# Patient Record
Sex: Male | Born: 1955
Health system: Southern US, Community
[De-identification: ages and names within clinical notes are randomized; demographics above are authoritative.]

## PROBLEM LIST (undated history)

## (undated) DIAGNOSIS — Z9119 Patient's noncompliance with other medical treatment and regimen: Secondary | ICD-10-CM

## (undated) DIAGNOSIS — Z91199 Patient's noncompliance with other medical treatment and regimen due to unspecified reason: Secondary | ICD-10-CM

## (undated) DIAGNOSIS — M1990 Unspecified osteoarthritis, unspecified site: Secondary | ICD-10-CM

## (undated) DIAGNOSIS — I10 Essential (primary) hypertension: Secondary | ICD-10-CM

## (undated) DIAGNOSIS — K648 Other hemorrhoids: Secondary | ICD-10-CM

## (undated) DIAGNOSIS — I2510 Atherosclerotic heart disease of native coronary artery without angina pectoris: Secondary | ICD-10-CM

## (undated) DIAGNOSIS — E119 Type 2 diabetes mellitus without complications: Secondary | ICD-10-CM

## (undated) DIAGNOSIS — H269 Unspecified cataract: Secondary | ICD-10-CM

## (undated) DIAGNOSIS — E7800 Pure hypercholesterolemia, unspecified: Secondary | ICD-10-CM

## (undated) DIAGNOSIS — H409 Unspecified glaucoma: Secondary | ICD-10-CM

## (undated) HISTORY — DX: Unspecified osteoarthritis, unspecified site: M19.90

## (undated) HISTORY — DX: Unspecified cataract: H26.9

## (undated) HISTORY — PX: EYE SURGERY: SHX253

## (undated) HISTORY — DX: Atherosclerotic heart disease of native coronary artery without angina pectoris: I25.10

---

## 1988-08-18 DIAGNOSIS — E119 Type 2 diabetes mellitus without complications: Secondary | ICD-10-CM

## 1988-08-18 HISTORY — DX: Type 2 diabetes mellitus without complications: E11.9

## 1998-03-16 ENCOUNTER — Other Ambulatory Visit: Admission: RE | Admit: 1998-03-16 | Discharge: 1998-03-16 | Payer: Self-pay | Admitting: Family Medicine

## 1998-07-04 ENCOUNTER — Ambulatory Visit (HOSPITAL_COMMUNITY): Admission: RE | Admit: 1998-07-04 | Discharge: 1998-07-04 | Payer: Self-pay | Admitting: Family Medicine

## 1998-07-04 ENCOUNTER — Encounter: Payer: Self-pay | Admitting: Family Medicine

## 1998-08-18 HISTORY — PX: COLONOSCOPY: SHX174

## 1999-07-30 ENCOUNTER — Ambulatory Visit (HOSPITAL_COMMUNITY): Admission: RE | Admit: 1999-07-30 | Discharge: 1999-07-30 | Payer: Self-pay | Admitting: Gastroenterology

## 2000-03-27 ENCOUNTER — Emergency Department (HOSPITAL_COMMUNITY): Admission: EM | Admit: 2000-03-27 | Discharge: 2000-03-27 | Payer: Self-pay | Admitting: Emergency Medicine

## 2001-04-29 ENCOUNTER — Emergency Department (HOSPITAL_COMMUNITY): Admission: EM | Admit: 2001-04-29 | Discharge: 2001-04-29 | Payer: Self-pay | Admitting: Emergency Medicine

## 2003-03-31 ENCOUNTER — Encounter: Payer: Self-pay | Admitting: Internal Medicine

## 2003-03-31 ENCOUNTER — Ambulatory Visit (HOSPITAL_COMMUNITY): Admission: RE | Admit: 2003-03-31 | Discharge: 2003-03-31 | Payer: Self-pay | Admitting: Internal Medicine

## 2011-07-17 ENCOUNTER — Inpatient Hospital Stay (HOSPITAL_COMMUNITY)
Admission: EM | Admit: 2011-07-17 | Discharge: 2011-07-20 | DRG: 853 | Disposition: A | Discharge status: Home / Self Care | Payer: BC Managed Care – PPO | Attending: Cardiovascular Disease | Admitting: Cardiovascular Disease

## 2011-07-17 ENCOUNTER — Encounter: Payer: Self-pay | Admitting: *Deleted

## 2011-07-17 ENCOUNTER — Emergency Department (HOSPITAL_COMMUNITY): Payer: BC Managed Care – PPO

## 2011-07-17 DIAGNOSIS — Z8249 Family history of ischemic heart disease and other diseases of the circulatory system: Secondary | ICD-10-CM

## 2011-07-17 DIAGNOSIS — Z91199 Patient's noncompliance with other medical treatment and regimen due to unspecified reason: Secondary | ICD-10-CM

## 2011-07-17 DIAGNOSIS — I25118 Atherosclerotic heart disease of native coronary artery with other forms of angina pectoris: Secondary | ICD-10-CM | POA: Diagnosis present

## 2011-07-17 DIAGNOSIS — E1169 Type 2 diabetes mellitus with other specified complication: Secondary | ICD-10-CM | POA: Diagnosis present

## 2011-07-17 DIAGNOSIS — R739 Hyperglycemia, unspecified: Secondary | ICD-10-CM

## 2011-07-17 DIAGNOSIS — I200 Unstable angina: Secondary | ICD-10-CM

## 2011-07-17 DIAGNOSIS — R079 Chest pain, unspecified: Secondary | ICD-10-CM

## 2011-07-17 DIAGNOSIS — R599 Enlarged lymph nodes, unspecified: Secondary | ICD-10-CM | POA: Diagnosis present

## 2011-07-17 DIAGNOSIS — E785 Hyperlipidemia, unspecified: Secondary | ICD-10-CM | POA: Diagnosis present

## 2011-07-17 DIAGNOSIS — I10 Essential (primary) hypertension: Secondary | ICD-10-CM | POA: Diagnosis present

## 2011-07-17 DIAGNOSIS — R000 Tachycardia, unspecified: Secondary | ICD-10-CM | POA: Diagnosis present

## 2011-07-17 DIAGNOSIS — Z9119 Patient's noncompliance with other medical treatment and regimen: Secondary | ICD-10-CM

## 2011-07-17 DIAGNOSIS — I214 Non-ST elevation (NSTEMI) myocardial infarction: Secondary | ICD-10-CM | POA: Diagnosis present

## 2011-07-17 DIAGNOSIS — E118 Type 2 diabetes mellitus with unspecified complications: Secondary | ICD-10-CM | POA: Diagnosis present

## 2011-07-17 DIAGNOSIS — Z7982 Long term (current) use of aspirin: Secondary | ICD-10-CM

## 2011-07-17 DIAGNOSIS — E119 Type 2 diabetes mellitus without complications: Secondary | ICD-10-CM | POA: Diagnosis present

## 2011-07-17 DIAGNOSIS — I2510 Atherosclerotic heart disease of native coronary artery without angina pectoris: Secondary | ICD-10-CM | POA: Diagnosis present

## 2011-07-17 DIAGNOSIS — E7800 Pure hypercholesterolemia, unspecified: Secondary | ICD-10-CM | POA: Diagnosis present

## 2011-07-17 DIAGNOSIS — Z794 Long term (current) use of insulin: Secondary | ICD-10-CM | POA: Diagnosis present

## 2011-07-17 HISTORY — DX: Type 2 diabetes mellitus without complications: E11.9

## 2011-07-17 HISTORY — DX: Pure hypercholesterolemia, unspecified: E78.00

## 2011-07-17 HISTORY — DX: Essential (primary) hypertension: I10

## 2011-07-17 HISTORY — DX: Patient's noncompliance with other medical treatment and regimen due to unspecified reason: Z91.199

## 2011-07-17 HISTORY — DX: Other hemorrhoids: K64.8

## 2011-07-17 HISTORY — DX: Patient's noncompliance with other medical treatment and regimen: Z91.19

## 2011-07-17 LAB — CBC
Hemoglobin: 15.4 g/dL (ref 13.0–17.0)
Hemoglobin: 14.9 g/dL (ref 13.0–17.0)
MCH: 29.1 pg (ref 26.0–34.0)
MCH: 29.7 pg (ref 26.0–34.0)
MCV: 82.1 fL (ref 78.0–100.0)
RBC: 5.3 MIL/uL (ref 4.22–5.81)
RBC: 5.02 MIL/uL (ref 4.22–5.81)
WBC: 6.1 10*3/uL (ref 4.0–10.5)

## 2011-07-17 LAB — URINALYSIS, ROUTINE W REFLEX MICROSCOPIC
Glucose, UA: >1000 mg/dL — AB
Ketones, ur: 40 mg/dL — AB
Leukocytes, UA: NEGATIVE
Nitrite: NEGATIVE
Protein, ur: NEGATIVE mg/dL

## 2011-07-17 LAB — CARDIAC PANEL(CRET KIN+CKTOT+MB+TROPI)
CK, MB: 3.8 ng/mL (ref 0.3–4.0)
CK, MB: 3.5 ng/mL (ref 0.3–4.0)
Total CK: 52 U/L (ref 7–232)

## 2011-07-17 LAB — POCT I-STAT, CHEM 8
BUN: 13 mg/dL (ref 6–23)
Calcium, Ion: 1.1 mmol/L — ABNORMAL LOW (ref 1.12–1.32)
HCT: 49 % (ref 39.0–52.0)
Hemoglobin: 16.7 g/dL (ref 13.0–17.0)
Sodium: 133 mEq/L — ABNORMAL LOW (ref 135–145)
TCO2: 25 mmol/L (ref 0–100)

## 2011-07-17 LAB — GLUCOSE, CAPILLARY: Glucose-Capillary: 298 mg/dL — ABNORMAL HIGH (ref 70–99)

## 2011-07-17 LAB — POCT I-STAT TROPONIN I: Troponin i, poc: 0.03 ng/mL (ref 0.00–0.08)

## 2011-07-17 LAB — CREATININE, SERUM: Creatinine, Ser: 0.64 mg/dL (ref 0.50–1.35)

## 2011-07-17 LAB — PROTIME-INR: Prothrombin Time: 13.2 seconds (ref 11.6–15.2)

## 2011-07-17 LAB — APTT: aPTT: 28 seconds (ref 24–37)

## 2011-07-17 MED ORDER — NITROGLYCERIN 0.4 MG SL SUBL
0.4000 mg | SUBLINGUAL_TABLET | SUBLINGUAL | Status: DC | PRN
  Administered 2011-07-17: 0.4 mg via SUBLINGUAL
  Filled 2011-07-17: qty 75

## 2011-07-17 MED ORDER — INSULIN ASPART 100 UNIT/ML ~~LOC~~ SOLN
10.0000 [IU] | Freq: Once | SUBCUTANEOUS | Status: AC
  Administered 2011-07-17: 10 [IU] via SUBCUTANEOUS
  Filled 2011-07-17: qty 1

## 2011-07-17 MED ORDER — SODIUM CHLORIDE 0.9 % IV SOLN: INTRAVENOUS | Status: AC

## 2011-07-17 MED ORDER — MORPHINE SULFATE 2 MG/ML IJ SOLN
2.0000 mg | Freq: Once | INTRAMUSCULAR | Status: AC
  Administered 2011-07-17: 2 mg via INTRAVENOUS
  Filled 2011-07-17: qty 1

## 2011-07-17 MED ORDER — SODIUM CHLORIDE 0.9 % IJ SOLN: 3.0000 mL | Freq: Two times a day (BID) | INTRAMUSCULAR | Status: DC

## 2011-07-17 MED ORDER — SODIUM CHLORIDE 0.9 % IJ SOLN
3.0000 mL | INTRAMUSCULAR | Status: DC | PRN
  Administered 2011-07-17: 3 mL via INTRAVENOUS

## 2011-07-17 MED ORDER — MORPHINE SULFATE 2 MG/ML IJ SOLN
1.0000 mg | INTRAMUSCULAR | Status: DC | PRN
  Administered 2011-07-17: 1 mg via INTRAVENOUS
  Filled 2011-07-17: qty 1

## 2011-07-17 MED ORDER — ASPIRIN 81 MG PO CHEW
324.0000 mg | CHEWABLE_TABLET | Freq: Once | ORAL | Status: AC
  Administered 2011-07-17: 324 mg via ORAL
  Filled 2011-07-17: qty 4

## 2011-07-17 MED ORDER — INSULIN REGULAR HUMAN 100 UNIT/ML IJ SOLN
10.0000 [IU] | Freq: Once | INTRAMUSCULAR | Status: DC
  Filled 2011-07-17: qty 0.1

## 2011-07-17 MED ORDER — NITROGLYCERIN 2 % TD OINT
0.5000 [in_us] | TOPICAL_OINTMENT | Freq: Four times a day (QID) | TRANSDERMAL | Status: DC
  Administered 2011-07-17 – 2011-07-18 (×2): 0.5 [in_us] via TOPICAL
  Filled 2011-07-17: qty 1

## 2011-07-17 MED ORDER — INSULIN ASPART 100 UNIT/ML ~~LOC~~ SOLN
0.0000 [IU] | SUBCUTANEOUS | Status: DC
  Administered 2011-07-17: 3 [IU] via SUBCUTANEOUS
  Administered 2011-07-18: 5 [IU] via SUBCUTANEOUS
  Administered 2011-07-18: 3 [IU] via SUBCUTANEOUS

## 2011-07-17 MED ORDER — SODIUM CHLORIDE 0.9 % IV BOLUS (SEPSIS)
1000.0000 mL | Freq: Once | INTRAVENOUS | Status: AC
  Administered 2011-07-18: 1000 mL via INTRAVENOUS

## 2011-07-17 MED ORDER — ENOXAPARIN SODIUM 60 MG/0.6ML ~~LOC~~ SOLN
50.0000 mg | SUBCUTANEOUS | Status: DC
  Administered 2011-07-17: 50 mg via SUBCUTANEOUS
  Filled 2011-07-17 (×3): qty 0.6

## 2011-07-17 MED ORDER — ENOXAPARIN SODIUM 40 MG/0.4ML ~~LOC~~ SOLN: 40.0000 mg | SUBCUTANEOUS | Status: DC

## 2011-07-17 MED ORDER — SODIUM CHLORIDE 0.9 % IV SOLN: 250.0000 mL | INTRAVENOUS | Status: DC | PRN

## 2011-07-17 NOTE — ED Notes (Addendum)
B/P 131/84  HR 106  R 20  Pulse ox 99% on room air---sitting on end of bed talking with family members---Reports  Mild chest pain and rates it a 2 on 1-10 scale.  3rd NTG given

## 2011-07-17 NOTE — ED Notes (Signed)
B/P 170/102  HR  104  Rates chest pain a three on 1-10 scale

## 2011-07-17 NOTE — ED Notes (Signed)
Pt states "it goes across my chest, it comes & goes, goes up under my arm and into my neck"; pt indicates left arm

## 2011-07-17 NOTE — ED Notes (Signed)
B/P 115/94  HR 117  R 22  Pulse ox 96%--Again, reports complete resolution of chest pain

## 2011-07-17 NOTE — H&P (Signed)
Derek Lowery is an 55 y.o. male.   Chief Complaint: Chest Pain HPI:  Was working this morning at his Job at [employer] ([employer]) and started having pain. Started having the pain after an hour of work-Pain started in the chest "all across the chest" was a dull pain, was intermittent-would come all at once And then would ease Up. Didn't stop working and kept on having the pain. Worked through the pain from 04:00-->08:00. Pain was 7/10 in intensity. Went home and played [employer]. The pain did not seem to get better with rest at home  Tried to lay down and was still having pain-no position made it better  Pain radiated up into his neck/jaw and seemed also to go down his arm  Eating didn't make this worse, didn't make it better.  No syncope--no diaphoresis when he was having the pain  The pain seemed to be more intense and was lasting longer and was more and more  ROS=Polydipsia, Polyuric--blurred vision  Started meds in 1995  No weakness no slurred speech, no loc, no dysuria, no cough or cold, no recent h/o long distance travel. Has some polyneuropathy        Past Medical History  Diagnosis Date  . Hypercholesteremia     was taking meds-stopped 2 yr ago  . Diabetes mellitus 1990    non-compliant on meds--last check about 1 yr  . Chest pain radiating to jaw 11.29.12  . Angina     Past Surgical History  Procedure Date  . Shoulder surgery   . Colonoscopy 2000    Dr. Collene Mares found internal hemorrhoids    Family History  Problem Relation Age of Onset  . Coronary artery disease Father   . Kidney disease Father   . Diabetes Father   . Heart failure Mother   . Hypertension Mother   . Diabetes Sister   . Diabetes Brother   . Melanoma Father    Social History:  reports that he has never smoked. He does not have any smokeless tobacco history on file. He reports that he does not drink alcohol or use illicit drugs.  Allergies: No Known Allergies  Medications Prior to Admission    Medication Dose Route Frequency Provider Last Rate Last Dose  . sodium chloride 0.9 % injection 3 mL  3 mL Intravenous Q12H Charlena Cross, MD       And  . sodium chloride 0.9 % injection 3 mL  3 mL Intravenous PRN Charlena Cross, MD   3 mL at 07/17/11 1607   And  . 0.9 %  sodium chloride infusion  250 mL Intravenous PRN Charlena Cross, MD      . 0.9 %  sodium chloride infusion   Intravenous Continuous Verneita Griffes, MD      . aspirin chewable tablet 324 mg  324 mg Oral Once Charlena Cross, MD   324 mg at 07/17/11 1541  . insulin aspart (novoLOG) injection 10 Units  10 Units Subcutaneous Once Lolita Patella, [place]   10 Units at 07/17/11 1618  . morphine 2 MG/ML injection 2 mg  2 mg Intravenous Once Charlena Cross, MD   2 mg at 07/17/11 1616  . nitroGLYCERIN (NITROSTAT) SL tablet 0.4 mg  0.4 mg Sublingual Q5 min PRN Charlena Cross, MD   0.4 mg at 07/17/11 1608  . sodium chloride 0.9 % bolus 1,000 mL  1,000 mL Intravenous Once Charlena Cross, MD      .  DISCONTD: insulin regular (HUMULIN R,NOVOLIN R) 100 units/mL injection 10 Units  10 Units Subcutaneous Once Charlena Cross, MD       No current outpatient prescriptions on file as of 07/17/2011.    Results for orders placed during the hospital encounter of 07/17/11 (from the past 48 hour(s))  GLUCOSE, CAPILLARY     Status: Abnormal   Collection Time   07/17/11  2:25 PM      Component Value Range Comment   Glucose-Capillary 514 (*) 70 - 99 (mg/dL)    Comment 1 Documented in Chart      Comment 2 Notify RN     CBC     Status: Normal   Collection Time   07/17/11  3:30 PM      Component Value Range Comment   WBC 6.1  4.0 - 10.5 (K/uL)    RBC 5.30  4.22 - 5.81 (MIL/uL)    Hemoglobin 15.4  13.0 - 17.0 (g/dL)    HCT 43.5  39.0 - 52.0 (%)    MCV 82.1  78.0 - 100.0 (fL)    MCH 29.1  26.0 - 34.0 (pg)    MCHC 35.4  30.0 - 36.0 (g/dL)    RDW 12.4  11.5 - 15.5 (%)    Platelets 168  150 - 400 (K/uL)   PROTIME-INR      Status: Normal   Collection Time   07/17/11  3:30 PM      Component Value Range Comment   Prothrombin Time 13.2  11.6 - 15.2 (seconds)    INR 0.98  0.00 - 1.49    APTT     Status: Normal   Collection Time   07/17/11  3:30 PM      Component Value Range Comment   aPTT 28  24 - 37 (seconds)   POCT I-STAT TROPONIN I     Status: Normal   Collection Time   07/17/11  3:40 PM      Component Value Range Comment   Troponin i, poc 0.03  0.00 - 0.08 (ng/mL)    Comment 3            POCT I-STAT, CHEM 8     Status: Abnormal   Collection Time   07/17/11  3:42 PM      Component Value Range Comment   Sodium 133 (*) 135 - 145 (mEq/L)    Potassium 4.5  3.5 - 5.1 (mEq/L)    Chloride 98  96 - 112 (mEq/L)    BUN 13  6 - 23 (mg/dL)    Creatinine, Ser 0.90  0.50 - 1.35 (mg/dL)    Glucose, Bld 481 (*) 70 - 99 (mg/dL)    Calcium, Ion 1.10 (*) 1.12 - 1.32 (mmol/L)    TCO2 25  0 - 100 (mmol/L)    Hemoglobin 16.7  13.0 - 17.0 (g/dL)    HCT 49.0  39.0 - 52.0 (%)   URINALYSIS, ROUTINE W REFLEX MICROSCOPIC     Status: Abnormal   Collection Time   07/17/11  4:25 PM      Component Value Range Comment   Color, Urine YELLOW  YELLOW     APPearance CLEAR  CLEAR     Specific Gravity, Urine 1.029  1.005 - 1.030     pH 7.0  5.0 - 8.0     Glucose, UA >1000 (*) NEGATIVE (mg/dL)    Hgb urine dipstick NEGATIVE  NEGATIVE     Bilirubin Urine NEGATIVE  NEGATIVE  Ketones, ur 40 (*) NEGATIVE (mg/dL)    Protein, ur NEGATIVE  NEGATIVE (mg/dL)    Urobilinogen, UA 0.2  0.0 - 1.0 (mg/dL)    Nitrite NEGATIVE  NEGATIVE     Leukocytes, UA NEGATIVE  NEGATIVE    URINE MICROSCOPIC-ADD ON     Status: Normal   Collection Time   07/17/11  4:25 PM      Component Value Range Comment   Squamous Epithelial / LPF RARE  RARE     Dg Chest Portable 1 View  07/17/2011  *RADIOLOGY REPORT*  Clinical Data: Chest pain  PORTABLE CHEST - 1 VIEW  Comparison: None.  Findings: Normal heart size.  Clear lungs. No pneumothorax.  IMPRESSION:  No active cardiopulmonary disease.  Original Report Authenticated By: Jamas Lav, M.D.    Review of Systems  Constitutional: Negative for fever, weight loss and diaphoresis.  HENT: Negative.   Eyes: Blurred vision: has had this since he has been out of insulin.  Respiratory: Negative for cough, hemoptysis and shortness of breath.   Cardiovascular: Negative for palpitations, orthopnea, claudication, leg swelling and PND. Chest pain: see hpi.  Gastrointestinal: Negative.   Genitourinary: Negative.   Musculoskeletal: Negative.   Neurological: Positive for sensory change (has polyneuropathyic pain lower extremities). Negative for dizziness, tingling, focal weakness, seizures, loss of consciousness and weakness.  Psychiatric/Behavioral: Negative.   All other systems reviewed and are negative.    Blood pressure 115/94, pulse 107, temperature 97.8 F (36.6 C), temperature source Oral, resp. rate 17, weight 99.791 kg (220 lb), SpO2 98.00%. Physical Exam  Nursing note and vitals reviewed. Constitutional: He is oriented to person, place, and time. He appears well-developed and well-nourished. No distress.  Eyes: Pupils are equal, round, and reactive to light.  Neck: Normal range of motion. No JVD present. No tracheal deviation present. No thyromegaly present.  Cardiovascular:       Tachycardic with Reg rate-Tele reviewed, All 3 ekg's reviewed  Respiratory: Effort normal. No respiratory distress. He has rales (mild basilar rales).  GI: Soft. Bowel sounds are normal. He exhibits no distension. There is no rebound and no guarding.  Genitourinary:       deferred  Musculoskeletal: Normal range of motion.  Lymphadenopathy:    He has cervical adenopathy.  Neurological: He is alert and oriented to person, place, and time. He has normal reflexes.  Skin: Skin is warm and dry. He is not diaphoretic.  Psychiatric: He has a normal mood and affect. His behavior is normal. Judgment and thought content  normal.     Assessment/Plan Patient Active Hospital Problem List: Chest pain radiating to jaw (07/17/2011)   Assessment: Will r/o by 3 sets of CE's-Get ECHo-given his relief of CP with Nitro and non-reporducibility of CP, would treat this as Angina at present.  EKg's serially done and review with possible Sinus Tach in 100's and 1st Degree AVB, Incomplete RBB, no overt St-T changes Requested ED to please order CE's x 3, and will get ECHO-cardiogram to assess LV function Cardiologist (Brenda) Dr. Percival Spanish aware of admission and will consult-assistance appreciated in advance  DM (diabetes mellitus) (07/17/2011)   Assessment: Non-compliant with Insulin (lantus) x 6 mo, and with Oral hypoglycemics (janu-met) x 2 mo.  Needs Close follow up-Will ask Diabetic coordinator/nutritionist to see.  Pt reports "I eat anything"   HLD (hyperlipidemia) (07/17/2011)   Assessment: Fasting lipid panel am for Risk-stratification, given he hasn;t seen Dr. Karlton Lemon in 2 years almost  Tachycardia-Multifactorial-could be 2/2 to vol  depletion vs Cardiogenic--Will Keep on IVF x 4 hours to help with this.  Would review clinically by [facility] on-call later tonight ( I will inform my partner to eyeball him) and will decide dispo based on CE's and clinical picture  >65 minutes spent coordinating admiss    I have fully updated family as to the course of their family members care and have answered all questions that they had      [facility] 07/17/2011, 5:59 PM

## 2011-07-17 NOTE — Progress Notes (Addendum)
Derek Lowery WVP:[id number] is a 55 y.o. male,  Outpatient Primary MD for the patient is No primary provider on file.  Chief Complaint  Patient presents with  . Chest Pain        Subjective:   Derek Lowery today has, No headache, 2/10 chest pain, No abdominal pain - No Nausea, No new weakness tingling or numbness, No Cough - SOB.    Objective:   Filed Vitals:   07/17/11 1411 07/17/11 1759 07/17/11 1922  BP: 159/97 115/94 159/93  Pulse: 113 107  92  Temp: 97.8 F (36.6 C)    TempSrc: Oral    Resp: 17  18  Weight: 99.791 kg (220 lb)    SpO2: 98% 98% 100%    Wt Readings from Last 3 Encounters:  07/17/11 99.791 kg (220 lb)    No intake or output data in the 24 hours ending 07/17/11 2238  Exam Awake Alert, Oriented *3, No new F.N deficits, Normal affect [place].AT,PERRAL Supple Neck,No JVD, No cervical lymphadenopathy appriciated.  Symmetrical Chest wall movement, Good air movement bilaterally, CTAB RRR,No Gallops,Rubs or new Murmurs, No Parasternal Heave +ve B.Sounds, Abd Soft, Non tender, No organomegaly appriciated, No rebound -guarding or rigidity. No Cyanosis, Clubbing or edema, No new Rash or bruise    Data Review  CBC  Lab 07/17/11 1825 07/17/11 1542 07/17/11 1530  WBC 6.8 -- 6.1  HGB 14.9 16.7 15.4  HCT 41.7 49.0 43.5  PLT 183 -- 168  MCV 83.1 -- 82.1  MCH 29.7 -- 29.1  MCHC 35.7 -- 35.4  RDW 12.5 -- 12.4  LYMPHSABS -- -- --  MONOABS -- -- --  EOSABS -- -- --  BASOSABS -- -- --  BANDABS -- -- --    Chemistries   Lab 07/17/11 1825 07/17/11 1542  NA -- 133*  K -- 4.5  CL -- 98  CO2 -- --  GLUCOSE -- 481*  BUN -- 13  CREATININE 0.64 0.90  CALCIUM -- --  MG -- --  AST -- --  ALT -- --  ALKPHOS -- --  BILITOT -- --   ------------------------------------------------------------------------------------------------------------------ CrCl is unknown because there is no height on file for the current  visit. ------------------------------------------------------------------------------------------------------------------ No results found for this basename: HGBA1C:2 in the last 72 hours ------------------------------------------------------------------------------------------------------------------ No results found for this basename: CHOL:2,HDL:2,LDLCALC:2,TRIG:2,CHOLHDL:2,LDLDIRECT:2 in the last 72 hours ------------------------------------------------------------------------------------------------------------------ No results found for this basename: TSH,T4TOTAL,FREET3,T3FREE,THYROIDAB in the last 72 hours ------------------------------------------------------------------------------------------------------------------ No results found for this basename: VITAMINB12:2,FOLATE:2,FERRITIN:2,TIBC:2,IRON:2,RETICCTPCT:2 in the last 72 hours  Coagulation profile  Lab 07/17/11 1530  INR 0.98  PROTIME --    No results found for this basename: DDIMER:2 in the last 72 hours  Cardiac Enzymes  Lab 07/17/11 1825 07/17/11 1540  CKMB 3.5 3.8  TROPONINI <0.30 <0.30  MYOGLOBIN -- --   ------------------------------------------------------------------------------------------------------------------ No results found for this basename: POCBNP:3 in the last 168 hours  Urine Studies No results found for this basename: UACOL:2,UAPR:2,USPG:2,UPH:2,UTP:2,UGL:2,UKET:2,UBIL:2,UHGB:2,UNIT:2,UROB:2,ULEU:2,UEPI:2,UWBC:2,URBC:2,UBAC:2,CAST:2,CRYS:2,UCOM:2,BILUA:2 in the last 72 hours  Micro Results No results found for this or any previous visit (from the past 240 hour(s)).  Radiology Reports Dg Chest Portable 1 View  07/17/2011  *RADIOLOGY REPORT*  Clinical Data: Chest pain  PORTABLE CHEST - 1 VIEW  Comparison: None.  Findings: Normal heart size.  Clear lungs. No pneumothorax.  IMPRESSION: No active cardiopulmonary disease.  Original Report Authenticated By: Jamas Lav, M.D.    Scheduled Meds:    . aspirin  324 mg Oral Once  . enoxaparin (LOVENOX) injection  50  mg Subcutaneous Q24H  . insulin aspart  0-9 Units Subcutaneous Q4H  . insulin aspart  10 Units Subcutaneous Once  .  morphine injection  2 mg Intravenous Once  . nitroGLYCERIN  0.5 inch Topical Q6H  . sodium chloride  1,000 mL Intravenous Once  . DISCONTD: enoxaparin  40 mg Subcutaneous Q24H  . DISCONTD: insulin regular  10 Units Subcutaneous Once  . DISCONTD: sodium chloride  3 mL Intravenous Q12H   Continuous Infusions:   . sodium chloride     PRN Meds:.morphine, nitroGLYCERIN, DISCONTD: sodium chloride, DISCONTD: sodium chloride  Assessment & Plan  Principal Problem:  *Chest pain radiating to jaw Active Problems:  DM (diabetes mellitus)  HLD (hyperlipidemia)  Tachycardia-multifactorial  Unstable angina  Essential hypertension, benign  On Call note  Came to re eval pt - Ch pain now 2/10, like [place] - no radiation now, no SOB, says feels 90% better.  Apply 1/2 inch NTG paste to get him pain free, gentle IVF for 12 hrs, Trop -ve x 2 , added B blocker and Statin, is on ASA and Lovenox, Cards following, Likely cath in am.

## 2011-07-17 NOTE — ED Notes (Signed)
Bedside glucose 298

## 2011-07-17 NOTE — ED Notes (Signed)
MD at bedside. 

## 2011-07-17 NOTE — ED Notes (Signed)
1 liter normal saline bolus has infused--b/p 131/84  HR  103  R 20  Pulse ox 98% on room air  Continues to report complete resolution of chest pain after 2 NTG

## 2011-07-17 NOTE — Consult Note (Signed)
Clinical Summary [name] is a 55 y.o.male presenting to the ER with recent onset chest pain. 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 While working this morning he began to experience a "dull" chest pain that waxed and waned the entire time that he was there He went home, sat at his computer, and continued to have intermittent symptoms with radiation to the jaw. He reported no sense of diaphoresis, shortness of breath, or palpitations. In the middle of the afternoon he presented for further assessment.  He has continued to have intermittent symptoms in the ER, although generally mild and brief. ECG reviewed below, shows no acute ST segment changes and his initial cardiac markers are normal. By telemetry, he has had frequent ventricular ectopy.  He has not had any regular medical followup for the last few years, on no regular medications for hyperlipidemia, type 2 diabetes mellitus, or hypertension. Glucose was 514 at presentation.  Patient reports no previous cardiac testing. He states that his father developed CAD in his 72s.  No Known Allergies  Medications    . aspirin  324 mg Oral Once  . enoxaparin (LOVENOX) injection  50 mg Subcutaneous Q24H  . insulin aspart  10 Units Subcutaneous Once  .  morphine injection  2 mg Intravenous Once  . sodium chloride  1,000 mL Intravenous Once  . sodium chloride  3 mL Intravenous Q12H  . DISCONTD: enoxaparin  40 mg Subcutaneous Q24H  . DISCONTD: insulin regular  10 Units Subcutaneous Once    Past Medical History  Diagnosis Date  . Hypercholesteremia   . Type 2 diabetes mellitus 1990  . Internal hemorrhoids   . Essential hypertension, benign   . Noncompliance     Past Surgical History  Procedure Date  . Shoulder surgery   . Colonoscopy 2000    Dr. Collene Mares    Family History  Problem Relation Age of Onset  . Coronary artery disease Father     Developed in his 69s  . Kidney disease Father   . Diabetes Father   .  Heart failure Mother   . Hypertension Mother   . Diabetes Sister   . Diabetes Brother   . Melanoma Father     Social History [name] reports that he has never smoked. He has never used smokeless tobacco. [name] reports that he does not drink alcohol.  Review of Systems No recent exertional chest pain. No palpitations or syncope. No reported bleeding problems. No claudication. No recent fevers or chills, no cough. Otherwise reviewed and negative.  Physical Examination Temp:  [97.8 F (36.6 C)] 97.8 F (36.6 C) (11/29 1411) Pulse Rate:  [107-113] 107  (11/29 1759) Resp:  [17-18] 18  (11/29 1922) BP: (115-159)/(93-97) 159/93 mmHg (11/29 1922) SpO2:  [98 %-100 %] 100 % (11/29 1922) Weight:  [220 lb (99.791 kg)] 220 lb (99.791 kg) (11/29 1411)  Obese male in no acute distress. HEENT: Conjunctiva and lids normal, oropharynx clear. Neck: Supple, no elevated JVP or carotid bruits, no thyromegaly. Lungs: Clear to auscultation, diminished at the bases, no rales. Cardiac: Regular rate and rhythm, soft systolic murmur, soft S3, no rub. Abdomen: Obese, bowel sounds present, no guarding. Skin: Warm and dry. Extremities: No pitting edema, distal pulses diminished. Neuropsychiatric: Alert and oriented x3, affect appropriate.  Testing Lab Results  Component Value Date   WBC 6.8 07/17/2011   HGB 14.9 07/17/2011   HCT 41.7 07/17/2011   MCV 83.1 07/17/2011  PLT 183 07/17/2011    Lab Results  Component Value Date   CREATININE 0.64 07/17/2011   BUN 13 07/17/2011   NA 133* 07/17/2011   K 4.5 07/17/2011   CL 98 07/17/2011     Lab Results  Component Value Date   CKTOTAL 52 07/17/2011   CKMB 3.5 07/17/2011   TROPONINI <0.30 07/17/2011     ECG Sinus tachycardia with left axis, incomplete right bundle-branch block, PVC  Imaging Chest x-ray 07/17/2011: No active cardiac pulmonary disease.  Impression  1. Symptoms consistent with unstable angina, ECG nonspecific with  ventricular ectopy by telemetry, initial cardiac markers normal. Symptom onset was early this morning while working outdoors in the cold.  2. History of type 2 diabetes mellitus complicated by medication and followup noncompliance, glucose over 500 at presentation.  3. Family history of premature cardiovascular disease, father with CAD in his 97s.  4. History of hyperlipidemia, no regular medical therapy.  5. History of hypertension, no regular medical therapy.   Recommendations  Patient being admitted to the hospitalist service. Would continue aspirin, convert to treatment dose Lovenox per pharmacy, initiate beta blocker therapy, and initiate statin therapy. Labs for the morning to include fasting lipid profile and hemoglobin A1c in addition to BMET. I discussed the situation with the patient, and would recommend a diagnostic cardiac catheterization to best understand his coronary anatomy in light of symptoms and significant risk factors for CAD. We reviewed the risks and benefits, and he is in agreement to proceed. With the help of hospitalist team, hopefully glucose can be better controlled with insulin regimen, and medical therapy can be uptitrated as needed. I explained to [name], that it will be imperative for him to maintain regular medical followup for management of his chronic illnesses over time, particularly if obstructive CAD requiring revascularization is diagnosed. The [facility] cardiology team will continue to follow patient during his stay and assist with his management.  Satira Sark, M.D., F.A.C.C.

## 2011-07-17 NOTE — ED Provider Notes (Addendum)
History     CSN: [id number] Arrival date & time: 07/17/2011  1:58 PM   First MD Initiated Contact with Patient 07/17/11 1511      Chief Complaint  Patient presents with  . Chest Pain    (Consider location/radiation/quality/duration/timing/severity/associated sxs/prior treatment) HPI Comments: The patient is a 55 year old male with a history of diabetes, hyperlipidemia, and family history of early coronary artery disease, but no personal history of coronary artery disease or myocardial infarction, who presents for evaluation of chest pain. 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 He says the pain comes and goes, but is more constant than not, then clarifies that the pain will come for about 1 minute, BV for about 1 minute, return for a couple minutes etc. He rates the pain as 7/10 in severity and localizes it to the bilateral anterior chest, and reports radiation to the jaw and to the shoulders, and describes it is nonexertional, not worsened with exertion, and reports no aggravating or alleviating factors. He denies ever having chest pain such as this before. He does not smoke and is not diagnosed as having hypertension. He does have diabetes as mentioned and reports that he has not been taking his statin for his insulin for the last 6 months. His blood sugar on presentation is over 500. He appears to be in no acute distress with no labored breathing, no diaphoresis.  Patient is a 55 y.o. male presenting with chest pain. The history is provided by the patient.  Chest Pain The chest pain began 6 - 12 hours ago. Duration of episode(s) is 10 hours. Chest pain occurs intermittently. The chest pain is unchanged. Associated with: Nothing. At its most intense, the pain is at 7/10. The pain is currently at 7/10. The severity of the pain is moderate. The quality of the pain is described as aching. The pain radiates to the left jaw, left shoulder, right jaw and  right shoulder. Exacerbated by: Nothing. Pertinent negatives for primary symptoms include no fever, no fatigue, no syncope, no shortness of breath, no cough, no wheezing, no palpitations, no abdominal pain, no nausea, no vomiting, no dizziness and no altered mental status.  Pertinent negatives for associated symptoms include no claudication, no diaphoresis, no lower extremity edema, no near-syncope, no numbness, no orthopnea, no paroxysmal nocturnal dyspnea and no weakness. He tried nothing for the symptoms. Risk factors include being elderly, lack of exercise and male gender.  His past medical history is significant for diabetes and hyperlipidemia.  Pertinent negatives for past medical history include no CAD and no hypertension.  His family medical history is significant for CAD in family.  Procedure history is negative for cardiac catheterization, echocardiogram, persantine thallium, stress echo, stress thallium and exercise treadmill test.     Past Medical History  Diagnosis Date  . Hypercholesteremia   . Diabetes mellitus     History reviewed. No pertinent past surgical history.  No family history on file.  History  Substance Use Topics  . Smoking status: Never Smoker   . Smokeless tobacco: Not on file  . Alcohol Use: No      Review of Systems  Constitutional: Negative for fever, diaphoresis and fatigue.  HENT: Negative.   Eyes: Negative.   Respiratory: Negative for cough, shortness of breath and wheezing.   Cardiovascular: Positive for chest pain. Negative for palpitations, orthopnea, claudication, leg swelling, syncope and near-syncope.  Gastrointestinal: Negative for nausea, vomiting and abdominal pain.  Musculoskeletal: Negative.   Skin: Negative for color change and pallor.  Neurological: Negative for dizziness, syncope, weakness, numbness and headaches.  Hematological: Does not bruise/bleed easily.  Psychiatric/Behavioral: Negative.  Negative for altered mental  status.    Allergies  Review of patient's allergies indicates no known allergies.  Home Medications   Current Outpatient Rx  Name Route Sig Dispense Refill  . ASPIRIN-ACETAMINOPHEN-CAFFEINE 520-260-32.5 MG PO PACK Oral Take 1 packet by mouth as needed. For pain.     . INSULIN GLARGINE 100 UNIT/ML [place] SOLN Subcutaneous Inject 50 Units into the skin at bedtime.      Marland Kitchen SIMVASTATIN 40 MG PO TABS Oral Take 40 mg by mouth at bedtime.      Marland Kitchen SITAGLIPTIN-METFORMIN HCL 50-1000 MG PO TABS Oral Take 1 tablet by mouth 2 (two) times daily with a meal.        BP 159/97  Pulse 113  Temp(Src) 97.8 F (36.6 C) (Oral)  Resp 17  Wt 220 lb (99.791 kg)  SpO2 98%  Physical Exam  Nursing note and vitals reviewed. Constitutional: He is oriented to person, place, and time. He appears well-developed and well-nourished. No distress.  HENT:  Head: Normocephalic and atraumatic.  Mouth/Throat: Oropharynx is clear and moist.  Eyes: EOM are normal. Pupils are equal, round, and reactive to light.  Neck: Normal range of motion. Neck supple. No JVD present. No tracheal deviation present.  Cardiovascular: Normal rate, regular rhythm, S1 normal, S2 normal, normal heart sounds and intact distal pulses.   No extrasystoles are present. PMI is not displaced.  Exam reveals no gallop and no friction rub.   No murmur heard. Pulmonary/Chest: Effort normal and breath sounds normal. No accessory muscle usage or stridor. Not tachypneic. No respiratory distress. He has no decreased breath sounds. He has no wheezes. He has no rhonchi. He has no rales. He exhibits no tenderness, no bony tenderness, no crepitus and no retraction.  Abdominal: Soft. Bowel sounds are normal. He exhibits no distension and no mass. There is no tenderness. There is no rebound and no guarding.  Musculoskeletal: Normal range of motion. He exhibits no edema and no tenderness.  Neurological: He is alert and oriented to person, place, and time. No cranial  nerve deficit. He exhibits normal muscle tone.  Skin: Skin is warm and dry. No rash noted. He is not diaphoretic. No erythema. No pallor.  Psychiatric: He has a normal mood and affect. His behavior is normal. Judgment and thought content normal.    ED Course  Procedures (including critical care time)  Date: 07/17/2011  Rate: 104  Rhythm: sinus tachycardia  QRS Axis: left  Intervals: PR prolonged  ST/T Wave abnormalities: normal  Conduction Disutrbances:first-degree A-V block   Narrative Interpretation: Mild tachycardia with left axis deviation    Old EKG Reviewed: none available   Labs Reviewed  GLUCOSE, CAPILLARY - Abnormal; Notable for the following:    Glucose-Capillary 514 (*)    All other components within normal limits  POCT CBG MONITORING  CBC  I-STAT TROPONIN I  I-STAT, CHEM 8  PROTIME-INR  APTT  URINALYSIS, ROUTINE W REFLEX MICROSCOPIC   No results found.   No diagnosis found.  5:44 PM The patient has persistent chest pain after 3 sublingual nitroglycerin tablets and his EKG has changed from an incomplete right bundle branch block to complete bundle branch block. The hospitalist service is seen the patient and requested that I contact cardiology for consultation. I have spoken with Dr. Percival Spanish who  will see the patient but states that there may be a delay before he is able to make it to the emergency department to see this patient.  MDM  ACS, MI, unstable angina, Musculoskeletal chest pain, costochondritis, GERD, Gastrointestinal Chest Pain, Pleuritic Chest Pain, Pneumonia, Pneumothorax, Pulmonary Embolism, Esophageal Spasm, Arrhythmia considered among other potential etiologies in the patient's differential diagnosis.  Aspirin, nitroglycerin, morphine were given for the chest pain. IV fluids and insulin were given for hyperglycemia due to medication noncompliance. Nitroglycerin should also help with reduction of blood pressure, and rehydration should help with  reduction of mild sinus tachycardia.       Charlena Cross, MD 07/17/11 1555  Charlena Cross, MD 07/17/11 1609  Charlena Cross, MD 07/17/11 [phone]

## 2011-07-17 NOTE — ED Notes (Signed)
B/P 154/88  HR  115--Reported complete resolution of chest pain after 1st NTG but, within few minutes stated pain was returning--2nd NTG given

## 2011-07-17 NOTE — ED Notes (Signed)
B/P 136/95  HR  120  Reports complete resolution of chest pain after 2nd NTG---voided approx. 800 ml light yellow clear urine

## 2011-07-18 ENCOUNTER — Encounter (HOSPITAL_COMMUNITY)
Admission: EM | Disposition: A | Discharge status: Home / Self Care | Payer: Self-pay | Source: Home / Self Care | Attending: Cardiovascular Disease

## 2011-07-18 ENCOUNTER — Other Ambulatory Visit: Payer: Self-pay

## 2011-07-18 ENCOUNTER — Encounter (HOSPITAL_COMMUNITY): Payer: Self-pay

## 2011-07-18 DIAGNOSIS — I2510 Atherosclerotic heart disease of native coronary artery without angina pectoris: Secondary | ICD-10-CM

## 2011-07-18 DIAGNOSIS — I219 Acute myocardial infarction, unspecified: Secondary | ICD-10-CM

## 2011-07-18 HISTORY — PX: LEFT HEART CATHETERIZATION WITH CORONARY ANGIOGRAM: SHX5451

## 2011-07-18 HISTORY — PX: PERCUTANEOUS CORONARY STENT INTERVENTION (PCI-S): SHX5485

## 2011-07-18 LAB — CARDIAC PANEL(CRET KIN+CKTOT+MB+TROPI)
CK, MB: 37.2 ng/mL (ref 0.3–4.0)
CK, MB: 42.6 ng/mL (ref 0.3–4.0)
Relative Index: 11.1 — ABNORMAL HIGH (ref 0.0–2.5)
Total CK: 222 U/L (ref 7–232)
Total CK: 334 U/L — ABNORMAL HIGH (ref 7–232)
Total CK: 360 U/L — ABNORMAL HIGH (ref 7–232)
Troponin I: 6.64 ng/mL (ref <0.30)

## 2011-07-18 LAB — MRSA PCR SCREENING: MRSA by PCR: NEGATIVE

## 2011-07-18 LAB — BASIC METABOLIC PANEL
BUN: 10 mg/dL (ref 6–23)
Chloride: 98 mEq/L (ref 96–112)
Creatinine, Ser: 0.5 mg/dL (ref 0.50–1.35)
GFR calc Af Amer: >90 mL/min (ref >90)
GFR calc non Af Amer: >90 mL/min (ref >90)
Potassium: 4 mEq/L (ref 3.5–5.1)

## 2011-07-18 LAB — GLUCOSE, CAPILLARY
Glucose-Capillary: 269 mg/dL — ABNORMAL HIGH (ref 70–99)
Glucose-Capillary: 272 mg/dL — ABNORMAL HIGH (ref 70–99)
Glucose-Capillary: 294 mg/dL — ABNORMAL HIGH (ref 70–99)

## 2011-07-18 LAB — LIPID PANEL
LDL Cholesterol: 171 mg/dL — ABNORMAL HIGH (ref 0–99)
Triglycerides: 181 mg/dL — ABNORMAL HIGH (ref <150)
VLDL: 36 mg/dL (ref 0–40)

## 2011-07-18 LAB — HEMOGLOBIN A1C: Hgb A1c MFr Bld: 13.1 % — ABNORMAL HIGH (ref <5.7)

## 2011-07-18 LAB — TSH: TSH: 0.88 u[IU]/mL (ref 0.350–4.500)

## 2011-07-18 SURGERY — LEFT HEART CATHETERIZATION WITH CORONARY ANGIOGRAM
Anesthesia: LOCAL

## 2011-07-18 MED ORDER — LIDOCAINE HCL (PF) 1 % IJ SOLN
INTRAMUSCULAR | Status: AC
  Filled 2011-07-18: qty 30

## 2011-07-18 MED ORDER — ENOXAPARIN SODIUM 100 MG/ML ~~LOC~~ SOLN
100.0000 mg | Freq: Two times a day (BID) | SUBCUTANEOUS | Status: DC
  Filled 2011-07-18 (×2): qty 1

## 2011-07-18 MED ORDER — FENTANYL CITRATE 0.05 MG/ML IJ SOLN
INTRAMUSCULAR | Status: AC
  Filled 2011-07-18: qty 2

## 2011-07-18 MED ORDER — NITROGLYCERIN 0.2 MG/ML ON CALL CATH LAB
INTRAVENOUS | Status: AC
  Filled 2011-07-18: qty 1

## 2011-07-18 MED ORDER — DEXTROSE IN LACTATED RINGERS 5 % IV SOLN
INTRAVENOUS | Status: DC
  Administered 2011-07-18: 09:00:00 via INTRAVENOUS

## 2011-07-18 MED ORDER — METOPROLOL TARTRATE 25 MG PO TABS: 25.0000 mg | ORAL_TABLET | Freq: Two times a day (BID) | ORAL | Status: DC

## 2011-07-18 MED ORDER — SODIUM CHLORIDE 0.9 % IV SOLN: 1.0000 mL/kg/h | INTRAVENOUS | Status: DC

## 2011-07-18 MED ORDER — ONDANSETRON HCL 4 MG/2ML IJ SOLN: 4.0000 mg | Freq: Four times a day (QID) | INTRAMUSCULAR | Status: DC | PRN

## 2011-07-18 MED ORDER — MIDAZOLAM HCL 2 MG/2ML IJ SOLN
INTRAMUSCULAR | Status: AC
  Filled 2011-07-18: qty 2

## 2011-07-18 MED ORDER — ASPIRIN 81 MG PO CHEW
324.0000 mg | CHEWABLE_TABLET | ORAL | Status: AC
  Administered 2011-07-18: 324 mg via ORAL
  Filled 2011-07-18: qty 4

## 2011-07-18 MED ORDER — ACETAMINOPHEN 325 MG PO TABS: 650.0000 mg | ORAL_TABLET | ORAL | Status: DC | PRN

## 2011-07-18 MED ORDER — METOPROLOL TARTRATE 25 MG PO TABS
25.0000 mg | ORAL_TABLET | Freq: Two times a day (BID) | ORAL | Status: DC
  Administered 2011-07-18 – 2011-07-19 (×3): 25 mg via ORAL
  Filled 2011-07-18 (×4): qty 1

## 2011-07-18 MED ORDER — ENOXAPARIN SODIUM 60 MG/0.6ML ~~LOC~~ SOLN
50.0000 mg | SUBCUTANEOUS | Status: AC
  Administered 2011-07-18: 50 mg via SUBCUTANEOUS
  Filled 2011-07-18: qty 0.6

## 2011-07-18 MED ORDER — ROSUVASTATIN CALCIUM 10 MG PO TABS
10.0000 mg | ORAL_TABLET | Freq: Every day | ORAL | Status: DC
  Filled 2011-07-18: qty 1

## 2011-07-18 MED ORDER — MORPHINE SULFATE 2 MG/ML IJ SOLN: 1.0000 mg | INTRAMUSCULAR | Status: DC | PRN

## 2011-07-18 MED ORDER — HEPARIN SODIUM (PORCINE) 1000 UNIT/ML IJ SOLN
INTRAMUSCULAR | Status: AC
  Filled 2011-07-18: qty 1

## 2011-07-18 MED ORDER — INSULIN ASPART 100 UNIT/ML ~~LOC~~ SOLN
0.0000 [IU] | SUBCUTANEOUS | Status: DC
  Administered 2011-07-18: 5 [IU] via SUBCUTANEOUS
  Administered 2011-07-19: 8 [IU] via SUBCUTANEOUS
  Administered 2011-07-19: 5 [IU] via SUBCUTANEOUS
  Filled 2011-07-18: qty 3
  Filled 2011-07-18: qty 1

## 2011-07-18 MED ORDER — ROSUVASTATIN CALCIUM 20 MG PO TABS
20.0000 mg | ORAL_TABLET | Freq: Every day | ORAL | Status: DC
  Administered 2011-07-18 – 2011-07-19 (×2): 20 mg via ORAL
  Filled 2011-07-18 (×5): qty 1

## 2011-07-18 MED ORDER — PRASUGREL HCL 10 MG PO TABS
10.0000 mg | ORAL_TABLET | Freq: Every day | ORAL | Status: DC
  Administered 2011-07-18 – 2011-07-20 (×3): 10 mg via ORAL
  Filled 2011-07-18 (×4): qty 1

## 2011-07-18 MED ORDER — SODIUM CHLORIDE 0.9 % IV SOLN
INTRAVENOUS | Status: AC
  Administered 2011-07-18: 15:00:00 via INTRAVENOUS

## 2011-07-18 MED ORDER — ASPIRIN 81 MG PO CHEW
81.0000 mg | CHEWABLE_TABLET | Freq: Every day | ORAL | Status: DC
  Administered 2011-07-19 – 2011-07-20 (×2): 81 mg via ORAL
  Filled 2011-07-18 (×2): qty 1

## 2011-07-18 MED ORDER — PRASUGREL HCL 10 MG PO TABS
ORAL_TABLET | ORAL | Status: AC
  Filled 2011-07-18: qty 6

## 2011-07-18 MED ORDER — BIVALIRUDIN 250 MG IV SOLR
INTRAVENOUS | Status: AC
  Filled 2011-07-18: qty 250

## 2011-07-18 MED ORDER — VERAPAMIL HCL 2.5 MG/ML IV SOLN
INTRAVENOUS | Status: AC
  Filled 2011-07-18: qty 2

## 2011-07-18 MED ORDER — HEPARIN (PORCINE) IN NACL 2-0.9 UNIT/ML-% IJ SOLN
INTRAMUSCULAR | Status: AC
  Filled 2011-07-18: qty 2000

## 2011-07-18 MED ORDER — DIAZEPAM 5 MG PO TABS
5.0000 mg | ORAL_TABLET | ORAL | Status: AC
  Administered 2011-07-18: 5 mg via ORAL
  Filled 2011-07-18: qty 1

## 2011-07-18 NOTE — ED Notes (Signed)
Pt watching cath/pci video

## 2011-07-18 NOTE — ED Notes (Signed)
[facility] here to transport pt to [facility] Cath lab

## 2011-07-18 NOTE — Progress Notes (Signed)
Patient doing well post cath.  R radial band in place, and no significant bleeding.  He will need case management over the weekend to help with medication.  He received prasugrel, and this may be a challenge.    Our interventional strategy was to open the inferior subbranch below the total occlusion.  The subbranch fills the area supplied by the total with retrograde collaterals, and he has been pain free.  His enzymes were headed up at the time of cath.  Will recheck in am.    [name] 07/18/2011 5:44 PM

## 2011-07-18 NOTE — Progress Notes (Signed)
@   Subjective:  Denies CP or dyspnea   Objective:  Filed Vitals:   07/17/11 1759 07/17/11 1922 07/17/11 2306 07/18/11 0304  BP: 115/94 159/93 150/94 141/79  Pulse: 107  90 85  Temp:   97.7 F (36.5 C) 98.2 F (36.8 C)  TempSrc:   Oral Oral  Resp:  18 20 16   Weight:      SpO2: 98% 100% 99% 99%    Intake/Output from previous day: No intake or output data in the 24 hours ending 07/18/11 0756  Physical Exam: Physical exam: Well-developed well-nourished in no acute distress.  Skin is warm and dry.  HEENT is normal.  Neck is supple. No thyromegaly.  Chest is clear to auscultation with normal expansion.  Cardiovascular exam is regular rate and rhythm.  Abdominal exam nontender or distended. No masses palpated. Extremities show no edema. neuro grossly intact    Lab Results: Basic Metabolic Panel:  Basename 07/18/11 0520 07/17/11 1825 07/17/11 1542  NA 132* -- 133*  K 4.0 -- 4.5  CL 98 -- 98  CO2 22 -- --  GLUCOSE 290* -- 481*  BUN 10 -- 13  CREATININE 0.50 0.64 --  CALCIUM 9.3 -- --  MG -- -- --  PHOS -- -- --  CBC:  Basename 07/17/11 1825 07/17/11 1542 07/17/11 1530  WBC 6.8 -- 6.1  NEUTROABS -- -- --  HGB 14.9 16.7 --  HCT 41.7 49.0 --  MCV 83.1 -- 82.1  PLT 183 -- 168   Cardiac Enzymes:  Basename 07/18/11 0520 07/18/11 0028 07/17/11 1825  CKTOTAL 334* 222 52  CKMB 37.2* 18.6* 3.5  CKMBINDEX -- -- --  TROPONINI 2.98* 1.84* <0.30    Assessment/Plan:  1) NSTEMI - continue ASA, statin, lovenox, metoprolol; proceed with cath today; risks and benefits discussed and patient agrees to proceed. 2) DM - Management per primary care 3) Hyperlipidemia - change crestor to 40 mg po daily 4) Hypertension - Continue beta blocker; add ACEI after cath.  Kirk Ruths 07/18/2011, 7:56 AM

## 2011-07-18 NOTE — ED Notes (Signed)
Attempted to call report to icu, number left for rn to return call

## 2011-07-18 NOTE — Progress Notes (Signed)
*  PRELIMINARY RESULTS* Echocardiogram 2D Echocardiogram has been performed.  Roxine Caddy [facility] 07/18/2011, 11:22 AM

## 2011-07-18 NOTE — ED Notes (Signed)
CKMB 18.6 AND TROPONIN OF 1.84 CALLED TO DR. [facility] WHO wanted results called to cardiology, cardiology paged pt remains pain free with no complaints at this time

## 2011-07-18 NOTE — H&P (View-Only) (Signed)
@   Subjective:  Denies CP or dyspnea   Objective:  Filed Vitals:   07/17/11 1759 07/17/11 1922 07/17/11 2306 07/18/11 0304  BP: 115/94 159/93 150/94 141/79  Pulse: 107  90 85  Temp:   97.7 F (36.5 C) 98.2 F (36.8 C)  TempSrc:   Oral Oral  Resp:  18 20 16   Weight:      SpO2: 98% 100% 99% 99%    Intake/Output from previous day: No intake or output data in the 24 hours ending 07/18/11 0756  Physical Exam: Physical exam: Well-developed well-nourished in no acute distress.  Skin is warm and dry.  HEENT is normal.  Neck is supple. No thyromegaly.  Chest is clear to auscultation with normal expansion.  Cardiovascular exam is regular rate and rhythm.  Abdominal exam nontender or distended. No masses palpated. Extremities show no edema. neuro grossly intact    Lab Results: Basic Metabolic Panel:  Basename 07/18/11 0520 07/17/11 1825 07/17/11 1542  NA 132* -- 133*  K 4.0 -- 4.5  CL 98 -- 98  CO2 22 -- --  GLUCOSE 290* -- 481*  BUN 10 -- 13  CREATININE 0.50 0.64 --  CALCIUM 9.3 -- --  MG -- -- --  PHOS -- -- --  CBC:  Basename 07/17/11 1825 07/17/11 1542 07/17/11 1530  WBC 6.8 -- 6.1  NEUTROABS -- -- --  HGB 14.9 16.7 --  HCT 41.7 49.0 --  MCV 83.1 -- 82.1  PLT 183 -- 168   Cardiac Enzymes:  Basename 07/18/11 0520 07/18/11 0028 07/17/11 1825  CKTOTAL 334* 222 52  CKMB 37.2* 18.6* 3.5  CKMBINDEX -- -- --  TROPONINI 2.98* 1.84* <0.30    Assessment/Plan:  1) NSTEMI - continue ASA, statin, lovenox, metoprolol; proceed with cath today; risks and benefits discussed and patient agrees to proceed. 2) DM - Management per primary care 3) Hyperlipidemia - change crestor to 40 mg po daily 4) Hypertension - Continue beta blocker; add ACEI after cath.  Kirk Ruths 07/18/2011, 7:56 AM

## 2011-07-18 NOTE — ED Notes (Signed)
Dr bensimon notified of elevated cardiac enzymes, no further orders obtained at this time will cont. To monitor

## 2011-07-18 NOTE — ED Notes (Signed)
Samtani, MD in to see pt. New orders received. Pt to go to cath lab today, time unknown. Pt free of chest pain at this time. Will continue to monitor.

## 2011-07-18 NOTE — ED Notes (Signed)
Echo being performed at bedside

## 2011-07-18 NOTE — Op Note (Signed)
Cardiac Catheterization Procedure Note  Name: Derek Lowery MRN: [id number] DOB: February 15, 1956  Procedure: Left Heart Cath, Selective Coronary Angiography, LV angiography, PTCA and stenting of the CFX marginal subbranch  Indication:  Procedural Details:  The right wrist was prepped, draped, and anesthetized with 1% lidocaine. Using the modified Seldinger technique, a 5 French sheath was introduced into the right radial artery. 3 mg of verapamil was administered through the sheath, weight-based unfractionated heparin was administered intravenously. Standard Judkins catheters were used for selective coronary angiography and left ventriculography. Catheter exchanges were performed over an exchange length guidewire.  PROCEDURAL FINDINGS Hemodynamics: AO 121/81 (100) LV 129/20   Coronary angiography: Coronary dominance: right  Left mainstem: The left mainstem coronary artery was free of significant disease.  Left anterior descending (LAD): The left anterior descending artery was calcified proximally and divided into a large diagonal and a major left anterior descending artery which wrapped the apex. The large diagonal branch had about 60% narrowing proximally and then divided into 2 large branches distally. These were free of disease the left anterior descending vessel had 60% narrowing in the distal vessel had the appearance of a diabetic artery that was diffusely plaqued.  Left circumflex (LCx): The circumflex vessel was a large caliber vessel that coursed posteriorly and provided some of the inferior wall. There was a tiny first marginal branch, and this was free of disease. The second marginal branch was extremely large in the superior subbranch was occluded with what appeared to be fresh thrombus. The inferior branch was severely diseased with 90% segmental narrowing and provided collaterals to the superior branch in a retrograde fashion. The AV circumflex was without critical narrowing it  supplied several small distal branches.  Right coronary artery (RCA): Calcification proximally  Left ventriculography: Left ventricular systolic function is minimally depressed, with a mid inferior wall motion abnormality. There was hypokinesis in this segment. Ejection fraction would be estimated at 55%.  PCI Note:  I reviewed the films with Dr. Burt Knack. We felt the best strategy was to stent the inferior branch which collateralized the acute vessel. The acute vessel was likely one-day old as  the patient had no chest pain. The 5 French sheath was exchanged for a 6 [place] sheath, and Angiomax was given in weight-based fashion. Prasugrel was administered as a 60 mg dose. A Judkins 3.0 6 [place] guiding catheter was utilized with a traverse wire. Predilatation was done with a 2.25 mm balloon. A 2.25 mm  Promus Element stent times 67m was utilized to stent the lesion, and a 2.5 [place] Apex balloon was used to post dilate.  An excellent result was achieved.  A TR band was then placed, and anticoagulation discontinued.  There were no complications.  0% residual stenosis was achieved after max post dilatation.  PCI Data: Vessel - CFX/Segment - OM2 Percent Stenosis (pre)  99 TIMI-flow 3 Stent 2.275mby 2084mromus Element Percent Stenosis (post) 0 TIMI-flow (post) 3  Final Conclusions:   Successful PCI of OM2 subbranch which collateralizes the superior subbranch which remains occluded   Recommendations:  DAPT for one year, improved diabetes control. Cardiac rehab.  Case management.   ThoBing Quarry/30/2012, 2:39 PM

## 2011-07-18 NOTE — Interval H&P Note (Signed)
History and Physical Interval Note:  07/18/2011 12:29 PM  Derek Lowery  has presented today for surgery, with the diagnosis of chest pain  The various methods of treatment have been discussed with the patient and family. After consideration of risks, benefits and other options for treatment, the patient has consented to  Procedure(s): [place] as a surgical intervention .  The patients' history has been reviewed, patient examined, no change in status, stable for surgery.  I have reviewed the patients' chart and labs.  Questions were answered to the patient's satisfaction.  Patient set up by Dr. Stanford Breed for cath study.  Risks reviewed. Enzymes are pos consistent with non STEMI.  He is agreeable to proceed.    Derek Lowery     Derek Lowery 07/18/2011 12:30 PM

## 2011-07-18 NOTE — Progress Notes (Signed)
[facility] HOSPITALIST STEP-DOWN PROGRESS NOTE   Patient Details:    Derek Lowery is an 55 y.o. male who presented 11.29.12 with Acute CP since 04:00 am and then presented to [facility] as the pain increased.  He has ruled in for NSTEMI and has been seen by Cardiology, who have him planned for Cardiac Catheterization today, to further delineate coronary anatomy-->he is an ADD-ON to the Cath list and his disposition subsequent to Cath will be determined by Cardiologist subsequently     Best Practice/Protocols:  VTE Prophylaxis: Lovenox (full dose) Hyperglycemia (ICU)  Events: noted ruled in for NSTEMI with CE's Plan seems to be to Transfer to [facility] hospital for further care   Studies: EKG this am= NSR, PR=0.22, QRS=narrow, Indeterminant  Vs RBB +PVc's, no acute St-T changesQRS axis=Leftward Axis about -30 degrees, Q wave noted in lead 3, Possible J point depression in 2, with borderline LAE  Dg Chest Portable 1 View  07/17/2011  *RADIOLOGY REPORT*  Clinical Data: Chest pain  PORTABLE CHEST - 1 VIEW  Comparison: None.  Findings: Normal heart size.  Clear lungs. No pneumothorax.  IMPRESSION: No active cardiopulmonary disease.  Original Report Authenticated By: Jamas Lav, M.D.    Consults:[facility]     Subjective:    Overnight Issues: no CP, no SOB, has a headache right now.  No darl or tarry stool.  NO blurred visio or double vision States understands he has had a Coronary event and is a little apprehensive, but otherwise fine   Objective:  Vital signs for last 24 hours: Temp:  [97.7 F (36.5 C)-98.2 F (36.8 C)] 98.2 F (36.8 C) (11/30 0304) Pulse Rate:  [85-113] 85  (11/30 0304) Resp:  [16-20] 16  (11/30 0304) BP: (115-159)/(79-97) 141/79 mmHg (11/30 0304) SpO2:  [98 %-100 %] 99 % (11/30 0304) Weight:  [99.791 kg (220 lb)] 220 lb (99.791 kg) (11/29 1411)  Hemodynamic parameters for last 24 hours:   Filed Vitals:   07/17/11 1759 07/17/11 1922 07/17/11 2306 07/18/11 0304    BP: 115/94 159/93 150/94 141/79  Pulse: 107  90 85  Temp:   97.7 F (36.5 C) 98.2 F (36.8 C)  TempSrc:   Oral Oral  Resp:  18 20 16   Weight:      SpO2: 98% 100% 99% 99%   Physical Exam:  General: alert and no respiratory distress Neuro: alert, oriented and nonfocal exam Resp: clear to auscultation bilaterally and normal percussion bilaterally CVS: regular rate and rhythm, S1, S2 normal, no murmur, click, rub or gallop and Tachycardia seems resolved GI: soft, nontender, BS WNL, no r/g and tender Skin: no rash  Assessment/Plan:   NEURO  NAD   Plan: NAd  PULM  NAD   Plan: nad  CARDIO  Acute Coronary Syndrome: coronary artery spasm and unstable angina   Plan: Will need Cath per cards-If this shows any blockages, would consider a Stent or CABG, but unlikely, given the fact that his EKG shows no ST elevations-Would keep an foley catheter in and review I/o aggressively, since he was boluses numerous times and kept on IVf yesterday--The concern would be not to fluid overload him if he does have asynchrony of Myocardium  RENAL  Hyponatremia etiology unknown   Plan: Could be 2/2 to poor PO intake-review labs am  GI  NAD   Plan: Nad  ID  Nad   Plan: NAD  HEME  Nad   Plan: nad   ENDO Hyperosmolar Nonketotic Coma (likely this vs  poor control)   Plan: Keep on insulin SSI, and as NPO will change IVF to D5/NS with  as will likley get cathed today Labs am His Lipid panel reveals a significant LDL fraction elevation of 171, with low HDL Will ask dietician to weigh in once he stabilizes from the Cardiac issues  Global Issues      LOS: 1 day   Additional comments:I reviewed the patient's new clinical lab test results. including am EKG, CE's, I reviewed the patients new imaging test results. I will chang ethe echo to STAT, given I am unlcear when he will get Cathed, I reviewed the patient's other test results. as well and I discussed the patient's care with Dr. Kirk Ruths, who  agess with this plan--Patient to be followed by [facility] for Glycemic issues acutely--I have intimated Dr. Stanford Breed I would need to know where the patient finally end's up so that such follow-up can be arranfged, Either at [facility] or [facility] hospital.  Critical Care Total Time*: 45 Minutes  [facility] 07/18/2011  Verneita Griffes, MD [facility] Hospitalist (P) [phone]   I HAVE ALSO DISCUSSED THIS PLAN OF CARE EXHAUSTIVELY WITH PATIENT.

## 2011-07-18 NOTE — Progress Notes (Signed)
ANTICOAGULATION CONSULT NOTE - Initial Consult  Pharmacy Consult for lovenox Indication: unstable angina  No Known Allergies  Patient Measurements: Weight: 220 lb (99.791 kg) Adjusted Body Weight:   Vital Signs: Temp: 98.2 F (36.8 C) (11/30 0304) Temp src: Oral (11/30 0304) BP: 141/79 mmHg (11/30 0304) Pulse Rate: 85  (11/30 0304)  Labs:  Basename 07/18/11 0028 07/17/11 1825 07/17/11 1542 07/17/11 1540 07/17/11 1530  HGB -- 14.9 16.7 -- --  HCT -- 41.7 49.0 -- 43.5  PLT -- 183 -- -- 168  APTT -- -- -- -- 28  LABPROT -- -- -- -- 13.2  INR -- -- -- -- 0.98  HEPARINUNFRC -- -- -- -- --  CREATININE -- 0.64 0.90 -- --  CKTOTAL 222 52 -- 65 --  CKMB 18.6* 3.5 -- 3.8 --  TROPONINI 1.84* <0.30 -- <0.30 --   CrCl is unknown because there is no height on file for the current visit.  Medical History: Past Medical History  Diagnosis Date  . Hypercholesteremia   . Type 2 diabetes mellitus 1990  . Internal hemorrhoids   . Essential hypertension, benign   . Noncompliance      Assessment: Pt with chest pain, prior dose of 58m (0.531mkg) lovenox given.  Orders for treatment dose lovenox now CrCl is est > 3015min Goal of Therapy:  Enoxaparin dosed based on patient weight and renal function   Plan:  Lovenox 70m32m x1 now, then 100mg16mq12hr, next dose at 1200 [facility] 07/18/2011,3:47 AM

## 2011-07-19 LAB — GLUCOSE, CAPILLARY
Glucose-Capillary: 228 mg/dL — ABNORMAL HIGH (ref 70–99)
Glucose-Capillary: 294 mg/dL — ABNORMAL HIGH (ref 70–99)
Glucose-Capillary: 258 mg/dL — ABNORMAL HIGH (ref 70–99)
Glucose-Capillary: 119 mg/dL — ABNORMAL HIGH (ref 70–99)

## 2011-07-19 LAB — CBC
MCH: 30.3 pg (ref 26.0–34.0)
MCHC: 36.1 g/dL — ABNORMAL HIGH (ref 30.0–36.0)
MCV: 83.8 fL (ref 78.0–100.0)
Platelets: 176 10*3/uL (ref 150–400)
RBC: 5.55 MIL/uL (ref 4.22–5.81)
RDW: 12.8 % (ref 11.5–15.5)

## 2011-07-19 LAB — CARDIAC PANEL(CRET KIN+CKTOT+MB+TROPI)
CK, MB: 14 ng/mL (ref 0.3–4.0)
CK, MB: 11.5 ng/mL (ref 0.3–4.0)
Total CK: 147 U/L (ref 7–232)
Troponin I: 4.73 ng/mL (ref <0.30)
Troponin I: 4.44 ng/mL (ref <0.30)

## 2011-07-19 LAB — BASIC METABOLIC PANEL
CO2: 24 mEq/L (ref 19–32)
Calcium: 9 mg/dL (ref 8.4–10.5)
Creatinine, Ser: 0.49 mg/dL — ABNORMAL LOW (ref 0.50–1.35)
GFR calc non Af Amer: >90 mL/min (ref >90)
Glucose, Bld: 233 mg/dL — ABNORMAL HIGH (ref 70–99)
Sodium: 137 mEq/L (ref 135–145)

## 2011-07-19 MED ORDER — INSULIN ASPART 100 UNIT/ML ~~LOC~~ SOLN
3.0000 [IU] | Freq: Three times a day (TID) | SUBCUTANEOUS | Status: DC
  Administered 2011-07-19 – 2011-07-20 (×3): 3 [IU] via SUBCUTANEOUS

## 2011-07-19 MED ORDER — INSULIN GLARGINE 100 UNIT/ML ~~LOC~~ SOLN
25.0000 [IU] | Freq: Every day | SUBCUTANEOUS | Status: DC
  Administered 2011-07-19: 25 [IU] via SUBCUTANEOUS
  Filled 2011-07-19: qty 3

## 2011-07-19 MED ORDER — SODIUM CHLORIDE 0.9 % IJ SOLN: 3.0000 mL | Freq: Two times a day (BID) | INTRAMUSCULAR | Status: DC

## 2011-07-19 MED ORDER — METOPROLOL TARTRATE 50 MG PO TABS
50.0000 mg | ORAL_TABLET | Freq: Two times a day (BID) | ORAL | Status: DC
  Administered 2011-07-19 – 2011-07-20 (×2): 50 mg via ORAL
  Filled 2011-07-19 (×3): qty 1

## 2011-07-19 MED ORDER — INSULIN ASPART 100 UNIT/ML ~~LOC~~ SOLN: 0.0000 [IU] | Freq: Every day | SUBCUTANEOUS | Status: DC

## 2011-07-19 MED ORDER — LISINOPRIL 5 MG PO TABS
5.0000 mg | ORAL_TABLET | Freq: Every day | ORAL | Status: DC
  Administered 2011-07-19 – 2011-07-20 (×2): 5 mg via ORAL
  Filled 2011-07-19 (×2): qty 1

## 2011-07-19 MED ORDER — INSULIN ASPART 100 UNIT/ML ~~LOC~~ SOLN
0.0000 [IU] | Freq: Three times a day (TID) | SUBCUTANEOUS | Status: DC
  Administered 2011-07-19 (×2): 11 [IU] via SUBCUTANEOUS
  Administered 2011-07-20: 7 [IU] via SUBCUTANEOUS
  Filled 2011-07-19: qty 3

## 2011-07-19 NOTE — Consult Note (Signed)
[facility] CONSULT F/U NOTE  As per my discussion with Dr. Acie Fredrickson, the attending care of this patient has been turned over to [facility].  [facility] will continue to follow in a consultative role for DM management.    Outpatient Primary Care MD:  Heath Gold, MD  Subjective: The patient is resting comfortably at the bedside. He has no complaints. He admits to noncompliance with his insulin. He states that he can reschedule himself to follow up with Dr. Karlton Lemon without difficulty.  Objective: Weight change: -3.991 kg (-8 lb 12.8 oz)  Intake/Output Summary (Last 24 hours) at 07/19/11 1118 Last data filed at 07/19/11 0900  Gross per 24 hour  Intake   1920 ml  Output   3500 ml  Net  -1580 ml   Blood pressure 109/71, pulse 110, temperature 97.5 F (36.4 C), temperature source Oral, resp. rate 21, height 5' 10"  (1.778 m), weight 96.6 kg (212 lb 15.4 oz), SpO2 96.00%. Temp:  [97.5 F (36.4 C)-98.4 F (36.9 C)] 97.5 F (36.4 C) (12/01 0730) Pulse Rate:  [86-110] 110  (12/01 1046) Resp:  [10-21] 21  (12/01 0730) BP: (109-157)/(66-91) 109/71 mmHg (12/01 1046) SpO2:  [95 %-98 %] 96 % (12/01 0400) Weight:  [95.8 kg (211 lb 3.2 oz)-96.6 kg (212 lb 15.4 oz)] 212 lb 15.4 oz (96.6 kg) (12/01 0500)  Physical Exam: General: No acute respiratory distress Lungs: Clear to auscultation bilaterally without wheezes or crackles Cardiovascular: Regular rate and rhythm without murmur gallop or rub normal S1 and S2 Abdomen: Nontender, nondistended, soft, bowel sounds positive, no rebound, no ascites, no appreciable mass Extremities: No significant cyanosis, clubbing, or edema bilateral lower extremities  Lab Results:  Basename 07/19/11 0700 07/18/11 0520 07/17/11 1825 07/17/11 1542  NA 137 132* -- 133*  K 4.3 4.0 -- 4.5  CL 99 98 -- 98  CO2 24 22 -- --  GLUCOSE 233* 290* -- 481*  BUN 9 10 -- 13  CREATININE 0.49* 0.50 0.64 --  CALCIUM 9.0 9.3 -- --  MG -- -- -- --  PHOS  -- -- -- --   No results found for this basename: AST:2,ALT:2,ALKPHOS:2,BILITOT:2,PROT:2,ALBUMIN:2 in the last 72 hours No results found for this basename: LIPASE:2,AMYLASE:2 in the last 72 hours  Basename 07/19/11 0700 07/17/11 1825 07/17/11 1542 07/17/11 1530  WBC 10.1 6.8 -- 6.1  NEUTROABS -- -- -- --  HGB 16.8 14.9 16.7 --  HCT 46.5 41.7 49.0 --  MCV 83.8 83.1 -- 82.1  PLT 176 183 -- 168    Basename 07/19/11 0640 07/18/11 1142 07/18/11 0520  CKTOTAL 207 360* 334*  CKMB 21.1* 42.6* 37.2*  CKMBINDEX -- -- --  TROPONINI 4.73* 6.64* 2.98*   No results found for this basename: POCBNP:3 in the last 72 hours No results found for this basename: DDIMER:2 in the last 72 hours  Basename 07/18/11 0520  HGBA1C 13.1*    Basename 07/18/11 0520  CHOL 239*  HDL 32*  LDLCALC 171*  TRIG 181*  CHOLHDL 7.5  LDLDIRECT --    Basename 07/18/11 0520  TSH 0.880  T4TOTAL --  T3FREE --  THYROIDAB --   No results found for this basename: VITAMINB12:2,FOLATE:2,FERRITIN:2,TIBC:2,IRON:2,RETICCTPCT:2 in the last 72 hours  Micro Results: Recent Results (from the past 240 hour(s))  MRSA PCR SCREENING     Status: Normal   Collection Time   07/18/11  5:49 PM      Component Value Range Status Comment   MRSA by PCR NEGATIVE  NEGATIVE  Final     Studies/Results: Scheduled Meds:   . aspirin  81 mg Oral Daily  . bivalirudin      . diazepam  5 mg Oral On Call  . fentaNYL      . heparin      . heparin      . insulin aspart  0-15 Units Subcutaneous Q4H  . insulin glargine  25 Units Subcutaneous QHS  . lidocaine      . lisinopril  5 mg Oral Daily  . metoprolol tartrate  50 mg Oral BID  . midazolam      . nitroGLYCERIN      . prasugrel      . prasugrel  10 mg Oral Daily  . rosuvastatin  20 mg Oral q1800  . verapamil      . DISCONTD: enoxaparin (LOVENOX) injection  100 mg Subcutaneous Q12H  . DISCONTD: metoprolol tartrate  25 mg Oral BID  . DISCONTD: nitroGLYCERIN  0.5 inch Topical Q6H    Continuous Infusions:   . sodium chloride 150 mL/hr at 07/18/11 1645  . DISCONTD: dextrose 5% lactated ringers 50 mL/hr at 07/18/11 0854   PRN Meds:.acetaminophen, morphine, nitroGLYCERIN, ondansetron (ZOFRAN) IV, DISCONTD: morphine  Recommendations:  Coronary artery disease-as per primary service  DM (diabetes mellitus)/uncontrolled-the patient's hemaglobin A1c is 13!-He admits to noncompliance with his home insulin regimen and states he essentially has taken nothing for one year-he is also been noncompliant with follow her Dr. Vinetta Bergamo reports that he can easily follow Dr. Karlton Lemon and wishes to do so for his ongoing medical care-I agree with resuming Lantus-the patient is RA on aspirin and an ACE inhibitor as per the primary service  HLD (hyperlipidemia)-on medical treatment as per primary  Essential hypertension-titration of medication as per primary service-currently well controlled   LOS: 2 days   MCCLUNG,JEFFREY T 07/19/2011, 11:18 AM

## 2011-07-19 NOTE — Progress Notes (Signed)
Subjective:    Derek Lowery is doing very well. He ambulated without any chest pain or shortness of breath. His heart rate was 120 at the end of walking one lap around the CCU.  He denies any chest pain or shortness of breath.  He had stenting of his left circumflex marginal vessel yesterday by Dr. Lia Foyer.      Marland Kitchen aspirin  81 mg Oral Daily  . bivalirudin      . diazepam  5 mg Oral On Call  . fentaNYL      . heparin      . heparin      . insulin aspart  0-15 Units Subcutaneous Q4H  . lidocaine      . metoprolol tartrate  50 mg Oral BID  . midazolam      . nitroGLYCERIN      . prasugrel      . prasugrel  10 mg Oral Daily  . rosuvastatin  20 mg Oral q1800  . verapamil      . DISCONTD: enoxaparin (LOVENOX) injection  100 mg Subcutaneous Q12H  . DISCONTD: metoprolol tartrate  25 mg Oral BID  . DISCONTD: nitroGLYCERIN  0.5 inch Topical Q6H      . sodium chloride 150 mL/hr at 07/18/11 1645  . DISCONTD: dextrose 5% lactated ringers 50 mL/hr at 07/18/11 0854    Objective:  Vital Signs in the last 24 hours: Blood pressure 136/66, pulse 86, temperature 97.5 F (36.4 C), temperature source Oral, resp. rate 21, height 5' 10"  (1.778 m), weight 212 lb 15.4 oz (96.6 kg), SpO2 96.00%. Temp:  [97.5 F (36.4 C)-98.4 F (36.9 C)] 97.5 F (36.4 C) (12/01 0730) Pulse Rate:  [86-89] 86  (12/01 0730) Resp:  [10-21] 21  (12/01 0730) BP: (131-157)/(66-99) 136/66 mmHg (12/01 0730) SpO2:  [95 %-99 %] 96 % (12/01 0400) Weight:  [211 lb 3.2 oz (95.8 kg)-212 lb 15.4 oz (96.6 kg)] 212 lb 15.4 oz (96.6 kg) (12/01 0500)  Intake/Output from previous day: 11/30 0701 - 12/01 0700 In: 1560 [P.O.:720; I.V.:840] Out: 3500 [Urine:3500] Intake/Output from this shift:    Physical Exam: The patient is alert and oriented x 3.  The mood and affect are normal.   Skin: warm and dry.  Color is normal.    HEENT:   the sclera are nonicteric.  The mucous membranes are moist.  The carotids are 2+ without  bruits.  There is no thyromegaly.  There is no JVD.    Lungs: clear.  The chest wall is non tender.    Heart: regular rate with a normal S1 and S2.  There are no murmurs, gallops, or rubs. The PMI is not displaced.     Abdomen: good bowel sounds.  There is no guarding or rebound.  There is no hepatosplenomegaly or tenderness.  There are no masses.   Extremities:  no clubbing, cyanosis, or edema.  The legs are without rashes.  The distal pulses are intact.   Neuro:  Cranial nerves II - XII are intact.  Motor and sensory functions are intact.     Lab Results:  Basename 07/19/11 0700 07/17/11 1825  WBC 10.1 6.8  HGB 16.8 14.9  PLT 176 183    Basename 07/19/11 0700 07/18/11 0520  NA 137 132*  K 4.3 4.0  CL 99 98  CO2 24 22  GLUCOSE 233* 290*  BUN 9 10  CREATININE 0.49* 0.50    Basename 07/19/11 0640 07/18/11 1142  TROPONINI  4.73* 6.64*   No results found for this basename: BNP in the last 72 hours Hepatic Function Panel No results found for this basename: PROT,ALBUMIN,AST,ALT,ALKPHOS,BILITOT,BILIDIR,IBILI in the last 72 hours Lab Results  Component Value Date   CHOL 239* 07/18/2011   HDL 32* 07/18/2011   LDLCALC 171* 07/18/2011   TRIG 181* 07/18/2011   CHOLHDL 7.5 07/18/2011    Basename 07/17/11 1530  INR 0.98     Assessment/Plan:   1. Coronary artery disease the patient had a complex obtuse marginal coronary artery blockage. The obtuse marginal artery bifurcated into 2 branches.  The superior branch was completely occluded and was left alone. The inferior branch had a complex 90% stenosis and this branch was stented. He's done well and has not had any episodes of chest pain. His troponin levels are gradually decreasing.  We'll transfer him to telemetry.  We will increase his metoprolol to 50 mg twice a day. Add lisinopril 5 mg a day Continue Crestor Continued Prasugril Restart Lantus insulin at 25 units each bedtime. It should be noted that the patient was on  50 units at night previously but in fact he had not had it for quite some time. We will start him at this lower dose and titrate up as needed.   Disposition: Transfer to telemetry today. Anticipate discharge home on Sunday. Continue with cardiac rehabilitation. Continue medications as noted above.  Thayer Headings, Brooke Bonito., MD, [facility] 07/19/2011, 10:19 AM LOS: Day 2

## 2011-07-19 NOTE — Progress Notes (Signed)
CARDIAC REHAB PHASE I   PRE:  Rate/Rhythm: 112ST  BP:  Supine:   Sitting: 116/75  Standing:    SaO2:   MODE:  Ambulation: 350 ft   POST:  Rate/Rhythem: 119ST  BP:  Supine:   Sitting: 109/71  Standing:    SaO2: 97%RA  Pt walked 350 ft on RA with steady gait. Denied CP. Notified cardiologist of elevated HR.  Education started with pt. Pt needs lots of reenforcement. Pt was not taking insulin or statins due to finances. Stressed importance of taking meds and told pt he would need to take efficient daily. Pt has not been watching diet. Rn states has put in dietician consult.  Put on diabetic video on diet after walking for pt to view.  Permission given to refer to [facility] Phase 2. Pt doesn't drive and may not be able to arrange transportation. [id number]  Jeani Sow

## 2011-07-20 DIAGNOSIS — I214 Non-ST elevation (NSTEMI) myocardial infarction: Secondary | ICD-10-CM

## 2011-07-20 LAB — GLUCOSE, CAPILLARY: Glucose-Capillary: 290 mg/dL — ABNORMAL HIGH (ref 70–99)

## 2011-07-20 LAB — CARDIAC PANEL(CRET KIN+CKTOT+MB+TROPI)
CK, MB: 8.1 ng/mL (ref 0.3–4.0)
Relative Index: INVALID (ref 0.0–2.5)
Total CK: 95 U/L (ref 7–232)
Troponin I: 3.39 ng/mL (ref <0.30)

## 2011-07-20 MED ORDER — LISINOPRIL 5 MG PO TABS: 5.0000 mg | ORAL_TABLET | Freq: Every day | ORAL | Status: DC

## 2011-07-20 MED ORDER — INSULIN NPH (HUMAN) (ISOPHANE) 100 UNIT/ML ~~LOC~~ SUSP: 20.0000 [IU] | Freq: Two times a day (BID) | SUBCUTANEOUS | Status: DC

## 2011-07-20 MED ORDER — NITROGLYCERIN 0.4 MG SL SUBL: 0.4000 mg | SUBLINGUAL_TABLET | SUBLINGUAL | Status: DC | PRN

## 2011-07-20 MED ORDER — PRASUGREL HCL 10 MG PO TABS: 10.0000 mg | ORAL_TABLET | Freq: Every day | ORAL | Status: DC

## 2011-07-20 MED ORDER — INSULIN NPH (HUMAN) (ISOPHANE) 100 UNIT/ML ~~LOC~~ SUSP
20.0000 [IU] | Freq: Two times a day (BID) | SUBCUTANEOUS | Status: DC
  Filled 2011-07-20: qty 10

## 2011-07-20 MED ORDER — ATORVASTATIN CALCIUM 80 MG PO TABS: 80.0000 mg | ORAL_TABLET | Freq: Every day | ORAL | Status: DC

## 2011-07-20 MED ORDER — METOPROLOL TARTRATE 50 MG PO TABS: 50.0000 mg | ORAL_TABLET | Freq: Two times a day (BID) | ORAL | Status: DC

## 2011-07-20 MED ORDER — ASPIRIN 81 MG PO CHEW: 81.0000 mg | CHEWABLE_TABLET | Freq: Every day | ORAL | Status: AC

## 2011-07-20 NOTE — Discharge Summary (Signed)
Physician Discharge Summary  Patient ID: Derek Lowery MRN: [id number] DOB/AGE: [birth year] 55 y.o.  Admit date: 07/17/2011 Discharge date: 07/20/2011  Primary Discharge Diagnosis: NSTEMI Secondary Discharge Diagnosis:  Patient Active Problem List  Diagnoses  . Chest pain radiating to jaw  . DM (diabetes mellitus)  . HLD (hyperlipidemia)  . Tachycardia-multifactorial  . Unstable angina  . Essential hypertension, benign     Significant Diagnostic Studies:Left Heart Cath, Selective Coronary Angiography, LV angiography, PTCA and stenting of the CFX marginal subbranch, 2-D echocardiogram   Consults: Internal medicine  Hospital Course: Derek Lowery is a 55 year old male with no previous cardiac history. He had chest pain and came to the hospital where he was admitted for further evaluation. He had elevation in his cardiac enzymes indicating a non-ST segment elevation MI.   He was taken to the cath lab on 09/16/2010. The LAD had a 60% narrowing in the diffuse vessel in the distal vessel. A large diagonal branch has 60% lesion. The OM 2 was occluded in the superior subbranch with what appeared to be fresh thrombus. The inferior branch had a 90% lesion. The RCA had proximal calcification in his EF was 55%. Leg/OM was treated with PTCA and a 2.25 mm x 20 mm drug-eluting stent. No residual stenosis. He was seen by cardiac rehabilitation. He ambulated with cardiac rehabilitation and was educated on heart healthy lifestyle and MI restrictions.   He had not been on blood pressure medications for several months prior to admission. He was started on a beta blocker. Lipid profile was checked and he was started on a statin as well. Internal medicine saw the patient and adjusted his diabetes medications for better blood sugar control and cost savings. By 07/20/2011, Derek Lowery was doing better better. His blood sugar control and blood pressure control were improved. He was evaluated by Dr. Acie Fredrickson and  considered stable for discharge, to followup as an outpatient.  Labs:   Lab Results  Component Value Date   WBC 10.1 07/19/2011   HGB 16.8 07/19/2011   HCT 46.5 07/19/2011   MCV 83.8 07/19/2011   PLT 176 07/19/2011    Lab 07/19/11 0700  NA 137  K 4.3  CL 99  CO2 24  BUN 9  CREATININE 0.49*  CALCIUM 9.0  PROT --  BILITOT --  ALKPHOS --  ALT --  AST --  GLUCOSE 233*   Lab Results  Component Value Date   CKTOTAL 78 07/20/2011   CKMB 7.0* 07/20/2011   TROPONINI 1.74* 07/20/2011    Lab Results  Component Value Date   CHOL 239* 07/18/2011   Lab Results  Component Value Date   HDL 32* 07/18/2011   Lab Results  Component Value Date   LDLCALC 171* 07/18/2011   Lab Results  Component Value Date   TRIG 181* 07/18/2011   Lab Results  Component Value Date   CHOLHDL 7.5 07/18/2011   No results found for this basename: LDLDIRECT      Radiology: IMPRESSION: No active cardiopulmonary disease.  EKG: Sinus tachycardia Possible Left atrial enlargement Right superior axis deviation Inferior-posterior infarct , age undetermined Abnormal ECG No significant change since last tracing Vent. rate 103 BPM PR interval 170 ms QRS duration 102 ms QT/QTc 376/492 ms P-R-T axes 65 236 65  FOLLOW UP PLANS AND APPOINTMENTS Discharge Orders    Future Orders Please Complete By Expires   Diet Carb Modified      Increase activity slowly      Call MD  for:  redness, tenderness, or signs of infection (pain, swelling, redness, odor or green/yellow discharge around incision site)        Current Discharge Medication List    START taking these medications   Details  aspirin 81 MG chewable tablet Chew 1 tablet (81 mg total) by mouth daily.    atorvastatin (LIPITOR) 80 MG tablet Take 1 tablet (80 mg total) by mouth daily. Qty: 30 tablet, Refills: 11    insulin NPH (HUMULIN N,NOVOLIN N) 100 UNIT/ML injection Inject 20 Units into the skin 2 (two) times daily before a meal. Qty: 10 mL,  Refills: 1    lisinopril (PRINIVIL,ZESTRIL) 5 MG tablet Take 1 tablet (5 mg total) by mouth daily. Qty: 30 tablet, Refills: 11    metoprolol (LOPRESSOR) 50 MG tablet Take 1 tablet (50 mg total) by mouth 2 (two) times daily. Qty: 60 tablet, Refills: 11    nitroGLYCERIN (NITROSTAT) 0.4 MG SL tablet Place 1 tablet (0.4 mg total) under the tongue every 5 (five) minutes as needed for chest pain (while SBP > 110 mmHg). Qty: 25 tablet, Refills: 3    prasugrel (EFFIENT) 10 MG TABS Take 1 tablet (10 mg total) by mouth daily. Qty: 30 tablet, Refills: 11      STOP taking these medications     Aspirin-Acetaminophen-Caffeine (GOODYS EXTRA STRENGTH) 520-260-32.5 MG PACK      insulin glargine (LANTUS) 100 UNIT/ML injection      simvastatin (ZOCOR) 40 MG tablet      sitaGLIPtan-metformin (JANUMET) 50-1000 MG per tablet        Follow-up Information    Follow up with SHELTON,KIMBERLY R.. Make an appointment in 7 days. (check your blood sugar 2x a day and report your numbers to Dr. Karlton Lemon)    Contact information:   [address] [place] [address] [place], PA-C, the office will call       BRING ALL MEDICATIONS WITH YOU TO FOLLOW UP APPOINTMENTS  Time spent with patient to include physician time: 57mn Signed: RRosaria Ferries12/09/2010, 11:42 AM  Attending Note:   The patient was seen and examined.  Agree with assessment and plan as noted above.  See my note from previous today.   PThayer Headings JBrooke Bonito, MD, [facility]12/09/2010, 12:48 PM

## 2011-07-20 NOTE — Progress Notes (Signed)
CARE MANAGEMENT NOTE 07/20/2011  Patient:  Derek Lowery, Derek Lowery   Account Number:  [id number]  Date Initiated:  07/20/2011  Documentation initiated by:  [facility]  Subjective/Objective Assessment:   CAD, DM     Action/Plan:   lives at home with wife, has prescription drug coverage   Anticipated DC Date:  07/20/2011   Anticipated DC Plan:  [place]  CM consult  Medication Assistance      Choice offered to / List presented to:             Status of service:  Completed, signed off Medicare Important Message given?   (If response is "NO", the following Medicare IM given date fields will be blank) Date Medicare IM given:   Date Additional Medicare IM given:    Discharge Disposition:  HOME/SELF CARE  Per UR Regulation:    Comments:  07/20/2011 1300 Pt interviewed. States he has drug coverage but has some difficulty with his copay for Lantus. Pt is going home with Effient. Provided pt with a 30 day free sample and copay card for Effient. Explained that certain pharmacies have discount generic meds and also drug manufacture offers assistance for medication. He can discuss with PCP and talk about samples from the office. Jonnie Finner RN CCM Case Mgmt phone [phone]

## 2011-07-20 NOTE — Progress Notes (Addendum)
Subjective:    Derek Lowery is doing very well. He ambulated without any chest pain or shortness of breath.  He was transferred to 3700.  Continues to ambulate without problems  He denies any chest pain or shortness of breath.  He had stenting of his left circumflex marginal vessel yesterday by Dr. Lia Foyer.      Marland Kitchen aspirin  81 mg Oral Daily  . insulin aspart  0-20 Units Subcutaneous TID WC  . insulin aspart  0-5 Units Subcutaneous QHS  . insulin aspart  3 Units Subcutaneous TID WC  . insulin glargine  25 Units Subcutaneous QHS  . lisinopril  5 mg Oral Daily  . metoprolol tartrate  50 mg Oral BID  . prasugrel  10 mg Oral Daily  . rosuvastatin  20 mg Oral q1800  . sodium chloride  3 mL Intravenous Q12H  . DISCONTD: insulin aspart  0-15 Units Subcutaneous Q4H  . DISCONTD: metoprolol tartrate  25 mg Oral BID      Objective:  Vital Signs in the last 24 hours: Blood pressure 108/72, pulse 65, temperature 97.6 F (36.4 C), temperature source Oral, resp. rate 16, height 5' 10"  (1.778 m), weight 211 lb 3.2 oz (95.8 kg), SpO2 98.00%. Temp:  [97.6 F (36.4 C)-98.1 F (36.7 C)] 97.6 F (36.4 C) (12/02 0539) Pulse Rate:  [65-110] 65  (12/02 0539) Resp:  [16-20] 16  (12/02 0539) BP: (98-109)/(63-78) 108/72 mmHg (12/02 0539) SpO2:  [97 %-98 %] 98 % (12/02 0539) Weight:  [211 lb 3.2 oz (95.8 kg)] 211 lb 3.2 oz (95.8 kg) (12/02 0539)  Intake/Output from previous day: 12/01 0701 - 12/02 0700 In: 720 [P.O.:720] Out: 650 [Urine:650] Intake/Output from this shift:    Physical Exam: The patient is alert and oriented x 3.  The mood and affect are normal.   Skin: warm and dry.  Color is normal.    HEENT:   the sclera are nonicteric.  The mucous membranes are moist.  The carotids are 2+ without bruits.  There is no thyromegaly.  There is no JVD.    Lungs: clear.  The chest wall is non tender.    Heart: regular rate with a normal S1 and S2.  There are no murmurs, gallops, or rubs. The  PMI is not displaced.     Abdomen: good bowel sounds.  There is no guarding or rebound.  There is no hepatosplenomegaly or tenderness.  There are no masses.   Extremities:  no clubbing, cyanosis, or edema.  The legs are without rashes.  The distal pulses are intact.   Neuro:  Cranial nerves II - XII are intact.  Motor and sensory functions are intact.     Lab Results:  [facility] 07/19/11 0700 07/17/11 1825  WBC 10.1 6.8  HGB 16.8 14.9  PLT 176 183    Basename 07/19/11 0700 07/18/11 0520  NA 137 132*  K 4.3 4.0  CL 99 98  CO2 24 22  GLUCOSE 233* 290*  BUN 9 10  CREATININE 0.49* 0.50    Basename 07/20/11 0630 07/20/11  TROPONINI 1.74* 3.39*   No results found for this basename: BNP in the last 72 hours Hepatic Function Panel No results found for this basename: PROT,ALBUMIN,AST,ALT,ALKPHOS,BILITOT,BILIDIR,IBILI in the last 72 hours Lab Results  Component Value Date   CHOL 239* 07/18/2011   HDL 32* 07/18/2011   LDLCALC 171* 07/18/2011   TRIG 181* 07/18/2011   CHOLHDL 7.5 07/18/2011    Basename 07/17/11 1530  INR 0.98     Assessment/Plan:   1. Coronary artery disease the patient had a complex obtuse marginal coronary artery blockage. The obtuse marginal artery bifurcated into 2 branches.  The superior branch was completely occluded and was left alone. The inferior branch had a complex 90% stenosis and this branch was stented. He's done well and has not had any episodes of chest pain. His troponin levels are gradually decreasing.  Disposition: DC home today  Lantus 40 at bedtime Check glucose BID, follow up with Dr. Heath Gold in a week metoprolol to 50 mg twice a day. lisinopril 5 mg a day Change crestor to lipitor 80 QHS  ( cost issue)  Continue Prasugril ( may need assistace) Aspirin 81 NTG .4 prn Return to work in 1 week - patient needs return to work note.  follow up with Dr. Lia Foyer in 2-3 weeks  DC planning > 30 minutes   Derek Lowery, Derek Bonito., MD,  [facility] 07/20/2011, 9:52 AM LOS: Day 3

## 2011-07-20 NOTE — Consult Note (Signed)
[facility] CONSULT F/U NOTE  Outpatient Primary Care MD:  Derek Gold, MD  Subjective: The patient is doing well.  He has no complaints.  He denies f/c, sob, n/v, or abdom pain.  He reports that he was unable to comply with his lantus dosing due to financial concerns (lantus cost him $60/month).  Objective: Weight change: 0 kg (0 lb)  Intake/Output Summary (Last 24 hours) at 07/20/11 1005 Last data filed at 07/19/11 2300  Gross per 24 hour  Intake    360 ml  Output    650 ml  Net   -290 ml   Blood pressure 108/72, pulse 65, temperature 97.6 F (36.4 C), temperature source Oral, resp. rate 16, height 5' 10"  (1.778 m), weight 95.8 kg (211 lb 3.2 oz), SpO2 98.00%. Temp:  [97.6 F (36.4 C)-98.1 F (36.7 C)] 97.6 F (36.4 C) (12/02 0539) Pulse Rate:  [65-110] 65  (12/02 0539) Resp:  [16-20] 16  (12/02 0539) BP: (98-109)/(63-78) 108/72 mmHg (12/02 0539) SpO2:  [97 %-98 %] 98 % (12/02 0539) Weight:  [95.8 kg (211 lb 3.2 oz)] 211 lb 3.2 oz (95.8 kg) (12/02 0539)  Physical Exam: General: No acute respiratory distress Lungs: Clear to auscultation bilaterally without wheezes or crackles Cardiovascular: Regular rate and rhythm without murmur gallop or rub normal S1 and S2 Abdomen: Nontender, nondistended, soft, bowel sounds positive, no rebound, no ascites, no appreciable mass Extremities: No significant cyanosis, clubbing, or edema bilateral lower extremities  Lab Results:  Basename 07/19/11 0700 07/18/11 0520 07/17/11 1825 07/17/11 1542  NA 137 132* -- 133*  K 4.3 4.0 -- 4.5  CL 99 98 -- 98  CO2 24 22 -- --  GLUCOSE 233* 290* -- 481*  BUN 9 10 -- 13  CREATININE 0.49* 0.50 0.64 --  CALCIUM 9.0 9.3 -- --  MG -- -- -- --  PHOS -- -- -- --   No results found for this basename: AST:2,ALT:2,ALKPHOS:2,BILITOT:2,PROT:2,ALBUMIN:2 in the last 72 hours No results found for this basename: LIPASE:2,AMYLASE:2 in the last 72 hours  Basename 07/19/11 0700 07/17/11 1825 07/17/11  1542 07/17/11 1530  WBC 10.1 6.8 -- 6.1  NEUTROABS -- -- -- --  HGB 16.8 14.9 16.7 --  HCT 46.5 41.7 49.0 --  MCV 83.8 83.1 -- 82.1  PLT 176 183 -- 168    Basename 07/20/11 0630 07/20/11 07/19/11 1806  CKTOTAL 78 95 129  CKMB 7.0* 8.1* 11.5*  CKMBINDEX -- -- --  TROPONINI 1.74* 3.39* 3.48*   No results found for this basename: POCBNP:3 in the last 72 hours No results found for this basename: DDIMER:2 in the last 72 hours  Basename 07/18/11 0520  HGBA1C 13.1*    Basename 07/18/11 0520  CHOL 239*  HDL 32*  LDLCALC 171*  TRIG 181*  CHOLHDL 7.5  LDLDIRECT --    Basename 07/18/11 0520  TSH 0.880  T4TOTAL --  T3FREE --  THYROIDAB --   No results found for this basename: VITAMINB12:2,FOLATE:2,FERRITIN:2,TIBC:2,IRON:2,RETICCTPCT:2 in the last 72 hours  Micro Results: Recent Results (from the past 240 hour(s))  MRSA PCR SCREENING     Status: Normal   Collection Time   07/18/11  5:49 PM      Component Value Range Status Comment   MRSA by PCR NEGATIVE  NEGATIVE  Final     Studies/Results: Scheduled Meds:    . aspirin  81 mg Oral Daily  . insulin aspart  0-20 Units Subcutaneous TID WC  . insulin aspart  0-5 Units  Subcutaneous QHS  . insulin aspart  3 Units Subcutaneous TID WC  . insulin glargine  25 Units Subcutaneous QHS  . lisinopril  5 mg Oral Daily  . metoprolol tartrate  50 mg Oral BID  . prasugrel  10 mg Oral Daily  . rosuvastatin  20 mg Oral q1800  . sodium chloride  3 mL Intravenous Q12H  . DISCONTD: insulin aspart  0-15 Units Subcutaneous Q4H  . DISCONTD: metoprolol tartrate  25 mg Oral BID   Continuous Infusions:  PRN Meds:.acetaminophen, morphine, nitroGLYCERIN, ondansetron (ZOFRAN) IV  Recommendations:  Coronary artery disease-as per primary service  DM (diabetes mellitus)/uncontrolled-the patient's hemaglobin A1c is 13!-He admits to noncompliance with his home insulin regimen and states he essentially has taken nothing for one year-he is also  been noncompliant with following up with Derek Lowery reports that he can easily follow Derek Lowery and wishes to do so for his ongoing medical care  Due to financial concerns, we will need to change his D/C meds to the following: NPH insulin 20units BID  Check your CBG BID F/U w/ Derek Lowery in 7-10 days with your CBG log Continue Ace Inhib + ASA as per Cardiology service  HLD (hyperlipidemia)-on medical treatment as per primary  Essential hypertension-titration of medication as per primary service-currently well controlled  Disposition: From my standpoint the patient is medically stable for d/c home    LOS: 3 days   Derek Lowery T 07/20/2011, 10:05 AM

## 2011-07-21 MED FILL — Dextrose Inj 5%: INTRAVENOUS | Qty: 50 | Status: AC

## 2011-09-08 NOTE — Telephone Encounter (Signed)
Call  [facility] pharmacy to let them know to fax to rx refill of novolin 100 units to patient primary md.

## 2011-09-16 ENCOUNTER — Encounter (HOSPITAL_COMMUNITY): Payer: Self-pay | Admitting: *Deleted

## 2011-09-16 ENCOUNTER — Other Ambulatory Visit: Payer: Self-pay

## 2011-09-16 ENCOUNTER — Emergency Department (HOSPITAL_COMMUNITY)
Admission: EM | Admit: 2011-09-16 | Discharge: 2011-09-16 | Disposition: A | Discharge status: Home / Self Care | Payer: BC Managed Care – PPO | Attending: Emergency Medicine | Admitting: Emergency Medicine

## 2011-09-16 DIAGNOSIS — Z79899 Other long term (current) drug therapy: Secondary | ICD-10-CM | POA: Insufficient documentation

## 2011-09-16 DIAGNOSIS — E119 Type 2 diabetes mellitus without complications: Secondary | ICD-10-CM | POA: Insufficient documentation

## 2011-09-16 DIAGNOSIS — R112 Nausea with vomiting, unspecified: Secondary | ICD-10-CM

## 2011-09-16 DIAGNOSIS — I252 Old myocardial infarction: Secondary | ICD-10-CM | POA: Insufficient documentation

## 2011-09-16 DIAGNOSIS — I10 Essential (primary) hypertension: Secondary | ICD-10-CM | POA: Insufficient documentation

## 2011-09-16 LAB — POCT I-STAT, CHEM 8
BUN: 13 mg/dL (ref 6–23)
Calcium, Ion: 1.09 mmol/L — ABNORMAL LOW (ref 1.12–1.32)
Chloride: 103 mEq/L (ref 96–112)
HCT: 40 % (ref 39.0–52.0)
Sodium: 138 mEq/L (ref 135–145)

## 2011-09-16 LAB — GLUCOSE, CAPILLARY: Glucose-Capillary: 243 mg/dL — ABNORMAL HIGH (ref 70–99)

## 2011-09-16 LAB — POCT I-STAT TROPONIN I: Troponin i, poc: 0.02 ng/mL (ref 0.00–0.08)

## 2011-09-16 LAB — CBC
MCH: 28.9 pg (ref 26.0–34.0)
MCHC: 35.7 g/dL (ref 30.0–36.0)
RDW: 12.2 % (ref 11.5–15.5)

## 2011-09-16 LAB — DIFFERENTIAL
Basophils Absolute: 0 10*3/uL (ref 0.0–0.1)
Basophils Relative: 1 % (ref 0–1)
Lymphocytes Relative: 24 % (ref 12–46)
Neutro Abs: 4.1 10*3/uL (ref 1.7–7.7)
Neutrophils Relative %: 66 % (ref 43–77)

## 2011-09-16 MED ORDER — ONDANSETRON HCL 4 MG PO TABS: 4.0000 mg | ORAL_TABLET | Freq: Four times a day (QID) | ORAL | Status: AC

## 2011-09-16 MED ORDER — ONDANSETRON HCL 4 MG/2ML IJ SOLN
4.0000 mg | INTRAMUSCULAR | Status: DC | PRN
  Administered 2011-09-16: 4 mg via INTRAVENOUS
  Filled 2011-09-16: qty 2

## 2011-09-16 MED ORDER — SODIUM CHLORIDE 0.9 % IV BOLUS (SEPSIS)
500.0000 mL | INTRAVENOUS | Status: AC
  Administered 2011-09-16: 500 mL via INTRAVENOUS

## 2011-09-16 NOTE — ED Notes (Signed)
Pt states "I've been vomiting every other day since Sunday before last"

## 2011-09-16 NOTE — ED Notes (Signed)
Tolerate po intake-no n/v-denies pain

## 2011-09-16 NOTE — ED Provider Notes (Signed)
History     CSN: [id number]  Arrival date & time 09/16/11  1542   First MD Initiated Contact with Patient 09/16/11 1647      Chief Complaint  Patient presents with  . Emesis    (Consider location/radiation/quality/duration/timing/severity/associated sxs/prior treatment) HPI Comments: Patient with a history of noncompliance diabetes and non-STEMI presents to the emergency department with chief complaint of emesis.  Patient states he's been throwing up for the past week every other day a couple times.  Patient denies any abdominal pain, urinary symptoms including frequency, urgency, hematuria.  Patient denies hematemesis or hematochezia.  Patient states he has been compliant with his medication and that he is currently taking regular insulin.  Patient denies fevers, night sweats, chills, chest pain, dyspnea on exertion, he reports shortness of breath with dry heaving but denies leg swelling, or claudication.  Patient had a non-semi-on 07/17/2011 where he was discharged with HTN medications and Effient.  The patient's history of noncompliance with medication has to do with inability to afford medications, but he repots he has been able to take all medications as directed    Patient is a 56 y.o. male presenting with vomiting. The history is provided by the patient.  Emesis  This is a new problem. The current episode started more than 2 days ago. Episode frequency: 1-2 x a day, q other day. The problem has not changed since onset.The emesis has an appearance of stomach contents. There has been no fever. Pertinent negatives include no abdominal pain, no arthralgias, no chills, no cough, no diarrhea, no fever, no headaches, no myalgias, no sweats and no URI.    Past Medical History  Diagnosis Date  . Hypercholesteremia   . Type 2 diabetes mellitus 1990  . Internal hemorrhoids   . Essential hypertension, benign   . Noncompliance   . Diabetes mellitus   . Heart attack     Past Surgical  History  Procedure Date  . Shoulder surgery   . Colonoscopy 2000    Dr. Collene Mares    Family History  Problem Relation Age of Onset  . Coronary artery disease Father     Developed in his 74s  . Kidney disease Father   . Diabetes Father   . Heart failure Mother   . Hypertension Mother   . Diabetes Sister   . Diabetes Brother   . Melanoma Father     History  Substance Use Topics  . Smoking status: Never Smoker   . Smokeless tobacco: Never Used  . Alcohol Use: No      Review of Systems  Constitutional: Positive for appetite change. Negative for fever and chills.  HENT: Negative for neck stiffness and dental problem.   Eyes: Negative for visual disturbance.  Respiratory: Negative for cough, chest tightness, shortness of breath and wheezing.   Cardiovascular: Negative for chest pain.  Gastrointestinal: Positive for nausea and vomiting. Negative for abdominal pain, diarrhea, constipation, blood in stool, abdominal distention, anal bleeding and rectal pain.  Genitourinary: Negative for dysuria, urgency, hematuria and flank pain.  Musculoskeletal: Negative for myalgias and arthralgias.  Skin: Negative for rash.  Neurological: Negative for dizziness, syncope, speech difficulty, numbness and headaches.  Hematological: Does not bruise/bleed easily.  All other systems reviewed and are negative.    Allergies  Review of patient's allergies indicates no known allergies.  Home Medications   Current Outpatient Rx  Name Route Sig Dispense Refill  . ASPIRIN 81 MG PO CHEW Oral Chew 1 tablet (81 mg  total) by mouth daily.    . ATORVASTATIN CALCIUM 80 MG PO TABS Oral Take 1 tablet (80 mg total) by mouth daily. 30 tablet 11  . INSULIN ISOPHANE HUMAN 100 UNIT/ML [place] SUSP Subcutaneous Inject 20 Units into the skin 2 (two) times daily before a meal. 10 mL 1  . LISINOPRIL 5 MG PO TABS Oral Take 1 tablet (5 mg total) by mouth daily. 30 tablet 11  . METOPROLOL TARTRATE 50 MG PO TABS Oral Take 1  tablet (50 mg total) by mouth 2 (two) times daily. 60 tablet 11  . NITROGLYCERIN 0.4 MG SL SUBL Sublingual Place 1 tablet (0.4 mg total) under the tongue every 5 (five) minutes as needed for chest pain (while SBP > 110 mmHg). 25 tablet 3  . PRASUGREL HCL 10 MG PO TABS Oral Take 1 tablet (10 mg total) by mouth daily. 30 tablet 0    BP 133/80  Pulse 69  Temp(Src) 97.8 F (36.6 C) (Oral)  Resp 16  Ht 5' 10"  (1.778 m)  Wt 214 lb (97.07 kg)  BMI 30.71 kg/m2  SpO2 100%  Physical Exam  Nursing note and vitals reviewed. Constitutional: He is oriented to person, place, and time. He appears well-developed and well-nourished. No distress.  HENT:  Head: Normocephalic and atraumatic.  Mouth/Throat: Oropharynx is clear and moist. No oropharyngeal exudate.  Eyes: Conjunctivae and EOM are normal. Pupils are equal, round, and reactive to light. No scleral icterus.  Neck: Normal range of motion. Neck supple. No tracheal deviation present. No thyromegaly present.  Cardiovascular: Normal rate, regular rhythm, normal heart sounds and intact distal pulses.   Pulmonary/Chest: Effort normal and breath sounds normal. No stridor. No respiratory distress. He has no wheezes.  Abdominal: Soft. Bowel sounds are normal. He exhibits no distension and no mass. There is no tenderness. There is no rebound and no guarding.  Musculoskeletal: Normal range of motion. He exhibits no edema and no tenderness.  Neurological: He is alert and oriented to person, place, and time. Coordination normal.  Skin: Skin is warm and dry. No rash noted. He is not diaphoretic. No erythema. No pallor.  Psychiatric: He has a normal mood and affect. His behavior is normal.    ED Course  Procedures (including critical care time)  Labs Reviewed  GLUCOSE, CAPILLARY - Abnormal; Notable for the following:    Glucose-Capillary 243 (*)    All other components within normal limits  CBC - Abnormal; Notable for the following:    HCT 38.7 (*)     All other components within normal limits  POCT I-STAT, CHEM 8 - Abnormal; Notable for the following:    Glucose, Bld 267 (*)    Calcium, Ion 1.09 (*)    All other components within normal limits  DIFFERENTIAL  POCT I-STAT TROPONIN I   No results found.   No diagnosis found.   MDM  Nausea vomiting   Patient's results have been explained including his glucose of 267.  The patient is currently not having any abdominal pain or tenderness to deep palpation.  Patient does not appear to be having a cardiac cause of his nausea and vomiting because of the normal EKG and negative troponin.  Patient is able to tolerate by mouth fluids greater than 6 ounces and will be discharged with antibiotics.  Patient has been instructed to return to the emergency department if he begins to develop abdominal pain that localizes to any area of his stomach.  He verbalizes understanding.  Verl Dicker, [place] 09/16/11 1901

## 2011-09-16 NOTE — ED Notes (Signed)
Pt states n/v on/off for one week-states improvement till this am-states he thinks it might be related to med which he started in November s/p stent placement

## 2011-09-17 NOTE — ED Provider Notes (Signed)
Evaluation and management procedures were performed by the PA/NP under my supervision/collaboration.   Charlena Cross, MD 09/17/11 2016

## 2011-10-13 ENCOUNTER — Ambulatory Visit (INDEPENDENT_AMBULATORY_CARE_PROVIDER_SITE_OTHER): Payer: BC Managed Care – PPO | Admitting: Physician Assistant

## 2011-10-13 ENCOUNTER — Encounter: Payer: Self-pay | Admitting: Physician Assistant

## 2011-10-13 VITALS — BP 132/78 | HR 71 | Resp 12 | Ht 70.0 in | Wt 216.0 lb

## 2011-10-13 DIAGNOSIS — I10 Essential (primary) hypertension: Secondary | ICD-10-CM

## 2011-10-13 DIAGNOSIS — E119 Type 2 diabetes mellitus without complications: Secondary | ICD-10-CM

## 2011-10-13 DIAGNOSIS — E785 Hyperlipidemia, unspecified: Secondary | ICD-10-CM

## 2011-10-13 DIAGNOSIS — I2510 Atherosclerotic heart disease of native coronary artery without angina pectoris: Secondary | ICD-10-CM

## 2011-10-13 NOTE — Assessment & Plan Note (Addendum)
He is doing well post MI.  We discussed the importance of dual antiplatelet therapy.  He admits to compliance with his ASA and Effient.  He is not interested in cardiac rehab.  We discussed the importance of remaining active and weight control.  I have given him a goal of losing 10 lbs.  We discussed different strategies to lose weight today.  Follow up with Dr.  [name] in 3 mos.

## 2011-10-13 NOTE — Assessment & Plan Note (Signed)
Managed by primary care.

## 2011-10-13 NOTE — Progress Notes (Signed)
Derek [place], [place]  [id number] Phone: [phone] Fax:  [phone]  Date:  10/13/2011   Name:  Derek Lowery       DOB:  [birth year] MRN:  [id number]  PCP:  Janne Napoleon, PA-C at Dr. Helyn App office  Primary Cardiologist:  Dr.  [name]  Primary Electrophysiologist:  None    History of Present Illness: Derek Lowery is a 56 y.o. male who presents for post hospital follow up.  He has a h/o HTN, DM2 and hyperlipidemia.  He was admitted 11/29-12/2 with an NSTEMI.  LHC 07/18/11: Proximal diagonal 60%, distal LAD with a diabetic appearance and 60% stenosis, OM2 with an occluded superior branch and an inferior branch with 90%, EF 55% with inferior hypokinesis.  PCI: Promus DES to the OM2 inferior branch.  This vessel provides collaterals to the superior branch which remained occluded.  Echocardiogram 07/18/11: EF 60%, normal wall motion.  Labs: TC 239, HDL 32, LDL 171, TG 181, A1c 13.1, TSH 0.80.  IM followed him and helped adjust his diabetic medications.  He presented to the emergency room 09/16/11 with nausea and vomiting.  Labs from that visit: Hemoglobin 13.6, potassium 3.8, creatinine 0.60, troponin 0.02.  This is his first visit to our office since his MI.    He is doing well.  The patient denies chest pain, shortness of breath, syncope, orthopnea, PND or significant pedal edema.   He is trying to remain as active as possible.  He works at [employer] from [employer].  He does not smoke.    Past Medical History  Diagnosis Date  . Hypercholesteremia   . Type 2 diabetes mellitus 1990  . Internal hemorrhoids   . Essential hypertension, benign   . Noncompliance   . CAD (coronary artery disease)     NSTEMI 06/2011:  LHC 07/18/11: Proximal diagonal 60%, distal LAD with a diabetic appearance and 60% stenosis, OM2 with an occluded superior branch and an inferior branch with 90%, EF 55% with inferior hypokinesis.  PCI: Promus DES to the OM2 inferior  branch.  This vessel provides collaterals to the superior branch which remained occluded.  Echocardiogram 07/18/11: EF 60%, normal wall motion.    Current Outpatient Prescriptions  Medication Sig Dispense Refill  . aspirin 81 MG chewable tablet Chew 1 tablet (81 mg total) by mouth daily.      Marland Kitchen atorvastatin (LIPITOR) 80 MG tablet Take 1 tablet (80 mg total) by mouth daily.  30 tablet  11  . insulin NPH (HUMULIN N,NOVOLIN N) 100 UNIT/ML injection Inject 20 Units into the skin 2 (two) times daily before a meal.  10 mL  1  . lisinopril (PRINIVIL,ZESTRIL) 5 MG tablet Take 1 tablet (5 mg total) by mouth daily.  30 tablet  11  . metoprolol (LOPRESSOR) 50 MG tablet Take 1 tablet (50 mg total) by mouth 2 (two) times daily.  60 tablet  11  . nitroGLYCERIN (NITROSTAT) 0.4 MG SL tablet Place 1 tablet (0.4 mg total) under the tongue every 5 (five) minutes as needed for chest pain (while SBP > 110 mmHg).  25 tablet  3  . prasugrel (EFFIENT) 10 MG TABS Take 1 tablet (10 mg total) by mouth daily.  30 tablet  0    Allergies: No Known Allergies  History  Substance Use Topics  . Smoking status: Never Smoker   . Smokeless tobacco: Never Used  . Alcohol Use: No     PHYSICAL EXAM: VS:  BP 132/78  Pulse 71  Resp 12  Ht 5' 10"  (1.778 m)  Wt 216 lb (97.977 kg)  BMI 30.99 kg/m2 Well nourished, well developed, in no acute distress HEENT: normal Neck: no JVD Cardiac:  normal S1, S2; RRR; no murmur Lungs:  clear to auscultation bilaterally, no wheezing, rhonchi or rales Abd: soft, nontender, no hepatomegaly Ext: no edema; right wrist without hematoma or bruit Skin: warm and dry Neuro:  CNs 2-12 intact, no focal abnormalities noted  EKG:  Sinus rhythm, heart rate 74, right axis deviation, no acute changes and no change from prior tracing  ASSESSMENT AND PLAN:

## 2011-10-13 NOTE — Patient Instructions (Addendum)
Your physician wants you to follow-up in: 3 months with Dr Lia Foyer.  You will receive a reminder letter in the mail two months in advance. If you don't receive a letter, please call our office to schedule the follow-up appointment.  Your physician recommends that you return for lab work in: lipid, liver and bmet if not done at your primary care physician's office.

## 2011-10-13 NOTE — Assessment & Plan Note (Signed)
Borderline control.  Continue current medications.  He just had labs with his PCP.  We will try to obtain.  If BMET not done, will request he come back for this.

## 2011-10-13 NOTE — Assessment & Plan Note (Signed)
Obtain recent labs from PCP.  If FLP and LFTs not done, will arrange.

## 2011-12-22 ENCOUNTER — Encounter: Payer: Self-pay | Admitting: Cardiology

## 2011-12-22 ENCOUNTER — Ambulatory Visit (INDEPENDENT_AMBULATORY_CARE_PROVIDER_SITE_OTHER): Payer: BC Managed Care – PPO | Admitting: Cardiology

## 2011-12-22 VITALS — BP 120/78 | HR 73 | Ht 70.0 in | Wt 215.4 lb

## 2011-12-22 DIAGNOSIS — I2510 Atherosclerotic heart disease of native coronary artery without angina pectoris: Secondary | ICD-10-CM

## 2011-12-22 DIAGNOSIS — I10 Essential (primary) hypertension: Secondary | ICD-10-CM

## 2011-12-22 DIAGNOSIS — E119 Type 2 diabetes mellitus without complications: Secondary | ICD-10-CM

## 2011-12-22 DIAGNOSIS — E7800 Pure hypercholesterolemia, unspecified: Secondary | ICD-10-CM

## 2011-12-22 DIAGNOSIS — E785 Hyperlipidemia, unspecified: Secondary | ICD-10-CM

## 2011-12-22 NOTE — Assessment & Plan Note (Signed)
Followed by primary care.

## 2011-12-22 NOTE — Patient Instructions (Addendum)
Your physician recommends that you return for a FASTING LIPID, LIVER and BMP--nothing to eat or drink after midnight, lab opens at 8:30 (12/26/11)  Your physician wants you to follow-up in: 3 MONTHS with Dr Lia Foyer. You will receive a reminder letter in the mail two months in advance. If you don't receive a letter, please call our office to schedule the follow-up appointment.  Your physician recommends that you continue on your current medications as directed. Please refer to the Current Medication list given to you today.

## 2011-12-22 NOTE — Progress Notes (Signed)
HPI:  He is doing well.  He had some tightness during the winter when he would get out in the cold, but he has had none for over a month.  No rest symptoms.  No bleeding.    Current Outpatient Prescriptions  Medication Sig Dispense Refill  . aspirin 81 MG chewable tablet Chew 1 tablet (81 mg total) by mouth daily.      Marland Kitchen atorvastatin (LIPITOR) 80 MG tablet Take 1 tablet (80 mg total) by mouth daily.  30 tablet  11  . insulin NPH (HUMULIN N,NOVOLIN N) 100 UNIT/ML injection Inject 20 Units into the skin 2 (two) times daily before a meal.  10 mL  1  . lisinopril (PRINIVIL,ZESTRIL) 5 MG tablet Take 1 tablet (5 mg total) by mouth daily.  30 tablet  11  . metoprolol (LOPRESSOR) 50 MG tablet Take 1 tablet (50 mg total) by mouth 2 (two) times daily.  60 tablet  11  . nitroGLYCERIN (NITROSTAT) 0.4 MG SL tablet Place 1 tablet (0.4 mg total) under the tongue every 5 (five) minutes as needed for chest pain (while SBP > 110 mmHg).  25 tablet  3  . prasugrel (EFFIENT) 10 MG TABS Take 1 tablet (10 mg total) by mouth daily.  30 tablet  0    No Known Allergies  Past Medical History  Diagnosis Date  . Hypercholesteremia   . Type 2 diabetes mellitus 1990  . Internal hemorrhoids   . Essential hypertension, benign   . Noncompliance   . CAD (coronary artery disease)     NSTEMI 06/2011:  LHC 07/18/11: Proximal diagonal 60%, distal LAD with a diabetic appearance and 60% stenosis, OM2 with an occluded superior branch and an inferior branch with 90%, EF 55% with inferior hypokinesis.  PCI: Promus DES to the OM2 inferior branch.  This vessel provides collaterals to the superior branch which remained occluded.  Echocardiogram 07/18/11: EF 60%, normal wall motion.    Past Surgical History  Procedure Date  . Shoulder surgery   . Colonoscopy 2000    Dr. Collene Mares    Family History  Problem Relation Age of Onset  . Coronary artery disease Father     Developed in his 59s  . Kidney disease Father   . Diabetes  Father   . Heart failure Mother   . Hypertension Mother   . Diabetes Sister   . Diabetes Brother   . Melanoma Father     History   Social History  . Marital Status: Single    Spouse Name: N/A    Number of Children: N/A  . Years of Education: N/A   Occupational History  . Janitor    Social History Main Topics  . Smoking status: Never Smoker   . Smokeless tobacco: Never Used  . Alcohol Use: No  . Drug Use: No  . Sexually Active: No   Other Topics Concern  . Not on file   Social History Narrative   Works at [employer]    ROS: Please see the HPI.  All other systems reviewed and negative.  PHYSICAL EXAM:  BP 120/78  Pulse 73  Ht 5' 10"  (1.778 m)  Wt 215 lb 6.4 oz (97.705 kg)  BMI 30.91 kg/m2  General: Well developed, well nourished, in no acute distress. Head:  Normocephalic and atraumatic. Neck: no JVD Lungs: Clear to auscultation and percussion. Heart: Normal S1 and S2.  No murmur, rubs or gallops.  Pulses: Pulses normal in all 4 extremities.  Extremities: No clubbing or cyanosis. No edema. Neurologic: Alert and oriented x 3.  EKG:  NSR.  IRBBB.  No acute changes.   ASSESSMENT AND PLAN:

## 2011-12-22 NOTE — Assessment & Plan Note (Signed)
Stable at present.  On atorvastatin.  Need to check lipid and liver.  I did discuss one of the CTEP inhibitor trials with him. TS

## 2011-12-22 NOTE — Assessment & Plan Note (Signed)
Has some mild angina.  Takes NTG sporadically.  None recently as the wether has warmed up.

## 2011-12-22 NOTE — Assessment & Plan Note (Signed)
Controlled.  

## 2011-12-26 ENCOUNTER — Other Ambulatory Visit (INDEPENDENT_AMBULATORY_CARE_PROVIDER_SITE_OTHER): Payer: BC Managed Care – PPO

## 2011-12-26 DIAGNOSIS — I2510 Atherosclerotic heart disease of native coronary artery without angina pectoris: Secondary | ICD-10-CM

## 2011-12-26 DIAGNOSIS — E7800 Pure hypercholesterolemia, unspecified: Secondary | ICD-10-CM

## 2011-12-26 LAB — BASIC METABOLIC PANEL
BUN: 15 mg/dL (ref 6–23)
Chloride: 102 mEq/L (ref 96–112)
Creatinine, Ser: 0.7 mg/dL (ref 0.4–1.5)
GFR: 131.41 mL/min (ref >60.00)
Potassium: 4.4 mEq/L (ref 3.5–5.1)

## 2011-12-26 LAB — LIPID PANEL
Cholesterol: 132 mg/dL (ref 0–200)
LDL Cholesterol: 78 mg/dL (ref 0–99)
Total CHOL/HDL Ratio: 3
Triglycerides: 79 mg/dL (ref 0.0–149.0)
VLDL: 15.8 mg/dL (ref 0.0–40.0)

## 2011-12-26 LAB — HEPATIC FUNCTION PANEL
ALT: 14 U/L (ref 0–53)
Bilirubin, Direct: 0.1 mg/dL (ref 0.0–0.3)
Total Bilirubin: 1.4 mg/dL — ABNORMAL HIGH (ref 0.3–1.2)

## 2012-04-06 ENCOUNTER — Ambulatory Visit: Payer: BC Managed Care – PPO | Admitting: Cardiology

## 2012-04-23 ENCOUNTER — Ambulatory Visit: Payer: BC Managed Care – PPO | Admitting: Cardiology

## 2012-07-09 ENCOUNTER — Emergency Department (HOSPITAL_COMMUNITY)
Admission: EM | Admit: 2012-07-09 | Discharge: 2012-07-10 | Disposition: A | Discharge status: Home / Self Care | Payer: BC Managed Care – PPO | Attending: Emergency Medicine | Admitting: Emergency Medicine

## 2012-07-09 ENCOUNTER — Encounter (HOSPITAL_COMMUNITY): Payer: Self-pay | Admitting: Emergency Medicine

## 2012-07-09 DIAGNOSIS — Z7982 Long term (current) use of aspirin: Secondary | ICD-10-CM | POA: Insufficient documentation

## 2012-07-09 DIAGNOSIS — R42 Dizziness and giddiness: Secondary | ICD-10-CM | POA: Insufficient documentation

## 2012-07-09 DIAGNOSIS — R109 Unspecified abdominal pain: Secondary | ICD-10-CM | POA: Insufficient documentation

## 2012-07-09 DIAGNOSIS — I10 Essential (primary) hypertension: Secondary | ICD-10-CM | POA: Insufficient documentation

## 2012-07-09 DIAGNOSIS — Z8719 Personal history of other diseases of the digestive system: Secondary | ICD-10-CM | POA: Insufficient documentation

## 2012-07-09 DIAGNOSIS — E7800 Pure hypercholesterolemia, unspecified: Secondary | ICD-10-CM | POA: Insufficient documentation

## 2012-07-09 DIAGNOSIS — Z794 Long term (current) use of insulin: Secondary | ICD-10-CM | POA: Insufficient documentation

## 2012-07-09 DIAGNOSIS — E119 Type 2 diabetes mellitus without complications: Secondary | ICD-10-CM | POA: Insufficient documentation

## 2012-07-09 DIAGNOSIS — I2510 Atherosclerotic heart disease of native coronary artery without angina pectoris: Secondary | ICD-10-CM | POA: Insufficient documentation

## 2012-07-09 DIAGNOSIS — R112 Nausea with vomiting, unspecified: Secondary | ICD-10-CM

## 2012-07-09 DIAGNOSIS — Z79899 Other long term (current) drug therapy: Secondary | ICD-10-CM | POA: Insufficient documentation

## 2012-07-09 DIAGNOSIS — R197 Diarrhea, unspecified: Secondary | ICD-10-CM | POA: Insufficient documentation

## 2012-07-09 NOTE — ED Notes (Signed)
Pt c/o vomiting x 1 in last 24 hours, +nausea with diarrhea x 2 months. Intermittent pain L side. PWD, VSS

## 2012-07-10 ENCOUNTER — Emergency Department (HOSPITAL_COMMUNITY): Payer: BC Managed Care – PPO

## 2012-07-10 LAB — COMPREHENSIVE METABOLIC PANEL
ALT: 7 U/L (ref 0–53)
AST: 12 U/L (ref 0–37)
Albumin: 3.7 g/dL (ref 3.5–5.2)
Alkaline Phosphatase: 47 U/L (ref 39–117)
BUN: 12 mg/dL (ref 6–23)
Chloride: 101 mEq/L (ref 96–112)
Potassium: 3.3 mEq/L — ABNORMAL LOW (ref 3.5–5.1)
Sodium: 141 mEq/L (ref 135–145)
Total Bilirubin: 0.6 mg/dL (ref 0.3–1.2)
Total Protein: 6.5 g/dL (ref 6.0–8.3)

## 2012-07-10 LAB — CBC WITH DIFFERENTIAL/PLATELET
Basophils Absolute: 0.1 10*3/uL (ref 0.0–0.1)
Basophils Relative: 1 % (ref 0–1)
Eosinophils Absolute: 0.2 10*3/uL (ref 0.0–0.7)
Hemoglobin: 13.5 g/dL (ref 13.0–17.0)
MCH: 29.2 pg (ref 26.0–34.0)
MCHC: 34.9 g/dL (ref 30.0–36.0)
Neutro Abs: 4.7 10*3/uL (ref 1.7–7.7)
Neutrophils Relative %: 62 % (ref 43–77)
Platelets: 162 10*3/uL (ref 150–400)
RDW: 12.7 % (ref 11.5–15.5)

## 2012-07-10 MED ORDER — SODIUM CHLORIDE 0.9 % IV BOLUS (SEPSIS)
500.0000 mL | Freq: Once | INTRAVENOUS | Status: AC
  Administered 2012-07-10: 500 mL via INTRAVENOUS

## 2012-07-10 MED ORDER — ONDANSETRON 8 MG PO TBDP: 8.0000 mg | ORAL_TABLET | Freq: Three times a day (TID) | ORAL | Status: DC | PRN

## 2012-07-10 MED ORDER — ONDANSETRON HCL 4 MG/2ML IJ SOLN
4.0000 mg | Freq: Once | INTRAMUSCULAR | Status: AC
Start: 2012-07-10 — End: 2012-07-10
  Administered 2012-07-10: 4 mg via INTRAVENOUS
  Filled 2012-07-10: qty 2

## 2012-07-10 NOTE — ED Provider Notes (Signed)
History     CSN: [id number]  Arrival date & time 07/09/12  2215   First MD Initiated Contact with Patient 07/10/12 0039      Chief Complaint  Patient presents with  . Emesis  . Diarrhea    (Consider location/radiation/quality/duration/timing/severity/associated sxs/prior treatment) Patient is a 56 y.o. male presenting with vomiting and diarrhea. The history is provided by the patient.  Emesis  This is a new problem. Associated symptoms include abdominal pain and diarrhea. Pertinent negatives include no headaches.  Diarrhea The primary symptoms include abdominal pain, nausea, vomiting and diarrhea. Primary symptoms do not include fatigue or rash.  The illness does not include back pain.   patient developed vomiting today. He's also had some nausea since. He set some mild nauseousness with diarrhea for the last 2 months. He'll occasionally have pain in his left upper abdomen. He states the pain is associated with the diarrhea. He states his abdomen which tends to have diarrhea. No fevers. No cough. He states he does feel a week. He was recently started back on metformin and the diarrhea started after that. No fevers. No recent hospitalizations. He has not recently been on antibiotics  Past Medical History  Diagnosis Date  . Hypercholesteremia   . Type 2 diabetes mellitus 1990  . Internal hemorrhoids   . Essential hypertension, benign   . Noncompliance   . CAD (coronary artery disease)     NSTEMI 06/2011:  LHC 07/18/11: Proximal diagonal 60%, distal LAD with a diabetic appearance and 60% stenosis, OM2 with an occluded superior branch and an inferior branch with 90%, EF 55% with inferior hypokinesis.  PCI: Promus DES to the OM2 inferior branch.  This vessel provides collaterals to the superior branch which remained occluded.  Echocardiogram 07/18/11: EF 60%, normal wall motion.    Past Surgical History  Procedure Date  . Shoulder surgery   . Colonoscopy 2000    Dr. Collene Mares     Family History  Problem Relation Age of Onset  . Coronary artery disease Father     Developed in his 24s  . Kidney disease Father   . Diabetes Father   . Heart failure Mother   . Hypertension Mother   . Diabetes Sister   . Diabetes Brother   . Melanoma Father     History  Substance Use Topics  . Smoking status: Never Smoker   . Smokeless tobacco: Never Used  . Alcohol Use: No      Review of Systems  Constitutional: Negative for activity change, appetite change and fatigue.  HENT: Negative for neck stiffness.   Eyes: Negative for pain.  Respiratory: Negative for chest tightness and shortness of breath.   Cardiovascular: Negative for chest pain and leg swelling.  Gastrointestinal: Positive for nausea, vomiting, abdominal pain and diarrhea.  Genitourinary: Negative for flank pain.  Musculoskeletal: Negative for back pain.  Skin: Negative for rash.  Neurological: Positive for light-headedness. Negative for weakness, numbness and headaches.  Psychiatric/Behavioral: Negative for behavioral problems.    Allergies  Review of patient's allergies indicates no known allergies.  Home Medications   Current Outpatient Rx  Name  Route  Sig  Dispense  Refill  . ASPIRIN 81 MG PO CHEW   Oral   Chew 1 tablet (81 mg total) by mouth daily.         . ATORVASTATIN CALCIUM 80 MG PO TABS   Oral   Take 1 tablet (80 mg total) by mouth daily.   30 tablet  11   . INSULIN GLARGINE 100 UNIT/ML [place] SOLN   Subcutaneous   Inject 15-20 Units into the skin 2 (two) times daily. Take 15 units in the morning Take 20 units at night         . LISINOPRIL 5 MG PO TABS   Oral   Take 1 tablet (5 mg total) by mouth daily.   30 tablet   11   . METOPROLOL TARTRATE 50 MG PO TABS   Oral   Take 1 tablet (50 mg total) by mouth 2 (two) times daily.   60 tablet   11   . PRASUGREL HCL 10 MG PO TABS   Oral   Take 1 tablet (10 mg total) by mouth daily.   30 tablet   0   . NITROGLYCERIN  0.4 MG SL SUBL   Sublingual   Place 1 tablet (0.4 mg total) under the tongue every 5 (five) minutes as needed for chest pain (while SBP > 110 mmHg).   25 tablet   3   . ONDANSETRON 8 MG PO TBDP   Oral   Take 1 tablet (8 mg total) by mouth every 8 (eight) hours as needed for nausea.   20 tablet   0     BP 127/68  Pulse 67  Temp 97.8 F (36.6 C) (Oral)  Resp 12  Ht 5' 10"  (1.778 m)  Wt 210 lb (95.255 kg)  BMI 30.13 kg/m2  SpO2 95%  Physical Exam  Nursing note and vitals reviewed. Constitutional: He is oriented to person, place, and time. He appears well-developed and well-nourished.  HENT:  Head: Normocephalic and atraumatic.  Neck: Normal range of motion. Neck supple.  Cardiovascular: Normal rate, regular rhythm and normal heart sounds.   No murmur heard. Pulmonary/Chest: Effort normal and breath sounds normal.  Abdominal: Soft. Bowel sounds are normal. He exhibits no distension and no mass. There is no tenderness. There is no rebound and no guarding.  Musculoskeletal: Normal range of motion. He exhibits no edema.  Neurological: He is alert and oriented to person, place, and time. No cranial nerve deficit.  Skin: Skin is warm and dry.  Psychiatric: He has a normal mood and affect.    ED Course  Procedures (including critical care time)  Labs Reviewed  CBC WITH DIFFERENTIAL - Abnormal; Notable for the following:    HCT 38.7 (*)     All other components within normal limits  COMPREHENSIVE METABOLIC PANEL - Abnormal; Notable for the following:    Potassium 3.3 (*)     Glucose, Bld 178 (*)     All other components within normal limits   Dg Abd Acute W/chest  07/10/2012  *RADIOLOGY REPORT*  Clinical Data: Vomiting, diarrhea, left upper quadrant pain.  ACUTE ABDOMEN SERIES (ABDOMEN 2 VIEW & CHEST 1 VIEW)  Comparison: Chest 07/17/2011.  Findings: Shallow inspiration.  Normal heart size and pulmonary vascularity.  No focal airspace consolidation.  No blunting of  costophrenic angles.  No pneumothorax.  No significant change since previous study.  Scattered gas and stool throughout the colon.  No small or large bowel distension.  No free intra-abdominal air.  No abnormal air fluid levels.  No radiopaque stones.  Vascular calcifications. Calcification of the vas deferens.  Mild degenerative changes in the spine.  IMPRESSION: No evidence of active pulmonary disease.  Nonobstructive bowel gas pattern.   Original Report Authenticated By: Lucienne Capers, M.D.      1. Diarrhea   2. Nausea and  vomiting       MDM  Patient with nausea vomiting recently and diarrhea for longer time. The diarrhea started after being started on metformin. Laboratories overall reassuring. Glucose minimally elevated. Metformin will be stopped he'll contact his PCP about a replacement. He'll be discharged home. He is tolerated orals here.        Jasper Riling. Alvino Chapel, MD 07/10/12 [id number]

## 2012-07-19 ENCOUNTER — Encounter (HOSPITAL_COMMUNITY): Payer: Self-pay | Admitting: *Deleted

## 2012-07-19 ENCOUNTER — Emergency Department (HOSPITAL_COMMUNITY)
Admission: EM | Admit: 2012-07-19 | Discharge: 2012-07-20 | Disposition: A | Discharge status: Home / Self Care | Payer: BC Managed Care – PPO | Attending: Emergency Medicine | Admitting: Emergency Medicine

## 2012-07-19 DIAGNOSIS — H11439 Conjunctival hyperemia, unspecified eye: Secondary | ICD-10-CM

## 2012-07-19 DIAGNOSIS — Z79899 Other long term (current) drug therapy: Secondary | ICD-10-CM | POA: Insufficient documentation

## 2012-07-19 DIAGNOSIS — H5710 Ocular pain, unspecified eye: Secondary | ICD-10-CM

## 2012-07-19 DIAGNOSIS — H40059 Ocular hypertension, unspecified eye: Secondary | ICD-10-CM

## 2012-07-19 DIAGNOSIS — Z9889 Other specified postprocedural states: Secondary | ICD-10-CM | POA: Insufficient documentation

## 2012-07-19 DIAGNOSIS — H538 Other visual disturbances: Secondary | ICD-10-CM | POA: Insufficient documentation

## 2012-07-19 DIAGNOSIS — E119 Type 2 diabetes mellitus without complications: Secondary | ICD-10-CM | POA: Insufficient documentation

## 2012-07-19 DIAGNOSIS — Z7982 Long term (current) use of aspirin: Secondary | ICD-10-CM | POA: Insufficient documentation

## 2012-07-19 DIAGNOSIS — I10 Essential (primary) hypertension: Secondary | ICD-10-CM | POA: Insufficient documentation

## 2012-07-19 DIAGNOSIS — J3489 Other specified disorders of nose and nasal sinuses: Secondary | ICD-10-CM | POA: Insufficient documentation

## 2012-07-19 DIAGNOSIS — H5789 Other specified disorders of eye and adnexa: Secondary | ICD-10-CM | POA: Insufficient documentation

## 2012-07-19 DIAGNOSIS — H53149 Visual discomfort, unspecified: Secondary | ICD-10-CM | POA: Insufficient documentation

## 2012-07-19 DIAGNOSIS — H40019 Open angle with borderline findings, low risk, unspecified eye: Secondary | ICD-10-CM | POA: Insufficient documentation

## 2012-07-19 DIAGNOSIS — I2510 Atherosclerotic heart disease of native coronary artery without angina pectoris: Secondary | ICD-10-CM | POA: Insufficient documentation

## 2012-07-19 DIAGNOSIS — E7800 Pure hypercholesterolemia, unspecified: Secondary | ICD-10-CM | POA: Insufficient documentation

## 2012-07-19 DIAGNOSIS — Z794 Long term (current) use of insulin: Secondary | ICD-10-CM | POA: Insufficient documentation

## 2012-07-19 NOTE — ED Notes (Signed)
Pt c/o left eye irritation x 3-4 days; redness/pain

## 2012-07-20 LAB — GLUCOSE, CAPILLARY: Glucose-Capillary: 197 mg/dL — ABNORMAL HIGH (ref 70–99)

## 2012-07-20 MED ORDER — PILOCARPINE HCL 2 % OP SOLN
1.0000 [drp] | Freq: Four times a day (QID) | OPHTHALMIC | Status: DC
  Administered 2012-07-20 (×2): 1 [drp] via OPHTHALMIC
  Filled 2012-07-20: qty 15

## 2012-07-20 MED ORDER — KETOROLAC TROMETHAMINE 0.5 % OP SOLN
1.0000 [drp] | Freq: Four times a day (QID) | OPHTHALMIC | Status: DC
  Administered 2012-07-20 (×2): 1 [drp] via OPHTHALMIC
  Filled 2012-07-20: qty 3

## 2012-07-20 MED ORDER — TETRACAINE HCL 0.5 % OP SOLN
OPHTHALMIC | Status: AC
  Administered 2012-07-20: 01:00:00
  Filled 2012-07-20: qty 2

## 2012-07-20 MED ORDER — PILOCARPINE HCL 2 % OP SOLN: 1.0000 [drp] | Freq: Four times a day (QID) | OPHTHALMIC | Status: DC

## 2012-07-20 MED ORDER — TIMOLOL MALEATE 0.5 % OP SOLN
1.0000 [drp] | Freq: Two times a day (BID) | OPHTHALMIC | Status: DC
  Administered 2012-07-20 (×2): 1 [drp] via OPHTHALMIC
  Filled 2012-07-20: qty 5

## 2012-07-20 MED ORDER — TIMOLOL MALEATE 0.5 % OP SOLN: 1.0000 [drp] | Freq: Two times a day (BID) | OPHTHALMIC | Status: DC

## 2012-07-20 MED ORDER — ACETAMINOPHEN 325 MG PO TABS
975.0000 mg | ORAL_TABLET | Freq: Once | ORAL | Status: AC
  Administered 2012-07-20: 975 mg via ORAL
  Filled 2012-07-20: qty 3

## 2012-07-20 MED ORDER — KETOROLAC TROMETHAMINE 0.5 % OP SOLN: 1.0000 [drp] | Freq: Four times a day (QID) | OPHTHALMIC | Status: DC

## 2012-07-20 NOTE — ED Provider Notes (Signed)
History     CSN: [id number]  Arrival date & time 07/19/12  2216   First MD Initiated Contact with Patient 07/20/12 0022      Chief Complaint  Patient presents with  . eye irritation     (Consider location/radiation/quality/duration/timing/severity/associated sxs/prior treatment) The history is provided by the patient and medical records.    Derek Lowery is a 56 y.o. male  with a hx of diabetes, HTN, hypercholesteremia  presents to the Emergency Department complaining of gradual, persistent, progressively worsening eye irritation and pain onset 4 days ago. Associated symptoms include itching, burning, watering, pressure behind the eye, sinus pressure on the L, rhinorrhea on L, blurry vision in the affected eye.  Nothing makes it better and nothing makes it worse.  Pt denies fever, chills, headache, chest pain, abdominal pain, nausea, vomiting, diarrhea, weakness, dizziess.  Patient also denies matting of the eye, purulent drainage. Took benadryl tonight and that helps with the itching but not the other symptoms.  Pt has not check blood sugar lately.   Past Medical History  Diagnosis Date  . Hypercholesteremia   . Type 2 diabetes mellitus 1990  . Internal hemorrhoids   . Essential hypertension, benign   . Noncompliance   . CAD (coronary artery disease)     NSTEMI 06/2011:  LHC 07/18/11: Proximal diagonal 60%, distal LAD with a diabetic appearance and 60% stenosis, OM2 with an occluded superior branch and an inferior branch with 90%, EF 55% with inferior hypokinesis.  PCI: Promus DES to the OM2 inferior branch.  This vessel provides collaterals to the superior branch which remained occluded.  Echocardiogram 07/18/11: EF 60%, normal wall motion.    Past Surgical History  Procedure Date  . Shoulder surgery   . Colonoscopy 2000    Dr. Collene Mares    Family History  Problem Relation Age of Onset  . Coronary artery disease Father     Developed in his 32s  . Kidney disease Father   .  Diabetes Father   . Heart failure Mother   . Hypertension Mother   . Diabetes Sister   . Diabetes Brother   . Melanoma Father     History  Substance Use Topics  . Smoking status: Never Smoker   . Smokeless tobacco: Never Used  . Alcohol Use: No      Review of Systems  Constitutional: Negative for fever, diaphoresis, appetite change, fatigue and unexpected weight change.  HENT: Positive for congestion, rhinorrhea and sinus pressure. Negative for mouth sores and neck stiffness.   Eyes: Positive for photophobia, pain, redness, itching and visual disturbance. Negative for discharge.  Respiratory: Negative for cough, chest tightness, shortness of breath and wheezing.   Cardiovascular: Negative for chest pain.  Gastrointestinal: Negative for nausea, vomiting, abdominal pain, diarrhea and constipation.  Genitourinary: Negative for dysuria, urgency, frequency and hematuria.  Skin: Negative for rash.  Neurological: Negative for syncope, light-headedness and headaches.  Psychiatric/Behavioral: Negative for sleep disturbance. The patient is not nervous/anxious.   All other systems reviewed and are negative.    Allergies  Review of patient's allergies indicates no known allergies.  Home Medications   Current Outpatient Rx  Name  Route  Sig  Dispense  Refill  . ASPIRIN 81 MG PO CHEW   Oral   Chew 1 tablet (81 mg total) by mouth daily.         . ATORVASTATIN CALCIUM 80 MG PO TABS   Oral   Take 1 tablet (80 mg total) by  mouth daily.   30 tablet   11   . DIPHENHYDRAMINE HCL 25 MG PO TABS   Oral   Take 25 mg by mouth every 4 (four) hours as needed. For eye swelling.         . INSULIN GLARGINE 100 UNIT/ML [place] SOLN   Subcutaneous   Inject 15-20 Units into the skin 2 (two) times daily. Take 15 units in the morning Take 20 units at night         . LISINOPRIL 5 MG PO TABS   Oral   Take 1 tablet (5 mg total) by mouth daily.   30 tablet   11   . METOPROLOL TARTRATE 50 MG  PO TABS   Oral   Take 1 tablet (50 mg total) by mouth 2 (two) times daily.   60 tablet   11   . ONDANSETRON 8 MG PO TBDP   Oral   Take 1 tablet (8 mg total) by mouth every 8 (eight) hours as needed for nausea.   20 tablet   0   . PRASUGREL HCL 10 MG PO TABS   Oral   Take 1 tablet (10 mg total) by mouth daily.   30 tablet   0   . KETOROLAC TROMETHAMINE 0.5 % OP SOLN   Left Eye   Place 1 drop into the left eye 4 (four) times daily.   5 mL   0   . NITROGLYCERIN 0.4 MG SL SUBL   Sublingual   Place 1 tablet (0.4 mg total) under the tongue every 5 (five) minutes as needed for chest pain (while SBP > 110 mmHg).   25 tablet   3   . PILOCARPINE HCL 2 % OP SOLN   Left Eye   Place 1 drop into the left eye 4 (four) times daily.   15 mL   0   . TIMOLOL MALEATE 0.5 % OP SOLN   Left Eye   Place 1 drop into the left eye 2 (two) times daily.   10 mL   0     BP 147/85  Pulse 76  Temp 97.9 F (36.6 C) (Oral)  Resp 18  SpO2 97%  Physical Exam  Nursing note and vitals reviewed. Constitutional: He appears well-developed and well-nourished. No distress.  HENT:  Head: Normocephalic and atraumatic.  Mouth/Throat: Oropharynx is clear and moist. No oropharyngeal exudate.  Eyes: EOM and lids are normal. Pupils are equal, round, and reactive to light. No foreign bodies found. Right eye exhibits no discharge and no exudate. No foreign body present in the right eye. Left eye exhibits no discharge and no exudate. No foreign body present in the left eye. Right conjunctiva is not injected. Right conjunctiva has no hemorrhage. Left conjunctiva is injected. Left conjunctiva has no hemorrhage. No scleral icterus. Left eye exhibits normal extraocular motion and no nystagmus. Left pupil is round and reactive. Pupils are equal.  Fundoscopic exam:      The right eye shows no AV nicking, no exudate and no hemorrhage. The right eye shows red reflex.      The left eye shows no AV nicking, no exudate  and no hemorrhage. The left eye shows red reflex. Slit lamp exam:      The right eye shows no corneal abrasion, no corneal ulcer and no fluorescein uptake.       The left eye shows no corneal abrasion, no corneal ulcer and no fluorescein uptake.  TONO in L eye 36 TONO in R eye not obtainable  Visual Acuity: R 20/30 L 20/400   Neck: Normal range of motion. Neck supple.  Cardiovascular: Normal rate, regular rhythm and intact distal pulses.   Pulmonary/Chest: Effort normal and breath sounds normal. No respiratory distress. He has no wheezes.  Musculoskeletal: Normal range of motion.  Neurological: He is alert. Coordination normal.       Speech is clear and goal oriented Moves extremities without ataxia  Skin: Skin is warm and dry. No rash noted. He is not diaphoretic. No erythema.  Psychiatric: He has a normal mood and affect.    ED Course  Procedures (including critical care time)  Labs Reviewed  GLUCOSE, CAPILLARY - Abnormal; Notable for the following:    Glucose-Capillary 197 (*)     All other components within normal limits   No results found.   1. Elevated IOP   2. Eye pain   3. Conjunctival injection       MDM  Derek Lowery with left eye pain and irritation for 4 days.  Concern for iritis versus open angle glaucoma.  Also of concern is the patient's anticoagulation use and the possibility of a bleed behind the left eye;  patient denies trauma to the left eye. Elevated TONO in the left eye along with decreased visual acuity.  I spoke with Dr. Lucita Ferrara about the patient. He's recommending a regimen of 3 drops given 20 minutes apart in conjunction with Tylenol for headache and then recheck the TONO in approximately one hour.  Patient is to follow up with Dr. Lucita Ferrara at noon today.    Dr. Leonard Schwartz was consulted, evaluated this patient with me and agrees with the plan.  He will continue to follow the patient and recheck the TONO in approximately one hour.   Plan: Discharge home if TONO has decreased.    1. Medications: usual home medications, use eye drops as prescribed 2. Treatment: rest, drink plenty of fluids,  3. Follow Up: FOLLOW UP WITH DR Lucita Ferrara TODAY AT 12:00 (noon) AT [address] [place]          [place], [place] 07/20/12 [phone]

## 2012-07-20 NOTE — ED Provider Notes (Signed)
Medical screening examination/treatment/procedure(s) were conducted as a shared visit with non-physician practitioner(s) and myself.  I personally evaluated the patient during the encounter    Repeat intraocular pressure measured 25 with a 95% confidence level  Dot Lanes, MD 07/20/12 [phone]

## 2012-07-23 ENCOUNTER — Encounter (INDEPENDENT_AMBULATORY_CARE_PROVIDER_SITE_OTHER): Payer: BC Managed Care – PPO | Admitting: Ophthalmology

## 2012-07-23 ENCOUNTER — Other Ambulatory Visit (HOSPITAL_COMMUNITY): Payer: Self-pay | Admitting: Physician Assistant

## 2012-07-23 DIAGNOSIS — I10 Essential (primary) hypertension: Secondary | ICD-10-CM

## 2012-07-23 DIAGNOSIS — H211X9 Other vascular disorders of iris and ciliary body, unspecified eye: Secondary | ICD-10-CM

## 2012-07-23 DIAGNOSIS — E1165 Type 2 diabetes mellitus with hyperglycemia: Secondary | ICD-10-CM

## 2012-07-23 DIAGNOSIS — H43819 Vitreous degeneration, unspecified eye: Secondary | ICD-10-CM

## 2012-07-23 DIAGNOSIS — H2510 Age-related nuclear cataract, unspecified eye: Secondary | ICD-10-CM

## 2012-07-23 DIAGNOSIS — E11359 Type 2 diabetes mellitus with proliferative diabetic retinopathy without macular edema: Secondary | ICD-10-CM

## 2012-07-23 DIAGNOSIS — E11319 Type 2 diabetes mellitus with unspecified diabetic retinopathy without macular edema: Secondary | ICD-10-CM

## 2012-07-23 DIAGNOSIS — H35039 Hypertensive retinopathy, unspecified eye: Secondary | ICD-10-CM

## 2012-07-23 DIAGNOSIS — E1139 Type 2 diabetes mellitus with other diabetic ophthalmic complication: Secondary | ICD-10-CM

## 2012-07-23 DIAGNOSIS — H1820 Unspecified corneal edema: Secondary | ICD-10-CM

## 2012-08-04 ENCOUNTER — Telehealth: Payer: Self-pay | Admitting: Cardiology

## 2012-08-04 NOTE — Telephone Encounter (Signed)
Will forward to Theodosia Quay, RN.

## 2012-08-04 NOTE — Telephone Encounter (Signed)
plz return call to pt regarding disability documents that are holding up the judges decision on this case.  Please return call to update the status of this case. Pt sister [place] [phone] can be reached for additional information.

## 2012-08-05 NOTE — Telephone Encounter (Signed)
F/u   Pt sister called back, regarding paperwork. I informed her that pt would need schedule f/u.

## 2012-08-05 NOTE — Telephone Encounter (Signed)
Left message to call back. I do not have any paperwork on this pt in regards to disability. The pt no showed for appointment with Dr Lia Foyer in September.  The pt needs to schedule follow-up.

## 2012-08-05 NOTE — Telephone Encounter (Signed)
I spoke with Derek Lowery and the pt does have an appointment scheduled with Dr Lia Foyer in January 2014.  She states the pt had a disability hearing in September and the judge requested more records from our office.  To my knowledge I have not seen a request for information.  She will contact the person handling disability claim and make them aware they need to send a release of information to our office.

## 2012-08-09 ENCOUNTER — Other Ambulatory Visit: Payer: Self-pay

## 2012-08-09 MED ORDER — ATORVASTATIN CALCIUM 80 MG PO TABS: 80.0000 mg | ORAL_TABLET | Freq: Every day | ORAL | Status: DC

## 2012-08-27 ENCOUNTER — Ambulatory Visit: Payer: BC Managed Care – PPO | Admitting: Cardiology

## 2012-11-22 ENCOUNTER — Ambulatory Visit (INDEPENDENT_AMBULATORY_CARE_PROVIDER_SITE_OTHER): Payer: BC Managed Care – PPO | Admitting: Ophthalmology

## 2014-01-06 ENCOUNTER — Other Ambulatory Visit (HOSPITAL_COMMUNITY): Payer: Self-pay

## 2014-05-04 ENCOUNTER — Emergency Department (HOSPITAL_COMMUNITY): Payer: Medicaid Other

## 2014-05-04 ENCOUNTER — Inpatient Hospital Stay (HOSPITAL_COMMUNITY)
Admission: EM | Admit: 2014-05-04 | Discharge: 2014-05-10 | DRG: 717 | Disposition: A | Discharge status: Home Health | Payer: Medicaid Other | Attending: Internal Medicine | Admitting: Internal Medicine

## 2014-05-04 ENCOUNTER — Encounter (HOSPITAL_COMMUNITY): Payer: Self-pay | Admitting: Emergency Medicine

## 2014-05-04 DIAGNOSIS — N501 Vascular disorders of male genital organs: Principal | ICD-10-CM | POA: Diagnosis present

## 2014-05-04 DIAGNOSIS — L03317 Cellulitis of buttock: Secondary | ICD-10-CM | POA: Diagnosis present

## 2014-05-04 DIAGNOSIS — I2510 Atherosclerotic heart disease of native coronary artery without angina pectoris: Secondary | ICD-10-CM | POA: Diagnosis present

## 2014-05-04 DIAGNOSIS — Z794 Long term (current) use of insulin: Secondary | ICD-10-CM | POA: Diagnosis present

## 2014-05-04 DIAGNOSIS — E118 Type 2 diabetes mellitus with unspecified complications: Secondary | ICD-10-CM | POA: Diagnosis present

## 2014-05-04 DIAGNOSIS — A419 Sepsis, unspecified organism: Secondary | ICD-10-CM | POA: Diagnosis present

## 2014-05-04 DIAGNOSIS — H409 Unspecified glaucoma: Secondary | ICD-10-CM | POA: Diagnosis present

## 2014-05-04 DIAGNOSIS — I498 Other specified cardiac arrhythmias: Secondary | ICD-10-CM | POA: Diagnosis present

## 2014-05-04 DIAGNOSIS — E1310 Other specified diabetes mellitus with ketoacidosis without coma: Secondary | ICD-10-CM | POA: Diagnosis present

## 2014-05-04 DIAGNOSIS — Z833 Family history of diabetes mellitus: Secondary | ICD-10-CM

## 2014-05-04 DIAGNOSIS — Z7982 Long term (current) use of aspirin: Secondary | ICD-10-CM

## 2014-05-04 DIAGNOSIS — Z9119 Patient's noncompliance with other medical treatment and regimen: Secondary | ICD-10-CM

## 2014-05-04 DIAGNOSIS — I10 Essential (primary) hypertension: Secondary | ICD-10-CM | POA: Diagnosis present

## 2014-05-04 DIAGNOSIS — M726 Necrotizing fasciitis: Secondary | ICD-10-CM

## 2014-05-04 DIAGNOSIS — L0231 Cutaneous abscess of buttock: Secondary | ICD-10-CM | POA: Diagnosis present

## 2014-05-04 DIAGNOSIS — Z8249 Family history of ischemic heart disease and other diseases of the circulatory system: Secondary | ICD-10-CM

## 2014-05-04 DIAGNOSIS — R000 Tachycardia, unspecified: Secondary | ICD-10-CM | POA: Diagnosis present

## 2014-05-04 DIAGNOSIS — E876 Hypokalemia: Secondary | ICD-10-CM | POA: Diagnosis present

## 2014-05-04 DIAGNOSIS — E1165 Type 2 diabetes mellitus with hyperglycemia: Secondary | ICD-10-CM

## 2014-05-04 DIAGNOSIS — E7800 Pure hypercholesterolemia, unspecified: Secondary | ICD-10-CM | POA: Diagnosis present

## 2014-05-04 DIAGNOSIS — E785 Hyperlipidemia, unspecified: Secondary | ICD-10-CM

## 2014-05-04 DIAGNOSIS — E1110 Type 2 diabetes mellitus with ketoacidosis without coma: Secondary | ICD-10-CM | POA: Diagnosis present

## 2014-05-04 DIAGNOSIS — Z91199 Patient's noncompliance with other medical treatment and regimen due to unspecified reason: Secondary | ICD-10-CM

## 2014-05-04 DIAGNOSIS — E1169 Type 2 diabetes mellitus with other specified complication: Secondary | ICD-10-CM | POA: Diagnosis present

## 2014-05-04 DIAGNOSIS — N493 Fournier gangrene: Secondary | ICD-10-CM | POA: Diagnosis present

## 2014-05-04 DIAGNOSIS — Z808 Family history of malignant neoplasm of other organs or systems: Secondary | ICD-10-CM

## 2014-05-04 DIAGNOSIS — R6520 Severe sepsis without septic shock: Secondary | ICD-10-CM

## 2014-05-04 DIAGNOSIS — I252 Old myocardial infarction: Secondary | ICD-10-CM

## 2014-05-04 DIAGNOSIS — R339 Retention of urine, unspecified: Secondary | ICD-10-CM | POA: Diagnosis not present

## 2014-05-04 HISTORY — DX: Unspecified glaucoma: H40.9

## 2014-05-04 MED ORDER — MORPHINE SULFATE 4 MG/ML IJ SOLN
4.0000 mg | Freq: Once | INTRAMUSCULAR | Status: AC
Start: 2014-05-04 — End: 2014-05-04
  Administered 2014-05-04: 4 mg via INTRAVENOUS
  Filled 2014-05-04: qty 1

## 2014-05-04 MED ORDER — IOHEXOL 300 MG/ML  SOLN: 100.0000 mL | Freq: Once | INTRAMUSCULAR | Status: AC | PRN

## 2014-05-04 MED ORDER — VANCOMYCIN HCL IN DEXTROSE 1-5 GM/200ML-% IV SOLN
1000.0000 mg | Freq: Once | INTRAVENOUS | Status: AC
  Administered 2014-05-05: 1000 mg via INTRAVENOUS
  Filled 2014-05-04: qty 200

## 2014-05-04 MED ORDER — SODIUM CHLORIDE 0.9 % IV BOLUS (SEPSIS)
1000.0000 mL | Freq: Once | INTRAVENOUS | Status: AC
  Administered 2014-05-04: 1000 mL via INTRAVENOUS

## 2014-05-04 NOTE — ED Notes (Signed)
Pt states he has an abscess on his left buttock that has been bothering him for a week  Pt states he thought it was a hemorrhoid but now thinks it is an abscess  Pt states it started in the crack of his butt and has spread to the left buttock  Left buttock is red and firm

## 2014-05-05 ENCOUNTER — Other Ambulatory Visit: Payer: Self-pay

## 2014-05-05 ENCOUNTER — Encounter (HOSPITAL_COMMUNITY): Payer: Self-pay

## 2014-05-05 ENCOUNTER — Encounter (HOSPITAL_COMMUNITY)
Admission: EM | Disposition: A | Discharge status: Home Health | Payer: Self-pay | Source: Home / Self Care | Attending: Internal Medicine

## 2014-05-05 ENCOUNTER — Emergency Department (HOSPITAL_COMMUNITY): Payer: Medicaid Other

## 2014-05-05 ENCOUNTER — Inpatient Hospital Stay (HOSPITAL_COMMUNITY): Payer: Medicaid Other | Admitting: Anesthesiology

## 2014-05-05 ENCOUNTER — Encounter (HOSPITAL_COMMUNITY): Payer: Medicaid Other | Admitting: Anesthesiology

## 2014-05-05 ENCOUNTER — Inpatient Hospital Stay: Admit: 2014-05-05 | Payer: Self-pay | Admitting: Surgery

## 2014-05-05 DIAGNOSIS — L0231 Cutaneous abscess of buttock: Secondary | ICD-10-CM | POA: Diagnosis present

## 2014-05-05 DIAGNOSIS — I252 Old myocardial infarction: Secondary | ICD-10-CM | POA: Diagnosis not present

## 2014-05-05 DIAGNOSIS — A419 Sepsis, unspecified organism: Secondary | ICD-10-CM

## 2014-05-05 DIAGNOSIS — N501 Vascular disorders of male genital organs: Principal | ICD-10-CM

## 2014-05-05 DIAGNOSIS — N493 Fournier gangrene: Secondary | ICD-10-CM | POA: Diagnosis present

## 2014-05-05 DIAGNOSIS — Z7982 Long term (current) use of aspirin: Secondary | ICD-10-CM | POA: Diagnosis not present

## 2014-05-05 DIAGNOSIS — Z91199 Patient's noncompliance with other medical treatment and regimen due to unspecified reason: Secondary | ICD-10-CM | POA: Diagnosis not present

## 2014-05-05 DIAGNOSIS — I2510 Atherosclerotic heart disease of native coronary artery without angina pectoris: Secondary | ICD-10-CM | POA: Diagnosis present

## 2014-05-05 DIAGNOSIS — H409 Unspecified glaucoma: Secondary | ICD-10-CM | POA: Diagnosis present

## 2014-05-05 DIAGNOSIS — R6520 Severe sepsis without septic shock: Secondary | ICD-10-CM

## 2014-05-05 DIAGNOSIS — E1310 Other specified diabetes mellitus with ketoacidosis without coma: Secondary | ICD-10-CM | POA: Diagnosis not present

## 2014-05-05 DIAGNOSIS — R339 Retention of urine, unspecified: Secondary | ICD-10-CM | POA: Diagnosis not present

## 2014-05-05 DIAGNOSIS — Z9119 Patient's noncompliance with other medical treatment and regimen: Secondary | ICD-10-CM

## 2014-05-05 DIAGNOSIS — Z8249 Family history of ischemic heart disease and other diseases of the circulatory system: Secondary | ICD-10-CM | POA: Diagnosis not present

## 2014-05-05 DIAGNOSIS — E785 Hyperlipidemia, unspecified: Secondary | ICD-10-CM | POA: Diagnosis present

## 2014-05-05 DIAGNOSIS — Z808 Family history of malignant neoplasm of other organs or systems: Secondary | ICD-10-CM | POA: Diagnosis not present

## 2014-05-05 DIAGNOSIS — M726 Necrotizing fasciitis: Secondary | ICD-10-CM

## 2014-05-05 DIAGNOSIS — E876 Hypokalemia: Secondary | ICD-10-CM | POA: Diagnosis present

## 2014-05-05 DIAGNOSIS — Z833 Family history of diabetes mellitus: Secondary | ICD-10-CM | POA: Diagnosis not present

## 2014-05-05 DIAGNOSIS — E7800 Pure hypercholesterolemia, unspecified: Secondary | ICD-10-CM | POA: Diagnosis present

## 2014-05-05 DIAGNOSIS — L03317 Cellulitis of buttock: Secondary | ICD-10-CM | POA: Diagnosis present

## 2014-05-05 DIAGNOSIS — I10 Essential (primary) hypertension: Secondary | ICD-10-CM | POA: Diagnosis present

## 2014-05-05 DIAGNOSIS — I498 Other specified cardiac arrhythmias: Secondary | ICD-10-CM | POA: Diagnosis present

## 2014-05-05 HISTORY — PX: IRRIGATION AND DEBRIDEMENT ABSCESS: SHX5252

## 2014-05-05 LAB — BASIC METABOLIC PANEL
ANION GAP: 25 — AB (ref 5–15)
ANION GAP: 16 — AB (ref 5–15)
Anion gap: 16 — ABNORMAL HIGH (ref 5–15)
Anion gap: 13 (ref 5–15)
Anion gap: 14 (ref 5–15)
Anion gap: 14 (ref 5–15)
BUN: 18 mg/dL (ref 6–23)
BUN: 13 mg/dL (ref 6–23)
BUN: 11 mg/dL (ref 6–23)
BUN: 10 mg/dL (ref 6–23)
BUN: 8 mg/dL (ref 6–23)
BUN: 7 mg/dL (ref 6–23)
CALCIUM: 8.1 mg/dL — AB (ref 8.4–10.5)
CHLORIDE: 103 meq/L (ref 96–112)
CHLORIDE: 104 meq/L (ref 96–112)
CO2: 10 mEq/L — CL (ref 19–32)
CO2: 16 meq/L — AB (ref 19–32)
CO2: 16 meq/L — AB (ref 19–32)
CO2: 19 mEq/L (ref 19–32)
CO2: 18 mEq/L — ABNORMAL LOW (ref 19–32)
CO2: 19 mEq/L (ref 19–32)
CREATININE: 0.52 mg/dL (ref 0.50–1.35)
CREATININE: 0.51 mg/dL (ref 0.50–1.35)
CREATININE: 0.52 mg/dL (ref 0.50–1.35)
Calcium: 9.6 mg/dL (ref 8.4–10.5)
Calcium: 8.1 mg/dL — ABNORMAL LOW (ref 8.4–10.5)
Calcium: 8.2 mg/dL — ABNORMAL LOW (ref 8.4–10.5)
Calcium: 8 mg/dL — ABNORMAL LOW (ref 8.4–10.5)
Calcium: 8.2 mg/dL — ABNORMAL LOW (ref 8.4–10.5)
Chloride: 95 mEq/L — ABNORMAL LOW (ref 96–112)
Chloride: 103 mEq/L (ref 96–112)
Chloride: 100 mEq/L (ref 96–112)
Chloride: 101 mEq/L (ref 96–112)
Creatinine, Ser: 0.62 mg/dL (ref 0.50–1.35)
Creatinine, Ser: 0.5 mg/dL (ref 0.50–1.35)
Creatinine, Ser: 0.52 mg/dL (ref 0.50–1.35)
GFR calc Af Amer: >90 mL/min (ref >90)
GFR calc Af Amer: >90 mL/min (ref >90)
GFR calc Af Amer: >90 mL/min (ref >90)
GFR calc non Af Amer: >90 mL/min (ref >90)
GFR calc non Af Amer: >90 mL/min (ref >90)
GFR calc non Af Amer: >90 mL/min (ref >90)
GLUCOSE: 189 mg/dL — AB (ref 70–99)
GLUCOSE: 197 mg/dL — AB (ref 70–99)
Glucose, Bld: 307 mg/dL — ABNORMAL HIGH (ref 70–99)
Glucose, Bld: 169 mg/dL — ABNORMAL HIGH (ref 70–99)
Glucose, Bld: 148 mg/dL — ABNORMAL HIGH (ref 70–99)
Glucose, Bld: 172 mg/dL — ABNORMAL HIGH (ref 70–99)
POTASSIUM: 4.5 meq/L (ref 3.7–5.3)
POTASSIUM: 3.9 meq/L (ref 3.7–5.3)
Potassium: 3.9 mEq/L (ref 3.7–5.3)
Potassium: 3.7 mEq/L (ref 3.7–5.3)
Potassium: 3.5 mEq/L — ABNORMAL LOW (ref 3.7–5.3)
Potassium: 3.4 mEq/L — ABNORMAL LOW (ref 3.7–5.3)
SODIUM: 130 meq/L — AB (ref 137–147)
Sodium: 135 mEq/L — ABNORMAL LOW (ref 137–147)
Sodium: 135 mEq/L — ABNORMAL LOW (ref 137–147)
Sodium: 136 mEq/L — ABNORMAL LOW (ref 137–147)
Sodium: 132 mEq/L — ABNORMAL LOW (ref 137–147)
Sodium: 134 mEq/L — ABNORMAL LOW (ref 137–147)

## 2014-05-05 LAB — CBC
HCT: 37.8 % — ABNORMAL LOW (ref 39.0–52.0)
Hemoglobin: 12.8 g/dL — ABNORMAL LOW (ref 13.0–17.0)
MCH: 27.9 pg (ref 26.0–34.0)
MCHC: 33.9 g/dL (ref 30.0–36.0)
MCV: 82.4 fL (ref 78.0–100.0)
Platelets: 169 10*3/uL (ref 150–400)
RBC: 4.59 MIL/uL (ref 4.22–5.81)
RDW: 12.7 % (ref 11.5–15.5)
WBC: 13.9 10*3/uL — ABNORMAL HIGH (ref 4.0–10.5)

## 2014-05-05 LAB — CBC WITH DIFFERENTIAL/PLATELET
BASOS ABS: 0 10*3/uL (ref 0.0–0.1)
BASOS PCT: 0 % (ref 0–1)
EOS ABS: 0 10*3/uL (ref 0.0–0.7)
Eosinophils Relative: 0 % (ref 0–5)
HCT: 43.3 % (ref 39.0–52.0)
Hemoglobin: 15.1 g/dL (ref 13.0–17.0)
Lymphocytes Relative: 10 % — ABNORMAL LOW (ref 12–46)
Lymphs Abs: 1.4 10*3/uL (ref 0.7–4.0)
MCH: 28.2 pg (ref 26.0–34.0)
MCHC: 34.9 g/dL (ref 30.0–36.0)
MCV: 80.8 fL (ref 78.0–100.0)
MONOS PCT: 6 % (ref 3–12)
Monocytes Absolute: 0.8 10*3/uL (ref 0.1–1.0)
NEUTROS ABS: 11.4 10*3/uL — AB (ref 1.7–7.7)
NEUTROS PCT: 84 % — AB (ref 43–77)
PLATELETS: 175 10*3/uL (ref 150–400)
RBC: 5.36 MIL/uL (ref 4.22–5.81)
RDW: 12.5 % (ref 11.5–15.5)
WBC: 13.6 10*3/uL — ABNORMAL HIGH (ref 4.0–10.5)

## 2014-05-05 LAB — BLOOD GAS, VENOUS
Acid-base deficit: 10.9 mmol/L — ABNORMAL HIGH (ref 0.0–2.0)
Bicarbonate: 14.2 mEq/L — ABNORMAL LOW (ref 20.0–24.0)
FIO2: 0.21 %
O2 SAT: 76.7 %
PATIENT TEMPERATURE: 98.6
TCO2: 12.8 mmol/L (ref 0–100)
pCO2, Ven: 30.6 mmHg — ABNORMAL LOW (ref 45.0–50.0)
pH, Ven: 7.289 (ref 7.250–7.300)

## 2014-05-05 LAB — CBG MONITORING, ED
Glucose-Capillary: 274 mg/dL — ABNORMAL HIGH (ref 70–99)
Glucose-Capillary: 301 mg/dL — ABNORMAL HIGH (ref 70–99)

## 2014-05-05 LAB — GLUCOSE, CAPILLARY
GLUCOSE-CAPILLARY: 104 mg/dL — AB (ref 70–99)
GLUCOSE-CAPILLARY: 106 mg/dL — AB (ref 70–99)
GLUCOSE-CAPILLARY: 118 mg/dL — AB (ref 70–99)
GLUCOSE-CAPILLARY: 169 mg/dL — AB (ref 70–99)
GLUCOSE-CAPILLARY: 165 mg/dL — AB (ref 70–99)
GLUCOSE-CAPILLARY: 192 mg/dL — AB (ref 70–99)
Glucose-Capillary: 236 mg/dL — ABNORMAL HIGH (ref 70–99)
Glucose-Capillary: 155 mg/dL — ABNORMAL HIGH (ref 70–99)
Glucose-Capillary: 173 mg/dL — ABNORMAL HIGH (ref 70–99)
Glucose-Capillary: 175 mg/dL — ABNORMAL HIGH (ref 70–99)
Glucose-Capillary: 154 mg/dL — ABNORMAL HIGH (ref 70–99)
Glucose-Capillary: 191 mg/dL — ABNORMAL HIGH (ref 70–99)
Glucose-Capillary: 153 mg/dL — ABNORMAL HIGH (ref 70–99)
Glucose-Capillary: 184 mg/dL — ABNORMAL HIGH (ref 70–99)
Glucose-Capillary: 167 mg/dL — ABNORMAL HIGH (ref 70–99)

## 2014-05-05 LAB — I-STAT CHEM 8, ED
BUN: 17 mg/dL (ref 6–23)
CALCIUM ION: 1.14 mmol/L (ref 1.12–1.23)
Chloride: 103 mEq/L (ref 96–112)
Creatinine, Ser: 0.6 mg/dL (ref 0.50–1.35)
Glucose, Bld: 285 mg/dL — ABNORMAL HIGH (ref 70–99)
HEMATOCRIT: 47 % (ref 39.0–52.0)
HEMOGLOBIN: 16 g/dL (ref 13.0–17.0)
POTASSIUM: 4.3 meq/L (ref 3.7–5.3)
Sodium: 131 mEq/L — ABNORMAL LOW (ref 137–147)
TCO2: 14 mmol/L (ref 0–100)

## 2014-05-05 LAB — LACTIC ACID, PLASMA: Lactic Acid, Venous: 1.5 mmol/L (ref 0.5–2.2)

## 2014-05-05 LAB — URINALYSIS, ROUTINE W REFLEX MICROSCOPIC
Bilirubin Urine: NEGATIVE
Glucose, UA: >1000 mg/dL — AB
Ketones, ur: 40 mg/dL — AB
Leukocytes, UA: NEGATIVE
Nitrite: NEGATIVE
Protein, ur: NEGATIVE mg/dL
Specific Gravity, Urine: 1.03 (ref 1.005–1.030)
Urobilinogen, UA: 1 mg/dL (ref 0.0–1.0)
pH: 5 (ref 5.0–8.0)

## 2014-05-05 LAB — URINE MICROSCOPIC-ADD ON

## 2014-05-05 LAB — DIC (DISSEMINATED INTRAVASCULAR COAGULATION) PANEL
PLATELETS: 169 10*3/uL (ref 150–400)
SMEAR REVIEW: NONE SEEN

## 2014-05-05 LAB — I-STAT CG4 LACTIC ACID, ED: Lactic Acid, Venous: 1.21 mmol/L (ref 0.5–2.2)

## 2014-05-05 LAB — DIC (DISSEMINATED INTRAVASCULAR COAGULATION)PANEL
D-Dimer, Quant: 1.74 ug/mL-FEU — ABNORMAL HIGH (ref 0.00–0.48)
INR: 1.29 (ref 0.00–1.49)
Prothrombin Time: 16.1 seconds — ABNORMAL HIGH (ref 11.6–15.2)
aPTT: 34 seconds (ref 24–37)

## 2014-05-05 LAB — PHOSPHORUS: Phosphorus: 2.2 mg/dL — ABNORMAL LOW (ref 2.3–4.6)

## 2014-05-05 LAB — PROCALCITONIN: Procalcitonin: 0.14 ng/mL

## 2014-05-05 LAB — MRSA PCR SCREENING: MRSA BY PCR: NEGATIVE

## 2014-05-05 LAB — MAGNESIUM: MAGNESIUM: 1.6 mg/dL (ref 1.5–2.5)

## 2014-05-05 SURGERY — IRRIGATION AND DEBRIDEMENT ABSCESS
Anesthesia: General | Site: Buttocks | Laterality: Left

## 2014-05-05 SURGERY — INCISION AND DRAINAGE, ABSCESS
Anesthesia: General | Laterality: Left

## 2014-05-05 MED ORDER — DIPHENHYDRAMINE HCL 50 MG/ML IJ SOLN: 12.5000 mg | Freq: Four times a day (QID) | INTRAMUSCULAR | Status: DC | PRN

## 2014-05-05 MED ORDER — IOHEXOL 300 MG/ML  SOLN
100.0000 mL | Freq: Once | INTRAMUSCULAR | Status: AC | PRN
  Administered 2014-05-05: 100 mL via INTRAVENOUS

## 2014-05-05 MED ORDER — SODIUM CHLORIDE 0.9 % IV SOLN
INTRAVENOUS | Status: DC
  Filled 2014-05-05: qty 2.5

## 2014-05-05 MED ORDER — PHENYLEPHRINE HCL 10 MG/ML IJ SOLN
INTRAMUSCULAR | Status: DC | PRN
  Administered 2014-05-05: 80 ug via INTRAVENOUS

## 2014-05-05 MED ORDER — 0.9 % SODIUM CHLORIDE (POUR BTL) OPTIME
TOPICAL | Status: DC | PRN
  Administered 2014-05-05 (×2): 1000 mL

## 2014-05-05 MED ORDER — MIDAZOLAM HCL 2 MG/2ML IJ SOLN
INTRAMUSCULAR | Status: AC
  Filled 2014-05-05: qty 2

## 2014-05-05 MED ORDER — SODIUM CHLORIDE 0.9 % IV SOLN
INTRAVENOUS | Status: DC
  Administered 2014-05-05: 2.4 [IU]/h via INTRAVENOUS
  Filled 2014-05-05: qty 2.5

## 2014-05-05 MED ORDER — POTASSIUM CHLORIDE 10 MEQ/100ML IV SOLN: 10.0000 meq | INTRAVENOUS | Status: AC

## 2014-05-05 MED ORDER — SODIUM CHLORIDE 0.9 % IV SOLN
INTRAVENOUS | Status: DC
  Administered 2014-05-05: 05:00:00 via INTRAVENOUS

## 2014-05-05 MED ORDER — SUCCINYLCHOLINE CHLORIDE 20 MG/ML IJ SOLN
INTRAMUSCULAR | Status: DC | PRN
  Administered 2014-05-05: 100 mg via INTRAVENOUS

## 2014-05-05 MED ORDER — CLINDAMYCIN PHOSPHATE 900 MG/50ML IV SOLN
900.0000 mg | Freq: Once | INTRAVENOUS | Status: AC
  Administered 2014-05-05: 900 mg via INTRAVENOUS
  Filled 2014-05-05: qty 50

## 2014-05-05 MED ORDER — SODIUM CHLORIDE 0.9 % IV SOLN
INTRAVENOUS | Status: AC
  Administered 2014-05-05: 03:00:00 via INTRAVENOUS

## 2014-05-05 MED ORDER — FENTANYL CITRATE 0.05 MG/ML IJ SOLN
INTRAMUSCULAR | Status: DC | PRN
  Administered 2014-05-05 (×2): 50 ug via INTRAVENOUS

## 2014-05-05 MED ORDER — ASPIRIN 300 MG RE SUPP: 300.0000 mg | RECTAL | Status: DC

## 2014-05-05 MED ORDER — VITAMIN C 500 MG PO TABS
500.0000 mg | ORAL_TABLET | Freq: Two times a day (BID) | ORAL | Status: DC
Start: 2014-05-05 — End: 2014-05-10
  Administered 2014-05-05 – 2014-05-10 (×11): 500 mg via ORAL
  Filled 2014-05-05 (×13): qty 1

## 2014-05-05 MED ORDER — FENTANYL CITRATE 0.05 MG/ML IJ SOLN
INTRAMUSCULAR | Status: AC
  Filled 2014-05-05: qty 2

## 2014-05-05 MED ORDER — BISACODYL 10 MG RE SUPP: 10.0000 mg | Freq: Two times a day (BID) | RECTAL | Status: DC | PRN

## 2014-05-05 MED ORDER — METOPROLOL TARTRATE 1 MG/ML IV SOLN
INTRAVENOUS | Status: AC
  Filled 2014-05-05: qty 5

## 2014-05-05 MED ORDER — SODIUM CHLORIDE 0.9 % IV SOLN: 1000.0000 mL | INTRAVENOUS | Status: DC

## 2014-05-05 MED ORDER — SODIUM CHLORIDE 0.9 % IV SOLN
250.0000 mL | INTRAVENOUS | Status: DC | PRN
  Administered 2014-05-05: 03:00:00 via INTRAVENOUS

## 2014-05-05 MED ORDER — DEXTROSE 50 % IV SOLN: 25.0000 mL | INTRAVENOUS | Status: DC | PRN

## 2014-05-05 MED ORDER — ONDANSETRON 8 MG/NS 50 ML IVPB
8.0000 mg | Freq: Four times a day (QID) | INTRAVENOUS | Status: DC | PRN
  Filled 2014-05-05: qty 8

## 2014-05-05 MED ORDER — LIP MEDEX EX OINT
1.0000 "application " | TOPICAL_OINTMENT | Freq: Two times a day (BID) | CUTANEOUS | Status: DC
  Administered 2014-05-05 – 2014-05-09 (×9): 1 via TOPICAL
  Filled 2014-05-05: qty 7

## 2014-05-05 MED ORDER — PROMETHAZINE HCL 25 MG/ML IJ SOLN: 6.2500 mg | INTRAMUSCULAR | Status: DC | PRN

## 2014-05-05 MED ORDER — ONDANSETRON HCL 4 MG/2ML IJ SOLN
INTRAMUSCULAR | Status: AC
  Filled 2014-05-05: qty 2

## 2014-05-05 MED ORDER — SODIUM CHLORIDE 0.9 % IV SOLN: INTRAVENOUS | Status: DC

## 2014-05-05 MED ORDER — HYDROMORPHONE HCL 1 MG/ML IJ SOLN: 0.2500 mg | INTRAMUSCULAR | Status: DC | PRN

## 2014-05-05 MED ORDER — PIPERACILLIN-TAZOBACTAM 3.375 G IVPB 30 MIN
3.3750 g | Freq: Once | INTRAVENOUS | Status: AC
  Administered 2014-05-05: 3.375 g via INTRAVENOUS
  Filled 2014-05-05 (×2): qty 50

## 2014-05-05 MED ORDER — ONDANSETRON HCL 4 MG/2ML IJ SOLN
4.0000 mg | Freq: Four times a day (QID) | INTRAMUSCULAR | Status: DC | PRN
  Administered 2014-05-08: 4 mg via INTRAVENOUS
  Filled 2014-05-05: qty 2

## 2014-05-05 MED ORDER — ONDANSETRON HCL 4 MG PO TABS: 4.0000 mg | ORAL_TABLET | Freq: Four times a day (QID) | ORAL | Status: DC | PRN

## 2014-05-05 MED ORDER — ACETAMINOPHEN 325 MG PO TABS: 325.0000 mg | ORAL_TABLET | Freq: Four times a day (QID) | ORAL | Status: DC | PRN

## 2014-05-05 MED ORDER — METOPROLOL TARTRATE 1 MG/ML IV SOLN
INTRAVENOUS | Status: DC | PRN
  Administered 2014-05-05: 2.5 mg via INTRAVENOUS

## 2014-05-05 MED ORDER — MIDAZOLAM HCL 5 MG/5ML IJ SOLN
INTRAMUSCULAR | Status: DC | PRN
  Administered 2014-05-05: 2 mg via INTRAVENOUS

## 2014-05-05 MED ORDER — FENTANYL CITRATE 0.05 MG/ML IJ SOLN
25.0000 ug | INTRAMUSCULAR | Status: DC | PRN
  Administered 2014-05-06 – 2014-05-07 (×5): 50 ug via INTRAVENOUS
  Filled 2014-05-05 (×4): qty 2

## 2014-05-05 MED ORDER — MEPERIDINE HCL 25 MG/ML IJ SOLN: 6.2500 mg | INTRAMUSCULAR | Status: DC | PRN

## 2014-05-05 MED ORDER — CHLORHEXIDINE GLUCONATE 4 % EX LIQD
1.0000 "application " | Freq: Once | CUTANEOUS | Status: DC
  Filled 2014-05-05: qty 15

## 2014-05-05 MED ORDER — OXYCODONE HCL 5 MG PO TABS
5.0000 mg | ORAL_TABLET | ORAL | Status: DC | PRN
  Administered 2014-05-05 – 2014-05-06 (×2): 5 mg via ORAL
  Filled 2014-05-05: qty 1

## 2014-05-05 MED ORDER — SODIUM CHLORIDE 0.9 % IV SOLN: 1000.0000 mL | Freq: Once | INTRAVENOUS | Status: AC

## 2014-05-05 MED ORDER — PIPERACILLIN-TAZOBACTAM 3.375 G IVPB
3.3750 g | Freq: Three times a day (TID) | INTRAVENOUS | Status: DC
  Administered 2014-05-05 – 2014-05-09 (×13): 3.375 g via INTRAVENOUS
  Filled 2014-05-05 (×13): qty 50

## 2014-05-05 MED ORDER — ACETAMINOPHEN 650 MG RE SUPP: 650.0000 mg | Freq: Four times a day (QID) | RECTAL | Status: DC | PRN

## 2014-05-05 MED ORDER — VANCOMYCIN HCL IN DEXTROSE 1-5 GM/200ML-% IV SOLN
1000.0000 mg | Freq: Three times a day (TID) | INTRAVENOUS | Status: DC
  Administered 2014-05-05 – 2014-05-09 (×12): 1000 mg via INTRAVENOUS
  Filled 2014-05-05 (×14): qty 200

## 2014-05-05 MED ORDER — ALUM & MAG HYDROXIDE-SIMETH 200-200-20 MG/5ML PO SUSP
30.0000 mL | Freq: Four times a day (QID) | ORAL | Status: DC | PRN
  Administered 2014-05-05 – 2014-05-10 (×5): 30 mL via ORAL
  Filled 2014-05-05 (×5): qty 30

## 2014-05-05 MED ORDER — SODIUM CHLORIDE 0.9 % IV BOLUS (SEPSIS): 1000.0000 mL | Freq: Once | INTRAVENOUS | Status: AC

## 2014-05-05 MED ORDER — HEPARIN SODIUM (PORCINE) 5000 UNIT/ML IJ SOLN
5000.0000 [IU] | Freq: Three times a day (TID) | INTRAMUSCULAR | Status: DC
  Administered 2014-05-05 – 2014-05-10 (×16): 5000 [IU] via SUBCUTANEOUS
  Filled 2014-05-05 (×22): qty 1

## 2014-05-05 MED ORDER — PROPOFOL 10 MG/ML IV BOLUS
INTRAVENOUS | Status: AC
  Filled 2014-05-05: qty 20

## 2014-05-05 MED ORDER — ASPIRIN 81 MG PO CHEW
324.0000 mg | CHEWABLE_TABLET | ORAL | Status: DC
Start: 2014-05-05 — End: 2014-05-05

## 2014-05-05 MED ORDER — LIDOCAINE HCL (CARDIAC) 20 MG/ML IV SOLN
INTRAVENOUS | Status: DC | PRN
  Administered 2014-05-05: 50 mg via INTRAVENOUS

## 2014-05-05 MED ORDER — LIVING WELL WITH DIABETES BOOK
Freq: Once | Status: DC
  Filled 2014-05-05: qty 1

## 2014-05-05 MED ORDER — LIDOCAINE HCL (CARDIAC) 20 MG/ML IV SOLN
INTRAVENOUS | Status: AC
  Filled 2014-05-05: qty 5

## 2014-05-05 MED ORDER — FENTANYL CITRATE 0.05 MG/ML IJ SOLN: 50.0000 ug | INTRAMUSCULAR | Status: DC | PRN

## 2014-05-05 MED ORDER — SODIUM CHLORIDE 0.9 % IV SOLN
1000.0000 mL | Freq: Once | INTRAVENOUS | Status: AC
  Administered 2014-05-05 (×2): via INTRAVENOUS

## 2014-05-05 MED ORDER — OXYCODONE HCL 5 MG PO TABS
5.0000 mg | ORAL_TABLET | Freq: Once | ORAL | Status: AC | PRN
  Filled 2014-05-05: qty 1

## 2014-05-05 MED ORDER — LACTATED RINGERS IV BOLUS (SEPSIS): 1000.0000 mL | Freq: Three times a day (TID) | INTRAVENOUS | Status: AC | PRN

## 2014-05-05 MED ORDER — OXYCODONE HCL 5 MG/5ML PO SOLN: 5.0000 mg | Freq: Once | ORAL | Status: AC | PRN

## 2014-05-05 MED ORDER — DEXTROSE-NACL 5-0.45 % IV SOLN: INTRAVENOUS | Status: DC

## 2014-05-05 MED ORDER — INSULIN ASPART 100 UNIT/ML ~~LOC~~ SOLN: 1.0000 [IU] | SUBCUTANEOUS | Status: DC

## 2014-05-05 MED ORDER — VANCOMYCIN HCL 10 G IV SOLR
1500.0000 mg | Freq: Two times a day (BID) | INTRAVENOUS | Status: DC
  Filled 2014-05-05: qty 1500

## 2014-05-05 MED ORDER — DEXTROSE-NACL 5-0.45 % IV SOLN
INTRAVENOUS | Status: DC
  Administered 2014-05-05 – 2014-05-07 (×4): via INTRAVENOUS

## 2014-05-05 MED ORDER — PROPOFOL 10 MG/ML IV BOLUS
INTRAVENOUS | Status: DC | PRN
  Administered 2014-05-05: 120 mg via INTRAVENOUS

## 2014-05-05 MED ORDER — MAGIC MOUTHWASH
15.0000 mL | Freq: Four times a day (QID) | ORAL | Status: DC | PRN
  Filled 2014-05-05: qty 15

## 2014-05-05 SURGICAL SUPPLY — 30 items
BLADE HEX COATED 2.75 (ELECTRODE) ×2 IMPLANT
BLADE SURG 15 STRL LF DISP TIS (BLADE) ×1 IMPLANT
BLADE SURG 15 STRL SS (BLADE) ×2
BLADE SURG SZ10 CARB STEEL (BLADE) ×1 IMPLANT
BNDG GAUZE ELAST 4 BULKY (GAUZE/BANDAGES/DRESSINGS) ×1 IMPLANT
BRIEF STRETCH FOR OB PAD LRG (UNDERPADS AND DIAPERS) ×2 IMPLANT
DRAPE LAPAROTOMY T 102X78X121 (DRAPES) ×2 IMPLANT
DRSG PAD ABDOMINAL 8X10 ST (GAUZE/BANDAGES/DRESSINGS) ×2 IMPLANT
ELECT REM PT RETURN 9FT ADLT (ELECTROSURGICAL) ×2
ELECTRODE REM PT RTRN 9FT ADLT (ELECTROSURGICAL) ×1 IMPLANT
GAUZE SPONGE 4X4 12PLY STRL (GAUZE/BANDAGES/DRESSINGS) ×2 IMPLANT
GAUZE SPONGE 4X4 16PLY XRAY LF (GAUZE/BANDAGES/DRESSINGS) ×2 IMPLANT
GLOVE ECLIPSE 8.0 STRL XLNG CF (GLOVE) ×2 IMPLANT
GLOVE INDICATOR 8.0 STRL GRN (GLOVE) ×2 IMPLANT
GOWN STRL REUS W/TWL XL LVL3 (GOWN DISPOSABLE) ×4 IMPLANT
KIT BASIN OR (CUSTOM PROCEDURE TRAY) ×2 IMPLANT
LEGGING LITHOTOMY PAIR STRL (DRAPES) ×1 IMPLANT
LUBRICANT JELLY K Y 4OZ (MISCELLANEOUS) ×2 IMPLANT
NEEDLE HYPO 22GX1.5 SAFETY (NEEDLE) ×2 IMPLANT
PACK BASIC VI WITH GOWN DISP (CUSTOM PROCEDURE TRAY) ×2 IMPLANT
PAD ABD 8X10 STRL (GAUZE/BANDAGES/DRESSINGS) ×1 IMPLANT
PENCIL BUTTON HOLSTER BLD 10FT (ELECTRODE) ×2 IMPLANT
SCRUB PCMX 4 OZ (MISCELLANEOUS) ×3 IMPLANT
SPONGE LAP 18X18 X RAY DECT (DISPOSABLE) ×3 IMPLANT
SYR 20CC LL (SYRINGE) ×2 IMPLANT
SYR BULB IRRIGATION 50ML (SYRINGE) ×3 IMPLANT
TAPE CLOTH SURG 6X10 WHT LF (GAUZE/BANDAGES/DRESSINGS) ×1 IMPLANT
TOWEL OR 17X26 10 PK STRL BLUE (TOWEL DISPOSABLE) ×2 IMPLANT
TOWEL OR NON WOVEN STRL DISP B (DISPOSABLE) ×2 IMPLANT
YANKAUER SUCT BULB TIP 10FT TU (MISCELLANEOUS) ×2 IMPLANT

## 2014-05-05 NOTE — Discharge Instructions (Signed)
WOUND CARE  It is important that the wound be kept open.   -Keeping the skin edges apart will allow the wound to gradually heal from the base upwards.   - If the skin edges of the wound close too early, a new fluid pocket can form and infection can occur. -This is the reason to pack deeper wounds with gauze or ribbon -This is why drained wounds cannot be sewed closed right away  A healthy wound should form a lining of bright red "beefy" granulating tissue that will help shrink the wound and help the edges grow new skin into it.   -A little mucus / yellow discharge is normal (the body's natural way to try and form a scab) and should be gently washed off with soap and water with daily dressing changes.  -Green or foul smelling drainage implies bacterial colonization and can slow wound healing - a short course of antibiotic ointment (3-5 days) can help it clear up.  Call the doctor if it does not improve or worsens  -Avoid use of antibiotic ointments for more than a week as they can slow wound healing over time.    -Sometimes other wound care products will be used to reduce need for dressing changes and/or help clean up dirty wounds -Sometimes the surgeon needs to debride the wound in the office to remove dead or infected tissue out of the wound so it can heal more quickly and safely.    Change the dressing at least once a day -Wash the wound with mild soap and water gently every day.  It is good to shower or bathe the wound to help it clean out. -Use clean 4x4 gauze for medium/large wounds or ribbon plain NU-gauze for smaller wounds (it does not need to be sterile, just clean) -Keep the raw wound moist with a little saline or KY (saline) gel on the gauze.  -A dry wound will take longer to heal.  -Keep the skin dry around the wound to prevent breakdown and irritation. -Pack the wound down to the base -The goal is to keep the skin apart, not overpack the wound -Use a Q-tip or blunt-tipped kabob  stick toothpick to push the gauze down to the base in narrow or deep wounds   -Cover with a clean gauze and tape -paper or Medipore tape tend to be gentle on the skin -rotate the orientation of the tape to avoid repeated stress/trauma on the skin -using an ACE or Coban wrap on wounds on arms or legs can be used instead.  Complete all antibiotics through the entire prescription to help the infection heal and prevent new places of infection   Returning the see the surgeon is helpful to follow the healing process and help the wound close as fast as possible.  GENERAL SURGERY: POST OP INSTRUCTIONS  1. DIET: Follow a light bland diet the first 24 hours after arrival home, such as soup, liquids, crackers, etc.  Be sure to include lots of fluids daily.  Avoid fast food or heavy meals as your are more likely to get nauseated.   2. Take your usually prescribed home medications unless otherwise directed. 3. PAIN CONTROL: a. Pain is best controlled by a usual combination of three different methods TOGETHER: i. Ice/Heat ii. Over the counter pain medication iii. Prescription pain medication b. Most patients will experience some swelling and bruising around the incisions.  Ice packs or heating pads (30-60 minutes up to 6 times a day) will help. Use  ice for the first few days to help decrease swelling and bruising, then switch to heat to help relax tight/sore spots and speed recovery.  Some people prefer to use ice alone, heat alone, alternating between ice & heat.  Experiment to what works for you.  Swelling and bruising can take several weeks to resolve.   c. It is helpful to take an over-the-counter pain medication regularly for the first few weeks.  Choose one of the following that works best for you: i. Naproxen (Aleve, etc)  Two 272m tabs twice a day ii. Ibuprofen (Advil, etc) Three 205mtabs four times a day (every meal & bedtime) iii. Acetaminophen (Tylenol, etc) 500-65021mour times a day (every  meal & bedtime) d. A  prescription for pain medication (such as oxycodone, hydrocodone, etc) should be given to you upon discharge.  Take your pain medication as prescribed.  i. If you are having problems/concerns with the prescription medicine (does not control pain, nausea, vomiting, rash, itching, etc), please call us [phone] see if we need to switch you to a different pain medicine that will work better for you and/or control your side effect better. ii. If you need a refill on your pain medication, please contact your pharmacy.  They will contact our office to request authorization. Prescriptions will not be filled after 5 pm or on week-ends. 4. Avoid getting constipated.  Between the surgery and the pain medications, it is common to experience some constipation.  Increasing fluid intake and taking a fiber supplement (such as Metamucil, Citrucel, FiberCon, MiraLax, etc) 1-2 times a day regularly will usually help prevent this problem from occurring.  A mild laxative (prune juice, Milk of Magnesia, MiraLax, etc) should be taken according to package directions if there are no bowel movements after 48 hours.   5. Wash / shower every day.  You may shower over the dressings as they are waterproof.  Continue to shower over incision(s) after the dressing is off. 6. Remove your waterproof bandages 5 days after surgery.  You may leave the incision open to air.  You may have skin tapes (Steri Strips) covering the incision(s).  Leave them on until one week, then remove.  You may replace a dressing/Band-Aid to cover the incision for comfort if you wish.      7. ACTIVITIES as tolerated:   a. You may resume regular (light) daily activities beginning the next day--such as daily self-care, walking, climbing stairs--gradually increasing activities as tolerated.  If you can walk 30 minutes without difficulty, it is safe to try more intense activity such as jogging, treadmill, bicycling, low-impact aerobics,  swimming, etc. b. Save the most intensive and strenuous activity for last such as sit-ups, heavy lifting, contact sports, etc  Refrain from any heavy lifting or straining until you are off narcotics for pain control.   c. DO NOT PUSH THROUGH PAIN.  Let pain be your guide: If it hurts to do something, don't do it.  Pain is your body warning you to avoid that activity for another week until the pain goes down. d. You may drive when you are no longer taking prescription pain medication, you can comfortably wear a seatbelt, and you can safely maneuver your car and apply brakes. e. YouDennis Basty have sexual intercourse when it is comfortable.  8. FOLLOW UP in our office a. Please call [facility] at [phone] to set up an appointment to see your surgeon in the office for a follow-up appointment approximately 2-3  weeks after your surgery. b. Make sure that you call for this appointment the day you arrive home to insure a convenient appointment time. 9. IF YOU HAVE DISABILITY OR FAMILY LEAVE FORMS, BRING THEM TO THE OFFICE FOR PROCESSING.  DO NOT GIVE THEM TO YOUR DOCTOR.   WHEN TO CALL us [phone]: 1. Poor pain control 2. Reactions / problems with new medications (rash/itching, nausea, etc)  3. Fever over 101.5 F (38.5 C) 4. Worsening swelling or bruising 5. Continued bleeding from incision. 6. Increased pain, redness, or drainage from the incision 7. Difficulty breathing / swallowing   The clinic staff is available to answer your questions during regular business hours (8:30am-5pm).  Please dont hesitate to call and ask to speak to one of our nurses for clinical concerns.   If you have a medical emergency, go to the nearest emergency room or call 911.  A surgeon from [facility] is always on call at the [facility], [place], [place], [place], [place]  [address] ? MAIN: [phone] ? TOLL FREE: [phone] ?  FAX [phone]  [phone] [email or web address]  How to Avoid Diabetes Problems You can do a lot to prevent or slow down diabetes problems. Following your diabetes plan and taking care of yourself can reduce your risk of serious or life-threatening complications. Below, you will find certain things you can do to prevent diabetes problems. MANAGE YOUR DIABETES Follow your health care provider's, nurse educator's, and dietitian's instructions for managing your diabetes. They will teach you the basics of diabetes care. They can help answer questions you may have. Learn about diabetes and make healthy choices regarding eating and physical activity. Monitor your blood glucose level regularly. Your health care provider will help you decide how often to check your blood glucose level depending on your treatment goals and how well you are meeting them.  DO NOT USE NICOTINE Nicotine and diabetes are a dangerous combination. Nicotine raises your risk for diabetes problems. If you quit using nicotine, you will lower your risk for heart attack, stroke, nerve disease, and kidney disease. Your cholesterol and your blood pressure levels may improve. Your blood circulation will also improve. Do not use any tobacco products, including cigarettes, chewing tobacco, or electronic cigarettes. If you need help quitting, ask your health care provider. KEEP YOUR BLOOD PRESSURE UNDER CONTROL Keeping your blood pressure under control will help prevent damage to your eyes, kidneys, heart, and blood vessels. Blood pressure consists of two numbers. The top number should be below 120, and the bottom number should be below 80 (120/80). Keep your blood pressure as close to these numbers as you can. If you already have kidney disease, you may want even lower blood pressure to protect your kidneys. Talk to your health care provider to make sure that your blood pressure goal is right for your needs. Meal planning, medicines, and exercise can help  you reach your blood pressure target. Have your blood pressure checked at every visit with your health care provider. KEEP YOUR CHOLESTEROL UNDER CONTROL Normal cholesterol levels will help prevent heart disease and stroke. These are the biggest health problems for people with diabetes. Keeping cholesterol levels under control can also help with blood flow. Have your cholesterol level checked at least once a year. Your health care provider may prescribe a medicine known as a statin. Statins lower your cholesterol. If you are not taking a statin, ask your health care provider if you  should be. Meal planning, exercise, and medicines can help you reach your cholesterol targets.  SCHEDULE AND KEEP YOUR ANNUAL PHYSICAL EXAMS AND EYE EXAMS Your health care provider will tell you how often he or she wants to see you depending on your plan of treatment. It is important that you keep these appointments so that possible problems can be identified early and complications can be avoided or treated.  Every visit with your health care provider should include your weight, blood pressure, and an evaluation of your blood glucose control.  Your hemoglobin A1c should be checked:  At least twice a year if you are at your goal.  Every 3 months if there are changes in treatment.  If you are not meeting your goals.  Your blood lipids should be checked yearly. You should also be checked yearly to see if you have protein in your urine (microalbumin).  Schedule a dilated eye exam within 5 years of your diagnosis if you have type 1 diabetes, and then yearly. Schedule a dilated eye exam at diagnosis if you have type 2 diabetes, and then yearly. All exams thereafter can be extended to every 2 to 3 years if one or more exams have been normal. KEEP YOUR VACCINES CURRENT The flu vaccine is recommended yearly. The formula for the vaccine changes every year and needs to be updated for the best protection against current viruses.  It is recommended that people with diabetes who are over 75 years old get the pneumonia vaccine. In some cases, two separate shots may be given. Ask your health care provider if your pneumonia vaccination is up-to-date. However, there are some instances where another vaccine is recommended. Check with your health care provider. TAKE CARE OF YOUR FEET  Diabetes may cause you to have a poor blood supply (circulation) to your legs and feet. Because of this, the skin may be thinner, break easier, and heal more slowly. You also may have nerve damage in your legs and feet, causing decreased feeling. You may not notice minor injuries to your feet that could lead to serious problems or infections. Taking care of your feet is very important. Visual foot exams are performed at every routine medical visit. The exams check for cuts, injuries, or other problems with the feet. A comprehensive foot exam should be done yearly. This includes visual inspection as well as assessing foot pulses and testing for loss of sensation. You should also do the following:  Inspect your feet daily for cuts, calluses, blisters, ingrown toenails, and signs of infection, such as redness, swelling, or pus.  Wash and dry your feet thoroughly, especially between the toes.  Avoid soaking your feet regularly in hot water baths.  Moisturize dry skin with lotion, avoiding areas between your toes.  Cut toenails straight across and file the edges.  Avoid shoes that do not fit well or have areas that irritate your skin.  Avoid going barefooted or wearing only socks. Your feet need protection. TAKE CARE OF YOUR TEETH People with poorly controlled diabetes are more likely to have gum (periodontal) disease. These infections make diabetes harder to control. Periodontal diseases, if left untreated, can lead to tooth loss. Brush your teeth twice a day, floss, and see your dentist for checkups and cleaning every 6 months, or 2 times a year. ASK  YOUR HEALTH CARE PROVIDER ABOUT TAKING ASPIRIN Taking aspirin daily is recommended to help prevent cardiovascular disease in people with and without diabetes. Ask your health care provider if this  would benefit you and what dose he or she would recommend. DRINK RESPONSIBLY Moderate amounts of alcohol (less than 1 drink per day for adult women and less than 2 drinks per day for adult men) have a minimal effect on blood glucose if ingested with food. It is important to eat food with alcohol to avoid hypoglycemia. People should avoid alcohol if they have a history of alcohol abuse or dependence, if they are pregnant, and if they have liver disease, pancreatitis, advanced neuropathy, or severe hypertriglyceridemia. LESSEN STRESS Living with diabetes can be stressful. When you are under stress, your blood glucose may be affected in two ways:  Stress hormones may cause your blood glucose to rise.  You may be distracted from taking good care of yourself. It is a good idea to be aware of your stress level and make changes that are necessary to help you better manage challenging situations. Support groups, planned relaxation, a hobby you enjoy, meditation, healthy relationships, and exercise all work to lower your stress level. If your efforts do not seem to be helping, get help from your health care provider or a trained mental health professional. Document Released: 04/22/2011 Document Revised: 12/19/2013 Document Reviewed: 09/28/2013 [facility] Patient Information 2015 [place], [place]. This information is not intended to replace advice given to you by your health care provider. Make sure you discuss any questions you have with your health care provider.   Diabetes and Standards of Medical Care Diabetes is complicated. You may find that your diabetes team includes a dietitian, nurse, diabetes educator, eye doctor, and more. To help everyone know what is going on and to help you get the care you deserve, the  following schedule of care was developed to help keep you on track. Below are the tests, exams, vaccines, medicines, education, and plans you will need. HbA1c test This test shows how well you have controlled your glucose over the past 2-3 months. It is used to see if your diabetes management plan needs to be adjusted.   It is performed at least 2 times a year if you are meeting treatment goals.  It is performed 4 times a year if therapy has changed or if you are not meeting treatment goals. Blood pressure test  This test is performed at every routine medical visit. The goal is less than 140/90 mm Hg for most people, but 130/80 mm Hg in some cases. Ask your health care provider about your goal. Dental exam  Follow up with the dentist regularly. Eye exam  If you are diagnosed with type 1 diabetes as a child, get an exam upon reaching the age of 92 years or older and have had diabetes for 3-5 years. Yearly eye exams are recommended after that initial eye exam.  If you are diagnosed with type 1 diabetes as an adult, get an exam within 5 years of diagnosis and then yearly.  If you are diagnosed with type 2 diabetes, get an exam as soon as possible after the diagnosis and then yearly. Foot care exam  Visual foot exams are performed at every routine medical visit. The exams check for cuts, injuries, or other problems with the feet.  A comprehensive foot exam should be done yearly. This includes visual inspection as well as assessing foot pulses and testing for loss of sensation.  Check your feet nightly for cuts, injuries, or other problems with your feet. Tell your health care provider if anything is not healing. Kidney function test (urine microalbumin)  This  test is performed once a year.  Type 1 diabetes: The first test is performed 5 years after diagnosis.  Type 2 diabetes: The first test is performed at the time of diagnosis.  A serum creatinine and estimated glomerular filtration  rate (eGFR) test is done once a year to assess the level of chronic kidney disease (CKD), if present. Lipid profile (cholesterol, HDL, LDL, triglycerides)  Performed every 5 years for most people.  The goal for LDL is less than 100 mg/dL. If you are at high risk, the goal is less than 70 mg/dL.  The goal for HDL is 40 mg/dL-50 mg/dL for men and 50 mg/dL-60 mg/dL for women. An HDL cholesterol of 60 mg/dL or higher gives some protection against heart disease.  The goal for triglycerides is less than 150 mg/dL. Influenza vaccine, pneumococcal vaccine, and hepatitis B vaccine  The influenza vaccine is recommended yearly.  It is recommended that people with diabetes who are over 36 years old get the pneumonia vaccine. In some cases, two separate shots may be given. Ask your health care provider if your pneumonia vaccination is up to date.  The hepatitis B vaccine is also recommended for adults with diabetes. Diabetes self-management education  Education is recommended at diagnosis and ongoing as needed. Treatment plan  Your treatment plan is reviewed at every medical visit. Document Released: 06/01/2009 Document Revised: 12/19/2013 Document Reviewed: 01/04/2013 [facility] Patient Information 2015 [place], [place]. This information is not intended to replace advice given to you by your health care provider. Make sure you discuss any questions you have with your health care provider.   Abscess An abscess is an infected area that contains a collection of pus and debris.It can occur in almost any part of the body. An abscess is also known as a furuncle or boil. CAUSES  An abscess occurs when tissue gets infected. This can occur from blockage of oil or sweat glands, infection of hair follicles, or a minor injury to the skin. As the body tries to fight the infection, pus collects in the area and creates pressure under the skin. This pressure causes pain. People with weakened immune systems have  difficulty fighting infections and get certain abscesses more often.  SYMPTOMS Usually an abscess develops on the skin and becomes a painful mass that is red, warm, and tender. If the abscess forms under the skin, you may feel a moveable soft area under the skin. Some abscesses break open (rupture) on their own, but most will continue to get worse without care. The infection can spread deeper into the body and eventually into the bloodstream, causing you to feel ill.  DIAGNOSIS  Your caregiver will take your medical history and perform a physical exam. A sample of fluid may also be taken from the abscess to determine what is causing your infection. TREATMENT  Your caregiver may prescribe antibiotic medicines to fight the infection. However, taking antibiotics alone usually does not cure an abscess. Your caregiver may need to make a small cut (incision) in the abscess to drain the pus. In some cases, gauze is packed into the abscess to reduce pain and to continue draining the area. HOME CARE INSTRUCTIONS   Only take over-the-counter or prescription medicines for pain, discomfort, or fever as directed by your caregiver.  If you were prescribed antibiotics, take them as directed. Finish them even if you start to feel better.  If gauze is used, follow your caregiver's directions for changing the gauze.  To avoid spreading the  infection:  Keep your draining abscess covered with a bandage.  Wash your hands well.  Do not share personal care items, towels, or whirlpools with others.  Avoid skin contact with others.  Keep your skin and clothes clean around the abscess.  Keep all follow-up appointments as directed by your caregiver. SEEK MEDICAL CARE IF:   You have increased pain, swelling, redness, fluid drainage, or bleeding.  You have muscle aches, chills, or a general ill feeling.  You have a fever. MAKE SURE YOU:   Understand these instructions.  Will watch your condition.  Will  get help right away if you are not doing well or get worse. Document Released: 05/14/2005 Document Revised: 02/03/2012 Document Reviewed: 10/17/2011 [facility] Patient Information 2015 [place], [place]. This information is not intended to replace advice given to you by your health care provider. Make sure you discuss any questions you have with your health care provider.

## 2014-05-05 NOTE — Progress Notes (Signed)
ANTIBIOTIC CONSULT NOTE - INITIAL  Pharmacy Consult for Vancomycin and Zosyn  Indication: Cellulitis, abscess  No Known Allergies  Patient Measurements: Height: 5' 10"  (177.8 cm) Weight: 197 lb 1.5 oz (89.4 kg) IBW/kg (Calculated) : 73 Adjusted Body Weight:   Vital Signs: Temp: 99 F (37.2 C) (09/18 0600) Temp src: Oral (09/18 0600) BP: 136/71 mmHg (09/18 0600) Pulse Rate: 102 (09/18 0600) Intake/Output from previous day: 09/17 0701 - 09/18 0700 In: 3154.7 [I.V.:3004.7; IV Piggyback:150] Out: 2050 [Urine:2050] Intake/Output from this shift: Total I/O In: 3154.7 [I.V.:3004.7; IV Piggyback:150] Out: 2050 [Urine:2050]  Labs:  Recent Labs  05/04/14 2326 05/05/14 0124 05/05/14 0515  WBC 13.6*  --  13.9*  HGB 15.1 16.0 12.8*  PLT 175  --  169  169  CREATININE 0.62 0.60 0.52   Estimated Creatinine Clearance: 116.1 ml/min (by C-G formula based on Cr of 0.52). No results found for this basename: VANCOTROUGH, Corlis Leak, VANCORANDOM, [place], GENTPEAK, GENTRANDOM, TOBRATROUGH, TOBRAPEAK, TOBRARND, AMIKACINPEAK, AMIKACINTROU, AMIKACIN,  in the last 72 hours   Microbiology: Recent Results (from the past 720 hour(s))  MRSA PCR SCREENING     Status: None   Collection Time    05/05/14  3:26 AM      Result Value Ref Range Status   MRSA by PCR NEGATIVE  NEGATIVE Final   Comment:            The GeneXpert MRSA Assay (FDA     approved for NASAL specimens     only), is one component of a     comprehensive MRSA colonization     surveillance program. It is not     intended to diagnose MRSA     infection nor to guide or     monitor treatment for     MRSA infections.    Medical History: Past Medical History  Diagnosis Date  . Hypercholesteremia   . Type 2 diabetes mellitus 1990  . Internal hemorrhoids   . Essential hypertension, benign   . Noncompliance   . CAD (coronary artery disease)     NSTEMI 06/2011:  LHC 07/18/11: Proximal diagonal 60%, distal LAD with a  diabetic appearance and 60% stenosis, OM2 with an occluded superior branch and an inferior branch with 90%, EF 55% with inferior hypokinesis.  PCI: Promus DES to the OM2 inferior branch.  This vessel provides collaterals to the superior branch which remained occluded.  Echocardiogram 07/18/11: EF 60%, normal wall motion.  . Glaucoma     Medications:  Anti-infectives   Start     Dose/Rate Route Frequency Ordered Stop   05/05/14 1200  vancomycin (VANCOCIN) 1,500 mg in sodium chloride 0.9 % 500 mL IVPB     1,500 mg 250 mL/hr over 120 Minutes Intravenous Every 12 hours 05/05/14 0628     05/05/14 0800  piperacillin-tazobactam (ZOSYN) IVPB 3.375 g     3.375 g 12.5 mL/hr over 240 Minutes Intravenous 3 times per day 05/05/14 0628     05/05/14 0115  piperacillin-tazobactam (ZOSYN) IVPB 3.375 g     3.375 g 100 mL/hr over 30 Minutes Intravenous  Once 05/05/14 0112 05/05/14 0314   05/05/14 0115  clindamycin (CLEOCIN) IVPB 900 mg     900 mg 100 mL/hr over 30 Minutes Intravenous  Once 05/05/14 0115 05/05/14 0219   05/05/14 0000  vancomycin (VANCOCIN) IVPB 1000 mg/200 mL premix     1,000 mg 200 mL/hr over 60 Minutes Intravenous  Once 05/04/14 2345 05/05/14 0340     Assessment: Patient  with abscess, cellulitis.  S/P I&D of left buttock abscess.  Goal of Therapy:  Vancomycin trough level 15-20 mcg/ml (due to abscess) Zosyn based on renal function   Plan:  Measure antibiotic drug levels at steady state Follow up culture results Vancomycin 1573m iv q12hr  Zosyn 3.375g IV Q8H infused over 4hrs.   GTyler Deis JShea StakesCrowford 05/05/2014,6:29 AM

## 2014-05-05 NOTE — Anesthesia Postprocedure Evaluation (Signed)
Anesthesia Post Note  Patient: Derek Lowery  Procedure(s) Performed: Procedure(s) (LRB): IRRIGATION AND DEBRIDEMENT ABSCESS left buttock (Left)  Anesthesia type: General  Patient location: PACU  Post pain: Pain level controlled  Post assessment: Post-op Vital signs reviewed  Last Vitals: BP 132/76  Pulse 105  Temp(Src) 36.9 C (Oral)  Resp 22  Ht 5' 10"  (1.778 m)  Wt 197 lb 1.5 oz (89.4 kg)  BMI 28.28 kg/m2  SpO2 100%  Post vital signs: Reviewed  Level of consciousness: sedated  Complications: No apparent anesthesia complications

## 2014-05-05 NOTE — Op Note (Signed)
05/04/2014 - 05/05/2014  4:21 AM  PATIENT:  Derek Lowery  58 y.o. male  Patient Care Team: Willey Blade, MD as PCP - General (Internal Medicine) Hillary Bow, MD as Consulting Physician (Cardiology) Juanita Craver, MD as Consulting Physician (Gastroenterology)  PRE-OPERATIVE DIAGNOSIS:  Abscess of buttock, left [682.5]  POST-OPERATIVE DIAGNOSIS:  FOURNIER'S GANGRENE INTO PELVIS  PROCEDURE:  Procedure(s): IRRIGATION AND DEBRIDEMENT FOURNIER'S GANGRENE TO PELVIC PRESACRAL SPACE  SURGEON:  Surgeon(s): Michael Boston, MD  ASSISTANT: RN   ANESTHESIA:   general  EBL:  Total I/O In: 1017 [I.V.:1000; IV Piggyback:150] Out: -   Delay start of Pharmacological VTE agent (>24hrs) due to surgical blood loss or risk of bleeding:  no  DRAINS: none   SPECIMEN:  Source of Specimen:  ABSCESS FLUID  DISPOSITION OF SPECIMEN:  MICROBIOLOGY  COUNTS:  YES  PLAN OF CARE: Admit to inpatient   PATIENT DISPOSITION:  PACU - hemodynamically stable.  INDICATION: Poorly controlled diabetic with noncompliance with at least 4 day history of painful abscess on left buttock.  Came in septic shock and ketoacidosis with obvious gas-filled abscess on left gluteus.  I recommended emergent incision and drainage.  The anatomy and physiology of skin abscesses was discussed. Pathophysiology of SQ abscess, possible progression to fasciitis & sepsis, etc discussed . I stressed good hygiene & wound care. Possible redebridement was discussed as well.   Possibility of recurrence was discussed. Risks, benefits, alternatives were discussed. I noted a good likelihood this will help address the problem. Risks of anesthesia and other risks discussed. Questions answered. The patient is does wish to proceed.   OR FINDINGS:   20 cm deep abscess and crepitus from left inner buttock along presacral space into the true pelvis.    Wound measures 8cm by 6cm.  It is 20cm deep   DESCRIPTION:   Informed consent was  confirmed. The patient received IV antibiotics. The patient underwent general anesthesia without any difficulty. The patient was positioned in high lithotomy. SCDs were active during the entire case. The area around the abscess was prepped and draped in a sterile fashion. A surgical timeout confirmed our plan.   I aspirated over the most fluctuant area mass and aspirated gas and purulent brackish fluid.  I sent that for culture  I made an incision over the most fluctuant area of the mass On the left inner thigh between the border between the perirectal and inner thigh.  Mated in the sagittal plane.   I placed my finger into the abscess cavity to break up loculations.  The incision was extended to adequately expose the entire cavity.  I could push tissues up into the true pelvis over the sacrum along the posterior and left lateral mesorectal wall.  Purulent drainage and cellulitis.  No major ball of pus.  We did copious irrigation. The fascia was viable.  I sharply debrided out some necrotic fat with scalpel and scissors.   I did sharp debridement of skin with a scalpel to allow a nice 8cm by 6 cm open wound. We took extra care to ensure hemostasis.   The wound was packed with 4.5" Kerlix antibiotic-soaked rolled gauze x 1 roll.  Sterile dressings applied.  Patient is being extubated go to recovery room.   We plan to continue IV antibiotics and begin wound care training tomorrow.   I am about to try & locate family to discuss OR findings.  No one has seen anybody yet.  Adin Hector, M.D., F.A.C.S. Gastrointestinal  and Minimally Invasive Surgery [facility] [address] [place], [place] [address] [phone] Main / Paging

## 2014-05-05 NOTE — Consult Note (Signed)
[facility]  [address] [place], [place] [address] Phone: [phone] FAX: [place]  1955/12/06 [id number]  CARE TEAM:  PCP: Salena Saner., MD  Outpatient Care Team: Patient Care Team: Willey Blade, MD as PCP - General (Internal Medicine)  Inpatient Treatment Team: Treatment Team: Attending Provider: Collene Gobble, MD; Technician: Romelle Starcher, EMT; Physician Assistant: Montine Circle, PA-C; Registered Nurse: Waunita Schooner, RN; Registered Nurse: Murvin Donning Ward, RN; Consulting Physician: Nolon Nations, MD; Rounding Team: Md Pccm, MD  This patient is a 58 y.o.male who presents today for surgical evaluation at the request of Montine Circle PA.   Reason for evaluation: buttock cellulitis.  ?Necrotizing fasciitis  Diabetic male with a history of noncompliance.  Does not see doctors.  Does not take medications and indications.  He noted a painful lump on his left buttock for a while.  He told one Dr. One week.  He tells me about 4 days.  Became more painful.  No longer be able to sit or line on it.  His brother finally convinced him to come the emergency room and drove them in the middle the night.  Patient tachycardic with elevated white count.  Glucose >300.  Suspicious for DKA.  Pain moderate.  CT scan done.  Initial read concerning for necrotizing fasciitis.  Critical care and surgical consultations requested.  Patient denies any history of prior infections.  Does not know what his blood sugar is.  Does not take any medicine.  Range he has no insurance.  Has not tried to get Medicaid or Medicare.  Has been seen by doctors in this community frequently.  Past Medical History  Diagnosis Date  . Hypercholesteremia   . Type 2 diabetes mellitus 1990  . Internal hemorrhoids   . Essential hypertension, benign   . Noncompliance   . CAD (coronary artery disease)     NSTEMI 06/2011:  LHC 07/18/11: Proximal  diagonal 60%, distal LAD with a diabetic appearance and 60% stenosis, OM2 with an occluded superior branch and an inferior branch with 90%, EF 55% with inferior hypokinesis.  PCI: Promus DES to the OM2 inferior branch.  This vessel provides collaterals to the superior branch which remained occluded.  Echocardiogram 07/18/11: EF 60%, normal wall motion.  Marland Kitchen Glaucoma     Past Surgical History  Procedure Laterality Date  . Colonoscopy  2000    Dr. Collene Mares    History   Social History  . Marital Status: Single    Spouse Name: N/A    Number of Children: N/A  . Years of Education: N/A   Occupational History  . Janitor    Social History Main Topics  . Smoking status: Never Smoker   . Smokeless tobacco: Never Used  . Alcohol Use: No  . Drug Use: No  . Sexual Activity: No   Other Topics Concern  . Not on file   Social History Narrative   Works at [employer]    Family History  Problem Relation Age of Onset  . Coronary artery disease Father     Developed in his 11s  . Kidney disease Father   . Diabetes Father   . Heart failure Mother   . Hypertension Mother   . Diabetes Sister   . Diabetes Brother   . Melanoma Father     Current Facility-Administered Medications  Medication Dose Route Frequency Provider Last Rate Last  Dose  . 0.9 %  sodium chloride infusion  1,000 mL Intravenous Once Ankit Nanavati, MD       Followed by  . 0.9 %  sodium chloride infusion  1,000 mL Intravenous Once Ankit Nanavati, MD       Followed by  . 0.9 %  sodium chloride infusion  1,000 mL Intravenous Continuous Ankit Nanavati, MD      . 0.9 %  sodium chloride infusion  250 mL Intravenous PRN Vishal Mungal, MD      . 0.9 %  sodium chloride infusion   Intravenous Continuous Vishal Mungal, MD      . acetaminophen (TYLENOL) suppository 650 mg  650 mg Rectal Q6H PRN Michael Boston, MD      . acetaminophen (TYLENOL) tablet 325-650 mg  325-650 mg Oral Q6H PRN Michael Boston, MD       . alum & mag hydroxide-simeth (MAALOX/MYLANTA) 200-200-20 MG/5ML suspension 30 mL  30 mL Oral Q6H PRN Michael Boston, MD      . aspirin chewable tablet 324 mg  324 mg Oral NOW Vishal Mungal, MD       Or  . aspirin suppository 300 mg  300 mg Rectal NOW Vishal Mungal, MD      . bisacodyl (DULCOLAX) suppository 10 mg  10 mg Rectal Q12H PRN Michael Boston, MD      . chlorhexidine (HIBICLENS) 4 % liquid 1 application  1 application Topical Once Michael Boston, MD      . Derrill Memo ON 05/06/2014] chlorhexidine (HIBICLENS) 4 % liquid 1 application  1 application Topical Once Michael Boston, MD      . clindamycin (CLEOCIN) IVPB 900 mg  900 mg Intravenous Once Montine Circle, PA-C 100 mL/hr at 05/05/14 0149 900 mg at 05/05/14 0149  . dextrose 5 %-0.45 % sodium chloride infusion   Intravenous Continuous Ankit Nanavati, MD      . diphenhydrAMINE (BENADRYL) injection 12.5-25 mg  12.5-25 mg Intravenous Q6H PRN Michael Boston, MD      . fentaNYL (SUBLIMAZE) injection 25-50 mcg  25-50 mcg Intravenous Q1H PRN Michael Boston, MD      . heparin injection 5,000 Units  5,000 Units Subcutaneous 3 times per day Vishal Mungal, MD      . insulin aspart (novoLOG) injection 1-3 Units  1-3 Units Subcutaneous 6 times per day Vishal Mungal, MD      . insulin regular (NOVOLIN R,HUMULIN R) 250 Units in sodium chloride 0.9 % 250 mL (1 Units/mL) infusion   Intravenous Continuous Vishal Mungal, MD      . lactated ringers bolus 1,000 mL  1,000 mL Intravenous Q8H PRN Michael Boston, MD      . lip balm (CARMEX) ointment 1 application  1 application Topical BID Michael Boston, MD      . living well with diabetes book MISC   Does not apply Once Michael Boston, MD      . magic mouthwash  15 mL Oral QID PRN Michael Boston, MD      . ondansetron [facility]) injection 4 mg  4 mg Intravenous Q6H PRN Michael Boston, MD       Or  . ondansetron (ZOFRAN) 8 mg/NS 50 ml IVPB  8 mg Intravenous Q6H PRN Michael Boston, MD      . ondansetron [facility]) tablet 4 mg  4 mg Oral Q6H  PRN Michael Boston, MD      . oxyCODONE (Oxy IR/ROXICODONE) immediate release tablet 5-10 mg  5-10 mg Oral Q4H PRN Michael Boston,  MD      . piperacillin-tazobactam (ZOSYN) IVPB 3.375 g  3.375 g Intravenous Once Varney Biles, MD      . promethazine (PHENERGAN) injection 6.25-12.5 mg  6.25-12.5 mg Intravenous Q4H PRN Michael Boston, MD      . vancomycin (VANCOCIN) IVPB 1000 mg/200 mL premix  1,000 mg Intravenous Once Varney Biles, MD      . vitamin C (ASCORBIC ACID) tablet 500 mg  500 mg Oral BID Michael Boston, MD       Current Outpatient Prescriptions  Medication Sig Dispense Refill  . Aspirin-Acetaminophen-Caffeine (GOODY HEADACHE PO) Take 1 Package by mouth daily as needed (headache).         No Known Allergies  ROS: Constitutional:  No fevers, chills, sweats.  Weight stable Eyes:  No vision changes, No discharge HENT:  No sore throats, nasal drainage Lymph: No neck swelling, No bruising easily Pulmonary:  No cough, productive sputum CV: No orthopnea, PND   No exertional chest/neck/shoulder/arm pain. GI:  No personal nor family history of GI/colon cancer, inflammatory bowel disease, irritable bowel syndrome, allergy such as Celiac Sprue, dietary/dairy problems, colitis, ulcers nor gastritis.  No recent sick contacts/gastroenteritis.  No travel outside the country.  No changes in diet. Renal: No UTIs, No hematuria Genital:  No drainage, bleeding, masses Musculoskeletal: No severe joint pain.  Good ROM major joints Skin:  No sores or lesions.  No rashes Heme/Lymph:  No easy bleeding.  No swollen lymph nodes Neuro: No focal weakness/numbness.  No seizures Psych: No suicidal ideation.  No hallucinations  BP 136/73  Pulse 114  Temp(Src) 98.5 F (36.9 C) (Oral)  Resp 16  Ht 5' 10"  (1.778 m)  Wt 210 lb (95.255 kg)  BMI 30.13 kg/m2  SpO2 98%  Physical Exam: General: Pt awake/alert/oriented x4 in mild major acute distress Eyes: PERRL, normal EOM. Sclera nonicteric Neuro: CN II-XII  intact w/o focal sensory/motor deficits. Lymph: No head/neck/groin lymphadenopathy Psych:  No delerium/psychosis/paranoia HENT: Normocephalic, Mucus membranes moist.  No thrush Neck: Supple, No tracheal deviation Chest: No pain.  Good respiratory excursion. CV:  Pulses intact.  Regular rhythm.  HR 100-115 Abdomen: Soft, Nondistended.  Nontender.  No incarcerated hernias. GU:  NEMG.  Left buttock 15x15cm erythema with 5cm fluctuance inner buttock / perirectal.  No crepitis Ext:  SCDs BLE.  No significant edema.  No cyanosis Skin: No petechiae / purpurea.  No major sores Musculoskeletal: No severe joint pain.  Good ROM major joints   Results:   Labs: Results for orders placed during the hospital encounter of 05/04/14 (from the past 48 hour(s))  CBC WITH DIFFERENTIAL     Status: Abnormal   Collection Time    05/04/14 11:26 PM      Result Value Ref [place]   WBC 13.6 (*) 4.0 - 10.5 K/uL   RBC 5.36  4.22 - 5.81 MIL/uL   Hemoglobin 15.1  13.0 - 17.0 g/dL   HCT 43.3  39.0 - 52.0 %   MCV 80.8  78.0 - 100.0 fL   MCH 28.2  26.0 - 34.0 pg   MCHC 34.9  30.0 - 36.0 g/dL   RDW 12.5  11.5 - 15.5 %   Platelets 175  150 - 400 K/uL   Comment: LARGE PLATELETS PRESENT   Neutrophils Relative % 84 (*) 43 - 77 %   Neutro Abs 11.4 (*) 1.7 - 7.7 K/uL   Lymphocytes Relative 10 (*) 12 - 46 %   Lymphs Abs 1.4  0.7 -  4.0 K/uL   Monocytes Relative 6  3 - 12 %   Monocytes Absolute 0.8  0.1 - 1.0 K/uL   Eosinophils Relative 0  0 - 5 %   Eosinophils Absolute 0.0  0.0 - 0.7 K/uL   Basophils Relative 0  0 - 1 %   Basophils Absolute 0.0  0.0 - 0.1 K/uL  BASIC METABOLIC PANEL     Status: Abnormal   Collection Time    05/04/14 11:26 PM      Result Value Ref Range   Sodium 130 (*) 137 - 147 mEq/L   Potassium 4.5  3.7 - 5.3 mEq/L   Chloride 95 (*) 96 - 112 mEq/L   CO2 10 (*) 19 - 32 mEq/L   Comment: CRITICAL RESULT CALLED TO, READ BACK BY AND VERIFIED WITH:     JOHNSON,L/ED @0023  ON 05/05/14 BY  KARCZEWSKI,S.   Glucose, Bld 307 (*) 70 - 99 mg/dL   BUN 18  6 - 23 mg/dL   Creatinine, Ser 0.62  0.50 - 1.35 mg/dL   Calcium 9.6  8.4 - 10.5 mg/dL   GFR calc non Af Amer >90  >90 mL/min   GFR calc Af Amer >90  >90 mL/min   Comment: (NOTE)     The eGFR has been calculated using the CKD EPI equation.     This calculation has not been validated in all clinical situations.     eGFR's persistently <90 mL/min signify possible Chronic Kidney     Disease.   Anion gap 25 (*) 5 - 15   Comment: RESULT CHECKED  I-STAT CG4 LACTIC ACID, ED     Status: None   Collection Time    05/05/14 12:03 AM      Result Value Ref Range   Lactic Acid, Venous 1.21  0.5 - 2.2 mmol/L  CBG MONITORING, ED     Status: Abnormal   Collection Time    05/05/14 12:06 AM      Result Value Ref Range   Glucose-Capillary 274 (*) 70 - 99 mg/dL  BLOOD GAS, VENOUS     Status: Abnormal   Collection Time    05/05/14  1:10 AM      Result Value Ref Range   FIO2 0.21     Delivery systems ROOM AIR     pH, Ven 7.289  7.250 - 7.300   pCO2, Ven 30.6 (*) 45.0 - 50.0 mmHg   pO2, Ven VALUE BELOW REPORTABLE RANGE  30.0 - 45.0 mmHg   Comment:  Leslye Peer, RN AT 0118 BY ANNALISSA AGUSTIN,RRT,RCP ON 05/05/14     VALUE BELOW REPORTABLE RANGE     CRITICAL RESULT CALLED TO, READ BACK BY AND VERIFIED WITH:     Leslye Peer, RN AT 0118 BY ANNALISSA AGUSTIN,RRT,RCP ON 05/05/14   Bicarbonate 14.2 (*) 20.0 - 24.0 mEq/L   TCO2 12.8  0 - 100 mmol/L   Acid-base deficit 10.9 (*) 0.0 - 2.0 mmol/L   O2 Saturation 76.7     Patient temperature 98.6     Collection site VEIN     Drawn by COLLECTED BY LABORATORY     Sample type VEIN    I-STAT CHEM 8, ED     Status: Abnormal   Collection Time    05/05/14  1:24 AM      Result Value Ref Range   Sodium 131 (*) 137 - 147 mEq/L   Potassium 4.3  3.7 - 5.3 mEq/L   Chloride 103  96 -  112 mEq/L   BUN 17  6 - 23 mg/dL   Creatinine, Ser 0.60  0.50 - 1.35 mg/dL   Glucose, Bld 285 (*) 70 - 99 mg/dL    Calcium, Ion 1.14  1.12 - 1.23 mmol/L   TCO2 14  0 - 100 mmol/L   Hemoglobin 16.0  13.0 - 17.0 g/dL   HCT 47.0  39.0 - 52.0 %  CBG MONITORING, ED     Status: Abnormal   Collection Time    05/05/14  1:31 AM      Result Value Ref Range   Glucose-Capillary 301 (*) 70 - 99 mg/dL    Imaging / Studies: Ct Pelvis W Contrast  05/05/2014   CLINICAL DATA:  Evaluate for left buttock abscess. Left buttock erythema and induration. Leukocytosis.  EXAM: CT PELVIS WITH CONTRAST  TECHNIQUE: Multidetector CT imaging of the pelvis was performed using the standard protocol following the bolus administration of intravenous contrast.  CONTRAST:  190m OMNIPAQUE IOHEXOL 300 MG/ML  SOLN  COMPARISON:  Abdominal radiograph performed 07/10/2012  FINDINGS: Significant focal soft tissue air is noted at the medial left buttock, compatible with infection from a gas producing organism. This raises concern for necrotizing fasciitis. No fluid collection is seen to suggest a drainable abscess. Diffuse surrounding soft tissue inflammation extends laterally along the left buttock and inferiorly along the perineal soft tissues.  Mild soft tissue inflammation extends superiorly to the level of the coccyx, though there is no evidence of osseous involvement at this time. No acute osseous abnormalities are seen. There is a calcified disc protrusion at L4-L5, without evidence of impression on exiting nerve roots at this level.  Visualized small and large bowel loops are grossly unremarkable in appearance. The bladder is mildly distended and grossly unremarkable. The prostate remains normal in size. No inguinal lymphadenopathy is seen.  Visualized vasculature is grossly unremarkable in appearance. Scattered vascular calcifications are seen within the pelvis.  IMPRESSION: 1. Significant focal soft tissue air at the medial left buttock, compatible with infection from a gas producing organism. This raises concern for necrotizing fasciitis. No  drainable abscess seen at this time. 2. Diffuse soft tissue inflammation extends laterally along the left buttock and inferiorly along the perineal soft tissues, with minimal soft tissue inflammation extending superiorly to the level of the coccyx. No evidence of osseous involvement.  These results were called by telephone at the time of interpretation on 05/05/2014 at 1:08 am to Dr. AVarney Biles who verbally acknowledged these results.   Electronically Signed   By: JGarald BaldingM.D.   On: 05/05/2014 01:13   Dg Chest Port 1 View  05/05/2014   CLINICAL DATA:  Abscess.  Diabetic.  EXAM: PORTABLE CHEST - 1 VIEW  COMPARISON:  07/10/2012  FINDINGS: The film is made with shallow lung inflation. Heart size is normal. Lungs are clear.  IMPRESSION: No evidence for acute  abnormality.   Electronically Signed   By: BShon HaleM.D.   On: 05/05/2014 01:33    Medications / Allergies: per chart  Antibiotics: Anti-infectives   Start     Dose/Rate Route Frequency Ordered Stop   05/05/14 0115  piperacillin-tazobactam (ZOSYN) IVPB 3.375 g     3.375 g 100 mL/hr over 30 Minutes Intravenous  Once 05/05/14 0112     05/05/14 0115  clindamycin (CLEOCIN) IVPB 900 mg     900 mg 100 mL/hr over 30 Minutes Intravenous  Once 05/05/14 0115     05/05/14 0000  vancomycin (VANCOCIN)  IVPB 1000 mg/200 mL premix     1,000 mg 200 mL/hr over 60 Minutes Intravenous  Once 05/04/14 2345        Assessment  Denton Meek Busbin  58 y.o. male       Problem List:  Principal Problem:   Fournier's gangrene Active Problems:   Sepsis affecting skin   Noncompliance with diabetes treatment   Left buttock abscess with surrounding cellulitis.  CT scan showing gas.  Suspicious for Fournier's gangrene.  No evidence of true necrotizing fasciitis.  Poorly controlled diabetes with probable diabetic ketoacidosis in the setting of noncompliance.  Plan:  Admit.  IV antibiotics.  Aggressive diabetic glucose control.  Agree with  critical care consultation for stabilization.  Patient will need better diabetic care.  See what community supports can be provided for Medicare/Medicaid.  I had a very frank discussion with the patient about his imminent risk of a major complications if his diabetes remains uncontrolled.  This infection is a side effect of that as well.  I strongly recommend that he get regular medical and diabetic care.  Did not get a sense of interest at this time.  We will continue to support and educated him this admission.  Ultimately it is up to him to get treatment.  The patient requires emergent incision and drainage given the fact he has evidence of gas and Fournier's gangrene to prevent this from turning into something worse.  Infection will not get under control and told to strain.  I discussed with this to the patient:  The anatomy and physiology of skin abscesses was discussed. Pathophysiology of SQ abscess, possible progression to fasciitis & sepsis, etc discussed . I stressed good hygiene & wound care.  Possible redebridement was discussed as well.  Possibility of recurrence was discussed. Risks, benefits, alternatives were discussed. I noted a good likelihood this will help address the problem.   Risks of anesthesia and other risks discussed. Questions answered.  The patient does wish to proceed.   -VTE prophylaxis- SCDs, etc  -mobilize as tolerated to help recovery    Adin Hector, M.D., F.A.C.S. Gastrointestinal and Minimally Invasive Surgery [facility] [address] [place], [place] [address] [phone] Main / Paging   05/05/2014  Note: Portions of this report may have been transcribed using voice recognition software. Every effort was made to ensure accuracy; however, inadvertent computerized transcription errors may be present.   Any transcriptional errors that result from this process are unintentional.

## 2014-05-05 NOTE — ED Notes (Signed)
Patient transported to CT 

## 2014-05-05 NOTE — Transfer of Care (Signed)
Immediate Anesthesia Transfer of Care Note  Patient: Derek Lowery  Procedure(s) Performed: Procedure(s) (LRB): IRRIGATION AND DEBRIDEMENT ABSCESS left buttock (Left)  Patient Location: PACU  Anesthesia Type: General  Level of Consciousness: sedated, patient cooperative and responds to stimulation  Airway & Oxygen Therapy: Patient Spontanous Breathing and Patient connected to face mask oxgen  Post-op Assessment: Report given to PACU RN and Post -op Vital signs reviewed and stable  Post vital signs: Reviewed and stable  Complications: No apparent anesthesia complications

## 2014-05-05 NOTE — Anesthesia Preprocedure Evaluation (Addendum)
Anesthesia Evaluation  Patient identified by MRN, date of birth, ID band Patient awake    Reviewed: Allergy & Precautions, H&P , NPO status , Patient's Chart, lab work & pertinent test results  Airway Mallampati: II TM Distance: >3 FB Neck ROM: Full    Dental no notable dental hx.    Pulmonary neg pulmonary ROS,  breath sounds clear to auscultation  Pulmonary exam normal       Cardiovascular Exercise Tolerance: Poor hypertension, Pt. on medications + CAD and + Cardiac Stents Rhythm:Regular Rate:Normal     Neuro/Psych negative neurological ROS  negative psych ROS   GI/Hepatic negative GI ROS, Neg liver ROS,   Endo/Other  diabetes, Poorly Controlled, Type 2  Renal/GU negative Renal ROS     Musculoskeletal negative musculoskeletal ROS (+)   Abdominal   Peds  Hematology negative hematology ROS (+)   Anesthesia Other Findings   Reproductive/Obstetrics                         Anesthesia Physical Anesthesia Plan  ASA: III and emergent  Anesthesia Plan: General   Post-op Pain Management:    Induction: Intravenous and Rapid sequence  Airway Management Planned: Oral ETT  Additional Equipment:   Intra-op Plan:   Post-operative Plan: Extubation in OR  Informed Consent: I have reviewed the patients History and Physical, chart, labs and discussed the procedure including the risks, benefits and alternatives for the proposed anesthesia with the patient or authorized representative who has indicated his/her understanding and acceptance.   Dental advisory given  Plan Discussed with: CRNA  Anesthesia Plan Comments: (Had long discussion with patient regarding his unmanaged diabetes and CV disease. In light of his known multivessel CAD and prior DES placement ~3 yrs ago, and no follow up since 12/2011 I consider pt at high risk for periop MI. The patient expresses understanding.)        Anesthesia Quick Evaluation

## 2014-05-05 NOTE — ED Provider Notes (Signed)
CSN: [id number]     Arrival date & time 05/04/14  1951 History   First MD Initiated Contact with Patient 05/04/14 2251     Chief Complaint  Patient presents with  . Abscess     (Consider location/radiation/quality/duration/timing/severity/associated sxs/prior Treatment) HPI Comments: Patient with history of diabetes, presents to the emergency department with chief complaint of abscess. He states that he noticed an abscess on his left buttock about a week ago. He states that it is progressively worsened. It is painful to palpation and worsened with movement and sitting. He denies any fevers, but states that he has had chills. He has not taken anything to alleviate the symptoms. He has never had an abscess before.  The history is provided by the patient. No language interpreter was used.    Past Medical History  Diagnosis Date  . Hypercholesteremia   . Type 2 diabetes mellitus 1990  . Internal hemorrhoids   . Essential hypertension, benign   . Noncompliance   . CAD (coronary artery disease)     NSTEMI 06/2011:  LHC 07/18/11: Proximal diagonal 60%, distal LAD with a diabetic appearance and 60% stenosis, OM2 with an occluded superior branch and an inferior branch with 90%, EF 55% with inferior hypokinesis.  PCI: Promus DES to the OM2 inferior branch.  This vessel provides collaterals to the superior branch which remained occluded.  Echocardiogram 07/18/11: EF 60%, normal wall motion.  Marland Kitchen Glaucoma    Past Surgical History  Procedure Laterality Date  . Colonoscopy  2000    Dr. Collene Mares   Family History  Problem Relation Age of Onset  . Coronary artery disease Father     Developed in his 17s  . Kidney disease Father   . Diabetes Father   . Heart failure Mother   . Hypertension Mother   . Diabetes Sister   . Diabetes Brother   . Melanoma Father    History  Substance Use Topics  . Smoking status: Never Smoker   . Smokeless tobacco: Never Used  . Alcohol Use: No    Review of  Systems  Constitutional: Negative for fever and chills.  Respiratory: Negative for shortness of breath.   Cardiovascular: Negative for chest pain.  Gastrointestinal: Negative for nausea, vomiting, diarrhea and constipation.  Genitourinary: Negative for dysuria.  All other systems reviewed and are negative.     Allergies  Review of patient's allergies indicates no known allergies.  Home Medications   Prior to Admission medications   Medication Sig Start Date End Date Taking? Authorizing Provider  Aspirin-Acetaminophen-Caffeine (GOODY HEADACHE PO) Take 1 Package by mouth daily as needed (headache).   Yes Historical Provider, MD   BP 136/73  Pulse 114  Temp(Src) 98.5 F (36.9 C) (Oral)  Resp 16  Ht 5' 10"  (1.778 m)  Wt 210 lb (95.255 kg)  BMI 30.13 kg/m2  SpO2 98% Physical Exam  Nursing note and vitals reviewed. Constitutional: He is oriented to person, place, and time. He appears well-developed and well-nourished.  HENT:  Head: Normocephalic and atraumatic.  Eyes: Conjunctivae and EOM are normal. Pupils are equal, round, and reactive to light. Right eye exhibits no discharge. Left eye exhibits no discharge. No scleral icterus.  Neck: Normal range of motion. Neck supple. No JVD present.  Cardiovascular: Regular rhythm and normal heart sounds.  Exam reveals no gallop and no friction rub.   No murmur heard. Tachycardic  Pulmonary/Chest: Effort normal and breath sounds normal. No respiratory distress. He has no wheezes. He has  no rales. He exhibits no tenderness.  Abdominal: Soft. He exhibits no distension and no mass. There is no tenderness. There is no rebound and no guarding.  Genitourinary:  Possible Fournier Gangrene, left buttock remarkable for a large cellulitis, possibly extending to the rectum, DRE is remarkable for tenderness to palpation on the left side, no pus or blood is expressed  Musculoskeletal: Normal range of motion. He exhibits no edema and no tenderness.   Neurological: He is alert and oriented to person, place, and time.  Skin: Skin is warm and dry.  Cellulitis of left buttock, approximately 8 x 8 cm  Psychiatric: He has a normal mood and affect. His behavior is normal. Judgment and thought content normal.    ED Course  Procedures (including critical care time) Results for orders placed during the hospital encounter of 05/04/14  CBC WITH DIFFERENTIAL      Result Value Ref Range   WBC 13.6 (*) 4.0 - 10.5 K/uL   RBC 5.36  4.22 - 5.81 MIL/uL   Hemoglobin 15.1  13.0 - 17.0 g/dL   HCT 43.3  39.0 - 52.0 %   MCV 80.8  78.0 - 100.0 fL   MCH 28.2  26.0 - 34.0 pg   MCHC 34.9  30.0 - 36.0 g/dL   RDW 12.5  11.5 - 15.5 %   Platelets 175  150 - 400 K/uL   Neutrophils Relative % 84 (*) 43 - 77 %   Neutro Abs 11.4 (*) 1.7 - 7.7 K/uL   Lymphocytes Relative 10 (*) 12 - 46 %   Lymphs Abs 1.4  0.7 - 4.0 K/uL   Monocytes Relative 6  3 - 12 %   Monocytes Absolute 0.8  0.1 - 1.0 K/uL   Eosinophils Relative 0  0 - 5 %   Eosinophils Absolute 0.0  0.0 - 0.7 K/uL   Basophils Relative 0  0 - 1 %   Basophils Absolute 0.0  0.0 - 0.1 K/uL  BASIC METABOLIC PANEL      Result Value Ref Range   Sodium 130 (*) 137 - 147 mEq/L   Potassium 4.5  3.7 - 5.3 mEq/L   Chloride 95 (*) 96 - 112 mEq/L   CO2 10 (*) 19 - 32 mEq/L   Glucose, Bld 307 (*) 70 - 99 mg/dL   BUN 18  6 - 23 mg/dL   Creatinine, Ser 0.62  0.50 - 1.35 mg/dL   Calcium 9.6  8.4 - 10.5 mg/dL   GFR calc non Af Amer >90  >90 mL/min   GFR calc Af Amer >90  >90 mL/min   Anion gap 25 (*) 5 - 15  I-STAT CG4 LACTIC ACID, ED      Result Value Ref Range   Lactic Acid, Venous 1.21  0.5 - 2.2 mmol/L  CBG MONITORING, ED      Result Value Ref Range   Glucose-Capillary 274 (*) 70 - 99 mg/dL   Ct Pelvis W Contrast  05/05/2014   CLINICAL DATA:  Evaluate for left buttock abscess. Left buttock erythema and induration. Leukocytosis.  EXAM: CT PELVIS WITH CONTRAST  TECHNIQUE: Multidetector CT imaging of the  pelvis was performed using the standard protocol following the bolus administration of intravenous contrast.  CONTRAST:  166m OMNIPAQUE IOHEXOL 300 MG/ML  SOLN  COMPARISON:  Abdominal radiograph performed 07/10/2012  FINDINGS: Significant focal soft tissue air is noted at the medial left buttock, compatible with infection from a gas producing organism. This raises concern for necrotizing  fasciitis. No fluid collection is seen to suggest a drainable abscess. Diffuse surrounding soft tissue inflammation extends laterally along the left buttock and inferiorly along the perineal soft tissues.  Mild soft tissue inflammation extends superiorly to the level of the coccyx, though there is no evidence of osseous involvement at this time. No acute osseous abnormalities are seen. There is a calcified disc protrusion at L4-L5, without evidence of impression on exiting nerve roots at this level.  Visualized small and large bowel loops are grossly unremarkable in appearance. The bladder is mildly distended and grossly unremarkable. The prostate remains normal in size. No inguinal lymphadenopathy is seen.  Visualized vasculature is grossly unremarkable in appearance. Scattered vascular calcifications are seen within the pelvis.  IMPRESSION: 1. Significant focal soft tissue air at the medial left buttock, compatible with infection from a gas producing organism. This raises concern for necrotizing fasciitis. No drainable abscess seen at this time. 2. Diffuse soft tissue inflammation extends laterally along the left buttock and inferiorly along the perineal soft tissues, with minimal soft tissue inflammation extending superiorly to the level of the coccyx. No evidence of osseous involvement.  These results were called by telephone at the time of interpretation on 05/05/2014 at 1:08 am to Dr. Varney Biles, who verbally acknowledged these results.   Electronically Signed   By: Garald Balding M.D.   On: 05/05/2014 01:13      Imaging Review No results found.   EKG Interpretation None      MDM   Final diagnoses:  Necrotizing fasciitis  Diabetic ketoacidosis without coma associated with type 2 diabetes mellitus    Patient with cellulitis first abscess of the left buttock. Will get a CT scan of the pelvis to rule out perirectal abscess. Patient seen by and discussed with Dr. Kathrynn Humble.  Will start antibiotics.  Patient down to be in DKA. I will give fluids, glucose stabilizer.  CRITICAL CARE Performed by: Montine Circle   Total critical care time: 45  Critical care time was exclusive of separately billable procedures and treating other patients.  Critical care was necessary to treat or prevent imminent or life-threatening deterioration.  Critical care was time spent personally by me on the following activities: development of treatment plan with patient and/or surrogate as well as nursing, discussions with consultants, evaluation of patient's response to treatment, examination of patient, obtaining history from patient or surrogate, ordering and performing treatments and interventions, ordering and review of laboratory studies, ordering and review of radiographic studies, pulse oximetry and re-evaluation of patient's condition.   1:23 AM Patient discussed with Dr. Johney Maine, who will come and consult.  Plan for admission to critical care.  1:31 AM Discussed with critical care, who will admit the patient.  Medications  vancomycin (VANCOCIN) IVPB 1000 mg/200 mL premix (not administered)  iohexol (OMNIPAQUE) 300 MG/ML solution 100 mL (not administered)  insulin regular (NOVOLIN R,HUMULIN R) 250 Units in sodium chloride 0.9 % 250 mL (1 Units/mL) infusion (not administered)  0.9 %  sodium chloride infusion (not administered)    Followed by  0.9 %  sodium chloride infusion (not administered)    Followed by  0.9 %  sodium chloride infusion (not administered)  dextrose 5 %-0.45 % sodium chloride  infusion (not administered)  piperacillin-tazobactam (ZOSYN) IVPB 3.375 g (not administered)  clindamycin (CLEOCIN) IVPB 900 mg (not administered)  sodium chloride 0.9 % bolus 1,000 mL (1,000 mLs Intravenous New Bag/Given 05/04/14 2324)  morphine 4 MG/ML injection 4 mg (4 mg Intravenous Given  05/04/14 2324)  iohexol (OMNIPAQUE) 300 MG/ML solution 100 mL (100 mLs Intravenous Contrast Given 05/05/14 0046)         Montine Circle, PA-C 05/05/14 0150

## 2014-05-05 NOTE — ED Provider Notes (Signed)
Medical screening examination/treatment/procedure(s) were conducted as a shared visit with non-physician practitioner(s) and myself.  I personally evaluated the patient during the encounter.   EKG Interpretation None       PT comes in with pain and redness to buttock. Pt diabetic, non compliant. Labs show that he is DKA, hydration and insulin drip started. Pt on exam of his butt appears to have some induration, ? Crepitus vs fluctuance, medially, with [place] being equivocal for large abscess pocket. CT orders, and confirms necrotizing infection.  Surgery called. ICU to admit. Broad spectrum antibiotics started.  CRITICAL CARE Performed by: Varney Biles   Total critical care time: 35 minutes  Critical care time was exclusive of separately billable procedures and treating other patients.  Critical care was necessary to treat or prevent imminent or life-threatening deterioration.  Critical care was time spent personally by me on the following activities: development of treatment plan with patient and/or surrogate as well as nursing, discussions with consultants, evaluation of patient's response to treatment, examination of patient, obtaining history from patient or surrogate, ordering and performing treatments and interventions, ordering and review of laboratory studies, ordering and review of radiographic studies, pulse oximetry and re-evaluation of patient's condition.   EMERGENCY DEPARTMENT US SOFT TISSUE INTERPRETATION "Study: Limited Soft Tissue Ultrasound"  INDICATIONS: Pain and Soft tissue infection Multiple views of the body part were obtained in real-time with a multi-frequency linear probe PERFORMED BY:  Myself IMAGES ARCHIVED?: Yes SIDE:Right  BODY PART:Other soft tisse (comment in note) - GLUTEAL FINDINGS: Cellulitis present and Other with some fat stranding and debri INTERPRETATION:  Abcess present and Cellulitis present     Varney Biles, MD 05/05/14 7034

## 2014-05-05 NOTE — Progress Notes (Signed)
I have seen and examined the pt and agree with PA-Jenning's progress note.

## 2014-05-05 NOTE — Progress Notes (Signed)
Day of Surgery  Subjective: He looks pretty good this Am, better than you would expect.  BP 130/80, HR in the 100's, sats 97%.  Alert and not really complaining. Dressing is soaked and we will change ABD.  Objective: Vital signs in last 24 hours: Temp:  [98.1 F (36.7 C)-99 F (37.2 C)] 99 F (37.2 C) (09/18 0600) Pulse Rate:  [102-115] 102 (09/18 0600) Resp:  [16-28] 24 (09/18 0600) BP: (129-140)/(71-85) 136/71 mmHg (09/18 0600) SpO2:  [95 %-100 %] 98 % (09/18 0600) Weight:  [89.4 kg (197 lb 1.5 oz)-95.255 kg (210 lb)] 89.4 kg (197 lb 1.5 oz) (09/18 0245) Last BM Date: 04/30/14 Good urine output Afebrile, HR and RR are up some AM BMP stable Intake/Output from previous day: 09/17 0701 - 09/18 0700 In: 3154.7 [I.V.:3004.7; IV Piggyback:150] Out: 2050 [Urine:2050] Intake/Output this shift:    General appearance: alert, cooperative and no distress Resp: clear to auscultation bilaterally Cardio: regular rate and rhythm, S1, S2 normal, no murmur, click, rub or gallop and sinus tachycardia on telem Skin: Dressing is soaked thru.  We will change ABD, but leave packing in for 24 hours.  Lab Results:   Recent Labs  05/04/14 2326 05/05/14 0124 05/05/14 0515  WBC 13.6*  --  13.9*  HGB 15.1 16.0 12.8*  HCT 43.3 47.0 37.8*  PLT 175  --  169  169    BMET  Recent Labs  05/04/14 2326 05/05/14 0124 05/05/14 0515  NA 130* 131* 135*  K 4.5 4.3 3.9  CL 95* 103 103  CO2 10*  --  16*  GLUCOSE 307* 285* 189*  BUN 18 17 13   CREATININE 0.62 0.60 0.52  CALCIUM 9.6  --  8.1*   PT/INR  Recent Labs  05/05/14 0515  LABPROT 16.1*  INR 1.29    No results found for this basename: AST, ALT, ALKPHOS, BILITOT, PROT, ALBUMIN,  in the last 168 hours   Lipase  No results found for this basename: lipase     Studies/Results: Ct Pelvis W Contrast  05/05/2014   ADDENDUM REPORT: 05/05/2014 02:20  ADDENDUM: Necrotizing fasciitis at the perineal and genital region is commonly defined  as Fournier gangrene. There is no evidence of gluteal or fascial involvement at this time.   Electronically Signed   By: Garald Balding M.D.   On: 05/05/2014 02:20   05/05/2014   CLINICAL DATA:  Evaluate for left buttock abscess. Left buttock erythema and induration. Leukocytosis.  EXAM: CT PELVIS WITH CONTRAST  TECHNIQUE: Multidetector CT imaging of the pelvis was performed using the standard protocol following the bolus administration of intravenous contrast.  CONTRAST:  167m OMNIPAQUE IOHEXOL 300 MG/ML  SOLN  COMPARISON:  Abdominal radiograph performed 07/10/2012  FINDINGS: Significant focal soft tissue air is noted at the medial left buttock, compatible with infection from a gas producing organism. This raises concern for necrotizing fasciitis. No fluid collection is seen to suggest a drainable abscess. Diffuse surrounding soft tissue inflammation extends laterally along the left buttock and inferiorly along the perineal soft tissues.  Mild soft tissue inflammation extends superiorly to the level of the coccyx, though there is no evidence of osseous involvement at this time. No acute osseous abnormalities are seen. There is a calcified disc protrusion at L4-L5, without evidence of impression on exiting nerve roots at this level.  Visualized small and large bowel loops are grossly unremarkable in appearance. The bladder is mildly distended and grossly unremarkable. The prostate remains normal in size. No inguinal  lymphadenopathy is seen.  Visualized vasculature is grossly unremarkable in appearance. Scattered vascular calcifications are seen within the pelvis.  IMPRESSION: 1. Significant focal soft tissue air at the medial left buttock, compatible with infection from a gas producing organism. This raises concern for necrotizing fasciitis. No drainable abscess seen at this time. 2. Diffuse soft tissue inflammation extends laterally along the left buttock and inferiorly along the perineal soft tissues, with  minimal soft tissue inflammation extending superiorly to the level of the coccyx. No evidence of osseous involvement.  These results were called by telephone at the time of interpretation on 05/05/2014 at 1:08 am to Dr. Varney Biles, who verbally acknowledged these results.  Electronically Signed: By: Garald Balding M.D. On: 05/05/2014 01:13   Dg Chest Port 1 View  05/05/2014   CLINICAL DATA:  Abscess.  Diabetic.  EXAM: PORTABLE CHEST - 1 VIEW  COMPARISON:  07/10/2012  FINDINGS: The film is made with shallow lung inflation. Heart size is normal. Lungs are clear.  IMPRESSION: No evidence for acute  abnormality.   Electronically Signed   By: Shon Hale M.D.   On: 05/05/2014 01:33    Medications: . heparin  5,000 Units Subcutaneous 3 times per day  . insulin aspart  1-3 Units Subcutaneous 6 times per day  . lip balm  1 application Topical BID  . living well with diabetes book   Does not apply Once  . piperacillin-tazobactam (ZOSYN)  IV  3.375 g Intravenous 3 times per day  . vancomycin  1,500 mg Intravenous Q12H  . vitamin C  500 mg Oral BID   Continuous Infusions: . sodium chloride    . sodium chloride Stopped (05/05/14 9191)  . sodium chloride Stopped (05/05/14 6606)  . dextrose 5 % and 0.45% NaCl    . dextrose 5 % and 0.45% NaCl 125 mL/hr at 05/05/14 0608  . insulin (NOVOLIN-R) infusion 2.3 Units/hr (05/05/14 0600)   PRN Meds:.sodium chloride, acetaminophen, acetaminophen, alum & mag hydroxide-simeth, bisacodyl, dextrose, diphenhydrAMINE, fentaNYL, HYDROmorphone (DILAUDID) injection, lactated ringers, magic mouthwash, meperidine (DEMEROL) injection, ondansetron (ZOFRAN) IV, ondansetron (ZOFRAN) IV, ondansetron, oxyCODONE, oxyCODONE, oxyCODONE, promethazine   Prior to Admission medications   Medication Sig Start Date End Date Taking? Authorizing Provider  Aspirin-Acetaminophen-Caffeine (GOODY HEADACHE PO) Take 1 Package by mouth daily as needed (headache).   Yes Historical Provider, MD     Assessment/Plan 1. FOURNIER'S GANGRENE INTO PELVIS/early sepsis  IRRIGATION AND DEBRIDEMENT FOURNIER'S GANGRENE TO PELVIC  PRESACRAL SPACE, 05/05/2014, Michael Boston, MD  2.  DKA/AODM 3.  CAD s/p NSTEMI  06/2011 4.  Hypertension 5.  Hx of noncompliance 6.  Dyslipidemia    Plan:  Change dressing in the AM,  Continue medical management.  He is on Zosyn and Vancomycin.  Nothing on cultures so far he finished at 4 AM.    LOS: 1 day    Derek Lowery 05/05/2014

## 2014-05-05 NOTE — Progress Notes (Signed)
ANTIBIOTIC CONSULT NOTE - follow up  Pharmacy Consult for Vancomycin and Zosyn  Indication: Cellulitis, abscess  No Known Allergies  Patient Measurements: Height: 5' 10"  (177.8 cm) Weight: 197 lb 1.5 oz (89.4 kg) IBW/kg (Calculated) : 73 Adjusted Body Weight:   Vital Signs: Temp: 99 F (37.2 C) (09/18 0600) Temp src: Oral (09/18 0600) BP: 130/80 mmHg (09/18 0731) Pulse Rate: 95 (09/18 0800) Intake/Output from previous day: 09/17 0701 - 09/18 0700 In: 3154.7 [I.V.:3004.7; IV Piggyback:150] Out: 2050 [Urine:2050] Intake/Output from this shift: Total I/O In: 53.9 [I.V.:3.9; IV Piggyback:50] Out: -   Labs:  Recent Labs  05/04/14 2326 05/05/14 0124 05/05/14 0515 05/05/14 0647  WBC 13.6*  --  13.9*  --   HGB 15.1 16.0 12.8*  --   PLT 175  --  169  169  --   CREATININE 0.62 0.60 0.52 0.50   Estimated Creatinine Clearance: 116.1 ml/min (by C-G formula based on Cr of 0.5). No results found for this basename: VANCOTROUGH, Corlis Leak, VANCORANDOM, Stewart, GENTPEAK, GENTRANDOM, TOBRATROUGH, TOBRAPEAK, TOBRARND, AMIKACINPEAK, AMIKACINTROU, AMIKACIN,  in the last 72 hours   Microbiology: Recent Results (from the past 720 hour(s))  MRSA PCR SCREENING     Status: None   Collection Time    05/05/14  3:26 AM      Result Value Ref Range Status   MRSA by PCR NEGATIVE  NEGATIVE Final   Comment:            The GeneXpert MRSA Assay (FDA     approved for NASAL specimens     only), is one component of a     comprehensive MRSA colonization     surveillance program. It is not     intended to diagnose MRSA     infection nor to guide or     monitor treatment for     MRSA infections.    Medical History: Past Medical History  Diagnosis Date  . Hypercholesteremia   . Type 2 diabetes mellitus 1990  . Internal hemorrhoids   . Essential hypertension, benign   . Noncompliance   . CAD (coronary artery disease)     NSTEMI 06/2011:  LHC 07/18/11: Proximal diagonal 60%, distal LAD  with a diabetic appearance and 60% stenosis, OM2 with an occluded superior branch and an inferior branch with 90%, EF 55% with inferior hypokinesis.  PCI: Promus DES to the OM2 inferior branch.  This vessel provides collaterals to the superior branch which remained occluded.  Echocardiogram 07/18/11: EF 60%, normal wall motion.  . Glaucoma     Medications:  Anti-infectives   Start     Dose/Rate Route Frequency Ordered Stop   05/05/14 1200  vancomycin (VANCOCIN) 1,500 mg in sodium chloride 0.9 % 500 mL IVPB  Status:  Discontinued     1,500 mg 250 mL/hr over 120 Minutes Intravenous Every 12 hours 05/05/14 0628 05/05/14 0844   05/05/14 1200  vancomycin (VANCOCIN) IVPB 1000 mg/200 mL premix     1,000 mg 200 mL/hr over 60 Minutes Intravenous Every 8 hours 05/05/14 0844     05/05/14 0800  piperacillin-tazobactam (ZOSYN) IVPB 3.375 g     3.375 g 12.5 mL/hr over 240 Minutes Intravenous 3 times per day 05/05/14 0628     05/05/14 0115  piperacillin-tazobactam (ZOSYN) IVPB 3.375 g     3.375 g 100 mL/hr over 30 Minutes Intravenous  Once 05/05/14 0112 05/05/14 0314   05/05/14 0115  clindamycin (CLEOCIN) IVPB 900 mg     900 mg  100 mL/hr over 30 Minutes Intravenous  Once 05/05/14 0115 05/05/14 0219   05/05/14 0000  vancomycin (VANCOCIN) IVPB 1000 mg/200 mL premix     1,000 mg 200 mL/hr over 60 Minutes Intravenous  Once 05/04/14 2345 05/05/14 0340     Assessment: 58 yo male with DM2 presented to ER on 9/18 with buttock sore x 1 week and found to have Fournier's gangrene/abscess. Pt also found to be in DKA. Emergent irrigation and debridement for fournier's gangrene into pelvic presacral space performed AM 9/18 and started on vanc and Zosyn per pharmacy dosing  9/18 >> vancomycin >> 9/18 >> Zosyn >>   Afebrile  WBC 13.9  SCr 0.5, CrCl > 100  Cultures: 9/18 blood: 9/18 urine:  9/18 abscess/wound:  Goal of Therapy:  Vancomycin trough level 15-20 mcg/ml (due to abscess) Zosyn based on  renal function   Plan:  1) Due to severity of infection (fournier's gangrene), will adjust vancomycin from 1542m IV q12 to 1g IV q8 2) Continue current dose of Zosyn 3) Will check a trough at steady state   JAdrian Saran PharmD, BCPS Pager [phone]/18/2015 8:46 AM

## 2014-05-05 NOTE — Progress Notes (Signed)
Inpatient Diabetes Program Recommendations  AACE/ADA: New Consensus Statement on Inpatient Glycemic Control (2013)  Target Ranges:  Prepandial:   less than 140 mg/dL      Peak postprandial:   less than 180 mg/dL (1-2 hours)      Critically ill patients:  140 - 180 mg/dL   Reason for Visit: DKA  Diabetes history: DM2 Outpatient Diabetes medications: None Current orders for Inpatient glycemic control: IV insulin per DKA orders  Pt states he's been on insulin and metformin in the past, but no meds in the last few years. Does not have PCP. No meter, but occasionally uses sister's meter. Had GI issues with metformin. Weight is stable, had lost approx 50 pounds several years ago. Discussed glycemic control and importance of controlling blood sugars at home. Will need PCP to manage DM.   Results for GERMAN, MANKE (MRN [id number]) as of 05/05/2014 13:51  Ref. Range 05/05/2014 08:55  Sodium Latest Range: 137-147 mEq/L 136 (L)  Potassium Latest Range: 3.7-5.3 mEq/L 3.7  Chloride Latest Range: 96-112 mEq/L 104  CO2 Latest Range: 19-32 mEq/L 19  BUN Latest Range: 6-23 mg/dL 10  Creatinine Latest Range: 0.50-1.35 mg/dL 0.51  Calcium Latest Range: 8.4-10.5 mg/dL 8.2 (L)  GFR calc non Af Amer Latest Range: >90 mL/min >90  GFR calc Af Amer Latest Range: >90 mL/min >90  Glucose Latest Range: 70-99 mg/dL 148 (H)  Anion gap Latest Range: 5-15  13  Results for CHARAN, PRIETO (MRN [id number]) as of 05/05/2014 13:51  Ref. Range 05/05/2014 02:40 05/05/2014 03:49 05/05/2014 04:50 05/05/2014 08:02 05/05/2014 09:08  Glucose-Capillary Latest Range: 70-99 mg/dL 236 (H) 155 (H) 173 (H) 191 (H) 153 (H)    Inpatient Diabetes Program Recommendations Insulin - IV drip/GlucoStabilizer: GlucoStabilizer - transition when CO2 >20 and AG < 12. Give Lantus 1-2 hours prior to discontinuation of insulin drip Insulin - Basal: Lantus 20 units QD - Give first dose 1-2 hours prior to discontinuation of insulin drip Correction  (SSI): Novolog moderate Q4 x 12 hours, then tidwc and hs HgbA1C: Check HgbA1C to assess glycemic control prior to hospitalization Diet: When advanced CHO mod med  Note: Case management discussing [place] program.  Recommend affordable insulin for discharge such as Humulin 70/30 which can be purchased at [facility] for $24.88.  Will continue to follow while inpatient. Thank you. Lorenda Peck, RD, LDN, CDE Inpatient Diabetes Coordinator [phone]

## 2014-05-05 NOTE — H&P (Signed)
PULMONARY / CRITICAL CARE MEDICINE   Name: Derek Lowery MRN: [id number] DOB: Oct 14, 1955    ADMISSION DATE:  05/04/2014 CONSULTATION DATE:  05/04/2014  REFERRING MD :  EDP  CHIEF COMPLAINT:  Buttock sore  INITIAL PRESENTATION: 58 y/o male with DM2 presented with a buttock sore for one week, found to have Fournier's gangrene.  STUDIES:  9/17 CT ab/pelv> Fournier's gangrene  SIGNIFICANT EVENTS: Surgical I&D of L inner buttock abscess 05/05/14  HISTORY OF PRESENT ILLNESS:  58 y/o male with DM2 and CAD was admitted from the [facility] ED on 9/17 after noting a sore on his left buttock for one week prior to admission. The area became more tender and painful so he came to the ED for further evaluation.  He denied fever, but he noted chills and fatigue.  He was also noted to be in DKA.  He says that he has had trouble with adequate blood sugar control recently.  He denied nausea, vomiting, or abdominal pain.  No cough or dyspnea.  He was seen in the ED by surgery and they felt he needed emergent I&D.  SUBJECTIVE:  Feels much better Remains on insulin gtt Hemodynamically stable  VITAL SIGNS: Temp:  [98.1 F (36.7 C)-99 F (37.2 C)] 98.2 F (36.8 C) (09/18 0800) Pulse Rate:  [95-115] 97 (09/18 1000) Resp:  [16-28] 19 (09/18 1000) BP: (129-140)/(64-85) 132/64 mmHg (09/18 1000) SpO2:  [95 %-100 %] 98 % (09/18 1000) Weight:  [89.4 kg (197 lb 1.5 oz)-95.255 kg (210 lb)] 89.4 kg (197 lb 1.5 oz) (09/18 0245) HEMODYNAMICS:   VENTILATOR SETTINGS:   INTAKE / OUTPUT:  Intake/Output Summary (Last 24 hours) at 05/05/14 1041 Last data filed at 05/05/14 1000  Gross per 24 hour  Intake 3212.3 ml  Output   2050 ml  Net 1162.3 ml    PHYSICAL EXAMINATION:  Gen: good spirits, no acute distress HEENT: NCAT, PERRL, EOMi, OP dry PULM: CTA B CV: Tachy, no mgr, no JVD AB: BS+, soft, nontender, no hsm Ext: warm, dry, no edema, no clubbing, no cyanosis Derm: L buttock wound dressed Neuro: A&Ox4,  MAEW   LABS:  CBC  Recent Labs Lab 05/04/14 2326 05/05/14 0124 05/05/14 0515  WBC 13.6*  --  13.9*  HGB 15.1 16.0 12.8*  HCT 43.3 47.0 37.8*  PLT 175  --  169  169   Coag's  Recent Labs Lab 05/05/14 0515  APTT 34  INR 1.29   BMET  Recent Labs Lab 05/05/14 0515 05/05/14 0647 05/05/14 0855  NA 135* 135* 136*  K 3.9 3.9 3.7  CL 103 103 104  CO2 16* 16* 19  BUN 13 11 10   CREATININE 0.52 0.50 0.51  GLUCOSE 189* 169* 148*   Electrolytes  Recent Labs Lab 05/05/14 0515 05/05/14 0647 05/05/14 0855  CALCIUM 8.1* 8.1* 8.2*  MG 1.6  --   --   PHOS 2.2*  --   --    Sepsis Markers  Recent Labs Lab 05/05/14 0003 05/05/14 0515  LATICACIDVEN 1.21 1.5  PROCALCITON  --  0.14   ABG No results found for this basename: PHART, PCO2ART, PO2ART,  in the last 168 hours Liver Enzymes No results found for this basename: AST, ALT, ALKPHOS, BILITOT, ALBUMIN,  in the last 168 hours Cardiac Enzymes No results found for this basename: TROPONINI, PROBNP,  in the last 168 hours Glucose  Recent Labs Lab 05/05/14 0131 05/05/14 0240 05/05/14 0349 05/05/14 0450 05/05/14 0802 05/05/14 0908  GLUCAP 301* 236* 155*  173* 191* 153*    Imaging No results found.   ASSESSMENT / PLAN:  PULMONARY OETT N/A A: stable, no current issues P:   - post-op pulm hygiene  CARDIOVASCULAR CVL N/A A: Sinus tachycardia from sepsis, improved post-op CAD without clear signs of ischemia Hypertension P:  - tele - monitor BP closely - not currently on any home meds   RENAL A:  Acute metabolic acidosis from DKA P:   -follow DKA protocol, plan transition insulin gtt to off after AG < 12 and CO2 > 20  GASTROINTESTINAL A:  No acute issues P:   -npo until gap closes and OK by surgery  HEMATOLOGIC A:  No acute issues P:  -monitor for bleeding -transfusion per usual ICU guidelines  INFECTIOUS A:  Fournier's gangrene P:   Emergent incision and drainage now BCx2 9/18  >> Abx: vanc , start date 9/18, day 1/? Abx: zosyn , start date 9/18, day 1/?  ENDOCRINE A:  DKA P:   -follow DKA protocol, plan transition insulin gtt to off after AG < 12 and CO2 > 20 -pt reports has been unable to get his insulin (or any meds) at home due to cost > will need to involve social work and DM coordinator  NEUROLOGIC A:  NO acute issues P:   Pain control post op per surgery  TODAY'S SUMMARY: 58 y/o male with DKA and Fournier's gangrene.  Improved s/p emergent I&D, DKA protocol.  I have personally obtained a history, examined the patient, evaluated laboratory and imaging results, formulated the assessment and plan and placed orders.   Baltazar Apo, MD, PhD 05/05/2014, 10:47 AM [facility] [phone] or if no answer [phone]

## 2014-05-05 NOTE — H&P (Signed)
PULMONARY / CRITICAL CARE MEDICINE   Name: Derek Lowery MRN: [id number] DOB: 03-20-1956    ADMISSION DATE:  05/04/2014 CONSULTATION DATE:  05/04/2014  REFERRING MD :  EDP  CHIEF COMPLAINT:  Buttock sore  INITIAL PRESENTATION: 58 y/o male with DM2 presented with a buttock sore for one week, found to have Fournier's gangrene.  STUDIES:  9/17 CT ab/pelv> Fournier's gangrene  SIGNIFICANT EVENTS:   HISTORY OF PRESENT ILLNESS:  58 y/o male with DM2 and CAD was admitted from the [facility] ED on 9/17 after noting a sore on his left buttock for one week prior to admission. The area became more tender and painful so he came to the ED for further evaluation.  He denied fever, but he noted chills and fatigue.  He was also noted to be in DKA.  He says that he has had trouble with adequate blood sugar control recently.  He denied nausea, vomiting, or abdominal pain.  No cough or dyspnea.  He was seen in the ED by surgery and they felt he needed emergent I&D.  PAST MEDICAL HISTORY :  Past Medical History  Diagnosis Date  . Hypercholesteremia   . Type 2 diabetes mellitus 1990  . Internal hemorrhoids   . Essential hypertension, benign   . Noncompliance   . CAD (coronary artery disease)     NSTEMI 06/2011:  LHC 07/18/11: Proximal diagonal 60%, distal LAD with a diabetic appearance and 60% stenosis, OM2 with an occluded superior branch and an inferior branch with 90%, EF 55% with inferior hypokinesis.  PCI: Promus DES to the OM2 inferior branch.  This vessel provides collaterals to the superior branch which remained occluded.  Echocardiogram 07/18/11: EF 60%, normal wall motion.  Marland Kitchen Glaucoma    Past Surgical History  Procedure Laterality Date  . Colonoscopy  2000    Dr. Collene Mares   Prior to Admission medications   Medication Sig Start Date End Date Taking? Authorizing Provider  Aspirin-Acetaminophen-Caffeine (GOODY HEADACHE PO) Take 1 Package by mouth daily as needed (headache).   Yes Historical  Provider, MD   No Known Allergies  FAMILY HISTORY:  Family History  Problem Relation Age of Onset  . Coronary artery disease Father     Developed in his 62s  . Kidney disease Father   . Diabetes Father   . Heart failure Mother   . Hypertension Mother   . Diabetes Sister   . Diabetes Brother   . Melanoma Father    SOCIAL HISTORY:  reports that he has never smoked. He has never used smokeless tobacco. He reports that he does not drink alcohol or use illicit drugs.  REVIEW OF SYSTEMS:   Gen: Denies fever, +chills, weight change, + fatigue, night sweats HEENT: Denies blurred vision, double vision, hearing loss, tinnitus, sinus congestion, rhinorrhea, sore throat, neck stiffness, dysphagia PULM: Denies shortness of breath, cough, sputum production, hemoptysis, wheezing CV: Denies chest pain, edema, orthopnea, paroxysmal nocturnal dyspnea, palpitations GI: Denies abdominal pain, nausea, vomiting, diarrhea, hematochezia, melena, constipation, change in bowel habits GU: Denies dysuria, hematuria,+ polyuria, oliguria, urethral discharge Endocrine: Denies hot or cold intolerance, polyuria, polyphagia or appetite change Derm: per HPI Heme: Denies easy bruising, bleeding, bleeding gums Neuro: Denies headache, numbness, weakness, slurred speech, loss of memory or consciousness   SUBJECTIVE:   VITAL SIGNS: Temp:  [98.5 F (36.9 C)] 98.5 F (36.9 C) (09/17 2030) Pulse Rate:  [114-115] 114 (09/18 0130) Resp:  [16-20] 18 (09/18 0130) BP: (131-139)/(73-85) 139/84 mmHg (09/18 0130) SpO2:  [  96 %-99 %] 96 % (09/18 0130) Weight:  [95.255 kg (210 lb)] 95.255 kg (210 lb) (09/17 2357) HEMODYNAMICS:   VENTILATOR SETTINGS:   INTAKE / OUTPUT: No intake or output data in the 24 hours ending 05/05/14 0242  PHYSICAL EXAMINATION:  Gen: good spirits, tachypnea, no acute distress HEENT: NCAT, PERRL, EOMi, OP profoundly dry PULM: CTA B CV: Tachy, no mgr, no JVD AB: BS+, soft, nontender, no  hsm Ext: warm, dry, no edema, no clubbing, no cyanosis Derm: left buttock redness extending from anus, no drainage  Neuro: A&Ox4, MAEW   LABS:  CBC  Recent Labs Lab 05/04/14 2326 05/05/14 0124  WBC 13.6*  --   HGB 15.1 16.0  HCT 43.3 47.0  PLT 175  --    Coag's No results found for this basename: APTT, INR,  in the last 168 hours BMET  Recent Labs Lab 05/04/14 2326 05/05/14 0124  NA 130* 131*  K 4.5 4.3  CL 95* 103  CO2 10*  --   BUN 18 17  CREATININE 0.62 0.60  GLUCOSE 307* 285*   Electrolytes  Recent Labs Lab 05/04/14 2326  CALCIUM 9.6   Sepsis Markers  Recent Labs Lab 05/05/14 0003  LATICACIDVEN 1.21   ABG No results found for this basename: PHART, PCO2ART, PO2ART,  in the last 168 hours Liver Enzymes No results found for this basename: AST, ALT, ALKPHOS, BILITOT, ALBUMIN,  in the last 168 hours Cardiac Enzymes No results found for this basename: TROPONINI, PROBNP,  in the last 168 hours Glucose  Recent Labs Lab 05/05/14 0006 05/05/14 0131  GLUCAP 274* 301*    Imaging No results found.   ASSESSMENT / PLAN:  PULMONARY OETT N/A A: Anticipate he will return from OR intubated P:   - will place vent orders post OR  CARDIOVASCULAR CVL N/A A: Sinus tachycardia from sepsis CAD without clear signs of ischemia Hypertension P:  - obtain 12 lead now - emergent surgery indicated now - tele - monitor BP closely  RENAL A:  Acute metabolic acidosis from DKA P:   -follow DKA protocol  GASTROINTESTINAL A:  No acute issues P:   -npo until gap closes and OK by surgery  HEMATOLOGIC A:  No acute issues P:  -monitor for bleeding -transfusion per usual ICU guidelines  INFECTIOUS A:  Fournier's gangrene P:   Emergent incision and drainage now BCx2 9/18 >> Abx: vanc , start date 9/18, day 1/? Abx: zosyn , start date 9/18, day 1/?  ENDOCRINE A:  DKA P:   -follow DKA protocol  NEUROLOGIC A:  NO acute issues P:   Pain  control post op per surgery  TODAY'S SUMMARY: 58 y/o male with DKA and Fournier's gangrene.  Emergent I&D now, DKA protocol.  I have personally obtained a history, examined the patient, evaluated laboratory and imaging results, formulated the assessment and plan and placed orders. CRITICAL CARE: The patient is critically ill with multiple organ systems failure and requires high complexity decision making for assessment and support, frequent evaluation and titration of therapies, application of advanced monitoring technologies and extensive interpretation of multiple databases. Critical Care Time devoted to patient care services described in this note is 35 minutes.   We recommend that you use the Milta Deiters Med rinse twice a day as needed for your nasal congestion.  Make sure that you use distilled water for this.    05/05/2014, 2:42 AM

## 2014-05-05 NOTE — ED Notes (Signed)
CO2 10 critical result called from lab.

## 2014-05-06 DIAGNOSIS — E119 Type 2 diabetes mellitus without complications: Secondary | ICD-10-CM

## 2014-05-06 DIAGNOSIS — Z9119 Patient's noncompliance with other medical treatment and regimen: Secondary | ICD-10-CM

## 2014-05-06 DIAGNOSIS — Z91199 Patient's noncompliance with other medical treatment and regimen due to unspecified reason: Secondary | ICD-10-CM

## 2014-05-06 DIAGNOSIS — A419 Sepsis, unspecified organism: Secondary | ICD-10-CM

## 2014-05-06 DIAGNOSIS — E1110 Type 2 diabetes mellitus with ketoacidosis without coma: Secondary | ICD-10-CM | POA: Diagnosis present

## 2014-05-06 LAB — GLUCOSE, CAPILLARY
GLUCOSE-CAPILLARY: 191 mg/dL — AB (ref 70–99)
GLUCOSE-CAPILLARY: 164 mg/dL — AB (ref 70–99)
GLUCOSE-CAPILLARY: 172 mg/dL — AB (ref 70–99)
GLUCOSE-CAPILLARY: 117 mg/dL — AB (ref 70–99)
GLUCOSE-CAPILLARY: 189 mg/dL — AB (ref 70–99)
GLUCOSE-CAPILLARY: 191 mg/dL — AB (ref 70–99)
Glucose-Capillary: 145 mg/dL — ABNORMAL HIGH (ref 70–99)
Glucose-Capillary: 158 mg/dL — ABNORMAL HIGH (ref 70–99)
Glucose-Capillary: 117 mg/dL — ABNORMAL HIGH (ref 70–99)
Glucose-Capillary: 142 mg/dL — ABNORMAL HIGH (ref 70–99)
Glucose-Capillary: 171 mg/dL — ABNORMAL HIGH (ref 70–99)
Glucose-Capillary: 180 mg/dL — ABNORMAL HIGH (ref 70–99)
Glucose-Capillary: 185 mg/dL — ABNORMAL HIGH (ref 70–99)
Glucose-Capillary: 190 mg/dL — ABNORMAL HIGH (ref 70–99)
Glucose-Capillary: 231 mg/dL — ABNORMAL HIGH (ref 70–99)

## 2014-05-06 LAB — BASIC METABOLIC PANEL
Anion gap: 11 (ref 5–15)
Anion gap: 11 (ref 5–15)
BUN: 6 mg/dL (ref 6–23)
BUN: 6 mg/dL (ref 6–23)
CHLORIDE: 100 meq/L (ref 96–112)
CHLORIDE: 100 meq/L (ref 96–112)
CO2: 21 mEq/L (ref 19–32)
CO2: 23 mEq/L (ref 19–32)
CREATININE: 0.52 mg/dL (ref 0.50–1.35)
CREATININE: 0.53 mg/dL (ref 0.50–1.35)
Calcium: 8.1 mg/dL — ABNORMAL LOW (ref 8.4–10.5)
Calcium: 8.3 mg/dL — ABNORMAL LOW (ref 8.4–10.5)
GFR calc non Af Amer: >90 mL/min (ref >90)
GFR calc non Af Amer: >90 mL/min (ref >90)
GLUCOSE: 181 mg/dL — AB (ref 70–99)
Glucose, Bld: 129 mg/dL — ABNORMAL HIGH (ref 70–99)
Potassium: 3.1 mEq/L — ABNORMAL LOW (ref 3.7–5.3)
Potassium: 3.4 mEq/L — ABNORMAL LOW (ref 3.7–5.3)
Sodium: 132 mEq/L — ABNORMAL LOW (ref 137–147)
Sodium: 134 mEq/L — ABNORMAL LOW (ref 137–147)

## 2014-05-06 LAB — CBC
HCT: 36.2 % — ABNORMAL LOW (ref 39.0–52.0)
Hemoglobin: 12.4 g/dL — ABNORMAL LOW (ref 13.0–17.0)
MCH: 28.1 pg (ref 26.0–34.0)
MCHC: 34.3 g/dL (ref 30.0–36.0)
MCV: 82.1 fL (ref 78.0–100.0)
Platelets: 164 10*3/uL (ref 150–400)
RBC: 4.41 MIL/uL (ref 4.22–5.81)
RDW: 12.6 % (ref 11.5–15.5)
WBC: 9.9 10*3/uL (ref 4.0–10.5)

## 2014-05-06 LAB — URINE CULTURE
COLONY COUNT: NO GROWTH
Culture: NO GROWTH

## 2014-05-06 LAB — VANCOMYCIN, TROUGH: VANCOMYCIN TR: 13.1 ug/mL (ref 10.0–20.0)

## 2014-05-06 MED ORDER — INSULIN GLARGINE 100 UNIT/ML ~~LOC~~ SOLN
20.0000 [IU] | Freq: Every day | SUBCUTANEOUS | Status: DC
  Administered 2014-05-06 – 2014-05-08 (×3): 20 [IU] via SUBCUTANEOUS
  Filled 2014-05-06 (×4): qty 0.2

## 2014-05-06 MED ORDER — INSULIN ASPART 100 UNIT/ML ~~LOC~~ SOLN
0.0000 [IU] | SUBCUTANEOUS | Status: DC
  Administered 2014-05-06 (×3): 3 [IU] via SUBCUTANEOUS
  Administered 2014-05-06: 5 [IU] via SUBCUTANEOUS
  Administered 2014-05-06 – 2014-05-07 (×2): 3 [IU] via SUBCUTANEOUS
  Administered 2014-05-07: 5 [IU] via SUBCUTANEOUS
  Administered 2014-05-07: 3 [IU] via SUBCUTANEOUS
  Administered 2014-05-07: 5 [IU] via SUBCUTANEOUS

## 2014-05-06 MED ORDER — POTASSIUM CHLORIDE 10 MEQ/100ML IV SOLN
10.0000 meq | INTRAVENOUS | Status: AC
  Administered 2014-05-06 (×4): 10 meq via INTRAVENOUS
  Filled 2014-05-06 (×4): qty 100

## 2014-05-06 NOTE — Progress Notes (Signed)
PULMONARY / CRITICAL CARE MEDICINE   Name: Derek Lowery MRN: [id number] DOB: 06-08-1956    ADMISSION DATE:  05/04/2014 CONSULTATION DATE:  05/04/2014  REFERRING MD :  EDP  CHIEF COMPLAINT:  Buttock sore  INITIAL PRESENTATION: 58 y/o male with DM2 presented with a buttock sore for one week, found to have Fournier's gangrene.  STUDIES:  9/17 CT ab/pelv> Fournier's gangrene  SIGNIFICANT EVENTS: Surgical I&D of L inner buttock abscess 05/05/14  HISTORY OF PRESENT ILLNESS:  58 y/o male with DM2 and CAD was admitted from the [facility] ED on 9/17 after noting a sore on his left buttock for one week prior to admission. The area became more tender and painful so he came to the ED for further evaluation.  He denied fever, but he noted chills and fatigue.  He was also noted to be in DKA.  He says that he has had trouble with adequate blood sugar control recently.  He denied nausea, vomiting, or abdominal pain.  No cough or dyspnea.  He was seen in the ED by surgery and they felt he needed emergent I&D.  SUBJECTIVE:  Improved   VITAL SIGNS: Temp:  [97.9 F (36.6 C)-99.1 F (37.3 C)] 98.4 F (36.9 C) (09/19 0000) Pulse Rate:  [88-103] 90 (09/19 0600) Resp:  [18-26] 18 (09/19 0600) BP: (118-141)/(59-71) 139/69 mmHg (09/19 0600) SpO2:  [92 %-99 %] 97 % (09/19 0600)       INTAKE / OUTPUT:  Intake/Output Summary (Last 24 hours) at 05/06/14 0800 Last data filed at 05/06/14 0641  Gross per 24 hour  Intake 3625.52 ml  Output   2515 ml  Net 1110.52 ml    PHYSICAL EXAMINATION:  Gen: good spirits, no acute distress HEENT: NCAT, PERRL, EOMi, OP dry PULM: CTA B CV: RRR  no mgr, no JVD AB: BS+, soft, nontender, no hsm Ext: warm, dry, no edema, no clubbing, no cyanosis Derm: L buttock wound dressed Neuro: A&Ox4, MAEW   LABS:  CBC  Recent Labs Lab 05/04/14 2326 05/05/14 0124 05/05/14 0515 05/06/14 0511  WBC 13.6*  --  13.9* 9.9  HGB 15.1 16.0 12.8* 12.4*  HCT 43.3 47.0 37.8*  36.2*  PLT 175  --  169  169 164   Coag's  Recent Labs Lab 05/05/14 0515  APTT 34  INR 1.29   BMET  Recent Labs Lab 05/05/14 2047 05/06/14 0054 05/06/14 0511  NA 134* 132* 134*  K 3.4* 3.1* 3.4*  CL 101 100 100  CO2 19 21 23   BUN 7 6 6   CREATININE 0.52 0.52 0.53  GLUCOSE 172* 129* 181*   Electrolytes  Recent Labs Lab 05/05/14 0515  05/05/14 2047 05/06/14 0054 05/06/14 0511  CALCIUM 8.1*  < > 8.2* 8.1* 8.3*  MG 1.6  --   --   --   --   PHOS 2.2*  --   --   --   --   < > = values in this interval not displayed. Sepsis Markers  Recent Labs Lab 05/05/14 0003 05/05/14 0515  LATICACIDVEN 1.21 1.5  PROCALCITON  --  0.14   ABG No results found for this basename: PHART, PCO2ART, PO2ART,  in the last 168 hours Liver Enzymes No results found for this basename: AST, ALT, ALKPHOS, BILITOT, ALBUMIN,  in the last 168 hours Cardiac Enzymes No results found for this basename: TROPONINI, PROBNP,  in the last 168 hours Glucose  Recent Labs Lab 05/06/14 0112 05/06/14 0212 05/06/14 0319 05/06/14 0354 05/06/14 0814  05/06/14 0626  GLUCAP 117* 117* 145* 171* 180* 190*    Imaging Ct Pelvis W Contrast  05/05/2014   ADDENDUM REPORT: 05/05/2014 02:20  ADDENDUM: Necrotizing fasciitis at the perineal and genital region is commonly defined as Fournier gangrene. There is no evidence of gluteal or fascial involvement at this time.   Electronically Signed   By: Garald Balding M.D.   On: 05/05/2014 02:20   05/05/2014   CLINICAL DATA:  Evaluate for left buttock abscess. Left buttock erythema and induration. Leukocytosis.  EXAM: CT PELVIS WITH CONTRAST  TECHNIQUE: Multidetector CT imaging of the pelvis was performed using the standard protocol following the bolus administration of intravenous contrast.  CONTRAST:  17m OMNIPAQUE IOHEXOL 300 MG/ML  SOLN  COMPARISON:  Abdominal radiograph performed 07/10/2012  FINDINGS: Significant focal soft tissue air is noted at the medial left  buttock, compatible with infection from a gas producing organism. This raises concern for necrotizing fasciitis. No fluid collection is seen to suggest a drainable abscess. Diffuse surrounding soft tissue inflammation extends laterally along the left buttock and inferiorly along the perineal soft tissues.  Mild soft tissue inflammation extends superiorly to the level of the coccyx, though there is no evidence of osseous involvement at this time. No acute osseous abnormalities are seen. There is a calcified disc protrusion at L4-L5, without evidence of impression on exiting nerve roots at this level.  Visualized small and large bowel loops are grossly unremarkable in appearance. The bladder is mildly distended and grossly unremarkable. The prostate remains normal in size. No inguinal lymphadenopathy is seen.  Visualized vasculature is grossly unremarkable in appearance. Scattered vascular calcifications are seen within the pelvis.  IMPRESSION: 1. Significant focal soft tissue air at the medial left buttock, compatible with infection from a gas producing organism. This raises concern for necrotizing fasciitis. No drainable abscess seen at this time. 2. Diffuse soft tissue inflammation extends laterally along the left buttock and inferiorly along the perineal soft tissues, with minimal soft tissue inflammation extending superiorly to the level of the coccyx. No evidence of osseous involvement.  These results were called by telephone at the time of interpretation on 05/05/2014 at 1:08 am to Dr. AVarney Biles who verbally acknowledged these results.  Electronically Signed: By: JGarald BaldingM.D. On: 05/05/2014 01:13   Dg Chest Port 1 View  05/05/2014   CLINICAL DATA:  Abscess.  Diabetic.  EXAM: PORTABLE CHEST - 1 VIEW  COMPARISON:  07/10/2012  FINDINGS: The film is made with shallow lung inflation. Heart size is normal. Lungs are clear.  IMPRESSION: No evidence for acute  abnormality.   Electronically Signed   By:  BShon HaleM.D.   On: 05/05/2014 01:33     ASSESSMENT / PLAN:  PULMONARY A: stable, no current issues P:   - post-op pulm hygiene  CARDIOVASCULAR CVL N/A A: Sinus tachycardia from sepsis, REsolved CAD without clear signs of ischemia Hypertension P:  - tele - monitor BP closely - not currently on any home meds   RENAL A:  Acute metabolic acidosis from DKA resolved  P:   -monitor  GASTROINTESTINAL A:  No acute issues P:   -advance diet  HEMATOLOGIC A:  No acute issues P:  -monitor for bleeding -transfusion per usual ICU guidelines  INFECTIOUS A:  Fournier's gangrene improved per surgery P:    BCx2 9/18 >> Abx: vanc , start date 9/18, day 2/? Abx: zosyn , start date 9/18, day 2/?  ENDOCRINE A:  DKA  Resolved,  gap closed  P:   -SQ SSI plus lantus, insulin drip d/c -pt reports has been unable to get his insulin (or any meds) at home due to cost > will need to involve social work and DM coordinator  NEUROLOGIC A:  NO acute issues P:   Pain control post op per surgery  TODAY'S SUMMARY: 58 y/o male with DKA and Fournier's gangrene.  Improved s/p emergent I&D, DKA protocol. Gap closed.  Tfr to [facility] and PCCM will be OFF. Tfr to SDU status  I have personally obtained a history, examined the patient, evaluated laboratory and imaging results, formulated the assessment and plan and placed orders.   Mariel Sleet Beeper  [phone]  Cell  [phone]  If no response or cell goes to voicemail, call beeper [phone]   05/06/2014, 8:00 AM

## 2014-05-06 NOTE — Progress Notes (Signed)
ANTIBIOTIC CONSULT NOTE - follow up  Pharmacy Consult for Vancomycin and Zosyn  Indication: Cellulitis, abscess  No Known Allergies  Patient Measurements: Height: 5' 10"  (177.8 cm) Weight: 197 lb 1.5 oz (89.4 kg) IBW/kg (Calculated) : 73 Adjusted Body Weight:   Vital Signs: Temp: 97.5 F (36.4 C) (09/19 1200) Temp src: Oral (09/19 1200) BP: 173/86 mmHg (09/19 1800) Pulse Rate: 95 (09/19 1800) Intake/Output from previous day: 09/18 0701 - 09/19 0700 In: 3875.5 [P.O.:960; I.V.:1765.5; IV Piggyback:1050] Out: 2805 [Urine:2805] Intake/Output from this shift:    Labs:  Recent Labs  05/04/14 2326 05/05/14 0124 05/05/14 0515  05/05/14 2047 05/06/14 0054 05/06/14 0511  WBC 13.6*  --  13.9*  --   --   --  9.9  HGB 15.1 16.0 12.8*  --   --   --  12.4*  PLT 175  --  169  169  --   --   --  164  CREATININE 0.62 0.60 0.52  < > 0.52 0.52 0.53  < > = values in this interval not displayed. Estimated Creatinine Clearance: 116.1 ml/min (by C-G formula based on Cr of 0.53).  Recent Labs  05/06/14 1900  VANCOTROUGH 13.1     Microbiology: Recent Results (from the past 720 hour(s))  CULTURE, BLOOD (ROUTINE X 2)     Status: None   Collection Time    05/05/14 12:05 AM      Result Value Ref Range Status   Specimen Description BLOOD LEFT ANTECUBITAL   Final   Special Requests BOTTLES DRAWN AEROBIC AND ANAEROBIC 3CC   Final   Culture  Setup Time     Final   Value: 05/05/2014 05:35     Performed at [facility]   Culture     Final   Value:        BLOOD CULTURE RECEIVED NO GROWTH TO DATE CULTURE WILL BE HELD FOR 5 DAYS BEFORE ISSUING A FINAL NEGATIVE REPORT     Performed at [facility]   Report Status PENDING   Incomplete  CULTURE, BLOOD (ROUTINE X 2)     Status: None   Collection Time    05/05/14 12:15 AM      Result Value Ref Range Status   Specimen Description BLOOD RIGHT HAND   Final   Special Requests BOTTLES DRAWN AEROBIC AND ANAEROBIC 3CC   Final    Culture  Setup Time     Final   Value: 05/05/2014 05:36     Performed at [facility]   Culture     Final   Value:        BLOOD CULTURE RECEIVED NO GROWTH TO DATE CULTURE WILL BE HELD FOR 5 DAYS BEFORE ISSUING A FINAL NEGATIVE REPORT     Performed at [facility]   Report Status PENDING   Incomplete  MRSA PCR SCREENING     Status: None   Collection Time    05/05/14  3:26 AM      Result Value Ref Range Status   MRSA by PCR NEGATIVE  NEGATIVE Final   Comment:            The GeneXpert MRSA Assay (FDA     approved for NASAL specimens     only), is one component of a     comprehensive MRSA colonization     surveillance program. It is not     intended to diagnose MRSA     infection nor to guide or  monitor treatment for     MRSA infections.  ANAEROBIC CULTURE     Status: None   Collection Time    05/05/14  3:59 AM      Result Value Ref Range Status   Specimen Description BUTTOCKS LEFT   Final   Special Requests NONE   Final   Gram Stain     Final   Value: FEW WBC PRESENT,BOTH PMN AND MONONUCLEAR     NO SQUAMOUS EPITHELIAL CELLS SEEN     ABUNDANT GRAM NEGATIVE RODS     FEW GRAM POSITIVE COCCI     IN PAIRS IN CLUSTERS   Culture     Final   Value: NO ANAEROBES ISOLATED; CULTURE IN PROGRESS FOR 5 DAYS     Performed at [facility]   Report Status PENDING   Incomplete  CULTURE, ROUTINE-ABSCESS     Status: None   Collection Time    05/05/14  3:59 AM      Result Value Ref Range Status   Specimen Description BUTTOCKS LEFT   Final   Special Requests NONE   Final   Gram Stain     Final   Value: FEW WBC PRESENT,BOTH PMN AND MONONUCLEAR     NO SQUAMOUS EPITHELIAL CELLS SEEN     ABUNDANT GRAM NEGATIVE RODS     FEW GRAM POSITIVE COCCI     IN PAIRS IN CLUSTERS   Culture     Final   Value: NO GROWTH 1 DAY     Performed at [facility]   Report Status PENDING   Incomplete  URINE CULTURE     Status: None   Collection Time    05/05/14  6:30 AM       Result Value Ref Range Status   Specimen Description URINE, CLEAN CATCH   Final   Special Requests NONE   Final   Culture  Setup Time     Final   Value: 05/05/2014 09:50     Performed at [facility]     Final   Value: NO GROWTH     Performed at [facility]   Culture     Final   Value: NO GROWTH     Performed at [facility]   Report Status 05/06/2014 FINAL   Final    Medical History: Past Medical History  Diagnosis Date  . Hypercholesteremia   . Type 2 diabetes mellitus 1990  . Internal hemorrhoids   . Essential hypertension, benign   . Noncompliance   . CAD (coronary artery disease)     NSTEMI 06/2011:  LHC 07/18/11: Proximal diagonal 60%, distal LAD with a diabetic appearance and 60% stenosis, OM2 with an occluded superior branch and an inferior branch with 90%, EF 55% with inferior hypokinesis.  PCI: Promus DES to the OM2 inferior branch.  This vessel provides collaterals to the superior branch which remained occluded.  Echocardiogram 07/18/11: EF 60%, normal wall motion.  . Glaucoma     Medications:  Anti-infectives   Start     Dose/Rate Route Frequency Ordered Stop   05/05/14 1200  vancomycin (VANCOCIN) 1,500 mg in sodium chloride 0.9 % 500 mL IVPB  Status:  Discontinued     1,500 mg 250 mL/hr over 120 Minutes Intravenous Every 12 hours 05/05/14 0628 05/05/14 0844   05/05/14 1200  vancomycin (VANCOCIN) IVPB 1000 mg/200 mL premix     1,000 mg 200 mL/hr over 60 Minutes Intravenous Every 8  hours 05/05/14 0844     05/05/14 0800  piperacillin-tazobactam (ZOSYN) IVPB 3.375 g     3.375 g 12.5 mL/hr over 240 Minutes Intravenous 3 times per day 05/05/14 0628     05/05/14 0115  piperacillin-tazobactam (ZOSYN) IVPB 3.375 g     3.375 g 100 mL/hr over 30 Minutes Intravenous  Once 05/05/14 0112 05/05/14 0314   05/05/14 0115  clindamycin (CLEOCIN) IVPB 900 mg     900 mg 100 mL/hr over 30 Minutes Intravenous  Once 05/05/14 0115 05/05/14 0219    05/05/14 0000  vancomycin (VANCOCIN) IVPB 1000 mg/200 mL premix     1,000 mg 200 mL/hr over 60 Minutes Intravenous  Once 05/04/14 2345 05/05/14 0340     Assessment: 58 yo male with DM2 presented to ER on 9/18 with buttock sore x 1 week and found to have Fournier's gangrene/abscess. Pt also found to be in DKA. Emergent irrigation and debridement for fournier's gangrene into pelvic presacral space performed AM 9/18 and started on vanc and Zosyn per pharmacy dosing  9/18 >> vancomycin >> 9/18 >> Zosyn >>   Afebrile  WBC imroved  SCr stable, CrCl > 100  Cultures: 9/18 blood: ngtd 9/18 urine: ngtd 9/18 abscess/wound: ngtd  Dose changes/drug level info:  9/19 VT at 1900 =  13.61mg/ml on 1g q8 (prior to 6th dose)  Goal of Therapy:  Vancomycin trough level 15-20 mcg/ml (due to abscess) Zosyn based on renal function   Plan:  1) Continue Vancomycin 1g q8h as trough is acceptable for infection being treated  - suggest repeat trough in 72h if remains on vancomycin to rule out accumulation 2) Continue current dose of Zosyn  DDoreene Eland PharmD, BCPS.   Pager: [phone]/19/2015 7:46 PM

## 2014-05-06 NOTE — Progress Notes (Signed)
ANTIBIOTIC CONSULT NOTE - follow up  Pharmacy Consult for Vancomycin and Zosyn  Indication: Cellulitis, abscess  No Known Allergies  Patient Measurements: Height: 5' 10"  (177.8 cm) Weight: 197 lb 1.5 oz (89.4 kg) IBW/kg (Calculated) : 73 Adjusted Body Weight:   Vital Signs: Temp: 98.4 F (36.9 C) (09/19 0800) Temp src: Oral (09/19 0800) BP: 121/64 mmHg (09/19 1000) Pulse Rate: 87 (09/19 1000) Intake/Output from previous day: 09/18 0701 - 09/19 0700 In: 3875.5 [P.O.:960; I.V.:1765.5; IV Piggyback:1050] Out: 2805 [Urine:2805] Intake/Output from this shift: Total I/O In: 445 [I.V.:275; Other:20; IV Piggyback:150] Out: 675 [Urine:675]  Labs:  Recent Labs  05/04/14 2326 05/05/14 0124 05/05/14 0515  05/05/14 2047 05/06/14 0054 05/06/14 0511  WBC 13.6*  --  13.9*  --   --   --  9.9  HGB 15.1 16.0 12.8*  --   --   --  12.4*  PLT 175  --  169  169  --   --   --  164  CREATININE 0.62 0.60 0.52  < > 0.52 0.52 0.53  < > = values in this interval not displayed. Estimated Creatinine Clearance: 116.1 ml/min (by C-G formula based on Cr of 0.53). No results found for this basename: VANCOTROUGH, VANCOPEAK, VANCORANDOM, GENTTROUGH, GENTPEAK, GENTRANDOM, TOBRATROUGH, TOBRAPEAK, TOBRARND, AMIKACINPEAK, AMIKACINTROU, AMIKACIN,  in the last 72 hours   Microbiology: Recent Results (from the past 720 hour(s))  CULTURE, BLOOD (ROUTINE X 2)     Status: None   Collection Time    05/05/14 12:05 AM      Result Value Ref Range Status   Specimen Description BLOOD LEFT ANTECUBITAL   Final   Special Requests BOTTLES DRAWN AEROBIC AND ANAEROBIC 3CC   Final   Culture  Setup Time     Final   Value: 05/05/2014 05:35     Performed at [facility]   Culture     Final   Value:        BLOOD CULTURE RECEIVED NO GROWTH TO DATE CULTURE WILL BE HELD FOR 5 DAYS BEFORE ISSUING A FINAL NEGATIVE REPORT     Performed at [facility]   Report Status PENDING   Incomplete  CULTURE, BLOOD  (ROUTINE X 2)     Status: None   Collection Time    05/05/14 12:15 AM      Result Value Ref Range Status   Specimen Description BLOOD RIGHT HAND   Final   Special Requests BOTTLES DRAWN AEROBIC AND ANAEROBIC 3CC   Final   Culture  Setup Time     Final   Value: 05/05/2014 05:36     Performed at [facility]   Culture     Final   Value:        BLOOD CULTURE RECEIVED NO GROWTH TO DATE CULTURE WILL BE HELD FOR 5 DAYS BEFORE ISSUING A FINAL NEGATIVE REPORT     Performed at [facility]   Report Status PENDING   Incomplete  MRSA PCR SCREENING     Status: None   Collection Time    05/05/14  3:26 AM      Result Value Ref Range Status   MRSA by PCR NEGATIVE  NEGATIVE Final   Comment:            The GeneXpert MRSA Assay (FDA     approved for NASAL specimens     only), is one component of a     comprehensive MRSA colonization     surveillance  program. It is not     intended to diagnose MRSA     infection nor to guide or     monitor treatment for     MRSA infections.  ANAEROBIC CULTURE     Status: None   Collection Time    05/05/14  3:59 AM      Result Value Ref Range Status   Specimen Description BUTTOCKS LEFT   Final   Special Requests NONE   Final   Gram Stain     Final   Value: FEW WBC PRESENT,BOTH PMN AND MONONUCLEAR     NO SQUAMOUS EPITHELIAL CELLS SEEN     ABUNDANT GRAM NEGATIVE RODS     FEW GRAM POSITIVE COCCI     IN PAIRS IN CLUSTERS   Culture     Final   Value: NO ANAEROBES ISOLATED; CULTURE IN PROGRESS FOR 5 DAYS     Performed at [facility]   Report Status PENDING   Incomplete  CULTURE, ROUTINE-ABSCESS     Status: None   Collection Time    05/05/14  3:59 AM      Result Value Ref Range Status   Specimen Description BUTTOCKS LEFT   Final   Special Requests NONE   Final   Gram Stain     Final   Value: FEW WBC PRESENT,BOTH PMN AND MONONUCLEAR     NO SQUAMOUS EPITHELIAL CELLS SEEN     ABUNDANT GRAM NEGATIVE RODS     FEW GRAM POSITIVE COCCI      IN PAIRS IN CLUSTERS   Culture     Final   Value: NO GROWTH 1 DAY     Performed at [facility]   Report Status PENDING   Incomplete    Medical History: Past Medical History  Diagnosis Date  . Hypercholesteremia   . Type 2 diabetes mellitus 1990  . Internal hemorrhoids   . Essential hypertension, benign   . Noncompliance   . CAD (coronary artery disease)     NSTEMI 06/2011:  LHC 07/18/11: Proximal diagonal 60%, distal LAD with a diabetic appearance and 60% stenosis, OM2 with an occluded superior branch and an inferior branch with 90%, EF 55% with inferior hypokinesis.  PCI: Promus DES to the OM2 inferior branch.  This vessel provides collaterals to the superior branch which remained occluded.  Echocardiogram 07/18/11: EF 60%, normal wall motion.  . Glaucoma     Medications:  Anti-infectives   Start     Dose/Rate Route Frequency Ordered Stop   05/05/14 1200  vancomycin (VANCOCIN) 1,500 mg in sodium chloride 0.9 % 500 mL IVPB  Status:  Discontinued     1,500 mg 250 mL/hr over 120 Minutes Intravenous Every 12 hours 05/05/14 0628 05/05/14 0844   05/05/14 1200  vancomycin (VANCOCIN) IVPB 1000 mg/200 mL premix     1,000 mg 200 mL/hr over 60 Minutes Intravenous Every 8 hours 05/05/14 0844     05/05/14 0800  piperacillin-tazobactam (ZOSYN) IVPB 3.375 g     3.375 g 12.5 mL/hr over 240 Minutes Intravenous 3 times per day 05/05/14 0628     05/05/14 0115  piperacillin-tazobactam (ZOSYN) IVPB 3.375 g     3.375 g 100 mL/hr over 30 Minutes Intravenous  Once 05/05/14 0112 05/05/14 0314   05/05/14 0115  clindamycin (CLEOCIN) IVPB 900 mg     900 mg 100 mL/hr over 30 Minutes Intravenous  Once 05/05/14 0115 05/05/14 0219   05/05/14 0000  vancomycin (VANCOCIN) IVPB 1000 mg/200 mL  premix     1,000 mg 200 mL/hr over 60 Minutes Intravenous  Once 05/04/14 2345 05/05/14 0340     Assessment: 58 yo male with DM2 presented to ER on 9/18 with buttock sore x 1 week and found to have Fournier's  gangrene/abscess. Pt also found to be in DKA. Emergent irrigation and debridement for fournier's gangrene into pelvic presacral space performed AM 9/18 and started on vanc and Zosyn per pharmacy dosing  9/18 >> vancomycin >> 9/18 >> Zosyn >>   Afebrile  WBC imroved  SCr stable, CrCl > 100  Cultures: 9/18 blood: ngtd 9/18 urine: ngtd 9/18 abscess/wound: ngtd  Goal of Therapy:  Vancomycin trough level 15-20 mcg/ml (due to abscess) Zosyn based on renal function   Plan:  1) Continue Vancomycin 1g q8 for now and check a trough prior to next dose 2) Continue current dose of Zosyn   Adrian Saran, PharmD, BCPS Pager [phone] 05/06/2014 12:58 PM

## 2014-05-06 NOTE — Progress Notes (Signed)
General Surgery Note  LOS: 2 days  POD -  1 Day Post-Op  Assessment/Plan: 1.  Fournier's gangrene  IRRIGATION AND DEBRIDEMENT ABSCESS left buttock - 05/05/14 - Gross  WBD - 9,900 - 05/06/2014  Zosyn/Vanc  Wound looks okay - will start BID dressing changes.  The wound may need to be opened more widely.  I think I can do that at the bed side.  Will address tomorrow AM.  2.  Diabetes 3.  DVT prophylaxis - SQ Heparin 4.  CAD s/p NSTEMI 06/2011  5.  Hypertension 6.  Disability x 1 year - has no PCP   Principal Problem:   Fournier's gangrene into true pelvis s/p I&D 05/05/2014 Active Problems:   Sepsis affecting skin   Noncompliance with diabetes treatment   Subjective:  Doing okay.  Not too sore.  By self in room.  He lives with his sister. Objective:   Filed Vitals:   05/06/14 0600  BP: 139/69  Pulse: 90  Temp:   Resp: 18     Intake/Output from previous day:  09/18 0701 - 09/19 0700 In: 3750.5 [P.O.:960; I.V.:1640.5; IV Piggyback:1050] Out: 2805 [Urine:2805]  Intake/Output this shift:      Physical Exam:   General: Bearded WM who is alert and oriented.    HEENT: Normal. Pupils equal. .   Lungs: Clear   Abdomen: Soft   Wound: Left perianal/buttocks wound is relatively clean.  May need to open skin more to help with packing and management.   Lab Results:    Recent Labs  05/05/14 0515 05/06/14 0511  WBC 13.9* 9.9  HGB 12.8* 12.4*  HCT 37.8* 36.2*  PLT 169  169 164    BMET   Recent Labs  05/06/14 0054 05/06/14 0511  NA 132* 134*  K 3.1* 3.4*  CL 100 100  CO2 21 23  GLUCOSE 129* 181*  BUN 6 6  CREATININE 0.52 0.53  CALCIUM 8.1* 8.3*    PT/INR   Recent Labs  05/05/14 0515  LABPROT 16.1*  INR 1.29    ABG   Recent Labs  05/05/14 0110  HCO3 14.2*     Studies/Results:  Ct Pelvis W Contrast  05/05/2014   ADDENDUM REPORT: 05/05/2014 02:20  ADDENDUM: Necrotizing fasciitis at the perineal and genital region is commonly defined as Fournier  gangrene. There is no evidence of gluteal or fascial involvement at this time.   Electronically Signed   By: Garald Balding M.D.   On: 05/05/2014 02:20   05/05/2014   CLINICAL DATA:  Evaluate for left buttock abscess. Left buttock erythema and induration. Leukocytosis.  EXAM: CT PELVIS WITH CONTRAST  TECHNIQUE: Multidetector CT imaging of the pelvis was performed using the standard protocol following the bolus administration of intravenous contrast.  CONTRAST:  156m OMNIPAQUE IOHEXOL 300 MG/ML  SOLN  COMPARISON:  Abdominal radiograph performed 07/10/2012  FINDINGS: Significant focal soft tissue air is noted at the medial left buttock, compatible with infection from a gas producing organism. This raises concern for necrotizing fasciitis. No fluid collection is seen to suggest a drainable abscess. Diffuse surrounding soft tissue inflammation extends laterally along the left buttock and inferiorly along the perineal soft tissues.  Mild soft tissue inflammation extends superiorly to the level of the coccyx, though there is no evidence of osseous involvement at this time. No acute osseous abnormalities are seen. There is a calcified disc protrusion at L4-L5, without evidence of impression on exiting nerve roots at this level.  Visualized small and  large bowel loops are grossly unremarkable in appearance. The bladder is mildly distended and grossly unremarkable. The prostate remains normal in size. No inguinal lymphadenopathy is seen.  Visualized vasculature is grossly unremarkable in appearance. Scattered vascular calcifications are seen within the pelvis.  IMPRESSION: 1. Significant focal soft tissue air at the medial left buttock, compatible with infection from a gas producing organism. This raises concern for necrotizing fasciitis. No drainable abscess seen at this time. 2. Diffuse soft tissue inflammation extends laterally along the left buttock and inferiorly along the perineal soft tissues, with minimal soft  tissue inflammation extending superiorly to the level of the coccyx. No evidence of osseous involvement.  These results were called by telephone at the time of interpretation on 05/05/2014 at 1:08 am to Dr. Varney Biles, who verbally acknowledged these results.  Electronically Signed: By: Garald Balding M.D. On: 05/05/2014 01:13   Dg Chest Port 1 View  05/05/2014   CLINICAL DATA:  Abscess.  Diabetic.  EXAM: PORTABLE CHEST - 1 VIEW  COMPARISON:  07/10/2012  FINDINGS: The film is made with shallow lung inflation. Heart size is normal. Lungs are clear.  IMPRESSION: No evidence for acute  abnormality.   Electronically Signed   By: Shon Hale M.D.   On: 05/05/2014 01:33     Anti-infectives:   Anti-infectives   Start     Dose/Rate Route Frequency Ordered Stop   05/05/14 1200  vancomycin (VANCOCIN) 1,500 mg in sodium chloride 0.9 % 500 mL IVPB  Status:  Discontinued     1,500 mg 250 mL/hr over 120 Minutes Intravenous Every 12 hours 05/05/14 0628 05/05/14 0844   05/05/14 1200  vancomycin (VANCOCIN) IVPB 1000 mg/200 mL premix     1,000 mg 200 mL/hr over 60 Minutes Intravenous Every 8 hours 05/05/14 0844     05/05/14 0800  piperacillin-tazobactam (ZOSYN) IVPB 3.375 g     3.375 g 12.5 mL/hr over 240 Minutes Intravenous 3 times per day 05/05/14 0628     05/05/14 0115  piperacillin-tazobactam (ZOSYN) IVPB 3.375 g     3.375 g 100 mL/hr over 30 Minutes Intravenous  Once 05/05/14 0112 05/05/14 0314   05/05/14 0115  clindamycin (CLEOCIN) IVPB 900 mg     900 mg 100 mL/hr over 30 Minutes Intravenous  Once 05/05/14 0115 05/05/14 0219   05/05/14 0000  vancomycin (VANCOCIN) IVPB 1000 mg/200 mL premix     1,000 mg 200 mL/hr over 60 Minutes Intravenous  Once 05/04/14 2345 05/05/14 0340      Alphonsa Overall, MD, FACS Pager: [place] Office: [phone] 05/06/2014

## 2014-05-06 NOTE — Progress Notes (Signed)
eLink Physician-Brief Progress Note Patient Name: Derek Lowery DOB: [birth year] MRN: [id number]   Date of Service  05/06/2014  HPI/Events of Note  Gap closed  eICU Interventions  [place] insulin orders placed      Intervention Category Intermediate Interventions: Hyperglycemia - evaluation and treatment  GIDDINGS, OLIVIA K. 05/06/2014, 3:31 AM

## 2014-05-07 DIAGNOSIS — I10 Essential (primary) hypertension: Secondary | ICD-10-CM

## 2014-05-07 LAB — CBC WITH DIFFERENTIAL/PLATELET
BASOS ABS: 0 10*3/uL (ref 0.0–0.1)
BASOS PCT: 0 % (ref 0–1)
Eosinophils Absolute: 0.1 10*3/uL (ref 0.0–0.7)
Eosinophils Relative: 1 % (ref 0–5)
HEMATOCRIT: 36 % — AB (ref 39.0–52.0)
Hemoglobin: 12.3 g/dL — ABNORMAL LOW (ref 13.0–17.0)
Lymphocytes Relative: 12 % (ref 12–46)
Lymphs Abs: 1.1 10*3/uL (ref 0.7–4.0)
MCH: 27.5 pg (ref 26.0–34.0)
MCHC: 34.2 g/dL (ref 30.0–36.0)
MCV: 80.4 fL (ref 78.0–100.0)
MONO ABS: 0.9 10*3/uL (ref 0.1–1.0)
MONOS PCT: 11 % (ref 3–12)
Neutro Abs: 6.4 10*3/uL (ref 1.7–7.7)
Neutrophils Relative %: 76 % (ref 43–77)
Platelets: 207 10*3/uL (ref 150–400)
RBC: 4.48 MIL/uL (ref 4.22–5.81)
RDW: 12.4 % (ref 11.5–15.5)
WBC: 8.5 10*3/uL (ref 4.0–10.5)

## 2014-05-07 LAB — BASIC METABOLIC PANEL
Anion gap: 11 (ref 5–15)
BUN: 6 mg/dL (ref 6–23)
CO2: 25 meq/L (ref 19–32)
CREATININE: 0.53 mg/dL (ref 0.50–1.35)
Calcium: 8.4 mg/dL (ref 8.4–10.5)
Chloride: 100 mEq/L (ref 96–112)
GFR calc Af Amer: >90 mL/min (ref >90)
GFR calc non Af Amer: >90 mL/min (ref >90)
GLUCOSE: 204 mg/dL — AB (ref 70–99)
Potassium: 3.7 mEq/L (ref 3.7–5.3)
Sodium: 136 mEq/L — ABNORMAL LOW (ref 137–147)

## 2014-05-07 LAB — GLUCOSE, CAPILLARY
GLUCOSE-CAPILLARY: 228 mg/dL — AB (ref 70–99)
GLUCOSE-CAPILLARY: 167 mg/dL — AB (ref 70–99)
Glucose-Capillary: 165 mg/dL — ABNORMAL HIGH (ref 70–99)
Glucose-Capillary: 205 mg/dL — ABNORMAL HIGH (ref 70–99)
Glucose-Capillary: 189 mg/dL — ABNORMAL HIGH (ref 70–99)
Glucose-Capillary: 162 mg/dL — ABNORMAL HIGH (ref 70–99)
Glucose-Capillary: 190 mg/dL — ABNORMAL HIGH (ref 70–99)

## 2014-05-07 LAB — HEMOGLOBIN A1C
Hgb A1c MFr Bld: 11.9 % — ABNORMAL HIGH (ref <5.7)
MEAN PLASMA GLUCOSE: 295 mg/dL — AB (ref <117)

## 2014-05-07 MED ORDER — INSULIN ASPART 100 UNIT/ML ~~LOC~~ SOLN: 0.0000 [IU] | Freq: Every day | SUBCUTANEOUS | Status: DC

## 2014-05-07 MED ORDER — ACETAMINOPHEN 325 MG PO TABS: 650.0000 mg | ORAL_TABLET | Freq: Four times a day (QID) | ORAL | Status: DC | PRN

## 2014-05-07 MED ORDER — INSULIN ASPART 100 UNIT/ML ~~LOC~~ SOLN
0.0000 [IU] | Freq: Three times a day (TID) | SUBCUTANEOUS | Status: DC
  Administered 2014-05-07 – 2014-05-08 (×3): 3 [IU] via SUBCUTANEOUS
  Administered 2014-05-08: 2 [IU] via SUBCUTANEOUS
  Administered 2014-05-09: 3 [IU] via SUBCUTANEOUS

## 2014-05-07 MED ORDER — OXYCODONE HCL 5 MG PO TABS
5.0000 mg | ORAL_TABLET | ORAL | Status: DC | PRN
Start: 2014-05-07 — End: 2014-05-10
  Administered 2014-05-08 – 2014-05-09 (×3): 5 mg via ORAL
  Filled 2014-05-07 (×3): qty 1

## 2014-05-07 MED ORDER — SODIUM CHLORIDE 0.9 % IV SOLN
INTRAVENOUS | Status: DC
  Administered 2014-05-07: 500 mL via INTRAVENOUS

## 2014-05-07 MED ORDER — HYDROMORPHONE HCL 1 MG/ML IJ SOLN: 1.0000 mg | INTRAMUSCULAR | Status: DC | PRN
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
## 2014-05-07 NOTE — Progress Notes (Signed)
PROGRESS NOTE    Derek Lowery GGY:[id number] DOB: [birth year] DOA: 05/04/2014 PCP: Salena Saner., MD  HPI/Brief narrative 58 year old male patient with history of long-standing DM 2/has not taken any medications for the last 2 years, hypercholesterolemia, essential hypertension, CAD/NSTEMI 06/2011, presented to hospital on 05/04/14 with the buttocks so for one week which was progressively worsening. CCM admitted him to ICU for DKA and Fournier's gangrene/perinal abscess. DKA resolved and patient is s/p I&D of left buttock abscess on 05/05/14. Care was transferred to [facility] on 9/20.   Assessment/Plan:  1. DKA in uncontrolled DM2; admitted to ICU and treated with aggressive IV fluids and DKA protocol. DKA resolved. 2. Uncontrolled DM 2: Patient states that he has not taken any medications for the last 2 years secondary to lack of insurance and financial difficulties. Check A1c. For now continue Lantus and SSI-adjust doses. Eventually we'll have to discharge on 70/30 insulin. Case management consult and diabetes coordinator consultation. CBGs fluctuating and mildly uncontrolled. 3. Fournier's gangrene/perinal abscess: Status post I&D 05/05/14. Management per surgery. Continue IV vancomycin and Zosyn. Consider removing Foley catheter 9/21. 4. Essential hypertension: Controlled off medications. 5. History of CAD/MI: Asymptomatic of chest pain. 6. Severe sepsis Sepsis, present on admission: Secondary to perianal abscess. Sepsis features resolved. Blood cultures x2 negative to date. Wound cultures x2: Negative to date. Urine culture x1 negative. Tachycardia improved/resolved. 7. Medication noncompliance/financial & insurance issues: Case management consultation. 8. Hypokalemia: Replaced 9. Hyperlipidemia:    Code Status: Full Family Communication: None at bedside. Disposition Plan: Transfer to medical floor.   Consultants:  General surgery  CCM-signed off  9/19  Procedures:  Incision and drainage of perianal abscess on 9/18  Foley catheter  Antibiotics:  IV Zosyn 9/17 >  IV vancomycin 9/17 >  IV clindamycin x1 dose   Subjective: Patient states that he feels much better. Has no specific complaints.  Objective: Filed Vitals:   05/07/14 0600 05/07/14 0755 05/07/14 0800 05/07/14 1000  BP: 114/61  124/66 124/69  Pulse: 82  79 73  Temp:  97.8 F (36.6 C)    TempSrc:  Oral    Resp: 24  19 20   Height:      Weight:      SpO2: 96%  96% 95%    Intake/Output Summary (Last 24 hours) at 05/07/14 1209 Last data filed at 05/07/14 1100  Gross per 24 hour  Intake   2060 ml  Output   3200 ml  Net  -1140 ml   Filed Weights   05/04/14 2357 05/05/14 0245 05/07/14 0402  Weight: 95.255 kg (210 lb) 89.4 kg (197 lb 1.5 oz) 92.4 kg (203 lb 11.3 oz)     Exam:  General exam: Pleasant middle-aged male lying comfortably in bed. Respiratory system: Clear. No increased work of breathing. Cardiovascular system: S1 & S2 heard, RRR. No JVD, murmurs, gallops, clicks or pedal edema. Telemetry: Sinus rhythm. Gastrointestinal system: Abdomen is nondistended, soft and nontender. Normal bowel sounds heard. Foley catheter +. Buttock/perianal wound dressing clean and dry. Central nervous system: Alert and oriented. No focal neurological deficits. Extremities: Symmetric 5 x 5 power.   Data Reviewed: Basic Metabolic Panel:  Recent Labs Lab 05/05/14 0124 05/05/14 0515  05/05/14 1616 05/05/14 2047 05/06/14 0054 05/06/14 0511 05/07/14 0341  NA 131* 135*  < > 132* 134* 132* 134* 136*  K 4.3 3.9  < > 3.5* 3.4* 3.1* 3.4* 3.7  CL 103 103  < > 100 101 100 100 100  CO2  --  16*  < > 18* 19 21 23 25   GLUCOSE 285* 189*  < > 197* 172* 129* 181* 204*  BUN 17 13  < > 8 7 6 6 6   CREATININE 0.60 0.52  < > 0.52 0.52 0.52 0.53 0.53  CALCIUM  --  8.1*  < > 8.0* 8.2* 8.1* 8.3* 8.4  MG  --  1.6  --   --   --   --   --   --   PHOS  --  2.2*  --   --   --   --    --   --   < > = values in this interval not displayed. Liver Function Tests: No results found for this basename: AST, ALT, ALKPHOS, BILITOT, PROT, ALBUMIN,  in the last 168 hours No results found for this basename: LIPASE, AMYLASE,  in the last 168 hours No results found for this basename: AMMONIA,  in the last 168 hours CBC:  Recent Labs Lab 05/04/14 2326 05/05/14 0124 05/05/14 0515 05/06/14 0511 05/07/14 0341  WBC 13.6*  --  13.9* 9.9 8.5  NEUTROABS 11.4*  --   --   --  6.4  HGB 15.1 16.0 12.8* 12.4* 12.3*  HCT 43.3 47.0 37.8* 36.2* 36.0*  MCV 80.8  --  82.4 82.1 80.4  PLT 175  --  169  169 164 207   Cardiac Enzymes: No results found for this basename: CKTOTAL, CKMB, CKMBINDEX, TROPONINI,  in the last 168 hours BNP (last 3 results) No results found for this basename: PROBNP,  in the last 8760 hours CBG:  Recent Labs Lab 05/06/14 0748 05/06/14 1231 05/06/14 1543 05/06/14 2005 05/07/14 0802  GLUCAP 189* 191* 231* 165* 189*    Recent Results (from the past 240 hour(s))  CULTURE, BLOOD (ROUTINE X 2)     Status: None   Collection Time    05/05/14 12:05 AM      Result Value Ref Range Status   Specimen Description BLOOD LEFT ANTECUBITAL   Final   Special Requests BOTTLES DRAWN AEROBIC AND ANAEROBIC 3CC   Final   Culture  Setup Time     Final   Value: 05/05/2014 05:35     Performed at [facility]   Culture     Final   Value:        BLOOD CULTURE RECEIVED NO GROWTH TO DATE CULTURE WILL BE HELD FOR 5 DAYS BEFORE ISSUING A FINAL NEGATIVE REPORT     Performed at [facility]   Report Status PENDING   Incomplete  CULTURE, BLOOD (ROUTINE X 2)     Status: None   Collection Time    05/05/14 12:15 AM      Result Value Ref Range Status   Specimen Description BLOOD RIGHT HAND   Final   Special Requests BOTTLES DRAWN AEROBIC AND ANAEROBIC 3CC   Final   Culture  Setup Time     Final   Value: 05/05/2014 05:36     Performed at [facility]   Culture      Final   Value:        BLOOD CULTURE RECEIVED NO GROWTH TO DATE CULTURE WILL BE HELD FOR 5 DAYS BEFORE ISSUING A FINAL NEGATIVE REPORT     Performed at [facility]   Report Status PENDING   Incomplete  MRSA PCR SCREENING     Status: None   Collection Time    05/05/14  3:26 AM  Result Value Ref Range Status   MRSA by PCR NEGATIVE  NEGATIVE Final   Comment:            The GeneXpert MRSA Assay (FDA     approved for NASAL specimens     only), is one component of a     comprehensive MRSA colonization     surveillance program. It is not     intended to diagnose MRSA     infection nor to guide or     monitor treatment for     MRSA infections.  ANAEROBIC CULTURE     Status: None   Collection Time    05/05/14  3:59 AM      Result Value Ref Range Status   Specimen Description BUTTOCKS LEFT   Final   Special Requests NONE   Final   Gram Stain     Final   Value: FEW WBC PRESENT,BOTH PMN AND MONONUCLEAR     NO SQUAMOUS EPITHELIAL CELLS SEEN     ABUNDANT GRAM NEGATIVE RODS     FEW GRAM POSITIVE COCCI     IN PAIRS IN CLUSTERS   Culture     Final   Value: NO ANAEROBES ISOLATED; CULTURE IN PROGRESS FOR 5 DAYS     Performed at [facility]   Report Status PENDING   Incomplete  CULTURE, ROUTINE-ABSCESS     Status: None   Collection Time    05/05/14  3:59 AM      Result Value Ref Range Status   Specimen Description BUTTOCKS LEFT   Final   Special Requests NONE   Final   Gram Stain     Final   Value: FEW WBC PRESENT,BOTH PMN AND MONONUCLEAR     NO SQUAMOUS EPITHELIAL CELLS SEEN     ABUNDANT GRAM NEGATIVE RODS     FEW GRAM POSITIVE COCCI     IN PAIRS IN CLUSTERS   Culture     Final   Value: NO GROWTH 2 DAYS     Performed at [facility]   Report Status PENDING   Incomplete  URINE CULTURE     Status: None   Collection Time    05/05/14  6:30 AM      Result Value Ref Range Status   Specimen Description URINE, CLEAN CATCH   Final   Special Requests NONE    Final   Culture  Setup Time     Final   Value: 05/05/2014 09:50     Performed at [facility]     Final   Value: NO GROWTH     Performed at [facility]   Culture     Final   Value: NO GROWTH     Performed at [facility]   Report Status 05/06/2014 FINAL   Final      Studies: No results found.      Scheduled Meds: . heparin  5,000 Units Subcutaneous 3 times per day  . insulin aspart  0-15 Units Subcutaneous 6 times per day  . insulin glargine  20 Units Subcutaneous Daily  . lip balm  1 application Topical BID  . living well with diabetes book   Does not apply Once  . piperacillin-tazobactam (ZOSYN)  IV  3.375 g Intravenous 3 times per day  . vancomycin  1,000 mg Intravenous Q8H  . vitamin C  500 mg Oral BID   Continuous Infusions: . dextrose 5 % and 0.45%  NaCl 50 mL/hr at 05/07/14 0120    Principal Problem:   DKA (diabetic ketoacidoses) Active Problems:   Poorly controlled type 2 diabetes mellitus   Tachycardia-multifactorial   Essential hypertension, benign   Severe sepsis without septic shock   Noncompliance with diabetes treatment   Fournier's gangrene into true pelvis s/p I&D 05/05/2014    Time spent: 94 minutes    Geno Sydnor, MD, FACP, FHM. [facility] Pager [phone]  If 7PM-7AM, please contact night-coverage [email or web address] Password [email or web address] 05/07/2014, 12:09 PM    LOS: 3 days

## 2014-05-08 ENCOUNTER — Encounter (HOSPITAL_COMMUNITY): Payer: Self-pay | Admitting: Surgery

## 2014-05-08 LAB — CULTURE, ROUTINE-ABSCESS

## 2014-05-08 LAB — CBC
HCT: 35.5 % — ABNORMAL LOW (ref 39.0–52.0)
Hemoglobin: 12.2 g/dL — ABNORMAL LOW (ref 13.0–17.0)
MCH: 28.3 pg (ref 26.0–34.0)
MCHC: 34.4 g/dL (ref 30.0–36.0)
MCV: 82.4 fL (ref 78.0–100.0)
PLATELETS: 212 10*3/uL (ref 150–400)
RBC: 4.31 MIL/uL (ref 4.22–5.81)
RDW: 12.5 % (ref 11.5–15.5)
WBC: 6.7 10*3/uL (ref 4.0–10.5)

## 2014-05-08 LAB — BASIC METABOLIC PANEL
ANION GAP: 9 (ref 5–15)
BUN: 5 mg/dL — ABNORMAL LOW (ref 6–23)
CALCIUM: 8.2 mg/dL — AB (ref 8.4–10.5)
CHLORIDE: 100 meq/L (ref 96–112)
CO2: 29 meq/L (ref 19–32)
CREATININE: 0.54 mg/dL (ref 0.50–1.35)
GFR calc Af Amer: >90 mL/min (ref >90)
GFR calc non Af Amer: >90 mL/min (ref >90)
Glucose, Bld: 190 mg/dL — ABNORMAL HIGH (ref 70–99)
Potassium: 3.4 mEq/L — ABNORMAL LOW (ref 3.7–5.3)
Sodium: 138 mEq/L (ref 137–147)

## 2014-05-08 LAB — GLUCOSE, CAPILLARY
Glucose-Capillary: 178 mg/dL — ABNORMAL HIGH (ref 70–99)
Glucose-Capillary: 141 mg/dL — ABNORMAL HIGH (ref 70–99)
Glucose-Capillary: 190 mg/dL — ABNORMAL HIGH (ref 70–99)

## 2014-05-08 MED ORDER — POTASSIUM CHLORIDE CRYS ER 20 MEQ PO TBCR
40.0000 meq | EXTENDED_RELEASE_TABLET | Freq: Once | ORAL | Status: AC
  Administered 2014-05-08: 40 meq via ORAL
  Filled 2014-05-08: qty 2

## 2014-05-08 NOTE — Progress Notes (Signed)
Nutrition Education Note  RD consulted for nutrition education regarding diabetes.   Lab Results  Component Value Date   HGBA1C 11.9* 05/07/2014    RD provided "Carbohydrate Counting for People with Diabetes" handout from the Academy of Nutrition and Dietetics. Discussed different food groups and their effects on blood sugar, emphasizing carbohydrate-containing foods. Provided list of carbohydrates and recommended serving sizes of common foods.  Discussed importance of controlled and consistent carbohydrate intake throughout the day. Provided examples of ways to balance meals/snacks and encouraged intake of high-fiber, whole grain complex carbohydrates. Teach back method used.  - Pt reports good appetite at home, eats 3 meals/day - Has oatmeal/egg with coffee for breakfast, bologna sandwich on white bread for lunch, and chicken/baked potato for dinner - Drinks diet Pepsi - Admits to snacking on a lot of candy - Discussed importance of portion of control of CHO, especially pt's potato at dinner, and discouraged pt from snacking on candy - diabetic friendly snacks discussed - Pt appeared motivated to eating healthier  Expect good compliance.  Body mass index is 28.98 kg/(m^2). Pt meets criteria for overweight based on current BMI.  Current diet order is CHO medium, patient is consuming approximately 75% of meals at this time. Labs and medications reviewed. No further nutrition interventions warranted at this time. RD contact information provided. If additional nutrition issues arise, please re-consult RD.  Carlis Stable MS, [place], LDN [phone] Pager [phone] Weekend/After Hours Pager

## 2014-05-08 NOTE — Care Management Note (Addendum)
    Page 1 of 2   05/10/2014     1:20:06 PM CARE MANAGEMENT NOTE 05/10/2014  Patient:  Derek Lowery, Derek Lowery   Account Number:  [id number]  Date Initiated:  05/05/2014  Documentation initiated by:  Sunday Spillers  Subjective/Objective Assessment:   58 yo male admitted with DKA and Fournier's gangrene. PTA lived at home.     Action/Plan:   Home when stable   Anticipated DC Date:  05/10/2014   Anticipated DC Plan:  [place]  CM consult      Choice offered to / List presented to:  C-1 Patient        [place] arranged  HH-1 RN      [place].   Status of service:  Completed, signed off Medicare Important Message given?   (If response is "NO", the following Medicare IM given date fields will be blank) Date Medicare IM given:   Medicare IM given by:   Date Additional Medicare IM given:   Additional Medicare IM given by:    Discharge Disposition:  [place]  Per UR Regulation:  Reviewed for med. necessity/level of care/duration of stay  If discussed at [place] of Stay Meetings, dates discussed:   05/09/2014    Comments:  05/10/14 Tiani Stanbery RN,BSN NCM [phone] PATIENT W/1X FREE MEDICINES 2 [facility] TEL#[phone]-SPOKE TO [place]-1X FREE HELP W/MEDICINES EXCLUDING OXYCODONE,TYLENOL,& VIT C.[place].PATIENT INFORMED TO GO DIRECTLY TO [facility] AFTER D/C FROM HOSPITAL WITH SCRIPTS.PATIENT VOICED UNDERSTANDING.NURSE DAVID AWARE.[facility] AWARE OF HHRN ORDER.  05/08/14 Chevi Lim RN,BSN NCM [phone] [facility] FOR HHRN.AWAIT FINALL [place].[facility]PAT[facility] TO CALL FOR PCP APPT.PATIENT VOICED UNDERSTANDING.PROVIDED W/[employer]-HEALTH INSURANCE.ONCE APPT SET CAN GET MEDS @ [place].

## 2014-05-08 NOTE — Progress Notes (Signed)
Patient ID: Derek Lowery, male   DOB: 1956/05/16, 58 y.o.   MRN: [id number]     [facility]      [address] [place], [place] [address]    Phone: [phone] FAX: [phone]     Subjective: Afebrile.  VSS.  WBC normal.   Objective:  Vital signs:  Filed Vitals:   05/08/14 0000 05/08/14 0200 05/08/14 0309 05/08/14 0642  BP: 143/67 152/74 140/79   Pulse: 77 73 75   Temp:   98 F (36.7 C)   TempSrc:   Oral   Resp: 19 16 18    Height:      Weight:    201 lb 15.1 oz (91.6 kg)  SpO2: 93% 93% 97%     Last BM Date: 05/07/14  Intake/Output   Yesterday:  09/20 0701 - 09/21 0700 In: 6812 [P.O.:330; I.V.:700; IV Piggyback:550] Out: 2950 [Urine:2950] This shift:    I/O last 3 completed shifts: In: 2720 [P.O.:330; I.V.:1300; Other:140; IV Piggyback:950] Out: 4500 [Urine:4500]     Physical Exam: General: Pt awake/alert/oriented x4 in no acute distress Skin: left buttock wound, some eschar noted, no surrounding erythema or areas of fluctuance, dressing repacked.    Problem List:   Principal Problem:   Poorly controlled type 2 diabetes mellitus Active Problems:   Tachycardia-multifactorial   Essential hypertension, benign   Severe sepsis without septic shock   Noncompliance with diabetes treatment   Fournier's gangrene into true pelvis s/p I&D 05/05/2014   DKA (diabetic ketoacidoses)    Results:   Labs: Results for orders placed during the hospital encounter of 05/04/14 (from the past 48 hour(s))  GLUCOSE, CAPILLARY     Status: Abnormal   Collection Time    05/06/14 12:31 PM      Result Value Ref Range   Glucose-Capillary 191 (*) 70 - 99 mg/dL  GLUCOSE, CAPILLARY     Status: Abnormal   Collection Time    05/06/14  3:43 PM      Result Value Ref Range   Glucose-Capillary 231 (*) 70 - 99 mg/dL   Comment 1 Documented in Chart     Comment 2 Notify RN    VANCOMYCIN, TROUGH     Status: None   Collection Time   05/06/14  7:00 PM      Result Value Ref Range   Vancomycin Tr 13.1  10.0 - 20.0 ug/mL  GLUCOSE, CAPILLARY     Status: Abnormal   Collection Time    05/06/14  8:05 PM      Result Value Ref Range   Glucose-Capillary 165 (*) 70 - 99 mg/dL  GLUCOSE, CAPILLARY     Status: Abnormal   Collection Time    05/07/14  1:02 AM      Result Value Ref Range   Glucose-Capillary 228 (*) 70 - 99 mg/dL  BASIC METABOLIC PANEL     Status: Abnormal   Collection Time    05/07/14  3:41 AM      Result Value Ref Range   Sodium 136 (*) 137 - 147 mEq/L   Potassium 3.7  3.7 - 5.3 mEq/L   Chloride 100  96 - 112 mEq/L   CO2 25  19 - 32 mEq/L   Glucose, Bld 204 (*) 70 - 99 mg/dL   BUN 6  6 - 23 mg/dL   Creatinine, Ser 0.53  0.50 - 1.35 mg/dL   Calcium 8.4  8.4 - 10.5 mg/dL   GFR  calc non Af Amer >90  >90 mL/min   GFR calc Af Amer >90  >90 mL/min   Comment: (NOTE)     The eGFR has been calculated using the CKD EPI equation.     This calculation has not been validated in all clinical situations.     eGFR's persistently <90 mL/min signify possible Chronic Kidney     Disease.   Anion gap 11  5 - 15  CBC WITH DIFFERENTIAL     Status: Abnormal   Collection Time    05/07/14  3:41 AM      Result Value Ref Range   WBC 8.5  4.0 - 10.5 K/uL   RBC 4.48  4.22 - 5.81 MIL/uL   Hemoglobin 12.3 (*) 13.0 - 17.0 g/dL   HCT 36.0 (*) 39.0 - 52.0 %   MCV 80.4  78.0 - 100.0 fL   MCH 27.5  26.0 - 34.0 pg   MCHC 34.2  30.0 - 36.0 g/dL   RDW 12.4  11.5 - 15.5 %   Platelets 207  150 - 400 K/uL   Neutrophils Relative % 76  43 - 77 %   Neutro Abs 6.4  1.7 - 7.7 K/uL   Lymphocytes Relative 12  12 - 46 %   Lymphs Abs 1.1  0.7 - 4.0 K/uL   Monocytes Relative 11  3 - 12 %   Monocytes Absolute 0.9  0.1 - 1.0 K/uL   Eosinophils Relative 1  0 - 5 %   Eosinophils Absolute 0.1  0.0 - 0.7 K/uL   Basophils Relative 0  0 - 1 %   Basophils Absolute 0.0  0.0 - 0.1 K/uL  HEMOGLOBIN A1C     Status: Abnormal   Collection Time     05/07/14  3:41 AM      Result Value Ref Range   Hemoglobin A1C 11.9 (*) <5.7 %   Comment: (NOTE)                                                                               According to the ADA Clinical Practice Recommendations for 2011, when     HbA1c is used as a screening test:      >=6.5%   Diagnostic of Diabetes Mellitus               (if abnormal result is confirmed)     5.7-6.4%   Increased risk of developing Diabetes Mellitus     References:Diagnosis and Classification of Diabetes Mellitus,Diabetes     OFHQ,1975,88(TGPQD 1):S62-S69 and Standards of Medical Care in             Diabetes - 2011,Diabetes Care,2011,34 (Suppl 1):S11-S61.   Mean Plasma Glucose 295 (*) <117 mg/dL   Comment: Performed at [facility], CAPILLARY     Status: Abnormal   Collection Time    05/07/14  4:10 AM      Result Value Ref Range   Glucose-Capillary 205 (*) 70 - 99 mg/dL  GLUCOSE, CAPILLARY     Status: Abnormal   Collection Time    05/07/14  8:02 AM      Result Value Ref Range   Glucose-Capillary  189 (*) 70 - 99 mg/dL   Comment 1 Documented in Chart     Comment 2 Notify RN    GLUCOSE, CAPILLARY     Status: Abnormal   Collection Time    05/07/14 11:36 AM      Result Value Ref Range   Glucose-Capillary 162 (*) 70 - 99 mg/dL   Comment 1 Documented in Chart     Comment 2 Notify RN    GLUCOSE, CAPILLARY     Status: Abnormal   Collection Time    05/07/14  3:27 PM      Result Value Ref Range   Glucose-Capillary 167 (*) 70 - 99 mg/dL   Comment 1 Documented in Chart     Comment 2 Notify RN    GLUCOSE, CAPILLARY     Status: Abnormal   Collection Time    05/07/14  8:57 PM      Result Value Ref Range   Glucose-Capillary 190 (*) 70 - 99 mg/dL  CBC     Status: Abnormal   Collection Time    05/08/14  5:18 AM      Result Value Ref Range   WBC 6.7  4.0 - 10.5 K/uL   RBC 4.31  4.22 - 5.81 MIL/uL   Hemoglobin 12.2 (*) 13.0 - 17.0 g/dL   HCT 35.5 (*) 39.0 - 52.0 %   MCV 82.4  78.0  - 100.0 fL   MCH 28.3  26.0 - 34.0 pg   MCHC 34.4  30.0 - 36.0 g/dL   RDW 12.5  11.5 - 15.5 %   Platelets 212  150 - 400 K/uL  BASIC METABOLIC PANEL     Status: Abnormal   Collection Time    05/08/14  5:18 AM      Result Value Ref Range   Sodium 138  137 - 147 mEq/L   Potassium 3.4 (*) 3.7 - 5.3 mEq/L   Chloride 100  96 - 112 mEq/L   CO2 29  19 - 32 mEq/L   Glucose, Bld 190 (*) 70 - 99 mg/dL   BUN 5 (*) 6 - 23 mg/dL   Creatinine, Ser 0.54  0.50 - 1.35 mg/dL   Calcium 8.2 (*) 8.4 - 10.5 mg/dL   GFR calc non Af Amer >90  >90 mL/min   GFR calc Af Amer >90  >90 mL/min   Comment: (NOTE)     The eGFR has been calculated using the CKD EPI equation.     This calculation has not been validated in all clinical situations.     eGFR's persistently <90 mL/min signify possible Chronic Kidney     Disease.   Anion gap 9  5 - 15  GLUCOSE, CAPILLARY     Status: Abnormal   Collection Time    05/08/14  7:25 AM      Result Value Ref Range   Glucose-Capillary 178 (*) 70 - 99 mg/dL   Comment 1 Notify RN      Imaging / Studies: No results found.  Medications / Allergies:  Scheduled Meds: . heparin  5,000 Units Subcutaneous 3 times per day  . insulin aspart  0-15 Units Subcutaneous TID WC  . insulin aspart  0-5 Units Subcutaneous QHS  . insulin glargine  20 Units Subcutaneous Daily  . lip balm  1 application Topical BID  . living well with diabetes book   Does not apply Once  . piperacillin-tazobactam (ZOSYN)  IV  3.375 g Intravenous 3 times per day  .  vancomycin  1,000 mg Intravenous Q8H  . vitamin C  500 mg Oral BID   Continuous Infusions: . sodium chloride 500 mL (05/07/14 1933)   PRN Meds:.sodium chloride, acetaminophen, alum & mag hydroxide-simeth, bisacodyl, dextrose, diphenhydrAMINE, HYDROmorphone (DILAUDID) injection, magic mouthwash, ondansetron (ZOFRAN) IV, ondansetron (ZOFRAN) IV, oxyCODONE  Antibiotics: Anti-infectives   Start     Dose/Rate Route Frequency Ordered Stop    05/05/14 1200  vancomycin (VANCOCIN) 1,500 mg in sodium chloride 0.9 % 500 mL IVPB  Status:  Discontinued     1,500 mg 250 mL/hr over 120 Minutes Intravenous Every 12 hours 05/05/14 0628 05/05/14 0844   05/05/14 1200  vancomycin (VANCOCIN) IVPB 1000 mg/200 mL premix     1,000 mg 200 mL/hr over 60 Minutes Intravenous Every 8 hours 05/05/14 0844     05/05/14 0800  piperacillin-tazobactam (ZOSYN) IVPB 3.375 g     3.375 g 12.5 mL/hr over 240 Minutes Intravenous 3 times per day 05/05/14 0628     05/05/14 0115  piperacillin-tazobactam (ZOSYN) IVPB 3.375 g     3.375 g 100 mL/hr over 30 Minutes Intravenous  Once 05/05/14 0112 05/05/14 0314   05/05/14 0115  clindamycin (CLEOCIN) IVPB 900 mg     900 mg 100 mL/hr over 30 Minutes Intravenous  Once 05/05/14 0115 05/05/14 0219   05/05/14 0000  vancomycin (VANCOCIN) IVPB 1000 mg/200 mL premix     1,000 mg 200 mL/hr over 60 Minutes Intravenous  Once 05/04/14 2345 05/05/14 0340        Assessment/Plan Fournier's gangrene/perinal abscess  IRRIGATION AND DEBRIDEMENT ABSCESS left buttock - 05/05/14 - Gross  -continue with BID wet to dry dressing changes -patient/family teaching -will need home health -atbx per primary team   Erby Pian, ANP-BC [facility] Pager [phone](7A-4:30P) For consults and floor pages call [phone](7A-4:30P)  05/08/2014 9:45 AM

## 2014-05-08 NOTE — Progress Notes (Addendum)
Inpatient Diabetes Program Recommendations  AACE/ADA: New Consensus Statement on Inpatient Glycemic Control (2013)  Target Ranges:  Prepandial:   less than 140 mg/dL      Peak postprandial:   less than 180 mg/dL (1-2 hours)      Critically ill patients:  140 - 180 mg/dL   Spoke with patient regarding glucose control at home. Pt states he use to take lantus 45 units bid prior to losing his insurance.  Pt has never has any education on diet.  Have ordered dietician consult for basic nutrition education.  Have also ordered pt ed videos primarily on nutrition. Pt states he use to take metformin at home as well, however it upset his stomach severely with diarrhea.  Pt has not been on any meds nor checking his cbg's since he lost his insurance. Informed him about the ReliOn Meter, test strips and insulins. Pt could be discharged on Novolin 70/30 at discharge. If using 0.4 units per kg, pt would require 45 units 70/30 bid (just as he was previously on Novolog 70/30). Pt can get referral to OP education once he is set up with [facility] program for which he already has an appt scheduled. Thank you, Rosita Kea, RN, CNS, Diabetes Coordinator [phone])  Ad. Pt can get Novolog 70/30 with [facility] if appt is already made. AC

## 2014-05-08 NOTE — Progress Notes (Signed)
PROGRESS NOTE    Derek Lowery VQQ:[id number] DOB: 1956-04-03 DOA: 05/04/2014 PCP: Salena Saner., MD  HPI/Brief narrative 58 year old male patient with history of long-standing DM 2/has not taken any medications for the last 2 years, hypercholesterolemia, essential hypertension, CAD/NSTEMI 06/2011, presented to hospital on 05/04/14 with the buttocks so for one week which was progressively worsening. CCM admitted him to ICU for DKA and Fournier's gangrene/perinal abscess. DKA resolved and patient is s/p I&D of left buttock abscess on 05/05/14. Care was transferred to [facility] on 9/20.   Assessment/Plan:  1. DKA in uncontrolled DM2; admitted to ICU and treated with aggressive IV fluids and DKA protocol. DKA resolved. 2. Uncontrolled DM 2: Patient states that he has not taken any medications for the last 2 years secondary to lack of insurance and financial difficulties. A1c: 11.9. For now continue Lantus and SSI-adjust doses. Eventually we'll have to discharge on 70/30 insulin-likely 45 units twice a day. Diabetes coordinator input appreciated. Blood sugar control much improved. 3. Fournier's gangrene/perinal abscess: Status post I&D 05/05/14. Management per surgery. Continue IV vancomycin and Zosyn- we'll consider narrowing spectrum in a.m. and complete 7-10 days treatment. Consider removing Foley catheter-defer to surgery . 4. Essential hypertension: Controlled off medications. 5. History of CAD/MI: Asymptomatic of chest pain. 6. Severe sepsis Sepsis, present on admission: Secondary to perianal abscess. Sepsis features resolved. Blood cultures x2 negative to date. Wound cultures x2: Negative to date. Urine culture x1 negative. Tachycardia improved/resolved. 7. Medication noncompliance/financial & insurance issues: Case management consultation. 8. Hypokalemia: Replaced 9. Hyperlipidemia:    Code Status: Full Family Communication: None at bedside. Disposition Plan: home possibly in the next  2-3 days.    Consultants:  General surgery  CCM-signed off 9/19  Procedures:  Incision and drainage of perianal abscess on 9/18  Foley catheter  Antibiotics:  IV Zosyn 9/17 >  IV vancomycin 9/17 >  IV clindamycin x1 dose   Subjective:  Patient denies complaints.   Objective: Filed Vitals:   05/08/14 0200 05/08/14 0309 05/08/14 0642 05/08/14 1418  BP: 152/74 140/79  131/69  Pulse: 73 75  72  Temp:  98 F (36.7 C)  98.3 F (36.8 C)  TempSrc:  Oral  Oral  Resp: 16 18  20   Height:      Weight:   91.6 kg (201 lb 15.1 oz)   SpO2: 93% 97%  97%    Intake/Output Summary (Last 24 hours) at 05/08/14 1809 Last data filed at 05/08/14 1602  Gross per 24 hour  Intake    400 ml  Output   2275 ml  Net  -1875 ml   Filed Weights   05/05/14 0245 05/07/14 0402 05/08/14 0642  Weight: 89.4 kg (197 lb 1.5 oz) 92.4 kg (203 lb 11.3 oz) 91.6 kg (201 lb 15.1 oz)     Exam:  General exam: Pleasant middle-aged male lying comfortably in bed. Respiratory system: Clear. No increased work of breathing. Cardiovascular system: S1 & S2 heard, RRR. No JVD, murmurs, gallops, clicks or pedal edema. Telemetry: Sinus rhythm. Gastrointestinal system: Abdomen is nondistended, soft and nontender. Normal bowel sounds heard. Foley catheter +. Buttock/perianal wound dressing clean and dry. Central nervous system: Alert and oriented. No focal neurological deficits. Extremities: Symmetric 5 x 5 power.   Data Reviewed: Basic Metabolic Panel:  Recent Labs Lab 05/05/14 0124 05/05/14 0515  05/05/14 2047 05/06/14 0054 05/06/14 0511 05/07/14 0341 05/08/14 0518  NA 131* 135*  < > 134* 132* 134* 136* 138  K 4.3  3.9  < > 3.4* 3.1* 3.4* 3.7 3.4*  CL 103 103  < > 101 100 100 100 100  CO2  --  16*  < > 19 21 23 25 29   GLUCOSE 285* 189*  < > 172* 129* 181* 204* 190*  BUN 17 13  < > 7 6 6 6  5*  CREATININE 0.60 0.52  < > 0.52 0.52 0.53 0.53 0.54  CALCIUM  --  8.1*  < > 8.2* 8.1* 8.3* 8.4 8.2*  MG   --  1.6  --   --   --   --   --   --   PHOS  --  2.2*  --   --   --   --   --   --   < > = values in this interval not displayed. Liver Function Tests: No results found for this basename: AST, ALT, ALKPHOS, BILITOT, PROT, ALBUMIN,  in the last 168 hours No results found for this basename: LIPASE, AMYLASE,  in the last 168 hours No results found for this basename: AMMONIA,  in the last 168 hours CBC:  Recent Labs Lab 05/04/14 2326 05/05/14 0124 05/05/14 0515 05/06/14 0511 05/07/14 0341 05/08/14 0518  WBC 13.6*  --  13.9* 9.9 8.5 6.7  NEUTROABS 11.4*  --   --   --  6.4  --   HGB 15.1 16.0 12.8* 12.4* 12.3* 12.2*  HCT 43.3 47.0 37.8* 36.2* 36.0* 35.5*  MCV 80.8  --  82.4 82.1 80.4 82.4  PLT 175  --  169  169 164 207 212   Cardiac Enzymes: No results found for this basename: CKTOTAL, CKMB, CKMBINDEX, TROPONINI,  in the last 168 hours BNP (last 3 results) No results found for this basename: PROBNP,  in the last 8760 hours CBG:  Recent Labs Lab 05/07/14 1527 05/07/14 2057 05/08/14 0725 05/08/14 1125 05/08/14 1616  GLUCAP 167* 190* 178* 141* 190*    Recent Results (from the past 240 hour(s))  CULTURE, BLOOD (ROUTINE X 2)     Status: None   Collection Time    05/05/14 12:05 AM      Result Value Ref Range Status   Specimen Description BLOOD LEFT ANTECUBITAL   Final   Special Requests BOTTLES DRAWN AEROBIC AND ANAEROBIC 3CC   Final   Culture  Setup Time     Final   Value: 05/05/2014 05:35     Performed at [facility]   Culture     Final   Value:        BLOOD CULTURE RECEIVED NO GROWTH TO DATE CULTURE WILL BE HELD FOR 5 DAYS BEFORE ISSUING A FINAL NEGATIVE REPORT     Performed at [facility]   Report Status PENDING   Incomplete  CULTURE, BLOOD (ROUTINE X 2)     Status: None   Collection Time    05/05/14 12:15 AM      Result Value Ref Range Status   Specimen Description BLOOD RIGHT HAND   Final   Special Requests BOTTLES DRAWN AEROBIC AND ANAEROBIC  3CC   Final   Culture  Setup Time     Final   Value: 05/05/2014 05:36     Performed at [facility]   Culture     Final   Value:        BLOOD CULTURE RECEIVED NO GROWTH TO DATE CULTURE WILL BE HELD FOR 5 DAYS BEFORE ISSUING A FINAL NEGATIVE REPORT  Performed at [facility]   Report Status PENDING   Incomplete  MRSA PCR SCREENING     Status: None   Collection Time    05/05/14  3:26 AM      Result Value Ref Range Status   MRSA by PCR NEGATIVE  NEGATIVE Final   Comment:            The GeneXpert MRSA Assay (FDA     approved for NASAL specimens     only), is one component of a     comprehensive MRSA colonization     surveillance program. It is not     intended to diagnose MRSA     infection nor to guide or     monitor treatment for     MRSA infections.  ANAEROBIC CULTURE     Status: None   Collection Time    05/05/14  3:59 AM      Result Value Ref Range Status   Specimen Description BUTTOCKS LEFT   Final   Special Requests NONE   Final   Gram Stain     Final   Value: FEW WBC PRESENT,BOTH PMN AND MONONUCLEAR     NO SQUAMOUS EPITHELIAL CELLS SEEN     ABUNDANT GRAM NEGATIVE RODS     FEW GRAM POSITIVE COCCI     IN PAIRS IN CLUSTERS   Culture     Final   Value: NO ANAEROBES ISOLATED; CULTURE IN PROGRESS FOR 5 DAYS     Performed at [facility]   Report Status PENDING   Incomplete  CULTURE, ROUTINE-ABSCESS     Status: None   Collection Time    05/05/14  3:59 AM      Result Value Ref Range Status   Specimen Description BUTTOCKS LEFT   Final   Special Requests NONE   Final   Gram Stain     Final   Value: FEW WBC PRESENT,BOTH PMN AND MONONUCLEAR     NO SQUAMOUS EPITHELIAL CELLS SEEN     ABUNDANT GRAM NEGATIVE RODS     FEW GRAM POSITIVE COCCI     IN PAIRS IN CLUSTERS   Culture     Final   Value: MULTIPLE ORGANISMS PRESENT, NONE PREDOMINANT     Note: NO STAPHYLOCOCCUS AUREUS ISOLATED NO GROUP A STREP (S.PYOGENES) ISOLATED     Performed at [facility]   Report Status 05/08/2014 FINAL   Final  URINE CULTURE     Status: None   Collection Time    05/05/14  6:30 AM      Result Value Ref Range Status   Specimen Description URINE, CLEAN CATCH   Final   Special Requests NONE   Final   Culture  Setup Time     Final   Value: 05/05/2014 09:50     Performed at [place]     Final   Value: NO GROWTH     Performed at [facility]   Culture     Final   Value: NO GROWTH     Performed at [facility]   Report Status 05/06/2014 FINAL   Final      Studies: No results found.      Scheduled Meds: . heparin  5,000 Units Subcutaneous 3 times per day  . insulin aspart  0-15 Units Subcutaneous TID WC  . insulin aspart  0-5 Units Subcutaneous QHS  . insulin glargine  20 Units Subcutaneous Daily  .  lip balm  1 application Topical BID  . living well with diabetes book   Does not apply Once  . piperacillin-tazobactam (ZOSYN)  IV  3.375 g Intravenous 3 times per day  . vancomycin  1,000 mg Intravenous Q8H  . vitamin C  500 mg Oral BID   Continuous Infusions: . sodium chloride 500 mL (05/07/14 1933)    Principal Problem:   Poorly controlled type 2 diabetes mellitus Active Problems:   Tachycardia-multifactorial   Essential hypertension, benign   Severe sepsis without septic shock   Noncompliance with diabetes treatment   Fournier's gangrene into true pelvis s/p I&D 05/05/2014   DKA (diabetic ketoacidoses)    Time spent: 63 minutes    Chiyo Fay, MD, FACP, FHM. [facility] Pager [phone]  If 7PM-7AM, please contact night-coverage [email or web address] Password [email or web address] 05/08/2014, 6:09 PM    LOS: 4 days

## 2014-05-09 LAB — BASIC METABOLIC PANEL
ANION GAP: 8 (ref 5–15)
BUN: 6 mg/dL (ref 6–23)
CO2: 32 mEq/L (ref 19–32)
CREATININE: 0.77 mg/dL (ref 0.50–1.35)
Calcium: 8.6 mg/dL (ref 8.4–10.5)
Chloride: 101 mEq/L (ref 96–112)
Glucose, Bld: 175 mg/dL — ABNORMAL HIGH (ref 70–99)
Potassium: 3.5 mEq/L — ABNORMAL LOW (ref 3.7–5.3)
SODIUM: 141 meq/L (ref 137–147)

## 2014-05-09 LAB — GLUCOSE, CAPILLARY
GLUCOSE-CAPILLARY: 156 mg/dL — AB (ref 70–99)
GLUCOSE-CAPILLARY: 211 mg/dL — AB (ref 70–99)
GLUCOSE-CAPILLARY: 136 mg/dL — AB (ref 70–99)
Glucose-Capillary: 154 mg/dL — ABNORMAL HIGH (ref 70–99)
Glucose-Capillary: 163 mg/dL — ABNORMAL HIGH (ref 70–99)

## 2014-05-09 MED ORDER — VANCOMYCIN HCL 10 G IV SOLR: 1250.0000 mg | Freq: Two times a day (BID) | INTRAVENOUS | Status: DC

## 2014-05-09 MED ORDER — INSULIN ASPART PROT & ASPART (70-30 MIX) 100 UNIT/ML ~~LOC~~ SUSP
18.0000 [IU] | Freq: Two times a day (BID) | SUBCUTANEOUS | Status: DC
  Administered 2014-05-09 – 2014-05-10 (×3): 18 [IU] via SUBCUTANEOUS
  Filled 2014-05-09: qty 10

## 2014-05-09 MED ORDER — AMOXICILLIN-POT CLAVULANATE 875-125 MG PO TABS
1.0000 | ORAL_TABLET | Freq: Two times a day (BID) | ORAL | Status: DC
  Administered 2014-05-09 – 2014-05-10 (×3): 1 via ORAL
  Filled 2014-05-09 (×4): qty 1

## 2014-05-09 MED ORDER — POTASSIUM CHLORIDE CRYS ER 20 MEQ PO TBCR
40.0000 meq | EXTENDED_RELEASE_TABLET | Freq: Once | ORAL | Status: AC
  Administered 2014-05-09: 40 meq via ORAL
  Filled 2014-05-09: qty 2

## 2014-05-09 NOTE — Progress Notes (Signed)
ANTIBIOTIC CONSULT NOTE - follow up  Pharmacy Consult for Vancomycin and Zosyn  Indication: L buttock abscess  No Known Allergies  Patient Measurements: Height: 5' 10"  (177.8 cm) Weight: 200 lb 6.4 oz (90.901 kg) IBW/kg (Calculated) : 73   Vital Signs: Temp: 97.7 F (36.5 C) (09/22 0528) Temp src: Oral (09/22 0528) BP: 137/86 mmHg (09/22 0528) Pulse Rate: 67 (09/22 0528) Intake/Output from previous day: 09/21 0701 - 09/22 0700 In: 1239 [I.V.:289; IV Piggyback:950] Out: 1000 [Urine:1000] Intake/Output from this shift: Total I/O In: -  Out: 2050 [Urine:2050]  Labs:  Recent Labs  05/07/14 0341 05/08/14 0518 05/09/14 0450  WBC 8.5 6.7  --   HGB 12.3* 12.2*  --   PLT 207 212  --   CREATININE 0.53 0.54 0.77   Estimated Creatinine Clearance: 117 ml/min (by C-G formula based on Cr of 0.77).  Recent Labs  05/06/14 1900  VANCOTROUGH 13.1     Microbiology: Recent Results (from the past 720 hour(s))  CULTURE, BLOOD (ROUTINE X 2)     Status: None   Collection Time    05/05/14 12:05 AM      Result Value Ref Range Status   Specimen Description BLOOD LEFT ANTECUBITAL   Final   Special Requests BOTTLES DRAWN AEROBIC AND ANAEROBIC 3CC   Final   Culture  Setup Time     Final   Value: 05/05/2014 05:35     Performed at [facility]   Culture     Final   Value:        BLOOD CULTURE RECEIVED NO GROWTH TO DATE CULTURE WILL BE HELD FOR 5 DAYS BEFORE ISSUING A FINAL NEGATIVE REPORT     Performed at [facility]   Report Status PENDING   Incomplete  CULTURE, BLOOD (ROUTINE X 2)     Status: None   Collection Time    05/05/14 12:15 AM      Result Value Ref Range Status   Specimen Description BLOOD RIGHT HAND   Final   Special Requests BOTTLES DRAWN AEROBIC AND ANAEROBIC 3CC   Final   Culture  Setup Time     Final   Value: 05/05/2014 05:36     Performed at [facility]   Culture     Final   Value:        BLOOD CULTURE RECEIVED NO GROWTH TO DATE  CULTURE WILL BE HELD FOR 5 DAYS BEFORE ISSUING A FINAL NEGATIVE REPORT     Performed at [facility]   Report Status PENDING   Incomplete  MRSA PCR SCREENING     Status: None   Collection Time    05/05/14  3:26 AM      Result Value Ref Range Status   MRSA by PCR NEGATIVE  NEGATIVE Final   Comment:            The GeneXpert MRSA Assay (FDA     approved for NASAL specimens     only), is one component of a     comprehensive MRSA colonization     surveillance program. It is not     intended to diagnose MRSA     infection nor to guide or     monitor treatment for     MRSA infections.  ANAEROBIC CULTURE     Status: None   Collection Time    05/05/14  3:59 AM      Result Value Ref Range Status   Specimen Description BUTTOCKS LEFT  Final   Special Requests NONE   Final   Gram Stain     Final   Value: FEW WBC PRESENT,BOTH PMN AND MONONUCLEAR     NO SQUAMOUS EPITHELIAL CELLS SEEN     ABUNDANT GRAM NEGATIVE RODS     FEW GRAM POSITIVE COCCI     IN PAIRS IN CLUSTERS   Culture     Final   Value: NO ANAEROBES ISOLATED; CULTURE IN PROGRESS FOR 5 DAYS     Performed at [facility]   Report Status PENDING   Incomplete  CULTURE, ROUTINE-ABSCESS     Status: None   Collection Time    05/05/14  3:59 AM      Result Value Ref Range Status   Specimen Description BUTTOCKS LEFT   Final   Special Requests NONE   Final   Gram Stain     Final   Value: FEW WBC PRESENT,BOTH PMN AND MONONUCLEAR     NO SQUAMOUS EPITHELIAL CELLS SEEN     ABUNDANT GRAM NEGATIVE RODS     FEW GRAM POSITIVE COCCI     IN PAIRS IN CLUSTERS   Culture     Final   Value: MULTIPLE ORGANISMS PRESENT, NONE PREDOMINANT     Note: NO STAPHYLOCOCCUS AUREUS ISOLATED NO GROUP A STREP (S.PYOGENES) ISOLATED     Performed at [facility]   Report Status 05/08/2014 FINAL   Final  URINE CULTURE     Status: None   Collection Time    05/05/14  6:30 AM      Result Value Ref Range Status   Specimen Description  URINE, CLEAN CATCH   Final   Special Requests NONE   Final   Culture  Setup Time     Final   Value: 05/05/2014 09:50     Performed at [place]     Final   Value: NO GROWTH     Performed at [facility]   Culture     Final   Value: NO GROWTH     Performed at [facility]   Report Status 05/06/2014 FINAL   Final    Medical History: Past Medical History  Diagnosis Date  . Hypercholesteremia   . Type 2 diabetes mellitus 1990  . Internal hemorrhoids   . Essential hypertension, benign   . Noncompliance   . CAD (coronary artery disease)     NSTEMI 06/2011:  LHC 07/18/11: Proximal diagonal 60%, distal LAD with a diabetic appearance and 60% stenosis, OM2 with an occluded superior branch and an inferior branch with 90%, EF 55% with inferior hypokinesis.  PCI: Promus DES to the OM2 inferior branch.  This vessel provides collaterals to the superior branch which remained occluded.  Echocardiogram 07/18/11: EF 60%, normal wall motion.  . Glaucoma     Medications:  Anti-infectives   Start     Dose/Rate Route Frequency Ordered Stop   05/09/14 2000  vancomycin (VANCOCIN) 1,250 mg in sodium chloride 0.9 % 250 mL IVPB     1,250 mg 166.7 mL/hr over 90 Minutes Intravenous Every 12 hours 05/09/14 0804     05/05/14 1200  vancomycin (VANCOCIN) 1,500 mg in sodium chloride 0.9 % 500 mL IVPB  Status:  Discontinued     1,500 mg 250 mL/hr over 120 Minutes Intravenous Every 12 hours 05/05/14 0628 05/05/14 0844   05/05/14 1200  vancomycin (VANCOCIN) IVPB 1000 mg/200 mL premix  Status:  Discontinued  1,000 mg 200 mL/hr over 60 Minutes Intravenous Every 8 hours 05/05/14 0844 05/09/14 0804   05/05/14 0800  piperacillin-tazobactam (ZOSYN) IVPB 3.375 g     3.375 g 12.5 mL/hr over 240 Minutes Intravenous 3 times per day 05/05/14 0628     05/05/14 0115  piperacillin-tazobactam (ZOSYN) IVPB 3.375 g     3.375 g 100 mL/hr over 30 Minutes Intravenous  Once 05/05/14  0112 05/05/14 0314   05/05/14 0115  clindamycin (CLEOCIN) IVPB 900 mg     900 mg 100 mL/hr over 30 Minutes Intravenous  Once 05/05/14 0115 05/05/14 0219   05/05/14 0000  vancomycin (VANCOCIN) IVPB 1000 mg/200 mL premix     1,000 mg 200 mL/hr over 60 Minutes Intravenous  Once 05/04/14 2345 05/05/14 0340     Assessment: 58 yo male with DM2 presented to ER on 9/18 with buttock sore x 1 week and found to have Fournier's gangrene/abscess. Pt also found to be in DKA. Emergent irrigation and debridement for Fournier's gangrene into pelvic presacral space performed AM 9/18 and started on vanc and Zosyn per pharmacy dosing  Goal of Therapy:  Vancomycin trough level 15-20 mcg/ml (due to abscess) Zosyn based on renal function   9/18 >> vancomycin >> 9/18 >> Zosyn >>  Cultures: 9/18 blood: ngtd 9/18 urine: NGF 9/18 abscess/wound: Multiple organisms, none predominant;  NO S.aureus or Grp A strep isolated.  Dose changes/drug level info:  9/19 VT at 1900 =  13.88mg/ml on 1g q8 (prior to 6th dose)   9/22: D#5 vancomycin 1 gram IV q8h D#5 Zosyn 3.375 grams IV q8h (extended-infusion)  Remains afebrile  WBC remains WNL  Small upward trend in SCr, significance uncertain; remains WNL  No S.aureus or Grp A strep in wound culture   Plan:  1. Based on culture results suggest consider discontinuing vancomycin - await MD rounds.   2. In the interim, will empirically reduce vancomycin dosage to 1250 mg IV q12h with next dose delayed until 8pm.   3. Recheck SCr tomorrow.  RClayburn Pert PharmD, BCPS Pager: [phone]/22/2015  8:11 AM

## 2014-05-09 NOTE — Progress Notes (Signed)
Patient ID: Derek Lowery, male   DOB: 12/23/1955, 58 y.o.   MRN: [id number]     [facility]      [address]   [place], [place] [address]    Phone: [phone] FAX: [phone]     Subjective: VSS.  Afebrile.  Tolerating dressing changes.   Objective:  Vital signs:  Filed Vitals:   05/08/14 0642 05/08/14 1418 05/08/14 2219 05/09/14 0528  BP:  131/69 135/78 137/86  Pulse:  72 72 67  Temp:  98.3 F (36.8 C) 98.3 F (36.8 C) 97.7 F (36.5 C)  TempSrc:  Oral Oral Oral  Resp:  20 18 18  Height:      Weight: 201 lb 15.1 oz (91.6 kg)   200 lb 6.4 oz (90.901 kg)  SpO2:  97% 96% 97%    Last BM Date: 05/07/14  Intake/Output   Yesterday:  09/21 0701 - 09/22 0700 In: 1239 [I.V.:289; IV Piggyback:950] Out: 1000 [Urine:1000] This shift:  Total I/O In: -  Out: 2050 [Urine:2050]  Physical Exam:  General: Pt awake/alert/oriented x4 in no acute distress  Skin: left buttock wound, the brown discoloration is from bovie, not necrotic, no surrounding erythema or areas of fluctuance, dressing repacked.    Problem List:   Principal Problem:   Poorly controlled type 2 diabetes mellitus Active Problems:   Tachycardia-multifactorial   Essential hypertension, benign   Severe sepsis without septic shock   Noncompliance with diabetes treatment   Fournier's gangrene into true pelvis s/p I&D 05/05/2014   DKA (diabetic ketoacidoses)    Results:   Labs: Results for orders placed during the hospital encounter of 05/04/14 (from the past 48 hour(s))  GLUCOSE, CAPILLARY     Status: Abnormal   Collection Time    05/07/14 11:36 AM      Result Value Ref Range   Glucose-Capillary 162 (*) 70 - 99 mg/dL   Comment 1 Documented in Chart     Comment 2 Notify RN    GLUCOSE, CAPILLARY     Status: Abnormal   Collection Time    05/07/14  3:27 PM      Result Value Ref Range   Glucose-Capillary 167 (*) 70 - 99 mg/dL   Comment 1 Documented in  Chart     Comment 2 Notify RN    GLUCOSE, CAPILLARY     Status: Abnormal   Collection Time    05/07/14  8:57 PM      Result Value Ref Range   Glucose-Capillary 190 (*) 70 - 99 mg/dL  CBC     Status: Abnormal   Collection Time    05/08/14  5:18 AM      Result Value Ref Range   WBC 6.7  4.0 - 10.5 K/uL   RBC 4.31  4.22 - 5.81 MIL/uL   Hemoglobin 12.2 (*) 13.0 - 17.0 g/dL   HCT 35.5 (*) 39.0 - 52.0 %   MCV 82.4  78.0 - 100.0 fL   MCH 28.3  26.0 - 34.0 pg   MCHC 34.4  30.0 - 36.0 g/dL   RDW 12.5  11.5 - 15.5 %   Platelets 212  150 - 400 K/uL  BASIC METABOLIC PANEL     Status: Abnormal   Collection Time    05/08/14  5:18 AM      Result Value Ref Range   Sodium 138  137 - 147 mEq/L   Potassium 3.4 (*) 3.7 - 5.3 mEq/L     Chloride 100  96 - 112 mEq/L   CO2 29  19 - 32 mEq/L   Glucose, Bld 190 (*) 70 - 99 mg/dL   BUN 5 (*) 6 - 23 mg/dL   Creatinine, Ser 0.54  0.50 - 1.35 mg/dL   Calcium 8.2 (*) 8.4 - 10.5 mg/dL   GFR calc non Af Amer >90  >90 mL/min   GFR calc Af Amer >90  >90 mL/min   Comment: (NOTE)     The eGFR has been calculated using the CKD EPI equation.     This calculation has not been validated in all clinical situations.     eGFR's persistently <90 mL/min signify possible Chronic Kidney     Disease.   Anion gap 9  5 - 15  GLUCOSE, CAPILLARY     Status: Abnormal   Collection Time    05/08/14  7:25 AM      Result Value Ref Range   Glucose-Capillary 178 (*) 70 - 99 mg/dL   Comment 1 Notify RN    GLUCOSE, CAPILLARY     Status: Abnormal   Collection Time    05/08/14 11:25 AM      Result Value Ref Range   Glucose-Capillary 141 (*) 70 - 99 mg/dL   Comment 1 Notify RN    GLUCOSE, CAPILLARY     Status: Abnormal   Collection Time    05/08/14  4:16 PM      Result Value Ref Range   Glucose-Capillary 190 (*) 70 - 99 mg/dL   Comment 1 Notify RN    GLUCOSE, CAPILLARY     Status: Abnormal   Collection Time    05/08/14 10:15 PM      Result Value Ref Range    Glucose-Capillary 156 (*) 70 - 99 mg/dL   Comment 1 Notify RN    BASIC METABOLIC PANEL     Status: Abnormal   Collection Time    05/09/14  4:50 AM      Result Value Ref Range   Sodium 141  137 - 147 mEq/L   Potassium 3.5 (*) 3.7 - 5.3 mEq/L   Chloride 101  96 - 112 mEq/L   CO2 32  19 - 32 mEq/L   Glucose, Bld 175 (*) 70 - 99 mg/dL   BUN 6  6 - 23 mg/dL   Creatinine, Ser 0.77  0.50 - 1.35 mg/dL   Calcium 8.6  8.4 - 10.5 mg/dL   GFR calc non Af Amer >90  >90 mL/min   GFR calc Af Amer >90  >90 mL/min   Comment: (NOTE)     The eGFR has been calculated using the CKD EPI equation.     This calculation has not been validated in all clinical situations.     eGFR's persistently <90 mL/min signify possible Chronic Kidney     Disease.   Anion gap 8  5 - 15  GLUCOSE, CAPILLARY     Status: Abnormal   Collection Time    05/09/14  7:44 AM      Result Value Ref Range   Glucose-Capillary 154 (*) 70 - 99 mg/dL    Imaging / Studies: No results found.  Medications / Allergies:  Scheduled Meds: . amoxicillin-clavulanate  1 tablet Oral Q12H  . heparin  5,000 Units Subcutaneous 3 times per day  . insulin aspart protamine- aspart  18 Units Subcutaneous BID WC  . lip balm  1 application Topical BID  . living well with diabetes book  Does not apply Once  . potassium chloride  40 mEq Oral Once  . vitamin C  500 mg Oral BID   Continuous Infusions: . sodium chloride 500 mL (05/07/14 1933)   PRN Meds:.sodium chloride, acetaminophen, alum & mag hydroxide-simeth, bisacodyl, dextrose, diphenhydrAMINE, HYDROmorphone (DILAUDID) injection, magic mouthwash, ondansetron (ZOFRAN) IV, ondansetron (ZOFRAN) IV, oxyCODONE  Antibiotics: Anti-infectives   Start     Dose/Rate Route Frequency Ordered Stop   05/09/14 2000  vancomycin (VANCOCIN) 1,250 mg in sodium chloride 0.9 % 250 mL IVPB  Status:  Discontinued     1,250 mg 166.7 mL/hr over 90 Minutes Intravenous Lowery 12 hours 05/09/14 0804 05/09/14 0916    05/09/14 1000  amoxicillin-clavulanate (AUGMENTIN) 875-125 MG per tablet 1 tablet     1 tablet Oral Lowery 12 hours 05/09/14 0916     05/05/14 1200  vancomycin (VANCOCIN) 1,500 mg in sodium chloride 0.9 % 500 mL IVPB  Status:  Discontinued     1,500 mg 250 mL/hr over 120 Minutes Intravenous Lowery 12 hours 05/05/14 0628 05/05/14 0844   05/05/14 1200  vancomycin (VANCOCIN) IVPB 1000 mg/200 mL premix  Status:  Discontinued     1,000 mg 200 mL/hr over 60 Minutes Intravenous Lowery 8 hours 05/05/14 0844 05/09/14 0804   05/05/14 0800  piperacillin-tazobactam (ZOSYN) IVPB 3.375 g  Status:  Discontinued     3.375 g 12.5 mL/hr over 240 Minutes Intravenous 3 times per day 05/05/14 0628 05/09/14 0916   05/05/14 0115  piperacillin-tazobactam (ZOSYN) IVPB 3.375 g     3.375 g 100 mL/hr over 30 Minutes Intravenous  Once 05/05/14 0112 05/05/14 0314   05/05/14 0115  clindamycin (CLEOCIN) IVPB 900 mg     900 mg 100 mL/hr over 30 Minutes Intravenous  Once 05/05/14 0115 05/05/14 0219   05/05/14 0000  vancomycin (VANCOCIN) IVPB 1000 mg/200 mL premix     1,000 mg 200 mL/hr over 60 Minutes Intravenous  Once 05/04/14 2345 05/05/14 0340      Assessment/Plan  Fournier's gangrene/perinal abscess  IRRIGATION AND DEBRIDEMENT ABSCESS left buttock - 05/05/14 - Gross  -continue with BID wet to dry dressing changes  -patient/family teaching  -will need home health  -may change to PO antibiotics -may DC foley from surgical standpoint   Erby Pian, ANP-BC [facility] Pager [phone](7A-4:30P) For consults and floor pages call [phone](7A-4:30P)  05/09/2014 9:33 AM

## 2014-05-09 NOTE — Progress Notes (Signed)
PROGRESS NOTE    Derek Lowery TKP:[id number] DOB: 06/27/1956 DOA: 05/04/2014 PCP: Salena Saner., MD  HPI/Brief narrative 58 year old male patient with history of long-standing DM 2/has not taken any medications for the last 2 years, hypercholesterolemia, essential hypertension, CAD/NSTEMI 06/2011, presented to hospital on 05/04/14 with the buttocks so for one week which was progressively worsening. CCM admitted him to ICU for DKA and Fournier's gangrene/perinal abscess. DKA resolved and patient is s/p I&D of left buttock abscess on 05/05/14. Care was transferred to [facility] on 9/20.   Assessment/Plan:  1. DKA in uncontrolled DM2; admitted to ICU and treated with aggressive IV fluids and DKA protocol. DKA resolved. 2. Uncontrolled DM 2: Patient states that he has not taken any medications for the last 2 years secondary to lack of insurance and financial difficulties. A1c: 11.9. Here was on Lantus and SSI. Will change to 70/30 insulin-likely 18 units twice a day, in preparation for DC in am. Diabetes coordinator input appreciated. Blood sugar control much improved. 3. Fournier's gangrene/perinal abscess: Status post I&D 05/05/14. Management per surgery. Continue IV vancomycin and Zosyn- we'll consider narrowing spectrum in a.m. and complete 7-10 days treatment. Discussed with surgery-DC Foley catheter. Will change to by mouth Augmentin. Okay to discharge from surgical standpoint. Continue twice a day wet-to-dry dressings-home health RN. 4. Essential hypertension: Controlled off medications. 5. History of CAD/MI: Asymptomatic of chest pain. 6. Severe sepsis Sepsis, present on admission: Secondary to perianal abscess. Sepsis features resolved. Blood cultures x2 negative to date. Wound cultures x2: Negative to date. Urine culture x1 negative. Sepsis physiology resolved. 7. Medication noncompliance/financial & insurance issues: Case management consultation. 8. Hypokalemia:  Replaced 9. Hyperlipidemia:    Code Status: Full Family Communication: None at bedside. Disposition Plan: DC home 9/23   Consultants:  General surgery  CCM-signed off 9/19  Procedures:  Incision and drainage of perianal abscess on 9/18  Foley catheter-DC'd 9/22  Antibiotics:  IV Zosyn 9/17 > 9/22  IV vancomycin 9/17 > 9/22  IV clindamycin x1 dose   PO Augmentin 9/22 >  Subjective:  Patient denies complaints.   Objective: Filed Vitals:   05/08/14 1418 05/08/14 2219 05/09/14 0528 05/09/14 1358  BP: 131/69 135/78 137/86 143/79  Pulse: 72 72 67 79  Temp: 98.3 F (36.8 C) 98.3 F (36.8 C) 97.7 F (36.5 C) 97.7 F (36.5 C)  TempSrc: Oral Oral Oral Oral  Resp: 20 18 18 20   Height:      Weight:   90.901 kg (200 lb 6.4 oz)   SpO2: 97% 96% 97% 99%    Intake/Output Summary (Last 24 hours) at 05/09/14 1640 Last data filed at 05/09/14 1400  Gross per 24 hour  Intake   1239 ml  Output   3200 ml  Net  -1961 ml   Filed Weights   05/07/14 0402 05/08/14 0642 05/09/14 0528  Weight: 92.4 kg (203 lb 11.3 oz) 91.6 kg (201 lb 15.1 oz) 90.901 kg (200 lb 6.4 oz)     Exam:  General exam: Pleasant middle-aged male lying comfortably in bed. Respiratory system: Clear. No increased work of breathing. Cardiovascular system: S1 & S2 heard, RRR. No JVD, murmurs, gallops, clicks or pedal edema.  Gastrointestinal system: Abdomen is nondistended, soft and nontender. Normal bowel sounds heard. Foley catheter +. Buttock/perianal wound dressing clean and dry. Central nervous system: Alert and oriented. No focal neurological deficits. Extremities: Symmetric 5 x 5 power.   Data Reviewed: Basic Metabolic Panel:  Recent Labs Lab 05/05/14 0124 05/05/14  2706  05/06/14 0054 05/06/14 0511 05/07/14 0341 05/08/14 0518 05/09/14 0450  NA 131* 135*  < > 132* 134* 136* 138 141  K 4.3 3.9  < > 3.1* 3.4* 3.7 3.4* 3.5*  CL 103 103  < > 100 100 100 100 101  CO2  --  16*  < > 21 23 25  29  32  GLUCOSE 285* 189*  < > 129* 181* 204* 190* 175*  BUN 17 13  < > 6 6 6  5* 6  CREATININE 0.60 0.52  < > 0.52 0.53 0.53 0.54 0.77  CALCIUM  --  8.1*  < > 8.1* 8.3* 8.4 8.2* 8.6  MG  --  1.6  --   --   --   --   --   --   PHOS  --  2.2*  --   --   --   --   --   --   < > = values in this interval not displayed. Liver Function Tests: No results found for this basename: AST, ALT, ALKPHOS, BILITOT, PROT, ALBUMIN,  in the last 168 hours No results found for this basename: LIPASE, AMYLASE,  in the last 168 hours No results found for this basename: AMMONIA,  in the last 168 hours CBC:  Recent Labs Lab 05/04/14 2326 05/05/14 0124 05/05/14 0515 05/06/14 0511 05/07/14 0341 05/08/14 0518  WBC 13.6*  --  13.9* 9.9 8.5 6.7  NEUTROABS 11.4*  --   --   --  6.4  --   HGB 15.1 16.0 12.8* 12.4* 12.3* 12.2*  HCT 43.3 47.0 37.8* 36.2* 36.0* 35.5*  MCV 80.8  --  82.4 82.1 80.4 82.4  PLT 175  --  169  169 164 207 212   Cardiac Enzymes: No results found for this basename: CKTOTAL, CKMB, CKMBINDEX, TROPONINI,  in the last 168 hours BNP (last 3 results) No results found for this basename: PROBNP,  in the last 8760 hours CBG:  Recent Labs Lab 05/08/14 1125 05/08/14 1616 05/08/14 2215 05/09/14 0744 05/09/14 1136  GLUCAP 141* 190* 156* 154* 211*    Recent Results (from the past 240 hour(s))  CULTURE, BLOOD (ROUTINE X 2)     Status: None   Collection Time    05/05/14 12:05 AM      Result Value Ref Range Status   Specimen Description BLOOD LEFT ANTECUBITAL   Final   Special Requests BOTTLES DRAWN AEROBIC AND ANAEROBIC 3CC   Final   Culture  Setup Time     Final   Value: 05/05/2014 05:35     Performed at [facility]   Culture     Final   Value:        BLOOD CULTURE RECEIVED NO GROWTH TO DATE CULTURE WILL BE HELD FOR 5 DAYS BEFORE ISSUING A FINAL NEGATIVE REPORT     Performed at [facility]   Report Status PENDING   Incomplete  CULTURE, BLOOD (ROUTINE X 2)      Status: None   Collection Time    05/05/14 12:15 AM      Result Value Ref Range Status   Specimen Description BLOOD RIGHT HAND   Final   Special Requests BOTTLES DRAWN AEROBIC AND ANAEROBIC 3CC   Final   Culture  Setup Time     Final   Value: 05/05/2014 05:36     Performed at [facility]   Culture     Final   Value:  BLOOD CULTURE RECEIVED NO GROWTH TO DATE CULTURE WILL BE HELD FOR 5 DAYS BEFORE ISSUING A FINAL NEGATIVE REPORT     Performed at [facility]   Report Status PENDING   Incomplete  MRSA PCR SCREENING     Status: None   Collection Time    05/05/14  3:26 AM      Result Value Ref Range Status   MRSA by PCR NEGATIVE  NEGATIVE Final   Comment:            The GeneXpert MRSA Assay (FDA     approved for NASAL specimens     only), is one component of a     comprehensive MRSA colonization     surveillance program. It is not     intended to diagnose MRSA     infection nor to guide or     monitor treatment for     MRSA infections.  ANAEROBIC CULTURE     Status: None   Collection Time    05/05/14  3:59 AM      Result Value Ref Range Status   Specimen Description BUTTOCKS LEFT   Final   Special Requests NONE   Final   Gram Stain     Final   Value: FEW WBC PRESENT,BOTH PMN AND MONONUCLEAR     NO SQUAMOUS EPITHELIAL CELLS SEEN     ABUNDANT GRAM NEGATIVE RODS     FEW GRAM POSITIVE COCCI     IN PAIRS IN CLUSTERS   Culture     Final   Value: NO ANAEROBES ISOLATED; CULTURE IN PROGRESS FOR 5 DAYS     Performed at [facility]   Report Status PENDING   Incomplete  CULTURE, ROUTINE-ABSCESS     Status: None   Collection Time    05/05/14  3:59 AM      Result Value Ref Range Status   Specimen Description BUTTOCKS LEFT   Final   Special Requests NONE   Final   Gram Stain     Final   Value: FEW WBC PRESENT,BOTH PMN AND MONONUCLEAR     NO SQUAMOUS EPITHELIAL CELLS SEEN     ABUNDANT GRAM NEGATIVE RODS     FEW GRAM POSITIVE COCCI     IN PAIRS IN  CLUSTERS   Culture     Final   Value: MULTIPLE ORGANISMS PRESENT, NONE PREDOMINANT     Note: NO STAPHYLOCOCCUS AUREUS ISOLATED NO GROUP A STREP (S.PYOGENES) ISOLATED     Performed at [facility]   Report Status 05/08/2014 FINAL   Final  URINE CULTURE     Status: None   Collection Time    05/05/14  6:30 AM      Result Value Ref Range Status   Specimen Description URINE, CLEAN CATCH   Final   Special Requests NONE   Final   Culture  Setup Time     Final   Value: 05/05/2014 09:50     Performed at [place]     Final   Value: NO GROWTH     Performed at [facility]   Culture     Final   Value: NO GROWTH     Performed at [facility]   Report Status 05/06/2014 FINAL   Final      Studies: No results found.      Scheduled Meds: . amoxicillin-clavulanate  1 tablet Oral Q12H  . heparin  5,000 Units Subcutaneous  3 times per day  . insulin aspart protamine- aspart  18 Units Subcutaneous BID WC  . lip balm  1 application Topical BID  . living well with diabetes book   Does not apply Once  . vitamin C  500 mg Oral BID   Continuous Infusions: . sodium chloride 500 mL (05/07/14 1933)    Principal Problem:   Poorly controlled type 2 diabetes mellitus Active Problems:   Tachycardia-multifactorial   Essential hypertension, benign   Severe sepsis without septic shock   Noncompliance with diabetes treatment   Fournier's gangrene into true pelvis s/p I&D 05/05/2014   DKA (diabetic ketoacidoses)    Time spent: 55 minutes    Tkeyah Burkman, MD, FACP, FHM. [facility] Pager [phone]  If 7PM-7AM, please contact night-coverage [email or web address] Password [email or web address] 05/09/2014, 4:40 PM    LOS: 5 days

## 2014-05-10 LAB — BASIC METABOLIC PANEL
ANION GAP: 11 (ref 5–15)
BUN: 7 mg/dL (ref 6–23)
CHLORIDE: 101 meq/L (ref 96–112)
CO2: 31 mEq/L (ref 19–32)
Calcium: 9.1 mg/dL (ref 8.4–10.5)
Creatinine, Ser: 0.81 mg/dL (ref 0.50–1.35)
GFR calc Af Amer: >90 mL/min (ref >90)
GFR calc non Af Amer: >90 mL/min (ref >90)
Glucose, Bld: 148 mg/dL — ABNORMAL HIGH (ref 70–99)
POTASSIUM: 4.2 meq/L (ref 3.7–5.3)
Sodium: 143 mEq/L (ref 137–147)

## 2014-05-10 LAB — ANAEROBIC CULTURE

## 2014-05-10 LAB — GLUCOSE, CAPILLARY
GLUCOSE-CAPILLARY: 160 mg/dL — AB (ref 70–99)
Glucose-Capillary: 173 mg/dL — ABNORMAL HIGH (ref 70–99)

## 2014-05-10 MED ORDER — INSULIN ASPART PROT & ASPART (70-30 MIX) 100 UNIT/ML ~~LOC~~ SUSP: 18.0000 [IU] | Freq: Two times a day (BID) | SUBCUTANEOUS | Status: DC

## 2014-05-10 MED ORDER — AMOXICILLIN-POT CLAVULANATE 875-125 MG PO TABS: 1.0000 | ORAL_TABLET | Freq: Two times a day (BID) | ORAL | Status: DC

## 2014-05-10 MED ORDER — "INSULIN SYRINGE-NEEDLE U-100 29G X 1/2"" 0.3 ML MISC": Status: DC

## 2014-05-10 MED ORDER — ACETAMINOPHEN 325 MG PO TABS: 650.0000 mg | ORAL_TABLET | Freq: Four times a day (QID) | ORAL | Status: DC | PRN

## 2014-05-10 MED ORDER — ASCORBIC ACID 500 MG PO TABS: 500.0000 mg | ORAL_TABLET | Freq: Two times a day (BID) | ORAL | Status: DC

## 2014-05-10 MED ORDER — OXYCODONE HCL 5 MG PO TABS: 5.0000 mg | ORAL_TABLET | Freq: Four times a day (QID) | ORAL | Status: DC | PRN

## 2014-05-10 MED ORDER — FREESTYLE SYSTEM KIT: 1.0000 | PACK | Status: DC | PRN

## 2014-05-10 NOTE — Progress Notes (Signed)
Patient ID: Derek Lowery, male   DOB: 09-29-1955, 58 y.o.   MRN: [id number]     [place]      [address] [place], [place] [address]    Phone: [phone] FAX: [phone]     Subjective: No issues overnight.    Objective:  Vital signs:  Filed Vitals:   05/09/14 0528 05/09/14 1358 05/09/14 2152 05/10/14 0534  BP: 137/86 143/79 135/80 143/79  Pulse: 67 79 73 67  Temp: 97.7 F (36.5 C) 97.7 F (36.5 C) 98.3 F (36.8 C) 97.9 F (36.6 C)  TempSrc: Oral Oral Oral Oral  Resp: 18 20 18 18   Height:      Weight: 200 lb 6.4 oz (90.901 kg)   197 lb 1.5 oz (89.4 kg)  SpO2: 97% 99% 95% 98%    Last BM Date: 05/07/14  Intake/Output   Yesterday:  09/22 0701 - 09/23 0700 In: 360 [P.O.:360] Out: 2900 [Urine:2900] This shift:    I/O last 3 completed shifts: In: 1599 [P.O.:360; I.V.:289; IV Piggyback:950] Out: 3200 [Urine:3200]    Physical Exam:  General: Pt awake/alert/oriented x4 in no acute distress  Skin: left buttock wound, the brown discoloration is from bovie, not necrotic, no surrounding erythema or areas of fluctuance, dressing repacked.    Problem List:   Principal Problem:   Poorly controlled type 2 diabetes mellitus Active Problems:   Tachycardia-multifactorial   Essential hypertension, benign   Severe sepsis without septic shock   Noncompliance with diabetes treatment   Fournier's gangrene into true pelvis s/p I&D 05/05/2014   DKA (diabetic ketoacidoses)    Results:   Labs: Results for orders placed during the hospital encounter of 05/04/14 (from the past 48 hour(s))  GLUCOSE, CAPILLARY     Status: Abnormal   Collection Time    05/08/14 11:25 AM      Result Value Ref Range   Glucose-Capillary 141 (*) 70 - 99 mg/dL   Comment 1 Notify RN    GLUCOSE, CAPILLARY     Status: Abnormal   Collection Time    05/08/14  4:16 PM      Result Value Ref Range   Glucose-Capillary 190 (*) 70 - 99 mg/dL   Comment 1 Notify RN    GLUCOSE, CAPILLARY     Status: Abnormal   Collection Time    05/08/14 10:15 PM      Result Value Ref Range   Glucose-Capillary 156 (*) 70 - 99 mg/dL   Comment 1 Notify RN    BASIC METABOLIC PANEL     Status: Abnormal   Collection Time    05/09/14  4:50 AM      Result Value Ref Range   Sodium 141  137 - 147 mEq/L   Potassium 3.5 (*) 3.7 - 5.3 mEq/L   Chloride 101  96 - 112 mEq/L   CO2 32  19 - 32 mEq/L   Glucose, Bld 175 (*) 70 - 99 mg/dL   BUN 6  6 - 23 mg/dL   Creatinine, Ser 0.77  0.50 - 1.35 mg/dL   Calcium 8.6  8.4 - 10.5 mg/dL   GFR calc non Af Amer >90  >90 mL/min   GFR calc Af Amer >90  >90 mL/min   Comment: (NOTE)     The eGFR has been calculated using the CKD EPI equation.     This calculation has not been validated in all clinical situations.     eGFR's persistently <  90 mL/min signify possible Chronic Kidney     Disease.   Anion gap 8  5 - 15  GLUCOSE, CAPILLARY     Status: Abnormal   Collection Time    05/09/14  7:44 AM      Result Value Ref Range   Glucose-Capillary 154 (*) 70 - 99 mg/dL  GLUCOSE, CAPILLARY     Status: Abnormal   Collection Time    05/09/14 11:36 AM      Result Value Ref Range   Glucose-Capillary 211 (*) 70 - 99 mg/dL  GLUCOSE, CAPILLARY     Status: Abnormal   Collection Time    05/09/14  4:38 PM      Result Value Ref Range   Glucose-Capillary 136 (*) 70 - 99 mg/dL   Comment 1 Notify RN    GLUCOSE, CAPILLARY     Status: Abnormal   Collection Time    05/09/14  9:55 PM      Result Value Ref Range   Glucose-Capillary 163 (*) 70 - 99 mg/dL  BASIC METABOLIC PANEL     Status: Abnormal   Collection Time    05/10/14  4:51 AM      Result Value Ref Range   Sodium 143  137 - 147 mEq/L   Potassium 4.2  3.7 - 5.3 mEq/L   Chloride 101  96 - 112 mEq/L   CO2 31  19 - 32 mEq/L   Glucose, Bld 148 (*) 70 - 99 mg/dL   BUN 7  6 - 23 mg/dL   Creatinine, Ser 0.81  0.50 - 1.35 mg/dL   Calcium 9.1  8.4 - 10.5 mg/dL   GFR calc non  Af Amer >90  >90 mL/min   GFR calc Af Amer >90  >90 mL/min   Comment: (NOTE)     The eGFR has been calculated using the CKD EPI equation.     This calculation has not been validated in all clinical situations.     eGFR's persistently <90 mL/min signify possible Chronic Kidney     Disease.   Anion gap 11  5 - 15  GLUCOSE, CAPILLARY     Status: Abnormal   Collection Time    05/10/14  7:15 AM      Result Value Ref Range   Glucose-Capillary 160 (*) 70 - 99 mg/dL   Comment 1 Notify RN      Imaging / Studies: No results found.  Medications / Allergies:  Scheduled Meds: . amoxicillin-clavulanate  1 tablet Oral Q12H  . heparin  5,000 Units Subcutaneous 3 times per day  . insulin aspart protamine- aspart  18 Units Subcutaneous BID WC  . lip balm  1 application Topical BID  . living well with diabetes book   Does not apply Once  . vitamin C  500 mg Oral BID   Continuous Infusions: . sodium chloride 500 mL (05/07/14 1933)   PRN Meds:.sodium chloride, acetaminophen, alum & mag hydroxide-simeth, bisacodyl, dextrose, diphenhydrAMINE, HYDROmorphone (DILAUDID) injection, magic mouthwash, ondansetron (ZOFRAN) IV, ondansetron (ZOFRAN) IV, oxyCODONE  Antibiotics: Anti-infectives   Start     Dose/Rate Route Frequency Ordered Stop   05/09/14 2000  vancomycin (VANCOCIN) 1,250 mg in sodium chloride 0.9 % 250 mL IVPB  Status:  Discontinued     1,250 mg 166.7 mL/hr over 90 Minutes Intravenous Every 12 hours 05/09/14 0804 05/09/14 0916   05/09/14 1000  amoxicillin-clavulanate (AUGMENTIN) 875-125 MG per tablet 1 tablet     1 tablet Oral Every 12 hours  05/09/14 0916     05/05/14 1200  vancomycin (VANCOCIN) 1,500 mg in sodium chloride 0.9 % 500 mL IVPB  Status:  Discontinued     1,500 mg 250 mL/hr over 120 Minutes Intravenous Every 12 hours 05/05/14 0628 05/05/14 0844   05/05/14 1200  vancomycin (VANCOCIN) IVPB 1000 mg/200 mL premix  Status:  Discontinued     1,000 mg 200 mL/hr over 60 Minutes  Intravenous Every 8 hours 05/05/14 0844 05/09/14 0804   05/05/14 0800  piperacillin-tazobactam (ZOSYN) IVPB 3.375 g  Status:  Discontinued     3.375 g 12.5 mL/hr over 240 Minutes Intravenous 3 times per day 05/05/14 0628 05/09/14 0916   05/05/14 0115  piperacillin-tazobactam (ZOSYN) IVPB 3.375 g     3.375 g 100 mL/hr over 30 Minutes Intravenous  Once 05/05/14 0112 05/05/14 0314   05/05/14 0115  clindamycin (CLEOCIN) IVPB 900 mg     900 mg 100 mL/hr over 30 Minutes Intravenous  Once 05/05/14 0115 05/05/14 0219   05/05/14 0000  vancomycin (VANCOCIN) IVPB 1000 mg/200 mL premix     1,000 mg 200 mL/hr over 60 Minutes Intravenous  Once 05/04/14 2345 05/05/14 0340       Assessment/Plan  Fournier's gangrene/perinal abscess  IRRIGATION AND DEBRIDEMENT ABSCESS left buttock - 05/05/14 - Gross  -continue with BID wet to dry dressing changes  -sister will change dressing, HH, changed to PO antibiotics -stable for discharge from surgical standpoint.  Follow up arranged.  Instructions provided.      Erby Pian, [facility] Pager [phone]) For consults and floor pages call [phone]([id number])  05/10/2014 9:36 AM

## 2014-05-10 NOTE — Discharge Summary (Signed)
Physician Discharge Summary  Derek Lowery QMG:[id number] DOB: 07-Sep-1955 DOA: 05/04/2014  PCP: Salena Saner., MD  Admit date: 05/04/2014 Discharge date: 05/10/2014  Time spent: Greater than 30 minutes  Recommendations for Outpatient Follow-up:  1. [facility]/PCP on 05/11/2014 at 8:45 AM.  2. [facility] [place]/General Surgery, on 05/23/2014 at 3 PM. 3. Home health RN.   Discharge Diagnoses:  Principal Problem:   Poorly controlled type 2 diabetes mellitus Active Problems:   Tachycardia-multifactorial   Essential hypertension, benign   Severe sepsis without septic shock   Noncompliance with diabetes treatment   Fournier's gangrene into true pelvis s/p I&D 05/05/2014   DKA (diabetic ketoacidoses)   Discharge Condition: Improved & Stable  Diet recommendation: Heart healthy and diabetic diet.  Filed Weights   05/08/14 8916 05/09/14 0528 05/10/14 0534  Weight: 91.6 kg (201 lb 15.1 oz) 90.901 kg (200 lb 6.4 oz) 89.4 kg (197 lb 1.5 oz)    History of present illness:  58 year old male patient with history of long-standing DM 2/has not taken any medications for the last 2 years, hypercholesterolemia, essential hypertension, CAD/NSTEMI 06/2011, presented to hospital on 05/04/14 with the buttocks so for one week which was progressively worsening. CCM admitted him to ICU for DKA and Fournier's gangrene/perinal abscess. DKA resolved and patient is s/p I&D of left buttock abscess on 05/05/14. Care was transferred to [facility] on 9/20.  Hospital Course:   1. DKA in uncontrolled DM2; admitted to ICU and treated with aggressive IV fluids and DKA protocol. DKA resolved. 2. Uncontrolled DM 2: Patient states that he has not taken any medications for the last 2 years secondary to lack of insurance and financial difficulties. A1c: 11.9. Here was on Lantus and SSI. Changed to 70/30 insulin- 18 units twice a day. Diabetes coordinator input appreciated. Blood sugar control much  improved. May consider referral to outpatient Diabetes Education during followup at [facility] clinic. 3. Fournier's gangrene/perinal abscess: Status post I&D 05/05/14. Management per surgery. Initially on IV vancomycin and Zosyn- changed to Augmentin and plan to complete total 10 days treatment. Continue twice a day wet-to-dry dressings-home health RN. 4. Essential hypertension: Controlled off medications. 5. History of CAD/MI: Asymptomatic of chest pain. 6. Severe sepsis Sepsis, present on admission: Secondary to perianal abscess. Sepsis features resolved. Blood cultures x2 negative to date. Wound cultures x2: Negative to date. Urine culture x1 negative. Sepsis physiology resolved. 7. Medication noncompliance/financial & insurance issues: Case management consultation. 8. Hypokalemia: Replaced 9. Hyperlipidemia:  Consultants:  General surgery  CCM-signed off 9/19  Procedures:  Incision and drainage of perianal abscess on 9/18  Foley catheter-DC'd 9/22   Discharge Exam:  Complaints:  Denies complaints. No reported pain.  Filed Vitals:   05/09/14 0528 05/09/14 1358 05/09/14 2152 05/10/14 0534  BP: 137/86 143/79 135/80 143/79  Pulse: 67 79 73 67  Temp: 97.7 F (36.5 C) 97.7 F (36.5 C) 98.3 F (36.8 C) 97.9 F (36.6 C)  TempSrc: Oral Oral Oral Oral  Resp: 18 20 18 18   Height:      Weight: 90.901 kg (200 lb 6.4 oz)   89.4 kg (197 lb 1.5 oz)  SpO2: 97% 99% 95% 98%   General exam: Pleasant middle-aged male sitting up comfortably in bed.  Respiratory system: Clear. No increased work of breathing.  Cardiovascular system: S1 & S2 heard, RRR. No JVD, murmurs, gallops, clicks or pedal edema.  Gastrointestinal system: Abdomen is nondistended, soft and nontender. Normal bowel sounds heard. Buttock/perianal wound dressing clean  and dry.  Central nervous system: Alert and oriented. No focal neurological deficits.  Extremities: Symmetric 5 x 5 power.   Discharge Instructions       Discharge Instructions   Call MD for:  redness, tenderness, or signs of infection (pain, swelling, redness, odor or green/yellow discharge around incision site)    Complete by:  As directed      Call MD for:  severe uncontrolled pain    Complete by:  As directed      Call MD for:  temperature >100.4    Complete by:  As directed      Diet - low sodium heart healthy    Complete by:  As directed      Diet Carb Modified    Complete by:  As directed      Discharge wound care:    Complete by:  As directed   wet to dry dressing changes 2 times a day to buttock wound.     Increase activity slowly    Complete by:  As directed             Medication List    STOP taking these medications       GOODY HEADACHE PO      TAKE these medications       acetaminophen 325 MG tablet  Commonly known as:  TYLENOL  Take 2 tablets (650 mg total) by mouth every 6 (six) hours as needed for mild pain, fever or headache.     amoxicillin-clavulanate 875-125 MG per tablet  Commonly known as:  AUGMENTIN  Take 1 tablet by mouth every 12 (twelve) hours.     ascorbic acid 500 MG tablet  Commonly known as:  VITAMIN C  Take 1 tablet (500 mg total) by mouth 2 (two) times daily.     glucose monitoring kit monitoring kit  1 each by Does not apply route as needed for other.     insulin aspart protamine- aspart (70-30) 100 UNIT/ML injection  Commonly known as:  NOVOLOG MIX 70/30  Inject 0.18 mLs (18 Units total) into the skin 2 (two) times daily with a meal. May use Relion brand from [employer].     Insulin Syringe-Needle U-100 29G X 1/2" 0.3 ML Misc  Commonly known as:  SAFETY-GLIDE 0.3CC SYR 29GX1/2  Use as directed.     oxyCODONE 5 MG immediate release tablet  Commonly known as:  Oxy IR/ROXICODONE  Take 1 tablet (5 mg total) by mouth every 6 (six) hours as needed for moderate pain or severe pain.       Follow-up Information   Follow up with [facility]     On  05/11/2014. (8:45a/$20 co pay/bring photo id/meds in bottle.)    Contact information:   [place] [id number] [phone]      Follow up with [facility] On 05/23/2014. (arrive by 3PM please for a wound check)    Contact information:   [address]   [place] [address] [phone]        The results of significant diagnostics from this hospitalization (including imaging, microbiology, ancillary and laboratory) are listed below for reference.    Significant Diagnostic Studies: Ct Pelvis W Contrast  05/05/2014   ADDENDUM REPORT: 05/05/2014 02:20  ADDENDUM: Necrotizing fasciitis at the perineal and genital region is commonly defined as Fournier gangrene. There is no evidence of gluteal or fascial involvement at  this time.   Electronically Signed   By: Garald Balding M.D.   On: 05/05/2014 02:20   05/05/2014   CLINICAL DATA:  Evaluate for left buttock abscess. Left buttock erythema and induration. Leukocytosis.  EXAM: CT PELVIS WITH CONTRAST  TECHNIQUE: Multidetector CT imaging of the pelvis was performed using the standard protocol following the bolus administration of intravenous contrast.  CONTRAST:  116m OMNIPAQUE IOHEXOL 300 MG/ML  SOLN  COMPARISON:  Abdominal radiograph performed 07/10/2012  FINDINGS: Significant focal soft tissue air is noted at the medial left buttock, compatible with infection from a gas producing organism. This raises concern for necrotizing fasciitis. No fluid collection is seen to suggest a drainable abscess. Diffuse surrounding soft tissue inflammation extends laterally along the left buttock and inferiorly along the perineal soft tissues.  Mild soft tissue inflammation extends superiorly to the level of the coccyx, though there is no evidence of osseous involvement at this time. No acute osseous abnormalities are seen. There is a calcified disc protrusion at L4-L5, without evidence of impression on exiting nerve roots  at this level.  Visualized small and large bowel loops are grossly unremarkable in appearance. The bladder is mildly distended and grossly unremarkable. The prostate remains normal in size. No inguinal lymphadenopathy is seen.  Visualized vasculature is grossly unremarkable in appearance. Scattered vascular calcifications are seen within the pelvis.  IMPRESSION: 1. Significant focal soft tissue air at the medial left buttock, compatible with infection from a gas producing organism. This raises concern for necrotizing fasciitis. No drainable abscess seen at this time. 2. Diffuse soft tissue inflammation extends laterally along the left buttock and inferiorly along the perineal soft tissues, with minimal soft tissue inflammation extending superiorly to the level of the coccyx. No evidence of osseous involvement.  These results were called by telephone at the time of interpretation on 05/05/2014 at 1:08 am to Dr. AVarney Biles who verbally acknowledged these results.  Electronically Signed: By: JGarald BaldingM.D. On: 05/05/2014 01:13   Dg Chest Port 1 View  05/05/2014   CLINICAL DATA:  Abscess.  Diabetic.  EXAM: PORTABLE CHEST - 1 VIEW  COMPARISON:  07/10/2012  FINDINGS: The film is made with shallow lung inflation. Heart size is normal. Lungs are clear.  IMPRESSION: No evidence for acute  abnormality.   Electronically Signed   By: BShon HaleM.D.   On: 05/05/2014 01:33    Microbiology: Recent Results (from the past 240 hour(s))  CULTURE, BLOOD (ROUTINE X 2)     Status: None   Collection Time    05/05/14 12:05 AM      Result Value Ref Range Status   Specimen Description BLOOD LEFT ANTECUBITAL   Final   Special Requests BOTTLES DRAWN AEROBIC AND ANAEROBIC 3CC   Final   Culture  Setup Time     Final   Value: 05/05/2014 05:35     Performed at [facility]  Culture     Final   Value:        BLOOD CULTURE RECEIVED NO GROWTH TO DATE CULTURE WILL BE HELD FOR 5 DAYS BEFORE ISSUING A FINAL NEGATIVE  REPORT     Performed at [facility]  Report Status PENDING   Incomplete  CULTURE, BLOOD (ROUTINE X 2)     Status: None   Collection Time    05/05/14 12:15 AM      Result Value Ref Range Status   Specimen Description BLOOD RIGHT HAND   Final  Special Requests BOTTLES DRAWN AEROBIC AND ANAEROBIC 3CC   Final   Culture  Setup Time     Final   Value: 05/05/2014 05:36     Performed at [facility]   Culture     Final   Value:        BLOOD CULTURE RECEIVED NO GROWTH TO DATE CULTURE WILL BE HELD FOR 5 DAYS BEFORE ISSUING A FINAL NEGATIVE REPORT     Performed at [facility]   Report Status PENDING   Incomplete  MRSA PCR SCREENING     Status: None   Collection Time    05/05/14  3:26 AM      Result Value Ref Range Status   MRSA by PCR NEGATIVE  NEGATIVE Final   Comment:            The GeneXpert MRSA Assay (FDA     approved for NASAL specimens     only), is one component of a     comprehensive MRSA colonization     surveillance program. It is not     intended to diagnose MRSA     infection nor to guide or     monitor treatment for     MRSA infections.  ANAEROBIC CULTURE     Status: None   Collection Time    05/05/14  3:59 AM      Result Value Ref Range Status   Specimen Description BUTTOCKS LEFT   Final   Special Requests NONE   Final   Gram Stain     Final   Value: FEW WBC PRESENT,BOTH PMN AND MONONUCLEAR     NO SQUAMOUS EPITHELIAL CELLS SEEN     ABUNDANT GRAM NEGATIVE RODS     FEW GRAM POSITIVE COCCI     IN PAIRS IN CLUSTERS   Culture     Final   Value: NO ANAEROBES ISOLATED     Performed at [facility]   Report Status 05/10/2014 FINAL   Final  CULTURE, ROUTINE-ABSCESS     Status: None   Collection Time    05/05/14  3:59 AM      Result Value Ref Range Status   Specimen Description BUTTOCKS LEFT   Final   Special Requests NONE   Final   Gram Stain     Final   Value: FEW WBC PRESENT,BOTH PMN AND MONONUCLEAR     NO SQUAMOUS EPITHELIAL  CELLS SEEN     ABUNDANT GRAM NEGATIVE RODS     FEW GRAM POSITIVE COCCI     IN PAIRS IN CLUSTERS   Culture     Final   Value: MULTIPLE ORGANISMS PRESENT, NONE PREDOMINANT     Note: NO STAPHYLOCOCCUS AUREUS ISOLATED NO GROUP A STREP (S.PYOGENES) ISOLATED     Performed at [facility]   Report Status 05/08/2014 FINAL   Final  URINE CULTURE     Status: None   Collection Time    05/05/14  6:30 AM      Result Value Ref Range Status   Specimen Description URINE, CLEAN CATCH   Final   Special Requests NONE   Final   Culture  Setup Time     Final   Value: 05/05/2014 09:50     Performed at [place]     Final   Value: NO GROWTH     Performed at [facility]   Culture     Final   Value: NO  GROWTH     Performed at [facility]   Report Status 05/06/2014 FINAL   Final     Labs: Basic Metabolic Panel:  Recent Labs Lab 05/05/14 0124 05/05/14 0515  05/06/14 0511 05/07/14 0341 05/08/14 0518 05/09/14 0450 05/10/14 0451  NA 131* 135*  < > 134* 136* 138 141 143  K 4.3 3.9  < > 3.4* 3.7 3.4* 3.5* 4.2  CL 103 103  < > 100 100 100 101 101  CO2  --  16*  < > 23 25 29  32 31  GLUCOSE 285* 189*  < > 181* 204* 190* 175* 148*  BUN 17 13  < > 6 6 5* 6 7  CREATININE 0.60 0.52  < > 0.53 0.53 0.54 0.77 0.81  CALCIUM  --  8.1*  < > 8.3* 8.4 8.2* 8.6 9.1  MG  --  1.6  --   --   --   --   --   --   PHOS  --  2.2*  --   --   --   --   --   --   < > = values in this interval not displayed. Liver Function Tests: No results found for this basename: AST, ALT, ALKPHOS, BILITOT, PROT, ALBUMIN,  in the last 168 hours No results found for this basename: LIPASE, AMYLASE,  in the last 168 hours No results found for this basename: AMMONIA,  in the last 168 hours CBC:  Recent Labs Lab 05/04/14 2326 05/05/14 0124 05/05/14 0515 05/06/14 0511 05/07/14 0341 05/08/14 0518  WBC 13.6*  --  13.9* 9.9 8.5 6.7  NEUTROABS 11.4*  --   --   --  6.4  --   HGB 15.1  16.0 12.8* 12.4* 12.3* 12.2*  HCT 43.3 47.0 37.8* 36.2* 36.0* 35.5*  MCV 80.8  --  82.4 82.1 80.4 82.4  PLT 175  --  169  169 164 207 212   Cardiac Enzymes: No results found for this basename: CKTOTAL, CKMB, CKMBINDEX, TROPONINI,  in the last 168 hours BNP: BNP (last 3 results) No results found for this basename: PROBNP,  in the last 8760 hours CBG:  Recent Labs Lab 05/09/14 1136 05/09/14 1638 05/09/14 2155 05/10/14 0715 05/10/14 1125  GLUCAP 211* 136* 163* 160* 173*      Signed:  Vernell Leep, MD, FACP, FHM. [facility] Pager [phone]  If 7PM-7AM, please contact night-coverage [email or web address] Password [email or web address] 05/10/2014, 12:12 PM

## 2014-05-11 ENCOUNTER — Ambulatory Visit: Payer: Medicaid Other | Attending: Internal Medicine

## 2014-05-11 LAB — CULTURE, BLOOD (ROUTINE X 2)
Culture: NO GROWTH
Culture: NO GROWTH

## 2014-05-19 ENCOUNTER — Ambulatory Visit: Payer: Medicaid Other | Attending: Internal Medicine | Admitting: Internal Medicine

## 2014-05-19 ENCOUNTER — Encounter: Payer: Self-pay | Admitting: Internal Medicine

## 2014-05-19 DIAGNOSIS — Z09 Encounter for follow-up examination after completed treatment for conditions other than malignant neoplasm: Secondary | ICD-10-CM | POA: Diagnosis present

## 2014-05-19 DIAGNOSIS — I2510 Atherosclerotic heart disease of native coronary artery without angina pectoris: Secondary | ICD-10-CM | POA: Diagnosis not present

## 2014-05-19 DIAGNOSIS — Z79899 Other long term (current) drug therapy: Secondary | ICD-10-CM | POA: Insufficient documentation

## 2014-05-19 DIAGNOSIS — Z23 Encounter for immunization: Secondary | ICD-10-CM | POA: Insufficient documentation

## 2014-05-19 DIAGNOSIS — Z794 Long term (current) use of insulin: Secondary | ICD-10-CM | POA: Insufficient documentation

## 2014-05-19 DIAGNOSIS — E780 Pure hypercholesterolemia: Secondary | ICD-10-CM | POA: Insufficient documentation

## 2014-05-19 DIAGNOSIS — I10 Essential (primary) hypertension: Secondary | ICD-10-CM | POA: Diagnosis not present

## 2014-05-19 DIAGNOSIS — E119 Type 2 diabetes mellitus without complications: Secondary | ICD-10-CM

## 2014-05-19 DIAGNOSIS — E1165 Type 2 diabetes mellitus with hyperglycemia: Secondary | ICD-10-CM | POA: Diagnosis not present

## 2014-05-19 DIAGNOSIS — Z9119 Patient's noncompliance with other medical treatment and regimen: Secondary | ICD-10-CM | POA: Insufficient documentation

## 2014-05-19 DIAGNOSIS — I252 Old myocardial infarction: Secondary | ICD-10-CM | POA: Diagnosis not present

## 2014-05-19 LAB — GLUCOSE, POCT (MANUAL RESULT ENTRY): POC GLUCOSE: 225 mg/dL — AB (ref 70–99)

## 2014-05-19 MED ORDER — INSULIN GLARGINE 300 UNIT/ML ~~LOC~~ SOPN: 10.0000 [IU] | PEN_INJECTOR | Freq: Every day | SUBCUTANEOUS | Status: DC

## 2014-05-19 MED ORDER — INSULIN ASPART PROT & ASPART (70-30 MIX) 100 UNIT/ML ~~LOC~~ SUSP: 18.0000 [IU] | Freq: Two times a day (BID) | SUBCUTANEOUS | Status: DC

## 2014-05-19 MED ORDER — LISINOPRIL 10 MG PO TABS: 10.0000 mg | ORAL_TABLET | Freq: Every day | ORAL | Status: DC

## 2014-05-19 MED ORDER — ASPIRIN EC 81 MG PO TBEC: 81.0000 mg | DELAYED_RELEASE_TABLET | Freq: Every day | ORAL | Status: DC

## 2014-05-19 MED ORDER — ATORVASTATIN CALCIUM 40 MG PO TABS: 40.0000 mg | ORAL_TABLET | Freq: Every day | ORAL | Status: DC

## 2014-05-19 NOTE — Progress Notes (Signed)
Pt is here to establish care. Pt has a history of diabetes, HTN and hyperlipidemia.

## 2014-05-19 NOTE — Progress Notes (Signed)
Patient ID: Derek Lowery, male   DOB: 10/12/1955, 58 y.o.   MRN: [id number]  IWP:[id number]  NKN:[id number]  DOB - 06-08-1956  CC:  Chief Complaint  Patient presents with  . Establish Care       HPI: Derek Lowery is a 58 y.o. male here today to establish medical care.  Patient presents today as a hospital follow up s/p I&D of a perianal wound with necrotizing fasciitis and DKA.  Patient was previously on IV Vancomycin before discharge and then switched to PO Augmentin which he has completed.  Patient reports that he checks his sugar multiple times per day ranges from 150-197.  He is currently using 18 units of 70/30 twice daily.   He is not currently on antihypertensives.  He currently lives with his sister who helps him get to his doctor appointments. He denies alcohol, tobacco, and drug use.   No Known Allergies Past Medical History  Diagnosis Date  . Hypercholesteremia   . Type 2 diabetes mellitus 1990  . Internal hemorrhoids   . Essential hypertension, benign   . Noncompliance   . CAD (coronary artery disease)     NSTEMI 06/2011:  LHC 07/18/11: Proximal diagonal 60%, distal LAD with a diabetic appearance and 60% stenosis, OM2 with an occluded superior branch and an inferior branch with 90%, EF 55% with inferior hypokinesis.  PCI: Promus DES to the OM2 inferior branch.  This vessel provides collaterals to the superior branch which remained occluded.  Echocardiogram 07/18/11: EF 60%, normal wall motion.  . Glaucoma    Current Outpatient Prescriptions on File Prior to Visit  Medication Sig Dispense Refill  . acetaminophen (TYLENOL) 325 MG tablet Take 2 tablets (650 mg total) by mouth every 6 (six) hours as needed for mild pain, fever or headache.      . glucose monitoring kit (FREESTYLE) monitoring kit 1 each by Does not apply route as needed for other.  1 each  0  . insulin aspart protamine- aspart (NOVOLOG MIX 70/30) (70-30) 100 UNIT/ML injection Inject 0.18 mLs (18 Units total)  into the skin 2 (two) times daily with a meal. May use Relion brand from [employer].  10 mL  0  . Insulin Syringe-Needle U-100 (SAFETY-GLIDE 0.3CC SYR 29GX1/2) 29G X 1/2" 0.3 ML MISC Use as directed.  200 each  0  . oxyCODONE (OXY IR/ROXICODONE) 5 MG immediate release tablet Take 1 tablet (5 mg total) by mouth every 6 (six) hours as needed for moderate pain or severe pain.  20 tablet  0  . vitamin C (VITAMIN C) 500 MG tablet Take 1 tablet (500 mg total) by mouth 2 (two) times daily.      Marland Kitchen amoxicillin-clavulanate (AUGMENTIN) 875-125 MG per tablet Take 1 tablet by mouth every 12 (twelve) hours.  10 tablet  0   No current facility-administered medications on file prior to visit.   Family History  Problem Relation Age of Onset  . Coronary artery disease Father     Developed in his 26s  . Kidney disease Father   . Diabetes Father   . Heart failure Mother   . Hypertension Mother   . Diabetes Sister   . Diabetes Brother   . Melanoma Father    History   Social History  . Marital Status: Single    Spouse Name: N/A    Number of Children: N/A  . Years of Education: N/A   Occupational History  . Janitor    Social History Main Topics  .  Smoking status: Never Smoker   . Smokeless tobacco: Never Used  . Alcohol Use: No  . Drug Use: No  . Sexual Activity: No   Other Topics Concern  . Not on file   Social History Narrative   Works at [employer]   Review of Systems  Constitutional: Negative for fever, chills and weight loss.  HENT: Negative.   Eyes:       Left eye glaucoma  Respiratory: Negative.   Cardiovascular: Positive for leg swelling. Negative for chest pain, palpitations, orthopnea, claudication and PND.  Gastrointestinal: Negative.   Genitourinary: Negative.   Musculoskeletal: Negative.   Neurological: Positive for tingling (neuropathy).       Objective:   Filed Vitals:   05/19/14 1407  BP: 155/82  Pulse: 67  Temp: 99 F (37.2  C)  Resp: 16    Physical Exam: Constitutional: Patient appears well-developed and well-nourished. No distress. HENT: Normocephalic, atraumatic, External right and left ear normal. Oropharynx is clear and moist.  Eyes: Conjunctivae and EOM are normal. PERRLA, no scleral icterus. Neck: Normal ROM. Neck supple. No JVD. No tracheal deviation. No thyromegaly. CVS: RRR, S1/S2 +, no murmurs, no gallops, no carotid bruit.  Pulmonary: Effort and breath sounds normal, no stridor, rhonchi, wheezes, rales.  Abdominal: Soft. BS +, no distension, tenderness, rebound or guarding.  Musculoskeletal: Normal range of motion. No edema and no tenderness.  Lymphadenopathy: No lymphadenopathy noted, cervical, inguinal or axillary Neuro: Alert. Normal reflexes, muscle tone coordination. No cranial nerve deficit. Skin: Healing wound. Psychiatric: Normal mood and affect. Behavior, judgment, thought content normal.  Lab Results  Component Value Date   WBC 6.7 05/08/2014   HGB 12.2* 05/08/2014   HCT 35.5* 05/08/2014   MCV 82.4 05/08/2014   PLT 212 05/08/2014   Lab Results  Component Value Date   CREATININE 0.81 05/10/2014   BUN 7 05/10/2014   NA 143 05/10/2014   K 4.2 05/10/2014   CL 101 05/10/2014   CO2 31 05/10/2014    Lab Results  Component Value Date   HGBA1C 11.9* 05/07/2014   Lipid Panel     Component Value Date/Time   CHOL 132 12/26/2011 0934   TRIG 79.0 12/26/2011 0934   HDL 38.20* 12/26/2011 0934   CHOLHDL 3 12/26/2011 0934   VLDL 15.8 12/26/2011 0934   LDLCALC 78 12/26/2011 0934       Assessment and plan:   Delante was seen today for establish care.  Diagnoses and associated orders for this visit:  Poorly controlled type 2 diabetes mellitus  - Amb Referral to Nutrition and Diabetic E - Microalbumin, urine - Lipid panel; Future - PSA; Future - Will begin once he runs out of 70/30, Insulin Glargine (TOUJEO SOLOSTAR) 300 UNIT/ML SOPN; Inject 10 Units into the skin at bedtime.  Switched due  to cost.  Unable to afford 70/30.   Essential hypertension, benign - Begin lisinopril (PRINIVIL,ZESTRIL) 10 MG tablet; Take 1 tablet (10 mg total) by mouth daily. For blood pressure  Explained to patient diet modifications that may contribute to elevated BP. Patient will avoid foods that are high in sodium such as  canned soups and vegetables, tomato juice, commercial baked goods, commercially prepared frozen or canned entrees and sauces. Avoid salty snacks, added salt when cooking, and substituting for low sodium herbs or spices Explained exercise regimen of cardio at least three times weekly to help lower BP and cholesterol  Atherosclerosis of native coronary artery of native heart without  angina pectoris - aspirin EC 81 MG tablet; Take 1 tablet (81 mg total) by mouth daily. - atorvastatin (LIPITOR) 40 MG tablet; Take 1 tablet (40 mg total) by mouth daily. For cholesterol Education provided on proper lifestyle changes in order to lower cholesterol. Patient advised to maintain healthy weight and to keep total fat intake at 25-35% of total calories and carbohydrates 50-60% of total daily calories. Explained how high cholesterol places patient at risk for heart disease. Patient placed on appropriate medication and repeat labs in 6 months   Need for prophylactic vaccination and inoculation against influenza Influenza injection received.  Explained side effects and contraindications to patient. Information sheet given to patient.  Need for prophylactic vaccination against Streptococcus pneumoniae (pneumococcus) - Pneumococcal polysaccharide vaccine 23-valent greater than or equal to 2yo subcutaneous/IM    Patient will be switched to Toujeo 10 units each night due to cost---Nurse will show patient how to use new insulin. Nurse will also look over patients blood sugar log and have him to come back 2 weeks after beginning new insulin regimen  Return in about 1 week (around 05/26/2014) for Lab  Visit.  The patient was given clear instructions to go to ER or return to medical center if symptoms don't improve, worsen or new problems develop. The patient verbalized understanding. The patient was told to call to get lab results if they haven't heard anything in the next week.     Chari Manning, NP-C [facility] [phone] 05/19/2014, 2:22 PM

## 2014-05-19 NOTE — Patient Instructions (Addendum)
IF you begin to experience muscle aches or pain call me back, could be a symptom of the cholesterol medication  Come back in 1 week for labs, do not eat after midnight the night before

## 2014-05-20 LAB — MICROALBUMIN, URINE: Microalb, Ur: 0.7 mg/dL (ref <2.0)

## 2014-05-25 ENCOUNTER — Telehealth: Payer: Self-pay | Admitting: Internal Medicine

## 2014-05-25 NOTE — Telephone Encounter (Signed)
Nurse Nira Conn from [facility] called wanting to find out what the plan of care is for the pt. Nurse also stated that the patient is a charity case and pt. Is able to take care of him self better, nurse also stated that she would like to know if the pt. Can transition to the wound clinic or come into the office to see the doctor. Please f/u with nurse.

## 2014-05-29 ENCOUNTER — Other Ambulatory Visit: Payer: BC Managed Care – PPO

## 2014-05-30 ENCOUNTER — Encounter: Payer: Self-pay | Admitting: *Deleted

## 2014-05-30 ENCOUNTER — Telehealth: Payer: Self-pay | Admitting: Internal Medicine

## 2014-05-30 NOTE — Progress Notes (Signed)
[facility] called requesting to continue pt's wound care. Derek Lowery said the he could use a few more weeks and I gave the ok to continue his treatment.

## 2014-05-30 NOTE — Telephone Encounter (Signed)
Nurse Nira Conn from [facility] called wanting to find out what the plan of care is for the pt. Nurse also stated that the patient is a charity case and pt. Is able to take care of him self better, nurse also stated that she would like to know if the pt. Can transition to the wound clinic or come into the office to see the doctor. Please f/u with nurse.

## 2014-05-31 ENCOUNTER — Ambulatory Visit: Payer: Medicaid Other | Attending: Internal Medicine

## 2014-05-31 DIAGNOSIS — E1165 Type 2 diabetes mellitus with hyperglycemia: Secondary | ICD-10-CM

## 2014-05-31 DIAGNOSIS — E119 Type 2 diabetes mellitus without complications: Secondary | ICD-10-CM | POA: Diagnosis present

## 2014-05-31 LAB — LIPID PANEL
CHOL/HDL RATIO: 3.1 ratio
Cholesterol: 107 mg/dL (ref 0–200)
HDL: 34 mg/dL — AB (ref >39)
LDL CALC: 56 mg/dL (ref 0–99)
TRIGLYCERIDES: 83 mg/dL (ref <150)
VLDL: 17 mg/dL (ref 0–40)

## 2014-05-31 NOTE — Progress Notes (Unsigned)
Patient is here for follow up fasting blood work

## 2014-06-01 ENCOUNTER — Ambulatory Visit: Payer: Self-pay | Attending: Internal Medicine

## 2014-06-01 VITALS — BP 126/70 | HR 76 | Resp 16

## 2014-06-01 DIAGNOSIS — E119 Type 2 diabetes mellitus without complications: Secondary | ICD-10-CM

## 2014-06-01 LAB — PSA: PSA: 2.3 ng/mL (ref <=4.00)

## 2014-06-01 LAB — GLUCOSE, POCT (MANUAL RESULT ENTRY): POC Glucose: 215 mg/dl — AB (ref 70–99)

## 2014-06-01 NOTE — Progress Notes (Unsigned)
Patient ID: Derek Lowery, male   DOB: 1955/10/18, 58 y.o.   MRN: [id number] Pt comes in for insulin teaching with demonstration after starting new insulin Toujeo Pen Pt is complaint with taking medication daily States he used to use Lantus pen  Noticed tremors in left hand with return/demo insulin  Instructed pt to screw entire pen cap off before next use  Instructed how to dial 10 units with injection site to use with rotation to prevent soreness and bruising Pt verbalized understanding with return demo CBG- 215 states he didn't take insulin today BP stable, c/o blurry vision  Opthalmology referral placed  Diabetes education literature provided with visit Pt instructed to return in 1 week for f/u nurse visit

## 2014-06-01 NOTE — Patient Instructions (Addendum)
Return in 1 week for nurse visit ,CBG log,Blood pressureDiabetes Mellitus and Food It is important for you to manage your blood sugar (glucose) level. Your blood glucose level can be greatly affected by what you eat. Eating healthier foods in the appropriate amounts throughout the day at about the same time each day will help you control your blood glucose level. It can also help slow or prevent worsening of your diabetes mellitus. Healthy eating may even help you improve the level of your blood pressure and reach or maintain a healthy weight.  HOW CAN FOOD AFFECT ME? Carbohydrates Carbohydrates affect your blood glucose level more than any other type of food. Your dietitian will help you determine how many carbohydrates to eat at each meal and teach you how to count carbohydrates. Counting carbohydrates is important to keep your blood glucose at a healthy level, especially if you are using insulin or taking certain medicines for diabetes mellitus. Alcohol Alcohol can cause sudden decreases in blood glucose (hypoglycemia), especially if you use insulin or take certain medicines for diabetes mellitus. Hypoglycemia can be a life-threatening condition. Symptoms of hypoglycemia (sleepiness, dizziness, and disorientation) are similar to symptoms of having too much alcohol.  If your health care provider has given you approval to drink alcohol, do so in moderation and use the following guidelines:  Women should not have more than one drink per day, and men should not have more than two drinks per day. One drink is equal to:  12 oz of beer.  5 oz of wine.  1 oz of hard liquor.  Do not drink on an empty stomach.  Keep yourself hydrated. Have water, diet soda, or unsweetened iced tea.  Regular soda, juice, and other mixers might contain a lot of carbohydrates and should be counted. WHAT FOODS ARE NOT RECOMMENDED? As you make food choices, it is important to remember that all foods are not the same.  Some foods have fewer nutrients per serving than other foods, even though they might have the same number of calories or carbohydrates. It is difficult to get your body what it needs when you eat foods with fewer nutrients. Examples of foods that you should avoid that are high in calories and carbohydrates but low in nutrients include:  Trans fats (most processed foods list trans fats on the Nutrition Facts label).  Regular soda.  Juice.  Candy.  Sweets, such as cake, pie, doughnuts, and cookies.  Fried foods. WHAT FOODS CAN I EAT? Have nutrient-rich foods, which will nourish your body and keep you healthy. The food you should eat also will depend on several factors, including:  The calories you need.  The medicines you take.  Your weight.  Your blood glucose level.  Your blood pressure level.  Your cholesterol level. You also should eat a variety of foods, including:  Protein, such as meat, poultry, fish, tofu, nuts, and seeds (lean animal proteins are best).  Fruits.  Vegetables.  Dairy products, such as milk, cheese, and yogurt (low fat is best).  Breads, grains, pasta, cereal, rice, and beans.  Fats such as olive oil, trans fat-free margarine, canola oil, avocado, and olives. DOES EVERYONE WITH DIABETES MELLITUS HAVE THE SAME MEAL PLAN? Because every person with diabetes mellitus is different, there is not one meal plan that works for everyone. It is very important that you meet with a dietitian who will help you create a meal plan that is just right for you. Document Released: 05/01/2005 Document Revised: 08/09/2013 Document  Reviewed: 07/01/2013 ExitCare Patient Information 2015 [place], [place]. This information is not intended to replace advice given to you by your health care provider. Make sure you discuss any questions you have with your health care provider.

## 2014-06-06 ENCOUNTER — Telehealth: Payer: Self-pay | Admitting: *Deleted

## 2014-06-06 NOTE — Telephone Encounter (Signed)
Unable to contact Pt. No answer

## 2014-06-06 NOTE — Telephone Encounter (Signed)
Message copied by Betti Cruz on Tue Jun 06, 2014  2:37 PM ------      Message from: Chari Manning A      Created: Tue Jun 06, 2014  2:16 PM       Labs are normal ------

## 2014-06-08 ENCOUNTER — Ambulatory Visit: Payer: BC Managed Care – PPO | Attending: Internal Medicine

## 2014-06-08 VITALS — BP 133/78 | HR 75 | Temp 98.6°F | Resp 20 | Ht 70.0 in | Wt 208.0 lb

## 2014-06-08 DIAGNOSIS — E1310 Other specified diabetes mellitus with ketoacidosis without coma: Secondary | ICD-10-CM

## 2014-06-08 DIAGNOSIS — E1110 Type 2 diabetes mellitus with ketoacidosis without coma: Secondary | ICD-10-CM

## 2014-06-08 LAB — GLUCOSE, POCT (MANUAL RESULT ENTRY): POC Glucose: 180 mg/dl — AB (ref 70–99)

## 2014-06-08 NOTE — Progress Notes (Unsigned)
Pt comes in for repeat blood sugar with log after starting on new insulin Toujeo to inject 10 units at bedtime  Pt is compliant with insulin pen Denies blurred vision,dizziness or headaches CBg- 180. Results given to [facility] for adjustments

## 2014-06-08 NOTE — Patient Instructions (Signed)
Begin injecting 15 units into the skin @ bedtime Continue with home blood sugar lod and return in 2 weeks for nurse visit

## 2014-06-22 ENCOUNTER — Encounter: Payer: BC Managed Care – PPO | Attending: Internal Medicine

## 2014-06-22 ENCOUNTER — Other Ambulatory Visit: Payer: BC Managed Care – PPO

## 2014-06-27 ENCOUNTER — Telehealth: Payer: Self-pay | Admitting: Internal Medicine

## 2014-06-27 NOTE — Telephone Encounter (Signed)
Nurse from [facility] called stating that she recommends to discharge pt due to non compliance, nurse also states that pt. Has reached his goals and verbalized understanding of diet regimen, pt also reports knowledge of non compliance by choice. Nurse also states that  He has one strip left to check blood sugar levels and would like to get a refill. Nurse states that pt. Missed his dietician consult last week for eating habits, also that his wound is almost healed and has no signs or symptoms of infection. Please f/u with nurse.

## 2014-06-29 ENCOUNTER — Ambulatory Visit: Payer: BC Managed Care – PPO

## 2014-06-30 ENCOUNTER — Ambulatory Visit: Payer: Medicaid Other | Attending: Internal Medicine | Admitting: *Deleted

## 2014-06-30 VITALS — BP 103/64 | HR 83 | Temp 98.1°F

## 2014-06-30 DIAGNOSIS — E1165 Type 2 diabetes mellitus with hyperglycemia: Secondary | ICD-10-CM

## 2014-06-30 DIAGNOSIS — Z5181 Encounter for therapeutic drug level monitoring: Secondary | ICD-10-CM | POA: Insufficient documentation

## 2014-06-30 DIAGNOSIS — Z794 Long term (current) use of insulin: Secondary | ICD-10-CM | POA: Insufficient documentation

## 2014-06-30 LAB — GLUCOSE, POCT (MANUAL RESULT ENTRY): POC GLUCOSE: 270 mg/dL — AB (ref 70–99)

## 2014-06-30 MED ORDER — INSULIN GLARGINE 300 UNIT/ML ~~LOC~~ SOPN: 20.0000 [IU] | PEN_INJECTOR | Freq: Every day | SUBCUTANEOUS | Status: DC

## 2014-06-30 NOTE — Progress Notes (Unsigned)
Patient presents for CBG check and record review following increase of Toujeo to 15 units qHS on 06/08/14 AM fasting CBGs ranging 161 to 240 PM CBGs ranging  176 to 315 Patient states that on 06/22/14 he decided to increase Toujeo to 20 units qHS Patient instructed on the importance of calling office before making med changes in future. Patient states that he will not adjust insulin dosage in future without instructions from PCP Since increase to 20 units AM CBGs ranging 163 to 269 and  PM CBGs ranging 212 to 346 Patient reports walking 15 minutes every day Patient reports taking lisinopril daily at 5 PM  CBG 270 No insulin given per PCP BP 103/64 P 83 T 98.1 oral SPO2 97%  Patient's hands tremble. Practice pen used to demonstrate proper usage of priming pen needle and dialing pen to 20 units Patient return demonstrated correctly  Per PCP: Continue Toujeo 20 units qHS Return in 2 weeks for CBG check and bring insulin pen to demonstrate proper usage

## 2014-07-06 ENCOUNTER — Ambulatory Visit: Payer: BC Managed Care – PPO

## 2014-07-21 ENCOUNTER — Other Ambulatory Visit: Payer: BC Managed Care – PPO

## 2014-07-26 ENCOUNTER — Encounter (HOSPITAL_COMMUNITY): Payer: Self-pay | Admitting: Cardiology

## 2014-07-28 ENCOUNTER — Other Ambulatory Visit: Payer: Self-pay | Admitting: Emergency Medicine

## 2014-09-01 ENCOUNTER — Encounter: Payer: Self-pay | Admitting: Internal Medicine

## 2014-09-01 ENCOUNTER — Ambulatory Visit: Payer: Medicaid Other | Attending: Internal Medicine | Admitting: Internal Medicine

## 2014-09-01 VITALS — BP 127/80 | HR 119 | Temp 98.3°F | Resp 15 | Ht 70.0 in | Wt 194.0 lb

## 2014-09-01 DIAGNOSIS — Z794 Long term (current) use of insulin: Secondary | ICD-10-CM | POA: Insufficient documentation

## 2014-09-01 DIAGNOSIS — E119 Type 2 diabetes mellitus without complications: Secondary | ICD-10-CM

## 2014-09-01 DIAGNOSIS — I10 Essential (primary) hypertension: Secondary | ICD-10-CM | POA: Insufficient documentation

## 2014-09-01 DIAGNOSIS — Z79899 Other long term (current) drug therapy: Secondary | ICD-10-CM | POA: Diagnosis not present

## 2014-09-01 DIAGNOSIS — R739 Hyperglycemia, unspecified: Secondary | ICD-10-CM

## 2014-09-01 DIAGNOSIS — I2510 Atherosclerotic heart disease of native coronary artery without angina pectoris: Secondary | ICD-10-CM | POA: Diagnosis not present

## 2014-09-01 DIAGNOSIS — E1165 Type 2 diabetes mellitus with hyperglycemia: Secondary | ICD-10-CM | POA: Diagnosis present

## 2014-09-01 DIAGNOSIS — M79671 Pain in right foot: Secondary | ICD-10-CM | POA: Diagnosis not present

## 2014-09-01 DIAGNOSIS — H409 Unspecified glaucoma: Secondary | ICD-10-CM | POA: Diagnosis not present

## 2014-09-01 DIAGNOSIS — Z7982 Long term (current) use of aspirin: Secondary | ICD-10-CM | POA: Insufficient documentation

## 2014-09-01 DIAGNOSIS — E780 Pure hypercholesterolemia: Secondary | ICD-10-CM | POA: Diagnosis not present

## 2014-09-01 DIAGNOSIS — Z9114 Patient's other noncompliance with medication regimen: Secondary | ICD-10-CM | POA: Diagnosis not present

## 2014-09-01 LAB — POCT URINALYSIS DIPSTICK
Bilirubin, UA: NEGATIVE
GLUCOSE UA: 500
Ketones, UA: NEGATIVE
NITRITE UA: NEGATIVE
Protein, UA: NEGATIVE
Spec Grav, UA: 1.015
UROBILINOGEN UA: 1
pH, UA: 6

## 2014-09-01 LAB — GLUCOSE, POCT (MANUAL RESULT ENTRY)
POC GLUCOSE: 373 mg/dL — AB (ref 70–99)
POC Glucose: 286 mg/dl — AB (ref 70–99)

## 2014-09-01 LAB — POCT GLYCOSYLATED HEMOGLOBIN (HGB A1C): Hemoglobin A1C: 10.7

## 2014-09-01 MED ORDER — INSULIN GLARGINE 100 UNIT/ML SOLOSTAR PEN: 10.0000 [IU] | PEN_INJECTOR | Freq: Every day | SUBCUTANEOUS | Status: DC

## 2014-09-01 NOTE — Patient Instructions (Signed)
Diabetes and Foot Care Diabetes may cause you to have problems because of poor blood supply (circulation) to your feet and legs. This may cause the skin on your feet to become thinner, break easier, and heal more slowly. Your skin may become dry, and the skin may peel and crack. You may also have nerve damage in your legs and feet causing decreased feeling in them. You may not notice minor injuries to your feet that could lead to infections or more serious problems. Taking care of your feet is one of the most important things you can do for yourself.  HOME CARE INSTRUCTIONS  Wear shoes at all times, even in the house. Do not go barefoot. Bare feet are easily injured.  Check your feet daily for blisters, cuts, and redness. If you cannot see the bottom of your feet, use a mirror or ask someone for help.  Wash your feet with warm water (do not use hot water) and mild soap. Then pat your feet and the areas between your toes until they are completely dry. Do not soak your feet as this can dry your skin.  Apply a moisturizing lotion or petroleum jelly (that does not contain alcohol and is unscented) to the skin on your feet and to dry, brittle toenails. Do not apply lotion between your toes.  Trim your toenails straight across. Do not dig under them or around the cuticle. File the edges of your nails with an emery board or nail file.  Do not cut corns or calluses or try to remove them with medicine.  Wear clean socks or stockings every day. Make sure they are not too tight. Do not wear knee-high stockings since they may decrease blood flow to your legs.  Wear shoes that fit properly and have enough cushioning. To break in new shoes, wear them for just a few hours a day. This prevents you from injuring your feet. Always look in your shoes before you put them on to be sure there are no objects inside.  Do not cross your legs. This may decrease the blood flow to your feet.  If you find a minor scrape,  cut, or break in the skin on your feet, keep it and the skin around it clean and dry. These areas may be cleansed with mild soap and water. Do not cleanse the area with peroxide, alcohol, or iodine.  When you remove an adhesive bandage, be sure not to damage the skin around it.  If you have a wound, look at it several times a day to make sure it is healing.  Do not use heating pads or hot water bottles. They may burn your skin. If you have lost feeling in your feet or legs, you may not know it is happening until it is too late.  Make sure your health care provider performs a complete foot exam at least annually or more often if you have foot problems. Report any cuts, sores, or bruises to your health care provider immediately. SEEK MEDICAL CARE IF:   You have an injury that is not healing.  You have cuts or breaks in the skin.  You have an ingrown nail.  You notice redness on your legs or feet.  You feel burning or tingling in your legs or feet.  You have pain or cramps in your legs and feet.  Your legs or feet are numb.  Your feet always feel cold. SEEK IMMEDIATE MEDICAL CARE IF:   There is increasing redness,   swelling, or pain in or around a wound.  There is a red line that goes up your leg.  Pus is coming from a wound.  You develop a fever or as directed by your health care provider.  You notice a bad smell coming from an ulcer or wound. Document Released: 08/01/2000 Document Revised: 04/06/2013 Document Reviewed: 01/11/2013 Exit[employer] Patient Information 2015 [employer]. This information is not intended to replace advice given to you by your health care provider. Make sure you discuss any questions you have with your health care provider.  

## 2014-09-01 NOTE — Progress Notes (Signed)
Patient ID: Derek Lowery, male   DOB: Sep 30, 1955, 59 y.o.   MRN: [id number]  CC: DM  HPI: Derek Lowery is a 59 y.o. male here today for a follow up visit.  Patient has past medical history of of T2DM, HLD, HTN, CAD, and glaucoma. He reports that he was once was on Toujeo and now has been switched to Lantus.  He has only been taking 10 units of Lantus per night but has poor vision so he has been unable to maintain his log. He states that his sister gives him his insulin due to his poor vision.  He reports more pain in his bilateral feet.  The pain is worse if something touches the side of his feet. The pain is described  as a numb burning sensation.  The pain is mainly at night.  Been having cough, white mucous production, and runny nose for the past two months.  He denies fevers, chills, sore throat, or headache.   No Known Allergies Past Medical History  Diagnosis Date  . Hypercholesteremia   . Type 2 diabetes mellitus 1990  . Internal hemorrhoids   . Essential hypertension, benign   . Noncompliance   . CAD (coronary artery disease)     NSTEMI 06/2011:  LHC 07/18/11: Proximal diagonal 60%, distal LAD with a diabetic appearance and 60% stenosis, OM2 with an occluded superior branch and an inferior branch with 90%, EF 55% with inferior hypokinesis.  PCI: Promus DES to the OM2 inferior branch.  This vessel provides collaterals to the superior branch which remained occluded.  Echocardiogram 07/18/11: EF 60%, normal wall motion.  . Glaucoma    Current Outpatient Prescriptions on File Prior to Visit  Medication Sig Dispense Refill  . aspirin EC 81 MG tablet Take 1 tablet (81 mg total) by mouth daily. 30 tablet 4  . atorvastatin (LIPITOR) 40 MG tablet Take 1 tablet (40 mg total) by mouth daily. For cholesterol 30 tablet 4  . glucose monitoring kit (FREESTYLE) monitoring kit 1 each by Does not apply route as needed for other. 1 each 0  . Insulin Syringe-Needle U-100 (SAFETY-GLIDE 0.3CC SYR  29GX1/2) 29G X 1/2" 0.3 ML MISC Use as directed. 200 each 0  . lisinopril (PRINIVIL,ZESTRIL) 10 MG tablet Take 1 tablet (10 mg total) by mouth daily. For blood pressure 30 tablet 4  . vitamin C (VITAMIN C) 500 MG tablet Take 1 tablet (500 mg total) by mouth 2 (two) times daily.     No current facility-administered medications on file prior to visit.   Family History  Problem Relation Age of Onset  . Coronary artery disease Father     Developed in his 82s  . Kidney disease Father   . Diabetes Father   . Heart failure Mother   . Hypertension Mother   . Diabetes Sister   . Diabetes Brother   . Melanoma Father    History   Social History  . Marital Status: Single    Spouse Name: N/A    Number of Children: N/A  . Years of Education: N/A   Occupational History  . Janitor    Social History Main Topics  . Smoking status: Never Smoker   . Smokeless tobacco: Never Used  . Alcohol Use: No  . Drug Use: No  . Sexual Activity: No   Other Topics Concern  . Not on file   Social History Narrative   Works at [employer]    Review of  Systems: See HPI   Objective:   Filed Vitals:   09/01/14 1527  BP: 127/80  Pulse: 119  Temp: 98.3 F (36.8 C)  Resp: 15    Physical Exam  HENT:  Right Ear: External ear normal.  Left Ear: External ear normal.  Mouth/Throat: Oropharynx is clear and moist.  Eyes: EOM are normal. Pupils are equal, round, and reactive to light.  Cardiovascular: Normal rate, regular rhythm and normal heart sounds.   Pulmonary/Chest: Effort normal and breath sounds normal.  Feet:  Right Foot:  Skin Integrity: Negative for skin breakdown.  Left Foot:  Skin Integrity: Negative for skin breakdown.  Lymphadenopathy:    He has no cervical adenopathy.  Skin: Skin is warm and dry.     Lab Results  Component Value Date   WBC 6.7 05/08/2014   HGB 12.2* 05/08/2014   HCT 35.5* 05/08/2014   MCV 82.4 05/08/2014   PLT 212 05/08/2014    Lab Results  Component Value Date   CREATININE 0.81 05/10/2014   BUN 7 05/10/2014   NA 143 05/10/2014   K 4.2 05/10/2014   CL 101 05/10/2014   CO2 31 05/10/2014    Lab Results  Component Value Date   HGBA1C 11.9* 05/07/2014   Lipid Panel     Component Value Date/Time   CHOL 107 05/31/2014 0917   TRIG 83 05/31/2014 0917   HDL 34* 05/31/2014 0917   CHOLHDL 3.1 05/31/2014 0917   VLDL 17 05/31/2014 0917   LDLCALC 56 05/31/2014 0917       Assessment and plan:   [name] was seen today for follow-up.  Diagnoses and associated orders for this visit:  Type 2 diabetes mellitus without complication - Glucose (CBG) - HgB A1c - Refill Insulin Glargine (LANTUS) 100 UNIT/ML Solostar Pen; Inject 15 Units into the skin daily at 10 pm. - Ambulatory referral to Podiatry - Glucose (CBG) Patients diabetes remains uncontrolled as evidence by hemoglobin a1c >8.  Unsure of patient compliance due to other co-morbidities. Stressed the multiple complications associated with uncontrolled diabetes.  Patient will stay on current medication dose and report back to clinic with cbg log in 2 weeks.  Hyperglycemia - POCT urinalysis dipstick    Return in about 2 weeks (around 09/15/2014) for and 4 week RN visit-CBG log and 3 mo PCP. May need further increase in insulin if BS stays elevated.      Chari Manning, NP-C [facility] [phone] 09/01/2014, 4:06 PM

## 2014-09-01 NOTE — Progress Notes (Signed)
Pt is here following up on his HTN, diabetes and his hyperlipidemia.  Pt states that his feet hurt and at night it keeps him up.

## 2014-09-03 ENCOUNTER — Other Ambulatory Visit: Payer: Self-pay | Admitting: Internal Medicine

## 2014-09-03 DIAGNOSIS — G629 Polyneuropathy, unspecified: Secondary | ICD-10-CM

## 2014-09-03 MED ORDER — GABAPENTIN 300 MG PO CAPS: 300.0000 mg | ORAL_CAPSULE | Freq: Every day | ORAL | Status: DC

## 2014-09-14 ENCOUNTER — Ambulatory Visit: Payer: Medicaid Other | Attending: Internal Medicine | Admitting: *Deleted

## 2014-09-14 VITALS — BP 97/62 | HR 81 | Temp 98.0°F | Resp 16

## 2014-09-14 DIAGNOSIS — Z794 Long term (current) use of insulin: Secondary | ICD-10-CM | POA: Diagnosis not present

## 2014-09-14 DIAGNOSIS — R739 Hyperglycemia, unspecified: Secondary | ICD-10-CM

## 2014-09-14 DIAGNOSIS — Z9119 Patient's noncompliance with other medical treatment and regimen: Secondary | ICD-10-CM | POA: Diagnosis not present

## 2014-09-14 DIAGNOSIS — E119 Type 2 diabetes mellitus without complications: Secondary | ICD-10-CM | POA: Diagnosis present

## 2014-09-14 DIAGNOSIS — E1165 Type 2 diabetes mellitus with hyperglycemia: Secondary | ICD-10-CM

## 2014-09-14 LAB — GLUCOSE, POCT (MANUAL RESULT ENTRY)
POC Glucose: 363 mg/dl — AB (ref 70–99)
POC Glucose: 361 mg/dl — AB (ref 70–99)

## 2014-09-14 LAB — POCT URINALYSIS DIPSTICK
Bilirubin, UA: NEGATIVE
Glucose, UA: 500
KETONES UA: NEGATIVE
Nitrite, UA: NEGATIVE
PROTEIN UA: NEGATIVE
SPEC GRAV UA: 1.015
UROBILINOGEN UA: 0.2
pH, UA: 5.5

## 2014-09-14 MED ORDER — INSULIN ASPART 100 UNIT/ML ~~LOC~~ SOLN
20.0000 [IU] | Freq: Once | SUBCUTANEOUS | Status: AC
  Administered 2014-09-14: 20 [IU] via SUBCUTANEOUS

## 2014-09-14 MED ORDER — INSULIN GLARGINE 100 UNIT/ML SOLOSTAR PEN: 20.0000 [IU] | PEN_INJECTOR | Freq: Every day | SUBCUTANEOUS | Status: DC

## 2014-09-14 NOTE — Progress Notes (Signed)
Patient presents for CBG and record review Med list reviewed; patient reports taking all meds as directed Patient's AM fasting blood sugars ranging 243-384 Patient only checks BS one time per day  BP 97/62 P 81 R 16 T 98.0 oral SpO2 98%  CBG 363 20 units novolog insulin given per protocol  CBG 361 30 minutes after insulin admin   U/A: Glucose 500 Blood Small Leukocytes Small Ketone Negative  Per PCP: Increase lantus to 20 units q HS Return in 1 week for nurse visit for CBG and record review

## 2014-09-22 ENCOUNTER — Ambulatory Visit: Payer: Medicaid Other | Attending: Internal Medicine | Admitting: *Deleted

## 2014-09-22 VITALS — BP 112/64 | HR 68 | Temp 98.4°F | Resp 18

## 2014-09-22 DIAGNOSIS — E119 Type 2 diabetes mellitus without complications: Secondary | ICD-10-CM

## 2014-09-22 DIAGNOSIS — I10 Essential (primary) hypertension: Secondary | ICD-10-CM | POA: Diagnosis not present

## 2014-09-22 DIAGNOSIS — E785 Hyperlipidemia, unspecified: Secondary | ICD-10-CM | POA: Diagnosis not present

## 2014-09-22 DIAGNOSIS — R739 Hyperglycemia, unspecified: Secondary | ICD-10-CM

## 2014-09-22 LAB — GLUCOSE, POCT (MANUAL RESULT ENTRY)
POC Glucose: 334 mg/dl — AB (ref 70–99)
POC Glucose: 320 mg/dl — AB (ref 70–99)

## 2014-09-22 MED ORDER — INSULIN ASPART 100 UNIT/ML ~~LOC~~ SOLN
10.0000 [IU] | Freq: Once | SUBCUTANEOUS | Status: AC
  Administered 2014-09-22: 10 [IU] via SUBCUTANEOUS

## 2014-09-22 MED ORDER — GLUCOSE BLOOD VI STRP: ORAL_STRIP | Status: DC

## 2014-09-22 MED ORDER — INSULIN GLARGINE 100 UNIT/ML SOLOSTAR PEN: 25.0000 [IU] | PEN_INJECTOR | Freq: Every day | SUBCUTANEOUS | Status: DC

## 2014-09-22 NOTE — Progress Notes (Signed)
Patient presents for CBG and record review Med list reviewed; patient reports taking all meds as directed Patient's AM fasting blood sugars ranging 243-400 Patient states he has not been eating right or Checking BS as soften as he should. States sister has been helping him  CBG 334 10 units novolog insulin given SQ per protocol  BP 112/64 P 68 R 18 T 98.4 oral SpO2 98%  Per PCP: Increase lantus to 25 units qHS Better diet control; The Plate Method discussed  Sister brought in to review insulin dosing as she prepares med for patient. Reviewed correct procedure for administering inulin via pen. Had not been priming needle prior to dosing. Patient and sister shown how to prime needle. And to count to 10 prior to removing needle from skin once insulin administered  Patient will return in 1 week with CBG log for review  Patient given literature on Diabetes and Food, Diabetes and Exercise, and Basic Carb Counting  Patient advised to call for med refills at least 7 days before running out so as not to go without.  CBG 320 30 minutes after insulin admin. Patient discharged to home in stable condition.

## 2014-09-22 NOTE — Patient Instructions (Signed)
Diabetes Mellitus and Food It is important for you to manage your blood sugar (glucose) level. Your blood glucose level can be greatly affected by what you eat. Eating healthier foods in the appropriate amounts throughout the day at about the same time each day will help you control your blood glucose level. It can also help slow or prevent worsening of your diabetes mellitus. Healthy eating may even help you improve the level of your blood pressure and reach or maintain a healthy weight.  HOW CAN FOOD AFFECT ME? Carbohydrates Carbohydrates affect your blood glucose level more than any other type of food. Your dietitian will help you determine how many carbohydrates to eat at each meal and teach you how to count carbohydrates. Counting carbohydrates is important to keep your blood glucose at a healthy level, especially if you are using insulin or taking certain medicines for diabetes mellitus. Alcohol Alcohol can cause sudden decreases in blood glucose (hypoglycemia), especially if you use insulin or take certain medicines for diabetes mellitus. Hypoglycemia can be a life-threatening condition. Symptoms of hypoglycemia (sleepiness, dizziness, and disorientation) are similar to symptoms of having too much alcohol.  If your health care provider has given you approval to drink alcohol, do so in moderation and use the following guidelines:  Women should not have more than one drink per day, and men should not have more than two drinks per day. One drink is equal to:  12 oz of beer.  5 oz of wine.  1 oz of hard liquor.  Do not drink on an empty stomach.  Keep yourself hydrated. Have water, diet soda, or unsweetened iced tea.  Regular soda, juice, and other mixers might contain a lot of carbohydrates and should be counted. WHAT FOODS ARE NOT RECOMMENDED? As you make food choices, it is important to remember that all foods are not the same. Some foods have fewer nutrients per serving than other  foods, even though they might have the same number of calories or carbohydrates. It is difficult to get your body what it needs when you eat foods with fewer nutrients. Examples of foods that you should avoid that are high in calories and carbohydrates but low in nutrients include:  Trans fats (most processed foods list trans fats on the Nutrition Facts label).  Regular soda.  Juice.  Candy.  Sweets, such as cake, pie, doughnuts, and cookies.  Fried foods. WHAT FOODS CAN I EAT? Have nutrient-rich foods, which will nourish your body and keep you healthy. The food you should eat also will depend on several factors, including:  The calories you need.  The medicines you take.  Your weight.  Your blood glucose level.  Your blood pressure level.  Your cholesterol level. You also should eat a variety of foods, including:  Protein, such as meat, poultry, fish, tofu, nuts, and seeds (lean animal proteins are best).  Fruits.  Vegetables.  Dairy products, such as milk, cheese, and yogurt (low fat is best).  Breads, grains, pasta, cereal, rice, and beans.  Fats such as olive oil, trans fat-free margarine, canola oil, avocado, and olives. DOES EVERYONE WITH DIABETES MELLITUS HAVE THE SAME MEAL PLAN? Because every person with diabetes mellitus is different, there is not one meal plan that works for everyone. It is very important that you meet with a dietitian who will help you create a meal plan that is just right for you. Document Released: 05/01/2005 Document Revised: 08/09/2013 Document Reviewed: 07/01/2013 [facility] Patient Information 2015 [place], [place]. This  information is not intended to replace advice given to you by your health care provider. Make sure you discuss any questions you have with your health care provider. Diabetes and Exercise Exercising regularly is important. It is not just about losing weight. It has many health benefits, such as:  Improving your overall  fitness, flexibility, and endurance.  Increasing your bone density.  Helping with weight control.  Decreasing your body fat.  Increasing your muscle strength.  Reducing stress and tension.  Improving your overall health. People with diabetes who exercise gain additional benefits because exercise:  Reduces appetite.  Improves the body's use of blood sugar (glucose).  Helps lower or control blood glucose.  Decreases blood pressure.  Helps control blood lipids (such as cholesterol and triglycerides).  Improves the body's use of the hormone insulin by:  Increasing the body's insulin sensitivity.  Reducing the body's insulin needs.  Decreases the risk for heart disease because exercising:  Lowers cholesterol and triglycerides levels.  Increases the levels of good cholesterol (such as high-density lipoproteins [HDL]) in the body.  Lowers blood glucose levels. YOUR ACTIVITY PLAN  Choose an activity that you enjoy and set realistic goals. Your health care provider or diabetes educator can help you make an activity plan that works for you. Exercise regularly as directed by your health care provider. This includes:  Performing resistance training twice a week such as push-ups, sit-ups, lifting weights, or using resistance bands.  Performing 150 minutes of cardio exercises each week such as walking, running, or playing sports.  Staying active and spending no more than 90 minutes at one time being inactive. Even short bursts of exercise are good for you. Three 10-minute sessions spread throughout the day are just as beneficial as a single 30-minute session. Some exercise ideas include:  Taking the dog for a walk.  Taking the stairs instead of the elevator.  Dancing to your favorite song.  Doing an exercise video.  Doing your favorite exercise with a friend. RECOMMENDATIONS FOR EXERCISING WITH TYPE 1 OR TYPE 2 DIABETES   Check your blood glucose before exercising. If  blood glucose levels are greater than 240 mg/dL, check for urine ketones. Do not exercise if ketones are present.  Avoid injecting insulin into areas of the body that are going to be exercised. For example, avoid injecting insulin into:  The arms when playing tennis.  The legs when jogging.  Keep a record of:  Food intake before and after you exercise.  Expected peak times of insulin action.  Blood glucose levels before and after you exercise.  The type and amount of exercise you have done.  Review your records with your health care provider. Your health care provider will help you to develop guidelines for adjusting food intake and insulin amounts before and after exercising.  If you take insulin or oral hypoglycemic agents, watch for signs and symptoms of hypoglycemia. They include:  Dizziness.  Shaking.  Sweating.  Chills.  Confusion.  Drink plenty of water while you exercise to prevent dehydration or heat stroke. Body water is lost during exercise and must be replaced.  Talk to your health care provider before starting an exercise program to make sure it is safe for you. Remember, almost any type of activity is better than none. Document Released: 10/25/2003 Document Revised: 12/19/2013 Document Reviewed: 01/11/2013 [facility] Patient Information 2015 [place], [place]. This information is not intended to replace advice given to you by your health care provider. Make sure you discuss any  questions you have with your health care provider. Basic Carbohydrate Counting for Diabetes Mellitus Carbohydrate counting is a method for keeping track of the amount of carbohydrates you eat. Eating carbohydrates naturally increases the level of sugar (glucose) in your blood, so it is important for you to know the amount that is okay for you to have in every meal. Carbohydrate counting helps keep the level of glucose in your blood within normal limits. The amount of carbohydrates allowed is  different for every person. A dietitian can help you calculate the amount that is right for you. Once you know the amount of carbohydrates you can have, you can count the carbohydrates in the foods you want to eat. Carbohydrates are found in the following foods:  Grains, such as breads and cereals.  Dried beans and soy products.  Starchy vegetables, such as potatoes, peas, and corn.  Fruit and fruit juices.  Milk and yogurt.  Sweets and snack foods, such as cake, cookies, candy, chips, soft drinks, and fruit drinks. CARBOHYDRATE COUNTING There are two ways to count the carbohydrates in your food. You can use either of the methods or a combination of both. Reading the "Nutrition Facts" on [place] The "Nutrition Facts" is an area that is included on the labels of almost all packaged food and beverages in the [place]. It includes the serving size of that food or beverage and information about the nutrients in each serving of the food, including the grams (g) of carbohydrate per serving.  Decide the number of servings of this food or beverage that you will be able to eat or drink. Multiply that number of servings by the number of grams of carbohydrate that is listed on the label for that serving. The total will be the amount of carbohydrates you will be having when you eat or drink this food or beverage. Learning Standard Serving Sizes of Food When you eat food that is not packaged or does not include "Nutrition Facts" on the label, you need to measure the servings in order to count the amount of carbohydrates.A serving of most carbohydrate-rich foods contains about 15 g of carbohydrates. The following list includes serving sizes of carbohydrate-rich foods that provide 15 g ofcarbohydrate per serving:   1 slice of bread (1 oz) or 1 six-inch tortilla.    of a hamburger bun or English muffin.  4-6 crackers.   cup unsweetened dry cereal.    cup hot cereal.   cup rice or  pasta.    cup mashed potatoes or  of a large baked potato.  1 cup fresh fruit or one small piece of fruit.    cup canned or frozen fruit or fruit juice.  1 cup milk.   cup plain fat-free yogurt or yogurt sweetened with artificial sweeteners.   cup cooked dried beans or starchy vegetable, such as peas, corn, or potatoes.  Decide the number of standard-size servings that you will eat. Multiply that number of servings by 15 (the grams of carbohydrates in that serving). For example, if you eat 2 cups of strawberries, you will have eaten 2 servings and 30 g of carbohydrates (2 servings x 15 g = 30 g). For foods such as soups and casseroles, in which more than one food is mixed in, you will need to count the carbohydrates in each food that is included. EXAMPLE OF CARBOHYDRATE COUNTING Sample Dinner  3 oz chicken breast.   cup of brown rice.   cup of corn.  1 cup milk.   1 cup strawberries with sugar-free whipped topping.  Carbohydrate Calculation Step 1: Identify the foods that contain carbohydrates:   Rice.   Corn.   Milk.   Strawberries. Step 2:Calculate the number of servings eaten of each:   2 servings of rice.   1 serving of corn.   1 serving of milk.   1 serving of strawberries. Step 3: Multiply each of those number of servings by 15 g:   2 servings of rice x 15 g = 30 g.   1 serving of corn x 15 g = 15 g.   1 serving of milk x 15 g = 15 g.   1 serving of strawberries x 15 g = 15 g. Step 4: Add together all of the amounts to find the total grams of carbohydrates eaten: 30 g + 15 g + 15 g + 15 g = 75 g. Document Released: 08/04/2005 Document Revised: 12/19/2013 Document Reviewed: 07/01/2013 [facility] Patient Information 2015 [place], [place]. This information is not intended to replace advice given to you by your health care provider. Make sure you discuss any questions you have with your health care provider.

## 2014-09-29 ENCOUNTER — Ambulatory Visit: Payer: Medicaid Other | Attending: Internal Medicine | Admitting: *Deleted

## 2014-09-29 VITALS — BP 102/64 | HR 65 | Temp 97.8°F | Resp 16

## 2014-09-29 DIAGNOSIS — Z9119 Patient's noncompliance with other medical treatment and regimen: Secondary | ICD-10-CM | POA: Diagnosis not present

## 2014-09-29 DIAGNOSIS — E1165 Type 2 diabetes mellitus with hyperglycemia: Secondary | ICD-10-CM

## 2014-09-29 DIAGNOSIS — E119 Type 2 diabetes mellitus without complications: Secondary | ICD-10-CM | POA: Diagnosis present

## 2014-09-29 LAB — GLUCOSE, POCT (MANUAL RESULT ENTRY)
POC GLUCOSE: 272 mg/dL — AB (ref 70–99)
POC Glucose: 281 mg/dl — AB (ref 70–99)

## 2014-09-29 MED ORDER — INSULIN ASPART 100 UNIT/ML ~~LOC~~ SOLN
10.0000 [IU] | Freq: Once | SUBCUTANEOUS | Status: AC
  Administered 2014-09-29: 10 [IU] via SUBCUTANEOUS

## 2014-09-29 NOTE — Progress Notes (Signed)
Patient presents for CBG and record review after increasing lantus insulin to 25 units qHS Med list reviewed; patient reports taking all meds as directed Patient's AM fasting blood sugars ranging 223-399  CBG 281 after 2 cups of coffee with artificial sweetener 10 units novolog insulin given SQ per protocol  BP 102/64 P 65 R 16 T  97.8 oral SpO2 100%  Will discuss with PCP and call patient back  Patient advised to call for med refills at least 7 days before running out so as not to go without. Patient aware that he is to f/u with PCP 3 months from last visit. Due 12/01/2014  CBG 272 30 minutes after insulin admin. Patient discharged to home in stable condition.

## 2014-10-06 ENCOUNTER — Telehealth: Payer: Self-pay | Admitting: *Deleted

## 2014-10-06 MED ORDER — GLIPIZIDE ER 5 MG PO TB24: 5.0000 mg | ORAL_TABLET | Freq: Every day | ORAL | Status: DC

## 2014-10-06 NOTE — Telephone Encounter (Signed)
Per PCP: Continue lantus 25 units q HS Add metformin 500 mg bid. If unable to tolerate metformin give glipizide XL 5 mg daily  Patient notified. States he had severe diarrhea in past with metformin. Glipizide Rx e-scribed to [facility] Patient will pick up med and schedule 2 week appt for nurse visit for CBG and record review after starting new med

## 2014-10-17 ENCOUNTER — Other Ambulatory Visit: Payer: Self-pay | Admitting: Internal Medicine

## 2014-11-09 ENCOUNTER — Other Ambulatory Visit: Payer: Self-pay | Admitting: Internal Medicine

## 2014-11-09 DIAGNOSIS — E1165 Type 2 diabetes mellitus with hyperglycemia: Secondary | ICD-10-CM

## 2014-11-09 MED ORDER — INSULIN SYRINGE-NEEDLE U-100 29G X 1/2" 0.3 ML MISC
Status: DC
Start: 2014-11-09 — End: 2017-03-05

## 2014-11-13 ENCOUNTER — Ambulatory Visit: Payer: Medicaid Other | Attending: Internal Medicine | Admitting: *Deleted

## 2014-11-13 VITALS — BP 107/66 | HR 85 | Temp 98.0°F | Resp 18

## 2014-11-13 DIAGNOSIS — E1165 Type 2 diabetes mellitus with hyperglycemia: Secondary | ICD-10-CM | POA: Insufficient documentation

## 2014-11-13 LAB — GLUCOSE, POCT (MANUAL RESULT ENTRY)
POC GLUCOSE: 302 mg/dL — AB (ref 70–99)
POC Glucose: 274 mg/dl — AB (ref 70–99)

## 2014-11-13 MED ORDER — INSULIN ASPART 100 UNIT/ML ~~LOC~~ SOLN
10.0000 [IU] | Freq: Once | SUBCUTANEOUS | Status: AC
Start: 2014-11-13 — End: 2014-11-13
  Administered 2014-11-13: 10 [IU] via SUBCUTANEOUS

## 2014-11-13 NOTE — Progress Notes (Signed)
Patient presents for CBG and record review, however, patient did not bring log or meter States battery is dead on glucometer and hasn't checked blood sugar in 3-4 weeks Med list reviewed; patient reports taking all meds as directed  CBG 302 3 hours after 2 cups of black coffee with artificial sweetener 10 units novolog insulin given per protocol  BP 107/66 P 85 R 18 T  98.0 oral SpO2 98%  Patient will purchase battery for glucometer today and resume checking BS AM fasting and prior to dinner  Patient will have 3 month f/u with PCP in 2 weeks (Due 12/01/14) with blood sugar log  CBG 274 30 minutes after insulin admin. Patient discharged to home in stable condition.

## 2014-12-01 ENCOUNTER — Ambulatory Visit: Payer: Medicaid Other | Admitting: Internal Medicine

## 2014-12-08 ENCOUNTER — Other Ambulatory Visit: Payer: Self-pay | Admitting: Internal Medicine

## 2014-12-08 NOTE — Telephone Encounter (Signed)
Refilled glipizide for 1 month supply but instructed that patient will need to be seen before anymore refills given

## 2014-12-13 ENCOUNTER — Encounter: Payer: Self-pay | Admitting: Family Medicine

## 2014-12-13 ENCOUNTER — Ambulatory Visit: Payer: Medicaid Other | Attending: Internal Medicine | Admitting: Family Medicine

## 2014-12-13 VITALS — BP 143/79 | HR 68 | Temp 98.1°F | Resp 18 | Ht 70.0 in | Wt 200.2 lb

## 2014-12-13 DIAGNOSIS — N3289 Other specified disorders of bladder: Secondary | ICD-10-CM | POA: Diagnosis not present

## 2014-12-13 DIAGNOSIS — Z7982 Long term (current) use of aspirin: Secondary | ICD-10-CM | POA: Insufficient documentation

## 2014-12-13 DIAGNOSIS — R3915 Urgency of urination: Secondary | ICD-10-CM | POA: Diagnosis not present

## 2014-12-13 DIAGNOSIS — Z794 Long term (current) use of insulin: Secondary | ICD-10-CM | POA: Diagnosis not present

## 2014-12-13 DIAGNOSIS — E119 Type 2 diabetes mellitus without complications: Secondary | ICD-10-CM | POA: Insufficient documentation

## 2014-12-13 DIAGNOSIS — R319 Hematuria, unspecified: Secondary | ICD-10-CM | POA: Diagnosis not present

## 2014-12-13 LAB — POCT URINALYSIS DIPSTICK
BILIRUBIN UA: NEGATIVE
Glucose, UA: 500
Ketones, UA: NEGATIVE
NITRITE UA: NEGATIVE
PH UA: 6.5
PROTEIN UA: 30
Spec Grav, UA: 1.02
Urobilinogen, UA: 1

## 2014-12-13 NOTE — Patient Instructions (Signed)
Make appointment with Mrs. Derek Lowery in 2 week to see if blood in urine has resolved and for follow-up on diabetes. If urine becomes more frankly bloody, follow-up sooner.

## 2014-12-13 NOTE — Progress Notes (Addendum)
Subjective:     Patient ID: Derek Lowery, male   DOB: 12/07/1955, 59 y.o.   MRN: [id number]  HPI   Patient presents having seen blood in his urine yesterday. He did not see any today. He denies any fever, chills, frequency, urgency, or dysuria. His last PSA was about 6 months ago and was 2.30. He does have some chronic urgency related to a "weak bladder". A review of previous urinalysis shows a trace of blood and a small amount of blood in the past.     Review of Systems   See HPI     Objective:   Physical Exam   Alert, oriented, appropriate. In no distress. Lungs are clear. HS are regular. There is no tenderness over the bladder. He has a large amt of blood in urine.     Assessment:     1. Hematuria 2. Diabetes     Plan:     Am sending a urine for culture to r/o infection. He is to follow-up in 2 weeks with Mrs. Feliciana Rossetti for Diabetes follow-up and recheck of urine.  * Urine showed multiple species consistent with a non-clean catch.   Micheline Chapman, FNP-BC

## 2014-12-13 NOTE — Progress Notes (Signed)
Patient presents to clinic with 2 day history of blood in urine.  Patient indicates it occurred multiple times yesterday and once today. Last this morning. Patient denies pain with urination or trauma. He indicates he has been "sitting more than usual".

## 2014-12-15 ENCOUNTER — Telehealth: Payer: Self-pay

## 2014-12-15 LAB — URINE CULTURE: Colony Count: 15000

## 2014-12-15 NOTE — Telephone Encounter (Signed)
Per Sharon Seller, NP, urine culture showed no identified infections.  She indicates patient should follow-up with Chari Manning, NP in two weeks.  Patient called and notified of urine culture results.  Patient has appointment with Chari Manning, NP on 12/27/14. Patient reminded of appointment and instructed to return/notify clinic if symptoms worsen. Patient verbalized understanding.

## 2014-12-18 ENCOUNTER — Other Ambulatory Visit: Payer: Self-pay | Admitting: Internal Medicine

## 2014-12-20 DIAGNOSIS — H2511 Age-related nuclear cataract, right eye: Secondary | ICD-10-CM | POA: Diagnosis not present

## 2014-12-20 DIAGNOSIS — H4050X2 Glaucoma secondary to other eye disorders, unspecified eye, moderate stage: Secondary | ICD-10-CM | POA: Diagnosis not present

## 2014-12-25 ENCOUNTER — Telehealth: Payer: Self-pay | Admitting: *Deleted

## 2014-12-25 ENCOUNTER — Other Ambulatory Visit: Payer: Self-pay | Admitting: Internal Medicine

## 2014-12-25 ENCOUNTER — Telehealth: Payer: Self-pay | Admitting: Internal Medicine

## 2014-12-25 MED ORDER — LISINOPRIL 10 MG PO TABS: 10.0000 mg | ORAL_TABLET | Freq: Every day | ORAL | Status: DC

## 2014-12-25 NOTE — Telephone Encounter (Signed)
Please refill. Thanks!

## 2014-12-25 NOTE — Telephone Encounter (Signed)
Left message informing pt that his refills were sent to his pharmacy.

## 2014-12-25 NOTE — Telephone Encounter (Signed)
Patient has come in today for medication refill and asked if he can recieve a refill on his lisinopril; patient would [facility] [address];

## 2014-12-26 LAB — HM DIABETES EYE EXAM

## 2014-12-27 ENCOUNTER — Encounter: Payer: Self-pay | Admitting: Internal Medicine

## 2014-12-27 ENCOUNTER — Ambulatory Visit: Payer: Medicare Other | Attending: Internal Medicine | Admitting: Internal Medicine

## 2014-12-27 VITALS — BP 155/86 | HR 66 | Temp 98.2°F | Resp 16 | Ht 70.0 in | Wt 205.0 lb

## 2014-12-27 DIAGNOSIS — E119 Type 2 diabetes mellitus without complications: Secondary | ICD-10-CM | POA: Insufficient documentation

## 2014-12-27 DIAGNOSIS — Z794 Long term (current) use of insulin: Secondary | ICD-10-CM | POA: Diagnosis not present

## 2014-12-27 DIAGNOSIS — I10 Essential (primary) hypertension: Secondary | ICD-10-CM | POA: Diagnosis not present

## 2014-12-27 DIAGNOSIS — E1142 Type 2 diabetes mellitus with diabetic polyneuropathy: Secondary | ICD-10-CM | POA: Diagnosis not present

## 2014-12-27 DIAGNOSIS — E785 Hyperlipidemia, unspecified: Secondary | ICD-10-CM

## 2014-12-27 DIAGNOSIS — E1165 Type 2 diabetes mellitus with hyperglycemia: Secondary | ICD-10-CM

## 2014-12-27 LAB — GLUCOSE, POCT (MANUAL RESULT ENTRY)
POC Glucose: 330 mg/dl — AB (ref 70–99)
POC Glucose: 311 mg/dl — AB (ref 70–99)

## 2014-12-27 LAB — POCT GLYCOSYLATED HEMOGLOBIN (HGB A1C): HEMOGLOBIN A1C: 9.3

## 2014-12-27 MED ORDER — ATORVASTATIN CALCIUM 40 MG PO TABS: ORAL_TABLET | ORAL | Status: DC

## 2014-12-27 MED ORDER — GLIPIZIDE ER 5 MG PO TB24: 5.0000 mg | ORAL_TABLET | Freq: Every day | ORAL | Status: DC

## 2014-12-27 MED ORDER — INSULIN GLARGINE 100 UNIT/ML SOLOSTAR PEN: 28.0000 [IU] | PEN_INJECTOR | Freq: Every day | SUBCUTANEOUS | Status: DC

## 2014-12-27 MED ORDER — INSULIN ASPART 100 UNIT/ML ~~LOC~~ SOLN
10.0000 [IU] | Freq: Once | SUBCUTANEOUS | Status: AC
  Administered 2014-12-27: 10 [IU] via SUBCUTANEOUS

## 2014-12-27 MED ORDER — LISINOPRIL 10 MG PO TABS: 10.0000 mg | ORAL_TABLET | Freq: Every day | ORAL | Status: DC

## 2014-12-27 MED ORDER — GLUCOSE BLOOD VI STRP: ORAL_STRIP | Status: DC

## 2014-12-27 NOTE — Progress Notes (Signed)
Patient ID: Derek Lowery, male   DOB: 03/17/1956, 59 y.o.   MRN: [id number] 1. HTN: Medication: Lisinopril 10 mg, been out of medication for 2 weeks.  Home BP monitoring: Does not check at home  Positive ROS: edema Negative [id number]:[id number], chest pain, palpitations   2. DM2:  Medication: Lantus 25 QHS and glipizide XL 5 mg QD Home CBG monitoring: fasting 175-300, only checks once per day  Hypoglycemic event: none since switched to Lantus  Positive ROS: neuropathy on gabapentin, blurred vision (May 4th, 2016 eye exam) Negative ROS: polyuria/dipsia  3. HLD: Medication: atorvastatin Tolerance: well Positive ROS none Negative VOH:[id number], claudication   Social History reviewed: Smoker never Exercise not currently   Physical Exam  Cardiovascular: Normal rate, regular rhythm and normal heart sounds.   Pulmonary/Chest: Effort normal and breath sounds normal.  Feet:  Right Foot:  Skin Integrity: Negative for skin breakdown.  Left Foot:  Skin Integrity: Negative for skin breakdown.  Neurological: He is alert.    Ori was seen today for follow-up.  Diagnoses and all orders for this visit:  Poorly controlled type 2 diabetes mellitus Orders: -     Glucose (CBG) -     HgB A1c -     insulin aspart (novoLOG) injection 10 Units; Inject 0.1 mLs (10 Units total) into the skin once. -     glucose blood (TRUETEST TEST) test strip; Use as instructed -     Begin glipiZIDE (GLIPIZIDE XL) 5 MG 24 hr tablet; Take 1 tablet (5 mg total) by mouth daily with breakfast. -    Refill Insulin Glargine (LANTUS) 100 UNIT/ML Solostar Pen; Inject 28 Units into the skin daily at 10 pm. Increased dose for better control -     Basic Metabolic Panel -     Glucose (CBG) Added Glipizide to regimen. I have explained the importance of checking sugars more often to prevent hypoglycemic events   Diabetic polyneuropathy associated with type 2 diabetes mellitus Continue Gabapentin QHS for pain   Essential  hypertension, benign Orders: -     Refill lisinopril (PRINIVIL,ZESTRIL) 10 MG tablet; Take 1 tablet (10 mg total) by mouth daily. for blood pressure I have advised patient to call for refills 1 week prior to running out. I will have patient to begin back on medication and have him to come back for a BP check. Previous BP's have been WNL.  HLD (hyperlipidemia) Orders: -     atorvastatin (LIPITOR) 40 MG tablet; TAKE 1 TABLET BY MOUTH EVERY DAY FOR CHOLESTEROL Education provided on proper lifestyle changes in order to lower cholesterol. Patient advised to maintain healthy weight and to keep total fat intake at 25-35% of total calories and carbohydrates 50-60% of total daily calories. Explained how high cholesterol places patient at risk for heart disease. Patient placed on appropriate medication and repeat labs in 6 months   Return in about 2 weeks (around 01/10/2015) for with Jenny Reichmann and (RN--BS log review/BP check) and 3 mo PCP  .  Chari Manning, NP 01/12/2015 11:12 PM

## 2014-12-27 NOTE — Patient Instructions (Addendum)
[facility]---states that you owe a prior bill before they can make a appointment address: [address], [place], [place] [address] [phone]  Please increase Lantus to 28 units nightly. Come back in 3 weeks with the RN to check log book

## 2014-12-27 NOTE — Progress Notes (Signed)
Pt is here today following up on his HTN, hyperlipidemia and his diabetes.

## 2014-12-28 LAB — BASIC METABOLIC PANEL
BUN: 15 mg/dL (ref 6–23)
CHLORIDE: 103 meq/L (ref 96–112)
CO2: 26 meq/L (ref 19–32)
Calcium: 9.2 mg/dL (ref 8.4–10.5)
Creat: 0.71 mg/dL (ref 0.50–1.35)
GLUCOSE: 343 mg/dL — AB (ref 70–99)
Potassium: 4.9 mEq/L (ref 3.5–5.3)
Sodium: 137 mEq/L (ref 135–145)

## 2015-01-10 ENCOUNTER — Other Ambulatory Visit: Payer: Self-pay | Admitting: Internal Medicine

## 2015-01-12 DIAGNOSIS — E1142 Type 2 diabetes mellitus with diabetic polyneuropathy: Secondary | ICD-10-CM | POA: Insufficient documentation

## 2015-01-17 ENCOUNTER — Telehealth: Payer: Self-pay | Admitting: *Deleted

## 2015-01-17 NOTE — Telephone Encounter (Signed)
Relayed results to patient.

## 2015-01-17 NOTE — Telephone Encounter (Signed)
-----   Message from Lance Bosch, NP sent at 01/15/2015 12:37 PM EDT ----- Labs within normal limits

## 2015-01-25 ENCOUNTER — Telehealth: Payer: Self-pay | Admitting: Internal Medicine

## 2015-01-25 NOTE — Telephone Encounter (Signed)
Patient called to request a med refill for glipiZIDE (GLIPIZIDE XL) 5 MG 24 hr tablet, [facility] [address] Please f/u

## 2015-03-01 ENCOUNTER — Ambulatory Visit: Payer: Medicare Other | Attending: Internal Medicine | Admitting: *Deleted

## 2015-03-01 VITALS — BP 112/71 | HR 67 | Temp 97.7°F | Resp 20 | Ht 70.0 in | Wt 207.6 lb

## 2015-03-01 DIAGNOSIS — I10 Essential (primary) hypertension: Secondary | ICD-10-CM | POA: Diagnosis not present

## 2015-03-01 DIAGNOSIS — E119 Type 2 diabetes mellitus without complications: Secondary | ICD-10-CM | POA: Insufficient documentation

## 2015-03-01 DIAGNOSIS — Z794 Long term (current) use of insulin: Secondary | ICD-10-CM | POA: Insufficient documentation

## 2015-03-01 DIAGNOSIS — E1165 Type 2 diabetes mellitus with hyperglycemia: Secondary | ICD-10-CM | POA: Diagnosis not present

## 2015-03-01 LAB — GLUCOSE, POCT (MANUAL RESULT ENTRY)
POC Glucose: 267 mg/dl — AB (ref 70–99)
POC Glucose: 254 mg/dl — AB (ref 70–99)

## 2015-03-01 MED ORDER — GLUCOSE BLOOD VI STRP: ORAL_STRIP | Status: DC

## 2015-03-01 MED ORDER — GLIPIZIDE ER 5 MG PO TB24: 5.0000 mg | ORAL_TABLET | Freq: Every day | ORAL | Status: DC

## 2015-03-01 MED ORDER — INSULIN ASPART 100 UNIT/ML ~~LOC~~ SOLN
10.0000 [IU] | Freq: Once | SUBCUTANEOUS | Status: AC
  Administered 2015-03-01: 10 [IU] via SUBCUTANEOUS

## 2015-03-01 NOTE — Progress Notes (Signed)
Patient presents for BP check, CBG and record review for T2DM, however, patient did not bring log or meter. States it's been 2 months since he has checked BS due to running out of testing strips. [facility] [address] [facility], however, patient states he was told none were available Med list reviewed; patient reports taking all meds as directed except ran out of glipizide 2 months ago Denies increased thirst and urination Positive for blurry vision; patient thinks it may be due to eye drops he uses  CBG 267 6 hours after 2 cups coffee with artificial sweetener 10 units novolog insulin given SQ per protocol  Lab Results  Component Value Date   HGBA1C 9.30 12/27/2014   Filed Vitals:   03/01/15 1037  BP: 112/71  Pulse: 67  Temp: 97.7 F (36.5 C)  Resp: 20     Refills for glipizide and test strips e-scribed to [facility]  Patient will restart glipizide XL 5 mg daily with breakfast Patient will check BS bid AC and keep log Patient will f/u with PCP in 4 weeks with BS log  PCP made aware  Patient advised to call for med refills at least 7 days before running out so as not to go without.

## 2015-03-09 ENCOUNTER — Other Ambulatory Visit: Payer: Self-pay | Admitting: Internal Medicine

## 2015-03-09 DIAGNOSIS — E1165 Type 2 diabetes mellitus with hyperglycemia: Secondary | ICD-10-CM

## 2015-03-09 MED ORDER — GLUCOSE BLOOD VI STRP: ORAL_STRIP | Status: DC

## 2015-03-26 ENCOUNTER — Ambulatory Visit: Payer: Medicare Other | Attending: Internal Medicine | Admitting: Internal Medicine

## 2015-03-26 ENCOUNTER — Encounter: Payer: Self-pay | Admitting: Internal Medicine

## 2015-03-26 VITALS — BP 137/81 | HR 64 | Temp 98.0°F | Resp 16 | Wt 211.8 lb

## 2015-03-26 DIAGNOSIS — E1165 Type 2 diabetes mellitus with hyperglycemia: Secondary | ICD-10-CM

## 2015-03-26 DIAGNOSIS — E785 Hyperlipidemia, unspecified: Secondary | ICD-10-CM | POA: Diagnosis not present

## 2015-03-26 DIAGNOSIS — Z23 Encounter for immunization: Secondary | ICD-10-CM

## 2015-03-26 DIAGNOSIS — I10 Essential (primary) hypertension: Secondary | ICD-10-CM | POA: Diagnosis not present

## 2015-03-26 DIAGNOSIS — E119 Type 2 diabetes mellitus without complications: Secondary | ICD-10-CM

## 2015-03-26 LAB — POCT GLYCOSYLATED HEMOGLOBIN (HGB A1C): Hemoglobin A1C: 10.5

## 2015-03-26 LAB — GLUCOSE, POCT (MANUAL RESULT ENTRY): POC GLUCOSE: 198 mg/dL — AB (ref 70–99)

## 2015-03-26 MED ORDER — GLUCOSE BLOOD VI STRP: ORAL_STRIP | Status: DC

## 2015-03-26 MED ORDER — INSULIN GLARGINE 100 UNIT/ML SOLOSTAR PEN: 28.0000 [IU] | PEN_INJECTOR | Freq: Every day | SUBCUTANEOUS | Status: DC

## 2015-03-26 MED ORDER — LISINOPRIL 10 MG PO TABS: 10.0000 mg | ORAL_TABLET | Freq: Every day | ORAL | Status: DC

## 2015-03-26 MED ORDER — ATORVASTATIN CALCIUM 40 MG PO TABS: ORAL_TABLET | ORAL | Status: DC

## 2015-03-26 NOTE — Progress Notes (Signed)
Patient here for follow up on his diabetes And a routine check up

## 2015-03-26 NOTE — Patient Instructions (Signed)
You will receive a call from podiatry, GI for colonoscopy, and Endocrinology for diabetes management.

## 2015-03-26 NOTE — Progress Notes (Signed)
Patient ID: Derek Lowery, male   DOB: 01/27/1956, 59 y.o.   MRN: [id number] 1. HTN: Medication: lisinopril Home BP monitoring: does not check Positive ROS none Negative BZJ:IRCVELFY, chest pain, palpitations, SOB, edema, blurred vision  2. DM2:  Medication: glipizide XL 5 mg, Lantus 28 units QHS Home CBG monitoring: patient reports that he got the last strips he received the wrong ones so he has not check sugars in one month. He has continued to use glipizide and Lantus.  Hypoglycemic event: none Positive ROS neuropathy Negative BOF:BPZWCHEN/IDPOEU Patient's A1C has increased from 9.3 to 10.5% since May. He reports that he has trying to follow ADA diet. He does eat pasta often and tries to monitor potato intake. Drinks 3-4 glasses of tea per day usually.   3. HLD: Medication: lipitor 40 mg Tolerance: well Positive ROS none Negative MPN:TIRWERXV, claudications  Social History reviewed: Smoker never Exercise not currently   Filed Vitals:   03/26/15 1132  BP: 137/81  Pulse: 64  Temp: 98 F (36.7 C)  Resp: 16     Physical Exam  Constitutional: He is oriented to person, place, and time.  Cardiovascular: Normal rate, regular rhythm and normal heart sounds.   Pulses:      Dorsalis pedis pulses are 2+ on the right side, and 2+ on the left side.       Posterior tibial pulses are 2+ on the right side, and 2+ on the left side.  Pulmonary/Chest: Effort normal and breath sounds normal.  Musculoskeletal: He exhibits edema (BLE).  Feet:  Right Foot:  Protective Sensation: 10 sites tested.6 sites sensed. Skin Integrity: Positive for dry skin. Negative for skin breakdown.  Left Foot:  Protective Sensation: 10 sites tested. 6 sites sensed. Skin Integrity: Positive for dry skin. Negative for skin breakdown.  Neurological: He is alert and oriented to person, place, and time.    Derek Lowery was seen today for follow-up.  Diagnoses and all orders for this visit:  Poorly controlled  type 2 diabetes mellitus Orders: -     Glucose (CBG) -     HgB A1c -     glucose blood (TRUETEST TEST) test strip; Check sugar 3 times per day for E11.65 -     Ambulatory referral to Gastroenterology---colonoscopy -     Ambulatory referral to Podiatry -     Ambulatory referral to Endocrinology -     Insulin Glargine (LANTUS) 100 UNIT/ML Solostar Pen; Inject 28 Units into the skin daily at 10 pm. Patients diabetes remains uncontrolled as evidence by hemoglobin a1c >8.  Patient has been non-compliant with recommended diet. Stressed the multiple complications associated with uncontrolled diabetes.  Patient did not bring sugar log today so unable to make changes. He will stay on current medication dose and will be referred to Endocrinology for further management.   Essential hypertension, benign Orders: -     lisinopril (PRINIVIL,ZESTRIL) 10 MG tablet; Take 1 tablet (10 mg total) by mouth daily. for blood pressure Stable, refill medications. Dash diet  HLD (hyperlipidemia) Orders: -     atorvastatin (LIPITOR) 40 MG tablet; TAKE 1 TABLET BY MOUTH EVERY DAY FOR CHOLESTEROL LDL is currently 56 done 06/01/15. He is stable, refilled medications. Will check lipid panel on next exam  Need for Tdap vaccination Orders: -     Tdap vaccine greater than or equal to 7yo IM   Return in about 3 months (around 06/26/2015) for DM/HTN.   Derek Bosch, NP 03/26/2015 6:17 PM

## 2015-04-10 ENCOUNTER — Encounter: Payer: Self-pay | Admitting: Endocrinology

## 2015-04-18 ENCOUNTER — Encounter: Payer: Self-pay | Admitting: Endocrinology

## 2015-04-18 ENCOUNTER — Ambulatory Visit (INDEPENDENT_AMBULATORY_CARE_PROVIDER_SITE_OTHER): Payer: Medicare Other | Admitting: Endocrinology

## 2015-04-18 VITALS — BP 126/83 | HR 68 | Temp 97.3°F | Ht 70.0 in | Wt 209.0 lb

## 2015-04-18 DIAGNOSIS — E119 Type 2 diabetes mellitus without complications: Secondary | ICD-10-CM | POA: Diagnosis not present

## 2015-04-18 DIAGNOSIS — E1165 Type 2 diabetes mellitus with hyperglycemia: Secondary | ICD-10-CM

## 2015-04-18 MED ORDER — INSULIN LISPRO 100 UNIT/ML (KWIKPEN): 2.0000 [IU] | PEN_INJECTOR | Freq: Two times a day (BID) | SUBCUTANEOUS | Status: DC

## 2015-04-18 MED ORDER — INSULIN GLARGINE 100 UNIT/ML SOLOSTAR PEN: 25.0000 [IU] | PEN_INJECTOR | SUBCUTANEOUS | Status: DC

## 2015-04-18 NOTE — Progress Notes (Signed)
Subjective:    Patient ID: Derek Lowery, male    DOB: [birth year], 59 y.o.   MRN: [id number]  HPI pt states DM was dx'ed in 1990; he has severe neuropathy of the lower extremities; he has associated retinopathy and CAD; he has been on insulin since 1999; pt says his diet and exercise are; he has never had pancreatitis, severe hypoglycemia.  He had DKA in 2015, when he missed his insulin.  he says he could not afford insulin, but now he can.  He takes qd lantus and glipizide.  he brings a record of his cbg's which i have reviewed today.  It varies from 109-220.  There is no trend throughout the day.  Pt says he never misses the insulin. He says lunch is his smallest meal.   Past Medical History  Diagnosis Date  . Hypercholesteremia   . Type 2 diabetes mellitus 1990  . Internal hemorrhoids   . Essential hypertension, benign   . Noncompliance   . CAD (coronary artery disease)     NSTEMI 06/2011:  LHC 07/18/11: Proximal diagonal 60%, distal LAD with a diabetic appearance and 60% stenosis, OM2 with an occluded superior branch and an inferior branch with 90%, EF 55% with inferior hypokinesis.  PCI: Promus DES to the OM2 inferior branch.  This vessel provides collaterals to the superior branch which remained occluded.  Echocardiogram 07/18/11: EF 60%, normal wall motion.  Marland Kitchen Glaucoma     Past Surgical History  Procedure Laterality Date  . Colonoscopy  2000    Dr. Collene Mares  . Irrigation and debridement abscess Left 05/05/2014    Procedure: IRRIGATION AND DEBRIDEMENT ABSCESS left buttock;  Surgeon: Michael Boston, MD;  Location: [facility];  Service: General;  Laterality: Left;  . Left heart catheterization with coronary angiogram N/A 07/18/2011    Procedure: LEFT HEART CATHETERIZATION WITH CORONARY ANGIOGRAM;  Surgeon: Hillary Bow, MD;  Location: [facility] CATH LAB;  Service: Cardiovascular;  Laterality: N/A;  . Percutaneous coronary stent intervention (pci-s)  07/18/2011    Procedure: PERCUTANEOUS  CORONARY STENT INTERVENTION (PCI-S);  Surgeon: Hillary Bow, MD;  Location: [facility] CATH LAB;  Service: Cardiovascular;;    Social History   Social History  . Marital Status: Single    Spouse Name: N/A  . Number of Children: N/A  . Years of Education: N/A   Occupational History  . Janitor    Social History Main Topics  . Smoking status: Never Smoker   . Smokeless tobacco: Never Used  . Alcohol Use: No  . Drug Use: No  . Sexual Activity: No   Other Topics Concern  . Not on file   Social History Narrative   Works at [employer]    Current Outpatient Prescriptions on File Prior to Visit  Medication Sig Dispense Refill  . aspirin EC 81 MG tablet Take 1 tablet (81 mg total) by mouth daily. 30 tablet 4  . atorvastatin (LIPITOR) 40 MG tablet TAKE 1 TABLET BY MOUTH EVERY DAY FOR CHOLESTEROL 30 tablet 5  . beta carotene w/minerals (OCUVITE) tablet Take 1 tablet by mouth daily.    . Cinnamon 500 MG capsule Take 1,000 mg by mouth daily.    . dorzolamide-timolol (COSOPT) 22.3-6.8 MG/ML ophthalmic solution Place 1 drop into both eyes 2 (two) times daily.    Marland Kitchen gabapentin (NEURONTIN) 300 MG capsule Take 1 capsule (300 mg total) by mouth at bedtime. 90 capsule 3  . glucose blood (TRUETEST TEST) test  strip Check sugar 3 times per day for E11.65 100 each 12  . glucose monitoring kit (FREESTYLE) monitoring kit 1 each by Does not apply route as needed for other. 1 each 0  . Insulin Syringe-Needle U-100 (SAFETY-GLIDE 0.3CC SYR 29GX1/2) 29G X 1/2" 0.3 ML MISC Use as directed. 200 each 3  . lisinopril (PRINIVIL,ZESTRIL) 10 MG tablet Take 1 tablet (10 mg total) by mouth daily. for blood pressure 30 tablet 5  . Multiple Vitamins-Minerals (MULTIVITAMIN WITH MINERALS) tablet Take 1 tablet by mouth daily.    . Omega-3 Fatty Acids (FISH OIL) 1000 MG CAPS Take by mouth.    . prednisoLONE acetate (PRED FORTE) 1 % ophthalmic suspension Place 1 drop into the right eye 4 (four)  times daily.    . vitamin C (VITAMIN C) 500 MG tablet Take 1 tablet (500 mg total) by mouth 2 (two) times daily.     No current facility-administered medications on file prior to visit.    No Known Allergies  Family History  Problem Relation Age of Onset  . Coronary artery disease Father     Developed in his 53s  . Kidney disease Father   . Diabetes Father   . Melanoma Father   . Heart failure Mother   . Hypertension Mother   . Diabetes Sister   . Diabetes Brother     BP 126/83 mmHg  Pulse 68  Temp(Src) 97.3 F (36.3 C) (Oral)  Ht _0  (1.778 m)  Wt 209 lb (94.802 kg)  BMI 29.99 kg/m2  SpO2 97%  Review of Systems denies weight loss, chest pain, sob, n/v, muscle cramps, excessive diaphoresis, memory loss, and cold intolerance.  He has blurry vision, dry mouth, rhinorrhea, easy bruising, headache, urinary frequency, and foot pain.     Objective:   Physical Exam VS: see vs page GEN: no distress HEAD: head: no deformity eyes: no periorbital swelling, no proptosis external nose and ears are normal mouth: no lesion seen NECK: supple, thyroid is not enlarged CHEST WALL: no deformity LUNGS: clear to auscultation BREASTS:  No gynecomastia CV: reg rate and rhythm, no murmur ABD: abdomen is soft, nontender.  no hepatosplenomegaly.  not distended.  no hernia MUSCULOSKELETAL: muscle bulk and strength are grossly normal.  no obvious joint swelling.  gait is steady with a cane EXTEMITIES: no deformity.  no ulcer on the feet.  feet are of normal color and temp.  1+ bilat leg edema PULSES: dorsalis pedis intact bilat.  no carotid bruit NEURO:  cn 2-12 grossly intact.   readily moves all 4's.  sensation is intact to touch on the feet, but severely decreased from normal.  SKIN:  Normal texture and temperature.  No rash or suspicious lesion is visible.  Skin on the feet is dry. NODES:  None palpable at the neck PSYCH: alert, well-oriented.  Does not appear anxious nor  depressed.  Lab Results  Component Value Date   HGBA1C 10.50 03/26/2015   i personally reviewed electrocardiogram tracing (05/05/14): Indication: preop Impression: Sinus tachycardia.  Possible Lateral infarct , age undetermined  I have reviewed outside records: (03/26/15): pt said at that time that he was not checking cbg's.    Assessment & Plan:  DM: severe exacerbation.  We discussed multiple daily injections.  We agree to phase this in slowly.  Noncompliance with cbg recording, much better.  Patient is advised the following: Patient Instructions  good diet and exercise significantly improve the control of your diabetes.  please let me  know if you wish to be referred to a dietician.  high blood sugar is very risky  to your health.  you should see an eye doctor and dentist every year.  It is very important to get all recommended vaccinations.  controlling your blood pressure and cholesterol drastically reduces the damage diabetes does to your body.  Those who smoke should quit.  please discuss these  with your doctor.  check your blood sugar twice a day.  vary the time of day when you check, between before the 3 meals, and at bedtime.  also check if you have symptoms of your  blood sugar being too high or too low.  please keep a record of the readings and bring it to your next appointment here.  You can write it on any piece of paper.   please call us sooner if your blood sugar goes below 70, or if you have a lot of readings over 200. For now: Please stop taking the glipizide, and: Reduce the lantus to 25 units each morning, and: Add humalog 2 units with breakfast and with the evening meal. Please come back for a follow-up appointment in 1 month.

## 2015-04-18 NOTE — Patient Instructions (Addendum)
good diet and exercise significantly improve the control of your diabetes.  please let me know if you wish to be referred to a dietician.  high blood sugar is very risky  to your health.  you should see an eye doctor and dentist every year.  It is very important to get all recommended vaccinations.  controlling your blood pressure and cholesterol drastically reduces the damage diabetes does to your body.  Those who smoke should quit.  please discuss these  with your doctor.  check your blood sugar twice a day.  vary the time of day when you check, between before the 3 meals, and at bedtime.  also check if you have symptoms of your  blood sugar being too high or too low.  please keep a record of the readings and bring it to your next appointment here.  You can write it on any piece of paper.   please call us sooner if your blood sugar goes below 70, or if you have a lot of readings over 200. For now: Please stop taking the glipizide, and: Reduce the lantus to 25 units each morning, and: Add humalog 2 units with breakfast and with the evening meal. Please come back for a follow-up appointment in 1 month.

## 2015-04-25 DIAGNOSIS — E11359 Type 2 diabetes mellitus with proliferative diabetic retinopathy without macular edema: Secondary | ICD-10-CM | POA: Diagnosis not present

## 2015-04-25 DIAGNOSIS — H2511 Age-related nuclear cataract, right eye: Secondary | ICD-10-CM | POA: Diagnosis not present

## 2015-04-25 DIAGNOSIS — H4050X2 Glaucoma secondary to other eye disorders, unspecified eye, moderate stage: Secondary | ICD-10-CM | POA: Diagnosis not present

## 2015-05-07 DIAGNOSIS — E11359 Type 2 diabetes mellitus with proliferative diabetic retinopathy without macular edema: Secondary | ICD-10-CM | POA: Diagnosis not present

## 2015-05-07 DIAGNOSIS — E11351 Type 2 diabetes mellitus with proliferative diabetic retinopathy with macular edema: Secondary | ICD-10-CM | POA: Diagnosis not present

## 2015-05-07 DIAGNOSIS — H4051X4 Glaucoma secondary to other eye disorders, right eye, indeterminate stage: Secondary | ICD-10-CM | POA: Diagnosis not present

## 2015-05-07 DIAGNOSIS — H211X2 Other vascular disorders of iris and ciliary body, left eye: Secondary | ICD-10-CM | POA: Diagnosis not present

## 2015-05-18 ENCOUNTER — Telehealth: Payer: Self-pay | Admitting: Endocrinology

## 2015-05-18 ENCOUNTER — Ambulatory Visit: Payer: Medicare Other | Admitting: Endocrinology

## 2015-05-18 NOTE — Telephone Encounter (Signed)
No follow up necessary (was seen yesterday)

## 2015-05-18 NOTE — Telephone Encounter (Signed)
Patient no showed today's appt. Please advise on how to follow up. °A. No follow up necessary. °B. Follow up urgent. Contact patient immediately. °C. Follow up necessary. Contact patient and schedule visit in ___ days. °D. Follow up advised. Contact patient and schedule visit in ____weeks. ° °

## 2015-06-04 DIAGNOSIS — H35371 Puckering of macula, right eye: Secondary | ICD-10-CM | POA: Diagnosis not present

## 2015-06-04 DIAGNOSIS — H4051X4 Glaucoma secondary to other eye disorders, right eye, indeterminate stage: Secondary | ICD-10-CM | POA: Diagnosis not present

## 2015-06-04 DIAGNOSIS — H409 Unspecified glaucoma: Secondary | ICD-10-CM | POA: Diagnosis not present

## 2015-06-04 DIAGNOSIS — E113593 Type 2 diabetes mellitus with proliferative diabetic retinopathy without macular edema, bilateral: Secondary | ICD-10-CM | POA: Diagnosis not present

## 2015-06-25 DIAGNOSIS — H4050X3 Glaucoma secondary to other eye disorders, unspecified eye, severe stage: Secondary | ICD-10-CM | POA: Diagnosis not present

## 2015-06-25 DIAGNOSIS — E113593 Type 2 diabetes mellitus with proliferative diabetic retinopathy without macular edema, bilateral: Secondary | ICD-10-CM | POA: Diagnosis not present

## 2015-06-25 DIAGNOSIS — H211X1 Other vascular disorders of iris and ciliary body, right eye: Secondary | ICD-10-CM | POA: Diagnosis not present

## 2015-06-25 DIAGNOSIS — H35371 Puckering of macula, right eye: Secondary | ICD-10-CM | POA: Diagnosis not present

## 2015-06-27 ENCOUNTER — Other Ambulatory Visit: Payer: Self-pay

## 2015-06-27 DIAGNOSIS — I10 Essential (primary) hypertension: Secondary | ICD-10-CM

## 2015-06-27 DIAGNOSIS — E785 Hyperlipidemia, unspecified: Secondary | ICD-10-CM

## 2015-06-27 MED ORDER — ATORVASTATIN CALCIUM 40 MG PO TABS: ORAL_TABLET | ORAL | Status: DC

## 2015-06-27 MED ORDER — LISINOPRIL 10 MG PO TABS: 10.0000 mg | ORAL_TABLET | Freq: Every day | ORAL | Status: DC

## 2015-07-04 DIAGNOSIS — E113592 Type 2 diabetes mellitus with proliferative diabetic retinopathy without macular edema, left eye: Secondary | ICD-10-CM | POA: Diagnosis not present

## 2015-07-25 DIAGNOSIS — E113591 Type 2 diabetes mellitus with proliferative diabetic retinopathy without macular edema, right eye: Secondary | ICD-10-CM | POA: Diagnosis not present

## 2015-09-08 ENCOUNTER — Other Ambulatory Visit: Payer: Self-pay | Admitting: Internal Medicine

## 2015-09-12 DIAGNOSIS — H35371 Puckering of macula, right eye: Secondary | ICD-10-CM | POA: Diagnosis not present

## 2015-09-12 DIAGNOSIS — E113591 Type 2 diabetes mellitus with proliferative diabetic retinopathy without macular edema, right eye: Secondary | ICD-10-CM | POA: Diagnosis not present

## 2015-09-12 DIAGNOSIS — E113592 Type 2 diabetes mellitus with proliferative diabetic retinopathy without macular edema, left eye: Secondary | ICD-10-CM | POA: Diagnosis not present

## 2015-09-12 DIAGNOSIS — H4720 Unspecified optic atrophy: Secondary | ICD-10-CM | POA: Diagnosis not present

## 2015-09-12 DIAGNOSIS — H4050X3 Glaucoma secondary to other eye disorders, unspecified eye, severe stage: Secondary | ICD-10-CM | POA: Diagnosis not present

## 2015-09-24 DIAGNOSIS — E113593 Type 2 diabetes mellitus with proliferative diabetic retinopathy without macular edema, bilateral: Secondary | ICD-10-CM | POA: Diagnosis not present

## 2015-09-24 DIAGNOSIS — H2511 Age-related nuclear cataract, right eye: Secondary | ICD-10-CM | POA: Diagnosis not present

## 2015-09-24 DIAGNOSIS — H4050X2 Glaucoma secondary to other eye disorders, unspecified eye, moderate stage: Secondary | ICD-10-CM | POA: Diagnosis not present

## 2015-10-03 LAB — HM DIABETES EYE EXAM

## 2015-10-05 ENCOUNTER — Emergency Department (HOSPITAL_COMMUNITY)
Admission: EM | Admit: 2015-10-05 | Discharge: 2015-10-05 | Disposition: A | Discharge status: Home / Self Care | Payer: Medicare Other | Attending: Emergency Medicine | Admitting: Emergency Medicine

## 2015-10-05 ENCOUNTER — Encounter (HOSPITAL_COMMUNITY): Payer: Self-pay | Admitting: *Deleted

## 2015-10-05 DIAGNOSIS — Z7982 Long term (current) use of aspirin: Secondary | ICD-10-CM | POA: Diagnosis not present

## 2015-10-05 DIAGNOSIS — R112 Nausea with vomiting, unspecified: Secondary | ICD-10-CM

## 2015-10-05 DIAGNOSIS — Z794 Long term (current) use of insulin: Secondary | ICD-10-CM | POA: Diagnosis not present

## 2015-10-05 DIAGNOSIS — Z8719 Personal history of other diseases of the digestive system: Secondary | ICD-10-CM | POA: Diagnosis not present

## 2015-10-05 DIAGNOSIS — E7800 Pure hypercholesterolemia, unspecified: Secondary | ICD-10-CM | POA: Diagnosis not present

## 2015-10-05 DIAGNOSIS — H409 Unspecified glaucoma: Secondary | ICD-10-CM | POA: Insufficient documentation

## 2015-10-05 DIAGNOSIS — I2510 Atherosclerotic heart disease of native coronary artery without angina pectoris: Secondary | ICD-10-CM | POA: Diagnosis not present

## 2015-10-05 DIAGNOSIS — I10 Essential (primary) hypertension: Secondary | ICD-10-CM | POA: Diagnosis not present

## 2015-10-05 DIAGNOSIS — Z79899 Other long term (current) drug therapy: Secondary | ICD-10-CM | POA: Insufficient documentation

## 2015-10-05 DIAGNOSIS — Z9119 Patient's noncompliance with other medical treatment and regimen: Secondary | ICD-10-CM | POA: Diagnosis not present

## 2015-10-05 DIAGNOSIS — K2970 Gastritis, unspecified, without bleeding: Secondary | ICD-10-CM | POA: Diagnosis not present

## 2015-10-05 DIAGNOSIS — E1165 Type 2 diabetes mellitus with hyperglycemia: Secondary | ICD-10-CM | POA: Diagnosis not present

## 2015-10-05 DIAGNOSIS — R739 Hyperglycemia, unspecified: Secondary | ICD-10-CM

## 2015-10-05 LAB — CBC WITH DIFFERENTIAL/PLATELET
BASOS ABS: 0.1 10*3/uL (ref 0.0–0.1)
BASOS PCT: 1 %
EOS ABS: 0.3 10*3/uL (ref 0.0–0.7)
EOS PCT: 3 %
HEMATOCRIT: 41.2 % (ref 39.0–52.0)
Hemoglobin: 13.6 g/dL (ref 13.0–17.0)
Lymphocytes Relative: 14 %
Lymphs Abs: 1.2 10*3/uL (ref 0.7–4.0)
MCH: 28.5 pg (ref 26.0–34.0)
MCHC: 33 g/dL (ref 30.0–36.0)
MCV: 86.2 fL (ref 78.0–100.0)
MONO ABS: 0.5 10*3/uL (ref 0.1–1.0)
MONOS PCT: 6 %
NEUTROS ABS: 6.6 10*3/uL (ref 1.7–7.7)
Neutrophils Relative %: 76 %
PLATELETS: 169 10*3/uL (ref 150–400)
RBC: 4.78 MIL/uL (ref 4.22–5.81)
RDW: 13.2 % (ref 11.5–15.5)
WBC: 8.7 10*3/uL (ref 4.0–10.5)

## 2015-10-05 LAB — COMPREHENSIVE METABOLIC PANEL
ALT: 15 U/L — AB (ref 17–63)
AST: 18 U/L (ref 15–41)
Albumin: 4.1 g/dL (ref 3.5–5.0)
Alkaline Phosphatase: 58 U/L (ref 38–126)
Anion gap: 9 (ref 5–15)
BILIRUBIN TOTAL: 0.9 mg/dL (ref 0.3–1.2)
BUN: 13 mg/dL (ref 6–20)
CALCIUM: 9 mg/dL (ref 8.9–10.3)
CHLORIDE: 105 mmol/L (ref 101–111)
CO2: 25 mmol/L (ref 22–32)
CREATININE: 0.78 mg/dL (ref 0.61–1.24)
Glucose, Bld: 258 mg/dL — ABNORMAL HIGH (ref 65–99)
Potassium: 4.1 mmol/L (ref 3.5–5.1)
Sodium: 139 mmol/L (ref 135–145)
TOTAL PROTEIN: 7.2 g/dL (ref 6.5–8.1)

## 2015-10-05 LAB — TROPONIN I

## 2015-10-05 LAB — BLOOD GAS, VENOUS
Acid-base deficit: 0.3 mmol/L (ref 0.0–2.0)
Bicarbonate: 25.1 mEq/L — ABNORMAL HIGH (ref 20.0–24.0)
O2 Saturation: 92 %
PATIENT TEMPERATURE: 98.6
PCO2 VEN: 46 mmHg (ref 45.0–50.0)
PH VEN: 7.356 — AB (ref 7.250–7.300)
TCO2: 22.2 mmol/L (ref 0–100)
pO2, Ven: 68.2 mmHg — ABNORMAL HIGH (ref 30.0–45.0)

## 2015-10-05 LAB — LIPASE, BLOOD: LIPASE: 27 U/L (ref 11–51)

## 2015-10-05 LAB — I-STAT CG4 LACTIC ACID, ED: LACTIC ACID, VENOUS: 1.62 mmol/L (ref 0.5–2.0)

## 2015-10-05 MED ORDER — ONDANSETRON HCL 4 MG/2ML IJ SOLN
4.0000 mg | Freq: Once | INTRAMUSCULAR | Status: AC
  Administered 2015-10-05: 4 mg via INTRAVENOUS
  Filled 2015-10-05: qty 2

## 2015-10-05 MED ORDER — ONDANSETRON 4 MG PO TBDP: 4.0000 mg | ORAL_TABLET | Freq: Three times a day (TID) | ORAL | Status: DC | PRN

## 2015-10-05 MED ORDER — PROMETHAZINE HCL 25 MG/ML IJ SOLN
25.0000 mg | Freq: Once | INTRAMUSCULAR | Status: AC
  Administered 2015-10-05: 25 mg via INTRAVENOUS
  Filled 2015-10-05: qty 1

## 2015-10-05 MED ORDER — PROMETHAZINE HCL 25 MG/ML IJ SOLN
25.0000 mg | Freq: Once | INTRAMUSCULAR | Status: DC
  Filled 2015-10-05: qty 1

## 2015-10-05 MED ORDER — SODIUM CHLORIDE 0.9 % IV BOLUS (SEPSIS)
1000.0000 mL | Freq: Once | INTRAVENOUS | Status: AC
  Administered 2015-10-05: 1000 mL via INTRAVENOUS

## 2015-10-05 NOTE — ED Notes (Signed)
Bed: TA56 Expected date:  Expected time:  Means of arrival:  Comments: EMS 60yo [place]

## 2015-10-05 NOTE — Discharge Instructions (Signed)
As discussed, your evaluation today has been largely reassuring.  But, it is important that you monitor your condition carefully, and do not hesitate to return to the ED if you develop new, or concerning changes in your condition. ? ?Otherwise, please follow-up with your physician for appropriate ongoing care. ? ?

## 2015-10-05 NOTE — ED Notes (Signed)
Per EMS report: pt coming from home.  Pt reports abd pain that began around 10am today.  Pt reports dizziness and began vomiting around noon.  Pt hx of DM and had an at home CBG reading 259 and an EMS reading of 229.  Pt reports pt usually runs around 250s in his CBG.   EMS gave pt 21m zofran with relief.

## 2015-10-05 NOTE — ED Provider Notes (Signed)
CSN: [id number]     Arrival date & time 10/05/15  1256 History   First MD Initiated Contact with Patient 10/05/15 1312     Chief Complaint  Patient presents with  . Emesis    HPI  Patient presents with concern of nausea, vomiting. Patient specifically denies any abdominal pain currently, states that he has had nausea, vomiting for approximately 2 hours. He states that he has been taking all medication as directed, including insulin recently. Patient states that his blood glucose at home was approximately 260, EMS reports value of 229. Patient had substantial improvement with Zofran. He denies fever, chills, chest pain, syncope. He states that since discharge from our affiliated facility has been doing generally well.    Past Medical History  Diagnosis Date  . Hypercholesteremia   . Type 2 diabetes mellitus ([place]) 1990  . Internal hemorrhoids   . Essential hypertension, benign   . Noncompliance   . CAD (coronary artery disease)     NSTEMI 06/2011:  LHC 07/18/11: Proximal diagonal 60%, distal LAD with a diabetic appearance and 60% stenosis, OM2 with an occluded superior branch and an inferior branch with 90%, EF 55% with inferior hypokinesis.  PCI: Promus DES to the OM2 inferior branch.  This vessel provides collaterals to the superior branch which remained occluded.  Echocardiogram 07/18/11: EF 60%, normal wall motion.  Marland Kitchen Glaucoma    Past Surgical History  Procedure Laterality Date  . Colonoscopy  2000    Dr. Collene Mares  . Irrigation and debridement abscess Left 05/05/2014    Procedure: IRRIGATION AND DEBRIDEMENT ABSCESS left buttock;  Surgeon: Michael Boston, MD;  Location: [facility];  Service: General;  Laterality: Left;  . Left heart catheterization with coronary angiogram N/A 07/18/2011    Procedure: LEFT HEART CATHETERIZATION WITH CORONARY ANGIOGRAM;  Surgeon: Hillary Bow, MD;  Location: [facility] CATH LAB;  Service: Cardiovascular;  Laterality: N/A;  . Percutaneous coronary stent  intervention (pci-s)  07/18/2011    Procedure: PERCUTANEOUS CORONARY STENT INTERVENTION (PCI-S);  Surgeon: Hillary Bow, MD;  Location: [facility] CATH LAB;  Service: Cardiovascular;;  . Eye surgery     Family History  Problem Relation Age of Onset  . Coronary artery disease Father     Developed in his 61s  . Kidney disease Father   . Diabetes Father   . Melanoma Father   . Heart failure Mother   . Hypertension Mother   . Diabetes Sister   . Diabetes Brother    Social History  Substance Use Topics  . Smoking status: Never Smoker   . Smokeless tobacco: Never Used  . Alcohol Use: No    Review of Systems  Constitutional:       Per HPI, otherwise negative  HENT:       Per HPI, otherwise negative  Respiratory:       Per HPI, otherwise negative  Cardiovascular:       Per HPI, otherwise negative  Gastrointestinal: Positive for nausea and vomiting.  Endocrine:       Negative aside from HPI  Genitourinary:       Neg aside from HPI   Musculoskeletal:       Per HPI, otherwise negative  Skin: Negative.   Neurological: Positive for weakness. Negative for syncope.      Allergies  Review of patient's allergies indicates no known allergies.  Home Medications   Prior to Admission medications   Medication Sig Start Date End Date Taking? Authorizing Provider  aspirin EC  81 MG tablet Take 1 tablet (81 mg total) by mouth daily. 05/19/14  Yes Lance Bosch, NP  atorvastatin (LIPITOR) 40 MG tablet TAKE 1 TABLET BY MOUTH EVERY DAY FOR CHOLESTEROL 06/27/15  Yes Lance Bosch, NP  Cinnamon 500 MG capsule Take 500 mg by mouth daily.    Yes Historical Provider, MD  dorzolamide-timolol (COSOPT) 22.3-6.8 MG/ML ophthalmic solution Place 1 drop into both eyes 2 (two) times daily.   Yes Historical Provider, MD  gabapentin (NEURONTIN) 300 MG capsule TAKE ONE CAPSULE BY MOUTH EVERY DAY AT BEDTIME 09/10/15  Yes Lance Bosch, NP  glucose blood (TRUETEST TEST) test strip Check sugar 3 times per day  for E11.65 03/26/15  Yes Lance Bosch, NP  glucose monitoring kit (FREESTYLE) monitoring kit 1 each by Does not apply route as needed for other. 05/10/14  Yes Modena Jansky, MD  Insulin Glargine (LANTUS) 100 UNIT/ML Solostar Pen Inject 25 Units into the skin every morning. 04/18/15  Yes Renato Shin, MD  insulin lispro (HUMALOG KWIKPEN) 100 UNIT/ML KiwkPen Inject 0.02 mLs (2 Units total) into the skin 2 (two) times daily with a meal. Breakfast and supper, and pen needles 3/day 04/18/15  Yes Renato Shin, MD  Insulin Syringe-Needle U-100 (SAFETY-GLIDE 0.3CC SYR 29GX1/2) 29G X 1/2" 0.3 ML MISC Use as directed. 11/09/14  Yes Lance Bosch, NP  lisinopril (PRINIVIL,ZESTRIL) 10 MG tablet Take 1 tablet (10 mg total) by mouth daily. for blood pressure 06/27/15  Yes Lance Bosch, NP  Multiple Vitamins-Minerals (MULTIVITAMIN WITH MINERALS) tablet Take 1 tablet by mouth daily.   Yes Historical Provider, MD  vitamin C (VITAMIN C) 500 MG tablet Take 1 tablet (500 mg total) by mouth 2 (two) times daily. Patient not taking: Reported on 10/05/2015 05/10/14   Modena Jansky, MD   BP 157/81 mmHg  Pulse 70  Temp(Src) 97.7 F (36.5 C) (Oral)  Resp 18  SpO2 100% Physical Exam  Constitutional: He is oriented to person, place, and time. He appears well-developed. No distress.  HENT:  Head: Normocephalic and atraumatic.  Eyes: Conjunctivae and EOM are normal.  Cardiovascular: Normal rate and regular rhythm.   Pulmonary/Chest: Effort normal. No stridor. No respiratory distress.  Abdominal: He exhibits no distension. There is no tenderness. There is no rebound.  Musculoskeletal: He exhibits no edema.  Neurological: He is alert and oriented to person, place, and time.  Skin: Skin is warm and dry. There is pallor.  Psychiatric: He has a normal mood and affect.  Nursing note and vitals reviewed.   ED Course  Procedures (including critical care time) Labs Review Labs Reviewed  COMPREHENSIVE METABOLIC PANEL -  Abnormal; Notable for the following:    Glucose, Bld 258 (*)    ALT 15 (*)    All other components within normal limits  BLOOD GAS, VENOUS - Abnormal; Notable for the following:    pH, Ven 7.356 (*)    pO2, Ven 68.2 (*)    Bicarbonate 25.1 (*)    All other components within normal limits  LIPASE, BLOOD  TROPONIN I  CBC WITH DIFFERENTIAL/PLATELET  URINALYSIS, ROUTINE W REFLEX MICROSCOPIC (NOT AT [facility])  I-STAT CG4 LACTIC ACID, ED   I have personally reviewed and evaluated these lab results as part of my medical decision-making.  3:50 PM Patient asleep.  4:15 PM Patient sitting upright, in no distress with discussed all findings at length patient has improved patient will follow-up with primary care MDM  Patient with history  of diabetes presents with home concerns of nausea, vomiting. History the patient's nausea is controlled, he has no additional vomiting episodes after perforation of fluids, antiemetic. Patient's labs are reassuring, no evidence for DKA, substantial hyperglycemia. No evidence for infection. Patient discharged in stable condition to follow-up with primary care.  Carmin Muskrat, MD 10/05/15 [phone]

## 2015-10-05 NOTE — ED Notes (Signed)
Pt. Is unable to urinate at this time.

## 2015-10-22 DIAGNOSIS — E113599 Type 2 diabetes mellitus with proliferative diabetic retinopathy without macular edema, unspecified eye: Secondary | ICD-10-CM | POA: Diagnosis not present

## 2015-10-22 DIAGNOSIS — H35371 Puckering of macula, right eye: Secondary | ICD-10-CM | POA: Diagnosis not present

## 2015-10-22 DIAGNOSIS — H4050X3 Glaucoma secondary to other eye disorders, unspecified eye, severe stage: Secondary | ICD-10-CM | POA: Diagnosis not present

## 2015-10-22 DIAGNOSIS — E113591 Type 2 diabetes mellitus with proliferative diabetic retinopathy without macular edema, right eye: Secondary | ICD-10-CM | POA: Diagnosis not present

## 2015-11-05 ENCOUNTER — Ambulatory Visit: Payer: Medicare Other | Admitting: Internal Medicine

## 2015-11-15 ENCOUNTER — Ambulatory Visit: Payer: Medicare Other | Admitting: Internal Medicine

## 2015-11-23 ENCOUNTER — Encounter: Payer: Self-pay | Admitting: Internal Medicine

## 2015-11-23 ENCOUNTER — Ambulatory Visit: Payer: Medicare Other | Attending: Internal Medicine | Admitting: Internal Medicine

## 2015-11-23 VITALS — BP 128/74 | HR 97 | Temp 97.6°F | Resp 16 | Ht 70.0 in | Wt 209.6 lb

## 2015-11-23 DIAGNOSIS — Z794 Long term (current) use of insulin: Secondary | ICD-10-CM | POA: Diagnosis not present

## 2015-11-23 DIAGNOSIS — Z1159 Encounter for screening for other viral diseases: Secondary | ICD-10-CM | POA: Diagnosis not present

## 2015-11-23 DIAGNOSIS — I10 Essential (primary) hypertension: Secondary | ICD-10-CM | POA: Diagnosis not present

## 2015-11-23 DIAGNOSIS — E119 Type 2 diabetes mellitus without complications: Secondary | ICD-10-CM | POA: Diagnosis not present

## 2015-11-23 DIAGNOSIS — Z79899 Other long term (current) drug therapy: Secondary | ICD-10-CM | POA: Diagnosis not present

## 2015-11-23 DIAGNOSIS — R600 Localized edema: Secondary | ICD-10-CM | POA: Diagnosis not present

## 2015-11-23 DIAGNOSIS — R609 Edema, unspecified: Secondary | ICD-10-CM | POA: Diagnosis not present

## 2015-11-23 DIAGNOSIS — E785 Hyperlipidemia, unspecified: Secondary | ICD-10-CM

## 2015-11-23 DIAGNOSIS — Z7982 Long term (current) use of aspirin: Secondary | ICD-10-CM | POA: Diagnosis not present

## 2015-11-23 DIAGNOSIS — E1165 Type 2 diabetes mellitus with hyperglycemia: Secondary | ICD-10-CM | POA: Diagnosis not present

## 2015-11-23 LAB — POCT GLYCOSYLATED HEMOGLOBIN (HGB A1C): HEMOGLOBIN A1C: 7.9

## 2015-11-23 LAB — GLUCOSE, POCT (MANUAL RESULT ENTRY): POC GLUCOSE: 244 mg/dL — AB (ref 70–99)

## 2015-11-23 MED ORDER — LISINOPRIL 10 MG PO TABS: 10.0000 mg | ORAL_TABLET | Freq: Every day | ORAL | Status: DC

## 2015-11-23 MED ORDER — ATORVASTATIN CALCIUM 40 MG PO TABS: ORAL_TABLET | ORAL | Status: DC

## 2015-11-23 NOTE — Patient Instructions (Signed)
Please reschedule endocrinology exam and colonoscopy

## 2015-11-23 NOTE — Progress Notes (Signed)
Patient ID: Derek Lowery, male   DOB: 04-08-1956, 60 y.o.   MRN: [id number] SUBJECTIVE: 60 y.o. male for follow up of diabetes. Diabetic Review of Systems - medication compliance: compliant all of the time, diabetic diet compliance: compliant all of the time, further diabetic ROS: no polyuria or polydipsia, no chest pain, dyspnea or TIA's, no numbness, tingling or pain in extremities, no hypoglycemia, last eye exam approximately 1 month ago. Has been to Endocrinology one time. Taking Lantus in the AM now, d/ced off Glipizide and switched to Humalog 2 units BID. Has not returned because he cannot remember the address and name of doctors office.  Reports that he cancelled his Colonoscopy because he got scared. He will reschedule soon.  Current Outpatient Prescriptions  Medication Sig Dispense Refill  . aspirin EC 81 MG tablet Take 1 tablet (81 mg total) by mouth daily. 30 tablet 4  . atorvastatin (LIPITOR) 40 MG tablet TAKE 1 TABLET BY MOUTH EVERY DAY FOR CHOLESTEROL 90 tablet 0  . Cinnamon 500 MG capsule Take 500 mg by mouth daily.     . dorzolamide-timolol (COSOPT) 22.3-6.8 MG/ML ophthalmic solution Place 1 drop into both eyes 2 (two) times daily.    Marland Kitchen gabapentin (NEURONTIN) 300 MG capsule TAKE ONE CAPSULE BY MOUTH EVERY DAY AT BEDTIME 90 capsule 0  . glucose blood (TRUETEST TEST) test strip Check sugar 3 times per day for E11.65 100 each 12  . glucose monitoring kit (FREESTYLE) monitoring kit 1 each by Does not apply route as needed for other. 1 each 0  . Insulin Glargine (LANTUS) 100 UNIT/ML Solostar Pen Inject 25 Units into the skin every morning. 15 mL 4  . insulin lispro (HUMALOG KWIKPEN) 100 UNIT/ML KiwkPen Inject 0.02 mLs (2 Units total) into the skin 2 (two) times daily with a meal. Breakfast and supper, and pen needles 3/day 15 mL 11  . Insulin Syringe-Needle U-100 (SAFETY-GLIDE 0.3CC SYR 29GX1/2) 29G X 1/2" 0.3 ML MISC Use as directed. 200 each 3  . lisinopril (PRINIVIL,ZESTRIL) 10 MG  tablet Take 1 tablet (10 mg total) by mouth daily. for blood pressure 30 tablet 5  . Multiple Vitamins-Minerals (MULTIVITAMIN WITH MINERALS) tablet Take 1 tablet by mouth daily.    . ondansetron (ZOFRAN ODT) 4 MG disintegrating tablet Take 1 tablet (4 mg total) by mouth every 8 (eight) hours as needed for nausea or vomiting. 20 tablet 0  . vitamin C (VITAMIN C) 500 MG tablet Take 1 tablet (500 mg total) by mouth 2 (two) times daily.     No current facility-administered medications for this visit.  Review of Systems  Eyes: Positive for blurred vision.  Respiratory: Negative for cough and sputum production.   Cardiovascular: Positive for leg swelling.  All other systems reviewed and are negative.  OBJECTIVE: Appearance: alert, well appearing, and in no distress, oriented to person, place, and time and overweight. BP 128/74 mmHg  Pulse 97  Temp(Src) 97.6 F (36.4 C) (Oral)  Resp 16  Ht _0  (1.778 m)  Wt 209 lb 9.6 oz (95.074 kg)  BMI 30.07 kg/m2  SpO2 100%  Physical Exam  Constitutional: He is oriented to person, place, and time.  Cardiovascular: Normal rate, regular rhythm and normal heart sounds.   Pulses:      Dorsalis pedis pulses are 2+ on the right side, and 2+ on the left side.       Posterior tibial pulses are 2+ on the right side, and 2+ on the left side.  Pulmonary/Chest: Effort normal and breath sounds normal.  Musculoskeletal: He exhibits edema (2+ pitting bilaterally).  Feet:  Right Foot:  Skin Integrity: Positive for dry skin. Negative for skin breakdown.  Left Foot:  Skin Integrity: Positive for dry skin. Negative for skin breakdown.  Neurological: He is alert and oriented to person, place, and time.    ASSESSMENT: Jamason was seen today for follow-up, hypertension and diabetes.  Diagnoses and all orders for this visit:  Poorly controlled type 2 diabetes mellitus (HCC) -     Glucose (CBG) -     POCT A1C -     Microalbumin, urine -     Lipid panel;  Future Diabetes has improved significantly but he still is not at goal. I recommend he continues to follow up with Endocrinology soon. He did not bring sugar log for me to make any changes today. Discussed long term complications of diabetes with patient. I have congratulated him on his improvements today.  Essential hypertension, benign -    Refilled lisinopril (PRINIVIL,ZESTRIL) 10 MG tablet; Take 1 tablet (10 mg total) by mouth daily. for blood pressure Patient blood pressure is stable and may continue on current medication.  Education on diet, exercise, and modifiable risk factors discussed. Will obtain appropriate labs as needed. Will follow up in 3-6 months.   Peripheral edema  Patient has a very high sodium diet when we have discussed. He states that he sits around all day, I recommend he elevated his legs in order to help with fluid.   HLD (hyperlipidemia) -     Refill atorvastatin (LIPITOR) 40 MG tablet; TAKE 1 TABLET BY MOUTH EVERY DAY FOR CHOLESTEROL Education provided on proper lifestyle changes in order to lower cholesterol. Patient advised to maintain healthy weight and to keep total fat intake at 25-35% of total calories and carbohydrates 50-60% of total daily calories. Explained how high cholesterol places patient at risk for heart disease. Patient placed on appropriate medication and repeat labs in 6 months   Need for hepatitis C screening test -     Hepatitis panel, acute; Future I have explained that he may be charged for this test by Medicare if not approved but he may file a appeal if charged because this is a highly recommended test for his age range.     PLAN: See orders for this visit as documented in the electronic medical record. Issues reviewed with him: referral to Diabetic Education department, low cholesterol diet, weight control and daily exercise discussed, home glucose monitoring emphasized and long term diabetic complications discussed.  Return in about 1  week (around 11/30/2015) for Lab Visit and 3 mo PCP DM.  Lance Bosch, NP 11/23/2015 11:00 AM

## 2015-11-23 NOTE — Progress Notes (Signed)
Patient here for f/up DM and HTN. Patient reports feeling all right today.  Patient requesting refill of pin needles.  Patient reports taking meds this morning

## 2015-11-24 LAB — MICROALBUMIN, URINE: MICROALB UR: 0.7 mg/dL

## 2015-11-29 ENCOUNTER — Ambulatory Visit: Payer: Medicare Other | Attending: Internal Medicine

## 2015-11-29 DIAGNOSIS — Z1159 Encounter for screening for other viral diseases: Secondary | ICD-10-CM | POA: Insufficient documentation

## 2015-11-29 DIAGNOSIS — B182 Chronic viral hepatitis C: Secondary | ICD-10-CM | POA: Diagnosis not present

## 2015-11-29 DIAGNOSIS — E1165 Type 2 diabetes mellitus with hyperglycemia: Secondary | ICD-10-CM | POA: Diagnosis not present

## 2015-11-29 DIAGNOSIS — Z1321 Encounter for screening for nutritional disorder: Secondary | ICD-10-CM

## 2015-11-29 LAB — LIPID PANEL
CHOLESTEROL: 97 mg/dL — AB (ref 125–200)
HDL: 30 mg/dL — AB (ref >=40)
LDL Cholesterol: 48 mg/dL (ref <130)
TRIGLYCERIDES: 96 mg/dL (ref <150)
Total CHOL/HDL Ratio: 3.2 Ratio (ref <=5.0)
VLDL: 19 mg/dL (ref <30)

## 2015-11-29 NOTE — Progress Notes (Signed)
Patient's here for lab visit only.

## 2015-11-29 NOTE — Patient Instructions (Signed)
Patient's here for lab visit only.

## 2015-11-30 LAB — HEPATITIS PANEL, ACUTE
HCV Ab: NEGATIVE
HEP A IGM: NONREACTIVE
HEP B S AG: NEGATIVE
Hep B C IgM: NONREACTIVE

## 2015-12-03 ENCOUNTER — Telehealth: Payer: Self-pay

## 2015-12-03 NOTE — Telephone Encounter (Signed)
-----   Message from Lance Bosch, NP sent at 11/30/2015  7:53 AM EDT ----- Negative for Hepatitis. Cholesterol looks great

## 2015-12-03 NOTE — Telephone Encounter (Signed)
Patient mailbox is full. Couldn't leave a message.   Note to patient: ----- Message from Lance Bosch, NP sent at 11/30/2015 7:53 AM EDT -----  Negative for Hepatitis. Cholesterol looks great

## 2015-12-08 ENCOUNTER — Other Ambulatory Visit: Payer: Self-pay | Admitting: Internal Medicine

## 2015-12-11 NOTE — Telephone Encounter (Signed)
Contacted patient, patient verified name and DOB. Patient was given lab results, verbalize understanding with no further questions.

## 2015-12-19 ENCOUNTER — Other Ambulatory Visit: Payer: Self-pay | Admitting: Internal Medicine

## 2015-12-28 ENCOUNTER — Other Ambulatory Visit: Payer: Self-pay | Admitting: Internal Medicine

## 2015-12-31 ENCOUNTER — Other Ambulatory Visit: Payer: Self-pay | Admitting: Internal Medicine

## 2016-01-02 ENCOUNTER — Other Ambulatory Visit: Payer: Self-pay | Admitting: Endocrinology

## 2016-01-04 NOTE — Telephone Encounter (Signed)
Please refill x 1 Ov is due  

## 2016-01-04 NOTE — Telephone Encounter (Signed)
Please advise if ok to refill. Last office visit 04/18/2015.

## 2016-01-30 DIAGNOSIS — H4050X2 Glaucoma secondary to other eye disorders, unspecified eye, moderate stage: Secondary | ICD-10-CM | POA: Diagnosis not present

## 2016-01-30 DIAGNOSIS — H2511 Age-related nuclear cataract, right eye: Secondary | ICD-10-CM | POA: Diagnosis not present

## 2016-01-31 DIAGNOSIS — E113591 Type 2 diabetes mellitus with proliferative diabetic retinopathy without macular edema, right eye: Secondary | ICD-10-CM | POA: Diagnosis not present

## 2016-01-31 DIAGNOSIS — H4050X3 Glaucoma secondary to other eye disorders, unspecified eye, severe stage: Secondary | ICD-10-CM | POA: Diagnosis not present

## 2016-01-31 DIAGNOSIS — H35371 Puckering of macula, right eye: Secondary | ICD-10-CM | POA: Diagnosis not present

## 2016-01-31 DIAGNOSIS — E113511 Type 2 diabetes mellitus with proliferative diabetic retinopathy with macular edema, right eye: Secondary | ICD-10-CM | POA: Diagnosis not present

## 2016-01-31 DIAGNOSIS — H211X1 Other vascular disorders of iris and ciliary body, right eye: Secondary | ICD-10-CM | POA: Diagnosis not present

## 2016-02-06 ENCOUNTER — Encounter: Payer: Self-pay | Admitting: Internal Medicine

## 2016-02-06 ENCOUNTER — Telehealth: Payer: Self-pay | Admitting: Internal Medicine

## 2016-02-06 ENCOUNTER — Ambulatory Visit (HOSPITAL_BASED_OUTPATIENT_CLINIC_OR_DEPARTMENT_OTHER): Payer: Medicare Other | Admitting: Internal Medicine

## 2016-02-06 ENCOUNTER — Ambulatory Visit (HOSPITAL_COMMUNITY)
Admission: RE | Admit: 2016-02-06 | Discharge: 2016-02-06 | Disposition: A | Discharge status: Home / Self Care | Payer: Medicare Other | Source: Ambulatory Visit | Attending: Internal Medicine | Admitting: Internal Medicine

## 2016-02-06 VITALS — BP 122/70 | HR 63 | Temp 97.5°F | Wt 212.0 lb

## 2016-02-06 DIAGNOSIS — M25512 Pain in left shoulder: Secondary | ICD-10-CM | POA: Diagnosis not present

## 2016-02-06 DIAGNOSIS — E1165 Type 2 diabetes mellitus with hyperglycemia: Secondary | ICD-10-CM | POA: Diagnosis not present

## 2016-02-06 DIAGNOSIS — E13319 Other specified diabetes mellitus with unspecified diabetic retinopathy without macular edema: Secondary | ICD-10-CM

## 2016-02-06 DIAGNOSIS — M19012 Primary osteoarthritis, left shoulder: Secondary | ICD-10-CM | POA: Diagnosis not present

## 2016-02-06 MED ORDER — DICLOFENAC SODIUM 1 % TD GEL: 2.0000 g | Freq: Four times a day (QID) | TRANSDERMAL | Status: DC

## 2016-02-06 NOTE — Patient Instructions (Addendum)
Alternate between heat and ice of Left shoulder   RICE for Routine Care of Injuries Theroutine careofmanyinjuriesincludes rest, ice, compression, and elevation (RICE therapy). RICE therapy is often recommended for injuries to soft tissues, such as a muscle strain, ligament injuries, bruises, and overuse injuries. It can also be used for some bony injuries. Using RICE therapy can help to relieve pain, lessen swelling, and enable your body to heal. Rest Rest is required to allow your body to heal. This usually involves reducing your normal activities and avoiding use of the injured part of your body. Generally, you can return to your normal activities when you are comfortable and have been given permission by your health care provider. Ice Icing your injury helps to keep the swelling down, and it lessens pain. Do not apply ice directly to your skin.  Put ice in a plastic bag.  Place a towel between your skin and the bag.  Leave the ice on for 20 minutes, 2-3 times a day. Do this for as long as you are directed by your health care provider. Compression Compression means putting pressure on the injured area. Compression helps to keep swelling down, gives support, and helps with discomfort. Compression may be done with an elastic bandage. If an elastic bandage has been applied, follow these general tips:  Remove and reapply the bandage every 3-4 hours or as directed by your health care provider.  Make sure the bandage is not wrapped too tightly, because this can cut off circulation. If part of your body beyond the bandage becomes blue, numb, cold, swollen, or more painful, your bandage is most likely too tight. If this occurs, remove your bandage and reapply it more loosely.  See your health care provider if the bandage seems to be making your problems worse rather than better. Elevation Elevation means keeping the injured area raised. This helps to lessen swelling and decrease pain. If  possible, your injured area should be elevated at or above the level of your heart or the center of your chest. [place]? You should seek medical care if:  Your pain and swelling continue.  Your symptoms are getting worse rather than improving. These symptoms may indicate that further evaluation or further X-rays are needed. Sometimes, X-rays may not show a small broken bone (fracture) until a number of days later. Make a follow-up appointment with your health care provider. WHEN SHOULD I SEEK IMMEDIATE MEDICAL CARE? You should seek immediate medical care if:  You have sudden severe pain at or below the area of your injury.  You have redness or increased swelling around your injury.  You have tingling or numbness at or below the area of your injury that does not improve after you remove the elastic bandage.   This information is not intended to replace advice given to you by your health care provider. Make sure you discuss any questions you have with your health care provider.   Document Released: 11/16/2000 Document Revised: 04/25/2015 Document Reviewed: 07/12/2014 Elsevier Interactive Patient Education 2016 Elsevier Inc[employer]  - Shoulder Pain The shoulder is the joint that connects your arm to your body. Muscles and band-like tissues that connect bones to muscles (tendons) hold the joint together. Shoulder pain is felt if an injury or medical problem affects one or more parts of the shoulder. HOME CARE   Put ice on the sore area.  Put ice in a plastic bag.  Place a towel between your skin and the bag.  Leave the ice on for 15-20 minutes, 03-04 times a day for the first 2 days.  Stop using cold packs if they do not help with the pain.  If you were given something to keep your shoulder from moving (sling; shoulder immobilizer), wear it as told. Only take it off to shower or bathe.  Move your arm as little as possible, but keep your hand moving to prevent  puffiness (swelling).  Squeeze a soft ball or foam pad as much as possible to help prevent swelling.  Take medicine as told by your doctor. GET HELP IF:  You have progressing new pain in your arm, hand, or fingers.  Your hand or fingers get cold.  Your medicine does not help lessen your pain. GET HELP RIGHT AWAY IF:   Your arm, hand, or fingers are numb or tingling.  Your arm, hand, or fingers are puffy (swollen), painful, or turn white or blue. MAKE SURE YOU:   Understand these instructions.  Will watch your condition.  Will get help right away if you are not doing well or get worse.   This information is not intended to replace advice given to you by your health care provider. Make sure you discuss any questions you have with your health care provider.   Document Released: 01/21/2008 Document Revised: 08/25/2014 Document Reviewed: 11/27/2014 Elsevier Interactive Patient Education [employer].

## 2016-02-06 NOTE — Progress Notes (Signed)
Derek Lowery, is a 60 y.o. male  BPZ:[id number]  EUM:[id number]  DOB - 1956-03-25  Chief Complaint  Patient presents with  . Shoulder Pain    Left x 1 mth no injuries/falls        Subjective:   Derek Lowery is a 60 y.o. male , Right handed,here today for a follow up visit for left shoulder pain, ongoing for 5-6 wks. Pt w/ signif PMHX of dm2, retionopathy and htn/hld.  Pt states that 5- 6 weeks ago, he woke up w/ the shoulder pain.  He notes that when not moving the shoulder, it does not hurt.  But when he tries to lift the shoulder up above 90deg, he gets pain in the anterior aspect of shoulder and scapula region.  Denies trauma.   Patient has No headache, No chest pain, No abdominal pain - No Nausea, No new weakness tingling or numbness, No Cough - SOB.  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 Hypercholesteremia   . Type 2 diabetes mellitus ([place]) 1990  . Internal hemorrhoids   . Essential hypertension, benign   . Noncompliance   . CAD (coronary artery disease)     NSTEMI 06/2011:  LHC 07/18/11: Proximal diagonal 60%, distal LAD with a diabetic appearance and 60% stenosis, OM2 with an occluded superior branch and an inferior branch with 90%, EF 55% with inferior hypokinesis.  PCI: Promus DES to the OM2 inferior branch.  This vessel provides collaterals to the superior branch which remained occluded.  Echocardiogram 07/18/11: EF 60%, normal wall motion.  . Glaucoma     MEDICATIONS AT HOME: Prior to Admission medications   Medication Sig Start Date End Date Taking? Authorizing Provider  aspirin EC 81 MG tablet Take 1 tablet (81 mg total) by mouth daily. 05/19/14  Yes Lance Bosch, NP  atorvastatin (LIPITOR) 40 MG tablet TAKE 1 TABLET BY MOUTH EVERY DAY FOR CHOLESTEROL 11/23/15  Yes Lance Bosch, NP  BD PEN NEEDLE NANO U/F 32G X 4 MM MISC USE AS DIRECTED  12/31/15  Yes Tresa Garter, MD  dorzolamide-timolol (COSOPT) 22.3-6.8 MG/ML ophthalmic solution Place 1 drop into both eyes 2 (two) times daily.   Yes Historical Provider, MD  gabapentin (NEURONTIN) 300 MG capsule TAKE ONE CAPSULE BY MOUTH EVERY DAY AT BEDTIME 12/10/15  Yes Olugbemiga E Doreene Burke, MD  glucose blood (TRUETEST TEST) test strip Check sugar 3 times per day for E11.65 03/26/15  Yes Lance Bosch, NP  glucose monitoring kit (FREESTYLE) monitoring kit 1 each by Does not apply route as needed for other. 05/10/14  Yes Modena Jansky, MD  Insulin Glargine (LANTUS SOLOSTAR) 100 UNIT/ML Solostar Pen Inject 25 units every morning **MUST SCHEDULE FOLLOW UP FOR FURTHER REFILLS** 01/04/16  Yes Renato Shin, MD  insulin lispro (HUMALOG KWIKPEN) 100 UNIT/ML KiwkPen Inject 0.02 mLs (2 Units total) into the skin 2 (two) times daily with a meal. Breakfast and supper, and pen needles 3/day 04/18/15  Yes Renato Shin, MD  Insulin Syringe-Needle U-100 (SAFETY-GLIDE 0.3CC SYR 29GX1/2) 29G X 1/2" 0.3 ML MISC Use as directed. 11/09/14  Yes Lance Bosch, NP  lisinopril (PRINIVIL,ZESTRIL) 10 MG tablet Take 1 tablet (10 mg total) by mouth daily. for blood pressure 11/23/15  Yes Lance Bosch, NP  Multiple Vitamins-Minerals (MULTIVITAMIN WITH MINERALS) tablet Take 1 tablet by mouth daily.   Yes Historical Provider, MD  ondansetron (ZOFRAN ODT) 4 MG disintegrating tablet Take 1 tablet (4 mg total) by mouth every 8 (eight) hours as needed for nausea or vomiting. 10/05/15  Yes Carmin Muskrat, MD  vitamin C (VITAMIN C) 500 MG tablet Take 1 tablet (500 mg total) by mouth 2 (two) times daily. 05/10/14  Yes Modena Jansky, MD  Cinnamon 500 MG capsule Take 500 mg by mouth daily. Reported on 02/06/2016    Historical Provider, MD  diclofenac sodium (VOLTAREN) 1 % GEL Apply 2 g topically 4 (four) times daily. 02/06/16   Maren Reamer, MD     Objective:   Filed Vitals:   02/06/16 0950  BP: 122/70  Pulse: 63  Temp:  97.5 F (36.4 C)  TempSrc: Oral  Weight: 212 lb (96.163 kg)    Exam General appearance : Awake, alert, not in any distress. Speech Clear. Not toxic looking, elderly appearing. HEENT: Atraumatic and Normocephalic,  Neck: supple, no JVD. No cervical lymphadenopathy.  Chest:Good air entry bilaterally, no added sounds. Shoulder: no obvious muscle deformities, no splinting noted. Pt able to extend right shoulder up over head. Only able to passively move shoulder to 90deg. Before unable to go further due to left scapular and biceps pain. On resistance extension/flexion of shoulder, pain reproducible to left scapular and anterior biceps as well.  No palpable masses.  No articular pain specifically on palpation thought.  CVS: S1 S2 regular, no murmurs/gallups or rubs. Abdomen: Bowel sounds active, Non tender. Extremities: B/L Lower Ext shows no edema, both legs are warm to touch Neurology: Awake alert, and oriented X 3, CN II-XII grossly intact, Non focal Skin:No Rash  Data Review Lab Results  Component Value Date   HGBA1C 7.9 11/23/2015   HGBA1C 10.50 03/26/2015   HGBA1C 9.30 12/27/2014    Depression screen PHQ 2/9 02/06/2016 11/23/2015 05/19/2014  Decreased Interest 0 0 0  Down, Depressed, Hopeless 0 0 0  PHQ - 2 Score 0 0 0      Assessment & Plan   1. Left shoulder pain, ddx tendonitis, frozen shoulder, vs oa RICE recd, alterate btw heat/ice, volteren gel prn.  Did not rx steroid po given pt's dm. - DG Shoulder Left; Future - Sling rx, we do not have sling in our clinic.  2. Poorly controlled type 2 diabetes mellitus ([facility]) - cbg around 112 in clinic today - f/u in 1 month for dm chk.  3. Retinopathy due to secondary diabetes ([place]) - per pt, has appt w/ Optho tomorrow for laser procedure.     Patient have been counseled extensively about nutrition and exercise  Return in about 4 weeks (around 03/05/2016) for dm chk..  The patient was given clear instructions to go to ER  or return to medical center if symptoms don't improve, worsen or new problems develop. The patient verbalized understanding. The patient was told to call to get lab results if they haven't heard anything in the next week.   This note has been created with Surveyor, quantity. Any transcriptional errors are unintentional.   Maren Reamer, MD, [place] [facility] [place], [place]   02/06/2016, 10:15 AM

## 2016-02-06 NOTE — Telephone Encounter (Signed)
Called pt w/ results. Confirmed dob.  dw right shoulder xrays w/ him. He wants to try rice/heat/voltern gell first b/4 MRI.

## 2016-02-07 DIAGNOSIS — E113591 Type 2 diabetes mellitus with proliferative diabetic retinopathy without macular edema, right eye: Secondary | ICD-10-CM | POA: Diagnosis not present

## 2016-02-11 ENCOUNTER — Other Ambulatory Visit: Payer: Self-pay | Admitting: Internal Medicine

## 2016-02-11 MED ORDER — CAPSAICIN-MENTHOL-METHYL SAL 0.025-1-12 % EX CREA: 1.0000 "application " | TOPICAL_CREAM | Freq: Four times a day (QID) | CUTANEOUS | Status: DC | PRN

## 2016-03-06 DIAGNOSIS — E113599 Type 2 diabetes mellitus with proliferative diabetic retinopathy without macular edema, unspecified eye: Secondary | ICD-10-CM | POA: Diagnosis not present

## 2016-03-06 DIAGNOSIS — H4050X3 Glaucoma secondary to other eye disorders, unspecified eye, severe stage: Secondary | ICD-10-CM | POA: Diagnosis not present

## 2016-03-06 DIAGNOSIS — E113511 Type 2 diabetes mellitus with proliferative diabetic retinopathy with macular edema, right eye: Secondary | ICD-10-CM | POA: Diagnosis not present

## 2016-03-06 DIAGNOSIS — E113591 Type 2 diabetes mellitus with proliferative diabetic retinopathy without macular edema, right eye: Secondary | ICD-10-CM | POA: Diagnosis not present

## 2016-03-07 ENCOUNTER — Other Ambulatory Visit: Payer: Self-pay | Admitting: Internal Medicine

## 2016-03-07 NOTE — Telephone Encounter (Signed)
Refill Gabapentin

## 2016-03-10 ENCOUNTER — Other Ambulatory Visit: Payer: Self-pay | Admitting: Internal Medicine

## 2016-03-10 NOTE — Telephone Encounter (Signed)
Rx requests

## 2016-03-13 ENCOUNTER — Other Ambulatory Visit: Payer: Self-pay | Admitting: Internal Medicine

## 2016-03-25 ENCOUNTER — Ambulatory Visit: Payer: Medicare Other | Attending: Internal Medicine | Admitting: Internal Medicine

## 2016-03-25 ENCOUNTER — Encounter: Payer: Self-pay | Admitting: Internal Medicine

## 2016-03-25 VITALS — BP 124/77 | HR 65 | Temp 98.0°F | Resp 16 | Wt 210.2 lb

## 2016-03-25 DIAGNOSIS — E785 Hyperlipidemia, unspecified: Secondary | ICD-10-CM

## 2016-03-25 DIAGNOSIS — Z7982 Long term (current) use of aspirin: Secondary | ICD-10-CM | POA: Diagnosis not present

## 2016-03-25 DIAGNOSIS — Z125 Encounter for screening for malignant neoplasm of prostate: Secondary | ICD-10-CM

## 2016-03-25 DIAGNOSIS — M25512 Pain in left shoulder: Secondary | ICD-10-CM | POA: Insufficient documentation

## 2016-03-25 DIAGNOSIS — E7800 Pure hypercholesterolemia, unspecified: Secondary | ICD-10-CM | POA: Insufficient documentation

## 2016-03-25 DIAGNOSIS — I7090 Unspecified atherosclerosis: Secondary | ICD-10-CM | POA: Diagnosis not present

## 2016-03-25 DIAGNOSIS — H409 Unspecified glaucoma: Secondary | ICD-10-CM | POA: Insufficient documentation

## 2016-03-25 DIAGNOSIS — Z1211 Encounter for screening for malignant neoplasm of colon: Secondary | ICD-10-CM

## 2016-03-25 DIAGNOSIS — Z79899 Other long term (current) drug therapy: Secondary | ICD-10-CM | POA: Diagnosis not present

## 2016-03-25 DIAGNOSIS — Z794 Long term (current) use of insulin: Secondary | ICD-10-CM | POA: Insufficient documentation

## 2016-03-25 DIAGNOSIS — Z114 Encounter for screening for human immunodeficiency virus [HIV]: Secondary | ICD-10-CM | POA: Diagnosis not present

## 2016-03-25 DIAGNOSIS — K648 Other hemorrhoids: Secondary | ICD-10-CM | POA: Diagnosis not present

## 2016-03-25 DIAGNOSIS — I2510 Atherosclerotic heart disease of native coronary artery without angina pectoris: Secondary | ICD-10-CM

## 2016-03-25 DIAGNOSIS — I10 Essential (primary) hypertension: Secondary | ICD-10-CM

## 2016-03-25 DIAGNOSIS — K029 Dental caries, unspecified: Secondary | ICD-10-CM

## 2016-03-25 DIAGNOSIS — E1142 Type 2 diabetes mellitus with diabetic polyneuropathy: Secondary | ICD-10-CM | POA: Diagnosis not present

## 2016-03-25 DIAGNOSIS — IMO0001 Reserved for inherently not codable concepts without codable children: Secondary | ICD-10-CM

## 2016-03-25 LAB — CBC WITH DIFFERENTIAL/PLATELET
BASOS PCT: 0 %
Basophils Absolute: 0 cells/uL (ref 0–200)
EOS PCT: 5 %
Eosinophils Absolute: 355 cells/uL (ref 15–500)
HCT: 40.2 % (ref 38.5–50.0)
Hemoglobin: 13.1 g/dL — ABNORMAL LOW (ref 13.2–17.1)
LYMPHS PCT: 28 %
Lymphs Abs: 1988 cells/uL (ref 850–3900)
MCH: 28.1 pg (ref 27.0–33.0)
MCHC: 32.6 g/dL (ref 32.0–36.0)
MCV: 86.1 fL (ref 80.0–100.0)
MONO ABS: 568 {cells}/uL (ref 200–950)
MONOS PCT: 8 %
MPV: 10.2 fL (ref 7.5–12.5)
NEUTROS PCT: 59 %
Neutro Abs: 4189 cells/uL (ref 1500–7800)
PLATELETS: 166 10*3/uL (ref 140–400)
RBC: 4.67 MIL/uL (ref 4.20–5.80)
RDW: 13.9 % (ref 11.0–15.0)
WBC: 7.1 10*3/uL (ref 3.8–10.8)

## 2016-03-25 LAB — PSA: PSA: 0.7 ng/mL (ref <=4.0)

## 2016-03-25 LAB — GLUCOSE, POCT (MANUAL RESULT ENTRY): POC Glucose: 174 mg/dl — AB (ref 70–99)

## 2016-03-25 LAB — POCT GLYCOSYLATED HEMOGLOBIN (HGB A1C): HEMOGLOBIN A1C: 7.4

## 2016-03-25 MED ORDER — ATORVASTATIN CALCIUM 40 MG PO TABS: ORAL_TABLET | ORAL | 3 refills | Status: DC

## 2016-03-25 MED ORDER — INSULIN GLARGINE 100 UNIT/ML SOLOSTAR PEN: PEN_INJECTOR | SUBCUTANEOUS | 3 refills | Status: DC

## 2016-03-25 MED ORDER — INSULIN LISPRO 100 UNIT/ML (KWIKPEN): 2.0000 [IU] | PEN_INJECTOR | Freq: Two times a day (BID) | SUBCUTANEOUS | 11 refills | Status: DC

## 2016-03-25 MED ORDER — ASPIRIN EC 81 MG PO TBEC: 81.0000 mg | DELAYED_RELEASE_TABLET | Freq: Every day | ORAL | 4 refills | Status: DC

## 2016-03-25 MED ORDER — LISINOPRIL 10 MG PO TABS: 10.0000 mg | ORAL_TABLET | Freq: Every day | ORAL | 5 refills | Status: DC

## 2016-03-25 NOTE — Progress Notes (Signed)
Derek Lowery, is a 60 y.o. male  YYQ:[id number]  UGQ:[id number]  DOB - 1956-08-11  Chief Complaint  Patient presents with  . Diabetes  . Shoulder Pain        Subjective:   Derek Lowery is a 60 y.o. male here today for a follow up visit, For left shoulder pain as well as diabetes follow-up, w/ hx of CAD sp stent 06/2011 w/ DES to OM2 inferior branch.  Patient states that he is eating a little bit better. Complains of loose stool sometimes, but tolerable.  Denies any chest pain, palpitations or shortness breath. Denies hemoptysis, hematemesis or hematuria. He does note that he has a difficulty with urinating sometimes and he is already noted this morning, so may have a difficult time giving [place] a urine sample today. Not smoke or drink.  Of note he still has the left shoulder pain, constant in nature, although he is not taking any medications. He is unable to lift his left arm up 90 he gets quite stiff.  Recommended MRI of the shoulder. However, he is extremely claustrophobic and not cannot tolerate the MRI   Patient has No headache, No chest pain, No abdominal pain - No Nausea, No new weakness tingling or numbness, No Cough - SOB.  No problems updated.  ALLERGIES: No Known Allergies  PAST MEDICAL HISTORY: Past Medical History:  Diagnosis Date  . CAD (coronary artery disease)    NSTEMI 06/2011:  LHC 07/18/11: Proximal diagonal 60%, distal LAD with a diabetic appearance and 60% stenosis, OM2 with an occluded superior branch and an inferior branch with 90%, EF 55% with inferior hypokinesis.  PCI: Promus DES to the OM2 inferior branch.  This vessel provides collaterals to the superior branch which remained occluded.  Echocardiogram 07/18/11: EF 60%, normal wall motion.  . Essential hypertension, benign   . Glaucoma   . Hypercholesteremia   . Internal hemorrhoids   . Noncompliance   . Type 2 diabetes mellitus (Trainer) 1990    MEDICATIONS AT HOME: Prior to Admission medications     Medication Sig Start Date End Date Taking? Authorizing Provider  aspirin EC 81 MG tablet Take 1 tablet (81 mg total) by mouth daily. 03/25/16  Yes Maren Reamer, MD  atorvastatin (LIPITOR) 40 MG tablet TAKE 1 TABLET BY MOUTH EVERY DAY FOR CHOLESTEROL 03/25/16  Yes Maren Reamer, MD  BD PEN NEEDLE NANO U/F 32G X 4 MM MISC USE AS DIRECTED 12/31/15  Yes Olugbemiga E Doreene Burke, MD  gabapentin (NEURONTIN) 300 MG capsule TAKE ONE CAPSULE BY MOUTH EVERY DAY AT BEDTIME 03/10/16  Yes Arnoldo Morale, MD  glucose blood (TRUETEST TEST) test strip Check sugar 3 times per day for E11.65 03/26/15  Yes Lance Bosch, NP  glucose monitoring kit (FREESTYLE) monitoring kit 1 each by Does not apply route as needed for other. 05/10/14  Yes Modena Jansky, MD  Insulin Glargine (LANTUS SOLOSTAR) 100 UNIT/ML Solostar Pen Inject 18 units every morning **MUST SCHEDULE FOLLOW UP FOR FURTHER REFILLS** 03/25/16  Yes Maren Reamer, MD  insulin lispro (HUMALOG KWIKPEN) 100 UNIT/ML KiwkPen Inject 0.02 mLs (2 Units total) into the skin 2 (two) times daily with a meal. Breakfast and supper, and pen needles 3/day 03/25/16  Yes Maren Reamer, MD  Insulin Syringe-Needle U-100 (SAFETY-GLIDE 0.3CC SYR 29GX1/2) 29G X 1/2" 0.3 ML MISC Use as directed. 11/09/14  Yes Lance Bosch, NP  lisinopril (PRINIVIL,ZESTRIL) 10 MG tablet Take 1 tablet (10 mg total) by mouth  daily. for blood pressure 03/25/16  Yes Maren Reamer, MD  Multiple Vitamins-Minerals (MULTIVITAMIN WITH MINERALS) tablet Take 1 tablet by mouth daily.   Yes Historical Provider, MD  ondansetron (ZOFRAN ODT) 4 MG disintegrating tablet Take 1 tablet (4 mg total) by mouth every 8 (eight) hours as needed for nausea or vomiting. 10/05/15  Yes Carmin Muskrat, MD  vitamin C (VITAMIN C) 500 MG tablet Take 1 tablet (500 mg total) by mouth 2 (two) times daily. 05/10/14  Yes Modena Jansky, MD  Capsaicin-Menthol-Methyl Sal (CAPSAICIN-METHYL SAL-MENTHOL) 0.025-1-12 % CREA Apply 1 application  topically 4 (four) times daily as needed. For aches/pains, apply to skin. May need gloves. Patient not taking: Reported on 03/25/2016 02/11/16   Maren Reamer, MD  Cinnamon 500 MG capsule Take 500 mg by mouth daily. Reported on 02/06/2016    Historical Provider, MD  dorzolamide-timolol (COSOPT) 22.3-6.8 MG/ML ophthalmic solution Place 1 drop into both eyes 2 (two) times daily.    Historical Provider, MD     Objective:   Vitals:   03/25/16 1156  BP: 124/77  Pulse: 65  Resp: 16  Temp: 98 F (36.7 C)  TempSrc: Oral  SpO2: 96%  Weight: 210 lb 3.2 oz (95.3 kg)    Exam General appearance : Awake, alert, not in any distress. Speech Clear. Not toxic looking, pale, pleasant. HEENT: Atraumatic and Normocephalic, pupils equally reactive to light. bilat tms clear. Neck: supple, no JVD.  Chest:Good air entry bilaterally, no added sounds. CVS: S1 S2 regular, no murmurs/gallups or rubs. Abdomen: Bowel sounds active, obese,  Non tender and not distended with no gaurding, rigidity or rebound. Extremities: B/L Lower Ext shows no edema, both legs are warm to touch. Foot exam: scaling skin bilat foot, w/ mild pedal edema, bilateral peripheral pulses 2+ (dorsalis pedis and post tibialis pulses), no ulcers noted/no ecchymosis, warm to touch, monofilament testing 0/3 bilat.  No c/c. Neurology: Awake alert, and oriented X 3, CN II-XII grossly intact, Non focal Skin:No Rash  Data Review Lab Results  Component Value Date   HGBA1C 7.4 03/25/2016   HGBA1C 7.9 11/23/2015   HGBA1C 10.50 03/26/2015    Depression screen PHQ 2/9 03/25/2016 02/06/2016 11/23/2015 05/19/2014  Decreased Interest 3 0 0 0  Down, Depressed, Hopeless 0 0 0 0  PHQ - 2 Score 3 0 0 0  Altered sleeping 1 - - -  Tired, decreased energy 1 - - -  Change in appetite 1 - - -  Feeling bad or failure about yourself  0 - - -  Trouble concentrating 0 - - -  Moving slowly or fidgety/restless 0 - - -  Suicidal thoughts 0 - - -  PHQ-9 Score 6 -  - -      Assessment & Plan   1. Diabetic polyneuropathy associated with type 2 diabetes mellitus (HCC) - reduce lantus to 18 units [place] qhs, continue humolog 2 units bid. - BASIC METABOLIC PANEL WITH GFR - CBC with Differential/Platelet - Microalbumin/Creatinine Ratio, Urine - Ambulatory referral to Podiatry - Glucose (CBG) - HgB A1c 7.4 (from 7.9)  2. HLD (hyperlipidemia) - atorvastatin (LIPITOR) 40 MG tablet; TAKE 1 TABLET BY MOUTH EVERY DAY FOR CHOLESTEROL  Dispense: 90 tablet; Refill: 3  3. Atherosclerosis of native coronary artery of native heart without angina pectoris - aspirin EC 81 MG tablet; Take 1 tablet (81 mg total) by mouth daily.  Dispense: 30 tablet; Refill: 4  4. Essential hypertension, benign - lisinopril (PRINIVIL,ZESTRIL) 10 MG tablet;  Take 1 tablet (10 mg total) by mouth daily. for blood pressure  Dispense: 30 tablet; Refill: 5  5. Screen for colon cancer - Ambulatory referral to Gastroenterology - encourage to keep appt this time, last time testing canceled per pt, dw pt importance of screening  6. Encounter for screening for HIV - HIV antibody (with reflex)  7. Screening PSA (prostate specific antigen) - PSA, Medicare  8. Cavities - Ambulatory referral to Dentistry     Patient have been counseled extensively about nutrition and exercise  Return in about 3 months (around 06/25/2016).  The patient was given clear instructions to go to ER or return to medical center if symptoms don't improve, worsen or new problems develop. The patient verbalized understanding. The patient was told to call to get lab results if they haven't heard anything in the next week.   This note has been created with Surveyor, quantity. Any transcriptional errors are unintentional.   Maren Reamer, MD, [place] [facility] [place], [place]   03/25/2016, 12:48 PM

## 2016-03-25 NOTE — Patient Instructions (Addendum)
Diabetes Mellitus and Food It is important for you to manage your blood sugar (glucose) level. Your blood glucose level can be greatly affected by what you eat. Eating healthier foods in the appropriate amounts throughout the day at about the same time each day will help you control your blood glucose level. It can also help slow or prevent worsening of your diabetes mellitus. Healthy eating may even help you improve the level of your blood pressure and reach or maintain a healthy weight.  General recommendations for healthful eating and cooking habits include:  Eating meals and snacks regularly. Avoid going long periods of time without eating to lose weight.  Eating a diet that consists mainly of plant-based foods, such as fruits, vegetables, nuts, legumes, and whole grains.  Using low-heat cooking methods, such as baking, instead of high-heat cooking methods, such as deep frying. Work with your dietitian to make sure you understand how to use the Nutrition Facts information on food labels. HOW CAN FOOD AFFECT ME? Carbohydrates Carbohydrates affect your blood glucose level more than any other type of food. Your dietitian will help you determine how many carbohydrates to eat at each meal and teach you how to count carbohydrates. Counting carbohydrates is important to keep your blood glucose at a healthy level, especially if you are using insulin or taking certain medicines for diabetes mellitus. Alcohol Alcohol can cause sudden decreases in blood glucose (hypoglycemia), especially if you use insulin or take certain medicines for diabetes mellitus. Hypoglycemia can be a life-threatening condition. Symptoms of hypoglycemia (sleepiness, dizziness, and disorientation) are similar to symptoms of having too much alcohol.  If your health care provider has given you approval to drink alcohol, do so in moderation and use the following guidelines:  Women should not have more than one drink per day, and men  should not have more than two drinks per day. One drink is equal to:  12 oz of beer.  5 oz of wine.  1 oz of hard liquor.  Do not drink on an empty stomach.  Keep yourself hydrated. Have water, diet soda, or unsweetened iced tea.  Regular soda, juice, and other mixers might contain a lot of carbohydrates and should be counted. WHAT FOODS ARE NOT RECOMMENDED? As you make food choices, it is important to remember that all foods are not the same. Some foods have fewer nutrients per serving than other foods, even though they might have the same number of calories or carbohydrates. It is difficult to get your body what it needs when you eat foods with fewer nutrients. Examples of foods that you should avoid that are high in calories and carbohydrates but low in nutrients include:  Trans fats (most processed foods list trans fats on the Nutrition Facts label).  Regular soda.  Juice.  Candy.  Sweets, such as cake, pie, doughnuts, and cookies.  Fried foods. WHAT FOODS CAN I EAT? Eat nutrient-rich foods, which will nourish your body and keep you healthy. The food you should eat also will depend on several factors, including:  The calories you need.  The medicines you take.  Your weight.  Your blood glucose level.  Your blood pressure level.  Your cholesterol level. You should eat a variety of foods, including:  Protein.  Lean cuts of meat.  Proteins low in saturated fats, such as fish, egg whites, and beans. Avoid processed meats.  Fruits and vegetables.  Fruits and vegetables that may help control blood glucose levels, such as apples, mangoes, and  yams.  Dairy products.  Choose fat-free or low-fat dairy products, such as milk, yogurt, and cheese.  Grains, bread, pasta, and rice.  Choose whole grain products, such as multigrain bread, whole oats, and brown rice. These foods may help control blood pressure.  Fats.  Foods containing healthful fats, such as nuts,  avocado, olive oil, canola oil, and fish. DOES EVERYONE WITH DIABETES MELLITUS HAVE THE SAME MEAL PLAN? Because every person with diabetes mellitus is different, there is not one meal plan that works for everyone. It is very important that you meet with a dietitian who will help you create a meal plan that is just right for you.   This information is not intended to replace advice given to you by your health care provider. Make sure you discuss any questions you have with your health care provider.   Document Released: 05/01/2005 Document Revised: 08/25/2014 Document Reviewed: 07/01/2013 Elsevier Interactive Patient Education 2016 [place] for Eating Away From Home If You Have Diabetes Controlling your level of blood glucose, also known as blood sugar, can be challenging. It can be even more difficult when you do not prepare your own meals. The following tips can help you manage your diabetes when you eat away from home. PLANNING AHEAD Plan ahead if you know you will be eating away from home:  Ask your health care provider how to time meals and medicine if you are taking insulin.  Make a list of restaurants near you that offer healthy choices. If they have a carry-out menu, take it home and plan what you will order ahead of time.  Look up the restaurant you want to eat at online. Many chain and fast-food restaurants list nutritional information online. Use this information to choose the healthiest options and to calculate how many carbohydrates will be in your meal.  Use a carbohydrate-counting book or mobile app to look up the carbohydrate content and serving size of the foods you want to eat.  Become familiar with serving sizes and learn to recognize how many servings are in a portion. This will allow you to estimate how many carbohydrates you can eat. FREE FOODS A "free food" is any food or drink that has less than 5 g of carbohydrates per serving. Free foods  include:  Many vegetables.  Hard boiled eggs.  Nuts or seeds.  Olives.  Cheeses.  Meats. These types of foods make good appetizer choices and are often available at salad bars. Lemon juice, vinegar, or a low-calorie salad dressing of fewer than 20 calories per serving can be used as a "free" salad dressing.  CHOICES TO REDUCE CARBOHYDRATES  Substitute nonfat sweetened yogurt with a sugar-free yogurt. Yogurt made from soy milk may also be used, but you will still want a sugar-free or plain option to choose a lower carbohydrate amount.  Ask your server to take away the bread basket or chips from your table.  Order fresh fruit. A salad bar often offers fresh fruit choices. Avoid canned fruit because it is usually packed in sugar or syrup.  Order a salad, and eat it without dressing. Or, create a "free" salad dressing.  Ask for substitutions. For example, instead of [place] fries, request an order of a vegetable such as salad, green beans, or broccoli. OTHER TIPS   If you take insulin, take the insulin once your food arrives to your table. This will ensure your insulin and food are timed correctly.  Ask your server about the  portion size before your order, and ask for a take-out box if the portion has more servings than you should have. When your food comes, leave the amount you should have on the plate, and put the rest in the take-out box.  Consider splitting an entree with someone and ordering a side salad.   This information is not intended to replace advice given to you by your health care provider. Make sure you discuss any questions you have with your health care provider.   Document Released: 08/04/2005 Document Revised: 04/25/2015 Document Reviewed: 11/01/2013 Elsevier Interactive Patient Education [employer].   -  Colonoscopy A colonoscopy is an exam to look at your colon. This exam can help find lumps (tumors), growths (polyps), bleeding, and redness and  puffiness (inflammation) in your colon.  BEFORE THE PROCEDURE  Ask your doctor about changing or stopping your regular medicines.  You may need to drink a large amount of a special liquid (oral bowel prep). You start drinking this the day before your procedure. It will cause you to have watery poop (stool). This cleans out your colon.  Do not eat or drink anything else once you have started the bowel prep, unless your doctor tells you it is safe to do so.  Make plans for someone to drive you home after the procedure. PROCEDURE  You will be given medicine to help you relax (sedative).  You will lie on your side with your knees bent.  A tube with a camera on the end is put in the opening of your butt (anus) and into your colon. Pictures are sent to a computer screen. Your doctor will look for anything that is not normal.  Your doctor may take a tissue sample (biopsy) from your colon to be looked at more closely.  The exam is finished when your doctor has viewed all of the colon. AFTER THE PROCEDURE  Do not drive for 24 hours after the exam.  You may have a small amount of blood in your poop. This is normal.  You may pass gas and have belly (abdominal) cramps. This is normal.  Ask when your test results will be ready. Make sure you get your test results.   This information is not intended to replace advice given to you by your health care provider. Make sure you discuss any questions you have with your health care provider.   Document Released: 09/06/2010 Document Revised: 08/09/2013 Document Reviewed: 04/11/2013 Elsevier Interactive Patient Education 2016 [place], Male A healthy lifestyle and preventative care can promote health and wellness.  Maintain regular health, dental, and eye exams.  Eat a healthy diet. Foods like vegetables, fruits, whole grains, low-fat dairy products, and lean protein foods contain the nutrients you need and are low in  calories. Decrease your intake of foods high in solid fats, added sugars, and salt. Get information about a proper diet from your health care provider, if necessary.  Regular physical exercise is one of the most important things you can do for your health. Most adults should get at least 150 minutes of moderate-intensity exercise (any activity that increases your heart rate and causes you to sweat) each week. In addition, most adults need muscle-strengthening exercises on 2 or more days a week.   Maintain a healthy weight. The body mass index (BMI) is a screening tool to identify possible weight problems. It provides an estimate of body fat based on height and weight. Your health care provider  can find your BMI and can help you achieve or maintain a healthy weight. For males 20 years and older:  A BMI below 18.5 is considered underweight.  A BMI of 18.5 to 24.9 is normal.  A BMI of 25 to 29.9 is considered overweight.  A BMI of 30 and above is considered obese.  Maintain normal blood lipids and cholesterol by exercising and minimizing your intake of saturated fat. Eat a balanced diet with plenty of fruits and vegetables. Blood tests for lipids and cholesterol should begin at age 14 and be repeated every 5 years. If your lipid or cholesterol levels are high, you are over age 21, or you are at high risk for heart disease, you may need your cholesterol levels checked more frequently.Ongoing high lipid and cholesterol levels should be treated with medicines if diet and exercise are not working.  If you smoke, find out from your health care provider how to quit. If you do not use tobacco, do not start.  Lung cancer screening is recommended for adults aged 64-80 years who are at high risk for developing lung cancer because of a history of smoking. A yearly low-dose CT scan of the lungs is recommended for people who have at least a 30-pack-year history of smoking and are current smokers or have quit  within the past 15 years. A pack year of smoking is smoking an average of 1 pack of cigarettes a day for 1 year (for example, a 30-pack-year history of smoking could mean smoking 1 pack a day for 30 years or 2 packs a day for 15 years). Yearly screening should continue until the smoker has stopped smoking for at least 15 years. Yearly screening should be stopped for people who develop a health problem that would prevent them from having lung cancer treatment.  If you choose to drink alcohol, do not have more than 2 drinks per day. One drink is considered to be 12 oz (360 mL) of beer, 5 oz (150 mL) of wine, or 1.5 oz (45 mL) of liquor.  Avoid the use of street drugs. Do not share needles with anyone. Ask for help if you need support or instructions about stopping the use of drugs.  High blood pressure causes heart disease and increases the risk of stroke. High blood pressure is more likely to develop in:  People who have blood pressure in the end of the normal range (100-139/85-89 mm Hg).  People who are overweight or obese.  People who are African American.  If you are 74-19 years of age, have your blood pressure checked every 3-5 years. If you are 20 years of age or older, have your blood pressure checked every year. You should have your blood pressure measured twice--once when you are at a hospital or clinic, and once when you are not at a hospital or clinic. Record the average of the two measurements. To check your blood pressure when you are not at a hospital or clinic, you can use:  An automated blood pressure machine at a pharmacy.  A home blood pressure monitor.  If you are 26-63 years old, ask your health care provider if you should take aspirin to prevent heart disease.  Diabetes screening involves taking a blood sample to check your fasting blood sugar level. This should be done once every 3 years after age 55 if you are at a normal weight and without risk factors for diabetes.  Testing should be considered at a younger age or be  carried out more frequently if you are overweight and have at least 1 risk factor for diabetes.  Colorectal cancer can be detected and often prevented. Most routine colorectal cancer screening begins at the age of 36 and continues through age 91. However, your health care provider may recommend screening at an earlier age if you have risk factors for colon cancer. On a yearly basis, your health care provider may provide home test kits to check for hidden blood in the stool. A small camera at the end of a tube may be used to directly examine the colon (sigmoidoscopy or colonoscopy) to detect the earliest forms of colorectal cancer. Talk to your health care provider about this at age 49 when routine screening begins. A direct exam of the colon should be repeated every 5-10 years through age 10, unless early forms of precancerous polyps or small growths are found.  People who are at an increased risk for hepatitis B should be screened for this virus. You are considered at high risk for hepatitis B if:  You were born in a country where hepatitis B occurs often. Talk with your health care provider about which countries are considered high risk.  Your parents were born in a high-risk country and you have not received a shot to protect against hepatitis B (hepatitis B vaccine).  You have HIV or AIDS.  You use needles to inject street drugs.  You live with, or have sex with, someone who has hepatitis B.  You are a man who has sex with other men (MSM).  You get hemodialysis treatment.  You take certain medicines for conditions like cancer, organ transplantation, and autoimmune conditions.  Hepatitis C blood testing is recommended for all people born from [birth year] through 1963 and any individual with known risk factors for hepatitis C.  Healthy men should no longer receive prostate-specific antigen (PSA) blood tests as part of routine cancer screening.  Talk to your health care provider about prostate cancer screening.  Testicular cancer screening is not recommended for adolescents or adult males who have no symptoms. Screening includes self-exam, a health care provider exam, and other screening tests. Consult with your health care provider about any symptoms you have or any concerns you have about testicular cancer.  Practice safe sex. Use condoms and avoid high-risk sexual practices to reduce the spread of sexually transmitted infections (STIs).  You should be screened for STIs, including gonorrhea and chlamydia if:  You are sexually active and are younger than 24 years.  You are older than 24 years, and your health care provider tells you that you are at risk for this type of infection.  Your sexual activity has changed since you were last screened, and you are at an increased risk for chlamydia or gonorrhea. Ask your health care provider if you are at risk.  If you are at risk of being infected with HIV, it is recommended that you take a prescription medicine daily to prevent HIV infection. This is called pre-exposure prophylaxis (PrEP). You are considered at risk if:  You are a man who has sex with other men (MSM).  You are a heterosexual man who is sexually active with multiple partners.  You take drugs by injection.  You are sexually active with a partner who has HIV.  Talk with your health care provider about whether you are at high risk of being infected with HIV. If you choose to begin PrEP, you should first be tested for HIV. You should then  be tested every 3 months for as long as you are taking PrEP.  Use sunscreen. Apply sunscreen liberally and repeatedly throughout the day. You should seek shade when your shadow is shorter than you. Protect yourself by wearing long sleeves, pants, a wide-brimmed hat, and sunglasses year round whenever you are outdoors.  Tell your health care provider of new moles or changes in moles,  especially if there is a change in shape or color. Also, tell your health care provider if a mole is larger than the size of a pencil eraser.  A one-time screening for abdominal aortic aneurysm (AAA) and surgical repair of large AAAs by ultrasound is recommended for men aged 54-75 years who are current or former smokers.  Stay current with your vaccines (immunizations).   This information is not intended to replace advice given to you by your health care provider. Make sure you discuss any questions you have with your health care provider.   Document Released: 01/31/2008 Document Revised: 08/25/2014 Document Reviewed: 12/30/2010 Elsevier Interactive Patient Education [employer].

## 2016-03-26 ENCOUNTER — Telehealth: Payer: Self-pay

## 2016-03-26 LAB — BASIC METABOLIC PANEL WITH GFR
BUN: 11 mg/dL (ref 7–25)
CALCIUM: 9.4 mg/dL (ref 8.6–10.3)
CO2: 24 mmol/L (ref 20–31)
CREATININE: 0.77 mg/dL (ref 0.70–1.33)
Chloride: 105 mmol/L (ref 98–110)
GFR, Est African American: >89 mL/min (ref >=60)
GFR, Est Non African American: >89 mL/min (ref >=60)
Glucose, Bld: 154 mg/dL — ABNORMAL HIGH (ref 65–99)
Potassium: 4.7 mmol/L (ref 3.5–5.3)
SODIUM: 141 mmol/L (ref 135–146)

## 2016-03-26 LAB — MICROALBUMIN / CREATININE URINE RATIO
CREATININE, URINE: 36 mg/dL (ref 20–370)
MICROALB UR: 0.5 mg/dL
MICROALB/CREAT RATIO: 14 ug/mg{creat} (ref <30)

## 2016-03-26 LAB — HIV ANTIBODY (ROUTINE TESTING W REFLEX): HIV: NONREACTIVE

## 2016-03-26 NOTE — Telephone Encounter (Signed)
Contacted pt to go over lab results pt is aware of results and doesn't have questions or concerns

## 2016-03-27 ENCOUNTER — Telehealth: Payer: Self-pay | Admitting: Internal Medicine

## 2016-03-27 NOTE — Telephone Encounter (Signed)
Contacted pt and made aware that GI referral has already been put in.

## 2016-04-23 ENCOUNTER — Ambulatory Visit (INDEPENDENT_AMBULATORY_CARE_PROVIDER_SITE_OTHER): Payer: Medicare Other | Admitting: Podiatry

## 2016-04-23 ENCOUNTER — Other Ambulatory Visit: Payer: Self-pay

## 2016-04-23 ENCOUNTER — Encounter: Payer: Self-pay | Admitting: Podiatry

## 2016-04-23 VITALS — BP 151/89 | HR 65 | Temp 97.3°F | Resp 18

## 2016-04-23 DIAGNOSIS — E1142 Type 2 diabetes mellitus with diabetic polyneuropathy: Secondary | ICD-10-CM

## 2016-04-23 DIAGNOSIS — B351 Tinea unguium: Secondary | ICD-10-CM | POA: Diagnosis not present

## 2016-04-23 DIAGNOSIS — R0989 Other specified symptoms and signs involving the circulatory and respiratory systems: Secondary | ICD-10-CM

## 2016-04-23 DIAGNOSIS — M79676 Pain in unspecified toe(s): Secondary | ICD-10-CM

## 2016-04-23 NOTE — Progress Notes (Signed)
   Subjective:    Patient ID: Derek Lowery, male    DOB: [birth year], 60 y.o.   MRN: [id number]  HPI   This patient presents today complaining that his toenails are thick and discolored and are comfortable walking wearing shoes and requests toenail debridement. The symptoms have gradually been worsening over a long period of time he denies any recent podiatric care. He also relates some numbness and drying of the skin.  Patient is disabled secondary to diabetic complications including visual disturbance Patient denies history of ulceration, claudication or amputation    Review of Systems  Constitutional: Positive for fatigue.  HENT:       Glaucoma and dizziness  Cardiovascular:       Calf pain and history of heart attack and blood pressure  Musculoskeletal:       Muscle pain and swelling   Neurological:       Paralysis   Hematological:       Slow to heal       Objective:   Physical Exam  Orientated 3  No calf edema or calf tenderness bilaterally Peripheral pitting edema ankles/feet bilaterally DP pulse 1/4 bilaterally PT pulses 0/4 bilaterally Capillary reflex immediate bilaterally  Neurological: Sensation to 10 g monofilament wire intact 4/5 right 1/5 left Vibratory sensation nonreactive bilaterally Ankle reflexes reactive bilaterally  Dermatological: No open skin lesions bilaterally Crusting dorsal MPJ, hygiene issues The toenails are elongated, discolored and tender direct palpation 6-10  Musculoskeletal: Dorsi flexion, plantar flexion 5/5 bilaterally No restriction ankle, subtalar, midtarsal joints bilaterally        Assessment & Plan:   Assessment: Diabetic with absent pedal pulses rule out PID Diabetic peripheral neuropathy Mycotic toenails 6-10  Plan: I reviewed the results of exam with patient today. Patient is referred to the vascular lab for lower extremity arterial Doppler for the indication diabetic with absent pedal pulses. Notify  patient results of arterial Doppler exam  The toenails 6-10 were debrided mechanically an elective without a bleeding  Reappoint 3 months for nail debridement and notify patient upon receipt of vascular Doppler

## 2016-04-23 NOTE — Patient Instructions (Signed)
Today her diabetic foot screen demonstrated decreased pulsations in your feet and decreased feeling. Will order a circulation test to evaluate the vascular status and notify the results Return every 3 months for debridement of toenails  Diabetes and Foot Care Diabetes may cause you to have problems because of poor blood supply (circulation) to your feet and legs. This may cause the skin on your feet to become thinner, break easier, and heal more slowly. Your skin may become dry, and the skin may peel and crack. You may also have nerve damage in your legs and feet causing decreased feeling in them. You may not notice minor injuries to your feet that could lead to infections or more serious problems. Taking care of your feet is one of the most important things you can do for yourself.  HOME CARE INSTRUCTIONS  Wear shoes at all times, even in the house. Do not go barefoot. Bare feet are easily injured.  Check your feet daily for blisters, cuts, and redness. If you cannot see the bottom of your feet, use a mirror or ask someone for help.  Wash your feet with warm water (do not use hot water) and mild soap. Then pat your feet and the areas between your toes until they are completely dry. Do not soak your feet as this can dry your skin.  Apply a moisturizing lotion or petroleum jelly (that does not contain alcohol and is unscented) to the skin on your feet and to dry, brittle toenails. Do not apply lotion between your toes.  Trim your toenails straight across. Do not dig under them or around the cuticle. File the edges of your nails with an emery board or nail file.  Do not cut corns or calluses or try to remove them with medicine.  Wear clean socks or stockings every day. Make sure they are not too tight. Do not wear knee-high stockings since they may decrease blood flow to your legs.  Wear shoes that fit properly and have enough cushioning. To break in new shoes, wear them for just a few hours a  day. This prevents you from injuring your feet. Always look in your shoes before you put them on to be sure there are no objects inside.  Do not cross your legs. This may decrease the blood flow to your feet.  If you find a minor scrape, cut, or break in the skin on your feet, keep it and the skin around it clean and dry. These areas may be cleansed with mild soap and water. Do not cleanse the area with peroxide, alcohol, or iodine.  When you remove an adhesive bandage, be sure not to damage the skin around it.  If you have a wound, look at it several times a day to make sure it is healing.  Do not use heating pads or hot water bottles. They may burn your skin. If you have lost feeling in your feet or legs, you may not know it is happening until it is too late.  Make sure your health care provider performs a complete foot exam at least annually or more often if you have foot problems. Report any cuts, sores, or bruises to your health care provider immediately. SEEK MEDICAL CARE IF:   You have an injury that is not healing.  You have cuts or breaks in the skin.  You have an ingrown nail.  You notice redness on your legs or feet.  You feel burning or tingling in your legs  or feet.  You have pain or cramps in your legs and feet.  Your legs or feet are numb.  Your feet always feel cold. SEEK IMMEDIATE MEDICAL CARE IF:   There is increasing redness, swelling, or pain in or around a wound.  There is a red line that goes up your leg.  Pus is coming from a wound.  You develop a fever or as directed by your health care provider.  You notice a bad smell coming from an ulcer or wound.   This information is not intended to replace advice given to you by your health care provider. Make sure you discuss any questions you have with your health care provider.   Document Released: 08/01/2000 Document Revised: 04/06/2013 Document Reviewed: 01/11/2013 Elsevier Interactive Patient Education  [employer].

## 2016-04-24 DIAGNOSIS — E113591 Type 2 diabetes mellitus with proliferative diabetic retinopathy without macular edema, right eye: Secondary | ICD-10-CM | POA: Diagnosis not present

## 2016-04-24 DIAGNOSIS — H5442 Blindness, left eye, normal vision right eye: Secondary | ICD-10-CM | POA: Diagnosis not present

## 2016-04-24 DIAGNOSIS — H4050X3 Glaucoma secondary to other eye disorders, unspecified eye, severe stage: Secondary | ICD-10-CM | POA: Diagnosis not present

## 2016-04-24 DIAGNOSIS — E113599 Type 2 diabetes mellitus with proliferative diabetic retinopathy without macular edema, unspecified eye: Secondary | ICD-10-CM | POA: Diagnosis not present

## 2016-06-02 DIAGNOSIS — H4050X2 Glaucoma secondary to other eye disorders, unspecified eye, moderate stage: Secondary | ICD-10-CM | POA: Diagnosis not present

## 2016-06-02 DIAGNOSIS — H2511 Age-related nuclear cataract, right eye: Secondary | ICD-10-CM | POA: Diagnosis not present

## 2016-06-02 DIAGNOSIS — E113593 Type 2 diabetes mellitus with proliferative diabetic retinopathy without macular edema, bilateral: Secondary | ICD-10-CM | POA: Diagnosis not present

## 2016-06-16 DIAGNOSIS — H2511 Age-related nuclear cataract, right eye: Secondary | ICD-10-CM | POA: Diagnosis not present

## 2016-06-19 DIAGNOSIS — H268 Other specified cataract: Secondary | ICD-10-CM | POA: Diagnosis not present

## 2016-06-19 DIAGNOSIS — H2511 Age-related nuclear cataract, right eye: Secondary | ICD-10-CM | POA: Diagnosis not present

## 2016-06-19 DIAGNOSIS — H5703 Miosis: Secondary | ICD-10-CM | POA: Diagnosis not present

## 2016-07-21 ENCOUNTER — Ambulatory Visit: Payer: Medicare Other | Attending: Internal Medicine | Admitting: Internal Medicine

## 2016-07-21 ENCOUNTER — Encounter: Payer: Self-pay | Admitting: Internal Medicine

## 2016-07-21 VITALS — BP 124/78 | HR 66 | Temp 98.3°F | Resp 16 | Wt 206.6 lb

## 2016-07-21 DIAGNOSIS — K648 Other hemorrhoids: Secondary | ICD-10-CM | POA: Insufficient documentation

## 2016-07-21 DIAGNOSIS — J069 Acute upper respiratory infection, unspecified: Secondary | ICD-10-CM | POA: Diagnosis not present

## 2016-07-21 DIAGNOSIS — E11319 Type 2 diabetes mellitus with unspecified diabetic retinopathy without macular edema: Secondary | ICD-10-CM | POA: Diagnosis not present

## 2016-07-21 DIAGNOSIS — I2510 Atherosclerotic heart disease of native coronary artery without angina pectoris: Secondary | ICD-10-CM | POA: Insufficient documentation

## 2016-07-21 DIAGNOSIS — I10 Essential (primary) hypertension: Secondary | ICD-10-CM | POA: Diagnosis not present

## 2016-07-21 DIAGNOSIS — I252 Old myocardial infarction: Secondary | ICD-10-CM | POA: Insufficient documentation

## 2016-07-21 DIAGNOSIS — Z9114 Patient's other noncompliance with medication regimen: Secondary | ICD-10-CM | POA: Insufficient documentation

## 2016-07-21 DIAGNOSIS — E13319 Other specified diabetes mellitus with unspecified diabetic retinopathy without macular edema: Secondary | ICD-10-CM | POA: Diagnosis not present

## 2016-07-21 DIAGNOSIS — E7800 Pure hypercholesterolemia, unspecified: Secondary | ICD-10-CM | POA: Diagnosis not present

## 2016-07-21 DIAGNOSIS — Z7982 Long term (current) use of aspirin: Secondary | ICD-10-CM | POA: Diagnosis not present

## 2016-07-21 DIAGNOSIS — E1142 Type 2 diabetes mellitus with diabetic polyneuropathy: Secondary | ICD-10-CM | POA: Insufficient documentation

## 2016-07-21 DIAGNOSIS — H409 Unspecified glaucoma: Secondary | ICD-10-CM | POA: Diagnosis not present

## 2016-07-21 DIAGNOSIS — Z794 Long term (current) use of insulin: Secondary | ICD-10-CM | POA: Diagnosis not present

## 2016-07-21 DIAGNOSIS — R600 Localized edema: Secondary | ICD-10-CM | POA: Diagnosis not present

## 2016-07-21 DIAGNOSIS — E1165 Type 2 diabetes mellitus with hyperglycemia: Secondary | ICD-10-CM

## 2016-07-21 LAB — BASIC METABOLIC PANEL WITH GFR
BUN: 14 mg/dL (ref 7–25)
CALCIUM: 9.6 mg/dL (ref 8.6–10.3)
CO2: 24 mmol/L (ref 20–31)
Chloride: 103 mmol/L (ref 98–110)
Creat: 0.82 mg/dL (ref 0.70–1.33)
GFR, Est African American: >89 mL/min (ref >=60)
Glucose, Bld: 200 mg/dL — ABNORMAL HIGH (ref 65–99)
Potassium: 5.5 mmol/L — ABNORMAL HIGH (ref 3.5–5.3)
SODIUM: 139 mmol/L (ref 135–146)

## 2016-07-21 LAB — GLUCOSE, POCT (MANUAL RESULT ENTRY): POC Glucose: 206 mg/dl — AB (ref 70–99)

## 2016-07-21 LAB — POCT GLYCOSYLATED HEMOGLOBIN (HGB A1C): Hemoglobin A1C: 7.8

## 2016-07-21 MED ORDER — INSULIN GLARGINE 100 UNIT/ML SOLOSTAR PEN: PEN_INJECTOR | SUBCUTANEOUS | 3 refills | Status: DC

## 2016-07-21 NOTE — Progress Notes (Signed)
F/u diabetes

## 2016-07-21 NOTE — Progress Notes (Signed)
Derek Lowery, is a 60 y.o. male  XTK:[id number]  DJM:[id number]  DOB - 15-Jan-1956  Chief Complaint  Patient presents with  . Diabetes        Subjective:   Derek Lowery is a 60 y.o. male here today for a follow up visit for htn and dm. Pt last saw optho November for right cataracts surgery, has appt w/ Dr [name] optho this December for laser trx of right dm retinopathy as well, per pt.  He has been doing well, but denies checking his cbg for last month. Had cake and donuts last night, per pt had not eaten this for a while.  He is amendable to switching lantus to nighttime injection and doing self-titration of lantus q3d.  Is taking all his other meds as prescribed.  Per pt, has not heard from gi re: colonoscopy.  Patient has No headache, No chest pain, No abdominal pain - No Nausea, No new weakness tingling or numbness, No Cough - SOB.  Per pt, nasal congestion, "cold sx" last 2 days, afebrile, but felt chilled.  No problems updated.  ALLERGIES: No Known Allergies  PAST MEDICAL HISTORY: Past Medical History:  Diagnosis Date  . CAD (coronary artery disease)    NSTEMI 06/2011:  LHC 07/18/11: Proximal diagonal 60%, distal LAD with a diabetic appearance and 60% stenosis, OM2 with an occluded superior branch and an inferior branch with 90%, EF 55% with inferior hypokinesis.  PCI: Promus DES to the OM2 inferior branch.  This vessel provides collaterals to the superior branch which remained occluded.  Echocardiogram 07/18/11: EF 60%, normal wall motion.  . Essential hypertension, benign   . Glaucoma   . Hypercholesteremia   . Internal hemorrhoids   . Noncompliance   . Type 2 diabetes mellitus ([place]) 1990    MEDICATIONS AT HOME: Prior to Admission medications   Medication Sig Start Date End Date Taking? Authorizing Provider  aspirin EC 81 MG tablet Take 1 tablet (81 mg total) by mouth daily. 03/25/16   Maren Reamer, MD  atorvastatin (LIPITOR) 40 MG tablet TAKE 1 TABLET  BY MOUTH EVERY DAY FOR CHOLESTEROL 03/25/16   Maren Reamer, MD  BD PEN NEEDLE NANO U/F 32G X 4 MM MISC USE AS DIRECTED 12/31/15   Tresa Garter, MD  Capsaicin-Menthol-Methyl Sal (CAPSAICIN-METHYL SAL-MENTHOL) 0.025-1-12 % CREA Apply 1 application topically 4 (four) times daily as needed. For aches/pains, apply to skin. May need gloves. Patient not taking: Reported on 03/25/2016 02/11/16   Maren Reamer, MD  Cinnamon 500 MG capsule Take 500 mg by mouth daily. Reported on 02/06/2016    Historical Provider, MD  dorzolamide-timolol (COSOPT) 22.3-6.8 MG/ML ophthalmic solution Place 1 drop into both eyes 2 (two) times daily.    Historical Provider, MD  glucose blood (TRUETEST TEST) test strip Check sugar 3 times per day for E11.65 03/26/15   Lance Bosch, NP  glucose monitoring kit (FREESTYLE) monitoring kit 1 each by Does not apply route as needed for other. 05/10/14   Modena Jansky, MD  Insulin Glargine (LANTUS SOLOSTAR) 100 UNIT/ML Solostar Pen Inject 19 units every night before bedtime. (around 10pm) 07/21/16   Maren Reamer, MD  insulin lispro (HUMALOG KWIKPEN) 100 UNIT/ML KiwkPen Inject 0.02 mLs (2 Units total) into the skin 2 (two) times daily with a meal. Breakfast and supper, and pen needles 3/day 03/25/16   Maren Reamer, MD  Insulin Syringe-Needle U-100 (SAFETY-GLIDE 0.3CC SYR 29GX1/2) 29G X 1/2" 0.3 ML MISC Use  as directed. 11/09/14   Lance Bosch, NP  lisinopril (PRINIVIL,ZESTRIL) 10 MG tablet Take 1 tablet (10 mg total) by mouth daily. for blood pressure 03/25/16   Maren Reamer, MD  Multiple Vitamins-Minerals (MULTIVITAMIN WITH MINERALS) tablet Take 1 tablet by mouth daily.    Historical Provider, MD  ondansetron (ZOFRAN ODT) 4 MG disintegrating tablet Take 1 tablet (4 mg total) by mouth every 8 (eight) hours as needed for nausea or vomiting. 10/05/15   Carmin Muskrat, MD  vitamin C (VITAMIN C) 500 MG tablet Take 1 tablet (500 mg total) by mouth 2 (two) times daily. 05/10/14    Modena Jansky, MD     Objective:   Vitals:   07/21/16 0927  BP: 124/78  Pulse: 66  Resp: 16  Temp: 98.3 F (36.8 C)  TempSrc: Oral  SpO2: 100%  Weight: 206 lb 9.6 oz (93.7 kg)    Exam General appearance : Awake, alert, not in any distress. Speech Clear. Not toxic looking, pleasant. Appears older than stated age. HEENT: Atraumatic and Normocephalic, pupils equally reactive to light.  bilat TMs clear, w/ scaling skin around Ear canals. Neck: supple, no JVD. No cervical lymphadenopathy.  Chest:Good air entry bilaterally, no added sounds. CVS: S1 S2 regular, no murmurs/gallups or rubs. Abdomen: Bowel sounds active, obese, Non tender and not distended with no gaurding, rigidity or rebound. Foot exam: bilateral peripheral pulses 2+ (dorsalis pedis and post tibialis pulses), no ulcers noted/no ecchymosis, warm to touch, monofilament testing 1/3 bilat. Sensation intact.  No c/c.  1+ edema bilat. Neurology: Awake alert, and oriented X 3, CN II-XII grossly intact, Non focal Skin:No Rash  Data Review Lab Results  Component Value Date   HGBA1C 7.4 03/25/2016   HGBA1C 7.9 11/23/2015   HGBA1C 10.50 03/26/2015    Depression screen PHQ 2/9 07/21/2016 03/25/2016 02/06/2016 11/23/2015 05/19/2014  Decreased Interest 3 3 0 0 0  Down, Depressed, Hopeless 1 0 0 0 0  PHQ - 2 Score 4 3 0 0 0  Altered sleeping 1 1 - - -  Tired, decreased energy 3 1 - - -  Change in appetite 2 1 - - -  Feeling bad or failure about yourself  0 0 - - -  Trouble concentrating 0 0 - - -  Moving slowly or fidgety/restless 2 0 - - -  Suicidal thoughts 0 0 - - -  PHQ-9 Score 12 6 - - -      Assessment & Plan   1. Poorly controlled type 2 diabetes mellitus ([place]), uncontrolled, w/ dm retionopathy  due to dietary indescretions.  dw pt diet recds, avoid carbs, etc. - instructed pt on self adjustments of lantus, amendable to chking cbg daily, and titrating lantus (switched to nighttime injections) q3 d if cbg > 150.  Pt able to repeat instructions and adjustment recds. - POCT glucose (manual entry) 206 - POCT glycosylated hemoglobin (Hb A1C) 7.8 - BASIC METABOLIC PANEL WITH GFR - fu [facility] 2 wks.  2. Pedal edema, suspect dependent edema - ted hose rx, wear during day, take off at night - elevate legs at night,  - chk bmp  3. Retinopathy due to secondary diabetes ([place]) - has fu w/ Dr [name] optho for laser trx of right eye dm retinopathy this month per Pt.  4. Diabetic polyneuropathy associated with type 2 diabetes mellitus ([facility]) Mild, pt stopped neurontin since was not doing much help.  5. Has dm pod f/u this month as well.  6. Samuel [place], mild, pt wants to hold off on flu vac til next week - rn appt for flu vac next week.  7. Colonoscopy screening - asked referral specialist to f/u on status of this gi referral.     Patient have been counseled extensively about nutrition and exercise  Return in about 3 months (around 10/19/2016).  The patient was given clear instructions to go to ER or return to medical center if symptoms don't improve, worsen or new problems develop. The patient verbalized understanding. The patient was told to call to get lab results if they haven't heard anything in the next week.   This note has been created with Surveyor, quantity. Any transcriptional errors are unintentional.   Maren Reamer, MD, [place] [facility] [place], [place]   07/21/2016, 9:55 AM

## 2016-07-21 NOTE — Patient Instructions (Addendum)
- rn appt for FLU shot next week or w/ pharmacy  - [facility] /dm chk.   Check blood sugars on waking up daily., and at least 1-2 times rest of day.    Also check blood sugars about 2 hours after a meal and do this after different meals by rotation  Recommended blood sugar levels on waking up is 90-130 and about 2 hours after meal is 130-160  Please bring your blood sugar monitor to each visit, thank you  Restart exercise  Continue cholesterol med.   - Lantus Titration  Instructions:  IF AM blood sugar is  >150, increase lantus by 1 unit at night.  Continue to check sugars daily. On day 3, if blood sugar >150, continue to increase lantus by 1 unit.  If blood sugar < 70s, than need to reduce dose by 1 unit nightly.  Continue to check blood sugars in am daily and adjust per above.  -- please call us if you have any questions   -   Edema Edema is an abnormal buildup of fluids. It is more common in your legs and thighs. Painless swelling of the feet and ankles is more likely as a person ages. It also is common in looser skin, like around your eyes. Follow these instructions at home:  Keep the affected body part above the level of the heart while lying down.  Do not sit still or stand for a long time.  Do not put anything right under your knees when you lie down.  Do not wear tight clothes on your upper legs.  Exercise your legs to help the puffiness (swelling) go down.  Wear elastic bandages or support stockings as told by your doctor.  A low-salt diet may help lessen the puffiness.  Only take medicine as told by your doctor. Contact a doctor if:  Treatment is not working.  You have heart, liver, or kidney disease and notice that your skin looks puffy or shiny.  You have puffiness in your legs that does not get better when you raise your legs.  You have sudden weight gain for no reason. Get help right away if:  You have shortness of breath  or chest pain.  You cannot breathe when you lie down.  You have pain, redness, or warmth in the areas that are puffy.  You have heart, liver, or kidney disease and get edema all of a sudden.  You have a fever and your symptoms get worse all of a sudden. This information is not intended to replace advice given to you by your health care provider. Make sure you discuss any questions you have with your health care provider. Document Released: 01/21/2008 Document Revised: 01/10/2016 Document Reviewed: 05/27/2013 Elsevier Interactive Patient Education  2017 [employer]   -  Diabetes Mellitus and Food It is important for you to manage your blood sugar (glucose) level. Your blood glucose level can be greatly affected by what you eat. Eating healthier foods in the appropriate amounts throughout the day at about the same time each day will help you control your blood glucose level. It can also help slow or prevent worsening of your diabetes mellitus. Healthy eating may even help you improve the level of your blood pressure and reach or maintain a healthy weight. General recommendations for healthful eating and cooking habits include:  Eating meals and snacks regularly. Avoid going long periods of time without eating to lose weight.  Eating a diet  that consists mainly of plant-based foods, such as fruits, vegetables, nuts, legumes, and whole grains.  Using low-heat cooking methods, such as baking, instead of high-heat cooking methods, such as deep frying. Work with your dietitian to make sure you understand how to use the Nutrition Facts information on food labels. How can food affect me? Carbohydrates  Carbohydrates affect your blood glucose level more than any other type of food. Your dietitian will help you determine how many carbohydrates to eat at each meal and teach you how to count carbohydrates. Counting carbohydrates is important to keep your blood glucose at a healthy level, especially  if you are using insulin or taking certain medicines for diabetes mellitus. Alcohol  Alcohol can cause sudden decreases in blood glucose (hypoglycemia), especially if you use insulin or take certain medicines for diabetes mellitus. Hypoglycemia can be a life-threatening condition. Symptoms of hypoglycemia (sleepiness, dizziness, and disorientation) are similar to symptoms of having too much alcohol. If your health care provider has given you approval to drink alcohol, do so in moderation and use the following guidelines:  Women should not have more than one drink per day, and men should not have more than two drinks per day. One drink is equal to:  12 oz of beer.  5 oz of wine.  1 oz of hard liquor.  Do not drink on an empty stomach.  Keep yourself hydrated. Have water, diet soda, or unsweetened iced tea.  Regular soda, juice, and other mixers might contain a lot of carbohydrates and should be counted. What foods are not recommended? As you make food choices, it is important to remember that all foods are not the same. Some foods have fewer nutrients per serving than other foods, even though they might have the same number of calories or carbohydrates. It is difficult to get your body what it needs when you eat foods with fewer nutrients. Examples of foods that you should avoid that are high in calories and carbohydrates but low in nutrients include:  Trans fats (most processed foods list trans fats on the Nutrition Facts label).  Regular soda.  Juice.  Candy.  Sweets, such as cake, pie, doughnuts, and cookies.  Fried foods. What foods can I eat? Eat nutrient-rich foods, which will nourish your body and keep you healthy. The food you should eat also will depend on several factors, including:  The calories you need.  The medicines you take.  Your weight.  Your blood glucose level.  Your blood pressure level.  Your cholesterol level. You should eat a variety of foods,  including:  Protein.  Lean cuts of meat.  Proteins low in saturated fats, such as fish, egg whites, and beans. Avoid processed meats.  Fruits and vegetables.  Fruits and vegetables that may help control blood glucose levels, such as apples, mangoes, and yams.  Dairy products.  Choose fat-free or low-fat dairy products, such as milk, yogurt, and cheese.  Grains, bread, pasta, and rice.  Choose whole grain products, such as multigrain bread, whole oats, and brown rice. These foods may help control blood pressure.  Fats.  Foods containing healthful fats, such as nuts, avocado, olive oil, canola oil, and fish. Does everyone with diabetes mellitus have the same meal plan? Because every person with diabetes mellitus is different, there is not one meal plan that works for everyone. It is very important that you meet with a dietitian who will help you create a meal plan that is just right for you. This  information is not intended to replace advice given to you by your health care provider. Make sure you discuss any questions you have with your health care provider. Document Released: 05/01/2005 Document Revised: 01/10/2016 Document Reviewed: 07/01/2013 Elsevier Interactive Patient Education  2017 [employer].

## 2016-07-22 ENCOUNTER — Other Ambulatory Visit: Payer: Self-pay | Admitting: Internal Medicine

## 2016-07-22 DIAGNOSIS — E875 Hyperkalemia: Secondary | ICD-10-CM

## 2016-07-25 ENCOUNTER — Telehealth: Payer: Self-pay

## 2016-07-25 NOTE — Telephone Encounter (Signed)
Contacted pt to go over lab results pt is aware of results and pt states he has an appointment on the 19th pt states he will get his lab redrawn when he comes in then if its okay

## 2016-07-30 ENCOUNTER — Ambulatory Visit (INDEPENDENT_AMBULATORY_CARE_PROVIDER_SITE_OTHER): Payer: Medicare Other | Admitting: Podiatry

## 2016-07-30 NOTE — Progress Notes (Signed)
ERRONEOUS ENCOUNTER NO SHOW

## 2016-08-01 ENCOUNTER — Other Ambulatory Visit: Payer: Self-pay | Admitting: Internal Medicine

## 2016-08-01 DIAGNOSIS — E113599 Type 2 diabetes mellitus with proliferative diabetic retinopathy without macular edema, unspecified eye: Secondary | ICD-10-CM | POA: Diagnosis not present

## 2016-08-01 DIAGNOSIS — E1165 Type 2 diabetes mellitus with hyperglycemia: Secondary | ICD-10-CM

## 2016-08-01 DIAGNOSIS — E113591 Type 2 diabetes mellitus with proliferative diabetic retinopathy without macular edema, right eye: Secondary | ICD-10-CM | POA: Diagnosis not present

## 2016-08-01 DIAGNOSIS — E113511 Type 2 diabetes mellitus with proliferative diabetic retinopathy with macular edema, right eye: Secondary | ICD-10-CM | POA: Diagnosis not present

## 2016-08-01 DIAGNOSIS — H211X1 Other vascular disorders of iris and ciliary body, right eye: Secondary | ICD-10-CM | POA: Diagnosis not present

## 2016-08-05 ENCOUNTER — Ambulatory Visit: Payer: Medicare Other | Admitting: Pharmacist

## 2016-08-05 ENCOUNTER — Ambulatory Visit: Payer: Medicare Other

## 2016-08-07 DIAGNOSIS — E113511 Type 2 diabetes mellitus with proliferative diabetic retinopathy with macular edema, right eye: Secondary | ICD-10-CM | POA: Diagnosis not present

## 2016-08-28 DIAGNOSIS — H211X1 Other vascular disorders of iris and ciliary body, right eye: Secondary | ICD-10-CM | POA: Diagnosis not present

## 2016-08-28 DIAGNOSIS — H35371 Puckering of macula, right eye: Secondary | ICD-10-CM | POA: Diagnosis not present

## 2016-08-28 DIAGNOSIS — E113511 Type 2 diabetes mellitus with proliferative diabetic retinopathy with macular edema, right eye: Secondary | ICD-10-CM | POA: Diagnosis not present

## 2016-09-02 ENCOUNTER — Ambulatory Visit: Payer: Medicare Other | Attending: Internal Medicine

## 2016-09-02 DIAGNOSIS — E875 Hyperkalemia: Secondary | ICD-10-CM | POA: Diagnosis not present

## 2016-09-02 LAB — BASIC METABOLIC PANEL
BUN: 13 mg/dL (ref 7–25)
CO2: 24 mmol/L (ref 20–31)
CREATININE: 0.8 mg/dL (ref 0.70–1.33)
Calcium: 9.1 mg/dL (ref 8.6–10.3)
Chloride: 108 mmol/L (ref 98–110)
GLUCOSE: 254 mg/dL — AB (ref 65–99)
Potassium: 4.6 mmol/L (ref 3.5–5.3)
Sodium: 141 mmol/L (ref 135–146)

## 2016-09-02 NOTE — Progress Notes (Signed)
Patient here for lab visit only 

## 2016-09-05 ENCOUNTER — Telehealth: Payer: Self-pay

## 2016-09-05 NOTE — Telephone Encounter (Signed)
Contacted pt to go over lab results dialed the number provided wrong number will mial letter out

## 2016-10-06 ENCOUNTER — Other Ambulatory Visit: Payer: Self-pay | Admitting: Internal Medicine

## 2016-10-06 DIAGNOSIS — I10 Essential (primary) hypertension: Secondary | ICD-10-CM

## 2016-10-07 DIAGNOSIS — E113511 Type 2 diabetes mellitus with proliferative diabetic retinopathy with macular edema, right eye: Secondary | ICD-10-CM | POA: Diagnosis not present

## 2016-10-07 DIAGNOSIS — H4050X3 Glaucoma secondary to other eye disorders, unspecified eye, severe stage: Secondary | ICD-10-CM | POA: Diagnosis not present

## 2016-10-07 DIAGNOSIS — H5442A5 Blindness left eye category 5, normal vision right eye: Secondary | ICD-10-CM | POA: Diagnosis not present

## 2016-10-07 DIAGNOSIS — H35371 Puckering of macula, right eye: Secondary | ICD-10-CM | POA: Diagnosis not present

## 2016-10-07 DIAGNOSIS — H211X1 Other vascular disorders of iris and ciliary body, right eye: Secondary | ICD-10-CM | POA: Diagnosis not present

## 2016-11-18 DIAGNOSIS — E113511 Type 2 diabetes mellitus with proliferative diabetic retinopathy with macular edema, right eye: Secondary | ICD-10-CM | POA: Diagnosis not present

## 2016-11-18 DIAGNOSIS — H4050X3 Glaucoma secondary to other eye disorders, unspecified eye, severe stage: Secondary | ICD-10-CM | POA: Diagnosis not present

## 2016-11-18 DIAGNOSIS — E113599 Type 2 diabetes mellitus with proliferative diabetic retinopathy without macular edema, unspecified eye: Secondary | ICD-10-CM | POA: Diagnosis not present

## 2016-11-18 DIAGNOSIS — H211X1 Other vascular disorders of iris and ciliary body, right eye: Secondary | ICD-10-CM | POA: Diagnosis not present

## 2016-11-21 ENCOUNTER — Other Ambulatory Visit: Payer: Self-pay | Admitting: Pharmacist

## 2016-11-21 ENCOUNTER — Encounter: Payer: Self-pay | Admitting: Internal Medicine

## 2016-11-21 DIAGNOSIS — Z1211 Encounter for screening for malignant neoplasm of colon: Secondary | ICD-10-CM

## 2016-12-02 DIAGNOSIS — Z961 Presence of intraocular lens: Secondary | ICD-10-CM | POA: Diagnosis not present

## 2016-12-02 DIAGNOSIS — H4050X2 Glaucoma secondary to other eye disorders, unspecified eye, moderate stage: Secondary | ICD-10-CM | POA: Diagnosis not present

## 2016-12-02 DIAGNOSIS — H4051X2 Glaucoma secondary to other eye disorders, right eye, moderate stage: Secondary | ICD-10-CM | POA: Diagnosis not present

## 2016-12-02 DIAGNOSIS — E113593 Type 2 diabetes mellitus with proliferative diabetic retinopathy without macular edema, bilateral: Secondary | ICD-10-CM | POA: Diagnosis not present

## 2016-12-10 ENCOUNTER — Other Ambulatory Visit: Payer: Self-pay | Admitting: Pharmacist

## 2016-12-10 DIAGNOSIS — I10 Essential (primary) hypertension: Secondary | ICD-10-CM

## 2016-12-10 MED ORDER — LISINOPRIL 10 MG PO TABS: ORAL_TABLET | ORAL | 0 refills | Status: DC

## 2016-12-23 DIAGNOSIS — H35371 Puckering of macula, right eye: Secondary | ICD-10-CM | POA: Diagnosis not present

## 2016-12-23 DIAGNOSIS — E113511 Type 2 diabetes mellitus with proliferative diabetic retinopathy with macular edema, right eye: Secondary | ICD-10-CM | POA: Diagnosis not present

## 2016-12-23 DIAGNOSIS — E113599 Type 2 diabetes mellitus with proliferative diabetic retinopathy without macular edema, unspecified eye: Secondary | ICD-10-CM | POA: Diagnosis not present

## 2016-12-23 DIAGNOSIS — H211X1 Other vascular disorders of iris and ciliary body, right eye: Secondary | ICD-10-CM | POA: Diagnosis not present

## 2016-12-23 DIAGNOSIS — H4050X3 Glaucoma secondary to other eye disorders, unspecified eye, severe stage: Secondary | ICD-10-CM | POA: Diagnosis not present

## 2016-12-30 DIAGNOSIS — E113511 Type 2 diabetes mellitus with proliferative diabetic retinopathy with macular edema, right eye: Secondary | ICD-10-CM | POA: Diagnosis not present

## 2017-01-01 ENCOUNTER — Encounter: Payer: Self-pay | Admitting: Internal Medicine

## 2017-01-02 ENCOUNTER — Encounter: Payer: Self-pay | Admitting: Internal Medicine

## 2017-01-05 ENCOUNTER — Encounter: Payer: Self-pay | Admitting: Internal Medicine

## 2017-01-08 ENCOUNTER — Ambulatory Visit (AMBULATORY_SURGERY_CENTER): Payer: Self-pay

## 2017-01-08 VITALS — Ht 70.0 in | Wt 222.8 lb

## 2017-01-08 DIAGNOSIS — Z1211 Encounter for screening for malignant neoplasm of colon: Secondary | ICD-10-CM

## 2017-01-08 NOTE — Progress Notes (Signed)
Denies allergies to eggs or soy products. Denies complication of anesthesia or sedation. Denies use of weight loss medication. Denies use of O2.   Emmi instructions declined, patient doesn't know his email address.

## 2017-01-20 ENCOUNTER — Other Ambulatory Visit: Payer: Self-pay | Admitting: Internal Medicine

## 2017-01-22 ENCOUNTER — Ambulatory Visit (AMBULATORY_SURGERY_CENTER): Payer: Medicare Other | Admitting: Internal Medicine

## 2017-01-22 ENCOUNTER — Encounter: Payer: Self-pay | Admitting: Internal Medicine

## 2017-01-22 VITALS — BP 142/76 | HR 63 | Temp 98.6°F | Resp 14 | Ht 70.0 in | Wt 206.0 lb

## 2017-01-22 DIAGNOSIS — Z1211 Encounter for screening for malignant neoplasm of colon: Secondary | ICD-10-CM | POA: Diagnosis not present

## 2017-01-22 DIAGNOSIS — Z1212 Encounter for screening for malignant neoplasm of rectum: Secondary | ICD-10-CM | POA: Diagnosis not present

## 2017-01-22 MED ORDER — SODIUM CHLORIDE 0.9 % IV SOLN
500.0000 mL | INTRAVENOUS | Status: DC
Start: 2017-01-22 — End: 2018-03-23

## 2017-01-22 NOTE — Op Note (Signed)
[place] Patient Name: Derek Lowery Procedure Date: 01/22/2017 7:34 AM MRN: [id number] Endoscopist: Gatha Mayer , MD Age: 61 Referring MD:  Date of Birth: February 26, 1956 Gender: Male Account #: [id number] Procedure:                Colonoscopy Indications:              Screening for colorectal malignant neoplasm Medicines:                Propofol per Anesthesia, Monitored Anesthesia Care Procedure:                Pre-Anesthesia Assessment:                           - Prior to the procedure, a History and Physical                            was performed, and patient medications and                            allergies were reviewed. The patient's tolerance of                            previous anesthesia was also reviewed. The risks                            and benefits of the procedure and the sedation                            options and risks were discussed with the patient.                            All questions were answered, and informed consent                            was obtained. Prior Anticoagulants: The patient has                            taken no previous anticoagulant or antiplatelet                            agents. ASA Grade Assessment: III - A patient with                            severe systemic disease. After reviewing the risks                            and benefits, the patient was deemed in                            satisfactory condition to undergo the procedure.                           After obtaining informed consent, the colonoscope  was passed under direct vision. Throughout the                            procedure, the patient's blood pressure, pulse, and                            oxygen saturations were monitored continuously. The                            Colonoscope was introduced through the anus and                            advanced to the the cecum, identified by   appendiceal orifice and ileocecal valve. The                            quality of the bowel preparation was adequate. The                            colonoscopy was performed without difficulty. The                            patient tolerated the procedure well. The bowel                            preparation used was Miralax. The ileocecal valve,                            appendiceal orifice, and rectum were photographed. Scope In: 7:44:54 AM Scope Out: 8:15:47 AM Scope Withdrawal Time: 0 hours 24 minutes 13 seconds  Total Procedure Duration: 0 hours 30 minutes 53 seconds  Findings:                 The perianal and digital rectal examinations were                            normal.                           The colon (entire examined portion) appeared normal.                           No additional abnormalities were found on                            retroflexion. Complications:            No immediate complications. Estimated blood loss:                            None. Estimated Blood Loss:     Estimated blood loss: none. Recommendation:           - Repeat colonoscopy in 10 years for screening                            purposes.                           -  Patient has a contact number available for                            emergencies. The signs and symptoms of potential                            delayed complications were discussed with the                            patient. Return to normal activities tomorrow.                            Written discharge instructions were provided to the                            patient.                           - Resume previous diet.                           - Continue present medications. Gatha Mayer, MD 01/22/2017 8:22:00 AM This report has been signed electronically.

## 2017-01-22 NOTE — Patient Instructions (Addendum)
   No polyps or cancer seen.  I had to work to clean the colon some - fiber pieces in there but adequate prep.  I appreciate the opportunity to care for you. Gatha Mayer, MD, FACG   YOU HAD AN ENDOSCOPIC PROCEDURE TODAY AT [facility]:   Refer to the procedure report that was given to you for any specific questions about what was found during the examination.  If the procedure report does not answer your questions, please call your gastroenterologist to clarify.  If you requested that your care partner not be given the details of your procedure findings, then the procedure report has been included in a sealed envelope for you to review at your convenience later.  YOU SHOULD EXPECT: Some feelings of bloating in the abdomen. Passage of more gas than usual.  Walking can help get rid of the air that was put into your GI tract during the procedure and reduce the bloating. If you had a lower endoscopy (such as a colonoscopy or flexible sigmoidoscopy) you may notice spotting of blood in your stool or on the toilet paper. If you underwent a bowel prep for your procedure, you may not have a normal bowel movement for a few days.  Please Note:  You might notice some irritation and congestion in your nose or some drainage.  This is from the oxygen used during your procedure.  There is no need for concern and it should clear up in a day or so.  SYMPTOMS TO REPORT IMMEDIATELY:   Following lower endoscopy (colonoscopy or flexible sigmoidoscopy):  Excessive amounts of blood in the stool  Significant tenderness or worsening of abdominal pains  Swelling of the abdomen that is new, acute  Fever of 100F or higher   For urgent or emergent issues, a gastroenterologist can be reached at any hour by calling [phone].   DIET:  We do recommend a small meal at first, but then you may proceed to your regular diet.  Drink plenty of fluids but you should avoid alcoholic beverages for  24 hours.  ACTIVITY:  You should plan to take it easy for the rest of today and you should NOT DRIVE or use heavy machinery until tomorrow (because of the sedation medicines used during the test).    FOLLOW UP: Our staff will call the number listed on your records the next business day following your procedure to check on you and address any questions or concerns that you may have regarding the information given to you following your procedure. If we do not reach you, we will leave a message.  However, if you are feeling well and you are not experiencing any problems, there is no need to return our call.  We will assume that you have returned to your regular daily activities without incident.  If any biopsies were taken you will be contacted by phone or by letter within the next 1-3 weeks.  Please call us at [phone] if you have not heard about the biopsies in 3 weeks.    SIGNATURES/CONFIDENTIALITY: You and/or your care partner have signed paperwork which will be entered into your electronic medical record.  These signatures attest to the fact that that the information above on your After Visit Summary has been reviewed and is understood.  Full responsibility of the confidentiality of this discharge information lies with you and/or your care-partner.  Thank you for letting us take care of your healthcare needs today.

## 2017-01-22 NOTE — Progress Notes (Signed)
Dental advisory given to patient 

## 2017-01-23 ENCOUNTER — Telehealth: Payer: Self-pay

## 2017-01-23 ENCOUNTER — Telehealth: Payer: Self-pay | Admitting: *Deleted

## 2017-01-23 NOTE — Telephone Encounter (Signed)
No answer, second call.  Left message to call if questions or concerns.

## 2017-01-23 NOTE — Telephone Encounter (Signed)
Number identifier, left message.

## 2017-01-27 ENCOUNTER — Other Ambulatory Visit: Payer: Self-pay | Admitting: Pharmacist

## 2017-01-27 ENCOUNTER — Other Ambulatory Visit: Payer: Self-pay | Admitting: Internal Medicine

## 2017-01-27 DIAGNOSIS — E1165 Type 2 diabetes mellitus with hyperglycemia: Secondary | ICD-10-CM

## 2017-01-27 MED ORDER — GLUCOSE BLOOD VI STRP: ORAL_STRIP | 0 refills | Status: DC

## 2017-01-30 ENCOUNTER — Other Ambulatory Visit: Payer: Self-pay | Admitting: Internal Medicine

## 2017-02-04 ENCOUNTER — Telehealth: Payer: Self-pay | Admitting: Pharmacist

## 2017-02-04 DIAGNOSIS — H26491 Other secondary cataract, right eye: Secondary | ICD-10-CM | POA: Diagnosis not present

## 2017-02-04 DIAGNOSIS — E113511 Type 2 diabetes mellitus with proliferative diabetic retinopathy with macular edema, right eye: Secondary | ICD-10-CM | POA: Diagnosis not present

## 2017-02-04 DIAGNOSIS — H35371 Puckering of macula, right eye: Secondary | ICD-10-CM | POA: Diagnosis not present

## 2017-02-04 DIAGNOSIS — H211X1 Other vascular disorders of iris and ciliary body, right eye: Secondary | ICD-10-CM | POA: Diagnosis not present

## 2017-02-04 DIAGNOSIS — H4050X3 Glaucoma secondary to other eye disorders, unspecified eye, severe stage: Secondary | ICD-10-CM | POA: Diagnosis not present

## 2017-02-04 MED ORDER — INSULIN GLARGINE 100 UNIT/ML SOLOSTAR PEN: PEN_INJECTOR | SUBCUTANEOUS | 0 refills | Status: DC

## 2017-02-04 NOTE — Telephone Encounter (Signed)
Lantus refilled - patient must have office visit for any further refills.

## 2017-02-05 ENCOUNTER — Other Ambulatory Visit: Payer: Self-pay | Admitting: Pharmacist

## 2017-02-05 DIAGNOSIS — E1165 Type 2 diabetes mellitus with hyperglycemia: Secondary | ICD-10-CM

## 2017-02-05 MED ORDER — GLUCOSE BLOOD VI STRP: ORAL_STRIP | 0 refills | Status: DC

## 2017-03-03 ENCOUNTER — Other Ambulatory Visit: Payer: Self-pay | Admitting: Internal Medicine

## 2017-03-05 ENCOUNTER — Encounter: Payer: Self-pay | Admitting: Internal Medicine

## 2017-03-05 ENCOUNTER — Ambulatory Visit: Payer: Medicare Other | Attending: Internal Medicine | Admitting: Internal Medicine

## 2017-03-05 VITALS — BP 150/80 | HR 61 | Temp 98.0°F | Resp 16 | Wt 223.2 lb

## 2017-03-05 DIAGNOSIS — E1142 Type 2 diabetes mellitus with diabetic polyneuropathy: Secondary | ICD-10-CM

## 2017-03-05 DIAGNOSIS — Z7982 Long term (current) use of aspirin: Secondary | ICD-10-CM | POA: Insufficient documentation

## 2017-03-05 DIAGNOSIS — E11319 Type 2 diabetes mellitus with unspecified diabetic retinopathy without macular edema: Secondary | ICD-10-CM | POA: Insufficient documentation

## 2017-03-05 DIAGNOSIS — I2510 Atherosclerotic heart disease of native coronary artery without angina pectoris: Secondary | ICD-10-CM | POA: Insufficient documentation

## 2017-03-05 DIAGNOSIS — H548 Legal blindness, as defined in USA: Secondary | ICD-10-CM | POA: Diagnosis not present

## 2017-03-05 DIAGNOSIS — Z841 Family history of disorders of kidney and ureter: Secondary | ICD-10-CM | POA: Insufficient documentation

## 2017-03-05 DIAGNOSIS — E785 Hyperlipidemia, unspecified: Secondary | ICD-10-CM

## 2017-03-05 DIAGNOSIS — E118 Type 2 diabetes mellitus with unspecified complications: Secondary | ICD-10-CM

## 2017-03-05 DIAGNOSIS — Z808 Family history of malignant neoplasm of other organs or systems: Secondary | ICD-10-CM | POA: Diagnosis not present

## 2017-03-05 DIAGNOSIS — L853 Xerosis cutis: Secondary | ICD-10-CM | POA: Diagnosis not present

## 2017-03-05 DIAGNOSIS — E1165 Type 2 diabetes mellitus with hyperglycemia: Secondary | ICD-10-CM | POA: Diagnosis not present

## 2017-03-05 DIAGNOSIS — Z833 Family history of diabetes mellitus: Secondary | ICD-10-CM | POA: Insufficient documentation

## 2017-03-05 DIAGNOSIS — E119 Type 2 diabetes mellitus without complications: Secondary | ICD-10-CM | POA: Diagnosis present

## 2017-03-05 DIAGNOSIS — M7989 Other specified soft tissue disorders: Secondary | ICD-10-CM | POA: Diagnosis not present

## 2017-03-05 DIAGNOSIS — Z79899 Other long term (current) drug therapy: Secondary | ICD-10-CM | POA: Insufficient documentation

## 2017-03-05 DIAGNOSIS — H5460 Unqualified visual loss, one eye, unspecified: Secondary | ICD-10-CM | POA: Diagnosis not present

## 2017-03-05 DIAGNOSIS — Z8249 Family history of ischemic heart disease and other diseases of the circulatory system: Secondary | ICD-10-CM | POA: Diagnosis not present

## 2017-03-05 DIAGNOSIS — I10 Essential (primary) hypertension: Secondary | ICD-10-CM | POA: Diagnosis not present

## 2017-03-05 DIAGNOSIS — Z800 Family history of malignant neoplasm of digestive organs: Secondary | ICD-10-CM | POA: Insufficient documentation

## 2017-03-05 DIAGNOSIS — H409 Unspecified glaucoma: Secondary | ICD-10-CM | POA: Diagnosis not present

## 2017-03-05 DIAGNOSIS — IMO0002 Reserved for concepts with insufficient information to code with codable children: Secondary | ICD-10-CM

## 2017-03-05 DIAGNOSIS — Z794 Long term (current) use of insulin: Secondary | ICD-10-CM | POA: Diagnosis not present

## 2017-03-05 LAB — GLUCOSE, POCT (MANUAL RESULT ENTRY): POC Glucose: 237 mg/dl — AB (ref 70–99)

## 2017-03-05 LAB — POCT GLYCOSYLATED HEMOGLOBIN (HGB A1C): Hemoglobin A1C: 9.2

## 2017-03-05 MED ORDER — GLUCOSE BLOOD VI STRP: ORAL_STRIP | 0 refills | Status: DC

## 2017-03-05 MED ORDER — INSULIN LISPRO 100 UNIT/ML (KWIKPEN): 3.0000 [IU] | PEN_INJECTOR | Freq: Two times a day (BID) | SUBCUTANEOUS | 11 refills | Status: DC

## 2017-03-05 MED ORDER — LISINOPRIL-HYDROCHLOROTHIAZIDE 10-12.5 MG PO TABS: 1.0000 | ORAL_TABLET | Freq: Every day | ORAL | 3 refills | Status: DC

## 2017-03-05 MED ORDER — INSULIN PEN NEEDLE 32G X 4 MM MISC: 5 refills | Status: DC

## 2017-03-05 MED ORDER — ATORVASTATIN CALCIUM 40 MG PO TABS: ORAL_TABLET | ORAL | 3 refills | Status: DC

## 2017-03-05 MED ORDER — INSULIN GLARGINE 100 UNIT/ML SOLOSTAR PEN: PEN_INJECTOR | SUBCUTANEOUS | 0 refills | Status: DC

## 2017-03-05 NOTE — Progress Notes (Signed)
Patient ID: Derek Lowery, male    DOB: 05/02/1956  MRN: [id number]  CC: re-establish; Diabetes; and Hypertension   Subjective: Derek Lowery is a 61 y.o. male who presents for chronic ds management. Last saw Dr. Janne Napoleon 07/2016. His concerns today include:  61 yr old male with hx of DM type 2 with retinopathy(laser treatments by Dr. [name]) and peripheral neuropathy, HTN, CAD with stent OM2 in 2012, glaucoma (blind LT eye) and HL.   1. Glaucoma: has drain in RT eye.  Saw Dr. [name] recently  2. Colonoscopy done last mth ok. No polyps  3. Saw dentist 2 wks ago and had 6 fillings done.  Awaiting a partial that is being made  4.DM: -out of test stripes so not checking BS -taking Lantus 25 units QHS and Humalog 2 units with BF and dinner -endorses frequent urination but does drink a lot of water during the day due to fear of dehydration -numbness and sharp pains in lower legs and feet especially soles. Neurontin caused diarrhea "really bad."  Symptoms of numbness cccur 2-3 x a mth and last about 1 day -"I eat things I'm not suppose to like ice cream." Eats a pint 2 x a wk. Also likes honey mix with peanut butter.  -not getting in any exercise.  "Its been too hot and I hate to get out there in the heat."  5. CAD/HTN: -no CP or SOB. -+ swelling in legs and feet.  Attributes to sitting a lot during the day playing games on computer.  -"I eat more salt than I should."  -compliant with meds -has not seen cardiologist in yrs  Soc: lives with his sister wh does most of the cooking. Ambulates with a cane. No recent falls.  Patient Active Problem List   Diagnosis Date Noted  . Retinopathy due to secondary diabetes ([place]) 02/06/2016  . Diabetic polyneuropathy associated with type 2 diabetes mellitus ([place]) 01/12/2015  . DKA (diabetic ketoacidoses) ([place]) 05/06/2014  . Severe sepsis without septic shock ([place]) 05/05/2014  . Noncompliance with diabetes treatment 05/05/2014  .  Fournier's gangrene into true pelvis s/p I&D 05/05/2014 05/05/2014  . Poorly controlled type 2 diabetes mellitus ([place]) 07/17/2011  . HLD (hyperlipidemia) 07/17/2011  . Tachycardia-multifactorial 07/17/2011  . Coronary Artery Disease  07/17/2011  . Essential hypertension, benign 07/17/2011     Current Outpatient Prescriptions on File Prior to Visit  Medication Sig Dispense Refill  . aspirin EC 81 MG tablet Take 1 tablet (81 mg total) by mouth daily. 30 tablet 4  . dorzolamide-timolol (COSOPT) 22.3-6.8 MG/ML ophthalmic solution Place 1 drop into both eyes 2 (two) times daily.    Marland Kitchen glucose monitoring kit (FREESTYLE) monitoring kit 1 each by Does not apply route as needed for other. 1 each 0  . Multiple Vitamins-Minerals (MULTIVITAMIN WITH MINERALS) tablet Take 1 tablet by mouth daily.    . ondansetron (ZOFRAN ODT) 4 MG disintegrating tablet Take 1 tablet (4 mg total) by mouth every 8 (eight) hours as needed for nausea or vomiting. 20 tablet 0   Current Facility-Administered Medications on File Prior to Visit  Medication Dose Route Frequency Provider Last Rate Last Dose  . 0.9 %  sodium chloride infusion  500 mL Intravenous Continuous Gatha Mayer, MD        No Known Allergies  Social History   Social History  . Marital status: Single    Spouse name: N/A  . Number of children: N/A  . Years of education:  N/A   Occupational History  . [place]   Social History Main Topics  . Smoking status: Never Smoker  . Smokeless tobacco: Never Used  . Alcohol use No  . Drug use: No  . Sexual activity: No   Other Topics Concern  . Not on file   Social History Narrative   Works at [employer]    Family History  Problem Relation Age of Onset  . Coronary artery disease Father        Developed in his 67s  . Kidney disease Father   . Diabetes Father   . Melanoma Father   . Rectal cancer Father   . Heart failure Mother   . Hypertension Mother   .  Diabetes Sister   . Diabetes Brother   . Colon cancer Neg Hx   . Esophageal cancer Neg Hx   . Stomach cancer Neg Hx     Past Surgical History:  Procedure Laterality Date  . COLONOSCOPY  2000   Dr. Collene Mares  . EYE SURGERY    . IRRIGATION AND DEBRIDEMENT ABSCESS Left 05/05/2014   Procedure: IRRIGATION AND DEBRIDEMENT ABSCESS left buttock;  Surgeon: Michael Boston, MD;  Location: [facility];  Service: General;  Laterality: Left;  . LEFT HEART CATHETERIZATION WITH CORONARY ANGIOGRAM N/A 07/18/2011   Procedure: LEFT HEART CATHETERIZATION WITH CORONARY ANGIOGRAM;  Surgeon: Hillary Bow, MD;  Location: [facility] CATH LAB;  Service: Cardiovascular;  Laterality: N/A;  . PERCUTANEOUS CORONARY STENT INTERVENTION (PCI-S)  07/18/2011   Procedure: PERCUTANEOUS CORONARY STENT INTERVENTION (PCI-S);  Surgeon: Hillary Bow, MD;  Location: [facility] CATH LAB;  Service: Cardiovascular;;    ROS: Review of Systems Negative except as stated above PHYSICAL EXAM: BP (!) 150/80   Pulse 61   Temp 98 F (36.7 C) (Oral)   Resp 16   Wt 223 lb 3.2 oz (101.2 kg)   SpO2 99%   BMI 32.03 kg/m   150/80 Physical Exam General appearance - alert Caucasian male who looks older than stated age and appears slightly unkept. Clothing is soiled Mental status - alert, oriented to person, place, and time Mouth - mucous membranes moist, pharynx normal without lesions Neck - supple, no significant adenopathy Chest - clear to auscultation, no wheezes, rales or rhonchi, symmetric air entry Heart - normal rate, regular rhythm, normal S1, S2, no murmurs, rubs, clicks or gallops Extremities - 1+ bilateral lower extremity and pedal edema Skin - skin on lower legs and dorsum of the feet are very dry and scaly MSK: Walks with cane Diabetic Foot Exam - Simple   Simple Foot Form Visual Inspection No deformities, no ulcerations, no other skin breakdown bilaterally:  Yes Sensation Testing See comments:  Yes Pulse Check See comments:   Yes Comments DP and PT pulses 2+ BL. Feet warm. Decrease sensation on heels.  No callous or ulcers.  Toe nails are over grown.       Results for orders placed or performed in visit on 03/05/17  POCT glycosylated hemoglobin (Hb A1C)  Result Value Ref Range   Hemoglobin A1C 9.2   POCT glucose (manual entry)  Result Value Ref Range   POC Glucose 237 (A) 70 - 99 mg/dl   Depression screen PHQ 2/9 07/21/2016  Decreased Interest 3  Down, Depressed, Hopeless 1  PHQ - 2 Score 4  Altered sleeping 1  Tired, decreased energy 3  Change in appetite 2  Feeling bad or failure about yourself  0  Trouble concentrating 0  Moving slowly or fidgety/restless 2  Suicidal thoughts 0  PHQ-9 Score 12   GAD 7 : Generalized Anxiety Score 07/21/2016 03/25/2016  Nervous, Anxious, on Edge 0 0  Control/stop worrying 0 0  Worry too much - different things 2 0  Trouble relaxing 0 0  Restless 0 0  Easily annoyed or irritable 2 0  Afraid - awful might happen 0 0  Total GAD 7 Score 4 0     ASSESSMENT AND PLAN: 1. Uncontrolled type 2 diabetes mellitus with complication, with long-term current use of insulin ([place]) -Extensive dietary counseling discussed. Encouraged eating healthy snacks of fruits instead of sweet treats. -Refill strips and encouraged checking blood sugars twice a day and bring in log on visit with clinical pharmacists in 1 month -Increase Lantus from 25 to 28 units and Humalog to 3 units with breakfast and dinner. Counseled about symptoms of hypoglycemia and how to treat  - POCT glycosylated hemoglobin (Hb A1C) - POCT glucose (manual entry) - Insulin Pen Needle (BD PEN NEEDLE NANO U/F) 32G X 4 MM MISC; Use as directed  Dispense: 100 each; Refill: 5 - lisinopril-hydrochlorothiazide (PRINZIDE,ZESTORETIC) 10-12.5 MG tablet; Take 1 tablet by mouth daily.  Dispense: 90 tablet; Refill: 3 - Insulin Glargine (LANTUS SOLOSTAR) 100 UNIT/ML Solostar Pen; Inject 28 units every night before bedtime.  (around 10pm)  Dispense: 15 mL; Refill: 0 - insulin lispro (HUMALOG KWIKPEN) 100 UNIT/ML KiwkPen; Inject 0.03 mLs (3 Units total) into the skin 2 (two) times daily with a meal. Breakfast and supper, and pen needles 3/day  Dispense: 15 mL; Refill: 11 - glucose blood (ACCU-CHEK AVIVA PLUS) test strip; Use as instructed for 3 times daily testing of blood sugar. E11.9  Dispense: 100 each; Refill: 0 - Microalbumin / creatinine urine ratio - Comprehensive metabolic panel  2. Diabetic polyneuropathy associated with type 2 diabetes mellitus ([place]) Diabetic foot care discussed. He will clip his nails -Declines trial of Lyrica  3. Essential hypertension -Not at goal -Change lisinopril to lisinopril/HCTZ -Strongly encourage salt restriction  4. Hyperlipidemia, unspecified hyperlipidemia type - atorvastatin (LIPITOR) 40 MG tablet; TAKE 1 TABLET BY MOUTH EVERY DAY FOR CHOLESTEROL  Dispense: 90 tablet; Refill: 3 - Lipid panel  5. Coronary artery disease involving native coronary artery of native heart without angina pectoris Clinically stable. Continue aspirin, Lipitor.  6. Dry skin Encourage use of moisturizing lotion like Eucerin intensive care lotion or even use of Vaseline on the lower extremities   Patient was given the opportunity to ask questions.  Patient verbalized understanding of the plan and was able to repeat key elements of the plan.   Orders Placed This Encounter  Procedures  . Lipid panel  . Microalbumin / creatinine urine ratio  . Comprehensive metabolic panel  . POCT glycosylated hemoglobin (Hb A1C)  . POCT glucose (manual entry)     Requested Prescriptions   Signed Prescriptions Disp Refills  . Insulin Pen Needle (BD PEN NEEDLE NANO U/F) 32G X 4 MM MISC 100 each 5    Sig: Use as directed  . atorvastatin (LIPITOR) 40 MG tablet 90 tablet 3    Sig: TAKE 1 TABLET BY MOUTH EVERY DAY FOR CHOLESTEROL  . lisinopril-hydrochlorothiazide (PRINZIDE,ZESTORETIC) 10-12.5 MG tablet  90 tablet 3    Sig: Take 1 tablet by mouth daily.  . Insulin Glargine (LANTUS SOLOSTAR) 100 UNIT/ML Solostar Pen 15 mL 0    Sig: Inject 28 units every night before bedtime. (around 10pm)  . insulin lispro (  HUMALOG KWIKPEN) 100 UNIT/ML KiwkPen 15 mL 11    Sig: Inject 0.03 mLs (3 Units total) into the skin 2 (two) times daily with a meal. Breakfast and supper, and pen needles 3/day  . glucose blood (ACCU-CHEK AVIVA PLUS) test strip 100 each 0    Sig: Use as instructed for 3 times daily testing of blood sugar. E11.9    Return in about 3 months (around 06/05/2017).  Karle Plumber, MD, FACP

## 2017-03-05 NOTE — Patient Instructions (Addendum)
Please give appt with Chuck Hint in 1 month for titration of insulin  Increase Lantus to 28 units daily and Humalog to 3 units with meals. Lisinopril has been changed to a blood pressure medication called Lisinopril/HCTZ.  Cut back on eating sweet snacks and decrease the salt intake as discussed today. Use Eucerin Intensive Care Lotion for dry skin on feet.  Wear closed in shoes at all times and check your  feet regularly.   Follow a Healthy Eating Plan - You can do it! Limit sugary drinks.  Avoid sodas, sweet tea, sport or energy drinks, or fruit drinks.  Drink water, lo-fat milk, or diet drinks. Limit snack foods.   Cut back on candy, cake, cookies, chips, ice cream.  These are a special treat, only in small amounts. Eat plenty of vegetables.  Especially dark green, red, and orange vegetables. Aim for at least 3 servings a day. More is better! Include fruit in your daily diet.  Whole fruit is much healthier than fruit juice! Limit "white" bread, "white" pasta, "white" rice.   Choose "100% whole grain" products, brown or wild rice. Avoid fatty meats. Try "Meatless Monday" and choose eggs or beans one day a week.  When eating meat, choose lean meats like chicken, [place], and fish.  Grill, broil, or bake meats instead of frying, and eat poultry without the skin. Eat less salt.  Avoid frozen pizzas, frozen dinners and salty foods.  Use seasonings other than salt in cooking.  This can help blood pressure and keep you from swelling Beer, wine and liquor have calories.  If you can safely drink alcohol, limit to 1 drink per day for women, 2 drinks for men

## 2017-03-07 ENCOUNTER — Encounter: Payer: Self-pay | Admitting: Internal Medicine

## 2017-03-07 DIAGNOSIS — R809 Proteinuria, unspecified: Secondary | ICD-10-CM | POA: Insufficient documentation

## 2017-03-07 LAB — COMPREHENSIVE METABOLIC PANEL
A/G RATIO: 1.7 (ref 1.2–2.2)
ALBUMIN: 4.3 g/dL (ref 3.5–5.5)
ALT: 15 IU/L (ref 0–44)
AST: 18 IU/L (ref 0–40)
Alkaline Phosphatase: 61 IU/L (ref 39–117)
BILIRUBIN TOTAL: 0.9 mg/dL (ref 0.0–1.2)
BUN / CREAT RATIO: 15 (ref 9–20)
BUN: 13 mg/dL (ref 6–24)
CHLORIDE: 101 mmol/L (ref 96–106)
CO2: 27 mmol/L (ref 20–29)
Calcium: 9.4 mg/dL (ref 8.7–10.2)
Creatinine, Ser: 0.89 mg/dL (ref 0.76–1.27)
GFR calc non Af Amer: 94 mL/min/{1.73_m2} (ref >59)
GFR, EST AFRICAN AMERICAN: 108 mL/min/{1.73_m2} (ref >59)
GLOBULIN, TOTAL: 2.5 g/dL (ref 1.5–4.5)
Glucose: 241 mg/dL — ABNORMAL HIGH (ref 65–99)
POTASSIUM: 5.2 mmol/L (ref 3.5–5.2)
SODIUM: 139 mmol/L (ref 134–144)
TOTAL PROTEIN: 6.8 g/dL (ref 6.0–8.5)

## 2017-03-07 LAB — MICROALBUMIN / CREATININE URINE RATIO
Creatinine, Urine: 20.5 mg/dL
Microalb/Creat Ratio: 127.8 mg/g creat — ABNORMAL HIGH (ref 0.0–30.0)
Microalbumin, Urine: 26.2 ug/mL

## 2017-03-07 LAB — LIPID PANEL
CHOL/HDL RATIO: 3.3 ratio (ref 0.0–5.0)
Cholesterol, Total: 120 mg/dL (ref 100–199)
HDL: 36 mg/dL — AB (ref >39)
LDL CALC: 64 mg/dL (ref 0–99)
TRIGLYCERIDES: 99 mg/dL (ref 0–149)
VLDL Cholesterol Cal: 20 mg/dL (ref 5–40)

## 2017-03-20 ENCOUNTER — Telehealth: Payer: Self-pay

## 2017-03-20 NOTE — Telephone Encounter (Signed)
Contacted pt to go over lab results pt didn't answer lm with nephew to have pt to give [place] a call to go over lab results  If pt calls back please give results: cholesterol level is normal. His liver function is normal. He has protein in the urine which can be an early indication of diabetes affecting the kidneys. Good diabetes control and his blood pressure medication will help to slow down this process.

## 2017-03-25 DIAGNOSIS — E113511 Type 2 diabetes mellitus with proliferative diabetic retinopathy with macular edema, right eye: Secondary | ICD-10-CM | POA: Diagnosis not present

## 2017-04-02 ENCOUNTER — Ambulatory Visit: Payer: Medicare Other | Admitting: Pharmacist

## 2017-04-02 NOTE — Progress Notes (Deleted)
    S:     No chief complaint on file.   Patient arrives ***.  Presents for diabetes evaluation, education, and management at the request of Dr. Wynetta Emery. Patient was referred on 03/05/17.  Patient was last seen by Primary Care Provider on 03/05/17.   Patient {Actions; denies-reports:120008} adherence with medications.  Current diabetes medications include: Lantus 28 units daily, Humalog 3 units before breakfast and dinner Current hypertension medications include: lisinopril-HCTZ 10-12.5 mg daily   Patient {Actions; denies-reports:120008} hypoglycemic events.  Patient reported dietary habits:   Patient reported exercise habits:    Patient {Actions; denies-reports:120008} nocturia.  Patient {Actions; denies-reports:120008} neuropathy. Patient {Actions; denies-reports:120008} visual changes. Patient {Actions; denies-reports:120008} self foot exams.    O:  Physical Exam   ROS   Lab Results  Component Value Date   HGBA1C 9.2 03/05/2017   There were no vitals filed for this visit.  Home fasting CBG: ***  2 hour post-prandial/random CBG: ***.   A/P: Diabetes longstanding currently uncontrolled based on A1c of 9.2. Patient {Actions; denies-reports:120008} hypoglycemic events and is able to verbalize appropriate hypoglycemia management plan. Patient {Actions; denies-reports:120008} adherence with medication. Control is suboptimal due to dietary indiscretion and sedentary lifestyle.  Next A1C anticipated October 2018.    ASCVD risk greater than 7.5%.Patient on atorvastatin 40 mg daily and aspirin 81 mg daily. No recommendations for any changes.  Hypertension longstanding/newly diagnosed currently *** on lisinopril-HCTZ 10-12.5 mg daily.  Patient {Actions; denies-reports:120008} adherence with medication. Control is suboptimal due to ***.  Written patient instructions provided.  Total time in face to face counseling *** minutes.   Follow up in Pharmacist Clinic Visit ***.    Patient seen with Waverly Ferrari, PharmD Candidate

## 2017-04-15 DIAGNOSIS — E113593 Type 2 diabetes mellitus with proliferative diabetic retinopathy without macular edema, bilateral: Secondary | ICD-10-CM | POA: Diagnosis not present

## 2017-04-15 DIAGNOSIS — H4050X2 Glaucoma secondary to other eye disorders, unspecified eye, moderate stage: Secondary | ICD-10-CM | POA: Diagnosis not present

## 2017-04-15 DIAGNOSIS — Z961 Presence of intraocular lens: Secondary | ICD-10-CM | POA: Diagnosis not present

## 2017-04-26 ENCOUNTER — Other Ambulatory Visit: Payer: Self-pay | Admitting: Internal Medicine

## 2017-05-14 DIAGNOSIS — E113511 Type 2 diabetes mellitus with proliferative diabetic retinopathy with macular edema, right eye: Secondary | ICD-10-CM | POA: Diagnosis not present

## 2017-05-14 DIAGNOSIS — H4050X3 Glaucoma secondary to other eye disorders, unspecified eye, severe stage: Secondary | ICD-10-CM | POA: Diagnosis not present

## 2017-05-14 DIAGNOSIS — H35371 Puckering of macula, right eye: Secondary | ICD-10-CM | POA: Diagnosis not present

## 2017-05-14 DIAGNOSIS — E113599 Type 2 diabetes mellitus with proliferative diabetic retinopathy without macular edema, unspecified eye: Secondary | ICD-10-CM | POA: Diagnosis not present

## 2017-05-14 DIAGNOSIS — H211X1 Other vascular disorders of iris and ciliary body, right eye: Secondary | ICD-10-CM | POA: Diagnosis not present

## 2017-06-09 ENCOUNTER — Ambulatory Visit: Payer: Medicare Other | Attending: Internal Medicine | Admitting: Internal Medicine

## 2017-06-09 ENCOUNTER — Encounter: Payer: Self-pay | Admitting: Internal Medicine

## 2017-06-09 VITALS — BP 130/76 | HR 63 | Temp 97.6°F | Resp 16 | Wt 217.2 lb

## 2017-06-09 DIAGNOSIS — I2510 Atherosclerotic heart disease of native coronary artery without angina pectoris: Secondary | ICD-10-CM | POA: Diagnosis not present

## 2017-06-09 DIAGNOSIS — Z841 Family history of disorders of kidney and ureter: Secondary | ICD-10-CM | POA: Insufficient documentation

## 2017-06-09 DIAGNOSIS — Z7982 Long term (current) use of aspirin: Secondary | ICD-10-CM | POA: Insufficient documentation

## 2017-06-09 DIAGNOSIS — IMO0002 Reserved for concepts with insufficient information to code with codable children: Secondary | ICD-10-CM

## 2017-06-09 DIAGNOSIS — H548 Legal blindness, as defined in USA: Secondary | ICD-10-CM | POA: Insufficient documentation

## 2017-06-09 DIAGNOSIS — E1165 Type 2 diabetes mellitus with hyperglycemia: Secondary | ICD-10-CM | POA: Diagnosis not present

## 2017-06-09 DIAGNOSIS — Z955 Presence of coronary angioplasty implant and graft: Secondary | ICD-10-CM | POA: Diagnosis not present

## 2017-06-09 DIAGNOSIS — R809 Proteinuria, unspecified: Secondary | ICD-10-CM | POA: Insufficient documentation

## 2017-06-09 DIAGNOSIS — K529 Noninfective gastroenteritis and colitis, unspecified: Secondary | ICD-10-CM | POA: Diagnosis not present

## 2017-06-09 DIAGNOSIS — L853 Xerosis cutis: Secondary | ICD-10-CM

## 2017-06-09 DIAGNOSIS — E11319 Type 2 diabetes mellitus with unspecified diabetic retinopathy without macular edema: Secondary | ICD-10-CM | POA: Insufficient documentation

## 2017-06-09 DIAGNOSIS — E118 Type 2 diabetes mellitus with unspecified complications: Secondary | ICD-10-CM

## 2017-06-09 DIAGNOSIS — H409 Unspecified glaucoma: Secondary | ICD-10-CM | POA: Insufficient documentation

## 2017-06-09 DIAGNOSIS — Z79899 Other long term (current) drug therapy: Secondary | ICD-10-CM | POA: Insufficient documentation

## 2017-06-09 DIAGNOSIS — Z800 Family history of malignant neoplasm of digestive organs: Secondary | ICD-10-CM | POA: Insufficient documentation

## 2017-06-09 DIAGNOSIS — Z8249 Family history of ischemic heart disease and other diseases of the circulatory system: Secondary | ICD-10-CM | POA: Insufficient documentation

## 2017-06-09 DIAGNOSIS — Z833 Family history of diabetes mellitus: Secondary | ICD-10-CM | POA: Insufficient documentation

## 2017-06-09 DIAGNOSIS — E1142 Type 2 diabetes mellitus with diabetic polyneuropathy: Secondary | ICD-10-CM | POA: Diagnosis not present

## 2017-06-09 DIAGNOSIS — E785 Hyperlipidemia, unspecified: Secondary | ICD-10-CM | POA: Diagnosis not present

## 2017-06-09 DIAGNOSIS — E119 Type 2 diabetes mellitus without complications: Secondary | ICD-10-CM | POA: Diagnosis present

## 2017-06-09 DIAGNOSIS — I10 Essential (primary) hypertension: Secondary | ICD-10-CM | POA: Diagnosis not present

## 2017-06-09 DIAGNOSIS — Z794 Long term (current) use of insulin: Secondary | ICD-10-CM | POA: Diagnosis not present

## 2017-06-09 DIAGNOSIS — Z808 Family history of malignant neoplasm of other organs or systems: Secondary | ICD-10-CM | POA: Diagnosis not present

## 2017-06-09 DIAGNOSIS — Z23 Encounter for immunization: Secondary | ICD-10-CM | POA: Insufficient documentation

## 2017-06-09 LAB — GLUCOSE, POCT (MANUAL RESULT ENTRY): POC Glucose: 194 mg/dl — AB (ref 70–99)

## 2017-06-09 LAB — POCT GLYCOSYLATED HEMOGLOBIN (HGB A1C): HEMOGLOBIN A1C: 7.6

## 2017-06-09 MED ORDER — INSULIN GLARGINE 100 UNIT/ML SOLOSTAR PEN: 30.0000 [IU] | PEN_INJECTOR | Freq: Every day | SUBCUTANEOUS | 0 refills | Status: DC

## 2017-06-09 NOTE — Patient Instructions (Addendum)
Increase Lantus to 30 units at bedtime. Cut back on use of artificial sweeteners. He can use Imodium over-the-counter as needed. Cut back on salt in the diet. Try soaking your feet in warm water daily then using Eucerin Intensive Care Lotion to soften the skin.   Influenza Virus Vaccine injection (Fluarix)  What is this medicine? INFLUENZA VIRUS VACCINE (in floo EN zuh VAHY ruhs vak SEEN) helps to reduce the risk of getting influenza also known as the flu. This medicine may be used for other purposes; ask your health care provider or pharmacist if you have questions. COMMON BRAND NAME(S): Fluarix, Fluzone What should I tell my health care provider before I take this medicine? They need to know if you have any of these conditions: -bleeding disorder like hemophilia -fever or infection -Guillain-Barre syndrome or other neurological problems -immune system problems -infection with the human immunodeficiency virus (HIV) or AIDS -low blood platelet counts -multiple sclerosis -an unusual or allergic reaction to influenza virus vaccine, eggs, chicken proteins, latex, gentamicin, other medicines, foods, dyes or preservatives -pregnant or trying to get pregnant -breast-feeding How should I use this medicine? This vaccine is for injection into a muscle. It is given by a health care professional. A copy of Vaccine Information Statements will be given before each vaccination. Read this sheet carefully each time. The sheet may change frequently. Talk to your pediatrician regarding the use of this medicine in children. Special care may be needed. Overdosage: If you think you have taken too much of this medicine contact a poison control center or emergency room at once. NOTE: This medicine is only for you. Do not share this medicine with others. What if I miss a dose? This does not apply. What may interact with this medicine? -chemotherapy or radiation therapy -medicines that lower your immune  system like etanercept, anakinra, infliximab, and adalimumab -medicines that treat or prevent blood clots like warfarin -phenytoin -steroid medicines like prednisone or cortisone -theophylline -vaccines This list may not describe all possible interactions. Give your health care provider a list of all the medicines, herbs, non-prescription drugs, or dietary supplements you use. Also tell them if you smoke, drink alcohol, or use illegal drugs. Some items may interact with your medicine. What should I watch for while using this medicine? Report any side effects that do not go away within 3 days to your doctor or health care professional. Call your health care provider if any unusual symptoms occur within 6 weeks of receiving this vaccine. You may still catch the flu, but the illness is not usually as bad. You cannot get the flu from the vaccine. The vaccine will not protect against colds or other illnesses that may cause fever. The vaccine is needed every year. What side effects may I notice from receiving this medicine? Side effects that you should report to your doctor or health care professional as soon as possible: -allergic reactions like skin rash, itching or hives, swelling of the face, lips, or tongue Side effects that usually do not require medical attention (report to your doctor or health care professional if they continue or are bothersome): -fever -headache -muscle aches and pains -pain, tenderness, redness, or swelling at site where injected -weak or tired This list may not describe all possible side effects. Call your doctor for medical advice about side effects. You may report side effects to FDA at 1-800-FDA-1088. Where should I keep my medicine? This vaccine is only given in a clinic, pharmacy, doctor's office, or other health  care setting and will not be stored at home. NOTE: This sheet is a summary. It may not cover all possible information. If you have questions about this  medicine, talk to your doctor, pharmacist, or health care provider.  2018 Elsevier/Gold Standard (2008-03-01 09:30:40)

## 2017-06-09 NOTE — Progress Notes (Signed)
Patient ID: Derek Lowery, male    DOB: [birth year]  MRN: [id number]  CC: Diabetes   Subjective: Derek Lowery is a 61 y.o. male who presents for chronic ds management. Last seen 02/2017 His concerns today include:  61 yr old male with hx of DM type 2 with retinopathy(laser treatments by Dr. [name]), peripheral neuropathy, and microalbuminuria, HTN, CAD with stent OM2 in 2012, glaucoma (blind LT eye) and HL  1. "I have gas and diarrhea a lot."   Diarrhea x a few yrs; now having incontinence "where I mess the sheets up." Incontinence occurs about every couple of mths. -loose stools "real running." No blood in stools -BM 5-6 x a day. -worse with starchy foods like rice, bread and potatoes. Dairy products cause constipation. Uses a lot of artificial sweeteners -takes a MV tab, no Mg supplement. Not on Metformin.  -no previous surgery on bowel.  -normal colonoscopy 01/2017  2. DM: -check BS once every 2 wks. Good readings Compliant with Lantus 28 units and Humalog 3 units BID -he has cut back on sweets A1C has improve -afraid to walk because neighbor has a pit bull dog. Has a treadmill in his storage building  3. HTN: compliant with Lis/HCTZ. Not limiting salt -no CP/SOB. +LE edema that improved when Lisinopril changed to Lis/HCTZ  Patient Active Problem List   Diagnosis Date Noted  . Immunization due 06/09/2017  . Microalbuminuria 03/07/2017  . Dry skin 03/05/2017  . Glaucoma 03/05/2017  . Legally blind 03/05/2017  . Retinopathy due to secondary diabetes ([place]) 02/06/2016  . Diabetic polyneuropathy associated with type 2 diabetes mellitus ([place]) 01/12/2015  . Fournier's gangrene into true pelvis s/p I&D 05/05/2014 05/05/2014  . Poorly controlled type 2 diabetes mellitus ([place]) 07/17/2011  . HLD (hyperlipidemia) 07/17/2011  . Coronary Artery Disease  07/17/2011  . Essential hypertension 07/17/2011     Current Outpatient Prescriptions on File Prior to Visit  Medication Sig  Dispense Refill  . aspirin EC 81 MG tablet Take 1 tablet (81 mg total) by mouth daily. 30 tablet 4  . atorvastatin (LIPITOR) 40 MG tablet TAKE 1 TABLET BY MOUTH EVERY DAY FOR CHOLESTEROL 90 tablet 3  . dorzolamide-timolol (COSOPT) 22.3-6.8 MG/ML ophthalmic solution Place 1 drop into both eyes 2 (two) times daily.    Marland Kitchen glucose blood (ACCU-CHEK AVIVA PLUS) test strip Use as instructed for 3 times daily testing of blood sugar. E11.9 100 each 0  . glucose monitoring kit (FREESTYLE) monitoring kit 1 each by Does not apply route as needed for other. 1 each 0  . insulin lispro (HUMALOG KWIKPEN) 100 UNIT/ML KiwkPen Inject 0.03 mLs (3 Units total) into the skin 2 (two) times daily with a meal. Breakfast and supper, and pen needles 3/day 15 mL 11  . Insulin Pen Needle (BD PEN NEEDLE NANO U/F) 32G X 4 MM MISC Use as directed 100 each 5  . lisinopril-hydrochlorothiazide (PRINZIDE,ZESTORETIC) 10-12.5 MG tablet Take 1 tablet by mouth daily. 90 tablet 3  . Multiple Vitamins-Minerals (MULTIVITAMIN WITH MINERALS) tablet Take 1 tablet by mouth daily.    . ondansetron (ZOFRAN ODT) 4 MG disintegrating tablet Take 1 tablet (4 mg total) by mouth every 8 (eight) hours as needed for nausea or vomiting. 20 tablet 0   Current Facility-Administered Medications on File Prior to Visit  Medication Dose Route Frequency Provider Last Rate Last Dose  . 0.9 %  sodium chloride infusion  500 mL Intravenous Continuous Gatha Mayer, MD  No Known Allergies  Social History   Social History  . Marital status: Single    Spouse name: N/A  . Number of children: N/A  . Years of education: N/A   Occupational History  . [employer]   Social History Main Topics  . Smoking status: Never Smoker  . Smokeless tobacco: Never Used  . Alcohol use No  . Drug use: No  . Sexual activity: No   Other Topics Concern  . Not on file   Social History Narrative   Works at [employer]    Family  History  Problem Relation Age of Onset  . Coronary artery disease Father        Developed in his 13s  . Kidney disease Father   . Diabetes Father   . Melanoma Father   . Rectal cancer Father   . Heart failure Mother   . Hypertension Mother   . Diabetes Sister   . Diabetes Brother   . Colon cancer Neg Hx   . Esophageal cancer Neg Hx   . Stomach cancer Neg Hx     Past Surgical History:  Procedure Laterality Date  . COLONOSCOPY  2000   Dr. Collene Mares  . EYE SURGERY    . IRRIGATION AND DEBRIDEMENT ABSCESS Left 05/05/2014   Procedure: IRRIGATION AND DEBRIDEMENT ABSCESS left buttock;  Surgeon: Michael Boston, MD;  Location: [facility];  Service: General;  Laterality: Left;  . LEFT HEART CATHETERIZATION WITH CORONARY ANGIOGRAM N/A 07/18/2011   Procedure: LEFT HEART CATHETERIZATION WITH CORONARY ANGIOGRAM;  Surgeon: Hillary Bow, MD;  [facility] C[facility] LAB;  Service: Cardiovascular;  Laterality: N/A;  . PERCUTANEOUS CORONARY STENT INTERVENTION (PCI-S)  07/18/2011   Procedure: PERCUTANEOUS CORONARY STENT INTERVENTION (PCI-S);  Surgeon: Hillary Bow, MD;  Location: [facility] CATH LAB;  Service: Cardiovascular;;    ROS: Review of Systems Negative except as stated above He ambulates with a cane. Denies any falls PHYSICAL EXAM: BP 130/76   Pulse 63   Temp 97.6 F (36.4 C) (Oral)   Resp 16   Wt 217 lb 3.2 oz (98.5 kg)   SpO2 100%   BMI 31.16 kg/m   Physical Exam  General appearance - alert, well appearing, and in no distress Mental status - alert, oriented to person, place, and time, normal mood, behavior, speech, dress, motor activity, and thought processes. Patient is slightly unkept Neck - supple, no significant adenopathy Chest - clear to auscultation, no wheezes, rales or rhonchi, symmetric air entry Heart - normal rate, regular rhythm, normal S1, S2, no murmurs, rubs, clicks or gallops Rectal - no bulk stools palpated in the rectal vault  Extremities - trace to 1+ lower extremity  edema  Skin - skin on feet very dry with a scale appearance over the dorsal surface and toes. Dorsalis pedis pulses 3+ bilaterally. No ulcers on feet  Dep[facility] screen [facility] 2/9 06/09/2017 07/21/2016 03/25/2016 02/06/2016 11/23/2015  Decreased Interest 0 3 3 0 0  Down, Depressed, Hopeless 2 1 0 0 0  PHQ - 2 Score _0 0 0  Altered sleeping _1 - -  Tired, decreased energy _2 - -  Change in appetite 0 2 1 - -  Feeling bad or failure about yourself  0 0 0 - -  Trouble concentrating 0 0 0 - -  Moving slowly or fidgety/restless 0 2 0 - -  Suicidal thoughts 0 0 0 - -  PHQ-9 Score 6  12 6 - -    Results for orders placed or performed in visit on 06/09/17  POCT glucose (manual entry)  Result Value Ref Range   POC Glucose 194 (A) 70 - 99 mg/dl  POCT glycosylated hemoglobin (Hb A1C)  Result Value Ref Range   Hemoglobin A1C 7.6     ASSESSMENT AND PLAN: 1. Diabetes mellitus type 2, uncontrolled, with complications (HCC) -Clinically improved. Increase Lantus from 28 to30 units daily -Patient will try to set up his treadmill and start walking once to twice a week or 10 minutes - POCT glucose (manual entry) - POCT glycosylated hemoglobin (Hb A1C) - Insulin Glargine (LANTUS SOLOSTAR) 100 UNIT/ML Solostar Pen; Inject 30 Units into the skin daily.  Dispense: 15 mL; Refill: 0  2. Chronic diarrhea -Differential diagnoses include diabetic enteropathy, osmotic diarrhea due to frequent use of artificial sweeteners or foods containing high fructose corn syrup or celiac disease -Advised to cut back on the use of artificial sweeteners - Tissue Transglutaminase Abs,IgG,IgA  3. Essential hypertension At goal. Continue lisinopril/HCTZ -Advised to cut back on salt in the foods which will help decrease lower extremity swelling  4. Microalbuminuria -Discussed diagnosis with patient and importance of good diabetes and blood pressure control  5. Need for influenza vaccination - Flu Vaccine QUAD 6+ mos  PF IM (Fluarix Quad PF)  6. Dry skin Advised to soak feet in warm water to loosen up hard skin and use moisturizing lotion like Eucerin daily   Patient was given the opportunity to ask questions.  Patient verbalized understanding of the plan and was able to repeat key elements of the plan.   Orders Placed This Encounter  Procedures  . Flu Vaccine QUAD 6+ mos PF IM (Fluarix Quad PF)  . Tissue Transglutaminase Abs,IgG,IgA  . POCT glucose (manual entry)  . POCT glycosylated hemoglobin (Hb A1C)     Requested Prescriptions   Signed Prescriptions Disp Refills  . Insulin Glargine (LANTUS SOLOSTAR) 100 UNIT/ML Solostar Pen 15 mL 0    Sig: Inject 30 Units into the skin daily.    Return in about 1 month (around 07/10/2017) for diarrhea.  Karle Plumber, MD, FACP

## 2017-06-11 LAB — TISSUE TRANSGLUTAMINASE ABS,IGG,IGA

## 2017-06-18 DIAGNOSIS — E113511 Type 2 diabetes mellitus with proliferative diabetic retinopathy with macular edema, right eye: Secondary | ICD-10-CM | POA: Diagnosis not present

## 2017-06-18 DIAGNOSIS — H4050X3 Glaucoma secondary to other eye disorders, unspecified eye, severe stage: Secondary | ICD-10-CM | POA: Diagnosis not present

## 2017-06-18 DIAGNOSIS — H211X1 Other vascular disorders of iris and ciliary body, right eye: Secondary | ICD-10-CM | POA: Diagnosis not present

## 2017-06-18 DIAGNOSIS — H26491 Other secondary cataract, right eye: Secondary | ICD-10-CM | POA: Diagnosis not present

## 2017-06-18 DIAGNOSIS — H43811 Vitreous degeneration, right eye: Secondary | ICD-10-CM | POA: Diagnosis not present

## 2017-07-13 ENCOUNTER — Ambulatory Visit: Payer: Medicare Other | Attending: Internal Medicine | Admitting: Internal Medicine

## 2017-07-13 ENCOUNTER — Encounter: Payer: Self-pay | Admitting: Internal Medicine

## 2017-07-13 VITALS — BP 130/74 | HR 67 | Temp 97.8°F | Resp 18 | Ht 70.0 in | Wt 226.0 lb

## 2017-07-13 DIAGNOSIS — E11319 Type 2 diabetes mellitus with unspecified diabetic retinopathy without macular edema: Secondary | ICD-10-CM | POA: Insufficient documentation

## 2017-07-13 DIAGNOSIS — I2510 Atherosclerotic heart disease of native coronary artery without angina pectoris: Secondary | ICD-10-CM | POA: Diagnosis not present

## 2017-07-13 DIAGNOSIS — E1142 Type 2 diabetes mellitus with diabetic polyneuropathy: Secondary | ICD-10-CM | POA: Diagnosis not present

## 2017-07-13 DIAGNOSIS — Z9889 Other specified postprocedural states: Secondary | ICD-10-CM | POA: Insufficient documentation

## 2017-07-13 DIAGNOSIS — Z79899 Other long term (current) drug therapy: Secondary | ICD-10-CM | POA: Insufficient documentation

## 2017-07-13 DIAGNOSIS — Z955 Presence of coronary angioplasty implant and graft: Secondary | ICD-10-CM | POA: Diagnosis not present

## 2017-07-13 DIAGNOSIS — E6609 Other obesity due to excess calories: Secondary | ICD-10-CM | POA: Diagnosis not present

## 2017-07-13 DIAGNOSIS — K529 Noninfective gastroenteritis and colitis, unspecified: Secondary | ICD-10-CM | POA: Diagnosis not present

## 2017-07-13 DIAGNOSIS — Z833 Family history of diabetes mellitus: Secondary | ICD-10-CM | POA: Insufficient documentation

## 2017-07-13 DIAGNOSIS — E1165 Type 2 diabetes mellitus with hyperglycemia: Secondary | ICD-10-CM | POA: Diagnosis not present

## 2017-07-13 DIAGNOSIS — Z800 Family history of malignant neoplasm of digestive organs: Secondary | ICD-10-CM | POA: Insufficient documentation

## 2017-07-13 DIAGNOSIS — I10 Essential (primary) hypertension: Secondary | ICD-10-CM | POA: Insufficient documentation

## 2017-07-13 DIAGNOSIS — E668 Other obesity: Secondary | ICD-10-CM | POA: Insufficient documentation

## 2017-07-13 DIAGNOSIS — Z7982 Long term (current) use of aspirin: Secondary | ICD-10-CM | POA: Diagnosis not present

## 2017-07-13 DIAGNOSIS — Z6832 Body mass index (BMI) 32.0-32.9, adult: Secondary | ICD-10-CM | POA: Diagnosis not present

## 2017-07-13 DIAGNOSIS — H409 Unspecified glaucoma: Secondary | ICD-10-CM | POA: Insufficient documentation

## 2017-07-13 DIAGNOSIS — IMO0002 Reserved for concepts with insufficient information to code with codable children: Secondary | ICD-10-CM

## 2017-07-13 DIAGNOSIS — Z8249 Family history of ischemic heart disease and other diseases of the circulatory system: Secondary | ICD-10-CM | POA: Diagnosis not present

## 2017-07-13 DIAGNOSIS — E118 Type 2 diabetes mellitus with unspecified complications: Secondary | ICD-10-CM | POA: Diagnosis not present

## 2017-07-13 DIAGNOSIS — E785 Hyperlipidemia, unspecified: Secondary | ICD-10-CM | POA: Diagnosis not present

## 2017-07-13 DIAGNOSIS — Z794 Long term (current) use of insulin: Secondary | ICD-10-CM | POA: Insufficient documentation

## 2017-07-13 NOTE — Progress Notes (Signed)
Patient ID: Derek Lowery, male    DOB: 04-12-1956  MRN: [id number]  CC: Follow-up   Subjective: Derek Lowery is a 61 y.o. male who presents for 1 mth f/u diarrhea His concerns today include:  61 yr old male with hx of DM type 2 with retinopathy(laser treatments by Dr. [name]), peripheral neuropathy, and microalbuminuria, HTN, CAD with stent OM2 in 2012, glaucoma (blind LT eye) and HL  1. Diarrhea:  Hx from 06/09/2017:   "I have gas and diarrhea a lot."   Diarrhea x a few yrs; now having incontinence "where I mess the sheets up." Incontinence occurs about every couple of mths. -loose stools "real running." No blood in stools. + cramps and a lot of gas.  -BM 5-6 x a day. -worse with starchy foods like rice, bread and potatoes. Dairy products cause constipation. Uses a lot of artificial sweeteners. No hx of bowel surgery -takes a MV tab, no Mg supplement. Not on Metformin.  -no previous surgery on bowel.  -normal colonoscopy 01/2017 Today:  -tTG was neg. Takes Imodium once a wk with good results for 2-3 days -no further incontinence episodes  2. Gained 9 lbs over the Thanksgiving -ate a lot of potatoes and bread. Plans to cut back on that.  -he has not set up his treadmill as yet. Someone has promised to get it out of the shed and set it up for him in the coming wks -not been checking BS regularly. Last check was 117 one wk ago. He did inc Lantus to 30 units daily as advised on last visit. No low BS episodes.   Patient Active Problem List   Diagnosis Date Noted  . Immunization due 06/09/2017  . Chronic diarrhea 06/09/2017  . Microalbuminuria 03/07/2017  . Dry skin 03/05/2017  . Glaucoma 03/05/2017  . Legally blind 03/05/2017  . Retinopathy due to secondary diabetes ([place]) 02/06/2016  . Diabetic polyneuropathy associated with type 2 diabetes mellitus ([place]) 01/12/2015  . Fournier's gangrene into true pelvis s/p I&D 05/05/2014 05/05/2014  . Poorly controlled type 2 diabetes  mellitus ([place]) 07/17/2011  . HLD (hyperlipidemia) 07/17/2011  . Coronary Artery Disease  07/17/2011  . Essential hypertension 07/17/2011     Current Outpatient Medications on File Prior to Visit  Medication Sig Dispense Refill  . aspirin EC 81 MG tablet Take 1 tablet (81 mg total) by mouth daily. 30 tablet 4  . atorvastatin (LIPITOR) 40 MG tablet TAKE 1 TABLET BY MOUTH EVERY DAY FOR CHOLESTEROL 90 tablet 3  . dorzolamide-timolol (COSOPT) 22.3-6.8 MG/ML ophthalmic solution Place 1 drop into both eyes 2 (two) times daily.    Marland Kitchen glucose blood (ACCU-CHEK AVIVA PLUS) test strip Use as instructed for 3 times daily testing of blood sugar. E11.9 100 each 0  . glucose monitoring kit (FREESTYLE) monitoring kit 1 each by Does not apply route as needed for other. 1 each 0  . Insulin Glargine (LANTUS SOLOSTAR) 100 UNIT/ML Solostar Pen Inject 30 Units into the skin daily. 15 mL 0  . insulin lispro (HUMALOG KWIKPEN) 100 UNIT/ML KiwkPen Inject 0.03 mLs (3 Units total) into the skin 2 (two) times daily with a meal. Breakfast and supper, and pen needles 3/day 15 mL 11  . Insulin Pen Needle (BD PEN NEEDLE NANO U/F) 32G X 4 MM MISC Use as directed 100 each 5  . lisinopril-hydrochlorothiazide (PRINZIDE,ZESTORETIC) 10-12.5 MG tablet Take 1 tablet by mouth daily. 90 tablet 3  . Multiple Vitamins-Minerals (MULTIVITAMIN WITH MINERALS) tablet Take 1  tablet by mouth daily.    . ondansetron (ZOFRAN ODT) 4 MG disintegrating tablet Take 1 tablet (4 mg total) by mouth every 8 (eight) hours as needed for nausea or vomiting. 20 tablet 0   Current Facility-Administered Medications on File Prior to Visit  Medication Dose Route Frequency Provider Last Rate Last Dose  . 0.9 %  sodium chloride infusion  500 mL Intravenous Continuous Gatha Mayer, MD        No Known Allergies  Social History   Socioeconomic History  . Marital status: Single    Spouse name: Not on file  . Number of children: Not on file  . Years of  education: Not on file  . Highest education level: Not on file  Social Needs  . Financial resource strain: Not on file  . Food insecurity - worry: Not on file  . Food insecurity - inability: Not on file  . Transportation needs - medical: Not on file  . Transportation needs - non-medical: Not on file  Occupational History  . Occupation: Theatre stage manager: [employer]  Tobacco Use  . Smoking status: Never Smoker  . Smokeless tobacco: Never Used  Substance and Sexual Activity  . Alcohol use: No  . Drug use: No  . Sexual activity: No  Other Topics Concern  . Not on file  Social History Narrative   Works at [employer]    Family History  Problem Relation Age of Onset  . Coronary artery disease Father        Developed in his 71s  . Kidney disease Father   . Diabetes Father   . Melanoma Father   . Rectal cancer Father   . Heart failure Mother   . Hypertension Mother   . Diabetes Sister   . Diabetes Brother   . Colon cancer Neg Hx   . Esophageal cancer Neg Hx   . Stomach cancer Neg Hx     Past Surgical History:  Procedure Laterality Date  . COLONOSCOPY  2000   Dr. Collene Mares  . EYE SURGERY    . IRRIGATION AND DEBRIDEMENT ABSCESS Left 05/05/2014   Procedure: IRRIGATION AND DEBRIDEMENT ABSCESS left buttock;  Surgeon: Michael Boston, MD;  Location: [facility];  Service: General;  Laterality: Left;  . LEFT HEART CATHETERIZATION WITH CORONARY ANGIOGRAM N/A 07/18/2011   Procedure: LEFT HEART CATHETERIZATION WITH CORONARY ANGIOGRAM;  Surgeon: Hillary Bow, MD;  Location: [facility] CATH LAB;  Service: Cardiovascular;  Laterality: N/A;  . PERCUTANEOUS CORONARY STENT INTERVENTION (PCI-S)  07/18/2011   Procedure: PERCUTANEOUS CORONARY STENT INTERVENTION (PCI-S);  Surgeon: Hillary Bow, MD;  Location: [facility] CATH LAB;  Service: Cardiovascular;;    ROS: Review of Systems Negative except as stated above PHYSICAL EXAM: BP 130/74 (BP Location: Left Arm, Patient Position:  Sitting, Cuff Size: Normal)   Pulse 67   Temp 97.8 F (36.6 C) (Oral)   Resp 18   Ht 5' 10"  (1.778 m)   Wt 226 lb (102.5 kg)   SpO2 98%   BMI 32.43 kg/m   Wt Readings from Last 3 Encounters:  07/13/17 226 lb (102.5 kg)  06/09/17 217 lb 3.2 oz (98.5 kg)  03/05/17 223 lb 3.2 oz (101.2 kg)   Physical Exam General appearance - alert, well appearing, pleasant elderly Caucasian male and in no distress Mental status - alert, oriented to person, place, and time Chest - clear to auscultation, no wheezes, rales or rhonchi, symmetric air entry Heart -  normal rate, regular rhythm, normal S1, S2, no murmurs, rubs, clicks or gallops  Results for orders placed or performed in visit on 06/09/17  Tissue Transglutaminase Abs,IgG,IgA  Result Value Ref Range   Transglutaminase IgA <2 0 - 3 U/mL   Tissue Transglut Ab <2 0 - 5 U/mL  POCT glucose (manual entry)  Result Value Ref Range   POC Glucose 194 (A) 70 - 99 mg/dl  POCT glycosylated hemoglobin (Hb A1C)  Result Value Ref Range   Hemoglobin A1C 7.6     ASSESSMENT AND PLAN: 1. Chronic diarrhea without wgh loss DDX include microscopic colitis, bacterial overgrowth.  Would need to refer to GI for hydrogen breath test and colonic biopsies.  Patient declined stating he is okay for now with using Imodium.  This is reasonable as antidiarrhea medications would be the first line of treatment if indeed he had microscopic colitis  2. Class 1 obesity due to excess calories with serious comorbidity and body mass index (BMI) of 32.0 to 32.9 in adult -Advised to be mindful of portion sizes and limit intake of sweet/sugary desserts during the holidays -He will work on trying to get his treadmill from the shed 3. Uncontrolled type 2 diabetes mellitus with complication, with long-term current use of insulin ([place]) -Advised to write down blood sugar readings or bring in glucometer on next visit.   Patient was given the opportunity to ask questions.  Patient  verbalized understanding of the plan and was able to repeat key elements of the plan.   No orders of the defined types were placed in this encounter.    Requested Prescriptions    No prescriptions requested or ordered in this encounter    F/u in 3 mths  Karle Plumber, MD, Rosalita Chessman

## 2017-07-13 NOTE — Patient Instructions (Addendum)
Continue Imodium as needed.  Cut back on eating the white starches as discussed today.  I have enclosed some information on healthy eating habits.   Try to check your blood sugars at least once a day. Bring in your readings on next visit.   Follow a Healthy Eating Plan - You can do it! Limit sugary drinks.  Avoid sodas, sweet tea, sport or energy drinks, or fruit drinks.  Drink water, lo-fat milk, or diet drinks. Limit snack foods.   Cut back on candy, cake, cookies, chips, ice cream.  These are a special treat, only in small amounts. Eat plenty of vegetables.  Especially dark green, red, and orange vegetables. Aim for at least 3 servings a day. More is better! Include fruit in your daily diet.  Whole fruit is much healthier than fruit juice! Limit "white" bread, "white" pasta, "white" rice.   Choose "100% whole grain" products, brown or wild rice. Avoid fatty meats. Try "Meatless Monday" and choose eggs or beans one day a week.  When eating meat, choose lean meats like chicken, [place], and fish.  Grill, broil, or bake meats instead of frying, and eat poultry without the skin. Eat less salt.  Avoid frozen pizzas, frozen dinners and salty foods.  Use seasonings other than salt in cooking.  This can help blood pressure and keep you from swelling Beer, wine and liquor have calories.  If you can safely drink alcohol, limit to 1 drink per day for women, 2 drinks for men

## 2017-08-05 DIAGNOSIS — E113593 Type 2 diabetes mellitus with proliferative diabetic retinopathy without macular edema, bilateral: Secondary | ICD-10-CM | POA: Diagnosis not present

## 2017-08-05 DIAGNOSIS — Z961 Presence of intraocular lens: Secondary | ICD-10-CM | POA: Diagnosis not present

## 2017-08-05 DIAGNOSIS — H4050X2 Glaucoma secondary to other eye disorders, unspecified eye, moderate stage: Secondary | ICD-10-CM | POA: Diagnosis not present

## 2017-08-12 ENCOUNTER — Telehealth: Payer: Self-pay | Admitting: Internal Medicine

## 2017-08-12 DIAGNOSIS — E1165 Type 2 diabetes mellitus with hyperglycemia: Secondary | ICD-10-CM

## 2017-08-12 DIAGNOSIS — E118 Type 2 diabetes mellitus with unspecified complications: Principal | ICD-10-CM

## 2017-08-12 DIAGNOSIS — IMO0002 Reserved for concepts with insufficient information to code with codable children: Secondary | ICD-10-CM

## 2017-08-12 MED ORDER — INSULIN GLARGINE 100 UNIT/ML SOLOSTAR PEN: 30.0000 [IU] | PEN_INJECTOR | Freq: Every day | SUBCUTANEOUS | 0 refills | Status: DC

## 2017-08-12 NOTE — Telephone Encounter (Signed)
Pt. Called requesting a refill on Insulin Glargine (LANTUS SOLOSTAR) 100 UNIT/ML Solostar Pen Pt. Would like Rx sent to [facility] pharmacy. Please f/u

## 2017-08-12 NOTE — Telephone Encounter (Signed)
Refilled

## 2017-08-14 DIAGNOSIS — H4050X3 Glaucoma secondary to other eye disorders, unspecified eye, severe stage: Secondary | ICD-10-CM | POA: Diagnosis not present

## 2017-08-14 DIAGNOSIS — E113511 Type 2 diabetes mellitus with proliferative diabetic retinopathy with macular edema, right eye: Secondary | ICD-10-CM | POA: Diagnosis not present

## 2017-08-14 DIAGNOSIS — E113599 Type 2 diabetes mellitus with proliferative diabetic retinopathy without macular edema, unspecified eye: Secondary | ICD-10-CM | POA: Diagnosis not present

## 2017-08-14 DIAGNOSIS — H211X1 Other vascular disorders of iris and ciliary body, right eye: Secondary | ICD-10-CM | POA: Diagnosis not present

## 2017-08-18 HISTORY — PX: CATARACT EXTRACTION: SUR2

## 2017-09-10 DIAGNOSIS — E113511 Type 2 diabetes mellitus with proliferative diabetic retinopathy with macular edema, right eye: Secondary | ICD-10-CM | POA: Diagnosis not present

## 2017-09-18 ENCOUNTER — Inpatient Hospital Stay (HOSPITAL_COMMUNITY)
Admission: EM | Admit: 2017-09-18 | Discharge: 2017-10-01 | DRG: 418 | Disposition: A | Discharge status: Home / Self Care | Payer: Medicare Other | Attending: Internal Medicine | Admitting: Internal Medicine

## 2017-09-18 ENCOUNTER — Encounter (HOSPITAL_COMMUNITY): Payer: Self-pay

## 2017-09-18 DIAGNOSIS — I252 Old myocardial infarction: Secondary | ICD-10-CM | POA: Diagnosis not present

## 2017-09-18 DIAGNOSIS — I2510 Atherosclerotic heart disease of native coronary artery without angina pectoris: Secondary | ICD-10-CM | POA: Diagnosis present

## 2017-09-18 DIAGNOSIS — E1165 Type 2 diabetes mellitus with hyperglycemia: Secondary | ICD-10-CM | POA: Diagnosis present

## 2017-09-18 DIAGNOSIS — E7800 Pure hypercholesterolemia, unspecified: Secondary | ICD-10-CM | POA: Diagnosis present

## 2017-09-18 DIAGNOSIS — M1990 Unspecified osteoarthritis, unspecified site: Secondary | ICD-10-CM | POA: Diagnosis present

## 2017-09-18 DIAGNOSIS — E1142 Type 2 diabetes mellitus with diabetic polyneuropathy: Secondary | ICD-10-CM | POA: Diagnosis not present

## 2017-09-18 DIAGNOSIS — M47816 Spondylosis without myelopathy or radiculopathy, lumbar region: Secondary | ICD-10-CM | POA: Diagnosis not present

## 2017-09-18 DIAGNOSIS — K810 Acute cholecystitis: Secondary | ICD-10-CM | POA: Diagnosis not present

## 2017-09-18 DIAGNOSIS — I214 Non-ST elevation (NSTEMI) myocardial infarction: Secondary | ICD-10-CM

## 2017-09-18 DIAGNOSIS — R112 Nausea with vomiting, unspecified: Secondary | ICD-10-CM | POA: Diagnosis not present

## 2017-09-18 DIAGNOSIS — R110 Nausea: Secondary | ICD-10-CM

## 2017-09-18 DIAGNOSIS — E118 Type 2 diabetes mellitus with unspecified complications: Secondary | ICD-10-CM | POA: Diagnosis not present

## 2017-09-18 DIAGNOSIS — Z833 Family history of diabetes mellitus: Secondary | ICD-10-CM

## 2017-09-18 DIAGNOSIS — K2970 Gastritis, unspecified, without bleeding: Secondary | ICD-10-CM | POA: Diagnosis not present

## 2017-09-18 DIAGNOSIS — Z6830 Body mass index (BMI) 30.0-30.9, adult: Secondary | ICD-10-CM

## 2017-09-18 DIAGNOSIS — K5900 Constipation, unspecified: Secondary | ICD-10-CM | POA: Diagnosis not present

## 2017-09-18 DIAGNOSIS — E441 Mild protein-calorie malnutrition: Secondary | ICD-10-CM | POA: Diagnosis present

## 2017-09-18 DIAGNOSIS — K567 Ileus, unspecified: Secondary | ICD-10-CM | POA: Diagnosis not present

## 2017-09-18 DIAGNOSIS — K828 Other specified diseases of gallbladder: Secondary | ICD-10-CM | POA: Diagnosis not present

## 2017-09-18 DIAGNOSIS — E876 Hypokalemia: Secondary | ICD-10-CM | POA: Diagnosis not present

## 2017-09-18 DIAGNOSIS — R1010 Upper abdominal pain, unspecified: Secondary | ICD-10-CM | POA: Diagnosis not present

## 2017-09-18 DIAGNOSIS — I25118 Atherosclerotic heart disease of native coronary artery with other forms of angina pectoris: Secondary | ICD-10-CM

## 2017-09-18 DIAGNOSIS — N179 Acute kidney failure, unspecified: Secondary | ICD-10-CM

## 2017-09-18 DIAGNOSIS — I361 Nonrheumatic tricuspid (valve) insufficiency: Secondary | ICD-10-CM | POA: Diagnosis not present

## 2017-09-18 DIAGNOSIS — I959 Hypotension, unspecified: Secondary | ICD-10-CM | POA: Diagnosis not present

## 2017-09-18 DIAGNOSIS — K8000 Calculus of gallbladder with acute cholecystitis without obstruction: Principal | ICD-10-CM

## 2017-09-18 DIAGNOSIS — I5032 Chronic diastolic (congestive) heart failure: Secondary | ICD-10-CM | POA: Diagnosis not present

## 2017-09-18 DIAGNOSIS — I4510 Unspecified right bundle-branch block: Secondary | ICD-10-CM | POA: Diagnosis not present

## 2017-09-18 DIAGNOSIS — Z419 Encounter for procedure for purposes other than remedying health state, unspecified: Secondary | ICD-10-CM

## 2017-09-18 DIAGNOSIS — I10 Essential (primary) hypertension: Secondary | ICD-10-CM | POA: Diagnosis present

## 2017-09-18 DIAGNOSIS — H409 Unspecified glaucoma: Secondary | ICD-10-CM | POA: Diagnosis not present

## 2017-09-18 DIAGNOSIS — K7581 Nonalcoholic steatohepatitis (NASH): Secondary | ICD-10-CM | POA: Diagnosis not present

## 2017-09-18 DIAGNOSIS — R197 Diarrhea, unspecified: Secondary | ICD-10-CM | POA: Diagnosis present

## 2017-09-18 DIAGNOSIS — E13319 Other specified diabetes mellitus with unspecified diabetic retinopathy without macular edema: Secondary | ICD-10-CM | POA: Diagnosis present

## 2017-09-18 DIAGNOSIS — Z955 Presence of coronary angioplasty implant and graft: Secondary | ICD-10-CM

## 2017-09-18 DIAGNOSIS — E11621 Type 2 diabetes mellitus with foot ulcer: Secondary | ICD-10-CM | POA: Diagnosis not present

## 2017-09-18 DIAGNOSIS — Z7982 Long term (current) use of aspirin: Secondary | ICD-10-CM

## 2017-09-18 DIAGNOSIS — E785 Hyperlipidemia, unspecified: Secondary | ICD-10-CM | POA: Diagnosis not present

## 2017-09-18 DIAGNOSIS — Z8249 Family history of ischemic heart disease and other diseases of the circulatory system: Secondary | ICD-10-CM

## 2017-09-18 DIAGNOSIS — IMO0002 Reserved for concepts with insufficient information to code with codable children: Secondary | ICD-10-CM

## 2017-09-18 DIAGNOSIS — N178 Other acute kidney failure: Secondary | ICD-10-CM | POA: Diagnosis not present

## 2017-09-18 DIAGNOSIS — K9189 Other postprocedural complications and disorders of digestive system: Secondary | ICD-10-CM | POA: Diagnosis not present

## 2017-09-18 DIAGNOSIS — E119 Type 2 diabetes mellitus without complications: Secondary | ICD-10-CM | POA: Diagnosis not present

## 2017-09-18 DIAGNOSIS — R1110 Vomiting, unspecified: Secondary | ICD-10-CM

## 2017-09-18 DIAGNOSIS — F419 Anxiety disorder, unspecified: Secondary | ICD-10-CM | POA: Diagnosis present

## 2017-09-18 DIAGNOSIS — R0602 Shortness of breath: Secondary | ICD-10-CM

## 2017-09-18 DIAGNOSIS — R1114 Bilious vomiting: Secondary | ICD-10-CM

## 2017-09-18 DIAGNOSIS — Z794 Long term (current) use of insulin: Secondary | ICD-10-CM

## 2017-09-18 DIAGNOSIS — I110 Hypertensive heart disease with heart failure: Secondary | ICD-10-CM | POA: Diagnosis not present

## 2017-09-18 DIAGNOSIS — Z01818 Encounter for other preprocedural examination: Secondary | ICD-10-CM | POA: Diagnosis not present

## 2017-09-18 DIAGNOSIS — Z79899 Other long term (current) drug therapy: Secondary | ICD-10-CM

## 2017-09-18 DIAGNOSIS — E11319 Type 2 diabetes mellitus with unspecified diabetic retinopathy without macular edema: Secondary | ICD-10-CM | POA: Diagnosis present

## 2017-09-18 DIAGNOSIS — R1013 Epigastric pain: Secondary | ICD-10-CM

## 2017-09-18 NOTE — ED Notes (Signed)
Bed: WTR7 Expected date:  Expected time:  Means of arrival:  Comments: 

## 2017-09-18 NOTE — ED Notes (Signed)
Pt complains of no BM in two days and becomes nauseated after eating

## 2017-09-19 ENCOUNTER — Encounter (HOSPITAL_COMMUNITY): Payer: Self-pay | Admitting: Radiology

## 2017-09-19 ENCOUNTER — Emergency Department (HOSPITAL_COMMUNITY): Payer: Medicare Other

## 2017-09-19 ENCOUNTER — Other Ambulatory Visit: Payer: Self-pay

## 2017-09-19 DIAGNOSIS — K567 Ileus, unspecified: Secondary | ICD-10-CM | POA: Diagnosis not present

## 2017-09-19 DIAGNOSIS — I2510 Atherosclerotic heart disease of native coronary artery without angina pectoris: Secondary | ICD-10-CM | POA: Diagnosis not present

## 2017-09-19 DIAGNOSIS — F419 Anxiety disorder, unspecified: Secondary | ICD-10-CM | POA: Diagnosis present

## 2017-09-19 DIAGNOSIS — E11621 Type 2 diabetes mellitus with foot ulcer: Secondary | ICD-10-CM | POA: Diagnosis present

## 2017-09-19 DIAGNOSIS — R1110 Vomiting, unspecified: Secondary | ICD-10-CM | POA: Diagnosis not present

## 2017-09-19 DIAGNOSIS — E119 Type 2 diabetes mellitus without complications: Secondary | ICD-10-CM | POA: Diagnosis not present

## 2017-09-19 DIAGNOSIS — K8042 Calculus of bile duct with acute cholecystitis without obstruction: Secondary | ICD-10-CM | POA: Diagnosis not present

## 2017-09-19 DIAGNOSIS — K7581 Nonalcoholic steatohepatitis (NASH): Secondary | ICD-10-CM | POA: Diagnosis present

## 2017-09-19 DIAGNOSIS — I4510 Unspecified right bundle-branch block: Secondary | ICD-10-CM | POA: Diagnosis not present

## 2017-09-19 DIAGNOSIS — I5032 Chronic diastolic (congestive) heart failure: Secondary | ICD-10-CM | POA: Diagnosis not present

## 2017-09-19 DIAGNOSIS — I10 Essential (primary) hypertension: Secondary | ICD-10-CM | POA: Diagnosis not present

## 2017-09-19 DIAGNOSIS — E785 Hyperlipidemia, unspecified: Secondary | ICD-10-CM | POA: Diagnosis not present

## 2017-09-19 DIAGNOSIS — K9189 Other postprocedural complications and disorders of digestive system: Secondary | ICD-10-CM | POA: Diagnosis not present

## 2017-09-19 DIAGNOSIS — Z794 Long term (current) use of insulin: Secondary | ICD-10-CM

## 2017-09-19 DIAGNOSIS — K828 Other specified diseases of gallbladder: Secondary | ICD-10-CM | POA: Diagnosis not present

## 2017-09-19 DIAGNOSIS — K5900 Constipation, unspecified: Secondary | ICD-10-CM | POA: Diagnosis not present

## 2017-09-19 DIAGNOSIS — M47816 Spondylosis without myelopathy or radiculopathy, lumbar region: Secondary | ICD-10-CM | POA: Diagnosis present

## 2017-09-19 DIAGNOSIS — K8000 Calculus of gallbladder with acute cholecystitis without obstruction: Secondary | ICD-10-CM | POA: Diagnosis not present

## 2017-09-19 DIAGNOSIS — E441 Mild protein-calorie malnutrition: Secondary | ICD-10-CM | POA: Diagnosis present

## 2017-09-19 DIAGNOSIS — I959 Hypotension, unspecified: Secondary | ICD-10-CM | POA: Diagnosis not present

## 2017-09-19 DIAGNOSIS — R140 Abdominal distension (gaseous): Secondary | ICD-10-CM | POA: Diagnosis not present

## 2017-09-19 DIAGNOSIS — R197 Diarrhea, unspecified: Secondary | ICD-10-CM | POA: Diagnosis present

## 2017-09-19 DIAGNOSIS — R1010 Upper abdominal pain, unspecified: Secondary | ICD-10-CM | POA: Diagnosis not present

## 2017-09-19 DIAGNOSIS — K810 Acute cholecystitis: Secondary | ICD-10-CM | POA: Diagnosis present

## 2017-09-19 DIAGNOSIS — R1013 Epigastric pain: Secondary | ICD-10-CM | POA: Diagnosis not present

## 2017-09-19 DIAGNOSIS — N178 Other acute kidney failure: Secondary | ICD-10-CM | POA: Diagnosis not present

## 2017-09-19 DIAGNOSIS — N179 Acute kidney failure, unspecified: Secondary | ICD-10-CM | POA: Diagnosis not present

## 2017-09-19 DIAGNOSIS — E7800 Pure hypercholesterolemia, unspecified: Secondary | ICD-10-CM | POA: Diagnosis not present

## 2017-09-19 DIAGNOSIS — E1142 Type 2 diabetes mellitus with diabetic polyneuropathy: Secondary | ICD-10-CM | POA: Diagnosis not present

## 2017-09-19 DIAGNOSIS — Z01818 Encounter for other preprocedural examination: Secondary | ICD-10-CM

## 2017-09-19 DIAGNOSIS — E876 Hypokalemia: Secondary | ICD-10-CM | POA: Diagnosis present

## 2017-09-19 DIAGNOSIS — I252 Old myocardial infarction: Secondary | ICD-10-CM | POA: Diagnosis not present

## 2017-09-19 DIAGNOSIS — E118 Type 2 diabetes mellitus with unspecified complications: Secondary | ICD-10-CM | POA: Diagnosis not present

## 2017-09-19 DIAGNOSIS — R112 Nausea with vomiting, unspecified: Secondary | ICD-10-CM | POA: Diagnosis not present

## 2017-09-19 DIAGNOSIS — R1114 Bilious vomiting: Secondary | ICD-10-CM | POA: Diagnosis not present

## 2017-09-19 DIAGNOSIS — M1990 Unspecified osteoarthritis, unspecified site: Secondary | ICD-10-CM | POA: Diagnosis present

## 2017-09-19 DIAGNOSIS — E1165 Type 2 diabetes mellitus with hyperglycemia: Secondary | ICD-10-CM | POA: Diagnosis present

## 2017-09-19 DIAGNOSIS — R110 Nausea: Secondary | ICD-10-CM | POA: Diagnosis present

## 2017-09-19 DIAGNOSIS — H409 Unspecified glaucoma: Secondary | ICD-10-CM | POA: Diagnosis present

## 2017-09-19 DIAGNOSIS — R0602 Shortness of breath: Secondary | ICD-10-CM | POA: Diagnosis not present

## 2017-09-19 DIAGNOSIS — I110 Hypertensive heart disease with heart failure: Secondary | ICD-10-CM | POA: Diagnosis present

## 2017-09-19 DIAGNOSIS — I361 Nonrheumatic tricuspid (valve) insufficiency: Secondary | ICD-10-CM | POA: Diagnosis not present

## 2017-09-19 LAB — COMPREHENSIVE METABOLIC PANEL
ALBUMIN: 4.1 g/dL (ref 3.5–5.0)
ALT: 18 U/L (ref 17–63)
ANION GAP: 11 (ref 5–15)
AST: 23 U/L (ref 15–41)
Alkaline Phosphatase: 70 U/L (ref 38–126)
BUN: 17 mg/dL (ref 6–20)
CO2: 26 mmol/L (ref 22–32)
Calcium: 9.4 mg/dL (ref 8.9–10.3)
Chloride: 101 mmol/L (ref 101–111)
Creatinine, Ser: 0.7 mg/dL (ref 0.61–1.24)
GFR calc Af Amer: >60 mL/min (ref >60)
GFR calc non Af Amer: >60 mL/min (ref >60)
GLUCOSE: 207 mg/dL — AB (ref 65–99)
POTASSIUM: 3.2 mmol/L — AB (ref 3.5–5.1)
Sodium: 138 mmol/L (ref 135–145)
TOTAL PROTEIN: 7.7 g/dL (ref 6.5–8.1)
Total Bilirubin: 1.1 mg/dL (ref 0.3–1.2)

## 2017-09-19 LAB — CBC WITH DIFFERENTIAL/PLATELET
BASOS PCT: 0 %
Basophils Absolute: 0 10*3/uL (ref 0.0–0.1)
EOS ABS: 0 10*3/uL (ref 0.0–0.7)
Eosinophils Relative: 0 %
HCT: 39 % (ref 39.0–52.0)
Hemoglobin: 13.5 g/dL (ref 13.0–17.0)
Lymphocytes Relative: 9 %
Lymphs Abs: 1.2 10*3/uL (ref 0.7–4.0)
MCH: 29.1 pg (ref 26.0–34.0)
MCHC: 34.6 g/dL (ref 30.0–36.0)
MCV: 84.1 fL (ref 78.0–100.0)
MONOS PCT: 9 %
Monocytes Absolute: 1.1 10*3/uL — ABNORMAL HIGH (ref 0.1–1.0)
NEUTROS PCT: 82 %
Neutro Abs: 10.3 10*3/uL — ABNORMAL HIGH (ref 1.7–7.7)
PLATELETS: 172 10*3/uL (ref 150–400)
RBC: 4.64 MIL/uL (ref 4.22–5.81)
RDW: 13 % (ref 11.5–15.5)
WBC: 12.7 10*3/uL — ABNORMAL HIGH (ref 4.0–10.5)

## 2017-09-19 LAB — URINALYSIS, ROUTINE W REFLEX MICROSCOPIC
BACTERIA UA: NONE SEEN
Bilirubin Urine: NEGATIVE
Glucose, UA: >=500 mg/dL — AB
Hgb urine dipstick: NEGATIVE
Ketones, ur: 20 mg/dL — AB
Leukocytes, UA: NEGATIVE
Nitrite: NEGATIVE
Protein, ur: 100 mg/dL — AB
SPECIFIC GRAVITY, URINE: 1.04 — AB (ref 1.005–1.030)
pH: 6 (ref 5.0–8.0)

## 2017-09-19 LAB — I-STAT CG4 LACTIC ACID, ED: LACTIC ACID, VENOUS: 0.96 mmol/L (ref 0.5–1.9)

## 2017-09-19 LAB — LIPASE, BLOOD: Lipase: 23 U/L (ref 11–51)

## 2017-09-19 LAB — GLUCOSE, CAPILLARY: Glucose-Capillary: 240 mg/dL — ABNORMAL HIGH (ref 65–99)

## 2017-09-19 LAB — I-STAT TROPONIN, ED: TROPONIN I, POC: 0 ng/mL (ref 0.00–0.08)

## 2017-09-19 MED ORDER — IOPAMIDOL (ISOVUE-300) INJECTION 61%
100.0000 mL | Freq: Once | INTRAVENOUS | Status: AC | PRN
  Administered 2017-09-19: 100 mL via INTRAVENOUS

## 2017-09-19 MED ORDER — SODIUM CHLORIDE 0.9 % IV SOLN
INTRAVENOUS | Status: DC
  Administered 2017-09-19 – 2017-09-20 (×2): via INTRAVENOUS

## 2017-09-19 MED ORDER — DIPHENHYDRAMINE HCL 50 MG/ML IJ SOLN: 25.0000 mg | Freq: Four times a day (QID) | INTRAMUSCULAR | Status: DC | PRN

## 2017-09-19 MED ORDER — LISINOPRIL 10 MG PO TABS
10.0000 mg | ORAL_TABLET | Freq: Every day | ORAL | Status: DC
  Administered 2017-09-19: 10 mg via ORAL
  Filled 2017-09-19 (×2): qty 1

## 2017-09-19 MED ORDER — LISINOPRIL-HYDROCHLOROTHIAZIDE 10-12.5 MG PO TABS: 1.0000 | ORAL_TABLET | Freq: Every day | ORAL | Status: DC

## 2017-09-19 MED ORDER — PROMETHAZINE HCL 25 MG/ML IJ SOLN
25.0000 mg | Freq: Once | INTRAMUSCULAR | Status: AC
  Administered 2017-09-19: 25 mg via INTRAVENOUS
  Filled 2017-09-19: qty 1

## 2017-09-19 MED ORDER — IOPAMIDOL (ISOVUE-300) INJECTION 61%
INTRAVENOUS | Status: AC
  Filled 2017-09-19: qty 100

## 2017-09-19 MED ORDER — SODIUM CHLORIDE 0.9 % IV BOLUS (SEPSIS)
1000.0000 mL | Freq: Once | INTRAVENOUS | Status: AC
  Administered 2017-09-19: 1000 mL via INTRAVENOUS

## 2017-09-19 MED ORDER — AMLODIPINE BESYLATE 5 MG PO TABS
5.0000 mg | ORAL_TABLET | Freq: Every day | ORAL | Status: DC
  Administered 2017-09-19 – 2017-09-21 (×3): 5 mg via ORAL
  Filled 2017-09-19 (×4): qty 1

## 2017-09-19 MED ORDER — OXYCODONE HCL 5 MG PO TABS
5.0000 mg | ORAL_TABLET | ORAL | Status: DC | PRN
  Administered 2017-09-19 (×2): 5 mg via ORAL
  Administered 2017-09-19 – 2017-09-20 (×3): 10 mg via ORAL
  Administered 2017-09-20: 5 mg via ORAL
  Administered 2017-09-20 – 2017-09-21 (×3): 10 mg via ORAL
  Filled 2017-09-19 (×2): qty 1
  Filled 2017-09-19 (×6): qty 2
  Filled 2017-09-19: qty 1

## 2017-09-19 MED ORDER — PANTOPRAZOLE SODIUM 40 MG IV SOLR
40.0000 mg | Freq: Every day | INTRAVENOUS | Status: DC
  Administered 2017-09-19 – 2017-09-20 (×2): 40 mg via INTRAVENOUS
  Filled 2017-09-19 (×2): qty 40

## 2017-09-19 MED ORDER — TIMOLOL MALEATE 0.5 % OP SOLN
1.0000 [drp] | Freq: Two times a day (BID) | OPHTHALMIC | Status: DC
  Administered 2017-09-19 – 2017-10-01 (×25): 1 [drp] via OPHTHALMIC
  Filled 2017-09-19: qty 5

## 2017-09-19 MED ORDER — ONDANSETRON HCL 4 MG/2ML IJ SOLN
4.0000 mg | Freq: Once | INTRAMUSCULAR | Status: AC
  Administered 2017-09-19: 4 mg via INTRAVENOUS
  Filled 2017-09-19: qty 2

## 2017-09-19 MED ORDER — MORPHINE SULFATE (PF) 4 MG/ML IV SOLN
4.0000 mg | Freq: Once | INTRAVENOUS | Status: AC
  Administered 2017-09-19: 4 mg via INTRAVENOUS
  Filled 2017-09-19: qty 1

## 2017-09-19 MED ORDER — ACETAMINOPHEN 650 MG RE SUPP: 650.0000 mg | Freq: Four times a day (QID) | RECTAL | Status: DC | PRN

## 2017-09-19 MED ORDER — DIPHENHYDRAMINE HCL 25 MG PO CAPS: 25.0000 mg | ORAL_CAPSULE | Freq: Four times a day (QID) | ORAL | Status: DC | PRN

## 2017-09-19 MED ORDER — ONDANSETRON HCL 4 MG/2ML IJ SOLN
4.0000 mg | Freq: Four times a day (QID) | INTRAMUSCULAR | Status: DC | PRN
  Administered 2017-09-19 – 2017-09-29 (×12): 4 mg via INTRAVENOUS
  Filled 2017-09-19 (×11): qty 2

## 2017-09-19 MED ORDER — HYDRALAZINE HCL 20 MG/ML IJ SOLN
5.0000 mg | Freq: Three times a day (TID) | INTRAMUSCULAR | Status: DC | PRN
  Administered 2017-09-20: 5 mg via INTRAVENOUS
  Filled 2017-09-19: qty 1

## 2017-09-19 MED ORDER — ATORVASTATIN CALCIUM 40 MG PO TABS
40.0000 mg | ORAL_TABLET | Freq: Every day | ORAL | Status: DC
  Administered 2017-09-19 – 2017-09-30 (×12): 40 mg via ORAL
  Filled 2017-09-19 (×12): qty 1

## 2017-09-19 MED ORDER — DEXTROSE 5 % IV SOLN
2.0000 g | INTRAVENOUS | Status: DC
  Administered 2017-09-19 – 2017-09-20 (×2): 2 g via INTRAVENOUS
  Filled 2017-09-19 (×3): qty 2

## 2017-09-19 MED ORDER — MORPHINE SULFATE (PF) 2 MG/ML IV SOLN
2.0000 mg | INTRAVENOUS | Status: DC | PRN
  Administered 2017-09-20: 2 mg via INTRAVENOUS
  Filled 2017-09-19: qty 1

## 2017-09-19 MED ORDER — ENOXAPARIN SODIUM 40 MG/0.4ML ~~LOC~~ SOLN
40.0000 mg | SUBCUTANEOUS | Status: DC
  Administered 2017-09-19 – 2017-09-30 (×11): 40 mg via SUBCUTANEOUS
  Filled 2017-09-19 (×12): qty 0.4

## 2017-09-19 MED ORDER — POTASSIUM CHLORIDE 10 MEQ/100ML IV SOLN
10.0000 meq | INTRAVENOUS | Status: AC
  Administered 2017-09-19 (×2): 10 meq via INTRAVENOUS
  Filled 2017-09-19 (×2): qty 100

## 2017-09-19 MED ORDER — METOCLOPRAMIDE HCL 5 MG/ML IJ SOLN
10.0000 mg | Freq: Once | INTRAMUSCULAR | Status: AC
  Administered 2017-09-19: 10 mg via INTRAVENOUS
  Filled 2017-09-19: qty 2

## 2017-09-19 MED ORDER — ONDANSETRON 4 MG PO TBDP: 4.0000 mg | ORAL_TABLET | Freq: Four times a day (QID) | ORAL | Status: DC | PRN

## 2017-09-19 MED ORDER — ACETAMINOPHEN 325 MG PO TABS
650.0000 mg | ORAL_TABLET | Freq: Four times a day (QID) | ORAL | Status: DC | PRN
  Administered 2017-09-19: 650 mg via ORAL
  Filled 2017-09-19: qty 2

## 2017-09-19 MED ORDER — INSULIN ASPART 100 UNIT/ML ~~LOC~~ SOLN
0.0000 [IU] | Freq: Three times a day (TID) | SUBCUTANEOUS | Status: DC
  Administered 2017-09-19: 2 [IU] via SUBCUTANEOUS
  Administered 2017-09-19: 3 [IU] via SUBCUTANEOUS
  Administered 2017-09-20: 5 [IU] via SUBCUTANEOUS
  Administered 2017-09-20: 2 [IU] via SUBCUTANEOUS
  Administered 2017-09-20: 3 [IU] via SUBCUTANEOUS
  Administered 2017-09-21 (×2): 1 [IU] via SUBCUTANEOUS
  Administered 2017-09-21: 2 [IU] via SUBCUTANEOUS
  Administered 2017-09-22: 3 [IU] via SUBCUTANEOUS
  Filled 2017-09-19: qty 1

## 2017-09-19 MED ORDER — DORZOLAMIDE HCL 2 % OP SOLN
1.0000 [drp] | Freq: Two times a day (BID) | OPHTHALMIC | Status: DC
  Administered 2017-09-19 – 2017-10-01 (×25): 1 [drp] via OPHTHALMIC
  Filled 2017-09-19: qty 10

## 2017-09-19 NOTE — ED Notes (Signed)
Patient transported to CT 

## 2017-09-19 NOTE — ED Provider Notes (Signed)
[facility] DEPT Provider Note   CSN: [id number] Arrival date & time: 09/18/17  2325     History   Chief Complaint Chief Complaint  Patient presents with  . Abdominal Pain    HPI Derek Lowery is a 62 y.o. male with a hx of CAD, MI (NSTEMI), HTN, IDDM presents to the Emergency Department complaining of gradual, persistent, progressively worsening epigastric abd pain onset 2 days ago. Associated symptoms include vomiting and dry heaving.  Pt reports his emesis is bilious but nonbloody.  Eating or drinking anything makes his pain and nausea worse.  Pt denies fever, chills, headache, neck pain, chest pain, SOB, weakness, dizziness, syncope.  Last BM was 2-3 days ago.  Pt reports he had diarrhea at that time and took imodium which stopped the diarrhea, but he had a BM since.  No abd surgeries, but record review shows history of Fournier's gangrene.  Pt is unsure if he is passing gas.   The history is provided by the patient and medical records. No language interpreter was used.    Past Medical History:  Diagnosis Date  . Arthritis   . CAD (coronary artery disease)    NSTEMI 06/2011:  LHC 07/18/11: Proximal diagonal 60%, distal LAD with a diabetic appearance and 60% stenosis, OM2 with an occluded superior branch and an inferior branch with 90%, EF 55% with inferior hypokinesis.  PCI: Promus DES to the OM2 inferior branch.  This vessel provides collaterals to the superior branch which remained occluded.  Echocardiogram 07/18/11: EF 60%, normal wall motion.  . Cataract   . Essential hypertension, benign   . Glaucoma   . Hypercholesteremia   . Internal hemorrhoids   . Myocardial infarction ([place])   . Noncompliance   . Type 2 diabetes mellitus ([place]) 1990    Patient Active Problem List   Diagnosis Date Noted  . Immunization due 06/09/2017  . Chronic diarrhea 06/09/2017  . Microalbuminuria 03/07/2017  . Dry skin 03/05/2017  . Glaucoma 03/05/2017  .  Legally blind 03/05/2017  . Retinopathy due to secondary diabetes ([place]) 02/06/2016  . Diabetic polyneuropathy associated with type 2 diabetes mellitus ([place]) 01/12/2015  . Fournier's gangrene into true pelvis s/p I&D 05/05/2014 05/05/2014  . Poorly controlled type 2 diabetes mellitus ([place]) 07/17/2011  . HLD (hyperlipidemia) 07/17/2011  . Coronary Artery Disease  07/17/2011  . Essential hypertension 07/17/2011    Past Surgical History:  Procedure Laterality Date  . COLONOSCOPY  2000   Dr. Collene Mares  . EYE SURGERY    . IRRIGATION AND DEBRIDEMENT ABSCESS Left 05/05/2014   Procedure: IRRIGATION AND DEBRIDEMENT ABSCESS left buttock;  Surgeon: Michael Boston, MD;  Location: [facility] ORS;  Service: General;  Laterality: Left;  . LEFT HEART CATHETERIZATION WITH CORONARY ANGIOGRAM N/A 07/18/2011   Procedure: LEFT HEART CATHETERIZATION WITH CORONARY ANGIOGRAM;  Surgeon: Hillary Bow, MD;  Location: [facility] CATH LAB;  Service: Cardiovascular;  Laterality: N/A;  . PERCUTANEOUS CORONARY STENT INTERVENTION (PCI-S)  07/18/2011   Procedure: PERCUTANEOUS CORONARY STENT INTERVENTION (PCI-S);  Surgeon: Hillary Bow, MD;  Location: [facility] CATH LAB;  Service: Cardiovascular;;       Home Medications    Prior to Admission medications   Medication Sig Start Date End Date Taking? Authorizing Provider  aspirin EC 81 MG tablet Take 1 tablet (81 mg total) by mouth daily. 03/25/16  Yes Langeland, Dawn T, MD  atorvastatin (LIPITOR) 40 MG tablet TAKE 1 TABLET BY MOUTH EVERY DAY FOR CHOLESTEROL 03/05/17  Yes  Ladell Pier, MD  dorzolamide (TRUSOPT) 2 % ophthalmic solution Place 1 drop into the right eye 2 (two) times daily.   Yes [provider]  glucose blood (ACCU-CHEK AVIVA PLUS) test strip Use as instructed for 3 times daily testing of blood sugar. E11.9 03/05/17  Yes Ladell Pier, MD  glucose monitoring kit (FREESTYLE) monitoring kit 1 each by Does not apply route as needed for other. 05/10/14  Yes Hongalgi,  Lenis Dickinson, MD  Insulin Glargine (LANTUS SOLOSTAR) 100 UNIT/ML Solostar Pen Inject 30 Units into the skin daily. 08/12/17  Yes Ladell Pier, MD  insulin lispro (HUMALOG KWIKPEN) 100 UNIT/ML KiwkPen Inject 0.03 mLs (3 Units total) into the skin 2 (two) times daily with a meal. Breakfast and supper, and pen needles 3/day 03/05/17  Yes Ladell Pier, MD  Insulin Pen Needle (BD PEN NEEDLE NANO U/F) 32G X 4 MM MISC Use as directed 03/05/17  Yes Ladell Pier, MD  lisinopril-hydrochlorothiazide (PRINZIDE,ZESTORETIC) 10-12.5 MG tablet Take 1 tablet by mouth daily. 03/05/17  Yes Ladell Pier, MD  Multiple Vitamins-Minerals (MULTIVITAMIN WITH MINERALS) tablet Take 1 tablet by mouth daily.   Yes [provider]  timolol (BETIMOL) 0.5 % ophthalmic solution Place 1 drop into the right eye 2 (two) times daily.   Yes [provider]    Family History Family History  Problem Relation Age of Onset  . Coronary artery disease Father        Developed in his 71s  . Kidney disease Father   . Diabetes Father   . Melanoma Father   . Rectal cancer Father   . Heart failure Mother   . Hypertension Mother   . Diabetes Sister   . Diabetes Brother   . Colon cancer Neg Hx   . Esophageal cancer Neg Hx   . Stomach cancer Neg Hx     Social History Social History   Tobacco Use  . Smoking status: Never Smoker  . Smokeless tobacco: Never Used  Substance Use Topics  . Alcohol use: No  . Drug use: No     Allergies   Patient has no known allergies.   Review of Systems Review of Systems  Constitutional: Negative for appetite change, diaphoresis, fatigue, fever and unexpected weight change.  HENT: Negative for mouth sores.   Eyes: Negative for visual disturbance.  Respiratory: Negative for cough, chest tightness, shortness of breath and wheezing.   Cardiovascular: Negative for chest pain.  Gastrointestinal: Positive for abdominal pain, constipation, diarrhea (resolved),  nausea and vomiting.  Endocrine: Negative for polydipsia, polyphagia and polyuria.  Genitourinary: Negative for dysuria, frequency, hematuria and urgency.  Musculoskeletal: Negative for back pain and neck stiffness.  Skin: Negative for rash.  Allergic/Immunologic: Negative for immunocompromised state.  Neurological: Negative for syncope, light-headedness and headaches.  Hematological: Does not bruise/bleed easily.  Psychiatric/Behavioral: Negative for sleep disturbance. The patient is not nervous/anxious.      Physical Exam Updated Vital Signs BP (!) 165/78 (BP Location: Left Arm)   Pulse 80   Temp 98.9 F (37.2 C) (Oral)   Resp 18   SpO2 97%   Physical Exam  Constitutional: He appears well-developed and well-nourished. No distress.  Awake, alert, nontoxic appearance  HENT:  Head: Normocephalic and atraumatic.  Mouth/Throat: Oropharynx is clear and moist. No oropharyngeal exudate.  Eyes: Conjunctivae are normal. No scleral icterus.  Neck: Normal range of motion. Neck supple.  Cardiovascular: Normal rate, regular rhythm and intact distal pulses.  Pulmonary/Chest: Effort  normal and breath sounds normal. No respiratory distress. He has no wheezes.  Equal chest expansion  Abdominal: Soft. Bowel sounds are normal. He exhibits no mass. There is tenderness in the right upper quadrant and epigastric area. There is no rigidity, no rebound, no guarding, no CVA tenderness, no tenderness at McBurney's point and negative Murphy's sign.  Obese abdomen  Genitourinary:  Genitourinary Comments: Perineum evaluated by Dr. Randal Buba  Musculoskeletal: Normal range of motion. He exhibits no edema.  Neurological: He is alert.  Speech is clear and goal oriented Moves extremities without ataxia  Skin: Skin is warm and dry. He is not diaphoretic. There is pallor.  Psychiatric: He has a normal mood and affect.  Nursing note and vitals reviewed.    ED Treatments / Results  Labs (all labs ordered  are listed, but only abnormal results are displayed) Labs Reviewed  CBC WITH DIFFERENTIAL/PLATELET - Abnormal; Notable for the following components:      Result Value   WBC 12.7 (*)    Neutro Abs 10.3 (*)    Monocytes Absolute 1.1 (*)    All other components within normal limits  COMPREHENSIVE METABOLIC PANEL - Abnormal; Notable for the following components:   Potassium 3.2 (*)    Glucose, Bld 207 (*)    All other components within normal limits  CULTURE, BLOOD (ROUTINE X 2)  CULTURE, BLOOD (ROUTINE X 2)  LIPASE, BLOOD  URINALYSIS, ROUTINE W REFLEX MICROSCOPIC  I-STAT TROPONIN, ED  I-STAT CG4 LACTIC ACID, ED    EKG  EKG Interpretation  Date/Time:  Saturday September 19 2017 05:18:28 EST Ventricular Rate:  85 PR Interval:    QRS Duration: 125 QT Interval:  445 QTC Calculation: 530 R Axis:     Text Interpretation:  Sinus rhythm Prolonged PR interval Right bundle branch block Confirmed by Randal Buba, April ([id number]) on 09/19/2017 5:39:43 AM       Ct Abdomen Pelvis W Contrast  Result Date: 09/19/2017 CLINICAL DATA:  Abdominal distention. Nausea and vomiting. No bowel movement in 2 days. EXAM: CT ABDOMEN AND PELVIS WITH CONTRAST TECHNIQUE: Multidetector CT imaging of the abdomen and pelvis was performed using the standard protocol following bolus administration of intravenous contrast. CONTRAST:  179m ISOVUE-300 IOPAMIDOL (ISOVUE-300) INJECTION 61% COMPARISON:  Ultrasound right upper quadrant 09/19/2017. Abdominal series 09/19/2017. CT pelvis 05/05/2014 FINDINGS: Lower chest: Mild dependent changes in the lung bases. Hepatobiliary: Gallbladder is distended. No wall thickening. No focal stones identified. No bile duct dilatation. No focal liver lesions. Pancreas: Unremarkable. No pancreatic ductal dilatation or surrounding inflammatory changes. Spleen: Normal in size without focal abnormality. Adrenals/Urinary Tract: Adrenal glands are unremarkable. Kidneys are normal, without renal calculi,  focal lesion, or hydronephrosis. Bladder is unremarkable. Stomach/Bowel: Stomach, small bowel, and colon are mostly decompressed. Scattered stool within the colon. No wall thickening or inflammatory changes identified. Appendix is normal. Vascular/Lymphatic: Aortic atherosclerosis. No enlarged abdominal or pelvic lymph nodes. Reproductive: Prostate is unremarkable. Calcification of the vas deferens. Other: No free air or free fluid in the abdomen. Abdominal wall musculature appears intact. No mesenteric edema. Musculoskeletal: Mild degenerative changes in the spine and hips. No destructive bone lesions. IMPRESSION: 1. Distended gallbladder without wall thickening, stone, or infiltration. 2. Aortic atherosclerosis. 3. No bowel obstruction or inflammation. Electronically Signed   By: WLucienne CapersM.D.   On: 09/19/2017 06:19   UKoreaAbdomen Limited  Result Date: 09/19/2017 CLINICAL DATA:  Right upper quadrant pain, epigastric pain, and vomiting. EXAM: ULTRASOUND ABDOMEN LIMITED RIGHT UPPER QUADRANT COMPARISON:  None. FINDINGS: Gallbladder: Gallbladder is distended and filled with echogenic material consistent with sludge and/or small stones. No gallbladder wall thickening. Murphy's sign is negative. Common bile duct: Diameter: 4 mm, normal Liver: No focal lesion identified. Within normal limits in parenchymal echogenicity. Portal vein is patent on color Doppler imaging with normal direction of blood flow towards the liver. IMPRESSION: Distended gallbladder filled with echogenic material consistent with sludge or small stones. No additional changes to suggest cholecystitis. Electronically Signed   By: Lucienne Capers M.D.   On: 09/19/2017 05:15   Dg Abdomen Acute W/chest  Result Date: 09/19/2017 CLINICAL DATA:  Upper abdominal pain and nausea. No bowel movement over 2 days. EXAM: DG ABDOMEN ACUTE W/ 1V CHEST COMPARISON:  Chest 05/05/2014 FINDINGS: Normal heart size and pulmonary vascularity. No focal airspace  disease or consolidation in the lungs. No blunting of costophrenic angles. No pneumothorax. Mediastinal contours appear intact. Gas and stool throughout the colon. Hepatic flexure of the colon is focally no small or large bowel distention. No free intra-abdominal air. No abnormal air-fluid levels. No radiopaque stones. Calcification in the vas deferens. Displaced suggesting an enlarged gallbladder. IMPRESSION: No evidence of active pulmonary disease. Right upper quadrant soft tissues suggest an enlarged gallbladder. Nonobstructive bowel gas pattern. Electronically Signed   By: Lucienne Capers M.D.   On: 09/19/2017 03:40      Procedures Procedures (including critical care time)  Medications Ordered in ED Medications  iopamidol (ISOVUE-300) 61 % injection (not administered)  promethazine (PHENERGAN) injection 25 mg (not administered)  sodium chloride 0.9 % bolus 1,000 mL (0 mLs Intravenous Stopped 09/19/17 0539)  ondansetron (ZOFRAN) injection 4 mg (4 mg Intravenous Given 09/19/17 0421)  morphine 4 MG/ML injection 4 mg (4 mg Intravenous Given 09/19/17 0421)  ondansetron (ZOFRAN) injection 4 mg (4 mg Intravenous Given 09/19/17 0539)  iopamidol (ISOVUE-300) 61 % injection 100 mL (100 mLs Intravenous Contrast Given 09/19/17 0600)     Initial Impression / Assessment and Plan / ED Course  I have reviewed the triage vital signs and the nursing notes.  Pertinent labs & imaging results that were available during my care of the patient were reviewed by me and considered in my medical decision making (see chart for details).  Clinical Course as of Sep 19 636  Sat Sep 19, 2017  0353 Pt actively vomiting during exam  [HM]    Clinical Course User Index [HM] Diannah Rindfleisch, Jarrett [place], [place]    Patient presents with epigastric abdominal pain nausea and vomiting for the last several days.  On arrival he is hypertensive but is afebrile and without tachycardia.  His abdomen is soft without rebound or guarding.  He  does have epigastric and right upper quadrant tenderness.  Plain film shows right upper quadrant soft tissue with questionably enlarged gallbladder.  CT scan shows large gallbladder but no other abnormalities.  Ultrasound is without cholecystitis.  Personally evaluated the films.  Labs are generally reassuring.  With normal lactate.  Mild hypokalemia.  Mild leukocytosis at 12.7.  Patient continues to vomit after numerous doses of antiemetic.  His pain is well controlled at this time.  Patient will likely need surgical consult if his symptoms do not improve.  6:38 AM At shift change care was transferred to Geanie Kenning, PA-C who will follow pending studies, re-evaulate and determine disposition.     Final Clinical Impressions(s) / ED Diagnoses   Final diagnoses:  Epigastric pain  Bilious vomiting with nausea  Nausea    ED Discharge Orders  None       Tyffani Foglesong, Gwenlyn Perking 09/19/17 [id number]    Palumbo, April, MD 09/19/17 2035

## 2017-09-19 NOTE — ED Notes (Signed)
Pt requesting medication for the nausea and vomiting. Pt informed that we still need a urine sample. Pt states he will try to obtain another sample. MD will be made aware of pts request for medication

## 2017-09-19 NOTE — ED Notes (Signed)
Patient transported to X-ray 

## 2017-09-19 NOTE — ED Notes (Signed)
ED TO INPATIENT HANDOFF REPORT  Name/Age/Gender Derek Lowery 62 y.o. male  Code Status    Code Status Orders  (From admission, onward)        Start     Ordered   09/19/17 1121  Full code  Continuous     09/19/17 1120    Code Status History    Date Active Date Inactive Code Status Order ID Comments User Context   05/05/2014 02:41 05/10/2014 18:08 Full Code [id number]  Juanito Doom, MD Inpatient   05/05/2014 01:39 05/05/2014 02:41 Full Code [id number]  Vilinda Boehringer, MD ED      Home/SNF/Other Home  Chief Complaint abdominal pain  Level of Care/Admitting Diagnosis ED Disposition    ED Disposition Condition [place] [facility]: [facility] [[id number]]  Level of Care: Med-Surg [16]  Diagnosis: Acute cholecystitis due to biliary calculus [[id number]]  Admitting Physician: Graham, Dunbar  Attending Physician: CCS, MD [[id number]]  Estimated length of stay: past midnight tomorrow  Certification:: I certify this patient will need inpatient services for at least 2 midnights  Bed request comments: 5W  PT Class (Do Not Modify): Inpatient [101]  PT Acc Code (Do Not Modify): Private [1]       Medical History Past Medical History:  Diagnosis Date  . Arthritis   . CAD (coronary artery disease)    NSTEMI 06/2011:  LHC 07/18/11: Proximal diagonal 60%, distal LAD with a diabetic appearance and 60% stenosis, OM2 with an occluded superior branch and an inferior branch with 90%, EF 55% with inferior hypokinesis.  PCI: Promus DES to the OM2 inferior branch.  This vessel provides collaterals to the superior branch which remained occluded.  Echocardiogram 07/18/11: EF 60%, normal wall motion.  . Cataract   . Essential hypertension, benign   . Glaucoma   . Hypercholesteremia   . Internal hemorrhoids   . Myocardial infarction ([place])   . Noncompliance   . Type 2 diabetes mellitus ([place]) 1990    Allergies No Known Allergies  IV  Location/Drains/Wounds Patient Lines/Drains/Airways Status   Active Line/Drains/Airways    Name:   Placement date:   Placement time:   Site:   Days:   Peripheral IV 09/19/17 Right;Lateral Forearm   09/19/17    0204    Forearm   less than 1   Incision (Closed) 05/05/14 Perineum Other (Comment)   05/05/14    0357     1233          Labs/Imaging Results for orders placed or performed during the hospital encounter of 09/18/17 (from the past 48 hour(s))  CBC with Differential/Platelet     Status: Abnormal   Collection Time: 09/19/17  1:58 AM  Result Value Ref Range   WBC 12.7 (H) 4.0 - 10.5 K/uL   RBC 4.64 4.22 - 5.81 MIL/uL   Hemoglobin 13.5 13.0 - 17.0 g/dL   HCT 39.0 39.0 - 52.0 %   MCV 84.1 78.0 - 100.0 fL   MCH 29.1 26.0 - 34.0 pg   MCHC 34.6 30.0 - 36.0 g/dL   RDW 13.0 11.5 - 15.5 %   Platelets 172 150 - 400 K/uL   Neutrophils Relative % 82 %   Neutro Abs 10.3 (H) 1.7 - 7.7 K/uL   Lymphocytes Relative 9 %   Lymphs Abs 1.2 0.7 - 4.0 K/uL   Monocytes Relative 9 %   Monocytes Absolute 1.1 (H) 0.1 - 1.0 K/uL   Eosinophils Relative 0 %  Eosinophils Absolute 0.0 0.0 - 0.7 K/uL   Basophils Relative 0 %   Basophils Absolute 0.0 0.0 - 0.1 K/uL    Comment: Performed at [facility], [place] [address], [place], [place] [address]  Comprehensive metabolic panel     Status: Abnormal   Collection Time: 09/19/17  1:58 AM  Result Value Ref Range   Sodium 138 135 - 145 mmol/L   Potassium 3.2 (L) 3.5 - 5.1 mmol/L   Chloride 101 101 - 111 mmol/L   CO2 26 22 - 32 mmol/L   Glucose, Bld 207 (H) 65 - 99 mg/dL   BUN 17 6 - 20 mg/dL   Creatinine, Ser 0.70 0.61 - 1.24 mg/dL   Calcium 9.4 8.9 - 10.3 mg/dL   Total Protein 7.7 6.5 - 8.1 g/dL   Albumin 4.1 3.5 - 5.0 g/dL   AST 23 15 - 41 U/L   ALT 18 17 - 63 U/L   Alkaline Phosphatase 70 38 - 126 U/L   Total Bilirubin 1.1 0.3 - 1.2 mg/dL   GFR calc non Af Amer >60 >60 mL/min   GFR calc Af Amer >60 >60 mL/min    Comment:  (NOTE) The eGFR has been calculated using the CKD EPI equation. This calculation has not been validated in all clinical situations. eGFR's persistently <60 mL/min signify possible Chronic Kidney Disease.    Anion gap 11 5 - 15    Comment: Performed at [facility], [place] [address], [place], [place] [address]  Lipase, blood     Status: None   Collection Time: 09/19/17  1:58 AM  Result Value Ref Range   Lipase 23 11 - 51 U/L    Comment: Performed at [facility], [place] [address] [place], [place] [address]  I-stat troponin, ED     Status: None   Collection Time: 09/19/17  5:15 AM  Result Value Ref Range   Troponin i, poc 0.00 0.00 - 0.08 ng/mL   Comment 3            Comment: Due to the release kinetics of cTnI, a negative result within the first hours of the onset of symptoms does not rule out myocardial infarction with certainty. If myocardial infarction is still suspected, repeat the test at appropriate intervals.   I-Stat CG4 Lactic Acid, ED     Status: None   Collection Time: 09/19/17  5:45 AM  Result Value Ref Range   Lactic Acid, Venous 0.96 0.5 - 1.9 mmol/L   Ct Abdomen Pelvis W Contrast  Result Date: 09/19/2017 CLINICAL DATA:  Abdominal distention. Nausea and vomiting. No bowel movement in 2 days. EXAM: CT ABDOMEN AND PELVIS WITH CONTRAST TECHNIQUE: Multidetector CT imaging of the abdomen and pelvis was performed using the standard protocol following bolus administration of intravenous contrast. CONTRAST:  182m ISOVUE-300 IOPAMIDOL (ISOVUE-300) INJECTION 61% COMPARISON:  Ultrasound right upper quadrant 09/19/2017. Abdominal series 09/19/2017. CT pelvis 05/05/2014 FINDINGS: Lower chest: Mild dependent changes in the lung bases. Hepatobiliary: Gallbladder is distended. No wall thickening. No focal stones identified. No bile duct dilatation. No focal liver lesions. Pancreas: Unremarkable. No pancreatic ductal dilatation or surrounding  inflammatory changes. Spleen: Normal in size without focal abnormality. Adrenals/Urinary Tract: Adrenal glands are unremarkable. Kidneys are normal, without renal calculi, focal lesion, or hydronephrosis. Bladder is unremarkable. Stomach/Bowel: Stomach, small bowel, and colon are mostly decompressed. Scattered stool within the colon. No wall thickening or inflammatory changes identified. Appendix is normal. Vascular/Lymphatic: Aortic atherosclerosis. No  enlarged abdominal or pelvic lymph nodes. Reproductive: Prostate is unremarkable. Calcification of the vas deferens. Other: No free air or free fluid in the abdomen. Abdominal wall musculature appears intact. No mesenteric edema. Musculoskeletal: Mild degenerative changes in the spine and hips. No destructive bone lesions. IMPRESSION: 1. Distended gallbladder without wall thickening, stone, or infiltration. 2. Aortic atherosclerosis. 3. No bowel obstruction or inflammation. Electronically Signed   By: Lucienne Capers M.D.   On: 09/19/2017 06:19   US Abdomen Limited  Result Date: 09/19/2017 CLINICAL DATA:  Right upper quadrant pain, epigastric pain, and vomiting. EXAM: ULTRASOUND ABDOMEN LIMITED RIGHT UPPER QUADRANT COMPARISON:  None. FINDINGS: Gallbladder: Gallbladder is distended and filled with echogenic material consistent with sludge and/or small stones. No gallbladder wall thickening. Murphy's sign is negative. Common bile duct: Diameter: 4 mm, normal Liver: No focal lesion identified. Within normal limits in parenchymal echogenicity. Portal vein is patent on color Doppler imaging with normal direction of blood flow towards the liver. IMPRESSION: Distended gallbladder filled with echogenic material consistent with sludge or small stones. No additional changes to suggest cholecystitis. Electronically Signed   By: Lucienne Capers M.D.   On: 09/19/2017 05:15   Dg Abdomen Acute W/chest  Result Date: 09/19/2017 CLINICAL DATA:  Upper abdominal pain and nausea.  No bowel movement over 2 days. EXAM: DG ABDOMEN ACUTE W/ 1V CHEST COMPARISON:  Chest 05/05/2014 FINDINGS: Normal heart size and pulmonary vascularity. No focal airspace disease or consolidation in the lungs. No blunting of costophrenic angles. No pneumothorax. Mediastinal contours appear intact. Gas and stool throughout the colon. Hepatic flexure of the colon is focally no small or large bowel distention. No free intra-abdominal air. No abnormal air-fluid levels. No radiopaque stones. Calcification in the vas deferens. Displaced suggesting an enlarged gallbladder. IMPRESSION: No evidence of active pulmonary disease. Right upper quadrant soft tissues suggest an enlarged gallbladder. Nonobstructive bowel gas pattern. Electronically Signed   By: Lucienne Capers M.D.   On: 09/19/2017 03:40    Pending Labs Unresulted Labs (From admission, onward)   Start     Ordered   09/20/17 0500  Comprehensive metabolic panel  Tomorrow morning,   R     09/19/17 1120   09/20/17 0500  CBC  Tomorrow morning,   R     09/19/17 1120   09/20/17 2409  Basic metabolic panel  Tomorrow morning,   R     09/19/17 1120   09/19/17 1121  HIV antibody (Routine Testing)  Once,   R     09/19/17 1120   09/19/17 0546  Blood culture (routine x 2)  BLOOD CULTURE X 2,   STAT     09/19/17 0545   09/19/17 0353  Urinalysis, Routine w reflex microscopic  STAT,   R     09/19/17 0353      Vitals/Pain Today's Vitals   09/19/17 1400 09/19/17 1500 09/19/17 1530 09/19/17 1542  BP: (!) 153/95 (!) 170/90  (!) 167/93  Pulse: 78 78 79 79  Resp:    18  Temp:    98.9 F (37.2 C)  TempSrc:    Oral  SpO2: 94% 93% 96% 94%  PainSc:        Isolation Precautions No active isolations  Medications Medications  iopamidol (ISOVUE-300) 61 % injection (not administered)  atorvastatin (LIPITOR) tablet 40 mg (not administered)  dorzolamide (TRUSOPT) 2 % ophthalmic solution 1 drop (1 drop Right Eye Given 09/19/17 1315)  timolol (TIMOPTIC) 0.5 %  ophthalmic solution 1 drop (1 drop  Right Eye Given 09/19/17 1314)  enoxaparin (LOVENOX) injection 40 mg (not administered)  0.9 %  sodium chloride infusion ( Intravenous New Bag/Given 09/19/17 1207)  cefTRIAXone (ROCEPHIN) 2 g in dextrose 5 % 50 mL IVPB (0 g Intravenous Stopped 09/19/17 1316)  acetaminophen (TYLENOL) tablet 650 mg (not administered)    Or  acetaminophen (TYLENOL) suppository 650 mg (not administered)  oxyCODONE (Oxy IR/ROXICODONE) immediate release tablet 5-10 mg (5 mg Oral Given 09/19/17 1200)  morphine 2 MG/ML injection 2-4 mg (not administered)  diphenhydrAMINE (BENADRYL) capsule 25 mg (not administered)    Or  diphenhydrAMINE (BENADRYL) injection 25 mg (not administered)  ondansetron (ZOFRAN-ODT) disintegrating tablet 4 mg (not administered)    Or  ondansetron (ZOFRAN) injection 4 mg (not administered)  pantoprazole (PROTONIX) injection 40 mg (not administered)  insulin aspart (novoLOG) injection 0-9 Units (2 Units Subcutaneous Given 09/19/17 1315)  potassium chloride 10 mEq in 100 mL IVPB (0 mEq Intravenous Stopped 09/19/17 1449)  hydrALAZINE (APRESOLINE) injection 5 mg (not administered)  amLODipine (NORVASC) tablet 5 mg (5 mg Oral Given 09/19/17 1314)  lisinopril (PRINIVIL,ZESTRIL) tablet 10 mg (10 mg Oral Given 09/19/17 1314)  sodium chloride 0.9 % bolus 1,000 mL (0 mLs Intravenous Stopped 09/19/17 0539)  ondansetron (ZOFRAN) injection 4 mg (4 mg Intravenous Given 09/19/17 0421)  morphine 4 MG/ML injection 4 mg (4 mg Intravenous Given 09/19/17 0421)  ondansetron (ZOFRAN) injection 4 mg (4 mg Intravenous Given 09/19/17 0539)  iopamidol (ISOVUE-300) 61 % injection 100 mL (100 mLs Intravenous Contrast Given 09/19/17 0600)  promethazine (PHENERGAN) injection 25 mg (25 mg Intravenous Given 09/19/17 0639)  metoCLOPramide (REGLAN) injection 10 mg (10 mg Intravenous Given 09/19/17 0921)  morphine 4 MG/ML injection 4 mg (4 mg Intravenous Given 09/19/17 0934)    Mobility walks with device cane

## 2017-09-19 NOTE — ED Notes (Signed)
Pt provided with clear liquid meal tray per order

## 2017-09-19 NOTE — ED Notes (Signed)
PA aware of pts request for more meds

## 2017-09-19 NOTE — ED Notes (Signed)
Patient has urinal. Patient is aware we need urine specimen. Patient states "they usually end up having to cath me"

## 2017-09-19 NOTE — ED Notes (Signed)
Pt updated plan of care. Pt denies any other needs at this time.

## 2017-09-19 NOTE — H&P (Signed)
Derek Lowery is an 62 y.o. male.   Chief Complaint: RUQ abdominal pain/ nausea/ vomiting HPI: This is a 62 yo male with history of insulin-dependent diabetes, coronary artery disease, status post MI 2012 who presents to the emergency department with a 2-day history of epigastric and right upper quadrant abdominal pain.  This is associated with nausea and vomiting.  He has also had some diarrhea.  He has a history of Fournier's gangrene debrided by Dr. Johney Maine several years ago.  He does not follow with a cardiologist at this time.  He does have a stent that was placed in 2012.  Past Medical History:  Diagnosis Date  . Arthritis   . CAD (coronary artery disease)    NSTEMI 06/2011:  LHC 07/18/11: Proximal diagonal 60%, distal LAD with a diabetic appearance and 60% stenosis, OM2 with an occluded superior branch and an inferior branch with 90%, EF 55% with inferior hypokinesis.  PCI: Promus DES to the OM2 inferior branch.  This vessel provides collaterals to the superior branch which remained occluded.  Echocardiogram 07/18/11: EF 60%, normal wall motion.  . Cataract   . Essential hypertension, benign   . Glaucoma   . Hypercholesteremia   . Internal hemorrhoids   . Myocardial infarction ([place])   . Noncompliance   . Type 2 diabetes mellitus ([place]) 1990    Past Surgical History:  Procedure Laterality Date  . COLONOSCOPY  2000   Dr. Collene Mares  . EYE SURGERY    . IRRIGATION AND DEBRIDEMENT ABSCESS Left 05/05/2014   Procedure: IRRIGATION AND DEBRIDEMENT ABSCESS left buttock;  Surgeon: Michael Boston, MD;  Location: [facility];  Service: General;  Laterality: Left;  . LEFT HEART CATHETERIZATION WITH CORONARY ANGIOGRAM N/A 07/18/2011   Procedure: LEFT HEART CATHETERIZATION WITH CORONARY ANGIOGRAM;  Surgeon: Hillary Bow, MD;  Location: [facility] CATH LAB;  Service: Cardiovascular;  Laterality: N/A;  . PERCUTANEOUS CORONARY STENT INTERVENTION (PCI-S)  07/18/2011   Procedure: PERCUTANEOUS CORONARY STENT  INTERVENTION (PCI-S);  Surgeon: Hillary Bow, MD;  Location: [facility] CATH LAB;  Service: Cardiovascular;;    Family History  Problem Relation Age of Onset  . Coronary artery disease Father        Developed in his 68s  . Kidney disease Father   . Diabetes Father   . Melanoma Father   . Rectal cancer Father   . Heart failure Mother   . Hypertension Mother   . Diabetes Sister   . Diabetes Brother   . Colon cancer Neg Hx   . Esophageal cancer Neg Hx   . Stomach cancer Neg Hx    Social History:  reports that  has never smoked. he has never used smokeless tobacco. He reports that he does not drink alcohol or use drugs.  Allergies: No Known Allergies  Prior to Admission medications   Medication Sig Start Date End Date Taking? Authorizing Provider  aspirin EC 81 MG tablet Take 1 tablet (81 mg total) by mouth daily. 03/25/16  Yes Langeland, Dawn T, MD  atorvastatin (LIPITOR) 40 MG tablet TAKE 1 TABLET BY MOUTH EVERY DAY FOR CHOLESTEROL 03/05/17  Yes Ladell Pier, MD  dorzolamide (TRUSOPT) 2 % ophthalmic solution Place 1 drop into the right eye 2 (two) times daily.   Yes [provider]  glucose blood (ACCU-CHEK AVIVA PLUS) test strip Use as instructed for 3 times daily testing of blood sugar. E11.9 03/05/17  Yes Ladell Pier, MD  glucose monitoring kit (FREESTYLE) monitoring kit 1 each  by Does not apply route as needed for other. 05/10/14  Yes Hongalgi, Lenis Dickinson, MD  Insulin Glargine (LANTUS SOLOSTAR) 100 UNIT/ML Solostar Pen Inject 30 Units into the skin daily. 08/12/17  Yes Ladell Pier, MD  insulin lispro (HUMALOG KWIKPEN) 100 UNIT/ML KiwkPen Inject 0.03 mLs (3 Units total) into the skin 2 (two) times daily with a meal. Breakfast and supper, and pen needles 3/day 03/05/17  Yes Ladell Pier, MD  Insulin Pen Needle (BD PEN NEEDLE NANO U/F) 32G X 4 MM MISC Use as directed 03/05/17  Yes Ladell Pier, MD  lisinopril-hydrochlorothiazide (PRINZIDE,ZESTORETIC) 10-12.5  MG tablet Take 1 tablet by mouth daily. 03/05/17  Yes Ladell Pier, MD  Multiple Vitamins-Minerals (MULTIVITAMIN WITH MINERALS) tablet Take 1 tablet by mouth daily.   Yes [provider]  timolol (BETIMOL) 0.5 % ophthalmic solution Place 1 drop into the right eye 2 (two) times daily.   Yes [provider]     Results for orders placed or performed during the hospital encounter of 09/18/17 (from the past 48 hour(s))  CBC with Differential/Platelet     Status: Abnormal   Collection Time: 09/19/17  1:58 AM  Result Value Ref Range   WBC 12.7 (H) 4.0 - 10.5 K/uL   RBC 4.64 4.22 - 5.81 MIL/uL   Hemoglobin 13.5 13.0 - 17.0 g/dL   HCT 39.0 39.0 - 52.0 %   MCV 84.1 78.0 - 100.0 fL   MCH 29.1 26.0 - 34.0 pg   MCHC 34.6 30.0 - 36.0 g/dL   RDW 13.0 11.5 - 15.5 %   Platelets 172 150 - 400 K/uL   Neutrophils Relative % 82 %   Neutro Abs 10.3 (H) 1.7 - 7.7 K/uL   Lymphocytes Relative 9 %   Lymphs Abs 1.2 0.7 - 4.0 K/uL   Monocytes Relative 9 %   Monocytes Absolute 1.1 (H) 0.1 - 1.0 K/uL   Eosinophils Relative 0 %   Eosinophils Absolute 0.0 0.0 - 0.7 K/uL   Basophils Relative 0 %   Basophils Absolute 0.0 0.0 - 0.1 K/uL    Comment: Performed at [facility], [place] [address], [place], [place] [address]  Comprehensive metabolic panel     Status: Abnormal   Collection Time: 09/19/17  1:58 AM  Result Value Ref Range   Sodium 138 135 - 145 mmol/L   Potassium 3.2 (L) 3.5 - 5.1 mmol/L   Chloride 101 101 - 111 mmol/L   CO2 26 22 - 32 mmol/L   Glucose, Bld 207 (H) 65 - 99 mg/dL   BUN 17 6 - 20 mg/dL   Creatinine, Ser 0.70 0.61 - 1.24 mg/dL   Calcium 9.4 8.9 - 10.3 mg/dL   Total Protein 7.7 6.5 - 8.1 g/dL   Albumin 4.1 3.5 - 5.0 g/dL   AST 23 15 - 41 U/L   ALT 18 17 - 63 U/L   Alkaline Phosphatase 70 38 - 126 U/L   Total Bilirubin 1.1 0.3 - 1.2 mg/dL   GFR calc non Af Amer >60 >60 mL/min   GFR calc Af Amer >60 >60 mL/min    Comment: (NOTE) The eGFR has  been calculated using the CKD EPI equation. This calculation has not been validated in all clinical situations. eGFR's persistently <60 mL/min signify possible Chronic Kidney Disease.    Anion gap 11 5 - 15    Comment: Performed at [facility], [place] [address] [place], [place] [address]  Lipase,  blood     Status: None   Collection Time: 09/19/17  1:58 AM  Result Value Ref Range   Lipase 23 11 - 51 U/L    Comment: Performed at [facility], [place] [address] [place], [place] [address]  I-stat troponin, ED     Status: None   Collection Time: 09/19/17  5:15 AM  Result Value Ref Range   Troponin i, poc 0.00 0.00 - 0.08 ng/mL   Comment 3            Comment: Due to the release kinetics of cTnI, a negative result within the first hours of the onset of symptoms does not rule out myocardial infarction with certainty. If myocardial infarction is still suspected, repeat the test at appropriate intervals.   I-Stat CG4 Lactic Acid, ED     Status: None   Collection Time: 09/19/17  5:45 AM  Result Value Ref Range   Lactic Acid, Venous 0.96 0.5 - 1.9 mmol/L   Ct Abdomen Pelvis W Contrast  Result Date: 09/19/2017 CLINICAL DATA:  Abdominal distention. Nausea and vomiting. No bowel movement in 2 days. EXAM: CT ABDOMEN AND PELVIS WITH CONTRAST TECHNIQUE: Multidetector CT imaging of the abdomen and pelvis was performed using the standard protocol following bolus administration of intravenous contrast. CONTRAST:  170m ISOVUE-300 IOPAMIDOL (ISOVUE-300) INJECTION 61% COMPARISON:  Ultrasound right upper quadrant 09/19/2017. Abdominal series 09/19/2017. CT pelvis 05/05/2014 FINDINGS: Lower chest: Mild dependent changes in the lung bases. Hepatobiliary: Gallbladder is distended. No wall thickening. No focal stones identified. No bile duct dilatation. No focal liver lesions. Pancreas: Unremarkable. No pancreatic ductal dilatation or surrounding inflammatory changes. Spleen:  Normal in size without focal abnormality. Adrenals/Urinary Tract: Adrenal glands are unremarkable. Kidneys are normal, without renal calculi, focal lesion, or hydronephrosis. Bladder is unremarkable. Stomach/Bowel: Stomach, small bowel, and colon are mostly decompressed. Scattered stool within the colon. No wall thickening or inflammatory changes identified. Appendix is normal. Vascular/Lymphatic: Aortic atherosclerosis. No enlarged abdominal or pelvic lymph nodes. Reproductive: Prostate is unremarkable. Calcification of the vas deferens. Other: No free air or free fluid in the abdomen. Abdominal wall musculature appears intact. No mesenteric edema. Musculoskeletal: Mild degenerative changes in the spine and hips. No destructive bone lesions. IMPRESSION: 1. Distended gallbladder without wall thickening, stone, or infiltration. 2. Aortic atherosclerosis. 3. No bowel obstruction or inflammation. Electronically Signed   By: WLucienne CapersM.D.   On: 09/19/2017 06:19   UKoreaAbdomen Limited  Result Date: 09/19/2017 CLINICAL DATA:  Right upper quadrant pain, epigastric pain, and vomiting. EXAM: ULTRASOUND ABDOMEN LIMITED RIGHT UPPER QUADRANT COMPARISON:  None. FINDINGS: Gallbladder: Gallbladder is distended and filled with echogenic material consistent with sludge and/or small stones. No gallbladder wall thickening. Murphy's sign is negative. Common bile duct: Diameter: 4 mm, normal Liver: No focal lesion identified. Within normal limits in parenchymal echogenicity. Portal vein is patent on color Doppler imaging with normal direction of blood flow towards the liver. IMPRESSION: Distended gallbladder filled with echogenic material consistent with sludge or small stones. No additional changes to suggest cholecystitis. Electronically Signed   By: WLucienne CapersM.D.   On: 09/19/2017 05:15   Dg Abdomen Acute W/chest  Result Date: 09/19/2017 CLINICAL DATA:  Upper abdominal pain and nausea. No bowel movement over 2  days. EXAM: DG ABDOMEN ACUTE W/ 1V CHEST COMPARISON:  Chest 05/05/2014 FINDINGS: Normal heart size and pulmonary vascularity. No focal airspace disease or consolidation in the lungs. No blunting of costophrenic angles. No pneumothorax. Mediastinal contours appear  intact. Gas and stool throughout the colon. Hepatic flexure of the colon is focally no small or large bowel distention. No free intra-abdominal air. No abnormal air-fluid levels. No radiopaque stones. Calcification in the vas deferens. Displaced suggesting an enlarged gallbladder. IMPRESSION: No evidence of active pulmonary disease. Right upper quadrant soft tissues suggest an enlarged gallbladder. Nonobstructive bowel gas pattern. Electronically Signed   By: Lucienne Capers M.D.   On: 09/19/2017 03:40    Review of Systems  Constitutional: Negative for weight loss.  HENT: Negative for ear discharge, ear pain, hearing loss and tinnitus.   Eyes: Negative for blurred vision, double vision, photophobia and pain.  Respiratory: Negative for cough, sputum production and shortness of breath.   Cardiovascular: Negative for chest pain.  Gastrointestinal: Positive for abdominal pain, diarrhea, nausea and vomiting.  Genitourinary: Negative for dysuria, flank pain, frequency and urgency.  Musculoskeletal: Negative for back pain, falls, joint pain, myalgias and neck pain.  Neurological: Negative for dizziness, tingling, sensory change, focal weakness, loss of consciousness and headaches.  Endo/Heme/Allergies: Does not bruise/bleed easily.  Psychiatric/Behavioral: Negative for depression, memory loss and substance abuse. The patient is not nervous/anxious.     Blood pressure (!) 169/109, pulse 92, temperature 98.1 F (36.7 C), temperature source Oral, resp. rate 18, SpO2 97 %. Physical Exam  WDWN disheveled male in NAD Eyes:  Pupils equal, round; sclera anicteric HENT:  Oral mucosa moist; good dentition  Neck:  No masses palpated, no  thyromegaly Lungs:  CTA bilaterally; normal respiratory effort CV:  Regular rate and rhythm; no murmurs; extremities well-perfused with no edema Abd:  +bowel sounds, soft, tender in RUQ; no palpable masses; no organomegaly Skin:  Warm, dry; no sign of jaundice Psychiatric - alert and oriented x 4; calm mood and affect  Assessment/Plan Acute calculus cholecystitis Poorly controlled IDDM HX of CAD - history of MI - not followed by Cardiology On aspirin -last dose 09/18/17.  Admit to surgery for laparoscopic cholecystectomy this admission. Consult [facility] for management of diabetes, evaluate cardiac status to determine whether he needs to have Cardiology clearance for surgery.  Probable surgery Sunday or Monday.  Maia Petties, MD 09/19/2017, 9:47 AM

## 2017-09-19 NOTE — Consult Note (Signed)
[facility] Medical Consultation  Derek Lowery YKD:[id number] DOB: May 20, 1956 DOA: 09/18/2017 PCP: Ladell Pier, MD   Requesting physician:  Date of consultation: 09/19/17  Reason for consultation: management of DM. Planned surgery   Impression/Recommendations Active Problems:   Acute cholecystitis due to biliary calculus   62 y/o male with PMH of HTN, DM, CAD s/p stent (2012), presented with progressively worsening epigastric, RUQ abdominal pains, nausea, vomiting and being admitted for cholecystectomy.   Abdominal pain, suspected cholecystitis . management per surgery.  -preop: patient with stable chronic medical conditions. No acute cardiopulmonary or exertional symptoms. previous echo: EF 60%. ECG: RBBB. RCRI score 2. Recommended to cont home regimen or resume post op.   DM. Last ha1c-7.6 (05/2017). Will hold lantus while NPO. Cont iSS. Resume lantus post op.    Hypokalemia, will replace. Hold diuretic. Recheck, monitor   HTN. Uncontrolled. Cont lisinopril, holding hctz. Cont amlodipine. Prn hydralazine   CAD s/p stent (2012). No acute cardiopulmonary symptoms. No s/s of acute HF or significant arhythmia. Cont home aspirin if able per surgery, cont statin.   I will followup again tomorrow. Please contact me if I can be of assistance in the meanwhile. Thank you for this consultation.  Chief Complaint: abdominal pains, diarrhea   HPI:  62 y/o male with PMH of HTN, DM, CAD s/p stent (2012), presented with progressively worsening epigastric, RUQ abdominal pains for 2-3 days. He reports multiple episode of nausea, vomiting and diarrhea. No hematochezia or hematemesis. Denies sick contacts. No similar symptoms in the past. In the ED: ct abd showed distended gallbladder. Patient is being admitted by surgery for LAP cholecystectomy. hospitalist is called for consultation.  Patient denies acute chest pains, no shortness of breath, he denies exertional cardiopulmonary  symptoms. He states that he takes lantus 30U at night, and humalog BID. But not always complaint. He had history of PCI in 2012. No recurrence of cardiac symptoms. Takes aspirin at home.   Review of Systems:  Review of Systems - Negative except abdominal symptoms General ROS: negative Psychological ROS: negative Ophthalmic ROS: negative   Past Medical History:  Diagnosis Date  . Arthritis   . CAD (coronary artery disease)    NSTEMI 06/2011:  LHC 07/18/11: Proximal diagonal 60%, distal LAD with a diabetic appearance and 60% stenosis, OM2 with an occluded superior branch and an inferior branch with 90%, EF 55% with inferior hypokinesis.  PCI: Promus DES to the OM2 inferior branch.  This vessel provides collaterals to the superior branch which remained occluded.  Echocardiogram 07/18/11: EF 60%, normal wall motion.  . Cataract   . Essential hypertension, benign   . Glaucoma   . Hypercholesteremia   . Internal hemorrhoids   . Myocardial infarction ([place])   . Noncompliance   . Type 2 diabetes mellitus ([place]) 1990   Past Surgical History:  Procedure Laterality Date  . COLONOSCOPY  2000   Dr. Collene Mares  . EYE SURGERY    . IRRIGATION AND DEBRIDEMENT ABSCESS Left 05/05/2014   Procedure: IRRIGATION AND DEBRIDEMENT ABSCESS left buttock;  Surgeon: Michael Boston, MD;  Location: [facility];  Service: General;  Laterality: Left;  . LEFT HEART CATHETERIZATION WITH CORONARY ANGIOGRAM N/A 07/18/2011   Procedure: LEFT HEART CATHETERIZATION WITH CORONARY ANGIOGRAM;  Surgeon: Hillary Bow, MD;  Location: [facility] CATH LAB;  Service: Cardiovascular;  Laterality: N/A;  . PERCUTANEOUS CORONARY STENT INTERVENTION (PCI-S)  07/18/2011   Procedure: PERCUTANEOUS CORONARY STENT INTERVENTION (PCI-S);  Surgeon: Hillary Bow, MD;  Location:  [facility] CATH LAB;  Service: Cardiovascular;;   Social History:  reports that  has never smoked. he has never used smokeless tobacco. He reports that he does not drink alcohol or use  drugs.  No Known Allergies Family History  Problem Relation Age of Onset  . Coronary artery disease Father        Developed in his 65s  . Kidney disease Father   . Diabetes Father   . Melanoma Father   . Rectal cancer Father   . Heart failure Mother   . Hypertension Mother   . Diabetes Sister   . Diabetes Brother   . Colon cancer Neg Hx   . Esophageal cancer Neg Hx   . Stomach cancer Neg Hx     Prior to Admission medications   Medication Sig Start Date End Date Taking? Authorizing Provider  aspirin EC 81 MG tablet Take 1 tablet (81 mg total) by mouth daily. 03/25/16  Yes Langeland, Dawn T, MD  atorvastatin (LIPITOR) 40 MG tablet TAKE 1 TABLET BY MOUTH EVERY DAY FOR CHOLESTEROL 03/05/17  Yes Ladell Pier, MD  dorzolamide (TRUSOPT) 2 % ophthalmic solution Place 1 drop into the right eye 2 (two) times daily.   Yes [provider]  glucose blood (ACCU-CHEK AVIVA PLUS) test strip Use as instructed for 3 times daily testing of blood sugar. E11.9 03/05/17  Yes Ladell Pier, MD  glucose monitoring kit (FREESTYLE) monitoring kit 1 each by Does not apply route as needed for other. 05/10/14  Yes Hongalgi, Lenis Dickinson, MD  Insulin Glargine (LANTUS SOLOSTAR) 100 UNIT/ML Solostar Pen Inject 30 Units into the skin daily. 08/12/17  Yes Ladell Pier, MD  insulin lispro (HUMALOG KWIKPEN) 100 UNIT/ML KiwkPen Inject 0.03 mLs (3 Units total) into the skin 2 (two) times daily with a meal. Breakfast and supper, and pen needles 3/day 03/05/17  Yes Ladell Pier, MD  Insulin Pen Needle (BD PEN NEEDLE NANO U/F) 32G X 4 MM MISC Use as directed 03/05/17  Yes Ladell Pier, MD  lisinopril-hydrochlorothiazide (PRINZIDE,ZESTORETIC) 10-12.5 MG tablet Take 1 tablet by mouth daily. 03/05/17  Yes Ladell Pier, MD  Multiple Vitamins-Minerals (MULTIVITAMIN WITH MINERALS) tablet Take 1 tablet by mouth daily.   Yes [provider]  timolol (BETIMOL) 0.5 % ophthalmic solution Place 1  drop into the right eye 2 (two) times daily.   Yes [provider]   Physical Exam: Blood pressure (!) 169/109, pulse 87, temperature 98.1 F (36.7 C), temperature source Oral, resp. rate 18, SpO2 95 %. Vitals:   09/19/17 0800 09/19/17 0900  BP: (!) 164/112 (!) 169/109  Pulse: 90 87  Resp:  18  Temp:    SpO2: (!) 85% 95%     General:  Alert no distress.   Eyes: eom-I, perrle   ENT: no oral ulcer  Neck: supple, no JVD  Cardiovascular: s1,s2 rrr  Respiratory: CTA BL  Abdomen: soft, obese, mild RUQ tenderness   Skin: no ulcers   Musculoskeletal: no pedal edema   Psychiatric: no hallucinations,   Neurologic: CN 2-12 intact. Motor 5/5 BL   Labs on Admission:  Basic Metabolic Panel: Recent Labs  Lab 09/19/17 0158  NA 138  K 3.2*  CL 101  CO2 26  GLUCOSE 207*  BUN 17  CREATININE 0.70  CALCIUM 9.4   Liver Function Tests: Recent Labs  Lab 09/19/17 0158  AST 23  ALT 18  ALKPHOS 70  BILITOT 1.1  PROT 7.7  ALBUMIN  4.1   Recent Labs  Lab 09/19/17 0158  LIPASE 23   No results for input(s): AMMONIA in the last 168 hours. CBC: Recent Labs  Lab 09/19/17 0158  WBC 12.7*  NEUTROABS 10.3*  HGB 13.5  HCT 39.0  MCV 84.1  PLT 172   Cardiac Enzymes: No results for input(s): CKTOTAL, CKMB, CKMBINDEX, TROPONINI in the last 168 hours. BNP: Invalid input(s): POCBNP CBG: No results for input(s): GLUCAP in the last 168 hours.  Radiological Exams on Admission: Ct Abdomen Pelvis W Contrast  Result Date: 09/19/2017 CLINICAL DATA:  Abdominal distention. Nausea and vomiting. No bowel movement in 2 days. EXAM: CT ABDOMEN AND PELVIS WITH CONTRAST TECHNIQUE: Multidetector CT imaging of the abdomen and pelvis was performed using the standard protocol following bolus administration of intravenous contrast. CONTRAST:  132m ISOVUE-300 IOPAMIDOL (ISOVUE-300) INJECTION 61% COMPARISON:  Ultrasound right upper quadrant 09/19/2017. Abdominal series 09/19/2017. CT  pelvis 05/05/2014 FINDINGS: Lower chest: Mild dependent changes in the lung bases. Hepatobiliary: Gallbladder is distended. No wall thickening. No focal stones identified. No bile duct dilatation. No focal liver lesions. Pancreas: Unremarkable. No pancreatic ductal dilatation or surrounding inflammatory changes. Spleen: Normal in size without focal abnormality. Adrenals/Urinary Tract: Adrenal glands are unremarkable. Kidneys are normal, without renal calculi, focal lesion, or hydronephrosis. Bladder is unremarkable. Stomach/Bowel: Stomach, small bowel, and colon are mostly decompressed. Scattered stool within the colon. No wall thickening or inflammatory changes identified. Appendix is normal. Vascular/Lymphatic: Aortic atherosclerosis. No enlarged abdominal or pelvic lymph nodes. Reproductive: Prostate is unremarkable. Calcification of the vas deferens. Other: No free air or free fluid in the abdomen. Abdominal wall musculature appears intact. No mesenteric edema. Musculoskeletal: Mild degenerative changes in the spine and hips. No destructive bone lesions. IMPRESSION: 1. Distended gallbladder without wall thickening, stone, or infiltration. 2. Aortic atherosclerosis. 3. No bowel obstruction or inflammation. Electronically Signed   By: WLucienne CapersM.D.   On: 09/19/2017 06:19   UKoreaAbdomen Limited  Result Date: 09/19/2017 CLINICAL DATA:  Right upper quadrant pain, epigastric pain, and vomiting. EXAM: ULTRASOUND ABDOMEN LIMITED RIGHT UPPER QUADRANT COMPARISON:  None. FINDINGS: Gallbladder: Gallbladder is distended and filled with echogenic material consistent with sludge and/or small stones. No gallbladder wall thickening. Murphy's sign is negative. Common bile duct: Diameter: 4 mm, normal Liver: No focal lesion identified. Within normal limits in parenchymal echogenicity. Portal vein is patent on color Doppler imaging with normal direction of blood flow towards the liver. IMPRESSION: Distended gallbladder  filled with echogenic material consistent with sludge or small stones. No additional changes to suggest cholecystitis. Electronically Signed   By: WLucienne CapersM.D.   On: 09/19/2017 05:15   Dg Abdomen Acute W/chest  Result Date: 09/19/2017 CLINICAL DATA:  Upper abdominal pain and nausea. No bowel movement over 2 days. EXAM: DG ABDOMEN ACUTE W/ 1V CHEST COMPARISON:  Chest 05/05/2014 FINDINGS: Normal heart size and pulmonary vascularity. No focal airspace disease or consolidation in the lungs. No blunting of costophrenic angles. No pneumothorax. Mediastinal contours appear intact. Gas and stool throughout the colon. Hepatic flexure of the colon is focally no small or large bowel distention. No free intra-abdominal air. No abnormal air-fluid levels. No radiopaque stones. Calcification in the vas deferens. Displaced suggesting an enlarged gallbladder. IMPRESSION: No evidence of active pulmonary disease. Right upper quadrant soft tissues suggest an enlarged gallbladder. Nonobstructive bowel gas pattern. Electronically Signed   By: WLucienne CapersM.D.   On: 09/19/2017 03:40    EKG: Independently reviewed.   Time spent: >  78 minutes   Kinnie Feil [facility] Pager [phone]  If 7PM-7AM, please contact night-coverage [email or web address] Password [email or web address] 09/19/2017, 11:02 AM

## 2017-09-19 NOTE — ED Notes (Signed)
Pt unable to tolerate K+ at 112m/hr. Rate reduced to 768mhr

## 2017-09-19 NOTE — ED Notes (Signed)
Ultrasound in progress

## 2017-09-20 ENCOUNTER — Inpatient Hospital Stay (HOSPITAL_COMMUNITY): Payer: Medicare Other | Admitting: Anesthesiology

## 2017-09-20 ENCOUNTER — Encounter (HOSPITAL_COMMUNITY)
Admission: EM | Disposition: A | Discharge status: Home / Self Care | Payer: Self-pay | Source: Home / Self Care | Attending: Internal Medicine

## 2017-09-20 ENCOUNTER — Encounter (HOSPITAL_COMMUNITY): Payer: Self-pay | Admitting: Internal Medicine

## 2017-09-20 DIAGNOSIS — I2510 Atherosclerotic heart disease of native coronary artery without angina pectoris: Secondary | ICD-10-CM

## 2017-09-20 DIAGNOSIS — E7800 Pure hypercholesterolemia, unspecified: Secondary | ICD-10-CM

## 2017-09-20 DIAGNOSIS — K8000 Calculus of gallbladder with acute cholecystitis without obstruction: Principal | ICD-10-CM

## 2017-09-20 DIAGNOSIS — I10 Essential (primary) hypertension: Secondary | ICD-10-CM

## 2017-09-20 LAB — COMPREHENSIVE METABOLIC PANEL
ALBUMIN: 3.5 g/dL (ref 3.5–5.0)
ALK PHOS: 81 U/L (ref 38–126)
ALT: 45 U/L (ref 17–63)
ANION GAP: 10 (ref 5–15)
AST: 53 U/L — AB (ref 15–41)
BILIRUBIN TOTAL: 1.5 mg/dL — AB (ref 0.3–1.2)
BUN: 21 mg/dL — AB (ref 6–20)
CALCIUM: 8.4 mg/dL — AB (ref 8.9–10.3)
CO2: 25 mmol/L (ref 22–32)
Chloride: 103 mmol/L (ref 101–111)
Creatinine, Ser: 0.68 mg/dL (ref 0.61–1.24)
GFR calc Af Amer: >60 mL/min (ref >60)
GFR calc non Af Amer: >60 mL/min (ref >60)
GLUCOSE: 241 mg/dL — AB (ref 65–99)
POTASSIUM: 3.1 mmol/L — AB (ref 3.5–5.1)
SODIUM: 138 mmol/L (ref 135–145)
TOTAL PROTEIN: 7.1 g/dL (ref 6.5–8.1)

## 2017-09-20 LAB — HIV ANTIBODY (ROUTINE TESTING W REFLEX): HIV Screen 4th Generation wRfx: NONREACTIVE

## 2017-09-20 LAB — CBC
HEMATOCRIT: 39.9 % (ref 39.0–52.0)
HEMOGLOBIN: 13.7 g/dL (ref 13.0–17.0)
MCH: 29 pg (ref 26.0–34.0)
MCHC: 34.3 g/dL (ref 30.0–36.0)
MCV: 84.5 fL (ref 78.0–100.0)
Platelets: 155 10*3/uL (ref 150–400)
RBC: 4.72 MIL/uL (ref 4.22–5.81)
RDW: 13.2 % (ref 11.5–15.5)
WBC: 22.1 10*3/uL — ABNORMAL HIGH (ref 4.0–10.5)

## 2017-09-20 LAB — GLUCOSE, CAPILLARY
GLUCOSE-CAPILLARY: 253 mg/dL — AB (ref 65–99)
GLUCOSE-CAPILLARY: 198 mg/dL — AB (ref 65–99)
GLUCOSE-CAPILLARY: 164 mg/dL — AB (ref 65–99)
GLUCOSE-CAPILLARY: 164 mg/dL — AB (ref 65–99)
Glucose-Capillary: 226 mg/dL — ABNORMAL HIGH (ref 65–99)

## 2017-09-20 LAB — MRSA PCR SCREENING: MRSA BY PCR: NEGATIVE

## 2017-09-20 SURGERY — LAPAROSCOPIC CHOLECYSTECTOMY WITH INTRAOPERATIVE CHOLANGIOGRAM
Anesthesia: General

## 2017-09-20 MED ORDER — SODIUM CHLORIDE 0.9 % IV SOLN
INTRAVENOUS | Status: AC
  Administered 2017-09-20: 11:00:00 via INTRAVENOUS

## 2017-09-20 MED ORDER — ROCURONIUM BROMIDE 10 MG/ML (PF) SYRINGE
PREFILLED_SYRINGE | INTRAVENOUS | Status: AC
  Filled 2017-09-20: qty 5

## 2017-09-20 MED ORDER — MIDAZOLAM HCL 2 MG/2ML IJ SOLN
INTRAMUSCULAR | Status: AC
  Filled 2017-09-20: qty 2

## 2017-09-20 MED ORDER — INSULIN GLARGINE 100 UNIT/ML ~~LOC~~ SOLN: 10.0000 [IU] | Freq: Two times a day (BID) | SUBCUTANEOUS | Status: DC

## 2017-09-20 MED ORDER — PROPOFOL 10 MG/ML IV BOLUS
INTRAVENOUS | Status: AC
  Filled 2017-09-20: qty 20

## 2017-09-20 MED ORDER — BUPIVACAINE HCL (PF) 0.25 % IJ SOLN
INTRAMUSCULAR | Status: AC
  Filled 2017-09-20: qty 30

## 2017-09-20 MED ORDER — POTASSIUM CHLORIDE IN NACL 20-0.9 MEQ/L-% IV SOLN
INTRAVENOUS | Status: AC
Start: 2017-09-20 — End: 2017-09-21
  Administered 2017-09-20: 15:00:00 via INTRAVENOUS
  Administered 2017-09-21: 100 mL/h via INTRAVENOUS
  Filled 2017-09-20 (×3): qty 1000

## 2017-09-20 MED ORDER — INSULIN GLARGINE 100 UNIT/ML ~~LOC~~ SOLN
8.0000 [IU] | Freq: Two times a day (BID) | SUBCUTANEOUS | Status: DC
  Administered 2017-09-20 – 2017-09-22 (×5): 8 [IU] via SUBCUTANEOUS
  Filled 2017-09-20 (×5): qty 0.08

## 2017-09-20 MED ORDER — POTASSIUM CHLORIDE 10 MEQ/100ML IV SOLN
10.0000 meq | INTRAVENOUS | Status: AC
  Administered 2017-09-20 (×4): 10 meq via INTRAVENOUS
  Filled 2017-09-20 (×4): qty 100

## 2017-09-20 MED ORDER — IOPAMIDOL (ISOVUE-300) INJECTION 61%
INTRAVENOUS | Status: AC
  Filled 2017-09-20: qty 50

## 2017-09-20 MED ORDER — METOPROLOL TARTRATE 5 MG/5ML IV SOLN
5.0000 mg | INTRAVENOUS | Status: DC | PRN
  Administered 2017-09-23 – 2017-09-29 (×6): 5 mg via INTRAVENOUS
  Filled 2017-09-20 (×6): qty 5

## 2017-09-20 MED ORDER — FENTANYL CITRATE (PF) 250 MCG/5ML IJ SOLN
INTRAMUSCULAR | Status: AC
  Filled 2017-09-20: qty 5

## 2017-09-20 MED ORDER — LIDOCAINE-EPINEPHRINE 1 %-1:100000 IJ SOLN
INTRAMUSCULAR | Status: AC
  Filled 2017-09-20: qty 1

## 2017-09-20 MED ORDER — LIDOCAINE 2% (20 MG/ML) 5 ML SYRINGE
INTRAMUSCULAR | Status: AC
  Filled 2017-09-20: qty 5

## 2017-09-20 MED ORDER — LISINOPRIL 10 MG PO TABS
10.0000 mg | ORAL_TABLET | Freq: Every day | ORAL | Status: DC
  Administered 2017-09-20: 10 mg via ORAL

## 2017-09-20 NOTE — Progress Notes (Signed)
Patient presented for surgery and was very hyperglycemia at around 350.  Was also concern for new right bundle branch block on EKG.  Cardiology consult.  Discussed with cards who will evaluate.  Reposted surgery for tomorrow.

## 2017-09-20 NOTE — Progress Notes (Signed)
Day of Surgery   Subjective/Chief Complaint: Appreciate TRH evaluation - no need for further cardiac work-up prior to surgery Patient still has some RUQ tenderness No nausea or vomiting   Objective: Vital signs in last 24 hours: Temp:  [98.1 F (36.7 C)-98.9 F (37.2 C)] 98.4 F (36.9 C) (02/03 0636) Pulse Rate:  [78-99] 96 (02/03 0636) Resp:  [18] 18 (02/03 0636) BP: (147-181)/(78-112) 154/78 (02/03 0636) SpO2:  [85 %-96 %] 92 % (02/03 0636)    Intake/Output from previous day: 02/02 0701 - 02/03 0700 In: 2198.3 [P.O.:360; I.V.:1788.3; IV Piggyback:50] Out: -  Intake/Output this shift: Total I/O In: -  Out: 600 [Urine:600]  General appearance: alert, cooperative and no distress Resp: clear to auscultation bilaterally Cardio: regular rate and rhythm, S1, S2 normal, no murmur, click, rub or gallop GI: obese, soft, mildly tender in RUQ; no palpable masses  Lab Results:  Recent Labs    09/19/17 0158 09/20/17 0516  WBC 12.7* 22.1*  HGB 13.5 13.7  HCT 39.0 39.9  PLT 172 155   BMET Recent Labs    09/19/17 0158 09/20/17 0516  NA 138 138  K 3.2* 3.1*  CL 101 103  CO2 26 25  GLUCOSE 207* 241*  BUN 17 21*  CREATININE 0.70 0.68  CALCIUM 9.4 8.4*   PT/INR No results for input(s): LABPROT, INR in the last 72 hours. ABG No results for input(s): PHART, HCO3 in the last 72 hours.  Invalid input(s): PCO2, PO2  Studies/Results: Ct Abdomen Pelvis W Contrast  Result Date: 09/19/2017 CLINICAL DATA:  Abdominal distention. Nausea and vomiting. No bowel movement in 2 days. EXAM: CT ABDOMEN AND PELVIS WITH CONTRAST TECHNIQUE: Multidetector CT imaging of the abdomen and pelvis was performed using the standard protocol following bolus administration of intravenous contrast. CONTRAST:  115m ISOVUE-300 IOPAMIDOL (ISOVUE-300) INJECTION 61% COMPARISON:  Ultrasound right upper quadrant 09/19/2017. Abdominal series 09/19/2017. CT pelvis 05/05/2014 FINDINGS: Lower chest: Mild  dependent changes in the lung bases. Hepatobiliary: Gallbladder is distended. No wall thickening. No focal stones identified. No bile duct dilatation. No focal liver lesions. Pancreas: Unremarkable. No pancreatic ductal dilatation or surrounding inflammatory changes. Spleen: Normal in size without focal abnormality. Adrenals/Urinary Tract: Adrenal glands are unremarkable. Kidneys are normal, without renal calculi, focal lesion, or hydronephrosis. Bladder is unremarkable. Stomach/Bowel: Stomach, small bowel, and colon are mostly decompressed. Scattered stool within the colon. No wall thickening or inflammatory changes identified. Appendix is normal. Vascular/Lymphatic: Aortic atherosclerosis. No enlarged abdominal or pelvic lymph nodes. Reproductive: Prostate is unremarkable. Calcification of the vas deferens. Other: No free air or free fluid in the abdomen. Abdominal wall musculature appears intact. No mesenteric edema. Musculoskeletal: Mild degenerative changes in the spine and hips. No destructive bone lesions. IMPRESSION: 1. Distended gallbladder without wall thickening, stone, or infiltration. 2. Aortic atherosclerosis. 3. No bowel obstruction or inflammation. Electronically Signed   By: WLucienne CapersM.D.   On: 09/19/2017 06:19   UKoreaAbdomen Limited  Result Date: 09/19/2017 CLINICAL DATA:  Right upper quadrant pain, epigastric pain, and vomiting. EXAM: ULTRASOUND ABDOMEN LIMITED RIGHT UPPER QUADRANT COMPARISON:  None. FINDINGS: Gallbladder: Gallbladder is distended and filled with echogenic material consistent with sludge and/or small stones. No gallbladder wall thickening. Murphy's sign is negative. Common bile duct: Diameter: 4 mm, normal Liver: No focal lesion identified. Within normal limits in parenchymal echogenicity. Portal vein is patent on color Doppler imaging with normal direction of blood flow towards the liver. IMPRESSION: Distended gallbladder filled with echogenic material consistent with  sludge or small stones. No additional changes to suggest cholecystitis. Electronically Signed   By: Lucienne Capers M.D.   On: 09/19/2017 05:15   Dg Abdomen Acute W/chest  Result Date: 09/19/2017 CLINICAL DATA:  Upper abdominal pain and nausea. No bowel movement over 2 days. EXAM: DG ABDOMEN ACUTE W/ 1V CHEST COMPARISON:  Chest 05/05/2014 FINDINGS: Normal heart size and pulmonary vascularity. No focal airspace disease or consolidation in the lungs. No blunting of costophrenic angles. No pneumothorax. Mediastinal contours appear intact. Gas and stool throughout the colon. Hepatic flexure of the colon is focally no small or large bowel distention. No free intra-abdominal air. No abnormal air-fluid levels. No radiopaque stones. Calcification in the vas deferens. Displaced suggesting an enlarged gallbladder. IMPRESSION: No evidence of active pulmonary disease. Right upper quadrant soft tissues suggest an enlarged gallbladder. Nonobstructive bowel gas pattern. Electronically Signed   By: Lucienne Capers M.D.   On: 09/19/2017 03:40    Anti-infectives: Anti-infectives (From admission, onward)   Start     Dose/Rate Route Frequency Ordered Stop   09/19/17 1200  cefTRIAXone (ROCEPHIN) 2 g in dextrose 5 % 50 mL IVPB     2 g 100 mL/hr over 30 Minutes Intravenous Every 24 hours 09/19/17 1120        Assessment/Plan:  Acute calculus cholecystitis Plan laparoscopic cholecystectomy with possible cholangiogram today by Dr. Barry Dienes.  The surgical procedure has been discussed with the patient.  Potential risks, benefits, alternative treatments, and expected outcomes have been explained.  All of the patient's questions at this time have been answered.  The likelihood of reaching the patient's treatment goal is good.  The patient understand the proposed surgical procedure and wishes to proceed.   LOS: 1 day    Derek Lowery 09/20/2017

## 2017-09-20 NOTE — Consult Note (Addendum)
Cardiology Consultation:   Patient ID: Derek Lowery; [id number]; Nov 18, 1955   Admit date: 09/18/2017 Date of Consult: 09/20/2017  Primary Care Provider: Ladell Pier, MD Primary Cardiologist: Skeet Latch, MD (new)   Patient Profile:   Derek Lowery is a 62 y.o. male with a hx of CAD status post PCI hypertension, hyperlipidemia , who is being seen today for the evaluation of RBBB/cardiovascular risk assessment at the request of Dr.  Stark Klein.  History of Present Illness:   [name]  was admitted 09/19/17 with one week of abdominal pain, nausea, and vomiting.  He was found to have acute cholecystitis and plans were made for him to undergo laparoscopic cholecystectomy today.  However his blood glucose was over 300 and he was noted to have a new right bundle branch block.  Urinalysis revealed greater than 500 glucose and ketones.  Hemoglobin A1c 05/2017 was 7.6%.  His blood pressure has also been poorly-controlled.  His home lisinopril was held and he was started on amlodipine.  He denies chest pain but does report exertional dyspnea with extended walking.  He is able to walk up a flight of stairs without chest pain but does get short of breath.  His most strenuous activity is washing the dishes.  His exercise is limited by poor vision.  He notes lower extremity edema that improves somewhat with elevation of his legs.  He denies orthopnea or PND.    Derek Lowery had an NSTEMI 06/2011.  He underwent placement of a Promus drug-eluting stent in OM 2.  He also had 60% LAD disease.  Echo at that time revealed LVEF 60% and was otherwise unremarkable.  EKG in 2013 revealed incomplete right bundle branch block.  He has not seen a cardiologist since that time.    Past Medical History:  Diagnosis Date  . Arthritis   . CAD (coronary artery disease)    NSTEMI 06/2011:  LHC 07/18/11: Proximal diagonal 60%, distal LAD with a diabetic appearance and 60% stenosis, OM2 with an occluded superior  branch and an inferior branch with 90%, EF 55% with inferior hypokinesis.  PCI: Promus DES to the OM2 inferior branch.  This vessel provides collaterals to the superior branch which remained occluded.  Echocardiogram 07/18/11: EF 60%, normal wall motion.  . Cataract   . Essential hypertension, benign   . Glaucoma   . Hypercholesteremia   . Internal hemorrhoids   . Myocardial infarction ([place])   . Noncompliance   . Type 2 diabetes mellitus ([place]) 1990    Past Surgical History:  Procedure Laterality Date  . COLONOSCOPY  2000   Dr. Collene Mares  . EYE SURGERY    . IRRIGATION AND DEBRIDEMENT ABSCESS Left 05/05/2014   Procedure: IRRIGATION AND DEBRIDEMENT ABSCESS left buttock;  Surgeon: Michael Boston, MD;  Location: [facility];  Service: General;  Laterality: Left;  . LEFT HEART CATHETERIZATION WITH CORONARY ANGIOGRAM N/A 07/18/2011   Procedure: LEFT HEART CATHETERIZATION WITH CORONARY ANGIOGRAM;  Surgeon: Hillary Bow, MD;  Location: [facility] CATH LAB;  Service: Cardiovascular;  Laterality: N/A;  . PERCUTANEOUS CORONARY STENT INTERVENTION (PCI-S)  07/18/2011   Procedure: PERCUTANEOUS CORONARY STENT INTERVENTION (PCI-S);  Surgeon: Hillary Bow, MD;  Location: [facility] CATH LAB;  Service: Cardiovascular;;     Home Medications:  Prior to Admission medications   Medication Sig Start Date End Date Taking? Authorizing Provider  aspirin EC 81 MG tablet Take 1 tablet (81 mg total) by mouth daily. 03/25/16  Yes Maren Reamer, MD  atorvastatin (LIPITOR) 40 MG tablet TAKE 1 TABLET BY MOUTH EVERY DAY FOR CHOLESTEROL 03/05/17  Yes Ladell Pier, MD  dorzolamide (TRUSOPT) 2 % ophthalmic solution Place 1 drop into the right eye 2 (two) times daily.   Yes [provider]  glucose blood (ACCU-CHEK AVIVA PLUS) test strip Use as instructed for 3 times daily testing of blood sugar. E11.9 03/05/17  Yes Ladell Pier, MD  glucose monitoring kit (FREESTYLE) monitoring kit 1 each by Does not apply route as needed  for other. 05/10/14  Yes Hongalgi, Lenis Dickinson, MD  Insulin Glargine (LANTUS SOLOSTAR) 100 UNIT/ML Solostar Pen Inject 30 Units into the skin daily. 08/12/17  Yes Ladell Pier, MD  insulin lispro (HUMALOG KWIKPEN) 100 UNIT/ML KiwkPen Inject 0.03 mLs (3 Units total) into the skin 2 (two) times daily with a meal. Breakfast and supper, and pen needles 3/day 03/05/17  Yes Ladell Pier, MD  Insulin Pen Needle (BD PEN NEEDLE NANO U/F) 32G X 4 MM MISC Use as directed 03/05/17  Yes Ladell Pier, MD  lisinopril-hydrochlorothiazide (PRINZIDE,ZESTORETIC) 10-12.5 MG tablet Take 1 tablet by mouth daily. 03/05/17  Yes Ladell Pier, MD  Multiple Vitamins-Minerals (MULTIVITAMIN WITH MINERALS) tablet Take 1 tablet by mouth daily.   Yes [provider]  timolol (BETIMOL) 0.5 % ophthalmic solution Place 1 drop into the right eye 2 (two) times daily.   Yes [provider]    Inpatient Medications: Scheduled Meds: . amLODipine  5 mg Oral Daily  . atorvastatin  40 mg Oral q1800  . dorzolamide  1 drop Right Eye BID  . enoxaparin (LOVENOX) injection  40 mg Subcutaneous Q24H  . insulin aspart  0-9 Units Subcutaneous TID WC  . insulin glargine  8 Units Subcutaneous BID  . pantoprazole (PROTONIX) IV  40 mg Intravenous QHS  . timolol  1 drop Right Eye BID   Continuous Infusions: . sodium chloride 100 mL/hr at 09/20/17 1056  . 0.9 % NaCl with KCl 20 mEq / L    . cefTRIAXone (ROCEPHIN)  IV Stopped (09/20/17 1130)  . potassium chloride 10 mEq (09/20/17 1310)   PRN Meds: acetaminophen **OR** acetaminophen, diphenhydrAMINE **OR** diphenhydrAMINE, hydrALAZINE, metoprolol tartrate, morphine injection, ondansetron **OR** ondansetron (ZOFRAN) IV, oxyCODONE  Allergies:   No Known Allergies  Social History:   Social History   Socioeconomic History  . Marital status: Single    Spouse name: Not on file  . Number of children: Not on file  . Years of education: Not on file  . Highest  education level: Not on file  Social Needs  . Financial resource strain: Not on file  . Food insecurity - worry: Not on file  . Food insecurity - inability: Not on file  . Transportation needs - medical: Not on file  . Transportation needs - non-medical: Not on file  Occupational History  . Occupation: Theatre stage manager: [employer]  Tobacco Use  . Smoking status: Never Smoker  . Smokeless tobacco: Never Used  Substance and Sexual Activity  . Alcohol use: No  . Drug use: No  . Sexual activity: No  Other Topics Concern  . Not on file  Social History Narrative   Works at [employer]    Family History:    Family History  Problem Relation Age of Onset  . Coronary artery disease Father        Developed in his 5s  . Kidney disease Father   .  Diabetes Father   . Melanoma Father   . Rectal cancer Father   . Heart failure Mother   . Hypertension Mother   . Diabetes Sister   . Diabetes Brother   . Colon cancer Neg Hx   . Esophageal cancer Neg Hx   . Stomach cancer Neg Hx      ROS:  Please see the history of present illness.  All other ROS reviewed and negative.     Physical Exam/Data:   Vitals:   09/19/17 1542 09/19/17 2109 09/20/17 0232 09/20/17 0636  BP: (!) 167/93 (!) 176/94 (!) 181/94 (!) 154/78  Pulse: 79 84 86 96  Resp: 18 18 18 18   Temp: 98.9 F (37.2 C) 98.2 F (36.8 C) 98.1 F (36.7 C) 98.4 F (36.9 C)  TempSrc: Oral Oral Oral Oral  SpO2: 94% 96% 92% 92%    Intake/Output Summary (Last 24 hours) at 09/20/2017 1328 Last data filed at 09/20/2017 1000 Gross per 24 hour  Intake 2198.33 ml  Output 600 ml  Net 1598.33 ml   VS:  BP (!) 154/78 (BP Location: Left Arm)   Pulse 96   Temp 98.4 F (36.9 C) (Oral)   Resp 18   SpO2 92%  , BMI There is no height or weight on file to calculate BMI. GENERAL:  Ill-appearing HEENT: Pupils equal round and reactive, fundi not visualized, oral mucosa unremarkable NECK:  No jugular venous  distention, waveform within normal limits, carotid upstroke brisk and symmetric, no bruits LUNGS:  Clear to auscultation bilaterally HEART:  RRR.  PMI not displaced or sustained,S1 and S2 within normal limits, no S3, no S4, no clicks, no rubs, no murmurs ABD:  Flat, positive bowel sounds normal in frequency in pitch, no bruits, no rebound, no guarding, no midline pulsatile mass, no hepatomegaly, no splenomegaly EXT:  2 plus pulses throughout, 1+ pitting edema to above the ankles bilaterally, no cyanosis no clubbing SKIN:  No rashes no nodules NEURO:  Cranial nerves II through XII grossly intact, motor grossly intact throughout PSYCH:  Cognitively intact, oriented to person place and time   EKG:  The EKG was personally reviewed and demonstrates:   Sinus rhythm.  Rate 85 bpm.  Right bundle branch block.  Telemetry:  Telemetry was personally reviewed and demonstrates:  n/a  Relevant CV Studies:  Left heart catheterization 06/2011:  NSTEMI 06/2011:  LHC 07/18/11: Proximal diagonal 60%, distal LAD with a diabetic appearance and 60% stenosis, OM2 with an occluded superior branch and an inferior branch with 90%, EF 55% with inferior hypokinesis.  PCI: Promus DES to the OM2 inferior branch.  This vessel provides collaterals to the superior branch which remained occluded.  Echocardiogram 07/18/11: EF 60%, normal wall motion.     Laboratory Data:  Chemistry Recent Labs  Lab 09/19/17 0158 09/20/17 0516  NA 138 138  K 3.2* 3.1*  CL 101 103  CO2 26 25  GLUCOSE 207* 241*  BUN 17 21*  CREATININE 0.70 0.68  CALCIUM 9.4 8.4*  GFRNONAA >60 >60  GFRAA >60 >60  ANIONGAP 11 10    Recent Labs  Lab 09/19/17 0158 09/20/17 0516  PROT 7.7 7.1  ALBUMIN 4.1 3.5  AST 23 53*  ALT 18 45  ALKPHOS 70 81  BILITOT 1.1 1.5*   Hematology Recent Labs  Lab 09/19/17 0158 09/20/17 0516  WBC 12.7* 22.1*  RBC 4.64 4.72  HGB 13.5 13.7  HCT 39.0 39.9  MCV 84.1 84.5  MCH 29.1 29.0  MCHC  34.6 34.3    RDW 13.0 13.2  PLT 172 155   Cardiac EnzymesNo results for input(s): TROPONINI in the last 168 hours.  Recent Labs  Lab 09/19/17 0515  TROPIPOC 0.00    BNPNo results for input(s): BNP, PROBNP in the last 168 hours.  DDimer No results for input(s): DDIMER in the last 168 hours.  Radiology/Studies:  Ct Abdomen Pelvis W Contrast  Result Date: 09/19/2017 CLINICAL DATA:  Abdominal distention. Nausea and vomiting. No bowel movement in 2 days. EXAM: CT ABDOMEN AND PELVIS WITH CONTRAST TECHNIQUE: Multidetector CT imaging of the abdomen and pelvis was performed using the standard protocol following bolus administration of intravenous contrast. CONTRAST:  138m ISOVUE-300 IOPAMIDOL (ISOVUE-300) INJECTION 61% COMPARISON:  Ultrasound right upper quadrant 09/19/2017. Abdominal series 09/19/2017. CT pelvis 05/05/2014 FINDINGS: Lower chest: Mild dependent changes in the lung bases. Hepatobiliary: Gallbladder is distended. No wall thickening. No focal stones identified. No bile duct dilatation. No focal liver lesions. Pancreas: Unremarkable. No pancreatic ductal dilatation or surrounding inflammatory changes. Spleen: Normal in size without focal abnormality. Adrenals/Urinary Tract: Adrenal glands are unremarkable. Kidneys are normal, without renal calculi, focal lesion, or hydronephrosis. Bladder is unremarkable. Stomach/Bowel: Stomach, small bowel, and colon are mostly decompressed. Scattered stool within the colon. No wall thickening or inflammatory changes identified. Appendix is normal. Vascular/Lymphatic: Aortic atherosclerosis. No enlarged abdominal or pelvic lymph nodes. Reproductive: Prostate is unremarkable. Calcification of the vas deferens. Other: No free air or free fluid in the abdomen. Abdominal wall musculature appears intact. No mesenteric edema. Musculoskeletal: Mild degenerative changes in the spine and hips. No destructive bone lesions. IMPRESSION: 1. Distended gallbladder without wall thickening,  stone, or infiltration. 2. Aortic atherosclerosis. 3. No bowel obstruction or inflammation. Electronically Signed   By: WLucienne CapersM.D.   On: 09/19/2017 06:19   UKoreaAbdomen Limited  Result Date: 09/19/2017 CLINICAL DATA:  Right upper quadrant pain, epigastric pain, and vomiting. EXAM: ULTRASOUND ABDOMEN LIMITED RIGHT UPPER QUADRANT COMPARISON:  None. FINDINGS: Gallbladder: Gallbladder is distended and filled with echogenic material consistent with sludge and/or small stones. No gallbladder wall thickening. Murphy's sign is negative. Common bile duct: Diameter: 4 mm, normal Liver: No focal lesion identified. Within normal limits in parenchymal echogenicity. Portal vein is patent on color Doppler imaging with normal direction of blood flow towards the liver. IMPRESSION: Distended gallbladder filled with echogenic material consistent with sludge or small stones. No additional changes to suggest cholecystitis. Electronically Signed   By: WLucienne CapersM.D.   On: 09/19/2017 05:15   Dg Abdomen Acute W/chest  Result Date: 09/19/2017 CLINICAL DATA:  Upper abdominal pain and nausea. No bowel movement over 2 days. EXAM: DG ABDOMEN ACUTE W/ 1V CHEST COMPARISON:  Chest 05/05/2014 FINDINGS: Normal heart size and pulmonary vascularity. No focal airspace disease or consolidation in the lungs. No blunting of costophrenic angles. No pneumothorax. Mediastinal contours appear intact. Gas and stool throughout the colon. Hepatic flexure of the colon is focally no small or large bowel distention. No free intra-abdominal air. No abnormal air-fluid levels. No radiopaque stones. Calcification in the vas deferens. Displaced suggesting an enlarged gallbladder. IMPRESSION: No evidence of active pulmonary disease. Right upper quadrant soft tissues suggest an enlarged gallbladder. Nonobstructive bowel gas pattern. Electronically Signed   By: WLucienne CapersM.D.   On: 09/19/2017 03:40    Assessment and Plan:   # CAD: #  Pre-operative risk assessment: Mr. KBrallierunderwent PCI in 2012.  He denies angina, though he is not very physically active.  He is able to walk up a flight of stairs but unable to walk longer distances due to dyspnea.  The patient does not have any unstable cardiac conditions.  Upon evaluation today, he can achieve 4 METs or greater without anginal symptoms.  According to [facility] and AHA guidelines, he requires no further cardiac workup prior to his noncardiac surgery and should be at acceptable risk.  his NSQIP risk of peri-procedural MI or cardiac arrest is 0.9%.  We will get an echo to assess his dyspnea and mild volume overload.  Our service is available as necessary in the perioperative period.  He is not on aspirin.  Would restart aspirin 70m daily when OK per primary teams.  Continue atorvastatin.   # RBBB:  Not clinically significant.  He had an incomplete RBBB in 2013 and this is not associated with ischemia.    # Hypertension: Blood pressure poorly controlled.  Resume home lisinopril 123mdaily.  Continue amlodipine.  Goal is <130/80.    For questions or updates, please contact [facility] consult [email or web address] for contact info under Cardiology/STEMI.   Signed, TiSkeet LatchMD  09/20/2017 1:28 PM

## 2017-09-20 NOTE — Progress Notes (Signed)
Lantus ordered from pharmacy high priority; did not arrive before patient was called for OR. Ronalee Belts in PACU notified. Donne Hazel, RN

## 2017-09-20 NOTE — Progress Notes (Signed)
[place]        CONSULT PROGRESS NOTE                                                                                                                                                                                                             Patient Demographics:    Derek Lowery, is a 62 y.o. male, DOB - 01/12/1956, VQM:[id number]  Admit date - 09/18/2017   Admitting Physician Md Edison Pace, MD  Outpatient Primary MD for the patient is Ladell Pier, MD  LOS - 1  Chief Complaint  Patient presents with  . Abdominal Pain       Brief Narrative  62 y/o male with PMH of HTN, DM, CAD s/p stent (2012), presented with progressively worsening epigastric, RUQ abdominal pains, nausea, vomiting and being admitted for cholecystectomy.  Hospitalist team was consulted to manage diabetes mellitus.    Subjective:    Tomma Rakers today has, No headache, No chest pain, +ve RUQ abdominal pain - No Nausea, No new weakness tingling or numbness, No Cough - SOB.     Assessment  & Plan :     1.  Acute cholecystitis.  Currently on Rocephin, primary team and general surgery, defer management to general surgery.  Will be moderate risk for adverse cardiopulmonary outcome during the perioperative.,  History of CAD with stent in the past.  Currently no active chest pain or shortness of breath, no audible murmurs, no history suggestive of frank CHF, he can climb 2 flights of stairs on a good day without any chest symptoms.  EKG nonacute.  2.  DM type II in poor control.  History of diabetic retinopathy and peripheral neuropathy.  Placed on Lantus twice daily since he is n.p.o. after low-dose along with every 4 hours sliding scale.  Monitor CBGs.  If persistently over 250 will consider adding low-dose insulin drip.  Lab Results  Component Value Date   HGBA1C 7.6 06/09/2017   CBG (last 3)  Recent Labs    09/19/17 1721 09/20/17 0756  GLUCAP 240* 253*    3.  Hypokalemia.  Replaced IV and will continue to  monitor along with magnesium levels.  4.  CAD history.  No acute issues, continue home dose statin, resume aspirin after surgery for secondary prevention.  5.  Essential hypertension.  Currently on Norvasc along with as needed hydralazine IV Lopressor.  Will resume ACE inhibitor after his surgical procedure is done.  6.  Dyslipidemia.  Continue home dose statin.    Diet : Diet clear  liquid Room service appropriate? Yes    Family Communication  :  None  Code Status :  Full  Disposition Plan  :  TBD   DVT Prophylaxis  :  Lovenox    Lab Results  Component Value Date   PLT 155 09/20/2017    Inpatient Medications  Scheduled Meds: . amLODipine  5 mg Oral Daily  . atorvastatin  40 mg Oral q1800  . dorzolamide  1 drop Right Eye BID  . enoxaparin (LOVENOX) injection  40 mg Subcutaneous Q24H  . insulin aspart  0-9 Units Subcutaneous TID WC  . insulin glargine  8 Units Subcutaneous BID  . pantoprazole (PROTONIX) IV  40 mg Intravenous QHS  . timolol  1 drop Right Eye BID   Continuous Infusions: . sodium chloride    . 0.9 % NaCl with KCl 20 mEq / L    . cefTRIAXone (ROCEPHIN)  IV Stopped (09/19/17 1316)  . potassium chloride     PRN Meds:.acetaminophen **OR** acetaminophen, diphenhydrAMINE **OR** diphenhydrAMINE, hydrALAZINE, metoprolol tartrate, morphine injection, ondansetron **OR** ondansetron (ZOFRAN) IV, oxyCODONE  Antibiotics  :    Anti-infectives (From admission, onward)   Start     Dose/Rate Route Frequency Ordered Stop   09/19/17 1200  cefTRIAXone (ROCEPHIN) 2 g in dextrose 5 % 50 mL IVPB     2 g 100 mL/hr over 30 Minutes Intravenous Every 24 hours 09/19/17 1120           Objective:   Vitals:   09/19/17 1542 09/19/17 2109 09/20/17 0232 09/20/17 0636  BP: (!) 167/93 (!) 176/94 (!) 181/94 (!) 154/78  Pulse: 79 84 86 96  Resp: 18 18 18 18   Temp: 98.9 F (37.2 C) 98.2 F (36.8 C) 98.1 F (36.7 C) 98.4 F (36.9 C)  TempSrc: Oral Oral Oral Oral  SpO2: 94%  96% 92% 92%    Wt Readings from Last 3 Encounters:  07/13/17 102.5 kg (226 lb)  06/09/17 98.5 kg (217 lb 3.2 oz)  03/05/17 101.2 kg (223 lb 3.2 oz)     Intake/Output Summary (Last 24 hours) at 09/20/2017 1038 Last data filed at 09/20/2017 1000 Gross per 24 hour  Intake 2198.33 ml  Output 600 ml  Net 1598.33 ml     Physical Exam  Awake Alert, Oriented X 3, No new F.N deficits, Normal affect [place].AT,PERRAL Supple Neck,No JVD, No cervical lymphadenopathy appriciated.  Symmetrical Chest wall movement, Good air movement bilaterally, CTAB RRR,No Gallops,Rubs or new Murmurs, No Parasternal Heave +ve B.Sounds, Abd Soft, +ve RUQ tenderness, No organomegaly appriciated, No rebound - guarding or rigidity. No Cyanosis, Clubbing or edema, No new Rash or bruise      Data Review:    CBC Recent Labs  Lab 09/19/17 0158 09/20/17 0516  WBC 12.7* 22.1*  HGB 13.5 13.7  HCT 39.0 39.9  PLT 172 155  MCV 84.1 84.5  MCH 29.1 29.0  MCHC 34.6 34.3  RDW 13.0 13.2  LYMPHSABS 1.2  --   MONOABS 1.1*  --   EOSABS 0.0  --   BASOSABS 0.0  --     Chemistries  Recent Labs  Lab 09/19/17 0158 09/20/17 0516  NA 138 138  K 3.2* 3.1*  CL 101 103  CO2 26 25  GLUCOSE 207* 241*  BUN 17 21*  CREATININE 0.70 0.68  CALCIUM 9.4 8.4*  AST 23 53*  ALT 18 45  ALKPHOS 70 81  BILITOT 1.1 1.5*   ------------------------------------------------------------------------------------------------------------------ No results for input(s): CHOL, HDL, LDLCALC,  TRIG, CHOLHDL, LDLDIRECT in the last 72 hours.  Lab Results  Component Value Date   HGBA1C 7.6 06/09/2017   ------------------------------------------------------------------------------------------------------------------ No results for input(s): TSH, T4TOTAL, T3FREE, THYROIDAB in the last 72 hours.  Invalid input(s): FREET3 ------------------------------------------------------------------------------------------------------------------ No  results for input(s): VITAMINB12, FOLATE, FERRITIN, TIBC, IRON, RETICCTPCT in the last 72 hours.  Coagulation profile No results for input(s): INR, PROTIME in the last 168 hours.  No results for input(s): DDIMER in the last 72 hours.  Cardiac Enzymes No results for input(s): CKMB, TROPONINI, MYOGLOBIN in the last 168 hours.  Invalid input(s): CK ------------------------------------------------------------------------------------------------------------------ No results found for: BNP  Micro Results No results found for this or any previous visit (from the past 240 hour(s)).  Radiology Reports Ct Abdomen Pelvis W Contrast  Result Date: 09/19/2017 CLINICAL DATA:  Abdominal distention. Nausea and vomiting. No bowel movement in 2 days. EXAM: CT ABDOMEN AND PELVIS WITH CONTRAST TECHNIQUE: Multidetector CT imaging of the abdomen and pelvis was performed using the standard protocol following bolus administration of intravenous contrast. CONTRAST:  176m ISOVUE-300 IOPAMIDOL (ISOVUE-300) INJECTION 61% COMPARISON:  Ultrasound right upper quadrant 09/19/2017. Abdominal series 09/19/2017. CT pelvis 05/05/2014 FINDINGS: Lower chest: Mild dependent changes in the lung bases. Hepatobiliary: Gallbladder is distended. No wall thickening. No focal stones identified. No bile duct dilatation. No focal liver lesions. Pancreas: Unremarkable. No pancreatic ductal dilatation or surrounding inflammatory changes. Spleen: Normal in size without focal abnormality. Adrenals/Urinary Tract: Adrenal glands are unremarkable. Kidneys are normal, without renal calculi, focal lesion, or hydronephrosis. Bladder is unremarkable. Stomach/Bowel: Stomach, small bowel, and colon are mostly decompressed. Scattered stool within the colon. No wall thickening or inflammatory changes identified. Appendix is normal. Vascular/Lymphatic: Aortic atherosclerosis. No enlarged abdominal or pelvic lymph nodes. Reproductive: Prostate is unremarkable.  Calcification of the vas deferens. Other: No free air or free fluid in the abdomen. Abdominal wall musculature appears intact. No mesenteric edema. Musculoskeletal: Mild degenerative changes in the spine and hips. No destructive bone lesions. IMPRESSION: 1. Distended gallbladder without wall thickening, stone, or infiltration. 2. Aortic atherosclerosis. 3. No bowel obstruction or inflammation. Electronically Signed   By: WLucienne CapersM.D.   On: 09/19/2017 06:19   [place]domen Limited  Result Date: 09/19/2017 CLINICAL DATA:  Right upper quadrant pain, epigastric pain, and vomiting. EXAM: ULTRASOUND ABDOMEN LIMITED RIGHT UPPER QUADRANT COMPARISON:  None. FINDINGS: Gallbladder: Gallbladder is distended and filled with echogenic material consistent with sludge and/or small stones. No gallbladder wall thickening. Murphy's sign is negative. Common bile duct: Diameter: 4 mm, normal Liver: No focal lesion identified. Within normal limits in parenchymal echogenicity. Portal vein is patent on color Doppler imaging with normal direction of blood flow towards the liver. IMPRESSION: Distended gallbladder filled with echogenic material consistent with sludge or small stones. No additional changes to suggest cholecystitis. Electronically Signed   By: WLucienne CapersM.D.   On: 09/19/2017 05:15   Dg Abdomen Acute W/chest  Result Date: 09/19/2017 CLINICAL DATA:  Upper abdominal pain and nausea. No bowel movement over 2 days. EXAM: DG ABDOMEN ACUTE W/ 1V CHEST COMPARISON:  Chest 05/05/2014 FINDINGS: Normal heart size and pulmonary vascularity. No focal airspace disease or consolidation in the lungs. No blunting of costophrenic angles. No pneumothorax. Mediastinal contours appear intact. Gas and stool throughout the colon. Hepatic flexure of the colon is focally no small or large bowel distention. No free intra-abdominal air. No abnormal air-fluid levels. No radiopaque stones. Calcification in the vas deferens. Displaced  suggesting an enlarged gallbladder. IMPRESSION: No evidence  of active pulmonary disease. Right upper quadrant soft tissues suggest an enlarged gallbladder. Nonobstructive bowel gas pattern. Electronically Signed   By: Lucienne Capers M.D.   On: 09/19/2017 03:40    Time Spent in minutes  30   Lala Lund M.D on 09/20/2017 at 10:38 AM  Between 7am to 7pm - Pager - [phone] ( page via [email or web address], text pages only, please mention full 10 digit call back number). After 7pm go to [email or web address] - password [email or web address]

## 2017-09-20 NOTE — Progress Notes (Signed)
Anesthesia Preop Note Patient with poorly controlled diabetes. Blood glc. has remained high since admission and he was not given morning meds. Blood glc 345 in preop. I discussed with Dr. Georgette [place] and Dr. Barry Dienes. They do not feel the surgical urgency outweigh his medical issues and agree with delaying his operation allowing for proper medical optimization. He may need to be on an insulin infusion, fluid resuscitation and will also likely need potassium replacement in order to properly prepare him for surgery.

## 2017-09-20 NOTE — Progress Notes (Signed)
BP 181/94, PRN Hydralazine 5 mg administered.  Will monitor.

## 2017-09-20 NOTE — Progress Notes (Signed)
Dr. Barry Dienes said ok to hold lovenox tonight as unsure of surgery on tomorrow.  Pt getting echocardiogram on tomorrow.

## 2017-09-20 NOTE — Anesthesia Preprocedure Evaluation (Deleted)
Anesthesia Evaluation  Patient identified by MRN, date of birth, ID band Patient awake    Reviewed: Allergy & Precautions, H&P , NPO status , Patient's Chart, lab work & pertinent test results  Airway Mallampati: II  TM Distance: >3 FB Neck ROM: Full    Dental no notable dental hx.    Pulmonary neg pulmonary ROS,    Pulmonary exam normal breath sounds clear to auscultation       Cardiovascular Exercise Tolerance: Poor hypertension, Pt. on medications + CAD, + Past MI and + Cardiac Stents  Normal cardiovascular exam Rhythm:Regular Rate:Normal     Neuro/Psych  Neuromuscular disease negative psych ROS   GI/Hepatic negative GI ROS, Neg liver ROS,   Endo/Other  diabetes, Poorly Controlled, Type 2  Renal/GU negative Renal ROS     Musculoskeletal negative musculoskeletal ROS (+) Arthritis ,   Abdominal   Peds  Hematology negative hematology ROS (+)   Anesthesia Other Findings   Reproductive/Obstetrics                             Anesthesia Physical  Anesthesia Plan  ASA: III  Anesthesia Plan: General   Post-op Pain Management:    Induction: Intravenous and Rapid sequence  PONV Risk Score and Plan: 4 or greater and Ondansetron, Dexamethasone, Midazolam and Treatment may vary due to age or medical condition  Airway Management Planned: Oral ETT  Additional Equipment:   Intra-op Plan:   Post-operative Plan: Extubation in OR  Informed Consent: I have reviewed the patients History and Physical, chart, labs and discussed the procedure including the risks, benefits and alternatives for the proposed anesthesia with the patient or authorized representative who has indicated his/her understanding and acceptance.   Dental advisory given  Plan Discussed with: CRNA  Anesthesia Plan Comments: (Had long discussion with patient regarding his unmanaged diabetes and CV disease. In light of his  known multivessel CAD and prior DES placement in 2012 ago, and no follow up since 12/2011 I consider pt at high risk for periop MI. The patient expresses understanding.)       Anesthesia Quick Evaluation

## 2017-09-21 ENCOUNTER — Inpatient Hospital Stay (HOSPITAL_COMMUNITY): Payer: Medicare Other

## 2017-09-21 ENCOUNTER — Other Ambulatory Visit (HOSPITAL_COMMUNITY): Payer: Medicare Other

## 2017-09-21 ENCOUNTER — Inpatient Hospital Stay (HOSPITAL_COMMUNITY): Payer: Medicare Other | Admitting: Anesthesiology

## 2017-09-21 ENCOUNTER — Encounter (HOSPITAL_COMMUNITY): Payer: Self-pay | Admitting: Anesthesiology

## 2017-09-21 ENCOUNTER — Encounter (HOSPITAL_COMMUNITY)
Admission: EM | Disposition: A | Discharge status: Home / Self Care | Payer: Self-pay | Source: Home / Self Care | Attending: Internal Medicine

## 2017-09-21 DIAGNOSIS — I361 Nonrheumatic tricuspid (valve) insufficiency: Secondary | ICD-10-CM

## 2017-09-21 HISTORY — PX: CHOLECYSTECTOMY: SHX55

## 2017-09-21 LAB — ECHOCARDIOGRAM COMPLETE
AOASC: 30 cm
CHL CUP MV DEC (S): 236
E decel time: 236 msec
EERAT: 15.1
FS: 33 % (ref 28–44)
IVS/LV PW RATIO, ED: 1.06
LA ID, A-P, ES: 42 mm
LA diam end sys: 42 mm
LA vol A4C: 50.2 ml
LA vol index: 23.6 mL/m2
LADIAMINDEX: 1.84 cm/m2
LAVOL: 53.8 mL
LDCA: 4.52 cm2
LV E/e' medial: 15.1
LV E/e'average: 15.1
LV PW d: 10.1 mm — AB (ref 0.6–1.1)
LVELAT: 5.33 cm/s
LVOT diameter: 24 mm
MV Peak grad: 3 mmHg
MVPKAVEL: 113 m/s
MVPKEVEL: 80.5 m/s
RV LATERAL S' VELOCITY: 11.4 cm/s
RV TAPSE: 16.3 mm
TDI e' lateral: 5.33
TDI e' medial: 6.96

## 2017-09-21 LAB — GLUCOSE, CAPILLARY
GLUCOSE-CAPILLARY: 196 mg/dL — AB (ref 65–99)
GLUCOSE-CAPILLARY: 345 mg/dL — AB (ref 65–99)
GLUCOSE-CAPILLARY: 161 mg/dL — AB (ref 65–99)
GLUCOSE-CAPILLARY: 125 mg/dL — AB (ref 65–99)
GLUCOSE-CAPILLARY: 153 mg/dL — AB (ref 65–99)
GLUCOSE-CAPILLARY: 170 mg/dL — AB (ref 65–99)
Glucose-Capillary: 142 mg/dL — ABNORMAL HIGH (ref 65–99)
Glucose-Capillary: 132 mg/dL — ABNORMAL HIGH (ref 65–99)

## 2017-09-21 LAB — COMPREHENSIVE METABOLIC PANEL
ALBUMIN: 2.8 g/dL — AB (ref 3.5–5.0)
ALK PHOS: 84 U/L (ref 38–126)
ALT: 171 U/L — AB (ref 17–63)
AST: 181 U/L — ABNORMAL HIGH (ref 15–41)
Anion gap: 7 (ref 5–15)
BUN: 39 mg/dL — AB (ref 6–20)
CALCIUM: 7.8 mg/dL — AB (ref 8.9–10.3)
CO2: 26 mmol/L (ref 22–32)
CREATININE: 1.46 mg/dL — AB (ref 0.61–1.24)
Chloride: 106 mmol/L (ref 101–111)
GFR calc Af Amer: 59 mL/min — ABNORMAL LOW (ref >60)
GFR, EST NON AFRICAN AMERICAN: 51 mL/min — AB (ref >60)
GLUCOSE: 169 mg/dL — AB (ref 65–99)
POTASSIUM: 3.9 mmol/L (ref 3.5–5.1)
Sodium: 139 mmol/L (ref 135–145)
TOTAL PROTEIN: 6.3 g/dL — AB (ref 6.5–8.1)
Total Bilirubin: 1.3 mg/dL — ABNORMAL HIGH (ref 0.3–1.2)

## 2017-09-21 LAB — CBC
HCT: 38 % — ABNORMAL LOW (ref 39.0–52.0)
HEMOGLOBIN: 12.6 g/dL — AB (ref 13.0–17.0)
MCH: 28.8 pg (ref 26.0–34.0)
MCHC: 33.2 g/dL (ref 30.0–36.0)
MCV: 87 fL (ref 78.0–100.0)
Platelets: 162 10*3/uL (ref 150–400)
RBC: 4.37 MIL/uL (ref 4.22–5.81)
RDW: 13.6 % (ref 11.5–15.5)
WBC: 23 10*3/uL — ABNORMAL HIGH (ref 4.0–10.5)

## 2017-09-21 LAB — MAGNESIUM: MAGNESIUM: 1.9 mg/dL (ref 1.7–2.4)

## 2017-09-21 LAB — URINALYSIS, ROUTINE W REFLEX MICROSCOPIC
BACTERIA UA: NONE SEEN
BILIRUBIN URINE: NEGATIVE
Glucose, UA: >=500 mg/dL — AB
HGB URINE DIPSTICK: NEGATIVE
KETONES UR: 5 mg/dL — AB
Leukocytes, UA: NEGATIVE
Nitrite: NEGATIVE
PROTEIN: 100 mg/dL — AB
Specific Gravity, Urine: 1.02 (ref 1.005–1.030)
pH: 5 (ref 5.0–8.0)

## 2017-09-21 LAB — CREATININE, URINE, RANDOM: Creatinine, Urine: 144.56 mg/dL

## 2017-09-21 LAB — BRAIN NATRIURETIC PEPTIDE: B NATRIURETIC PEPTIDE 5: 80.1 pg/mL (ref 0.0–100.0)

## 2017-09-21 LAB — OSMOLALITY: Osmolality: 298 mOsm/kg — ABNORMAL HIGH (ref 275–295)

## 2017-09-21 LAB — SODIUM, URINE, RANDOM: SODIUM UR: 77 mmol/L

## 2017-09-21 SURGERY — LAPAROSCOPIC CHOLECYSTECTOMY WITH INTRAOPERATIVE CHOLANGIOGRAM
Anesthesia: General

## 2017-09-21 MED ORDER — KETOROLAC TROMETHAMINE 30 MG/ML IJ SOLN: 30.0000 mg | Freq: Once | INTRAMUSCULAR | Status: DC | PRN

## 2017-09-21 MED ORDER — GENTAMICIN SULFATE 40 MG/ML IJ SOLN
INTRAMUSCULAR | Status: AC
  Administered 2017-09-21: 15:00:00 via INTRAPERITONEAL
  Filled 2017-09-21: qty 6

## 2017-09-21 MED ORDER — PROPOFOL 10 MG/ML IV BOLUS
INTRAVENOUS | Status: DC | PRN
  Administered 2017-09-21: 160 mg via INTRAVENOUS

## 2017-09-21 MED ORDER — HYDROMORPHONE HCL 1 MG/ML IJ SOLN: 0.2500 mg | INTRAMUSCULAR | Status: DC | PRN

## 2017-09-21 MED ORDER — PIPERACILLIN-TAZOBACTAM 3.375 G IVPB
3.3750 g | Freq: Three times a day (TID) | INTRAVENOUS | Status: DC
Start: 2017-09-21 — End: 2017-09-27
  Administered 2017-09-21 – 2017-09-27 (×18): 3.375 g via INTRAVENOUS
  Filled 2017-09-21 (×18): qty 50

## 2017-09-21 MED ORDER — METHOCARBAMOL 500 MG PO TABS: 1000.0000 mg | ORAL_TABLET | Freq: Four times a day (QID) | ORAL | Status: DC | PRN

## 2017-09-21 MED ORDER — SUCCINYLCHOLINE CHLORIDE 200 MG/10ML IV SOSY
PREFILLED_SYRINGE | INTRAVENOUS | Status: DC | PRN
  Administered 2017-09-21: 160 mg via INTRAVENOUS

## 2017-09-21 MED ORDER — PHENYLEPHRINE 40 MCG/ML (10ML) SYRINGE FOR IV PUSH (FOR BLOOD PRESSURE SUPPORT)
PREFILLED_SYRINGE | INTRAVENOUS | Status: AC
  Filled 2017-09-21: qty 10

## 2017-09-21 MED ORDER — DEXTROSE 5 % IV SOLN
INTRAVENOUS | Status: DC
  Administered 2017-09-21: 20:00:00 via INTRAVENOUS

## 2017-09-21 MED ORDER — GUAIFENESIN-DM 100-10 MG/5ML PO SYRP: 10.0000 mL | ORAL_SOLUTION | ORAL | Status: DC | PRN

## 2017-09-21 MED ORDER — PHENYLEPHRINE HCL 10 MG/ML IJ SOLN
INTRAMUSCULAR | Status: AC
  Filled 2017-09-21: qty 1

## 2017-09-21 MED ORDER — ACETAMINOPHEN 500 MG PO TABS
1000.0000 mg | ORAL_TABLET | Freq: Three times a day (TID) | ORAL | Status: DC
  Administered 2017-09-21 – 2017-10-01 (×27): 1000 mg via ORAL
  Filled 2017-09-21 (×28): qty 2

## 2017-09-21 MED ORDER — MIDAZOLAM HCL 5 MG/5ML IJ SOLN
INTRAMUSCULAR | Status: DC | PRN
  Administered 2017-09-21 (×2): 1 mg via INTRAVENOUS

## 2017-09-21 MED ORDER — MENTHOL 3 MG MT LOZG: 1.0000 | LOZENGE | OROMUCOSAL | Status: DC | PRN

## 2017-09-21 MED ORDER — ROCURONIUM BROMIDE 10 MG/ML (PF) SYRINGE
PREFILLED_SYRINGE | INTRAVENOUS | Status: AC
  Filled 2017-09-21: qty 5

## 2017-09-21 MED ORDER — LACTATED RINGERS IV SOLN
INTRAVENOUS | Status: DC | PRN
  Administered 2017-09-21 (×2): via INTRAVENOUS

## 2017-09-21 MED ORDER — PSYLLIUM 95 % PO PACK
1.0000 | PACK | Freq: Two times a day (BID) | ORAL | Status: DC
  Administered 2017-09-21 – 2017-09-23 (×4): 1 via ORAL
  Filled 2017-09-21 (×4): qty 1

## 2017-09-21 MED ORDER — POTASSIUM CHLORIDE IN NACL 20-0.9 MEQ/L-% IV SOLN
INTRAVENOUS | Status: DC
  Filled 2017-09-21: qty 1000

## 2017-09-21 MED ORDER — PHENYLEPHRINE 40 MCG/ML (10ML) SYRINGE FOR IV PUSH (FOR BLOOD PRESSURE SUPPORT)
PREFILLED_SYRINGE | INTRAVENOUS | Status: DC | PRN
  Administered 2017-09-21: 80 ug via INTRAVENOUS
  Administered 2017-09-21: 25 ug via INTRAVENOUS
  Administered 2017-09-21 (×4): 80 ug via INTRAVENOUS

## 2017-09-21 MED ORDER — HYDROCORTISONE 1 % EX CREA: 1.0000 "application " | TOPICAL_CREAM | Freq: Three times a day (TID) | CUTANEOUS | Status: DC | PRN

## 2017-09-21 MED ORDER — PROPOFOL 10 MG/ML IV BOLUS
INTRAVENOUS | Status: AC
  Filled 2017-09-21: qty 20

## 2017-09-21 MED ORDER — MAGIC MOUTHWASH
15.0000 mL | Freq: Four times a day (QID) | ORAL | Status: DC | PRN
  Filled 2017-09-21: qty 15

## 2017-09-21 MED ORDER — ALUM & MAG HYDROXIDE-SIMETH 200-200-20 MG/5ML PO SUSP
30.0000 mL | Freq: Four times a day (QID) | ORAL | Status: DC | PRN
  Administered 2017-09-26 – 2017-09-28 (×2): 30 mL via ORAL
  Filled 2017-09-21 (×2): qty 30

## 2017-09-21 MED ORDER — EPHEDRINE 5 MG/ML INJ
INTRAVENOUS | Status: AC
  Filled 2017-09-21: qty 10

## 2017-09-21 MED ORDER — METOCLOPRAMIDE HCL 5 MG/ML IJ SOLN: 10.0000 mg | Freq: Four times a day (QID) | INTRAMUSCULAR | Status: DC | PRN

## 2017-09-21 MED ORDER — PROMETHAZINE HCL 25 MG/ML IJ SOLN: 6.2500 mg | INTRAMUSCULAR | Status: DC | PRN

## 2017-09-21 MED ORDER — SUGAMMADEX SODIUM 500 MG/5ML IV SOLN
INTRAVENOUS | Status: AC
  Filled 2017-09-21: qty 5

## 2017-09-21 MED ORDER — METHOCARBAMOL 1000 MG/10ML IJ SOLN: 1000.0000 mg | Freq: Four times a day (QID) | INTRAVENOUS | Status: DC | PRN

## 2017-09-21 MED ORDER — METOCLOPRAMIDE HCL 10 MG PO TABS: 10.0000 mg | ORAL_TABLET | Freq: Four times a day (QID) | ORAL | Status: DC | PRN

## 2017-09-21 MED ORDER — EPHEDRINE SULFATE-NACL 50-0.9 MG/10ML-% IV SOSY
PREFILLED_SYRINGE | INTRAVENOUS | Status: DC | PRN
  Administered 2017-09-21: 10 mg via INTRAVENOUS

## 2017-09-21 MED ORDER — LACTATED RINGERS IR SOLN
Status: DC | PRN
  Administered 2017-09-21: 1000 mL

## 2017-09-21 MED ORDER — BISACODYL 10 MG RE SUPP: 10.0000 mg | Freq: Two times a day (BID) | RECTAL | Status: DC | PRN

## 2017-09-21 MED ORDER — GLUCERNA SHAKE PO LIQD
237.0000 mL | Freq: Two times a day (BID) | ORAL | Status: DC
Start: 2017-09-22 — End: 2017-10-01
  Administered 2017-09-22 – 2017-10-01 (×10): 237 mL via ORAL
  Filled 2017-09-21 (×20): qty 237

## 2017-09-21 MED ORDER — MIDAZOLAM HCL 2 MG/2ML IJ SOLN
INTRAMUSCULAR | Status: AC
  Filled 2017-09-21: qty 2

## 2017-09-21 MED ORDER — LACTATED RINGERS IV SOLN
INTRAVENOUS | Status: DC
  Administered 2017-09-21: 13:00:00 via INTRAVENOUS

## 2017-09-21 MED ORDER — GABAPENTIN 300 MG PO CAPS
300.0000 mg | ORAL_CAPSULE | Freq: Two times a day (BID) | ORAL | Status: DC
  Administered 2017-09-21 – 2017-09-25 (×6): 300 mg via ORAL
  Filled 2017-09-21 (×8): qty 1

## 2017-09-21 MED ORDER — DEXTROSE-NACL 5-0.45 % IV SOLN: INTRAVENOUS | Status: DC

## 2017-09-21 MED ORDER — LACTATED RINGERS IV BOLUS (SEPSIS): 1000.0000 mL | Freq: Three times a day (TID) | INTRAVENOUS | Status: DC | PRN

## 2017-09-21 MED ORDER — METHOCARBAMOL 1000 MG/10ML IJ SOLN
1000.0000 mg | Freq: Four times a day (QID) | INTRAVENOUS | Status: DC | PRN
  Filled 2017-09-21: qty 10

## 2017-09-21 MED ORDER — PHENOL 1.4 % MT LIQD: 1.0000 | OROMUCOSAL | Status: DC | PRN

## 2017-09-21 MED ORDER — SUGAMMADEX SODIUM 500 MG/5ML IV SOLN
INTRAVENOUS | Status: DC | PRN
  Administered 2017-09-21: 250 mg via INTRAVENOUS

## 2017-09-21 MED ORDER — SUCCINYLCHOLINE CHLORIDE 200 MG/10ML IV SOSY
PREFILLED_SYRINGE | INTRAVENOUS | Status: AC
  Filled 2017-09-21: qty 10

## 2017-09-21 MED ORDER — PROCHLORPERAZINE EDISYLATE 5 MG/ML IJ SOLN: 5.0000 mg | INTRAMUSCULAR | Status: DC | PRN

## 2017-09-21 MED ORDER — FENTANYL CITRATE (PF) 100 MCG/2ML IJ SOLN
INTRAMUSCULAR | Status: AC
  Filled 2017-09-21: qty 2

## 2017-09-21 MED ORDER — LIDOCAINE 2% (20 MG/ML) 5 ML SYRINGE
INTRAMUSCULAR | Status: AC
  Filled 2017-09-21: qty 5

## 2017-09-21 MED ORDER — MEPERIDINE HCL 50 MG/ML IJ SOLN: 6.2500 mg | INTRAMUSCULAR | Status: DC | PRN

## 2017-09-21 MED ORDER — ROCURONIUM BROMIDE 10 MG/ML (PF) SYRINGE
PREFILLED_SYRINGE | INTRAVENOUS | Status: DC | PRN
  Administered 2017-09-21: 30 mg via INTRAVENOUS
  Administered 2017-09-21: 10 mg via INTRAVENOUS

## 2017-09-21 MED ORDER — PHENYLEPHRINE HCL 10 MG/ML IJ SOLN
INTRAVENOUS | Status: DC | PRN
  Administered 2017-09-21: 25 ug/min via INTRAVENOUS

## 2017-09-21 MED ORDER — IOPAMIDOL (ISOVUE-300) INJECTION 61%
INTRAVENOUS | Status: AC
  Filled 2017-09-21: qty 50

## 2017-09-21 MED ORDER — FENTANYL CITRATE (PF) 100 MCG/2ML IJ SOLN
INTRAMUSCULAR | Status: DC | PRN
  Administered 2017-09-21 (×2): 50 ug via INTRAVENOUS

## 2017-09-21 MED ORDER — BUPIVACAINE-EPINEPHRINE 0.25% -1:200000 IJ SOLN
INTRAMUSCULAR | Status: AC
  Filled 2017-09-21: qty 1

## 2017-09-21 MED ORDER — HYDROCORTISONE 2.5 % RE CREA: 1.0000 "application " | TOPICAL_CREAM | Freq: Four times a day (QID) | RECTAL | Status: DC | PRN

## 2017-09-21 MED ORDER — DEXAMETHASONE SODIUM PHOSPHATE 10 MG/ML IJ SOLN
INTRAMUSCULAR | Status: DC | PRN
  Administered 2017-09-21: 8 mg via INTRAVENOUS

## 2017-09-21 MED ORDER — AMOXICILLIN-POT CLAVULANATE 875-125 MG PO TABS: 1.0000 | ORAL_TABLET | Freq: Two times a day (BID) | ORAL | 1 refills | Status: DC

## 2017-09-21 MED ORDER — LIP MEDEX EX OINT
1.0000 "application " | TOPICAL_OINTMENT | Freq: Two times a day (BID) | CUTANEOUS | Status: DC
  Administered 2017-09-21 – 2017-10-01 (×18): 1 via TOPICAL
  Filled 2017-09-21 (×4): qty 7

## 2017-09-21 MED ORDER — BUPIVACAINE-EPINEPHRINE 0.25% -1:200000 IJ SOLN
INTRAMUSCULAR | Status: DC | PRN
  Administered 2017-09-21: 50 mL

## 2017-09-21 MED ORDER — IOPAMIDOL (ISOVUE-300) INJECTION 61%
INTRAVENOUS | Status: DC | PRN
  Administered 2017-09-21: 50 mL via INTRAVENOUS

## 2017-09-21 MED ORDER — DEXTROSE 5 % IV SOLN: INTRAVENOUS | Status: DC

## 2017-09-21 MED ORDER — LIDOCAINE 2% (20 MG/ML) 5 ML SYRINGE
INTRAMUSCULAR | Status: DC | PRN
  Administered 2017-09-21: 100 mg via INTRAVENOUS

## 2017-09-21 SURGICAL SUPPLY — 46 items
APPLIER CLIP 5 13 M/L LIGAMAX5 (MISCELLANEOUS)
APPLIER CLIP ROT 10 11.4 M/L (STAPLE)
APR CLP MED LRG 11.4X10 (STAPLE)
APR CLP MED LRG 5 ANG JAW (MISCELLANEOUS)
BAG SPEC RTRVL 10 TROC 200 (ENDOMECHANICALS) ×1
BAG SPEC RTRVL LRG 6X4 10 (ENDOMECHANICALS)
CABLE HIGH FREQUENCY MONO STRZ (ELECTRODE) IMPLANT
CLIP APPLIE 5 13 M/L LIGAMAX5 (MISCELLANEOUS) IMPLANT
CLIP APPLIE ROT 10 11.4 M/L (STAPLE) IMPLANT
COVER MAYO STAND STRL (DRAPES) ×2 IMPLANT
COVER SURGICAL LIGHT HANDLE (MISCELLANEOUS) ×2 IMPLANT
DECANTER SPIKE VIAL GLASS SM (MISCELLANEOUS) ×2 IMPLANT
DRAIN CHANNEL 19F RND (DRAIN) ×1 IMPLANT
DRAPE C-ARM 42X120 X-RAY (DRAPES) ×2 IMPLANT
DRAPE UTILITY XL STRL (DRAPES) ×2 IMPLANT
DRAPE WARM FLUID 44X44 (DRAPE) ×2 IMPLANT
DRSG TEGADERM 2-3/8X2-3/4 SM (GAUZE/BANDAGES/DRESSINGS) ×4 IMPLANT
DRSG TEGADERM 4X4.75 (GAUZE/BANDAGES/DRESSINGS) ×2 IMPLANT
ELECT REM PT RETURN 15FT ADLT (MISCELLANEOUS) ×2 IMPLANT
ENDOLOOP SUT PDS II  0 18 (SUTURE) ×1
ENDOLOOP SUT PDS II 0 18 (SUTURE) IMPLANT
EVACUATOR SILICONE 100CC (DRAIN) ×1 IMPLANT
GAUZE SPONGE 2X2 8PLY STRL LF (GAUZE/BANDAGES/DRESSINGS) IMPLANT
GLOVE ECLIPSE 8.0 STRL XLNG CF (GLOVE) ×2 IMPLANT
GLOVE INDICATOR 8.0 STRL GRN (GLOVE) ×2 IMPLANT
GOWN STRL REUS W/TWL XL LVL3 (GOWN DISPOSABLE) ×4 IMPLANT
IRRIG SUCT STRYKERFLOW 2 WTIP (MISCELLANEOUS) ×2
IRRIGATION SUCT STRKRFLW 2 WTP (MISCELLANEOUS) ×1 IMPLANT
KIT BASIN OR (CUSTOM PROCEDURE TRAY) ×2 IMPLANT
POSITIONER SURGICAL ARM (MISCELLANEOUS) IMPLANT
POUCH RETRIEVAL ECOSAC 10 (ENDOMECHANICALS) IMPLANT
POUCH RETRIEVAL ECOSAC 10MM (ENDOMECHANICALS) ×1
POUCH SPECIMEN RETRIEVAL 10MM (ENDOMECHANICALS) IMPLANT
SCISSORS METZENBAUM CVD 33 (INSTRUMENTS) ×2 IMPLANT
SET CHOLANGIOGRAPH MIX (MISCELLANEOUS) ×2 IMPLANT
SLEEVE XCEL OPT CAN 5 100 (ENDOMECHANICALS) ×1 IMPLANT
SPONGE GAUZE 2X2 STER 10/PKG (GAUZE/BANDAGES/DRESSINGS) ×1
SUT MNCRL AB 4-0 PS2 18 (SUTURE) ×2 IMPLANT
SUT PROLENE 2 0 CT2 30 (SUTURE) ×1 IMPLANT
SYR 20CC LL (SYRINGE) ×2 IMPLANT
TOWEL OR 17X26 10 PK STRL BLUE (TOWEL DISPOSABLE) ×2 IMPLANT
TOWEL OR NON WOVEN STRL DISP B (DISPOSABLE) ×2 IMPLANT
TRAY LAPAROSCOPIC (CUSTOM PROCEDURE TRAY) ×2 IMPLANT
TROCAR BLADELESS OPT 5 100 (ENDOMECHANICALS) ×2 IMPLANT
TROCAR XCEL NON-BLD 11X100MML (ENDOMECHANICALS) ×2 IMPLANT
TUBING INSUF HEATED (TUBING) ×2 IMPLANT

## 2017-09-21 NOTE — Progress Notes (Addendum)
[place]        CONSULT PROGRESS NOTE                                                                                                                                                                                                             Patient Demographics:    Derek Lowery, is a 62 y.o. male, DOB - [birth year], KKX:[id number]  Admit date - 09/18/2017   Admitting Physician Md Edison Pace, MD  Outpatient Primary MD for the patient is Ladell Pier, MD  LOS - 2  Chief Complaint  Patient presents with  . Abdominal Pain       Brief Narrative  62 y/o male with PMH of HTN, DM, CAD s/p stent (2012), presented with progressively worsening epigastric, RUQ abdominal pains, nausea, vomiting and being admitted for cholecystectomy.  Hospitalist team was consulted to manage diabetes mellitus.    Subjective:   Patient in bed, appears comfortable, denies any headache, no fever, no chest pain or pressure, no shortness of breath , +ve abdominal pain. No focal weakness.     Assessment  & Plan :     1.  Acute cholecystitis.  Currently on Rocephin, primary team and general surgery, defer management to general surgery.  Will be moderate risk for adverse cardiopulmonary outcome during the perioperative.,  History of CAD with stent in the past.  Currently no active chest pain or shortness of breath, no audible murmurs, no history suggestive of frank CHF, he can climb 2 flights of stairs on a good day without any chest symptoms.  Echo and EKG nonacute. Was cleared for surgery by me and Cards 09/20/17.   2.  DM type II in poor control.  History of diabetic retinopathy and peripheral neuropathy.  Placed on Lantus twice daily since he is n.p.o. after low-dose along with every 4 hours sliding scale.  Monitor CBGs.  If persistently over 250 will consider adding low-dose insulin drip.  Lab Results  Component Value Date   HGBA1C 7.6 06/09/2017   CBG (last 3)  Recent Labs    09/20/17 2323 09/21/17 0355  09/21/17 0802  GLUCAP 164* 161* 142*    3.  Hypokalemia.  Replaced and stable.  4.  CAD history.  No acute issues, continue home dose statin, resume aspirin after surgery for secondary prevention.  5.  Essential hypertension.  Currently on Norvasc along with as needed hydralazine IV Lopressor.  Will resume ACE inhibitor after his surgical procedure is done.  6.  Dyslipidemia.  Continue home dose statin.  7.  Mild chronic diastolic CHF EF 19%.  Compensated.  8.  ARF.  ACE inhibitor held, hydrate and monitor.  9.  Right foot ulcer on the plantar aspect of the first big toe.  No signs of infection, wound care.   Procedures :  TTE - Left ventricle: The cavity size was normal. Systolic function was  normal. The estimated ejection fraction was in the range of 60%  to 65%. Wall motion was normal; there were no regional wall motion abnormalities. Doppler parameters are consistent with both  elevated ventricular end-diastolic filling pressure and elevated   left atrial filling pressure.  Left atrium: The atrium was mildly dilated. Atrial septum: No defect or patent foramen ovale was identified.   Diet : Diet NPO time specified    Family Communication  :  None  Code Status :  Full  Disposition Plan  :  TBD   DVT Prophylaxis  :  Lovenox    Lab Results  Component Value Date   PLT 162 09/21/2017    Inpatient Medications  Scheduled Meds: . amLODipine  5 mg Oral Daily  . atorvastatin  40 mg Oral q1800  . dorzolamide  1 drop Right Eye BID  . enoxaparin (LOVENOX) injection  40 mg Subcutaneous Q24H  . insulin aspart  0-9 Units Subcutaneous TID WC  . insulin glargine  8 Units Subcutaneous BID  . pantoprazole (PROTONIX) IV  40 mg Intravenous QHS  . timolol  1 drop Right Eye BID   Continuous Infusions: . 0.9 % NaCl with KCl 20 mEq / L 75 mL/hr at 09/21/17 0914  . piperacillin-tazobactam (ZOSYN)  IV     PRN Meds:.acetaminophen **OR** acetaminophen, diphenhydrAMINE **OR**  diphenhydrAMINE, hydrALAZINE, metoprolol tartrate, morphine injection, ondansetron **OR** ondansetron (ZOFRAN) IV, oxyCODONE  Antibiotics  :    Anti-infectives (From admission, onward)   Start     Dose/Rate Route Frequency Ordered Stop   09/21/17 1030  piperacillin-tazobactam (ZOSYN) IVPB 3.375 g     3.375 g 12.5 mL/hr over 240 Minutes Intravenous Every 8 hours 09/21/17 0930     09/19/17 1200  cefTRIAXone (ROCEPHIN) 2 g in dextrose 5 % 50 mL IVPB  Status:  Discontinued     2 g 100 mL/hr over 30 Minutes Intravenous Every 24 hours 09/19/17 1120 09/21/17 0930         Objective:   Vitals:   09/20/17 1400 09/20/17 2118 09/21/17 0549 09/21/17 0912  BP: 140/78 129/68 115/62 124/62  Pulse: 80 88 85   Resp: 17 17 18    Temp: 98.5 F (36.9 C) 98.1 F (36.7 C) 98.2 F (36.8 C)   TempSrc: Oral Oral Oral   SpO2: 92% 92% 95%     Wt Readings from Last 3 Encounters:  07/13/17 102.5 kg (226 lb)  06/09/17 98.5 kg (217 lb 3.2 oz)  03/05/17 101.2 kg (223 lb 3.2 oz)     Intake/Output Summary (Last 24 hours) at 09/21/2017 1155 Last data filed at 09/21/2017 1000 Gross per 24 hour  Intake 2591.67 ml  Output 1300 ml  Net 1291.67 ml     Physical Exam  Awake Alert, Oriented X 3, No new F.N deficits, Normal affect [place].AT,PERRAL Supple Neck,No JVD, No cervical lymphadenopathy appriciated.  Symmetrical Chest wall movement, Good air movement bilaterally, CTAB RRR,No Gallops, Rubs or new Murmurs, No Parasternal Heave +ve B.Sounds, Abd Soft, +ve RUQ tenderness, No organomegaly appriciated, No rebound - guarding or rigidity. No Cyanosis, Clubbing or edema, No new Rash or bruise, old healed  ulcer base of the right first toe, no signs of cellulitis     Data Review:    CBC Recent Labs  Lab 09/19/17 0158 09/20/17 0516 09/21/17 0425  WBC 12.7* 22.1* 23.0*  HGB 13.5 13.7 12.6*  HCT 39.0 39.9 38.0*  PLT 172 155 162  MCV 84.1 84.5 87.0  MCH 29.1 29.0 28.8  MCHC 34.6 34.3 33.2  RDW 13.0  13.2 13.6  LYMPHSABS 1.2  --   --   MONOABS 1.1*  --   --   EOSABS 0.0  --   --   BASOSABS 0.0  --   --     Chemistries  Recent Labs  Lab 09/19/17 0158 09/20/17 0516 09/21/17 0425  NA 138 138 139  K 3.2* 3.1* 3.9  CL 101 103 106  CO2 26 25 26   GLUCOSE 207* 241* 169*  BUN 17 21* 39*  CREATININE 0.70 0.68 1.46*  CALCIUM 9.4 8.4* 7.8*  MG  --   --  1.9  AST 23 53* 181*  ALT 18 45 171*  ALKPHOS 70 81 84  BILITOT 1.1 1.5* 1.3*   ------------------------------------------------------------------------------------------------------------------ No results for input(s): CHOL, HDL, LDLCALC, TRIG, CHOLHDL, LDLDIRECT in the last 72 hours.  Lab Results  Component Value Date   HGBA1C 7.6 06/09/2017   ------------------------------------------------------------------------------------------------------------------ No results for input(s): TSH, T4TOTAL, T3FREE, THYROIDAB in the last 72 hours.  Invalid input(s): FREET3 ------------------------------------------------------------------------------------------------------------------ No results for input(s): VITAMINB12, FOLATE, FERRITIN, TIBC, IRON, RETICCTPCT in the last 72 hours.  Coagulation profile No results for input(s): INR, PROTIME in the last 168 hours.  No results for input(s): DDIMER in the last 72 hours.  Cardiac Enzymes No results for input(s): CKMB, TROPONINI, MYOGLOBIN in the last 168 hours.  Invalid input(s): CK ------------------------------------------------------------------------------------------------------------------    Component Value Date/Time   BNP 80.1 09/21/2017 0743    Micro Results Recent Results (from the past 240 hour(s))  Blood culture (routine x 2)     Status: None (Preliminary result)   Collection Time: 09/19/17  6:44 AM  Result Value Ref Range Status   Specimen Description   Final    BLOOD LEFT HAND Performed at [facility] [address], [place], [place]  [address]    Special Requests   Final    BOTTLES DRAWN AEROBIC AND ANAEROBIC Blood Culture adequate volume Performed at [facility] [address] [place], [place] [address]    Culture   Final    NO GROWTH 1 DAY Performed at [facility], [place] [address] [place], [place] [address]    Report Status PENDING  Incomplete  Blood culture (routine x 2)     Status: None (Preliminary result)   Collection Time: 09/19/17  6:51 AM  Result Value Ref Range Status   Specimen Description   Final    BLOOD RIGHT HAND Performed at [facility] [address] [place], [place] [address]    Special Requests   Final    BOTTLES DRAWN AEROBIC AND ANAEROBIC Blood Culture adequate volume Performed at [facility] [address], [place], [place] [address]    Culture   Final    NO GROWTH 1 DAY Performed at [facility], [place] [address], [place], [place] [address]    Report Status PENDING  Incomplete  MRSA PCR Screening     Status: None   Collection Time: 09/20/17  8:05 AM  Result Value Ref Range Status   MRSA by  PCR NEGATIVE NEGATIVE Final    Comment:        The GeneXpert MRSA Assay (FDA approved for NASAL specimens only), is one component of a comprehensive MRSA colonization surveillance program. It is not intended to diagnose MRSA infection nor to guide or monitor treatment for MRSA infections. Performed at [facility], [place] [address], [place], [place] [address]     Radiology Reports Ct Abdomen Pelvis W Contrast  Result Date: 09/19/2017 CLINICAL DATA:  Abdominal distention. Nausea and vomiting. No bowel movement in 2 days. EXAM: CT ABDOMEN AND PELVIS WITH CONTRAST TECHNIQUE: Multidetector CT imaging of the abdomen and pelvis was performed using the standard protocol following bolus administration of intravenous contrast. CONTRAST:  171m ISOVUE-300 IOPAMIDOL (ISOVUE-300) INJECTION 61% COMPARISON:   Ultrasound right upper quadrant 09/19/2017. Abdominal series 09/19/2017. CT pelvis 05/05/2014 FINDINGS: Lower chest: Mild dependent changes in the lung bases. Hepatobiliary: Gallbladder is distended. No wall thickening. No focal stones identified. No bile duct dilatation. No focal liver lesions. Pancreas: Unremarkable. No pancreatic ductal dilatation or surrounding inflammatory changes. Spleen: Normal in size without focal abnormality. Adrenals/Urinary Tract: Adrenal glands are unremarkable. Kidneys are normal, without renal calculi, focal lesion, or hydronephrosis. Bladder is unremarkable. Stomach/Bowel: Stomach, small bowel, and colon are mostly decompressed. Scattered stool within the colon. No wall thickening or inflammatory changes identified. Appendix is normal. Vascular/Lymphatic: Aortic atherosclerosis. No enlarged abdominal or pelvic lymph nodes. Reproductive: Prostate is unremarkable. Calcification of the vas deferens. Other: No free air or free fluid in the abdomen. Abdominal wall musculature appears intact. No mesenteric edema. Musculoskeletal: Mild degenerative changes in the spine and hips. No destructive bone lesions. IMPRESSION: 1. Distended gallbladder without wall thickening, stone, or infiltration. 2. Aortic atherosclerosis. 3. No bowel obstruction or inflammation. Electronically Signed   By: WLucienne CapersM.D.   On: 09/19/2017 06:19   UKoreaAbdomen Limited  Result Date: 09/19/2017 CLINICAL DATA:  Right upper quadrant pain, epigastric pain, and vomiting. EXAM: ULTRASOUND ABDOMEN LIMITED RIGHT UPPER QUADRANT COMPARISON:  None. FINDINGS: Gallbladder: Gallbladder is distended and filled with echogenic material consistent with sludge and/or small stones. No gallbladder wall thickening. Murphy's sign is negative. Common bile duct: Diameter: 4 mm, normal Liver: No focal lesion identified. Within normal limits in parenchymal echogenicity. Portal vein is patent on color Doppler imaging with normal  direction of blood flow towards the liver. IMPRESSION: Distended gallbladder filled with echogenic material consistent with sludge or small stones. No additional changes to suggest cholecystitis. Electronically Signed   By: WLucienne CapersM.D.   On: 09/19/2017 05:15   Dg Chest Port 1 View  Result Date: 09/21/2017 CLINICAL DATA:  Shortness of Breath EXAM: PORTABLE CHEST 1 VIEW COMPARISON:  09/19/2017 FINDINGS: Cardiac shadow is accentuated by the portable technique. Lungs are hypoinflated with mild bibasilar atelectatic change. No bony abnormality is noted. IMPRESSION: Poor inspiratory effort with bibasilar atelectatic change. Electronically Signed   By: MInez CatalinaM.D.   On: 09/21/2017 07:56   Dg Abdomen Acute W/chest  Result Date: 09/19/2017 CLINICAL DATA:  Upper abdominal pain and nausea. No bowel movement over 2 days. EXAM: DG ABDOMEN ACUTE W/ 1V CHEST COMPARISON:  Chest 05/05/2014 FINDINGS: Normal heart size and pulmonary vascularity. No focal airspace disease or consolidation in the lungs. No blunting of costophrenic angles. No pneumothorax. Mediastinal contours appear intact. Gas and stool throughout the colon. Hepatic flexure of the colon is focally no small or large bowel distention. No free intra-abdominal air. No abnormal air-fluid levels. No radiopaque stones.  Calcification in the vas deferens. Displaced suggesting an enlarged gallbladder. IMPRESSION: No evidence of active pulmonary disease. Right upper quadrant soft tissues suggest an enlarged gallbladder. Nonobstructive bowel gas pattern. Electronically Signed   By: Lucienne Capers M.D.   On: 09/19/2017 03:40    Time Spent in minutes  30   Lala Lund M.D on 09/21/2017 at 11:55 AM  Between 7am to 7pm - Pager - [phone] ( page via [email or web address], text pages only, please mention full 10 digit call back number). After 7pm go to [email or web address] - password [email or web address]

## 2017-09-21 NOTE — Transfer of Care (Signed)
Immediate Anesthesia Transfer of Care Note  Patient: Derek Lowery  Procedure(s) Performed: LAPAROSCOPIC CHOLECYSTECTOMY WITH INTRAOPERATIVE CHOLANGIOGRAM (N/A )  Patient Location: PACU  Anesthesia Type:General  Level of Consciousness: awake, alert  and oriented  Airway & Oxygen Therapy: Patient Spontanous Breathing  Post-op Assessment: Report given to RN and Post -op Vital signs reviewed and stable  Post vital signs: Reviewed and stable  Last Vitals:  Vitals:   09/21/17 0549 09/21/17 0912  BP: 115/62 124/62  Pulse: 85   Resp: 18   Temp: 36.8 C   SpO2: 95%     Last Pain:  Vitals:   09/21/17 1032  TempSrc:   PainSc: Asleep      Patients Stated Pain Goal: 3 (70/14/10 3013)  Complications: No apparent anesthesia complications

## 2017-09-21 NOTE — Anesthesia Procedure Notes (Signed)
Procedure Name: Intubation Date/Time: 09/21/2017 1:49 PM Performed by: Icelynn Onken D, CRNA Pre-anesthesia Checklist: Patient identified, Emergency Drugs available, Suction available and Patient being monitored Patient Re-evaluated:Patient Re-evaluated prior to induction Oxygen Delivery Method: Circle system utilized Preoxygenation: Pre-oxygenation with 100% oxygen Induction Type: IV induction Ventilation: Mask ventilation without difficulty Laryngoscope Size: Mac and 4 Grade View: Grade I Tube type: Oral Tube size: 7.5 mm Number of attempts: 1 Airway Equipment and Method: Stylet and Oral airway Placement Confirmation: ETT inserted through vocal cords under direct vision,  positive ETCO2 and breath sounds checked- equal and bilateral Secured at: 22 cm Tube secured with: Tape Dental Injury: Teeth and Oropharynx as per pre-operative assessment

## 2017-09-21 NOTE — Discharge Instructions (Signed)
LAPAROSCOPIC SURGERY: POST OP INSTRUCTIONS  ######################################################################  EAT Gradually transition to a high fiber diet with a fiber supplement over the next few weeks after discharge.  Start with a pureed / full liquid diet (see below)  WALK Walk an hour a day.  Control your pain to do that.    CONTROL PAIN Control pain so that you can walk, sleep, tolerate sneezing/coughing, go up/down stairs.  HAVE A BOWEL MOVEMENT DAILY Keep your bowels regular to avoid problems.  OK to try a laxative to override constipation.  OK to use an antidairrheal to slow down diarrhea.  Call if not better after 2 tries  CALL IF YOU HAVE PROBLEMS/CONCERNS Call if you are still struggling despite following these instructions. Call if you have concerns not answered by these instructions  ######################################################################    1. DIET: Follow a light bland diet the first 24 hours after arrival home, such as soup, liquids, crackers, etc.  Be sure to include lots of fluids daily.  Avoid fast food or heavy meals as your are more likely to get nauseated.  Eat a low fat the next few days after surgery.   2. Take your usually prescribed home medications unless otherwise directed. 3. PAIN CONTROL: a. Pain is best controlled by a usual combination of three different methods TOGETHER: i. Ice/Heat ii. Over the counter pain medication iii. Prescription pain medication b. Most patients will experience some swelling and bruising around the incisions.  Ice packs or heating pads (30-60 minutes up to 6 times a day) will help. Use ice for the first few days to help decrease swelling and bruising, then switch to heat to help relax tight/sore spots and speed recovery.  Some people prefer to use ice alone, heat alone, alternating between ice & heat.  Experiment to what works for you.  Swelling and bruising can take several weeks to resolve.   c. It is  helpful to take an over-the-counter pain medication regularly for the first few weeks.  Choose one of the following that works best for you: i. Naproxen (Aleve, etc)  Two 27m tabs twice a day ii. Ibuprofen (Advil, etc) Three 2026mtabs four times a day (every meal & bedtime) iii. Acetaminophen (Tylenol, etc) 500-65023mour times a day (every meal & bedtime) d. A  prescription for pain medication (such as oxycodone, hydrocodone, etc) should be given to you upon discharge.  Take your pain medication as prescribed.  i. If you are having problems/concerns with the prescription medicine (does not control pain, nausea, vomiting, rash, itching, etc), please call us [phone] see if we need to switch you to a different pain medicine that will work better for you and/or control your side effect better. ii. If you need a refill on your pain medication, please contact your pharmacy.  They will contact our office to request authorization. Prescriptions will not be filled after 5 pm or on week-ends. 4. Avoid getting constipated.  Between the surgery and the pain medications, it is common to experience some constipation.  Increasing fluid intake and taking a fiber supplement (such as Metamucil, Citrucel, FiberCon, MiraLax, etc) 1-2 times a day regularly will usually help prevent this problem from occurring.  A mild laxative (prune juice, Milk of Magnesia, MiraLax, etc) should be taken according to package directions if there are no bowel movements after 48 hours.   5. Watch out for diarrhea.  If you have many loose bowel movements, simplify your diet to bland foods & liquids for  a few days.  Stop any stool softeners and decrease your fiber supplement.  Switching to mild anti-diarrheal medications (Kayopectate, Pepto Bismol) can help.  If this worsens or does not improve, please call us. 6. Wash / shower every day.  You may shower over the dressings as they are waterproof.  Continue to shower over incision(s)  after the dressing is off. 7. Remove your waterproof bandages 5 days after surgery.  You may leave the incision open to air.  You may replace a dressing/Band-Aid to cover the incision for comfort if you wish.  8. ACTIVITIES as tolerated:   a. You may resume regular (light) daily activities beginning the next day--such as daily self-care, walking, climbing stairs--gradually increasing activities as tolerated.  If you can walk 30 minutes without difficulty, it is safe to try more intense activity such as jogging, treadmill, bicycling, low-impact aerobics, swimming, etc. b. Save the most intensive and strenuous activity for last such as sit-ups, heavy lifting, contact sports, etc  Refrain from any heavy lifting or straining until you are off narcotics for pain control.   c. DO NOT PUSH THROUGH PAIN.  Let pain be your guide: If it hurts to do something, don't do it.  Pain is your body warning you to avoid that activity for another week until the pain goes down. d. You may drive when you are no longer taking prescription pain medication, you can comfortably wear a seatbelt, and you can safely maneuver your car and apply brakes. e. Dennis Bast may have sexual intercourse when it is comfortable.  9. FOLLOW UP in our office a. Please call [facility] at [phone] to set up an appointment to see your surgeon in the office for a follow-up appointment approximately 2-3 weeks after your surgery. b. Make sure that you call for this appointment the day you arrive home to insure a convenient appointment time. 10. IF YOU HAVE DISABILITY OR FAMILY LEAVE FORMS, BRING THEM TO THE OFFICE FOR PROCESSING.  DO NOT GIVE THEM TO YOUR DOCTOR.   WHEN TO CALL us [phone]: 1. Poor pain control 2. Reactions / problems with new medications (rash/itching, nausea, etc)  3. Fever over 101.5 F (38.5 C) 4. Inability to urinate 5. Nausea and/or vomiting 6. Worsening swelling or bruising 7. Continued bleeding from incision. 8. Increased  pain, redness, or drainage from the incision   The clinic staff is available to answer your questions during regular business hours (8:30am-5pm).  Please dont hesitate to call and ask to speak to one of our nurses for clinical concerns.   If you have a medical emergency, go to the nearest emergency room or call 911.  A surgeon from [facility] is always on call at the [facility], [place], [place], [place], [place]  [address] ? MAIN: [phone] ? TOLL FREE: [phone] ?  FAX [phone] [email or web address]   Cholecystitis Cholecystitis is inflammation of the gallbladder. It is often called a gallbladder attack. The gallbladder is a pear-shaped organ that lies beneath the liver on the right side of the body. The gallbladder stores bile, which is a fluid that helps the body to digest fats. If bile builds up in your gallbladder, your gallbladder becomes inflamed. This condition may occur suddenly (be acute). Repeat episodes of acute cholecystitis or prolonged episodes may lead to a long-term (chronic) condition. Cholecystitis is serious and it requires treatment. What are the causes? The most common cause of this condition  is gallstones. Gallstones can block the tube (duct) that carries bile out of your gallbladder. This causes bile to build up. Other causes of this condition include:  Damage to the gallbladder due to a decrease in blood flow.  Infections in the bile ducts.  Scars or kinks in the bile ducts.  Tumors in the liver, pancreas, or gallbladder.  What increases the risk? This condition is more likely to develop in:  People who have sickle cell disease.  People who take birth control pills or use estrogen.  People who have alcoholic liver disease.  People who have liver cirrhosis.  People who have their nutrition delivered through a vein (parenteral nutrition).  People who do not eat or drink  (do fasting) for a long period of time.  People who are obese.  People who have rapid weight loss.  People who are pregnant.  People who have increased triglyceride levels.  People who have pancreatitis.  What are the signs or symptoms? Symptoms of this condition include:  Abdominal pain, especially in the upper right area of the abdomen.  Abdominal tenderness or bloating.  Nausea.  Vomiting.  Fever.  Chills.  Yellowing of the skin and the whites of the eyes (jaundice).  How is this diagnosed? This condition is diagnosed with a medical history and physical exam. You may also have other tests, including:  Imaging tests, such as: ? An ultrasound of the gallbladder. ? A CT scan of the abdomen. ? A gallbladder nuclear scan (HIDA scan). This scan allows your health care provider to see the bile moving from your liver to your gallbladder and to your small intestine. ? MRI.  Blood tests, such as: ? A complete blood count, because the white blood cell count may be higher than normal. ? Liver function tests, because some levels may be higher than normal with certain types of gallstones.  How is this treated? Treatment may include:  Fasting for a certain amount of time.  IV fluids.  Medicine to treat pain or vomiting.  Antibiotic medicine.  Surgery to remove your gallbladder (cholecystectomy). This may happen immediately or at a later time.  Follow these instructions at home: Home care will depend on your treatment. In general:  Take over-the-counter and prescription medicines only as told by your health care provider.  If you were prescribed an antibiotic medicine, take it as told by your health care provider. Do not stop taking the antibiotic even if you start to feel better.  Follow instructions from your health care provider about what to eat or drink. When you are allowed to eat, avoid eating or drinking anything that triggers your symptoms.  Keep all  follow-up visits as told by your health care provider. This is important.  Contact a health care provider if:  Your pain is not controlled with medicine.  You have a fever. Get help right away if:  Your pain moves to another part of your abdomen or to your back.  You continue to have symptoms or you develop new symptoms even with treatment. This information is not intended to replace advice given to you by your health care provider. Make sure you discuss any questions you have with your health care provider. Document Released: 08/04/2005 Document Revised: 12/13/2015 Document Reviewed: 11/15/2014 Elsevier Interactive Patient Education  2018 [employer].

## 2017-09-21 NOTE — Progress Notes (Signed)
Notified RN in OR that patient is to receive D5W at 75 and CBGs q2 hour.  Verbal order from Dr. Candiss Norse.  Iantha Fallen RN 2:18 PM 09/21/2017

## 2017-09-21 NOTE — Consult Note (Signed)
[facility] Nurse wound consult note Reason for Consult:neuropathic ulceration on right foot, plantar aspect Wound type:neuropathic Pressure Injury POA: N/A Measurement: 2cm round area of callous with 0.6cm x 0.4cm x 0.2cm wound in center. Wound bed:dry, dark Drainage (amount, consistency, odor) scant serous Periwound: callous Dressing procedure/placement/frequency: Patient with chronic, full thickness ulceration on the right foot, 5th met head.  Requires routine paring and topical wound care, perhaps even off loading orthotics to have ulcer heel.  Would benefit from referral to outpatient [facility] or podiatry for continuing care. HHRN may also be appropriate.  In the interim, I will initiate a xeroform gauze for antimicrobial properties and moisture donation. We'll do twice daily while here.  [facility] nursing team will not follow, but will remain available to this patient, the nursing and medical teams.  Please re-consult if needed. Thanks, Maudie Flakes, MSN, RN, [place], Arther Abbott  Pager# [phone]

## 2017-09-21 NOTE — Progress Notes (Signed)
  Echocardiogram 2D Echocardiogram has been performed.  Derek Lowery G Derek Lowery 09/21/2017, 11:07 AM

## 2017-09-21 NOTE — Op Note (Signed)
09/21/2017  PATIENT:  Derek Lowery  62 y.o. male  Patient Care Team: Ladell Pier, MD as PCP - General (Internal Medicine) Skeet Latch, MD as PCP - Cardiology (Cardiology) Hillary Bow, MD as Consulting Physician (Cardiology) Juanita Craver, MD as Consulting Physician (Gastroenterology)  PRE-OPERATIVE DIAGNOSIS:     Acute calculus cholecystitis  POST-OPERATIVE DIAGNOSIS:   Acute Gangrenous Cholecystitis Liver: Fatty steatohepatitis  PROCEDURE:  Laparoscopic cholecystectomy  SURGEON:  Adin Hector, MD, FACS.  ASSISTANT: OR Staff   ANESTHESIA:    General with endotracheal intubation Local anesthetic as a field block  EBL:  (See Anesthesia Intraoperative Record) Total I/O In: 1610 [P.O.:360; I.V.:1250] Out: 0   Delay start of Pharmacological VTE agent (>24hrs) due to surgical blood loss or risk of bleeding:  no  DRAINS: None   SPECIMEN: Gallbladder    DISPOSITION OF SPECIMEN:  PATHOLOGY  COUNTS:  YES  PLAN OF CARE: Admit to inpatient   PATIENT DISPOSITION:  PACU - hemodynamically stable.  INDICATION: Poorly controlled diabetic male with pain and evidence of cholecystitis.  Admitted.  Hydrated.  IV antibiotics.  Recommendation made for cholecystectomy  The anatomy & physiology of hepatobiliary & pancreatic function was discussed.  The pathophysiology of gallbladder dysfunction was discussed.  Natural history risks without surgery was discussed.   I feel the risks of no intervention will lead to serious problems that outweigh the operative risks; therefore, I recommended cholecystectomy to remove the pathology.  I explained laparoscopic techniques with possible need for an open approach.  Probable cholangiogram to evaluate the bilary tract was explained as well.    Risks such as bleeding, infection, abscess, leak, injury to other organs, need for further treatment, heart attack, death, and other risks were discussed.  I noted a good likelihood this  will help address the problem.  Possibility that this will not correct all abdominal symptoms was explained.  Goals of post-operative recovery were discussed as well.  We will work to minimize complications.  An educational handout further explaining the pathology and treatment options was given as well.  Questions were answered.  The patient expresses understanding & wishes to proceed with surgery.  OR FINDINGS: Patient had complete gangrene of the gallbladder going down to and involving the cystic duct.  Held off on cholangiogram.  Liver: Fatty steatohepatitis  DESCRIPTION:   The patient was identified & brought in the operating room. The patient was positioned supine with arms tucked. SCDs were active during the entire case. The patient underwent general anesthesia without any difficulty.  The abdomen was prepped and draped in a sterile fashion. A Surgical Timeout confirmed our plan.  I made a transverse curvilinear incision through the superior umbilical fold.  I placed a 42m long port through the supraumbilical fascia using a modified Hassan cutdown technique with umbilical stalk fascial countertraction. I began carbon dioxide insufflation.  No change in end tidal CO2 measurement.   Camera inspection revealed no injury. There were no adhesions to the anterior abdominal wall supraumbilically.  I proceeded to continue with single site technique. I placed a #5 port in left upper aspect of the wound. I placed a 5 mm atraumatic grasper in the right inferior aspect of the wound.  I turned attention to the right upper quadrant.  There is an obvious phlegmon in that region.  I peeled off inflamed transverse colon, omentum, and mesocolon.  This exposed a black and gangrenous gallbladder with a chronic greenish black rind.   I did not  feel this was a appropriate case for single site approach.  I was going to have to place a drain.  Therefore I placed an extra port in the right upper quadrant.  I needle  aspirated the gallbladder and released motor oil thick bile.   the gallbladder fundus was elevated cephalad. I freed adhesions to the ventral surface of the gallbladder off carefully.  I freed the peritoneal coverings between the gallbladder and the liver on the posteriolateral and anteriomedial walls. I alternated between Harmonic & blunt Maryland dissection to help get a good critical view of the cystic artery and cystic duct. I did further dissection to free 70% of the gallbladder off the liver bed to get a good critical view of the infundibulum and cystic duct.   I dissected out the cystic artery; and, after getting a good 360 view, ligated the anterior & posterior branches of the cystic artery close on the infundibulum using the Harmonic ultrasonic dissection.  Also clips on a dominant posterior arterial branch.  I ended up transitioning to a dome down approach and freed the rest of the gallbladder off the liver and skeletonized the infundibulum until only the cystic duct remained.  There still seem to be some gangrene on it as well.  It was thickened.  I do not feel clips alone would be safe.  I therefore ligated the cystic duct with 0 PDS Endoloops x2.  Placed clips in between it.  Transected the gallbladder off this ligated cystic duct.  Placed in an EcoSac bag.  At this time, I had already done 2 L of irrigation to help clear out the right upper quadrant.  I ensured hemostasis on the gallbladder fossa of the liver and elsewhere. I inspected the rest of the abdomen & detected no injury nor bleeding elsewhere.  I did a final irrigation of clindamycin/gentamicin irrigation and held it in place for 10 minutes.  Placed a 28 [place] Blake drain through the right upper quadrant port site down in Morison's pouch up the Portis and then onlay onto the thickened rind that had patch of gangrene on the omentum.  I removed the gallbladder out the supraumbilical fascia.  I had to dilate the fascia to 2 cm to get it  out.  I closed the fascia transversely using #1 PDS interrupted stitches. I closed the skin using 4-0 monocryl stitch.  Sterile dressing was applied. The patient was extubated & arrived in the PACU in stable condition.  I made an attempt to locate family to discuss patient's status and recommendations.  No one is available at this time.  He did not want me to call or talk to anyone   We will try again later.  I believe this patient will need IV antibiotics for at least 5 days.  Drain needs to stay a couple weeks to have him disprove a moderately high risk of delayed ischemic cystic duct leak.   Adin Hector, M.D., F.A.C.S. Gastrointestinal and Minimally Invasive Surgery [facility] [address], [place], [place] [id number] [phone] Main / Paging  09/21/2017 3:56 PM

## 2017-09-21 NOTE — Progress Notes (Signed)
Notified Dr. Candiss Norse via text page that patient is to have surgery today.  RN asked him if he wanted to resume the D5 IV fluid order.  Waiting for response.  Iantha Fallen RN 1200 09/21/2017

## 2017-09-21 NOTE — Progress Notes (Signed)
Notified for K pad - hospital does not have this product available.   Iantha Fallen RN 1000 09/21/2017

## 2017-09-21 NOTE — Progress Notes (Signed)
Notified Dr. Candiss Norse of presence of DM foot ulcer on R foot.  Placed wound consult.  Dr. Candiss Norse stated he would take a look at wound.  No further concerns at this time.  Iantha Fallen RN 1000 09/21/2017

## 2017-09-21 NOTE — Progress Notes (Signed)
1 Day Post-Op    CC:  Nausea and vomiting x 2 day  Subjective: Still having some pain RUQ going to shoulder.  Tolerating clears well. Creatinine up, WBC is up,  Echo is scheduled, RBBB is not an issue per cardiology.     Objective: Vital signs in last 24 hours: Temp:  [98.1 F (36.7 C)-98.5 F (36.9 C)] 98.2 F (36.8 C) (02/04 0549) Pulse Rate:  [80-88] 85 (02/04 0549) Resp:  [17-18] 18 (02/04 0549) BP: (115-140)/(62-78) 115/62 (02/04 0549) SpO2:  [92 %-95 %] 95 % (02/04 0549)  680 PO 1700 IV 1900 urine Afebrile, VSS  BP is better now Glucose control is better, but creatinine is up to 1.46 K+ replaced WBC is up  [place] 2/2: Distended gallbladder filled with material consistent with sludge or small stones.  No gallbladder wall thickening, Murphy sign is negative. CT scan 2/2: Dependent atelectasis, gallbladder was distended without wall thickening no focal stones identified bile duct was not dilated.  Pancreas was normal.   Intake/Output from previous day: 02/03 0701 - 02/04 0700 In: 2331.7 [P.O.:680; I.V.:1201.7; IV Piggyback:450] Out: 1900 [Urine:1900] Intake/Output this shift: No intake/output data recorded.  General appearance: alert, cooperative, no distress and Still complaining of pain.  He notes in the right upper quadrant going to the right shoulder. Resp: clear to auscultation bilaterally GI: Soft, tender right upper quadrant.  Positive bowel sounds.  Tolerating clear liquids well.  Lab Results:  Recent Labs    09/20/17 0516 09/21/17 0425  WBC 22.1* 23.0*  HGB 13.7 12.6*  HCT 39.9 38.0*  PLT 155 162    BMET Recent Labs    09/20/17 0516 09/21/17 0425  NA 138 139  K 3.1* 3.9  CL 103 106  CO2 25 26  GLUCOSE 241* 169*  BUN 21* 39*  CREATININE 0.68 1.46*  CALCIUM 8.4* 7.8*   PT/INR No results for input(s): LABPROT, INR in the last 72 hours.  Recent Labs  Lab 09/19/17 0158 09/20/17 0516 09/21/17 0425  AST 23 53* 181*  ALT 18 45 171*  ALKPHOS  70 81 84  BILITOT 1.1 1.5* 1.3*  PROT 7.7 7.1 6.3*  ALBUMIN 4.1 3.5 2.8*     Lipase     Component Value Date/Time   LIPASE 23 09/19/2017 0158     Medications: . amLODipine  5 mg Oral Daily  . atorvastatin  40 mg Oral q1800  . dorzolamide  1 drop Right Eye BID  . enoxaparin (LOVENOX) injection  40 mg Subcutaneous Q24H  . insulin aspart  0-9 Units Subcutaneous TID WC  . insulin glargine  8 Units Subcutaneous BID  . pantoprazole (PROTONIX) IV  40 mg Intravenous QHS  . timolol  1 drop Right Eye BID   . 0.9 % NaCl with KCl 20 mEq / L 100 mL/hr (09/21/17 0209)  . cefTRIAXone (ROCEPHIN)  IV Stopped (09/20/17 1130)  . dextrose 5 % and 0.45% NaCl     Anti-infectives (From admission, onward)   Start     Dose/Rate Route Frequency Ordered Stop   09/19/17 1200  cefTRIAXone (ROCEPHIN) 2 g in dextrose 5 % 50 mL IVPB     2 g 100 mL/hr over 30 Minutes Intravenous Every 24 hours 09/19/17 1120        Assessment/Plan Acute calculus cholecystitis  Mild renal insuffiencey - creatinine 0.68 =>> 1.46 today LFT's are up  - bilirubin is stable  Leukocytosis - 23K  Poorly controlled insulin-dependent diabetes - A1C 7.6 Diabetic retinopathy/perpherial neuropathy  History of coronary artery disease/NSTEMI -status post stent 06/2011 - OK for surgery per Cardiology - restart ASA 81 after surgery - Echo is pending Hyperlipidemia Hypertension -poorly controlled  FEN:  IV fluids/Clear diet ID: rocephin 2/2 - day 3  WBC up =>> Zosyn 2/4 - day 1 DVT:  Lovenox Foley:  None Follow up DOW  Plan: Echo is pending, creatinine is up and WBC is up.  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  I will switch him over to Zosyn.  Will aim to do him today.     LOS: 2 days    Malary Aylesworth 09/21/2017 [phone]

## 2017-09-21 NOTE — Anesthesia Preprocedure Evaluation (Signed)
Anesthesia Evaluation  Patient identified by MRN, date of birth, ID band Patient awake    Reviewed: Allergy & Precautions, NPO status , Patient's Chart, lab work & pertinent test results  Airway Mallampati: I       Dental no notable dental hx. (+) Teeth Intact   Pulmonary neg pulmonary ROS,    Pulmonary exam normal breath sounds clear to auscultation       Cardiovascular hypertension, Pt. on medications Normal cardiovascular exam Rhythm:Regular Rate:Normal     Neuro/Psych negative psych ROS   GI/Hepatic   Endo/Other  diabetes, Poorly Controlled, Insulin Dependent  Renal/GU      Musculoskeletal   Abdominal Normal abdominal exam  (+)   Peds  Hematology negative hematology ROS (+)   Anesthesia Other Findings Derek Lowery  ECHO COMPLETE WO IMAGING ENHANCING AGENT  Order# [id number]  Reading physician: Josue Hector, MD Ordering physician: Josue Hector, MD Study date: 09/21/17 Study Result   Result status: Final result                             *[facility] [employer].                        [place], [place] [address]                            [phone]  ------------------------------------------------------------------- Transthoracic Echocardiography  Patient:    Derek Lowery, Derek Lowery MR #:       [id number] Study Date: 09/21/2017 Gender:     M Age:        62 Height:     177.8 cm Weight:     102.5 kg BSA:        2.28 m^2 Pt. Status: Room:       Marianna     Jenkins Rouge, M.D.  REFERRING    Jenkins Rouge, M.D.  ADMITTING    Ccs, Md  PERFORMING   [facility], Inpatient  ATTENDING    Palumbo, April  SONOGRAPHER  Dance, Tiffany  cc:  ------------------------------------------------------------------- LV EF: 60% -   65%  ------------------------------------------------------------------- Indications:       Abnormal EKG 794.31.  ------------------------------------------------------------------- History:   PMH:   Coronary artery disease.  PMH:   Myocardial infarction.  Risk factors:  Hypertension. Diabetes mellitus. Dyslipidemia.  ------------------------------------------------------------------- Study Conclusions  - Left ventricle: The cavity size was normal. Systolic function was   normal. The estimated ejection fraction was in the range of 60%   to 65%. Wall motion was normal; there were no regional wall   motion abnormalities. Doppler parameters are consistent with both   elevated ventricular end-diastolic filling pressure and elevated   left atrial filling pressure. - Left atrium: The atrium was mildly dilated. - Atrial     Reproductive/Obstetrics                             Anesthesia Physical Anesthesia Plan  ASA: III  Anesthesia Plan: General   Post-op Pain Management:    Induction: Intravenous  PONV Risk Score and Plan: 4 or greater  Airway Management Planned: Oral ETT  Additional Equipment:   Intra-op Plan:   Post-operative Plan: Extubation in OR  Informed Consent: I have reviewed the patients History and Physical, chart, labs and discussed the procedure including the risks, benefits and alternatives for the proposed anesthesia with the patient or authorized representative who has indicated his/her understanding and acceptance.   Dental advisory given  Plan Discussed with: CRNA and Surgeon  Anesthesia Plan Comments:         Anesthesia Quick Evaluation

## 2017-09-22 ENCOUNTER — Encounter (HOSPITAL_COMMUNITY): Payer: Self-pay | Admitting: Surgery

## 2017-09-22 DIAGNOSIS — I4510 Unspecified right bundle-branch block: Secondary | ICD-10-CM

## 2017-09-22 LAB — COMPREHENSIVE METABOLIC PANEL
ALBUMIN: 2.5 g/dL — AB (ref 3.5–5.0)
ALK PHOS: 92 U/L (ref 38–126)
ALT: 199 U/L — ABNORMAL HIGH (ref 17–63)
ANION GAP: 8 (ref 5–15)
AST: 199 U/L — ABNORMAL HIGH (ref 15–41)
BUN: 53 mg/dL — ABNORMAL HIGH (ref 6–20)
CALCIUM: 7 mg/dL — AB (ref 8.9–10.3)
CO2: 23 mmol/L (ref 22–32)
Chloride: 104 mmol/L (ref 101–111)
Creatinine, Ser: 1.49 mg/dL — ABNORMAL HIGH (ref 0.61–1.24)
GFR calc Af Amer: 58 mL/min — ABNORMAL LOW (ref >60)
GFR calc non Af Amer: 50 mL/min — ABNORMAL LOW (ref >60)
Glucose, Bld: 212 mg/dL — ABNORMAL HIGH (ref 65–99)
POTASSIUM: 3.5 mmol/L (ref 3.5–5.1)
SODIUM: 135 mmol/L (ref 135–145)
Total Bilirubin: 1.1 mg/dL (ref 0.3–1.2)
Total Protein: 5.4 g/dL — ABNORMAL LOW (ref 6.5–8.1)

## 2017-09-22 LAB — GLUCOSE, CAPILLARY
GLUCOSE-CAPILLARY: 202 mg/dL — AB (ref 65–99)
GLUCOSE-CAPILLARY: 264 mg/dL — AB (ref 65–99)
Glucose-Capillary: 204 mg/dL — ABNORMAL HIGH (ref 65–99)
Glucose-Capillary: 231 mg/dL — ABNORMAL HIGH (ref 65–99)

## 2017-09-22 LAB — CBC
HCT: 35.6 % — ABNORMAL LOW (ref 39.0–52.0)
Hemoglobin: 11.7 g/dL — ABNORMAL LOW (ref 13.0–17.0)
MCH: 28.5 pg (ref 26.0–34.0)
MCHC: 32.9 g/dL (ref 30.0–36.0)
MCV: 86.8 fL (ref 78.0–100.0)
Platelets: 146 10*3/uL — ABNORMAL LOW (ref 150–400)
RBC: 4.1 MIL/uL — ABNORMAL LOW (ref 4.22–5.81)
RDW: 13.3 % (ref 11.5–15.5)
WBC: 13.4 10*3/uL — ABNORMAL HIGH (ref 4.0–10.5)

## 2017-09-22 LAB — LIPASE, BLOOD: Lipase: 20 U/L (ref 11–51)

## 2017-09-22 LAB — OSMOLALITY, URINE: Osmolality, Ur: 570 mOsm/kg (ref 300–900)

## 2017-09-22 MED ORDER — LACTATED RINGERS IV SOLN: INTRAVENOUS | Status: DC

## 2017-09-22 MED ORDER — INSULIN ASPART 100 UNIT/ML ~~LOC~~ SOLN
0.0000 [IU] | Freq: Every day | SUBCUTANEOUS | Status: DC
  Administered 2017-09-22: 2 [IU] via SUBCUTANEOUS

## 2017-09-22 MED ORDER — OXYCODONE HCL 5 MG PO TABS
5.0000 mg | ORAL_TABLET | ORAL | Status: DC | PRN
  Filled 2017-09-22: qty 1

## 2017-09-22 MED ORDER — INSULIN GLARGINE 100 UNIT/ML ~~LOC~~ SOLN
12.0000 [IU] | Freq: Two times a day (BID) | SUBCUTANEOUS | Status: DC
  Administered 2017-09-22 – 2017-09-28 (×9): 12 [IU] via SUBCUTANEOUS
  Filled 2017-09-22 (×13): qty 0.12

## 2017-09-22 MED ORDER — SODIUM CHLORIDE 0.9 % IV BOLUS (SEPSIS): 500.0000 mL | Freq: Once | INTRAVENOUS | Status: DC

## 2017-09-22 MED ORDER — INSULIN ASPART 100 UNIT/ML ~~LOC~~ SOLN
0.0000 [IU] | Freq: Three times a day (TID) | SUBCUTANEOUS | Status: DC
  Administered 2017-09-22: 3 [IU] via SUBCUTANEOUS
  Administered 2017-09-23: 2 [IU] via SUBCUTANEOUS
  Administered 2017-09-23: 3 [IU] via SUBCUTANEOUS
  Administered 2017-09-23: 5 [IU] via SUBCUTANEOUS
  Administered 2017-09-24: 2 [IU] via SUBCUTANEOUS
  Administered 2017-09-24 – 2017-10-01 (×6): 1 [IU] via SUBCUTANEOUS

## 2017-09-22 MED ORDER — LACTATED RINGERS IV SOLN
INTRAVENOUS | Status: DC
  Administered 2017-09-22: 15:00:00 via INTRAVENOUS
  Administered 2017-09-23: 1000 mL via INTRAVENOUS

## 2017-09-22 MED ORDER — SODIUM CHLORIDE 0.9 % IV BOLUS (SEPSIS)
1000.0000 mL | Freq: Once | INTRAVENOUS | Status: AC
  Administered 2017-09-22: 1000 mL via INTRAVENOUS

## 2017-09-22 MED ORDER — ASPIRIN 81 MG PO CHEW
81.0000 mg | CHEWABLE_TABLET | Freq: Every day | ORAL | Status: DC
  Administered 2017-09-22 – 2017-10-01 (×10): 81 mg via ORAL
  Filled 2017-09-22 (×10): qty 1

## 2017-09-22 NOTE — Progress Notes (Signed)
Subjective:  Denies SSCP, palpitations or Dyspnea Taking PO   Objective:  Vitals:   09/21/17 1928 09/21/17 2120 09/22/17 0200 09/22/17 0529  BP: 140/77 (!) 146/73 106/63 119/70  Pulse: 95 96 78 84  Resp: 18 18 18 18   Temp: 97.8 F (36.6 C) 98.1 F (36.7 C) 98.3 F (36.8 C) 97.8 F (36.6 C)  TempSrc: Oral Oral Oral Oral  SpO2: 98% 98% 92% 96%  Weight:        Intake/Output from previous day:  Intake/Output Summary (Last 24 hours) at 09/22/2017 1275 Last data filed at 09/22/2017 0600 Gross per 24 hour  Intake 3306.25 ml  Output 930 ml  Net 2376.25 ml    Physical Exam: Affect appropriate Disheveled white male  HEENT: normal Neck supple with no adenopathy JVP normal no bruits no thyromegaly Lungs clear with no wheezing and good diaphragmatic motion Heart:  S1/S2 no murmur, no rub, gallop or click PMI normal Abdomen: post lap choly with JP drain in place  no bruit.  No HSM or HJR Distal pulses intact with no bruits No edema Neuro non-focal Skin warm and dry No muscular weakness   Lab Results: Basic Metabolic Panel: Recent Labs    09/21/17 0425 09/22/17 0516  NA 139 135  K 3.9 3.5  CL 106 104  CO2 26 23  GLUCOSE 169* 212*  BUN 39* 53*  CREATININE 1.46* 1.49*  CALCIUM 7.8* 7.0*  MG 1.9  --    Liver Function Tests: Recent Labs    09/21/17 0425 09/22/17 0516  AST 181* 199*  ALT 171* 199*  ALKPHOS 84 92  BILITOT 1.3* 1.1  PROT 6.3* 5.4*  ALBUMIN 2.8* 2.5*   Recent Labs    09/22/17 0516  LIPASE 20   CBC: Recent Labs    09/21/17 0425 09/22/17 0516  WBC 23.0* 13.4*  HGB 12.6* 11.7*  HCT 38.0* 35.6*  MCV 87.0 86.8  PLT 162 146*   Cardiac Enzymes: No results for input(s): CKTOTAL, CKMB, CKMBINDEX, TROPONINI in the last 72 hours. BNP: Invalid input(s): POCBNP D-Dimer: No results for input(s): DDIMER in the last 72 hours. Hemoglobin A1C: No results for input(s): HGBA1C in the last 72 hours. Fasting Lipid Panel: No results for  input(s): CHOL, HDL, LDLCALC, TRIG, CHOLHDL, LDLDIRECT in the last 72 hours. Thyroid Function Tests: No results for input(s): TSH, T4TOTAL, T3FREE, THYROIDAB in the last 72 hours.  Invalid input(s): FREET3 Anemia Panel: No results for input(s): VITAMINB12, FOLATE, FERRITIN, TIBC, IRON, RETICCTPCT in the last 72 hours.  Imaging: US Renal  Result Date: 09/21/2017 CLINICAL DATA:  Acute renal failure EXAM: RENAL / URINARY TRACT ULTRASOUND COMPLETE COMPARISON:  CT abdomen and pelvis September 19, 2017 FINDINGS: Right Kidney: Length: 11.4 cm. Echogenicity and renal cortical thickness are within normal limits. No mass, perinephric fluid, or hydronephrosis visualized. No sonographically demonstrable calculus or ureterectasis. Left Kidney: Length: 11.4 cm. Echogenicity and renal cortical thickness are within normal limits. No mass, perinephric fluid, or hydronephrosis visualized. No sonographically demonstrable calculus or ureterectasis. Bladder: Appears normal for degree of bladder distention. IMPRESSION: Study within normal limits. Electronically Signed   By: Lowella Grip III M.D.   On: 09/21/2017 19:22   Dg Chest Port 1 View  Result Date: 09/21/2017 CLINICAL DATA:  Shortness of Breath EXAM: PORTABLE CHEST 1 VIEW COMPARISON:  09/19/2017 FINDINGS: Cardiac shadow is accentuated by the portable technique. Lungs are hypoinflated with mild bibasilar atelectatic change. No bony abnormality is noted. IMPRESSION: Poor inspiratory effort with bibasilar  atelectatic change. Electronically Signed   By: Inez Catalina M.D.   On: 09/21/2017 07:56    Cardiac Studies:  ECG:  NSR no acute ST changes Chronic RBBB   Telemetry:  NSR no arrhythmia   Echo: 08/21/17 EF 60-65% normal only mild LAE   Medications:   . acetaminophen  1,000 mg Oral TID  . amLODipine  5 mg Oral Daily  . atorvastatin  40 mg Oral q1800  . dorzolamide  1 drop Right Eye BID  . enoxaparin (LOVENOX) injection  40 mg Subcutaneous Q24H  . feeding  supplement (GLUCERNA SHAKE)  237 mL Oral BID BM  . gabapentin  300 mg Oral BID  . insulin aspart  0-9 Units Subcutaneous TID WC  . insulin glargine  8 Units Subcutaneous BID  . lip balm  1 application Topical BID  . psyllium  1 packet Oral BID  . timolol  1 drop Right Eye BID     . lactated ringers    . piperacillin-tazobactam (ZOSYN)  IV 3.375 g (09/22/17 0246)    Assessment/Plan:  RBBB:  Abnormal ECG benign no cardiac issues or arrhythmias at this time  Echo normal  Post op:  Doing well on antibiotics gangrenous GB JP drain in per surgery   Will sign off   Jenkins Rouge 09/22/2017, 8:19 AM

## 2017-09-22 NOTE — Progress Notes (Signed)
1 Day Post-Op    CC: Abdominal pain  Subjective: Patient doing well this a.m. up in the chair.  It sounds like the only thing he has had so far is full liquids.  We will switch him over to a carb modified and heart healthy diet.  Port site looks fine.  The drain is currently just putting out serosanguineous fluid.   Objective: Vital signs in last 24 hours: Temp:  [97.8 F (36.6 C)-98.3 F (36.8 C)] 98 F (36.7 C) (02/05 0953) Pulse Rate:  [78-96] 80 (02/05 0953) Resp:  [12-20] 20 (02/05 0953) BP: (93-146)/(58-77) 93/58 (02/05 0953) SpO2:  [92 %-99 %] 94 % (02/05 0953) Weight:  [102 kg (224 lb 13.9 oz)] 102 kg (224 lb 13.9 oz) (02/04 1404) Last BM Date: 09/19/17 1080 p.o.  2200 IV Voided x4 Drain 930 Afebrile vital signs are stable although the last blood pressure was down some 93/58 Glucose is between 132 and 212. AST/ALT up slightly but stable.  Lipase is 28, bilirubin is down to 1.1 WBC improving  Intake/Output from previous day: 02/04 0701 - 02/05 0700 In: 3306.3 [P.O.:1080; I.V.:2126.3; IV Piggyback:100] Out: 930 [Drains:930] Intake/Output this shift: No intake/output data recorded.  General appearance: alert, cooperative and no distress Resp: clear to auscultation bilaterally GI: Soft, sore, port site dressing is fairly dry.  Drainage from the JP is serosanguineous.  Lab Results:  Recent Labs    09/21/17 0425 09/22/17 0516  WBC 23.0* 13.4*  HGB 12.6* 11.7*  HCT 38.0* 35.6*  PLT 162 146*    BMET Recent Labs    09/21/17 0425 09/22/17 0516  NA 139 135  K 3.9 3.5  CL 106 104  CO2 26 23  GLUCOSE 169* 212*  BUN 39* 53*  CREATININE 1.46* 1.49*  CALCIUM 7.8* 7.0*   PT/INR No results for input(s): LABPROT, INR in the last 72 hours.  Recent Labs  Lab 09/19/17 0158 09/20/17 0516 09/21/17 0425 09/22/17 0516  AST 23 53* 181* 199*  ALT 18 45 171* 199*  ALKPHOS 70 81 84 92  BILITOT 1.1 1.5* 1.3* 1.1  PROT 7.7 7.1 6.3* 5.4*  ALBUMIN 4.1 3.5 2.8*  2.5*     Lipase     Component Value Date/Time   LIPASE 20 09/22/2017 0516     Medications: . acetaminophen  1,000 mg Oral TID  . amLODipine  5 mg Oral Daily  . atorvastatin  40 mg Oral q1800  . dorzolamide  1 drop Right Eye BID  . enoxaparin (LOVENOX) injection  40 mg Subcutaneous Q24H  . feeding supplement (GLUCERNA SHAKE)  237 mL Oral BID BM  . gabapentin  300 mg Oral BID  . insulin aspart  0-9 Units Subcutaneous TID WC  . insulin glargine  8 Units Subcutaneous BID  . lip balm  1 application Topical BID  . psyllium  1 packet Oral BID  . timolol  1 drop Right Eye BID    Assessment/Plan Acute gangrenous cholecystitis S/P laparoscopic cholecystectomy and drain placement 09/21/17, Dr. Alwyn Pea  - Drain his stay in for 2 weeks -over concern for possible cystic duct leak  - Follow-up 1 week in the [place] [facility] and 2 weeks to see Dr. Johney Maine  Mild renal insuffiencey - creatinine 0.68 - 1.46 -  1.49 LFT's are up  - bilirubin is stable  Leukocytosis - 23K =>> 13.4 today  Poorly controlled insulin-dependent diabetes - A1C 7.6 Diabetic retinopathy/perpherial neuropathy  History of coronary artery disease/NSTEMI -status post stent 06/2011 -  OK for surgery per Cardiology - restart ASA 81 after surgery - Echo is pending Hyperlipidemia Hypertension -poorly controlled  FEN:  IV fluids/Clear diet =>> heart healthy, soft diet ID: rocephin 2/2 - day 3  WBC up =>> Zosyn 2/4 -patient needs the IV antibiotics for an additional 5 days through 09/26/17. DVT:  Lovenox Foley:  None Follow up DOW  Plan: As noted above Dr. Johney Maine would like to keep him on IV antibiotics for 5 days.  Keep the drain for 2 weeks.        LOS: 3 days    Lourie Retz 09/22/2017 [phone]

## 2017-09-22 NOTE — Anesthesia Postprocedure Evaluation (Signed)
Anesthesia Post Note  Patient: Derek Lowery  Procedure(s) Performed: LAPAROSCOPIC CHOLECYSTECTOMY WITH INTRAOPERATIVE CHOLANGIOGRAM (N/A )     Patient location during evaluation: PACU Anesthesia Type: General Level of consciousness: awake Pain management: pain level controlled Vital Signs Assessment: post-procedure vital signs reviewed and stable Respiratory status: spontaneous breathing Cardiovascular status: stable Postop Assessment: no apparent nausea or vomiting Anesthetic complications: no    Last Vitals:  Vitals:   09/22/17 0200 09/22/17 0529  BP: 106/63 119/70  Pulse: 78 84  Resp: 18 18  Temp: 36.8 C 36.6 C  SpO2: 92% 96%    Last Pain:  Vitals:   09/22/17 0529  TempSrc: Oral  PainSc:    Pain Goal: Patients Stated Pain Goal: 3 (09/21/17 0933)               Derek Lowery,Derek Lowery

## 2017-09-22 NOTE — Progress Notes (Signed)
Discharge planning, spoke with patient at beside. Chose [facility] for [facility] services, RN to teach and assist with drain care. Contacted [facility] for referral. [phone]

## 2017-09-22 NOTE — Evaluation (Signed)
Occupational Therapy Evaluation Patient Details Name: Derek Lowery MRN: [id number] DOB: 1956-03-18 Today's Date: 09/22/2017    History of Present Illness 62 yo male with history of insulin-dependent diabetes, coronary artery disease, status post MI 2012 who presents to the emergency department with a 2-day history of epigastric and right upper quadrant abdominal pain.  This is associated with nausea and vomiting. Dx of acute gangrenous cholecystitis, s/p lap chole 09/21/17.   Clinical Impression   Pt admitted with the above diagnosis and has the deficits listed below. Pt would benefit from cont OT to increase independence and efficiency with adls so he will be safe at home.  Pt has 24/7 assist if needed from his sister but overall is feeling weak and tired.  Energy conservation education needs to be completed but pt is doing well when given rest breaks.  Will continue to follow.    Follow Up Recommendations  Home health OT;Supervision/Assistance - 24 hour    Equipment Recommendations  Tub/shower bench    Recommendations for Other Services       Precautions / Restrictions Precautions Precautions: Other (comment)(drain) Precaution Comments: abdominal surgery, pt denies falls in past 1 year, poor vision Restrictions Weight Bearing Restrictions: No      Mobility Bed Mobility               General bed mobility comments: up in recliner  Transfers Overall transfer level: Needs assistance Equipment used: Rolling walker (2 wheeled) Transfers: Sit to/from Stand Sit to Stand: Min guard         General transfer comment: increased time, heavy reliance upon BUEs to push up on armrests of recliner    Balance Overall balance assessment: No apparent balance deficits (not formally assessed)                                         ADL either performed or assessed with clinical judgement   ADL Overall ADL's : Needs assistance/impaired Eating/Feeding:  Independent;Sitting   Grooming: Wash/dry hands;Wash/dry face;Oral care;Set up;Standing Grooming Details (indicate cue type and reason): Pt stood at sink for 4 minutes to groom.  One cue given for decreased vision (glaucoma) Upper Body Bathing: Set up;Sitting   Lower Body Bathing: Min guard;Sit to/from stand   Upper Body Dressing : Set up;Sitting   Lower Body Dressing: Min guard;Sit to/from stand   Toilet Transfer: Min guard;Comfort height toilet;Grab bars;RW Armed forces technical officer Details (indicate cue type and reason): Pt walked to bathroom with assist only for IV Toileting- Clothing Manipulation and Hygiene: Supervision/safety;Sit to/from stand   Tub/ Shower Transfer: Tub Product manager Details (indicate cue type and reason): discussed tub transfer.  pt would benefit from tub bench to prevent fall in bathroom. Functional mobility during ADLs: Supervision/safety;Rolling walker General ADL Comments: Pt able to reach feet and donn/doff socks. Pt does struggle at times and endurance for adls is low but he is able to complete tasks.     Vision Baseline Vision/History: Glaucoma Patient Visual Report: No change from baseline Vision Assessment?: Vision impaired- to be further tested in functional context Additional Comments: Pt's sister gives him drops for glaucoma.  His environment at home is set up so he knows where all things are most of the time.     Perception Perception Perception Tested?: No   Praxis      Pertinent Vitals/Pain Pain Assessment: 0-10 Pain Score: 3  Pain  Location: R lower abdomen Pain Descriptors / Indicators: Sore Pain Intervention(s): Monitored during session;Repositioned     Hand Dominance Right   Extremity/Trunk Assessment Upper Extremity Assessment Upper Extremity Assessment: Overall WFL for tasks assessed   Lower Extremity Assessment Lower Extremity Assessment: Defer to PT evaluation RLE Deficits / Details: strength +4/5 RLE Sensation:  history of peripheral neuropathy;decreased light touch LLE Deficits / Details: strength +4/5 LLE Sensation: history of peripheral neuropathy;decreased light touch   Cervical / Trunk Assessment Cervical / Trunk Assessment: Normal   Communication Communication Communication: No difficulties   Cognition Arousal/Alertness: Awake/alert Behavior During Therapy: WFL for tasks assessed/performed Overall Cognitive Status: Within Functional Limits for tasks assessed                                     General Comments  spoke with pt about drain and how to manage with clothing if he goes home with drain.    Exercises     Shoulder Instructions      Home Living Family/patient expects to be discharged to:: Private residence Living Arrangements: Other relatives Available Help at Discharge: Family;Available 24 hours/day Type of Home: House Home Access: Stairs to enter [employer] of Steps: 2 Entrance Stairs-Rails: None Home Layout: One level     Bathroom Shower/Tub: Tub/shower unit;Curtain   Biochemist, clinical: Handicapped height     Home Equipment: Cane - single point   Additional Comments: needs walker and will get tub bench on his own      Prior Functioning/Environment Level of Independence: Independent with assistive device(s)        Comments: walks with cane, no falls in past 1 year, independent with shower transfers but reports it's difficult, would like tub seat        OT Problem List: Decreased activity tolerance;Impaired vision/perception;Decreased knowledge of use of DME or AE;Pain      OT Treatment/Interventions: Self-care/ADL training;Energy conservation;Therapeutic activities    OT Goals(Current goals can be found in the care plan section) Acute Rehab OT Goals Patient Stated Goal: likes to watch Youtube videos of babies laughing OT Goal Formulation: With patient Time For Goal Achievement: 10/06/17 Potential to Achieve Goals: Good ADL  Goals Pt Will Perform Grooming: with modified independence;standing Pt Will Perform Tub/Shower Transfer: Tub transfer;with supervision;ambulating;tub bench;rolling walker Additional ADL Goal #1: Pt will gather clothes and dress self with mod I using walker to stand. Additional ADL Goal #2: Pt will state 3 things he can do at home to attempt to conserve energy during adls.  OT Frequency: Min 2X/week   Barriers to D/C:    pt has 24/7 assist.       Co-evaluation              AM-PAC PT "6 Clicks" Daily Activity     Outcome Measure Help from another person eating meals?: None Help from another person taking care of personal grooming?: None Help from another person toileting, which includes using toliet, bedpan, or urinal?: None Help from another person bathing (including washing, rinsing, drying)?: None Help from another person to put on and taking off regular upper body clothing?: None Help from another person to put on and taking off regular lower body clothing?: A Little 6 Click Score: 23   End of Session Equipment Utilized During Treatment: Rolling walker Nurse Communication: Mobility status  Activity Tolerance: Patient tolerated treatment well Patient left: in chair;with call bell/phone within reach  OT Visit Diagnosis: Low vision, both eyes (H54.2);Pain Pain - Right/Left: Right Pain - part of body: (abdomen)                Time: 1443-6016 OT Time Calculation (min): 22 min Charges:  OT General Charges $OT Visit: 1 Visit OT Evaluation $OT Eval Moderate Complexity: 1 Mod G-Codes:     Derek Lowery, OTR/L [phone]  Glenford Peers 09/22/2017, 11:47 AM

## 2017-09-22 NOTE — Care Management Important Message (Signed)
Important Message  Patient Details  Name: KAY RICCIUTI MRN: [id number] Date of Birth: Aug 05, 1956   Medicare Important Message Given:  Yes    Kerin Salen 09/22/2017, 10:46 AMImportant Message  Patient Details  Name: HITESH FOUCHE MRN: [id number] Date of Birth: 04-03-1956   Medicare Important Message Given:  Yes    Kerin Salen 09/22/2017, 10:46 AM

## 2017-09-22 NOTE — Progress Notes (Signed)
[facility]        CONSULT PROGRESS NOTE                                                                                                                                                                                                             Patient Demographics:    Derek Lowery, is a 61 y.o. male, DOB - March 28, 1956, GGE:[id number]  Admit date - 09/18/2017   Admitting Physician Md Edison Pace, MD  Outpatient Primary MD for the patient is Ladell Pier, MD  LOS - 3  Chief Complaint  Patient presents with  . Abdominal Pain       Brief Narrative  62 y/o male with PMH of HTN, DM, CAD s/p stent (2012), presented with progressively worsening epigastric, RUQ abdominal pains, nausea, vomiting and being admitted for cholecystectomy.  Hospitalist team was consulted to manage diabetes mellitus.    Subjective:   Patient in bed, appears comfortable, denies any headache, no fever, no chest pain or pressure, no shortness of breath , no abdominal pain. No focal weakness.   Assessment  & Plan :     1.  Acute gangrenous cholecystitis.  Currently on Zosyn, seen by general surgery and finally taken to the OR on 09/22/2017 for gallbladder removal, gangrenous gallbladder removed, JP drain left in place, recommendations is for 5 more days of antibiotics, likely can be transitioned to Augmentin if clinically better sooner than in 5 days.  Advance diet supportive care and monitor.   2.  DM type II in poor control.  History of diabetic retinopathy and peripheral neuropathy.  Placed on Lantus twice daily oral intake has improved and I have adjusted Lantus and sliding scale dose further on 09/22/2017 for better control.  Lab Results  Component Value Date   HGBA1C 7.6 06/09/2017   CBG (last 3)  Recent Labs    09/21/17 2243 09/22/17 0749 09/22/17 1154  GLUCAP 170* 202* 264*    3.  Hypokalemia.  Replaced and stable.  4.  CAD history.  No acute issues, continue home dose statin, resume aspirin after surgery  for secondary prevention.  5.  Essential hypertension.  Blood pressure soft, hold all scheduled blood pressure medications, hydrate and monitor.  6.  Dyslipidemia.  Continue home dose statin.  7.  Mild chronic diastolic CHF EF 46%.  Compensated.  8.  ARF.  ACE inhibitor held, medically appears dehydrated, IV fluid bolus and maintenance, bladder scan to rule out retention, monitor BMP.  9.  Right foot ulcer on the plantar aspect of  the first big toe.  No signs of infection, wound care.   Procedures :  TTE - Left ventricle: The cavity size was normal. Systolic function was  normal. The estimated ejection fraction was in the range of 60%  to 65%. Wall motion was normal; there were no regional wall motion abnormalities. Doppler parameters are consistent with both  elevated ventricular end-diastolic filling pressure and elevated   left atrial filling pressure.  Left atrium: The atrium was mildly dilated. Atrial septum: No defect or patent foramen ovale was identified.   Diet : Diet - low sodium heart healthy Diet heart healthy/carb modified Room service appropriate? Yes; Fluid consistency: Thin    Family Communication  :  None  Code Status :  Full  Disposition Plan  :  TBD   DVT Prophylaxis  :  Lovenox    Lab Results  Component Value Date   PLT 146 (L) 09/22/2017    Inpatient Medications  Scheduled Meds: . acetaminophen  1,000 mg Oral TID  . atorvastatin  40 mg Oral q1800  . dorzolamide  1 drop Right Eye BID  . enoxaparin (LOVENOX) injection  40 mg Subcutaneous Q24H  . feeding supplement (GLUCERNA SHAKE)  237 mL Oral BID BM  . gabapentin  300 mg Oral BID  . insulin aspart  0-9 Units Subcutaneous TID WC  . insulin glargine  8 Units Subcutaneous BID  . lip balm  1 application Topical BID  . psyllium  1 packet Oral BID  . timolol  1 drop Right Eye BID   Continuous Infusions: . lactated ringers    . piperacillin-tazobactam (ZOSYN)  IV 3.375 g (09/22/17 1004)  . sodium  chloride     PRN Meds:.acetaminophen **OR** [DISCONTINUED] acetaminophen, alum & mag hydroxide-simeth, bisacodyl, guaiFENesin-dextromethorphan, hydrALAZINE, hydrocortisone, hydrocortisone cream, magic mouthwash, menthol-cetylpyridinium, metoprolol tartrate, [DISCONTINUED] ondansetron **OR** ondansetron (ZOFRAN) IV, oxyCODONE, phenol  Antibiotics  :    Anti-infectives (From admission, onward)   Start     Dose/Rate Route Frequency Ordered Stop   09/21/17 1500  clindamycin (CLEOCIN) 900 mg, gentamicin (GARAMYCIN) 240 mg in sodium chloride 0.9 % 1,000 mL for intraperitoneal lavage      Intraperitoneal To Surgery 09/21/17 1427 09/21/17 1455   09/21/17 1030  piperacillin-tazobactam (ZOSYN) IVPB 3.375 g     3.375 g 12.5 mL/hr over 240 Minutes Intravenous Every 8 hours 09/21/17 0930     09/21/17 0000  amoxicillin-clavulanate (AUGMENTIN) 875-125 MG tablet     1 tablet Oral 2 times daily 09/21/17 1549     09/19/17 1200  cefTRIAXone (ROCEPHIN) 2 g in dextrose 5 % 50 mL IVPB  Status:  Discontinued     2 g 100 mL/hr over 30 Minutes Intravenous Every 24 hours 09/19/17 1120 09/21/17 0930         Objective:   Vitals:   09/21/17 2120 09/22/17 0200 09/22/17 0529 09/22/17 0953  BP: (!) 146/73 106/63 119/70 (!) 93/58  Pulse: 96 78 84 80  Resp: 18 18 18 20   Temp: 98.1 F (36.7 C) 98.3 F (36.8 C) 97.8 F (36.6 C) 98 F (36.7 C)  TempSrc: Oral Oral Oral Oral  SpO2: 98% 92% 96% 94%  Weight:        Wt Readings from Last 3 Encounters:  09/21/17 102 kg (224 lb 13.9 oz)  07/13/17 102.5 kg (226 lb)  06/09/17 98.5 kg (217 lb 3.2 oz)     Intake/Output Summary (Last 24 hours) at 09/22/2017 1159 Last data filed at 09/22/2017 0600 Gross per  24 hour  Intake 2946.25 ml  Output 930 ml  Net 2016.25 ml     Physical Exam  Awake Alert, Oriented X 3, No new F.N deficits, Normal affect [place].AT,PERRAL Supple Neck,No JVD, No cervical lymphadenopathy appriciated.  Symmetrical Chest wall movement, Good air  movement bilaterally, CTAB RRR,No Gallops, Rubs or new Murmurs, No Parasternal Heave +ve B.Sounds, Abd Soft, mild RUQ tenderness, right upper quadrant abdominal JP drain in place, no organomegaly appriciated, No rebound - guarding or rigidity. No Cyanosis, Clubbing or edema, No new Rash or bruise old healed ulcer base of the right first toe, no signs of cellulitis     Data Review:    CBC Recent Labs  Lab 09/19/17 0158 09/20/17 0516 09/21/17 0425 09/22/17 0516  WBC 12.7* 22.1* 23.0* 13.4*  HGB 13.5 13.7 12.6* 11.7*  HCT 39.0 39.9 38.0* 35.6*  PLT 172 155 162 146*  MCV 84.1 84.5 87.0 86.8  MCH 29.1 29.0 28.8 28.5  MCHC 34.6 34.3 33.2 32.9  RDW 13.0 13.2 13.6 13.3  LYMPHSABS 1.2  --   --   --   MONOABS 1.1*  --   --   --   EOSABS 0.0  --   --   --   BASOSABS 0.0  --   --   --     Chemistries  Recent Labs  Lab 09/19/17 0158 09/20/17 0516 09/21/17 0425 09/22/17 0516  NA 138 138 139 135  K 3.2* 3.1* 3.9 3.5  CL 101 103 106 104  CO2 26 25 26 23   GLUCOSE 207* 241* 169* 212*  BUN 17 21* 39* 53*  CREATININE 0.70 0.68 1.46* 1.49*  CALCIUM 9.4 8.4* 7.8* 7.0*  MG  --   --  1.9  --   AST 23 53* 181* 199*  ALT 18 45 171* 199*  ALKPHOS 70 81 84 92  BILITOT 1.1 1.5* 1.3* 1.1   ------------------------------------------------------------------------------------------------------------------ No results for input(s): CHOL, HDL, LDLCALC, TRIG, CHOLHDL, LDLDIRECT in the last 72 hours.  Lab Results  Component Value Date   HGBA1C 7.6 06/09/2017   ------------------------------------------------------------------------------------------------------------------ No results for input(s): TSH, T4TOTAL, T3FREE, THYROIDAB in the last 72 hours.  Invalid input(s): FREET3 ------------------------------------------------------------------------------------------------------------------ No results for input(s): VITAMINB12, FOLATE, FERRITIN, TIBC, IRON, RETICCTPCT in the last 72  hours.  Coagulation profile No results for input(s): INR, PROTIME in the last 168 hours.  No results for input(s): DDIMER in the last 72 hours.  Cardiac Enzymes No results for input(s): CKMB, TROPONINI, MYOGLOBIN in the last 168 hours.  Invalid input(s): CK ------------------------------------------------------------------------------------------------------------------    Component Value Date/Time   BNP 80.1 09/21/2017 0743    Micro Results Recent Results (from the past 240 hour(s))  Blood culture (routine x 2)     Status: None (Preliminary result)   Collection Time: 09/19/17  6:44 AM  Result Value Ref Range Status   Specimen Description   Final    BLOOD LEFT HAND Performed at [facility] [address], [place], [place] [address]    Special Requests   Final    BOTTLES DRAWN AEROBIC AND ANAEROBIC Blood Culture adequate volume Performed at [facility] [address], [place], [place] [address]    Culture   Final    NO GROWTH 2 DAYS Performed at [facility] [address], [place], [place] [address]    Report Status PENDING  Incomplete  Blood culture (routine x 2)     Status: None (Preliminary result)  Collection Time: 09/19/17  6:51 AM  Result Value Ref Range Status   Specimen Description   Final    BLOOD RIGHT HAND Performed at [facility], [place] [address] [place], [place] [address]    Special Requests   Final    BOTTLES DRAWN AEROBIC AND ANAEROBIC Blood Culture adequate volume Performed at [facility] [address], [place], [place] [address]    Culture   Final    NO GROWTH 2 DAYS Performed at [facility] [address], [place], [place] [address]    Report Status PENDING  Incomplete  MRSA PCR Screening     Status: None   Collection Time: 09/20/17  8:05 AM  Result Value Ref Range Status   MRSA by PCR NEGATIVE NEGATIVE Final    Comment:        The GeneXpert  MRSA Assay (FDA approved for NASAL specimens only), is one component of a comprehensive MRSA colonization surveillance program. It is not intended to diagnose MRSA infection nor to guide or monitor treatment for MRSA infections. Performed at [facility], [place] [address] [place],  [address]     Radiology Reports Ct Abdomen Pelvis W Contrast  Result Date: 09/19/2017 CLINICAL DATA:  Abdominal distention. Nausea and vomiting. No bowel movement in 2 days. EXAM: CT ABDOMEN AND PELVIS WITH CONTRAST TECHNIQUE: Multidetector CT imaging of the abdomen and pelvis was performed using the standard protocol following bolus administration of intravenous contrast. CONTRAST:  183m ISOVUE-300 IOPAMIDOL (ISOVUE-300) INJECTION 61% COMPARISON:  Ultrasound right upper quadrant 09/19/2017. Abdominal series 09/19/2017. CT pelvis 05/05/2014 FINDINGS: Lower chest: Mild dependent changes in the lung bases. Hepatobiliary: Gallbladder is distended. No wall thickening. No focal stones identified. No bile duct dilatation. No focal liver lesions. Pancreas: Unremarkable. No pancreatic ductal dilatation or surrounding inflammatory changes. Spleen: Normal in size without focal abnormality. Adrenals/Urinary Tract: Adrenal glands are unremarkable. Kidneys are normal, without renal calculi, focal lesion, or hydronephrosis. Bladder is unremarkable. Stomach/Bowel: Stomach, small bowel, and colon are mostly decompressed. Scattered stool within the colon. No wall thickening or inflammatory changes identified. Appendix is normal. Vascular/Lymphatic: Aortic atherosclerosis. No enlarged abdominal or pelvic lymph nodes. Reproductive: Prostate is unremarkable. Calcification of the vas deferens. Other: No free air or free fluid in the abdomen. Abdominal wall musculature appears intact. No mesenteric edema. Musculoskeletal: Mild degenerative changes in the spine and hips. No destructive bone lesions. IMPRESSION: 1.  Distended gallbladder without wall thickening, stone, or infiltration. 2. Aortic atherosclerosis. 3. No bowel obstruction or inflammation. Electronically Signed   By: WLucienne CapersM.D.   On: 09/19/2017 06:19   [place]Renal  Result Date: 09/21/2017 CLINICAL DATA:  Acute renal failure EXAM: RENAL / URINARY TRACT ULTRASOUND COMPLETE COMPARISON:  CT abdomen and pelvis September 19, 2017 FINDINGS: Right Kidney: Length: 11.4 cm. Echogenicity and renal cortical thickness are within normal limits. No mass, perinephric fluid, or hydronephrosis visualized. No sonographically demonstrable calculus or ureterectasis. Left Kidney: Length: 11.4 cm. Echogenicity and renal cortical thickness are within normal limits. No mass, perinephric fluid, or hydronephrosis visualized. No sonographically demonstrable calculus or ureterectasis. Bladder: Appears normal for degree of bladder distention. IMPRESSION: Study within normal limits. Electronically Signed   By: WLowella GripIII M.D.   On: 09/21/2017 19:22   [place]oreaAbdomen Limited  Result Date: 09/19/2017 CLINICAL DATA:  Right upper quadrant pain, epigastric pain, and vomiting. EXAM: ULTRASOUND ABDOMEN LIMITED RIGHT UPPER QUADRANT COMPARISON:  None. FINDINGS: Gallbladder: Gallbladder is distended and  filled with echogenic material consistent with sludge and/or small stones. No gallbladder wall thickening. Murphy's sign is negative. Common bile duct: Diameter: 4 mm, normal Liver: No focal lesion identified. Within normal limits in parenchymal echogenicity. Portal vein is patent on color Doppler imaging with normal direction of blood flow towards the liver. IMPRESSION: Distended gallbladder filled with echogenic material consistent with sludge or small stones. No additional changes to suggest cholecystitis. Electronically Signed   By: Lucienne Capers M.D.   On: 09/19/2017 05:15   Dg Chest Port 1 View  Result Date: 09/21/2017 CLINICAL DATA:  Shortness of Breath EXAM: PORTABLE CHEST 1  VIEW COMPARISON:  09/19/2017 FINDINGS: Cardiac shadow is accentuated by the portable technique. Lungs are hypoinflated with mild bibasilar atelectatic change. No bony abnormality is noted. IMPRESSION: Poor inspiratory effort with bibasilar atelectatic change. Electronically Signed   By: Inez Catalina M.D.   On: 09/21/2017 07:56   Dg Abdomen Acute W/chest  Result Date: 09/19/2017 CLINICAL DATA:  Upper abdominal pain and nausea. No bowel movement over 2 days. EXAM: DG ABDOMEN ACUTE W/ 1V CHEST COMPARISON:  Chest 05/05/2014 FINDINGS: Normal heart size and pulmonary vascularity. No focal airspace disease or consolidation in the lungs. No blunting of costophrenic angles. No pneumothorax. Mediastinal contours appear intact. Gas and stool throughout the colon. Hepatic flexure of the colon is focally no small or large bowel distention. No free intra-abdominal air. No abnormal air-fluid levels. No radiopaque stones. Calcification in the vas deferens. Displaced suggesting an enlarged gallbladder. IMPRESSION: No evidence of active pulmonary disease. Right upper quadrant soft tissues suggest an enlarged gallbladder. Nonobstructive bowel gas pattern. Electronically Signed   By: Lucienne Capers M.D.   On: 09/19/2017 03:40    Time Spent in minutes  30   Lala Lund M.D on 09/22/2017 at 11:59 AM  Between 7am to 7pm - Pager - [phone] ( page via [email or web address], text pages only, please mention full 10 digit call back number). After 7pm go to [email or web address] - password [facility]

## 2017-09-22 NOTE — Evaluation (Signed)
Physical Therapy Evaluation Patient Details Name: COLLEN VINCENT MRN: [id number] DOB: 06-06-1956 Today's Date: 09/22/2017   History of Present Illness  62 yo male with history of insulin-dependent diabetes, coronary artery disease, status post MI 2012 who presents to the emergency department with a 2-day history of epigastric and right upper quadrant abdominal pain.  This is associated with nausea and vomiting. Dx of acute gangrenous cholecystitis, s/p lap chole 09/21/17.  Clinical Impression  Pt is mobilizing well, he ambulated 400' with RW in hall with no loss of balance. Encouraged pt to walk frequently. RW recommended for home. No further PT indicated as pt is modified independent with mobility. He would like a seat for his shower, he plans to purchase this privately.     Follow Up Recommendations No PT follow up    Equipment Recommendations  Rolling walker with 5" wheels    Recommendations for Other Services       Precautions / Restrictions Precautions Precautions: Other (comment) Precaution Comments: abdominal surgery, pt denies falls in past 1 year, poor vision Restrictions Weight Bearing Restrictions: No      Mobility  Bed Mobility               General bed mobility comments: up in recliner  Transfers Overall transfer level: Needs assistance Equipment used: None Transfers: Sit to/from Stand Sit to Stand: Min guard         General transfer comment: increased time, heavy reliance upon BUEs to push up on armrests of recliner  Ambulation/Gait Ambulation/Gait assistance: Modified independent (Device/Increase time) Ambulation Distance (Feet): 400 Feet Assistive device: Rolling walker (2 wheeled) Gait Pattern/deviations: Trunk flexed;Step-through pattern   Gait velocity interpretation: at or above normal speed for age/gender General Gait Details: VCs to correct posture, steady with no LOB  Stairs            Wheelchair Mobility    Modified Rankin  (Stroke Patients Only)       Balance Overall balance assessment: Modified Independent                                           Pertinent Vitals/Pain Pain Assessment: 0-10 Pain Score: 3  Pain Location: R lower abdomen Pain Descriptors / Indicators: Sore Pain Intervention(s): Limited activity within patient's tolerance;Monitored during session    Home Living Family/patient expects to be discharged to:: Private residence Living Arrangements: Other relatives(brother and sister in law) Available Help at Discharge: Family;Available 24 hours/day   Home Access: Stairs to enter   Entrance Stairs-Number of Steps: 2 Home Layout: One level Home Equipment: Cane - single point      Prior Function Level of Independence: Independent with assistive device(s)         Comments: walks with cane, no falls in past 1 year, independent with shower transfers but reports it's difficult, would like tub seat     Hand Dominance        Extremity/Trunk Assessment        Lower Extremity Assessment Lower Extremity Assessment: RLE deficits/detail;LLE deficits/detail RLE Deficits / Details: strength +4/5 RLE Sensation: history of peripheral neuropathy;decreased light touch LLE Deficits / Details: strength +4/5 LLE Sensation: history of peripheral neuropathy;decreased light touch       Communication   Communication: No difficulties  Cognition Arousal/Alertness: Awake/alert Behavior During Therapy: WFL for tasks assessed/performed Overall Cognitive Status: Within Functional Limits for tasks assessed  General Comments      Exercises     Assessment/Plan    PT Assessment Patent does not need any further PT services  PT Problem List         PT Treatment Interventions      PT Goals (Current goals can be found in the Care Plan section)  Acute Rehab PT Goals Patient Stated Goal: likes to watch Youtube videos  of babies laughing PT Goal Formulation: All assessment and education complete, DC therapy    Frequency     Barriers to discharge        Co-evaluation               AM-PAC PT "6 Clicks" Daily Activity  Outcome Measure Difficulty turning over in bed (including adjusting bedclothes, sheets and blankets)?: None Difficulty moving from lying on back to sitting on the side of the bed? : None Difficulty sitting down on and standing up from a chair with arms (e.g., wheelchair, bedside commode, etc,.)?: A Little Help needed moving to and from a bed to chair (including a wheelchair)?: None Help needed walking in hospital room?: None Help needed climbing 3-5 steps with a railing? : None 6 Click Score: 23    End of Session Equipment Utilized During Treatment: Gait belt Activity Tolerance: Patient tolerated treatment well Patient left: in chair;with call bell/phone within reach Nurse Communication: Mobility status      Time: 1010-1039 PT Time Calculation (min) (ACUTE ONLY): 29 min   Charges:   PT Evaluation $PT Eval Low Complexity: 1 Low PT Treatments $Gait Training: 8-22 mins   PT G Codes:          Philomena Doheny 09/22/2017, 10:45 AM [phone]

## 2017-09-23 DIAGNOSIS — N179 Acute kidney failure, unspecified: Secondary | ICD-10-CM

## 2017-09-23 DIAGNOSIS — E1142 Type 2 diabetes mellitus with diabetic polyneuropathy: Secondary | ICD-10-CM

## 2017-09-23 LAB — COMPREHENSIVE METABOLIC PANEL
ALBUMIN: 2.4 g/dL — AB (ref 3.5–5.0)
ALT: 133 U/L — AB (ref 17–63)
AST: 99 U/L — AB (ref 15–41)
Alkaline Phosphatase: 78 U/L (ref 38–126)
Anion gap: 6 (ref 5–15)
BUN: 54 mg/dL — AB (ref 6–20)
CHLORIDE: 106 mmol/L (ref 101–111)
CO2: 24 mmol/L (ref 22–32)
CREATININE: 1.41 mg/dL — AB (ref 0.61–1.24)
Calcium: 7 mg/dL — ABNORMAL LOW (ref 8.9–10.3)
GFR calc Af Amer: >60 mL/min (ref >60)
GFR, EST NON AFRICAN AMERICAN: 53 mL/min — AB (ref >60)
GLUCOSE: 200 mg/dL — AB (ref 65–99)
POTASSIUM: 3.6 mmol/L (ref 3.5–5.1)
SODIUM: 136 mmol/L (ref 135–145)
Total Bilirubin: 0.7 mg/dL (ref 0.3–1.2)
Total Protein: 5.2 g/dL — ABNORMAL LOW (ref 6.5–8.1)

## 2017-09-23 LAB — MAGNESIUM: MAGNESIUM: 2 mg/dL (ref 1.7–2.4)

## 2017-09-23 LAB — GLUCOSE, CAPILLARY
GLUCOSE-CAPILLARY: 232 mg/dL — AB (ref 65–99)
GLUCOSE-CAPILLARY: 163 mg/dL — AB (ref 65–99)
Glucose-Capillary: 171 mg/dL — ABNORMAL HIGH (ref 65–99)
Glucose-Capillary: 259 mg/dL — ABNORMAL HIGH (ref 65–99)

## 2017-09-23 LAB — CBC
HCT: 30.6 % — ABNORMAL LOW (ref 39.0–52.0)
Hemoglobin: 10.5 g/dL — ABNORMAL LOW (ref 13.0–17.0)
MCH: 28.8 pg (ref 26.0–34.0)
MCHC: 34.3 g/dL (ref 30.0–36.0)
MCV: 83.8 fL (ref 78.0–100.0)
PLATELETS: 156 10*3/uL (ref 150–400)
RBC: 3.65 MIL/uL — AB (ref 4.22–5.81)
RDW: 13.2 % (ref 11.5–15.5)
WBC: 10.7 10*3/uL — AB (ref 4.0–10.5)

## 2017-09-23 MED ORDER — SODIUM CHLORIDE 0.9 % IV SOLN
INTRAVENOUS | Status: DC
  Administered 2017-09-23: 15:00:00 via INTRAVENOUS

## 2017-09-23 MED ORDER — SACCHAROMYCES BOULARDII 250 MG PO CAPS
250.0000 mg | ORAL_CAPSULE | Freq: Two times a day (BID) | ORAL | Status: DC
  Administered 2017-09-23 – 2017-10-01 (×16): 250 mg via ORAL
  Filled 2017-09-23 (×16): qty 1

## 2017-09-23 MED ORDER — INSULIN ASPART 100 UNIT/ML ~~LOC~~ SOLN
3.0000 [IU] | Freq: Three times a day (TID) | SUBCUTANEOUS | Status: DC
  Administered 2017-09-23 – 2017-09-28 (×8): 3 [IU] via SUBCUTANEOUS

## 2017-09-23 MED ORDER — SODIUM CHLORIDE 0.9 % IV SOLN
INTRAVENOUS | Status: DC
  Filled 2017-09-23 (×2): qty 1000

## 2017-09-23 MED ORDER — HYDRALAZINE HCL 20 MG/ML IJ SOLN
10.0000 mg | Freq: Three times a day (TID) | INTRAMUSCULAR | Status: DC | PRN
  Administered 2017-09-24 (×2): 10 mg via INTRAVENOUS
  Filled 2017-09-23 (×3): qty 1

## 2017-09-23 MED ORDER — AMLODIPINE BESYLATE 5 MG PO TABS
5.0000 mg | ORAL_TABLET | Freq: Every day | ORAL | Status: DC
  Administered 2017-09-23 – 2017-09-27 (×5): 5 mg via ORAL
  Filled 2017-09-23 (×5): qty 1

## 2017-09-23 MED ORDER — DOCUSATE SODIUM 100 MG PO CAPS
100.0000 mg | ORAL_CAPSULE | Freq: Two times a day (BID) | ORAL | Status: DC
  Administered 2017-09-23: 100 mg via ORAL
  Filled 2017-09-23: qty 1

## 2017-09-23 MED ORDER — SIMETHICONE 80 MG PO CHEW
160.0000 mg | CHEWABLE_TABLET | Freq: Four times a day (QID) | ORAL | Status: AC
  Administered 2017-09-24 – 2017-09-25 (×7): 160 mg via ORAL
  Filled 2017-09-23 (×8): qty 2

## 2017-09-23 MED ORDER — POLYETHYLENE GLYCOL 3350 17 G PO PACK
17.0000 g | PACK | Freq: Every day | ORAL | Status: DC
  Administered 2017-09-23: 17 g via ORAL
  Filled 2017-09-23: qty 1

## 2017-09-23 MED ORDER — POTASSIUM CHLORIDE CRYS ER 20 MEQ PO TBCR
40.0000 meq | EXTENDED_RELEASE_TABLET | Freq: Once | ORAL | Status: AC
Start: 2017-09-23 — End: 2017-09-23
  Administered 2017-09-23: 40 meq via ORAL
  Filled 2017-09-23: qty 2

## 2017-09-23 MED ORDER — PSYLLIUM 95 % PO PACK
1.0000 | PACK | Freq: Two times a day (BID) | ORAL | Status: DC
  Administered 2017-09-24 – 2017-09-25 (×3): 1 via ORAL
  Filled 2017-09-23 (×8): qty 1

## 2017-09-23 MED ORDER — SODIUM CHLORIDE 0.9 % IV BOLUS (SEPSIS)
500.0000 mL | Freq: Once | INTRAVENOUS | Status: AC
  Administered 2017-09-23: 500 mL via INTRAVENOUS

## 2017-09-23 NOTE — Progress Notes (Signed)
PROGRESS NOTE    Derek Lowery  WUX:[id number] DOB: Nov 16, 1955 DOA: 09/18/2017 PCP: Ladell Pier, MD   Brief Narrative:   62 y/o male with PMH of HTN, DM, CAD s/p stent (2012), presented with progressively worsening epigastric, RUQ abdominal pains, nausea, vomiting and being admitted for cholecystectomy.  Hospitalist team was consulted to manage diabetes mellitus.      Assessment & Plan:   Principal Problem:   Acute gangrenous cholecystitis s/p lap cholecystectomy 09/21/2017 Active Problems:   CAD in native artery   Essential hypertension   Diabetic polyneuropathy associated with type 2 diabetes mellitus (HCC)   Retinopathy due to secondary diabetes ([place])  1 acute gangrenous cholecystitis Patient was initially admitted under the general surgical team for cholecystectomy secondary to acute cholecystitis.  Patient status post laparoscopic cholecystectomy per Dr. Johney Maine 09/21/2017, was taken to the operating room.  JP drain left in place.  Continue IV Zosyn and transition to oral Augmentin to complete 5 more days of antibiotic treatment per general surgical recommendations.  Per general surgery.  2.  Diabetes mellitus type 2 poorly controlled with diabetic retinopathy and peripheral neuropathy Hemoglobin A1c was 7.6 on 06/09/2017.  Repeat hemoglobin A1c.  CBGs have ranged from 171-259.  Continue Lantus 12 units twice daily.  Continue sliding scale insulin.  Add NovoLog 3 units 3 times daily with meals for meal coverage.  Outpatient follow-up.  3.  Hypertension Patient noted to be hypotensive earlier 1.  Antihypertensive medications on hold.  IV fluids have been discontinued.  Start Norvasc 5 mg daily.  Hydralazine as needed.  4.  Right bundle branch block Patient has been seen by cardiology patient noted to have no cardiac issues or arrhythmias during the hospitalization.  2D echo done was normal.  Cardiology.  5.  Hypokalemia  Repleted  6.  Hyperlipidemia Continue  statin.  7.  Mild chronic diastolic CHF Compensated.  8.  Acute renal failure Initially felt to be secondary to dehydration.  ACE inhibitor discontinued.  Patient given IV fluid bolus and IV fluids.  Renal ultrasound negative for hydronephrosis.  Check daily weights.  Strict I's and O's.  Follow.  9.  Right foot ulcer on plantar aspect of first big toe Stable.  Continue wound care.  No signs of infection.    DVT prophylaxis: Lovenox Code Status: Full Family Communication: Updated patient.  No family at bedside. Disposition Plan: Home once acute renal failure/acute kidney injury has improved and when okay with general surgery.   Consultants:   Cardiology: Dr. Skeet Latch 09/20/2017  [facility] hospitalist: Dr. Daleen Bo 09/19/2017  Procedures:  CT abdomen and pelvis 09/19/2017  Chest x-ray 09/21/2017  Acute abdominal series 09/19/2017  Renal ultrasound 09/21/2017  Right upper quadrant abdominal ultrasound 09/19/2017  2D echo 09/21/2017  Laparoscopic cholecystectomy per Dr. Johney Maine 09/21/2017  Antimicrobials:   IV Rocephin 09/19/2017>>>>> 09/21/2017  IV Zosyn 09/21/2017     Subjective: Passing gas.  Patient having bowel movement.  No emesis.  Some nausea.  Tolerating current diet.  Objective: Vitals:   09/22/17 1605 09/22/17 1836 09/22/17 2240 09/23/17 0630  BP: 114/65 (!) 94/54 114/69 128/77  Pulse: 71 69 63 (!) 58  Resp: 18 18 18 16   Temp: 98.4 F (36.9 C) 98.4 F (36.9 C) 97.8 F (36.6 C) 97.7 F (36.5 C)  TempSrc: Oral Oral Oral Oral  SpO2: 96% 97% 97% 97%  Weight:        Intake/Output Summary (Last 24 hours) at 09/23/2017 1328 Last data filed at 09/23/2017  1000 Gross per 24 hour  Intake 2900 ml  Output 1401 ml  Net 1499 ml   Filed Weights   09/21/17 1404  Weight: 102 kg (224 lb 13.9 oz)    Examination:  General exam: Appears calm and comfortable  Respiratory system: Clear to auscultation. Respiratory effort normal. Cardiovascular system: S1 & S2 heard, RRR. No  JVD, murmurs, rubs, gallops or clicks. No pedal edema. Gastrointestinal system: Abdomen is markedly distended, soft, nontender to palpation, positive bowel sounds.  JP drain in place and draining. Central nervous system: Alert and oriented. No focal neurological deficits. Extremities: Symmetric 5 x 5 power. Skin: No rashes, lesions or ulcers Psychiatry: Judgement and insight appear normal. Mood & affect appropriate.     Data Reviewed: I have personally reviewed following labs and imaging studies  CBC: Recent Labs  Lab 09/19/17 0158 09/20/17 0516 09/21/17 0425 09/22/17 0516 09/23/17 0508  WBC 12.7* 22.1* 23.0* 13.4* 10.7*  NEUTROABS 10.3*  --   --   --   --   HGB 13.5 13.7 12.6* 11.7* 10.5*  HCT 39.0 39.9 38.0* 35.6* 30.6*  MCV 84.1 84.5 87.0 86.8 83.8  PLT 172 155 162 146* 088   Basic Metabolic Panel: Recent Labs  Lab 09/19/17 0158 09/20/17 0516 09/21/17 0425 09/22/17 0516 09/23/17 0508  NA 138 138 139 135 136  K 3.2* 3.1* 3.9 3.5 3.6  CL 101 103 106 104 106  CO2 26 25 26 23 24   GLUCOSE 207* 241* 169* 212* 200*  BUN 17 21* 39* 53* 54*  CREATININE 0.70 0.68 1.46* 1.49* 1.41*  CALCIUM 9.4 8.4* 7.8* 7.0* 7.0*  MG  --   --  1.9  --  2.0   GFR: Estimated Creatinine Clearance: 67.5 mL/min (A) (by C-G formula based on SCr of 1.41 mg/dL (H)). Liver Function Tests: Recent Labs  Lab 09/19/17 0158 09/20/17 0516 09/21/17 0425 09/22/17 0516 09/23/17 0508  AST 23 53* 181* 199* 99*  ALT 18 45 171* 199* 133*  ALKPHOS 70 81 84 92 78  BILITOT 1.1 1.5* 1.3* 1.1 0.7  PROT 7.7 7.1 6.3* 5.4* 5.2*  ALBUMIN 4.1 3.5 2.8* 2.5* 2.4*   Recent Labs  Lab 09/19/17 0158 09/22/17 0516  LIPASE 23 20   No results for input(s): AMMONIA in the last 168 hours. Coagulation Profile: No results for input(s): INR, PROTIME in the last 168 hours. Cardiac Enzymes: No results for input(s): CKTOTAL, CKMB, CKMBINDEX, TROPONINI in the last 168 hours. BNP (last 3 results) No results for  input(s): PROBNP in the last 8760 hours. HbA1C: No results for input(s): HGBA1C in the last 72 hours. CBG: Recent Labs  Lab 09/22/17 1154 09/22/17 1751 09/22/17 2120 09/23/17 0736 09/23/17 1216  GLUCAP 264* 204* 231* 171* 259*   Lipid Profile: No results for input(s): CHOL, HDL, LDLCALC, TRIG, CHOLHDL, LDLDIRECT in the last 72 hours. Thyroid Function Tests: No results for input(s): TSH, T4TOTAL, FREET4, T3FREE, THYROIDAB in the last 72 hours. Anemia Panel: No results for input(s): VITAMINB12, FOLATE, FERRITIN, TIBC, IRON, RETICCTPCT in the last 72 hours. Sepsis Labs: Recent Labs  Lab 09/19/17 0545  LATICACIDVEN 0.96    Recent Results (from the past 240 hour(s))  Blood culture (routine x 2)     Status: None (Preliminary result)   Collection Time: 09/19/17  6:44 AM  Result Value Ref Range Status   Specimen Description   Final    BLOOD LEFT HAND Performed at [facility] [address], [place], [place] [address]  Special Requests   Final    BOTTLES DRAWN AEROBIC AND ANAEROBIC Blood Culture adequate volume Performed at [facility] [address], [place], [place] [address]    Culture   Final    NO GROWTH 3 DAYS Performed at [facility], [place] [address], [place], [place] [address]    Report Status PENDING  Incomplete  Blood culture (routine x 2)     Status: None (Preliminary result)   Collection Time: 09/19/17  6:51 AM  Result Value Ref Range Status   Specimen Description   Final    BLOOD RIGHT HAND Performed at [facility] [address] [place], [place] [address]    Special Requests   Final    BOTTLES DRAWN AEROBIC AND ANAEROBIC Blood Culture adequate volume Performed at [facility] [address], [place], [place] [address]    Culture   Final    NO GROWTH 3 DAYS Performed at [facility], [place] [address], [place], [place] [address]    Report Status PENDING   Incomplete  MRSA PCR Screening     Status: None   Collection Time: 09/20/17  8:05 AM  Result Value Ref Range Status   MRSA by PCR NEGATIVE NEGATIVE Final    Comment:        The GeneXpert MRSA Assay (FDA approved for NASAL specimens only), is one component of a comprehensive MRSA colonization surveillance program. It is not intended to diagnose MRSA infection nor to guide or monitor treatment for MRSA infections. Performed at [facility], [place] [address], [place], [place] [address]          Radiology Studies: US Renal  Result Date: 09/21/2017 CLINICAL DATA:  Acute renal failure EXAM: RENAL / URINARY TRACT ULTRASOUND COMPLETE COMPARISON:  CT abdomen and pelvis September 19, 2017 FINDINGS: Right Kidney: Length: 11.4 cm. Echogenicity and renal cortical thickness are within normal limits. No mass, perinephric fluid, or hydronephrosis visualized. No sonographically demonstrable calculus or ureterectasis. Left Kidney: Length: 11.4 cm. Echogenicity and renal cortical thickness are within normal limits. No mass, perinephric fluid, or hydronephrosis visualized. No sonographically demonstrable calculus or ureterectasis. Bladder: Appears normal for degree of bladder distention. IMPRESSION: Study within normal limits. Electronically Signed   By: Lowella Grip III M.D.   On: 09/21/2017 19:22        Scheduled Meds: . acetaminophen  1,000 mg Oral TID  . aspirin  81 mg Oral Daily  . atorvastatin  40 mg Oral q1800  . dorzolamide  1 drop Right Eye BID  . enoxaparin (LOVENOX) injection  40 mg Subcutaneous Q24H  . feeding supplement (GLUCERNA SHAKE)  237 mL Oral BID BM  . gabapentin  300 mg Oral BID  . insulin aspart  0-5 Units Subcutaneous QHS  . insulin aspart  0-9 Units Subcutaneous TID WC  . insulin aspart  3 Units Subcutaneous TID WC  . insulin glargine  12 Units Subcutaneous BID  . lip balm  1 application Topical BID  . psyllium  1 packet Oral BID  . timolol  1  drop Right Eye BID   Continuous Infusions: . piperacillin-tazobactam (ZOSYN)  IV 3.375 g (09/23/17 1034)  . sodium chloride 0.9 % 1,000 mL infusion    . sodium chloride       LOS: 4 days    Time spent: 43 Minutes    Irine Seal, MD [facility] Pager [phone]  If 7PM-7AM, please contact night-coverage  [email or web address] Password [email or web address] 09/23/2017, 1:28 PM

## 2017-09-23 NOTE — Progress Notes (Signed)
Subjective:  Denies SSCP, palpitations or Dyspnea Taking PO BP better   Objective:  Vitals:   09/22/17 1605 09/22/17 1836 09/22/17 2240 09/23/17 0630  BP: 114/65 (!) 94/54 114/69 128/77  Pulse: 71 69 63 (!) 58  Resp: 18 18 18 16   Temp: 98.4 F (36.9 C) 98.4 F (36.9 C) 97.8 F (36.6 C) 97.7 F (36.5 C)  TempSrc: Oral Oral Oral Oral  SpO2: 96% 97% 97% 97%  Weight:        Intake/Output from previous day:  Intake/Output Summary (Last 24 hours) at 09/23/2017 2595 Last data filed at 09/23/2017 0600 Gross per 24 hour  Intake 2540 ml  Output 1416 ml  Net 1124 ml    Physical Exam: Affect appropriate Disheveled white male  HEENT: normal Neck supple with no adenopathy JVP normal no bruits no thyromegaly Lungs clear with no wheezing and good diaphragmatic motion Heart:  S1/S2 no murmur, no rub, gallop or click PMI normal Abdomen: post lap choly with JP drain in place  no bruit.  No HSM or HJR Distal pulses intact with no bruits No edema Neuro non-focal Skin warm and dry No muscular weakness   Lab Results: Basic Metabolic Panel: Recent Labs    09/21/17 0425 09/22/17 0516 09/23/17 0508  NA 139 135 136  K 3.9 3.5 3.6  CL 106 104 106  CO2 26 23 24   GLUCOSE 169* 212* 200*  BUN 39* 53* 54*  CREATININE 1.46* 1.49* 1.41*  CALCIUM 7.8* 7.0* 7.0*  MG 1.9  --   --    Liver Function Tests: Recent Labs    09/22/17 0516 09/23/17 0508  AST 199* 99*  ALT 199* 133*  ALKPHOS 92 78  BILITOT 1.1 0.7  PROT 5.4* 5.2*  ALBUMIN 2.5* 2.4*   Recent Labs    09/22/17 0516  LIPASE 20   CBC: Recent Labs    09/22/17 0516 09/23/17 0508  WBC 13.4* 10.7*  HGB 11.7* 10.5*  HCT 35.6* 30.6*  MCV 86.8 83.8  PLT 146* 156   Cardiac Enzymes: No results for input(s): CKTOTAL, CKMB, CKMBINDEX, TROPONINI in the last 72 hours. BNP: Invalid input(s): POCBNP D-Dimer: No results for input(s): DDIMER in the last 72 hours. Hemoglobin A1C: No results for input(s): HGBA1C in  the last 72 hours. Fasting Lipid Panel: No results for input(s): CHOL, HDL, LDLCALC, TRIG, CHOLHDL, LDLDIRECT in the last 72 hours. Thyroid Function Tests: No results for input(s): TSH, T4TOTAL, T3FREE, THYROIDAB in the last 72 hours.  Invalid input(s): FREET3 Anemia Panel: No results for input(s): VITAMINB12, FOLATE, FERRITIN, TIBC, IRON, RETICCTPCT in the last 72 hours.  Imaging: US Renal  Result Date: 09/21/2017 CLINICAL DATA:  Acute renal failure EXAM: RENAL / URINARY TRACT ULTRASOUND COMPLETE COMPARISON:  CT abdomen and pelvis September 19, 2017 FINDINGS: Right Kidney: Length: 11.4 cm. Echogenicity and renal cortical thickness are within normal limits. No mass, perinephric fluid, or hydronephrosis visualized. No sonographically demonstrable calculus or ureterectasis. Left Kidney: Length: 11.4 cm. Echogenicity and renal cortical thickness are within normal limits. No mass, perinephric fluid, or hydronephrosis visualized. No sonographically demonstrable calculus or ureterectasis. Bladder: Appears normal for degree of bladder distention. IMPRESSION: Study within normal limits. Electronically Signed   By: Lowella Grip III M.D.   On: 09/21/2017 19:22    Cardiac Studies:  ECG:  NSR no acute ST changes Chronic RBBB   Telemetry:  NSR no arrhythmia   Echo: 08/21/17 EF 60-65% normal only mild LAE   Medications:   .  acetaminophen  1,000 mg Oral TID  . aspirin  81 mg Oral Daily  . atorvastatin  40 mg Oral q1800  . dorzolamide  1 drop Right Eye BID  . enoxaparin (LOVENOX) injection  40 mg Subcutaneous Q24H  . feeding supplement (GLUCERNA SHAKE)  237 mL Oral BID BM  . gabapentin  300 mg Oral BID  . insulin aspart  0-5 Units Subcutaneous QHS  . insulin aspart  0-9 Units Subcutaneous TID WC  . insulin glargine  12 Units Subcutaneous BID  . lip balm  1 application Topical BID  . psyllium  1 packet Oral BID  . timolol  1 drop Right Eye BID     . lactated ringers 1,000 mL (09/23/17 0230)  .  piperacillin-tazobactam (ZOSYN)  IV 3.375 g (09/23/17 0228)    Assessment/Plan:  RBBB:  Abnormal ECG benign no cardiac issues or arrhythmias at this time  Echo normal  Post op:  Doing well on antibiotics gangrenous GB JP drain in per surgery   Will sign off   Jenkins Rouge 09/23/2017, 8:11 AM

## 2017-09-23 NOTE — Progress Notes (Signed)
Inpatient Diabetes Program Recommendations  AACE/ADA: New Consensus Statement on Inpatient Glycemic Control (2015)  Target Ranges:  Prepandial:   less than 140 mg/dL      Peak postprandial:   less than 180 mg/dL (1-2 hours)      Critically ill patients:  140 - 180 mg/dL   Results for CARLITOS, BOTTINO (MRN [id number]) as of 09/23/2017 12:51  Ref. Range 09/22/2017 07:49 09/22/2017 11:54 09/22/2017 17:51 09/22/2017 21:20  Glucose-Capillary Latest Ref Range: 65 - 99 mg/dL 202 (H) 264 (H) 204 (H) 231 (H)   Results for JAHZION, BROGDEN (MRN [id number]) as of 09/23/2017 12:51  Ref. Range 09/23/2017 07:36 09/23/2017 12:16  Glucose-Capillary Latest Ref Range: 65 - 99 mg/dL 171 (H) 259 (H)    Home DM Meds: Lantus 30 units daily       Humalog 3 units BID (Breakfast and Dinner)  Current Insulin Orders: Lantus 12 units BID      Novolog Sensitive Correction Scale/ SSI (0-9 units) TID AC + HS      Note Lantus increased to 12 units BID last PM.  Having elevated postprandial glucose levels.    MD- Please consider starting Novolog Meal Coverage:  Novolog 3 units TID with meals (hold if pt eats <50% of meal)     --Will follow patient during hospitalization--  Wyn Quaker RN, MSN, CDE Diabetes Coordinator Inpatient Glycemic Control Team Team Pager: [phone] (8a-5p)

## 2017-09-23 NOTE — Progress Notes (Signed)
[facility] Progress Note  2 Days Post-Op  Subjective: CC: bloating Patient feels like he is having some bloating in upper abdomen. Some mild nausea with eating. Abdominal pain is mild and well controlled. Walked in the hall once earlier. Not passing flatus. Had 2 large BMs this AM.  UOP good. VSS.   Objective: Vital signs in last 24 hours: Temp:  [97.7 F (36.5 C)-98.4 F (36.9 C)] 97.7 F (36.5 C) (02/06 0630) Pulse Rate:  [58-71] 58 (02/06 0630) Resp:  [16-18] 16 (02/06 0630) BP: (94-128)/(54-77) 128/77 (02/06 0630) SpO2:  [96 %-97 %] 97 % (02/06 0630) Last BM Date: 09/19/17  Intake/Output from previous day: 02/05 0701 - 02/06 0700 In: 2540 [P.O.:1240; I.V.:1150; IV Piggyback:150] Out: 1416 [Urine:1310; Drains:106] Intake/Output this shift: Total I/O In: 360 [P.O.:360] Out: 25 [Drains:25]  PE: Gen:  Alert, NAD, pleasant Card:  Regular rate and rhythm, pedal pulses 2+ BL Pulm:  Normal effort, clear to auscultation bilaterally Abd: Soft, non-tender, mildly distended, bowel sounds hypoactive, no HSM, incision C/D/I; drain with serosanguinous fluid Skin: warm and dry, no rashes  Psych: A&Ox3   Lab Results:  Recent Labs    09/22/17 0516 09/23/17 0508  WBC 13.4* 10.7*  HGB 11.7* 10.5*  HCT 35.6* 30.6*  PLT 146* 156   BMET Recent Labs    09/22/17 0516 09/23/17 0508  NA 135 136  K 3.5 3.6  CL 104 106  CO2 23 24  GLUCOSE 212* 200*  BUN 53* 54*  CREATININE 1.49* 1.41*  CALCIUM 7.0* 7.0*   PT/INR No results for input(s): LABPROT, INR in the last 72 hours. CMP     Component Value Date/Time   NA 136 09/23/2017 0508   NA 139 03/05/2017 1145   K 3.6 09/23/2017 0508   CL 106 09/23/2017 0508   CO2 24 09/23/2017 0508   GLUCOSE 200 (H) 09/23/2017 0508   BUN 54 (H) 09/23/2017 0508   BUN 13 03/05/2017 1145   CREATININE 1.41 (H) 09/23/2017 0508   CREATININE 0.80 09/02/2016 0902   CALCIUM 7.0 (L) 09/23/2017 0508   PROT 5.2 (L) 09/23/2017 0508    PROT 6.8 03/05/2017 1145   ALBUMIN 2.4 (L) 09/23/2017 0508   ALBUMIN 4.3 03/05/2017 1145   AST 99 (H) 09/23/2017 0508   ALT 133 (H) 09/23/2017 0508   ALKPHOS 78 09/23/2017 0508   BILITOT 0.7 09/23/2017 0508   BILITOT 0.9 03/05/2017 1145   GFRNONAA 53 (L) 09/23/2017 0508   GFRNONAA >89 07/21/2016 0956   GFRAA >60 09/23/2017 0508   GFRAA >89 07/21/2016 0956   Lipase     Component Value Date/Time   LIPASE 20 09/22/2017 0516       Studies/Results: US Renal  Result Date: 09/21/2017 CLINICAL DATA:  Acute renal failure EXAM: RENAL / URINARY TRACT ULTRASOUND COMPLETE COMPARISON:  CT abdomen and pelvis September 19, 2017 FINDINGS: Right Kidney: Length: 11.4 cm. Echogenicity and renal cortical thickness are within normal limits. No mass, perinephric fluid, or hydronephrosis visualized. No sonographically demonstrable calculus or ureterectasis. Left Kidney: Length: 11.4 cm. Echogenicity and renal cortical thickness are within normal limits. No mass, perinephric fluid, or hydronephrosis visualized. No sonographically demonstrable calculus or ureterectasis. Bladder: Appears normal for degree of bladder distention. IMPRESSION: Study within normal limits. Electronically Signed   By: Lowella Grip III M.D.   On: 09/21/2017 19:22    Anti-infectives: Anti-infectives (From admission, onward)   Start     Dose/Rate Route Frequency Ordered Stop   09/21/17 1500  clindamycin (CLEOCIN) 900 mg, gentamicin (GARAMYCIN) 240 mg in sodium chloride 0.9 % 1,000 mL for intraperitoneal lavage      Intraperitoneal To Surgery 09/21/17 1427 09/21/17 1455   09/21/17 1030  piperacillin-tazobactam (ZOSYN) IVPB 3.375 g     3.375 g 12.5 mL/hr over 240 Minutes Intravenous Every 8 hours 09/21/17 0930     09/21/17 0000  amoxicillin-clavulanate (AUGMENTIN) 875-125 MG tablet     1 tablet Oral 2 times daily 09/21/17 1549     09/19/17 1200  cefTRIAXone (ROCEPHIN) 2 g in dextrose 5 % 50 mL IVPB  Status:  Discontinued     2  g 100 mL/hr over 30 Minutes Intravenous Every 24 hours 09/19/17 1120 09/21/17 0930       Assessment/Plan Poorly controlled insulin-dependent diabetes- A1C 7.6 Diabetic retinopathy/perpherial neuropathy History of coronary artery disease/NSTEMI -status post stent11/2012- OK for surgery per Cardiology - restart ASA 81 after surgery - Echo is pending Hyperlipidemia Hypertension-poorly controlled Mild renal insuffiencey - creatinine 1.4, continue IVF LFT's are up - bilirubin is stable  Leukocytosis - improving  Acute gangrenous cholecystitis S/P laparoscopic cholecystectomy and drain placement 09/21/17, Dr. Alwyn Pea - POD#2 - increase ambulation and mobilize more - will leave patient on [facility] diet for now, but if any worsening nausea or distention would back down to CLD - 106 cc output/24 h - Drain to stay in for 2 weeks due to  concern for possible cystic duct leak - Follow-up 2/19 [facility] clinic   FEN: IV fluids/HH diet ID: rocephin 2/2 >2/4;Zosyn 2/4>> -patient needs the IV antibiotics for an additional 5 days through 09/26/17. DVT: Lovenox Foley: None Follow up: [facility]    LOS: 4 days    Brigid Re , [facility] 09/23/2017, 1:50 PM Pager: [phone] Consults: [phone] Mon-Fri 7:00 am-4:30 pm Sat-Sun 7:00 am-11:30 am

## 2017-09-24 DIAGNOSIS — K810 Acute cholecystitis: Secondary | ICD-10-CM

## 2017-09-24 LAB — CBC WITH DIFFERENTIAL/PLATELET
Basophils Absolute: 0 10*3/uL (ref 0.0–0.1)
Basophils Relative: 0 %
EOS ABS: 0.1 10*3/uL (ref 0.0–0.7)
EOS PCT: 1 %
HCT: 33.6 % — ABNORMAL LOW (ref 39.0–52.0)
Hemoglobin: 11.7 g/dL — ABNORMAL LOW (ref 13.0–17.0)
LYMPHS ABS: 1 10*3/uL (ref 0.7–4.0)
LYMPHS PCT: 13 %
MCH: 29.2 pg (ref 26.0–34.0)
MCHC: 34.8 g/dL (ref 30.0–36.0)
MCV: 83.8 fL (ref 78.0–100.0)
MONO ABS: 1.2 10*3/uL — AB (ref 0.1–1.0)
MONOS PCT: 14 %
Neutro Abs: 5.9 10*3/uL (ref 1.7–7.7)
Neutrophils Relative %: 72 %
PLATELETS: 198 10*3/uL (ref 150–400)
RBC: 4.01 MIL/uL — ABNORMAL LOW (ref 4.22–5.81)
RDW: 13.2 % (ref 11.5–15.5)
WBC: 8.2 10*3/uL (ref 4.0–10.5)

## 2017-09-24 LAB — CULTURE, BLOOD (ROUTINE X 2)
Culture: NO GROWTH
Culture: NO GROWTH
SPECIAL REQUESTS: ADEQUATE
SPECIAL REQUESTS: ADEQUATE

## 2017-09-24 LAB — GLUCOSE, CAPILLARY
GLUCOSE-CAPILLARY: 125 mg/dL — AB (ref 65–99)
GLUCOSE-CAPILLARY: 90 mg/dL (ref 65–99)
GLUCOSE-CAPILLARY: 75 mg/dL (ref 65–99)
Glucose-Capillary: 164 mg/dL — ABNORMAL HIGH (ref 65–99)

## 2017-09-24 LAB — COMPREHENSIVE METABOLIC PANEL
ALT: 110 U/L — ABNORMAL HIGH (ref 17–63)
ANION GAP: 5 (ref 5–15)
AST: 72 U/L — ABNORMAL HIGH (ref 15–41)
Albumin: 2.5 g/dL — ABNORMAL LOW (ref 3.5–5.0)
Alkaline Phosphatase: 79 U/L (ref 38–126)
BUN: 30 mg/dL — ABNORMAL HIGH (ref 6–20)
CALCIUM: 7.3 mg/dL — AB (ref 8.9–10.3)
CHLORIDE: 107 mmol/L (ref 101–111)
CO2: 23 mmol/L (ref 22–32)
CREATININE: 0.92 mg/dL (ref 0.61–1.24)
Glucose, Bld: 186 mg/dL — ABNORMAL HIGH (ref 65–99)
Potassium: 3.7 mmol/L (ref 3.5–5.1)
SODIUM: 135 mmol/L (ref 135–145)
Total Bilirubin: 0.3 mg/dL (ref 0.3–1.2)
Total Protein: 5.5 g/dL — ABNORMAL LOW (ref 6.5–8.1)

## 2017-09-24 LAB — HEMOGLOBIN A1C
Hgb A1c MFr Bld: 8.2 % — ABNORMAL HIGH (ref 4.8–5.6)
Mean Plasma Glucose: 188.64 mg/dL

## 2017-09-24 MED ORDER — PROMETHAZINE HCL 25 MG/ML IJ SOLN
12.5000 mg | Freq: Once | INTRAMUSCULAR | Status: AC
  Administered 2017-09-24: 12.5 mg via INTRAVENOUS
  Filled 2017-09-24: qty 1

## 2017-09-24 MED ORDER — BISACODYL 10 MG RE SUPP
10.0000 mg | Freq: Once | RECTAL | Status: AC
  Administered 2017-09-24: 10 mg via RECTAL
  Filled 2017-09-24: qty 1

## 2017-09-24 MED ORDER — PROMETHAZINE HCL 25 MG/ML IJ SOLN
12.5000 mg | Freq: Four times a day (QID) | INTRAMUSCULAR | Status: DC | PRN
  Administered 2017-09-24 – 2017-09-25 (×3): 12.5 mg via INTRAVENOUS
  Filled 2017-09-24 (×3): qty 1

## 2017-09-24 NOTE — Progress Notes (Signed)
3 Days Post-Op   Subjective/Chief Complaint: Pt w/ some nausea last night and today. +BM   Objective: Vital signs in last 24 hours: Temp:  [98 F (36.7 C)-99 F (37.2 C)] 98 F (36.7 C) (02/07 0453) Pulse Rate:  [72-79] 74 (02/07 0453) Resp:  [20] 20 (02/07 0453) BP: (136-182)/(75-86) 136/76 (02/07 0453) SpO2:  [97 %-99 %] 97 % (02/07 0453) Weight:  [110.7 kg (244 lb 0.8 oz)] 110.7 kg (244 lb 0.8 oz) (02/07 0453) Last BM Date: 09/23/17  Intake/Output from previous day: 02/06 0701 - 02/07 0700 In: 700 [P.O.:600; IV Piggyback:100] Out: 685 [Urine:500; Drains:185] Intake/Output this shift: No intake/output data recorded.  General appearance: alert and cooperative GI: soft, approp ttp, ND, hypoactive BS, JP SS, inc c/d/i  Lab Results:  Recent Labs    09/23/17 0508 09/24/17 0539  WBC 10.7* 8.2  HGB 10.5* 11.7*  HCT 30.6* 33.6*  PLT 156 198   BMET Recent Labs    09/23/17 0508 09/24/17 0539  NA 136 135  K 3.6 3.7  CL 106 107  CO2 24 23  GLUCOSE 200* 186*  BUN 54* 30*  CREATININE 1.41* 0.92  CALCIUM 7.0* 7.3*   PT/INR No results for input(s): LABPROT, INR in the last 72 hours. ABG No results for input(s): PHART, HCO3 in the last 72 hours.  Invalid input(s): PCO2, PO2  Studies/Results: No results found.  Anti-infectives: Anti-infectives (From admission, onward)   Start     Dose/Rate Route Frequency Ordered Stop   09/21/17 1500  clindamycin (CLEOCIN) 900 mg, gentamicin (GARAMYCIN) 240 mg in sodium chloride 0.9 % 1,000 mL for intraperitoneal lavage      Intraperitoneal To Surgery 09/21/17 1427 09/21/17 1455   09/21/17 1030  piperacillin-tazobactam (ZOSYN) IVPB 3.375 g     3.375 g 12.5 mL/hr over 240 Minutes Intravenous Every 8 hours 09/21/17 0930     09/21/17 0000  amoxicillin-clavulanate (AUGMENTIN) 875-125 MG tablet     1 tablet Oral 2 times daily 09/21/17 1549     09/19/17 1200  cefTRIAXone (ROCEPHIN) 2 g in dextrose 5 % 50 mL IVPB  Status:   Discontinued     2 g 100 mL/hr over 30 Minutes Intravenous Every 24 hours 09/19/17 1120 09/21/17 0930      Assessment/Plan: s/p Procedure(s): LAPAROSCOPIC CHOLECYSTECTOMY WITH INTRAOPERATIVE CHOLANGIOGRAM (N/A) Con't diet for now, may be developing ileus-con't to monitor Encourage to amb in hall If improves may be ready for hoem in next 1-2d  LOS: 5 days    Rosario Jacks., Anne Hahn 09/24/2017

## 2017-09-24 NOTE — Progress Notes (Signed)
After prns would not control elevated BP along with BP orders from Dr Grandville Silos, nurse paged on call Dr Hilbert Bible to report BP concerns / nausea. Dr Hilbert Bible wrote orders for phenergan IV and verbalized to nurse that once the nausea is under control, IF BP continues to be elevated to page and get a prn for hypertension.

## 2017-09-24 NOTE — Progress Notes (Signed)
Occupational Therapy Treatment Patient Details Name: Derek Lowery MRN: [id number] DOB: 06/01/1956 Today's Date: 09/24/2017    History of present illness 62 yo male with history of insulin-dependent diabetes, coronary artery disease, status post MI 2012 who presents to the emergency department with a 2-day history of epigastric and right upper quadrant abdominal pain.  This is associated with nausea and vomiting. Dx of acute gangrenous cholecystitis, s/p lap chole 09/21/17.   OT comments  Provided into to pt on how to order tub bench  Follow Up Recommendations  Home health OT;Supervision/Assistance - 24 hour    Equipment Recommendations  Tub/shower bench    Recommendations for Other Services      Precautions / Restrictions Precautions Precautions: Other (comment)(drain) Precaution Comments: abdominal surgery, pt denies falls in past 1 year, poor vision Restrictions Weight Bearing Restrictions: No       Mobility Bed Mobility Overal bed mobility: Needs Assistance Bed Mobility: Supine to Sit;Sit to Supine     Supine to sit: Min guard Sit to supine: Min guard      Transfers Overall transfer level: Needs assistance Equipment used: Rolling walker (2 wheeled) Transfers: Sit to/from Stand Sit to Stand: Min guard              Balance Overall balance assessment: No apparent balance deficits (not formally assessed)                                         ADL either performed or assessed with clinical judgement   ADL Overall ADL's : Needs assistance/impaired     Grooming: Sitting;Wash/dry Lobbyist Transfer: Min guard;Comfort height toilet;Grab bars;RW   Toileting- Clothing Manipulation and Hygiene: Supervision/safety;Sit to/from stand         General ADL Comments: Issued and went over in detail energy conservation handout. Pt not able to see handout. OT read handout to pt and discussed.  Pt verbalized  understanding.     Vision Baseline Vision/History: Glaucoma Patient Visual Report: No change from baseline            Cognition Arousal/Alertness: Awake/alert Behavior During Therapy: WFL for tasks assessed/performed Overall Cognitive Status: Within Functional Limits for tasks assessed                                                General Comments  energy conservation handout issued.       Frequency  Min 2X/week        Progress Toward Goals  OT Goals(current goals can now be found in the care plan section)  Progress towards OT goals: Progressing toward goals     Plan Discharge plan remains appropriate       AM-PAC PT "6 Clicks" Daily Activity     Outcome Measure   Help from another person eating meals?: None Help from another person taking care of personal grooming?: None Help from another person toileting, which includes using toliet, bedpan, or urinal?: None Help from another person bathing (including washing, rinsing, drying)?: None Help from another person to put on and taking off regular upper body clothing?: None Help from another person to put on and taking off regular  lower body clothing?: A Little 6 Click Score: 23    End of Session Equipment Utilized During Treatment: Rolling walker  OT Visit Diagnosis: Low vision, both eyes (H54.2);Pain Pain - Right/Left: Right Pain - part of body: (abdomen)   Activity Tolerance Patient tolerated treatment well   Patient Left in chair;with call bell/phone within reach   Nurse Communication Mobility status        Time: 9373-4287 OT Time Calculation (min): 14 min  Charges: OT General Charges $OT Visit: 1 Visit OT Treatments $Self Care/Home Management : 8-22 mins  [place], [place]   Payton Mccallum D 09/24/2017, 11:10 AM

## 2017-09-24 NOTE — Progress Notes (Signed)
PT Cancellation Note  Patient Details Name: Derek Lowery MRN: [id number] DOB: 02/06/1956   Cancelled Treatment:    Reason Eval/Treat Not Completed: PT screened, no needs identified, will sign off(new PT order received, please refer to PT eval 09/22/17. Pt was observed ambulating in halls with RW without assistance. Encouraged pt to continue walking in halls at least TID. PT signing off as pt is walking 400' independently with RW. )   Philomena Doheny 09/24/2017, 11:47 AM [phone]

## 2017-09-24 NOTE — Progress Notes (Signed)
Patient with continued nausea. No vomiting; though patient reports dry heaves (not observed by this nurse).  Abdomen is soft; tender; active bowel sounds. Patient received Dulcolax suppository x 1 today with 1 small, soft bowel movement.  Patient also ambulated in hall x 4 today and reports feeling somewhat better; but still has nausea after eating; even with having only clear liquids for dinner.    BP: 160s-180s/80s to 90s most of day; requiring his scheduled oral meds plus PRN IV metoprolol x 1 and IV Hydralazine x 1;   BP @ 1615: 125/62; Continue to monitor.

## 2017-09-24 NOTE — Consult Note (Signed)
[facility] Nurse wound consult note Reason for Consult:blisters and partial thickness skin loss in gluteal cleft Wound type:MASD, friction Pressure Injury POA: NA Measurement: has area 3cm x 3cm reddened with one intact serous filled blister 1cm round, one partial thickness 0.6 cm round, several dried up old blistered areas all within the 3cm area in the cleft. Wound bed:red Drainage (amount, consistency, odor) none Periwound: moist, red, intact Dressing procedure/placement/frequency: Removed foam dressing which was holding the buttocks together and trapping moisture, not appropriate place to use InterDry Ag+.  Applied Purple Top Criticaid ointment to intergluteal cleft area. Patient has protruding sacral area, unusal for someone who is overweight. Placed smaller foam on that to prevent pressure ulcer. Orders written for bedside RN.  We will not follow, but will remain available to this patient, to nursing, and the medical and/or surgical teams.  Please re-consult if we need to assist further.    Fara Olden, RN-C, WTA-C, [facility] Wound Treatment Associate [facility] Care Associate

## 2017-09-24 NOTE — Progress Notes (Signed)
PROGRESS NOTE    Derek Lowery  HRC:[id number] DOB: 09-06-1955 DOA: 09/18/2017 PCP: Ladell Pier, MD   Brief Narrative:   62 y/o male with PMH of HTN, DM, CAD s/p stent (2012), presented with progressively worsening epigastric, RUQ abdominal pains, nausea, vomiting and being admitted for cholecystectomy.  Hospitalist team was consulted to manage diabetes mellitus.      Assessment & Plan:   Principal Problem:   Acute gangrenous cholecystitis s/p lap cholecystectomy 09/21/2017 Active Problems:   CAD in native artery   Essential hypertension   Diabetic polyneuropathy associated with type 2 diabetes mellitus (HCC)   Retinopathy due to secondary diabetes ([place])   ARF (acute renal failure) ([place])  1 acute gangrenous cholecystitis Patient was initially admitted under the general surgical team for cholecystectomy secondary to acute cholecystitis.  Patient status post laparoscopic cholecystectomy per Dr. Johney Maine 09/21/2017, was taken to the operating room.  JP drain left in place.  Continue IV Zosyn and transition to oral Augmentin to complete 5 more days of antibiotic treatment per general surgical recommendations.  Per general surgery.  2.  Diabetes mellitus type 2 poorly controlled with diabetic retinopathy and peripheral neuropathy Hemoglobin A1c was 7.6 on 06/09/2017.  Repeat hemoglobin A1c.  CBGs have ranged from 90-164.  Patient due to nausea with poor oral intake today.  Continue Lantus 12 units twice daily.  Continue sliding scale insulin.  Outpatient follow-up.  3.  Hypertension Patient noted to be hypotensive earlier 1.  Antihypertensive medications on hold.  IV fluids have been discontinued.  Continue Norvasc at 5 mg daily.  Hydralazine as needed.  4.  Right bundle branch block Patient has been seen by cardiology patient noted to have no cardiac issues or arrhythmias during the hospitalization.  2D echo done was normal.  Cardiology.  5.  Hypokalemia  Repleted  6.   Hyperlipidemia Continue statin.  7.  Mild chronic diastolic CHF Stable.  Patient denies any shortness of breath.  Patient with 2+ lower extremity edema.  Due to recent worsening renal function will just monitor for now.  Monitor urine output.  Daily weights.  Strict I's and O's.  8.  Acute renal failure Initially felt to be secondary to dehydration.  ACE inhibitor discontinued.  Patient was on IV fluids which have subsequently been discontinued.  Renal ultrasound negative for hydronephrosis. Follow.  9.  Right foot ulcer on plantar aspect of first big toe Stable.  Continue current wound care.  No signs of infection.    DVT prophylaxis: Lovenox Code Status: Full Family Communication: Updated patient.  No family at bedside. Disposition Plan: Home when okay with general surgery.   Consultants:   Cardiology: Dr. Skeet Latch 09/20/2017  [facility] hospitalist: Dr. Daleen Bo 09/19/2017  Procedures:  CT abdomen and pelvis 09/19/2017  Chest x-ray 09/21/2017  Acute abdominal series 09/19/2017  Renal ultrasound 09/21/2017  Right upper quadrant abdominal ultrasound 09/19/2017  2D echo 09/21/2017  Laparoscopic cholecystectomy per Dr. Johney Maine 09/21/2017  Antimicrobials:   IV Rocephin 09/19/2017>>>>> 09/21/2017  IV Zosyn 09/21/2017     Subjective: Patient states does not feel too well today.  Patient complaining of nausea.  No emesis noted.    Objective: Vitals:   09/24/17 0120 09/24/17 0200 09/24/17 0453 09/24/17 1013  BP: (!) 182/86 (!) 173/86 136/76 (!) 183/84  Pulse: 73 79 74 72  Resp:   20 20  Temp:   98 F (36.7 C) 98.2 F (36.8 C)  TempSrc:   Oral Oral  SpO2:  97% 96%  Weight:   110.7 kg (244 lb 0.8 oz)     Intake/Output Summary (Last 24 hours) at 09/24/2017 1234 Last data filed at 09/24/2017 0915 Gross per 24 hour  Intake 460 ml  Output 710 ml  Net -250 ml   Filed Weights   09/21/17 1404 09/24/17 0453  Weight: 102 kg (224 lb 13.9 oz) 110.7 kg (244 lb 0.8 oz)     Examination:  General exam: Appears calm and comfortable  Respiratory system: Lungs clear to auscultation anterior lung fields.  No wheezes, no crackles, no rhonchi.  Respiratory effort normal. Cardiovascular system: Regular rate and rhythm no murmurs rubs or gallops.  No JVD.  2+ bilateral lower extremity edema.  Gastrointestinal system: Abdomen is distended, soft, some diffuse tenderness to palpation.  JP drain in place and draining. Central nervous system: Alert and oriented. No focal neurological deficits. Extremities: Symmetric 5 x 5 power. Skin: No rashes, lesions or ulcers Psychiatry: Judgement and insight appear normal. Mood & affect appropriate.     Data Reviewed: I have personally reviewed following labs and imaging studies  CBC: Recent Labs  Lab 09/19/17 0158 09/20/17 0516 09/21/17 0425 09/22/17 0516 09/23/17 0508 09/24/17 0539  WBC 12.7* 22.1* 23.0* 13.4* 10.7* 8.2  NEUTROABS 10.3*  --   --   --   --  5.9  HGB 13.5 13.7 12.6* 11.7* 10.5* 11.7*  HCT 39.0 39.9 38.0* 35.6* 30.6* 33.6*  MCV 84.1 84.5 87.0 86.8 83.8 83.8  PLT 172 155 162 146* 156 403   Basic Metabolic Panel: Recent Labs  Lab 09/20/17 0516 09/21/17 0425 09/22/17 0516 09/23/17 0508 09/24/17 0539  NA 138 139 135 136 135  K 3.1* 3.9 3.5 3.6 3.7  CL 103 106 104 106 107  CO2 25 26 23 24 23   GLUCOSE 241* 169* 212* 200* 186*  BUN 21* 39* 53* 54* 30*  CREATININE 0.68 1.46* 1.49* 1.41* 0.92  CALCIUM 8.4* 7.8* 7.0* 7.0* 7.3*  MG  --  1.9  --  2.0  --    GFR: Estimated Creatinine Clearance: 107.7 mL/min (by C-G formula based on SCr of 0.92 mg/dL). Liver Function Tests: Recent Labs  Lab 09/20/17 0516 09/21/17 0425 09/22/17 0516 09/23/17 0508 09/24/17 0539  AST 53* 181* 199* 99* 72*  ALT 45 171* 199* 133* 110*  ALKPHOS 81 84 92 78 79  BILITOT 1.5* 1.3* 1.1 0.7 0.3  PROT 7.1 6.3* 5.4* 5.2* 5.5*  ALBUMIN 3.5 2.8* 2.5* 2.4* 2.5*   Recent Labs  Lab 09/19/17 0158 09/22/17 0516  LIPASE  23 20   No results for input(s): AMMONIA in the last 168 hours. Coagulation Profile: No results for input(s): INR, PROTIME in the last 168 hours. Cardiac Enzymes: No results for input(s): CKTOTAL, CKMB, CKMBINDEX, TROPONINI in the last 168 hours. BNP (last 3 results) No results for input(s): PROBNP in the last 8760 hours. HbA1C: Recent Labs    09/24/17 0539  HGBA1C 8.2*   CBG: Recent Labs  Lab 09/23/17 1216 09/23/17 1643 09/23/17 2051 09/24/17 0701 09/24/17 1141  GLUCAP 259* 232* 163* 164* 125*   Lipid Profile: No results for input(s): CHOL, HDL, LDLCALC, TRIG, CHOLHDL, LDLDIRECT in the last 72 hours. Thyroid Function Tests: No results for input(s): TSH, T4TOTAL, FREET4, T3FREE, THYROIDAB in the last 72 hours. Anemia Panel: No results for input(s): VITAMINB12, FOLATE, FERRITIN, TIBC, IRON, RETICCTPCT in the last 72 hours. Sepsis Labs: Recent Labs  Lab 09/19/17 0545  LATICACIDVEN 0.96    Recent Results (  from the past 240 hour(s))  Blood culture (routine x 2)     Status: None (Preliminary result)   Collection Time: 09/19/17  6:44 AM  Result Value Ref Range Status   Specimen Description   Final    BLOOD LEFT HAND Performed at [facility] [address], [place], [place] [address]    Special Requests   Final    BOTTLES DRAWN AEROBIC AND ANAEROBIC Blood Culture adequate volume Performed at [facility] [address], [place], [place] [address]    Culture   Final    NO GROWTH 4 DAYS Performed at [facility], [place] [address], [place], [place] [address]    Report Status PENDING  Incomplete  Blood culture (routine x 2)     Status: None (Preliminary result)   Collection Time: 09/19/17  6:51 AM  Result Value Ref Range Status   Specimen Description   Final    BLOOD RIGHT HAND Performed at [facility] [address] [place], [place] [address]    Special Requests   Final    BOTTLES DRAWN AEROBIC AND  ANAEROBIC Blood Culture adequate volume Performed at [facility] [address] [place], [place] [address]    Culture   Final    NO GROWTH 4 DAYS Performed at [facility], [place] [address], [place], [place] [address]    Report Status PENDING  Incomplete  MRSA PCR Screening     Status: None   Collection Time: 09/20/17  8:05 AM  Result Value Ref Range Status   MRSA by PCR NEGATIVE NEGATIVE Final    Comment:        The GeneXpert MRSA Assay (FDA approved for NASAL specimens only), is one component of a comprehensive MRSA colonization surveillance program. It is not intended to diagnose MRSA infection nor to guide or monitor treatment for MRSA infections. Performed at [facility], [place] [address], [place], [place] [address]          Radiology Studies: No results found.      Scheduled Meds: . acetaminophen  1,000 mg Oral TID  . amLODipine  5 mg Oral Daily  . aspirin  81 mg Oral Daily  . atorvastatin  40 mg Oral q1800  . dorzolamide  1 drop Right Eye BID  . enoxaparin (LOVENOX) injection  40 mg Subcutaneous Q24H  . feeding supplement (GLUCERNA SHAKE)  237 mL Oral BID BM  . gabapentin  300 mg Oral BID  . insulin aspart  0-5 Units Subcutaneous QHS  . insulin aspart  0-9 Units Subcutaneous TID WC  . insulin aspart  3 Units Subcutaneous TID WC  . insulin glargine  12 Units Subcutaneous BID  . lip balm  1 application Topical BID  . psyllium  1 packet Oral BID  . saccharomyces boulardii  250 mg Oral BID  . simethicone  160 mg Oral QID  . timolol  1 drop Right Eye BID   Continuous Infusions: . piperacillin-tazobactam (ZOSYN)  IV 3.375 g (09/24/17 1147)     LOS: 5 days    Time spent: 68 Minutes    Irine Seal, MD [facility] Pager [phone]  If 7PM-7AM, please contact night-coverage [email or web address] Password [facility] 09/24/2017, 12:34 PM

## 2017-09-25 LAB — CBC
HEMATOCRIT: 35.3 % — AB (ref 39.0–52.0)
Hemoglobin: 12.1 g/dL — ABNORMAL LOW (ref 13.0–17.0)
MCH: 28.8 pg (ref 26.0–34.0)
MCHC: 34.3 g/dL (ref 30.0–36.0)
MCV: 84 fL (ref 78.0–100.0)
PLATELETS: 190 10*3/uL (ref 150–400)
RBC: 4.2 MIL/uL — AB (ref 4.22–5.81)
RDW: 13.3 % (ref 11.5–15.5)
WBC: 9.3 10*3/uL (ref 4.0–10.5)

## 2017-09-25 LAB — GLUCOSE, CAPILLARY
GLUCOSE-CAPILLARY: 124 mg/dL — AB (ref 65–99)
GLUCOSE-CAPILLARY: 111 mg/dL — AB (ref 65–99)
GLUCOSE-CAPILLARY: 100 mg/dL — AB (ref 65–99)
Glucose-Capillary: 96 mg/dL (ref 65–99)

## 2017-09-25 LAB — BASIC METABOLIC PANEL
ANION GAP: 5 (ref 5–15)
BUN: 22 mg/dL — AB (ref 6–20)
CO2: 25 mmol/L (ref 22–32)
Calcium: 7.8 mg/dL — ABNORMAL LOW (ref 8.9–10.3)
Chloride: 107 mmol/L (ref 101–111)
Creatinine, Ser: 0.9 mg/dL (ref 0.61–1.24)
GFR calc Af Amer: >60 mL/min (ref >60)
GFR calc non Af Amer: >60 mL/min (ref >60)
GLUCOSE: 92 mg/dL (ref 65–99)
POTASSIUM: 3.3 mmol/L — AB (ref 3.5–5.1)
Sodium: 137 mmol/L (ref 135–145)

## 2017-09-25 LAB — PHOSPHORUS: Phosphorus: 2.2 mg/dL — ABNORMAL LOW (ref 2.5–4.6)

## 2017-09-25 LAB — MAGNESIUM: MAGNESIUM: 2.2 mg/dL (ref 1.7–2.4)

## 2017-09-25 MED ORDER — POTASSIUM CHLORIDE 10 MEQ/100ML IV SOLN
10.0000 meq | INTRAVENOUS | Status: AC
  Administered 2017-09-25 (×4): 10 meq via INTRAVENOUS
  Filled 2017-09-25 (×4): qty 100

## 2017-09-25 MED ORDER — FENTANYL CITRATE (PF) 100 MCG/2ML IJ SOLN
25.0000 ug | INTRAMUSCULAR | Status: DC | PRN
  Administered 2017-09-26: 50 ug via INTRAVENOUS
  Filled 2017-09-25: qty 2

## 2017-09-25 MED ORDER — GABAPENTIN 300 MG PO CAPS
300.0000 mg | ORAL_CAPSULE | Freq: Every day | ORAL | Status: AC
  Administered 2017-09-25 – 2017-09-27 (×3): 300 mg via ORAL
  Filled 2017-09-25 (×3): qty 1

## 2017-09-25 MED ORDER — POTASSIUM CHLORIDE CRYS ER 20 MEQ PO TBCR
40.0000 meq | EXTENDED_RELEASE_TABLET | Freq: Two times a day (BID) | ORAL | Status: AC
  Administered 2017-09-25 – 2017-09-27 (×6): 40 meq via ORAL
  Filled 2017-09-25 (×6): qty 2

## 2017-09-25 MED ORDER — METHOCARBAMOL 500 MG PO TABS: 1000.0000 mg | ORAL_TABLET | Freq: Four times a day (QID) | ORAL | Status: DC | PRN

## 2017-09-25 MED ORDER — HYDROCHLOROTHIAZIDE 12.5 MG PO CAPS
12.5000 mg | ORAL_CAPSULE | Freq: Every day | ORAL | Status: DC
  Administered 2017-09-25 – 2017-09-28 (×4): 12.5 mg via ORAL
  Filled 2017-09-25 (×4): qty 1

## 2017-09-25 MED ORDER — TRAMADOL HCL 50 MG PO TABS: 50.0000 mg | ORAL_TABLET | Freq: Four times a day (QID) | ORAL | Status: DC | PRN

## 2017-09-25 NOTE — Progress Notes (Signed)
[facility] Progress Note  4 Days Post-Op  Subjective: CC: No complaints Patient feeling well this AM. Ambulated a couple times yesterday and had a BM. Passing gas this AM and nausea is improving. Patient reports that he only has a BM about 1x per week at home. Recommended colace and miralax to help with this.  UOP good. HTN.  Objective: Vital signs in last 24 hours: Temp:  [97.7 F (36.5 C)-98.5 F (36.9 C)] 98.5 F (36.9 C) (02/08 0941) Pulse Rate:  [68-92] 71 (02/08 0941) Resp:  [17-20] 17 (02/08 0509) BP: (119-183)/(62-90) 171/80 (02/08 0941) SpO2:  [93 %-100 %] 100 % (02/08 0941) Weight:  [108.9 kg (240 lb 1.3 oz)] 108.9 kg (240 lb 1.3 oz) (02/08 0509) Last BM Date: 09/24/17  Intake/Output from previous day: 02/07 0701 - 02/08 0700 In: 380 [P.O.:180; IV Piggyback:200] Out: 2040 [Urine:1800; Drains:240] Intake/Output this shift: Total I/O In: 740 [P.O.:740] Out: 30 [Drains:30]  PE: Gen:  Alert, NAD, pleasant Card:  Regular rate and rhythm Pulm:  Normal effort, clear to auscultation bilaterally Abd: Soft, mildly tender, non-distended, bowel sounds present, no HSM, incisions C/D/I Skin: warm and dry, no rashes  Psych: A&Ox3   Lab Results:  Recent Labs    09/24/17 0539 09/25/17 0537  WBC 8.2 9.3  HGB 11.7* 12.1*  HCT 33.6* 35.3*  PLT 198 190   BMET Recent Labs    09/24/17 0539 09/25/17 0537  NA 135 137  K 3.7 3.3*  CL 107 107  CO2 23 25  GLUCOSE 186* 92  BUN 30* 22*  CREATININE 0.92 0.90  CALCIUM 7.3* 7.8*   PT/INR No results for input(s): LABPROT, INR in the last 72 hours. CMP     Component Value Date/Time   NA 137 09/25/2017 0537   NA 139 03/05/2017 1145   K 3.3 (L) 09/25/2017 0537   CL 107 09/25/2017 0537   CO2 25 09/25/2017 0537   GLUCOSE 92 09/25/2017 0537   BUN 22 (H) 09/25/2017 0537   BUN 13 03/05/2017 1145   CREATININE 0.90 09/25/2017 0537   CREATININE 0.80 09/02/2016 0902   CALCIUM 7.8 (L) 09/25/2017 0537   PROT 5.5  (L) 09/24/2017 0539   PROT 6.8 03/05/2017 1145   ALBUMIN 2.5 (L) 09/24/2017 0539   ALBUMIN 4.3 03/05/2017 1145   AST 72 (H) 09/24/2017 0539   ALT 110 (H) 09/24/2017 0539   ALKPHOS 79 09/24/2017 0539   BILITOT 0.3 09/24/2017 0539   BILITOT 0.9 03/05/2017 1145   GFRNONAA >60 09/25/2017 0537   GFRNONAA >89 07/21/2016 0956   GFRAA >60 09/25/2017 0537   GFRAA >89 07/21/2016 0956   Lipase     Component Value Date/Time   LIPASE 20 09/22/2017 0516       Studies/Results: No results found.  Anti-infectives: Anti-infectives (From admission, onward)   Start     Dose/Rate Route Frequency Ordered Stop   09/21/17 1500  clindamycin (CLEOCIN) 900 mg, gentamicin (GARAMYCIN) 240 mg in sodium chloride 0.9 % 1,000 mL for intraperitoneal lavage      Intraperitoneal To Surgery 09/21/17 1427 09/21/17 1455   09/21/17 1030  piperacillin-tazobactam (ZOSYN) IVPB 3.375 g     3.375 g 12.5 mL/hr over 240 Minutes Intravenous Every 8 hours 09/21/17 0930     09/21/17 0000  amoxicillin-clavulanate (AUGMENTIN) 875-125 MG tablet     1 tablet Oral 2 times daily 09/21/17 1549     09/19/17 1200  cefTRIAXone (ROCEPHIN) 2 g in dextrose 5 % 50 mL IVPB  Status:  Discontinued     2 g 100 mL/hr over 30 Minutes Intravenous Every 24 hours 09/19/17 1120 09/21/17 0930       Assessment/Plan Poorly controlled insulin-dependent diabetes- A1C 7.6 Diabetic retinopathy/perpherial neuropathy History of coronary artery disease/NSTEMI -status post stent11/2012 Hyperlipidemia Hypertension-poorly controlled Mild renal insuffiencey - creatinine 0.9, improving Leukocytosis - resolved  Acute gangrenous cholecystitis S/P laparoscopic cholecystectomy and drain placement 09/21/17, Dr. Alwyn Pea Post-op ileus -POD#4 - increase ambulation and mobilize more - HH/CM diet - 240  cc output/24 h - Drain to stay in for 2 weeksdue to  concern for possible cystic duct leak - LFTs trending down, bili was 0.3  yesterday -Follow-up 2/19 [facility] clinic Hypokalemia - K 3.3 today replace K   FEN: HH diet ID: rocephin 2/2 >2/4;Zosyn 2/4>> -patient needs the IV antibiotics for an additional 5 days through 09/26/17. DVT: Lovenox Foley: None Follow up: [facility]  Patient stable for discharge from a surgical standpoint, post-operative ileus seems to be resolving. Would recommend bowel regimen at discharge. We will see in the clinic. We will sign off at this point. Please call with any questions or concerns.    LOS: 6 days    Brigid Re , [facility] 09/25/2017, 10:00 AM Pager: [phone] Consults: [phone] Mon-Fri 7:00 am-4:30 pm Sat-Sun 7:00 am-11:30 am

## 2017-09-25 NOTE — Progress Notes (Signed)
Paged PA to notify of patient's continued nausea and  Small amount of bleeding from umbilical incision.  PA states no imaging for now, continue with PRN anti-emetics, place dry gauze dressing over incision, and continue to monitor.

## 2017-09-25 NOTE — Progress Notes (Signed)
PROGRESS NOTE    Derek Lowery  CHE:[id number] DOB: 11-25-1955 DOA: 09/18/2017 PCP: Ladell Pier, MD   Brief Narrative:   62 y/o male with PMH of HTN, DM, CAD s/p stent (2012), presented with progressively worsening epigastric, RUQ abdominal pains, nausea, vomiting and being admitted for cholecystectomy.  Hospitalist team was consulted to manage diabetes mellitus.      Assessment & Plan:   Principal Problem:   Acute gangrenous cholecystitis s/p lap cholecystectomy 09/21/2017 Active Problems:   CAD in native artery   Essential hypertension   Diabetic polyneuropathy associated with type 2 diabetes mellitus (HCC)   Retinopathy due to secondary diabetes ([place])   ARF (acute renal failure) ([place])  1 acute gangrenous cholecystitis Patient was initially admitted under the general surgical team for cholecystectomy secondary to acute cholecystitis.  Patient status post laparoscopic cholecystectomy per Dr. Johney Maine 09/21/2017, was taken to the operating room.  JP drain left in place.  Continue IV Zosyn and transition to oral Augmentin to complete 5 more days of antibiotic treatment on discharge, per general surgical recommendations.  Due to patient's nausea will monitor over the next 24 hours and if continued worsening symptoms may need CT abdomen and pelvis.  General surgery following and appreciate input and recommendations.   2.  Diabetes mellitus type 2 poorly controlled with diabetic retinopathy and peripheral neuropathy Hemoglobin A1c was 7.6 on 06/09/2017.  Repeat hemoglobin A1c is 8.2.  CBGs have ranged from 96-124.  Patient due to nausea with poor oral intake today.  Continue Lantus 12 units twice daily.  Continue sliding scale insulin.  Outpatient follow-up.  3.  Hypertension Patient noted to be hypotensive earlier on.  Antihypertensive medications on hold.  IV fluids have been discontinued.  Continue Norvasc at 5 mg daily.  Resume home dose hydrochlorothiazide.  Hydralazine as  needed.  4.  Right bundle branch block Patient has been seen by cardiology patient noted to have no cardiac issues or arrhythmias during the hospitalization.  2D echo done was normal.  Cardiology.  5.  Hypokalemia  Replete  6.  Hyperlipidemia Continue statin.  7.  Mild chronic diastolic CHF Stable.  Patient denies any shortness of breath.  Patient with 1-2+ lower extremity edema.  Due to recent worsening renal function will just monitor for now.  She with good urine output of 1.8 L over the past 24 hours.  Monitor urine output.  Daily weights.  Strict I's and O's.  8.  Acute renal failure Initially felt to be secondary to dehydration.  ACE inhibitor discontinued.  Patient was on IV fluids which have subsequently been discontinued.  Renal ultrasound negative for hydronephrosis.  Good urine output of 1.8 L over the past 24 hours.  Follow.  9.  Right foot ulcer on plantar aspect of first big toe Stable.  Continue current wound care.  No signs of infection.    DVT prophylaxis: Lovenox Code Status: Full Family Communication: Updated patient.  No family at bedside. Disposition Plan: Home when okay with general surgery with resolution of nausea and continued improvement.   Consultants:   Cardiology: Dr. Skeet Latch 09/20/2017  [facility] hospitalist: Dr. Daleen Bo 09/19/2017  Procedures:  CT abdomen and pelvis 09/19/2017  Chest x-ray 09/21/2017  Acute abdominal series 09/19/2017  Renal ultrasound 09/21/2017  Right upper quadrant abdominal ultrasound 09/19/2017  2D echo 09/21/2017  Laparoscopic cholecystectomy per Dr. Johney Maine 09/21/2017  Antimicrobials:   IV Rocephin 09/19/2017>>>>> 09/21/2017  IV Zosyn 09/21/2017     Subjective: Patient states doing  great all morning and suddenly feels very nauseous and dry heaving.  No shortness of breath.  No chest pain.    Objective: Vitals:   09/24/17 1615 09/24/17 2100 09/25/17 0509 09/25/17 0941  BP: 125/62 119/63 136/72 (!) 171/80  Pulse: 75 74  68 71  Resp:  18 17   Temp:  97.8 F (36.6 C) 97.7 F (36.5 C) 98.5 F (36.9 C)  TempSrc:  Oral Oral Oral  SpO2:  97% 97% 100%  Weight:   108.9 kg (240 lb 1.3 oz)     Intake/Output Summary (Last 24 hours) at 09/25/2017 1206 Last data filed at 09/25/2017 0944 Gross per 24 hour  Intake 900 ml  Output 2020 ml  Net -1120 ml   Filed Weights   09/21/17 1404 09/24/17 0453 09/25/17 0509  Weight: 102 kg (224 lb 13.9 oz) 110.7 kg (244 lb 0.8 oz) 108.9 kg (240 lb 1.3 oz)    Examination:  General exam: Dry heaving. Respiratory system: CTA B.  No wheezes, no crackles, no rhonchi.  Respiratory effort normal. Cardiovascular system: RRR.  No m/r/g.  1-2+ bilateral lower extremity edema.  No JVD.   Gastrointestinal system: Abdomen is soft, nontender, nondistended, positive bowel sounds. JP drain in place and draining.  Umbilicus around the incision site with some dried blood. Central nervous system: Alert and oriented. No focal neurological deficits. Extremities: Symmetric 5 x 5 power. Skin: No rashes, lesions or ulcers Psychiatry: Judgement and insight appear normal. Mood & affect appropriate.     Data Reviewed: I have personally reviewed following labs and imaging studies  CBC: Recent Labs  Lab 09/19/17 0158  09/21/17 0425 09/22/17 0516 09/23/17 0508 09/24/17 0539 09/25/17 0537  WBC 12.7*   < > 23.0* 13.4* 10.7* 8.2 9.3  NEUTROABS 10.3*  --   --   --   --  5.9  --   HGB 13.5   < > 12.6* 11.7* 10.5* 11.7* 12.1*  HCT 39.0   < > 38.0* 35.6* 30.6* 33.6* 35.3*  MCV 84.1   < > 87.0 86.8 83.8 83.8 84.0  PLT 172   < > 162 146* 156 198 190   < > = values in this interval not displayed.   Basic Metabolic Panel: Recent Labs  Lab 09/21/17 0425 09/22/17 0516 09/23/17 0508 09/24/17 0539 09/25/17 0537  NA 139 135 136 135 137  K 3.9 3.5 3.6 3.7 3.3*  CL 106 104 106 107 107  CO2 26 23 24 23 25   GLUCOSE 169* 212* 200* 186* 92  BUN 39* 53* 54* 30* 22*  CREATININE 1.46* 1.49* 1.41* 0.92  0.90  CALCIUM 7.8* 7.0* 7.0* 7.3* 7.8*  MG 1.9  --  2.0  --  2.2  PHOS  --   --   --   --  2.2*   GFR: Estimated Creatinine Clearance: 109.3 mL/min (by C-G formula based on SCr of 0.9 mg/dL). Liver Function Tests: Recent Labs  Lab 09/20/17 0516 09/21/17 0425 09/22/17 0516 09/23/17 0508 09/24/17 0539  AST 53* 181* 199* 99* 72*  ALT 45 171* 199* 133* 110*  ALKPHOS 81 84 92 78 79  BILITOT 1.5* 1.3* 1.1 0.7 0.3  PROT 7.1 6.3* 5.4* 5.2* 5.5*  ALBUMIN 3.5 2.8* 2.5* 2.4* 2.5*   Recent Labs  Lab 09/19/17 0158 09/22/17 0516  LIPASE 23 20   No results for input(s): AMMONIA in the last 168 hours. Coagulation Profile: No results for input(s): INR, PROTIME in the last 168 hours. Cardiac Enzymes:  No results for input(s): CKTOTAL, CKMB, CKMBINDEX, TROPONINI in the last 168 hours. BNP (last 3 results) No results for input(s): PROBNP in the last 8760 hours. HbA1C: Recent Labs    09/24/17 0539  HGBA1C 8.2*   CBG: Recent Labs  Lab 09/24/17 1141 09/24/17 1610 09/24/17 2057 09/25/17 0823 09/25/17 1154  GLUCAP 125* 90 75 96 124*   Lipid Profile: No results for input(s): CHOL, HDL, LDLCALC, TRIG, CHOLHDL, LDLDIRECT in the last 72 hours. Thyroid Function Tests: No results for input(s): TSH, T4TOTAL, FREET4, T3FREE, THYROIDAB in the last 72 hours. Anemia Panel: No results for input(s): VITAMINB12, FOLATE, FERRITIN, TIBC, IRON, RETICCTPCT in the last 72 hours. Sepsis Labs: Recent Labs  Lab 09/19/17 0545  LATICACIDVEN 0.96    Recent Results (from the past 240 hour(s))  Blood culture (routine x 2)     Status: None   Collection Time: 09/19/17  6:44 AM  Result Value Ref Range Status   Specimen Description   Final    BLOOD LEFT HAND Performed at [facility] [address] [place], [place] [address]    Special Requests   Final    BOTTLES DRAWN AEROBIC AND ANAEROBIC Blood Culture adequate volume Performed at [facility]  [address], [place], [place] [address]    Culture   Final    NO GROWTH 5 DAYS Performed at [facility], [place] [address], [place], [place] [address]    Report Status 09/24/2017 FINAL  Final  Blood culture (routine x 2)     Status: None   Collection Time: 09/19/17  6:51 AM  Result Value Ref Range Status   Specimen Description   Final    BLOOD RIGHT HAND Performed at [facility] [address], [place], [place] [address]    Special Requests   Final    BOTTLES DRAWN AEROBIC AND ANAEROBIC Blood Culture adequate volume Performed at [facility] [address], [place], [place] [address]    Culture   Final    NO GROWTH 5 DAYS Performed at [facility], [place] [address] [place], [place] [address]    Report Status 09/24/2017 FINAL  Final  MRSA PCR Screening     Status: None   Collection Time: 09/20/17  8:05 AM  Result Value Ref Range Status   MRSA by PCR NEGATIVE NEGATIVE Final    Comment:        The GeneXpert MRSA Assay (FDA approved for NASAL specimens only), is one component of a comprehensive MRSA colonization surveillance program. It is not intended to diagnose MRSA infection nor to guide or monitor treatment for MRSA infections. Performed at [facility], [place] [address] [place], [place] [address]          Radiology Studies: No results found.      Scheduled Meds: . acetaminophen  1,000 mg Oral TID  . amLODipine  5 mg Oral Daily  . aspirin  81 mg Oral Daily  . atorvastatin  40 mg Oral q1800  . dorzolamide  1 drop Right Eye BID  . enoxaparin (LOVENOX) injection  40 mg Subcutaneous Q24H  . feeding supplement (GLUCERNA SHAKE)  237 mL Oral BID BM  . gabapentin  300 mg Oral BID  . hydrochlorothiazide  12.5 mg Oral Daily  . insulin aspart  0-5 Units Subcutaneous QHS  . insulin aspart  0-9 Units Subcutaneous TID WC  . insulin aspart  3 Units Subcutaneous TID WC  .  insulin glargine  12 Units  Subcutaneous BID  . lip balm  1 application Topical BID  . potassium chloride  40 mEq Oral BID  . psyllium  1 packet Oral BID  . saccharomyces boulardii  250 mg Oral BID  . simethicone  160 mg Oral QID  . timolol  1 drop Right Eye BID   Continuous Infusions: . piperacillin-tazobactam (ZOSYN)  IV Stopped (09/25/17 0946)  . potassium chloride Stopped (09/25/17 1133)     LOS: 6 days    Time spent: 47 Minutes    Irine Seal, MD [facility] Pager [phone]  If 7PM-7AM, please contact night-coverage [email or web address] Password [facility] 09/25/2017, 12:06 PM

## 2017-09-26 ENCOUNTER — Inpatient Hospital Stay (HOSPITAL_COMMUNITY): Payer: Medicare Other

## 2017-09-26 LAB — COMPREHENSIVE METABOLIC PANEL
ALT: 153 U/L — ABNORMAL HIGH (ref 17–63)
ANION GAP: 5 (ref 5–15)
AST: 152 U/L — ABNORMAL HIGH (ref 15–41)
Albumin: 2.5 g/dL — ABNORMAL LOW (ref 3.5–5.0)
Alkaline Phosphatase: 83 U/L (ref 38–126)
BILIRUBIN TOTAL: 0.7 mg/dL (ref 0.3–1.2)
BUN: 18 mg/dL (ref 6–20)
CHLORIDE: 104 mmol/L (ref 101–111)
CO2: 26 mmol/L (ref 22–32)
Calcium: 7.9 mg/dL — ABNORMAL LOW (ref 8.9–10.3)
Creatinine, Ser: 0.92 mg/dL (ref 0.61–1.24)
Glucose, Bld: 90 mg/dL (ref 65–99)
POTASSIUM: 3.6 mmol/L (ref 3.5–5.1)
Sodium: 135 mmol/L (ref 135–145)
TOTAL PROTEIN: 5.7 g/dL — AB (ref 6.5–8.1)

## 2017-09-26 LAB — CBC
HEMATOCRIT: 35.3 % — AB (ref 39.0–52.0)
HEMOGLOBIN: 12.2 g/dL — AB (ref 13.0–17.0)
MCH: 28.9 pg (ref 26.0–34.0)
MCHC: 34.6 g/dL (ref 30.0–36.0)
MCV: 83.6 fL (ref 78.0–100.0)
Platelets: 224 10*3/uL (ref 150–400)
RBC: 4.22 MIL/uL (ref 4.22–5.81)
RDW: 13.2 % (ref 11.5–15.5)
WBC: 11.9 10*3/uL — AB (ref 4.0–10.5)

## 2017-09-26 LAB — MAGNESIUM: Magnesium: 1.9 mg/dL (ref 1.7–2.4)

## 2017-09-26 LAB — GLUCOSE, CAPILLARY
GLUCOSE-CAPILLARY: 77 mg/dL (ref 65–99)
GLUCOSE-CAPILLARY: 138 mg/dL — AB (ref 65–99)
Glucose-Capillary: 138 mg/dL — ABNORMAL HIGH (ref 65–99)
Glucose-Capillary: 124 mg/dL — ABNORMAL HIGH (ref 65–99)
Glucose-Capillary: 119 mg/dL — ABNORMAL HIGH (ref 65–99)

## 2017-09-26 LAB — PREALBUMIN: PREALBUMIN: 15.9 mg/dL — AB (ref 18–38)

## 2017-09-26 MED ORDER — PROCHLORPERAZINE EDISYLATE 5 MG/ML IJ SOLN
10.0000 mg | Freq: Four times a day (QID) | INTRAMUSCULAR | Status: DC | PRN
  Administered 2017-09-26 – 2017-09-27 (×2): 10 mg via INTRAVENOUS
  Filled 2017-09-26 (×2): qty 2

## 2017-09-26 MED ORDER — SIMETHICONE 80 MG PO CHEW: 160.0000 mg | CHEWABLE_TABLET | Freq: Four times a day (QID) | ORAL | Status: DC

## 2017-09-26 MED ORDER — SIMETHICONE 80 MG PO CHEW
160.0000 mg | CHEWABLE_TABLET | Freq: Three times a day (TID) | ORAL | Status: AC
  Administered 2017-09-26 – 2017-09-28 (×8): 160 mg via ORAL
  Filled 2017-09-26 (×9): qty 2

## 2017-09-26 MED ORDER — LORAZEPAM 2 MG/ML IJ SOLN
0.5000 mg | Freq: Once | INTRAMUSCULAR | Status: AC
  Administered 2017-09-26: 0.5 mg via INTRAVENOUS
  Filled 2017-09-26: qty 1

## 2017-09-26 NOTE — Progress Notes (Signed)
General Surgery [facility]  Assessment & Plan:  Acute gangrenous cholecystitis S/P laparoscopic cholecystectomy and drain placement 09/21/17, Dr. Alwyn Pea Post-op ileus -[name] - monitor drain output - will leave drain in place until follow up at [place] office scheduled for 1/94/1740 - umbilical wound dry and intact this AM - dressing changed - continue to monitor for drainage  Patient stable for discharge from a surgical standpoint. Would recommend bowel regimen at discharge. We will see in the clinic. We will sign off at this point. Please call with any questions or concerns.         Earnstine Regal, MD, [facility]       Office: [phone]    Chief Complaint: Status post lap cholecystectomy with drain placement  Subjective: Patient up at bedside, eating breakfast.  Denies nausea or emesis.  Anticipating discharge over weekend.  Objective: Vital signs in last 24 hours: Temp:  [98.2 F (36.8 C)-98.6 F (37 C)] 98.6 F (37 C) (02/09 0524) Pulse Rate:  [70-80] 78 (02/09 0524) Resp:  [18-24] 18 (02/09 0524) BP: (135-180)/(67-89) 135/67 (02/09 0524) SpO2:  [98 %-100 %] 98 % (02/09 0524) Weight:  [108.9 kg (240 lb 1.3 oz)] 108.9 kg (240 lb 1.3 oz) (02/08 1821) Last BM Date: 09/24/17  Intake/Output from previous day: 02/08 0701 - 02/09 0700 In: 1900 [P.O.:1350; IV Piggyback:550] Out: 2431 [Urine:2250; Drains:181] Intake/Output this shift: No intake/output data recorded.  Physical Exam: HEENT - sclerae clear, mucous membranes moist Neck - soft Abdomen - dressing changed at umbilical wound; small serosanguinous on dressing; JP drain with thin serous output   Lab Results:  Recent Labs    09/25/17 0537 09/26/17 0003  WBC 9.3 11.9*  HGB 12.1* 12.2*  HCT 35.3* 35.3*  PLT 190 224   BMET Recent Labs    09/25/17 0537 09/26/17 0003  NA 137 135  K 3.3* 3.6  CL 107 104  CO2 25 26  GLUCOSE 92 90  BUN 22* 18   CREATININE 0.90 0.92  CALCIUM 7.8* 7.9*   PT/INR No results for input(s): LABPROT, INR in the last 72 hours. Comprehensive Metabolic Panel:    Component Value Date/Time   NA 135 09/26/2017 0003   NA 137 09/25/2017 0537   NA 139 03/05/2017 1145   K 3.6 09/26/2017 0003   K 3.3 (L) 09/25/2017 0537   CL 104 09/26/2017 0003   CL 107 09/25/2017 0537   CO2 26 09/26/2017 0003   CO2 25 09/25/2017 0537   BUN 18 09/26/2017 0003   BUN 22 (H) 09/25/2017 0537   BUN 13 03/05/2017 1145   CREATININE 0.92 09/26/2017 0003   CREATININE 0.90 09/25/2017 0537   CREATININE 0.80 09/02/2016 0902   CREATININE 0.82 07/21/2016 0956   GLUCOSE 90 09/26/2017 0003   GLUCOSE 92 09/25/2017 0537   CALCIUM 7.9 (L) 09/26/2017 0003   CALCIUM 7.8 (L) 09/25/2017 0537   AST 152 (H) 09/26/2017 0003   AST 72 (H) 09/24/2017 0539   ALT 153 (H) 09/26/2017 0003   ALT 110 (H) 09/24/2017 0539   ALKPHOS 83 09/26/2017 0003   ALKPHOS 79 09/24/2017 0539   BILITOT 0.7 09/26/2017 0003   BILITOT 0.3 09/24/2017 0539   BILITOT 0.9 03/05/2017 1145   PROT 5.7 (L) 09/26/2017 0003   PROT 5.5 (L) 09/24/2017 0539   PROT 6.8 03/05/2017 1145   ALBUMIN 2.5 (L) 09/26/2017 0003   ALBUMIN 2.5 (L) 09/24/2017 1497  ALBUMIN 4.3 03/05/2017 1145    Studies/Results: No results found.    [place] M 09/26/2017  Patient ID: Derek Lowery, male   DOB: [birth year], 62 y.o.   MRN: [id number]

## 2017-09-26 NOTE — Progress Notes (Signed)
PROGRESS NOTE    Derek Lowery  ION:[id number] DOB: 03/26/1956 DOA: 09/18/2017 PCP: Ladell Pier, MD   Brief Narrative:   62 y/o male with PMH of HTN, DM, CAD s/p stent (2012), presented with progressively worsening epigastric, RUQ abdominal pains, nausea, vomiting and being admitted for cholecystectomy.  Hospitalist team was consulted to manage diabetes mellitus.      Assessment & Plan:   Principal Problem:   Acute gangrenous cholecystitis s/p lap cholecystectomy 09/21/2017 Active Problems:   CAD in native artery   Essential hypertension   Diabetic polyneuropathy associated with type 2 diabetes mellitus (HCC)   Retinopathy due to secondary diabetes ([place])   ARF (acute renal failure) ([place])  1 acute gangrenous cholecystitis Patient was initially admitted under the general surgical team for cholecystectomy secondary to acute cholecystitis.  Patient status post laparoscopic cholecystectomy per Dr. Johney Maine 09/21/2017, was taken to the operating room.  JP drain left in place.  Continue IV Zosyn and transition to oral Augmentin to complete 5 more days of antibiotic treatment on discharge, per general surgical recommendations.  Patient with nausea and emesis for the second day in the room and as such we will get abdominal films to rule out obstruction or ileus.  Give a dose of IV Ativan x1 as patient also with some complaints of anxiety. General surgery following and appreciate input and recommendations.  Supportive care.  2.  Diabetes mellitus type 2 poorly controlled with diabetic retinopathy and peripheral neuropathy Hemoglobin A1c was 7.6 on 06/09/2017.  Repeat hemoglobin A1c is 8.2.  CBGs have ranged from 77-138.  Patient due to nausea with poor oral intake today.  Patient with nausea and emesis today.  Continue current regimen of Lantus 12 units twice daily and sliding scale insulin.   3.  Hypertension Patient noted to be hypotensive earlier on.  Antihypertensive medications were on  hold.  IV fluids have been discontinued.  Continue HCTZ 12.5 mg daily, Norvasc 5 mg daily.   4.  Right bundle branch block Patient has been seen by cardiology patient noted to have no cardiac issues or arrhythmias during the hospitalization.  2D echo done was normal.  Cardiology.  5.  Hypokalemia  Repleted  6.  Hyperlipidemia Continue statin.  7.  Mild chronic diastolic CHF Stable.  Patient denies any shortness of breath.  Patient with 1-2+ lower extremity edema.  Patient with a urine output of 2.25 L over the past 24 hours.  Compensated.  Strict I's and O's.  Daily weights.  Continue to follow urine output.   8.  Acute renal failure Initially felt to be secondary to dehydration.  ACE inhibitor discontinued.  Patient was on IV fluids which have subsequently been discontinued.  Renal ultrasound negative for hydronephrosis.  Patient with a urine output of 2.250 L over the past 24 hours.  Renal function improving daily.  Follow.  9.  Right foot ulcer on plantar aspect of first big toe No signs of infection.  Continue current wound care.     DVT prophylaxis: Lovenox Code Status: Full Family Communication: Updated patient.  No family at bedside. Disposition Plan: Home when okay with general surgery with resolution of nausea and continued improvement.   Consultants:   Cardiology: Dr. Skeet Latch 09/20/2017  [facility] hospitalist: Dr. Daleen Bo 09/19/2017  Procedures:  CT abdomen and pelvis 09/19/2017  Chest x-ray 09/21/2017  Acute abdominal series 09/19/2017  Renal ultrasound 09/21/2017  Right upper quadrant abdominal ultrasound 09/19/2017  2D echo 09/21/2017  Laparoscopic cholecystectomy per  Dr. Johney Maine 09/21/2017  Antimicrobials:   IV Rocephin 09/19/2017>>>>> 09/21/2017  IV Zosyn 09/21/2017     Subjective: Patient sitting up at the side of the bed vomiting.  Complains of nausea.  Denies any significant abdominal pain.  No chest pain.  No shortness of breath.    Objective: Vitals:    09/25/17 1738 09/25/17 1821 09/25/17 2129 09/26/17 0524  BP: (!) 145/89  (!) 165/86 135/67  Pulse: 80  77 78  Resp:  18 18 18   Temp:   98.6 F (37 C) 98.6 F (37 C)  TempSrc:   Oral Oral  SpO2:   98% 98%  Weight:  108.9 kg (240 lb 1.3 oz)    Height:  5' 10"  (1.778 m)      Intake/Output Summary (Last 24 hours) at 09/26/2017 1118 Last data filed at 09/26/2017 0923 Gross per 24 hour  Intake 1480 ml  Output 3077 ml  Net -1597 ml   Filed Weights   09/24/17 0453 09/25/17 0509 09/25/17 1821  Weight: 110.7 kg (244 lb 0.8 oz) 108.9 kg (240 lb 1.3 oz) 108.9 kg (240 lb 1.3 oz)    Examination:  General exam: Vomiting Respiratory system: Lungs clear to auscultation bilaterally.  No crackles, no wheezes, no rhonchi.  Respiratory effort normal. Cardiovascular system: RRR.  No m/r/g.  1-2+ bilateral lower extremity edema.  No JVD.   Gastrointestinal system: Abdomen is nontender, obese, soft, positive bowel sounds, no rebound, no guarding. JP drain in place and draining.  Central nervous system: Alert and oriented. No focal neurological deficits. Extremities: Symmetric 5 x 5 power. Skin: No rashes, lesions or ulcers Psychiatry: Judgement and insight appear normal. Mood & affect appropriate.     Data Reviewed: I have personally reviewed following labs and imaging studies  CBC: Recent Labs  Lab 09/22/17 0516 09/23/17 0508 09/24/17 0539 09/25/17 0537 09/26/17 0003  WBC 13.4* 10.7* 8.2 9.3 11.9*  NEUTROABS  --   --  5.9  --   --   HGB 11.7* 10.5* 11.7* 12.1* 12.2*  HCT 35.6* 30.6* 33.6* 35.3* 35.3*  MCV 86.8 83.8 83.8 84.0 83.6  PLT 146* 156 198 190 628   Basic Metabolic Panel: Recent Labs  Lab 09/21/17 0425 09/22/17 0516 09/23/17 0508 09/24/17 0539 09/25/17 0537 09/26/17 0003  NA 139 135 136 135 137 135  K 3.9 3.5 3.6 3.7 3.3* 3.6  CL 106 104 106 107 107 104  CO2 26 23 24 23 25 26   GLUCOSE 169* 212* 200* 186* 92 90  BUN 39* 53* 54* 30* 22* 18  CREATININE 1.46* 1.49*  1.41* 0.92 0.90 0.92  CALCIUM 7.8* 7.0* 7.0* 7.3* 7.8* 7.9*  MG 1.9  --  2.0  --  2.2 1.9  PHOS  --   --   --   --  2.2*  --    GFR: Estimated Creatinine Clearance: 106.9 mL/min (by C-G formula based on SCr of 0.92 mg/dL). Liver Function Tests: Recent Labs  Lab 09/21/17 0425 09/22/17 0516 09/23/17 0508 09/24/17 0539 09/26/17 0003  AST 181* 199* 99* 72* 152*  ALT 171* 199* 133* 110* 153*  ALKPHOS 84 92 78 79 83  BILITOT 1.3* 1.1 0.7 0.3 0.7  PROT 6.3* 5.4* 5.2* 5.5* 5.7*  ALBUMIN 2.8* 2.5* 2.4* 2.5* 2.5*   Recent Labs  Lab 09/22/17 0516  LIPASE 20   No results for input(s): AMMONIA in the last 168 hours. Coagulation Profile: No results for input(s): INR, PROTIME in the last 168 hours. Cardiac  Enzymes: No results for input(s): CKTOTAL, CKMB, CKMBINDEX, TROPONINI in the last 168 hours. BNP (last 3 results) No results for input(s): PROBNP in the last 8760 hours. HbA1C: Recent Labs    09/24/17 0539  HGBA1C 8.2*   CBG: Recent Labs  Lab 09/25/17 1154 09/25/17 1713 09/25/17 2128 09/26/17 0753 09/26/17 0952  GLUCAP 124* 111* 100* 77 138*   Lipid Profile: No results for input(s): CHOL, HDL, LDLCALC, TRIG, CHOLHDL, LDLDIRECT in the last 72 hours. Thyroid Function Tests: No results for input(s): TSH, T4TOTAL, FREET4, T3FREE, THYROIDAB in the last 72 hours. Anemia Panel: No results for input(s): VITAMINB12, FOLATE, FERRITIN, TIBC, IRON, RETICCTPCT in the last 72 hours. Sepsis Labs: No results for input(s): PROCALCITON, LATICACIDVEN in the last 168 hours.  Recent Results (from the past 240 hour(s))  Blood culture (routine x 2)     Status: None   Collection Time: 09/19/17  6:44 AM  Result Value Ref Range Status   Specimen Description   Final    BLOOD LEFT HAND Performed at [facility] [address] [place], [place] [address]    Special Requests   Final    BOTTLES DRAWN AEROBIC AND ANAEROBIC Blood Culture adequate volume Performed at [facility] [address] [place], [place] [address]    Culture   Final    NO GROWTH 5 DAYS Performed at [facility], [place] [address], [place], [place] [address]    Report Status 09/24/2017 FINAL  Final  Blood culture (routine x 2)     Status: None   Collection Time: 09/19/17  6:51 AM  Result Value Ref Range Status   Specimen Description   Final    BLOOD RIGHT HAND Performed at [facility] [address] [place], [place] [address]    Special Requests   Final    BOTTLES DRAWN AEROBIC AND ANAEROBIC Blood Culture adequate volume Performed at [facility] [address], [place], [place] [address]    Culture   Final    NO GROWTH 5 DAYS Performed at [facility], [place] [address] [place], [place] [address]    Report Status 09/24/2017 FINAL  Final  MRSA PCR Screening     Status: None   Collection Time: 09/20/17  8:05 AM  Result Value Ref Range Status   MRSA by PCR NEGATIVE NEGATIVE Final    Comment:        The GeneXpert MRSA Assay (FDA approved for NASAL specimens only), is one component of a comprehensive MRSA colonization surveillance program. It is not intended to diagnose MRSA infection nor to guide or monitor treatment for MRSA infections. Performed at [facility], [place] [address] [place], [place] [address]          Radiology Studies: No results found.      Scheduled Meds: . acetaminophen  1,000 mg Oral TID  . amLODipine  5 mg Oral Daily  . aspirin  81 mg Oral Daily  . atorvastatin  40 mg Oral q1800  . dorzolamide  1 drop Right Eye BID  . enoxaparin (LOVENOX) injection  40 mg Subcutaneous Q24H  . feeding supplement (GLUCERNA SHAKE)  237 mL Oral BID BM  . gabapentin  300 mg Oral QHS  . hydrochlorothiazide  12.5 mg Oral Daily  . insulin aspart  0-5 Units Subcutaneous QHS  . insulin aspart  0-9 Units Subcutaneous TID WC  . insulin aspart  3 Units Subcutaneous TID  WC  .  insulin glargine  12 Units Subcutaneous BID  . lip balm  1 application Topical BID  . potassium chloride  40 mEq Oral BID  . psyllium  1 packet Oral BID  . saccharomyces boulardii  250 mg Oral BID  . simethicone  160 mg Oral QID  . timolol  1 drop Right Eye BID   Continuous Infusions: . piperacillin-tazobactam (ZOSYN)  IV 3.375 g (09/26/17 0947)     LOS: 7 days    Time spent: 68 Minutes    Irine Seal, MD [facility] Pager [phone]  If 7PM-7AM, please contact night-coverage [email or web address] Password [email or web address] 09/26/2017, 11:18 AM

## 2017-09-27 LAB — GLUCOSE, CAPILLARY
GLUCOSE-CAPILLARY: 113 mg/dL — AB (ref 65–99)
GLUCOSE-CAPILLARY: 115 mg/dL — AB (ref 65–99)
Glucose-Capillary: 113 mg/dL — ABNORMAL HIGH (ref 65–99)
Glucose-Capillary: 105 mg/dL — ABNORMAL HIGH (ref 65–99)

## 2017-09-27 LAB — BASIC METABOLIC PANEL
ANION GAP: 6 (ref 5–15)
BUN: 20 mg/dL (ref 6–20)
CALCIUM: 7.8 mg/dL — AB (ref 8.9–10.3)
CHLORIDE: 103 mmol/L (ref 101–111)
CO2: 25 mmol/L (ref 22–32)
Creatinine, Ser: 0.88 mg/dL (ref 0.61–1.24)
GFR calc non Af Amer: >60 mL/min (ref >60)
Glucose, Bld: 129 mg/dL — ABNORMAL HIGH (ref 65–99)
POTASSIUM: 4.2 mmol/L (ref 3.5–5.1)
Sodium: 134 mmol/L — ABNORMAL LOW (ref 135–145)

## 2017-09-27 LAB — CBC
HCT: 34.2 % — ABNORMAL LOW (ref 39.0–52.0)
HEMOGLOBIN: 11.7 g/dL — AB (ref 13.0–17.0)
MCH: 28.7 pg (ref 26.0–34.0)
MCHC: 34.2 g/dL (ref 30.0–36.0)
MCV: 84 fL (ref 78.0–100.0)
Platelets: 190 10*3/uL (ref 150–400)
RBC: 4.07 MIL/uL — AB (ref 4.22–5.81)
RDW: 13.1 % (ref 11.5–15.5)
WBC: 12.1 10*3/uL — ABNORMAL HIGH (ref 4.0–10.5)

## 2017-09-27 MED ORDER — ALPRAZOLAM 0.5 MG PO TABS
0.5000 mg | ORAL_TABLET | Freq: Once | ORAL | Status: AC
  Administered 2017-09-27: 0.5 mg via ORAL
  Filled 2017-09-27: qty 1

## 2017-09-27 NOTE — Progress Notes (Signed)
General Surgery [facility]  Assessment & Plan: Acute gangrenous cholecystitis S/P laparoscopic cholecystectomy and drain placement 09/21/17, Dr. Alwyn Pea Post-op ileus -Lowery#6 - monitor drain output - will leave drain in place until follow up at [place] office scheduled for 9/76/7341 - umbilical wound dry and intact this AM - dressing changed - continue to monitor for drainage  Patient had episode of emesis yesterday AM.  Didn't want to eat last night.  Has now had BM.  No nausea or emesis this AM.  Waiting for breakfast today.  Home soon with drain in place.  Will arrange follow up at [place] office on 10/06/2017.        Earnstine Regal, MD, [facility]       Office: [phone]    Chief Complaint: Cholecystitis  Subjective: Patient in bed, comfortable, denies nausea.  Has not eaten this AM.  Had BM overnight.  Objective: Vital signs in last 24 hours: Temp:  [98 F (36.7 C)-99.2 F (37.3 C)] 98 F (36.7 C) (02/10 0537) Pulse Rate:  [65-80] 65 (02/10 0537) Resp:  [18] 18 (02/10 0537) BP: (125-157)/(64-79) 125/64 (02/10 0537) SpO2:  [97 %-100 %] 99 % (02/10 0537) Last BM Date: 09/25/17  Intake/Output from previous day: 02/09 0701 - 02/10 0700 In: 1300 [P.O.:1200; IV Piggyback:100] Out: 1411 [Urine:625; Emesis/NG output:575; Drains:211] Intake/Output this shift: No intake/output data recorded.  Physical Exam: HEENT - sclerae clear, mucous membranes moist Neck - soft Abdomen - soft without distension; JP drain with thin serous output; umbilical dressing dry   Lab Results:  Recent Labs    09/26/17 0003 09/27/17 0410  WBC 11.9* 12.1*  HGB 12.2* 11.7*  HCT 35.3* 34.2*  PLT 224 190   BMET Recent Labs    09/26/17 0003 09/27/17 0410  NA 135 134*  K 3.6 4.2  CL 104 103  CO2 26 25  GLUCOSE 90 129*  BUN 18 20  CREATININE 0.92 0.88  CALCIUM 7.9* 7.8*   PT/INR No results for input(s): LABPROT, INR in the last  72 hours. Comprehensive Metabolic Panel:    Component Value Date/Time   NA 134 (L) 09/27/2017 0410   NA 135 09/26/2017 0003   NA 139 03/05/2017 1145   K 4.2 09/27/2017 0410   K 3.6 09/26/2017 0003   CL 103 09/27/2017 0410   CL 104 09/26/2017 0003   CO2 25 09/27/2017 0410   CO2 26 09/26/2017 0003   BUN 20 09/27/2017 0410   BUN 18 09/26/2017 0003   BUN 13 03/05/2017 1145   CREATININE 0.88 09/27/2017 0410   CREATININE 0.92 09/26/2017 0003   CREATININE 0.80 09/02/2016 0902   CREATININE 0.82 07/21/2016 0956   GLUCOSE 129 (H) 09/27/2017 0410   GLUCOSE 90 09/26/2017 0003   CALCIUM 7.8 (L) 09/27/2017 0410   CALCIUM 7.9 (L) 09/26/2017 0003   AST 152 (H) 09/26/2017 0003   AST 72 (H) 09/24/2017 0539   ALT 153 (H) 09/26/2017 0003   ALT 110 (H) 09/24/2017 0539   ALKPHOS 83 09/26/2017 0003   ALKPHOS 79 09/24/2017 0539   BILITOT 0.7 09/26/2017 0003   BILITOT 0.3 09/24/2017 0539   BILITOT 0.9 03/05/2017 1145   PROT 5.7 (L) 09/26/2017 0003   PROT 5.5 (L) 09/24/2017 0539   PROT 6.8 03/05/2017 1145   ALBUMIN 2.5 (L) 09/26/2017 0003   ALBUMIN 2.5 (L) 09/24/2017 0539   ALBUMIN 4.3 03/05/2017 1145    Studies/Results: Dg  Abd 2 Views  Result Date: 09/26/2017 CLINICAL DATA:  Abdominal distention EXAM: ABDOMEN - 2 VIEW COMPARISON:  CT 09/19/2017 FINDINGS: Prior cholecystectomy. Nonobstructive bowel gas pattern with gas throughout nondistended large and small bowel. No free air organomegaly. IMPRESSION: Changes of cholecystectomy.  No obstruction or free air. Electronically Signed   By: Rolm Baptise M.D.   On: 09/26/2017 12:02      [place] M 09/27/2017  Patient ID: Derek Lowery, male   DOB: 1955-10-26, 62 y.o.   MRN: [id number]

## 2017-09-27 NOTE — Progress Notes (Signed)
Occupational Therapy Treatment Patient Details Name: Derek Lowery MRN: [id number] DOB: Sep 19, 1955 Today's Date: 09/27/2017    History of present illness 62 y.o. male with history of insulin-dependent diabetes, coronary artery disease, status post MI 2012 who presents to the emergency department with a 2-day history of epigastric and right upper quadrant abdominal pain.  This is associated with nausea and vomiting. Dx of acute gangrenous cholecystitis, s/p lap chole 09/21/17.   OT comments  Pt performed ADLs in session and did well. Continue to recommend HHOT at this time. Feel pt will continue to benefit from acute OT to increase independence prior to d/c.  Follow Up Recommendations  Home health OT;Supervision/Assistance - 24 hour    Equipment Recommendations  Tub/shower bench    Recommendations for Other Services      Precautions / Restrictions Precautions Precautions: Other (comment) Precaution Comments: abdominal surgery, pt denies falls in past 1 year, poor vision Restrictions Weight Bearing Restrictions: No       Mobility Bed Mobility Overal bed mobility: Modified Independent       Supine to sit: Modified independent (Device/Increase time)(OT did flatten out bed for pt)        Transfers Overall transfer level: Needs assistance Equipment used: Rolling walker (2 wheeled) Transfers: Sit to/from Stand Sit to Stand: Min guard         General transfer comment: cue for hand placement    Balance      Min guard for ambulation for safety-used RW.                                     ADL either performed or assessed with clinical judgement   ADL Overall ADL's : Needs assistance/impaired     Grooming: Wash/dry face;Standing;Min guard(Min guard to go gather items; S at sink.)   Upper Body Bathing: Standing;Min guard Upper Body Bathing Details (indicate cue type and reason): washed some of arms; Min guard to go gather items; S at sink Lower Body  Bathing: Min guard(standing at sink; Min guard to go gather items; S at sink) Lower Body Bathing Details (indicate cue type and reason): OT assisted in moving gown-pt washed bottom Upper Body Dressing : Sitting;Moderate assistance Upper Body Dressing Details (indicate cue type and reason): assist with gown     Toilet Transfer: Min guard;RW;Ambulation(sit to stand from bed)           Functional mobility during ADLs: Min guard;Rolling walker General ADL Comments: Pt gathered ADL items in room and went to sink and performed ADL tasks.      Vision  pt has glaucoma     Perception     Praxis      Cognition Arousal/Alertness: Awake/alert Behavior During Therapy: WFL for tasks assessed/performed Overall Cognitive Status: Within Functional Limits for tasks assessed                                          Exercises     Shoulder Instructions       General Comments      Pertinent Vitals/ Pain       Pain Assessment: 0-10 Pain Score: 2  Pain Location: abdomen and bottom Pain Descriptors / Indicators: Sore;Other (Comment)(aggravating) Pain Intervention(s): Monitored during session  Home Living  Prior Functioning/Environment              Frequency  Min 2X/week        Progress Toward Goals  OT Goals(current goals can now be found in the care plan section)  Progress towards OT goals: Progressing toward goals  Acute Rehab OT Goals Patient Stated Goal: get out of here OT Goal Formulation: With patient Time For Goal Achievement: 10/06/17 Potential to Achieve Goals: Good ADL Goals Pt Will Perform Grooming: with modified independence;standing Pt Will Perform Tub/Shower Transfer: Tub transfer;with supervision;ambulating;tub bench;rolling walker Additional ADL Goal #1: Pt will gather clothes and dress self with mod I using walker to stand. Additional ADL Goal #2: Pt will state 3 things he  can do at home to attempt to conserve energy during adls.  Plan Discharge plan remains appropriate    Co-evaluation                 AM-PAC PT "6 Clicks" Daily Activity     Outcome Measure   Help from another person eating meals?: None Help from another person taking care of personal grooming?: A Little Help from another person toileting, which includes using toliet, bedpan, or urinal?: A Little Help from another person bathing (including washing, rinsing, drying)?: A Little Help from another person to put on and taking off regular upper body clothing?: A Little Help from another person to put on and taking off regular lower body clothing?: A Little 6 Click Score: 19    End of Session Equipment Utilized During Treatment: Gait belt;Rolling walker  OT Visit Diagnosis: Pain;Low vision, both eyes (H54.2) Pain - Right/Left: (abdomen) Pain - part of body: (abdomen)   Activity Tolerance Patient tolerated treatment well   Patient Left in chair;with call bell/phone within reach   Nurse Communication          Time: 4076-8088 OT Time Calculation (min): 18 min  Charges: OT General Charges $OT Visit: 1 Visit OT Treatments $Self Care/Home Management : 8-22 mins    Derek Lowery OTR/L 09/27/2017, 11:23 AM

## 2017-09-27 NOTE — Progress Notes (Signed)
PROGRESS NOTE    Derek Lowery  GLO:[id number] DOB: 1956/07/14 DOA: 09/18/2017 PCP: Ladell Pier, MD   Brief Narrative:   62 y/o male with PMH of HTN, DM, CAD s/p stent (2012), presented with progressively worsening epigastric, RUQ abdominal pains, nausea, vomiting and being admitted for cholecystectomy.  Hospitalist team was consulted to manage diabetes mellitus.      Assessment & Plan:   Principal Problem:   Acute gangrenous cholecystitis s/p lap cholecystectomy 09/21/2017 Active Problems:   CAD in native artery   Essential hypertension   Diabetic polyneuropathy associated with type 2 diabetes mellitus (HCC)   Retinopathy due to secondary diabetes ([place])   ARF (acute renal failure) ([place])  1 acute gangrenous cholecystitis Patient was initially admitted under the general surgical team for cholecystectomy secondary to acute cholecystitis.  Patient status post laparoscopic cholecystectomy per Dr. Johney Maine 09/21/2017, was taken to the operating room.  JP drain left in place.  Patient status post 5 days of antibiotics postop and as such we will discontinue IV Zosyn today.  Patient with improvement with nausea and emesis today.  Abdominal x-rays obtained were unremarkable.  Patient status post 1 dose of IV Ativan which resolved his nausea and anxiety. General surgery following and appreciate input and recommendations.  Supportive care.  2.  Diabetes mellitus type 2 poorly controlled with diabetic retinopathy and peripheral neuropathy Hemoglobin A1c was 7.6 on 06/09/2017.  Repeat hemoglobin A1c is 8.2.  CBGs have ranged from 113-124.  Patient with improvement with his nausea.  Patient hesitant to eat this morning.  Feeling better.  Continue current regimen of Lantus and sliding scale insulin.   3.  Hypertension Patient noted to be hypotensive earlier on.  Antihypertensive medications currently on hold.  IV fluids have been discontinued.  Blood pressure stable.  Continue HCTZ 12.5 mg daily,  Norvasc 5 mg daily.   4.  Right bundle branch block Patient has been seen by cardiology patient noted to have no cardiac issues or arrhythmias during the hospitalization.  2D echo done was normal.  Cardiology.  5.  Hypokalemia  Repleted  6.  Hyperlipidemia Continue statin.  7.  Mild chronic diastolic CHF Stable.  Patient denies any shortness of breath.  Patient with 1+ lower extremity edema.  Patient with a urine output of 625cc over the past 24 hours.  Compensated.  Strict I's and O's.  Daily weights.  Continue to follow urine output.   8.  Acute renal failure Initially felt to be secondary to dehydration.  ACE inhibitor discontinued.  Patient was on IV fluids which have subsequently been discontinued.  Renal ultrasound negative for hydronephrosis.  Renal function improving daily.  Patient with a urine output of 625 cc over the past 24 hours. Follow.  9.  Right foot ulcer on plantar aspect of first big toe No signs of infection.  Continue current wound care.     DVT prophylaxis: Lovenox Code Status: Full Family Communication: Updated patient.  No family at bedside. Disposition Plan: Home when okay with general surgery with resolution of nausea and continued improvement hopefully in the next 24 hours.   Consultants:   Cardiology: Dr. Skeet Latch 09/20/2017  [facility] hospitalist: Dr. Daleen Bo 09/19/2017  Procedures:  CT abdomen and pelvis 09/19/2017  Chest x-ray 09/21/2017  Acute abdominal series 09/19/2017  Renal ultrasound 09/21/2017  Right upper quadrant abdominal ultrasound 09/19/2017  2D echo 09/21/2017  Laparoscopic cholecystectomy per Dr. Johney Maine 09/21/2017  Antimicrobials:   IV Rocephin 09/19/2017>>>>> 09/21/2017  IV Zosyn  09/21/2017>>>>>>> 09/27/2017     Subjective: Patient sitting on the side of the bed.  Denies any nausea.  Denies any abdominal pain.  Feels much better today.  Tolerating oral intake.  No chest pain no shortness of breath.   Objective: Vitals:   09/26/17  0524 09/26/17 1415 09/26/17 2206 09/27/17 0537  BP: 135/67 (!) 157/72 131/79 125/64  Pulse: 78 80 79 65  Resp: 18 18 18 18   Temp: 98.6 F (37 C) 98.6 F (37 C) 99.2 F (37.3 C) 98 F (36.7 C)  TempSrc: Oral Oral Oral Oral  SpO2: 98% 97% 100% 99%  Weight:      Height:        Intake/Output Summary (Last 24 hours) at 09/27/2017 1051 Last data filed at 09/27/2017 0500 Gross per 24 hour  Intake 880 ml  Output 735 ml  Net 145 ml   Filed Weights   09/24/17 0453 09/25/17 0509 09/25/17 1821  Weight: 110.7 kg (244 lb 0.8 oz) 108.9 kg (240 lb 1.3 oz) 108.9 kg (240 lb 1.3 oz)    Examination:  General exam: NAD Respiratory system: Lungs clear to auscultation bilaterally.  No wheezes, no crackles, no rhonchi.  Respiratory effort normal. Cardiovascular system: Regular rate rhythm no murmurs rubs or gallops.  1+ bilateral lower extremity edema.  No JVD.  Gastrointestinal system: Abdomen is soft, nontender, nondistended, positive bowel sounds.  JP drain in place and draining.  Umbilicus with dried blood noted and dressing in place.   Central nervous system: Alert and oriented.  Moving extremities spontaneously.  No focal deficits.   Extremities: Symmetric 5 x 5 power. Skin: No rashes, lesions or ulcers Psychiatry: Judgement and insight appear normal. Mood & affect appropriate.     Data Reviewed: I have personally reviewed following labs and imaging studies  CBC: Recent Labs  Lab 09/23/17 0508 09/24/17 0539 09/25/17 0537 09/26/17 0003 09/27/17 0410  WBC 10.7* 8.2 9.3 11.9* 12.1*  NEUTROABS  --  5.9  --   --   --   HGB 10.5* 11.7* 12.1* 12.2* 11.7*  HCT 30.6* 33.6* 35.3* 35.3* 34.2*  MCV 83.8 83.8 84.0 83.6 84.0  PLT 156 198 190 224 595   Basic Metabolic Panel: Recent Labs  Lab 09/21/17 0425  09/23/17 0508 09/24/17 0539 09/25/17 0537 09/26/17 0003 09/27/17 0410  NA 139   < > 136 135 137 135 134*  K 3.9   < > 3.6 3.7 3.3* 3.6 4.2  CL 106   < > 106 107 107 104 103  CO2  26   < > 24 23 25 26 25   GLUCOSE 169*   < > 200* 186* 92 90 129*  BUN 39*   < > 54* 30* 22* 18 20  CREATININE 1.46*   < > 1.41* 0.92 0.90 0.92 0.88  CALCIUM 7.8*   < > 7.0* 7.3* 7.8* 7.9* 7.8*  MG 1.9  --  2.0  --  2.2 1.9  --   PHOS  --   --   --   --  2.2*  --   --    < > = values in this interval not displayed.   GFR: Estimated Creatinine Clearance: 111.7 mL/min (by C-G formula based on SCr of 0.88 mg/dL). Liver Function Tests: Recent Labs  Lab 09/21/17 0425 09/22/17 0516 09/23/17 0508 09/24/17 0539 09/26/17 0003  AST 181* 199* 99* 72* 152*  ALT 171* 199* 133* 110* 153*  ALKPHOS 84 92 78 79 83  BILITOT 1.3*  1.1 0.7 0.3 0.7  PROT 6.3* 5.4* 5.2* 5.5* 5.7*  ALBUMIN 2.8* 2.5* 2.4* 2.5* 2.5*   Recent Labs  Lab 09/22/17 0516  LIPASE 20   No results for input(s): AMMONIA in the last 168 hours. Coagulation Profile: No results for input(s): INR, PROTIME in the last 168 hours. Cardiac Enzymes: No results for input(s): CKTOTAL, CKMB, CKMBINDEX, TROPONINI in the last 168 hours. BNP (last 3 results) No results for input(s): PROBNP in the last 8760 hours. HbA1C: No results for input(s): HGBA1C in the last 72 hours. CBG: Recent Labs  Lab 09/26/17 0952 09/26/17 1310 09/26/17 1741 09/26/17 2210 09/27/17 0729  GLUCAP 138* 138* 124* 119* 113*   Lipid Profile: No results for input(s): CHOL, HDL, LDLCALC, TRIG, CHOLHDL, LDLDIRECT in the last 72 hours. Thyroid Function Tests: No results for input(s): TSH, T4TOTAL, FREET4, T3FREE, THYROIDAB in the last 72 hours. Anemia Panel: No results for input(s): VITAMINB12, FOLATE, FERRITIN, TIBC, IRON, RETICCTPCT in the last 72 hours. Sepsis Labs: No results for input(s): PROCALCITON, LATICACIDVEN in the last 168 hours.  Recent Results (from the past 240 hour(s))  Blood culture (routine x 2)     Status: None   Collection Time: 09/19/17  6:44 AM  Result Value Ref Range Status   Specimen Description   Final    BLOOD LEFT  HAND Performed at [facility] [address] [place], [place] [address]    Special Requests   Final    BOTTLES DRAWN AEROBIC AND ANAEROBIC Blood Culture adequate volume Performed at [facility] [address], [place], [place] [address]    Culture   Final    NO GROWTH 5 DAYS Performed at [facility], [place] [address], [place], [place] [address]    Report Status 09/24/2017 FINAL  Final  Blood culture (routine x 2)     Status: None   Collection Time: 09/19/17  6:51 AM  Result Value Ref Range Status   Specimen Description   Final    BLOOD RIGHT HAND Performed at [facility] [address], [place], [place] [address]    Special Requests   Final    BOTTLES DRAWN AEROBIC AND ANAEROBIC Blood Culture adequate volume Performed at [facility] [address] [place], [place] [address]    Culture   Final    NO GROWTH 5 DAYS Performed at [facility], [place] [address] [place], [place] [address]    Report Status 09/24/2017 FINAL  Final  MRSA PCR Screening     Status: None   Collection Time: 09/20/17  8:05 AM  Result Value Ref Range Status   MRSA by PCR NEGATIVE NEGATIVE Final    Comment:        The GeneXpert MRSA Assay (FDA approved for NASAL specimens only), is one component of a comprehensive MRSA colonization surveillance program. It is not intended to diagnose MRSA infection nor to guide or monitor treatment for MRSA infections. Performed at [facility], [place] [address] [place], [place] [address]          Radiology Studies: Dg Abd 2 Views  Result Date: 09/26/2017 CLINICAL DATA:  Abdominal distention EXAM: ABDOMEN - 2 VIEW COMPARISON:  CT 09/19/2017 FINDINGS: Prior cholecystectomy. Nonobstructive bowel gas pattern with gas throughout nondistended large and small bowel. No free air organomegaly. IMPRESSION: Changes of cholecystectomy.  No obstruction or free air.  Electronically Signed   By: Rolm Baptise M.D.  On: 09/26/2017 12:02        Scheduled Meds: . acetaminophen  1,000 mg Oral TID  . amLODipine  5 mg Oral Daily  . aspirin  81 mg Oral Daily  . atorvastatin  40 mg Oral q1800  . dorzolamide  1 drop Right Eye BID  . enoxaparin (LOVENOX) injection  40 mg Subcutaneous Q24H  . feeding supplement (GLUCERNA SHAKE)  237 mL Oral BID BM  . gabapentin  300 mg Oral QHS  . hydrochlorothiazide  12.5 mg Oral Daily  . insulin aspart  0-5 Units Subcutaneous QHS  . insulin aspart  0-9 Units Subcutaneous TID WC  . insulin aspart  3 Units Subcutaneous TID WC  . insulin glargine  12 Units Subcutaneous BID  . lip balm  1 application Topical BID  . potassium chloride  40 mEq Oral BID  . psyllium  1 packet Oral BID  . saccharomyces boulardii  250 mg Oral BID  . simethicone  160 mg Oral TID  . timolol  1 drop Right Eye BID   Continuous Infusions: . piperacillin-tazobactam (ZOSYN)  IV Stopped (09/27/17 4961)     LOS: 8 days    Time spent: 67 Minutes    Irine Seal, MD [facility] Pager [phone]  If 7PM-7AM, please contact night-coverage [email or web address] Password [facility] 09/27/2017, 10:51 AM

## 2017-09-28 LAB — GLUCOSE, CAPILLARY
GLUCOSE-CAPILLARY: 93 mg/dL (ref 65–99)
GLUCOSE-CAPILLARY: 56 mg/dL — AB (ref 65–99)
GLUCOSE-CAPILLARY: 91 mg/dL (ref 65–99)
GLUCOSE-CAPILLARY: 91 mg/dL (ref 65–99)
Glucose-Capillary: 70 mg/dL (ref 65–99)

## 2017-09-28 LAB — CBC
HCT: 35.7 % — ABNORMAL LOW (ref 39.0–52.0)
Hemoglobin: 12.2 g/dL — ABNORMAL LOW (ref 13.0–17.0)
MCH: 29 pg (ref 26.0–34.0)
MCHC: 34.2 g/dL (ref 30.0–36.0)
MCV: 85 fL (ref 78.0–100.0)
PLATELETS: 200 10*3/uL (ref 150–400)
RBC: 4.2 MIL/uL — AB (ref 4.22–5.81)
RDW: 13.2 % (ref 11.5–15.5)
WBC: 12.3 10*3/uL — AB (ref 4.0–10.5)

## 2017-09-28 LAB — BASIC METABOLIC PANEL
ANION GAP: 9 (ref 5–15)
BUN: 16 mg/dL (ref 6–20)
CO2: 21 mmol/L — ABNORMAL LOW (ref 22–32)
Calcium: 8.1 mg/dL — ABNORMAL LOW (ref 8.9–10.3)
Chloride: 101 mmol/L (ref 101–111)
Creatinine, Ser: 0.81 mg/dL (ref 0.61–1.24)
GFR calc Af Amer: >60 mL/min (ref >60)
Glucose, Bld: 102 mg/dL — ABNORMAL HIGH (ref 65–99)
POTASSIUM: 3.9 mmol/L (ref 3.5–5.1)
SODIUM: 131 mmol/L — AB (ref 135–145)

## 2017-09-28 MED ORDER — PANTOPRAZOLE SODIUM 40 MG PO TBEC
40.0000 mg | DELAYED_RELEASE_TABLET | Freq: Every day | ORAL | Status: DC
  Administered 2017-09-28 – 2017-10-01 (×4): 40 mg via ORAL
  Filled 2017-09-28 (×4): qty 1

## 2017-09-28 MED ORDER — GI COCKTAIL ~~LOC~~
30.0000 mL | Freq: Three times a day (TID) | ORAL | Status: DC | PRN
  Filled 2017-09-28: qty 30

## 2017-09-28 MED ORDER — AMLODIPINE BESYLATE 10 MG PO TABS
10.0000 mg | ORAL_TABLET | Freq: Every day | ORAL | Status: DC
  Administered 2017-09-28 – 2017-10-01 (×4): 10 mg via ORAL
  Filled 2017-09-28 (×4): qty 1

## 2017-09-28 MED ORDER — INSULIN GLARGINE 100 UNIT/ML ~~LOC~~ SOLN
12.0000 [IU] | Freq: Every day | SUBCUTANEOUS | Status: DC
  Administered 2017-10-01: 12 [IU] via SUBCUTANEOUS
  Filled 2017-09-28 (×3): qty 0.12

## 2017-09-28 MED ORDER — HYDRALAZINE HCL 20 MG/ML IJ SOLN: 10.0000 mg | Freq: Three times a day (TID) | INTRAMUSCULAR | Status: DC | PRN

## 2017-09-28 MED ORDER — PSYLLIUM 95 % PO PACK
1.0000 | PACK | Freq: Two times a day (BID) | ORAL | Status: DC
  Administered 2017-09-28 – 2017-10-01 (×2): 1 via ORAL
  Filled 2017-09-28 (×4): qty 1

## 2017-09-28 NOTE — Progress Notes (Addendum)
PROGRESS NOTE    Derek Lowery  ZES:[id number] DOB: 28-Jun-1956 DOA: 09/18/2017 PCP: Ladell Pier, MD   Brief Narrative:   62 y/o male with PMH of HTN, DM, CAD s/p stent (2012), presented with progressively worsening epigastric, RUQ abdominal pains, nausea, vomiting and being admitted for cholecystectomy.  Hospitalist team was consulted to manage diabetes mellitus.      Assessment & Plan:   Principal Problem:   Acute gangrenous cholecystitis s/p lap cholecystectomy 09/21/2017 Active Problems:   CAD in native artery   Essential hypertension   Diabetic polyneuropathy associated with type 2 diabetes mellitus (HCC)   Retinopathy due to secondary diabetes ([place])   ARF (acute renal failure) ([place])  1 acute gangrenous cholecystitis Patient was initially admitted under the general surgical team for cholecystectomy secondary to acute cholecystitis.  Patient status post laparoscopic cholecystectomy per Dr. Johney Maine 09/21/2017, was taken to the operating room.  JP drain left in place.  Patient status post 5 days of antibiotics postop and as such discontinued 09/27/2017.  Patient with nausea and emesis overnight.  Abdominal films unremarkable.  Patient was given some IV Ativan which resolved his nausea and anxiety 2 days ago.  Will place on a PPI twice daily due to concerns for possible GERD causing his nausea. General surgery following and appreciate input and recommendations.  Supportive care.  2.  Diabetes mellitus type 2 poorly controlled with diabetic retinopathy and peripheral neuropathy Hemoglobin A1c was 7.6 on 06/09/2017.  Repeat hemoglobin A1c is 8.2.  CBGs have ranged from 56-91.  Patient with nausea and emesis overnight.  Patient hesitant to eat.  Improvement with his nausea.  Patient hesitant to eat this morning.  Change Lantus to daily.  Discontinue meal coverage insulin.  Continue sliding scale insulin.  Place on a PPI.  3.  Hypertension Patient noted to be hypotensive earlier on  during the hospitalization.  IV fluids have been discontinued.  Antihypertensive medications of HCTZ and Norvasc have been resumed.  4.  Right bundle branch block Patient has been seen by cardiology patient noted to have no cardiac issues or arrhythmias during the hospitalization.  2D echo done was normal.  Cardiology.  5.  Hypokalemia  Repleted  6.  Hyperlipidemia Continue statin.  7.  Mild chronic diastolic CHF Compensated.  Patient auto diuresing.  Strict I's and O's.  Daily weights.  Follow.   8.  Acute renal failure Initially felt to be secondary to dehydration.  ACE inhibitor discontinued.  Patient was on IV fluids which have subsequently been discontinued.  Renal ultrasound negative for hydronephrosis.  Renal function improving daily.  Good urine output.  Follow.    9.  Right foot ulcer on plantar aspect of first big toe Continue current wound care.  Outpatient follow-up.     DVT prophylaxis: Lovenox Code Status: Full Family Communication: Updated patient.  No family at bedside. Disposition Plan: Home when no further emesis tolerating oral intake and okay with surgery hopefully in the next 24 hours.    Consultants:   Cardiology: Dr. Skeet Latch 09/20/2017  [facility] hospitalist: Dr. Daleen Bo 09/19/2017  Procedures:  CT abdomen and pelvis 09/19/2017  Chest x-ray 09/21/2017  Acute abdominal series 09/19/2017  Renal ultrasound 09/21/2017  Right upper quadrant abdominal ultrasound 09/19/2017  2D echo 09/21/2017  Laparoscopic cholecystectomy per Dr. Johney Maine 09/21/2017  Antimicrobials:   IV Rocephin 09/19/2017>>>>> 09/21/2017  IV Zosyn 09/21/2017>>>>>>> 09/27/2017     Subjective: Patient states had some nausea and emesis last night.  Patient complaining of  belching.  Denies any chest pain or shortness of breath.  No abdominal pain.  States he is feeling a little bit better than he did last night.    Objective: Vitals:   09/27/17 2155 09/28/17 0448 09/28/17 0456 09/28/17 0912    BP: (!) 178/84 (!) 179/84  (!) 172/84  Pulse: 82 73    Resp: 18 20    Temp: 99.4 F (37.4 C) 98.8 F (37.1 C)    TempSrc: Oral Oral    SpO2: 96% 99%    Weight:   99 kg (218 lb 4.1 oz)   Height:        Intake/Output Summary (Last 24 hours) at 09/28/2017 1126 Last data filed at 09/28/2017 0600 Gross per 24 hour  Intake 150 ml  Output 150 ml  Net 0 ml   Filed Weights   09/25/17 1821 09/27/17 1850 09/28/17 0456  Weight: 108.9 kg (240 lb 1.3 oz) 97.6 kg (215 lb 3.2 oz) 99 kg (218 lb 4.1 oz)    Examination:  General exam: NAD Respiratory system: CTA B.  No crackles, no rhonchi, no wheezing.  Normal respiratory effort.  Cardiovascular system: RRR.  No murmurs rubs or gallops.  No JVD.  Trace to 1+ lower extremity edema.   Gastrointestinal system: Abdomen is nontender, nondistended, soft, positive bowel sounds. JP drain in place and draining.  Umbilicus with dried blood noted and dressing in place.   Central nervous system: Alert and oriented.  Moving extremities spontaneously.  No focal deficits.   Extremities: Symmetric 5 x 5 power. Skin: No rashes, lesions or ulcers Psychiatry: Judgement and insight appear normal. Mood & affect appropriate.     Data Reviewed: I have personally reviewed following labs and imaging studies  CBC: Recent Labs  Lab 09/24/17 0539 09/25/17 0537 09/26/17 0003 09/27/17 0410 09/28/17 0417  WBC 8.2 9.3 11.9* 12.1* 12.3*  NEUTROABS 5.9  --   --   --   --   HGB 11.7* 12.1* 12.2* 11.7* 12.2*  HCT 33.6* 35.3* 35.3* 34.2* 35.7*  MCV 83.8 84.0 83.6 84.0 85.0  PLT 198 190 224 190 993   Basic Metabolic Panel: Recent Labs  Lab 09/23/17 0508 09/24/17 0539 09/25/17 0537 09/26/17 0003 09/27/17 0410 09/28/17 0417  NA 136 135 137 135 134* 131*  K 3.6 3.7 3.3* 3.6 4.2 3.9  CL 106 107 107 104 103 101  CO2 24 23 25 26 25  21*  GLUCOSE 200* 186* 92 90 129* 102*  BUN 54* 30* 22* 18 20 16   CREATININE 1.41* 0.92 0.90 0.92 0.88 0.81  CALCIUM 7.0* 7.3*  7.8* 7.9* 7.8* 8.1*  MG 2.0  --  2.2 1.9  --   --   PHOS  --   --  2.2*  --   --   --    GFR: Estimated Creatinine Clearance: 115.8 mL/min (by C-G formula based on SCr of 0.81 mg/dL). Liver Function Tests: Recent Labs  Lab 09/22/17 0516 09/23/17 0508 09/24/17 0539 09/26/17 0003  AST 199* 99* 72* 152*  ALT 199* 133* 110* 153*  ALKPHOS 92 78 79 83  BILITOT 1.1 0.7 0.3 0.7  PROT 5.4* 5.2* 5.5* 5.7*  ALBUMIN 2.5* 2.4* 2.5* 2.5*   Recent Labs  Lab 09/22/17 0516  LIPASE 20   No results for input(s): AMMONIA in the last 168 hours. Coagulation Profile: No results for input(s): INR, PROTIME in the last 168 hours. Cardiac Enzymes: No results for input(s): CKTOTAL, CKMB, CKMBINDEX, TROPONINI in the last 168  hours. BNP (last 3 results) No results for input(s): PROBNP in the last 8760 hours. HbA1C: No results for input(s): HGBA1C in the last 72 hours. CBG: Recent Labs  Lab 09/27/17 0729 09/27/17 1240 09/27/17 1709 09/27/17 2152 09/28/17 0745  GLUCAP 113* 113* 105* 115* 93   Lipid Profile: No results for input(s): CHOL, HDL, LDLCALC, TRIG, CHOLHDL, LDLDIRECT in the last 72 hours. Thyroid Function Tests: No results for input(s): TSH, T4TOTAL, FREET4, T3FREE, THYROIDAB in the last 72 hours. Anemia Panel: No results for input(s): VITAMINB12, FOLATE, FERRITIN, TIBC, IRON, RETICCTPCT in the last 72 hours. Sepsis Labs: No results for input(s): PROCALCITON, LATICACIDVEN in the last 168 hours.  Recent Results (from the past 240 hour(s))  Blood culture (routine x 2)     Status: None   Collection Time: 09/19/17  6:44 AM  Result Value Ref Range Status   Specimen Description   Final    BLOOD LEFT HAND Performed at [facility] [address], [place], [place] [address]    Special Requests   Final    BOTTLES DRAWN AEROBIC AND ANAEROBIC Blood Culture adequate volume Performed at [facility] [address] [place], [place] [address]     Culture   Final    NO GROWTH 5 DAYS Performed at [facility], [place] [address], [place], [place] [address]    Report Status 09/24/2017 FINAL  Final  Blood culture (routine x 2)     Status: None   Collection Time: 09/19/17  6:51 AM  Result Value Ref Range Status   Specimen Description   Final    BLOOD RIGHT HAND Performed at [facility] [address], [place], [place] [address]    Special Requests   Final    BOTTLES DRAWN AEROBIC AND ANAEROBIC Blood Culture adequate volume Performed at [facility] [address] [place], [place] [address]    Culture   Final    NO GROWTH 5 DAYS Performed at [facility], [place] [address], [place], [place] [address]    Report Status 09/24/2017 FINAL  Final  MRSA PCR Screening     Status: None   Collection Time: 09/20/17  8:05 AM  Result Value Ref Range Status   MRSA by PCR NEGATIVE NEGATIVE Final    Comment:        The GeneXpert MRSA Assay (FDA approved for NASAL specimens only), is one component of a comprehensive MRSA colonization surveillance program. It is not intended to diagnose MRSA infection nor to guide or monitor treatment for MRSA infections. Performed at [facility], [place] [address] [place],  [address]          Radiology Studies: Dg Abd 2 Views  Result Date: 09/26/2017 CLINICAL DATA:  Abdominal distention EXAM: ABDOMEN - 2 VIEW COMPARISON:  CT 09/19/2017 FINDINGS: Prior cholecystectomy. Nonobstructive bowel gas pattern with gas throughout nondistended large and small bowel. No free air organomegaly. IMPRESSION: Changes of cholecystectomy.  No obstruction or free air. Electronically Signed   By: Rolm Baptise M.D.   On: 09/26/2017 12:02        Scheduled Meds: . acetaminophen  1,000 mg Oral TID  . amLODipine  10 mg Oral Daily  . aspirin  81 mg Oral Daily  . atorvastatin  40 mg Oral q1800  . dorzolamide  1 drop Right Eye BID  . enoxaparin  (LOVENOX) injection  40 mg Subcutaneous Q24H  . feeding supplement ([place])  237 mL Oral BID BM  . hydrochlorothiazide  12.5 mg Oral Daily  . insulin aspart  0-5 Units Subcutaneous QHS  . insulin aspart  0-9 Units Subcutaneous TID WC  . insulin aspart  3 Units Subcutaneous TID WC  . insulin glargine  12 Units Subcutaneous BID  . lip balm  1 application Topical BID  . psyllium  1 packet Oral BID  . saccharomyces boulardii  250 mg Oral BID  . simethicone  160 mg Oral TID  . timolol  1 drop Right Eye BID   Continuous Infusions:    LOS: 9 days    Time spent: 49 Minutes    Irine Seal, MD [facility] Pager [phone]  If 7PM-7AM, please contact night-coverage [email or web address] Password [facility] 09/28/2017, 11:26 AM

## 2017-09-28 NOTE — Progress Notes (Signed)
7 Days Post-Op    CC: Abdominal pain   Subjective: Patient lying in bed said he had one episode of emesis last evening.  Port sites look good.  Drainage from the JP is serosanguineous.  He is tolerating a carb modified diet.  He is having some bowel movements although he sounds somewhat constipated.  Port site looks fine.  Drain site looks fine. Objective: Vital signs in last 24 hours: Temp:  [98.8 F (37.1 C)-99.5 F (37.5 C)] 98.8 F (37.1 C) (02/11 0448) Pulse Rate:  [63-82] 73 (02/11 0448) Resp:  [18-20] 20 (02/11 0448) BP: (117-179)/(58-84) 179/84 (02/11 0448) SpO2:  [96 %-99 %] 99 % (02/11 0448) Weight:  [97.6 kg (215 lb 3.2 oz)-99 kg (218 lb 4.1 oz)] 99 kg (218 lb 4.1 oz) (02/11 0456) Last BM Date: 09/25/17 150 PO recorded Voided x 3 recorded Emeisis x 50 ml Drain 120 Afebrile, BP still up some WBC still up 12.3 Na 131  Intake/Output from previous day: 02/10 0701 - 02/11 0700 In: 150 [P.O.:150] Out: 170 [Emesis/NG output:50; Drains:120] Intake/Output this shift: No intake/output data recorded.  General appearance: alert, cooperative and no distress Resp: clear to auscultation bilaterally GI: Soft, sore, port site looks fine.  JP drain is clear serosanguineous fluid.  Lab Results:  Recent Labs    09/27/17 0410 09/28/17 0417  WBC 12.1* 12.3*  HGB 11.7* 12.2*  HCT 34.2* 35.7*  PLT 190 200    BMET Recent Labs    09/27/17 0410 09/28/17 0417  NA 134* 131*  K 4.2 3.9  CL 103 101  CO2 25 21*  GLUCOSE 129* 102*  BUN 20 16  CREATININE 0.88 0.81  CALCIUM 7.8* 8.1*   PT/INR No results for input(s): LABPROT, INR in the last 72 hours.  Recent Labs  Lab 09/22/17 0516 09/23/17 0508 09/24/17 0539 09/26/17 0003  AST 199* 99* 72* 152*  ALT 199* 133* 110* 153*  ALKPHOS 92 78 79 83  BILITOT 1.1 0.7 0.3 0.7  PROT 5.4* 5.2* 5.5* 5.7*  ALBUMIN 2.5* 2.4* 2.5* 2.5*     Lipase     Component Value Date/Time   LIPASE 20 09/22/2017 0516      Medications: . acetaminophen  1,000 mg Oral TID  . amLODipine  10 mg Oral Daily  . aspirin  81 mg Oral Daily  . atorvastatin  40 mg Oral q1800  . dorzolamide  1 drop Right Eye BID  . enoxaparin (LOVENOX) injection  40 mg Subcutaneous Q24H  . feeding supplement (GLUCERNA SHAKE)  237 mL Oral BID BM  . hydrochlorothiazide  12.5 mg Oral Daily  . insulin aspart  0-5 Units Subcutaneous QHS  . insulin aspart  0-9 Units Subcutaneous TID WC  . insulin aspart  3 Units Subcutaneous TID WC  . insulin glargine  12 Units Subcutaneous BID  . lip balm  1 application Topical BID  . psyllium  1 packet Oral BID  . saccharomyces boulardii  250 mg Oral BID  . simethicone  160 mg Oral TID  . timolol  1 drop Right Eye BID    Anti-infectives (From admission, onward)   Start     Dose/Rate Route Frequency Ordered Stop   09/21/17 1500  clindamycin (CLEOCIN) 900 mg, gentamicin (GARAMYCIN) 240 mg in sodium chloride 0.9 % 1,000 mL for intraperitoneal lavage      Intraperitoneal To Surgery 09/21/17 1427 09/21/17 1455   09/21/17 1030  piperacillin-tazobactam (ZOSYN) IVPB 3.375 g  Status:  Discontinued  3.375 g 12.5 mL/hr over 240 Minutes Intravenous Every 8 hours 09/21/17 0930 09/27/17 1053   09/21/17 0000  amoxicillin-clavulanate (AUGMENTIN) 875-125 MG tablet     1 tablet Oral 2 times daily 09/21/17 1549     09/19/17 1200  cefTRIAXone (ROCEPHIN) 2 g in dextrose 5 % 50 mL IVPB  Status:  Discontinued     2 g 100 mL/hr over 30 Minutes Intravenous Every 24 hours 09/19/17 1120 09/21/17 0930      Assessment/Plan Acute gangrenous cholecystitis S/P laparoscopic cholecystectomy and drain placement 09/21/17, Dr. Alwyn Pea POD 7  - Drain his stay in for 2 weeks -over concern for possible cystic duct leak  - Follow-up 1 week in the [place] clinic and 2 weeks to see Dr. Johney Maine  Mild renal insuffiencey - creatinine 0.68 - 1.46 -  1.49 LFT's are up - bilirubin is stable  Leukocytosis - 23K =>> 12.3 today - off  antibiotics Poorly controlled insulin-dependent diabetes- A1C 7.6 Acute kidney injury  - resolved Diabetic retinopathy/perpherial neuropathy History of coronary artery disease/NSTEMI -status post stent11/2012- OK for surgery per Cardiology - restart ASA 81 after surgery - Echo =>> EF 60-65%; elevated ventricular end-diastolic filling pressure and elevated left atrial filling pressure. Hyperlipidemia Hypertension-poorly controlled Malnutrition  = prealbumin 15.9 on 09/26/17  FEN: IV fluids/Clear diet =>> heart healthy, soft diet ID: rocephin 2/2 - day 3 WBC up =>>Zosyn 2/4 -patient needs the IV antibiotics for an additional 5 days through 09/26/17. DVT: Lovenox Foley: None Follow up DOW  Plan:  He can go when medically stable.  He has a follow-up in our clinic next week at which time his drain can be removed if it remains clear serosanguineous fluid.  Port sites are all healing nicely.  I did increase his Metamucil to twice daily and recommended a giving the sugar-free.      LOS: 9 days    Amanuel Sinkfield 09/28/2017 [phone]

## 2017-09-29 ENCOUNTER — Inpatient Hospital Stay (HOSPITAL_COMMUNITY): Payer: Medicare Other

## 2017-09-29 LAB — GLUCOSE, CAPILLARY
GLUCOSE-CAPILLARY: 145 mg/dL — AB (ref 65–99)
GLUCOSE-CAPILLARY: 81 mg/dL (ref 65–99)
Glucose-Capillary: 92 mg/dL (ref 65–99)
Glucose-Capillary: 94 mg/dL (ref 65–99)

## 2017-09-29 LAB — BASIC METABOLIC PANEL
ANION GAP: 11 (ref 5–15)
BUN: 13 mg/dL (ref 6–20)
CALCIUM: 8.6 mg/dL — AB (ref 8.9–10.3)
CO2: 21 mmol/L — AB (ref 22–32)
Chloride: 102 mmol/L (ref 101–111)
Creatinine, Ser: 0.73 mg/dL (ref 0.61–1.24)
Glucose, Bld: 97 mg/dL (ref 65–99)
POTASSIUM: 4.2 mmol/L (ref 3.5–5.1)
Sodium: 134 mmol/L — ABNORMAL LOW (ref 135–145)

## 2017-09-29 LAB — CBC
HEMATOCRIT: 36.1 % — AB (ref 39.0–52.0)
HEMOGLOBIN: 12.4 g/dL — AB (ref 13.0–17.0)
MCH: 28.9 pg (ref 26.0–34.0)
MCHC: 34.3 g/dL (ref 30.0–36.0)
MCV: 84.1 fL (ref 78.0–100.0)
Platelets: 207 10*3/uL (ref 150–400)
RBC: 4.29 MIL/uL (ref 4.22–5.81)
RDW: 13.2 % (ref 11.5–15.5)
WBC: 11.6 10*3/uL — AB (ref 4.0–10.5)

## 2017-09-29 MED ORDER — IOPAMIDOL (ISOVUE-300) INJECTION 61%
INTRAVENOUS | Status: AC
  Filled 2017-09-29: qty 100

## 2017-09-29 MED ORDER — IOPAMIDOL (ISOVUE-300) INJECTION 61%: 15.0000 mL | Freq: Once | INTRAVENOUS | Status: AC | PRN

## 2017-09-29 MED ORDER — HYDROCHLOROTHIAZIDE 25 MG PO TABS
25.0000 mg | ORAL_TABLET | Freq: Every day | ORAL | Status: DC
  Administered 2017-09-29 – 2017-10-01 (×3): 25 mg via ORAL
  Filled 2017-09-29 (×3): qty 1

## 2017-09-29 MED ORDER — IOPAMIDOL (ISOVUE-300) INJECTION 61%
INTRAVENOUS | Status: AC
  Administered 2017-09-29: 15 mL
  Filled 2017-09-29: qty 30

## 2017-09-29 MED ORDER — IOPAMIDOL (ISOVUE-300) INJECTION 61%
100.0000 mL | Freq: Once | INTRAVENOUS | Status: AC | PRN
  Administered 2017-09-29: 100 mL via INTRAVENOUS

## 2017-09-29 NOTE — Plan of Care (Signed)
  Activity: Risk for activity intolerance will decrease 09/29/2017 0048 - Progressing by Mickie Kay, RN

## 2017-09-29 NOTE — Progress Notes (Signed)
Buttocks cleansed and purple top criticaid cream applied.  Umbilical site cleansed with soap and water and patted dry.  JP site cleansed with soap and warm tap water and then 50% diluted H2O2, patted dry and dry dressing applied; secured with tape. JP milked. Wound to bottom of right foot cleansed with normal saline, patted dry, xeroform applied, gauze and wrapped with kerlix. Patient tolerated all wound care well. Donne Hazel, RN

## 2017-09-29 NOTE — Progress Notes (Signed)
PROGRESS NOTE    Derek Lowery  VFI:[id number] DOB: 1956-03-24 DOA: 09/18/2017 PCP: Ladell Pier, MD   Brief Narrative:   62 y/o male with PMH of HTN, DM, CAD s/p stent (2012), presented with progressively worsening epigastric, RUQ abdominal pains, nausea, vomiting and being admitted for cholecystectomy.  Hospitalist team was consulted to manage diabetes mellitus.      Assessment & Plan:   Principal Problem:   Acute gangrenous cholecystitis s/p lap cholecystectomy 09/21/2017 Active Problems:   CAD in native artery   Essential hypertension   Diabetic polyneuropathy associated with type 2 diabetes mellitus (HCC)   Retinopathy due to secondary diabetes ([place])   ARF (acute renal failure) ([place])  1 acute gangrenous cholecystitis Patient was initially admitted under the general surgical team for cholecystectomy secondary to acute cholecystitis.  Patient status post laparoscopic cholecystectomy per Dr. Johney Maine 09/21/2017, was taken to the operating room.  JP drain left in place.  Patient status post 5 days of antibiotics postop and as such discontinued 09/27/2017.  Patient with ongoing nausea to the point he is very hesitant to eat.  Abdominal films unremarkable.  Patient was given some IV Ativan which resolved his nausea and anxiety 2 days ago.  Continue PPI twice daily due to concerns for possible GERD causing his nausea.  CT abdomen and pelvis has been ordered per general surgery for further evaluation.  General surgery following and appreciate input and recommendations.  Supportive care.  2.  Diabetes mellitus type 2 poorly controlled with diabetic retinopathy and peripheral neuropathy Hemoglobin A1c was 7.6 on 06/09/2017.  Repeat hemoglobin A1c is 8.2.  CBGs have ranged from 92-145.  Patient with ongoing nausea.  Denies any further emesis. Patient hesitant to eat.  Lantus dose decreased to 12 units daily.  Continue coverage insulin discontinued.  Continue sliding scale insulin.  PPI.    3.  Hypertension Patient noted to be hypotensive earlier on during the hospitalization.  IV fluids have been discontinued.  Continue Norvasc.  Increase HCTZ to 25 mg daily for better blood pressure control.    4.  Right bundle branch block Patient has been seen by cardiology patient noted to have no cardiac issues or arrhythmias during the hospitalization.  2D echo done was normal.  Cardiology.  5.  Hypokalemia  Repleted  6.  Hyperlipidemia Continue statin.  7.  Mild chronic diastolic CHF Compensated. Strict I's and O's.  Daily weights.  Follow.   8.  Acute renal failure Initially felt to be secondary to dehydration.  ACE inhibitor discontinued.  Patient was on IV fluids which have subsequently been discontinued.  Renal ultrasound negative for hydronephrosis.  Renal function has improved.  With good urine output.  Monitor closely with increase in HCTZ.   9.  Right foot ulcer on plantar aspect of first big toe Continue current wound care.  Outpatient follow-up.     DVT prophylaxis: Lovenox Code Status: Full Family Communication: Updated patient.  No family at bedside. Disposition Plan: Home when no further nausea or emesis tolerating oral intake and okay with surgery.    Consultants:   Cardiology: Dr. Skeet Latch 09/20/2017  [facility] hospitalist: Dr. Daleen Bo 09/19/2017  Procedures:  CT abdomen and pelvis 09/19/2017  Chest x-ray 09/21/2017  Acute abdominal series 09/19/2017  Renal ultrasound 09/21/2017  Right upper quadrant abdominal ultrasound 09/19/2017  2D echo 09/21/2017  Laparoscopic cholecystectomy per Dr. Johney Maine 09/21/2017  CT abdomen and pelvis pending 09/29/2017  Antimicrobials:   IV Rocephin 09/19/2017>>>>> 09/21/2017  IV Zosyn  09/21/2017>>>>>>> 09/27/2017     Subjective: Patient In bed.  Patient with complaints of nausea.  Patient very hesitant to eat and has pretty much been ordering liquids.  Denies any chest pain.  No shortness of breath.  No abdominal pain.   States he feels he is slowly getting better.   Objective: Vitals:   09/28/17 2123 09/29/17 0540 09/29/17 0749 09/29/17 0935  BP: (!) 158/87 (!) 160/81 (!) 150/77 (!) 148/82  Pulse: 66 76 73 72  Resp: 17 17 16 16   Temp: 98.1 F (36.7 C) 98.6 F (37 C) 98.5 F (36.9 C)   TempSrc: Oral Oral Oral   SpO2: 99% 100% 100% 100%  Weight:  98 kg (216 lb 0.8 oz)    Height:        Intake/Output Summary (Last 24 hours) at 09/29/2017 1228 Last data filed at 09/29/2017 0810 Gross per 24 hour  Intake 690 ml  Output 125 ml  Net 565 ml   Filed Weights   09/27/17 1850 09/28/17 0456 09/29/17 0540  Weight: 97.6 kg (215 lb 3.2 oz) 99 kg (218 lb 4.1 oz) 98 kg (216 lb 0.8 oz)    Examination:  General exam: Sitting up in bed. Respiratory system: Lungs clear to auscultation bilaterally.  No wheezes, no crackles, no rhonchi.  Cardiovascular system: Regular rate and rhythm no murmurs rubs or gallops.  Trace lower extremity edema.  No JVD.  Gastrointestinal system: Abdomen is soft, nondistended, nontender, positive bowel sounds.  JP drain in place.  Umbilicus with no active bleeding dressing in place. Central nervous system: Alert and oriented x3.  Moving extremities spontaneously.  No focal deficits.   Extremities: Symmetric 5 x 5 power. Skin: No rashes, lesions or ulcers Psychiatry: Judgement and insight appear normal. Mood & affect appropriate.     Data Reviewed: I have personally reviewed following labs and imaging studies  CBC: Recent Labs  Lab 09/24/17 0539 09/25/17 0537 09/26/17 0003 09/27/17 0410 09/28/17 0417 09/29/17 0442  WBC 8.2 9.3 11.9* 12.1* 12.3* 11.6*  NEUTROABS 5.9  --   --   --   --   --   HGB 11.7* 12.1* 12.2* 11.7* 12.2* 12.4*  HCT 33.6* 35.3* 35.3* 34.2* 35.7* 36.1*  MCV 83.8 84.0 83.6 84.0 85.0 84.1  PLT 198 190 224 190 200 470   Basic Metabolic Panel: Recent Labs  Lab 09/23/17 0508  09/25/17 0537 09/26/17 0003 09/27/17 0410 09/28/17 0417 09/29/17 0442  NA  136   < > 137 135 134* 131* 134*  K 3.6   < > 3.3* 3.6 4.2 3.9 4.2  CL 106   < > 107 104 103 101 102  CO2 24   < > 25 26 25  21* 21*  GLUCOSE 200*   < > 92 90 129* 102* 97  BUN 54*   < > 22* 18 20 16 13   CREATININE 1.41*   < > 0.90 0.92 0.88 0.81 0.73  CALCIUM 7.0*   < > 7.8* 7.9* 7.8* 8.1* 8.6*  MG 2.0  --  2.2 1.9  --   --   --   PHOS  --   --  2.2*  --   --   --   --    < > = values in this interval not displayed.   GFR: Estimated Creatinine Clearance: 116.7 mL/min (by C-G formula based on SCr of 0.73 mg/dL). Liver Function Tests: Recent Labs  Lab 09/23/17 0508 09/24/17 0539 09/26/17 0003  AST 99* 72*  152*  ALT 133* 110* 153*  ALKPHOS 78 79 83  BILITOT 0.7 0.3 0.7  PROT 5.2* 5.5* 5.7*  ALBUMIN 2.4* 2.5* 2.5*   No results for input(s): LIPASE, AMYLASE in the last 168 hours. No results for input(s): AMMONIA in the last 168 hours. Coagulation Profile: No results for input(s): INR, PROTIME in the last 168 hours. Cardiac Enzymes: No results for input(s): CKTOTAL, CKMB, CKMBINDEX, TROPONINI in the last 168 hours. BNP (last 3 results) No results for input(s): PROBNP in the last 8760 hours. HbA1C: No results for input(s): HGBA1C in the last 72 hours. CBG: Recent Labs  Lab 09/28/17 1213 09/28/17 1635 09/28/17 2125 09/29/17 0736 09/29/17 1152  GLUCAP 70 91 91 92 145*   Lipid Profile: No results for input(s): CHOL, HDL, LDLCALC, TRIG, CHOLHDL, LDLDIRECT in the last 72 hours. Thyroid Function Tests: No results for input(s): TSH, T4TOTAL, FREET4, T3FREE, THYROIDAB in the last 72 hours. Anemia Panel: No results for input(s): VITAMINB12, FOLATE, FERRITIN, TIBC, IRON, RETICCTPCT in the last 72 hours. Sepsis Labs: No results for input(s): PROCALCITON, LATICACIDVEN in the last 168 hours.  Recent Results (from the past 240 hour(s))  MRSA PCR Screening     Status: None   Collection Time: 09/20/17  8:05 AM  Result Value Ref Range Status   MRSA by PCR NEGATIVE NEGATIVE Final     Comment:        The GeneXpert MRSA Assay (FDA approved for NASAL specimens only), is one component of a comprehensive MRSA colonization surveillance program. It is not intended to diagnose MRSA infection nor to guide or monitor treatment for MRSA infections. Performed at [facility], [place] [address], [place], [place] [address]          Radiology Studies: No results found.      Scheduled Meds: . acetaminophen  1,000 mg Oral TID  . amLODipine  10 mg Oral Daily  . aspirin  81 mg Oral Daily  . atorvastatin  40 mg Oral q1800  . dorzolamide  1 drop Right Eye BID  . enoxaparin (LOVENOX) injection  40 mg Subcutaneous Q24H  . feeding supplement (GLUCERNA SHAKE)  237 mL Oral BID BM  . hydrochlorothiazide  25 mg Oral Daily  . insulin aspart  0-5 Units Subcutaneous QHS  . insulin aspart  0-9 Units Subcutaneous TID WC  . insulin glargine  12 Units Subcutaneous Daily  . iopamidol      . lip balm  1 application Topical BID  . pantoprazole  40 mg Oral Q0600  . psyllium  1 packet Oral BID  . saccharomyces boulardii  250 mg Oral BID  . timolol  1 drop Right Eye BID   Continuous Infusions:    LOS: 10 days    Time spent: 33 Minutes    Irine Seal, MD [facility] Pager [phone]  If 7PM-7AM, please contact night-coverage [email or web address] Password [facility] 09/29/2017, 12:28 PM

## 2017-09-29 NOTE — Progress Notes (Signed)
8 Days Post-Op    CC: Abdominal pain  Subjective: Patient's reports he is just drinking mostly liquids like chicken broth and some full liquids like sherbet.  Otherwise he feels like he is going to get sick.  His abdomen is soft.  The port site looks fine.  He does have some lower abdominal ecchymosis.  I cannot tell whether this is from the anticoagulation for DVT or as a result of the surgery.  It is primarily in the left lower abdominal wall.  He also had a bowel movement this a.m.  Objective: Vital signs in last 24 hours: Temp:  [98.1 F (36.7 C)-98.6 F (37 C)] 98.5 F (36.9 C) (02/12 0749) Pulse Rate:  [66-76] 73 (02/12 0749) Resp:  [16-18] 16 (02/12 0749) BP: (150-172)/(77-87) 150/77 (02/12 0749) SpO2:  [99 %-100 %] 100 % (02/12 0749) Weight:  [98 kg (216 lb 0.8 oz)] 98 kg (216 lb 0.8 oz) (02/12 0540) Last BM Date: 09/25/17 450 PO Urine x 3 recorded Drain 125 Afebrile, VSS Labs OK; Hemoglobin is stable at 12.2-12.4 range; on admission his hemoglobin was 13.5. WBC trending down 11.6 today     Intake/Output from previous day: 02/11 0701 - 02/12 0700 In: 450 [P.O.:450] Out: 125 [Drains:125] Intake/Output this shift: Total I/O In: 240 [P.O.:240] Out: -   General appearance: alert, cooperative and no distress Resp: clear to auscultation bilaterally GI: soft, drain is serosanguinous, Port site looks fine.  Some ecchymosis lower left abdominal wall.   There is some dried blood in the port site after Steri strips removed, but otherwise looks normal.  Drain is serosanguineous.  Lower abdominal ecchymosis with a stable hemoglobin and hematocrit.  I do not have a good explanation for his ongoing episodes of nausea.   Lab Results:  Recent Labs    09/28/17 0417 09/29/17 0442  WBC 12.3* 11.6*  HGB 12.2* 12.4*  HCT 35.7* 36.1*  PLT 200 207    BMET Recent Labs    09/28/17 0417 09/29/17 0442  NA 131* 134*  K 3.9 4.2  CL 101 102  CO2 21* 21*  GLUCOSE 102* 97   BUN 16 13  CREATININE 0.81 0.73  CALCIUM 8.1* 8.6*   PT/INR No results for input(s): LABPROT, INR in the last 72 hours.  Recent Labs  Lab 09/23/17 0508 09/24/17 0539 09/26/17 0003  AST 99* 72* 152*  ALT 133* 110* 153*  ALKPHOS 78 79 83  BILITOT 0.7 0.3 0.7  PROT 5.2* 5.5* 5.7*  ALBUMIN 2.4* 2.5* 2.5*     Lipase     Component Value Date/Time   LIPASE 20 09/22/2017 0516     Medications: . acetaminophen  1,000 mg Oral TID  . amLODipine  10 mg Oral Daily  . aspirin  81 mg Oral Daily  . atorvastatin  40 mg Oral q1800  . dorzolamide  1 drop Right Eye BID  . enoxaparin (LOVENOX) injection  40 mg Subcutaneous Q24H  . feeding supplement (GLUCERNA SHAKE)  237 mL Oral BID BM  . hydrochlorothiazide  25 mg Oral Daily  . insulin aspart  0-5 Units Subcutaneous QHS  . insulin aspart  0-9 Units Subcutaneous TID WC  . insulin glargine  12 Units Subcutaneous Daily  . lip balm  1 application Topical BID  . pantoprazole  40 mg Oral Q0600  . psyllium  1 packet Oral BID  . saccharomyces boulardii  250 mg Oral BID  . timolol  1 drop Right Eye BID    Assessment/Plan  Acute gangrenous cholecystitis S/P laparoscopic cholecystectomy and drain placement 09/21/17, Dr. Alwyn Pea POD 7 -Drain his stay in for 2 weeks-over concern for possible cystic duct leak -Follow-up 1 week in the [facility] 2/19W 8:45 AM;  and 2 weeks to see Dr. Johney Maine  - on going nausea post op  Mild renal insuffiencey - creatinine 0.68-1.46 - 1.49 LFT's are up - bilirubin is stable  Leukocytosis - 23K=>> 12.3 today - off antibiotics Poorly controlled insulin-dependent diabetes- A1C 7.6 Acute kidney injury  - resolved Diabetic retinopathy/perpherial neuropathy History of coronary artery disease/NSTEMI -status post stent11/2012- OK for surgery per Cardiology - restart ASA 81 after surgery - Echo =>> EF 60-65%;elevated ventricular end-diastolic filling pressure and elevated left atrial filling  pressure. Hyperlipidemia Hypertension-poorly controlled Mild Malnutrition  = prealbumin 15.9 on 09/26/17 Glaucoma -patient notes he was not able to pin the drain to his hospital gown secondary to his issues.  FEN: IV fluids/Clear diet=>> heart healthy, soft diet ID: rocephin 2/2 - day 3 WBC up =>>Zosyn 2/4 -patient needs the IV antibiotics for an additional 5 days through 09/26/17. DVT: Lovenox Foley: None Follow up:  DOW   Plan: I removed the Steri-Strips from the umbilical site. Discussed with Dr. Grandville Silos, and neither of [place] can explain ongoing nausea and not eating.  Will get a CT scan to be sure nothing else is going on, he still has an elevated WBC.  If the CT shows nothing, he may need a gastric emptying study or UGI to see how food is going thru.      LOS: 10 days    Zaki Gertsch 09/29/2017 [phone]

## 2017-09-29 NOTE — Care Management Important Message (Signed)
Important Message  Patient Details  Name: Derek Lowery MRN: [id number] Date of Birth: 24-Mar-1956   Medicare Important Message Given:  Yes    Kerin Salen 09/29/2017, 1:03 [place] Message  Patient Details  Name: Derek Lowery MRN: [id number] Date of Birth: Aug 16, 1956   Medicare Important Message Given:  Yes    Kerin Salen 09/29/2017, 1:03 PM

## 2017-09-30 DIAGNOSIS — R1114 Bilious vomiting: Secondary | ICD-10-CM

## 2017-09-30 DIAGNOSIS — N178 Other acute kidney failure: Secondary | ICD-10-CM

## 2017-09-30 LAB — CBC
HEMATOCRIT: 37.1 % — AB (ref 39.0–52.0)
HEMOGLOBIN: 12.8 g/dL — AB (ref 13.0–17.0)
MCH: 29 pg (ref 26.0–34.0)
MCHC: 34.5 g/dL (ref 30.0–36.0)
MCV: 83.9 fL (ref 78.0–100.0)
Platelets: 206 10*3/uL (ref 150–400)
RBC: 4.42 MIL/uL (ref 4.22–5.81)
RDW: 13.1 % (ref 11.5–15.5)
WBC: 12 10*3/uL — AB (ref 4.0–10.5)

## 2017-09-30 LAB — BASIC METABOLIC PANEL
ANION GAP: 11 (ref 5–15)
BUN: 17 mg/dL (ref 6–20)
CO2: 23 mmol/L (ref 22–32)
Calcium: 8.6 mg/dL — ABNORMAL LOW (ref 8.9–10.3)
Chloride: 99 mmol/L — ABNORMAL LOW (ref 101–111)
Creatinine, Ser: 0.84 mg/dL (ref 0.61–1.24)
GFR calc Af Amer: >60 mL/min (ref >60)
GFR calc non Af Amer: >60 mL/min (ref >60)
GLUCOSE: 98 mg/dL (ref 65–99)
POTASSIUM: 3.7 mmol/L (ref 3.5–5.1)
Sodium: 133 mmol/L — ABNORMAL LOW (ref 135–145)

## 2017-09-30 LAB — GLUCOSE, CAPILLARY
GLUCOSE-CAPILLARY: 136 mg/dL — AB (ref 65–99)
GLUCOSE-CAPILLARY: 143 mg/dL — AB (ref 65–99)
Glucose-Capillary: 86 mg/dL (ref 65–99)
Glucose-Capillary: 172 mg/dL — ABNORMAL HIGH (ref 65–99)

## 2017-09-30 LAB — MAGNESIUM: Magnesium: 2 mg/dL (ref 1.7–2.4)

## 2017-09-30 MED ORDER — METOCLOPRAMIDE HCL 5 MG/ML IJ SOLN
5.0000 mg | Freq: Three times a day (TID) | INTRAMUSCULAR | Status: DC
Start: 2017-09-30 — End: 2017-10-01
  Administered 2017-09-30 – 2017-10-01 (×2): 5 mg via INTRAVENOUS
  Filled 2017-09-30 (×2): qty 2

## 2017-09-30 NOTE — Progress Notes (Signed)
Occupational Therapy Treatment and DC Patient Details Name: Derek Lowery MRN: [id number] DOB: 01-30-1956 Today's Date: 09/30/2017    History of present illness 62 y.o. male with history of insulin-dependent diabetes, coronary artery disease, status post MI 2012 who presents to the emergency department with a 2-day history of epigastric and right upper quadrant abdominal pain.  This is associated with nausea and vomiting. Dx of acute gangrenous cholecystitis, s/p lap chole 09/21/17.   OT comments  OT goals met and pt DCed from OT  Follow Up Recommendations       Equipment Recommendations  Tub/shower bench    Recommendations for Other Services      Precautions / Restrictions Precautions Precautions: None       Mobility Bed Mobility Overal bed mobility: Modified Independent       Supine to sit: (OT did flatten out bed for pt)        Transfers Overall transfer level: Needs assistance               General transfer comment: cue for hand placement        ADL either performed or assessed with clinical judgement   ADL Overall ADL's : Independent                                       General ADL Comments: OT goals met     Vision Baseline Vision/History: Glaucoma     Perception     Praxis      Cognition Arousal/Alertness: Awake/alert Behavior During Therapy: WFL for tasks assessed/performed Overall Cognitive Status: Within Functional Limits for tasks assessed                                                General Comments      Pertinent Vitals/ Pain       Pain Score: 2  Pain Location: abdomen Pain Intervention(s): Monitored during session         Frequency           Progress Toward Goals  OT Goals(current goals can now be found in the care plan section)  Progress towards OT goals: Goals met/education completed, patient discharged from OT  Acute Rehab OT Goals Patient Stated Goal: get out of  here OT Goal Formulation: With patient Time For Goal Achievement: 10/06/17 Potential to Achieve Goals: Good  Plan All goals met and education completed, patient discharged from OT services       AM-PAC PT "6 Clicks" Daily Activity     Outcome Measure   Help from another person eating meals?: None Help from another person taking care of personal grooming?: None Help from another person toileting, which includes using toliet, bedpan, or urinal?: None Help from another person bathing (including washing, rinsing, drying)?: None Help from another person to put on and taking off regular upper body clothing?: None Help from another person to put on and taking off regular lower body clothing?: None 6 Click Score: 24    End of Session        Activity Tolerance Patient tolerated treatment well   Patient Left in chair   Nurse Communication Mobility status        Time: 7017-7939 OT Time Calculation (min): 8 min  Charges: OT General  Charges $OT Visit: 1 Visit OT Treatments $Self Care/Home Management : 8-22 mins  [place], [place]   Betsy Pries 09/30/2017, 1:30 PM

## 2017-09-30 NOTE — Progress Notes (Signed)
9 Days Post-Op    CC: Abdominal pain  Subjective: Patient reports he vomited again yesterday after lunch today okay with breakfast.  Does not sound like he ate any supper.  His port site remains unchanged.  JP drain is serosanguineous.  Abdominal wall ecchymosis is unchanged.  Objective: Vital signs in last 24 hours: Temp:  [97.8 F (36.6 C)-98.7 F (37.1 C)] 98.1 F (36.7 C) (02/13 0556) Pulse Rate:  [67-75] 73 (02/13 0556) Resp:  [16-18] 18 (02/13 0556) BP: (121-148)/(72-82) 132/74 (02/13 0556) SpO2:  [98 %-100 %] 99 % (02/13 0556) Weight:  [98.5 kg (217 lb 2.5 oz)] 98.5 kg (217 lb 2.5 oz) (02/13 0556) Last BM Date: 09/29/17 480 PO Urine x 2 Drain 30  Vomited x 1 Stool x 1 Afebrile, VSS BMP OK WCB up to 12K H/H stable CT scan 2/12: Interval cholecystectomy with a surgical drain in the gallbladder fossa. No focal fluid collection to suggest a biloma, hematoma or abscess. 2. Air-fluid levels and mild distension of the colon as can be seen with colonic ileus. 3. Lumbar spine spondylosis.  Intake/Output from previous day: 02/12 0701 - 02/13 0700 In: 480 [P.O.:480] Out: 30 [Drains:30] Intake/Output this shift: No intake/output data recorded.  General appearance: alert, cooperative and no distress Resp: clear to auscultation bilaterally GI: Soft positive bowel sounds, JP drain is serosanguineous, abdominal wall ecchymosis, positive bowel sounds, positive bowel movements.  Lab Results:  Recent Labs    09/29/17 0442 09/30/17 0429  WBC 11.6* 12.0*  HGB 12.4* 12.8*  HCT 36.1* 37.1*  PLT 207 206    BMET Recent Labs    09/29/17 0442 09/30/17 0429  NA 134* 133*  K 4.2 3.7  CL 102 99*  CO2 21* 23  GLUCOSE 97 98  BUN 13 17  CREATININE 0.73 0.84  CALCIUM 8.6* 8.6*   PT/INR No results for input(s): LABPROT, INR in the last 72 hours.  Recent Labs  Lab 09/24/17 0539 09/26/17 0003  AST 72* 152*  ALT 110* 153*  ALKPHOS 79 83  BILITOT 0.3 0.7  PROT 5.5*  5.7*  ALBUMIN 2.5* 2.5*     Lipase     Component Value Date/Time   LIPASE 20 09/22/2017 0516     Medications: . acetaminophen  1,000 mg Oral TID  . amLODipine  10 mg Oral Daily  . aspirin  81 mg Oral Daily  . atorvastatin  40 mg Oral q1800  . dorzolamide  1 drop Right Eye BID  . enoxaparin (LOVENOX) injection  40 mg Subcutaneous Q24H  . feeding supplement (GLUCERNA SHAKE)  237 mL Oral BID BM  . hydrochlorothiazide  25 mg Oral Daily  . insulin aspart  0-5 Units Subcutaneous QHS  . insulin aspart  0-9 Units Subcutaneous TID WC  . insulin glargine  12 Units Subcutaneous Daily  . lip balm  1 application Topical BID  . pantoprazole  40 mg Oral Q0600  . psyllium  1 packet Oral BID  . saccharomyces boulardii  250 mg Oral BID  . timolol  1 drop Right Eye BID    Anti-infectives (From admission, onward)   Start     Dose/Rate Route Frequency Ordered Stop   09/21/17 1500  clindamycin (CLEOCIN) 900 mg, gentamicin (GARAMYCIN) 240 mg in sodium chloride 0.9 % 1,000 mL for intraperitoneal lavage      Intraperitoneal To Surgery 09/21/17 1427 09/21/17 1455   09/21/17 1030  piperacillin-tazobactam (ZOSYN) IVPB 3.375 g  Status:  Discontinued     3.375  g 12.5 mL/hr over 240 Minutes Intravenous Every 8 hours 09/21/17 0930 09/27/17 1053   09/21/17 0000  amoxicillin-clavulanate (AUGMENTIN) 875-125 MG tablet     1 tablet Oral 2 times daily 09/21/17 1549     09/19/17 1200  cefTRIAXone (ROCEPHIN) 2 g in dextrose 5 % 50 mL IVPB  Status:  Discontinued     2 g 100 mL/hr over 30 Minutes Intravenous Every 24 hours 09/19/17 1120 09/21/17 0930      Assessment/Plan Acute gangrenous cholecystitis S/P laparoscopic cholecystectomy and drain placement 09/21/17, Dr. Alwyn Pea POD 8 -Drain his stay in for 2 weeks-over concern for possible cystic duct leak -Follow-up 1 week in the [place] clinic and 2 weeks to see Dr. Johney Maine Post op nausea and vomiting  - the CT 09/29/17: Interval cholecystectomy with a  surgical drain in the gallbladder fossa. No focal fluid collection to suggest a biloma, hematoma or Abscess. 2. Air-fluid levels and mild distension of the colon as can be seen with colonic ileus   Mild renal insuffiencey - creatinine 0.68-1.46 - 1.49 LFT's are up - bilirubin is stable  Leukocytosis - 23K=>> 12.3 today - off antibiotics Poorly controlled insulin-dependent diabetes- A1C 7.6 Acute kidney injury  - resolved Diabetic retinopathy/perpherial neuropathy History of coronary artery disease/NSTEMI -status post stent11/2012- OK for surgery per Cardiology - restart ASA 81 after surgery - Echo =>> EF 60-65%;elevated ventricular end-diastolic filling pressure and elevated left atrial filling pressure. Hyperlipidemia Hypertension-poorly controlled Malnutrition  = prealbumin 15.9 on 09/26/17  FEN: IV fluids/Clear diet=>> heart healthy, soft diet ID: rocephin 2/2 - day 3 WBC up =>>Zosyn 2/4 -patient needs the IV antibiotics for an additional 5 days through 09/26/17. DVT: Lovenox Foley: None Follow up DOW   Plan: He has ongoing episodes of nausea and emesis.  CT scan showed no surgical source of his issues.  They did mention possible colonic ileus.  Discussed with Dr. Grandville Silos yesterday.  If CT was negative they could pursue a gastroparesis/ileus source of his nausea and vomiting.  I will defer to Medicine for this evaluation.  He has postop discharge instructions and follow-up instructions in the AVS.     LOS: 11 days    Arush Gatliff 09/30/2017 [phone]

## 2017-09-30 NOTE — Progress Notes (Signed)
PROGRESS NOTE        PATIENT DETAILS Name: Derek Lowery Age: 62 y.o. Sex: male Date of Birth: Feb 04, 1956 Admit Date: 09/18/2017 Admitting Physician Md Edison Pace, MD [id number], Dalbert Batman, MD  Brief Narrative: Patient is a 62 y.o. male with history of insulin-dependent diabetes, CAD status post PCI in 2012-admitted with acute cholecystitis, underwent cholecystectomy on 2/4-further hospital course complicated by development of nausea and vomiting.  See below for further details  Subjective: Tolerated clear liquids-had BM earlier this morning.   Assessment/Plan: Acute gangrenous cholecystitis s/p lap cholecystectomy 09/21/2017: General surgery following and directing.  Care-JP drain currently in place.  All antimicrobial therapy has been discontinued.  Nausea with vomiting: Postop course complicated by nausea with vomiting-CT of the abdomen does not show any major abnormality-and he has a long-standing history of diabetes-we will try a short course of scheduled Reglan.  We will slowly advance diet.  Insulin-dependent diabetes: CBGs relatively stable-continue with Lantus 12 units and SSI.  Hypertension: Controlled with amlodipine and HCTZ.  Dyslipidemia: Continue statin  Diastolic heart failure: Compensated-follow volume status closely  Acute kidney injury: Likely hemodynamically mediated-renal function has normalized  Right foot ulcer on plantar aspect of first great toe: Stable-continue outpatient follow-up with primary care MD  DVT Prophylaxis: Prophylactic Lovenox   Code Status: Full code   Family Communication: None at bedside  Disposition Plan: Remain inpatient-  Antimicrobial agents: Anti-infectives (From admission, onward)   Start     Dose/Rate Route Frequency Ordered Stop   09/21/17 1500  clindamycin (CLEOCIN) 900 mg, gentamicin (GARAMYCIN) 240 mg in sodium chloride 0.9 % 1,000 mL for intraperitoneal lavage      Intraperitoneal To Surgery  09/21/17 1427 09/21/17 1455   09/21/17 1030  piperacillin-tazobactam (ZOSYN) IVPB 3.375 g  Status:  Discontinued     3.375 g 12.5 mL/hr over 240 Minutes Intravenous Every 8 hours 09/21/17 0930 09/27/17 1053   09/21/17 0000  amoxicillin-clavulanate (AUGMENTIN) 875-125 MG tablet     1 tablet Oral 2 times daily 09/21/17 1549     09/19/17 1200  cefTRIAXone (ROCEPHIN) 2 g in dextrose 5 % 50 mL IVPB  Status:  Discontinued     2 g 100 mL/hr over 30 Minutes Intravenous Every 24 hours 09/19/17 1120 09/21/17 0930      Procedures: None  CONSULTS:  general surgery  Cardiology  Time spent: 25- minutes-Greater than 50% of this time was spent in counseling, explanation of diagnosis, planning of further management, and coordination of care.  MEDICATIONS: Scheduled Meds: . acetaminophen  1,000 mg Oral TID  . amLODipine  10 mg Oral Daily  . aspirin  81 mg Oral Daily  . atorvastatin  40 mg Oral q1800  . dorzolamide  1 drop Right Eye BID  . enoxaparin (LOVENOX) injection  40 mg Subcutaneous Q24H  . feeding supplement (GLUCERNA SHAKE)  237 mL Oral BID BM  . hydrochlorothiazide  25 mg Oral Daily  . insulin aspart  0-5 Units Subcutaneous QHS  . insulin aspart  0-9 Units Subcutaneous TID WC  . insulin glargine  12 Units Subcutaneous Daily  . lip balm  1 application Topical BID  . metoCLOPramide (REGLAN) injection  5 mg Intravenous TID AC  . pantoprazole  40 mg Oral Q0600  . psyllium  1 packet Oral BID  . saccharomyces boulardii  250 mg Oral BID  .  timolol  1 drop Right Eye BID   Continuous Infusions: PRN Meds:.bisacodyl, fentaNYL (SUBLIMAZE) injection, gi cocktail, guaiFENesin-dextromethorphan, hydrALAZINE, hydrocortisone, hydrocortisone cream, magic mouthwash, menthol-cetylpyridinium, methocarbamol, metoprolol tartrate, [DISCONTINUED] ondansetron **OR** ondansetron (ZOFRAN) IV, phenol, prochlorperazine, traMADol   PHYSICAL EXAM: Vital signs: Vitals:   09/29/17 2216 09/30/17 0556 09/30/17  0945 09/30/17 1421  BP: 139/72 132/74 106/66 118/71  Pulse: 67 73 75 77  Resp: 17 18  18   Temp: 97.8 F (36.6 C) 98.1 F (36.7 C)  98 F (36.7 C)  TempSrc: Oral Oral  Oral  SpO2: 98% 99% 100% 100%  Weight:  98.5 kg (217 lb 2.5 oz)    Height:       Filed Weights   09/28/17 0456 09/29/17 0540 09/30/17 0556  Weight: 99 kg (218 lb 4.1 oz) 98 kg (216 lb 0.8 oz) 98.5 kg (217 lb 2.5 oz)   Body mass index is 31.16 kg/m.   General appearance :Awake, alert, not in any distress.  Eyes:, pupils equally reactive to light and accomodation,no scleral icterus.Pink conjunctiva HEENT: Atraumatic and Normocephalic Neck: supple, no JVD. No cervical lymphadenopathy. No thyromegaly Resp:Good air entry bilaterally, no added sounds  CVS: S1 S2 regular, no murmurs.  GI: Bowel sounds present, appropriately-mildly tender at the operative site Extremities: B/L Lower Ext shows no edema, both legs are warm to touch Neurology:  speech clear,Non focal, sensation is grossly intact. Psychiatric: Normal judgment and insight. Alert and oriented x 3. Normal mood. Musculoskeletal:No digital cyanosis Skin:No Rash, warm and dry Wounds:N/A  I have personally reviewed following labs and imaging studies  LABORATORY DATA: CBC: Recent Labs  Lab 09/24/17 0539  09/26/17 0003 09/27/17 0410 09/28/17 0417 09/29/17 0442 09/30/17 0429  WBC 8.2   < > 11.9* 12.1* 12.3* 11.6* 12.0*  NEUTROABS 5.9  --   --   --   --   --   --   HGB 11.7*   < > 12.2* 11.7* 12.2* 12.4* 12.8*  HCT 33.6*   < > 35.3* 34.2* 35.7* 36.1* 37.1*  MCV 83.8   < > 83.6 84.0 85.0 84.1 83.9  PLT 198   < > 224 190 200 207 206   < > = values in this interval not displayed.    Basic Metabolic Panel: Recent Labs  Lab 09/25/17 0537 09/26/17 0003 09/27/17 0410 09/28/17 0417 09/29/17 0442 09/30/17 0429  NA 137 135 134* 131* 134* 133*  K 3.3* 3.6 4.2 3.9 4.2 3.7  CL 107 104 103 101 102 99*  CO2 25 26 25  21* 21* 23  GLUCOSE 92 90 129* 102* 97  98  BUN 22* 18 20 16 13 17   CREATININE 0.90 0.92 0.88 0.81 0.73 0.84  CALCIUM 7.8* 7.9* 7.8* 8.1* 8.6* 8.6*  MG 2.2 1.9  --   --   --  2.0  PHOS 2.2*  --   --   --   --   --     GFR: Estimated Creatinine Clearance: 111.4 mL/min (by C-G formula based on SCr of 0.84 mg/dL).  Liver Function Tests: Recent Labs  Lab 09/24/17 0539 09/26/17 0003  AST 72* 152*  ALT 110* 153*  ALKPHOS 79 83  BILITOT 0.3 0.7  PROT 5.5* 5.7*  ALBUMIN 2.5* 2.5*   No results for input(s): LIPASE, AMYLASE in the last 168 hours. No results for input(s): AMMONIA in the last 168 hours.  Coagulation Profile: No results for input(s): INR, PROTIME in the last 168 hours.  Cardiac Enzymes: No results for  input(s): CKTOTAL, CKMB, CKMBINDEX, TROPONINI in the last 168 hours.  BNP (last 3 results) No results for input(s): PROBNP in the last 8760 hours.  HbA1C: No results for input(s): HGBA1C in the last 72 hours.  CBG: Recent Labs  Lab 09/29/17 1717 09/29/17 2215 09/30/17 0755 09/30/17 1149 09/30/17 1624  GLUCAP 94 81 86 136* 143*    Lipid Profile: No results for input(s): CHOL, HDL, LDLCALC, TRIG, CHOLHDL, LDLDIRECT in the last 72 hours.  Thyroid Function Tests: No results for input(s): TSH, T4TOTAL, FREET4, T3FREE, THYROIDAB in the last 72 hours.  Anemia Panel: No results for input(s): VITAMINB12, FOLATE, FERRITIN, TIBC, IRON, RETICCTPCT in the last 72 hours.  Urine analysis:    Component Value Date/Time   COLORURINE AMBER (A) 09/21/2017 1215   APPEARANCEUR CLEAR 09/21/2017 1215   LABSPEC 1.020 09/21/2017 1215   PHURINE 5.0 09/21/2017 1215   GLUCOSEU >=500 (A) 09/21/2017 1215   HGBUR NEGATIVE 09/21/2017 1215   BILIRUBINUR NEGATIVE 09/21/2017 1215   BILIRUBINUR neg 12/13/2014 1447   KETONESUR 5 (A) 09/21/2017 1215   PROTEINUR 100 (A) 09/21/2017 1215   UROBILINOGEN 1.0 12/13/2014 1447   UROBILINOGEN 1.0 05/05/2014 0630   NITRITE NEGATIVE 09/21/2017 1215   LEUKOCYTESUR NEGATIVE  09/21/2017 1215    Sepsis Labs: Lactic Acid, Venous    Component Value Date/Time   LATICACIDVEN 0.96 09/19/2017 0545    MICROBIOLOGY: No results found for this or any previous visit (from the past 240 hour(s)).  RADIOLOGY STUDIES/RESULTS: Ct Abdomen Pelvis W Contrast  Result Date: 09/29/2017 CLINICAL DATA:  Status post cholecystectomy for acute gangrenous cholecystitis. Laparoscopic cholecystectomy performed 09/21/2017. Constant nausea and vomiting. EXAM: CT ABDOMEN AND PELVIS WITH CONTRAST TECHNIQUE: Multidetector CT imaging of the abdomen and pelvis was performed using the standard protocol following bolus administration of intravenous contrast. CONTRAST:  181m ISOVUE-300 IOPAMIDOL (ISOVUE-300) INJECTION 61% COMPARISON:  CT abdomen 09/19/2017 FINDINGS: Lower chest: No acute abnormality. Hepatobiliary: Normal liver. No focal hepatic mass. Interval cholecystectomy. Surgical drain in the gallbladder fossa. Small amount of fluid adjacent to the surgical clips at the cholecystectomy site likely reflecting postsurgical changes. No focal fluid collection to suggest a biloma, hematoma or abscess. No intrahepatic or extrahepatic biliary duct dilatation. Pancreas: Unremarkable. No pancreatic ductal dilatation or surrounding inflammatory changes. Spleen: Normal in size without focal abnormality. Adrenals/Urinary Tract: Adrenal glands are unremarkable. Kidneys are normal, without renal calculi, focal lesion, or hydronephrosis. Bladder is unremarkable. Stomach/Bowel: Stomach is distended with an air-fluid level. Air-fluid levels throughout the colon. No bowel dilatation. No pneumatosis, pneumoperitoneum or portal venous gas. No abdominal or pelvic free fluid. Vascular/Lymphatic: Normal caliber abdominal aorta. No lymphadenopathy. Reproductive: Prostate is unremarkable. Other: No abdominal wall hernia. Multiple areas of fatty infiltration in the subcutaneous fat with overlying skin thickening within the  anterior abdominal wall likely reflecting laparoscopy ports. Musculoskeletal: No acute osseous abnormality. No aggressive osseous lesion. Calcified broad-based disc bulges at L3-4 and L4-5 with bilateral facet arthropathy. Spinal stenosis at L3-4 and L4-5. Broad-based disc osteophyte complex at L5-S1 with mild bilateral facet arthropathy. IMPRESSION: 1. Interval cholecystectomy with a surgical drain in the gallbladder fossa. No focal fluid collection to suggest a biloma, hematoma or abscess. 2. Air-fluid levels and mild distension of the colon as can be seen with colonic ileus. 3. Lumbar spine spondylosis. Electronically Signed   By: HKathreen Devoid  On: 09/29/2017 16:23   Ct Abdomen Pelvis W Contrast  Result Date: 09/19/2017 CLINICAL DATA:  Abdominal distention. Nausea and vomiting. No bowel movement in  2 days. EXAM: CT ABDOMEN AND PELVIS WITH CONTRAST TECHNIQUE: Multidetector CT imaging of the abdomen and pelvis was performed using the standard protocol following bolus administration of intravenous contrast. CONTRAST:  149m ISOVUE-300 IOPAMIDOL (ISOVUE-300) INJECTION 61% COMPARISON:  Ultrasound right upper quadrant 09/19/2017. Abdominal series 09/19/2017. CT pelvis 05/05/2014 FINDINGS: Lower chest: Mild dependent changes in the lung bases. Hepatobiliary: Gallbladder is distended. No wall thickening. No focal stones identified. No bile duct dilatation. No focal liver lesions. Pancreas: Unremarkable. No pancreatic ductal dilatation or surrounding inflammatory changes. Spleen: Normal in size without focal abnormality. Adrenals/Urinary Tract: Adrenal glands are unremarkable. Kidneys are normal, without renal calculi, focal lesion, or hydronephrosis. Bladder is unremarkable. Stomach/Bowel: Stomach, small bowel, and colon are mostly decompressed. Scattered stool within the colon. No wall thickening or inflammatory changes identified. Appendix is normal. Vascular/Lymphatic: Aortic atherosclerosis. No enlarged abdominal  or pelvic lymph nodes. Reproductive: Prostate is unremarkable. Calcification of the vas deferens. Other: No free air or free fluid in the abdomen. Abdominal wall musculature appears intact. No mesenteric edema. Musculoskeletal: Mild degenerative changes in the spine and hips. No destructive bone lesions. IMPRESSION: 1. Distended gallbladder without wall thickening, stone, or infiltration. 2. Aortic atherosclerosis. 3. No bowel obstruction or inflammation. Electronically Signed   By: WLucienne CapersM.D.   On: 09/19/2017 06:19   [place]oreaRenal  Result Date: 09/21/2017 CLINICAL DATA:  Acute renal failure EXAM: RENAL / URINARY TRACT ULTRASOUND COMPLETE COMPARISON:  CT abdomen and pelvis September 19, 2017 FINDINGS: Right Kidney: Length: 11.4 cm. Echogenicity and renal cortical thickness are within normal limits. No mass, perinephric fluid, or hydronephrosis visualized. No sonographically demonstrable calculus or ureterectasis. Left Kidney: Length: 11.4 cm. Echogenicity and renal cortical thickness are within normal limits. No mass, perinephric fluid, or hydronephrosis visualized. No sonographically demonstrable calculus or ureterectasis. Bladder: Appears normal for degree of bladder distention. IMPRESSION: Study within normal limits. Electronically Signed   By: WLowella GripIII M.D.   On: 09/21/2017 19:22   [place]oreaAbdomen Limited  Result Date: 09/19/2017 CLINICAL DATA:  Right upper quadrant pain, epigastric pain, and vomiting. EXAM: ULTRASOUND ABDOMEN LIMITED RIGHT UPPER QUADRANT COMPARISON:  None. FINDINGS: Gallbladder: Gallbladder is distended and filled with echogenic material consistent with sludge and/or small stones. No gallbladder wall thickening. Murphy's sign is negative. Common bile duct: Diameter: 4 mm, normal Liver: No focal lesion identified. Within normal limits in parenchymal echogenicity. Portal vein is patent on color Doppler imaging with normal direction of blood flow towards the liver. IMPRESSION:  Distended gallbladder filled with echogenic material consistent with sludge or small stones. No additional changes to suggest cholecystitis. Electronically Signed   By: WLucienne CapersM.D.   On: 09/19/2017 05:15   Dg Chest Port 1 View  Result Date: 09/21/2017 CLINICAL DATA:  Shortness of Breath EXAM: PORTABLE CHEST 1 VIEW COMPARISON:  09/19/2017 FINDINGS: Cardiac shadow is accentuated by the portable technique. Lungs are hypoinflated with mild bibasilar atelectatic change. No bony abnormality is noted. IMPRESSION: Poor inspiratory effort with bibasilar atelectatic change. Electronically Signed   By: MInez CatalinaM.D.   On: 09/21/2017 07:56   Dg Abd 2 Views  Result Date: 09/26/2017 CLINICAL DATA:  Abdominal distention EXAM: ABDOMEN - 2 VIEW COMPARISON:  CT 09/19/2017 FINDINGS: Prior cholecystectomy. Nonobstructive bowel gas pattern with gas throughout nondistended large and small bowel. No free air organomegaly. IMPRESSION: Changes of cholecystectomy.  No obstruction or free air. Electronically Signed   By: KRolm BaptiseM.D.   On: 09/26/2017 12:02   Dg Abdomen  Acute W/chest  Result Date: 09/19/2017 CLINICAL DATA:  Upper abdominal pain and nausea. No bowel movement over 2 days. EXAM: DG ABDOMEN ACUTE W/ 1V CHEST COMPARISON:  Chest 05/05/2014 FINDINGS: Normal heart size and pulmonary vascularity. No focal airspace disease or consolidation in the lungs. No blunting of costophrenic angles. No pneumothorax. Mediastinal contours appear intact. Gas and stool throughout the colon. Hepatic flexure of the colon is focally no small or large bowel distention. No free intra-abdominal air. No abnormal air-fluid levels. No radiopaque stones. Calcification in the vas deferens. Displaced suggesting an enlarged gallbladder. IMPRESSION: No evidence of active pulmonary disease. Right upper quadrant soft tissues suggest an enlarged gallbladder. Nonobstructive bowel gas pattern. Electronically Signed   By: Lucienne Capers M.D.    On: 09/19/2017 03:40     LOS: 11 days   Oren Binet, MD  [facility] Pager:[phone]  If 7PM-7AM, please contact night-coverage [email or web address] Password [facility] 09/30/2017, 4:26 PM

## 2017-10-01 DIAGNOSIS — E118 Type 2 diabetes mellitus with unspecified complications: Secondary | ICD-10-CM

## 2017-10-01 DIAGNOSIS — E1165 Type 2 diabetes mellitus with hyperglycemia: Secondary | ICD-10-CM

## 2017-10-01 LAB — BASIC METABOLIC PANEL
ANION GAP: 10 (ref 5–15)
BUN: 24 mg/dL — ABNORMAL HIGH (ref 6–20)
CO2: 23 mmol/L (ref 22–32)
Calcium: 8.3 mg/dL — ABNORMAL LOW (ref 8.9–10.3)
Chloride: 100 mmol/L — ABNORMAL LOW (ref 101–111)
Creatinine, Ser: 0.99 mg/dL (ref 0.61–1.24)
GLUCOSE: 135 mg/dL — AB (ref 65–99)
POTASSIUM: 3.5 mmol/L (ref 3.5–5.1)
Sodium: 133 mmol/L — ABNORMAL LOW (ref 135–145)

## 2017-10-01 LAB — GLUCOSE, CAPILLARY: Glucose-Capillary: 143 mg/dL — ABNORMAL HIGH (ref 65–99)

## 2017-10-01 MED ORDER — AMLODIPINE BESYLATE 10 MG PO TABS: 10.0000 mg | ORAL_TABLET | Freq: Every day | ORAL | 0 refills | Status: DC

## 2017-10-01 MED ORDER — METOCLOPRAMIDE HCL 10 MG PO TABS: 10.0000 mg | ORAL_TABLET | Freq: Four times a day (QID) | ORAL | 0 refills | Status: DC | PRN

## 2017-10-01 MED ORDER — TRAMADOL HCL 50 MG PO TABS: 50.0000 mg | ORAL_TABLET | Freq: Four times a day (QID) | ORAL | 0 refills | Status: DC | PRN

## 2017-10-01 MED ORDER — INSULIN GLARGINE 100 UNIT/ML SOLOSTAR PEN: 15.0000 [IU] | PEN_INJECTOR | Freq: Every day | SUBCUTANEOUS | 0 refills | Status: DC

## 2017-10-01 MED ORDER — GLUCERNA SHAKE PO LIQD: 237.0000 mL | Freq: Two times a day (BID) | ORAL | 0 refills | Status: DC

## 2017-10-01 NOTE — Progress Notes (Signed)
Feels much better-no further nausea vomiting. Tolerating diet Talked with general surgery-okay to discharge from the point of view. Stable for discharge today-please see discharge summary for further details

## 2017-10-01 NOTE — Discharge Summary (Addendum)
PATIENT DETAILS Name: Derek Lowery Age: 62 y.o. Sex: male Date of Birth: [birth year] MRN: [id number]. Admitting Physician: Nolon Nations, MD [id number], Dalbert Batman, MD  Admit Date: 09/18/2017 Discharge date: 10/01/2017  Recommendations for Outpatient Follow-up:  1. Follow up with PCP in 1-2 weeks 2. Please obtain BMP/CBC in one week 3. Please ensure follow-up with general surgery  Admitted From:  Home  Disposition: Home with home health [place]: Yes  Equipment/Devices: JP drain by general surgery-to be removed in the outpatient setting  Discharge Condition: Stable  CODE STATUS: FULL CODE  Diet recommendation:  Heart Healthy / Carb Modified   Brief Summary: See H&P, Labs, Consult and Test reports for all details in brief, Patient is a 62 y.o. male with history of insulin-dependent diabetes, CAD status post PCI in 2012-admitted with acute cholecystitis, underwent cholecystectomy on 2/4-further hospital course complicated by development of nausea and vomiting.  See below for further details  Brief Hospital Course: Acute gangrenous cholecystitis s/p lap cholecystectomy 09/21/2017: Postoperative course complicated by nausea and vomiting which is now resolved.  General surgery follow closely during this hospital stay.  Recommendations are to discharge with JP drain-patient has follow-up with general surgery in the outpatient setting.  All antimicrobial therapy has now been discontinued.   Nausea with vomiting: Postop course complicated by nausea with vomiting-CT of the abdomen does not show any major abnormality-and he has a long-standing history of diabetes-may have had a flare of undiagnosed gastroparesis due to narcotics etc. tarted empirically on Reglan with complete resolution of nausea and vomiting-he is tolerating a regular diet.  Stable for discharge-continue Reglan but change it to as needed.  If recurrent nausea vomiting continue to occur-may be worthwhile  getting gastroenterology opinion in the outpatient setting..  Insulin-dependent diabetes:  CBG stable with 12 units of Lantus-he normally takes around 30 units of Lantus daily-with his diet being now stable I suspect he is going to require more insulin.  For now I will discharge him on 15 units of Lantus and NovoLog-we have instructed him to check his CBGs and slowly go back to his usual dosing as his diet stabilizes.   Hypertension:  Controlled-resume HCTZ and lisinopril on discharge.  Lisinopril was briefly discontinued due to acute kidney injury that was likely hemodynamically mediated.  Should be okay to resume on discharge.Marland Kitchen  Dyslipidemia: Continue statin  Diastolic heart failure: Compensated-follow volume status closely  Acute kidney injury: Likely hemodynamically mediated-renal function has normalized  Right foot ulcer on plantar aspect of first great toe: Stable-continue outpatient follow-up with primary care MD  Procedures/Studies: 2/4>> laparoscopic cholecystectomy  Discharge Diagnoses:  Principal Problem:   Acute gangrenous cholecystitis s/p lap cholecystectomy 09/21/2017 Active Problems:   CAD in native artery   Essential hypertension   Diabetic polyneuropathy associated with type 2 diabetes mellitus ([place])   Retinopathy due to secondary diabetes ([place])   ARF (acute renal failure) ([place])   Discharge Instructions:  Activity:  As tolerated with Full fall precautions use walker/cane & assistance as needed   Discharge Instructions    Call MD for:   Complete by:  As directed    FEVER > 101.5 F  (temperatures < 101.5 F are not significant)   Call MD for:  extreme fatigue   Complete by:  As directed    Call MD for:  persistant dizziness or light-headedness   Complete by:  As directed    Call MD for:  persistant nausea and vomiting   Complete  by:  As directed    Call MD for:  redness, tenderness, or signs of infection (pain, swelling, redness, odor or green/yellow  discharge around incision site)   Complete by:  As directed    Call MD for:  severe uncontrolled pain   Complete by:  As directed    Diet - low sodium heart healthy   Complete by:  As directed    Follow a light diet the first few days at home.   Start with a bland diet such as soups, liquids, starchy foods, low fat foods, etc.   If you feel full, bloated, or constipated, stay on a full liquid or pureed/blenderized diet for a few days until you feel better and no longer constipated. Be sure to drink plenty of fluids every day to avoid getting dehydrated (feeling dizzy, not urinating, etc.). Gradually add a fiber supplement to your diet   Diet - low sodium heart healthy   Complete by:  As directed    Diet Carb Modified   Complete by:  As directed    Discharge instructions   Complete by:  As directed    See Discharge Instructions If you are not getting better after two weeks or are noticing you are getting worse, contact our office [phone] for further advice.  We may need to adjust your medications, re-evaluate you in the office, send you to the emergency room, or see what other things we can do to help. The clinic staff is available to answer your questions during regular business hours (8:30am-5pm).  Please don't hesitate to call and ask to speak to one of our nurses for clinical concerns.    A surgeon from [facility] is always on call at the hospitals 24 hours/day If you have a medical emergency, go to the nearest emergency room or call 911.   Discharge instructions   Complete by:  As directed    Follow with Primary MD  Ladell Pier, MD in 1 week, and with general surgery as instructed by them.   Please get a complete blood count and chemistry panel checked by your Primary MD at your next visit, and again as instructed by your Primary MD.  Get Medicines reviewed and adjusted: Please take all your medications with you for your next visit with your Primary  MD  Laboratory/radiological data: Please request your Primary MD to go over all hospital tests and procedure/radiological results at the follow up, please ask your Primary MD to get all Hospital records sent to his/her office.  In some cases, they will be blood work, cultures and biopsy results pending at the time of your discharge. Please request that your primary care M.D. follows up on these results.  Also Note the following: If you experience worsening of your admission symptoms, develop shortness of breath, life threatening emergency, suicidal or homicidal thoughts you must seek medical attention immediately by calling 911 or calling your MD immediately  if symptoms less severe.  You must read complete instructions/literature along with all the possible adverse reactions/side effects for all the Medicines you take and that have been prescribed to you. Take any new Medicines after you have completely understood and accpet all the possible adverse reactions/side effects.   Do not drive when taking Pain medications or sleeping medications (Benzodaizepines)  Do not take more than prescribed Pain, Sleep and Anxiety Medications. It is not advisable to combine anxiety,sleep and pain medications without talking with your primary care practitioner  Special Instructions:  If you have smoked or chewed Tobacco  in the last 2 yrs please stop smoking, stop any regular Alcohol  and or any Recreational drug use.  Wear Seat belts while driving.  Please note: You were cared for by a hospitalist during your hospital stay. Once you are discharged, your primary care physician will handle any further medical issues. Please note that NO REFILLS for any discharge medications will be authorized once you are discharged, as it is imperative that you return to your primary care physician (or establish a relationship with a primary care physician if you do not have one) for your post hospital discharge needs so that  they can reassess your need for medications and monitor your lab values.   Driving Restrictions   Complete by:  As directed    You may drive when you are no longer taking narcotic prescription pain medication, you can comfortably wear a seatbelt, and you can safely make sudden turns/stops to protect yourself without hesitating due to pain.   For home use only DME Tub bench   Complete by:  As directed    Increase activity slowly   Complete by:  As directed    Start light daily activities --- self-care, walking, climbing stairs- beginning the day after surgery.  Gradually increase activities as tolerated.  Control your pain to be active.  Stop when you are tired.  Ideally, walk several times a day, eventually an hour a day.   Most people are back to most day-to-day activities in a few weeks.  It takes 4-8 weeks to get back to unrestricted, intense activity. If you can walk 30 minutes without difficulty, it is safe to try more intense activity such as jogging, treadmill, bicycling, low-impact aerobics, swimming, etc. Save the most intensive and strenuous activity for last (Usually 4-8 weeks after surgery) such as sit-ups, heavy lifting, contact sports, etc.  Refrain from any intense heavy lifting or straining until you are off narcotics for pain control.  You will have off days, but things should improve week-by-week. DO NOT PUSH THROUGH PAIN.  Let pain be your guide: If it hurts to do something, don't do it.  Pain is your body warning you to avoid that activity for another week until the pain goes down.   Increase activity slowly   Complete by:  As directed    Lifting restrictions   Complete by:  As directed    If you can walk 30 minutes without difficulty, it is safe to try more intense activity such as jogging, treadmill, bicycling, low-impact aerobics, swimming, etc. Save the most intensive and strenuous activity for last (Usually 4-8 weeks after surgery) such as sit-ups, heavy lifting, contact  sports, etc.  Refrain from any intense heavy lifting or straining until you are off narcotics for pain control.  You will have off days, but things should improve week-by-week. DO NOT PUSH THROUGH PAIN.  Let pain be your guide: If it hurts to do something, don't do it.  Pain is your body warning you to avoid that activity for another week until the pain goes down.   May walk up steps   Complete by:  As directed    No wound care   Complete by:  As directed    It is good for closed incision and even open wounds to be washed every day.  Shower every day.  Short baths are fine.  Wash the incisions and wounds clean with soap & water.    If  you have a closed incision(s), wash the incision with soap & water every day.  You may leave closed incisions open to air if it is dry.   You may cover the incision with clean gauze & replace it after your daily shower for comfort. If you have skin tapes (Steristrips) or skin glue (Dermabond) on your incision, leave them in place.  They will fall off on their own like a scab.  You may trim any edges that curl up with clean scissors.  If you have staples, set up an appointment for them to be removed in the office in 10 days after surgery.  If you have a drain, wash around the skin exit site with soap & water and place a new dressing of gauze or band aid around the skin every day.  Keep the drain site clean & dry.   Sexual Activity Restrictions   Complete by:  As directed    You may have sexual intercourse when it is comfortable. If it hurts to do something, stop.     Allergies as of 10/01/2017   No Known Allergies     Medication List    TAKE these medications   aspirin EC 81 MG tablet Take 1 tablet (81 mg total) by mouth daily.   atorvastatin 40 MG tablet Commonly known as:  LIPITOR TAKE 1 TABLET BY MOUTH EVERY DAY FOR CHOLESTEROL   dorzolamide 2 % ophthalmic solution Commonly known as:  TRUSOPT Place 1 drop into the right eye 2 (two) times daily.    feeding supplement (GLUCERNA SHAKE) Liqd Take 237 mLs by mouth 2 (two) times daily between meals.   glucose blood test strip Commonly known as:  ACCU-CHEK AVIVA PLUS Use as instructed for 3 times daily testing of blood sugar. E11.9   glucose monitoring kit monitoring kit 1 each by Does not apply route as needed for other.   Insulin Glargine 100 UNIT/ML Solostar Pen Commonly known as:  LANTUS SOLOSTAR Inject 15 Units into the skin daily. What changed:  how much to take   insulin lispro 100 UNIT/ML KiwkPen Commonly known as:  HUMALOG KWIKPEN Inject 0.03 mLs (3 Units total) into the skin 2 (two) times daily with a meal. Breakfast and supper, and pen needles 3/day   Insulin Pen Needle 32G X 4 MM Misc Commonly known as:  BD PEN NEEDLE NANO U/F Use as directed   lisinopril-hydrochlorothiazide 10-12.5 MG tablet Commonly known as:  PRINZIDE,ZESTORETIC Take 1 tablet by mouth daily.   metoCLOPramide 10 MG tablet Commonly known as:  REGLAN Take 1 tablet (10 mg total) by mouth every 6 (six) hours as needed for nausea.   multivitamin with minerals tablet Take 1 tablet by mouth daily.   timolol 0.5 % ophthalmic solution Commonly known as:  BETIMOL Place 1 drop into the right eye 2 (two) times daily.   traMADol 50 MG tablet Commonly known as:  ULTRAM Take 1 tablet (50 mg total) by mouth every 6 (six) hours as needed for moderate pain or severe pain.            Durable Medical Equipment  (From admission, onward)        Start     Ordered   10/01/17 0000  For home use only DME Tub bench     10/01/17 0935   09/27/17 1602  For home use only DME Tub bench  Once     09/27/17 1601     Follow-up Information    [facility]  [place] Follow up on 10/06/2017.   Specialty:  General Surgery Why:  your appointment is at 8:45 AM.  Be at the office 30 minutes early for check in.  Bring photo ID and insurance information.   Contact information: [address] [place] [address] [phone]        [facility] Follow [facility].   Specialty:  [place] Why:  nurse to assist with drain care and teaching Contact information: [address] [place] [address] [phone]        Ladell Pier, MD. Schedule an appointment as soon as possible for a visit in 1 week(s).   Specialty:  Internal Medicine Contact information: [place] [address] [phone]        Skeet Latch, MD .   Specialty:  Cardiology Contact information: [address] [place] [address] [phone]          No Known Allergies  Consultations: general surgery  Cardiology   Other Procedures/Studies: Ct Abdomen Pelvis W Contrast  Result Date: 09/29/2017 CLINICAL DATA:  Status post cholecystectomy for acute gangrenous cholecystitis. Laparoscopic cholecystectomy performed 09/21/2017. Constant nausea and vomiting. EXAM: CT ABDOMEN AND PELVIS WITH CONTRAST TECHNIQUE: Multidetector CT imaging of the abdomen and pelvis was performed using the standard protocol following bolus administration of intravenous contrast. CONTRAST:  174m ISOVUE-300 IOPAMIDOL (ISOVUE-300) INJECTION 61% COMPARISON:  CT abdomen 09/19/2017 FINDINGS: Lower chest: No acute abnormality. Hepatobiliary: Normal liver. No focal hepatic mass. Interval cholecystectomy. Surgical drain in the gallbladder fossa. Small amount of fluid adjacent to the surgical clips at the cholecystectomy site likely reflecting postsurgical changes. No focal fluid collection to suggest a biloma, hematoma or abscess. No intrahepatic or extrahepatic biliary duct dilatation. Pancreas: Unremarkable. No pancreatic ductal dilatation or surrounding inflammatory changes. Spleen: Normal in size without focal abnormality. Adrenals/Urinary Tract: Adrenal glands are unremarkable. Kidneys are normal, without renal calculi, focal lesion, or hydronephrosis.  Bladder is unremarkable. Stomach/Bowel: Stomach is distended with an air-fluid level. Air-fluid levels throughout the colon. No bowel dilatation. No pneumatosis, pneumoperitoneum or portal venous gas. No abdominal or pelvic free fluid. Vascular/Lymphatic: Normal caliber abdominal aorta. No lymphadenopathy. Reproductive: Prostate is unremarkable. Other: No abdominal wall hernia. Multiple areas of fatty infiltration in the subcutaneous fat with overlying skin thickening within the anterior abdominal wall likely reflecting laparoscopy ports. Musculoskeletal: No acute osseous abnormality. No aggressive osseous lesion. Calcified broad-based disc bulges at L3-4 and L4-5 with bilateral facet arthropathy. Spinal stenosis at L3-4 and L4-5. Broad-based disc osteophyte complex at L5-S1 with mild bilateral facet arthropathy. IMPRESSION: 1. Interval cholecystectomy with a surgical drain in the gallbladder fossa. No focal fluid collection to suggest a biloma, hematoma or abscess. 2. Air-fluid levels and mild distension of the colon as can be seen with colonic ileus. 3. Lumbar spine spondylosis. Electronically Signed   By: HKathreen Devoid  On: 09/29/2017 16:23   Ct Abdomen Pelvis W Contrast  Result Date: 09/19/2017 CLINICAL DATA:  Abdominal distention. Nausea and vomiting. No bowel movement in 2 days. EXAM: CT ABDOMEN AND PELVIS WITH CONTRAST TECHNIQUE: Multidetector CT imaging of the abdomen and pelvis was performed using the standard protocol following bolus administration of intravenous contrast. CONTRAST:  1047mISOVUE-300 IOPAMIDOL (ISOVUE-300) INJECTION 61% COMPARISON:  Ultrasound right upper quadrant 09/19/2017. Abdominal series 09/19/2017. CT pelvis 05/05/2014 FINDINGS: Lower chest: Mild dependent changes in the lung bases. Hepatobiliary: Gallbladder is distended. No wall thickening. No focal stones identified. No bile  duct dilatation. No focal liver lesions. Pancreas: Unremarkable. No pancreatic ductal dilatation or  surrounding inflammatory changes. Spleen: Normal in size without focal abnormality. Adrenals/Urinary Tract: Adrenal glands are unremarkable. Kidneys are normal, without renal calculi, focal lesion, or hydronephrosis. Bladder is unremarkable. Stomach/Bowel: Stomach, small bowel, and colon are mostly decompressed. Scattered stool within the colon. No wall thickening or inflammatory changes identified. Appendix is normal. Vascular/Lymphatic: Aortic atherosclerosis. No enlarged abdominal or pelvic lymph nodes. Reproductive: Prostate is unremarkable. Calcification of the vas deferens. Other: No free air or free fluid in the abdomen. Abdominal wall musculature appears intact. No mesenteric edema. Musculoskeletal: Mild degenerative changes in the spine and hips. No destructive bone lesions. IMPRESSION: 1. Distended gallbladder without wall thickening, stone, or infiltration. 2. Aortic atherosclerosis. 3. No bowel obstruction or inflammation. Electronically Signed   By: Lucienne Capers M.D.   On: 09/19/2017 06:19   US Renal  Result Date: 09/21/2017 CLINICAL DATA:  Acute renal failure EXAM: RENAL / URINARY TRACT ULTRASOUND COMPLETE COMPARISON:  CT abdomen and pelvis September 19, 2017 FINDINGS: Right Kidney: Length: 11.4 cm. Echogenicity and renal cortical thickness are within normal limits. No mass, perinephric fluid, or hydronephrosis visualized. No sonographically demonstrable calculus or ureterectasis. Left Kidney: Length: 11.4 cm. Echogenicity and renal cortical thickness are within normal limits. No mass, perinephric fluid, or hydronephrosis visualized. No sonographically demonstrable calculus or ureterectasis. Bladder: Appears normal for degree of bladder distention. IMPRESSION: Study within normal limits. Electronically Signed   By: Lowella Grip III M.D.   On: 09/21/2017 19:22   US Abdomen Limited  Result Date: 09/19/2017 CLINICAL DATA:  Right upper quadrant pain, epigastric pain, and vomiting. EXAM:  ULTRASOUND ABDOMEN LIMITED RIGHT UPPER QUADRANT COMPARISON:  None. FINDINGS: Gallbladder: Gallbladder is distended and filled with echogenic material consistent with sludge and/or small stones. No gallbladder wall thickening. Murphy's sign is negative. Common bile duct: Diameter: 4 mm, normal Liver: No focal lesion identified. Within normal limits in parenchymal echogenicity. Portal vein is patent on color Doppler imaging with normal direction of blood flow towards the liver. IMPRESSION: Distended gallbladder filled with echogenic material consistent with sludge or small stones. No additional changes to suggest cholecystitis. Electronically Signed   By: Lucienne Capers M.D.   On: 09/19/2017 05:15   Dg Chest Port 1 View  Result Date: 09/21/2017 CLINICAL DATA:  Shortness of Breath EXAM: PORTABLE CHEST 1 VIEW COMPARISON:  09/19/2017 FINDINGS: Cardiac shadow is accentuated by the portable technique. Lungs are hypoinflated with mild bibasilar atelectatic change. No bony abnormality is noted. IMPRESSION: Poor inspiratory effort with bibasilar atelectatic change. Electronically Signed   By: Inez Catalina M.D.   On: 09/21/2017 07:56   Dg Abd 2 Views  Result Date: 09/26/2017 CLINICAL DATA:  Abdominal distention EXAM: ABDOMEN - 2 VIEW COMPARISON:  CT 09/19/2017 FINDINGS: Prior cholecystectomy. Nonobstructive bowel gas pattern with gas throughout nondistended large and small bowel. No free air organomegaly. IMPRESSION: Changes of cholecystectomy.  No obstruction or free air. Electronically Signed   By: Rolm Baptise M.D.   On: 09/26/2017 12:02   Dg Abdomen Acute W/chest  Result Date: 09/19/2017 CLINICAL DATA:  Upper abdominal pain and nausea. No bowel movement over 2 days. EXAM: DG ABDOMEN ACUTE W/ 1V CHEST COMPARISON:  Chest 05/05/2014 FINDINGS: Normal heart size and pulmonary vascularity. No focal airspace disease or consolidation in the lungs. No blunting of costophrenic angles. No pneumothorax. Mediastinal contours  appear intact. Gas and stool throughout the colon. Hepatic flexure of the colon is focally no small  or large bowel distention. No free intra-abdominal air. No abnormal air-fluid levels. No radiopaque stones. Calcification in the vas deferens. Displaced suggesting an enlarged gallbladder. IMPRESSION: No evidence of active pulmonary disease. Right upper quadrant soft tissues suggest an enlarged gallbladder. Nonobstructive bowel gas pattern. Electronically Signed   By: Lucienne Capers M.D.   On: 09/19/2017 03:40     TODAY-DAY OF DISCHARGE:  Subjective:   Derek Lowery today has no headache,no chest abdominal pain,no new weakness tingling or numbness, feels much better wants to go home today.   Objective:   Blood pressure 133/75, pulse 66, temperature 98.6 F (37 C), temperature source Oral, resp. rate 18, height 5' 10"  (1.778 m), weight 96.6 kg (212 lb 15.4 oz), SpO2 99 %.  Intake/Output Summary (Last 24 hours) at 10/01/2017 0942 Last data filed at 10/01/2017 0600 Gross per 24 hour  Intake 490 ml  Output 40 ml  Net 450 ml   Filed Weights   09/29/17 0540 09/30/17 0556 10/01/17 0532  Weight: 98 kg (216 lb 0.8 oz) 98.5 kg (217 lb 2.5 oz) 96.6 kg (212 lb 15.4 oz)    Exam: Awake Alert, Oriented *3, No new F.N deficits, Normal affect [place].AT,PERRAL Supple Neck,No JVD, No cervical lymphadenopathy appriciated.  Symmetrical Chest wall movement, Good air movement bilaterally, CTAB RRR,No Gallops,Rubs or new Murmurs, No Parasternal Heave +ve B.Sounds, Abd Soft, Non tender, No organomegaly appriciated, No rebound -guarding or rigidity. No Cyanosis, Clubbing or edema, No new Rash or bruise   PERTINENT RADIOLOGIC STUDIES: Ct Abdomen Pelvis W Contrast  Result Date: 09/29/2017 CLINICAL DATA:  Status post cholecystectomy for acute gangrenous cholecystitis. Laparoscopic cholecystectomy performed 09/21/2017. Constant nausea and vomiting. EXAM: CT ABDOMEN AND PELVIS WITH CONTRAST TECHNIQUE:  Multidetector CT imaging of the abdomen and pelvis was performed using the standard protocol following bolus administration of intravenous contrast. CONTRAST:  165m ISOVUE-300 IOPAMIDOL (ISOVUE-300) INJECTION 61% COMPARISON:  CT abdomen 09/19/2017 FINDINGS: Lower chest: No acute abnormality. Hepatobiliary: Normal liver. No focal hepatic mass. Interval cholecystectomy. Surgical drain in the gallbladder fossa. Small amount of fluid adjacent to the surgical clips at the cholecystectomy site likely reflecting postsurgical changes. No focal fluid collection to suggest a biloma, hematoma or abscess. No intrahepatic or extrahepatic biliary duct dilatation. Pancreas: Unremarkable. No pancreatic ductal dilatation or surrounding inflammatory changes. Spleen: Normal in size without focal abnormality. Adrenals/Urinary Tract: Adrenal glands are unremarkable. Kidneys are normal, without renal calculi, focal lesion, or hydronephrosis. Bladder is unremarkable. Stomach/Bowel: Stomach is distended with an air-fluid level. Air-fluid levels throughout the colon. No bowel dilatation. No pneumatosis, pneumoperitoneum or portal venous gas. No abdominal or pelvic free fluid. Vascular/Lymphatic: Normal caliber abdominal aorta. No lymphadenopathy. Reproductive: Prostate is unremarkable. Other: No abdominal wall hernia. Multiple areas of fatty infiltration in the subcutaneous fat with overlying skin thickening within the anterior abdominal wall likely reflecting laparoscopy ports. Musculoskeletal: No acute osseous abnormality. No aggressive osseous lesion. Calcified broad-based disc bulges at L3-4 and L4-5 with bilateral facet arthropathy. Spinal stenosis at L3-4 and L4-5. Broad-based disc osteophyte complex at L5-S1 with mild bilateral facet arthropathy. IMPRESSION: 1. Interval cholecystectomy with a surgical drain in the gallbladder fossa. No focal fluid collection to suggest a biloma, hematoma or abscess. 2. Air-fluid levels and mild  distension of the colon as can be seen with colonic ileus. 3. Lumbar spine spondylosis. Electronically Signed   By: HKathreen Devoid  On: 09/29/2017 16:23   Ct Abdomen Pelvis W Contrast  Result Date: 09/19/2017 CLINICAL DATA:  Abdominal distention. Nausea  and vomiting. No bowel movement in 2 days. EXAM: CT ABDOMEN AND PELVIS WITH CONTRAST TECHNIQUE: Multidetector CT imaging of the abdomen and pelvis was performed using the standard protocol following bolus administration of intravenous contrast. CONTRAST:  181m ISOVUE-300 IOPAMIDOL (ISOVUE-300) INJECTION 61% COMPARISON:  Ultrasound right upper quadrant 09/19/2017. Abdominal series 09/19/2017. CT pelvis 05/05/2014 FINDINGS: Lower chest: Mild dependent changes in the lung bases. Hepatobiliary: Gallbladder is distended. No wall thickening. No focal stones identified. No bile duct dilatation. No focal liver lesions. Pancreas: Unremarkable. No pancreatic ductal dilatation or surrounding inflammatory changes. Spleen: Normal in size without focal abnormality. Adrenals/Urinary Tract: Adrenal glands are unremarkable. Kidneys are normal, without renal calculi, focal lesion, or hydronephrosis. Bladder is unremarkable. Stomach/Bowel: Stomach, small bowel, and colon are mostly decompressed. Scattered stool within the colon. No wall thickening or inflammatory changes identified. Appendix is normal. Vascular/Lymphatic: Aortic atherosclerosis. No enlarged abdominal or pelvic lymph nodes. Reproductive: Prostate is unremarkable. Calcification of the vas deferens. Other: No free air or free fluid in the abdomen. Abdominal wall musculature appears intact. No mesenteric edema. Musculoskeletal: Mild degenerative changes in the spine and hips. No destructive bone lesions. IMPRESSION: 1. Distended gallbladder without wall thickening, stone, or infiltration. 2. Aortic atherosclerosis. 3. No bowel obstruction or inflammation. Electronically Signed   By: WLucienne CapersM.D.   On:  09/19/2017 06:19   UKoreaRenal  Result Date: 09/21/2017 CLINICAL DATA:  Acute renal failure EXAM: RENAL / URINARY TRACT ULTRASOUND COMPLETE COMPARISON:  CT abdomen and pelvis September 19, 2017 FINDINGS: Right Kidney: Length: 11.4 cm. Echogenicity and renal cortical thickness are within normal limits. No mass, perinephric fluid, or hydronephrosis visualized. No sonographically demonstrable calculus or ureterectasis. Left Kidney: Length: 11.4 cm. Echogenicity and renal cortical thickness are within normal limits. No mass, perinephric fluid, or hydronephrosis visualized. No sonographically demonstrable calculus or ureterectasis. Bladder: Appears normal for degree of bladder distention. IMPRESSION: Study within normal limits. Electronically Signed   By: WLowella GripIII M.D.   On: 09/21/2017 19:22   UKoreaAbdomen Limited  Result Date: 09/19/2017 CLINICAL DATA:  Right upper quadrant pain, epigastric pain, and vomiting. EXAM: ULTRASOUND ABDOMEN LIMITED RIGHT UPPER QUADRANT COMPARISON:  None. FINDINGS: Gallbladder: Gallbladder is distended and filled with echogenic material consistent with sludge and/or small stones. No gallbladder wall thickening. Murphy's sign is negative. Common bile duct: Diameter: 4 mm, normal Liver: No focal lesion identified. Within normal limits in parenchymal echogenicity. Portal vein is patent on color Doppler imaging with normal direction of blood flow towards the liver. IMPRESSION: Distended gallbladder filled with echogenic material consistent with sludge or small stones. No additional changes to suggest cholecystitis. Electronically Signed   By: WLucienne CapersM.D.   On: 09/19/2017 05:15   Dg Chest Port 1 View  Result Date: 09/21/2017 CLINICAL DATA:  Shortness of Breath EXAM: PORTABLE CHEST 1 VIEW COMPARISON:  09/19/2017 FINDINGS: Cardiac shadow is accentuated by the portable technique. Lungs are hypoinflated with mild bibasilar atelectatic change. No bony abnormality is noted.  IMPRESSION: Poor inspiratory effort with bibasilar atelectatic change. Electronically Signed   By: MInez CatalinaM.D.   On: 09/21/2017 07:56   Dg Abd 2 Views  Result Date: 09/26/2017 CLINICAL DATA:  Abdominal distention EXAM: ABDOMEN - 2 VIEW COMPARISON:  CT 09/19/2017 FINDINGS: Prior cholecystectomy. Nonobstructive bowel gas pattern with gas throughout nondistended large and small bowel. No free air organomegaly. IMPRESSION: Changes of cholecystectomy.  No obstruction or free air. Electronically Signed   By: KRolm BaptiseM.D.   On:  09/26/2017 12:02   Dg Abdomen Acute W/chest  Result Date: 09/19/2017 CLINICAL DATA:  Upper abdominal pain and nausea. No bowel movement over 2 days. EXAM: DG ABDOMEN ACUTE W/ 1V CHEST COMPARISON:  Chest 05/05/2014 FINDINGS: Normal heart size and pulmonary vascularity. No focal airspace disease or consolidation in the lungs. No blunting of costophrenic angles. No pneumothorax. Mediastinal contours appear intact. Gas and stool throughout the colon. Hepatic flexure of the colon is focally no small or large bowel distention. No free intra-abdominal air. No abnormal air-fluid levels. No radiopaque stones. Calcification in the vas deferens. Displaced suggesting an enlarged gallbladder. IMPRESSION: No evidence of active pulmonary disease. Right upper quadrant soft tissues suggest an enlarged gallbladder. Nonobstructive bowel gas pattern. Electronically Signed   By: Lucienne Capers M.D.   On: 09/19/2017 03:40     PERTINENT LAB RESULTS: CBC: Recent Labs    09/29/17 0442 09/30/17 0429  WBC 11.6* 12.0*  HGB 12.4* 12.8*  HCT 36.1* 37.1*  PLT 207 206   CMET CMP     Component Value Date/Time   NA 133 (L) 10/01/2017 0508   NA 139 03/05/2017 1145   K 3.5 10/01/2017 0508   CL 100 (L) 10/01/2017 0508   CO2 23 10/01/2017 0508   GLUCOSE 135 (H) 10/01/2017 0508   BUN 24 (H) 10/01/2017 0508   BUN 13 03/05/2017 1145   CREATININE 0.99 10/01/2017 0508   CREATININE 0.80  09/02/2016 0902   CALCIUM 8.3 (L) 10/01/2017 0508   PROT 5.7 (L) 09/26/2017 0003   PROT 6.8 03/05/2017 1145   ALBUMIN 2.5 (L) 09/26/2017 0003   ALBUMIN 4.3 03/05/2017 1145   AST 152 (H) 09/26/2017 0003   ALT 153 (H) 09/26/2017 0003   ALKPHOS 83 09/26/2017 0003   BILITOT 0.7 09/26/2017 0003   BILITOT 0.9 03/05/2017 1145   GFRNONAA >60 10/01/2017 0508   GFRNONAA >89 07/21/2016 0956   GFRAA >60 10/01/2017 0508   GFRAA >89 07/21/2016 0956    GFR Estimated Creatinine Clearance: 93.6 mL/min (by C-G formula based on SCr of 0.99 mg/dL). No results for input(s): LIPASE, AMYLASE in the last 72 hours. No results for input(s): CKTOTAL, CKMB, CKMBINDEX, TROPONINI in the last 72 hours. Invalid input(s): POCBNP No results for input(s): DDIMER in the last 72 hours. No results for input(s): HGBA1C in the last 72 hours. No results for input(s): CHOL, HDL, LDLCALC, TRIG, CHOLHDL, LDLDIRECT in the last 72 hours. No results for input(s): TSH, T4TOTAL, T3FREE, THYROIDAB in the last 72 hours.  Invalid input(s): FREET3 No results for input(s): VITAMINB12, FOLATE, FERRITIN, TIBC, IRON, RETICCTPCT in the last 72 hours. Coags: No results for input(s): INR in the last 72 hours.  Invalid input(s): PT Microbiology: No results found for this or any previous visit (from the past 240 hour(s)).  FURTHER DISCHARGE INSTRUCTIONS:  Get Medicines reviewed and adjusted: Please take all your medications with you for your next visit with your Primary MD  Laboratory/radiological data: Please request your Primary MD to go over all hospital tests and procedure/radiological results at the follow up, please ask your Primary MD to get all Hospital records sent to his/her office.  In some cases, they will be blood work, cultures and biopsy results pending at the time of your discharge. Please request that your primary care M.D. goes through all the records of your hospital data and follows up on these results.  Also  Note the following: If you experience worsening of your admission symptoms, develop shortness of breath, life threatening  emergency, suicidal or homicidal thoughts you must seek medical attention immediately by calling 911 or calling your MD immediately  if symptoms less severe.  You must read complete instructions/literature along with all the possible adverse reactions/side effects for all the Medicines you take and that have been prescribed to you. Take any new Medicines after you have completely understood and accpet all the possible adverse reactions/side effects.   Do not drive when taking Pain medications or sleeping medications (Benzodaizepines)  Do not take more than prescribed Pain, Sleep and Anxiety Medications. It is not advisable to combine anxiety,sleep and pain medications without talking with your primary care practitioner  Special Instructions: If you have smoked or chewed Tobacco  in the last 2 yrs please stop smoking, stop any regular Alcohol  and or any Recreational drug use.  Wear Seat belts while driving.  Please note: You were cared for by a hospitalist during your hospital stay. Once you are discharged, your primary care physician will handle any further medical issues. Please note that NO REFILLS for any discharge medications will be authorized once you are discharged, as it is imperative that you return to your primary care physician (or establish a relationship with a primary care physician if you do not have one) for your post hospital discharge needs so that they can reassess your need for medications and monitor your lab values.  Total Time spent coordinating discharge including counseling, education and face to face time equals  45 minutes.  SignedOren Binet 10/01/2017 9:42 AM

## 2017-10-01 NOTE — Progress Notes (Signed)
Pt was discharged home today. Instructions were reviewed with patient, prescriptions were given  and questions were answered. Pt was taken to main entrance via wheelchair by NT.

## 2017-10-04 DIAGNOSIS — Z7982 Long term (current) use of aspirin: Secondary | ICD-10-CM | POA: Diagnosis not present

## 2017-10-04 DIAGNOSIS — E1165 Type 2 diabetes mellitus with hyperglycemia: Secondary | ICD-10-CM | POA: Diagnosis not present

## 2017-10-04 DIAGNOSIS — E1142 Type 2 diabetes mellitus with diabetic polyneuropathy: Secondary | ICD-10-CM | POA: Diagnosis not present

## 2017-10-04 DIAGNOSIS — E11319 Type 2 diabetes mellitus with unspecified diabetic retinopathy without macular edema: Secondary | ICD-10-CM | POA: Diagnosis not present

## 2017-10-04 DIAGNOSIS — Z48815 Encounter for surgical aftercare following surgery on the digestive system: Secondary | ICD-10-CM | POA: Diagnosis not present

## 2017-10-04 DIAGNOSIS — Z794 Long term (current) use of insulin: Secondary | ICD-10-CM | POA: Diagnosis not present

## 2017-10-04 DIAGNOSIS — Z955 Presence of coronary angioplasty implant and graft: Secondary | ICD-10-CM | POA: Diagnosis not present

## 2017-10-04 DIAGNOSIS — I252 Old myocardial infarction: Secondary | ICD-10-CM | POA: Diagnosis not present

## 2017-10-04 DIAGNOSIS — I2510 Atherosclerotic heart disease of native coronary artery without angina pectoris: Secondary | ICD-10-CM | POA: Diagnosis not present

## 2017-10-04 DIAGNOSIS — I5030 Unspecified diastolic (congestive) heart failure: Secondary | ICD-10-CM | POA: Diagnosis not present

## 2017-10-04 DIAGNOSIS — I110 Hypertensive heart disease with heart failure: Secondary | ICD-10-CM | POA: Diagnosis not present

## 2017-10-05 DIAGNOSIS — E11319 Type 2 diabetes mellitus with unspecified diabetic retinopathy without macular edema: Secondary | ICD-10-CM | POA: Diagnosis not present

## 2017-10-05 DIAGNOSIS — Z955 Presence of coronary angioplasty implant and graft: Secondary | ICD-10-CM | POA: Diagnosis not present

## 2017-10-05 DIAGNOSIS — I110 Hypertensive heart disease with heart failure: Secondary | ICD-10-CM | POA: Diagnosis not present

## 2017-10-05 DIAGNOSIS — Z794 Long term (current) use of insulin: Secondary | ICD-10-CM | POA: Diagnosis not present

## 2017-10-05 DIAGNOSIS — I5030 Unspecified diastolic (congestive) heart failure: Secondary | ICD-10-CM | POA: Diagnosis not present

## 2017-10-05 DIAGNOSIS — E1165 Type 2 diabetes mellitus with hyperglycemia: Secondary | ICD-10-CM | POA: Diagnosis not present

## 2017-10-05 DIAGNOSIS — I252 Old myocardial infarction: Secondary | ICD-10-CM | POA: Diagnosis not present

## 2017-10-05 DIAGNOSIS — E1142 Type 2 diabetes mellitus with diabetic polyneuropathy: Secondary | ICD-10-CM | POA: Diagnosis not present

## 2017-10-05 DIAGNOSIS — Z7982 Long term (current) use of aspirin: Secondary | ICD-10-CM | POA: Diagnosis not present

## 2017-10-05 DIAGNOSIS — Z48815 Encounter for surgical aftercare following surgery on the digestive system: Secondary | ICD-10-CM | POA: Diagnosis not present

## 2017-10-05 DIAGNOSIS — I2510 Atherosclerotic heart disease of native coronary artery without angina pectoris: Secondary | ICD-10-CM | POA: Diagnosis not present

## 2017-10-06 DIAGNOSIS — E1165 Type 2 diabetes mellitus with hyperglycemia: Secondary | ICD-10-CM | POA: Diagnosis not present

## 2017-10-06 DIAGNOSIS — I252 Old myocardial infarction: Secondary | ICD-10-CM | POA: Diagnosis not present

## 2017-10-06 DIAGNOSIS — Z48815 Encounter for surgical aftercare following surgery on the digestive system: Secondary | ICD-10-CM | POA: Diagnosis not present

## 2017-10-06 DIAGNOSIS — I110 Hypertensive heart disease with heart failure: Secondary | ICD-10-CM | POA: Diagnosis not present

## 2017-10-06 DIAGNOSIS — Z955 Presence of coronary angioplasty implant and graft: Secondary | ICD-10-CM | POA: Diagnosis not present

## 2017-10-06 DIAGNOSIS — I5030 Unspecified diastolic (congestive) heart failure: Secondary | ICD-10-CM | POA: Diagnosis not present

## 2017-10-06 DIAGNOSIS — Z794 Long term (current) use of insulin: Secondary | ICD-10-CM | POA: Diagnosis not present

## 2017-10-06 DIAGNOSIS — I2510 Atherosclerotic heart disease of native coronary artery without angina pectoris: Secondary | ICD-10-CM | POA: Diagnosis not present

## 2017-10-06 DIAGNOSIS — E11319 Type 2 diabetes mellitus with unspecified diabetic retinopathy without macular edema: Secondary | ICD-10-CM | POA: Diagnosis not present

## 2017-10-06 DIAGNOSIS — E1142 Type 2 diabetes mellitus with diabetic polyneuropathy: Secondary | ICD-10-CM | POA: Diagnosis not present

## 2017-10-06 DIAGNOSIS — Z7982 Long term (current) use of aspirin: Secondary | ICD-10-CM | POA: Diagnosis not present

## 2017-10-07 DIAGNOSIS — E113511 Type 2 diabetes mellitus with proliferative diabetic retinopathy with macular edema, right eye: Secondary | ICD-10-CM | POA: Diagnosis not present

## 2017-10-07 DIAGNOSIS — H35371 Puckering of macula, right eye: Secondary | ICD-10-CM | POA: Diagnosis not present

## 2017-10-07 DIAGNOSIS — H4050X3 Glaucoma secondary to other eye disorders, unspecified eye, severe stage: Secondary | ICD-10-CM | POA: Diagnosis not present

## 2017-10-07 DIAGNOSIS — H211X1 Other vascular disorders of iris and ciliary body, right eye: Secondary | ICD-10-CM | POA: Diagnosis not present

## 2017-10-08 DIAGNOSIS — I2510 Atherosclerotic heart disease of native coronary artery without angina pectoris: Secondary | ICD-10-CM | POA: Diagnosis not present

## 2017-10-08 DIAGNOSIS — E11319 Type 2 diabetes mellitus with unspecified diabetic retinopathy without macular edema: Secondary | ICD-10-CM | POA: Diagnosis not present

## 2017-10-08 DIAGNOSIS — Z7982 Long term (current) use of aspirin: Secondary | ICD-10-CM | POA: Diagnosis not present

## 2017-10-08 DIAGNOSIS — I110 Hypertensive heart disease with heart failure: Secondary | ICD-10-CM | POA: Diagnosis not present

## 2017-10-08 DIAGNOSIS — E1165 Type 2 diabetes mellitus with hyperglycemia: Secondary | ICD-10-CM | POA: Diagnosis not present

## 2017-10-08 DIAGNOSIS — I252 Old myocardial infarction: Secondary | ICD-10-CM | POA: Diagnosis not present

## 2017-10-08 DIAGNOSIS — Z48815 Encounter for surgical aftercare following surgery on the digestive system: Secondary | ICD-10-CM | POA: Diagnosis not present

## 2017-10-08 DIAGNOSIS — E1142 Type 2 diabetes mellitus with diabetic polyneuropathy: Secondary | ICD-10-CM | POA: Diagnosis not present

## 2017-10-08 DIAGNOSIS — I5030 Unspecified diastolic (congestive) heart failure: Secondary | ICD-10-CM | POA: Diagnosis not present

## 2017-10-08 DIAGNOSIS — Z794 Long term (current) use of insulin: Secondary | ICD-10-CM | POA: Diagnosis not present

## 2017-10-08 DIAGNOSIS — Z955 Presence of coronary angioplasty implant and graft: Secondary | ICD-10-CM | POA: Diagnosis not present

## 2017-10-09 ENCOUNTER — Telehealth: Payer: Self-pay | Admitting: Internal Medicine

## 2017-10-09 NOTE — Telephone Encounter (Signed)
Caryl Pina from [facility] called to check on paperwork for patients walker. Please fu.

## 2017-10-09 NOTE — Telephone Encounter (Signed)
Returned [place] call from [facility] to see if we can get another fax sent over for pt because we have not received anything had to lvm informing her of this information and left fax number and call back number if she has any questions or concerns

## 2017-10-13 ENCOUNTER — Ambulatory Visit: Payer: Medicare Other | Attending: Internal Medicine | Admitting: Internal Medicine

## 2017-10-13 ENCOUNTER — Encounter: Payer: Self-pay | Admitting: Internal Medicine

## 2017-10-13 VITALS — BP 129/77 | HR 65 | Temp 98.2°F | Resp 12 | Ht 70.0 in | Wt 203.8 lb

## 2017-10-13 DIAGNOSIS — E11319 Type 2 diabetes mellitus with unspecified diabetic retinopathy without macular edema: Secondary | ICD-10-CM | POA: Insufficient documentation

## 2017-10-13 DIAGNOSIS — Z79899 Other long term (current) drug therapy: Secondary | ICD-10-CM | POA: Insufficient documentation

## 2017-10-13 DIAGNOSIS — L84 Corns and callosities: Secondary | ICD-10-CM | POA: Insufficient documentation

## 2017-10-13 DIAGNOSIS — I10 Essential (primary) hypertension: Secondary | ICD-10-CM

## 2017-10-13 DIAGNOSIS — D649 Anemia, unspecified: Secondary | ICD-10-CM | POA: Diagnosis not present

## 2017-10-13 DIAGNOSIS — H409 Unspecified glaucoma: Secondary | ICD-10-CM | POA: Diagnosis not present

## 2017-10-13 DIAGNOSIS — I2510 Atherosclerotic heart disease of native coronary artery without angina pectoris: Secondary | ICD-10-CM | POA: Diagnosis not present

## 2017-10-13 DIAGNOSIS — IMO0002 Reserved for concepts with insufficient information to code with codable children: Secondary | ICD-10-CM

## 2017-10-13 DIAGNOSIS — Z9049 Acquired absence of other specified parts of digestive tract: Secondary | ICD-10-CM

## 2017-10-13 DIAGNOSIS — E1165 Type 2 diabetes mellitus with hyperglycemia: Secondary | ICD-10-CM | POA: Insufficient documentation

## 2017-10-13 DIAGNOSIS — I951 Orthostatic hypotension: Secondary | ICD-10-CM | POA: Diagnosis not present

## 2017-10-13 DIAGNOSIS — Z794 Long term (current) use of insulin: Secondary | ICD-10-CM | POA: Diagnosis not present

## 2017-10-13 DIAGNOSIS — E1142 Type 2 diabetes mellitus with diabetic polyneuropathy: Secondary | ICD-10-CM | POA: Diagnosis not present

## 2017-10-13 DIAGNOSIS — I110 Hypertensive heart disease with heart failure: Secondary | ICD-10-CM | POA: Diagnosis not present

## 2017-10-13 DIAGNOSIS — I252 Old myocardial infarction: Secondary | ICD-10-CM | POA: Diagnosis not present

## 2017-10-13 DIAGNOSIS — E785 Hyperlipidemia, unspecified: Secondary | ICD-10-CM | POA: Insufficient documentation

## 2017-10-13 DIAGNOSIS — E118 Type 2 diabetes mellitus with unspecified complications: Secondary | ICD-10-CM | POA: Diagnosis not present

## 2017-10-13 DIAGNOSIS — Z7982 Long term (current) use of aspirin: Secondary | ICD-10-CM | POA: Insufficient documentation

## 2017-10-13 DIAGNOSIS — Z955 Presence of coronary angioplasty implant and graft: Secondary | ICD-10-CM | POA: Insufficient documentation

## 2017-10-13 DIAGNOSIS — I5030 Unspecified diastolic (congestive) heart failure: Secondary | ICD-10-CM | POA: Diagnosis not present

## 2017-10-13 DIAGNOSIS — Z48815 Encounter for surgical aftercare following surgery on the digestive system: Secondary | ICD-10-CM | POA: Diagnosis not present

## 2017-10-13 LAB — GLUCOSE, POCT (MANUAL RESULT ENTRY): POC GLUCOSE: 245 mg/dL — AB (ref 70–99)

## 2017-10-13 MED ORDER — INSULIN GLARGINE 100 UNIT/ML SOLOSTAR PEN: 17.0000 [IU] | PEN_INJECTOR | Freq: Every day | SUBCUTANEOUS | 6 refills | Status: DC

## 2017-10-13 MED ORDER — INSULIN LISPRO 100 UNIT/ML (KWIKPEN): 5.0000 [IU] | PEN_INJECTOR | Freq: Two times a day (BID) | SUBCUTANEOUS | 11 refills | Status: DC

## 2017-10-13 NOTE — Progress Notes (Signed)
Patient ID: Derek Lowery, male    DOB: May 12, 1956  MRN: [id number]  CC: Hospital follow-up  Subjective: Derek Lowery is a 62 y.o. male who presents for hospital follow-up. His concerns today include:  62 yr old male with hx of DM type 2 with retinopathy(laser treatments by Dr. Beather Arbour neuropathy, and microalbuminuria,HTN, CAD with stent OM2 in 2012, glaucoma (blind LT eye) and HL  Patient hospitalized 2/1-14/2019 with cholecystitis.  Patient found to have acute gangrenous cholecystitis and he is status post cholecystectomy.  J-tube was left in place.  This has since been removed on his follow-up appointment with general surgeon last week.  Hospital course complicated with persistent nausea and vomiting.  This improved with Reglan.  Lisinopril HCTZ was held due to acute kidney injury.  Patient was also found to have a right foot ulcer.  This was stable on discharge.  Patient has been doing dressing changes daily.  Today: 1.  Patient doing better.  Nausea/vomiting has resolved.  Tolerating regular diet.  Moving bowels okay.  He has not had to use Reglan much since discharge.  Last use about a week ago.  He has lost 23 pounds since last visit with me.  Patient attributes this to her recent illness/surgery  2.  DM: Lantus dose was decreased during hospitalization from 30 units to 15 units daily.  He has continued with the 15 units and 3 units of Humalog with breakfast and dinner.  Checking blood sugars twice a day.  He has log with him.  A.m. range is 108-191.  Range before dinner is 152-249  4.  HTN: Patient was discharged on amlodipine 10 mg daily and lisinopril/HCTZ was held.  Patient reports mild lightheadedness at times with standing since being on the amlodipine.  He has had lower extremity edema. -No shortness of breath or chest pains.  5.  Also on the ball of the right fifth metatarsal.  He has been cleaning it with peroxide and wrapping it every day.  No pain. Patient  Active Problem List   Diagnosis Date Noted  . ARF (acute renal failure) ([place])   . Acute gangrenous cholecystitis s/p lap cholecystectomy 09/21/2017 09/19/2017  . Immunization due 06/09/2017  . Chronic diarrhea 06/09/2017  . Microalbuminuria 03/07/2017  . Dry skin 03/05/2017  . Glaucoma 03/05/2017  . Legally blind 03/05/2017  . Retinopathy due to secondary diabetes ([place]) 02/06/2016  . Diabetic polyneuropathy associated with type 2 diabetes mellitus ([place]) 01/12/2015  . Fournier's gangrene into true pelvis s/p I&D 05/05/2014 05/05/2014  . Poorly controlled type 2 diabetes mellitus ([place]) 07/17/2011  . HLD (hyperlipidemia) 07/17/2011  . CAD in native artery 07/17/2011  . Essential hypertension 07/17/2011     Current Outpatient Medications on File Prior to Visit  Medication Sig Dispense Refill  . aspirin EC 81 MG tablet Take 1 tablet (81 mg total) by mouth daily. 30 tablet 4  . atorvastatin (LIPITOR) 40 MG tablet TAKE 1 TABLET BY MOUTH EVERY DAY FOR CHOLESTEROL 90 tablet 3  . dorzolamide (TRUSOPT) 2 % ophthalmic solution Place 1 drop into the right eye 2 (two) times daily.    . metoCLOPramide (REGLAN) 10 MG tablet Take 1 tablet (10 mg total) by mouth every 6 (six) hours as needed for nausea. 30 tablet 0  . Multiple Vitamins-Minerals (MULTIVITAMIN WITH MINERALS) tablet Take 1 tablet by mouth daily.    . timolol (BETIMOL) 0.5 % ophthalmic solution Place 1 drop into the right eye 2 (two) times daily.    Marland Kitchen  traMADol (ULTRAM) 50 MG tablet Take 1 tablet (50 mg total) by mouth every 6 (six) hours as needed for moderate pain or severe pain. 15 tablet 0  . feeding supplement, GLUCERNA SHAKE, (GLUCERNA SHAKE) LIQD Take 237 mLs by mouth 2 (two) times daily between meals. 60 Can 0  . glucose blood (ACCU-CHEK AVIVA PLUS) test strip Use as instructed for 3 times daily testing of blood sugar. E11.9 100 each 0  . glucose monitoring kit (FREESTYLE) monitoring kit 1 each by Does not apply route as needed for  other. 1 each 0  . Insulin Pen Needle (BD PEN NEEDLE NANO U/F) 32G X 4 MM MISC Use as directed 100 each 5  . lisinopril-hydrochlorothiazide (PRINZIDE,ZESTORETIC) 10-12.5 MG tablet Take 1 tablet by mouth daily. (Patient not taking: Reported on 10/13/2017) 90 tablet 3   Current Facility-Administered Medications on File Prior to Visit  Medication Dose Route Frequency Provider Last Rate Last Dose  . 0.9 %  sodium chloride infusion  500 mL Intravenous Continuous Gatha Mayer, MD        No Known Allergies  Social History   Socioeconomic History  . Marital status: Single    Spouse name: Not on file  . Number of children: Not on file  . Years of education: Not on file  . Highest education level: Not on file  Social Needs  . Financial resource strain: Not on file  . Food insecurity - worry: Not on file  . Food insecurity - inability: Not on file  . Transportation needs - medical: Not on file  . Transportation needs - non-medical: Not on file  Occupational History  . Occupation: Theatre stage manager: [employer]  Tobacco Use  . Smoking status: Never Smoker  . Smokeless tobacco: Never Used  Substance and Sexual Activity  . Alcohol use: No  . Drug use: No  . Sexual activity: No  Other Topics Concern  . Not on file  Social History Narrative   Works at [employer]    Family History  Problem Relation Age of Onset  . Coronary artery disease Father        Developed in his 17s  . Kidney disease Father   . Diabetes Father   . Melanoma Father   . Rectal cancer Father   . Heart failure Mother   . Hypertension Mother   . Diabetes Sister   . Diabetes Brother   . Colon cancer Neg Hx   . Esophageal cancer Neg Hx   . Stomach cancer Neg Hx     Past Surgical History:  Procedure Laterality Date  . CHOLECYSTECTOMY N/A 09/21/2017   Procedure: LAPAROSCOPIC CHOLECYSTECTOMY WITH INTRAOPERATIVE CHOLANGIOGRAM;  Surgeon: Michael Boston, MD;  Location: [facility];  Service:  General;  Laterality: N/A;  . COLONOSCOPY  2000   Dr. Collene Mares  . EYE SURGERY    . IRRIGATION AND DEBRIDEMENT ABSCESS Left 05/05/2014   Procedure: IRRIGATION AND DEBRIDEMENT ABSCESS left buttock;  Surgeon: Michael Boston, MD;  Location: [facility];  Service: General;  Laterality: Left;  . LEFT HEART CATHETERIZATION WITH CORONARY ANGIOGRAM N/A 07/18/2011   Procedure: LEFT HEART CATHETERIZATION WITH CORONARY ANGIOGRAM;  Surgeon: Hillary Bow, MD;  Location: [facility] CATH LAB;  Service: Cardiovascular;  Laterality: N/A;  . PERCUTANEOUS CORONARY STENT INTERVENTION (PCI-S)  07/18/2011   Procedure: PERCUTANEOUS CORONARY STENT INTERVENTION (PCI-S);  Surgeon: Hillary Bow, MD;  Location: [facility] C[facility] LAB;  Service: Cardiovascular;;    ROS: Review of  Systems Negative except as stated above PHYSICAL EXAM: BP 129/77 (BP Location: Left Arm, Patient Position: Sitting, Cuff Size: Normal)   Pulse 65   Temp 98.2 F (36.8 C) (Oral)   Resp 12   Ht _0  (1.778 m)   Wt 203 lb 12.8 oz (92.4 kg)   SpO2 97%   BMI 29.24 kg/m   Wt Readings from Last 3 Encounters:  10/13/17 203 lb 12.8 oz (92.4 kg)  10/01/17 212 lb 15.4 oz (96.6 kg)  07/13/17 226 lb (102.5 kg)  BP sitting 120/60; BP standing 104/58  Physical Exam  General appearance -Caucasian male who looks older than stated age.  He has noted weight loss since I last saw him.  Clothing is soiled.  Appears unkempt Mental status - alert, oriented to person, place, and time Eyes -pale conjunctiva Neck - supple, no significant adenopathy Chest - clear to auscultation, no wheezes, rales or rhonchi, symmetric air entry Heart - normal rate, regular rhythm, normal S1, S2, no murmurs, rubs, clicks or gallops  Abdomen: Moderate old ecchymosis around periumbilical area extending out to about 7-8 cm.  Nontender to touch. Extremities -1+ bilateral lower extremity edema including ankles  Diabetic Foot Exam - Simple   Simple Foot Form Visual Inspection See comments:   Yes Sensation Testing See comments:  Yes Pulse Check Posterior Tibialis and Dorsalis pulse intact bilaterally:  Yes Comments Toenails are overgrown.  Skin dry with some scaly white plaques on dorsal surface of both feet.  3 cm pre-ulcerative callus on the plantar surface of the right fourth to fifth distal metatarsal.  Leap exam abnormal with decreased sensation on plantar surface        Results for orders placed or performed in visit on 10/13/17  POCT glucose (manual entry)  Result Value Ref Range   POC Glucose 245 (A) 70 - 99 mg/dl   Lab Results  Component Value Date   WBC 12.0 (H) 09/30/2017   HGB 12.8 (L) 09/30/2017   HCT 37.1 (L) 09/30/2017   MCV 83.9 09/30/2017   PLT 206 09/30/2017     Chemistry      Component Value Date/Time   NA 133 (L) 10/01/2017 0508   NA 139 03/05/2017 1145   K 3.5 10/01/2017 0508   CL 100 (L) 10/01/2017 0508   CO2 23 10/01/2017 0508   BUN 24 (H) 10/01/2017 0508   BUN 13 03/05/2017 1145   CREATININE 0.99 10/01/2017 0508   CREATININE 0.80 09/02/2016 0902      Component Value Date/Time   CALCIUM 8.3 (L) 10/01/2017 0508   ALKPHOS 83 09/26/2017 0003   AST 152 (H) 09/26/2017 0003   ALT 153 (H) 09/26/2017 0003   BILITOT 0.7 09/26/2017 0003   BILITOT 0.9 03/05/2017 1145      ASSESSMENT AND PLAN: 1. Diabetes mellitus type 2, uncontrolled, with complications (HCC) -Blood sugars with large fluctuations.  Increase Lantus from 15 units to 17 units.  Increase Humalog from 3-5 units.  Continue to monitor blood sugars and bring in readings on next visit - POCT glucose (manual entry) - Insulin Glargine (LANTUS SOLOSTAR) 100 UNIT/ML Solostar Pen; Inject 17 Units into the skin daily.  Dispense: 15 mL; Refill: 6 - insulin lispro (HUMALOG KWIKPEN) 100 UNIT/ML KiwkPen; Inject 0.05 mLs (5 Units total) into the skin 2 (two) times daily with a meal. Breakfast and supper, and pen needles 3/day  Dispense: 15 mL; Refill: 11 - Comprehensive metabolic panel  2.  Essential hypertension -Controlled.   Stop  amlodipine due to lower extremity edema.  Restart lisinopril/ HCTZ 10/12.5 mg but will have him take just 1/2 tab for now.  -check BMP  3. Orthostasis -encourage fluid intake. Go slow with position changes. Stop Amlodipine.  4. Pre-ulcerative corn or callous Rxn given for DM shoes - Ambulatory referral to Podiatry  5. S/P cholecystectomy   6. Anemia, unspecified type - CBC - Iron, TIBC and Ferritin Panel  Patient was given the opportunity to ask questions.  Patient verbalized understanding of the plan and was able to repeat key elements of the plan.   Orders Placed This Encounter  Procedures  . Comprehensive metabolic panel  . CBC  . Iron, TIBC and Ferritin Panel  . Ambulatory referral to Podiatry  . POCT glucose (manual entry)     Requested Prescriptions   Signed Prescriptions Disp Refills  . Insulin Glargine (LANTUS SOLOSTAR) 100 UNIT/ML Solostar Pen 15 mL 6    Sig: Inject 17 Units into the skin daily.  . insulin lispro (HUMALOG KWIKPEN) 100 UNIT/ML KiwkPen 15 mL 11    Sig: Inject 0.05 mLs (5 Units total) into the skin 2 (two) times daily with a meal. Breakfast and supper, and pen needles 3/day    Return in about 1 week (around 10/20/2017).  Karle Plumber, MD, FACP

## 2017-10-13 NOTE — Patient Instructions (Addendum)
Increase Lantus to 17 units daily.  Increase Humalog to 5 units with breakfast and dinner. Continue to monitor blood sugars.  Stop Amlodipine.  Restart Lisinopril/HCTZ but take only 1/2 tablet daily.   Limit salt in the foods.  Drink several glasses of fluids daily.  Go slow with position changes.   You have been referred to podiatry.  I have given you a prescription to get a pair of diabetic shoes.

## 2017-10-13 NOTE — Progress Notes (Signed)
Hospital follow up today Sore on the bottom of right foot Has not started Prinzinde, did not know it was prescribed

## 2017-10-14 ENCOUNTER — Telehealth: Payer: Self-pay | Admitting: Internal Medicine

## 2017-10-14 DIAGNOSIS — Z7982 Long term (current) use of aspirin: Secondary | ICD-10-CM | POA: Diagnosis not present

## 2017-10-14 DIAGNOSIS — I2510 Atherosclerotic heart disease of native coronary artery without angina pectoris: Secondary | ICD-10-CM | POA: Diagnosis not present

## 2017-10-14 DIAGNOSIS — Z48815 Encounter for surgical aftercare following surgery on the digestive system: Secondary | ICD-10-CM | POA: Diagnosis not present

## 2017-10-14 DIAGNOSIS — E1142 Type 2 diabetes mellitus with diabetic polyneuropathy: Secondary | ICD-10-CM | POA: Diagnosis not present

## 2017-10-14 DIAGNOSIS — I5030 Unspecified diastolic (congestive) heart failure: Secondary | ICD-10-CM | POA: Diagnosis not present

## 2017-10-14 DIAGNOSIS — I110 Hypertensive heart disease with heart failure: Secondary | ICD-10-CM | POA: Diagnosis not present

## 2017-10-14 DIAGNOSIS — Z794 Long term (current) use of insulin: Secondary | ICD-10-CM | POA: Diagnosis not present

## 2017-10-14 DIAGNOSIS — Z955 Presence of coronary angioplasty implant and graft: Secondary | ICD-10-CM | POA: Diagnosis not present

## 2017-10-14 DIAGNOSIS — I252 Old myocardial infarction: Secondary | ICD-10-CM | POA: Diagnosis not present

## 2017-10-14 DIAGNOSIS — E1165 Type 2 diabetes mellitus with hyperglycemia: Secondary | ICD-10-CM | POA: Diagnosis not present

## 2017-10-14 DIAGNOSIS — E11319 Type 2 diabetes mellitus with unspecified diabetic retinopathy without macular edema: Secondary | ICD-10-CM | POA: Diagnosis not present

## 2017-10-14 LAB — COMPREHENSIVE METABOLIC PANEL
ALBUMIN: 3.8 g/dL (ref 3.5–5.5)
ALT: 40 IU/L (ref 0–44)
AST: 29 IU/L (ref 0–40)
Albumin/Globulin Ratio: 1.3 (ref 1.2–2.2)
Alkaline Phosphatase: 83 IU/L (ref 39–117)
BILIRUBIN TOTAL: 0.7 mg/dL (ref 0.0–1.2)
BUN / CREAT RATIO: 20 (ref 9–20)
BUN: 18 mg/dL (ref 6–24)
CALCIUM: 9.1 mg/dL (ref 8.7–10.2)
CHLORIDE: 101 mmol/L (ref 96–106)
CO2: 27 mmol/L (ref 20–29)
CREATININE: 0.89 mg/dL (ref 0.76–1.27)
GFR calc Af Amer: 108 mL/min/{1.73_m2} (ref >59)
GFR, EST NON AFRICAN AMERICAN: 94 mL/min/{1.73_m2} (ref >59)
GLUCOSE: 210 mg/dL — AB (ref 65–99)
Globulin, Total: 2.9 g/dL (ref 1.5–4.5)
Potassium: 5 mmol/L (ref 3.5–5.2)
Sodium: 139 mmol/L (ref 134–144)
Total Protein: 6.7 g/dL (ref 6.0–8.5)

## 2017-10-14 LAB — CBC
HEMATOCRIT: 34.1 % — AB (ref 37.5–51.0)
HEMOGLOBIN: 11.6 g/dL — AB (ref 13.0–17.7)
MCH: 29.2 pg (ref 26.6–33.0)
MCHC: 34 g/dL (ref 31.5–35.7)
MCV: 86 fL (ref 79–97)
Platelets: 157 10*3/uL (ref 150–379)
RBC: 3.97 x10E6/uL — ABNORMAL LOW (ref 4.14–5.80)
RDW: 14 % (ref 12.3–15.4)
WBC: 5.4 10*3/uL (ref 3.4–10.8)

## 2017-10-14 LAB — IRON,TIBC AND FERRITIN PANEL
FERRITIN: 375 ng/mL (ref 30–400)
Iron Saturation: 38 % (ref 15–55)
Iron: 77 ug/dL (ref 38–169)
Total Iron Binding Capacity: 205 ug/dL — ABNORMAL LOW (ref 250–450)
UIBC: 128 ug/dL (ref 111–343)

## 2017-10-14 NOTE — Telephone Encounter (Signed)
Received fax from [facility] order, fax will be on the pcp in-box

## 2017-10-15 ENCOUNTER — Ambulatory Visit: Payer: Medicare Other | Admitting: Internal Medicine

## 2017-10-15 DIAGNOSIS — I5030 Unspecified diastolic (congestive) heart failure: Secondary | ICD-10-CM | POA: Diagnosis not present

## 2017-10-15 DIAGNOSIS — I252 Old myocardial infarction: Secondary | ICD-10-CM | POA: Diagnosis not present

## 2017-10-15 DIAGNOSIS — I110 Hypertensive heart disease with heart failure: Secondary | ICD-10-CM | POA: Diagnosis not present

## 2017-10-15 DIAGNOSIS — E1142 Type 2 diabetes mellitus with diabetic polyneuropathy: Secondary | ICD-10-CM | POA: Diagnosis not present

## 2017-10-15 DIAGNOSIS — I2510 Atherosclerotic heart disease of native coronary artery without angina pectoris: Secondary | ICD-10-CM | POA: Diagnosis not present

## 2017-10-15 DIAGNOSIS — Z794 Long term (current) use of insulin: Secondary | ICD-10-CM | POA: Diagnosis not present

## 2017-10-15 DIAGNOSIS — Z7982 Long term (current) use of aspirin: Secondary | ICD-10-CM | POA: Diagnosis not present

## 2017-10-15 DIAGNOSIS — E1165 Type 2 diabetes mellitus with hyperglycemia: Secondary | ICD-10-CM | POA: Diagnosis not present

## 2017-10-15 DIAGNOSIS — Z48815 Encounter for surgical aftercare following surgery on the digestive system: Secondary | ICD-10-CM | POA: Diagnosis not present

## 2017-10-15 DIAGNOSIS — Z955 Presence of coronary angioplasty implant and graft: Secondary | ICD-10-CM | POA: Diagnosis not present

## 2017-10-15 DIAGNOSIS — E11319 Type 2 diabetes mellitus with unspecified diabetic retinopathy without macular edema: Secondary | ICD-10-CM | POA: Diagnosis not present

## 2017-10-16 NOTE — Telephone Encounter (Signed)
Returned [employer] called and gave verbal order to treat dm ulcer on foot per Dr. Wynetta Emery

## 2017-10-16 NOTE — Telephone Encounter (Signed)
Donita from [facility] called stating that pt. Has a diabetic ulcer on the bottom of his foot and she would like verbal order to treat. Please f/u

## 2017-10-20 ENCOUNTER — Ambulatory Visit: Payer: Medicare Other | Attending: Internal Medicine | Admitting: Internal Medicine

## 2017-10-20 ENCOUNTER — Encounter: Payer: Self-pay | Admitting: Internal Medicine

## 2017-10-20 DIAGNOSIS — Z794 Long term (current) use of insulin: Secondary | ICD-10-CM | POA: Diagnosis not present

## 2017-10-20 DIAGNOSIS — E118 Type 2 diabetes mellitus with unspecified complications: Secondary | ICD-10-CM | POA: Diagnosis not present

## 2017-10-20 DIAGNOSIS — Z9049 Acquired absence of other specified parts of digestive tract: Secondary | ICD-10-CM | POA: Diagnosis not present

## 2017-10-20 DIAGNOSIS — E11319 Type 2 diabetes mellitus with unspecified diabetic retinopathy without macular edema: Secondary | ICD-10-CM | POA: Insufficient documentation

## 2017-10-20 DIAGNOSIS — I2510 Atherosclerotic heart disease of native coronary artery without angina pectoris: Secondary | ICD-10-CM | POA: Insufficient documentation

## 2017-10-20 DIAGNOSIS — I10 Essential (primary) hypertension: Secondary | ICD-10-CM | POA: Diagnosis not present

## 2017-10-20 DIAGNOSIS — Z48815 Encounter for surgical aftercare following surgery on the digestive system: Secondary | ICD-10-CM | POA: Diagnosis not present

## 2017-10-20 DIAGNOSIS — Z7982 Long term (current) use of aspirin: Secondary | ICD-10-CM | POA: Insufficient documentation

## 2017-10-20 DIAGNOSIS — R269 Unspecified abnormalities of gait and mobility: Secondary | ICD-10-CM | POA: Insufficient documentation

## 2017-10-20 DIAGNOSIS — H409 Unspecified glaucoma: Secondary | ICD-10-CM | POA: Diagnosis not present

## 2017-10-20 DIAGNOSIS — R809 Proteinuria, unspecified: Secondary | ICD-10-CM | POA: Insufficient documentation

## 2017-10-20 DIAGNOSIS — R2242 Localized swelling, mass and lump, left lower limb: Secondary | ICD-10-CM

## 2017-10-20 DIAGNOSIS — Z79899 Other long term (current) drug therapy: Secondary | ICD-10-CM | POA: Insufficient documentation

## 2017-10-20 DIAGNOSIS — Z9889 Other specified postprocedural states: Secondary | ICD-10-CM | POA: Diagnosis not present

## 2017-10-20 DIAGNOSIS — L84 Corns and callosities: Secondary | ICD-10-CM | POA: Diagnosis not present

## 2017-10-20 DIAGNOSIS — E1142 Type 2 diabetes mellitus with diabetic polyneuropathy: Secondary | ICD-10-CM | POA: Diagnosis not present

## 2017-10-20 DIAGNOSIS — Z955 Presence of coronary angioplasty implant and graft: Secondary | ICD-10-CM | POA: Insufficient documentation

## 2017-10-20 DIAGNOSIS — E785 Hyperlipidemia, unspecified: Secondary | ICD-10-CM | POA: Diagnosis not present

## 2017-10-20 DIAGNOSIS — D649 Anemia, unspecified: Secondary | ICD-10-CM | POA: Diagnosis not present

## 2017-10-20 MED ORDER — GLUCOSE BLOOD VI STRP: ORAL_STRIP | 6 refills | Status: DC

## 2017-10-20 NOTE — Patient Instructions (Signed)
Please call the podiatrist office back to schedule your appointment. Do not forget to take the prescription for the diabetic shoes to any medical supply store to get diabetic shoes.

## 2017-10-20 NOTE — Progress Notes (Signed)
Patient ID: Derek Lowery, male    DOB: [birth year]  MRN: [id number]  CC: Follow-up   Subjective: Derek Lowery is a 62 y.o. male who presents for 1 wk f/u His concerns today include:  62 yr old male with hx of DM type 2 with retinopathy(laser treatments by Dr. Beather Arbour neuropathy, and microalbuminuria,HTN, CAD with stent OM2 in 2012, glaucoma (blind LT eye) and HL  1.  I received a request from home health for shower bench and walker for him. . Has a tub and he is afraid of falling. Ambulates with a cane but feels more stable with walker  2. HTN: on last visit, we d/c Norvasc and restarted Lisinopril/HCTZ at 1/2 dose of what he was on previously -+ swelling in legs during the day but not first thing in a.m -lightheadedness one time since last visit that lasted a few mins.  Drinking mainly water and sometimes diet soda  3.  DM:  Has log. A.m BS 105-157 and before dinner 112-160.  Currently on Lantus 17 units once a day and Humalog 5 units BID  4. Knot on LT thigh x 1 wk Not painful.  Had pinched the area to do an insulin shot but did not give the shot in the thigh anymore   5.  Callous RT foot/periph neuropathy:  Plans to go and get DM shoes today.  He was called for podiatry appt today.  He will listen to message on answering machine when he returns home and call them back to schedule Patient Active Problem List   Diagnosis Date Noted  . Pre-ulcerative corn or callous 10/13/2017  . Anemia 10/13/2017  . Acute gangrenous cholecystitis s/p lap cholecystectomy 09/21/2017 09/19/2017  . Chronic diarrhea 06/09/2017  . Microalbuminuria 03/07/2017  . Dry skin 03/05/2017  . Glaucoma 03/05/2017  . Legally blind 03/05/2017  . Retinopathy due to secondary diabetes ([place]) 02/06/2016  . Diabetic polyneuropathy associated with type 2 diabetes mellitus ([place]) 01/12/2015  . Fournier's gangrene into true pelvis s/p I&D 05/05/2014 05/05/2014  . Poorly controlled type 2 diabetes  mellitus ([place]) 07/17/2011  . HLD (hyperlipidemia) 07/17/2011  . CAD in native artery 07/17/2011  . Essential hypertension 07/17/2011     Current Outpatient Medications on File Prior to Visit  Medication Sig Dispense Refill  . aspirin EC 81 MG tablet Take 1 tablet (81 mg total) by mouth daily. 30 tablet 4  . atorvastatin (LIPITOR) 40 MG tablet TAKE 1 TABLET BY MOUTH EVERY DAY FOR CHOLESTEROL 90 tablet 3  . dorzolamide (TRUSOPT) 2 % ophthalmic solution Place 1 drop into the right eye 2 (two) times daily.    Marland Kitchen glucose monitoring kit (FREESTYLE) monitoring kit 1 each by Does not apply route as needed for other. 1 each 0  . Insulin Glargine (LANTUS SOLOSTAR) 100 UNIT/ML Solostar Pen Inject 17 Units into the skin daily. 15 mL 6  . insulin lispro (HUMALOG KWIKPEN) 100 UNIT/ML KiwkPen Inject 0.05 mLs (5 Units total) into the skin 2 (two) times daily with a meal. Breakfast and supper, and pen needles 3/day 15 mL 11  . Insulin Pen Needle (BD PEN NEEDLE NANO U/F) 32G X 4 MM MISC Use as directed 100 each 5  . lisinopril-hydrochlorothiazide (PRINZIDE,ZESTORETIC) 10-12.5 MG tablet Take 1 tablet by mouth daily. 90 tablet 3  . metoCLOPramide (REGLAN) 10 MG tablet Take 1 tablet (10 mg total) by mouth every 6 (six) hours as needed for nausea. 30 tablet 0  . Multiple Vitamins-Minerals (MULTIVITAMIN WITH MINERALS)  tablet Take 1 tablet by mouth daily.    . timolol (BETIMOL) 0.5 % ophthalmic solution Place 1 drop into the right eye 2 (two) times daily.    . traMADol (ULTRAM) 50 MG tablet Take 1 tablet (50 mg total) by mouth every 6 (six) hours as needed for moderate pain or severe pain. 15 tablet 0  . feeding supplement, GLUCERNA SHAKE, (GLUCERNA SHAKE) LIQD Take 237 mLs by mouth 2 (two) times daily between meals. (Patient not taking: Reported on 10/20/2017) 60 Can 0   Current Facility-Administered Medications on File Prior to Visit  Medication Dose Route Frequency Provider Last Rate Last Dose  . 0.9 %  sodium  chloride infusion  500 mL Intravenous Continuous Gatha Mayer, MD        No Known Allergies  Social History   Socioeconomic History  . Marital status: Single    Spouse name: Not on file  . Number of children: Not on file  . Years of education: Not on file  . Highest education level: Not on file  Social Needs  . Financial resource strain: Not on file  . Food insecurity - worry: Not on file  . Food insecurity - inability: Not on file  . Transportation needs - medical: Not on file  . Transportation needs - non-medical: Not on file  Occupational History  . Occupation: Theatre stage manager: [employer]  Tobacco Use  . Smoking status: Never Smoker  . Smokeless tobacco: Never Used  Substance and Sexual Activity  . Alcohol use: No  . Drug use: No  . Sexual activity: No  Other Topics Concern  . Not on file  Social History Narrative   Works at [employer]    Family History  Problem Relation Age of Onset  . Coronary artery disease Father        Developed in his 8s  . Kidney disease Father   . Diabetes Father   . Melanoma Father   . Rectal cancer Father   . Heart failure Mother   . Hypertension Mother   . Diabetes Sister   . Diabetes Brother   . Colon cancer Neg Hx   . Esophageal cancer Neg Hx   . Stomach cancer Neg Hx     Past Surgical History:  Procedure Laterality Date  . CHOLECYSTECTOMY N/A 09/21/2017   Procedure: LAPAROSCOPIC CHOLECYSTECTOMY WITH INTRAOPERATIVE CHOLANGIOGRAM;  Surgeon: Michael Boston, MD;  Location: [facility];  Service: General;  Laterality: N/A;  . COLONOSCOPY  2000   Dr. Collene Mares  . EYE SURGERY    . IRRIGATION AND DEBRIDEMENT ABSCESS Left 05/05/2014   Procedure: IRRIGATION AND DEBRIDEMENT ABSCESS left buttock;  Surgeon: Michael Boston, MD;  Location: [facility];  Service: General;  Laterality: Left;  . LEFT HEART CATHETERIZATION WITH CORONARY ANGIOGRAM N/A 07/18/2011   Procedure: LEFT HEART CATHETERIZATION WITH CORONARY ANGIOGRAM;   Surgeon: Hillary Bow, MD;  Location: [facility] CATH LAB;  Service: Cardiovascular;  Laterality: N/A;  . PERCUTANEOUS CORONARY STENT INTERVENTION (PCI-S)  07/18/2011   Procedure: PERCUTANEOUS CORONARY STENT INTERVENTION (PCI-S);  Surgeon: Hillary Bow, MD;  Location: [facility] CATH LAB;  Service: Cardiovascular;;    ROS: Review of Systems Negative except as stated above PHYSICAL EXAM: BP 112/66 (BP Location: Left Arm, Patient Position: Sitting, Cuff Size: Normal)   Pulse 62   Temp 97.7 F (36.5 C) (Oral)   Resp 18   Ht 5' 10" (1.778 m)   Wt 207 lb (93.9 kg)  SpO2 99%   BMI 29.70 kg/m   Wt Readings from Last 3 Encounters:  10/20/17 207 lb (93.9 kg)  10/13/17 203 lb 12.8 oz (92.4 kg)  10/01/17 212 lb 15.4 oz (96.6 kg)    Physical Exam General appearance - alert, Caucasian male who looks older than stated age.  Clothing is soiled. Mental status -he answers questions appropriately.   Chest -clear bilaterally. Heart -regular rate and rhythm.   Skin -callus on sole of the right foot is unchanged 2.5x 2 cm firm movable subsuq mass LT upper anterior thigh Ext_trace lower extremity edema MSK: Get up and go test took about 15 seconds.  Low feet to floor clearance.  Ambulates with single cane. Results for orders placed or performed in visit on 10/13/17  Comprehensive metabolic panel  Result Value Ref Range   Glucose 210 (H) 65 - 99 mg/dL   BUN 18 6 - 24 mg/dL   Creatinine, Ser 0.89 0.76 - 1.27 mg/dL   GFR calc non Af Amer 94 >59 mL/min/1.73   GFR calc Af Amer 108 >59 mL/min/1.73   BUN/Creatinine Ratio 20 9 - 20   Sodium 139 134 - 144 mmol/L   Potassium 5.0 3.5 - 5.2 mmol/L   Chloride 101 96 - 106 mmol/L   CO2 27 20 - 29 mmol/L   Calcium 9.1 8.7 - 10.2 mg/dL   Total Protein 6.7 6.0 - 8.5 g/dL   Albumin 3.8 3.5 - 5.5 g/dL   Globulin, Total 2.9 1.5 - 4.5 g/dL   Albumin/Globulin Ratio 1.3 1.2 - 2.2   Bilirubin Total 0.7 0.0 - 1.2 mg/dL   Alkaline Phosphatase 83 39 - 117 IU/L   AST  29 0 - 40 IU/L   ALT 40 0 - 44 IU/L  CBC  Result Value Ref Range   WBC 5.4 3.4 - 10.8 x10E3/uL   RBC 3.97 (L) 4.14 - 5.80 x10E6/uL   Hemoglobin 11.6 (L) 13.0 - 17.7 g/dL   Hematocrit 34.1 (L) 37.5 - 51.0 %   MCV 86 79 - 97 fL   MCH 29.2 26.6 - 33.0 pg   MCHC 34.0 31.5 - 35.7 g/dL   RDW 14.0 12.3 - 15.4 %   Platelets 157 150 - 379 x10E3/uL  Iron, TIBC and Ferritin Panel  Result Value Ref Range   Total Iron Binding Capacity 205 (L) 250 - 450 ug/dL   UIBC 128 111 - 343 ug/dL   Iron 77 38 - 169 ug/dL   Iron Saturation 38 15 - 55 %   Ferritin 375 30 - 400 ng/mL  POCT glucose (manual entry)  Result Value Ref Range   POC Glucose 245 (A) 70 - 99 mg/dl    ASSESSMENT AND PLAN: 1. Controlled type 2 diabetes mellitus with complication, with long-term current use of insulin (HCC) Continue current dose of Lantus and NovoLog. - glucose blood (ACCU-CHEK AVIVA PLUS) test strip; Use as instructed for 3 times daily testing of blood sugar. E11.9  Dispense: 100 each; Refill: 6  2. Pre-ulcerative corn or callous Patient to get diabetic shoes.  He has the prescription that I gave him last week.  Advised him to also return call to podiatry office to schedule appointment  3. Essential hypertension At goal.  Continue lisinopril/HCTZ  4. Mass of left thigh Feels like this may be a cyst.  Advised observation.  Patient to let me know if this increases in size or becomes painful  5. Gait disturbance Form completed for walker with wheels and  transfer bench  Patient was given the opportunity to ask questions.  Patient verbalized understanding of the plan and was able to repeat key elements of the plan.   No orders of the defined types were placed in this encounter.    Requested Prescriptions   Signed Prescriptions Disp Refills  . glucose blood (ACCU-CHEK AVIVA PLUS) test strip 100 each 6    Sig: Use as instructed for 3 times daily testing of blood sugar. E11.9    Return in about 2 months  (around 12/20/2017).  Karle Plumber, MD, FACP

## 2017-10-21 DIAGNOSIS — Z794 Long term (current) use of insulin: Secondary | ICD-10-CM | POA: Diagnosis not present

## 2017-10-21 DIAGNOSIS — Z48815 Encounter for surgical aftercare following surgery on the digestive system: Secondary | ICD-10-CM | POA: Diagnosis not present

## 2017-10-21 DIAGNOSIS — I252 Old myocardial infarction: Secondary | ICD-10-CM | POA: Diagnosis not present

## 2017-10-21 DIAGNOSIS — I2510 Atherosclerotic heart disease of native coronary artery without angina pectoris: Secondary | ICD-10-CM | POA: Diagnosis not present

## 2017-10-21 DIAGNOSIS — I110 Hypertensive heart disease with heart failure: Secondary | ICD-10-CM | POA: Diagnosis not present

## 2017-10-21 DIAGNOSIS — Z955 Presence of coronary angioplasty implant and graft: Secondary | ICD-10-CM | POA: Diagnosis not present

## 2017-10-21 DIAGNOSIS — I5030 Unspecified diastolic (congestive) heart failure: Secondary | ICD-10-CM | POA: Diagnosis not present

## 2017-10-21 DIAGNOSIS — E1165 Type 2 diabetes mellitus with hyperglycemia: Secondary | ICD-10-CM | POA: Diagnosis not present

## 2017-10-21 DIAGNOSIS — R531 Weakness: Secondary | ICD-10-CM | POA: Diagnosis not present

## 2017-10-21 DIAGNOSIS — E11319 Type 2 diabetes mellitus with unspecified diabetic retinopathy without macular edema: Secondary | ICD-10-CM | POA: Diagnosis not present

## 2017-10-21 DIAGNOSIS — E1142 Type 2 diabetes mellitus with diabetic polyneuropathy: Secondary | ICD-10-CM | POA: Diagnosis not present

## 2017-10-21 DIAGNOSIS — R0600 Dyspnea, unspecified: Secondary | ICD-10-CM | POA: Diagnosis not present

## 2017-10-21 DIAGNOSIS — Z7982 Long term (current) use of aspirin: Secondary | ICD-10-CM | POA: Diagnosis not present

## 2017-10-22 DIAGNOSIS — E1165 Type 2 diabetes mellitus with hyperglycemia: Secondary | ICD-10-CM | POA: Diagnosis not present

## 2017-10-22 DIAGNOSIS — Z955 Presence of coronary angioplasty implant and graft: Secondary | ICD-10-CM | POA: Diagnosis not present

## 2017-10-22 DIAGNOSIS — E1142 Type 2 diabetes mellitus with diabetic polyneuropathy: Secondary | ICD-10-CM | POA: Diagnosis not present

## 2017-10-22 DIAGNOSIS — E11319 Type 2 diabetes mellitus with unspecified diabetic retinopathy without macular edema: Secondary | ICD-10-CM | POA: Diagnosis not present

## 2017-10-22 DIAGNOSIS — Z48815 Encounter for surgical aftercare following surgery on the digestive system: Secondary | ICD-10-CM | POA: Diagnosis not present

## 2017-10-22 DIAGNOSIS — Z794 Long term (current) use of insulin: Secondary | ICD-10-CM | POA: Diagnosis not present

## 2017-10-22 DIAGNOSIS — I2510 Atherosclerotic heart disease of native coronary artery without angina pectoris: Secondary | ICD-10-CM | POA: Diagnosis not present

## 2017-10-22 DIAGNOSIS — I252 Old myocardial infarction: Secondary | ICD-10-CM | POA: Diagnosis not present

## 2017-10-22 DIAGNOSIS — I5030 Unspecified diastolic (congestive) heart failure: Secondary | ICD-10-CM | POA: Diagnosis not present

## 2017-10-22 DIAGNOSIS — Z7982 Long term (current) use of aspirin: Secondary | ICD-10-CM | POA: Diagnosis not present

## 2017-10-22 DIAGNOSIS — I110 Hypertensive heart disease with heart failure: Secondary | ICD-10-CM | POA: Diagnosis not present

## 2017-10-26 ENCOUNTER — Telehealth: Payer: Self-pay | Admitting: Internal Medicine

## 2017-10-26 NOTE — Telephone Encounter (Signed)
Received fax from [facility] for order, fax will be in the pcp in-box

## 2017-10-29 ENCOUNTER — Ambulatory Visit: Payer: Medicare Other | Admitting: Podiatry

## 2017-10-30 DIAGNOSIS — I5030 Unspecified diastolic (congestive) heart failure: Secondary | ICD-10-CM | POA: Diagnosis not present

## 2017-10-30 DIAGNOSIS — I2510 Atherosclerotic heart disease of native coronary artery without angina pectoris: Secondary | ICD-10-CM | POA: Diagnosis not present

## 2017-10-30 DIAGNOSIS — E11319 Type 2 diabetes mellitus with unspecified diabetic retinopathy without macular edema: Secondary | ICD-10-CM | POA: Diagnosis not present

## 2017-10-30 DIAGNOSIS — I252 Old myocardial infarction: Secondary | ICD-10-CM | POA: Diagnosis not present

## 2017-10-30 DIAGNOSIS — I110 Hypertensive heart disease with heart failure: Secondary | ICD-10-CM | POA: Diagnosis not present

## 2017-10-30 DIAGNOSIS — Z794 Long term (current) use of insulin: Secondary | ICD-10-CM | POA: Diagnosis not present

## 2017-10-30 DIAGNOSIS — E1142 Type 2 diabetes mellitus with diabetic polyneuropathy: Secondary | ICD-10-CM | POA: Diagnosis not present

## 2017-10-30 DIAGNOSIS — Z7982 Long term (current) use of aspirin: Secondary | ICD-10-CM | POA: Diagnosis not present

## 2017-10-30 DIAGNOSIS — E1165 Type 2 diabetes mellitus with hyperglycemia: Secondary | ICD-10-CM | POA: Diagnosis not present

## 2017-10-30 DIAGNOSIS — Z48815 Encounter for surgical aftercare following surgery on the digestive system: Secondary | ICD-10-CM | POA: Diagnosis not present

## 2017-10-30 DIAGNOSIS — Z955 Presence of coronary angioplasty implant and graft: Secondary | ICD-10-CM | POA: Diagnosis not present

## 2017-11-03 DIAGNOSIS — E113511 Type 2 diabetes mellitus with proliferative diabetic retinopathy with macular edema, right eye: Secondary | ICD-10-CM | POA: Diagnosis not present

## 2017-11-03 DIAGNOSIS — H4050X3 Glaucoma secondary to other eye disorders, unspecified eye, severe stage: Secondary | ICD-10-CM | POA: Diagnosis not present

## 2017-11-03 DIAGNOSIS — H26491 Other secondary cataract, right eye: Secondary | ICD-10-CM | POA: Diagnosis not present

## 2017-11-03 DIAGNOSIS — H35371 Puckering of macula, right eye: Secondary | ICD-10-CM | POA: Diagnosis not present

## 2017-11-03 DIAGNOSIS — H211X1 Other vascular disorders of iris and ciliary body, right eye: Secondary | ICD-10-CM | POA: Diagnosis not present

## 2017-11-04 ENCOUNTER — Encounter: Payer: Self-pay | Admitting: Podiatry

## 2017-11-04 ENCOUNTER — Ambulatory Visit (INDEPENDENT_AMBULATORY_CARE_PROVIDER_SITE_OTHER): Payer: Medicare Other | Admitting: Podiatry

## 2017-11-04 DIAGNOSIS — B351 Tinea unguium: Secondary | ICD-10-CM

## 2017-11-04 DIAGNOSIS — E1142 Type 2 diabetes mellitus with diabetic polyneuropathy: Secondary | ICD-10-CM

## 2017-11-04 DIAGNOSIS — L97511 Non-pressure chronic ulcer of other part of right foot limited to breakdown of skin: Secondary | ICD-10-CM

## 2017-11-04 DIAGNOSIS — M21969 Unspecified acquired deformity of unspecified lower leg: Secondary | ICD-10-CM | POA: Diagnosis not present

## 2017-11-04 NOTE — Patient Instructions (Signed)
Seen for hypertrophic nails, foot lesion and for diabetic shoes. All nails debrided. Pre ulcerative lesion debrided. Both feet measured for diabetic shoes. Return in one months or sooner if needed.

## 2017-11-04 NOTE — Progress Notes (Signed)
SUBJECTIVE: 62 y.o. year old male presents for diabetic foot care. He has a skin lesion on right foot, which has been cared by his sister at home using Betadine dressing. Also in need of diabetic shoes.  Stated that this morning blood sugar was 135. Patient is referred by Dr. Wynetta Emery.  Recent history of hospitalization for Gall bladder surgery.  Review of Systems  Constitutional: Negative.   HENT: Negative.   Eyes:       Glaucoma. Totally blinded on left.  Respiratory: Negative.   Cardiovascular:       Stent put in 2012.   Gastrointestinal: Negative.   Genitourinary: Negative.   Musculoskeletal: Positive for joint pain.       Right knee frequently pops.  Neurological: Positive for tingling, tremors and sensory change.       Feet and hands tingles and tremor.     OBJECTIVE: DERMATOLOGIC EXAMINATION: Nails are thick and hypertrophic x 10. Pre ulcerative and extremely thick plantar callus with intradermal bleeding under the 4th MPJ right foot. No associated edema or erythema noted. Upon removal of outer layer, base of the lesion is dry without open skin.  VASCULAR EXAMINATION OF LOWER LIMBS: Right DP is faintly palpable. Left DP is not palpable. PT is not palpable bilateral. Capillary Filling times within 3 seconds in all digits.  Positive for bilateral pitting edema both feet and ankle.   NEUROLOGIC EXAMINATION OF THE LOWER LIMBS: Failed to respond to Monofilament (Semmes-Weinstein 10-gm) sensory testing bilateral.  MUSCULOSKELETAL EXAMINATION: Plantar flexed 4th Metatarsal right.  ASSESSMENT: Uncontrolled diabetic. Pre ulcerative plantar callus right foot. Onychomycosis x 10. Diabetic neuropathy.  PLAN: Reviewed findings. Plantar lesion and all nails debrided. Home care instruction given to use regular moisturizing cream. Both feet measured for diabetic shoes. Return in one month for follow up on right foot lesion.

## 2017-11-06 DIAGNOSIS — Z955 Presence of coronary angioplasty implant and graft: Secondary | ICD-10-CM | POA: Diagnosis not present

## 2017-11-06 DIAGNOSIS — Z794 Long term (current) use of insulin: Secondary | ICD-10-CM | POA: Diagnosis not present

## 2017-11-06 DIAGNOSIS — E1165 Type 2 diabetes mellitus with hyperglycemia: Secondary | ICD-10-CM | POA: Diagnosis not present

## 2017-11-06 DIAGNOSIS — I5030 Unspecified diastolic (congestive) heart failure: Secondary | ICD-10-CM | POA: Diagnosis not present

## 2017-11-06 DIAGNOSIS — Z48815 Encounter for surgical aftercare following surgery on the digestive system: Secondary | ICD-10-CM | POA: Diagnosis not present

## 2017-11-06 DIAGNOSIS — I110 Hypertensive heart disease with heart failure: Secondary | ICD-10-CM | POA: Diagnosis not present

## 2017-11-06 DIAGNOSIS — Z7982 Long term (current) use of aspirin: Secondary | ICD-10-CM | POA: Diagnosis not present

## 2017-11-06 DIAGNOSIS — I2510 Atherosclerotic heart disease of native coronary artery without angina pectoris: Secondary | ICD-10-CM | POA: Diagnosis not present

## 2017-11-06 DIAGNOSIS — E11319 Type 2 diabetes mellitus with unspecified diabetic retinopathy without macular edema: Secondary | ICD-10-CM | POA: Diagnosis not present

## 2017-11-06 DIAGNOSIS — I252 Old myocardial infarction: Secondary | ICD-10-CM | POA: Diagnosis not present

## 2017-11-06 DIAGNOSIS — E1142 Type 2 diabetes mellitus with diabetic polyneuropathy: Secondary | ICD-10-CM | POA: Diagnosis not present

## 2017-11-17 ENCOUNTER — Telehealth: Payer: Self-pay | Admitting: *Deleted

## 2017-11-17 DIAGNOSIS — H26491 Other secondary cataract, right eye: Secondary | ICD-10-CM | POA: Diagnosis not present

## 2017-11-17 NOTE — Telephone Encounter (Signed)
Receive diabetic shoes today. Patient notified

## 2017-12-02 DIAGNOSIS — H4050X2 Glaucoma secondary to other eye disorders, unspecified eye, moderate stage: Secondary | ICD-10-CM | POA: Diagnosis not present

## 2017-12-02 DIAGNOSIS — Z961 Presence of intraocular lens: Secondary | ICD-10-CM | POA: Diagnosis not present

## 2017-12-02 DIAGNOSIS — E113593 Type 2 diabetes mellitus with proliferative diabetic retinopathy without macular edema, bilateral: Secondary | ICD-10-CM | POA: Diagnosis not present

## 2017-12-02 DIAGNOSIS — H4053X2 Glaucoma secondary to other eye disorders, bilateral, moderate stage: Secondary | ICD-10-CM | POA: Diagnosis not present

## 2017-12-08 ENCOUNTER — Ambulatory Visit: Payer: Medicare Other | Admitting: Podiatry

## 2017-12-09 ENCOUNTER — Encounter: Payer: Self-pay | Admitting: Podiatry

## 2017-12-09 ENCOUNTER — Ambulatory Visit (INDEPENDENT_AMBULATORY_CARE_PROVIDER_SITE_OTHER): Payer: Medicare Other | Admitting: Podiatry

## 2017-12-09 DIAGNOSIS — L97511 Non-pressure chronic ulcer of other part of right foot limited to breakdown of skin: Secondary | ICD-10-CM

## 2017-12-09 DIAGNOSIS — M21969 Unspecified acquired deformity of unspecified lower leg: Secondary | ICD-10-CM

## 2017-12-09 DIAGNOSIS — B351 Tinea unguium: Secondary | ICD-10-CM

## 2017-12-09 DIAGNOSIS — E1142 Type 2 diabetes mellitus with diabetic polyneuropathy: Secondary | ICD-10-CM

## 2017-12-09 NOTE — Patient Instructions (Signed)
Seen for hypertrophic nails and callus under right foot. Noted of excess foot and ankle edema on both feet. All nails debrided. May benefit from Compression socks. Wear daily during the day. Return in 3 months or as needed.

## 2017-12-09 NOTE — Progress Notes (Signed)
Subjective: 62 y.o. year old male patient presents for follow up on diabetic foot care. Stated that Diabetic shoes are really helping. Blood sugar this morning was 127.  Objective: Dermatologic: Thick yellow deformed nails x 10. Pre ulcerative plantar callus under the 4th MPJ area right foot. Vascular: Pedal pulses are not palpable on PT and palpable on DP bilateral. Positive of foot and ankle edema left > right. Orthopedic: Plantar flexed 4th metatarsal right. Neurologic: Subjective numbness and pain under balls of both feet.  Assessment: Dystrophic mycotic nails x 10. Pre ulcerative plantar callus under 4th MPJ area right. Foot and ankle edema bilateral. Diabetic neuropathy.  Treatment: All mycotic nails and plantar calluses debrided.  Return in 3 months or sooner if needed.

## 2017-12-16 DIAGNOSIS — H35371 Puckering of macula, right eye: Secondary | ICD-10-CM | POA: Diagnosis not present

## 2017-12-16 DIAGNOSIS — E113511 Type 2 diabetes mellitus with proliferative diabetic retinopathy with macular edema, right eye: Secondary | ICD-10-CM | POA: Diagnosis not present

## 2017-12-16 DIAGNOSIS — H4720 Unspecified optic atrophy: Secondary | ICD-10-CM | POA: Diagnosis not present

## 2017-12-21 ENCOUNTER — Encounter: Payer: Self-pay | Admitting: Internal Medicine

## 2017-12-21 ENCOUNTER — Ambulatory Visit: Payer: Medicare Other | Attending: Internal Medicine | Admitting: Internal Medicine

## 2017-12-21 VITALS — BP 128/75 | HR 60 | Temp 98.2°F | Resp 16 | Wt 223.0 lb

## 2017-12-21 DIAGNOSIS — Z9049 Acquired absence of other specified parts of digestive tract: Secondary | ICD-10-CM | POA: Diagnosis not present

## 2017-12-21 DIAGNOSIS — Z7982 Long term (current) use of aspirin: Secondary | ICD-10-CM | POA: Diagnosis not present

## 2017-12-21 DIAGNOSIS — Z833 Family history of diabetes mellitus: Secondary | ICD-10-CM | POA: Diagnosis not present

## 2017-12-21 DIAGNOSIS — I2510 Atherosclerotic heart disease of native coronary artery without angina pectoris: Secondary | ICD-10-CM | POA: Diagnosis not present

## 2017-12-21 DIAGNOSIS — Z9889 Other specified postprocedural states: Secondary | ICD-10-CM | POA: Insufficient documentation

## 2017-12-21 DIAGNOSIS — I10 Essential (primary) hypertension: Secondary | ICD-10-CM | POA: Diagnosis not present

## 2017-12-21 DIAGNOSIS — Z794 Long term (current) use of insulin: Secondary | ICD-10-CM | POA: Insufficient documentation

## 2017-12-21 DIAGNOSIS — E785 Hyperlipidemia, unspecified: Secondary | ICD-10-CM | POA: Diagnosis not present

## 2017-12-21 DIAGNOSIS — Z79899 Other long term (current) drug therapy: Secondary | ICD-10-CM | POA: Diagnosis not present

## 2017-12-21 DIAGNOSIS — K529 Noninfective gastroenteritis and colitis, unspecified: Secondary | ICD-10-CM | POA: Insufficient documentation

## 2017-12-21 DIAGNOSIS — E1142 Type 2 diabetes mellitus with diabetic polyneuropathy: Secondary | ICD-10-CM | POA: Diagnosis not present

## 2017-12-21 DIAGNOSIS — R269 Unspecified abnormalities of gait and mobility: Secondary | ICD-10-CM | POA: Diagnosis not present

## 2017-12-21 DIAGNOSIS — Z8249 Family history of ischemic heart disease and other diseases of the circulatory system: Secondary | ICD-10-CM | POA: Diagnosis not present

## 2017-12-21 LAB — POCT GLYCOSYLATED HEMOGLOBIN (HGB A1C): Hemoglobin A1C: 7

## 2017-12-21 LAB — GLUCOSE, POCT (MANUAL RESULT ENTRY): POC Glucose: 161 mg/dl — AB (ref 70–99)

## 2017-12-21 MED ORDER — LANCETS MISC: 11 refills | Status: AC

## 2017-12-21 NOTE — Progress Notes (Signed)
Patient ID: Derek Lowery, male    DOB: 25-Jul-1956  MRN: [id number]  CC: Diabetes   Subjective: Derek Lowery is a 62 y.o. male who presents for chronic ds managment His concerns today include:  hx of DM type 2 with retinopathy(laser treatments by Dr. Beather Arbour neuropathy, and microalbuminuria,HTN, CAD with stent OM2 in 2012, glaucoma (blind LT eye) and HL  Chronic diarrhea:  Not as often as before GB surgery.  Occurs about once a wk.  Thinks it has to do with diet. Takes Imodium about once every 2 wks.  Gait Disturbance:  Did get walker and shower bench.  He finds them very helpful.  He is using his cane today.  He has not had any falls since last visit  Mass on Lt thigh unchanged or a little smaller.   DM: checks BS BID.he reports good range.  Lowest is 92 Got DM shoes and finds them very comfortable Does okay with his eating habits. Compliant with Lantus and Humalog  HTN/CAD:  No CP/SOB.  Some lower extremity edema.  He has purchased some compression socks which helps a lot and keeping swelling down during the day.  Patient Active Problem List   Diagnosis Date Noted  . Pre-ulcerative corn or callous 10/13/2017  . Anemia 10/13/2017  . Acute gangrenous cholecystitis s/p lap cholecystectomy 09/21/2017 09/19/2017  . Chronic diarrhea 06/09/2017  . Microalbuminuria 03/07/2017  . Dry skin 03/05/2017  . Glaucoma 03/05/2017  . Legally blind 03/05/2017  . Retinopathy due to secondary diabetes ([place]) 02/06/2016  . Diabetic polyneuropathy associated with type 2 diabetes mellitus ([place]) 01/12/2015  . Fournier's gangrene into true pelvis s/p I&D 05/05/2014 05/05/2014  . Poorly controlled type 2 diabetes mellitus ([place]) 07/17/2011  . HLD (hyperlipidemia) 07/17/2011  . CAD in native artery 07/17/2011  . Essential hypertension 07/17/2011     Current Outpatient Medications on File Prior to Visit  Medication Sig Dispense Refill  . aspirin EC 81 MG tablet Take 1 tablet (81 mg  total) by mouth daily. 30 tablet 4  . atorvastatin (LIPITOR) 40 MG tablet TAKE 1 TABLET BY MOUTH EVERY DAY FOR CHOLESTEROL 90 tablet 3  . dorzolamide (TRUSOPT) 2 % ophthalmic solution Place 1 drop into the right eye 2 (two) times daily.    . feeding supplement, GLUCERNA SHAKE, (GLUCERNA SHAKE) LIQD Take 237 mLs by mouth 2 (two) times daily between meals. (Patient not taking: Reported on 10/20/2017) 60 Can 0  . glucose blood (ACCU-CHEK AVIVA PLUS) test strip Use as instructed for 3 times daily testing of blood sugar. E11.9 100 each 6  . glucose monitoring kit (FREESTYLE) monitoring kit 1 each by Does not apply route as needed for other. 1 each 0  . Insulin Glargine (LANTUS SOLOSTAR) 100 UNIT/ML Solostar Pen Inject 17 Units into the skin daily. 15 mL 6  . insulin lispro (HUMALOG KWIKPEN) 100 UNIT/ML KiwkPen Inject 0.05 mLs (5 Units total) into the skin 2 (two) times daily with a meal. Breakfast and supper, and pen needles 3/day 15 mL 11  . Insulin Pen Needle (BD PEN NEEDLE NANO U/F) 32G X 4 MM MISC Use as directed 100 each 5  . lisinopril-hydrochlorothiazide (PRINZIDE,ZESTORETIC) 10-12.5 MG tablet Take 1 tablet by mouth daily. 90 tablet 3  . metoCLOPramide (REGLAN) 10 MG tablet Take 1 tablet (10 mg total) by mouth every 6 (six) hours as needed for nausea. 30 tablet 0  . Multiple Vitamins-Minerals (MULTIVITAMIN WITH MINERALS) tablet Take 1 tablet by mouth daily.    Marland Kitchen  timolol (BETIMOL) 0.5 % ophthalmic solution Place 1 drop into the right eye 2 (two) times daily.    . traMADol (ULTRAM) 50 MG tablet Take 1 tablet (50 mg total) by mouth every 6 (six) hours as needed for moderate pain or severe pain. 15 tablet 0   Current Facility-Administered Medications on File Prior to Visit  Medication Dose Route Frequency Provider Last Rate Last Dose  . 0.9 %  sodium chloride infusion  500 mL Intravenous Continuous Gatha Mayer, MD        No Known Allergies  Social History   Socioeconomic History  . Marital  status: Single    Spouse name: Not on file  . Number of children: Not on file  . Years of education: Not on file  . Highest education level: Not on file  Occupational History  . Occupation: Theatre stage manager: [employer]  . Financial resource strain: Not on file  . Food insecurity:    Worry: Not on file    Inability: Not on file  . Transportation needs:    Medical: Not on file    Non-medical: Not on file  Tobacco Use  . Smoking status: Never Smoker  . Smokeless tobacco: Never Used  Substance and Sexual Activity  . Alcohol use: No  . Drug use: No  . Sexual activity: Never  Lifestyle  . Physical activity:    Days per week: Not on file    Minutes per session: Not on file  . Stress: Not on file  Relationships  . Social connections:    Talks on phone: Not on file    Gets together: Not on file    Attends religious service: Not on file    Active member of club or organization: Not on file    Attends meetings of clubs or organizations: Not on file    Relationship status: Not on file  . Intimate partner violence:    Fear of current or ex partner: Not on file    Emotionally abused: Not on file    Physically abused: Not on file    Forced sexual activity: Not on file  Other Topics Concern  . Not on file  Social History Narrative   Works at [employer]    Family History  Problem Relation Age of Onset  . Coronary artery disease Father        Developed in his 29s  . Kidney disease Father   . Diabetes Father   . Melanoma Father   . Rectal cancer Father   . Heart failure Mother   . Hypertension Mother   . Diabetes Sister   . Diabetes Brother   . Colon cancer Neg Hx   . Esophageal cancer Neg Hx   . Stomach cancer Neg Hx     Past Surgical History:  Procedure Laterality Date  . CHOLECYSTECTOMY N/A 09/21/2017   Procedure: LAPAROSCOPIC CHOLECYSTECTOMY WITH INTRAOPERATIVE CHOLANGIOGRAM;  Surgeon: Michael Boston, MD;  Location: [facility];   Service: General;  Laterality: N/A;  . COLONOSCOPY  2000   Dr. Collene Mares  . EYE SURGERY    . IRRIGATION AND DEBRIDEMENT ABSCESS Left 05/05/2014   Procedure: IRRIGATION AND DEBRIDEMENT ABSCESS left buttock;  Surgeon: Michael Boston, MD;  Location: [facility];  Service: General;  Laterality: Left;  . LEFT HEART CATHETERIZATION WITH CORONARY ANGIOGRAM N/A 07/18/2011   Procedure: LEFT HEART CATHETERIZATION WITH CORONARY ANGIOGRAM;  Surgeon: Hillary Bow, MD;  Location: [facility]  CATH LAB;  Service: Cardiovascular;  Laterality: N/A;  . PERCUTANEOUS CORONARY STENT INTERVENTION (PCI-S)  07/18/2011   Procedure: PERCUTANEOUS CORONARY STENT INTERVENTION (PCI-S);  Surgeon: Hillary Bow, MD;  Location: [facility] CATH LAB;  Service: Cardiovascular;;    ROS: Review of Systems Negative except as stated above PHYSICAL EXAM: BP 140/70 BP 128/75   Pulse 60   Temp 98.2 F (36.8 C) (Oral)   Resp 16   Wt 223 lb (101.2 kg)   SpO2 97%   BMI 32.00 kg/m   Physical Exam General appearance - alert, Caucasian male who appears older than stated age.  He is slightly unkept.  Mental status - normal mood, behavior, speech, dress, motor activity, and thought processes Neck - supple, no significant adenopathy Chest - clear to auscultation, no wheezes, rales or rhonchi, symmetric air entry Heart - normal rate, regular rhythm, normal S1, S2, no murmurs, rubs, clicks or gallops Extremities - trace to 1 + edema Diabetic Foot Exam - Simple   Simple Foot Form Visual Inspection No deformities, no ulcerations, no other skin breakdown bilaterally:  Yes Sensation Testing See comments:  Yes Pulse Check Posterior Tibialis and Dorsalis pulse intact bilaterally:  Yes Comments Leap exam abnormal on both feet     Results for orders placed or performed in visit on 12/21/17  POCT glucose (manual entry)  Result Value Ref Range   POC Glucose 161 (A) 70 - 99 mg/dl  POCT glycosylated hemoglobin (Hb A1C)  Result Value Ref Range    Hemoglobin A1C 7.0     ASSESSMENT AND PLAN: 1. Diabetic polyneuropathy associated with type 2 diabetes mellitus (HCC) At goal.  Continue healthy eating habits.  Continue current doses of insulin. - POCT glucose (manual entry) - POCT glycosylated hemoglobin (Hb A1C)  2. Essential hypertension At goal.  Continue current medications. Prescription given for him to get an automated blood pressure cuff to check his blood pressures at home  3. Chronic diarrhea -This is a chronic issue for him but has improved some.  We discussed referral to GI but patient states he is doing fine with just taking Imodium about once every 2 weeks.  4. Gait disturbance Advised to use his walker if he is more steady with that than with a cane   Patient was given the opportunity to ask questions.  Patient verbalized understanding of the plan and was able to repeat key elements of the plan.   Orders Placed This Encounter  Procedures  . POCT glucose (manual entry)  . POCT glycosylated hemoglobin (Hb A1C)     Requested Prescriptions   Signed Prescriptions Disp Refills  . Lancets MISC 100 each 11    Sig: Use as directed.  Accu chek 2    Return in about 3 months (around 03/23/2018).  Karle Plumber, MD, FACP

## 2017-12-21 NOTE — Patient Instructions (Signed)
Check blood pressure twice a week.  The goal is 130/80 or lower.

## 2018-02-10 DIAGNOSIS — H211X1 Other vascular disorders of iris and ciliary body, right eye: Secondary | ICD-10-CM | POA: Diagnosis not present

## 2018-02-10 DIAGNOSIS — E113511 Type 2 diabetes mellitus with proliferative diabetic retinopathy with macular edema, right eye: Secondary | ICD-10-CM | POA: Diagnosis not present

## 2018-03-10 ENCOUNTER — Ambulatory Visit (INDEPENDENT_AMBULATORY_CARE_PROVIDER_SITE_OTHER): Payer: Medicare Other | Admitting: Podiatry

## 2018-03-10 ENCOUNTER — Encounter: Payer: Self-pay | Admitting: Podiatry

## 2018-03-10 DIAGNOSIS — B351 Tinea unguium: Secondary | ICD-10-CM

## 2018-03-10 DIAGNOSIS — E1142 Type 2 diabetes mellitus with diabetic polyneuropathy: Secondary | ICD-10-CM | POA: Diagnosis not present

## 2018-03-10 NOTE — Patient Instructions (Signed)
Seen for hypertrophic nails. All nails debrided. Return in 3 months or as needed.  

## 2018-03-10 NOTE — Progress Notes (Signed)
Subjective: 62 y.o. year old male patient presents complaining of painful nails. Patient requests toe nails trimmed. Feet are feeling much better since wearing diabetic shoes.  Objective: Dermatologic: Thick yellow deformed nails x 10. Ingrown hallucal nail left great toe. Vascular: Pedal pulses are all palpable. Orthopedic: No gross deformities. Neurologic: Diagnosed with Diabetic neuropathy.  Assessment: Dystrophic mycotic nails x 10. Diabetic neuropathy. Ingrown hallucal nail left great toe.  Treatment: All mycotic nails debrided. Bleeding left great toe nail cauterized with Silver Nitrate. Return in 3 months or as needed.

## 2018-03-17 DIAGNOSIS — H211X1 Other vascular disorders of iris and ciliary body, right eye: Secondary | ICD-10-CM | POA: Diagnosis not present

## 2018-03-17 DIAGNOSIS — E113599 Type 2 diabetes mellitus with proliferative diabetic retinopathy without macular edema, unspecified eye: Secondary | ICD-10-CM | POA: Diagnosis not present

## 2018-03-17 DIAGNOSIS — H4050X3 Glaucoma secondary to other eye disorders, unspecified eye, severe stage: Secondary | ICD-10-CM | POA: Diagnosis not present

## 2018-03-17 DIAGNOSIS — E113511 Type 2 diabetes mellitus with proliferative diabetic retinopathy with macular edema, right eye: Secondary | ICD-10-CM | POA: Diagnosis not present

## 2018-03-23 ENCOUNTER — Encounter: Payer: Self-pay | Admitting: Internal Medicine

## 2018-03-23 ENCOUNTER — Ambulatory Visit: Payer: Medicare Other | Attending: Internal Medicine | Admitting: Internal Medicine

## 2018-03-23 VITALS — BP 143/76 | HR 57 | Temp 98.0°F | Resp 16 | Wt 228.4 lb

## 2018-03-23 DIAGNOSIS — Z794 Long term (current) use of insulin: Secondary | ICD-10-CM | POA: Diagnosis not present

## 2018-03-23 DIAGNOSIS — Z79899 Other long term (current) drug therapy: Secondary | ICD-10-CM | POA: Diagnosis not present

## 2018-03-23 DIAGNOSIS — Q809 Congenital ichthyosis, unspecified: Secondary | ICD-10-CM

## 2018-03-23 DIAGNOSIS — H409 Unspecified glaucoma: Secondary | ICD-10-CM | POA: Insufficient documentation

## 2018-03-23 DIAGNOSIS — E1165 Type 2 diabetes mellitus with hyperglycemia: Secondary | ICD-10-CM

## 2018-03-23 DIAGNOSIS — R809 Proteinuria, unspecified: Secondary | ICD-10-CM | POA: Diagnosis not present

## 2018-03-23 DIAGNOSIS — Z9049 Acquired absence of other specified parts of digestive tract: Secondary | ICD-10-CM | POA: Diagnosis not present

## 2018-03-23 DIAGNOSIS — E118 Type 2 diabetes mellitus with unspecified complications: Secondary | ICD-10-CM

## 2018-03-23 DIAGNOSIS — L850 Acquired ichthyosis: Secondary | ICD-10-CM | POA: Diagnosis not present

## 2018-03-23 DIAGNOSIS — E11319 Type 2 diabetes mellitus with unspecified diabetic retinopathy without macular edema: Secondary | ICD-10-CM | POA: Insufficient documentation

## 2018-03-23 DIAGNOSIS — D649 Anemia, unspecified: Secondary | ICD-10-CM | POA: Insufficient documentation

## 2018-03-23 DIAGNOSIS — Z955 Presence of coronary angioplasty implant and graft: Secondary | ICD-10-CM | POA: Insufficient documentation

## 2018-03-23 DIAGNOSIS — Z7982 Long term (current) use of aspirin: Secondary | ICD-10-CM | POA: Diagnosis not present

## 2018-03-23 DIAGNOSIS — E785 Hyperlipidemia, unspecified: Secondary | ICD-10-CM | POA: Insufficient documentation

## 2018-03-23 DIAGNOSIS — I2510 Atherosclerotic heart disease of native coronary artery without angina pectoris: Secondary | ICD-10-CM | POA: Diagnosis not present

## 2018-03-23 DIAGNOSIS — E1142 Type 2 diabetes mellitus with diabetic polyneuropathy: Secondary | ICD-10-CM | POA: Diagnosis not present

## 2018-03-23 DIAGNOSIS — IMO0002 Reserved for concepts with insufficient information to code with codable children: Secondary | ICD-10-CM

## 2018-03-23 DIAGNOSIS — I10 Essential (primary) hypertension: Secondary | ICD-10-CM | POA: Insufficient documentation

## 2018-03-23 LAB — POCT GLYCOSYLATED HEMOGLOBIN (HGB A1C): HBA1C, POC (CONTROLLED DIABETIC RANGE): 7.9 % — AB (ref 0.0–7.0)

## 2018-03-23 LAB — GLUCOSE, POCT (MANUAL RESULT ENTRY): POC GLUCOSE: 135 mg/dL — AB (ref 70–99)

## 2018-03-23 MED ORDER — INSULIN GLARGINE 100 UNIT/ML SOLOSTAR PEN: 19.0000 [IU] | PEN_INJECTOR | Freq: Every day | SUBCUTANEOUS | 6 refills | Status: DC

## 2018-03-23 MED ORDER — INSULIN LISPRO 100 UNIT/ML (KWIKPEN): PEN_INJECTOR | SUBCUTANEOUS | 11 refills | Status: DC

## 2018-03-23 MED ORDER — AMMONIUM LACTATE 12 % EX LOTN: TOPICAL_LOTION | CUTANEOUS | 0 refills | Status: DC

## 2018-03-23 MED ORDER — HYDROCHLOROTHIAZIDE 12.5 MG PO TABS: ORAL_TABLET | ORAL | 5 refills | Status: DC

## 2018-03-23 NOTE — Patient Instructions (Signed)
Increase Lantus to 19 units daily at bedtime. Increase Humalog to 5 units with breakfast and 8 units with dinner.  Please cut back on salt content in the foods. Change to week bread.  Start low-dose hydrochlorothiazide 12.5 mg to take every Monday Wednesday Friday and Saturdays.

## 2018-03-23 NOTE — Progress Notes (Signed)
Patient ID: Derek Lowery, male    DOB: 1955/10/25  MRN: [id number]  CC: Diabetes and Hypertension   Subjective: Derek Lowery is a 62 y.o. male who presents for chronic disease management. His concerns today include:  hx of DM type 2 with retinopathy(laser treatments by Dr. Beather Arbour neuropathy, and microalbuminuria,HTN, CAD with stent OM2 in 2012, glaucoma (blind LT eye) and HL  DM:  Checking BS BID before breakfast and supper.  Does not have log with him.  Gives range of 143-180 before BF and 200-230s before dinner Eating Habits:  Eating a lot more bread and thinks this may be playing a role.  Drinks mainly water and occasional diet sodas JQG:BEEFEOF 5 units with BF and dinner and Lantus 17 units at bedtime -Eye:  Saw Dr. [name] 1 wk ago - had laser surgery.  F/U 04/20/2018  HTN:  Reports compliance with Lis/HCTZ.  No device to check BP. Using using teriyaki sauce daily and his legs.  No CP/SOB.  No LE edema  No falls.  Using cane consistently  Patient Active Problem List   Diagnosis Date Noted  . Pre-ulcerative corn or callous 10/13/2017  . Anemia 10/13/2017  . Acute gangrenous cholecystitis s/p lap cholecystectomy 09/21/2017 09/19/2017  . Chronic diarrhea 06/09/2017  . Microalbuminuria 03/07/2017  . Dry skin 03/05/2017  . Glaucoma 03/05/2017  . Legally blind 03/05/2017  . Retinopathy due to secondary diabetes ([place]) 02/06/2016  . Diabetic polyneuropathy associated with type 2 diabetes mellitus ([place]) 01/12/2015  . Fournier's gangrene into true pelvis s/p I&D 05/05/2014 05/05/2014  . Poorly controlled type 2 diabetes mellitus ([place]) 07/17/2011  . HLD (hyperlipidemia) 07/17/2011  . CAD in native artery 07/17/2011  . Essential hypertension 07/17/2011     Current Outpatient Medications on File Prior to Visit  Medication Sig Dispense Refill  . aspirin EC 81 MG tablet Take 1 tablet (81 mg total) by mouth daily. 30 tablet 4  . atorvastatin (LIPITOR) 40 MG tablet  TAKE 1 TABLET BY MOUTH EVERY DAY FOR CHOLESTEROL 90 tablet 3  . dorzolamide (TRUSOPT) 2 % ophthalmic solution Place 1 drop into the right eye 2 (two) times daily.    . feeding supplement, GLUCERNA SHAKE, (GLUCERNA SHAKE) LIQD Take 237 mLs by mouth 2 (two) times daily between meals. (Patient not taking: Reported on 10/20/2017) 62 Can 0  . glucose blood (ACCU-CHEK AVIVA PLUS) test strip Use as instructed for 3 times daily testing of blood sugar. E11.9 100 each 6  . glucose monitoring kit (FREESTYLE) monitoring kit 1 each by Does not apply route as needed for other. 1 each 0  . Insulin Pen Needle (BD PEN NEEDLE NANO U/F) 32G X 4 MM MISC Use as directed 100 each 5  . Lancets MISC Use as directed.  Accu chek 2 100 each 11  . lisinopril-hydrochlorothiazide (PRINZIDE,ZESTORETIC) 10-12.5 MG tablet Take 1 tablet by mouth daily. 90 tablet 3  . metoCLOPramide (REGLAN) 10 MG tablet Take 1 tablet (10 mg total) by mouth every 6 (six) hours as needed for nausea. 30 tablet 0  . Multiple Vitamins-Minerals (MULTIVITAMIN WITH MINERALS) tablet Take 1 tablet by mouth daily.    . timolol (BETIMOL) 0.5 % ophthalmic solution Place 1 drop into the right eye 2 (two) times daily.    . traMADol (ULTRAM) 50 MG tablet Take 1 tablet (50 mg total) by mouth every 6 (six) hours as needed for moderate pain or severe pain. 15 tablet 0   No current facility-administered medications on file  prior to visit.     No Known Allergies  Social History   Socioeconomic History  . Marital status: Single    Spouse name: Not on file  . Number of children: Not on file  . Years of education: Not on file  . Highest education level: Not on file  Occupational History  . Occupation: Theatre stage manager: [employer]  . Financial resource strain: Not on file  . Food insecurity:    Worry: Not on file    Inability: Not on file  . Transportation needs:    Medical: Not on file    Non-medical: Not on file  Tobacco Use  . Smoking  status: Never Smoker  . Smokeless tobacco: Never Used  Substance and Sexual Activity  . Alcohol use: No  . Drug use: No  . Sexual activity: Never  Lifestyle  . Physical activity:    Days per week: Not on file    Minutes per session: Not on file  . Stress: Not on file  Relationships  . Social connections:    Talks on phone: Not on file    Gets together: Not on file    Attends religious service: Not on file    Active member of club or organization: Not on file    Attends meetings of clubs or organizations: Not on file    Relationship status: Not on file  . Intimate partner violence:    Fear of current or ex partner: Not on file    Emotionally abused: Not on file    Physically abused: Not on file    Forced sexual activity: Not on file  Other Topics Concern  . Not on file  Social History Narrative   Works at [employer]    Family History  Problem Relation Age of Onset  . Coronary artery disease Father        Developed in his 12s  . Kidney disease Father   . Diabetes Father   . Melanoma Father   . Rectal cancer Father   . Heart failure Mother   . Hypertension Mother   . Diabetes Sister   . Diabetes Brother   . Colon cancer Neg Hx   . Esophageal cancer Neg Hx   . Stomach cancer Neg Hx     Past Surgical History:  Procedure Laterality Date  . CHOLECYSTECTOMY N/A 09/21/2017   Procedure: LAPAROSCOPIC CHOLECYSTECTOMY WITH INTRAOPERATIVE CHOLANGIOGRAM;  Surgeon: Michael Boston, MD;  Location: [facility];  Service: General;  Laterality: N/A;  . COLONOSCOPY  2000   Dr. Collene Mares  . EYE SURGERY    . IRRIGATION AND DEBRIDEMENT ABSCESS Left 05/05/2014   Procedure: IRRIGATION AND DEBRIDEMENT ABSCESS left buttock;  Surgeon: Michael Boston, MD;  Location: [facility];  Service: General;  Laterality: Left;  . LEFT HEART CATHETERIZATION WITH CORONARY ANGIOGRAM N/A 07/18/2011   Procedure: LEFT HEART CATHETERIZATION WITH CORONARY ANGIOGRAM;  Surgeon: Hillary Bow, MD;   Location[facility] LAB;  Service: Cardiovascular;  Laterality: N/A;  . PERCUTANEOUS CORONARY STENT INTERVENTION (PCI-S)  07/18/2011   Procedure: PERCUTANEOUS CORONARY STENT INTERVENTION (PCI-S);  Surgeon: Hillary Bow, MD;  Location: [facility] CATH LAB;  Service: Cardiovascular;;    ROS: Review of Systems Negative except as stated above PHYSICAL EXAM: BP (!) 143/76   Pulse (!) 57   Temp 98 F (36.7 C) (Oral)   Resp 16   Wt 228 lb 6.4 oz (103.6 kg)   SpO2 95%  BMI 32.77 kg/m   Wt Readings from Last 3 Encounters:  03/23/18 228 lb 6.4 oz (103.6 kg)  12/21/17 223 lb (101.2 kg)  10/20/17 207 lb (93.9 kg)  BP 138/64  Physical Exam  General appearance - alert, well appearing, and in no distress Mental status - normal mood, behavior, speech, dress, motor activity, and thought processes Neck - supple, no significant adenopathy Chest - clear to auscultation, no wheezes, rales or rhonchi, symmetric air entry Heart - normal rate, regular rhythm, normal S1, S2, no murmurs, rubs, clicks or gallops Extremities - 1 + BL LE edema  Results for orders placed or performed in visit on 03/23/18  POCT glucose (manual entry)  Result Value Ref Range   POC Glucose 135 (A) 70 - 99 mg/dl  POCT glycosylated hemoglobin (Hb A1C)  Result Value Ref Range   Hemoglobin A1C  4.0 - 5.6 %   HbA1c POC (<> result, manual entry)  4.0 - 5.6 %   HbA1c, POC (prediabetic range)  5.7 - 6.4 %   HbA1c, POC (controlled diabetic range) 7.9 (A) 0.0 - 7.0 %     Chemistry      Component Value Date/Time   NA 139 10/13/2017 1140   K 5.0 10/13/2017 1140   CL 101 10/13/2017 1140   CO2 27 10/13/2017 1140   BUN 18 10/13/2017 1140   CREATININE 0.89 10/13/2017 1140   CREATININE 0.80 09/02/2016 0902      Component Value Date/Time   CALCIUM 9.1 10/13/2017 1140   ALKPHOS 83 10/13/2017 1140   AST 29 10/13/2017 1140   ALT 40 10/13/2017 1140   BILITOT 0.7 10/13/2017 1140      ASSESSMENT AND PLAN: 1. Diabetes mellitus  type 2, uncontrolled, with complications (HCC) Not at goal.  Increase Lantus to 19 units and change Humalog to 5 units with breakfast and 8 units with dinner.  He will try changing to wheat bread - POCT glucose (manual entry) - POCT glycosylated hemoglobin (Hb A1C) - Insulin Glargine (LANTUS SOLOSTAR) 100 UNIT/ML Solostar Pen; Inject 19 Units into the skin daily.  Dispense: 15 mL; Refill: 6 - insulin lispro (HUMALOG KWIKPEN) 100 UNIT/ML KiwkPen; 5 units with breakfast and 8 units with dinner subcut  Dispense: 15 mL; Refill: 11  2. Essential hypertension Blood pressure close to goal.  Given his lower extremity edema we will add a low-dose of HCTZ to his lisinopril/HCTZ.  Advised against use of teriyaki sauce.  Encouraged to limit salt in the foods. - hydrochlorothiazide (HYDRODIURIL) 12.5 MG tablet; 1 tab PO Q M/W/F and Sat  Dispense: 30 tablet; Refill: 5  3. Ichthyosis - ammonium lactate (LAC-HYDRIN) 12 % lotion; Apply to lower legs daily for dry skin  Dispense: 400 g; Refill: 0  4. Coronary artery disease involving native coronary artery of native heart without angina pectoris Continue atorvastatin and aspirin.  Pulse rate in the 50s without beta-blocker. - Lipid panel   Patient was given the opportunity to ask questions.  Patient verbalized understanding of the plan and was able to repeat key elements of the plan.   Orders Placed This Encounter  Procedures  . Lipid panel  . POCT glucose (manual entry)  . POCT glycosylated hemoglobin (Hb A1C)     Requested Prescriptions   Signed Prescriptions Disp Refills  . Insulin Glargine (LANTUS SOLOSTAR) 100 UNIT/ML Solostar Pen 15 mL 6    Sig: Inject 19 Units into the skin daily.  . insulin lispro (HUMALOG KWIKPEN) 100 UNIT/ML KiwkPen 15 mL  11    Sig: 5 units with breakfast and 8 units with dinner subcut  . ammonium lactate (LAC-HYDRIN) 12 % lotion 400 g 0    Sig: Apply to lower legs daily for dry skin  . hydrochlorothiazide (HYDRODIURIL)  12.5 MG tablet 30 tablet 5    Sig: 1 tab PO Q M/W/F and Sat    Return in about 3 months (around 06/23/2018).  Karle Plumber, MD, FACP

## 2018-03-24 ENCOUNTER — Telehealth: Payer: Self-pay

## 2018-03-24 LAB — LIPID PANEL
CHOLESTEROL TOTAL: 111 mg/dL (ref 100–199)
Chol/HDL Ratio: 3.7 ratio (ref 0.0–5.0)
HDL: 30 mg/dL — AB (ref >39)
LDL Calculated: 35 mg/dL (ref 0–99)
Triglycerides: 229 mg/dL — ABNORMAL HIGH (ref 0–149)
VLDL CHOLESTEROL CAL: 46 mg/dL — AB (ref 5–40)

## 2018-03-24 NOTE — Telephone Encounter (Signed)
Contacted pt to go over lab results pt is aware of results and doesn't have any questions or concerns 

## 2018-04-07 DIAGNOSIS — Z961 Presence of intraocular lens: Secondary | ICD-10-CM | POA: Diagnosis not present

## 2018-04-07 DIAGNOSIS — E113593 Type 2 diabetes mellitus with proliferative diabetic retinopathy without macular edema, bilateral: Secondary | ICD-10-CM | POA: Diagnosis not present

## 2018-04-07 DIAGNOSIS — H4050X2 Glaucoma secondary to other eye disorders, unspecified eye, moderate stage: Secondary | ICD-10-CM | POA: Diagnosis not present

## 2018-04-08 ENCOUNTER — Other Ambulatory Visit: Payer: Self-pay | Admitting: Internal Medicine

## 2018-04-08 DIAGNOSIS — Z794 Long term (current) use of insulin: Principal | ICD-10-CM

## 2018-04-08 DIAGNOSIS — E118 Type 2 diabetes mellitus with unspecified complications: Principal | ICD-10-CM

## 2018-04-08 DIAGNOSIS — IMO0002 Reserved for concepts with insufficient information to code with codable children: Secondary | ICD-10-CM

## 2018-04-08 DIAGNOSIS — E1165 Type 2 diabetes mellitus with hyperglycemia: Secondary | ICD-10-CM

## 2018-04-11 ENCOUNTER — Emergency Department (HOSPITAL_COMMUNITY)
Admission: EM | Admit: 2018-04-11 | Discharge: 2018-04-11 | Disposition: A | Discharge status: Home / Self Care | Payer: Medicare Other | Attending: Emergency Medicine | Admitting: Emergency Medicine

## 2018-04-11 ENCOUNTER — Encounter (HOSPITAL_COMMUNITY): Payer: Self-pay | Admitting: Emergency Medicine

## 2018-04-11 DIAGNOSIS — Z7982 Long term (current) use of aspirin: Secondary | ICD-10-CM | POA: Insufficient documentation

## 2018-04-11 DIAGNOSIS — I10 Essential (primary) hypertension: Secondary | ICD-10-CM | POA: Diagnosis not present

## 2018-04-11 DIAGNOSIS — E119 Type 2 diabetes mellitus without complications: Secondary | ICD-10-CM | POA: Diagnosis not present

## 2018-04-11 DIAGNOSIS — Z955 Presence of coronary angioplasty implant and graft: Secondary | ICD-10-CM | POA: Insufficient documentation

## 2018-04-11 DIAGNOSIS — Z794 Long term (current) use of insulin: Secondary | ICD-10-CM | POA: Insufficient documentation

## 2018-04-11 DIAGNOSIS — I2510 Atherosclerotic heart disease of native coronary artery without angina pectoris: Secondary | ICD-10-CM | POA: Diagnosis not present

## 2018-04-11 DIAGNOSIS — Z79899 Other long term (current) drug therapy: Secondary | ICD-10-CM | POA: Insufficient documentation

## 2018-04-11 DIAGNOSIS — K5900 Constipation, unspecified: Secondary | ICD-10-CM | POA: Diagnosis not present

## 2018-04-11 LAB — COMPREHENSIVE METABOLIC PANEL
ALBUMIN: 4.6 g/dL (ref 3.5–5.0)
ALK PHOS: 62 U/L (ref 38–126)
ALT: 21 U/L (ref 0–44)
ANION GAP: 12 (ref 5–15)
AST: 27 U/L (ref 15–41)
BILIRUBIN TOTAL: 1.6 mg/dL — AB (ref 0.3–1.2)
BUN: 19 mg/dL (ref 6–20)
CALCIUM: 9.8 mg/dL (ref 8.9–10.3)
CO2: 25 mmol/L (ref 22–32)
Chloride: 105 mmol/L (ref 98–111)
Creatinine, Ser: 0.91 mg/dL (ref 0.61–1.24)
GLUCOSE: 163 mg/dL — AB (ref 70–99)
Potassium: 3.8 mmol/L (ref 3.5–5.1)
Sodium: 142 mmol/L (ref 135–145)
TOTAL PROTEIN: 8 g/dL (ref 6.5–8.1)

## 2018-04-11 LAB — CBC
HCT: 40.3 % (ref 39.0–52.0)
Hemoglobin: 13.8 g/dL (ref 13.0–17.0)
MCH: 29 pg (ref 26.0–34.0)
MCHC: 34.2 g/dL (ref 30.0–36.0)
MCV: 84.7 fL (ref 78.0–100.0)
Platelets: 171 10*3/uL (ref 150–400)
RBC: 4.76 MIL/uL (ref 4.22–5.81)
RDW: 13.5 % (ref 11.5–15.5)
WBC: 10.6 10*3/uL — AB (ref 4.0–10.5)

## 2018-04-11 LAB — LIPASE, BLOOD: Lipase: 24 U/L (ref 11–51)

## 2018-04-11 LAB — CBG MONITORING, ED: GLUCOSE-CAPILLARY: 156 mg/dL — AB (ref 70–99)

## 2018-04-11 NOTE — ED Triage Notes (Signed)
Patient here from home with complaints of constipation and abdominal pain. States that he took Metamucil and now has lose stool leaking around. Also took antidiarrhea this morning.

## 2018-04-11 NOTE — Discharge Instructions (Addendum)
Use Colace to help keep stools soft.  You may also use glycerin suppositories if needed. Follow-up with your family doctor as needed, return to ER for worsening or concerning symptoms.

## 2018-04-11 NOTE — ED Provider Notes (Signed)
[facility] DEPT Provider Note   CSN: [id number] Arrival date & time: 04/11/18  1937     History   Chief Complaint Chief Complaint  Patient presents with  . Abdominal Pain    HPI Derek Lowery is a 62 y.o. male.  62 year old male presents with complaint of constipation and diarrhea.  Patient states that he has not had any stool output for the past 2 weeks, took Metamucil yesterday and is now having loose stools around what he feels is a larger stool that he is unable to pass.  Patient denies abdominal pain, nausea, vomiting, fevers, chills, changes in bladder habits.  No other complaints or concerns.     Past Medical History:  Diagnosis Date  . Arthritis   . CAD (coronary artery disease)    NSTEMI 06/2011:  LHC 07/18/11: Proximal diagonal 60%, distal LAD with a diabetic appearance and 60% stenosis, OM2 with an occluded superior branch and an inferior branch with 90%, EF 55% with inferior hypokinesis.  PCI: Promus DES to the OM2 inferior branch.  This vessel provides collaterals to the superior branch which remained occluded.  Echocardiogram 07/18/11: EF 60%, normal wall motion.  . Cataract   . Essential hypertension, benign   . Glaucoma   . Hypercholesteremia   . Internal hemorrhoids   . Myocardial infarction ([place])   . Noncompliance   . Type 2 diabetes mellitus ([place]) 1990    Patient Active Problem List   Diagnosis Date Noted  . Pre-ulcerative corn or callous 10/13/2017  . Anemia 10/13/2017  . Acute gangrenous cholecystitis s/p lap cholecystectomy 09/21/2017 09/19/2017  . Chronic diarrhea 06/09/2017  . Microalbuminuria 03/07/2017  . Dry skin 03/05/2017  . Glaucoma 03/05/2017  . Legally blind 03/05/2017  . Retinopathy due to secondary diabetes ([place]) 02/06/2016  . Diabetic polyneuropathy associated with type 2 diabetes mellitus ([place]) 01/12/2015  . Fournier's gangrene into true pelvis s/p I&D 05/05/2014 05/05/2014  . Poorly controlled type  2 diabetes mellitus ([place]) 07/17/2011  . HLD (hyperlipidemia) 07/17/2011  . CAD in native artery 07/17/2011  . Essential hypertension 07/17/2011    Past Surgical History:  Procedure Laterality Date  . CHOLECYSTECTOMY N/A 09/21/2017   Procedure: LAPAROSCOPIC CHOLECYSTECTOMY WITH INTRAOPERATIVE CHOLANGIOGRAM;  Surgeon: Michael Boston, MD;  Location: [facility];  Service: General;  Laterality: N/A;  . COLONOSCOPY  2000   Dr. Collene Mares  . EYE SURGERY    . IRRIGATION AND DEBRIDEMENT ABSCESS Left 05/05/2014   Procedure: IRRIGATION AND DEBRIDEMENT ABSCESS left buttock;  Surgeon: Michael Boston, MD;  Location: [facility];  Service: General;  Laterality: Left;  . LEFT HEART CATHETERIZATION WITH CORONARY ANGIOGRAM N/A 07/18/2011   Procedure: LEFT HEART CATHETERIZATION WITH CORONARY ANGIOGRAM;  Surgeon: Hillary Bow, MD;  Location: [facility] CATH LAB;  Service: Cardiovascular;  Laterality: N/A;  . PERCUTANEOUS CORONARY STENT INTERVENTION (PCI-S)  07/18/2011   Procedure: PERCUTANEOUS CORONARY STENT INTERVENTION (PCI-S);  Surgeon: Hillary Bow, MD;  Location: [facility] CATH LAB;  Service: Cardiovascular;;        Home Medications    Prior to Admission medications   Medication Sig Start Date End Date Taking? Authorizing Provider  ammonium lactate (LAC-HYDRIN) 12 % lotion Apply to lower legs daily for dry skin 03/23/18   Ladell Pier, MD  aspirin EC 81 MG tablet Take 1 tablet (81 mg total) by mouth daily. 03/25/16   Langeland, Dawn T, MD  atorvastatin (LIPITOR) 40 MG tablet TAKE 1 TABLET BY MOUTH EVERY DAY FOR CHOLESTEROL 03/05/17  Ladell Pier, MD  dorzolamide (TRUSOPT) 2 % ophthalmic solution Place 1 drop into the right eye 2 (two) times daily.    [provider]  feeding supplement, GLUCERNA SHAKE, (GLUCERNA SHAKE) LIQD Take 237 mLs by mouth 2 (two) times daily between meals. Patient not taking: Reported on 10/20/2017 10/01/17   Jonetta Osgood, MD  glucose blood (ACCU-CHEK AVIVA PLUS) test strip Use as  instructed for 3 times daily testing of blood sugar. E11.9 10/20/17   Ladell Pier, MD  glucose monitoring kit (FREESTYLE) monitoring kit 1 each by Does not apply route as needed for other. 05/10/14   Modena Jansky, MD  hydrochlorothiazide (HYDRODIURIL) 12.5 MG tablet 1 tab PO Q M/W/F and Sat 03/23/18   Ladell Pier, MD  Insulin Glargine (LANTUS SOLOSTAR) 100 UNIT/ML Solostar Pen Inject 19 Units into the skin daily. 03/23/18   Ladell Pier, MD  insulin lispro (HUMALOG KWIKPEN) 100 UNIT/ML KiwkPen 5 units with breakfast and 8 units with dinner subcut 03/23/18   Ladell Pier, MD  Insulin Pen Needle (BD PEN NEEDLE NANO U/F) 32G X 4 MM MISC Use as directed 03/05/17   Ladell Pier, MD  Lancets MISC Use as directed.  Accu chek 2 12/21/17   Ladell Pier, MD  lisinopril-hydrochlorothiazide (PRINZIDE,ZESTORETIC) 10-12.5 MG tablet TAKE 1 TABLET BY MOUTH EVERY DAY 04/08/18   Ladell Pier, MD  metoCLOPramide (REGLAN) 10 MG tablet Take 1 tablet (10 mg total) by mouth every 6 (six) hours as needed for nausea. 10/01/17 10/01/18  Ghimire, Henreitta Leber, MD  Multiple Vitamins-Minerals (MULTIVITAMIN WITH MINERALS) tablet Take 1 tablet by mouth daily.    [provider]  timolol (BETIMOL) 0.5 % ophthalmic solution Place 1 drop into the right eye 2 (two) times daily.    [provider]  traMADol (ULTRAM) 50 MG tablet Take 1 tablet (50 mg total) by mouth every 6 (six) hours as needed for moderate pain or severe pain. 10/01/17   Ghimire, Henreitta Leber, MD    Family History Family History  Problem Relation Age of Onset  . Coronary artery disease Father        Developed in his 33s  . Kidney disease Father   . Diabetes Father   . Melanoma Father   . Rectal cancer Father   . Heart failure Mother   . Hypertension Mother   . Diabetes Sister   . Diabetes Brother   . Colon cancer Neg Hx   . Esophageal cancer Neg Hx   . Stomach cancer Neg Hx     Social History Social History    Tobacco Use  . Smoking status: Never Smoker  . Smokeless tobacco: Never Used  Substance Use Topics  . Alcohol use: No  . Drug use: No     Allergies   Patient has no known allergies.   Review of Systems Review of Systems  Constitutional: Negative for chills and fever.  Gastrointestinal: Positive for constipation and diarrhea. Negative for abdominal distention, abdominal pain, blood in stool, nausea and vomiting.  Genitourinary: Negative for difficulty urinating.  Musculoskeletal: Negative for back pain.  Skin: Negative for rash and wound.  Allergic/Immunologic: Positive for immunocompromised state.  Neurological: Negative for weakness.  Psychiatric/Behavioral: Negative for confusion.  All other systems reviewed and are negative.    Physical Exam Updated Vital Signs BP (!) 153/78   Pulse 77   Temp 98.2 F (36.8 C) (Oral)   Resp 18   SpO2 100%  Physical Exam  Constitutional: He is oriented to person, place, and time. He appears well-developed and well-nourished. No distress.  HENT:  Head: Normocephalic and atraumatic.  Cardiovascular: Normal rate, regular rhythm, normal heart sounds and intact distal pulses.  Pulmonary/Chest: Effort normal and breath sounds normal.  Abdominal: Normal appearance and bowel sounds are normal. There is no tenderness.  Genitourinary: Rectal exam shows no mass and no tenderness.  Genitourinary Comments: RN assisted with rectal exam, patient disimpacted without difficulty, tolerated well, stool is brown, soft  Neurological: He is alert and oriented to person, place, and time.  Skin: Skin is warm and dry. He is not diaphoretic.  Psychiatric: He has a normal mood and affect. His behavior is normal.  Nursing note and vitals reviewed.    ED Treatments / Results  Labs (all labs ordered are listed, but only abnormal results are displayed) Labs Reviewed  COMPREHENSIVE METABOLIC PANEL - Abnormal; Notable for the following components:       Result Value   Glucose, Bld 163 (*)    Total Bilirubin 1.6 (*)    All other components within normal limits  CBC - Abnormal; Notable for the following components:   WBC 10.6 (*)    All other components within normal limits  CBG MONITORING, ED - Abnormal; Notable for the following components:   Glucose-Capillary 156 (*)    All other components within normal limits  LIPASE, BLOOD  URINALYSIS, ROUTINE W REFLEX MICROSCOPIC    EKG None  Radiology No results found.  Procedures Procedures (including critical care time)  Medications Ordered in ED Medications - No data to display   Initial Impression / Assessment and Plan / ED Course  I have reviewed the triage vital signs and the nursing notes.  Pertinent labs & imaging results that were available during my care of the patient were reviewed by me and considered in my medical decision making (see chart for details).  Clinical Course as of Apr 11 1108  Sun Apr 11, 6762  [id number] 62 year old male presents with complaint of constipation, took Metamucil and developed stools leaking around what he felt was a large stool he was unable to pass.  Patient denied abdominal pain, vomiting, fevers or any other complaints or concerns.  On exam his abdomen is soft and nontender, rectal exam revealed a soft ball of stool in the rectal vault, patient was disimpacted and he subsequently went to the bathroom and passed additional stools.  Patient feels well and is ready for discharge home.  His lab work shows a normal lipase, CMP has a mildly elevated glucose of 163 and a bilirubin of 1.6, CBC with mild leukocytosis at 10.6.  Patient to follow-up with PCP, return to ER for worsening concerning symptoms.   [LM]    Clinical Course User Index [LM] Tacy Learn, PA-C    Final Clinical Impressions(s) / ED Diagnoses   Final diagnoses:  Constipation, unspecified constipation type    ED Discharge Orders    None       Tacy Learn, PA-C 04/11/18  1109    Maudie Flakes, MD 04/11/18 1401

## 2018-04-11 NOTE — ED Notes (Signed)
Pt just had small bowel movement. Unable to attain urine sample.

## 2018-04-21 DIAGNOSIS — H35371 Puckering of macula, right eye: Secondary | ICD-10-CM | POA: Diagnosis not present

## 2018-04-21 DIAGNOSIS — H211X1 Other vascular disorders of iris and ciliary body, right eye: Secondary | ICD-10-CM | POA: Diagnosis not present

## 2018-04-21 DIAGNOSIS — H4050X3 Glaucoma secondary to other eye disorders, unspecified eye, severe stage: Secondary | ICD-10-CM | POA: Diagnosis not present

## 2018-04-21 DIAGNOSIS — E113511 Type 2 diabetes mellitus with proliferative diabetic retinopathy with macular edema, right eye: Secondary | ICD-10-CM | POA: Diagnosis not present

## 2018-04-21 DIAGNOSIS — E113599 Type 2 diabetes mellitus with proliferative diabetic retinopathy without macular edema, unspecified eye: Secondary | ICD-10-CM | POA: Diagnosis not present

## 2018-05-17 ENCOUNTER — Telehealth: Payer: Self-pay | Admitting: Internal Medicine

## 2018-05-17 DIAGNOSIS — IMO0002 Reserved for concepts with insufficient information to code with codable children: Secondary | ICD-10-CM

## 2018-05-17 DIAGNOSIS — E1165 Type 2 diabetes mellitus with hyperglycemia: Secondary | ICD-10-CM

## 2018-05-17 DIAGNOSIS — Z794 Long term (current) use of insulin: Principal | ICD-10-CM

## 2018-05-17 DIAGNOSIS — E118 Type 2 diabetes mellitus with unspecified complications: Principal | ICD-10-CM

## 2018-05-17 DIAGNOSIS — E785 Hyperlipidemia, unspecified: Secondary | ICD-10-CM

## 2018-05-17 MED ORDER — ATORVASTATIN CALCIUM 40 MG PO TABS: ORAL_TABLET | ORAL | 2 refills | Status: DC

## 2018-05-17 MED ORDER — LISINOPRIL-HYDROCHLOROTHIAZIDE 10-12.5 MG PO TABS: 1.0000 | ORAL_TABLET | Freq: Every day | ORAL | 2 refills | Status: DC

## 2018-05-17 NOTE — Telephone Encounter (Signed)
[facility] confirms [facility] have lisinopril-HCTZ 10-12.22m tabs in stock currently. I will send refills now.

## 2018-05-17 NOTE — Telephone Encounter (Signed)
[facility] called to request a medication refill on -atorvastatin (LIPITOR) 40 MG tablet -lisinopril-hydrochlorothiazide (PRINZIDE,ZESTORETIC) 10-12.5 MG tablet  Caller states [facility] does not carry the combination pill for lisinopril and hydrochlorothiazide, please follow up to [id number] [place], [place]

## 2018-05-19 DIAGNOSIS — Z961 Presence of intraocular lens: Secondary | ICD-10-CM | POA: Diagnosis not present

## 2018-05-19 DIAGNOSIS — E113593 Type 2 diabetes mellitus with proliferative diabetic retinopathy without macular edema, bilateral: Secondary | ICD-10-CM | POA: Diagnosis not present

## 2018-05-19 DIAGNOSIS — H4050X2 Glaucoma secondary to other eye disorders, unspecified eye, moderate stage: Secondary | ICD-10-CM | POA: Diagnosis not present

## 2018-05-26 DIAGNOSIS — E113511 Type 2 diabetes mellitus with proliferative diabetic retinopathy with macular edema, right eye: Secondary | ICD-10-CM | POA: Diagnosis not present

## 2018-05-26 DIAGNOSIS — H43811 Vitreous degeneration, right eye: Secondary | ICD-10-CM | POA: Diagnosis not present

## 2018-05-26 DIAGNOSIS — H4720 Unspecified optic atrophy: Secondary | ICD-10-CM | POA: Diagnosis not present

## 2018-05-26 DIAGNOSIS — H35371 Puckering of macula, right eye: Secondary | ICD-10-CM | POA: Diagnosis not present

## 2018-06-10 ENCOUNTER — Encounter: Payer: Self-pay | Admitting: Podiatry

## 2018-06-10 ENCOUNTER — Ambulatory Visit (INDEPENDENT_AMBULATORY_CARE_PROVIDER_SITE_OTHER): Payer: Medicare Other | Admitting: Podiatry

## 2018-06-10 DIAGNOSIS — E1142 Type 2 diabetes mellitus with diabetic polyneuropathy: Secondary | ICD-10-CM

## 2018-06-10 DIAGNOSIS — B351 Tinea unguium: Secondary | ICD-10-CM

## 2018-06-10 NOTE — Progress Notes (Signed)
Subjective: 62 y.o. year old male patient presents complaining of painful nails. Patient requests toe nails trimmed. Feet are feeling much better since wearing diabetic shoes.  Objective: Dermatologic: Thick yellow deformed nails x 10. Ingrown hallucal nail left great toe. Vascular: Pedal pulses are all palpable. Orthopedic: No gross deformities. Neurologic: Diagnosed with Diabetic neuropathy.  Assessment: Dystrophic mycotic nails x 10. Diabetic neuropathy. Ingrown hallucal nail left great toe.  Treatment: All mycotic nails debrided.

## 2018-06-10 NOTE — Patient Instructions (Signed)
Seen for hypertrophic nails. All nails debrided. Return in 3 months or as needed.  

## 2018-06-24 ENCOUNTER — Encounter: Payer: Self-pay | Admitting: Internal Medicine

## 2018-06-24 ENCOUNTER — Ambulatory Visit: Payer: Medicare Other | Attending: Internal Medicine | Admitting: Internal Medicine

## 2018-06-24 VITALS — BP 159/78 | HR 65 | Temp 97.9°F | Resp 16 | Wt 236.2 lb

## 2018-06-24 DIAGNOSIS — E11319 Type 2 diabetes mellitus with unspecified diabetic retinopathy without macular edema: Secondary | ICD-10-CM | POA: Insufficient documentation

## 2018-06-24 DIAGNOSIS — E118 Type 2 diabetes mellitus with unspecified complications: Secondary | ICD-10-CM

## 2018-06-24 DIAGNOSIS — E1142 Type 2 diabetes mellitus with diabetic polyneuropathy: Secondary | ICD-10-CM | POA: Insufficient documentation

## 2018-06-24 DIAGNOSIS — Z955 Presence of coronary angioplasty implant and graft: Secondary | ICD-10-CM | POA: Insufficient documentation

## 2018-06-24 DIAGNOSIS — Z794 Long term (current) use of insulin: Secondary | ICD-10-CM

## 2018-06-24 DIAGNOSIS — IMO0002 Reserved for concepts with insufficient information to code with codable children: Secondary | ICD-10-CM

## 2018-06-24 DIAGNOSIS — Z9889 Other specified postprocedural states: Secondary | ICD-10-CM | POA: Insufficient documentation

## 2018-06-24 DIAGNOSIS — Z23 Encounter for immunization: Secondary | ICD-10-CM | POA: Insufficient documentation

## 2018-06-24 DIAGNOSIS — Z8249 Family history of ischemic heart disease and other diseases of the circulatory system: Secondary | ICD-10-CM | POA: Diagnosis not present

## 2018-06-24 DIAGNOSIS — Z9049 Acquired absence of other specified parts of digestive tract: Secondary | ICD-10-CM | POA: Diagnosis not present

## 2018-06-24 DIAGNOSIS — E785 Hyperlipidemia, unspecified: Secondary | ICD-10-CM | POA: Insufficient documentation

## 2018-06-24 DIAGNOSIS — E1165 Type 2 diabetes mellitus with hyperglycemia: Secondary | ICD-10-CM | POA: Insufficient documentation

## 2018-06-24 DIAGNOSIS — Z833 Family history of diabetes mellitus: Secondary | ICD-10-CM | POA: Diagnosis not present

## 2018-06-24 DIAGNOSIS — R600 Localized edema: Secondary | ICD-10-CM

## 2018-06-24 DIAGNOSIS — I10 Essential (primary) hypertension: Secondary | ICD-10-CM | POA: Diagnosis not present

## 2018-06-24 DIAGNOSIS — H8110 Benign paroxysmal vertigo, unspecified ear: Secondary | ICD-10-CM | POA: Insufficient documentation

## 2018-06-24 DIAGNOSIS — Z79899 Other long term (current) drug therapy: Secondary | ICD-10-CM | POA: Insufficient documentation

## 2018-06-24 DIAGNOSIS — Z7982 Long term (current) use of aspirin: Secondary | ICD-10-CM | POA: Insufficient documentation

## 2018-06-24 DIAGNOSIS — I2510 Atherosclerotic heart disease of native coronary artery without angina pectoris: Secondary | ICD-10-CM | POA: Insufficient documentation

## 2018-06-24 DIAGNOSIS — I25118 Atherosclerotic heart disease of native coronary artery with other forms of angina pectoris: Secondary | ICD-10-CM

## 2018-06-24 LAB — POCT GLYCOSYLATED HEMOGLOBIN (HGB A1C): HBA1C, POC (CONTROLLED DIABETIC RANGE): 9.5 % — AB (ref 0.0–7.0)

## 2018-06-24 LAB — GLUCOSE, POCT (MANUAL RESULT ENTRY): POC Glucose: 327 mg/dl — AB (ref 70–99)

## 2018-06-24 MED ORDER — INSULIN GLARGINE 100 UNIT/ML SOLOSTAR PEN: 23.0000 [IU] | PEN_INJECTOR | Freq: Every day | SUBCUTANEOUS | 6 refills | Status: DC

## 2018-06-24 MED ORDER — INSULIN LISPRO (1 UNIT DIAL) 100 UNIT/ML (KWIKPEN): PEN_INJECTOR | SUBCUTANEOUS | 11 refills | Status: DC

## 2018-06-24 MED ORDER — FREESTYLE SYSTEM KIT: 1.0000 | PACK | 0 refills | Status: DC | PRN

## 2018-06-24 NOTE — Progress Notes (Signed)
Patient ID: Derek Lowery, male    DOB: [birth year]  MRN: [id number]  CC: Diabetes and Hypertension   Subjective: Derek Lowery is a 62 y.o. male who presents for chronic ds management.  His sister is with him. His concerns today include:  hx of DM type 2 with retinopathy(laser treatments by Dr. Beather Arbour neuropathy, and microalbuminuria,HTN, CAD with stent OM2 in 2012, glaucoma (blind LT eye) and HL  DM:   "My sugars have been up. Eating out more than I should.  Eating fast food."  Sister does not cook all the time and pt reports that his vision is too poor to try to do his own cooking. He thinks his insurance has some program where they will send pre-package foods if he requests it. -compliant with Lantus 19 units daily and Humalog 5 units with BF/8 units with dinner.  Sometimes he has to take 8 units of Humalog in the a.m because morning BS have been high. -meter gave out 2 wks ago but prior to it giving out, a.m BS as in the 200s.  Drinks mainly water.   HTN:  Not taking the extra HCTZ 12.5 mg as prescribed on last visit.  Made him urinate too much +LE edema.  SOB, CP if he has to walk up stairs. Recently visited a relative and had to walk up 3 flights of stairs to get to this person's apartment.  Also c/o "some swimming head" for the past 4-6 wks. He notices it only when he sits up in bed, rolls over in bed and when he leans head back to place eye drops in eyes.  It lasts for several seconds.  He has not had any falls.  Patient Active Problem List   Diagnosis Date Noted  . Pre-ulcerative corn or callous 10/13/2017  . Anemia 10/13/2017  . Acute gangrenous cholecystitis s/p lap cholecystectomy 09/21/2017 09/19/2017  . Chronic diarrhea 06/09/2017  . Microalbuminuria 03/07/2017  . Dry skin 03/05/2017  . Glaucoma 03/05/2017  . Legally blind 03/05/2017  . Retinopathy due to secondary diabetes ([place]) 02/06/2016  . Diabetic polyneuropathy associated with type 2 diabetes  mellitus ([place]) 01/12/2015  . Fournier's gangrene into true pelvis s/p I&D 05/05/2014 05/05/2014  . Poorly controlled type 2 diabetes mellitus ([place]) 07/17/2011  . HLD (hyperlipidemia) 07/17/2011  . CAD in native artery 07/17/2011  . Essential hypertension 07/17/2011     Current Outpatient Medications on File Prior to Visit  Medication Sig Dispense Refill  . ammonium lactate (LAC-HYDRIN) 12 % lotion Apply to lower legs daily for dry skin 400 g 0  . aspirin EC 81 MG tablet Take 1 tablet (81 mg total) by mouth daily. 30 tablet 4  . atorvastatin (LIPITOR) 40 MG tablet TAKE 1 TABLET BY MOUTH EVERY DAY FOR CHOLESTEROL 30 tablet 2  . dorzolamide (TRUSOPT) 2 % ophthalmic solution Place 1 drop into the right eye 2 (two) times daily.    . feeding supplement, GLUCERNA SHAKE, (GLUCERNA SHAKE) LIQD Take 237 mLs by mouth 2 (two) times daily between meals. (Patient not taking: Reported on 10/20/2017) 60 Can 0  . glucose blood (ACCU-CHEK AVIVA PLUS) test strip Use as instructed for 3 times daily testing of blood sugar. E11.9 100 each 6  . glucose monitoring kit (FREESTYLE) monitoring kit 1 each by Does not apply route as needed for other. 1 each 0  . hydrochlorothiazide (HYDRODIURIL) 12.5 MG tablet 1 tab PO Q M/W/F and Sat 30 tablet 5  . Insulin Glargine (LANTUS SOLOSTAR)  100 UNIT/ML Solostar Pen Inject 19 Units into the skin daily. 15 mL 6  . insulin lispro (HUMALOG KWIKPEN) 100 UNIT/ML KiwkPen 5 units with breakfast and 8 units with dinner subcut 15 mL 11  . Insulin Pen Needle (BD PEN NEEDLE NANO U/F) 32G X 4 MM MISC Use as directed 100 each 5  . Lancets MISC Use as directed.  Accu chek 2 100 each 11  . lisinopril-hydrochlorothiazide (PRINZIDE,ZESTORETIC) 10-12.5 MG tablet Take 1 tablet by mouth daily. 30 tablet 2  . metoCLOPramide (REGLAN) 10 MG tablet Take 1 tablet (10 mg total) by mouth every 6 (six) hours as needed for nausea. 30 tablet 0  . Multiple Vitamins-Minerals (MULTIVITAMIN WITH MINERALS) tablet  Take 1 tablet by mouth daily.    . timolol (BETIMOL) 0.5 % ophthalmic solution Place 1 drop into the right eye 2 (two) times daily.    . traMADol (ULTRAM) 50 MG tablet Take 1 tablet (50 mg total) by mouth every 6 (six) hours as needed for moderate pain or severe pain. 15 tablet 0   No current facility-administered medications on file prior to visit.     No Known Allergies  Social History   Socioeconomic History  . Marital status: Single    Spouse name: Not on file  . Number of children: Not on file  . Years of education: Not on file  . Highest education level: Not on file  Occupational History  . Occupation: Theatre stage manager: [employer]  . Financial resource strain: Not on file  . Food insecurity:    Worry: Not on file    Inability: Not on file  . Transportation needs:    Medical: Not on file    Non-medical: Not on file  Tobacco Use  . Smoking status: Never Smoker  . Smokeless tobacco: Never Used  Substance and Sexual Activity  . Alcohol use: No  . Drug use: No  . Sexual activity: Never  Lifestyle  . Physical activity:    Days per week: Not on file    Minutes per session: Not on file  . Stress: Not on file  Relationships  . Social connections:    Talks on phone: Not on file    Gets together: Not on file    Attends religious service: Not on file    Active member of club or organization: Not on file    Attends meetings of clubs or organizations: Not on file    Relationship status: Not on file  . Intimate partner violence:    Fear of current or ex partner: Not on file    Emotionally abused: Not on file    Physically abused: Not on file    Forced sexual activity: Not on file  Other Topics Concern  . Not on file  Social History Narrative   Works at [employer]    Family History  Problem Relation Age of Onset  . Coronary artery disease Father        Developed in his 32s  . Kidney disease Father   . Diabetes Father   .  Melanoma Father   . Rectal cancer Father   . Heart failure Mother   . Hypertension Mother   . Diabetes Sister   . Diabetes Brother   . Colon cancer Neg Hx   . Esophageal cancer Neg Hx   . Stomach cancer Neg Hx     Past Surgical History:  Procedure Laterality Date  .  CHOLECYSTECTOMY N/A 09/21/2017   Procedure: LAPAROSCOPIC CHOLECYSTECTOMY WITH INTRAOPERATIVE CHOLANGIOGRAM;  Surgeon: Michael Boston, MD;  Location: [facility];  Service: General;  Laterality: N/A;  . COLONOSCOPY  2000   Dr. Collene Mares  . EYE SURGERY    . IRRIGATION AND DEBRIDEMENT ABSCESS Left 05/05/2014   Procedure: IRRIGATION AND DEBRIDEMENT ABSCESS left buttock;  Surgeon: Michael Boston, MD;  Location: [facility];  Service: General;  Laterality: Left;  . LEFT HEART CATHETERIZATION WITH CORONARY ANGIOGRAM N/A 07/18/2011   Procedure: LEFT HEART CATHETERIZATION WITH CORONARY ANGIOGRAM;  Surgeon: Hillary Bow, MD;  Location: [facility] CATH LAB;  Service: Cardiovascular;  Laterality: N/A;  . PERCUTANEOUS CORONARY STENT INTERVENTION (PCI-S)  07/18/2011   Procedure: PERCUTANEOUS CORONARY STENT INTERVENTION (PCI-S);  Surgeon: Hillary Bow, MD;  Location: [facility] CATH LAB;  Service: Cardiovascular;;    ROS: Review of Systems Negative except as stated above. PHYSICAL EXAM: BP (!) 159/78   Pulse 65   Temp 97.9 F (36.6 C) (Oral)   Resp 16   Wt 236 lb 3.2 oz (107.1 kg)   SpO2 98%   BMI 33.89 kg/m   Wt Readings from Last 3 Encounters:  06/24/18 236 lb 3.2 oz (107.1 kg)  03/23/18 228 lb 6.4 oz (103.6 kg)  12/21/17 223 lb (101.2 kg)   Repeat blood pressure BP 128/80 Physical Exam General appearance - alert, well appearing, and in no distress Mental status - normal mood, behavior, speech, dress, motor activity, and thought processes Eyes -nonicteric sclera.  Pink conjunctiva. Mouth - mucous membranes moist, pharynx normal without lesions Neck - supple, no significant adenopathy Chest - clear to auscultation, no wheezes, rales or rhonchi,  symmetric air entry Heart - normal rate, regular rhythm, normal S1, S2, no murmurs, rubs, clicks or gallops Extremities -trace to 1+ edema in the lower one third of the extremities including the ankles Neuro:  CN intact except for visual impairment.  Power in upper extremities 5/5 bilaterally.  Power in lower extremities 4+/5 bilaterally Diabetic Foot Exam - Simple   Simple Foot Form Visual Inspection No deformities, no ulcerations, no other skin breakdown bilaterally:  Yes Sensation Testing Intact to touch and monofilament testing bilaterally:  Yes Pulse Check Posterior Tibialis and Dorsalis pulse intact bilaterally:  Yes Comments     Results for orders placed or performed in visit on 06/24/18  POCT glucose (manual entry)  Result Value Ref Range   POC Glucose 327 (A) 70 - 99 mg/dl  POCT glycosylated hemoglobin (Hb A1C)  Result Value Ref Range   Hemoglobin A1C     HbA1c POC (<> result, manual entry)     HbA1c, POC (prediabetic range)     HbA1c, POC (controlled diabetic range) 9.5 (A) 0.0 - 7.0 %   Depression screen [facility] 2/9 03/23/2018 10/20/2017 10/13/2017  Decreased Interest 1 1 1   Down, Depressed, Hopeless 0 0 0  PHQ - 2 Score 1 1 1   Altered sleeping - 0 0  Tired, decreased energy - 1 1  Change in appetite - 1 0  Feeling bad or failure about yourself  - 0 0  Trouble concentrating - 0 0  Moving slowly or fidgety/restless - 0 0  Suicidal thoughts - 0 0  PHQ-9 Score - 3 2    ASSESSMENT AND PLAN: 1. Uncontrolled type 2 diabetes mellitus with complication, with long-term current use of insulin ([place]) Dietary counseling given.  Advised to cut out eating fast foods.  We discussed getting Meals on Wheels for him.  Patient states  he will check with his insurance about the program that they offer for and sending prepackaged foods to him.  If this does not work out he will let me know so that we can try Meals on Wheels -Increase Humalog to 8 units with meals.  Increase Lantus to 23 units.   Prescription sent to his pharmacy for new glucometer - POCT glucose (manual entry) - POCT glycosylated hemoglobin (Hb A1C) - Microalbumin / creatinine urine ratio - Insulin Glargine (LANTUS SOLOSTAR) 100 UNIT/ML Solostar Pen; Inject 23 Units into the skin daily.  Dispense: 15 mL; Refill: 6 - insulin lispro (HUMALOG KWIKPEN) 100 UNIT/ML KwikPen; 8 units TID with meals  Dispense: 15 mL; Refill: 11 - glucose monitoring kit (FREESTYLE) monitoring kit; 1 each by Does not apply route as needed for other.  Dispense: 1 each; Refill: 0  2. Essential hypertension At goal based on repeat blood pressure reading today.  He does have some lower extremity edema.  Advised cutting back on eating salty foods.  Advised to use the extra HCTZ 12.5 mg as needed when he noticed increase edema - Basic Metabolic Panel  3. Edema of both legs See #2 above  4. Coronary artery disease of native artery of native heart with stable angina pectoris (HCC) Continue statin, aspirin and lisinopril HCTZ.  If any blood pressure issues in the future, we will add beta-blocker.  5. Benign paroxysmal positional vertigo, unspecified laterality History is consistent with BPV.  Recommend referral to physical therapy for vestibular training but patient declined stating that it is not too bothersome to him.  If it starts occurring more often he will consider.  6. Need for immunization against influenza - Flu Vaccine QUAD 36+ mos IM     Patient was given the opportunity to ask questions.  Patient verbalized understanding of the plan and was able to repeat key elements of the plan.   Orders Placed This Encounter  Procedures  . Microalbumin / creatinine urine ratio  . POCT glucose (manual entry)  . POCT glycosylated hemoglobin (Hb A1C)     Requested Prescriptions    No prescriptions requested or ordered in this encounter    No follow-ups on file.  Karle Plumber, MD, FACP

## 2018-06-24 NOTE — Patient Instructions (Addendum)
Your blood sugar is not controlled.  Increase Lantus to 23 units daily.  Increase Humalog to 8 units 3 times daily with meals.  Continue to monitor your blood sugars.  I have sent a prescription to your pharmacy for new glucometer.  Use the HCTZ as needed for a few days whenever you noticed increased swelling in the legs.  Continue to limit salt in the foods.   Benign Positional Vertigo Vertigo is the feeling that you or your surroundings are moving when they are not. Benign positional vertigo is the most common form of vertigo. The cause of this condition is not serious (is benign). This condition is triggered by certain movements and positions (is positional). This condition can be dangerous if it occurs while you are doing something that could endanger you or others, such as driving. What are the causes? In many cases, the cause of this condition is not known. It may be caused by a disturbance in an area of the inner ear that helps your brain to sense movement and balance. This disturbance can be caused by a viral infection (labyrinthitis), head injury, or repetitive motion. What increases the risk? This condition is more likely to develop in:  Women.  People who are 30 years of age or older.  What are the signs or symptoms? Symptoms of this condition usually happen when you move your head or your eyes in different directions. Symptoms may start suddenly, and they usually last for less than a minute. Symptoms may include:  Loss of balance and falling.  Feeling like you are spinning or moving.  Feeling like your surroundings are spinning or moving.  Nausea and vomiting.  Blurred vision.  Dizziness.  Involuntary eye movement (nystagmus).  Symptoms can be mild and cause only slight annoyance, or they can be severe and interfere with daily life. Episodes of benign positional vertigo may return (recur) over time, and they may be triggered by certain movements. Symptoms may improve over  time. How is this diagnosed? This condition is usually diagnosed by medical history and a physical exam of the head, neck, and ears. You may be referred to a health care provider who specializes in ear, nose, and throat (ENT) problems (otolaryngologist) or a provider who specializes in disorders of the nervous system (neurologist). You may have additional testing, including:  MRI.  A CT scan.  Eye movement tests. Your health care provider may ask you to change positions quickly while he or she watches you for symptoms of benign positional vertigo, such as nystagmus. Eye movement may be tested with an electronystagmogram (ENG), caloric stimulation, the Dix-Hallpike test, or the roll test.  An electroencephalogram (EEG). This records electrical activity in your brain.  Hearing tests.  How is this treated? Usually, your health care provider will treat this by moving your head in specific positions to adjust your inner ear back to normal. Surgery may be needed in severe cases, but this is rare. In some cases, benign positional vertigo may resolve on its own in 2-4 weeks. Follow these instructions at home: Safety  Move slowly.Avoid sudden body or head movements.  Avoid driving.  Avoid operating heavy machinery.  Avoid doing any tasks that would be dangerous to you or others if a vertigo episode would occur.  If you have trouble walking or keeping your balance, try using a cane for stability. If you feel dizzy or unstable, sit down right away.  Return to your normal activities as told by your health care provider.  Ask your health care provider what activities are safe for you. General instructions  Take over-the-counter and prescription medicines only as told by your health care provider.  Avoid certain positions or movements as told by your health care provider.  Drink enough fluid to keep your urine clear or pale yellow.  Keep all follow-up visits as told by your health care  provider. This is important. Contact a health care provider if:  You have a fever.  Your condition gets worse or you develop new symptoms.  Your family or friends notice any behavioral changes.  Your nausea or vomiting gets worse.  You have numbness or a "pins and needles" sensation. Get help right away if:  You have difficulty speaking or moving.  You are always dizzy.  You faint.  You develop severe headaches.  You have weakness in your legs or arms.  You have changes in your hearing or vision.  You develop a stiff neck.  You develop sensitivity to light. This information is not intended to replace advice given to you by your health care provider. Make sure you discuss any questions you have with your health care provider. Document Released: 05/12/2006 Document Revised: 01/10/2016 Document Reviewed: 11/27/2014 Elsevier Interactive Patient Education  2018 [employer].  Influenza Virus Vaccine injection (Fluarix) What is this medicine? INFLUENZA VIRUS VACCINE (in floo EN zuh VAHY ruhs vak SEEN) helps to reduce the risk of getting influenza also known as the flu. This medicine may be used for other purposes; ask your health care provider or pharmacist if you have questions. COMMON BRAND NAME(S): Fluarix, Fluzone What should I tell my health care provider before I take this medicine? They need to know if you have any of these conditions: -bleeding disorder like hemophilia -fever or infection -Guillain-Barre syndrome or other neurological problems -immune system problems -infection with the human immunodeficiency virus (HIV) or AIDS -low blood platelet counts -multiple sclerosis -an unusual or allergic reaction to influenza virus vaccine, eggs, chicken proteins, latex, gentamicin, other medicines, foods, dyes or preservatives -pregnant or trying to get pregnant -breast-feeding How should I use this medicine? This vaccine is for injection into a muscle. It is given by  a health care professional. A copy of Vaccine Information Statements will be given before each vaccination. Read this sheet carefully each time. The sheet may change frequently. Talk to your pediatrician regarding the use of this medicine in children. Special care may be needed. Overdosage: If you think you have taken too much of this medicine contact a poison control center or emergency room at once. NOTE: This medicine is only for you. Do not share this medicine with others. What if I miss a dose? This does not apply. What may interact with this medicine? -chemotherapy or radiation therapy -medicines that lower your immune system like etanercept, anakinra, infliximab, and adalimumab -medicines that treat or prevent blood clots like warfarin -phenytoin -steroid medicines like prednisone or cortisone -theophylline -vaccines This list may not describe all possible interactions. Give your health care provider a list of all the medicines, herbs, non-prescription drugs, or dietary supplements you use. Also tell them if you smoke, drink alcohol, or use illegal drugs. Some items may interact with your medicine. What should I watch for while using this medicine? Report any side effects that do not go away within 3 days to your doctor or health care professional. Call your health care provider if any unusual symptoms occur within 6 weeks of receiving this vaccine. You may still catch  the flu, but the illness is not usually as bad. You cannot get the flu from the vaccine. The vaccine will not protect against colds or other illnesses that may cause fever. The vaccine is needed every year. What side effects may I notice from receiving this medicine? Side effects that you should report to your doctor or health care professional as soon as possible: -allergic reactions like skin rash, itching or hives, swelling of the face, lips, or tongue Side effects that usually do not require medical attention (report  to your doctor or health care professional if they continue or are bothersome): -fever -headache -muscle aches and pains -pain, tenderness, redness, or swelling at site where injected -weak or tired This list may not describe all possible side effects. Call your doctor for medical advice about side effects. You may report side effects to FDA at 1-800-FDA-1088. Where should I keep my medicine? This vaccine is only given in a clinic, pharmacy, doctor's office, or other health care setting and will not be stored at home. NOTE: This sheet is a summary. It may not cover all possible information. If you have questions about this medicine, talk to your doctor, pharmacist, or health care provider.  2018 Elsevier/Gold Standard (2008-03-01 09:30:40)

## 2018-06-24 NOTE — Progress Notes (Signed)
Pt states for the past couple of weeks when he gets out of bed he feels dizzy and lightheaded

## 2018-06-25 ENCOUNTER — Telehealth: Payer: Self-pay

## 2018-06-25 DIAGNOSIS — H8110 Benign paroxysmal vertigo, unspecified ear: Secondary | ICD-10-CM | POA: Insufficient documentation

## 2018-06-25 LAB — BASIC METABOLIC PANEL
BUN/Creatinine Ratio: 17 (ref 10–24)
BUN: 20 mg/dL (ref 8–27)
CHLORIDE: 99 mmol/L (ref 96–106)
CO2: 25 mmol/L (ref 20–29)
CREATININE: 1.21 mg/dL (ref 0.76–1.27)
Calcium: 9.4 mg/dL (ref 8.6–10.2)
GFR, EST AFRICAN AMERICAN: 75 mL/min/{1.73_m2} (ref >59)
GFR, EST NON AFRICAN AMERICAN: 65 mL/min/{1.73_m2} (ref >59)
Glucose: 306 mg/dL — ABNORMAL HIGH (ref 65–99)
Potassium: 4.5 mmol/L (ref 3.5–5.2)
Sodium: 137 mmol/L (ref 134–144)

## 2018-06-25 LAB — MICROALBUMIN / CREATININE URINE RATIO
Creatinine, Urine: 32.4 mg/dL
Microalb/Creat Ratio: 190.4 mg/g creat — ABNORMAL HIGH (ref 0.0–30.0)
Microalbumin, Urine: 61.7 ug/mL

## 2018-06-25 NOTE — Telephone Encounter (Signed)
Contacted pt to go over lab results pt is aware and doesn't have any questions or concerns 

## 2018-07-01 DIAGNOSIS — H43811 Vitreous degeneration, right eye: Secondary | ICD-10-CM | POA: Diagnosis not present

## 2018-07-01 DIAGNOSIS — H4050X3 Glaucoma secondary to other eye disorders, unspecified eye, severe stage: Secondary | ICD-10-CM | POA: Diagnosis not present

## 2018-07-01 DIAGNOSIS — E113511 Type 2 diabetes mellitus with proliferative diabetic retinopathy with macular edema, right eye: Secondary | ICD-10-CM | POA: Diagnosis not present

## 2018-07-02 ENCOUNTER — Telehealth: Payer: Self-pay

## 2018-07-02 NOTE — Telephone Encounter (Signed)
Contacted pt to go over lab results pt is aware and doesn't have any questions or concerns 

## 2018-07-19 ENCOUNTER — Other Ambulatory Visit: Payer: Self-pay | Admitting: Internal Medicine

## 2018-07-19 DIAGNOSIS — Z794 Long term (current) use of insulin: Principal | ICD-10-CM

## 2018-07-19 DIAGNOSIS — IMO0002 Reserved for concepts with insufficient information to code with codable children: Secondary | ICD-10-CM

## 2018-07-19 DIAGNOSIS — E1165 Type 2 diabetes mellitus with hyperglycemia: Secondary | ICD-10-CM

## 2018-07-19 DIAGNOSIS — E118 Type 2 diabetes mellitus with unspecified complications: Principal | ICD-10-CM

## 2018-07-21 ENCOUNTER — Other Ambulatory Visit: Payer: Self-pay

## 2018-07-21 NOTE — Patient Outreach (Signed)
[facility]) Care Management  07/21/2018  ELWARD NOCERA November 17, 1955 [id number]   Medication Adherence call to Mr. Mani Celestin spoke with patient he is due for Lisinopril / HCTZ 10/12.5 mg patient said he has been taking 1/2 of tablet from February to September and now he is taking 1 tablet daily per doctors new instructions,patient has plenty at this time. Mr. Brier is showing past due under [place].   [place] Management Direct Dial [phone]  Fax [phone] [email or web address]

## 2018-08-05 DIAGNOSIS — H3561 Retinal hemorrhage, right eye: Secondary | ICD-10-CM | POA: Diagnosis not present

## 2018-08-05 DIAGNOSIS — H211X1 Other vascular disorders of iris and ciliary body, right eye: Secondary | ICD-10-CM | POA: Diagnosis not present

## 2018-08-05 DIAGNOSIS — E113511 Type 2 diabetes mellitus with proliferative diabetic retinopathy with macular edema, right eye: Secondary | ICD-10-CM | POA: Diagnosis not present

## 2018-08-05 DIAGNOSIS — E113599 Type 2 diabetes mellitus with proliferative diabetic retinopathy without macular edema, unspecified eye: Secondary | ICD-10-CM | POA: Diagnosis not present

## 2018-08-05 DIAGNOSIS — H35371 Puckering of macula, right eye: Secondary | ICD-10-CM | POA: Diagnosis not present

## 2018-08-10 ENCOUNTER — Other Ambulatory Visit: Payer: Self-pay | Admitting: Internal Medicine

## 2018-08-10 DIAGNOSIS — E785 Hyperlipidemia, unspecified: Secondary | ICD-10-CM

## 2018-09-02 ENCOUNTER — Ambulatory Visit: Payer: Medicare Other | Admitting: Internal Medicine

## 2018-09-16 DIAGNOSIS — H4720 Unspecified optic atrophy: Secondary | ICD-10-CM | POA: Diagnosis not present

## 2018-09-16 DIAGNOSIS — E113511 Type 2 diabetes mellitus with proliferative diabetic retinopathy with macular edema, right eye: Secondary | ICD-10-CM | POA: Diagnosis not present

## 2018-09-16 DIAGNOSIS — H43811 Vitreous degeneration, right eye: Secondary | ICD-10-CM | POA: Diagnosis not present

## 2018-09-16 DIAGNOSIS — H35371 Puckering of macula, right eye: Secondary | ICD-10-CM | POA: Diagnosis not present

## 2018-09-17 ENCOUNTER — Other Ambulatory Visit: Payer: Self-pay | Admitting: Internal Medicine

## 2018-09-17 DIAGNOSIS — I10 Essential (primary) hypertension: Secondary | ICD-10-CM

## 2018-09-24 ENCOUNTER — Encounter: Payer: Self-pay | Admitting: Internal Medicine

## 2018-09-24 ENCOUNTER — Ambulatory Visit: Payer: Medicare Other | Attending: Internal Medicine | Admitting: Internal Medicine

## 2018-09-24 VITALS — BP 134/70 | HR 60 | Temp 97.5°F | Ht 70.0 in | Wt 239.4 lb

## 2018-09-24 DIAGNOSIS — E1142 Type 2 diabetes mellitus with diabetic polyneuropathy: Secondary | ICD-10-CM | POA: Diagnosis not present

## 2018-09-24 DIAGNOSIS — E1129 Type 2 diabetes mellitus with other diabetic kidney complication: Secondary | ICD-10-CM

## 2018-09-24 DIAGNOSIS — Z79899 Other long term (current) drug therapy: Secondary | ICD-10-CM | POA: Insufficient documentation

## 2018-09-24 DIAGNOSIS — I2510 Atherosclerotic heart disease of native coronary artery without angina pectoris: Secondary | ICD-10-CM | POA: Insufficient documentation

## 2018-09-24 DIAGNOSIS — R809 Proteinuria, unspecified: Secondary | ICD-10-CM | POA: Diagnosis not present

## 2018-09-24 DIAGNOSIS — Z833 Family history of diabetes mellitus: Secondary | ICD-10-CM | POA: Insufficient documentation

## 2018-09-24 DIAGNOSIS — H409 Unspecified glaucoma: Secondary | ICD-10-CM | POA: Insufficient documentation

## 2018-09-24 DIAGNOSIS — E11319 Type 2 diabetes mellitus with unspecified diabetic retinopathy without macular edema: Secondary | ICD-10-CM | POA: Insufficient documentation

## 2018-09-24 DIAGNOSIS — D649 Anemia, unspecified: Secondary | ICD-10-CM | POA: Insufficient documentation

## 2018-09-24 DIAGNOSIS — Z955 Presence of coronary angioplasty implant and graft: Secondary | ICD-10-CM | POA: Insufficient documentation

## 2018-09-24 DIAGNOSIS — E785 Hyperlipidemia, unspecified: Secondary | ICD-10-CM | POA: Insufficient documentation

## 2018-09-24 DIAGNOSIS — E1165 Type 2 diabetes mellitus with hyperglycemia: Secondary | ICD-10-CM | POA: Diagnosis not present

## 2018-09-24 DIAGNOSIS — IMO0002 Reserved for concepts with insufficient information to code with codable children: Secondary | ICD-10-CM

## 2018-09-24 DIAGNOSIS — L600 Ingrowing nail: Secondary | ICD-10-CM | POA: Insufficient documentation

## 2018-09-24 DIAGNOSIS — Z794 Long term (current) use of insulin: Secondary | ICD-10-CM | POA: Insufficient documentation

## 2018-09-24 DIAGNOSIS — Z8249 Family history of ischemic heart disease and other diseases of the circulatory system: Secondary | ICD-10-CM | POA: Insufficient documentation

## 2018-09-24 DIAGNOSIS — Z800 Family history of malignant neoplasm of digestive organs: Secondary | ICD-10-CM | POA: Insufficient documentation

## 2018-09-24 DIAGNOSIS — Z7982 Long term (current) use of aspirin: Secondary | ICD-10-CM | POA: Insufficient documentation

## 2018-09-24 DIAGNOSIS — R600 Localized edema: Secondary | ICD-10-CM

## 2018-09-24 DIAGNOSIS — I10 Essential (primary) hypertension: Secondary | ICD-10-CM | POA: Insufficient documentation

## 2018-09-24 DIAGNOSIS — L602 Onychogryphosis: Secondary | ICD-10-CM

## 2018-09-24 DIAGNOSIS — Z9049 Acquired absence of other specified parts of digestive tract: Secondary | ICD-10-CM | POA: Insufficient documentation

## 2018-09-24 LAB — POCT GLYCOSYLATED HEMOGLOBIN (HGB A1C): HbA1c, POC (controlled diabetic range): 8.2 % — AB (ref 0.0–7.0)

## 2018-09-24 LAB — GLUCOSE, POCT (MANUAL RESULT ENTRY): POC Glucose: 134 mg/dl — AB (ref 70–99)

## 2018-09-24 MED ORDER — BLOOD GLUCOSE MONITOR KIT: PACK | 0 refills | Status: DC

## 2018-09-24 NOTE — Progress Notes (Signed)
Patient ID: Derek Lowery, male    DOB: 1956-03-02  MRN: [id number]  CC: Follow-up   Subjective: Derek Lowery is a 63 y.o. male who presents for chronic ds management His concerns today include:  hx of DM type 2 with retinopathy(laser treatments by Dr. Beather Arbour neuropathy, and microalbuminuria,HTN, CAD with stent OM2 in 2012, glaucoma (blind LT eye) and HL  DM: not checking BS. Lancet device broke about 1 mth ago.  Taking Lantus 24 units and Humalog 8 units TID with meals Eating habits:  Not eating out as much as before.  Sister cooking more.  Eating more frozen veggies for snacks Eye: had inj to RT eye by Dr. [name] last wk.  Will see Dr. Schuyler Amor next wk Does not check feet regularly.  Wears DM shoes and compression socks.  Needs to find a new podiatrist.    HTN/Hx of CAD:  Reports compliance with Lis/HCTZ.  Takes the extra HCTZ every other day.  Still having some swelling in the lower extremities.  He denies any chest pains.  Some shortness of breath if he goes upstairs. Request referral  to cardiology for f/u  Patient Active Problem List   Diagnosis Date Noted  . Benign paroxysmal positional vertigo 06/25/2018  . Pre-ulcerative corn or callous 10/13/2017  . Anemia 10/13/2017  . Acute gangrenous cholecystitis s/p lap cholecystectomy 09/21/2017 09/19/2017  . Chronic diarrhea 06/09/2017  . Microalbuminuria 03/07/2017  . Dry skin 03/05/2017  . Glaucoma 03/05/2017  . Legally blind 03/05/2017  . Retinopathy due to secondary diabetes ([place]) 02/06/2016  . Diabetic polyneuropathy associated with type 2 diabetes mellitus ([place]) 01/12/2015  . Fournier's gangrene into true pelvis s/p I&D 05/05/2014 05/05/2014  . Poorly controlled type 2 diabetes mellitus ([place]) 07/17/2011  . HLD (hyperlipidemia) 07/17/2011  . CAD in native artery 07/17/2011  . Essential hypertension 07/17/2011     Current Outpatient Medications on File Prior to Visit  Medication Sig Dispense Refill  .  aspirin EC 81 MG tablet Take 1 tablet (81 mg total) by mouth daily. 30 tablet 4  . atorvastatin (LIPITOR) 40 MG tablet TAKE 1 TABLET BY MOUTH EVERY DAY FOR CHOLESTEROL 90 tablet 0  . dorzolamide (TRUSOPT) 2 % ophthalmic solution Place 1 drop into the right eye 2 (two) times daily.    Marland Kitchen glucose blood (ACCU-CHEK AVIVA PLUS) test strip Use as instructed for 3 times daily testing of blood sugar. E11.9 100 each 6  . glucose monitoring kit (FREESTYLE) monitoring kit 1 each by Does not apply route as needed for other. 1 each 0  . hydrochlorothiazide (HYDRODIURIL) 12.5 MG tablet TAKE 1 TABLET BY MOUTH ON M/W/F AND SAT 30 tablet 0  . Insulin Glargine (LANTUS SOLOSTAR) 100 UNIT/ML Solostar Pen Inject 23 Units into the skin daily. 15 mL 6  . insulin lispro (HUMALOG KWIKPEN) 100 UNIT/ML KwikPen 8 units TID with meals 15 mL 11  . Insulin Pen Needle (BD PEN NEEDLE NANO U/F) 32G X 4 MM MISC Use as directed 100 each 5  . Lancets MISC Use as directed.  Accu chek 2 100 each 11  . lisinopril-hydrochlorothiazide (PRINZIDE,ZESTORETIC) 10-12.5 MG tablet Take 1 tablet by mouth daily. 30 tablet 2  . lisinopril-hydrochlorothiazide (PRINZIDE,ZESTORETIC) 10-12.5 MG tablet TAKE 1 TABLET BY MOUTH EVERY DAY 90 tablet 0  . Multiple Vitamins-Minerals (MULTIVITAMIN WITH MINERALS) tablet Take 1 tablet by mouth daily.    . timolol (BETIMOL) 0.5 % ophthalmic solution Place 1 drop into the right eye 2 (two) times daily.    Marland Kitchen  ammonium lactate (LAC-HYDRIN) 12 % lotion Apply to lower legs daily for dry skin (Patient not taking: Reported on 09/24/2018) 400 g 0   No current facility-administered medications on file prior to visit.     No Known Allergies  Social History   Socioeconomic History  . Marital status: Single    Spouse name: Not on file  . Number of children: Not on file  . Years of education: Not on file  . Highest education level: Not on file  Occupational History  . Occupation: Theatre stage manager: [employer]  . Financial resource strain: Not on file  . Food insecurity:    Worry: Not on file    Inability: Not on file  . Transportation needs:    Medical: Not on file    Non-medical: Not on file  Tobacco Use  . Smoking status: Never Smoker  . Smokeless tobacco: Never Used  Substance and Sexual Activity  . Alcohol use: No  . Drug use: No  . Sexual activity: Never  Lifestyle  . Physical activity:    Days per week: Not on file    Minutes per session: Not on file  . Stress: Not on file  Relationships  . Social connections:    Talks on phone: Not on file    Gets together: Not on file    Attends religious service: Not on file    Active member of club or organization: Not on file    Attends meetings of clubs or organizations: Not on file    Relationship status: Not on file  . Intimate partner violence:    Fear of current or ex partner: Not on file    Emotionally abused: Not on file    Physically abused: Not on file    Forced sexual activity: Not on file  Other Topics Concern  . Not on file  Social History Narrative   Works at [employer]    Family History  Problem Relation Age of [place]set  . Coronary artery disease Father        Developed in his 25s  . Kidney disease Father   . Diabetes Father   . Melanoma Father   . Rectal cancer Father   . Heart failure Mother   . Hypertension Mother   . Diabetes Sister   . Diabetes Brother   . Colon cancer Neg Hx   . Esophageal cancer Neg Hx   . Stomach cancer Neg Hx     Past Surgical History:  Procedure Laterality Date  . CHOLECYSTECTOMY N/A 09/21/2017   Procedure: LAPAROSCOPIC CHOLECYSTECTOMY WITH INTRAOPERATIVE CHOLANGIOGRAM;  Surgeon: Michael Boston, MD;  Location: [facility];  Service: General;  Laterality: N/A;  . COLONOSCOPY  2000   Dr. Collene Mares  . EYE SURGERY    . IRRIGATION AND DEBRIDEMENT ABSCESS Left 05/05/2014   Procedure: IRRIGATION AND DEBRIDEMENT ABSCESS left buttock;  Surgeon: Michael Boston, MD;   Location: [facility];  Service: General;  Laterality: Left;  . LEFT HEART CATHETERIZATION WITH CORONARY ANGIOGRAM N/A 07/18/2011   Procedure: LEFT HEART CATHETERIZATION WITH CORONARY ANGIOGRAM;  Surgeon: Hillary Bow, MD;  Location: [facility] CATH LAB;  Service: Cardiovascular;  Laterality: N/A;  . PERCUTANEOUS CORONARY STENT INTERVENTION (PCI-S)  07/18/2011   Procedure: PERCUTANEOUS CORONARY STENT INTERVENTION (PCI-S);  Surgeon: Hillary Bow, MD;  Location[facility] CATH LAB;  Service: Cardiovascular;;    ROS: Review of Systems Negative except as above. PHYSICAL EXAM: BP 134/70  Pulse 60   Temp (!) 97.5 F (36.4 C) (Oral)   Ht 5' 10"  (1.778 m)   Wt 239 lb 6.4 oz (108.6 kg)   SpO2 99%   BMI 34.35 kg/m   Wt Readings from Last 3 Encounters:  09/24/18 239 lb 6.4 oz (108.6 kg)  06/24/18 236 lb 3.2 oz (107.1 kg)  03/23/18 228 lb 6.4 oz (103.6 kg)   BP 134/70 Physical Exam  General appearance - alert, well appearing, older Caucasian male and in no distress.  His clothes are soiled Mental status - normal mood, behavior, speech, dress, motor activity, and thought processes Mouth - mucous membranes moist, pharynx normal without lesions Neck - supple, no significant adenopathy Chest - clear to auscultation, no wheezes, rales or rhonchi, symmetric air entry Heart - normal rate, regular rhythm, normal S1, S2, no murmurs, rubs, clicks or gallops Extremities -1+ bilateral lower extremity edema. Diabetic Foot Exam - Simple   Simple Foot Form Visual Inspection See comments:  Yes Sensation Testing Intact to touch and monofilament testing bilaterally:  Yes Pulse Check Posterior Tibialis and Dorsalis pulse intact bilaterally:  Yes Comments Toenails are thick and overgrown.  He has moderate dry skin on the dorsal surface of both feet and between the toes.     Results for orders placed or performed in visit on 09/24/18  POCT glucose (manual entry)  Result Value Ref Range   POC Glucose 134 (A)  70 - 99 mg/dl  POCT glycosylated hemoglobin (Hb A1C)  Result Value Ref Range   Hemoglobin A1C     HbA1c POC (<> result, manual entry)     HbA1c, POC (prediabetic range)     HbA1c, POC (controlled diabetic range) 8.2 (A) 0.0 - 7.0 %   Microalbumin 190  ASSESSMENT AND PLAN: 1. Uncontrolled type 2 diabetes mellitus with peripheral neuropathy ([place]) Commended him on improving on his eating habits.  More detailed dietary counseling given.  His A1c has decreased a little more than one point.  No changes made in insulin today as he does not have any BS log for me to review.  Prescription given for him to get diabetic testing supplies.  Advised him to check blood sugars at least 3 times a day and bring in readings on next visit. - POCT glucose (manual entry) - POCT glycosylated hemoglobin (Hb A1C) - blood glucose meter kit and supplies KIT; Dispense based on patient and insurance preference. Use up to four times daily as directed. One Touch Verio  Dispense: 1 each; Refill: 0 - Ambulatory referral to Podiatry  2. Microalbuminuria due to type 2 diabetes mellitus (HCC) Cont Lis/HCTZ  3. Essential hypertension 4. Edema of both legs Encourage salt restriction Advise to take the HCTZ 12.5 mg every day  5. Coronary artery disease involving native coronary artery of native heart without angina pectoris Cont statin, ASA - Ambulatory referral to Cardiology  6. Overgrown toenails - Ambulatory referral to Podiatry      Patient was given the opportunity to ask questions.  Patient verbalized understanding of the plan and was able to repeat key elements of the plan.   Orders Placed This Encounter  Procedures  . Ambulatory referral to Podiatry  . Ambulatory referral to Cardiology  . POCT glucose (manual entry)  . POCT glycosylated hemoglobin (Hb A1C)     Requested Prescriptions   Signed Prescriptions Disp Refills  . blood glucose meter kit and supplies KIT 1 each 0    Sig: Dispense based  on patient  and insurance preference. Use up to four times daily as directed. One Touch Verio    Return in about 3 months (around 12/23/2018).  Karle Plumber, MD, FACP

## 2018-09-24 NOTE — Patient Instructions (Signed)
Please take the hydrochlorothiazide daily to help decrease the swelling that you are having in the legs.  I have submitted a referral for you to see a cardiologist and the podiatrist.  Please check your blood sugars twice a day and bring in those readings on your next visit.

## 2018-09-27 DIAGNOSIS — H4089 Other specified glaucoma: Secondary | ICD-10-CM | POA: Diagnosis not present

## 2018-09-27 DIAGNOSIS — Z794 Long term (current) use of insulin: Secondary | ICD-10-CM | POA: Diagnosis not present

## 2018-09-27 DIAGNOSIS — E113553 Type 2 diabetes mellitus with stable proliferative diabetic retinopathy, bilateral: Secondary | ICD-10-CM | POA: Diagnosis not present

## 2018-09-27 DIAGNOSIS — H4052X2 Glaucoma secondary to other eye disorders, left eye, moderate stage: Secondary | ICD-10-CM | POA: Diagnosis not present

## 2018-09-27 DIAGNOSIS — Z961 Presence of intraocular lens: Secondary | ICD-10-CM | POA: Diagnosis not present

## 2018-10-06 NOTE — Progress Notes (Deleted)
Cardiology Office Note   Date:  10/06/2018   ID:  Derek Lowery, DOB [birth year], MRN [id number]  PCP:  Derek Pier, MD  Cardiologist:   Derek Latch, MD Referring:  ***  No chief complaint on file.     History of Present Illness: Derek Lowery is a 63 y.o. male who presents for follow up of CAD.  ***    He was seen last by Dr. Lia Lowery in the office  2013. He has a history of PCI/stent as below.   He was seen by Dr. Oval Lowery prior to cholecystectomy last year and he had a NL EF.  He had surgery without cardiac issues.  ***  Past Medical History:  Diagnosis Date  . Arthritis   . CAD (coronary artery disease)    NSTEMI 06/2011:  LHC 07/18/11: Proximal diagonal 60%, distal LAD with a diabetic appearance and 60% stenosis, OM2 with an occluded superior branch and an inferior branch with 90%, EF 55% with inferior hypokinesis.  PCI: Promus DES to the OM2 inferior branch.  This vessel provides collaterals to the superior branch which remained occluded.  Echocardiogram 07/18/11: EF 60%, normal wall motion.  . Cataract   . Essential hypertension, benign   . Glaucoma   . Hypercholesteremia   . Internal hemorrhoids   . Myocardial infarction ([place])   . Noncompliance   . Type 2 diabetes mellitus ([place]) 1990    Past Surgical History:  Procedure Laterality Date  . CHOLECYSTECTOMY N/A 09/21/2017   Procedure: LAPAROSCOPIC CHOLECYSTECTOMY WITH INTRAOPERATIVE CHOLANGIOGRAM;  Surgeon: Derek Boston, MD;  Location: [facility];  Service: General;  Laterality: N/A;  . COLONOSCOPY  2000   Derek Lowery  . EYE SURGERY    . IRRIGATION AND DEBRIDEMENT ABSCESS Left 05/05/2014   Procedure: IRRIGATION AND DEBRIDEMENT ABSCESS left buttock;  Surgeon: Derek Boston, MD;  Location: [facility];  Service: General;  Laterality: Left;  . LEFT HEART CATHETERIZATION WITH CORONARY ANGIOGRAM N/A 07/18/2011   Procedure: LEFT HEART CATHETERIZATION WITH CORONARY ANGIOGRAM;  Surgeon: Derek Bow, MD;  Location:  [facility] CATH LAB;  Service: Cardiovascular;  Laterality: N/A;  . PERCUTANEOUS CORONARY STENT INTERVENTION (PCI-S)  07/18/2011   Procedure: PERCUTANEOUS CORONARY STENT INTERVENTION (PCI-S);  Surgeon: Derek Bow, MD;  Location: [facility] CATH LAB;  Service: Cardiovascular;;     Current Outpatient Medications  Medication Sig Dispense Refill  . ammonium lactate (LAC-HYDRIN) 12 % lotion Apply to lower legs daily for dry skin (Patient not taking: Reported on 09/24/2018) 400 g 0  . aspirin EC 81 MG tablet Take 1 tablet (81 mg total) by mouth daily. 30 tablet 4  . atorvastatin (LIPITOR) 40 MG tablet TAKE 1 TABLET BY MOUTH EVERY DAY FOR CHOLESTEROL 90 tablet 0  . blood glucose meter kit and supplies KIT Dispense based on patient and insurance preference. Use up to four times daily as directed. One Touch Verio 1 each 0  . dorzolamide (TRUSOPT) 2 % ophthalmic solution Place 1 drop into the right eye 2 (two) times daily.    Marland Kitchen glucose blood (ACCU-CHEK AVIVA PLUS) test strip Use as instructed for 3 times daily testing of blood sugar. E11.9 100 each 6  . glucose monitoring kit (FREESTYLE) monitoring kit 1 each by Does not apply route as needed for other. 1 each 0  . hydrochlorothiazide (HYDRODIURIL) 12.5 MG tablet TAKE 1 TABLET BY MOUTH ON M/W/F AND SAT 30 tablet 0  . Insulin Glargine (LANTUS SOLOSTAR) 100 UNIT/ML Solostar Pen Inject 23  Units into the skin daily. 15 mL 6  . insulin lispro (HUMALOG KWIKPEN) 100 UNIT/ML KwikPen 8 units TID with meals 15 mL 11  . Insulin Pen Needle (BD PEN NEEDLE NANO U/F) 32G X 4 MM MISC Use as directed 100 each 5  . Lancets MISC Use as directed.  Accu chek 2 100 each 11  . lisinopril-hydrochlorothiazide (PRINZIDE,ZESTORETIC) 10-12.5 MG tablet Take 1 tablet by mouth daily. 30 tablet 2  . lisinopril-hydrochlorothiazide (PRINZIDE,ZESTORETIC) 10-12.5 MG tablet TAKE 1 TABLET BY MOUTH EVERY DAY 90 tablet 0  . Multiple Vitamins-Minerals (MULTIVITAMIN WITH MINERALS) tablet Take 1 tablet by  mouth daily.    . timolol (BETIMOL) 0.5 % ophthalmic solution Place 1 drop into the right eye 2 (two) times daily.     No current facility-administered medications for this visit.     Allergies:   Patient has no known allergies.    Social History:  The patient  reports that he has never smoked. He has never used smokeless tobacco. He reports that he does not drink alcohol or use drugs.   Family History:  The patient's ***family history includes Coronary artery disease in his father; Diabetes in his brother, father, and sister; Heart failure in his mother; Hypertension in his mother; Kidney disease in his father; Melanoma in his father; Rectal cancer in his father.    ROS:  Please see the history of present illness.   Otherwise, review of systems are positive for {NONE DEFAULTED:18576::"none"}.   All other systems are reviewed and negative.    PHYSICAL EXAM: VS:  There were no vitals taken for this visit. , BMI There is no height or weight on file to calculate BMI. GENERAL:  Well appearing HEENT:  Pupils equal round and reactive, fundi not visualized, oral mucosa unremarkable NECK:  No jugular venous distention, waveform within normal limits, carotid upstroke brisk and symmetric, no bruits, no thyromegaly LYMPHATICS:  No cervical, inguinal adenopathy LUNGS:  Clear to auscultation bilaterally BACK:  No CVA tenderness CHEST:  Unremarkable HEART:  PMI not displaced or sustained,S1 and S2 within normal limits, no S3, no S4, no clicks, no rubs, *** murmurs ABD:  Flat, positive bowel sounds normal in frequency in pitch, no bruits, no rebound, no guarding, no midline pulsatile mass, no hepatomegaly, no splenomegaly EXT:  2 plus pulses throughout, no edema, no cyanosis no clubbing SKIN:  No rashes no nodules NEURO:  Cranial nerves II through XII grossly intact, motor grossly intact throughout PSYCH:  Cognitively intact, oriented to person place and time    EKG:  EKG {ACTION; IS/IS  VZC:[id number]} ordered today. The ekg ordered today demonstrates ***   Recent Labs: 04/11/2018: ALT 21; Hemoglobin 13.8; Platelets 171 06/24/2018: BUN 20; Creatinine, Ser 1.21; Potassium 4.5; Sodium 137    Lipid Panel    Component Value Date/Time   CHOL 111 03/23/2018 1124   TRIG 229 (H) 03/23/2018 1124   HDL 30 (L) 03/23/2018 1124   CHOLHDL 3.7 03/23/2018 1124   CHOLHDL 3.2 11/29/2015 0936   VLDL 19 11/29/2015 0936   LDLCALC 35 03/23/2018 1124     Lab Results  Component Value Date   HGBA1C 8.2 (A) 09/24/2018    Wt Readings from Last 3 Encounters:  09/24/18 239 lb 6.4 oz (108.6 kg)  06/24/18 236 lb 3.2 oz (107.1 kg)  03/23/18 228 lb 6.4 oz (103.6 kg)      Other studies Reviewed: Additional studies/ records that were reviewed today include: ***. Review of the above records demonstrates:  Please see elsewhere in the note.  ***   ASSESSMENT AND PLAN:  HTN:  ***  CAD:  ***  DYSLIPIDEMIA:  ***  DM II:  He is not at target with his A1C.  ***   Current medicines are reviewed at length with the patient today.  The patient {ACTIONS; HAS/DOES NOT HAVE:19233} concerns regarding medicines.  The following changes have been made:  {PLAN; NO CHANGE:13088:s}  Labs/ tests ordered today include: *** No orders of the defined types were placed in this encounter.    Disposition:   FU with ***    Signed, Minus Breeding, MD  10/06/2018 8:26 PM    [facility]

## 2018-10-08 ENCOUNTER — Ambulatory Visit: Payer: Medicare Other | Admitting: Cardiology

## 2018-10-13 ENCOUNTER — Other Ambulatory Visit: Payer: Self-pay | Admitting: Internal Medicine

## 2018-10-14 ENCOUNTER — Other Ambulatory Visit: Payer: Self-pay

## 2018-10-14 ENCOUNTER — Ambulatory Visit (INDEPENDENT_AMBULATORY_CARE_PROVIDER_SITE_OTHER): Payer: Medicaid Other | Admitting: Podiatry

## 2018-10-14 ENCOUNTER — Encounter: Payer: Self-pay | Admitting: Podiatry

## 2018-10-14 VITALS — BP 136/76 | HR 79

## 2018-10-14 DIAGNOSIS — M79674 Pain in right toe(s): Secondary | ICD-10-CM | POA: Diagnosis not present

## 2018-10-14 DIAGNOSIS — E1142 Type 2 diabetes mellitus with diabetic polyneuropathy: Secondary | ICD-10-CM

## 2018-10-14 DIAGNOSIS — B351 Tinea unguium: Secondary | ICD-10-CM

## 2018-10-14 DIAGNOSIS — M79675 Pain in left toe(s): Secondary | ICD-10-CM | POA: Diagnosis not present

## 2018-10-14 DIAGNOSIS — L84 Corns and callosities: Secondary | ICD-10-CM

## 2018-10-14 NOTE — Progress Notes (Signed)
Subjective: Derek Lowery presents today referred by Ladell Pier, MD with diabetes and cc of painful, discolored, thick toenails which interfere with daily activities.  Pain is aggravated when wearing enclosed shoe gear. He has had professional podiatry care in the past. Pain is relieved with periodic professional debridement.  Patient also relates h/o ulceration right foot which has healed. He now wears diabetic shoes. He also wears compression knee highs for his lower extremity edema.  Past Medical History:  Diagnosis Date  . Arthritis   . CAD (coronary artery disease)    NSTEMI 06/2011:  LHC 07/18/11: Proximal diagonal 60%, distal LAD with a diabetic appearance and 60% stenosis, OM2 with an occluded superior branch and an inferior branch with 90%, EF 55% with inferior hypokinesis.  PCI: Promus DES to the OM2 inferior branch.  This vessel provides collaterals to the superior branch which remained occluded.  Echocardiogram 07/18/11: EF 60%, normal wall motion.  . Cataract   . Essential hypertension, benign   . Glaucoma   . Hypercholesteremia   . Internal hemorrhoids   . Myocardial infarction ([place])   . Noncompliance   . Type 2 diabetes mellitus ([place]) 1990    Patient Active Problem List   Diagnosis Date Noted  . Benign paroxysmal positional vertigo 06/25/2018  . Pre-ulcerative corn or callous 10/13/2017  . Anemia 10/13/2017  . Acute gangrenous cholecystitis s/p lap cholecystectomy 09/21/2017 09/19/2017  . Chronic diarrhea 06/09/2017  . Microalbuminuria 03/07/2017  . Dry skin 03/05/2017  . Glaucoma 03/05/2017  . Legally blind 03/05/2017  . Retinopathy due to secondary diabetes ([place]) 02/06/2016  . Diabetic polyneuropathy associated with type 2 diabetes mellitus ([place]) 01/12/2015  . Fournier's gangrene into true pelvis s/p I&D 05/05/2014 05/05/2014  . Poorly controlled type 2 diabetes mellitus ([place]) 07/17/2011  . HLD (hyperlipidemia) 07/17/2011  . CAD in native artery 07/17/2011   . Essential hypertension 07/17/2011    Past Surgical History:  Procedure Laterality Date  . CHOLECYSTECTOMY N/A 09/21/2017   Procedure: LAPAROSCOPIC CHOLECYSTECTOMY WITH INTRAOPERATIVE CHOLANGIOGRAM;  Surgeon: Michael Boston, MD;  Location: [facility];  Service: General;  Laterality: N/A;  . COLONOSCOPY  2000   Dr. Collene Mares  . EYE SURGERY    . IRRIGATION AND DEBRIDEMENT ABSCESS Left 05/05/2014   Procedure: IRRIGATION AND DEBRIDEMENT ABSCESS left buttock;  Surgeon: Michael Boston, MD;  Location: [facility];  Service: General;  Laterality: Left;  . LEFT HEART CATHETERIZATION WITH CORONARY ANGIOGRAM N/A 07/18/2011   Procedure: LEFT HEART CATHETERIZATION WITH CORONARY ANGIOGRAM;  Surgeon: Hillary Bow, MD;  Location: [facility] CATH LAB;  Service: Cardiovascular;  Laterality: N/A;  . PERCUTANEOUS CORONARY STENT INTERVENTION (PCI-S)  07/18/2011   Procedure: PERCUTANEOUS CORONARY STENT INTERVENTION (PCI-S);  Surgeon: Hillary Bow, MD;  Location: [facility] CATH LAB;  Service: Cardiovascular;;     Current Outpatient Medications:  .  ammonium lactate (LAC-HYDRIN) 12 % lotion, Apply to lower legs daily for dry skin (Patient not taking: Reported on 09/24/2018), Disp: 400 g, Rfl: 0 .  aspirin EC 81 MG tablet, Take 1 tablet (81 mg total) by mouth daily., Disp: 30 tablet, Rfl: 4 .  atorvastatin (LIPITOR) 40 MG tablet, TAKE 1 TABLET BY MOUTH EVERY DAY FOR CHOLESTEROL, Disp: 90 tablet, Rfl: 0 .  blood glucose meter kit and supplies KIT, Dispense based on patient and insurance preference. Use up to four times daily as directed. One Touch Verio, Disp: 1 each, Rfl: 0 .  brimonidine (ALPHAGAN) 0.2 % ophthalmic solution, INSTILL 1 DROP INTO RIGHT EYE  TWICE A DAY, Disp: , Rfl:  .  dorzolamide (TRUSOPT) 2 % ophthalmic solution, Place 1 drop into the right eye 2 (two) times daily., Disp: , Rfl:  .  glucose blood (ACCU-CHEK AVIVA PLUS) test strip, Use as instructed for 3 times daily testing of blood sugar. E11.9, Disp: 100 each, Rfl: 6 .   glucose monitoring kit (FREESTYLE) monitoring kit, 1 each by Does not apply route as needed for other., Disp: 1 each, Rfl: 0 .  hydrochlorothiazide (HYDRODIURIL) 12.5 MG tablet, TAKE 1 TABLET BY MOUTH ON M/W/F AND SAT, Disp: 30 tablet, Rfl: 0 .  Insulin Glargine (LANTUS SOLOSTAR) 100 UNIT/ML Solostar Pen, Inject 23 Units into the skin daily., Disp: 15 mL, Rfl: 6 .  insulin lispro (HUMALOG KWIKPEN) 100 UNIT/ML KwikPen, 8 units TID with meals, Disp: 15 mL, Rfl: 11 .  Insulin Pen Needle (BD PEN NEEDLE NANO U/F) 32G X 4 MM MISC, Use as directed, Disp: 100 each, Rfl: 5 .  Lancets MISC, Use as directed.  Accu chek 2, Disp: 100 each, Rfl: 11 .  lisinopril-hydrochlorothiazide (PRINZIDE,ZESTORETIC) 10-12.5 MG tablet, Take 1 tablet by mouth daily., Disp: 30 tablet, Rfl: 2 .  lisinopril-hydrochlorothiazide (PRINZIDE,ZESTORETIC) 10-12.5 MG tablet, TAKE 1 TABLET BY MOUTH EVERY DAY, Disp: 90 tablet, Rfl: 0 .  Multiple Vitamins-Minerals (MULTIVITAMIN WITH MINERALS) tablet, Take 1 tablet by mouth daily., Disp: , Rfl:  .  timolol (BETIMOL) 0.5 % ophthalmic solution, Place 1 drop into the right eye 2 (two) times daily., Disp: , Rfl:  .  timolol (TIMOPTIC) 0.5 % ophthalmic solution, INSTILL 1 DROP INTO RIGHT EYE TWICE A DAY, Disp: , Rfl:   No Known Allergies  Social History   Occupational History  . Occupation: Theatre stage manager: [employer]  Tobacco Use  . Smoking status: Never Smoker  . Smokeless tobacco: Never Used  Substance and Sexual Activity  . Alcohol use: No  . Drug use: No  . Sexual activity: Never    Family History  Problem Relation Age of Onset  . Coronary artery disease Father        Developed in his 29s  . Kidney disease Father   . Diabetes Father   . Melanoma Father   . Rectal cancer Father   . Heart failure Mother   . Hypertension Mother   . Diabetes Sister   . Diabetes Brother   . Colon cancer Neg Hx   . Esophageal cancer Neg Hx   . Stomach cancer Neg Hx     Immunization  History  Administered Date(s) Administered  . Influenza,inj,Quad PF,6+ Mos 05/19/2014, 06/09/2017, 06/24/2018  . Pneumococcal Polysaccharide-23 05/19/2014  . Tdap 03/26/2015     Review of systems: Positive Findings in bold print.  Constitutional:  chills, fatigue, fever, sweats, weight change Communication: Optometrist, sign Ecologist, hand writing, iPad/Android device Head: headaches, head injury Eyes: changes in vision, eye pain, glaucoma, cataracts, macular degeneration, diplopia, glare,  light sensitivity, eyeglasses or contacts, blindness Ears nose mouth throat: Hard of hearing, ringing in ears, deaf, sign language,  vertigo,   nosebleeds,  rhinitis,  cold sores, snoring, swollen glands Cardiovascular: HTN, edema, arrhythmia, pacemaker in place, defibrillator in place,  chest pain/tightness, chronic anticoagulation, blood clot, heart failure, MI Peripheral Vascular: leg cramps, varicose veins, blood clots, lymphedema Respiratory:  difficulty breathing, denies congestion, SOB, wheezing, cough, emphysema Gastrointestinal: change in appetite or weight, abdominal pain, constipation, diarrhea, nausea, vomiting, vomiting blood, change in bowel habits, abdominal pain, jaundice, rectal bleeding, hemorrhoids, Genitourinary:  nocturia,  pain on urination,  blood in urine, Foley catheter, urinary urgency Musculoskeletal: uses mobility aid,  cramping, stiff joints, painful joints, decreased joint motion, fractures, OA, gout Skin: +changes in toenails, color change, dryness, itching, mole changes,  rash  Neurological: headaches, numbness in feet, paresthesias in feet, burning in feet, fainting,  seizures, change in speech. denies headaches, memory problems/poor historian, cerebral palsy, weakness, paralysis Endocrine: diabetes, hypothyroidism, hyperthyroidism,  goiter, dry mouth, flushing, heat intolerance,  cold intolerance,  excessive thirst, denies polyuria,  nocturia Hematological:  easy  bleeding, excessive bleeding, easy bruising, enlarged lymph nodes, on long term blood thinner, history of past transusions Allergy/immunological:  hives, eczema, frequent infections, multiple drug allergies, seasonal allergies, transplant recipient Psychiatric:  anxiety, depression, mood disorder, suicidal ideations, hallucinations   Objective: Vascular Examination: Capillary refill time less than 3 seconds x 10 digits  Dorsalis pedis pulses palpable bilaterally.  Posterior tibial pulses faintly palpable bilaterally.  No digital hair x 10 digits  Skin temperature gradient WNL b/l  +1 edema BLE  Dermatological Examination: Skin noted to be warm, dry and flaky bilaterally.  No open wounds noted bilaterally.  No interdigital macerations noted bilaterally.  Toenails 1-5 b/l discolored, thick, dystrophic with subungual debris and pain with palpation to nailbeds due to thickness of nails.  Hyperkeratotic lesion noted submetatarsal head 4 of the right foot with visible subdermal hemorrhage.  There is no erythema, no edema, no drainage, no flocculence noted.  Musculoskeletal: Muscle strength 5/5 to all LE muscle groups  Neurological: Sensation diminished with 10 gram monofilament.  Vibratory sensation diminished.  Assessment: 1. Painful onychomycosis toenails 1-5 b/l  2. Pre-ulcerative callus submetatarsal head 4 right foot 3. Xerosis bilateral feet 4. NIDDM with neuropathy  Plan: 1. Discussed diabetic foot care principles. Literature dispensed on today. 2. Toenails 1-5 b/l were debrided in length and girth without iatrogenic bleeding. 3. Pre-ulcerative callus submetatarsal head 4 right foot pared with sterile scalpel blade without incident. 4. For xerosis, patient has prescription for AmLactin lotion.  He was instructed to apply to his feet twice daily avoiding application between toes.  Patient related understanding. 5. Patient to continue soft, supportive shoe  gear 6. Patient to report any pedal injuries to medical professional immediately. 7. Follow up 3 months.  8. Patient/POA to call should there be a concern in the interim.

## 2018-10-14 NOTE — Patient Instructions (Addendum)
Diabetic Neuropathy Diabetic neuropathy refers to nerve damage that is caused by diabetes (diabetes mellitus). Over time, people with diabetes can develop nerve damage throughout the body. There are several types of diabetic neuropathy:  Peripheral neuropathy. This is the most common type of diabetic neuropathy. It causes damage to nerves that carry signals between the spinal cord and other parts of the body (peripheral nerves). This usually affects nerves in the feet and legs first, and may eventually affect the hands and arms. The damage affects the ability to sense touch or temperature.  Autonomic neuropathy. This type causes damage to nerves that control involuntary functions (autonomic nerves). These nerves carry signals that control: ? Heartbeat. ? Body temperature. ? Blood pressure. ? Urination. ? Digestion. ? Sweating. ? Sexual function. ? Response to changing blood sugar (glucose) levels.  Focal neuropathy. This type of nerve damage affects one area of the body, such as an arm, a leg, or the face. The injury may involve one nerve or a small group of nerves. Focal neuropathy can be painful and unpredictable, and occurs most often in older adults with diabetes. This often develops suddenly, but usually improves over time and does not cause long-term problems.  Proximal neuropathy. This type of nerve damage affects the nerves of the thighs, hips, buttocks, or legs. It causes severe pain, weakness, and muscle death (atrophy), usually in the thigh muscles. It is more common among older men and people who have type 2 diabetes. The length of recovery time may vary. What are the causes? Peripheral, autonomic, and focal neuropathies are caused by diabetes that is not well controlled with treatment. The cause of proximal neuropathy is not known, but it may be caused by inflammation related to uncontrolled blood glucose levels. What are the signs or symptoms? Peripheral neuropathy Peripheral  neuropathy develops slowly over time. When the nerves of the feet and legs no longer work, you may experience:  Burning, stabbing, or aching pain in the legs or feet.  Pain or cramping in the legs or feet.  Loss of feeling (numbness) and inability to feel pressure or pain in the feet. This can lead to: ? Thick calluses or sores on areas of constant pressure. ? Ulcers. ? Reduced ability to feel temperature changes.  Foot deformities.  Muscle weakness.  Loss of balance or coordination. Autonomic neuropathy The symptoms of autonomic neuropathy vary depending on which nerves are affected. Symptoms may include:  Problems with digestion, such as: ? Nausea or vomiting. ? Poor appetite. ? Bloating. ? Diarrhea or constipation. ? Trouble swallowing. ? Losing weight without trying to.  Problems with the heart, blood and lungs, such as: ? Dizziness, especially when standing up. ? Fainting. ? Shortness of breath. ? Irregular heartbeat.  Bladder problems, such as: ? Trouble starting or stopping urination. ? Leaking urine. ? Trouble emptying the bladder. ? Urinary tract infections (UTIs).  Problems with other body functions, such as: ? Sweat. You may sweat too much or too little. ? Temperature. You might get hot easily. Or, you might feel cold more than usual. ? Sexual function. Men may not be able to get or maintain an erection. Women may have vaginal dryness and difficulty with arousal. Focal neuropathy Symptoms affect only one area of the body. Common symptoms include:  Numbness.  Tingling.  Burning pain.  Prickling feeling.  Very sensitive skin.  Weakness.  Inability to move (paralysis).  Muscle twitching.  Muscles getting smaller (wasting).  Poor coordination.  Double or blurred vision. Proximal  neuropathy  Sudden, severe pain in the hip, thigh, or buttocks. Pain may spread from the back into the legs (sciatica).  Pain and numbness in the arms and  legs.  Tingling.  Loss of bladder control or bowel control.  Weakness and wasting of thigh muscles.  Difficulty getting up from a seated position.  Abdominal swelling.  Unexplained weight loss. How is this diagnosed? Diagnosis usually involves reviewing your medical history and any symptoms you have. Diagnosis varies depending on the type of neuropathy your health care provider suspects. Peripheral neuropathy Your health care provider will check areas that are affected by your nervous system (neurologic exam), such as your reflexes, how you move, and what you can feel. You may have other tests, such as:  Blood tests.  Removal and examination of fluid that surrounds the spinal cord (lumbar puncture).  CT scan.  MRI.  A test to check the nerves that control muscles (electromyogram, EMG).  Tests of how quickly messages pass through your nerves (nerve conduction velocity tests).  Removal of a small piece of nerve to be examined under a microscope (biopsy). Autonomic neuropathy You may have tests, such as:  Tests to measure your blood pressure and heart rate. This may include monitoring you while you are safely secured to an exam table that moves you from a lying position to an upright position (table tilt test).  Breathing tests to check your lungs.  Tests to check how food moves through the digestive system (gastric emptying tests).  Blood, sweat, or urine tests.  Ultrasound of your bladder.  Spinal fluid tests. Focal neuropathy This condition may be diagnosed with:  A neurologic exam.  CT scan.  MRI.  EMG.  Nerve conduction velocity tests. Proximal neuropathy There is no test to diagnose this type of neuropathy. You may have tests to rule out other possible causes of this type of neuropathy. Tests may include:  X-rays of your spine and lumbar region.  Lumbar puncture.  MRI. How is this treated? The goal of treatment is to keep nerve damage from getting  worse. The most important part of treatment is keeping your blood glucose level and your A1C level within your target range by following your diabetes management plan. Over time, maintaining lower blood glucose levels helps lessen symptoms. In some cases, you may need prescription pain medicine. Follow these instructions at home:  Lifestyle   Do not use any products that contain nicotine or tobacco, such as cigarettes and e-cigarettes. If you need help quitting, ask your health care provider.  Be physically active every day. Include strength training and balance exercises.  Follow a healthy meal plan.  Work with your health care provider to manage your blood pressure. General instructions  Follow your diabetes management plan as directed. ? Check your blood glucose levels as directed by your health care provider. ? Keep your blood glucose in your target range as directed by your health care provider. ? Have your A1C level checked at least two times a year, or as often as told by your health care provider.  Take over the counter and prescription medicines only as told by your health care provider. This includes insulin and diabetes medicine.  Do not drive or use heavy machinery while taking prescription pain medicines.  Check your skin and feet every day for cuts, bruises, redness, blisters, or sores.  Keep all follow up visits as told by your health care provider. This is important. Contact a health care provider if:  You have burning, stabbing, or aching pain in your legs or feet.  You are unable to feel pressure or pain in your feet.  You develop problems with digestion, such as: ? Nausea. ? Vomiting. ? Bloating. ? Constipation. ? Diarrhea. ? Abdominal pain.  You have difficulty with urination, such as inability: ? To control when you urinate (incontinence). ? To completely empty the bladder (retention).  You have palpitations.  You feel dizzy, weak, or faint when you  stand up. Get help right away if:  You cannot urinate.  You have sudden weakness or loss of coordination.  You have trouble speaking.  You have pain or pressure in your chest.  You have an irregular heart beat.  You have sudden inability to move a part of your body. Summary  Diabetic neuropathy refers to nerve damage that is caused by diabetes. It can affect nerves throughout the entire body, causing numbness and pain in the arms, legs, digestive tract, heart, and other body systems.  Keep your blood glucose level and your blood pressure in your target range, as directed by your health care provider. This can help prevent neuropathy from getting worse.  Check your skin and feet every day for cuts, bruises, redness, blisters, or sores.  Do not use any products that contain nicotine or tobacco, such as cigarettes and e-cigarettes. If you need help quitting, ask your health care provider. This information is not intended to replace advice given to you by your health care provider. Make sure you discuss any questions you have with your health care provider. Document Released: 10/13/2001 Document Revised: 09/16/2017 Document Reviewed: 09/08/2016 Elsevier Interactive Patient Education  2019 [employer]  Onychomycosis/Fungal Toenails  WHAT IS IT? An infection that lies within the keratin of your nail plate that is caused by a fungus.  WHY ME? Fungal infections affect all ages, sexes, races, and creeds.  There may be many factors that predispose you to a fungal infection such as age, coexisting medical conditions such as diabetes, or an autoimmune disease; stress, medications, fatigue, genetics, etc.  Bottom line: fungus thrives in a warm, moist environment and your shoes offer such a location.  IS IT CONTAGIOUS? Theoretically, yes.  You do not want to share shoes, nail clippers or files with someone who has fungal toenails.  Walking around barefoot in the same room or sleeping in the  same bed is unlikely to transfer the organism.  It is important to realize, however, that fungus can spread easily from one nail to the next on the same foot.  HOW DO WE TREAT THIS?  There are several ways to treat this condition.  Treatment may depend on many factors such as age, medications, pregnancy, liver and kidney conditions, etc.  It is best to ask your doctor which options are available to you.  1. No treatment.   Unlike many other medical concerns, you can live with this condition.  However for many people this can be a painful condition and may lead to ingrown toenails or a bacterial infection.  It is recommended that you keep the nails cut short to help reduce the amount of fungal nail. 2. Topical treatment.  These range from herbal remedies to prescription strength nail lacquers.  About 40-50% effective, topicals require twice daily application for approximately 9 to 12 months or until an entirely new nail has grown out.  The most effective topicals are medical grade medications available through physicians offices. 3. Oral antifungal medications.  With an 80-90%  cure rate, the most common oral medication requires 3 to 4 months of therapy and stays in your system for a year as the new nail grows out.  Oral antifungal medications do require blood work to make sure it is a safe drug for you.  A liver function panel will be performed prior to starting the medication and after the first month of treatment.  It is important to have the blood work performed to avoid any harmful side effects.  In general, this medication safe but blood work is required. 4. Laser Therapy.  This treatment is performed by applying a specialized laser to the affected nail plate.  This therapy is noninvasive, fast, and non-painful.  It is not covered by insurance and is therefore, out of pocket.  The results have been very good with a 80-95% cure rate.  [facility] is the only practice in the area to offer this  therapy. 5. Permanent Nail Avulsion.  Removing the entire nail so that a new nail will not grow back. Diabetes Mellitus and Foot Care Foot care is an important part of your health, especially when you have diabetes. Diabetes may cause you to have problems because of poor blood flow (circulation) to your feet and legs, which can cause your skin to:  Become thinner and drier.  Break more easily.  Heal more slowly.  Peel and crack. You may also have nerve damage (neuropathy) in your legs and feet, causing decreased feeling in them. This means that you may not notice minor injuries to your feet that could lead to more serious problems. Noticing and addressing any potential problems early is the best way to prevent future foot problems. How to care for your feet Foot hygiene  Wash your feet daily with warm water and mild soap. Do not use hot water. Then, pat your feet and the areas between your toes until they are completely dry. Do not soak your feet as this can dry your skin.  Trim your toenails straight across. Do not dig under them or around the cuticle. File the edges of your nails with an emery board or nail file.  Apply a moisturizing lotion or petroleum jelly to the skin on your feet and to dry, brittle toenails. Use lotion that does not contain alcohol and is unscented. Do not apply lotion between your toes. Shoes and socks  Wear clean socks or stockings every day. Make sure they are not too tight. Do not wear knee-high stockings since they may decrease blood flow to your legs.  Wear shoes that fit properly and have enough cushioning. Always look in your shoes before you put them on to be sure there are no objects inside.  To break in new shoes, wear them for just a few hours a day. This prevents injuries on your feet. Wounds, scrapes, corns, and calluses  Check your feet daily for blisters, cuts, bruises, sores, and redness. If you cannot see the bottom of your feet, use a mirror  or ask someone for help.  Do not cut corns or calluses or try to remove them with medicine.  If you find a minor scrape, cut, or break in the skin on your feet, keep it and the skin around it clean and dry. You may clean these areas with mild soap and water. Do not clean the area with peroxide, alcohol, or iodine.  If you have a wound, scrape, corn, or callus on your foot, look at it several times a  day to make sure it is healing and not infected. Check for: ? Redness, swelling, or pain. ? Fluid or blood. ? Warmth. ? Pus or a bad smell. General instructions  Do not cross your legs. This may decrease blood flow to your feet.  Do not use heating pads or hot water bottles on your feet. They may burn your skin. If you have lost feeling in your feet or legs, you may not know this is happening until it is too late.  Protect your feet from hot and cold by wearing shoes, such as at the beach or on hot pavement.  Schedule a complete foot exam at least once a year (annually) or more often if you have foot problems. If you have foot problems, report any cuts, sores, or bruises to your health care provider immediately. Contact a health care provider if:  You have a medical condition that increases your risk of infection and you have any cuts, sores, or bruises on your feet.  You have an injury that is not healing.  You have redness on your legs or feet.  You feel burning or tingling in your legs or feet.  You have pain or cramps in your legs and feet.  Your legs or feet are numb.  Your feet always feel cold.  You have pain around a toenail. Get help right away if:  You have a wound, scrape, corn, or callus on your foot and: ? You have pain, swelling, or redness that gets worse. ? You have fluid or blood coming from the wound, scrape, corn, or callus. ? Your wound, scrape, corn, or callus feels warm to the touch. ? You have pus or a bad smell coming from the wound, scrape, corn, or  callus. ? You have a fever. ? You have a red line going up your leg. Summary  Check your feet every day for cuts, sores, red spots, swelling, and blisters.  Moisturize feet and legs daily.  Wear shoes that fit properly and have enough cushioning.  If you have foot problems, report any cuts, sores, or bruises to your health care provider immediately.  Schedule a complete foot exam at least once a year (annually) or more often if you have foot problems. This information is not intended to replace advice given to you by your health care provider. Make sure you discuss any questions you have with your health care provider. Document Released: 08/01/2000 Document Revised: 09/16/2017 Document Reviewed: 09/05/2016 Elsevier Interactive Patient Education  2019 [place] are small areas of thickened skin that occur on the top, sides, or tip of a toe. They contain a cone-shaped core with a point that can press on a nerve below. This causes pain.  Calluses are areas of thickened skin that can occur anywhere on the body, including the hands, fingers, palms, soles of the feet, and heels. Calluses are usually larger than corns. What are the causes? Corns and calluses are caused by rubbing (friction) or pressure, such as from shoes that are too tight or do not fit properly. What increases the risk? Corns are more likely to develop in people who have misshapen toes (toe deformities), such as hammer toes. Calluses can occur with friction to any area of the skin. They are more likely to develop in people who:  Work with their hands.  Wear shoes that fit poorly, are too tight, or are high-heeled.  Have toe deformities. What are the signs or symptoms? Symptoms  of a corn or callus include:  A hard growth on the skin.  Pain or tenderness under the skin.  Redness and swelling.  Increased discomfort while wearing tight-fitting shoes, if your feet are affected. If a corn  or callus becomes infected, symptoms may include:  Redness and swelling that gets worse.  Pain.  Fluid, blood, or pus draining from the corn or callus. How is this diagnosed? Corns and calluses may be diagnosed based on your symptoms, your medical history, and a physical exam. How is this treated? Treatment for corns and calluses may include:  Removing the cause of the friction or pressure. This may involve: ? Changing your shoes. ? Wearing shoe inserts (orthotics) or other protective layers in your shoes, such as a corn pad. ? Wearing gloves.  Applying medicine to the skin (topical medicine) to help soften skin in the hardened, thickened areas.  Removing layers of dead skin with a file to reduce the size of the corn or callus.  Removing the corn or callus with a scalpel or laser.  Taking antibiotic medicines, if your corn or callus is infected.  Having surgery, if a toe deformity is the cause. Follow these instructions at home:   Take over-the-counter and prescription medicines only as told by your health care provider.  If you were prescribed an antibiotic, take it as told by your health care provider. Do not stop taking it even if your condition starts to improve.  Wear shoes that fit well. Avoid wearing high-heeled shoes and shoes that are too tight or too loose.  Wear any padding, protective layers, gloves, or orthotics as told by your health care provider.  Soak your hands or feet and then use a file or pumice stone to soften your corn or callus. Do this as told by your health care provider.  Check your corn or callus every day for symptoms of infection. Contact a health care provider if you:  Notice that your symptoms do not improve with treatment.  Have redness or swelling that gets worse.  Notice that your corn or callus becomes painful.  Have fluid, blood, or pus coming from your corn or callus.  Have new symptoms. Summary  Corns are small areas of  thickened skin that occur on the top, sides, or tip of a toe.  Calluses are areas of thickened skin that can occur anywhere on the body, including the hands, fingers, palms, and soles of the feet. Calluses are usually larger than corns.  Corns and calluses are caused by rubbing (friction) or pressure, such as from shoes that are too tight or do not fit properly.  Treatment may include wearing any padding, protective layers, gloves, or orthotics as told by your health care provider. This information is not intended to replace advice given to you by your health care provider. Make sure you discuss any questions you have with your health care provider. Document Released: 05/10/2004 Document Revised: 06/17/2017 Document Reviewed: 06/17/2017 Elsevier Interactive Patient Education  2019 [employer].

## 2018-10-28 DIAGNOSIS — H35371 Puckering of macula, right eye: Secondary | ICD-10-CM | POA: Diagnosis not present

## 2018-10-28 DIAGNOSIS — H4050X3 Glaucoma secondary to other eye disorders, unspecified eye, severe stage: Secondary | ICD-10-CM | POA: Diagnosis not present

## 2018-10-28 DIAGNOSIS — H211X1 Other vascular disorders of iris and ciliary body, right eye: Secondary | ICD-10-CM | POA: Diagnosis not present

## 2018-10-28 DIAGNOSIS — E113511 Type 2 diabetes mellitus with proliferative diabetic retinopathy with macular edema, right eye: Secondary | ICD-10-CM | POA: Diagnosis not present

## 2018-11-02 ENCOUNTER — Other Ambulatory Visit: Payer: Self-pay | Admitting: Internal Medicine

## 2018-11-02 DIAGNOSIS — E785 Hyperlipidemia, unspecified: Secondary | ICD-10-CM

## 2018-11-03 ENCOUNTER — Encounter: Payer: Self-pay | Admitting: Cardiology

## 2018-11-11 ENCOUNTER — Ambulatory Visit: Payer: Medicare Other | Admitting: Cardiology

## 2018-11-24 ENCOUNTER — Telehealth: Payer: Self-pay

## 2018-11-24 NOTE — Telephone Encounter (Signed)
Virtual Visit Pre-Appointment Phone Call  Steps For Call: [place] PHONE  1. Confirm consent - "In the setting of the current Covid19 crisis, you are scheduled for a (phone or video) visit with your provider on (date) at (time).  Just as we do with many in-office visits, in order for you to participate in this visit, we must obtain consent.  If you'd like, I can send this to your mychart (if signed up) or email for you to review.  Otherwise, I can obtain your verbal consent now.  All virtual visits are billed to your insurance company just like a normal visit would be.  By agreeing to a virtual visit, we'd like you to understand that the technology does not allow for your provider to perform an examination, and thus may limit your provider's ability to fully assess your condition.  Finally, though the technology is pretty good, we cannot assure that it will always work on either your or our end, and in the setting of a video visit, we may have to convert it to a phone-only visit.  In either situation, we cannot ensure that we have a secure connection.  Are you willing to proceed?"  2. Give patient instructions for WebEx download to smartphone as below if video visit  3. Advise patient to be prepared with any vital sign or heart rhythm information, their current medicines, and a piece of paper and pen handy for any instructions they may receive the day of their visit  4. Inform patient they will receive a phone call 15 minutes prior to their appointment time (may be from unknown caller ID) so they should be prepared to answer  5. Confirm that appointment type is correct in Epic appointment notes (video vs telephone)    TELEPHONE CALL NOTE  Derek Lowery has been deemed a candidate for a follow-up tele-health visit to limit community exposure during the Covid-19 pandemic. I spoke with the patient via phone to ensure availability of phone/video source, confirm preferred email & phone  number, and discuss instructions and expectations.  I reminded Derek Lowery to be prepared with any vital sign and/or heart rhythm information that could potentially be obtained via home monitoring, at the time of his visit. I reminded Derek Lowery to expect a phone call at the time of his visit if his visit.  Did the patient verbally acknowledge consent to treatment?   [name], [place] 11/24/2018 2:16 PM   DOWNLOADING THE Derek Lowery  - If Apple, go to [employer] and type in WebEx in the search bar. [place] [employer], the blue/Tana Trefry circle. The app is free but as with any other app downloads, their phone may require them to verify saved payment information or Apple password. The patient does NOT have to create an account.  - If Android, ask patient to go to [employer] and type in WebEx in the search bar. [place] [employer], the blue/Nyjae Hodge circle. The app is free but as with any other app downloads, their phone may require them to verify saved payment information or Android password. The patient does NOT have to create an account.   CONSENT FOR TELE-HEALTH VISIT - PLEASE REVIEW  I hereby voluntarily request, consent and authorize [facility] and its employed or contracted physicians, physician assistants, nurse practitioners or other licensed health care professionals (the Practitioner), to provide me with telemedicine health care services (the "Services") as deemed necessary  by the treating Practitioner. I acknowledge and consent to receive the Services by the Practitioner via telemedicine. I understand that the telemedicine visit will involve communicating with the Practitioner through live audiovisual communication technology and the disclosure of certain medical information by electronic transmission. I acknowledge that I have been given the opportunity to request an in-person assessment or other available alternative prior to the  telemedicine visit and am voluntarily participating in the telemedicine visit.  I understand that I have the right to withhold or withdraw my consent to the use of telemedicine in the course of my care at any time, without affecting my right to future care or treatment, and that the Practitioner or I may terminate the telemedicine visit at any time. I understand that I have the right to inspect all information obtained and/or recorded in the course of the telemedicine visit and may receive copies of available information for a reasonable fee.  I understand that some of the potential risks of receiving the Services via telemedicine include:  Marland Kitchen Delay or interruption in medical evaluation due to technological equipment failure or disruption; . Information transmitted may not be sufficient (e.g. poor resolution of images) to allow for appropriate medical decision making by the Practitioner; and/or  . In rare instances, security protocols could fail, causing a breach of personal health information.  Furthermore, I acknowledge that it is my responsibility to provide information about my medical history, conditions and care that is complete and accurate to the best of my ability. I acknowledge that Practitioner's advice, recommendations, and/or decision may be based on factors not within their control, such as incomplete or inaccurate data provided by me or distortions of diagnostic images or specimens that may result from electronic transmissions. I understand that the practice of medicine is not an exact science and that Practitioner makes no warranties or guarantees regarding treatment outcomes. I acknowledge that I will receive a copy of this consent concurrently upon execution via email to the email address I last provided but may also request a printed copy by calling the office of [place].    I understand that my insurance will be billed for this visit.   I have read or had this consent read to me.  . I understand the contents of this consent, which adequately explains the benefits and risks of the Services being provided via telemedicine.  . I have been provided ample opportunity to ask questions regarding this consent and the Services and have had my questions answered to my satisfaction. . I give my informed consent for the services to be provided through the use of telemedicine in my medical care  By participating in this telemedicine visit I agree to the above.

## 2018-11-27 ENCOUNTER — Other Ambulatory Visit: Payer: Self-pay | Admitting: Internal Medicine

## 2018-11-27 DIAGNOSIS — Z794 Long term (current) use of insulin: Principal | ICD-10-CM

## 2018-11-27 DIAGNOSIS — E1165 Type 2 diabetes mellitus with hyperglycemia: Secondary | ICD-10-CM

## 2018-11-27 DIAGNOSIS — IMO0002 Reserved for concepts with insufficient information to code with codable children: Secondary | ICD-10-CM

## 2018-11-27 DIAGNOSIS — E118 Type 2 diabetes mellitus with unspecified complications: Principal | ICD-10-CM

## 2018-11-29 ENCOUNTER — Telehealth: Payer: Self-pay | Admitting: Cardiology

## 2018-11-29 NOTE — Telephone Encounter (Signed)
Mychart pending, smartphone, pre reg complete 11/29/18 AF

## 2018-12-01 ENCOUNTER — Encounter: Payer: Self-pay | Admitting: Cardiology

## 2018-12-01 NOTE — Progress Notes (Signed)
Virtual Visit via Video Note   This visit type was conducted due to national recommendations for restrictions regarding the COVID-19 Pandemic (e.g. social distancing) in an effort to limit this patient's exposure and mitigate transmission in our community.  Due to his co-morbid illnesses, this patient is at least at moderate risk for complications without adequate follow up.  This format is felt to be most appropriate for this patient at this time.  All issues noted in this document were discussed and addressed.  A limited physical exam was performed with this format.  Please refer to the patient's chart for his consent to telehealth for [facility].   Evaluation Performed:  Follow-up visit  Date:  12/02/2018   ID:  Derek Lowery, DOB 11/05/1955, MRN [id number]  Patient Location: Home Provider Location: Home  PCP:  Ladell Pier, MD  Cardiologist:  Skeet Latch, MD  Electrophysiologist:  None   Chief Complaint:  CAD  History of Present Illness:    Derek Lowery is a 64 y.o. male with a history of coronary artery disease.  I am seeing him because of an abnormal EKG.  He has previously been seen by Dr. Lia Foyer in 2013.   He has a history of CAD.   EF was 60 - 65% on echo in February.    In 2012 he had cardiac catheterization with 60% diagonal stenosis, distal LAD 60% stenosis, OM 2 with an occluded superior branch and inferior 90% branch stenosis.  He had DES stenting to the OM 2 inferior branch.  His EF was well-preserved and he was managed medically otherwise.  He was not seen again until last year when he was seen prior to getting gallbladder surgery.  Was seen in the hospital.  He had right bundle branch block.  He had a normal echocardiogram.  There were no other significant abnormalities at that time.  He has since done well.  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  He denies any chest pressure, neck or arm discomfort.  He denies any  palpitations, presyncope or syncope.  He has no shortness of breath, PND or orthopnea.  He has had no weight gain or edema.  The patient does not have symptoms concerning for COVID-19 infection (fever, chills, cough, or new shortness of breath).    Past Medical History:  Diagnosis Date   Arthritis    CAD (coronary artery disease)    NSTEMI 06/2011:  LHC 07/18/11: Proximal diagonal 60%, distal LAD with a diabetic appearance and 60% stenosis, OM2 with an occluded superior branch and an inferior branch with 90%, EF 55% with inferior hypokinesis.  PCI: Promus DES to the OM2 inferior branch.  This vessel provides collaterals to the superior branch which remained occluded.  Echocardiogram 07/18/11: EF 60%, normal wall motion.   Cataract    Essential hypertension, benign    Glaucoma    Hypercholesteremia    Internal hemorrhoids    Noncompliance    Type 2 diabetes mellitus ([place]) 1990   Past Surgical History:  Procedure Laterality Date   CHOLECYSTECTOMY N/A 09/21/2017   Procedure: LAPAROSCOPIC CHOLECYSTECTOMY WITH INTRAOPERATIVE CHOLANGIOGRAM;  Surgeon: Michael Boston, MD;  Location: [facility] ORS;  Service: General;  Laterality: N/A;   COLONOSCOPY  2000   Dr. Collene Mares   EYE SURGERY     IRRIGATION AND DEBRIDEMENT ABSCESS Left 05/05/2014   Procedure: IRRIGATION AND DEBRIDEMENT ABSCESS left buttock;  Surgeon: Michael Boston, MD;  Location: [facility];  Service: General;  Laterality: Left;   LEFT HEART CATHETERIZATION WITH CORONARY ANGIOGRAM N/A 07/18/2011   Procedure: LEFT HEART CATHETERIZATION WITH CORONARY ANGIOGRAM;  Surgeon: Hillary Bow, MD;  Location[facility] C[facility] LAB;  Service: Cardiovascular;  Laterality: N/A;   PERCUTANEOUS CORONARY STENT INTERVENTION (PCI-S)  07/18/2011   Procedure: PERCUTANEOUS CORONARY STENT INTERVENTION (PCI-S);  Surgeon: Hillary Bow, MD;  Location: [facility] LAB;  Service: Cardiovascular;;    Prior to Admission medications   Medication Sig Start Date End Date Taking?  Authorizing Provider  aspirin EC 81 MG tablet Take 1 tablet (81 mg total) by mouth daily. 03/25/16  Yes Langeland, Dawn T, MD  atorvastatin (LIPITOR) 40 MG tablet TAKE 1 TABLET BY MOUTH EVERY DAY FOR CHOLESTEROL 11/02/18  Yes Ladell Pier, MD  blood glucose meter kit and supplies KIT Dispense based on patient and insurance preference. Use up to four times daily as directed. One Touch Verio 09/24/18  Yes Ladell Pier, MD  brimonidine (ALPHAGAN) 0.2 % ophthalmic solution INSTILL 1 DROP INTO RIGHT EYE TWICE A DAY 07/23/18  Yes [provider]  dorzolamide (TRUSOPT) 2 % ophthalmic solution Place 1 drop into the right eye 2 (two) times daily.   Yes [provider]  glucose blood (ACCU-CHEK AVIVA PLUS) test strip Use as instructed for 3 times daily testing of blood sugar. E11.9 10/20/17  Yes Ladell Pier, MD  glucose monitoring kit (FREESTYLE) monitoring kit 1 each by Does not apply route as needed for other. 06/24/18  Yes Ladell Pier, MD  hydrochlorothiazide (HYDRODIURIL) 12.5 MG tablet TAKE 1 TABLET BY MOUTH ON M/W/F AND SAT 09/17/18  Yes Ladell Pier, MD  Insulin Glargine (LANTUS SOLOSTAR) 100 UNIT/ML Solostar Pen Inject 23 Units into the skin daily. 06/24/18  Yes Ladell Pier, MD  insulin lispro (HUMALOG KWIKPEN) 100 UNIT/ML KwikPen 8 units TID with meals 06/24/18  Yes Ladell Pier, MD  Insulin Pen Needle (BD PEN NEEDLE NANO U/F) 32G X 4 MM MISC USE AS DIRECTED 3 TIMES A DAY 11/29/18  Yes Ladell Pier, MD  Lancets MISC Use as directed.  Accu chek 2 12/21/17  Yes Ladell Pier, MD  lisinopril-hydrochlorothiazide (PRINZIDE,ZESTORETIC) 10-12.5 MG tablet TAKE 1 TABLET BY MOUTH EVERY DAY 10/15/18  Yes Ladell Pier, MD  Multiple Vitamins-Minerals (MULTIVITAMIN WITH MINERALS) tablet Take 1 tablet by mouth daily.   Yes [provider]  timolol (BETIMOL) 0.5 % ophthalmic solution Place 1 drop into the right eye 2 (two) times daily.   Yes  [provider]  ammonium lactate (LAC-HYDRIN) 12 % lotion Apply to lower legs daily for dry skin Patient not taking: Reported on 09/24/2018 03/23/18   Ladell Pier, MD    Allergies:   Patient has no known allergies.   Social History   Tobacco Use   Smoking status: Never Smoker   Smokeless tobacco: Never Used  Substance Use Topics   Alcohol use: No   Drug use: No     Family Hx: The patient's family history includes Coronary artery disease in his father; Diabetes in his brother, father, and sister; Heart failure in his mother; Hypertension in his mother; Kidney disease in his father; Melanoma in his father; Rectal cancer in his father. There is no history of Colon cancer, Esophageal cancer, or Stomach cancer.  ROS:   Please see the history of present illness.    As stated in the HPI and negative for all other systems.   Prior  CV studies:   The following studies were reviewed today:  Echo 2019  Labs/Other Tests and Data Reviewed:    EKG:  09/19/17   normal sinus rhythm, rate 85, low voltage in the limb and chest leads, right bundle branch block  Recent Labs: 04/11/2018: ALT 21; Hemoglobin 13.8; Platelets 171 06/24/2018: BUN 20; Creatinine, Ser 1.21; Potassium 4.5; Sodium 137   Recent Lipid Panel Lab Results  Component Value Date/Time   CHOL 111 03/23/2018 11:24 AM   TRIG 229 (H) 03/23/2018 11:24 AM   HDL 30 (L) 03/23/2018 11:24 AM   CHOLHDL 3.7 03/23/2018 11:24 AM   CHOLHDL 3.2 11/29/2015 09:36 AM   LDLCALC 35 03/23/2018 11:24 AM    Wt Readings from Last 3 Encounters:  12/02/18 236 lb (107 kg)  09/24/18 239 lb 6.4 oz (108.6 kg)  06/24/18 236 lb 3.2 oz (107.1 kg)     Objective:    Vital Signs:  BP (!) 157/79    Wt 236 lb (107 kg)    BMI 33.86 kg/m    Well nourished, well developed male in no acute distress.   ASSESSMENT & PLAN:    CAD: The patient has no chest discomfort consistent with his previous angina.  This was actually pain in his  armpits.  He can continue with risk reduction.  No change in therapy.  HTN: His blood pressure is slightly elevated but this seems to be somewhat unusual.  He will keep a blood pressure diary and might need further med titration.  DM: I do note that his A1c was 8.2 which was better than previous.  It had been 9.5.  He might be an excellent candidate for our COORDINATE diabetes trial and I will send this note off to Dr. Oval Linsey to consider.   COVID-19 Education: The signs and symptoms of COVID-19 were discussed with the patient and how to seek care for testing (follow up with PCP or arrange E-visit).  The importance of social distancing was discussed today.  Time:   Today, I have spent 20 minutes with the patient with telehealth technology discussing the above problems.     Medication Adjustments/Labs and Tests Ordered: Current medicines are reviewed at length with the patient today.  Concerns regarding medicines are outlined above.   Tests Ordered: No orders of the defined types were placed in this encounter.   Medication Changes: No orders of the defined types were placed in this encounter.   Disposition:  Follow up Dr. Oval Linsey in 6 months.   Signed, Minus Breeding, MD  12/02/2018 11:08 AM    [place]

## 2018-12-02 ENCOUNTER — Encounter: Payer: Self-pay | Admitting: Cardiology

## 2018-12-02 ENCOUNTER — Telehealth (INDEPENDENT_AMBULATORY_CARE_PROVIDER_SITE_OTHER): Payer: Medicare Other | Admitting: Cardiology

## 2018-12-02 VITALS — BP 157/79 | Wt 236.0 lb

## 2018-12-02 DIAGNOSIS — Z794 Long term (current) use of insulin: Secondary | ICD-10-CM

## 2018-12-02 DIAGNOSIS — I2510 Atherosclerotic heart disease of native coronary artery without angina pectoris: Secondary | ICD-10-CM

## 2018-12-02 DIAGNOSIS — Z7189 Other specified counseling: Secondary | ICD-10-CM

## 2018-12-02 DIAGNOSIS — E118 Type 2 diabetes mellitus with unspecified complications: Secondary | ICD-10-CM

## 2018-12-02 DIAGNOSIS — I4510 Unspecified right bundle-branch block: Secondary | ICD-10-CM

## 2018-12-07 DIAGNOSIS — E113553 Type 2 diabetes mellitus with stable proliferative diabetic retinopathy, bilateral: Secondary | ICD-10-CM | POA: Diagnosis not present

## 2018-12-07 DIAGNOSIS — Z961 Presence of intraocular lens: Secondary | ICD-10-CM | POA: Diagnosis not present

## 2018-12-07 DIAGNOSIS — Z794 Long term (current) use of insulin: Secondary | ICD-10-CM | POA: Diagnosis not present

## 2018-12-07 DIAGNOSIS — H4089 Other specified glaucoma: Secondary | ICD-10-CM | POA: Diagnosis not present

## 2018-12-07 DIAGNOSIS — H4052X3 Glaucoma secondary to other eye disorders, left eye, severe stage: Secondary | ICD-10-CM | POA: Diagnosis not present

## 2018-12-09 NOTE — Patient Instructions (Signed)
Medication Instructions:  Continue current medications  If you need a refill on your cardiac medications before your next appointment, please call your pharmacy.  Labwork: None Ordered   Testing/Procedures: None Ordered  Follow-Up: You will need a follow up appointment in 6 months.  Please call our office 2 months in advance to schedule this appointment.  You may see Skeet Latch, MD or one of the following Advanced Practice Providers on your designated Care Team:   Kerin Ransom, PA-C Roby Lofts, [place] . Sande Rives, PA-C    , Jory Sims, DNP, AACC   At [facility], you and your health needs are our priority.  As part of our continuing mission to provide you with exceptional heart care, we have created designated Provider Care Teams.  These Care Teams include your primary Cardiologist (physician) and Advanced Practice Providers (APPs -  Physician Assistants and Nurse Practitioners) who all work together to provide you with the care you need, when you need it.  Thank you for choosing [facility] at [facility]!!

## 2018-12-16 DIAGNOSIS — H43811 Vitreous degeneration, right eye: Secondary | ICD-10-CM | POA: Diagnosis not present

## 2018-12-16 DIAGNOSIS — H4050X3 Glaucoma secondary to other eye disorders, unspecified eye, severe stage: Secondary | ICD-10-CM | POA: Diagnosis not present

## 2018-12-16 DIAGNOSIS — E113511 Type 2 diabetes mellitus with proliferative diabetic retinopathy with macular edema, right eye: Secondary | ICD-10-CM | POA: Diagnosis not present

## 2018-12-16 DIAGNOSIS — H35371 Puckering of macula, right eye: Secondary | ICD-10-CM | POA: Diagnosis not present

## 2018-12-16 DIAGNOSIS — H3561 Retinal hemorrhage, right eye: Secondary | ICD-10-CM | POA: Diagnosis not present

## 2019-01-03 ENCOUNTER — Encounter: Payer: Self-pay | Admitting: Internal Medicine

## 2019-01-03 ENCOUNTER — Other Ambulatory Visit: Payer: Self-pay

## 2019-01-03 ENCOUNTER — Ambulatory Visit: Payer: Medicare Other | Attending: Internal Medicine | Admitting: Internal Medicine

## 2019-01-03 DIAGNOSIS — I10 Essential (primary) hypertension: Secondary | ICD-10-CM | POA: Diagnosis not present

## 2019-01-03 DIAGNOSIS — E11319 Type 2 diabetes mellitus with unspecified diabetic retinopathy without macular edema: Secondary | ICD-10-CM | POA: Diagnosis not present

## 2019-01-03 DIAGNOSIS — E1142 Type 2 diabetes mellitus with diabetic polyneuropathy: Secondary | ICD-10-CM | POA: Diagnosis not present

## 2019-01-03 DIAGNOSIS — I2510 Atherosclerotic heart disease of native coronary artery without angina pectoris: Secondary | ICD-10-CM | POA: Diagnosis not present

## 2019-01-03 DIAGNOSIS — E785 Hyperlipidemia, unspecified: Secondary | ICD-10-CM

## 2019-01-03 MED ORDER — ATORVASTATIN CALCIUM 40 MG PO TABS: ORAL_TABLET | ORAL | 3 refills | Status: DC

## 2019-01-03 MED ORDER — HYDROCHLOROTHIAZIDE 12.5 MG PO TABS: 12.5000 mg | ORAL_TABLET | Freq: Every day | ORAL | 3 refills | Status: DC

## 2019-01-03 NOTE — [place] Notes (Signed)
Virtual Visit via Telephone Note Due to current restrictions/limitations of in-office visits due to the COVID-19 pandemic, this scheduled clinical appointment was converted to a telehealth visit  I connected with Derek Lowery on 01/03/19 at 9:39 a.m by telephone and verified that I am speaking with the correct person using two identifiers. I am in my office.  The patient is at home.  Only the patient and myself participated in this encounter.  I discussed the limitations, risks, security and privacy concerns of performing an evaluation and management service by telephone and the availability of in person appointments. I also discussed with the patient that there may be a patient responsible charge related to this service. The patient expressed understanding and agreed to proceed.   History of Present Illness: hx of DM type 2 with retinopathy(laser treatments by Dr. Beather Arbour neuropathy, and microalbuminuria,HTN, CAD with stent OM2 in 2012, glaucoma (blind LT eye) and HL.  Last seen 09/2018.   DM: did get DM kit but not sure how to use it.   Med:  Reports compliance with Lantus 24 units daily and Humalog 8 units TID Eating habits:  Doing better; eating less fast foods.  Drinks mainly water and occasional a Sprite Zero. Saw podiatry 09/2018.  + Pre-ulcerative callous. Nails were clipped.  Patient states that diabetic shoes were ordered but he has not received or heard anything further about them.  He has a follow-up with the podiatrist again later this month. Saw Dr. Schuyler Amor and [place] last mth in f/u.  HTN/CAD:  Home BP this a.m was 123/76, P 68.  Checks BP once a wk -limits salt in foods -compliant with meds including ASA and Lipitor -Saw Dr. Percival Spanish 12/02/2018.   No CP/SOB LE edema better with the added dose of HCTZ from last visit  Outpatient Encounter Medications as of 01/03/2019  Medication Sig  . ammonium lactate (LAC-HYDRIN) 12 % lotion Apply to lower legs daily for dry  skin (Patient not taking: Reported on 09/24/2018)  . aspirin EC 81 MG tablet Take 1 tablet (81 mg total) by mouth daily.  Marland Kitchen atorvastatin (LIPITOR) 40 MG tablet Take 1 tablet daily  . blood glucose meter kit and supplies KIT Dispense based on patient and insurance preference. Use up to four times daily as directed. One Touch Verio  . brimonidine (ALPHAGAN) 0.2 % ophthalmic solution INSTILL 1 DROP INTO RIGHT EYE TWICE A DAY  . dorzolamide (TRUSOPT) 2 % ophthalmic solution Place 1 drop into the right eye 2 (two) times daily.  Marland Kitchen glucose blood (ACCU-CHEK AVIVA PLUS) test strip Use as instructed for 3 times daily testing of blood sugar. E11.9  . glucose monitoring kit (FREESTYLE) monitoring kit 1 each by Does not apply route as needed for other.  . hydrochlorothiazide (HYDRODIURIL) 12.5 MG tablet Take 1 tablet (12.5 mg total) by mouth daily.  . Insulin Glargine (LANTUS SOLOSTAR) 100 UNIT/ML Solostar Pen Inject 23 Units into the skin daily.  . insulin lispro (HUMALOG KWIKPEN) 100 UNIT/ML KwikPen 8 units TID with meals  . Insulin Pen Needle (BD PEN NEEDLE NANO U/F) 32G X 4 MM MISC USE AS DIRECTED 3 TIMES A DAY  . Lancets MISC Use as directed.  Accu chek 2  . lisinopril-hydrochlorothiazide (PRINZIDE,ZESTORETIC) 10-12.5 MG tablet TAKE 1 TABLET BY MOUTH EVERY DAY  . Multiple Vitamins-Minerals (MULTIVITAMIN WITH MINERALS) tablet Take 1 tablet by mouth daily.  . timolol (BETIMOL) 0.5 % ophthalmic solution Place 1 drop into the right eye 2 (two) times daily.  . [  DISCONTINUED] atorvastatin (LIPITOR) 40 MG tablet TAKE 1 TABLET BY MOUTH EVERY DAY FOR CHOLESTEROL  . [DISCONTINUED] hydrochlorothiazide (HYDRODIURIL) 12.5 MG tablet TAKE 1 TABLET BY MOUTH ON M/W/F AND SAT   No facility-administered encounter medications on file as of 01/03/2019.     Observations/Objective:  Lab Results  Component Value Date   WBC 10.6 (H) 04/11/2018   HGB 13.8 04/11/2018   HCT 40.3 04/11/2018   MCV 84.7 04/11/2018   PLT 171  04/11/2018   Lab Results  Component Value Date   CHOL 111 03/23/2018   HDL 30 (L) 03/23/2018   LDLCALC 35 03/23/2018   TRIG 229 (H) 03/23/2018   CHOLHDL 3.7 03/23/2018     Chemistry      Component Value Date/Time   NA 137 06/24/2018 1218   K 4.5 06/24/2018 1218   CL 99 06/24/2018 1218   CO2 25 06/24/2018 1218   BUN 20 06/24/2018 1218   CREATININE 1.21 06/24/2018 1218   CREATININE 0.80 09/02/2016 0902      Component Value Date/Time   CALCIUM 9.4 06/24/2018 1218   ALKPHOS 62 04/11/2018 0929   AST 27 04/11/2018 0929   ALT 21 04/11/2018 0929   BILITOT 1.6 (H) 04/11/2018 0929   BILITOT 0.7 10/13/2017 1140      Assessment and Plan: 1. DM type 2 with diabetic peripheral neuropathy ([place]) 2. Diabetic retinopathy of both eyes associated with type 2 diabetes mellitus, macular edema presence unspecified, unspecified retinopathy severity ([place]) -Level of control is unknown. -We will have him come in to see a clinical pharmacist later this week to be taught how to use his glucometer.  Patient told to bring his glucometer and other testing supplies with him to that visit.  I would also recommend a subsequent follow-up appointment with the clinical pharmacist in 2 weeks after that by having the patient bring in his blood sugar readings with him  3. Essential hypertension Reported home blood pressure readings are at goal.  Continue current medications and low-salt diet - hydrochlorothiazide (HYDRODIURIL) 12.5 MG tablet; Take 1 tablet (12.5 mg total) by mouth daily.  Dispense: 90 tablet; Refill: 3  4. Coronary artery disease involving native coronary artery of native heart without angina pectoris Clinically stable.  Recently seen by cardiologist.  5. Hyperlipidemia, unspecified hyperlipidemia type - atorvastatin (LIPITOR) 40 MG tablet; Take 1 tablet daily  Dispense: 90 tablet; Refill: 3   Follow Up Instructions: F/u in 3 mth   I discussed the assessment and treatment plan with the  patient. The patient was provided an opportunity to ask questions and all were answered. The patient agreed with the plan and demonstrated an understanding of the instructions.   The patient was advised to call back or seek an in-person evaluation if the symptoms worsen or if the condition fails to improve as anticipated.  I provided 13 minutes of non-face-to-face time during this encounter.   Karle Plumber, MD

## 2019-01-06 ENCOUNTER — Encounter: Payer: Medicare Other | Admitting: Pharmacist

## 2019-01-13 ENCOUNTER — Ambulatory Visit: Payer: Medicaid Other | Admitting: Podiatry

## 2019-01-13 ENCOUNTER — Other Ambulatory Visit: Payer: Self-pay | Admitting: Internal Medicine

## 2019-02-03 DIAGNOSIS — H35371 Puckering of macula, right eye: Secondary | ICD-10-CM | POA: Diagnosis not present

## 2019-02-03 DIAGNOSIS — H3561 Retinal hemorrhage, right eye: Secondary | ICD-10-CM | POA: Diagnosis not present

## 2019-02-03 DIAGNOSIS — E113511 Type 2 diabetes mellitus with proliferative diabetic retinopathy with macular edema, right eye: Secondary | ICD-10-CM | POA: Diagnosis not present

## 2019-02-03 DIAGNOSIS — H43811 Vitreous degeneration, right eye: Secondary | ICD-10-CM | POA: Diagnosis not present

## 2019-02-24 ENCOUNTER — Other Ambulatory Visit: Payer: Self-pay

## 2019-02-24 NOTE — Patient Outreach (Addendum)
[facility]) Care Management  02/24/2019  CINSERE MIZRAHI 1956-07-12 [id number]    Received referral on 02-09-2019 from CMA to follow up with member for complex CM due to DM and HTN.  Called member at preferred number and member answered phone. Introduced self and explained reason for the call. HIPPA identifiers verified.   Explained that [facility] care coordination services, connection to local community resources and personal assistance in managing member's healthcare needs are provided to member without cost. Additional benefits explained as well.   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   Member did state; however, that he has all of his medications and denies problems with transportation and stated his sister provides transportation.  Will send physicians barrier letter to [facility] PCP. Will send successful outreach letter to member and welcome letter to include nurse advice line magnet. Will call member later this month as agreed with member. RN CM contact information provided to member.  Benjamine Mola "ANN" Josiah Lobo, RN-BSN  [facility] Care Management  Community Care Management Coordinator  [phone] [email or web address]

## 2019-03-12 ENCOUNTER — Other Ambulatory Visit: Payer: Self-pay | Admitting: Internal Medicine

## 2019-03-17 ENCOUNTER — Other Ambulatory Visit: Payer: Self-pay

## 2019-03-17 NOTE — Patient Outreach (Signed)
[facility]) Care Management  03/18/2019   DARYL BEEHLER June 23, 1956 [id number]  Subjective:  Today's Vitals   03/17/19 1149  Weight: 239 lb (108.4 kg)  Height: 1.778 m (5' 10")  PainSc: 0-No pain    Objective: 63 y.o. male. 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 ammonium lactate (LAC-HYDRIN) 12 % lotion Apply to lower legs daily for dry skin 400 g 0  . aspirin EC 81 MG tablet Take 1 tablet (81 mg total) by mouth daily. 30 tablet 4  . atorvastatin (LIPITOR) 40 MG tablet Take 1 tablet daily 90 tablet 3  . blood glucose meter kit and supplies KIT Dispense based on patient and insurance preference. Use up to four times daily as directed. One Touch Verio 1 each 0  . brimonidine (ALPHAGAN) 0.2 % ophthalmic solution INSTILL 1 DROP INTO RIGHT EYE TWICE A DAY    . dorzolamide (TRUSOPT) 2 % ophthalmic solution Place 1 drop into the right eye 2 (two) times daily.    Marland Kitchen glucose blood (ACCU-CHEK AVIVA PLUS) test strip Use as instructed for 3 times daily testing of blood sugar. E11.9 100 each 6  . glucose monitoring kit (FREESTYLE) monitoring kit 1 each by Does not apply route as needed for other. 1 each 0  . hydrochlorothiazide (HYDRODIURIL) 12.5 MG tablet Take 1 tablet (12.5 mg total) by mouth daily. 90 tablet 3  . Insulin Glargine (LANTUS SOLOSTAR) 100 UNIT/ML Solostar Pen Inject 23 Units into the skin daily. 15 mL 6  . insulin lispro (HUMALOG KWIKPEN) 100 UNIT/ML KwikPen 8 units TID with meals 15 mL 11  . Insulin Pen Needle (BD PEN NEEDLE NANO U/F) 32G X 4 MM MISC USE AS DIRECTED 3 TIMES A DAY 100 each 11  . Lancets MISC Use as directed.  Accu chek 2 100 each 11  . lisinopril-hydrochlorothiazide (ZESTORETIC) 10-12.5 MG tablet TAKE 1 TABLET BY MOUTH EVERY DAY 90 tablet 0  . Multiple Vitamins-Minerals (MULTIVITAMIN WITH MINERALS)  tablet Take 1 tablet by mouth daily.    . timolol (BETIMOL) 0.5 % ophthalmic solution Place 1 drop into the right eye 2 (two) times daily.     No current facility-administered medications for this visit.     Functional Status:  In your present state of health, do you have any difficulty performing the following activities: 03/17/2019  Hearing? N  Vision? Y  Comment left eye blindness  Difficulty concentrating or making decisions? N  Walking or climbing stairs? Y  Comment uses a cane  Dressing or bathing? N  Doing errands, shopping? Y  Comment sister assists  Preparing Food and eating ? N  Using the Toilet? N  In the past six months, have you accidently leaked urine? N  Do you have problems with loss of bowel control? N  Managing your Medications? Y  Comment sister picks up meds but member able to take own meds  Managing your Finances? Y  Comment sister assists with writing money orders  Housekeeping or managing your Housekeeping? Y  Comment sister assists  Some recent data might be hidden    Fall/Depression Screening: Fall Risk  03/17/2019 09/24/2018 10/13/2017  Falls in the past year? 0 0 No  Number falls in past yr: 0 - -  Risk for fall due to : - - Impaired balance/gait   PHQ 2/9 Scores 03/17/2019 09/24/2018  03/23/2018 10/20/2017 10/13/2017 06/09/2017 07/21/2016  PHQ - 2 Score 0 0 _0 PHQ- 9 Score - 2 - _1 Assessment: Received referral on 02-09-2019 from CMA requesting to follow up with member for complex CM due to DM and HTN.   Called member at preferred number and member answered phone. Introduced self and explained reason for the call. HIPPA identifiers verified.   Reinforced previous explanation [facility] care coordination services, connection to local community resources and personal assistance in managing member's healthcare needs are provided to member without cost. Additional benefits explained as well.   Member states he lives with his sister Joram Venson and her  "boyfriend". Member states he is able to bathe, dress, feed self, and prepare own meals. Pearl assists with writing money orders for bills, transportation to appointments, picking up medications from pharmacy. Member states own ability to take own medications and gives own insulin; however, his sister helps with eye drops.  HTN: Member states he has BP monitoring equipment and checks BP monthly. Member denies any BP concerns at this time. Member states he does not add salt to foods and is compliant to low salt diet.  DM: Podiatrist: [facility] and member states compliance with appointments every 4 months. Denies foot problems.  Eye: PMH Glaucoma, Left eye blindness. Dr. [name] is eye doctor and Dr. [name] is Retinology Specialist seen every 7 weeks for eye injections per member. Last dilation 6 weeks ago.  Medications: Member states he has all his medications and is able to take own meds. His sister picks medication up from pharmacy. Member denies any concerns.  DM monitoring: Member states he has all DM equipment; however, does not monitor blood glucose levels. Education provided concerning importance to monitor glucose levels and member receptive with education.   Diet: member states non-compliance to DM diet.  Exercise: member states he does not exercise.  When asked how importance is your health to you on scale 0-10, member stated "10". When asked how confident are you with working to improve your health, member stated "6". When asked how ready are you to change to improve your health, member stated "7".  Member stated willingness to work with RN CM to improve HGB A1c level with goal to decrease from 8.2 to <7 in next 3 months. Member stated A1c was assessed at his home by a nurse and was 11.2 about  2 weeks ago; however, last documentation shows A1c 8.2  [facility] CM Care Plan Problem One     Most Recent Value  Care Plan Problem One  Risk for ineffective Management of Therapeutic  Rigimen related to non-compliance with health management of DM as stated by member  Role Documenting the Problem One  Care Management [place] for Problem One  Active  [facility] Long Term Goal   Member will demonstrate knowledge and compliance with treatment regimen by next 60 days as manifested by decrease in A1c as documented A1c is 8.2  [facility]N Long Term Goal Start Date  03/18/19  Interventions for Problem One Long Term Goal  Assessed member for knowledge of DM and treatment regimen,  Assessed if member has all medications,  History of DM taken  [facility] CM Short Term Goal #1   member will check blood sugar levels as ordered within next 30 days as evidenced by verbal report  [facility] CM Short Term Goal #1 Start Date  03/17/19  Interventions for Short Term  Goal #1  Assessed member for compliance of monitoring blood sugars,  Assessed if member has DM equipment,  Educated on compliance of monitoring blood sugars  [facility] CM Short Term Goal #2   member will document blood sugars for next 30 days as manifested by member's verbal report  [facility]N CM Short Term Goal #2 Start Date  03/17/19  Interventions for Short Term Goal #2  Will mail [facility] calendar to [facility] home,  Education provided how and where to document blood sugars in calendar.,  Educated on diet, exercise related to DM      Plan: Member states he did not receive welcome packet and successful outreach letter sent on 02-24-2019. Notification sent to [facility] administrative assistant to request information to be resent to member and to send [facility] calendar as well. Will send copy of initial assessment to PCP to include care plan, medications, fall and depression risk assessment. Will follow up with member next month for DM education.   Benjamine Mola "ANN" Josiah Lobo, RN-BSN  [facility] Care Management  Community Care Management Coordinator  [phone] [email or web address]

## 2019-03-24 DIAGNOSIS — E113511 Type 2 diabetes mellitus with proliferative diabetic retinopathy with macular edema, right eye: Secondary | ICD-10-CM | POA: Diagnosis not present

## 2019-03-24 DIAGNOSIS — H35371 Puckering of macula, right eye: Secondary | ICD-10-CM | POA: Diagnosis not present

## 2019-03-24 DIAGNOSIS — H4050X3 Glaucoma secondary to other eye disorders, unspecified eye, severe stage: Secondary | ICD-10-CM | POA: Diagnosis not present

## 2019-03-24 DIAGNOSIS — H211X1 Other vascular disorders of iris and ciliary body, right eye: Secondary | ICD-10-CM | POA: Diagnosis not present

## 2019-04-06 DIAGNOSIS — E113553 Type 2 diabetes mellitus with stable proliferative diabetic retinopathy, bilateral: Secondary | ICD-10-CM | POA: Diagnosis not present

## 2019-04-06 DIAGNOSIS — Z794 Long term (current) use of insulin: Secondary | ICD-10-CM | POA: Diagnosis not present

## 2019-04-06 DIAGNOSIS — H4052X3 Glaucoma secondary to other eye disorders, left eye, severe stage: Secondary | ICD-10-CM | POA: Diagnosis not present

## 2019-04-06 DIAGNOSIS — H4089 Other specified glaucoma: Secondary | ICD-10-CM | POA: Diagnosis not present

## 2019-04-06 DIAGNOSIS — Z961 Presence of intraocular lens: Secondary | ICD-10-CM | POA: Diagnosis not present

## 2019-04-07 ENCOUNTER — Other Ambulatory Visit: Payer: Self-pay

## 2019-04-07 NOTE — Patient Outreach (Signed)
[place] [facility]) Care Management  04/07/2019  Derek Lowery Jun 11, 1956 [id number]   Called member at preferred number and member answered phone. Introduced self and explained reason for the call. HIPPA identifiers verified.   Member states he has been doing well and member denies any current needs or concerns. Member confirms he received his welcome packet from [facility] and calendar. Member educated to place 24 hr. Nurse advise line magnet on refrigerator to call when needed; however, to call 911 if an emergency. Member states he will sign and mail his written consents. Educated member to the uses of the calendar.  DM: Member states he has not been checking his blood sugar levels as he has trouble removing the needle from his meter. Member states he will discuss on Monday with his PCP concerning new meter and believes his A1c level will be checked at this time as well.  Member states he has meal maintaining his low carb diet and states he has decreased eating bread and has decreased his intake of mashed potatoes. Member states he ate one candy bar last week and this was first candy bar in 3 years per member.  Reinforced DM education and encouraged member to document blood sugars in calendar.   Discussed with member RN CM will transition care to [facility] and reasons explained and member verbalized understanding and agreement. Will send RN Health Coach referral. Will send discipline closure letter to PCP. No other [facility] Care Team members involved in member's care at this time.  Benjamine Mola "ANN" Josiah Lobo, RN-BSN  [facility] Care Management  Community Care Management Coordinator  [phone] [email or web address]  THN CM Care Plan Problem One     Most Recent Value  Care Plan Problem One  Risk for ineffective Management of Therapeutic Rigimen related to non-compliance with health management of DM as stated by member  Role Documenting the Problem One  Care Management  [place] for Problem One  Active  [facility] Long Term Goal   Member will demonstrate knowledge and compliance with treatment regimen by next 60 days as manifested by decrease in A1c as documented A1c is 8.2  THN Long Term Goal Start Date  03/18/19  Interventions for Problem One Long Term Goal  Discussed member's upcoming PCP appointment on Monday [id number] per member  [facility] CM Short Term Goal #1   member will check blood sugar levels as ordered within next 30 days as evidenced by verbal report  THN CM Short Term Goal #1 Start Date  03/17/19  [facility] CM Short Term Goal #1 Met Date  04/07/19  Interventions for Short Term Goal #1  Reinforced education on importance in checking blood sugar levels  THN CM Short Term Goal #2   member will document blood sugars for next 30 days as manifested by member's verbal report  THN CM Short Term Goal #2 Start Date  03/17/19  [facility] CM Short Term Goal #2 Met Date  04/07/19  Interventions for Short Term Goal #2  Assessed if member received [facility] Calendar to document blood sugars,  discussed member's current diet

## 2019-04-11 ENCOUNTER — Ambulatory Visit: Payer: Medicare Other | Admitting: Internal Medicine

## 2019-04-11 ENCOUNTER — Other Ambulatory Visit: Payer: Self-pay

## 2019-04-12 ENCOUNTER — Other Ambulatory Visit: Payer: Self-pay | Admitting: Internal Medicine

## 2019-04-12 DIAGNOSIS — IMO0002 Reserved for concepts with insufficient information to code with codable children: Secondary | ICD-10-CM

## 2019-04-12 DIAGNOSIS — E1165 Type 2 diabetes mellitus with hyperglycemia: Secondary | ICD-10-CM

## 2019-05-06 ENCOUNTER — Other Ambulatory Visit: Payer: Self-pay | Admitting: Internal Medicine

## 2019-05-06 DIAGNOSIS — E1165 Type 2 diabetes mellitus with hyperglycemia: Secondary | ICD-10-CM

## 2019-05-06 DIAGNOSIS — IMO0002 Reserved for concepts with insufficient information to code with codable children: Secondary | ICD-10-CM

## 2019-05-12 ENCOUNTER — Other Ambulatory Visit: Payer: Self-pay

## 2019-05-13 ENCOUNTER — Other Ambulatory Visit: Payer: Self-pay

## 2019-05-13 NOTE — Patient Outreach (Signed)
[facility]) Care Management  05/13/2019  Derek Lowery [birth year] [id number]    RN Health Coach placed call to the patient for initial assessment.  The patient was referred to me by [facility] chamber RN for diabetes.  The patient answered the phone and I explained the reason for the call.  In the middle of talking the patient hung up the phone.  I will send the patient an unsuccessful letter.  Plan: RN Health Coach will send letter. If no response to  letter in ten business days [place] will proceed with case closure.   Derek Arms RN, BSN, [place] Direct Dial:  [phone]  Fax: [phone]

## 2019-05-16 DIAGNOSIS — E113599 Type 2 diabetes mellitus with proliferative diabetic retinopathy without macular edema, unspecified eye: Secondary | ICD-10-CM | POA: Diagnosis not present

## 2019-05-16 DIAGNOSIS — H211X1 Other vascular disorders of iris and ciliary body, right eye: Secondary | ICD-10-CM | POA: Diagnosis not present

## 2019-05-16 DIAGNOSIS — H4050X3 Glaucoma secondary to other eye disorders, unspecified eye, severe stage: Secondary | ICD-10-CM | POA: Diagnosis not present

## 2019-05-16 DIAGNOSIS — E113511 Type 2 diabetes mellitus with proliferative diabetic retinopathy with macular edema, right eye: Secondary | ICD-10-CM | POA: Diagnosis not present

## 2019-05-18 DIAGNOSIS — H4089 Other specified glaucoma: Secondary | ICD-10-CM | POA: Diagnosis not present

## 2019-05-18 DIAGNOSIS — Z794 Long term (current) use of insulin: Secondary | ICD-10-CM | POA: Diagnosis not present

## 2019-05-18 DIAGNOSIS — H4052X3 Glaucoma secondary to other eye disorders, left eye, severe stage: Secondary | ICD-10-CM | POA: Diagnosis not present

## 2019-05-18 DIAGNOSIS — E113553 Type 2 diabetes mellitus with stable proliferative diabetic retinopathy, bilateral: Secondary | ICD-10-CM | POA: Diagnosis not present

## 2019-05-18 DIAGNOSIS — Z961 Presence of intraocular lens: Secondary | ICD-10-CM | POA: Diagnosis not present

## 2019-05-24 ENCOUNTER — Other Ambulatory Visit: Payer: Self-pay

## 2019-05-24 NOTE — Telephone Encounter (Signed)
This encounter was created in error - please disregard.

## 2019-05-24 NOTE — Patient Outreach (Signed)
[place] [facility]) Care Management  05/24/2019  PHILEMON RIEDESEL 1955/12/29 [id number]    Multiple attempts to establish contact with the patient without success. No response from calls or letter mailed to patient.   Plan:  Case is being closed at this time.  [name] RN, BSN, [place] Direct Dial:  [phone]  Fax: [phone]

## 2019-06-28 ENCOUNTER — Other Ambulatory Visit: Payer: Self-pay | Admitting: Internal Medicine

## 2019-06-28 DIAGNOSIS — E1165 Type 2 diabetes mellitus with hyperglycemia: Secondary | ICD-10-CM

## 2019-06-28 DIAGNOSIS — IMO0002 Reserved for concepts with insufficient information to code with codable children: Secondary | ICD-10-CM

## 2019-06-29 ENCOUNTER — Other Ambulatory Visit: Payer: Self-pay | Admitting: Internal Medicine

## 2019-07-04 ENCOUNTER — Other Ambulatory Visit: Payer: Self-pay | Admitting: Internal Medicine

## 2019-07-04 DIAGNOSIS — IMO0002 Reserved for concepts with insufficient information to code with codable children: Secondary | ICD-10-CM

## 2019-07-04 DIAGNOSIS — E1165 Type 2 diabetes mellitus with hyperglycemia: Secondary | ICD-10-CM

## 2019-07-05 DIAGNOSIS — H35371 Puckering of macula, right eye: Secondary | ICD-10-CM | POA: Diagnosis not present

## 2019-07-05 DIAGNOSIS — H43811 Vitreous degeneration, right eye: Secondary | ICD-10-CM | POA: Diagnosis not present

## 2019-07-05 DIAGNOSIS — H4720 Unspecified optic atrophy: Secondary | ICD-10-CM | POA: Diagnosis not present

## 2019-07-05 DIAGNOSIS — H211X1 Other vascular disorders of iris and ciliary body, right eye: Secondary | ICD-10-CM | POA: Diagnosis not present

## 2019-07-05 DIAGNOSIS — E113511 Type 2 diabetes mellitus with proliferative diabetic retinopathy with macular edema, right eye: Secondary | ICD-10-CM | POA: Diagnosis not present

## 2019-07-12 ENCOUNTER — Other Ambulatory Visit: Payer: Self-pay

## 2019-07-12 ENCOUNTER — Ambulatory Visit: Payer: Medicare Other | Attending: Internal Medicine | Admitting: Physician Assistant

## 2019-07-12 VITALS — BP 162/77 | HR 55 | Temp 98.5°F | Resp 18 | Ht 70.0 in | Wt 243.0 lb

## 2019-07-12 DIAGNOSIS — E785 Hyperlipidemia, unspecified: Secondary | ICD-10-CM | POA: Diagnosis not present

## 2019-07-12 DIAGNOSIS — E118 Type 2 diabetes mellitus with unspecified complications: Secondary | ICD-10-CM | POA: Diagnosis not present

## 2019-07-12 DIAGNOSIS — E1142 Type 2 diabetes mellitus with diabetic polyneuropathy: Secondary | ICD-10-CM | POA: Diagnosis not present

## 2019-07-12 DIAGNOSIS — E1165 Type 2 diabetes mellitus with hyperglycemia: Secondary | ICD-10-CM | POA: Diagnosis not present

## 2019-07-12 DIAGNOSIS — Z23 Encounter for immunization: Secondary | ICD-10-CM

## 2019-07-12 DIAGNOSIS — Z794 Long term (current) use of insulin: Secondary | ICD-10-CM | POA: Diagnosis not present

## 2019-07-12 DIAGNOSIS — I2510 Atherosclerotic heart disease of native coronary artery without angina pectoris: Secondary | ICD-10-CM

## 2019-07-12 DIAGNOSIS — I10 Essential (primary) hypertension: Secondary | ICD-10-CM | POA: Diagnosis not present

## 2019-07-12 DIAGNOSIS — IMO0002 Reserved for concepts with insufficient information to code with codable children: Secondary | ICD-10-CM

## 2019-07-12 LAB — GLUCOSE, POCT (MANUAL RESULT ENTRY): POC Glucose: 271 mg/dl — AB (ref 70–99)

## 2019-07-12 LAB — POCT GLYCOSYLATED HEMOGLOBIN (HGB A1C): Hemoglobin A1C: 12.7 % — AB (ref 4.0–5.6)

## 2019-07-12 MED ORDER — LISINOPRIL-HYDROCHLOROTHIAZIDE 20-12.5 MG PO TABS: 1.0000 | ORAL_TABLET | Freq: Every day | ORAL | 3 refills | Status: DC

## 2019-07-12 MED ORDER — INSULIN LISPRO (1 UNIT DIAL) 100 UNIT/ML (KWIKPEN): PEN_INJECTOR | SUBCUTANEOUS | 11 refills | Status: DC

## 2019-07-12 MED ORDER — HYDROCHLOROTHIAZIDE 12.5 MG PO TABS: 12.5000 mg | ORAL_TABLET | Freq: Every day | ORAL | 3 refills | Status: DC

## 2019-07-12 MED ORDER — ASPIRIN EC 81 MG PO TBEC: 81.0000 mg | DELAYED_RELEASE_TABLET | Freq: Every day | ORAL | 4 refills | Status: AC

## 2019-07-12 MED ORDER — BD PEN NEEDLE NANO U/F 32G X 4 MM MISC: 3 refills | Status: DC

## 2019-07-12 MED ORDER — ATORVASTATIN CALCIUM 40 MG PO TABS: ORAL_TABLET | ORAL | 3 refills | Status: DC

## 2019-07-12 MED ORDER — LANTUS SOLOSTAR 100 UNIT/ML ~~LOC~~ SOPN: PEN_INJECTOR | SUBCUTANEOUS | 5 refills | Status: DC

## 2019-07-12 MED FILL — LANTUS SOLOSTAR 100 UNITS/M: 100 | 53 days supply | Qty: 15 | Fill #0

## 2019-07-12 MED FILL — HYDROCHLOROTHIAZIDE 12.5 MG: 12.5 | 90 days supply | Qty: 90 | Fill #0

## 2019-07-12 MED FILL — HUMALOG 100 UNITS/ML KWIKPE: 100 | 63 days supply | Qty: 15 | Fill #0

## 2019-07-12 MED FILL — TRUEPLUS PEN NDL 32GX5/32": 32G X 4 MM | 33 days supply | Qty: 100 | Fill #0

## 2019-07-12 MED FILL — LISINOPRIL-HCTZ 20-12.5 MG: 20-12.5 | 90 days supply | Qty: 90 | Fill #0

## 2019-07-12 NOTE — Progress Notes (Signed)
Derek Lowery, is a 63 y.o. male  KDT:[id number]  DXI:[id number]  DOB - November 07, 1955  Subjective:  Chief Complaint and HPI: Derek Lowery is a 63 y.o. male here today for diabetes check, BP check and labs.  Says he is compliant with med ss and is taking 24 units lantus daily.  Admits to poor diet and eating too much sugar.  He denies concerns or complaints today.   Not checking blood sugars or Bp regularly.   ROS:   Constitutional:  No f/c, No night sweats, No unexplained weight loss. EENT:  No vision changes, No blurry vision, No hearing changes. No mouth, throat, or ear problems.  Respiratory: No cough, No SOB Cardiac: No CP, no palpitations GI:  No abd pain, No N/V/D. GU: No Urinary s/sx Musculoskeletal: No joint pain Neuro: No headache, no dizziness, no motor weakness.  Skin: No rash Endocrine:  No polydipsia. No polyuria.  Psych: Denies SI/HI  No problems updated.  ALLERGIES: No Known Allergies  PAST MEDICAL HISTORY: Past Medical History:  Diagnosis Date  . Arthritis   . CAD (coronary artery disease)    NSTEMI 06/2011:  LHC 07/18/11: Proximal diagonal 60%, distal LAD with a diabetic appearance and 60% stenosis, OM2 with an occluded superior branch and an inferior branch with 90%, EF 55% with inferior hypokinesis.  PCI: Promus DES to the OM2 inferior branch.  This vessel provides collaterals to the superior branch which remained occluded.  Echocardiogram 07/18/11: EF 60%, normal wall motion.  . Cataract   . Essential hypertension, benign   . Glaucoma   . Hypercholesteremia   . Internal hemorrhoids   . Noncompliance   . Type 2 diabetes mellitus ([place]) 1990    MEDICATIONS AT HOME: Prior to Admission medications   Medication Sig Start Date End Date Taking? Authorizing Provider  ammonium lactate (LAC-HYDRIN) 12 % lotion Apply to lower legs daily for dry skin 03/23/18  Yes Ladell Pier, MD  aspirin EC 81 MG tablet Take 1 tablet (81 mg total) by mouth daily.  07/12/19  Yes Freeman Caldron M, PA-C  atorvastatin (LIPITOR) 40 MG tablet Take 1 tablet daily 07/12/19  Yes McClung, Angela M, PA-C  blood glucose meter kit and supplies KIT Dispense based on patient and insurance preference. Use up to four times daily as directed. One Touch Verio 09/24/18  Yes Ladell Pier, MD  brimonidine (ALPHAGAN) 0.2 % ophthalmic solution INSTILL 1 DROP INTO RIGHT EYE TWICE A DAY 07/23/18  Yes [provider]  dorzolamide (TRUSOPT) 2 % ophthalmic solution Place 1 drop into the right eye 2 (two) times daily.   Yes [provider]  glucose blood (ACCU-CHEK AVIVA PLUS) test strip Use as instructed for 3 times daily testing of blood sugar. E11.9 10/20/17  Yes Ladell Pier, MD  glucose monitoring kit (FREESTYLE) monitoring kit 1 each by Does not apply route as needed for other. 06/24/18  Yes Ladell Pier, MD  hydrochlorothiazide (HYDRODIURIL) 12.5 MG tablet Take 1 tablet (12.5 mg total) by mouth daily. 07/12/19  Yes McClung, Angela M, PA-C  Insulin Glargine (LANTUS SOLOSTAR) 100 UNIT/ML Solostar Pen Inject 28 units into the skin daily. 07/12/19  Yes Freeman Caldron M, PA-C  insulin lispro (HUMALOG KWIKPEN) 100 UNIT/ML KwikPen 8 units TID with meals 07/12/19  Yes McClung, Angela M, PA-C  Insulin Pen Needle (BD PEN NEEDLE NANO U/F) 32G X 4 MM MISC USE AS DIRECTED 3 TIMES A DAY. Please schedule an appointment for additional refills. 07/12/19  Yes Argentina Donovan, PA-C  Lancets MISC Use as directed.  Accu chek 2 12/21/17  Yes Ladell Pier, MD  Multiple Vitamins-Minerals (MULTIVITAMIN WITH MINERALS) tablet Take 1 tablet by mouth daily.   Yes [provider]  timolol (BETIMOL) 0.5 % ophthalmic solution Place 1 drop into the right eye 2 (two) times daily.   Yes [provider]  lisinopril-hydrochlorothiazide (ZESTORETIC) 20-12.5 MG tablet Take 1 tablet by mouth daily. 07/12/19   Argentina Donovan, PA-C     Objective:  EXAM:    Vitals:   07/12/19 0936  BP: (!) 162/77  Pulse: (!) 55  Resp: 18  Temp: 98.5 F (36.9 C)  TempSrc: Oral  SpO2: 99%  Weight: 243 lb (110.2 kg)  Height: 5' 10"  (1.778 m)    General appearance : A&OX3. NAD. Non-toxic-appearing.  Uses a walker;  Appears to be in poor healyh.  overweight HEENT: Atraumatic and Normocephalic.  PERRLA. EOM intact.  Neck: supple, no JVD. No cervical lymphadenopathy. No thyromegaly Chest/Lungs:  Breathing-non-labored, Good air entry bilaterally, breath sounds normal without rales, rhonchi, or wheezing  CVS: S1 S2 regular, no murmurs, gallops, rubs  Extremities: Bilateral Lower Ext shows no edema, both legs are warm to touch with = pulse throughout Neurology:  CN II-XII grossly intact, Non focal.   Psych:  TP linear. J/I WNL. Normal speech. Appropriate eye contact and affect.  Skin:  No Rash  Data Review Lab Results  Component Value Date   HGBA1C 8.2 (A) 09/24/2018   HGBA1C 9.5 (A) 06/24/2018   HGBA1C 7.9 (A) 03/23/2018     Assessment & Plan   1. DM type 2 with diabetic peripheral neuropathy (HCC) Uncontrolled.  Work on diet, exercise, increase water, eliminate sugar and white carbohydrates.  A1C=12.7 today - Glucose (CBG) - HgB A1c  2. Uncontrolled type 2 diabetes mellitus with complication, with long-term current use of insulin (HCC) Uncontrolled.  Work on diet, exercise, increase water, eliminate sugar and white carbohydrates.  A1C=12.7 today Increase dose from 24 to 28.  Check blood sugars fasting and at bedtime and follow-up with luke in 3 weeks- Insulin Glargine (LANTUS SOLOSTAR) 100 UNIT/ML Solostar Pen; Inject 28 units into the skin daily.  Dispense: 15 mL; Refill: 5 - insulin lispro (HUMALOG KWIKPEN) 100 UNIT/ML KwikPen; 8 units TID with meals  Dispense: 15 mL; Refill: 11 - Insulin Pen Needle (BD PEN NEEDLE NANO U/F) 32G X 4 MM MISC; USE AS DIRECTED 3 TIMES A DAY. Please schedule an appointment for additional refills.  Dispense: 100 each;  Refill: 3 - Comprehensive metabolic panel - Lipid panel - CBC with Differential  3. Essential hypertension Not at goal-will increase lisinopril component.  Check BP daily and record-f/up Luke in 3 weeks - lisinopril-hydrochlorothiazide (ZESTORETIC) 20-12.5 MG tablet; Take 1 tablet by mouth daily.  Dispense: 90 tablet; Refill: 3 - hydrochlorothiazide (HYDRODIURIL) 12.5 MG tablet; Take 1 tablet (12.5 mg total) by mouth daily.  Dispense: 90 tablet; Refill: 3 - CBC with Differential  4. Atherosclerosis of native coronary artery of native heart without angina pectoris - aspirin EC 81 MG tablet; Take 1 tablet (81 mg total) by mouth daily.  Dispense: 30 tablet; Refill: 4  5. Hyperlipidemia, unspecified hyperlipidemia type - atorvastatin (LIPITOR) 40 MG tablet; Take 1 tablet daily  Dispense: 90 tablet; Refill: 3 - Lipid panel     Patient have been counseled extensively about nutrition and exercise  Return in about 3 weeks (around 08/02/2019) for telephone visit with Luke-BP/DM;  3  months with PCP .  The patient was given clear instructions to go to ER or return to medical center if symptoms don't improve, worsen or new problems develop. The patient verbalized understanding. The patient was told to call to get lab results if they haven't heard anything in the next week.     Freeman Caldron, PA-C [facility] [place], [place]   07/12/2019, 9:57 AMPatient ID: MARGUIS MATHIESON, male   DOB: 04-29-1956, 63 y.o.   MRN: [id number]

## 2019-07-12 NOTE — Progress Notes (Signed)
Patient ID: Derek Lowery, male   DOB: 1956-08-01, 63 y.o.   MRN: [id number]

## 2019-07-12 NOTE — Patient Instructions (Signed)
Check blood sugars fasting and at bedtime daily and record.  Eat less sugar and carbohydrates.  Drink more water  Check blood pressure daily and record

## 2019-07-13 LAB — COMPREHENSIVE METABOLIC PANEL
ALT: 14 IU/L (ref 0–44)
AST: 18 IU/L (ref 0–40)
Albumin/Globulin Ratio: 1.5 (ref 1.2–2.2)
Albumin: 3.9 g/dL (ref 3.8–4.8)
Alkaline Phosphatase: 67 IU/L (ref 39–117)
BUN/Creatinine Ratio: 22 (ref 10–24)
BUN: 25 mg/dL (ref 8–27)
Bilirubin Total: 0.6 mg/dL (ref 0.0–1.2)
CO2: 25 mmol/L (ref 20–29)
Calcium: 10 mg/dL (ref 8.6–10.2)
Chloride: 102 mmol/L (ref 96–106)
Creatinine, Ser: 1.14 mg/dL (ref 0.76–1.27)
GFR calc Af Amer: 80 mL/min/{1.73_m2} (ref >59)
GFR calc non Af Amer: 69 mL/min/{1.73_m2} (ref >59)
Globulin, Total: 2.6 g/dL (ref 1.5–4.5)
Glucose: 292 mg/dL — ABNORMAL HIGH (ref 65–99)
Potassium: 5.1 mmol/L (ref 3.5–5.2)
Sodium: 141 mmol/L (ref 134–144)
Total Protein: 6.5 g/dL (ref 6.0–8.5)

## 2019-07-13 LAB — CBC WITH DIFFERENTIAL/PLATELET
Basophils Absolute: 0.1 10*3/uL (ref 0.0–0.2)
Basos: 1 %
EOS (ABSOLUTE): 0.3 10*3/uL (ref 0.0–0.4)
Eos: 5 %
Hematocrit: 39.6 % (ref 37.5–51.0)
Hemoglobin: 12.6 g/dL — ABNORMAL LOW (ref 13.0–17.7)
Immature Grans (Abs): 0 10*3/uL (ref 0.0–0.1)
Immature Granulocytes: 0 %
Lymphocytes Absolute: 1.8 10*3/uL (ref 0.7–3.1)
Lymphs: 27 %
MCH: 28.1 pg (ref 26.6–33.0)
MCHC: 31.8 g/dL (ref 31.5–35.7)
MCV: 88 fL (ref 79–97)
Monocytes Absolute: 0.7 10*3/uL (ref 0.1–0.9)
Monocytes: 11 %
Neutrophils Absolute: 3.7 10*3/uL (ref 1.4–7.0)
Neutrophils: 56 %
Platelets: 138 10*3/uL — ABNORMAL LOW (ref 150–450)
RBC: 4.48 x10E6/uL (ref 4.14–5.80)
RDW: 12.7 % (ref 11.6–15.4)
WBC: 6.6 10*3/uL (ref 3.4–10.8)

## 2019-07-13 LAB — LIPID PANEL
Chol/HDL Ratio: 4.1 ratio (ref 0.0–5.0)
Cholesterol, Total: 122 mg/dL (ref 100–199)
HDL: 30 mg/dL — ABNORMAL LOW (ref >39)
LDL Chol Calc (NIH): 62 mg/dL (ref 0–99)
Triglycerides: 180 mg/dL — ABNORMAL HIGH (ref 0–149)
VLDL Cholesterol Cal: 30 mg/dL (ref 5–40)

## 2019-07-27 ENCOUNTER — Other Ambulatory Visit: Payer: Self-pay | Admitting: Internal Medicine

## 2019-08-01 ENCOUNTER — Other Ambulatory Visit: Payer: Self-pay

## 2019-08-01 ENCOUNTER — Encounter: Payer: Self-pay | Admitting: Podiatry

## 2019-08-01 ENCOUNTER — Ambulatory Visit (INDEPENDENT_AMBULATORY_CARE_PROVIDER_SITE_OTHER): Payer: Medicare Other | Admitting: Podiatry

## 2019-08-01 DIAGNOSIS — E1142 Type 2 diabetes mellitus with diabetic polyneuropathy: Secondary | ICD-10-CM | POA: Diagnosis not present

## 2019-08-01 DIAGNOSIS — M79674 Pain in right toe(s): Secondary | ICD-10-CM | POA: Diagnosis not present

## 2019-08-01 DIAGNOSIS — L84 Corns and callosities: Secondary | ICD-10-CM | POA: Diagnosis not present

## 2019-08-01 DIAGNOSIS — M79675 Pain in left toe(s): Secondary | ICD-10-CM | POA: Diagnosis not present

## 2019-08-01 DIAGNOSIS — B351 Tinea unguium: Secondary | ICD-10-CM | POA: Diagnosis not present

## 2019-08-01 NOTE — Patient Instructions (Signed)
Diabetes Mellitus and Foot Care Foot care is an important part of your health, especially when you have diabetes. Diabetes may cause you to have problems because of poor blood flow (circulation) to your feet and legs, which can cause your skin to:  Become thinner and drier.  Break more easily.  Heal more slowly.  Peel and crack. You may also have nerve damage (neuropathy) in your legs and feet, causing decreased feeling in them. This means that you may not notice minor injuries to your feet that could lead to more serious problems. Noticing and addressing any potential problems early is the best way to prevent future foot problems. How to care for your feet Foot hygiene  Wash your feet daily with warm water and mild soap. Do not use hot water. Then, pat your feet and the areas between your toes until they are completely dry. Do not soak your feet as this can dry your skin.  Trim your toenails straight across. Do not dig under them or around the cuticle. File the edges of your nails with an emery board or nail file.  Apply a moisturizing lotion or petroleum jelly to the skin on your feet and to dry, brittle toenails. Use lotion that does not contain alcohol and is unscented. Do not apply lotion between your toes. Shoes and socks  Wear clean socks or stockings every day. Make sure they are not too tight. Do not wear knee-high stockings since they may decrease blood flow to your legs.  Wear shoes that fit properly and have enough cushioning. Always look in your shoes before you put them on to be sure there are no objects inside.  To break in new shoes, wear them for just a few hours a day. This prevents injuries on your feet. Wounds, scrapes, corns, and calluses  Check your feet daily for blisters, cuts, bruises, sores, and redness. If you cannot see the bottom of your feet, use a mirror or ask someone for help.  Do not cut corns or calluses or try to remove them with medicine.  If you  find a minor scrape, cut, or break in the skin on your feet, keep it and the skin around it clean and dry. You may clean these areas with mild soap and water. Do not clean the area with peroxide, alcohol, or iodine.  If you have a wound, scrape, corn, or callus on your foot, look at it several times a day to make sure it is healing and not infected. Check for: ? Redness, swelling, or pain. ? Fluid or blood. ? Warmth. ? Pus or a bad smell. General instructions  Do not cross your legs. This may decrease blood flow to your feet.  Do not use heating pads or hot water bottles on your feet. They may burn your skin. If you have lost feeling in your feet or legs, you may not know this is happening until it is too late.  Protect your feet from hot and cold by wearing shoes, such as at the beach or on hot pavement.  Schedule a complete foot exam at least once a year (annually) or more often if you have foot problems. If you have foot problems, report any cuts, sores, or bruises to your health care provider immediately. Contact a health care provider if:  You have a medical condition that increases your risk of infection and you have any cuts, sores, or bruises on your feet.  You have an injury that is not   healing.  You have redness on your legs or feet.  You feel burning or tingling in your legs or feet.  You have pain or cramps in your legs and feet.  Your legs or feet are numb.  Your feet always feel cold.  You have pain around a toenail. Get help right away if:  You have a wound, scrape, corn, or callus on your foot and: ? You have pain, swelling, or redness that gets worse. ? You have fluid or blood coming from the wound, scrape, corn, or callus. ? Your wound, scrape, corn, or callus feels warm to the touch. ? You have pus or a bad smell coming from the wound, scrape, corn, or callus. ? You have a fever. ? You have a red line going up your leg. Summary  Check your feet every day  for cuts, sores, red spots, swelling, and blisters.  Moisturize feet and legs daily.  Wear shoes that fit properly and have enough cushioning.  If you have foot problems, report any cuts, sores, or bruises to your health care provider immediately.  Schedule a complete foot exam at least once a year (annually) or more often if you have foot problems. This information is not intended to replace advice given to you by your health care provider. Make sure you discuss any questions you have with your health care provider. Document Released: 08/01/2000 Document Revised: 09/16/2017 Document Reviewed: 09/05/2016 Elsevier Patient Education  2020 Elsevier Inc.  

## 2019-08-02 ENCOUNTER — Ambulatory Visit: Payer: Medicare Other | Admitting: Pharmacist

## 2019-08-02 ENCOUNTER — Ambulatory Visit: Payer: Medicare Other | Attending: Internal Medicine | Admitting: Pharmacist

## 2019-08-07 DIAGNOSIS — M79674 Pain in right toe(s): Secondary | ICD-10-CM | POA: Insufficient documentation

## 2019-08-07 DIAGNOSIS — B351 Tinea unguium: Secondary | ICD-10-CM | POA: Insufficient documentation

## 2019-08-07 NOTE — Progress Notes (Signed)
Subjective: Derek Lowery is a 63 y.o. y.o. male who presents for preventative diabetic foot care on today. He has h/o chronic callus right foot and painful mycotic toenails b/l.  Pain interferes with daily activities and is relieved with periodic professional debridement.  He voices no new pedal concerns on today's visit.  Ladell Pier, MD is his PCP.   Medications reviewed in chart.  No Known Allergies  Objective: There were no vitals filed for this visit.  Vascular Examination: Capillary refill time <3 seconds b/l.  Dorsalis pedis posterior tibial pulses palpable b/l.  Digital hair absent b/l.  Skin temperature gradient WNL b/l.  Chronic LE edema b/l. No open wounds. No signs of infection noted. No pain with calf compression.  Dermatological Examination: Skin with normal turgor, texture and tone b/l.  Toenails 1-5 b/l discolored, thick, dystrophic with subungual debris and pain with palpation to nailbeds due to thickness of nails.  Hyperkeratotic lesion submet head 5 right foot. No erythema, no edema, no drainage, no flocculence noted.   Musculoskeletal: Muscle strength 5/5 to all LE muscle groups b/l.  Neurological: Sensation  Diminished  b/l with 10 gram monofilament.  Vibratory sensation diminished b/l.  Assessment: 1. Painful onychomycosis toenails 1-5 b/l 2.  Callus submet head 5 right foot 3.  NIDDM with neuropathy  Plan: 1. Continue diabetic foot care principles. Literature dispensed on today. 2. Toenails 1-5 b/l were debrided in length and girth without iatrogenic bleeding. 3.  Callus submet head 5 right foot pared with sterile scalpel blade without incident. 4. Patient to continue soft, supportive shoe gear daily. 5. Patient to report any pedal injuries to medical professional immediately. 6. Follow up 3 months.  7. Patient/POA to call should there be a concern in the interim.

## 2019-08-23 DIAGNOSIS — H211X1 Other vascular disorders of iris and ciliary body, right eye: Secondary | ICD-10-CM | POA: Diagnosis not present

## 2019-08-23 DIAGNOSIS — E113511 Type 2 diabetes mellitus with proliferative diabetic retinopathy with macular edema, right eye: Secondary | ICD-10-CM | POA: Diagnosis not present

## 2019-08-23 DIAGNOSIS — H4050X3 Glaucoma secondary to other eye disorders, unspecified eye, severe stage: Secondary | ICD-10-CM | POA: Diagnosis not present

## 2019-08-27 ENCOUNTER — Other Ambulatory Visit: Payer: Self-pay | Admitting: Internal Medicine

## 2019-08-27 DIAGNOSIS — I10 Essential (primary) hypertension: Secondary | ICD-10-CM

## 2019-08-29 MED ORDER — LISINOPRIL-HYDROCHLOROTHIAZIDE 20-12.5 MG PO TABS: 1.0000 | ORAL_TABLET | Freq: Every day | ORAL | 0 refills | Status: DC

## 2019-09-08 MED FILL — LANTUS SOLOSTAR 100 UNITS/M: 100 | 53 days supply | Qty: 15 | Fill #1

## 2019-09-16 DIAGNOSIS — H4052X3 Glaucoma secondary to other eye disorders, left eye, severe stage: Secondary | ICD-10-CM | POA: Diagnosis not present

## 2019-09-16 DIAGNOSIS — H4089 Other specified glaucoma: Secondary | ICD-10-CM | POA: Diagnosis not present

## 2019-09-26 ENCOUNTER — Other Ambulatory Visit: Payer: Self-pay | Admitting: Internal Medicine

## 2019-09-26 DIAGNOSIS — E1165 Type 2 diabetes mellitus with hyperglycemia: Secondary | ICD-10-CM

## 2019-09-26 DIAGNOSIS — IMO0002 Reserved for concepts with insufficient information to code with codable children: Secondary | ICD-10-CM

## 2019-10-14 ENCOUNTER — Ambulatory Visit: Payer: Medicare Other | Admitting: Internal Medicine

## 2019-10-17 ENCOUNTER — Other Ambulatory Visit: Payer: Self-pay

## 2019-10-17 ENCOUNTER — Encounter: Payer: Self-pay | Admitting: Internal Medicine

## 2019-10-17 ENCOUNTER — Ambulatory Visit: Payer: Medicare Other | Attending: Internal Medicine | Admitting: Internal Medicine

## 2019-10-17 VITALS — BP 158/77 | HR 66 | Temp 98.0°F | Resp 16 | Ht 70.0 in | Wt 244.0 lb

## 2019-10-17 DIAGNOSIS — E1142 Type 2 diabetes mellitus with diabetic polyneuropathy: Secondary | ICD-10-CM | POA: Diagnosis not present

## 2019-10-17 DIAGNOSIS — I10 Essential (primary) hypertension: Secondary | ICD-10-CM | POA: Diagnosis not present

## 2019-10-17 DIAGNOSIS — R600 Localized edema: Secondary | ICD-10-CM

## 2019-10-17 DIAGNOSIS — Z794 Long term (current) use of insulin: Secondary | ICD-10-CM | POA: Diagnosis not present

## 2019-10-17 DIAGNOSIS — E11319 Type 2 diabetes mellitus with unspecified diabetic retinopathy without macular edema: Secondary | ICD-10-CM

## 2019-10-17 DIAGNOSIS — M65331 Trigger finger, right middle finger: Secondary | ICD-10-CM | POA: Diagnosis not present

## 2019-10-17 DIAGNOSIS — I2510 Atherosclerotic heart disease of native coronary artery without angina pectoris: Secondary | ICD-10-CM

## 2019-10-17 LAB — POCT GLYCOSYLATED HEMOGLOBIN (HGB A1C): Hemoglobin A1C: 11.8 % — AB (ref 4.0–5.6)

## 2019-10-17 LAB — GLUCOSE, POCT (MANUAL RESULT ENTRY): POC Glucose: 213 mg/dl — AB (ref 70–99)

## 2019-10-17 MED ORDER — FUROSEMIDE 20 MG PO TABS: 20.0000 mg | ORAL_TABLET | Freq: Every day | ORAL | 3 refills | Status: DC

## 2019-10-17 MED ORDER — LISINOPRIL 20 MG PO TABS: 20.0000 mg | ORAL_TABLET | Freq: Every day | ORAL | 3 refills | Status: DC

## 2019-10-17 MED ORDER — FREESTYLE LIBRE READER DEVI: 1.0000 | Freq: Once | 0 refills | Status: AC

## 2019-10-17 MED ORDER — FREESTYLE LIBRE SENSOR SYSTEM MISC: 12 refills | Status: DC

## 2019-10-17 MED ORDER — LANTUS SOLOSTAR 100 UNIT/ML ~~LOC~~ SOPN: PEN_INJECTOR | SUBCUTANEOUS | 1 refills | Status: DC

## 2019-10-17 MED ORDER — INSULIN LISPRO (1 UNIT DIAL) 100 UNIT/ML (KWIKPEN): PEN_INJECTOR | SUBCUTANEOUS | 11 refills | Status: DC

## 2019-10-17 NOTE — Progress Notes (Signed)
Routine DM check  CGB 213 non fasting  A1C 11.8

## 2019-10-17 NOTE — Progress Notes (Signed)
Patient ID: Derek Lowery, male    DOB: 29-Sep-1955  MRN: [id number]  CC: Diabetes   Subjective: Derek Lowery is a 64 y.o. male who presents for chronic ds management His concerns today include:  hx of DM type 2 with retinopathy(laser treatments by Dr. Beather Arbour neuropathy, and microalbuminuria,HTN, CAD with stent OM2 in 2012, glaucoma (blind LT eye) and HL.  RT middle finger gets stuck when he bends it.  This makes it hard when he holds objects.  He has to use his other hand to release the finger. Started 4-6 wks ago and occurs several times a day.  It is very bothersome to him.Marland Kitchen    DIABETES TYPE 2 Last A1C:   Results for orders placed or performed in visit on 10/17/19  POCT glucose (manual entry)  Result Value Ref Range   POC Glucose 213 (A) 70 - 99 mg/dl  POCT glycosylated hemoglobin (Hb A1C)  Result Value Ref Range   Hemoglobin A1C 11.8 (A) 4.0 - 5.6 %   HbA1c POC (<> result, manual entry)     HbA1c, POC (prediabetic range)     HbA1c, POC (controlled diabetic range)      Med Adherence:  [x]  Yes -Lantus 28 units daily, Humalog 8 units with meals   []  No Medication side effects:  []  Yes    []  No Home Monitoring?  []  Yes    [x]  No -has a meter but has a difficult time putting the lancets in and out of the device. Home glucose results range: Diet Adherence: [x]  Yes  -eating a lot of white starches; not too much sweets.  Drinks water, coffee with Archer Asa as sweetner and diet soda Exercise: []  Yes    [x]  No.he ambulates with a rolling walker.  He denies any falls. Hypoglycemic episodes?: []  Yes    [x]  No Numbness of the feet? [x]  Yes     Retinopathy hx? [x]  Yes    []  No Last eye exam: has appt with [facility] tomorrow and [facility] on 10/29/2019 Comments:   HYPERTENSION/CAD Currently taking: see medication list Med Adherence: [x]  Yes is on lisinopril/HCTZ 20/12.5 mg.  He also takes an additional 12.5 mg of hydrochlorothiazide.  The latter is not on his med list but  patient states he has it at home and has been taking it.  He took his medicines already this morning.    []  No Medication side effects: []  Yes    [x]  No Adherence with salt restriction: []  Yes    [x]  No Home Monitoring?: [x]  Yes    []  No Monitoring Frequency:  Not often Home BP results range: []  Yes    []  No SOB? []  Yes    [x]  No Chest Pain?: []  Yes    [x]  No.  No PND/orthopnea Leg swelling?: [x]  Yes -they go down at nights  []  No Headaches?: []  Yes    [x]  No Dizziness? []  Yes    [x]  No Comments:   Patient Active Problem List   Diagnosis Date Noted  . Pain due to onychomycosis of toenails of both feet 08/07/2019  . Educated about COVID-19 virus infection 12/02/2018  . RBBB 12/02/2018  . Benign paroxysmal positional vertigo 06/25/2018  . Pre-ulcerative corn or callous 10/13/2017  . Anemia 10/13/2017  . Chronic diarrhea 06/09/2017  . Microalbuminuria 03/07/2017  . Dry skin 03/05/2017  . Glaucoma 03/05/2017  . Legally blind 03/05/2017  . Retinopathy due to secondary diabetes ([place]) 02/06/2016  . Diabetic polyneuropathy associated with type  2 diabetes mellitus ([place]) 01/12/2015  . Fournier's gangrene into true pelvis s/p I&D 05/05/2014 05/05/2014  . Type 2 diabetes mellitus with complication, with long-term current use of insulin ([place]) 07/17/2011  . HLD (hyperlipidemia) 07/17/2011  . Coronary artery disease of native artery of native heart with stable angina pectoris ([place]) 07/17/2011  . Essential hypertension 07/17/2011     Current Outpatient Medications on File Prior to Visit  Medication Sig Dispense Refill  . ammonium lactate (LAC-HYDRIN) 12 % lotion Apply to lower legs daily for dry skin 400 g 0  . aspirin EC 81 MG tablet Take 1 tablet (81 mg total) by mouth daily. 30 tablet 4  . atorvastatin (LIPITOR) 40 MG tablet Take 1 tablet daily 90 tablet 3  . blood glucose meter kit and supplies KIT Dispense based on patient and insurance preference. Use up to four times daily as directed.  One Touch Verio 1 each 0  . dorzolamide (TRUSOPT) 2 % ophthalmic solution Place 1 drop into the right eye 2 (two) times daily.    Marland Kitchen glucose blood (ACCU-CHEK AVIVA PLUS) test strip Use as instructed for 3 times daily testing of blood sugar. E11.9 100 each 6  . Insulin Pen Needle (BD PEN NEEDLE NANO U/F) 32G X 4 MM MISC USE AS DIRECTED 3 TIMES A DAY. Please schedule an appointment for additional refills. 100 each 3  . Lancets MISC Use as directed.  Accu chek 2 100 each 11  . latanoprost (XALATAN) 0.005 % ophthalmic solution Place 1 drop into the right eye at bedtime.    . Multiple Vitamins-Minerals (MULTIVITAMIN WITH MINERALS) tablet Take 1 tablet by mouth daily.    . timolol (BETIMOL) 0.5 % ophthalmic solution Place 1 drop into the right eye 2 (two) times daily.    . brimonidine (ALPHAGAN) 0.2 % ophthalmic solution INSTILL 1 DROP INTO RIGHT EYE TWICE A DAY    . glucose monitoring kit (FREESTYLE) monitoring kit 1 each by Does not apply route as needed for other. (Patient not taking: Reported on 10/17/2019) 1 each 0   No current facility-administered medications on file prior to visit.    No Known Allergies  Social History   Socioeconomic History  . Marital status: Single    Spouse name: Not on file  . Number of children: Not on file  . Years of education: Not on file  . Highest education level: Not on file  Occupational History  . Occupation: Theatre stage manager: [employer]  Tobacco Use  . Smoking status: Never Smoker  . Smokeless tobacco: Never Used  Substance and Sexual Activity  . Alcohol use: No  . Drug use: No  . Sexual activity: Never  Other Topics Concern  . Not on file  Social History Narrative   Works at [employer]   NOK=[phone] [place] Strain:   . Difficulty of Paying Living Expenses: Not on file  Food Insecurity: No Food Insecurity  . Worried About Charity fundraiser in the Last Year: Never true  . Ran Out of  Food in the Last Year: Never true  Transportation Needs: No Transportation Needs  . Lack of Transportation (Medical): No  . Lack of Transportation (Non-Medical): No  Physical Activity: Inactive  . Days of Exercise per Week: 0 days  . Minutes of Exercise per Session: 0 min  Stress: No Stress Concern Present  . Feeling of Stress : Not at all  Social Connections: Moderately  Isolated  . Frequency of Communication with Friends and Family: Three times a week  . Frequency of Social Gatherings with Friends and Family: Three times a week  . Attends Religious Services: Never  . Active Member of Clubs or Organizations: No  . Attends Archivist Meetings: Never  . Marital Status: Never married  Intimate Partner Violence: Not At Risk  . Fear of Current or Ex-Partner: No  . Emotionally Abused: No  . Physically Abused: No  . Sexually Abused: No    Family History  Problem Relation Age of Onset  . Coronary artery disease Father        Developed in his 76s  . Kidney disease Father   . Diabetes Father   . Melanoma Father   . Rectal cancer Father   . Heart failure Mother   . Hypertension Mother   . Diabetes Sister   . Diabetes Brother   . Colon cancer Neg Hx   . Esophageal cancer Neg Hx   . Stomach cancer Neg Hx     Past Surgical History:  Procedure Laterality Date  . CHOLECYSTECTOMY N/A 09/21/2017   Procedure: LAPAROSCOPIC CHOLECYSTECTOMY WITH INTRAOPERATIVE CHOLANGIOGRAM;  Surgeon: Michael Boston, MD;  Location: [facility];  Service: General;  Laterality: N/A;  . COLONOSCOPY  2000   Dr. Collene Mares  . EYE SURGERY    . IRRIGATION AND DEBRIDEMENT ABSCESS Left 05/05/2014   Procedure: IRRIGATION AND DEBRIDEMENT ABSCESS left buttock;  Surgeon: Michael Boston, MD;  Location: [facility];  Service: General;  Laterality: Left;  . LEFT HEART CATHETERIZATION WITH CORONARY ANGIOGRAM N/A 07/18/2011   Procedure: LEFT HEART CATHETERIZATION WITH CORONARY ANGIOGRAM;  Surgeon: Hillary Bow, MD;  Location[facility]  CATH LAB;  Service: Cardiovascular;  Laterality: N/A;  . PERCUTANEOUS CORONARY STENT INTERVENTION (PCI-S)  07/18/2011   Procedure: PERCUTANEOUS CORONARY STENT INTERVENTION (PCI-S);  Surgeon: Hillary Bow, MD;  Location: [facility] CATH LAB;  Service: Cardiovascular;;    ROS: Review of Systems Negative except as stated above  PHYSICAL EXAM: BP (!) 158/77 (BP Location: Left Arm, Patient Position: Sitting)   Pulse 66   Temp 98 F (36.7 C)   Resp 16   Ht 5' 10"  (1.778 m)   Wt 244 lb (110.7 kg)   SpO2 99%   BMI 35.01 kg/m   Physical Exam General appearance - alert, older Caucasian male.  Clothes are soiled.  He appears unkept.   Mental status - normal mood, behavior, speech, dress, motor activity, and thought processes Eyes - pupils equal and reactive, extraocular eye movements intact Mouth - mucous membranes moist, pharynx normal without lesions Neck - supple, no significant adenopathy Chest - clear to auscultation, no wheezes, rales or rhonchi, symmetric air entry Heart - normal rate, regular rhythm, normal S1, S2, no murmurs, rubs, clicks or gallops Breasts: He has mild bilateral gynecomastia Extremities -2+ lower extremity edema from the knees down. MSK: No signs of active inflammation of the joints of the fingers.  Grip 5/5 bilaterally.  He has a mild intention tremor bilaterally. Diabetic Foot Exam - Simple   Simple Foot Form Visual Inspection See comments: Yes Sensation Testing See comments: Yes Pulse Check Posterior Tibialis and Dorsalis pulse intact bilaterally: Yes Comments Skin on the dorsum of the foot very dry.  He has decreased sensation on leap exam on the dorsal surface of both feet.  No ulcers or calluses seen.     CMP Latest Ref Rng & Units 07/12/2019 06/24/2018 04/11/2018  Glucose 65 - 99  mg/dL 292(H) 306(H) 163(H)  BUN 8 - 27 mg/dL 25 20 19   Creatinine 0.76 - 1.27 mg/dL 1.14 1.21 0.91  Sodium 134 - 144 mmol/L 141 137 142  Potassium 3.5 - 5.2 mmol/L 5.1 4.5  3.8  Chloride 96 - 106 mmol/L 102 99 105  CO2 20 - 29 mmol/L 25 25 25   Calcium 8.6 - 10.2 mg/dL 10.0 9.4 9.8  Total Protein 6.0 - 8.5 g/dL 6.5 - 8.0  Total Bilirubin 0.0 - 1.2 mg/dL 0.6 - 1.6(H)  Alkaline Phos 39 - 117 IU/L 67 - 62  AST 0 - 40 IU/L 18 - 27  ALT 0 - 44 IU/L 14 - 21   Lipid Panel     Component Value Date/Time   CHOL 122 07/12/2019 1042   TRIG 180 (H) 07/12/2019 1042   HDL 30 (L) 07/12/2019 1042   CHOLHDL 4.1 07/12/2019 1042   CHOLHDL 3.2 11/29/2015 0936   VLDL 19 11/29/2015 0936   LDLCALC 62 07/12/2019 1042    CBC    Component Value Date/Time   WBC 6.6 07/12/2019 1042   WBC 10.6 (H) 04/11/2018 0929   RBC 4.48 07/12/2019 1042   RBC 4.76 04/11/2018 0929   HGB 12.6 (L) 07/12/2019 1042   HCT 39.6 07/12/2019 1042   PLT 138 (L) 07/12/2019 1042   MCV 88 07/12/2019 1042   MCH 28.1 07/12/2019 1042   MCH 29.0 04/11/2018 0929   MCHC 31.8 07/12/2019 1042   MCHC 34.2 04/11/2018 0929   RDW 12.7 07/12/2019 1042   LYMPHSABS 1.8 07/12/2019 1042   MONOABS 1.2 (H) 09/24/2017 0539   EOSABS 0.3 07/12/2019 1042   BASOSABS 0.1 07/12/2019 1042    ASSESSMENT AND PLAN: 1. Type 2 diabetes mellitus with diabetic polyneuropathy, with long-term current use of insulin (HCC) Not at goal. We will try to get him a libre meter since he has problems maneuvering the lancets on a regular meter.  Advised to check blood sugars at least twice a day before meals. Increase Lantus to 34 units daily and Humalog 10 units with meals. Dietary counseling given. - Amb ref to Medical Nutrition Therapy-MNT - Insulin Glargine (LANTUS SOLOSTAR) 100 UNIT/ML Solostar Pen; INJECT 34 UNITS INTO THE SKIN DAILY.  Dispense: 15 mL; Refill: 1 - insulin lispro (HUMALOG KWIKPEN) 100 UNIT/ML KwikPen; 10 units TID with meals  Dispense: 15 mL; Refill: 11 - Continuous Blood Gluc Receiver (FREESTYLE LIBRE READER) DEVI; 1 Device by Does not apply route once for 1 dose.  Dispense: 1 each; Refill: 0 - Continuous Blood  Gluc Sensor (FREESTYLE LIBRE SENSOR SYSTEM) MISC; Change sensor Q 2 wks  Dispense: 2 each; Refill: 12  2. Coronary artery disease of native artery of native heart without angina pectoris (HCC) Clinically stable.  Continue aspirin and atorvastatin.  Not on beta-blocker as pulses been in the 50s to 60s.   3. Essential hypertension Not at goal.  He also has significant lower extremity edema.  I will have him discontinue lisinopril/HCTZ.  Change to lisinopril and furosemide.  I will have him follow-up with me in 1 month - lisinopril (ZESTRIL) 20 MG tablet; Take 1 tablet (20 mg total) by mouth daily.  Dispense: 90 tablet; Refill: 3 - furosemide (LASIX) 20 MG tablet; Take 1 tablet (20 mg total) by mouth daily. Stop Lisinopril/HCTZ  Dispense: 30 tablet; Refill: 3  4. Edema of both legs See #3 above. Strongly encouraged him to cut back on the salt.  He reports that he has problems seeing how  much salt is actually being shaken onto his food.  I recommend shaking the salt in the palm of his hand to gauge how much he is putting in his food.  Alternatively he can use salt substitutes for garlic salt  5. Diabetic retinopathy of both eyes associated with type 2 diabetes mellitus, macular edema presence unspecified, unspecified retinopathy severity ([place]) Keep follow-up appointment with Dr. [name]  6. Trigger middle finger of right hand - Ambulatory referral to Orthopedic Surgery    Patient was given the opportunity to ask questions.  Patient verbalized understanding of the plan and was able to repeat key elements of the plan.   Orders Placed This Encounter  Procedures  . Amb ref to Medical Nutrition Therapy-MNT  . Ambulatory referral to Orthopedic Surgery  . POCT glucose (manual entry)  . POCT glycosylated hemoglobin (Hb A1C)     Requested Prescriptions   Signed Prescriptions Disp Refills  . Insulin Glargine (LANTUS SOLOSTAR) 100 UNIT/ML Solostar Pen 15 mL 1    Sig: INJECT 34 UNITS INTO THE  SKIN DAILY.  Marland Kitchen insulin lispro (HUMALOG KWIKPEN) 100 UNIT/ML KwikPen 15 mL 11    Sig: 10 units TID with meals  . lisinopril (ZESTRIL) 20 MG tablet 90 tablet 3    Sig: Take 1 tablet (20 mg total) by mouth daily.  . furosemide (LASIX) 20 MG tablet 30 tablet 3    Sig: Take 1 tablet (20 mg total) by mouth daily. Stop Lisinopril/HCTZ  . Continuous Blood Gluc Receiver (FREESTYLE LIBRE READER) DEVI 1 each 0    Sig: 1 Device by Does not apply route once for 1 dose.  . Continuous Blood Gluc Sensor (FREESTYLE LIBRE SENSOR SYSTEM) MISC 2 each 12    Sig: Change sensor Q 2 wks    Return in about 4 weeks (around 11/14/2019).  Karle Plumber, MD, FACP

## 2019-10-17 NOTE — Patient Instructions (Signed)
Your diabetes is not at goal. I have referred you to see a nutritionist for some counseling. Increase Lantus insulin to 34 units daily. Increase Humalog to 10 units with meals. Please check your blood sugars at least twice a day before meals and bring in those readings on your next visit.  Your blood pressure is not at goal.  Please work on limiting salt in the foods as discussed today. Stop lisinopril/hydrochlorothiazide. I have sent a prescription to your pharmacy for lisinopril 20 mg daily and furosemide 20 mg daily.

## 2019-10-18 DIAGNOSIS — H4050X3 Glaucoma secondary to other eye disorders, unspecified eye, severe stage: Secondary | ICD-10-CM | POA: Diagnosis not present

## 2019-10-18 DIAGNOSIS — H211X1 Other vascular disorders of iris and ciliary body, right eye: Secondary | ICD-10-CM | POA: Diagnosis not present

## 2019-10-18 DIAGNOSIS — E113599 Type 2 diabetes mellitus with proliferative diabetic retinopathy without macular edema, unspecified eye: Secondary | ICD-10-CM | POA: Diagnosis not present

## 2019-10-18 DIAGNOSIS — E113511 Type 2 diabetes mellitus with proliferative diabetic retinopathy with macular edema, right eye: Secondary | ICD-10-CM | POA: Diagnosis not present

## 2019-10-21 ENCOUNTER — Other Ambulatory Visit: Payer: Self-pay

## 2019-10-21 ENCOUNTER — Ambulatory Visit (INDEPENDENT_AMBULATORY_CARE_PROVIDER_SITE_OTHER): Payer: Medicare Other | Admitting: Orthopaedic Surgery

## 2019-10-21 ENCOUNTER — Encounter: Payer: Self-pay | Admitting: Physician Assistant

## 2019-10-21 DIAGNOSIS — M65331 Trigger finger, right middle finger: Secondary | ICD-10-CM

## 2019-10-21 MED ORDER — BUPIVACAINE HCL 0.25 % IJ SOLN
0.3300 mL | INTRAMUSCULAR | Status: AC | PRN
  Administered 2019-10-21: .33 mL

## 2019-10-21 MED ORDER — LIDOCAINE HCL 1 % IJ SOLN
1.0000 mL | INTRAMUSCULAR | Status: AC | PRN
  Administered 2019-10-21: 1 mL

## 2019-10-21 MED ORDER — METHYLPREDNISOLONE ACETATE 40 MG/ML IJ SUSP
13.3300 mg | INTRAMUSCULAR | Status: AC | PRN
  Administered 2019-10-21: 13.33 mg

## 2019-10-21 NOTE — Progress Notes (Signed)
Office Visit Note   Patient: Derek Lowery           Date of Birth: February 08, 1956           MRN: [id number] Visit Date: 10/21/2019              Requested by: Ladell Pier, MD [address] [place],  [place] [address] PCP: Ladell Pier, MD   Assessment & Plan: Visit Diagnoses:  1. Trigger middle finger of right hand     Plan: Impression is right long trigger finger.  We injected this with cortisone today.  He will follow-up with [place] as needed.  Follow-Up Instructions: Return if symptoms worsen or fail to improve.   Orders:  Orders Placed This Encounter  Procedures  . Hand/UE Inj: R long A1   No orders of the defined types were placed in this encounter.     Procedures: Hand/UE Inj: R long A1 for trigger finger on 10/21/2019 10:08 AM Indications: pain Details: 25 G needle Medications: 1 mL lidocaine 1 %; 0.33 mL bupivacaine 0.25 %; 13.33 mg methylPREDNISolone acetate 40 MG/ML      Clinical Data: No additional findings.   Subjective: No chief complaint on file.   HPI patient is a pleasant 64 year old right-hand-dominant gentleman who presents to our clinic today with pain and triggering to the right long finger.  He noticed this approximately 4 to 6 weeks ago.  This is worsened over time.  No previous history of trigger finger.  He does have have type 2 diabetes.  Review of Systems as detailed in HPI.  All others reviewed and are negative.   Objective: Vital Signs: There were no vitals taken for this visit.  Physical Exam well-developed and well-nourished gentleman in no acute distress.  Alert and oriented x3.  Ortho Exam examination of his right long finger reveals noticeable triggering.  He does have a markedly tender and palpable nodule at the A1 pulley.  He is neurovascular intact distally.  Specialty Comments:  No specialty comments available.  Imaging: No new imaging   PMFS History: Patient Active Problem List   Diagnosis Date Noted    . Trigger middle finger of right hand 10/17/2019  . Pain due to onychomycosis of toenails of both feet 08/07/2019  . Educated about COVID-19 virus infection 12/02/2018  . RBBB 12/02/2018  . Benign paroxysmal positional vertigo 06/25/2018  . Pre-ulcerative corn or callous 10/13/2017  . Anemia 10/13/2017  . Chronic diarrhea 06/09/2017  . Microalbuminuria 03/07/2017  . Dry skin 03/05/2017  . Glaucoma 03/05/2017  . Legally blind 03/05/2017  . Retinopathy due to secondary diabetes ([place]) 02/06/2016  . Diabetic polyneuropathy associated with type 2 diabetes mellitus ([place]) 01/12/2015  . Fournier's gangrene into true pelvis s/p I&D 05/05/2014 05/05/2014  . Type 2 diabetes mellitus with complication, with long-term current use of insulin ([place]) 07/17/2011  . HLD (hyperlipidemia) 07/17/2011  . Coronary artery disease of native artery of native heart with stable angina pectoris ([place]) 07/17/2011  . Essential hypertension 07/17/2011   Past Medical History:  Diagnosis Date  . Arthritis   . CAD (coronary artery disease)    NSTEMI 06/2011:  LHC 07/18/11: Proximal diagonal 60%, distal LAD with a diabetic appearance and 60% stenosis, OM2 with an occluded superior branch and an inferior branch with 90%, EF 55% with inferior hypokinesis.  PCI: Promus DES to the OM2 inferior branch.  This vessel provides collaterals to the superior branch which remained occluded.  Echocardiogram 07/18/11: EF  60%, normal wall motion.  . Cataract   . Essential hypertension, benign   . Glaucoma   . Hypercholesteremia   . Internal hemorrhoids   . Noncompliance   . Type 2 diabetes mellitus ([place]) 1990    Family History  Problem Relation Age of Onset  . Coronary artery disease Father        Developed in his 52s  . Kidney disease Father   . Diabetes Father   . Melanoma Father   . Rectal cancer Father   . Heart failure Mother   . Hypertension Mother   . Diabetes Sister   . Diabetes Brother   . Colon cancer Neg Hx   .  Esophageal cancer Neg Hx   . Stomach cancer Neg Hx     Past Surgical History:  Procedure Laterality Date  . CHOLECYSTECTOMY N/A 09/21/2017   Procedure: LAPAROSCOPIC CHOLECYSTECTOMY WITH INTRAOPERATIVE CHOLANGIOGRAM;  Surgeon: Michael Boston, MD;  Location: [facility];  Service: General;  Laterality: N/A;  . COLONOSCOPY  2000   Dr. Collene Mares  . EYE SURGERY    . IRRIGATION AND DEBRIDEMENT ABSCESS Left 05/05/2014   Procedure: IRRIGATION AND DEBRIDEMENT ABSCESS left buttock;  Surgeon: Michael Boston, MD;  Location: [facility];  Service: General;  Laterality: Left;  . LEFT HEART CATHETERIZATION WITH CORONARY ANGIOGRAM N/A 07/18/2011   Procedure: LEFT HEART CATHETERIZATION WITH CORONARY ANGIOGRAM;  Surgeon: Hillary Bow, MD;  Location: [facility] CATH LAB;  Service: Cardiovascular;  Laterality: N/A;  . PERCUTANEOUS CORONARY STENT INTERVENTION (PCI-S)  07/18/2011   Procedure: PERCUTANEOUS CORONARY STENT INTERVENTION (PCI-S);  Surgeon: Hillary Bow, MD;  Location: [facility] CATH LAB;  Service: Cardiovascular;;   Social History   Occupational History  . Occupation: Theatre stage manager: [employer]  Tobacco Use  . Smoking status: Never Smoker  . Smokeless tobacco: Never Used  Substance and Sexual Activity  . Alcohol use: No  . Drug use: No  . Sexual activity: Never

## 2019-10-28 DIAGNOSIS — H4052X3 Glaucoma secondary to other eye disorders, left eye, severe stage: Secondary | ICD-10-CM | POA: Diagnosis not present

## 2019-10-28 DIAGNOSIS — H4089 Other specified glaucoma: Secondary | ICD-10-CM | POA: Diagnosis not present

## 2019-10-31 ENCOUNTER — Ambulatory Visit (INDEPENDENT_AMBULATORY_CARE_PROVIDER_SITE_OTHER): Payer: Medicare Other | Admitting: Podiatry

## 2019-10-31 ENCOUNTER — Other Ambulatory Visit: Payer: Self-pay

## 2019-10-31 VITALS — Temp 96.7°F

## 2019-10-31 DIAGNOSIS — B351 Tinea unguium: Secondary | ICD-10-CM | POA: Diagnosis not present

## 2019-10-31 DIAGNOSIS — E1142 Type 2 diabetes mellitus with diabetic polyneuropathy: Secondary | ICD-10-CM | POA: Diagnosis not present

## 2019-10-31 DIAGNOSIS — M79675 Pain in left toe(s): Secondary | ICD-10-CM | POA: Diagnosis not present

## 2019-10-31 DIAGNOSIS — M2041 Other hammer toe(s) (acquired), right foot: Secondary | ICD-10-CM | POA: Diagnosis not present

## 2019-10-31 DIAGNOSIS — M2042 Other hammer toe(s) (acquired), left foot: Secondary | ICD-10-CM

## 2019-10-31 DIAGNOSIS — L853 Xerosis cutis: Secondary | ICD-10-CM

## 2019-10-31 DIAGNOSIS — L84 Corns and callosities: Secondary | ICD-10-CM

## 2019-10-31 DIAGNOSIS — E119 Type 2 diabetes mellitus without complications: Secondary | ICD-10-CM

## 2019-10-31 DIAGNOSIS — M79674 Pain in right toe(s): Secondary | ICD-10-CM

## 2019-10-31 MED ORDER — AMMONIUM LACTATE 12 % EX LOTN: TOPICAL_LOTION | CUTANEOUS | 6 refills | Status: DC

## 2019-10-31 NOTE — Patient Instructions (Addendum)
[name],  We will have someone from our Orthotics and Prosthetics Dept call you to schedule you an appointment for measuring/choosing your diabetic shoes.  We discussed you calling and scheduling an appointment with Dr. Oval Linsey (Cardiology) for swelling in your legs/feet.   Please moisturize your feet twice daily. Do not apply lotion between the toes.    Diabetes Mellitus and Foot Care Foot care is an important part of your health, especially when you have diabetes. Diabetes may cause you to have problems because of poor blood flow (circulation) to your feet and legs, which can cause your skin to:  Become thinner and drier.  Break more easily.  Heal more slowly.  Peel and crack. You may also have nerve damage (neuropathy) in your legs and feet, causing decreased feeling in them. This means that you may not notice minor injuries to your feet that could lead to more serious problems. Noticing and addressing any potential problems early is the best way to prevent future foot problems. How to care for your feet Foot hygiene  Wash your feet daily with warm water and mild soap. Do not use hot water. Then, pat your feet and the areas between your toes until they are completely dry. Do not soak your feet as this can dry your skin.  Trim your toenails straight across. Do not dig under them or around the cuticle. File the edges of your nails with an emery board or nail file.  Apply a moisturizing lotion or petroleum jelly to the skin on your feet and to dry, brittle toenails. Use lotion that does not contain alcohol and is unscented. Do not apply lotion between your toes. Shoes and socks  Wear clean socks or stockings every day. Make sure they are not too tight. Do not wear knee-high stockings since they may decrease blood flow to your legs.  Wear shoes that fit properly and have enough cushioning. Always look in your shoes before you put them on to be sure there are no objects  inside.  To break in new shoes, wear them for just a few hours a day. This prevents injuries on your feet. Wounds, scrapes, corns, and calluses  Check your feet daily for blisters, cuts, bruises, sores, and redness. If you cannot see the bottom of your feet, use a mirror or ask someone for help.  Do not cut corns or calluses or try to remove them with medicine.  If you find a minor scrape, cut, or break in the skin on your feet, keep it and the skin around it clean and dry. You may clean these areas with mild soap and water. Do not clean the area with peroxide, alcohol, or iodine.  If you have a wound, scrape, corn, or callus on your foot, look at it several times a day to make sure it is healing and not infected. Check for: ? Redness, swelling, or pain. ? Fluid or blood. ? Warmth. ? Pus or a bad smell. General instructions  Do not cross your legs. This may decrease blood flow to your feet.  Do not use heating pads or hot water bottles on your feet. They may burn your skin. If you have lost feeling in your feet or legs, you may not know this is happening until it is too late.  Protect your feet from hot and cold by wearing shoes, such as at the beach or on hot pavement.  Schedule a complete foot exam at least once a year (annually) or  more often if you have foot problems. If you have foot problems, report any cuts, sores, or bruises to your health care provider immediately. Contact a health care provider if:  You have a medical condition that increases your risk of infection and you have any cuts, sores, or bruises on your feet.  You have an injury that is not healing.  You have redness on your legs or feet.  You feel burning or tingling in your legs or feet.  You have pain or cramps in your legs and feet.  Your legs or feet are numb.  Your feet always feel cold.  You have pain around a toenail. Get help right away if:  You have a wound, scrape, corn, or callus on your  foot and: ? You have pain, swelling, or redness that gets worse. ? You have fluid or blood coming from the wound, scrape, corn, or callus. ? Your wound, scrape, corn, or callus feels warm to the touch. ? You have pus or a bad smell coming from the wound, scrape, corn, or callus. ? You have a fever. ? You have a red line going up your leg. Summary  Check your feet every day for cuts, sores, red spots, swelling, and blisters.  Moisturize feet and legs daily.  Wear shoes that fit properly and have enough cushioning.  If you have foot problems, report any cuts, sores, or bruises to your health care provider immediately.  Schedule a complete foot exam at least once a year (annually) or more often if you have foot problems. This information is not intended to replace advice given to you by your health care provider. Make sure you discuss any questions you have with your health care provider. Document Revised: 04/27/2019 Document Reviewed: 09/05/2016 Elsevier Patient Education  [place].

## 2019-11-02 NOTE — Progress Notes (Signed)
Virtual Visit via Telephone Note   This visit type was conducted due to national recommendations for restrictions regarding the COVID-19 Pandemic (e.g. social distancing) in an effort to limit this patient's exposure and mitigate transmission in our community.  Due to his co-morbid illnesses, this patient is at least at moderate risk for complications without adequate follow up.  This format is felt to be most appropriate for this patient at this time.  The patient did not have access to video technology/had technical difficulties with video requiring transitioning to audio format only (telephone).  All issues noted in this document were discussed and addressed.  No physical exam could be performed with this format.  Please refer to the patient's chart for his  consent to telehealth for [facility].  Evaluation Performed:  Follow-up visit  This visit type was conducted due to national recommendations for restrictions regarding the COVID-19 Pandemic (e.g. social distancing).  This format is felt to be most appropriate for this patient at this time.  All issues noted in this document were discussed and addressed.  No physical exam was performed (except for noted visual exam findings with Video Visits).  Please refer to the patient's chart (MyChart message for video visits and phone note for telephone visits) for the patient's consent to telehealth for [facility]  Date:  11/03/2019   ID:  Derek Lowery, DOB 1956-02-04, MRN [id number]  Patient Location:  [place] [address]   Provider location:      [place] [address] Office [phone] Fax [phone]   PCP:  Ladell Pier, MD  Cardiologist:  Skeet Latch, MD  Electrophysiologist:  None   Chief Complaint: Follow-up  History of Present Illness:    Derek Lowery is a 64 y.o. male who presents via audio/video conferencing for a telehealth  visit today.  Patient verified DOB and address.  The patient does not symptoms concerning for COVID-19 infection (fever, chills, cough, or new SHORTNESS OF BREATH).   Mr. Severt has a past medical history of coronary artery disease, blindness, essential hypertension, RBBB, type 2 diabetes mellitus, benign paroxysmal postural vertigo, and hyperlipidemia.  He was seen by Dr. Percival Spanish 12/02/2018 for an abnormal EKG.  He is followed by Dr. Oval Linsey.  His ejection fraction 2/19 was 60-65%.  He underwent cardiac catheterization 2012 which showed 60% diagonal stenosis, distal LAD 60% stenosis, OM 2 superior branch and inferior 90% stenosed.  He received DES stenting to the OM 2 inferior branch.  His ejection fraction was preserved and he was placed on medical management.  He was lost to follow-up until 2019 when he needed cardiac evaluation prior to gallbladder surgery.  He was seen and evaluated in the hospital.  At that time he had a right arm branch block.  His echocardiogram was normal and he had no significant cardiac abnormalities at that time.  When he was seen in follow-up 11/2018 he was doing well.  He denied chest pressure, neck and arm discomfort and palpitations.  He denied presyncope or syncope, shortness of breath, PND, and orthopnea.  He had no lower extremity edema and had not gained any weight at that time.  No changes were made in his therapy.  He is seen virtually today for follow-up and states he has been evaluated by podiatrist for left foot swelling.  Podiatry was not concerned about infection/DVT and provided him with some lotion to apply to his lower  extremity.  He has been started on furosemide 20 mg by his PCP.  He went on to state that he received lower extremity support stockings yesterday however, he is not put them on yet.  He tries to eat a low-sodium diet.  He confesses that he is fairly sedentary at home.  His blood pressure is elevated this morning but he is not taking his blood  pressure medication or diuretics yet today.  I will give him the salty 6 information sheet, have him wear his lower extremity support stockings, give a blood pressure log and have him follow-up in 1 month for further evaluation.  Today he denies chest pain, shortness of breath, fatigue, palpitations, melena, hematuria, hemoptysis, diaphoresis, weakness, presyncope, syncope, orthopnea, and PND.   Prior CV studies:   The following studies were reviewed today:  EKG 09/19/2017 Sinus rhythm prolonged PR interval right bundle branch block 85 bpm  Echocardiogram 09/21/2017 Study Conclusions   - Left ventricle: The cavity size was normal. Systolic function was  normal. The estimated ejection fraction was in the range of 60%  to 65%. Wall motion was normal; there were no regional wall  motion abnormalities. Doppler parameters are consistent with both  elevated ventricular end-diastolic filling pressure and elevated  left atrial filling pressure.  - Left atrium: The atrium was mildly dilated.  - Atrial septum: No defect or patent foramen ovale was identified.  Past Medical History:  Diagnosis Date  . Arthritis   . CAD (coronary artery disease)    NSTEMI 06/2011:  LHC 07/18/11: Proximal diagonal 60%, distal LAD with a diabetic appearance and 60% stenosis, OM2 with an occluded superior branch and an inferior branch with 90%, EF 55% with inferior hypokinesis.  PCI: Promus DES to the OM2 inferior branch.  This vessel provides collaterals to the superior branch which remained occluded.  Echocardiogram 07/18/11: EF 60%, normal wall motion.  . Cataract   . Essential hypertension, benign   . Glaucoma   . Hypercholesteremia   . Internal hemorrhoids   . Noncompliance   . Type 2 diabetes mellitus ([place]) 1990   Past Surgical History:  Procedure Laterality Date  . CHOLECYSTECTOMY N/A 09/21/2017   Procedure: LAPAROSCOPIC CHOLECYSTECTOMY WITH INTRAOPERATIVE CHOLANGIOGRAM;  Surgeon: Michael Boston,  MD;  Location: [facility];  Service: General;  Laterality: N/A;  . COLONOSCOPY  2000   Dr. Collene Mares  . EYE SURGERY    . IRRIGATION AND DEBRIDEMENT ABSCESS Left 05/05/2014   Procedure: IRRIGATION AND DEBRIDEMENT ABSCESS left buttock;  Surgeon: Michael Boston, MD;  Location: [facility];  Service: General;  Laterality: Left;  . LEFT HEART CATHETERIZATION WITH CORONARY ANGIOGRAM N/A 07/18/2011   Procedure: LEFT HEART CATHETERIZATION WITH CORONARY ANGIOGRAM;  Surgeon: Hillary Bow, MD;  Location: [facility] CATH LAB;  Service: Cardiovascular;  Laterality: N/A;  . PERCUTANEOUS CORONARY STENT INTERVENTION (PCI-S)  07/18/2011   Procedure: PERCUTANEOUS CORONARY STENT INTERVENTION (PCI-S);  Surgeon: Hillary Bow, MD;  Location: [facility] CATH LAB;  Service: Cardiovascular;;     Current Meds  Medication Sig  . ammonium lactate (AMLACTIN) 12 % lotion Apply to both feet twice daily for dry skin.  Marland Kitchen aspirin EC 81 MG tablet Take 1 tablet (81 mg total) by mouth daily.  Marland Kitchen atorvastatin (LIPITOR) 40 MG tablet Take 1 tablet daily  . blood glucose meter kit and supplies KIT Dispense based on patient and insurance preference. Use up to four times daily as directed. One Touch Verio  . brimonidine (ALPHAGAN) 0.2 % ophthalmic solution INSTILL  1 DROP INTO RIGHT EYE TWICE A DAY  . Continuous Blood Gluc Sensor (FREESTYLE LIBRE SENSOR SYSTEM) MISC Change sensor Q 2 wks  . dorzolamide (TRUSOPT) 2 % ophthalmic solution Place 1 drop into the right eye 2 (two) times daily.  . furosemide (LASIX) 20 MG tablet Take 1 tablet (20 mg total) by mouth daily. Stop Lisinopril/HCTZ  . glucose blood (ACCU-CHEK AVIVA PLUS) test strip Use as instructed for 3 times daily testing of blood sugar. E11.9  . glucose monitoring kit (FREESTYLE) monitoring kit 1 each by Does not apply route as needed for other.  . Insulin Glargine (LANTUS SOLOSTAR) 100 UNIT/ML Solostar Pen INJECT 34 UNITS INTO THE SKIN DAILY.  Marland Kitchen insulin lispro (HUMALOG KWIKPEN) 100 UNIT/ML KwikPen 10  units TID with meals  . Insulin Pen Needle (BD PEN NEEDLE NANO U/F) 32G X 4 MM MISC USE AS DIRECTED 3 TIMES A DAY. Please schedule an appointment for additional refills.  . Lancets MISC Use as directed.  Accu chek 2  . latanoprost (XALATAN) 0.005 % ophthalmic solution Place 1 drop into the right eye at bedtime.  Marland Kitchen lisinopril (ZESTRIL) 20 MG tablet Take 1 tablet (20 mg total) by mouth daily.  . Multiple Vitamins-Minerals (MULTIVITAMIN WITH MINERALS) tablet Take 1 tablet by mouth daily.  . timolol (BETIMOL) 0.5 % ophthalmic solution Place 1 drop into the right eye 2 (two) times daily.     Allergies:   Patient has no known allergies.   Social History   Tobacco Use  . Smoking status: Never Smoker  . Smokeless tobacco: Never Used  Substance Use Topics  . Alcohol use: No  . Drug use: No     Family Hx: The patient's family history includes Coronary artery disease in his father; Diabetes in his brother, father, and sister; Heart failure in his mother; Hypertension in his mother; Kidney disease in his father; Melanoma in his father; Rectal cancer in his father. There is no history of Colon cancer, Esophageal cancer, or Stomach cancer.  ROS:   Please see the history of present illness.     All other systems reviewed and are negative.   Labs/Other Tests and Data Reviewed:    Recent Labs: 07/12/2019: ALT 14; BUN 25; Creatinine, Ser 1.14; Hemoglobin 12.6; Platelets 138; Potassium 5.1; Sodium 141   Recent Lipid Panel Lab Results  Component Value Date/Time   CHOL 122 07/12/2019 10:42 AM   TRIG 180 (H) 07/12/2019 10:42 AM   HDL 30 (L) 07/12/2019 10:42 AM   CHOLHDL 4.1 07/12/2019 10:42 AM   CHOLHDL 3.2 11/29/2015 09:36 AM   LDLCALC 62 07/12/2019 10:42 AM    Wt Readings from Last 3 Encounters:  10/17/19 244 lb (110.7 kg)  07/12/19 243 lb (110.2 kg)  03/17/19 239 lb (108.4 kg)     Exam:    Vital Signs:  BP (!) 175/82   Pulse 85   Ht _0  (1.778 m)   BMI 35.01 kg/m    Well  nourished, well developed male in no  acute distress.   ASSESSMENT & PLAN:    1.  Bilateral lower extremity edema-left lower extremity more swollen than right lower extremity. Continue furosemide 20 mg daily Lower extremity support stockings Increase physical activity as tolerated Elevate extremities when not active Heart healthy low-sodium diet-salty 6 given  Coronary artery disease-no chest pain today. Continue aspirin 81 mg tablet daily Continue atorvastatin 40 mg daily Continue lisinopril 20 mg daily Continue furosemide 20 mg daily Heart healthy low-sodium diet Increase  physical activity as tolerated  Essential hypertension-BP today 175/82.  Has not taken his medications yet this morning. Continue lisinopril 20 mg tablet daily Continue furosemide 20 mg tablet daily Heart healthy low-sodium diet-salty 6 given Increase physical activity as tolerated Blood pressure log  Hyperlipidemia-LDL 62 07/12/2019 Continue atorvastatin 40 mg daily Heart healthy low sodium high-fiber diet Increase physical activity as tolerated  Diabetes mellitus-A1c 11.8 10/17/2019 Continue insulin Continue heart healthy low-sodium carb modified diet Followed by PCP  Follow-up with me in 1 month.  COVID-19 Education: The signs and symptoms of COVID-19 were discussed with the patient and how to seek care for testing (follow up with PCP or arrange E-visit).  The importance of social distancing was discussed today.  Patient Risk:   After full review of this patients clinical status, I feel that they are at least moderate risk at this time.  Time:   Today, I have spent 15 minutes with the patient with telehealth technology discussing lower extremity edema, diet, exercise, medications.     Medication Adjustments/Labs and Tests Ordered: Current medicines are reviewed at length with the patient today.  Concerns regarding medicines are outlined above.   Tests Ordered: No orders of the defined types  were placed in this encounter.  Medication Changes: No orders of the defined types were placed in this encounter.   Disposition:  in 1 month(s)  Signed, Jossie Ng. [facility] [place] [address] Office [phone] Fax [phone]

## 2019-11-03 ENCOUNTER — Telehealth (INDEPENDENT_AMBULATORY_CARE_PROVIDER_SITE_OTHER): Payer: Medicare Other | Admitting: General Practice

## 2019-11-03 VITALS — BP 175/82 | HR 85 | Ht 70.0 in

## 2019-11-03 DIAGNOSIS — I10 Essential (primary) hypertension: Secondary | ICD-10-CM | POA: Diagnosis not present

## 2019-11-03 DIAGNOSIS — E0800 Diabetes mellitus due to underlying condition with hyperosmolarity without nonketotic hyperglycemic-hyperosmolar coma (NKHHC): Secondary | ICD-10-CM | POA: Diagnosis not present

## 2019-11-03 DIAGNOSIS — I2510 Atherosclerotic heart disease of native coronary artery without angina pectoris: Secondary | ICD-10-CM

## 2019-11-03 DIAGNOSIS — R600 Localized edema: Secondary | ICD-10-CM | POA: Diagnosis not present

## 2019-11-03 DIAGNOSIS — E7800 Pure hypercholesterolemia, unspecified: Secondary | ICD-10-CM

## 2019-11-03 NOTE — Patient Instructions (Addendum)
Medication Instructions:  The current medical regimen is effective;  continue present plan and medications as directed. Please refer to the Current Medication list given to you today. If you need a refill on your cardiac medications before your next appointment, please call your pharmacy.  Special Instructions: TAKE AND LOG YOUR BLOOD PRESSURE DAILY; BRING WITH YOU TO YOUR FOLLOW UP APPOINTMENT  PLEASE PURCHASE AND WEAR COMPRESSION STOCKINGS DAILY AND OFF AT BEDTIME. Compression stockings are elastic socks that squeeze the legs. They help to increase blood flow to the legs and to decrease swelling in the legs from fluid retention, and reduce the chance of developing blood clots in the lower legs. Please elevate your legs when sitting.  PLEASE READ AND FOLLOW SALTY 6 ATTACHED  Follow-Up: 1 MONTH 12/12/2019 @ 11:45 AM, PLEASE ARRIVE @1130AM  WEARING A MASK. In [place], [place].    At [facility], you and your health needs are our priority.  As part of our continuing mission to provide you with exceptional heart care, we have created designated Provider Care Teams.  These Care Teams include your primary Cardiologist (physician) and Advanced Practice Providers (APPs -  Physician Assistants and Nurse Practitioners) who all work together to provide you with the care you need, when you need it.  Reduce your risk of getting COVID-19 With your heart disease it is especially important for people at increased risk of severe illness from COVID-19, and those who live with them, to protect themselves from getting COVID-19. The best way to protect yourself and to help reduce the spread of the virus that causes COVID-19 is to: Marland Kitchen Limit your interactions with other people as much as possible. . Take COVID-19 when you do interact with others. If you start feeling sick and think you may have COVID-19, get in touch with your healthcare provider within 24 hours.  Thank you for choosing [facility] at  [facility]!!

## 2019-11-04 ENCOUNTER — Ambulatory Visit: Payer: Medicare Other | Admitting: Orthotics

## 2019-11-04 ENCOUNTER — Other Ambulatory Visit: Payer: Self-pay

## 2019-11-04 DIAGNOSIS — E1142 Type 2 diabetes mellitus with diabetic polyneuropathy: Secondary | ICD-10-CM

## 2019-11-04 DIAGNOSIS — L97511 Non-pressure chronic ulcer of other part of right foot limited to breakdown of skin: Secondary | ICD-10-CM

## 2019-11-04 DIAGNOSIS — L84 Corns and callosities: Secondary | ICD-10-CM

## 2019-11-04 NOTE — Progress Notes (Signed)

## 2019-11-06 ENCOUNTER — Encounter: Payer: Self-pay | Admitting: Podiatry

## 2019-11-06 NOTE — Progress Notes (Signed)
Subjective: Derek Lowery presents today for follow up of preventative diabetic foot care and at risk foot care. Patient has history of NIDDM and neuropathy.   No Known Allergies   Objective: Vitals:   10/31/19 1121  Temp: (!) 96.7 F (35.9 C)    Pt 64 y.o. year old male  in NAD. AAO x 3.   Vascular Examination:  Capillary fill time to digits <3 seconds b/l. Palpable DP pulses b/l. Palpable PT pulses b/l. Pedal hair absent b/l Skin temperature gradient within normal limits b/l. Evidence of chronic venous insufficiency b/l LE.  Dermatological Examination: Pedal skin with normal turgor, texture and tone bilaterally. No open wounds bilaterally. No interdigital macerations bilaterally. Toenails 1-5 b/l elongated, dystrophic, thickened, crumbly with subungual debris and tenderness to dorsal palpation. Hyperkeratotic lesion(s) submet head 5 right foot.  No erythema, no edema, no drainage, no flocculence. Pedal skin noted to be dry and flaky b/l.  Musculoskeletal: Normal muscle strength 5/5 to all lower extremity muscle groups bilaterally, no pain crepitus or joint limitation noted with ROM b/l and hammertoes noted to the  2-5 bilaterally  Neurological: Protective sensation diminished with 10g monofilament b/l. Vibratory sensation absent b/l  Assessment: 1. Pain due to onychomycosis of toenails of both feet   2. Xerosis cutis   3. Callus   4. Acquired hammertoes of both feet   5. Diabetic peripheral neuropathy associated with type 2 diabetes mellitus ([place])   6. Encounter for diabetic foot exam ([place])    Plan: -Diabetic foot examination performed on today's visit. -Discussed diabetic foot care principles. Literature dispensed on today. -Toenails 1-5 b/l were debrided in length and girth with sterile nail nippers and dremel without iatrogenic bleeding.  -Callus(es) submet head 5 right foot were debrided without complication or incident. Total number debrided =1. -Patient to continue  soft, supportive shoe gear daily. Start procedure for diabetic shoes. Patient qualifies based on diagnoses. -Patient to report any pedal injuries to medical professional immediately. -Rx sent to pharmacy for AmLactin Lotion 12% to be applied to feet twice daily for xerosis -Patient/POA to call should there be question/concern in the interim.  Return in about 9 weeks (around 01/02/2020) for diabetic nail and callus trim.

## 2019-11-12 ENCOUNTER — Other Ambulatory Visit: Payer: Self-pay | Admitting: Internal Medicine

## 2019-11-12 DIAGNOSIS — E785 Hyperlipidemia, unspecified: Secondary | ICD-10-CM

## 2019-11-25 ENCOUNTER — Ambulatory Visit: Payer: Medicare Other | Admitting: Internal Medicine

## 2019-11-29 ENCOUNTER — Other Ambulatory Visit: Payer: Self-pay

## 2019-11-29 ENCOUNTER — Encounter (INDEPENDENT_AMBULATORY_CARE_PROVIDER_SITE_OTHER): Payer: Medicare Other | Admitting: Ophthalmology

## 2019-11-30 ENCOUNTER — Ambulatory Visit (INDEPENDENT_AMBULATORY_CARE_PROVIDER_SITE_OTHER): Payer: Medicare Other | Admitting: Ophthalmology

## 2019-11-30 ENCOUNTER — Encounter (INDEPENDENT_AMBULATORY_CARE_PROVIDER_SITE_OTHER): Payer: Self-pay | Admitting: Ophthalmology

## 2019-11-30 DIAGNOSIS — E113599 Type 2 diabetes mellitus with proliferative diabetic retinopathy without macular edema, unspecified eye: Secondary | ICD-10-CM | POA: Diagnosis not present

## 2019-11-30 DIAGNOSIS — E113592 Type 2 diabetes mellitus with proliferative diabetic retinopathy without macular edema, left eye: Secondary | ICD-10-CM

## 2019-11-30 DIAGNOSIS — H211X1 Other vascular disorders of iris and ciliary body, right eye: Secondary | ICD-10-CM

## 2019-11-30 DIAGNOSIS — H4050X3 Glaucoma secondary to other eye disorders, unspecified eye, severe stage: Secondary | ICD-10-CM

## 2019-11-30 DIAGNOSIS — E113551 Type 2 diabetes mellitus with stable proliferative diabetic retinopathy, right eye: Secondary | ICD-10-CM

## 2019-11-30 DIAGNOSIS — H5410 Blindness, one eye, low vision other eye, unspecified eyes: Secondary | ICD-10-CM | POA: Diagnosis not present

## 2019-11-30 DIAGNOSIS — E113511 Type 2 diabetes mellitus with proliferative diabetic retinopathy with macular edema, right eye: Secondary | ICD-10-CM | POA: Insufficient documentation

## 2019-11-30 DIAGNOSIS — H2140 Pupillary membranes, unspecified eye: Secondary | ICD-10-CM | POA: Insufficient documentation

## 2019-11-30 MED ORDER — BEVACIZUMAB CHEMO INJECTION 1.25MG/0.05ML SYRINGE FOR KALEIDOSCOPE
1.2500 mg | INTRAVITREAL | Status: AC | PRN
  Administered 2019-11-30: 13:00:00 1.25 mg via INTRAVITREAL

## 2019-11-30 NOTE — Progress Notes (Signed)
11/30/2019     CHIEF COMPLAINT Patient presents for Diabetic Eye Exam   HISTORY OF PRESENT ILLNESS: Derek Lowery is a 64 y.o. male who presents to the clinic today for:   HPI    Diabetic Eye Exam    Vision is stable.  Associated Symptoms Flashes and Floaters.  Negative for Pain.  Diabetes characteristics include Type 2.  Blood sugar level is controlled.  Last A1C 11.  I, the attending physician,  performed the HPI with the patient and updated documentation appropriately.          Comments    6 Week Diabetic Exam OD. Possible Avastin OD. OCT  Pt states vision is stable, has occasional blurry vision. Pt states he sees his normal FOl and floaters but has not noticed any new ones.       Last edited by Tilda Franco on 11/30/2019 11:05 AM. (History)      Referring physician: Ladell Pier, MD [place],  [place] [id number]  HISTORICAL INFORMATION:   Selected notes from the MEDICAL RECORD NUMBER    Lab Results  Component Value Date   HGBA1C 11.8 (A) 10/17/2019     CURRENT MEDICATIONS: Current Outpatient Medications (Ophthalmic Drugs)  Medication Sig  . brimonidine (ALPHAGAN) 0.2 % ophthalmic solution INSTILL 1 DROP INTO RIGHT EYE TWICE A DAY  . dorzolamide (TRUSOPT) 2 % ophthalmic solution Place 1 drop into the right eye 2 (two) times daily.  Marland Kitchen latanoprost (XALATAN) 0.005 % ophthalmic solution Place 1 drop into the right eye at bedtime.  . timolol (BETIMOL) 0.5 % ophthalmic solution Place 1 drop into the right eye 2 (two) times daily.  Marland Kitchen VYZULTA 0.024 % SOLN Place 1 drop into the right eye at bedtime.   No current facility-administered medications for this visit. (Ophthalmic Drugs)   Current Outpatient Medications (Other)  Medication Sig  . ammonium lactate (AMLACTIN) 12 % lotion Apply to both feet twice daily for dry skin.  Marland Kitchen aspirin EC 81 MG tablet Take 1 tablet (81 mg total) by mouth daily.  Marland Kitchen atorvastatin (LIPITOR) 40 MG tablet TAKE 1  TABLET BY MOUTH EVERY DAY  . blood glucose meter kit and supplies KIT Dispense based on patient and insurance preference. Use up to four times daily as directed. One Touch Verio  . Continuous Blood Gluc Sensor (FREESTYLE LIBRE SENSOR SYSTEM) MISC Change sensor Q 2 wks  . furosemide (LASIX) 20 MG tablet Take 1 tablet (20 mg total) by mouth daily. Stop Lisinopril/HCTZ  . glucose blood (ACCU-CHEK AVIVA PLUS) test strip Use as instructed for 3 times daily testing of blood sugar. E11.9  . glucose monitoring kit (FREESTYLE) monitoring kit 1 each by Does not apply route as needed for other.  . Insulin Glargine (LANTUS SOLOSTAR) 100 UNIT/ML Solostar Pen INJECT 34 UNITS INTO THE SKIN DAILY.  Marland Kitchen insulin lispro (HUMALOG KWIKPEN) 100 UNIT/ML KwikPen 10 units TID with meals  . Insulin Pen Needle (BD PEN NEEDLE NANO U/F) 32G X 4 MM MISC USE AS DIRECTED 3 TIMES A DAY. Please schedule an appointment for additional refills.  . Lancets MISC Use as directed.  Accu chek 2  . lisinopril (ZESTRIL) 20 MG tablet Take 1 tablet (20 mg total) by mouth daily.  . Multiple Vitamins-Minerals (MULTIVITAMIN WITH MINERALS) tablet Take 1 tablet by mouth daily.   No current facility-administered medications for this visit. (Other)      REVIEW OF SYSTEMS: ROS    Positive for: Endocrine  Last edited by Tilda Franco on 11/30/2019 11:05 AM. (History)       ALLERGIES No Known Allergies  PAST MEDICAL HISTORY Past Medical History:  Diagnosis Date  . Arthritis   . CAD (coronary artery disease)    NSTEMI 06/2011:  [facility] 07/18/11: Proximal diagonal 60%, distal LAD with a diabetic appearance and 60% stenosis, OM2 with an occluded superior branch and an inferior branch with 90%, EF 55% with inferior hypokinesis.  PCI: Promus DES to the OM2 inferior branch.  This vessel provides collaterals to the superior branch which remained occluded.  Echocardiogram 07/18/11: EF 60%, normal wall motion.  . Cataract   . Essential  hypertension, benign   . Glaucoma   . Hypercholesteremia   . Internal hemorrhoids   . Noncompliance   . Type 2 diabetes mellitus ([place]) 1990   Past Surgical History:  Procedure Laterality Date  . CATARACT EXTRACTION Right 2019   Dr. Katy Fitch  . CHOLECYSTECTOMY N/A 09/21/2017   Procedure: LAPAROSCOPIC CHOLECYSTECTOMY WITH INTRAOPERATIVE CHOLANGIOGRAM;  Surgeon: Michael Boston, MD;  Location: [facility];  Service: General;  Laterality: N/A;  . COLONOSCOPY  2000   Dr. Collene Mares  . EYE SURGERY    . IRRIGATION AND DEBRIDEMENT ABSCESS Left 05/05/2014   Procedure: IRRIGATION AND DEBRIDEMENT ABSCESS left buttock;  Surgeon: Michael Boston, MD;  Location: [facility];  Service: General;  Laterality: Left;  . LEFT HEART CATHETERIZATION WITH CORONARY ANGIOGRAM N/A 07/18/2011   Procedure: LEFT HEART CATHETERIZATION WITH CORONARY ANGIOGRAM;  Surgeon: Hillary Bow, MD;  Location: [facility] CATH LAB;  Service: Cardiovascular;  Laterality: N/A;  . PERCUTANEOUS CORONARY STENT INTERVENTION (PCI-S)  07/18/2011   Procedure: PERCUTANEOUS CORONARY STENT INTERVENTION (PCI-S);  Surgeon: Hillary Bow, MD;  Location: [facility] CATH LAB;  Service: Cardiovascular;;    FAMILY HISTORY Family History  Problem Relation Age of Onset  . Coronary artery disease Father        Developed in his 52s  . Kidney disease Father   . Diabetes Father   . Melanoma Father   . Rectal cancer Father   . Heart failure Mother   . Hypertension Mother   . Diabetes Sister   . Diabetes Brother   . Colon cancer Neg Hx   . Esophageal cancer Neg Hx   . Stomach cancer Neg Hx     SOCIAL HISTORY Social History   Tobacco Use  . Smoking status: Never Smoker  . Smokeless tobacco: Never Used  Substance Use Topics  . Alcohol use: No  . Drug use: No         OPHTHALMIC EXAM:  Base Eye Exam    Visual Acuity (Snellen - Linear)      Right Left   Dist [place] 20/30 -1 NLP       Tonometry (Tonopen, 11:09 AM)      Right Left   Pressure 14 30       Pupils       Pupils Dark Light Shape React APD   Right PERRL 2 2 Irregular Minimal None   Left PERRL 2 2 Round Minimal None       Visual Fields (Counting fingers)      Left Right   Restrictions Total superior temporal, inferior temporal, superior nasal, inferior nasal deficiencies Total superior temporal, inferior temporal, superior nasal, inferior nasal deficiencies       Neuro/Psych    Oriented x3: Yes   Mood/Affect: Normal       Dilation    Right eye: 1.0%  Mydriacyl, 2.5% Phenylephrine @ 11:10 AM        Slit Lamp and Fundus Exam    External Exam      Right Left   External Normal Normal       Slit Lamp Exam      Right Left   Lids/Lashes Normal Normal   Conjunctiva/Sclera White and quiet White and quiet   Cornea Clear Clear   Anterior Chamber Deep and quiet Deep   [place]  Old neovascularization of the iris 360 degrees, stable nonprogressive   Lens Posterior chamber intraocular lens 3+ Nuclear sclerosis       Fundus Exam      Right Left   Posterior Vitreous Posterior vitreous detachment No view   Disc Normal Normal   C/D Ratio 0.5    Macula Microaneurysms, no macular thickening, no clinically significant macular edema    Vessels Left of diabetic retinopathy, quiet    Periphery Normal, good panretinal photocoagulation           IMAGING AND PROCEDURES  Imaging and Procedures for _0 @  OCT, Retina - OU - Both Eyes       Right Eye Quality was good. Scan locations included subfoveal. Progression has been stable.   Notes No active clinically significant macular edema.       Intravitreal Injection, Pharmacologic Agent - OD - Right Eye       Time Out 11/30/2019. 12:44 PM. Confirmed correct patient, procedure, site, and patient consented.   Anesthesia Topical anesthesia was used. Anesthetic medications included [place] 3.5%.   Procedure Preparation included Ofloxacin , 10% betadine to eyelids, 5% betadine to ocular surface. A 30 gauge needle was used.    Injection:  1.25 mg Bevacizumab (AVASTIN) SOLN   NDC: [address]-5, Lot: [id number]   Route: Intravitreal, Site: Right Eye, Waste: 0 mg  Post-op Post injection exam found visual acuity of at least counting fingers. The patient tolerated the procedure well. There were no complications. The patient received written and verbal post procedure care education. Post injection medications were not given.                 ASSESSMENT/PLAN:  No problem-specific Assessment & Plan notes found for this encounter.      ICD-10-CM   1. Stable proliferative diabetic retinopathy of right eye associated with type 2 diabetes mellitus (HCC)  T34.2876 Intravitreal Injection, Pharmacologic Agent - OD - Right Eye    Bevacizumab (AVASTIN) SOLN 1.25 mg  2. Proliferative diabetic retinopathy of left eye without macular edema associated with type 2 diabetes mellitus (HCC)  O11.5726 OCT, Retina - OU - Both Eyes  3. Rubeosis iridis of right eye  H21.1X1   4. Diabetic visual loss better eye: severe vision impairment; lesser eye: blind, without macular edema, with proliferative retinopathy, associated with type 2 diabetes mellitus ([place])  E11.3599 OCT, Retina - OU - Both Eyes   H54.10   5. Glaucoma with pupillary seclusion, unspecified laterality, severe stage  H40.50X3    H21.40     1.  Intravitreal Avastin today OD in an attempt to control the recurrent rubeosis in the past.  2.  OD may need additional PRP as the vitreous clears to prevent in VI progression  3.  Ophthalmic Meds Ordered this visit:  Meds ordered this encounter  Medications  . Bevacizumab (AVASTIN) SOLN 1.25 mg       Return in about 6 weeks (around 01/11/2020) for AVASTIN OCT, OD.  Patient Instructions  The nature of proliferative  diabetic retinopathy was discussed with the patient as well as the potential for a vitreous hemorrhage and traction on the retina. Tight control of glucose, blood pressure, and serum lipids was recommended in  order to slow further progression of disease. Cessation of smoking was recommended. Treatment options including ocular injectable medications, panretinal photocoagulation and/or vitrectomy surgery for advanced disease were discussed. Local medical therapy may slow or arrest progression of disease.    Explained the diagnoses, plan, and follow up with the patient and they expressed understanding.  Patient expressed understanding of the importance of proper follow up care.   Clent Demark Mariusz Jubb M.D. Diseases & Surgery of the Retina and Vitreous Retina & Diabetic [place] _0 @     Abbreviations: M myopia (nearsighted); A astigmatism; H hyperopia (farsighted); P presbyopia; Mrx spectacle prescription;  CTL contact lenses; OD right eye; OS left eye; OU both eyes  XT exotropia; ET esotropia; PEK punctate epithelial keratitis; PEE punctate epithelial erosions; DES dry eye syndrome; MGD meibomian gland dysfunction; ATs artificial tears; PFAT's preservative free artificial tears; [place] nuclear sclerotic cataract; PSC posterior subcapsular cataract; ERM epi-retinal membrane; PVD posterior vitreous detachment; RD retinal detachment; DM diabetes mellitus; DR diabetic retinopathy; NPDR non-proliferative diabetic retinopathy; PDR proliferative diabetic retinopathy; CSME clinically significant macular edema; DME diabetic macular edema; dbh dot blot hemorrhages; CWS cotton wool spot; POAG primary open angle glaucoma; C/D cup-to-disc ratio; HVF humphrey visual field; GVF goldmann visual field; OCT optical coherence tomography; IOP intraocular pressure; BRVO Branch retinal vein occlusion; CRVO central retinal vein occlusion; CRAO central retinal artery occlusion; BRAO branch retinal artery occlusion; RT retinal tear; SB scleral buckle; PPV pars plana vitrectomy; VH Vitreous hemorrhage; PRP panretinal laser photocoagulation; IVK intravitreal kenalog; VMT vitreomacular traction; MH Macular hole;  NVD neovascularization of  the disc; NVE neovascularization elsewhere; AREDS age related eye disease study; ARMD age related macular degeneration; POAG primary open angle glaucoma; EBMD epithelial/anterior basement membrane dystrophy; ACIOL anterior chamber intraocular lens; IOL intraocular lens; PCIOL posterior chamber intraocular lens; Phaco/IOL phacoemulsification with intraocular lens placement; [place] photorefractive keratectomy; LASIK laser assisted in situ keratomileusis; HTN hypertension; DM diabetes mellitus; COPD chronic obstructive pulmonary disease

## 2019-11-30 NOTE — Patient Instructions (Signed)
The nature of proliferative diabetic retinopathy was discussed with the patient as well as the potential for a vitreous hemorrhage and traction on the retina. Tight control of glucose, blood pressure, and serum lipids was recommended in order to slow further progression of disease. Cessation of smoking was recommended. Treatment options including ocular injectable medications, panretinal photocoagulation and/or vitrectomy surgery for advanced disease were discussed. Local medical therapy may slow or arrest progression of disease.

## 2019-12-09 DIAGNOSIS — H4052X3 Glaucoma secondary to other eye disorders, left eye, severe stage: Secondary | ICD-10-CM | POA: Diagnosis not present

## 2019-12-09 DIAGNOSIS — H4089 Other specified glaucoma: Secondary | ICD-10-CM | POA: Diagnosis not present

## 2019-12-11 NOTE — Progress Notes (Incomplete)
? ?  Cardiology Clinic Note  ? ?Patient Name: Derek Lowery ?Date of Encounter: 12/11/2019 ? ?Primary Care Provider:  Ladell Pier, MD ?Primary Cardiologist:  Skeet Latch, MD ? ?Patient Profile  ?  ?[name] 64 year old male presents today for follow-up evaluation of his lower extremity edema. ? ?Past Medical History  ?  ?Past Medical History:  ?Diagnosis Date  ?? Arthritis   ?? CAD (coronary artery disease)   ? NSTEMI 06/2011:  [place] 07/18/11: Proximal diagonal 60%, distal LAD with a diabetic appearance and 60% stenosis, OM2 with an occluded superior branch and an inferior branch with 90%, EF 55% with inferior hypokinesis.  PCI: Promus DES to the OM2 inferior branch.  This vessel provides collaterals to the superior branch which remained occluded.  Echocardiogram 07/18/11: EF 60%, normal wall motion.  ?? Cataract   ?? Essential hypertension, benign   ?? Glaucoma   ?? Hypercholesteremia   ?? Internal hemorrhoids   ?? Noncompliance   ?? Type 2 diabetes mellitus ([place]) 1990  ? ?Past Surgical History:  ?Procedure Laterality Date  ?? CATARACT EXTRACTION Right 2019  ? Dr. Katy Fitch  ?? CHOLECYSTECTOMY N/A 09/21/2017  ? Procedure: LAPAROSCOPIC CHOLECYSTECTOMY WITH INTRAOPERATIVE CHOLANGIOGRAM;  Surgeon: Michael Boston, MD;  Location: [facility];  Service: General;  Laterality: N/A;  ?? COLONOSCOPY  2000  ? Dr. Collene Mares  ?? EYE SURGERY    ?? IRRIGATION AND DEBRIDEMENT ABSCESS Left 05/05/2014  ? Procedure: IRRIGATION AND DEBRIDEMENT ABSCESS left buttock;  Surgeon: Michael Boston, MD;  Location: [facility];  Service: General;  Laterality: Left;  ?? LEFT HEART CATHETERIZATION WITH CORONARY ANGIOGRAM N/A 07/18/2011  ? Procedure: LEFT HEART CATHETERIZATION WITH CORONARY ANGIOGRAM;  Surgeon: Hillary Bow, MD;  Location: [facility] CATH LAB;  Service: Cardiovascular;  Laterality: N/A;  ?? PERCUTANEOUS CORONARY STENT INTERVENTION (PCI-S)  07/18/2011  ?  Procedure: PERCUTANEOUS CORONARY STENT INTERVENTION (PCI-S);  Surgeon: Hillary Bow, MD;  Location: [facility] CATH LAB;  Service: Cardiovasc

## 2019-12-12 ENCOUNTER — Ambulatory Visit: Payer: Medicare Other | Admitting: General Practice

## 2019-12-20 ENCOUNTER — Other Ambulatory Visit: Payer: Self-pay | Admitting: Internal Medicine

## 2019-12-20 DIAGNOSIS — I10 Essential (primary) hypertension: Secondary | ICD-10-CM

## 2020-01-10 ENCOUNTER — Other Ambulatory Visit: Payer: Self-pay | Admitting: Internal Medicine

## 2020-01-10 DIAGNOSIS — I10 Essential (primary) hypertension: Secondary | ICD-10-CM

## 2020-01-11 ENCOUNTER — Other Ambulatory Visit: Payer: Self-pay

## 2020-01-11 ENCOUNTER — Ambulatory Visit: Payer: Medicare Other | Admitting: Podiatry

## 2020-01-11 ENCOUNTER — Encounter (INDEPENDENT_AMBULATORY_CARE_PROVIDER_SITE_OTHER): Payer: Self-pay | Admitting: Ophthalmology

## 2020-01-11 ENCOUNTER — Ambulatory Visit (INDEPENDENT_AMBULATORY_CARE_PROVIDER_SITE_OTHER): Payer: Medicare Other | Admitting: Ophthalmology

## 2020-01-11 DIAGNOSIS — E113511 Type 2 diabetes mellitus with proliferative diabetic retinopathy with macular edema, right eye: Secondary | ICD-10-CM

## 2020-01-11 DIAGNOSIS — E113592 Type 2 diabetes mellitus with proliferative diabetic retinopathy without macular edema, left eye: Secondary | ICD-10-CM

## 2020-01-11 DIAGNOSIS — E113551 Type 2 diabetes mellitus with stable proliferative diabetic retinopathy, right eye: Secondary | ICD-10-CM

## 2020-01-11 MED ORDER — BEVACIZUMAB CHEMO INJECTION 1.25MG/0.05ML SYRINGE FOR KALEIDOSCOPE
1.2500 mg | INTRAVITREAL | Status: AC | PRN
  Administered 2020-01-11: 1.25 mg via INTRAVITREAL

## 2020-01-11 NOTE — Progress Notes (Signed)
01/11/2020     CHIEF COMPLAINT Patient presents for Retina Follow Up   HISTORY OF PRESENT ILLNESS: Derek Lowery is a 64 y.o. male who presents to the clinic today for:   HPI    Retina Follow Up    Patient presents with  Diabetic Retinopathy.  In right eye.  Severity is moderate.  Duration of 6 weeks.  Since onset it is stable.  I, the attending physician,  performed the HPI with the patient and updated documentation appropriately.          Comments    6 Week PDR f\u OD. Possible Avastin OD. OCT  Pt states vision is stable. Pt states he saw Dr. Katy Fitch last month and states OD iris has moved.  OD Pupil is now located higher BGL: did not check        Last edited by Hurman Horn, MD on 01/11/2020 11:41 AM. (History)      Referring physician: Ladell Pier, MD [place],  [place] [address]  HISTORICAL INFORMATION:   Selected notes from the MEDICAL RECORD NUMBER    Lab Results  Component Value Date   HGBA1C 11.8 (A) 10/17/2019     CURRENT MEDICATIONS: Current Outpatient Medications (Ophthalmic Drugs)  Medication Sig  . brimonidine (ALPHAGAN) 0.2 % ophthalmic solution INSTILL 1 DROP INTO RIGHT EYE TWICE A DAY  . dorzolamide (TRUSOPT) 2 % ophthalmic solution Place 1 drop into the right eye 2 (two) times daily.  Marland Kitchen latanoprost (XALATAN) 0.005 % ophthalmic solution Place 1 drop into the right eye at bedtime.  . timolol (BETIMOL) 0.5 % ophthalmic solution Place 1 drop into the right eye 2 (two) times daily.  Marland Kitchen VYZULTA 0.024 % SOLN Place 1 drop into the right eye at bedtime.   No current facility-administered medications for this visit. (Ophthalmic Drugs)   Current Outpatient Medications (Other)  Medication Sig  . ammonium lactate (AMLACTIN) 12 % lotion Apply to both feet twice daily for dry skin.  Marland Kitchen aspirin EC 81 MG tablet Take 1 tablet (81 mg total) by mouth daily.  Marland Kitchen atorvastatin (LIPITOR) 40 MG tablet TAKE 1 TABLET BY MOUTH EVERY DAY  . blood  glucose meter kit and supplies KIT Dispense based on patient and insurance preference. Use up to four times daily as directed. One Touch Verio  . Continuous Blood Gluc Sensor (FREESTYLE LIBRE SENSOR SYSTEM) MISC Change sensor Q 2 wks  . furosemide (LASIX) 20 MG tablet TAKE 1 TABLET (20 MG TOTAL) BY MOUTH DAILY. STOP LISINOPRIL/HCTZ  . glucose blood (ACCU-CHEK AVIVA PLUS) test strip Use as instructed for 3 times daily testing of blood sugar. E11.9  . glucose monitoring kit (FREESTYLE) monitoring kit 1 each by Does not apply route as needed for other.  . Insulin Glargine (LANTUS SOLOSTAR) 100 UNIT/ML Solostar Pen INJECT 34 UNITS INTO THE SKIN DAILY.  Marland Kitchen insulin lispro (HUMALOG KWIKPEN) 100 UNIT/ML KwikPen 10 units TID with meals  . Insulin Pen Needle (BD PEN NEEDLE NANO U/F) 32G X 4 MM MISC USE AS DIRECTED 3 TIMES A DAY. Please schedule an appointment for additional refills.  . Lancets MISC Use as directed.  Accu chek 2  . lisinopril (ZESTRIL) 20 MG tablet Take 1 tablet (20 mg total) by mouth daily.  . Multiple Vitamins-Minerals (MULTIVITAMIN WITH MINERALS) tablet Take 1 tablet by mouth daily.   No current facility-administered medications for this visit. (Other)      REVIEW OF SYSTEMS: ROS  Positive for: Endocrine   Last edited by Tilda Franco on 01/11/2020 10:35 AM. (History)       ALLERGIES No Known Allergies  PAST MEDICAL HISTORY Past Medical History:  Diagnosis Date  . Arthritis   . CAD (coronary artery disease)    NSTEMI 06/2011:  LHC 07/18/11: Proximal diagonal 60%, distal LAD with a diabetic appearance and 60% stenosis, OM2 with an occluded superior branch and an inferior branch with 90%, EF 55% with inferior hypokinesis.  PCI: Promus DES to the OM2 inferior branch.  This vessel provides collaterals to the superior branch which remained occluded.  Echocardiogram 07/18/11: EF 60%, normal wall motion.  . Cataract   . Essential hypertension, benign   . Glaucoma   .  Hypercholesteremia   . Internal hemorrhoids   . Noncompliance   . Type 2 diabetes mellitus ([place]) 1990   Past Surgical History:  Procedure Laterality Date  . CATARACT EXTRACTION Right 2019   Dr. Katy Fitch  . CHOLECYSTECTOMY N/A 09/21/2017   Procedure: LAPAROSCOPIC CHOLECYSTECTOMY WITH INTRAOPERATIVE CHOLANGIOGRAM;  Surgeon: Michael Boston, MD;  Location: [facility];  Service: General;  Laterality: N/A;  . COLONOSCOPY  2000   Dr. Collene Mares  . EYE SURGERY    . IRRIGATION AND DEBRIDEMENT ABSCESS Left 05/05/2014   Procedure: IRRIGATION AND DEBRIDEMENT ABSCESS left buttock;  Surgeon: Michael Boston, MD;  Location: [facility];  Service: General;  Laterality: Left;  . LEFT HEART CATHETERIZATION WITH CORONARY ANGIOGRAM N/A 07/18/2011   Procedure: LEFT HEART CATHETERIZATION WITH CORONARY ANGIOGRAM;  Surgeon: Hillary Bow, MD;  Location[facility] CATH LAB;  Service: Cardiovascular;  Laterality: N/A;  . PERCUTANEOUS CORONARY STENT INTERVENTION (PCI-S)  07/18/2011   Procedure: PERCUTANEOUS CORONARY STENT INTERVENTION (PCI-S);  Surgeon: Hillary Bow, MD;  Location: [facility] CATH LAB;  Service: Cardiovascular;;    FAMILY HISTORY Family History  Problem Relation Age of Onset  . Coronary artery disease Father        Developed in his 68s  . Kidney disease Father   . Diabetes Father   . Melanoma Father   . Rectal cancer Father   . Heart failure Mother   . Hypertension Mother   . Diabetes Sister   . Diabetes Brother   . Colon cancer Neg Hx   . Esophageal cancer Neg Hx   . Stomach cancer Neg Hx     SOCIAL HISTORY Social History   Tobacco Use  . Smoking status: Never Smoker  . Smokeless tobacco: Never Used  Substance Use Topics  . Alcohol use: No  . Drug use: No         OPHTHALMIC EXAM:  Base Eye Exam    Visual Acuity (Snellen - Linear)      Right Left   Dist [place] 20/30 NLP       Tonometry (Tonopen, 10:38 AM)      Right Left   Pressure 14 32       Pupils      Dark Light Shape React APD   Right 2 2  Irregular Minimal None   Left 2 2 Round Minimal None       Visual Fields      Left Right   Restrictions Total superior temporal, inferior temporal, superior nasal, inferior nasal deficiencies Total superior temporal, inferior temporal, superior nasal, inferior nasal deficiencies       Neuro/Psych    Oriented x3: Yes   Mood/Affect: Normal       Dilation    Right eye: 1.0% Mydriacyl,  2.5% Phenylephrine @ 10:38 AM        Slit Lamp and Fundus Exam    External Exam      Right Left   External Normal Normal       Slit Lamp Exam      Right Left   Lids/Lashes Normal Normal   Conjunctiva/Sclera White and quiet White and quiet   Cornea Clear Clear   Anterior Chamber Deep and quiet Deep   Iris Old NVI large trunks, no signs of new  NVI Old neovascularization of the iris 360 degrees, stable nonprogressive   Lens Posterior chamber intraocular lens 3+ Nuclear sclerosis       Fundus Exam      Right Left   Posterior Vitreous Posterior vitreous detachment No view   Disc Normal Normal   C/D Ratio 0.5    Macula Microaneurysms, no macular thickening, no clinically significant macular edema    Vessels Left of diabetic retinopathy, quiet    Periphery Normal, good panretinal photocoagulation, room posteriorly from arcade to the mid periphery for additional PRP, and hopefully this will decrease vegF load           IMAGING AND PROCEDURES  Imaging and Procedures for 01/11/20  OCT, Retina - OU - Both Eyes       Right Eye Quality was good. Scan locations included subfoveal. Central Foveal Thickness: 294. Progression has improved.   Notes Much less CSME OD, status post Avastin, we will repeat today       Intravitreal Injection, Pharmacologic Agent - OD - Right Eye       Time Out 01/11/2020. 11:43 AM. Confirmed correct patient, procedure, site, and patient consented.   Anesthesia Topical anesthesia was used. Anesthetic medications included Akten 3.5%.   Procedure Preparation  included Tobramycin 0.3%, 10% betadine to eyelids. A 30 gauge needle was used.   Injection:  1.25 mg Bevacizumab (AVASTIN) SOLN   NDC: [address]-5, Lot: [id number]   Route: Intravitreal, Site: Right Eye, Waste: 0 mg  Post-op Post injection exam found visual acuity of at least counting fingers. The patient tolerated the procedure well. There were no complications. The patient received written and verbal post procedure care education. Post injection medications were not given.                 ASSESSMENT/PLAN:  Diabetic macular edema of right eye with proliferative retinopathy associated with type 2 diabetes mellitus (HCC) Pete intravitreal Avastin OD today to control total eye neovascular disease, will deliver PRP in the near future      ICD-10-CM   1. Stable proliferative diabetic retinopathy of right eye associated with type 2 diabetes mellitus (HCC)  E11.3551 OCT, Retina - OU - Both Eyes    Intravitreal Injection, Pharmacologic Agent - OD - Right Eye    Bevacizumab (AVASTIN) SOLN 1.25 mg  2. Proliferative diabetic retinopathy of left eye without macular edema associated with type 2 diabetes mellitus (HCC)  P38.2505 OCT, Retina - OU - Both Eyes  3. Diabetic macular edema of right eye with proliferative retinopathy associated with type 2 diabetes mellitus ([place])  E11.3511     1.  Repeat intravitreal Avastin OD today to control anterior segment vascularization as well as posterior segment disease  2.  There is room posteriorly for PRP this will be delivered in 3 weeks  3.  Ophthalmic Meds Ordered this visit:  Meds ordered this encounter  Medications  . Bevacizumab (AVASTIN) SOLN 1.25 mg       Return  in about 3 weeks (around 02/01/2020) for PRP, dilate, OD.  There are no Patient Instructions on file for this visit.   Explained the diagnoses, plan, and follow up with the patient and they expressed understanding.  Patient expressed understanding of the importance of proper  follow up care.   Clent Demark Rankin M.D. Diseases & Surgery of the Retina and Vitreous Retina & Diabetic [place] 01/11/20     Abbreviations: M myopia (nearsighted); A astigmatism; H hyperopia (farsighted); P presbyopia; Mrx spectacle prescription;  CTL contact lenses; OD right eye; OS left eye; OU both eyes  XT exotropia; ET esotropia; PEK punctate epithelial keratitis; PEE punctate epithelial erosions; DES dry eye syndrome; MGD meibomian gland dysfunction; ATs artificial tears; PFAT's preservative free artificial tears; [place] nuclear sclerotic cataract; PSC posterior subcapsular cataract; ERM epi-retinal membrane; PVD posterior vitreous detachment; RD retinal detachment; DM diabetes mellitus; DR diabetic retinopathy; NPDR non-proliferative diabetic retinopathy; PDR proliferative diabetic retinopathy; CSME clinically significant macular edema; DME diabetic macular edema; dbh dot blot hemorrhages; CWS cotton wool spot; POAG primary open angle glaucoma; C/D cup-to-disc ratio; HVF humphrey visual field; GVF goldmann visual field; OCT optical coherence tomography; IOP intraocular pressure; BRVO Branch retinal vein occlusion; CRVO central retinal vein occlusion; CRAO central retinal artery occlusion; BRAO branch retinal artery occlusion; RT retinal tear; SB scleral buckle; PPV pars plana vitrectomy; VH Vitreous hemorrhage; PRP panretinal laser photocoagulation; IVK intravitreal kenalog; VMT vitreomacular traction; MH Macular hole;  NVD neovascularization of the disc; NVE neovascularization elsewhere; AREDS age related eye disease study; ARMD age related macular degeneration; POAG primary open angle glaucoma; EBMD epithelial/anterior basement membrane dystrophy; ACIOL anterior chamber intraocular lens; IOL intraocular lens; PCIOL posterior chamber intraocular lens; Phaco/IOL phacoemulsification with intraocular lens placement; [place] photorefractive keratectomy; LASIK laser assisted in situ keratomileusis; HTN  hypertension; DM diabetes mellitus; COPD chronic obstructive pulmonary disease

## 2020-01-11 NOTE — Assessment & Plan Note (Signed)
Pete intravitreal Avastin OD today to control total eye neovascular disease, will deliver PRP in the near future

## 2020-01-12 ENCOUNTER — Other Ambulatory Visit: Payer: Self-pay | Admitting: Internal Medicine

## 2020-01-12 DIAGNOSIS — I10 Essential (primary) hypertension: Secondary | ICD-10-CM

## 2020-01-13 ENCOUNTER — Encounter: Payer: Medicare Other | Admitting: Pharmacist

## 2020-01-13 ENCOUNTER — Ambulatory Visit: Payer: Medicare Other | Attending: Internal Medicine | Admitting: Pharmacist

## 2020-01-13 ENCOUNTER — Other Ambulatory Visit: Payer: Self-pay

## 2020-01-13 VITALS — BP 138/79 | HR 70 | Temp 97.5°F | Ht 70.0 in | Wt 246.4 lb

## 2020-01-13 DIAGNOSIS — Z0000 Encounter for general adult medical examination without abnormal findings: Secondary | ICD-10-CM

## 2020-01-13 LAB — GLUCOSE, POCT (MANUAL RESULT ENTRY): POC Glucose: 272 mg/dl — AB (ref 70–99)

## 2020-01-13 NOTE — Progress Notes (Signed)
Subjective:   Derek Lowery is a 64 y.o. male who presents for Medicare Annual/Subsequent preventive examination.     Objective:    Vitals: BP 138/79   Pulse 70   Temp (!) 97.5 F (36.4 C) Comment: Taken with temporal monitor  Ht _0  (1.778 m)   Wt 246 lb 6.4 oz (111.8 kg)   BMI 35.35 kg/m   Body mass index is 35.35 kg/m.  Advanced Directives 01/13/2020 03/17/2019 04/11/2018 09/19/2017 06/09/2017 03/05/2017 01/08/2017  Does Patient Have a Medical Advance Directive? _1  No No  Would patient like information on creating a medical advance directive? No - Patient declined Yes (MAU/Ambulatory/Procedural Areas - Information given) - No - Patient declined No - Patient declined No - Patient declined -  Pre-existing out of facility DNR order (yellow form or pink MOST form) - - - - - - -    Tobacco Social History   Tobacco Use  Smoking Status Never Smoker  Smokeless Tobacco Never Used     Counseling given: Not Answered   Clinical Intake:  Pre-visit preparation completed: No  Pain : No/denies pain     BMI - recorded: 35.35 Nutritional Status: BMI > 30  Obese Diabetes: Yes CBG done?: Yes CBG resulted in Enter/ Edit results?: Yes Did pt. bring in CBG monitor from home?: No  How often do you need to have someone help you when you read instructions, pamphlets, or other written materials from your doctor or pharmacy?: 1 - Never  Interpreter Needed?: No     Past Medical History:  Diagnosis Date  . Arthritis   . CAD (coronary artery disease)    NSTEMI 06/2011:  LHC 07/18/11: Proximal diagonal 60%, distal LAD with a diabetic appearance and 60% stenosis, OM2 with an occluded superior branch and an inferior branch with 90%, EF 55% with inferior hypokinesis.  PCI: Promus DES to the OM2 inferior branch.  This vessel provides collaterals to the superior branch which remained occluded.  Echocardiogram 07/18/11: EF 60%, normal wall motion.  . Cataract   . Essential  hypertension, benign   . Glaucoma   . Hypercholesteremia   . Internal hemorrhoids   . Noncompliance   . Type 2 diabetes mellitus ([place]) 1990   Past Surgical History:  Procedure Laterality Date  . CATARACT EXTRACTION Right 2019   Dr. Katy Fitch  . CHOLECYSTECTOMY N/A 09/21/2017   Procedure: LAPAROSCOPIC CHOLECYSTECTOMY WITH INTRAOPERATIVE CHOLANGIOGRAM;  Surgeon: Michael Boston, MD;  Location: [facility];  Service: General;  Laterality: N/A;  . COLONOSCOPY  2000   Dr. Collene Mares  . EYE SURGERY    . IRRIGATION AND DEBRIDEMENT ABSCESS Left 05/05/2014   Procedure: IRRIGATION AND DEBRIDEMENT ABSCESS left buttock;  Surgeon: Michael Boston, MD;  Location: [facility];  Service: General;  Laterality: Left;  . LEFT HEART CATHETERIZATION WITH CORONARY ANGIOGRAM N/A 07/18/2011   Procedure: LEFT HEART CATHETERIZATION WITH CORONARY ANGIOGRAM;  Surgeon: Hillary Bow, MD;  Location: [facility] CATH LAB;  Service: Cardiovascular;  Laterality: N/A;  . PERCUTANEOUS CORONARY STENT INTERVENTION (PCI-S)  07/18/2011   Procedure: PERCUTANEOUS CORONARY STENT INTERVENTION (PCI-S);  Surgeon: Hillary Bow, MD;  Location: [facility] CATH LAB;  Service: Cardiovascular;;   Family History  Problem Relation Age of Onset  . Coronary artery disease Father        Developed in his 65s  . Kidney disease Father   . Diabetes Father   . Melanoma Father   . Rectal cancer Father   .  Heart failure Mother   . Hypertension Mother   . Diabetes Sister   . Diabetes Brother   . Colon cancer Neg Hx   . Esophageal cancer Neg Hx   . Stomach cancer Neg Hx    Social History   Socioeconomic History  . Marital status: Single    Spouse name: Not on file  . Number of children: Not on file  . Years of education: Not on file  . Highest education level: Not on file  Occupational History  . Occupation: Theatre stage manager: [employer]  Tobacco Use  . Smoking status: Never Smoker  . Smokeless tobacco: Never Used  Substance and Sexual Activity  . Alcohol use: No    . Drug use: No  . Sexual activity: Never  Other Topics Concern  . Not on file  Social History Narrative   Works at [employer]   NOK=[phone] [place] Strain:   . Difficulty of Paying Living Expenses:   Food Insecurity: No Food Insecurity  . Worried About Charity fundraiser in the Last Year: Never true  . Ran Out of Food in the Last Year: Never true  Transportation Needs: No Transportation Needs  . Lack of Transportation (Medical): No  . Lack of Transportation (Non-Medical): No  Physical Activity: Inactive  . Days of Exercise per Week: 0 days  . Minutes of Exercise per Session: 0 min  Stress: No Stress Concern Present  . Feeling of Stress : Not at all  Social Connections: Moderately Isolated  . Frequency of Communication with Friends and Family: Three times a week  . Frequency of Social Gatherings with Friends and Family: Three times a week  . Attends Religious Services: Never  . Active Member of Clubs or Organizations: No  . Attends Archivist Meetings: Never  . Marital Status: Never married    Outpatient Encounter Medications as of 01/13/2020  Medication Sig  . ammonium lactate (AMLACTIN) 12 % lotion Apply to both feet twice daily for dry skin.  Marland Kitchen aspirin EC 81 MG tablet Take 1 tablet (81 mg total) by mouth daily.  Marland Kitchen atorvastatin (LIPITOR) 40 MG tablet TAKE 1 TABLET BY MOUTH EVERY DAY  . blood glucose meter kit and supplies KIT Dispense based on patient and insurance preference. Use up to four times daily as directed. One Touch Verio  . brimonidine (ALPHAGAN) 0.2 % ophthalmic solution INSTILL 1 DROP INTO RIGHT EYE TWICE A DAY  . Continuous Blood Gluc Sensor (FREESTYLE LIBRE SENSOR SYSTEM) MISC Change sensor Q 2 wks  . dorzolamide (TRUSOPT) 2 % ophthalmic solution Place 1 drop into the right eye 2 (two) times daily.  . furosemide (LASIX) 20 MG tablet TAKE 1 TABLET (20 MG TOTAL) BY MOUTH DAILY. STOP  LISINOPRIL/HCTZ  . glucose blood (ACCU-CHEK AVIVA PLUS) test strip Use as instructed for 3 times daily testing of blood sugar. E11.9  . glucose monitoring kit (FREESTYLE) monitoring kit 1 each by Does not apply route as needed for other.  . hydrochlorothiazide (HYDRODIURIL) 12.5 MG tablet TAKE 1 TABLET BY MOUTH EVERY DAY  . Insulin Glargine (LANTUS SOLOSTAR) 100 UNIT/ML Solostar Pen INJECT 34 UNITS INTO THE SKIN DAILY.  Marland Kitchen insulin lispro (HUMALOG KWIKPEN) 100 UNIT/ML KwikPen 10 units TID with meals  . Insulin Pen Needle (BD PEN NEEDLE NANO U/F) 32G X 4 MM MISC USE AS DIRECTED 3 TIMES A DAY. Please schedule an appointment for additional refills.  Marland Kitchen  Lancets MISC Use as directed.  Accu chek 2  . latanoprost (XALATAN) 0.005 % ophthalmic solution Place 1 drop into the right eye at bedtime.  Marland Kitchen lisinopril (ZESTRIL) 20 MG tablet Take 1 tablet (20 mg total) by mouth daily.  . Multiple Vitamins-Minerals (MULTIVITAMIN WITH MINERALS) tablet Take 1 tablet by mouth daily.  . timolol (BETIMOL) 0.5 % ophthalmic solution Place 1 drop into the right eye 2 (two) times daily.  Marland Kitchen VYZULTA 0.024 % SOLN Place 1 drop into the right eye at bedtime.   No facility-administered encounter medications on file as of 01/13/2020.    Activities of Daily Living In your present state of health, do you have any difficulty performing the following activities: 01/13/2020 03/17/2019  Hearing? N N  Vision? Tempie Donning  Comment Managed by eye doctor left eye blindness  Difficulty concentrating or making decisions? N N  Walking or climbing stairs? Y Y  Comment Uses walker in public; cane at home uses a cane  Dressing or bathing? N N  Doing errands, shopping? Y Y  Comment Sister aids sister assists  Preparing Food and eating ? Y N  Comment Preparing - sister aids -  Using the Toilet? N N  In the past six months, have you accidently leaked urine? Y N  Comment Nocturia; takes fluid pills -  Do you have problems with loss of bowel control? N  N  Managing your Medications? N Y  Comment - sister picks up meds but member able to take own meds  Managing your Finances? N Y  Comment - sister assists with writing money orders  Housekeeping or managing your Housekeeping? N Y  Comment - sister assists  Some recent data might be hidden    Patient Care Team: Ladell Pier, MD as PCP - General (Internal Medicine) Skeet Latch, MD as PCP - Cardiology (Cardiology) Hillary Bow, MD as Consulting Physician (Cardiology) Juanita Craver, MD as Consulting Physician (Gastroenterology)   Assessment:   This is a routine wellness examination for [place].  Exercise Activities and Dietary recommendations Current Exercise Habits: The patient does not participate in regular exercise at present, Exercise limited by: orthopedic condition(s)  Goals   None     Fall Risk Fall Risk  01/13/2020 10/17/2019 03/17/2019 09/24/2018 10/13/2017  Falls in the past year? 0 0 0 0 No  Number falls in past yr: 0 0 0 - -  Injury with Fall? 0 0 - - -  Risk for fall due to : - - - - Impaired balance/gait  Follow up Falls evaluation completed - - - -   Is the patient's home free of loose throw rugs in walkways, pet beds, electrical cords, etc?   yes      Grab bars in the bathroom? no      Handrails on the stairs?   no      Adequate lighting?   no  Timed Get Up and Go Performed: 5 seconds  Depression Screen PHQ 2/9 Scores 01/13/2020 10/17/2019 03/17/2019 09/24/2018  PHQ - 2 Score 0 0 0 0  PHQ- 9 Score - 0 - 2  Exception Documentation - - (No Data) -    Cognitive Function MMSE - Mini Mental State Exam 01/13/2020  Orientation to time 5  Orientation to Place 5  Registration 3  Attention/ Calculation 5  Recall 3  Language- name 2 objects 2  Language- repeat 1  Language- follow 3 step command 3  Language- read & follow direction 1  Write a sentence 1  Copy design 1  Total score 30        Immunization History  Administered Date(s) Administered    . Influenza,inj,Quad PF,6+ Mos 05/19/2014, 06/09/2017, 06/24/2018, 07/12/2019  . Pneumococcal Polysaccharide-23 05/19/2014  . Tdap 03/26/2015    Screening Tests Health Maintenance  Topic Date Due  . COVID-19 Vaccine (1) Never done  . INFLUENZA VACCINE  03/18/2020  . HEMOGLOBIN A1C  04/18/2020  . FOOT EXAM  10/30/2020  . OPHTHALMOLOGY EXAM  01/10/2021  . TETANUS/TDAP  03/25/2025  . COLONOSCOPY  01/23/2027  . PNEUMOCOCCAL POLYSACCHARIDE VACCINE AGE 1-64 HIGH RISK  Completed  . Hepatitis C Screening  Completed  . 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  I have personally reviewed and noted the following in the patient's chart:   . Medical and social history . Use of alcohol, tobacco or illicit drugs  . Current medications and supplements . Functional ability and status . Nutritional status . Physical activity . Advanced directives . List of other physicians . Hospitalizations, surgeries, and ER visits in previous 12 months . Vitals . Screenings to include cognitive, depression, and falls . Referrals and appointments  In addition, I have reviewed and discussed with patient certain preventive protocols, quality metrics, and best practice recommendations. A written personalized care plan for preventive services as well as general preventive health recommendations were provided to patient.     Tresa Endo, RPH-CPP  01/13/2020

## 2020-02-01 ENCOUNTER — Other Ambulatory Visit: Payer: Self-pay

## 2020-02-01 ENCOUNTER — Ambulatory Visit (INDEPENDENT_AMBULATORY_CARE_PROVIDER_SITE_OTHER): Payer: Medicare Other | Admitting: Ophthalmology

## 2020-02-01 ENCOUNTER — Encounter (INDEPENDENT_AMBULATORY_CARE_PROVIDER_SITE_OTHER): Payer: Self-pay | Admitting: Ophthalmology

## 2020-02-01 DIAGNOSIS — E113511 Type 2 diabetes mellitus with proliferative diabetic retinopathy with macular edema, right eye: Secondary | ICD-10-CM | POA: Diagnosis not present

## 2020-02-01 NOTE — Progress Notes (Signed)
02/01/2020     CHIEF COMPLAINT Patient presents for Retina Follow Up   HISTORY OF PRESENT ILLNESS: Derek Lowery is a 64 y.o. male who presents to the clinic today for:   HPI    Retina Follow Up    Patient presents with  Diabetic Retinopathy.  In right eye.  Severity is moderate.  Duration of 3 weeks.  Since onset it is stable.  I, the attending physician,  performed the HPI with the patient and updated documentation appropriately.          Comments    3 Week PDR f\u OD. PRP OD  Pt states vision is stable. Denies any complaints. Using gtts as directed. BGL: did not check       Last edited by Tilda Franco on 02/01/2020  9:50 AM. (History)      Referring physician: Ladell Pier, MD [place],  [place] [address]  HISTORICAL INFORMATION:   Selected notes from the MEDICAL RECORD NUMBER    Lab Results  Component Value Date   HGBA1C 11.8 (A) 10/17/2019     CURRENT MEDICATIONS: Current Outpatient Medications (Ophthalmic Drugs)  Medication Sig  . brimonidine (ALPHAGAN) 0.2 % ophthalmic solution INSTILL 1 DROP INTO RIGHT EYE TWICE A DAY  . dorzolamide (TRUSOPT) 2 % ophthalmic solution Place 1 drop into the right eye 2 (two) times daily.  Marland Kitchen latanoprost (XALATAN) 0.005 % ophthalmic solution Place 1 drop into the right eye at bedtime.  . timolol (BETIMOL) 0.5 % ophthalmic solution Place 1 drop into the right eye 2 (two) times daily.  Marland Kitchen VYZULTA 0.024 % SOLN Place 1 drop into the right eye at bedtime.   No current facility-administered medications for this visit. (Ophthalmic Drugs)   Current Outpatient Medications (Other)  Medication Sig  . ammonium lactate (AMLACTIN) 12 % lotion Apply to both feet twice daily for dry skin.  Marland Kitchen aspirin EC 81 MG tablet Take 1 tablet (81 mg total) by mouth daily.  Marland Kitchen atorvastatin (LIPITOR) 40 MG tablet TAKE 1 TABLET BY MOUTH EVERY DAY  . blood glucose meter kit and supplies KIT Dispense based on patient and insurance  preference. Use up to four times daily as directed. One Touch Verio  . Continuous Blood Gluc Sensor (FREESTYLE LIBRE SENSOR SYSTEM) MISC Change sensor Q 2 wks  . furosemide (LASIX) 20 MG tablet TAKE 1 TABLET (20 MG TOTAL) BY MOUTH DAILY. STOP LISINOPRIL/HCTZ  . glucose blood (ACCU-CHEK AVIVA PLUS) test strip Use as instructed for 3 times daily testing of blood sugar. E11.9  . glucose monitoring kit (FREESTYLE) monitoring kit 1 each by Does not apply route as needed for other.  . hydrochlorothiazide (HYDRODIURIL) 12.5 MG tablet TAKE 1 TABLET BY MOUTH EVERY DAY  . Insulin Glargine (LANTUS SOLOSTAR) 100 UNIT/ML Solostar Pen INJECT 34 UNITS INTO THE SKIN DAILY.  Marland Kitchen insulin lispro (HUMALOG KWIKPEN) 100 UNIT/ML KwikPen 10 units TID with meals  . Insulin Pen Needle (BD PEN NEEDLE NANO U/F) 32G X 4 MM MISC USE AS DIRECTED 3 TIMES A DAY. Please schedule an appointment for additional refills.  . Lancets MISC Use as directed.  Accu chek 2  . lisinopril (ZESTRIL) 20 MG tablet Take 1 tablet (20 mg total) by mouth daily.  . Multiple Vitamins-Minerals (MULTIVITAMIN WITH MINERALS) tablet Take 1 tablet by mouth daily.   No current facility-administered medications for this visit. (Other)      REVIEW OF SYSTEMS: ROS    Positive for: Endocrine  Last edited by Tilda Franco on 02/01/2020  9:50 AM. (History)       ALLERGIES No Known Allergies  PAST MEDICAL HISTORY Past Medical History:  Diagnosis Date  . Arthritis   . CAD (coronary artery disease)    NSTEMI 06/2011:  LHC 07/18/11: Proximal diagonal 60%, distal LAD with a diabetic appearance and 60% stenosis, OM2 with an occluded superior branch and an inferior branch with 90%, EF 55% with inferior hypokinesis.  PCI: Promus DES to the OM2 inferior branch.  This vessel provides collaterals to the superior branch which remained occluded.  Echocardiogram 07/18/11: EF 60%, normal wall motion.  . Cataract   . Essential hypertension, benign   . Glaucoma    . Hypercholesteremia   . Internal hemorrhoids   . Noncompliance   . Type 2 diabetes mellitus ([place]) 1990   Past Surgical History:  Procedure Laterality Date  . CATARACT EXTRACTION Right 2019   Dr. Katy Fitch  . CHOLECYSTECTOMY N/A 09/21/2017   Procedure: LAPAROSCOPIC CHOLECYSTECTOMY WITH INTRAOPERATIVE CHOLANGIOGRAM;  Surgeon: Michael Boston, MD;  Location: [facility];  Service: General;  Laterality: N/A;  . COLONOSCOPY  2000   Dr. Collene Mares  . EYE SURGERY    . IRRIGATION AND DEBRIDEMENT ABSCESS Left 05/05/2014   Procedure: IRRIGATION AND DEBRIDEMENT ABSCESS left buttock;  Surgeon: Michael Boston, MD;  Location: [facility];  Service: General;  Laterality: Left;  . LEFT HEART CATHETERIZATION WITH CORONARY ANGIOGRAM N/A 07/18/2011   Procedure: LEFT HEART CATHETERIZATION WITH CORONARY ANGIOGRAM;  Surgeon: Hillary Bow, MD;  [facility] CATH LAB;  Service: Cardiovascular;  Laterality: N/A;  . PERCUTANEOUS CORONARY STENT INTERVENTION (PCI-S)  07/18/2011   Procedure: PERCUTANEOUS CORONARY STENT INTERVENTION (PCI-S);  Surgeon: Hillary Bow, MD;  Location: [facility] CATH LAB;  Service: Cardiovascular;;    FAMILY HISTORY Family History  Problem Relation Age of Onset  . Coronary artery disease Father        Developed in his 27s  . Kidney disease Father   . Diabetes Father   . Melanoma Father   . Rectal cancer Father   . Heart failure Mother   . Hypertension Mother   . Diabetes Sister   . Diabetes Brother   . Colon cancer Neg Hx   . Esophageal cancer Neg Hx   . Stomach cancer Neg Hx     SOCIAL HISTORY Social History   Tobacco Use  . Smoking status: Never Smoker  . Smokeless tobacco: Never Used  Vaping Use  . Vaping Use: Never used  Substance Use Topics  . Alcohol use: No  . Drug use: No         OPHTHALMIC EXAM:  Base Eye Exam    Visual Acuity (Snellen - Linear)      Right Left   Dist  20/25 -2 NLP       Tonometry (Tonopen, 9:53 AM)      Right Left   Pressure 14 28       Pupils       Pupils Dark Light Shape React APD   Right PERRL 2 2 Irregular Minimal None   Left PERRL 2 2 Round Minimal None       Neuro/Psych    Oriented x3: Yes   Mood/Affect: Normal       Dilation    Right eye: 1.0% Mydriacyl, 2.5% Phenylephrine @ 9:53 AM        Slit Lamp and Fundus Exam    External Exam      Right Left  External Normal Normal       Slit Lamp Exam      Right Left   Lids/Lashes Normal Normal   Conjunctiva/Sclera White and quiet White and quiet   Cornea Clear Clear   Anterior Chamber Deep and quiet Deep   Iris Old NVI large trunks, no signs of new  NVI Old neovascularization of the iris 360 degrees, stable nonprogressive   Lens Posterior chamber intraocular lens 3+ Nuclear sclerosis       Fundus Exam      Right Left   Posterior Vitreous Posterior vitreous detachment No view   Disc Normal Normal   C/D Ratio 0.5    Macula Microaneurysms, no macular thickening, no clinically significant macular edema    Vessels Left of diabetic retinopathy, quiet    Periphery Normal, good panretinal photocoagulation, room posteriorly from arcade to the mid periphery for additional PRP, and hopefully this will decrease vegF load           IMAGING AND PROCEDURES  Imaging and Procedures for 02/01/20  Panretinal Photocoagulation - OD - Right Eye       Time Out Confirmed correct patient, procedure, site, and patient consented.   Anesthesia Topical anesthesia was used. Anesthetic medications included Proparacaine 0.5%.   Laser Information The type of laser was diode. Color was yellow. The duration in seconds was 0.02. The spot size was 390 microns. Laser power was 280. Total spots was 1125.   Post-op The patient tolerated the procedure well. There were no complications. The patient received written and verbal post procedure care education.                 ASSESSMENT/PLAN:  Diabetic macular edema of right eye with proliferative retinopathy associated with type  2 diabetes mellitus (HCC) PRP OD today in order to diminish antivegF burden.  We will follow-up with probable injection of Avastin because of the recurrent iris neovascularization      ICD-10-CM   1. Proliferative diabetic retinopathy of right eye with macular edema associated with type 2 diabetes mellitus ([place])  T06.2694 Panretinal Photocoagulation - OD - Right Eye  2. Diabetic macular edema of right eye with proliferative retinopathy associated with type 2 diabetes mellitus ([place])  E11.3511     1.  PRP OD added today to regions of peripheral light therapy or no therapy particularly anteriorly  2.  We will continue with intravitreal Avastin to control the anterior segment neovascularization and will monitor for improvement in this condition in an attempt to increase duration of examination intervals  3.  Ophthalmic Meds Ordered this visit:  No orders of the defined types were placed in this encounter.      Return in about 2 weeks (around 02/15/2020) for AVASTIN OCT, OD.  There are no Patient Instructions on file for this visit.   Explained the diagnoses, plan, and follow up with the patient and they expressed understanding.  Patient expressed understanding of the importance of proper follow up care.   Clent Demark Nicholas Trompeter M.D. Diseases & Surgery of the Retina and Vitreous Retina & Diabetic [place] 02/01/20     Abbreviations: M myopia (nearsighted); A astigmatism; H hyperopia (farsighted); P presbyopia; Mrx spectacle prescription;  CTL contact lenses; OD right eye; OS left eye; OU both eyes  XT exotropia; ET esotropia; PEK punctate epithelial keratitis; PEE punctate epithelial erosions; DES dry eye syndrome; MGD meibomian gland dysfunction; ATs artificial tears; PFAT's preservative free artificial tears; [place] nuclear sclerotic cataract; PSC posterior subcapsular cataract;  ERM epi-retinal membrane; PVD posterior vitreous detachment; RD retinal detachment; DM diabetes mellitus; DR  diabetic retinopathy; NPDR non-proliferative diabetic retinopathy; PDR proliferative diabetic retinopathy; CSME clinically significant macular edema; DME diabetic macular edema; dbh dot blot hemorrhages; CWS cotton wool spot; POAG primary open angle glaucoma; C/D cup-to-disc ratio; HVF humphrey visual field; GVF goldmann visual field; OCT optical coherence tomography; IOP intraocular pressure; BRVO Branch retinal vein occlusion; CRVO central retinal vein occlusion; CRAO central retinal artery occlusion; BRAO branch retinal artery occlusion; RT retinal tear; SB scleral buckle; PPV pars plana vitrectomy; VH Vitreous hemorrhage; PRP panretinal laser photocoagulation; IVK intravitreal kenalog; VMT vitreomacular traction; MH Macular hole;  NVD neovascularization of the disc; NVE neovascularization elsewhere; AREDS age related eye disease study; ARMD age related macular degeneration; POAG primary open angle glaucoma; EBMD epithelial/anterior basement membrane dystrophy; ACIOL anterior chamber intraocular lens; IOL intraocular lens; PCIOL posterior chamber intraocular lens; Phaco/IOL phacoemulsification with intraocular lens placement; [place] photorefractive keratectomy; LASIK laser assisted in situ keratomileusis; HTN hypertension; DM diabetes mellitus; COPD chronic obstructive pulmonary disease

## 2020-02-01 NOTE — Assessment & Plan Note (Signed)
PRP OD today in order to diminish antivegF burden.  We will follow-up with probable injection of Avastin because of the recurrent iris neovascularization

## 2020-02-22 ENCOUNTER — Ambulatory Visit (INDEPENDENT_AMBULATORY_CARE_PROVIDER_SITE_OTHER): Payer: Medicare Other | Admitting: Ophthalmology

## 2020-02-22 ENCOUNTER — Encounter (INDEPENDENT_AMBULATORY_CARE_PROVIDER_SITE_OTHER): Payer: Self-pay | Admitting: Ophthalmology

## 2020-02-22 ENCOUNTER — Other Ambulatory Visit: Payer: Self-pay

## 2020-02-22 DIAGNOSIS — E113511 Type 2 diabetes mellitus with proliferative diabetic retinopathy with macular edema, right eye: Secondary | ICD-10-CM

## 2020-02-22 MED ORDER — BEVACIZUMAB CHEMO INJECTION 1.25MG/0.05ML SYRINGE FOR KALEIDOSCOPE
1.2500 mg | INTRAVITREAL | Status: AC | PRN
  Administered 2020-02-22: 1.25 mg via INTRAVITREAL

## 2020-02-22 NOTE — Assessment & Plan Note (Signed)
OD with much less CSME and much less iris neovascularization now 6 weeks status post intravitreal Avastin, and 3 weeks status post additional PRP.  We will repeat intravitreal Avastin OD today and examination in 7 weeks

## 2020-02-22 NOTE — Progress Notes (Signed)
02/22/2020     CHIEF COMPLAINT Patient presents for Retina Follow Up   HISTORY OF PRESENT ILLNESS: Derek Lowery is a 64 y.o. male who presents to the clinic today for:   HPI    Retina Follow Up    Patient presents with  Diabetic Retinopathy.  In right eye.  This started 6 weeks ago.  Severity is mild.  Duration of 6 weeks.  Since onset it is stable.          Comments    6 Week PDR F/U OD, poss Avastin OD  Pt denies noticeable changes to [place] OU since last visit. Pt denies ocular pain, flashes of light, or floaters OU.  LBS: does not check       Last edited by Rockie Neighbours, [place] on 02/22/2020  9:32 AM. (History)      Referring physician: Ladell Pier, MD [place],   [id number]  HISTORICAL INFORMATION:   Selected notes from the MEDICAL RECORD NUMBER    Lab Results  Component Value Date   HGBA1C 11.8 (A) 10/17/2019     CURRENT MEDICATIONS: Current Outpatient Medications (Ophthalmic Drugs)  Medication Sig  . brimonidine (ALPHAGAN) 0.2 % ophthalmic solution INSTILL 1 DROP INTO RIGHT EYE TWICE A DAY  . dorzolamide (TRUSOPT) 2 % ophthalmic solution Place 1 drop into the right eye 2 (two) times daily.  Marland Kitchen latanoprost (XALATAN) 0.005 % ophthalmic solution Place 1 drop into the right eye at bedtime.  . RHOPRESSA 0.02 % SOLN Place 1 drop into the right eye at bedtime.  . timolol (BETIMOL) 0.5 % ophthalmic solution Place 1 drop into the right eye 2 (two) times daily.  Marland Kitchen VYZULTA 0.024 % SOLN Place 1 drop into the right eye at bedtime.   No current facility-administered medications for this visit. (Ophthalmic Drugs)   Current Outpatient Medications (Other)  Medication Sig  . ammonium lactate (AMLACTIN) 12 % lotion Apply to both feet twice daily for dry skin.  Marland Kitchen aspirin EC 81 MG tablet Take 1 tablet (81 mg total) by mouth daily.  Marland Kitchen atorvastatin (LIPITOR) 40 MG tablet TAKE 1 TABLET BY MOUTH EVERY DAY  . blood glucose meter kit and supplies KIT  Dispense based on patient and insurance preference. Use up to four times daily as directed. One Touch Verio  . Continuous Blood Gluc Sensor (FREESTYLE LIBRE SENSOR SYSTEM) MISC Change sensor Q 2 wks  . furosemide (LASIX) 20 MG tablet TAKE 1 TABLET (20 MG TOTAL) BY MOUTH DAILY. STOP LISINOPRIL/HCTZ  . glucose blood (ACCU-CHEK AVIVA PLUS) test strip Use as instructed for 3 times daily testing of blood sugar. E11.9  . glucose monitoring kit (FREESTYLE) monitoring kit 1 each by Does not apply route as needed for other.  . hydrochlorothiazide (HYDRODIURIL) 12.5 MG tablet TAKE 1 TABLET BY MOUTH EVERY DAY  . Insulin Glargine (LANTUS SOLOSTAR) 100 UNIT/ML Solostar Pen INJECT 34 UNITS INTO THE SKIN DAILY.  Marland Kitchen insulin lispro (HUMALOG KWIKPEN) 100 UNIT/ML KwikPen 10 units TID with meals  . Insulin Pen Needle (BD PEN NEEDLE NANO U/F) 32G X 4 MM MISC USE AS DIRECTED 3 TIMES A DAY. Please schedule an appointment for additional refills.  . Lancets MISC Use as directed.  Accu chek 2  . lisinopril (ZESTRIL) 20 MG tablet Take 1 tablet (20 mg total) by mouth daily.  . Multiple Vitamins-Minerals (MULTIVITAMIN WITH MINERALS) tablet Take 1 tablet by mouth daily.   No current facility-administered medications for this visit. (Other)  REVIEW OF SYSTEMS:    ALLERGIES No Known Allergies  PAST MEDICAL HISTORY Past Medical History:  Diagnosis Date  . Arthritis   . CAD (coronary artery disease)    NSTEMI 06/2011:  LHC 07/18/11: Proximal diagonal 60%, distal LAD with a diabetic appearance and 60% stenosis, OM2 with an occluded superior branch and an inferior branch with 90%, EF 55% with inferior hypokinesis.  PCI: Promus DES to the OM2 inferior branch.  This vessel provides collaterals to the superior branch which remained occluded.  Echocardiogram 07/18/11: EF 60%, normal wall motion.  . Cataract   . Essential hypertension, benign   . Glaucoma   . Hypercholesteremia   . Internal hemorrhoids   .  Noncompliance   . Type 2 diabetes mellitus ([place]) 1990   Past Surgical History:  Procedure Laterality Date  . CATARACT EXTRACTION Right 2019   Dr. Katy Fitch  . CHOLECYSTECTOMY N/A 09/21/2017   Procedure: LAPAROSCOPIC CHOLECYSTECTOMY WITH INTRAOPERATIVE CHOLANGIOGRAM;  Surgeon: Michael Boston, MD;  Location: [facility];  Service: General;  Laterality: N/A;  . COLONOSCOPY  2000   Dr. Collene Mares  . EYE SURGERY    . IRRIGATION AND DEBRIDEMENT ABSCESS Left 05/05/2014   Procedure: IRRIGATION AND DEBRIDEMENT ABSCESS left buttock;  Surgeon: Michael Boston, MD;  Location: [facility];  Service: General;  Laterality: Left;  . LEFT HEART CATHETERIZATION WITH CORONARY ANGIOGRAM N/A 07/18/2011   Procedure: LEFT HEART CATHETERIZATION WITH CORONARY ANGIOGRAM;  Surgeon: Hillary Bow, MD;  Location[facility] CATH LAB;  Service: Cardiovascular;  Laterality: N/A;  . PERCUTANEOUS CORONARY STENT INTERVENTION (PCI-S)  07/18/2011   Procedure: PERCUTANEOUS CORONARY STENT INTERVENTION (PCI-S);  Surgeon: Hillary Bow, MD;  Location: [facility] CATH LAB;  Service: Cardiovascular;;    FAMILY HISTORY Family History  Problem Relation Age of Onset  . Coronary artery disease Father        Developed in his 17s  . Kidney disease Father   . Diabetes Father   . Melanoma Father   . Rectal cancer Father   . Heart failure Mother   . Hypertension Mother   . Diabetes Sister   . Diabetes Brother   . Colon cancer Neg Hx   . Esophageal cancer Neg Hx   . Stomach cancer Neg Hx     SOCIAL HISTORY Social History   Tobacco Use  . Smoking status: Never Smoker  . Smokeless tobacco: Never Used  Vaping Use  . Vaping Use: Never used  Substance Use Topics  . Alcohol use: No  . Drug use: No         OPHTHALMIC EXAM:  Base Eye Exam    Visual Acuity (ETDRS)      Right Left   Dist [place] 20/30 -1 NLP   Dist ph [place] NI        Tonometry (Tonopen, 9:37 AM)      Right Left   Pressure 14 37       Pupils      Dark Light Shape React APD   Right 3 2  Irregular Brisk None   Left 3 2 Round Brisk +1       Visual Fields (Counting fingers)      Left Right   Restrictions Total superior temporal, inferior temporal, superior nasal, inferior nasal deficiencies Total superior temporal, inferior temporal, superior nasal deficiencies       Extraocular Movement      Right Left    Full Full       Neuro/Psych    Oriented x3:  Yes   Mood/Affect: Normal       Dilation    Right eye: 1.0% Mydriacyl, 2.5% Phenylephrine @ 9:37 AM        Slit Lamp and Fundus Exam    External Exam      Right Left   External Normal Normal       Slit Lamp Exam      Right Left   Lids/Lashes Normal Normal   Conjunctiva/Sclera White and quiet White and quiet   Cornea Clear Clear   Anterior Chamber Deep and quiet Deep   Iris Old NVI large trunks, no signs of new  NVI Old neovascularization of the iris 360 degrees, stable nonprogressive   Lens Posterior chamber intraocular lens 3+ Nuclear sclerosis   Anterior Vitreous Normal        Fundus Exam      Right Left   Posterior Vitreous Posterior vitreous detachment No view   Disc Normal Normal   C/D Ratio 0.6    Macula Microaneurysms, no macular thickening, no clinically significant macular edema    Vessels Left of diabetic retinopathy, quiet    Periphery  good panretinal photocoagulation, room posteriorly from arcade to the mid periphery for additional PRP, and hopefully this will decrease vegF load           IMAGING AND PROCEDURES  Imaging and Procedures for 02/22/20  OCT, Retina - OU - Both Eyes       Right Eye Quality was good. Scan locations included subfoveal. Central Foveal Thickness: 293. Progression has improved.   Notes OD, CSME much improved on intravitreal Avastin.       Intravitreal Injection, Pharmacologic Agent - OD - Right Eye       Time Out 02/22/2020. 10:28 AM. Confirmed correct patient, procedure, site, and patient consented.   Anesthesia Topical anesthesia was used.  Anesthetic medications included [place] 3.5%.   Procedure Preparation included Tobramycin 0.3%, 10% betadine to eyelids. A 30 gauge needle was used.   Injection:  1.25 mg Bevacizumab (AVASTIN) SOLN   NDC: [address]-6, Lot: [id number]   Route: Intravitreal, Site: Right Eye, Waste: 0 mg  Post-op Post injection exam found visual acuity of at least counting fingers. The patient tolerated the procedure well. There were no complications. The patient received written and verbal post procedure care education. Post injection medications were not given.                 ASSESSMENT/PLAN:  Proliferative diabetic retinopathy of right eye with macular edema associated with type 2 diabetes mellitus (HCC) OD with much less CSME and much less iris neovascularization now 6 weeks status post intravitreal Avastin, and 3 weeks status post additional PRP.  We will repeat intravitreal Avastin OD today and examination in 7 weeks      ICD-10-CM   1. Proliferative diabetic retinopathy of right eye with macular edema associated with type 2 diabetes mellitus (HCC)  E11.3511 OCT, Retina - OU - Both Eyes    Intravitreal Injection, Pharmacologic Agent - OD - Right Eye    Bevacizumab (AVASTIN) SOLN 1.25 mg    1.  2.  3.  Ophthalmic Meds Ordered this visit:  Meds ordered this encounter  Medications  . Bevacizumab (AVASTIN) SOLN 1.25 mg       Return in about 7 weeks (around 04/11/2020) for dilate, OD, AVASTIN OCT.  There are no Patient Instructions on file for this visit.   Explained the diagnoses, plan, and follow up with the patient and they expressed  understanding.  Patient expressed understanding of the importance of proper follow up care.   Clent Demark Jabez Molner M.D. Diseases & Surgery of the Retina and Vitreous Retina & Diabetic [place] 02/22/20     Abbreviations: M myopia (nearsighted); A astigmatism; H hyperopia (farsighted); P presbyopia; Mrx spectacle prescription;  CTL contact lenses;  OD right eye; OS left eye; OU both eyes  XT exotropia; ET esotropia; PEK punctate epithelial keratitis; PEE punctate epithelial erosions; DES dry eye syndrome; MGD meibomian gland dysfunction; ATs artificial tears; PFAT's preservative free artificial tears; [place] nuclear sclerotic cataract; PSC posterior subcapsular cataract; ERM epi-retinal membrane; PVD posterior vitreous detachment; RD retinal detachment; DM diabetes mellitus; DR diabetic retinopathy; NPDR non-proliferative diabetic retinopathy; PDR proliferative diabetic retinopathy; CSME clinically significant macular edema; DME diabetic macular edema; dbh dot blot hemorrhages; CWS cotton wool spot; POAG primary open angle glaucoma; C/D cup-to-disc ratio; HVF humphrey visual field; GVF goldmann visual field; OCT optical coherence tomography; IOP intraocular pressure; BRVO Branch retinal vein occlusion; CRVO central retinal vein occlusion; CRAO central retinal artery occlusion; BRAO branch retinal artery occlusion; RT retinal tear; SB scleral buckle; PPV pars plana vitrectomy; VH Vitreous hemorrhage; PRP panretinal laser photocoagulation; IVK intravitreal kenalog; VMT vitreomacular traction; MH Macular hole;  NVD neovascularization of the disc; NVE neovascularization elsewhere; AREDS age related eye disease study; ARMD age related macular degeneration; POAG primary open angle glaucoma; EBMD epithelial/anterior basement membrane dystrophy; ACIOL anterior chamber intraocular lens; IOL intraocular lens; PCIOL posterior chamber intraocular lens; Phaco/IOL phacoemulsification with intraocular lens placement; [place] photorefractive keratectomy; LASIK laser assisted in situ keratomileusis; HTN hypertension; DM diabetes mellitus; COPD chronic obstructive pulmonary disease

## 2020-03-07 ENCOUNTER — Ambulatory Visit: Payer: Medicare Other | Admitting: Orthotics

## 2020-03-09 ENCOUNTER — Other Ambulatory Visit: Payer: Self-pay | Admitting: Internal Medicine

## 2020-03-09 DIAGNOSIS — E785 Hyperlipidemia, unspecified: Secondary | ICD-10-CM

## 2020-03-12 ENCOUNTER — Other Ambulatory Visit: Payer: Self-pay | Admitting: Internal Medicine

## 2020-03-12 DIAGNOSIS — Z794 Long term (current) use of insulin: Secondary | ICD-10-CM

## 2020-03-12 DIAGNOSIS — E1142 Type 2 diabetes mellitus with diabetic polyneuropathy: Secondary | ICD-10-CM

## 2020-03-19 ENCOUNTER — Encounter: Payer: Self-pay | Admitting: Podiatry

## 2020-03-19 ENCOUNTER — Ambulatory Visit (INDEPENDENT_AMBULATORY_CARE_PROVIDER_SITE_OTHER): Payer: Medicare Other | Admitting: Podiatry

## 2020-03-19 ENCOUNTER — Ambulatory Visit (INDEPENDENT_AMBULATORY_CARE_PROVIDER_SITE_OTHER): Payer: Medicare Other | Admitting: Orthotics

## 2020-03-19 ENCOUNTER — Other Ambulatory Visit: Payer: Self-pay

## 2020-03-19 DIAGNOSIS — L84 Corns and callosities: Secondary | ICD-10-CM

## 2020-03-19 DIAGNOSIS — L97511 Non-pressure chronic ulcer of other part of right foot limited to breakdown of skin: Secondary | ICD-10-CM

## 2020-03-19 DIAGNOSIS — E1142 Type 2 diabetes mellitus with diabetic polyneuropathy: Secondary | ICD-10-CM

## 2020-03-19 DIAGNOSIS — M79675 Pain in left toe(s): Secondary | ICD-10-CM

## 2020-03-19 DIAGNOSIS — M79674 Pain in right toe(s): Secondary | ICD-10-CM | POA: Diagnosis not present

## 2020-03-19 DIAGNOSIS — B351 Tinea unguium: Secondary | ICD-10-CM | POA: Diagnosis not present

## 2020-03-19 NOTE — Progress Notes (Signed)

## 2020-03-19 NOTE — Progress Notes (Signed)
Subjective:  Patient ID: EIN RIJO, male    DOB: Apr 10, 1956,  MRN: [id number]  64 y.o. male presents with at risk foot care with history of diabetic neuropathy and painful callus(es) plantar aspect of the right foot and painful thick toenails that are difficult to trim. Painful toenails interfere with ambulation. Aggravating factors include wearing enclosed shoe gear. Pain is relieved with periodic professional debridement. Painful calluses are aggravated when weightbearing with and without shoegear. Pain is relieved with periodic professional debridement.   He states he is picking up his diabetic shoes on today's visit.  He voices no new pedal problems on today's visit.  Review of Systems: Negative except as noted in the HPI.  Past Medical History:  Diagnosis Date  . Arthritis   . CAD (coronary artery disease)    NSTEMI 06/2011:  LHC 07/18/11: Proximal diagonal 60%, distal LAD with a diabetic appearance and 60% stenosis, OM2 with an occluded superior branch and an inferior branch with 90%, EF 55% with inferior hypokinesis.  PCI: Promus DES to the OM2 inferior branch.  This vessel provides collaterals to the superior branch which remained occluded.  Echocardiogram 07/18/11: EF 60%, normal wall motion.  . Cataract   . Essential hypertension, benign   . Glaucoma   . Hypercholesteremia   . Internal hemorrhoids   . Noncompliance   . Type 2 diabetes mellitus ([place]) 1990   Past Surgical History:  Procedure Laterality Date  . CATARACT EXTRACTION Right 2019   Dr. Katy Fitch  . CHOLECYSTECTOMY N/A 09/21/2017   Procedure: LAPAROSCOPIC CHOLECYSTECTOMY WITH INTRAOPERATIVE CHOLANGIOGRAM;  Surgeon: Michael Boston, MD;  Location: [facility];  Service: General;  Laterality: N/A;  . COLONOSCOPY  2000   Dr. Collene Mares  . EYE SURGERY    . IRRIGATION AND DEBRIDEMENT ABSCESS Left 05/05/2014   Procedure: IRRIGATION AND DEBRIDEMENT ABSCESS left buttock;  Surgeon: Michael Boston, MD;  Location: [facility];  Service: General;   Laterality: Left;  . LEFT HEART CATHETERIZATION WITH CORONARY ANGIOGRAM N/A 07/18/2011   Procedure: LEFT HEART CATHETERIZATION WITH CORONARY ANGIOGRAM;  Surgeon: Hillary Bow, MD;  Location: [facility] CATH LAB;  Service: Cardiovascular;  Laterality: N/A;  . PERCUTANEOUS CORONARY STENT INTERVENTION (PCI-S)  07/18/2011   Procedure: PERCUTANEOUS CORONARY STENT INTERVENTION (PCI-S);  Surgeon: Hillary Bow, MD;  Location: [facility] CATH LAB;  Service: Cardiovascular;;   Patient Active Problem List   Diagnosis Date Noted  . Proliferative diabetic retinopathy of right eye with macular edema associated with type 2 diabetes mellitus ([place]) 11/30/2019  . Rubeosis iridis of right eye 11/30/2019  . Diabetic visual loss better eye: severe vision impairment; lesser eye: blind, without macular edema, with proliferative retinopathy, associated with type 2 diabetes mellitus ([place]) 11/30/2019  . Glaucoma with pupillary seclusion, unspecified laterality, severe stage 11/30/2019  . Trigger middle finger of right hand 10/17/2019  . Pain due to onychomycosis of toenails of both feet 08/07/2019  . Educated about COVID-19 virus infection 12/02/2018  . RBBB 12/02/2018  . Benign paroxysmal positional vertigo 06/25/2018  . Pre-ulcerative corn or callous 10/13/2017  . Anemia 10/13/2017  . Chronic diarrhea 06/09/2017  . Microalbuminuria 03/07/2017  . Dry skin 03/05/2017  . Glaucoma 03/05/2017  . Legally blind 03/05/2017  . Retinopathy due to secondary diabetes ([place]) 02/06/2016  . Diabetic polyneuropathy associated with type 2 diabetes mellitus ([place]) 01/12/2015  . Fournier's gangrene into true pelvis s/p I&D 05/05/2014 05/05/2014  . Type 2 diabetes mellitus with complication, with long-term current use of insulin ([place]) 07/17/2011  .  HLD (hyperlipidemia) 07/17/2011  . Coronary artery disease of native artery of native heart with stable angina pectoris ([place]) 07/17/2011  . Essential hypertension 07/17/2011    Current  Outpatient Medications:  .  ammonium lactate (AMLACTIN) 12 % lotion, Apply to both feet twice daily for dry skin., Disp: 400 g, Rfl: 6 .  aspirin EC 81 MG tablet, Take 1 tablet (81 mg total) by mouth daily., Disp: 30 tablet, Rfl: 4 .  atorvastatin (LIPITOR) 40 MG tablet, TAKE 1 TABLET BY MOUTH EVERY DAY, Disp: 90 tablet, Rfl: 1 .  blood glucose meter kit and supplies KIT, Dispense based on patient and insurance preference. Use up to four times daily as directed. One Touch Verio, Disp: 1 each, Rfl: 0 .  brimonidine (ALPHAGAN) 0.2 % ophthalmic solution, INSTILL 1 DROP INTO RIGHT EYE TWICE A DAY, Disp: , Rfl:  .  Continuous Blood Gluc Sensor (FREESTYLE LIBRE SENSOR SYSTEM) MISC, Change sensor Q 2 wks, Disp: 2 each, Rfl: 12 .  dorzolamide (TRUSOPT) 2 % ophthalmic solution, Place 1 drop into the right eye 2 (two) times daily., Disp: , Rfl:  .  furosemide (LASIX) 20 MG tablet, TAKE 1 TABLET (20 MG TOTAL) BY MOUTH DAILY. STOP LISINOPRIL/HCTZ, Disp: 90 tablet, Rfl: 1 .  glucose blood (ACCU-CHEK AVIVA PLUS) test strip, Use as instructed for 3 times daily testing of blood sugar. E11.9, Disp: 100 each, Rfl: 6 .  glucose monitoring kit (FREESTYLE) monitoring kit, 1 each by Does not apply route as needed for other., Disp: 1 each, Rfl: 0 .  hydrochlorothiazide (HYDRODIURIL) 12.5 MG tablet, TAKE 1 TABLET BY MOUTH EVERY DAY, Disp: 90 tablet, Rfl: 1 .  insulin lispro (HUMALOG KWIKPEN) 100 UNIT/ML KwikPen, 10 units TID with meals, Disp: 15 mL, Rfl: 11 .  Insulin Pen Needle (BD PEN NEEDLE NANO U/F) 32G X 4 MM MISC, USE AS DIRECTED 3 TIMES A DAY. Please schedule an appointment for additional refills., Disp: 100 each, Rfl: 3 .  Lancets MISC, Use as directed.  Accu chek 2, Disp: 100 each, Rfl: 11 .  LANTUS SOLOSTAR 100 UNIT/ML Solostar Pen, INJECT 34 UNITS INTO THE SKIN DAILY., Disp: 15 mL, Rfl: 1 .  latanoprost (XALATAN) 0.005 % ophthalmic solution, Place 1 drop into the right eye at bedtime., Disp: , Rfl:  .   lisinopril (ZESTRIL) 10 MG tablet, , Disp: , Rfl:  .  lisinopril (ZESTRIL) 20 MG tablet, Take 1 tablet (20 mg total) by mouth daily., Disp: 90 tablet, Rfl: 3 .  Multiple Vitamins-Minerals (MULTIVITAMIN WITH MINERALS) tablet, Take 1 tablet by mouth daily., Disp: , Rfl:  .  RHOPRESSA 0.02 % SOLN, Place 1 drop into the right eye at bedtime., Disp: , Rfl:  .  timolol (BETIMOL) 0.5 % ophthalmic solution, Place 1 drop into the right eye 2 (two) times daily., Disp: , Rfl:  .  VYZULTA 0.024 % SOLN, Place 1 drop into the right eye at bedtime., Disp: , Rfl:  No Known Allergies Social History   Tobacco Use  Smoking Status Never Smoker  Smokeless Tobacco Never Used   Objective:  There were no vitals filed for this visit. Constitutional Patient is a pleasant 64 y.o. Caucasian male obese in NAD.Marland Kitchen AAO x 3.  Vascular Neurovascular status unchanged b/l lower extremities. Capillary fill time to digits <3 seconds b/l lower extremities. Palpable pedal pulses b/l LE. Pedal hair absent. Lower extremity skin temperature gradient within normal limits. Evidence of chronic venous insufficiency b/l LE. No cyanosis or clubbing noted.  Neurologic Normal speech. Oriented to person, place, and time. Protective sensation diminished with 10g monofilament b/l. Vibratory sensation diminished b/l. Proprioception intact bilaterally.  Dermatologic Pedal skin with normal turgor, texture and tone bilaterally. No open wounds bilaterally. No interdigital macerations bilaterally. Toenails 1-5 b/l elongated, discolored, dystrophic, thickened, crumbly with subungual debris and tenderness to dorsal palpation. Hyperkeratotic lesion(s) submet head 5 right foot.  No erythema, no edema, no drainage, no flocculence.  Orthopedic: Normal muscle strength 5/5 to all lower extremity muscle groups bilaterally. No pain crepitus or joint limitation noted with ROM b/l. Hammertoes noted to the 2-5 bilaterally.   Hemoglobin A1C Latest Ref Rng & Units  10/17/2019 07/12/2019  HGBA1C 4.0 - 5.6 % 11.8(A) 12.7(A)  Some recent data might be hidden   Assessment:   1. Pain due to onychomycosis of toenails of both feet   2. Callus   3. Diabetic peripheral neuropathy associated with type 2 diabetes mellitus ([place])    Plan:  Patient was evaluated and treated and all questions answered.  Onychomycosis with pain -Nails palliatively debridement as below. -Educated on self-care  Procedure: Nail Debridement Rationale: Pain Type of Debridement: manual, sharp debridement. Instrumentation: Nail nipper, rotary burr. Number of Nails: 10  -Examined patient. -Toenails 1-5 b/l were debrided in length and girth with sterile nail nippers and dremel without iatrogenic bleeding.  -Callus(es) submet head 5 right foot pared utilizing sterile scalpel blade without complication or incident. Total number debrided =1. -Patient to report any pedal injuries to medical professional immediately. -Patient to continue soft, supportive shoe gear daily. -Patient/POA to call should there be question/concern in the interim.  Return in about 3 months (around 06/19/2020) for diabetic nail and callus trim.  Marzetta Board, DPM

## 2020-03-19 NOTE — Patient Instructions (Signed)
Moisturize feet once daily; do not apply between toes: If you have problems reaching your feet:  A.  Aquaphor Advanced Therapy Ointment Body Spray B.  Vaseline Intensive Care Spray Lotion Advanced Repair

## 2020-04-10 DIAGNOSIS — H4052X3 Glaucoma secondary to other eye disorders, left eye, severe stage: Secondary | ICD-10-CM | POA: Diagnosis not present

## 2020-04-10 DIAGNOSIS — H4089 Other specified glaucoma: Secondary | ICD-10-CM | POA: Diagnosis not present

## 2020-04-10 DIAGNOSIS — Z961 Presence of intraocular lens: Secondary | ICD-10-CM | POA: Diagnosis not present

## 2020-04-10 DIAGNOSIS — Z794 Long term (current) use of insulin: Secondary | ICD-10-CM | POA: Diagnosis not present

## 2020-04-10 DIAGNOSIS — E113553 Type 2 diabetes mellitus with stable proliferative diabetic retinopathy, bilateral: Secondary | ICD-10-CM | POA: Diagnosis not present

## 2020-04-11 ENCOUNTER — Other Ambulatory Visit: Payer: Self-pay

## 2020-04-11 ENCOUNTER — Encounter (INDEPENDENT_AMBULATORY_CARE_PROVIDER_SITE_OTHER): Payer: Self-pay | Admitting: Ophthalmology

## 2020-04-11 ENCOUNTER — Ambulatory Visit (INDEPENDENT_AMBULATORY_CARE_PROVIDER_SITE_OTHER): Payer: Medicare Other | Admitting: Ophthalmology

## 2020-04-11 DIAGNOSIS — E113511 Type 2 diabetes mellitus with proliferative diabetic retinopathy with macular edema, right eye: Secondary | ICD-10-CM

## 2020-04-11 DIAGNOSIS — H211X1 Other vascular disorders of iris and ciliary body, right eye: Secondary | ICD-10-CM

## 2020-04-11 MED ORDER — BEVACIZUMAB CHEMO INJECTION 1.25MG/0.05ML SYRINGE FOR KALEIDOSCOPE
1.2500 mg | INTRAVITREAL | Status: AC | PRN
  Administered 2020-04-11: 1.25 mg via INTRAVITREAL

## 2020-04-11 NOTE — Patient Instructions (Signed)
Patient to notify the office promptly if difficulties in acuity or profound decline in develops in the right eye

## 2020-04-11 NOTE — Assessment & Plan Note (Signed)
Chronic OD, with history of recurrences and worsening when antivegF therapy halted or paused

## 2020-04-11 NOTE — Assessment & Plan Note (Signed)
PDR, and CSME controlled with every 7-week injection.  In addition anterior segment neovascularization while stable is not progressing, with excellent visual acuity, repeat intravitreal Avastin OD today to maintain

## 2020-04-11 NOTE — Progress Notes (Signed)
04/11/2020     CHIEF COMPLAINT Patient presents for Retina Follow Up   HISTORY OF PRESENT ILLNESS: Derek Lowery is a 64 y.o. male who presents to the clinic today for:   HPI    Retina Follow Up    Patient presents with  Diabetic Retinopathy.  In right eye.  This started 7 weeks ago.  Severity is mild.  Duration of 7 weeks.  Since onset it is stable.          Comments    7 Week Diabetic F/U OD, poss Avastin OD  Pt denies noticeable changes to [place] OU since last visit. Pt denies ocular pain, flashes of light, or floaters OU.  LBS: pt does not recall, "it's been so long. It was over 200."       Last edited by Rockie Neighbours, [place] on 04/11/2020  9:34 AM. (History)      Referring physician: Ladell Pier, MD [place],  [place] [id number]  HISTORICAL INFORMATION:   Selected notes from the MEDICAL RECORD NUMBER    Lab Results  Component Value Date   HGBA1C 11.8 (A) 10/17/2019     CURRENT MEDICATIONS: Current Outpatient Medications (Ophthalmic Drugs)  Medication Sig  . brimonidine (ALPHAGAN) 0.2 % ophthalmic solution INSTILL 1 DROP INTO RIGHT EYE TWICE A DAY  . dorzolamide (TRUSOPT) 2 % ophthalmic solution Place 1 drop into the right eye 2 (two) times daily.  Marland Kitchen latanoprost (XALATAN) 0.005 % ophthalmic solution Place 1 drop into the right eye at bedtime.  . RHOPRESSA 0.02 % SOLN Place 1 drop into the right eye at bedtime.  . timolol (BETIMOL) 0.5 % ophthalmic solution Place 1 drop into the right eye 2 (two) times daily.  Marland Kitchen VYZULTA 0.024 % SOLN Place 1 drop into the right eye at bedtime.   No current facility-administered medications for this visit. (Ophthalmic Drugs)   Current Outpatient Medications (Other)  Medication Sig  . ammonium lactate (AMLACTIN) 12 % lotion Apply to both feet twice daily for dry skin.  Marland Kitchen aspirin EC 81 MG tablet Take 1 tablet (81 mg total) by mouth daily.  Marland Kitchen atorvastatin (LIPITOR) 40 MG tablet TAKE 1 TABLET BY MOUTH EVERY DAY    . blood glucose meter kit and supplies KIT Dispense based on patient and insurance preference. Use up to four times daily as directed. One Touch Verio  . Continuous Blood Gluc Sensor (FREESTYLE LIBRE SENSOR SYSTEM) MISC Change sensor Q 2 wks  . furosemide (LASIX) 20 MG tablet TAKE 1 TABLET (20 MG TOTAL) BY MOUTH DAILY. STOP LISINOPRIL/HCTZ  . glucose blood (ACCU-CHEK AVIVA PLUS) test strip Use as instructed for 3 times daily testing of blood sugar. E11.9  . glucose monitoring kit (FREESTYLE) monitoring kit 1 each by Does not apply route as needed for other.  . hydrochlorothiazide (HYDRODIURIL) 12.5 MG tablet TAKE 1 TABLET BY MOUTH EVERY DAY  . insulin lispro (HUMALOG KWIKPEN) 100 UNIT/ML KwikPen 10 units TID with meals  . Insulin Pen Needle (BD PEN NEEDLE NANO U/F) 32G X 4 MM MISC USE AS DIRECTED 3 TIMES A DAY. Please schedule an appointment for additional refills.  . Lancets MISC Use as directed.  Accu chek 2  . LANTUS SOLOSTAR 100 UNIT/ML Solostar Pen INJECT 34 UNITS INTO THE SKIN DAILY.  Marland Kitchen lisinopril (ZESTRIL) 10 MG tablet   . lisinopril (ZESTRIL) 20 MG tablet Take 1 tablet (20 mg total) by mouth daily.  . Multiple Vitamins-Minerals (MULTIVITAMIN WITH MINERALS) tablet  Take 1 tablet by mouth daily.   No current facility-administered medications for this visit. (Other)      REVIEW OF SYSTEMS:    ALLERGIES No Known Allergies  PAST MEDICAL HISTORY Past Medical History:  Diagnosis Date  . Arthritis   . CAD (coronary artery disease)    NSTEMI 06/2011:  LHC 07/18/11: Proximal diagonal 60%, distal LAD with a diabetic appearance and 60% stenosis, OM2 with an occluded superior branch and an inferior branch with 90%, EF 55% with inferior hypokinesis.  PCI: Promus DES to the OM2 inferior branch.  This vessel provides collaterals to the superior branch which remained occluded.  Echocardiogram 07/18/11: EF 60%, normal wall motion.  . Cataract   . Essential hypertension, benign   . Glaucoma    . Hypercholesteremia   . Internal hemorrhoids   . Noncompliance   . Type 2 diabetes mellitus ([place]) 1990   Past Surgical History:  Procedure Laterality Date  . CATARACT EXTRACTION Right 2019   Dr. Katy Fitch  . CHOLECYSTECTOMY N/A 09/21/2017   Procedure: LAPAROSCOPIC CHOLECYSTECTOMY WITH INTRAOPERATIVE CHOLANGIOGRAM;  Surgeon: Michael Boston, MD;  Location: [facility];  Service: General;  Laterality: N/A;  . COLONOSCOPY  2000   Dr. Collene Mares  . EYE SURGERY    . IRRIGATION AND DEBRIDEMENT ABSCESS Left 05/05/2014   Procedure: IRRIGATION AND DEBRIDEMENT ABSCESS left buttock;  Surgeon: Michael Boston, MD;  Location: [facility];  Service: General;  Laterality: Left;  . LEFT HEART CATHETERIZATION WITH CORONARY ANGIOGRAM N/A 07/18/2011   Procedure: LEFT HEART CATHETERIZATION WITH CORONARY ANGIOGRAM;  Surgeon: Hillary Bow, MD;  Location: [facility] CATH LAB;  Service: Cardiovascular;  Laterality: N/A;  . PERCUTANEOUS CORONARY STENT INTERVENTION (PCI-S)  07/18/2011   Procedure: PERCUTANEOUS CORONARY STENT INTERVENTION (PCI-S);  Surgeon: Hillary Bow, MD;  Location: [facility] CATH LAB;  Service: Cardiovascular;;    FAMILY HISTORY Family History  Problem Relation Age of Onset  . Coronary artery disease Father        Developed in his 12s  . Kidney disease Father   . Diabetes Father   . Melanoma Father   . Rectal cancer Father   . Heart failure Mother   . Hypertension Mother   . Diabetes Sister   . Diabetes Brother   . Colon cancer Neg Hx   . Esophageal cancer Neg Hx   . Stomach cancer Neg Hx     SOCIAL HISTORY Social History   Tobacco Use  . Smoking status: Never Smoker  . Smokeless tobacco: Never Used  Vaping Use  . Vaping Use: Never used  Substance Use Topics  . Alcohol use: No  . Drug use: No         OPHTHALMIC EXAM:  Base Eye Exam    Visual Acuity (ETDRS)      Right Left   Dist [place] 20/40 NLP   Dist ph [place] 20/30 -2        Tonometry (Tonopen, 9:35 AM)      Right Left   Pressure 13 31        Pupils      Dark Light Shape React APD   Right 3 2 Irregular Brisk None   Left 3 2 Round Brisk +1       Visual Fields (Counting fingers)      Left Right   Restrictions Total superior temporal, inferior temporal, superior nasal, inferior nasal deficiencies Total superior temporal, superior nasal, inferior nasal deficiencies       Extraocular Movement  Right Left    Full Full       Neuro/Psych    Oriented x3: Yes   Mood/Affect: Normal       Dilation    Right eye: 1.0% Mydriacyl, 2.5% Phenylephrine @ 9:37 AM        Slit Lamp and Fundus Exam    External Exam      Right Left   External Normal Normal       Slit Lamp Exam      Right Left   Lids/Lashes Normal Normal   Conjunctiva/Sclera White and quiet White and quiet   Cornea Clear Clear   Anterior Chamber Deep and quiet Deep   Iris Old NVI large trunks, no signs of new  NVI Old neovascularization of the iris 360 degrees, stable nonprogressive   Lens Posterior chamber intraocular lens 3+ Nuclear sclerosis   Anterior Vitreous Normal        Fundus Exam      Right Left   Posterior Vitreous Posterior vitreous detachment No view   Disc Normal Normal   C/D Ratio 0.6    Macula Microaneurysms, no macular thickening, no clinically significant macular edema    Vessels Proliferative diabetic retinopathy, quiet    Periphery  good panretinal photocoagulation, room posteriorly from arcade to the mid periphery for additional PRP, and hopefully this will decrease vegF load           IMAGING AND PROCEDURES  Imaging and Procedures for 04/11/20  OCT, Retina - OU - Both Eyes       Right Eye Quality was good. Scan locations included subfoveal. Central Foveal Thickness: 281. Progression has been stable. Findings include outer retinal atrophy.   Left Eye Quality was good. Scan locations included subfoveal.   Notes No active maculopathy seen on OCT today       Intravitreal Injection, Pharmacologic Agent - OD - Right  Eye       Time Out 04/11/2020. 10:42 AM. Confirmed correct patient, procedure, site, and patient consented.   Anesthesia Topical anesthesia was used. Anesthetic medications included [place] 3.5%.   Procedure Preparation included Tobramycin 0.3%, Ofloxacin , 10% betadine to eyelids, 5% betadine to ocular surface. A supplied needle was used.   Injection:  1.25 mg Bevacizumab (AVASTIN) SOLN   NDC: [address]-3, Lot: [id number]   Route: Intravitreal, Site: Right Eye, Waste: 0 mg  Post-op Post injection exam found visual acuity of at least counting fingers. The patient tolerated the procedure well. There were no complications. The patient received written and verbal post procedure care education. Post injection medications were not given.                 ASSESSMENT/PLAN:  Proliferative diabetic retinopathy of right eye with macular edema associated with type 2 diabetes mellitus (HCC) PDR, and CSME controlled with every 7-week injection.  In addition anterior segment neovascularization while stable is not progressing, with excellent visual acuity, repeat intravitreal Avastin OD today to maintain  Rubeosis iridis of right eye Chronic OD, with history of recurrences and worsening when antivegF therapy halted or paused      ICD-10-CM   1. Proliferative diabetic retinopathy of right eye with macular edema associated with type 2 diabetes mellitus (HCC)  E11.3511 OCT, Retina - OU - Both Eyes    Intravitreal Injection, Pharmacologic Agent - OD - Right Eye    Bevacizumab (AVASTIN) SOLN 1.25 mg  2. Rubeosis iridis of right eye  H21.1X1     1.  Pete intravitreal Avastin  OD today to prevent progression of anterior segment neovascularization and PDR posteriorly  2.  3.  Ophthalmic Meds Ordered this visit:  Meds ordered this encounter  Medications  . Bevacizumab (AVASTIN) SOLN 1.25 mg       Return in about 7 weeks (around 05/30/2020) for dilate, OD, AVASTIN OCT.  Patient  Instructions  Patient to notify the office promptly if difficulties in acuity or profound decline in develops in the right eye    Explained the diagnoses, plan, and follow up with the patient and they expressed understanding.  Patient expressed understanding of the importance of proper follow up care.   Clent Demark Dian Laprade M.D. Diseases & Surgery of the Retina and Vitreous [facility]ina [facility] Diabetic [place] 04/11/20     Abbreviations: M myopia (nearsighted); A astigmatism; H hyperopia (farsighted); P presbyopia; Mrx spectacle prescription;  CTL contact lenses; OD right eye; OS left eye; OU both eyes  XT exotropia; ET esotropia; PEK punctate epithelial keratitis; PEE punctate epithelial erosions; DES dry eye syndrome; MGD meibomian gland dysfunction; ATs artificial tears; PFAT's preservative free artificial tears; [place] nuclear sclerotic cataract; PSC posterior subcapsular cataract; ERM epi-retinal membrane; PVD posterior vitreous detachment; RD retinal detachment; DM diabetes mellitus; DR diabetic retinopathy; NPDR non-proliferative diabetic retinopathy; PDR proliferative diabetic retinopathy; CSME clinically significant macular edema; DME diabetic macular edema; dbh dot blot hemorrhages; CWS cotton wool spot; POAG primary open angle glaucoma; C/D cup-to-disc ratio; HVF humphrey visual field; GVF goldmann visual field; OCT optical coherence tomography; IOP intraocular pressure; BRVO Branch retinal vein occlusion; CRVO central retinal vein occlusion; CRAO central retinal artery occlusion; BRAO branch retinal artery occlusion; RT retinal tear; SB scleral buckle; PPV pars plana vitrectomy; VH Vitreous hemorrhage; PRP panretinal laser photocoagulation; IVK intravitreal kenalog; VMT vitreomacular traction; MH Macular hole;  NVD neovascularization of the disc; NVE neovascularization elsewhere; AREDS age related eye disease study; ARMD age related macular degeneration; POAG primary open angle glaucoma; EBMD  epithelial/anterior basement membrane dystrophy; ACIOL anterior chamber intraocular lens; IOL intraocular lens; PCIOL posterior chamber intraocular lens; Phaco/IOL phacoemulsification with intraocular lens placement; [place] photorefractive keratectomy; LASIK laser assisted in situ keratomileusis; HTN hypertension; DM diabetes mellitus; COPD chronic obstructive pulmonary disease

## 2020-05-23 ENCOUNTER — Ambulatory Visit (INDEPENDENT_AMBULATORY_CARE_PROVIDER_SITE_OTHER): Payer: Medicare Other | Admitting: Ophthalmology

## 2020-05-23 ENCOUNTER — Encounter (INDEPENDENT_AMBULATORY_CARE_PROVIDER_SITE_OTHER): Payer: Self-pay | Admitting: Ophthalmology

## 2020-05-23 ENCOUNTER — Other Ambulatory Visit: Payer: Self-pay

## 2020-05-23 DIAGNOSIS — H211X1 Other vascular disorders of iris and ciliary body, right eye: Secondary | ICD-10-CM | POA: Diagnosis not present

## 2020-05-23 DIAGNOSIS — E113511 Type 2 diabetes mellitus with proliferative diabetic retinopathy with macular edema, right eye: Secondary | ICD-10-CM

## 2020-05-23 MED ORDER — BEVACIZUMAB CHEMO INJECTION 1.25MG/0.05ML SYRINGE FOR KALEIDOSCOPE
1.2500 mg | INTRAVITREAL | Status: AC | PRN
  Administered 2020-05-23: 1.25 mg via INTRAVITREAL

## 2020-05-23 NOTE — Assessment & Plan Note (Signed)
CSME OD controlled at 6-week interval, the total global ocular ischemia with PDR is best controlled also on intravitreal Avastin.  Iris neovascularization controlled with routine delivery of intravitreal Avastin.  We will repeat today

## 2020-05-23 NOTE — Progress Notes (Signed)
05/23/2020     CHIEF COMPLAINT Patient presents for Retina Follow Up   HISTORY OF PRESENT ILLNESS: Derek Lowery is a 64 y.o. male who presents to the clinic today for:   HPI    Retina Follow Up    Patient presents with  Diabetic Retinopathy.  In right eye.  This started 6 weeks ago.  Severity is mild.  Duration of 6 weeks.  Since onset it is stable.          Comments    6 Week Diabetic F/U OD, poss Avastin OD  Pt denies noticeable changes to [place] OU since last visit. Pt denies ocular pain, flashes of light, or floaters OU.  LBS: "200 something" checked "months ago"       Last edited by Rockie Neighbours, [place] on 05/23/2020  9:47 AM. (History)      Referring physician: Ladell Pier, MD [place],  [place] [address]  HISTORICAL INFORMATION:   Selected notes from the MEDICAL RECORD NUMBER    Lab Results  Component Value Date   HGBA1C 11.8 (A) 10/17/2019     CURRENT MEDICATIONS: Current Outpatient Medications (Ophthalmic Drugs)  Medication Sig  . brimonidine (ALPHAGAN) 0.2 % ophthalmic solution INSTILL 1 DROP INTO RIGHT EYE TWICE A DAY  . dorzolamide (TRUSOPT) 2 % ophthalmic solution Place 1 drop into the right eye 2 (two) times daily.  Marland Kitchen latanoprost (XALATAN) 0.005 % ophthalmic solution Place 1 drop into the right eye at bedtime.  . RHOPRESSA 0.02 % SOLN Place 1 drop into the right eye at bedtime.  . timolol (BETIMOL) 0.5 % ophthalmic solution Place 1 drop into the right eye 2 (two) times daily.  Marland Kitchen VYZULTA 0.024 % SOLN Place 1 drop into the right eye at bedtime.   No current facility-administered medications for this visit. (Ophthalmic Drugs)   Current Outpatient Medications (Other)  Medication Sig  . ammonium lactate (AMLACTIN) 12 % lotion Apply to both feet twice daily for dry skin.  Marland Kitchen aspirin EC 81 MG tablet Take 1 tablet (81 mg total) by mouth daily.  Marland Kitchen atorvastatin (LIPITOR) 40 MG tablet TAKE 1 TABLET BY MOUTH EVERY DAY  . blood glucose  meter kit and supplies KIT Dispense based on patient and insurance preference. Use up to four times daily as directed. One Touch Verio  . Continuous Blood Gluc Sensor (FREESTYLE LIBRE SENSOR SYSTEM) MISC Change sensor Q 2 wks  . furosemide (LASIX) 20 MG tablet TAKE 1 TABLET (20 MG TOTAL) BY MOUTH DAILY. STOP LISINOPRIL/HCTZ  . glucose blood (ACCU-CHEK AVIVA PLUS) test strip Use as instructed for 3 times daily testing of blood sugar. E11.9  . glucose monitoring kit (FREESTYLE) monitoring kit 1 each by Does not apply route as needed for other.  . hydrochlorothiazide (HYDRODIURIL) 12.5 MG tablet TAKE 1 TABLET BY MOUTH EVERY DAY  . insulin lispro (HUMALOG KWIKPEN) 100 UNIT/ML KwikPen 10 units TID with meals  . Insulin Pen Needle (BD PEN NEEDLE NANO U/F) 32G X 4 MM MISC USE AS DIRECTED 3 TIMES A DAY. Please schedule an appointment for additional refills.  . Lancets MISC Use as directed.  Accu chek 2  . LANTUS SOLOSTAR 100 UNIT/ML Solostar Pen INJECT 34 UNITS INTO THE SKIN DAILY.  Marland Kitchen lisinopril (ZESTRIL) 10 MG tablet   . lisinopril (ZESTRIL) 20 MG tablet Take 1 tablet (20 mg total) by mouth daily.  . Multiple Vitamins-Minerals (MULTIVITAMIN WITH MINERALS) tablet Take 1 tablet by mouth daily.  No current facility-administered medications for this visit. (Other)      REVIEW OF SYSTEMS:    ALLERGIES No Known Allergies  PAST MEDICAL HISTORY Past Medical History:  Diagnosis Date  . Arthritis   . CAD (coronary artery disease)    NSTEMI 06/2011:  LHC 07/18/11: Proximal diagonal 60%, distal LAD with a diabetic appearance and 60% stenosis, OM2 with an occluded superior branch and an inferior branch with 90%, EF 55% with inferior hypokinesis.  PCI: Promus DES to the OM2 inferior branch.  This vessel provides collaterals to the superior branch which remained occluded.  Echocardiogram 07/18/11: EF 60%, normal wall motion.  . Cataract   . Essential hypertension, benign   . Glaucoma   .  Hypercholesteremia   . Internal hemorrhoids   . Noncompliance   . Type 2 diabetes mellitus ([place]) 1990   Past Surgical History:  Procedure Laterality Date  . CATARACT EXTRACTION Right 2019   Dr. Katy Fitch  . CHOLECYSTECTOMY N/A 09/21/2017   Procedure: LAPAROSCOPIC CHOLECYSTECTOMY WITH INTRAOPERATIVE CHOLANGIOGRAM;  Surgeon: Michael Boston, MD;  Location: [facility];  Service: General;  Laterality: N/A;  . COLONOSCOPY  2000   Dr. Collene Mares  . EYE SURGERY    . IRRIGATION AND DEBRIDEMENT ABSCESS Left 05/05/2014   Procedure: IRRIGATION AND DEBRIDEMENT ABSCESS left buttock;  Surgeon: Michael Boston, MD;  Location: [facility];  Service: General;  Laterality: Left;  . LEFT HEART CATHETERIZATION WITH CORONARY ANGIOGRAM N/A 07/18/2011   Procedure: LEFT HEART CATHETERIZATION WITH CORONARY ANGIOGRAM;  Surgeon: Hillary Bow, MD;  Location: [facility] CATH LAB;  Service: Cardiovascular;  Laterality: N/A;  . PERCUTANEOUS CORONARY STENT INTERVENTION (PCI-S)  07/18/2011   Procedure: PERCUTANEOUS CORONARY STENT INTERVENTION (PCI-S);  Surgeon: Hillary Bow, MD;  Location: [facility] LAB;  Service: Cardiovascular;;    FAMILY HISTORY Family History  Problem Relation Age of Onset  . Coronary artery disease Father        Developed in his 9s  . Kidney disease Father   . Diabetes Father   . Melanoma Father   . Rectal cancer Father   . Heart failure Mother   . Hypertension Mother   . Diabetes Sister   . Diabetes Brother   . Colon cancer Neg Hx   . Esophageal cancer Neg Hx   . Stomach cancer Neg Hx     SOCIAL HISTORY Social History   Tobacco Use  . Smoking status: Never Smoker  . Smokeless tobacco: Never Used  Vaping Use  . Vaping Use: Never used  Substance Use Topics  . Alcohol use: No  . Drug use: No         OPHTHALMIC EXAM:  Base Eye Exam    Visual Acuity (ETDRS)      Right Left   Dist [place] 20/25 NLP       Tonometry (Tonopen, 9:49 AM)      Right Left   Pressure 15 38       Pupils      Dark Light  Shape React APD   Right 3 3 Irregular Minimal None   Left 3 3 Round Minimal +1       Visual Fields (Counting fingers)      Left Right   Restrictions Total superior temporal, inferior temporal, superior nasal, inferior nasal deficiencies Total superior temporal, superior nasal, inferior nasal deficiencies       Extraocular Movement      Right Left    Full Full       Neuro/Psych  Oriented x3: Yes   Mood/Affect: Normal       Dilation    Right eye: 1.0% Mydriacyl, 2.5% Phenylephrine @ 9:51 AM        Slit Lamp and Fundus Exam    External Exam      Right Left   External Normal Normal       Slit Lamp Exam      Right Left   Lids/Lashes Normal Normal   Conjunctiva/Sclera White and quiet White and quiet   Cornea Clear Clear   Anterior Chamber Deep and quiet Deep   Iris Old NVI large trunks, no signs of new  NVI Old neovascularization of the iris 360 degrees, stable nonprogressive   Lens Posterior chamber intraocular lens 3+ Nuclear sclerosis   Anterior Vitreous Normal        Fundus Exam      Right Left   Posterior Vitreous Posterior vitreous detachment No view   Disc Normal Normal   C/D Ratio 0.6    Macula Microaneurysms, no macular thickening, no clinically significant macular edema    Vessels Proliferative diabetic retinopathy, quiet    Periphery  good panretinal photocoagulation, room posteriorly from arcade to the mid periphery for additional PRP, and hopefully this will decrease vegF load           IMAGING AND PROCEDURES  Imaging and Procedures for 05/23/20  OCT, Retina - OU - Both Eyes       Right Eye Quality was good. Scan locations included subfoveal. Central Foveal Thickness: 280. Progression has improved.   Notes OD with much less CSME, stable at 6-week interval post Avastin.  We will repeat injection today and examination in 6 to 7 weeks       Intravitreal Injection, Pharmacologic Agent - OD - Right Eye       Time Out 05/23/2020. 11:23 AM.  Confirmed correct patient, procedure, site, and patient consented.   Anesthesia Topical anesthesia was used. Anesthetic medications included [place] 3.5%.   Procedure Preparation included Tobramycin 0.3%, 10% betadine to eyelids, 5% betadine to ocular surface. A 30 gauge needle was used.   Injection:  1.25 mg Bevacizumab (AVASTIN) SOLN   NDC: 70360-001-02, Lot: [id number]   Route: Intravitreal, Site: Right Eye, Waste: 0 mg  Post-op Post injection exam found visual acuity of at least counting fingers. The patient tolerated the procedure well. There were no complications. The patient received written and verbal post procedure care education. Post injection medications were not given.                 ASSESSMENT/PLAN:  Proliferative diabetic retinopathy of right eye with macular edema associated with type 2 diabetes mellitus (HCC) CSME OD controlled at 6-week interval, the total global ocular ischemia with PDR is best controlled also on intravitreal Avastin.  Iris neovascularization controlled with routine delivery of intravitreal Avastin.  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 Proliferative diabetic retinopathy of right eye with macular edema associated with type 2 diabetes mellitus (HCC)  E11.3511 OCT, Retina - OU - Both Eyes    Intravitreal Injection, Pharmacologic Agent - OD - Right Eye    Bevacizumab (AVASTIN) SOLN 1.25 mg  2. Rubeosis iridis of right eye  H21.1X1     1.  Repeat intravitreal Avastin OD today and examination in 6 to 7 weeks to maintain  2.  3.  Ophthalmic Meds Ordered this visit:  Meds  ordered this encounter  Medications  . Bevacizumab (AVASTIN) SOLN 1.25 mg       Return in about 7 weeks (around 07/11/2020) for dilate, OD, AVASTIN OCT.  There are no Patient Instructions on file for this visit.   Explained the diagnoses, plan, and follow up with the patient and they expressed  understanding.  Patient expressed understanding of the importance of proper follow up care.   Clent Demark Lyrique Hakim M.D. Diseases & Surgery of the Retina and Vitreous Retina & Diabetic [place] 05/23/20     Abbreviations: M myopia (nearsighted); A astigmatism; H hyperopia (farsighted); P presbyopia; Mrx spectacle prescription;  CTL contact lenses; OD right eye; OS left eye; OU both eyes  XT exotropia; ET esotropia; PEK punctate epithelial keratitis; PEE punctate epithelial erosions; DES dry eye syndrome; MGD meibomian gland dysfunction; ATs artificial tears; PFAT's preservative free artificial tears; [place] nuclear sclerotic cataract; PSC posterior subcapsular cataract; ERM epi-retinal membrane; PVD posterior vitreous detachment; RD retinal detachment; DM diabetes mellitus; DR diabetic retinopathy; NPDR non-proliferative diabetic retinopathy; PDR proliferative diabetic retinopathy; CSME clinically significant macular edema; DME diabetic macular edema; dbh dot blot hemorrhages; CWS cotton wool spot; POAG primary open angle glaucoma; C/D cup-to-disc ratio; HVF humphrey visual field; GVF goldmann visual field; OCT optical coherence tomography; IOP intraocular pressure; BRVO Branch retinal vein occlusion; CRVO central retinal vein occlusion; CRAO central retinal artery occlusion; BRAO branch retinal artery occlusion; RT retinal tear; SB scleral buckle; PPV pars plana vitrectomy; VH Vitreous hemorrhage; PRP panretinal laser photocoagulation; IVK intravitreal kenalog; VMT vitreomacular traction; MH Macular hole;  NVD neovascularization of the disc; NVE neovascularization elsewhere; AREDS age related eye disease study; ARMD age related macular degeneration; POAG primary open angle glaucoma; EBMD epithelial/anterior basement membrane dystrophy; ACIOL anterior chamber intraocular lens; IOL intraocular lens; PCIOL posterior chamber intraocular lens; Phaco/IOL phacoemulsification with intraocular lens placement; Guy  photorefractive keratectomy; LASIK laser assisted in situ keratomileusis; HTN hypertension; DM diabetes mellitus; COPD chronic obstructive pulmonary disease

## 2020-05-23 NOTE — Assessment & Plan Note (Signed)
Old, chronic, stable today

## 2020-06-14 ENCOUNTER — Other Ambulatory Visit: Payer: Self-pay | Admitting: Internal Medicine

## 2020-06-14 DIAGNOSIS — Z794 Long term (current) use of insulin: Secondary | ICD-10-CM

## 2020-06-14 DIAGNOSIS — E1142 Type 2 diabetes mellitus with diabetic polyneuropathy: Secondary | ICD-10-CM

## 2020-06-14 NOTE — Telephone Encounter (Signed)
Requested medication (s) are due for refill today: yes  Requested medication (s) are on the active medication list: yes   Last refill:  04/26/2020  Future visit scheduled: no  Notes to clinic: overdue for labs and follow up appointment  Vm left for appointment    Requested Prescriptions  Pending Prescriptions Disp Refills   LANTUS SOLOSTAR 100 UNIT/ML Solostar Pen [Pharmacy Med Name: LANTUS SOLOSTAR 100 UNIT/ML] 15 mL 1    Sig: INJECT 34 UNITS INTO THE SKIN DAILY.      Endocrinology:  Diabetes - Insulins Failed - 06/14/2020  3:16 PM      Failed - HBA1C is between 0 and 7.9 and within 180 days    Hemoglobin A1C  Date Value Ref Range Status  10/17/2019 11.8 (A) 4.0 - 5.6 % Final   HbA1c, POC (controlled diabetic range)  Date Value Ref Range Status  09/24/2018 8.2 (A) 0.0 - 7.0 % Final          Failed - Valid encounter within last 6 months    Recent Outpatient Visits           8 months ago Type 2 diabetes mellitus with diabetic polyneuropathy, with long-term current use of insulin ([place])   [place] Karle Plumber B, MD   11 months ago DM type 2 with diabetic peripheral neuropathy [facility])   [place], [place], [place]   1 year ago DM type 2 with diabetic peripheral neuropathy [facility])   [facility] [facility] Karle Plumber B, MD   1 year ago Uncontrolled type 2 diabetes mellitus with peripheral neuropathy [facility])   [facility] Karle Plumber B, MD   1 year ago Uncontrolled type 2 diabetes mellitus with complication, with long-term current use of insulin [facility])   [place] [facility] Ladell Pier, MD

## 2020-06-20 ENCOUNTER — Emergency Department (HOSPITAL_COMMUNITY): Payer: Medicare Other

## 2020-06-20 ENCOUNTER — Other Ambulatory Visit: Payer: Self-pay

## 2020-06-20 ENCOUNTER — Emergency Department (HOSPITAL_COMMUNITY)
Admission: EM | Admit: 2020-06-20 | Discharge: 2020-06-21 | Disposition: A | Discharge status: Home / Self Care | Payer: Medicare Other | Attending: Emergency Medicine | Admitting: Emergency Medicine

## 2020-06-20 ENCOUNTER — Encounter (HOSPITAL_COMMUNITY): Payer: Self-pay | Admitting: Emergency Medicine

## 2020-06-20 DIAGNOSIS — I10 Essential (primary) hypertension: Secondary | ICD-10-CM | POA: Diagnosis not present

## 2020-06-20 DIAGNOSIS — Z951 Presence of aortocoronary bypass graft: Secondary | ICD-10-CM | POA: Diagnosis not present

## 2020-06-20 DIAGNOSIS — I2510 Atherosclerotic heart disease of native coronary artery without angina pectoris: Secondary | ICD-10-CM | POA: Diagnosis not present

## 2020-06-20 DIAGNOSIS — R197 Diarrhea, unspecified: Secondary | ICD-10-CM | POA: Diagnosis present

## 2020-06-20 DIAGNOSIS — Z7982 Long term (current) use of aspirin: Secondary | ICD-10-CM | POA: Diagnosis not present

## 2020-06-20 DIAGNOSIS — J1282 Pneumonia due to coronavirus disease 2019: Secondary | ICD-10-CM | POA: Insufficient documentation

## 2020-06-20 DIAGNOSIS — Z79899 Other long term (current) drug therapy: Secondary | ICD-10-CM | POA: Insufficient documentation

## 2020-06-20 DIAGNOSIS — Z23 Encounter for immunization: Secondary | ICD-10-CM | POA: Insufficient documentation

## 2020-06-20 DIAGNOSIS — E11319 Type 2 diabetes mellitus with unspecified diabetic retinopathy without macular edema: Secondary | ICD-10-CM | POA: Diagnosis not present

## 2020-06-20 DIAGNOSIS — D72819 Decreased white blood cell count, unspecified: Secondary | ICD-10-CM | POA: Diagnosis not present

## 2020-06-20 DIAGNOSIS — U071 COVID-19: Secondary | ICD-10-CM | POA: Diagnosis not present

## 2020-06-20 DIAGNOSIS — J129 Viral pneumonia, unspecified: Secondary | ICD-10-CM

## 2020-06-20 DIAGNOSIS — R059 Cough, unspecified: Secondary | ICD-10-CM | POA: Diagnosis not present

## 2020-06-20 DIAGNOSIS — Z20822 Contact with and (suspected) exposure to covid-19: Secondary | ICD-10-CM

## 2020-06-20 DIAGNOSIS — Z794 Long term (current) use of insulin: Secondary | ICD-10-CM | POA: Insufficient documentation

## 2020-06-20 DIAGNOSIS — Z8616 Personal history of COVID-19: Secondary | ICD-10-CM | POA: Diagnosis not present

## 2020-06-20 DIAGNOSIS — E1140 Type 2 diabetes mellitus with diabetic neuropathy, unspecified: Secondary | ICD-10-CM | POA: Diagnosis not present

## 2020-06-20 LAB — RESPIRATORY PANEL BY RT PCR (FLU A&B, COVID)
Influenza A by PCR: NEGATIVE
Influenza B by PCR: NEGATIVE
SARS Coronavirus 2 by RT PCR: POSITIVE — AB

## 2020-06-20 LAB — URINALYSIS, ROUTINE W REFLEX MICROSCOPIC
Bilirubin Urine: NEGATIVE
Glucose, UA: >=500 mg/dL — AB
Ketones, ur: NEGATIVE mg/dL
Leukocytes,Ua: NEGATIVE
Nitrite: NEGATIVE
Protein, ur: >=300 mg/dL — AB
Specific Gravity, Urine: 1.021 (ref 1.005–1.030)
pH: 5 (ref 5.0–8.0)

## 2020-06-20 LAB — COMPREHENSIVE METABOLIC PANEL
ALT: 22 U/L (ref 0–44)
AST: 39 U/L (ref 15–41)
Albumin: 3.1 g/dL — ABNORMAL LOW (ref 3.5–5.0)
Alkaline Phosphatase: 53 U/L (ref 38–126)
Anion gap: 11 (ref 5–15)
BUN: 29 mg/dL — ABNORMAL HIGH (ref 8–23)
CO2: 26 mmol/L (ref 22–32)
Calcium: 8.5 mg/dL — ABNORMAL LOW (ref 8.9–10.3)
Chloride: 97 mmol/L — ABNORMAL LOW (ref 98–111)
Creatinine, Ser: 1.45 mg/dL — ABNORMAL HIGH (ref 0.61–1.24)
GFR, Estimated: 54 mL/min — ABNORMAL LOW (ref >60)
Glucose, Bld: 263 mg/dL — ABNORMAL HIGH (ref 70–99)
Potassium: 3.9 mmol/L (ref 3.5–5.1)
Sodium: 134 mmol/L — ABNORMAL LOW (ref 135–145)
Total Bilirubin: 0.5 mg/dL (ref 0.3–1.2)
Total Protein: 6.4 g/dL — ABNORMAL LOW (ref 6.5–8.1)

## 2020-06-20 LAB — CBC
HCT: 37.5 % — ABNORMAL LOW (ref 39.0–52.0)
Hemoglobin: 12.4 g/dL — ABNORMAL LOW (ref 13.0–17.0)
MCH: 28.5 pg (ref 26.0–34.0)
MCHC: 33.1 g/dL (ref 30.0–36.0)
MCV: 86.2 fL (ref 80.0–100.0)
Platelets: UNDETERMINED 10*3/uL (ref 150–400)
RBC: 4.35 MIL/uL (ref 4.22–5.81)
RDW: 12.6 % (ref 11.5–15.5)
WBC: 2.4 10*3/uL — ABNORMAL LOW (ref 4.0–10.5)
nRBC: 0 % (ref 0.0–0.2)

## 2020-06-20 LAB — LIPASE, BLOOD: Lipase: 29 U/L (ref 11–51)

## 2020-06-20 MED ORDER — EPINEPHRINE 0.3 MG/0.3ML IJ SOAJ: 0.3000 mg | Freq: Once | INTRAMUSCULAR | Status: DC | PRN

## 2020-06-20 MED ORDER — SODIUM CHLORIDE 0.9 % IV SOLN: INTRAVENOUS | Status: DC | PRN

## 2020-06-20 MED ORDER — SODIUM CHLORIDE 0.9 % IV SOLN
Freq: Once | INTRAVENOUS | Status: AC
  Filled 2020-06-20: qty 20

## 2020-06-20 MED ORDER — DIPHENHYDRAMINE HCL 50 MG/ML IJ SOLN: 50.0000 mg | Freq: Once | INTRAMUSCULAR | Status: DC | PRN

## 2020-06-20 MED ORDER — ALBUTEROL SULFATE HFA 108 (90 BASE) MCG/ACT IN AERS: 2.0000 | INHALATION_SPRAY | Freq: Once | RESPIRATORY_TRACT | Status: DC | PRN

## 2020-06-20 MED ORDER — ONDANSETRON 4 MG PO TBDP: 4.0000 mg | ORAL_TABLET | Freq: Once | ORAL | Status: DC

## 2020-06-20 MED ORDER — METHYLPREDNISOLONE SODIUM SUCC 125 MG IJ SOLR: 125.0000 mg | Freq: Once | INTRAMUSCULAR | Status: DC | PRN

## 2020-06-20 MED ORDER — FAMOTIDINE IN NACL 20-0.9 MG/50ML-% IV SOLN: 20.0000 mg | Freq: Once | INTRAVENOUS | Status: DC | PRN

## 2020-06-20 MED ORDER — SODIUM CHLORIDE 0.9 % IV BOLUS
500.0000 mL | Freq: Once | INTRAVENOUS | Status: AC
  Administered 2020-06-20: 500 mL via INTRAVENOUS

## 2020-06-20 MED ORDER — SODIUM CHLORIDE 0.9 % IV SOLN: 1200.0000 mg | Freq: Once | INTRAVENOUS | Status: DC

## 2020-06-20 MED ORDER — ONDANSETRON HCL 4 MG/2ML IJ SOLN
4.0000 mg | Freq: Once | INTRAMUSCULAR | Status: AC
  Administered 2020-06-20: 4 mg via INTRAVENOUS
  Filled 2020-06-20: qty 2

## 2020-06-20 NOTE — ED Provider Notes (Signed)
[facility] EMERGENCY DEPARTMENT Provider Note   CSN: [id number] Arrival date & time: 06/20/20  1343     History Chief Complaint  Patient presents with  . Diarrhea    Derek Lowery is a 64 y.o. male.  Presents to ER with concern for cough, diarrhea, nausea, headache.  Symptoms started on Friday or Saturday.  Initially just noted the cough, general malaise.  Over the weekend he started having some loose stools, occasional nausea but no vomiting.  No associated abdominal pain.  Feels that every time he drinks water it runs right through him.  Has been able to tolerate fluids.  No fevers, no difficulty in breathing.  Cough is nonproductive.  Reports family member tested positive for COVID-19 recently and had significant exposure.  Has not been vaccinated against Covid.   CAD, diabetes, hypertension.  HPI     Past Medical History:  Diagnosis Date  . Arthritis   . CAD (coronary artery disease)    NSTEMI 06/2011:  LHC 07/18/11: Proximal diagonal 60%, distal LAD with a diabetic appearance and 60% stenosis, OM2 with an occluded superior branch and an inferior branch with 90%, EF 55% with inferior hypokinesis.  PCI: Promus DES to the OM2 inferior branch.  This vessel provides collaterals to the superior branch which remained occluded.  Echocardiogram 07/18/11: EF 60%, normal wall motion.  . Cataract   . Essential hypertension, benign   . Glaucoma   . Hypercholesteremia   . Internal hemorrhoids   . Noncompliance   . Type 2 diabetes mellitus ([place]) 1990    Patient Active Problem List   Diagnosis Date Noted  . Proliferative diabetic retinopathy of right eye with macular edema associated with type 2 diabetes mellitus ([place]) 11/30/2019  . Rubeosis iridis of right eye 11/30/2019  . Diabetic visual loss better eye: severe vision impairment; lesser eye: blind, without macular edema, with proliferative retinopathy, associated with type 2 diabetes mellitus ([place])  11/30/2019  . Glaucoma with pupillary seclusion, unspecified laterality, severe stage 11/30/2019  . Trigger middle finger of right hand 10/17/2019  . Pain due to onychomycosis of toenails of both feet 08/07/2019  . Educated about COVID-19 virus infection 12/02/2018  . RBBB 12/02/2018  . Benign paroxysmal positional vertigo 06/25/2018  . Pre-ulcerative corn or callous 10/13/2017  . Anemia 10/13/2017  . Chronic diarrhea 06/09/2017  . Microalbuminuria 03/07/2017  . Dry skin 03/05/2017  . Glaucoma 03/05/2017  . Legally blind 03/05/2017  . Retinopathy due to secondary diabetes ([place]) 02/06/2016  . Diabetic polyneuropathy associated with type 2 diabetes mellitus ([place]) 01/12/2015  . Fournier's gangrene into true pelvis s/p I&D 05/05/2014 05/05/2014  . Type 2 diabetes mellitus with complication, with long-term current use of insulin ([place]) 07/17/2011  . HLD (hyperlipidemia) 07/17/2011  . Coronary artery disease of native artery of native heart with stable angina pectoris ([place]) 07/17/2011  . Essential hypertension 07/17/2011    Past Surgical History:  Procedure Laterality Date  . CATARACT EXTRACTION Right 2019   Dr. Katy Fitch  . CHOLECYSTECTOMY N/A 09/21/2017   Procedure: LAPAROSCOPIC CHOLECYSTECTOMY WITH INTRAOPERATIVE CHOLANGIOGRAM;  Surgeon: Michael Boston, MD;  Location: [facility] ORS;  Service: General;  Laterality: N/A;  . COLONOSCOPY  2000   Dr. Collene Mares  . EYE SURGERY    . IRRIGATION AND DEBRIDEMENT ABSCESS Left 05/05/2014   Procedure: IRRIGATION AND DEBRIDEMENT ABSCESS left buttock;  Surgeon: Michael Boston, MD;  Location: [facility] ORS;  Service: General;  Laterality: Left;  . LEFT HEART CATHETERIZATION WITH CORONARY ANGIOGRAM N/A  07/18/2011   Procedure: LEFT HEART CATHETERIZATION WITH CORONARY ANGIOGRAM;  Surgeon: Hillary Bow, MD;  Location: [facility] CATH LAB;  Service: Cardiovascular;  Laterality: N/A;  . PERCUTANEOUS CORONARY STENT INTERVENTION (PCI-S)  07/18/2011   Procedure: PERCUTANEOUS CORONARY STENT  INTERVENTION (PCI-S);  Surgeon: Hillary Bow, MD;  Location: [facility] CATH LAB;  Service: Cardiovascular;;       Family History  Problem Relation Age of Onset  . Coronary artery disease Father        Developed in his 2s  . Kidney disease Father   . Diabetes Father   . Melanoma Father   . Rectal cancer Father   . Heart failure Mother   . Hypertension Mother   . Diabetes Sister   . Diabetes Brother   . Colon cancer Neg Hx   . Esophageal cancer Neg Hx   . Stomach cancer Neg Hx     Social History   Tobacco Use  . Smoking status: Never Smoker  . Smokeless tobacco: Never Used  Vaping Use  . Vaping Use: Never used  Substance Use Topics  . Alcohol use: No  . Drug use: No    Home Medications Prior to Admission medications   Medication Sig Start Date End Date Taking? Authorizing Provider  ammonium lactate (AMLACTIN) 12 % lotion Apply to both feet twice daily for dry skin. 10/31/19   Marzetta Board, DPM  aspirin EC 81 MG tablet Take 1 tablet (81 mg total) by mouth daily. 07/12/19   Argentina Donovan, PA-C  atorvastatin (LIPITOR) 40 MG tablet TAKE 1 TABLET BY MOUTH EVERY DAY 03/09/20   Ladell Pier, MD  blood glucose meter kit and supplies KIT Dispense based on patient and insurance preference. Use up to four times daily as directed. One Touch Verio 09/24/18   Ladell Pier, MD  brimonidine (ALPHAGAN) 0.2 % ophthalmic solution INSTILL 1 DROP INTO RIGHT EYE TWICE A DAY 07/23/18   [provider]  Continuous Blood Gluc Sensor (FREESTYLE LIBRE SENSOR SYSTEM) MISC Change sensor Q 2 wks 10/17/19   Ladell Pier, MD  dorzolamide (TRUSOPT) 2 % ophthalmic solution Place 1 drop into the right eye 2 (two) times daily.    [provider]  furosemide (LASIX) 20 MG tablet TAKE 1 TABLET (20 MG TOTAL) BY MOUTH DAILY. STOP LISINOPRIL/HCTZ 01/10/20   Ladell Pier, MD  glucose blood (ACCU-CHEK AVIVA PLUS) test strip Use as instructed for 3 times daily testing of  blood sugar. E11.9 10/20/17   Ladell Pier, MD  glucose monitoring kit (FREESTYLE) monitoring kit 1 each by Does not apply route as needed for other. 06/24/18   Ladell Pier, MD  hydrochlorothiazide (HYDRODIURIL) 12.5 MG tablet TAKE 1 TABLET BY MOUTH EVERY DAY 01/12/20   Ladell Pier, MD  insulin glargine (LANTUS SOLOSTAR) 100 UNIT/ML Solostar Pen INJECT 34 UNITS INTO THE SKIN DAILY. Please make PCP appointment. 06/15/20   Ladell Pier, MD  insulin lispro (HUMALOG KWIKPEN) 100 UNIT/ML KwikPen 10 units TID with meals 10/17/19   Ladell Pier, MD  Insulin Pen Needle (BD PEN NEEDLE NANO U/F) 32G X 4 MM MISC USE AS DIRECTED 3 TIMES A DAY. Please schedule an appointment for additional refills. 07/12/19   Argentina Donovan, PA-C  Lancets MISC Use as directed.  Accu chek 2 12/21/17   Ladell Pier, MD  latanoprost (XALATAN) 0.005 % ophthalmic solution Place 1 drop into the right eye at bedtime. 07/01/19   [provider]  lisinopril (ZESTRIL) 10 MG tablet  10/28/19   [provider]  lisinopril (ZESTRIL) 20 MG tablet Take 1 tablet (20 mg total) by mouth daily. 10/17/19   Ladell Pier, MD  Multiple Vitamins-Minerals (MULTIVITAMIN WITH MINERALS) tablet Take 1 tablet by mouth daily.    [provider]  RHOPRESSA 0.02 % SOLN Place 1 drop into the right eye at bedtime. 01/24/20   [provider]  timolol (BETIMOL) 0.5 % ophthalmic solution Place 1 drop into the right eye 2 (two) times daily.    [provider]  VYZULTA 0.024 % SOLN Place 1 drop into the right eye at bedtime. 09/19/19   [provider]    Allergies    Patient has no known allergies.  Review of Systems   Review of Systems  Constitutional: Positive for fatigue. Negative for chills and fever.  HENT: Negative for ear pain and sore throat.   Eyes: Negative for pain and visual disturbance.  Respiratory: Positive for cough. Negative for shortness of breath.     Cardiovascular: Negative for chest pain and palpitations.  Gastrointestinal: Positive for diarrhea. Negative for abdominal pain and vomiting.  Genitourinary: Negative for dysuria and hematuria.  Musculoskeletal: Negative for arthralgias and back pain.  Skin: Negative for color change and rash.  Neurological: Negative for seizures and syncope.  All other systems reviewed and are negative.   Physical Exam Updated Vital Signs BP (!) 169/92   Pulse 86   Temp 99.5 F (37.5 C)   Resp (!) 22   SpO2 95%   Physical Exam Vitals and nursing note reviewed.  Constitutional:      Appearance: He is well-developed.  HENT:     Head: Normocephalic and atraumatic.  Eyes:     Conjunctiva/sclera: Conjunctivae normal.  Cardiovascular:     Rate and Rhythm: Normal rate and regular rhythm.     Heart sounds: No murmur heard.   Pulmonary:     Effort: Pulmonary effort is normal. No respiratory distress.     Breath sounds: Normal breath sounds.  Abdominal:     Palpations: Abdomen is soft.     Tenderness: There is no abdominal tenderness.  Musculoskeletal:        General: No deformity or signs of injury.     Cervical back: Neck supple. No rigidity.  Skin:    General: Skin is warm and dry.  Neurological:     General: No focal deficit present.     Mental Status: He is alert.  Psychiatric:        Mood and Affect: Mood normal.        Behavior: Behavior normal.     ED Results / Procedures / Treatments   Labs (all labs ordered are listed, but only abnormal results are displayed) Labs Reviewed  COMPREHENSIVE METABOLIC PANEL - Abnormal; Notable for the following components:      Result Value   Sodium 134 (*)    Chloride 97 (*)    Glucose, Bld 263 (*)    BUN 29 (*)    Creatinine, Ser 1.45 (*)    Calcium 8.5 (*)    Total Protein 6.4 (*)    Albumin 3.1 (*)    GFR, Estimated 54 (*)    All other components within normal limits  CBC - Abnormal; Notable for the following components:   WBC 2.4  (*)    Hemoglobin 12.4 (*)    HCT 37.5 (*)    All other components within  normal limits  URINALYSIS, ROUTINE W REFLEX MICROSCOPIC - Abnormal; Notable for the following components:   APPearance HAZY (*)    Glucose, UA >=500 (*)    Hgb urine dipstick SMALL (*)    Protein, ur >=300 (*)    Bacteria, UA RARE (*)    All other components within normal limits  RESPIRATORY PANEL BY RT PCR (FLU A&B, COVID)  LIPASE, BLOOD    EKG None  Radiology DG Chest Portable 1 View  Result Date: 06/20/2020 CLINICAL DATA:  Productive cough EXAM: PORTABLE CHEST 1 VIEW COMPARISON:  Radiograph 09/21/2017 FINDINGS: Heterogeneous patchy and streaky opacities are present in both lungs with a slight basilar and peripheral predominance. No pneumothorax or visible effusion. Atherosclerotic calcification of the coronary arteries and aorta. Cardiomediastinal contours are otherwise unremarkable. No acute osseous or soft tissue abnormality. Degenerative changes are present in the imaged spine and shoulders. IMPRESSION: Heterogeneous patchy and streaky opacities in both lungs with a slight basilar and peripheral predominance. Findings are worrisome for multifocal pneumonia including potential viral etiologies. Electronically Signed   By: Lovena Le M.D.   On: 06/20/2020 19:28    Procedures Procedures (including critical care time)  Medications Ordered in ED Medications  sodium chloride 0.9 % bolus 500 mL (500 mLs Intravenous New Bag/Given 06/20/20 1944)  ondansetron (ZOFRAN) injection 4 mg (4 mg Intravenous Given 06/20/20 1945)    ED Course  I have reviewed the triage vital signs and the nursing notes.  Pertinent labs & imaging results that were available during my care of the patient were reviewed by me and considered in my medical decision making (see chart for details).  Clinical Course as of Jun 21 2103  Wed Jun 20, 2020  1907 Fri/Sat   [RD]  2056 [phone]   [RD]    Clinical Course User Index [RD]  Lucrezia Starch, MD   MDM Rules/Calculators/A&P                         65 year old male presenting to ER with concern for cough, malaise, nausea, diarrhea.  On exam, patient in no acute distress, vital signs stable.  His blood work was notable for mildly low white count, mild elevation in creatinine from baseline.  Suspect dehydration from reported diarrhea.  Given constellation of symptoms, suspect acute viral process.  Given Covid exposure, high suspicion for COVID-19.  Will send respiratory panel.  CXR consistent with viral pneumonia.  Patient had very brief desat to 88% on ambulation trial but able to ambulate without any significant difficulties, pulse ox remained mostly in mid 90s. No hypoxia or dyspnea at rest. I reviewed work up, concerns for covid, likely covid pneumonia with patient. Strongly recommended close recheck with primary, reviewed strict return precautions. At present believe he is okay for discharge and management in the out patient setting.    After the discussed management above, the patient was determined to be safe for discharge.  The patient was in agreement with this plan and all questions regarding their care were answered.  ED return precautions were discussed and the patient will return to the ED with any significant worsening of condition.   Final Clinical Impression(s) / ED Diagnoses Final diagnoses:  Suspected COVID-19 virus infection  Viral pneumonia  Leukopenia, unspecified type    Rx / DC Orders ED Discharge Orders    None       Lucrezia Starch, MD 06/20/20 2107

## 2020-06-20 NOTE — ED Notes (Signed)
Xray at the bedside.

## 2020-06-20 NOTE — ED Triage Notes (Signed)
Pt c/o diarrhea, nausea and headache x 3 days. Denies abdominal pain.

## 2020-06-20 NOTE — Discharge Instructions (Addendum)
If you develop difficulty in breathing, chest pain or other new concerning symptom, return to ER for reassessment.  Please follow isolation precautions as discussed.  Follow-up with your primary doctor for recheck as well.

## 2020-06-20 NOTE — ED Notes (Signed)
Pt attempted to urinate but could not produce urine at this time.

## 2020-06-20 NOTE — ED Notes (Signed)
This NT ambulated pt while measuring pulse oximetry. Pt o2 started at 95/96 whilst sitting, dropping lowest to 88 briefly while ambulating. Pt stated he had slight trouble breathing while ambulating.

## 2020-06-21 ENCOUNTER — Telehealth (HOSPITAL_COMMUNITY): Payer: Self-pay

## 2020-06-21 ENCOUNTER — Telehealth: Payer: Self-pay | Admitting: *Deleted

## 2020-06-21 DIAGNOSIS — Z23 Encounter for immunization: Secondary | ICD-10-CM | POA: Diagnosis not present

## 2020-06-21 LAB — CBG MONITORING, ED: Glucose-Capillary: 255 mg/dL — ABNORMAL HIGH (ref 70–99)

## 2020-06-21 NOTE — Telephone Encounter (Signed)
Called to Discuss with patient about Covid symptoms and the use of the monoclonal antibody infusion for those with mild to moderate Covid symptoms and at a high risk of hospitalization.     Pt appears to qualify for this infusion due to co-morbid conditions and/or a member of an at-risk group in accordance with the FDA Emergency Use Authorization.   Pt stated he received 1 infusion in the ED last night but needed 3-4 additional infusions. After reviewing the patient's chart, RN noted he received a MAB in the ED. Educated patient that it was a 1x dose no other doses were indicated. Educated patient about quarantining and monitoring symptoms. Pt understands without assistance.

## 2020-06-21 NOTE — Telephone Encounter (Signed)
RNCM contacted [facility] to refer pt for Covid @ Home program.  Derek Lowery is reviewing for intake.

## 2020-06-22 ENCOUNTER — Ambulatory Visit: Payer: Medicare Other | Admitting: Podiatry

## 2020-06-22 DIAGNOSIS — U071 COVID-19: Secondary | ICD-10-CM | POA: Diagnosis not present

## 2020-06-25 DIAGNOSIS — U071 COVID-19: Secondary | ICD-10-CM | POA: Diagnosis not present

## 2020-06-25 DIAGNOSIS — K529 Noninfective gastroenteritis and colitis, unspecified: Secondary | ICD-10-CM | POA: Diagnosis not present

## 2020-06-25 DIAGNOSIS — E113511 Type 2 diabetes mellitus with proliferative diabetic retinopathy with macular edema, right eye: Secondary | ICD-10-CM | POA: Diagnosis not present

## 2020-06-25 DIAGNOSIS — I10 Essential (primary) hypertension: Secondary | ICD-10-CM | POA: Diagnosis not present

## 2020-06-28 ENCOUNTER — Other Ambulatory Visit: Payer: Self-pay

## 2020-06-28 ENCOUNTER — Telehealth (INDEPENDENT_AMBULATORY_CARE_PROVIDER_SITE_OTHER): Payer: Self-pay | Admitting: Internal Medicine

## 2020-06-28 ENCOUNTER — Encounter (HOSPITAL_COMMUNITY): Payer: Self-pay

## 2020-06-28 ENCOUNTER — Inpatient Hospital Stay (HOSPITAL_COMMUNITY)
Admission: EM | Admit: 2020-06-28 | Discharge: 2020-07-02 | DRG: 177 | Disposition: A | Discharge status: Home Health | Payer: Medicare Other | Attending: Internal Medicine | Admitting: Internal Medicine

## 2020-06-28 ENCOUNTER — Emergency Department (HOSPITAL_COMMUNITY): Payer: Medicare Other

## 2020-06-28 DIAGNOSIS — H409 Unspecified glaucoma: Secondary | ICD-10-CM | POA: Diagnosis not present

## 2020-06-28 DIAGNOSIS — Z833 Family history of diabetes mellitus: Secondary | ICD-10-CM

## 2020-06-28 DIAGNOSIS — Z8249 Family history of ischemic heart disease and other diseases of the circulatory system: Secondary | ICD-10-CM | POA: Diagnosis not present

## 2020-06-28 DIAGNOSIS — I2510 Atherosclerotic heart disease of native coronary artery without angina pectoris: Secondary | ICD-10-CM | POA: Diagnosis not present

## 2020-06-28 DIAGNOSIS — Z841 Family history of disorders of kidney and ureter: Secondary | ICD-10-CM | POA: Diagnosis not present

## 2020-06-28 DIAGNOSIS — E871 Hypo-osmolality and hyponatremia: Secondary | ICD-10-CM | POA: Diagnosis not present

## 2020-06-28 DIAGNOSIS — E1122 Type 2 diabetes mellitus with diabetic chronic kidney disease: Secondary | ICD-10-CM | POA: Diagnosis not present

## 2020-06-28 DIAGNOSIS — A0839 Other viral enteritis: Secondary | ICD-10-CM | POA: Diagnosis present

## 2020-06-28 DIAGNOSIS — R0602 Shortness of breath: Secondary | ICD-10-CM

## 2020-06-28 DIAGNOSIS — Z7982 Long term (current) use of aspirin: Secondary | ICD-10-CM | POA: Diagnosis not present

## 2020-06-28 DIAGNOSIS — E1165 Type 2 diabetes mellitus with hyperglycemia: Secondary | ICD-10-CM | POA: Diagnosis not present

## 2020-06-28 DIAGNOSIS — E861 Hypovolemia: Secondary | ICD-10-CM | POA: Diagnosis not present

## 2020-06-28 DIAGNOSIS — R059 Cough, unspecified: Secondary | ICD-10-CM

## 2020-06-28 DIAGNOSIS — E860 Dehydration: Secondary | ICD-10-CM | POA: Diagnosis present

## 2020-06-28 DIAGNOSIS — R52 Pain, unspecified: Secondary | ICD-10-CM | POA: Diagnosis not present

## 2020-06-28 DIAGNOSIS — N179 Acute kidney failure, unspecified: Secondary | ICD-10-CM | POA: Diagnosis not present

## 2020-06-28 DIAGNOSIS — J9601 Acute respiratory failure with hypoxia: Secondary | ICD-10-CM

## 2020-06-28 DIAGNOSIS — E1142 Type 2 diabetes mellitus with diabetic polyneuropathy: Secondary | ICD-10-CM | POA: Diagnosis present

## 2020-06-28 DIAGNOSIS — M7989 Other specified soft tissue disorders: Secondary | ICD-10-CM | POA: Diagnosis not present

## 2020-06-28 DIAGNOSIS — Z794 Long term (current) use of insulin: Secondary | ICD-10-CM

## 2020-06-28 DIAGNOSIS — E785 Hyperlipidemia, unspecified: Secondary | ICD-10-CM | POA: Diagnosis not present

## 2020-06-28 DIAGNOSIS — R531 Weakness: Secondary | ICD-10-CM | POA: Diagnosis not present

## 2020-06-28 DIAGNOSIS — J1282 Pneumonia due to coronavirus disease 2019: Secondary | ICD-10-CM | POA: Diagnosis not present

## 2020-06-28 DIAGNOSIS — R0600 Dyspnea, unspecified: Secondary | ICD-10-CM | POA: Diagnosis not present

## 2020-06-28 DIAGNOSIS — Z9119 Patient's noncompliance with other medical treatment and regimen: Secondary | ICD-10-CM | POA: Diagnosis not present

## 2020-06-28 DIAGNOSIS — I129 Hypertensive chronic kidney disease with stage 1 through stage 4 chronic kidney disease, or unspecified chronic kidney disease: Secondary | ICD-10-CM | POA: Diagnosis present

## 2020-06-28 DIAGNOSIS — N3289 Other specified disorders of bladder: Secondary | ICD-10-CM | POA: Diagnosis not present

## 2020-06-28 DIAGNOSIS — Z79899 Other long term (current) drug therapy: Secondary | ICD-10-CM

## 2020-06-28 DIAGNOSIS — N1832 Chronic kidney disease, stage 3b: Secondary | ICD-10-CM | POA: Diagnosis present

## 2020-06-28 DIAGNOSIS — H548 Legal blindness, as defined in USA: Secondary | ICD-10-CM | POA: Diagnosis present

## 2020-06-28 DIAGNOSIS — U071 COVID-19: Principal | ICD-10-CM

## 2020-06-28 DIAGNOSIS — E1169 Type 2 diabetes mellitus with other specified complication: Secondary | ICD-10-CM

## 2020-06-28 DIAGNOSIS — I10 Essential (primary) hypertension: Secondary | ICD-10-CM | POA: Diagnosis not present

## 2020-06-28 DIAGNOSIS — R3989 Other symptoms and signs involving the genitourinary system: Secondary | ICD-10-CM | POA: Insufficient documentation

## 2020-06-28 DIAGNOSIS — M1990 Unspecified osteoarthritis, unspecified site: Secondary | ICD-10-CM | POA: Diagnosis not present

## 2020-06-28 DIAGNOSIS — R0902 Hypoxemia: Secondary | ICD-10-CM | POA: Diagnosis not present

## 2020-06-28 DIAGNOSIS — E118 Type 2 diabetes mellitus with unspecified complications: Secondary | ICD-10-CM

## 2020-06-28 LAB — COMPREHENSIVE METABOLIC PANEL
ALT: 40 U/L (ref 0–44)
AST: 43 U/L — ABNORMAL HIGH (ref 15–41)
Albumin: 2.2 g/dL — ABNORMAL LOW (ref 3.5–5.0)
Alkaline Phosphatase: 100 U/L (ref 38–126)
Anion gap: 11 (ref 5–15)
BUN: 35 mg/dL — ABNORMAL HIGH (ref 8–23)
CO2: 24 mmol/L (ref 22–32)
Calcium: 7.6 mg/dL — ABNORMAL LOW (ref 8.9–10.3)
Chloride: 96 mmol/L — ABNORMAL LOW (ref 98–111)
Creatinine, Ser: 1.77 mg/dL — ABNORMAL HIGH (ref 0.61–1.24)
GFR, Estimated: 43 mL/min — ABNORMAL LOW (ref >60)
Glucose, Bld: 129 mg/dL — ABNORMAL HIGH (ref 70–99)
Potassium: 3.7 mmol/L (ref 3.5–5.1)
Sodium: 131 mmol/L — ABNORMAL LOW (ref 135–145)
Total Bilirubin: 1.4 mg/dL — ABNORMAL HIGH (ref 0.3–1.2)
Total Protein: 6.2 g/dL — ABNORMAL LOW (ref 6.5–8.1)

## 2020-06-28 LAB — CBC WITH DIFFERENTIAL/PLATELET
Abs Immature Granulocytes: 0.04 10*3/uL (ref 0.00–0.07)
Basophils Absolute: 0 10*3/uL (ref 0.0–0.1)
Basophils Relative: 0 %
Eosinophils Absolute: 0.2 10*3/uL (ref 0.0–0.5)
Eosinophils Relative: 3 %
HCT: 31.9 % — ABNORMAL LOW (ref 39.0–52.0)
Hemoglobin: 10.7 g/dL — ABNORMAL LOW (ref 13.0–17.0)
Immature Granulocytes: 1 %
Lymphocytes Relative: 4 %
Lymphs Abs: 0.3 10*3/uL — ABNORMAL LOW (ref 0.7–4.0)
MCH: 29 pg (ref 26.0–34.0)
MCHC: 33.5 g/dL (ref 30.0–36.0)
MCV: 86.4 fL (ref 80.0–100.0)
Monocytes Absolute: 0.7 10*3/uL (ref 0.1–1.0)
Monocytes Relative: 9 %
Neutro Abs: 6.5 10*3/uL (ref 1.7–7.7)
Neutrophils Relative %: 83 %
Platelets: 233 10*3/uL (ref 150–400)
RBC: 3.69 MIL/uL — ABNORMAL LOW (ref 4.22–5.81)
RDW: 12.8 % (ref 11.5–15.5)
WBC: 7.7 10*3/uL (ref 4.0–10.5)
nRBC: 0 % (ref 0.0–0.2)

## 2020-06-28 LAB — HIV ANTIBODY (ROUTINE TESTING W REFLEX): HIV Screen 4th Generation wRfx: NONREACTIVE

## 2020-06-28 LAB — LACTATE DEHYDROGENASE: LDH: 382 U/L — ABNORMAL HIGH (ref 98–192)

## 2020-06-28 LAB — GLUCOSE, CAPILLARY: Glucose-Capillary: 279 mg/dL — ABNORMAL HIGH (ref 70–99)

## 2020-06-28 LAB — FIBRINOGEN: Fibrinogen: >800 mg/dL — ABNORMAL HIGH (ref 210–475)

## 2020-06-28 LAB — PROCALCITONIN: Procalcitonin: 0.8 ng/mL

## 2020-06-28 LAB — D-DIMER, QUANTITATIVE: D-Dimer, Quant: 1.85 ug/mL-FEU — ABNORMAL HIGH (ref 0.00–0.50)

## 2020-06-28 LAB — HEMOGLOBIN A1C
Hgb A1c MFr Bld: 10.9 % — ABNORMAL HIGH (ref 4.8–5.6)
Mean Plasma Glucose: 266.13 mg/dL

## 2020-06-28 LAB — TRIGLYCERIDES: Triglycerides: 75 mg/dL (ref <150)

## 2020-06-28 LAB — FERRITIN: Ferritin: 849 ng/mL — ABNORMAL HIGH (ref 24–336)

## 2020-06-28 LAB — TROPONIN I (HIGH SENSITIVITY): Troponin I (High Sensitivity): 10 ng/L (ref <18)

## 2020-06-28 LAB — LACTIC ACID, PLASMA: Lactic Acid, Venous: 1 mmol/L (ref 0.5–1.9)

## 2020-06-28 LAB — BRAIN NATRIURETIC PEPTIDE: B Natriuretic Peptide: 39.4 pg/mL (ref 0.0–100.0)

## 2020-06-28 LAB — C-REACTIVE PROTEIN: CRP: 23.1 mg/dL — ABNORMAL HIGH (ref <1.0)

## 2020-06-28 MED ORDER — ACETAMINOPHEN 325 MG PO TABS: 650.0000 mg | ORAL_TABLET | Freq: Four times a day (QID) | ORAL | Status: DC | PRN

## 2020-06-28 MED ORDER — ADULT MULTIVITAMIN W/MINERALS CH
1.0000 | ORAL_TABLET | Freq: Every day | ORAL | Status: DC
  Administered 2020-06-29 – 2020-07-02 (×5): 1 via ORAL
  Filled 2020-06-28 (×4): qty 1

## 2020-06-28 MED ORDER — ONDANSETRON HCL 4 MG PO TABS: 4.0000 mg | ORAL_TABLET | Freq: Four times a day (QID) | ORAL | Status: DC | PRN

## 2020-06-28 MED ORDER — LATANOPROST 0.005 % OP SOLN
1.0000 [drp] | Freq: Every day | OPHTHALMIC | Status: DC
  Administered 2020-06-28 – 2020-07-01 (×4): 1 [drp] via OPHTHALMIC
  Filled 2020-06-28: qty 2.5

## 2020-06-28 MED ORDER — LATANOPROSTENE BUNOD 0.024 % OP SOLN: 1.0000 [drp] | Freq: Every day | OPHTHALMIC | Status: DC

## 2020-06-28 MED ORDER — INSULIN ASPART 100 UNIT/ML ~~LOC~~ SOLN
5.0000 [IU] | Freq: Three times a day (TID) | SUBCUTANEOUS | Status: DC
  Administered 2020-06-29: 5 [IU] via SUBCUTANEOUS

## 2020-06-28 MED ORDER — ACETAMINOPHEN 650 MG RE SUPP: 650.0000 mg | Freq: Four times a day (QID) | RECTAL | Status: DC | PRN

## 2020-06-28 MED ORDER — ATORVASTATIN CALCIUM 40 MG PO TABS
40.0000 mg | ORAL_TABLET | Freq: Every day | ORAL | Status: DC
  Administered 2020-06-28 – 2020-07-02 (×5): 40 mg via ORAL
  Filled 2020-06-28 (×4): qty 1
  Filled 2020-06-28: qty 4

## 2020-06-28 MED ORDER — INSULIN GLARGINE 100 UNIT/ML ~~LOC~~ SOLN
15.0000 [IU] | Freq: Every day | SUBCUTANEOUS | Status: DC
  Administered 2020-06-28: 15 [IU] via SUBCUTANEOUS
  Filled 2020-06-28 (×2): qty 0.15

## 2020-06-28 MED ORDER — DORZOLAMIDE HCL 2 % OP SOLN
1.0000 [drp] | Freq: Two times a day (BID) | OPHTHALMIC | Status: DC
  Administered 2020-06-29 – 2020-07-02 (×7): 1 [drp] via OPHTHALMIC
  Filled 2020-06-28 (×2): qty 10

## 2020-06-28 MED ORDER — ASPIRIN EC 81 MG PO TBEC
81.0000 mg | DELAYED_RELEASE_TABLET | Freq: Every day | ORAL | Status: DC
  Administered 2020-06-28 – 2020-07-02 (×5): 81 mg via ORAL
  Filled 2020-06-28 (×5): qty 1

## 2020-06-28 MED ORDER — BRIMONIDINE TARTRATE 0.2 % OP SOLN
1.0000 [drp] | Freq: Two times a day (BID) | OPHTHALMIC | Status: DC
  Administered 2020-06-29 – 2020-07-02 (×7): 1 [drp] via OPHTHALMIC
  Filled 2020-06-28 (×2): qty 5

## 2020-06-28 MED ORDER — GUAIFENESIN-DM 100-10 MG/5ML PO SYRP
10.0000 mL | ORAL_SOLUTION | ORAL | Status: DC | PRN
  Administered 2020-06-28 – 2020-06-30 (×2): 10 mL via ORAL
  Filled 2020-06-28 (×3): qty 10

## 2020-06-28 MED ORDER — ONDANSETRON HCL 4 MG/2ML IJ SOLN: 4.0000 mg | Freq: Four times a day (QID) | INTRAMUSCULAR | Status: DC | PRN

## 2020-06-28 MED ORDER — TIMOLOL MALEATE 0.5 % OP SOLN
1.0000 [drp] | Freq: Two times a day (BID) | OPHTHALMIC | Status: DC
  Administered 2020-06-29 – 2020-07-02 (×7): 1 [drp] via OPHTHALMIC
  Filled 2020-06-28 (×2): qty 5

## 2020-06-28 MED ORDER — INSULIN LISPRO (1 UNIT DIAL) 100 UNIT/ML (KWIKPEN): 5.0000 [IU] | PEN_INJECTOR | Freq: Three times a day (TID) | SUBCUTANEOUS | Status: DC

## 2020-06-28 MED ORDER — SODIUM CHLORIDE 0.9 % IV SOLN
200.0000 mg | Freq: Once | INTRAVENOUS | Status: AC
  Administered 2020-06-28: 200 mg via INTRAVENOUS
  Filled 2020-06-28: qty 40

## 2020-06-28 MED ORDER — ENOXAPARIN SODIUM 40 MG/0.4ML ~~LOC~~ SOLN: 40.0000 mg | SUBCUTANEOUS | Status: DC

## 2020-06-28 MED ORDER — BARICITINIB 2 MG PO TABS
4.0000 mg | ORAL_TABLET | Freq: Every day | ORAL | Status: DC
  Administered 2020-06-28 – 2020-06-30 (×3): 4 mg via ORAL
  Filled 2020-06-28 (×3): qty 2

## 2020-06-28 MED ORDER — SODIUM CHLORIDE 0.9 % IV SOLN: 200.0000 mg | Freq: Once | INTRAVENOUS | Status: DC

## 2020-06-28 MED ORDER — SODIUM CHLORIDE 0.9 % IV SOLN: 100.0000 mg | Freq: Every day | INTRAVENOUS | Status: DC

## 2020-06-28 MED ORDER — LISINOPRIL 10 MG PO TABS: 10.0000 mg | ORAL_TABLET | Freq: Every day | ORAL | Status: DC

## 2020-06-28 MED ORDER — NETARSUDIL DIMESYLATE 0.02 % OP SOLN: 1.0000 [drp] | Freq: Every day | OPHTHALMIC | Status: DC

## 2020-06-28 MED ORDER — METHYLPREDNISOLONE SODIUM SUCC 125 MG IJ SOLR
60.0000 mg | Freq: Two times a day (BID) | INTRAMUSCULAR | Status: DC
  Administered 2020-06-28 – 2020-07-02 (×8): 60 mg via INTRAVENOUS
  Filled 2020-06-28 (×8): qty 2

## 2020-06-28 MED ORDER — INSULIN GLARGINE 100 UNIT/ML SOLOSTAR PEN: 15.0000 [IU] | PEN_INJECTOR | Freq: Every day | SUBCUTANEOUS | Status: DC

## 2020-06-28 MED ORDER — ENOXAPARIN SODIUM 40 MG/0.4ML ~~LOC~~ SOLN
40.0000 mg | SUBCUTANEOUS | Status: DC
  Administered 2020-06-28: 40 mg via SUBCUTANEOUS
  Filled 2020-06-28: qty 0.4

## 2020-06-28 MED ORDER — HYDROCHLOROTHIAZIDE 25 MG PO TABS: 12.5000 mg | ORAL_TABLET | Freq: Every day | ORAL | Status: DC

## 2020-06-28 MED ORDER — INSULIN ASPART 100 UNIT/ML ~~LOC~~ SOLN
0.0000 [IU] | Freq: Three times a day (TID) | SUBCUTANEOUS | Status: DC
  Administered 2020-06-29: 5 [IU] via SUBCUTANEOUS

## 2020-06-28 MED ORDER — IPRATROPIUM-ALBUTEROL 20-100 MCG/ACT IN AERS
1.0000 | INHALATION_SPRAY | Freq: Four times a day (QID) | RESPIRATORY_TRACT | Status: DC
  Administered 2020-06-28 – 2020-06-29 (×2): 1 via RESPIRATORY_TRACT
  Filled 2020-06-28: qty 4

## 2020-06-28 MED ORDER — SODIUM CHLORIDE 0.9 % IV SOLN
100.0000 mg | INTRAVENOUS | Status: AC
  Administered 2020-06-29 – 2020-07-02 (×4): 100 mg via INTRAVENOUS
  Filled 2020-06-28 (×4): qty 20

## 2020-06-28 MED ORDER — HYDROCOD POLST-CPM POLST ER 10-8 MG/5ML PO SUER
5.0000 mL | Freq: Two times a day (BID) | ORAL | Status: DC
  Administered 2020-06-28 – 2020-07-02 (×8): 5 mL via ORAL
  Filled 2020-06-28 (×8): qty 5

## 2020-06-28 NOTE — ED Triage Notes (Signed)
Pt arrived to ED via EMS w/ c/o increased shob and decreased O2 sats. Pt is covid positive and has been symptomatic x 2 weeks and tested positive last wekk. Pt was home for 1 week on 4L O2 w/ home health follow-up. Over the past 3 days, pt O2 sats decreased and oxygen demand increased. Pt arrived to ED on 10L O2 via [facility]. C/o shob, dry cough, diminished taste and smell.

## 2020-06-28 NOTE — ED Provider Notes (Signed)
[facility] EMERGENCY DEPARTMENT Provider Note   CSN: [id number] Arrival date & time: 06/28/20  1101     History Chief Complaint  Derek Lowery presents with  . Covid Positive    Derek Lowery is a 64 y.o. male.  The history is provided by the Derek Lowery and medical records. No language interpreter was used.  Shortness of Breath Severity:  Severe Onset quality:  Gradual Duration:  3 days Timing:  Constant Progression:  Worsening Chronicity:  New Context: URI (covid)   Relieved by:  Oxygen Worsened by:  Nothing Ineffective treatments:  Oxygen (home O2 not enough) Associated symptoms: cough and wheezing (at times)   Associated symptoms: no abdominal pain, no chest pain, no claudication, no diaphoresis, no fever, no headaches, no hemoptysis, no neck pain, no rash, no sputum production, no syncope and no vomiting   Risk factors: no hx of PE/DVT        Past Medical History:  Diagnosis Date  . Arthritis   . CAD (coronary artery disease)    NSTEMI 06/2011:  LHC 07/18/11: Proximal diagonal 60%, distal LAD with a diabetic appearance and 60% stenosis, OM2 with an occluded superior branch and an inferior branch with 90%, EF 55% with inferior hypokinesis.  PCI: Promus DES to the OM2 inferior branch.  This vessel provides collaterals to the superior branch which remained occluded.  Echocardiogram 07/18/11: EF 60%, normal wall motion.  . Cataract   . Essential hypertension, benign   . Glaucoma   . Hypercholesteremia   . Internal hemorrhoids   . Noncompliance   . Type 2 diabetes mellitus ([place]) 1990    Derek Lowery Active Problem List   Diagnosis Date Noted  . Proliferative diabetic retinopathy of right eye with macular edema associated with type 2 diabetes mellitus ([place]) 11/30/2019  . Rubeosis iridis of right eye 11/30/2019  . Diabetic visual loss better eye: severe vision impairment; lesser eye: blind, without macular edema, with proliferative retinopathy, associated  with type 2 diabetes mellitus ([place]) 11/30/2019  . Glaucoma with pupillary seclusion, unspecified laterality, severe stage 11/30/2019  . Trigger middle finger of right hand 10/17/2019  . Pain due to onychomycosis of toenails of both feet 08/07/2019  . Educated about COVID-19 virus infection 12/02/2018  . RBBB 12/02/2018  . Benign paroxysmal positional vertigo 06/25/2018  . Pre-ulcerative corn or callous 10/13/2017  . Anemia 10/13/2017  . Chronic diarrhea 06/09/2017  . Microalbuminuria 03/07/2017  . Dry skin 03/05/2017  . Glaucoma 03/05/2017  . Legally blind 03/05/2017  . Retinopathy due to secondary diabetes ([place]) 02/06/2016  . Diabetic polyneuropathy associated with type 2 diabetes mellitus ([place]) 01/12/2015  . Fournier's gangrene into true pelvis s/p I&D 05/05/2014 05/05/2014  . Type 2 diabetes mellitus with complication, with long-term current use of insulin ([place]) 07/17/2011  . HLD (hyperlipidemia) 07/17/2011  . Coronary artery disease of native artery of native heart with stable angina pectoris ([place]) 07/17/2011  . Essential hypertension 07/17/2011    Past Surgical History:  Procedure Laterality Date  . CATARACT EXTRACTION Right 2019   Dr. Katy Fitch  . CHOLECYSTECTOMY N/A 09/21/2017   Procedure: LAPAROSCOPIC CHOLECYSTECTOMY WITH INTRAOPERATIVE CHOLANGIOGRAM;  Surgeon: Michael Boston, MD;  Location: [facility] ORS;  Service: General;  Laterality: N/A;  . COLONOSCOPY  2000   Dr. Collene Mares  . EYE SURGERY    . IRRIGATION AND DEBRIDEMENT ABSCESS Left 05/05/2014   Procedure: IRRIGATION AND DEBRIDEMENT ABSCESS left buttock;  Surgeon: Michael Boston, MD;  Location: [facility];  Service: General;  Laterality: Left;  .  LEFT HEART CATHETERIZATION WITH CORONARY ANGIOGRAM N/A 07/18/2011   Procedure: LEFT HEART CATHETERIZATION WITH CORONARY ANGIOGRAM;  Surgeon: Hillary Bow, MD;  Location: [facility]ATH LAB;  Service: Cardiovascular;  Laterality: N/A;  . PERCUTANEOUS CORONARY STENT INTERVENTION (PCI-S)  07/18/2011    Procedure: PERCUTANEOUS CORONARY STENT INTERVENTION (PCI-S);  Surgeon: Hillary Bow, MD;  Location[facility] CATH LAB;  Service: Cardiovascular;;       Family History  Problem Relation Age of Onset  . Coronary artery disease Father        Developed in his 2s  . Kidney disease Father   . Diabetes Father   . Melanoma Father   . Rectal cancer Father   . Heart failure Mother   . Hypertension Mother   . Diabetes Sister   . Diabetes Brother   . Colon cancer Neg Hx   . Esophageal cancer Neg Hx   . Stomach cancer Neg Hx     Social History   Tobacco Use  . Smoking status: Never Smoker  . Smokeless tobacco: Never Used  Vaping Use  . Vaping Use: Never used  Substance Use Topics  . Alcohol use: No  . Drug use: No    Home Medications Prior to Admission medications   Medication Sig Start Date End Date Taking? Authorizing Provider  ammonium lactate (AMLACTIN) 12 % lotion Apply to both feet twice daily for dry skin. 10/31/19   Marzetta Board, DPM  aspirin EC 81 MG tablet Take 1 tablet (81 mg total) by mouth daily. 07/12/19   Argentina Donovan, PA-C  atorvastatin (LIPITOR) 40 MG tablet TAKE 1 TABLET BY MOUTH EVERY DAY 03/09/20   Ladell Pier, MD  blood glucose meter kit and supplies KIT Dispense based on Derek Lowery and insurance preference. Use up to four times daily as directed. One Touch Verio 09/24/18   Ladell Pier, MD  brimonidine (ALPHAGAN) 0.2 % ophthalmic solution INSTILL 1 DROP INTO RIGHT EYE TWICE A DAY 07/23/18   [provider]  Continuous Blood Gluc Sensor (FREESTYLE LIBRE SENSOR SYSTEM) MISC Change sensor Q 2 wks 10/17/19   Ladell Pier, MD  dorzolamide (TRUSOPT) 2 % ophthalmic solution Place 1 drop into the right eye 2 (two) times daily.    [provider]  furosemide (LASIX) 20 MG tablet TAKE 1 TABLET (20 MG TOTAL) BY MOUTH DAILY. STOP LISINOPRIL/HCTZ 01/10/20   Ladell Pier, MD  glucose blood (ACCU-CHEK AVIVA PLUS) test strip Use as  instructed for 3 times daily testing of blood sugar. E11.9 10/20/17   Ladell Pier, MD  glucose monitoring kit (FREESTYLE) monitoring kit 1 each by Does not apply route as needed for other. 06/24/18   Ladell Pier, MD  hydrochlorothiazide (HYDRODIURIL) 12.5 MG tablet TAKE 1 TABLET BY MOUTH EVERY DAY 01/12/20   Ladell Pier, MD  insulin glargine (LANTUS SOLOSTAR) 100 UNIT/ML Solostar Pen INJECT 34 UNITS INTO THE SKIN DAILY. Please make PCP appointment. 06/15/20   Ladell Pier, MD  insulin lispro (HUMALOG KWIKPEN) 100 UNIT/ML KwikPen 10 units TID with meals 10/17/19   Ladell Pier, MD  Insulin Pen Needle (BD PEN NEEDLE NANO U/F) 32G X 4 MM MISC USE AS DIRECTED 3 TIMES A DAY. Please schedule an appointment for additional refills. 07/12/19   Argentina Donovan, PA-C  Lancets MISC Use as directed.  Accu chek 2 12/21/17   Ladell Pier, MD  latanoprost (XALATAN) 0.005 % ophthalmic solution Place 1 drop into the right  eye at bedtime. 07/01/19   [provider]  lisinopril (ZESTRIL) 10 MG tablet  10/28/19   [provider]  lisinopril (ZESTRIL) 20 MG tablet Take 1 tablet (20 mg total) by mouth daily. 10/17/19   Ladell Pier, MD  Multiple Vitamins-Minerals (MULTIVITAMIN WITH MINERALS) tablet Take 1 tablet by mouth daily.    [provider]  RHOPRESSA 0.02 % SOLN Place 1 drop into the right eye at bedtime. 01/24/20   [provider]  timolol (BETIMOL) 0.5 % ophthalmic solution Place 1 drop into the right eye 2 (two) times daily.    [provider]  VYZULTA 0.024 % SOLN Place 1 drop into the right eye at bedtime. 09/19/19   [provider]    Allergies    Derek Lowery has no known allergies.  Review of Systems   Review of Systems  Constitutional: Positive for fatigue. Negative for chills, diaphoresis and fever.  HENT: Negative for congestion.   Eyes: Negative for visual disturbance.  Respiratory: Positive for cough, chest  tightness, shortness of breath and wheezing (at times). Negative for hemoptysis and sputum production.   Cardiovascular: Negative for chest pain, claudication and syncope.  Gastrointestinal: Negative for abdominal pain, constipation, diarrhea, nausea and vomiting.  Genitourinary: Negative for frequency.  Musculoskeletal: Negative for back pain and neck pain.  Skin: Negative for rash and wound.  Neurological: Negative for dizziness, weakness, light-headedness and headaches.  Psychiatric/Behavioral: Negative for agitation and confusion.  All other systems reviewed and are negative.   Physical Exam Updated Vital Signs BP (!) 165/93   Pulse (!) 101   Temp 97.7 F (36.5 C) (Oral)   Resp 13   Ht 5' 10"  (1.778 m)   Wt 112.9 kg   SpO2 94%   BMI 35.73 kg/m   Physical Exam Vitals and nursing note reviewed.  Constitutional:      General: Derek Lowery is not in acute distress.    Appearance: Derek Lowery is well-developed. Derek Lowery is not ill-appearing, toxic-appearing or diaphoretic.  HENT:     Head: Normocephalic and atraumatic.     Nose: Nose normal. No congestion or rhinorrhea.     Mouth/Throat:     Mouth: Mucous membranes are moist.     Pharynx: No oropharyngeal exudate or posterior oropharyngeal erythema.  Eyes:     Conjunctiva/sclera: Conjunctivae normal.     Pupils: Pupils are equal, round, and reactive to light.  Cardiovascular:     Rate and Rhythm: Normal rate and regular rhythm.     Pulses: Normal pulses.     Heart sounds: No murmur heard.   Pulmonary:     Effort: Tachypnea present. No respiratory distress.     Breath sounds: Rhonchi present. No wheezing or rales.  Chest:     Chest wall: No tenderness.  Abdominal:     General: Abdomen is flat.     Palpations: Abdomen is soft.     Tenderness: There is no abdominal tenderness. There is no right CVA tenderness, left CVA tenderness, guarding or rebound.  Musculoskeletal:        General: No tenderness.     Cervical back: Neck supple. No  tenderness.     Right lower leg: Edema (at baseline reportedly) present.     Left lower leg: Edema (at baseline) present.  Skin:    General: Skin is warm and dry.     Capillary Refill: Capillary refill takes less than 2 seconds.     Findings: No erythema.  Neurological:  General: No focal deficit present.     Mental Status: Derek Lowery is alert.     ED Results / Procedures / Treatments   Labs (all labs ordered are listed, but only abnormal results are displayed) Labs Reviewed  CBC WITH DIFFERENTIAL/PLATELET - Abnormal; Notable for the following components:      Result Value   RBC 3.69 (*)    Hemoglobin 10.7 (*)    HCT 31.9 (*)    Lymphs Abs 0.3 (*)    All other components within normal limits  COMPREHENSIVE METABOLIC PANEL - Abnormal; Notable for the following components:   Sodium 131 (*)    Chloride 96 (*)    Glucose, Bld 129 (*)    BUN 35 (*)    Creatinine, Ser 1.77 (*)    Calcium 7.6 (*)    Total Protein 6.2 (*)    Albumin 2.2 (*)    AST 43 (*)    Total Bilirubin 1.4 (*)    GFR, Estimated 43 (*)    All other components within normal limits  D-DIMER, QUANTITATIVE (NOT AT [facility]) - Abnormal; Notable for the following components:   D-Dimer, Quant 1.85 (*)    All other components within normal limits  LACTATE DEHYDROGENASE - Abnormal; Notable for the following components:   LDH 382 (*)    All other components within normal limits  FERRITIN - Abnormal; Notable for the following components:   Ferritin 849 (*)    All other components within normal limits  FIBRINOGEN - Abnormal; Notable for the following components:   Fibrinogen >800 (*)    All other components within normal limits  C-REACTIVE PROTEIN - Abnormal; Notable for the following components:   CRP 23.1 (*)    All other components within normal limits  CULTURE, BLOOD (ROUTINE X 2)  CULTURE, BLOOD (ROUTINE X 2)  LACTIC ACID, PLASMA  PROCALCITONIN  TRIGLYCERIDES  BRAIN NATRIURETIC PEPTIDE  LACTIC ACID, PLASMA    TROPONIN I (HIGH SENSITIVITY)  TROPONIN I (HIGH SENSITIVITY)    EKG EKG Interpretation  Date/Time:  Thursday June 28 2020 11:49:51 EST Ventricular Rate:  71 PR Interval:    QRS Duration: 121 QT Interval:  445 QTC Calculation: 484 R Axis:   -24 Text Interpretation: sinus rhythm Right bundle branch block when compared to prior, similar apperance. No STEMI Confirmed by Antony Blackbird [phone]) on 06/28/2020 1:55:53 PM   Radiology DG Chest Port 1 View  Result Date: 06/28/2020 CLINICAL DATA:  Coronavirus infection with worsening shortness of breath and oxygen requirement. EXAM: PORTABLE CHEST 1 VIEW COMPARISON:  06/20/2020 FINDINGS: Heart and mediastinal shadows remain normal. Left system stent. Worsening of widespread bilateral hazy and patchy pulmonary infiltrates consistent with worsened viral pneumonia. No lobar consolidation or collapse. No effusion. No significant bone finding. IMPRESSION: Worsening of widespread bilateral hazy and patchy pulmonary infiltrates consistent with worsened viral pneumonia. No lobar consolidation or collapse. Electronically Signed   By: Nelson Chimes M.D.   On: 06/28/2020 11:43    Procedures Procedures (including critical care time)  CRITICAL CARE Performed by: Gwenyth Allegra Jeremyah Jelley Total critical care time: 35 minutes Critical care time was exclusive of separately billable procedures and treating other patients. Critical care was necessary to treat or prevent imminent or life-threatening deterioration. Critical care was time spent personally by me on the following activities: development of treatment plan with Derek Lowery and/or surrogate as well as nursing, discussions with consultants, evaluation of Derek Lowery's response to treatment, examination of Derek Lowery, obtaining history from Derek Lowery or surrogate, ordering  and performing treatments and interventions, ordering and review of laboratory studies, ordering and review of radiographic studies, pulse oximetry  and re-evaluation of Derek Lowery's condition.  Derek Lowery was evaluated in Emergency Department on 06/28/2020 for the symptoms described in the history of present illness. Derek Lowery was evaluated in the context of the global COVID-19 pandemic, which necessitated consideration that the Derek Lowery might be at risk for infection with the SARS-CoV-2 virus that causes COVID-19. Institutional protocols and algorithms that pertain to the evaluation of patients at risk for COVID-19 are in a state of rapid change based on information released by regulatory bodies including the CDC and federal and state organizations. These policies and algorithms were followed during the Derek Lowery's care in the ED.   Medications Ordered in ED Medications - No data to display  ED Course  I have reviewed the triage vital signs and the nursing notes.  Pertinent labs & imaging results that were available during my care of the Derek Lowery were reviewed by me and considered in my medical decision making (see chart for details).    MDM Rules/Calculators/A&P                          FAYE SANFILIPPO is a 64 y.o. male with a past medical history significant for hypertension, hypercholesterolemia, diabetes, CAD and recent diagnosis of COVID-19 infection last week who presents with worsening hypoxia and shortness of breath.  Derek Lowery reports Derek Lowery diagnosed with Covid last week and was never on oxygen before that.  Derek Lowery reports has been on 4 L nasal cannula and having evaluation daily by home health.  Reports last 3 days his breathing is worsened.  Today, despite the 4 L nasal cannula, Derek Lowery was having oxygen saturations in the mid 80s.  Derek Lowery was very short of breath.  Derek Lowery denies any chest pain or palpitations, denies nausea, vomiting, diaphoresis, constipation, diarrhea, or urinary changes.  Derek Lowery reports his legs are not edematous but Derek Lowery just feels more short of breath.  EMS placed him on 2 L nonrebreather and his oxygen is now in the 90s.  Derek Lowery is feeling better  with the oxygen.  They heard some wheezing initially in the bases of his lungs and gave him Solu-Medrol and that has improved.  On my exam, Derek Lowery's lungs have some faint coarseness but I did not appreciate any wheezing.  No chest or back tenderness.  No murmur.  Abdomen is nontender.  Legs have some mild edema but Derek Lowery reports is at his baseline.  Good pulses in extremities.  Derek Lowery resting comfortably now that Derek Lowery is on 2 L.  EKG shows No STEMI.   Clinically I suspect his COVID-19 has simply progressed and worsened causing this increased oxygen requirement.  As Derek Lowery is now on 10 L to maintain oxygen saturations in the 90s, do not feel safe for discharge home.  We will get the screening admission lab orders for COVID-19 however given lack of any chest pain, leg pain, leg swelling, lower suspicion for PE at this time.  After work-up is completed, will call for admission for further respiratory management for this Derek Lowery with worsened hypoxia.   X-ray shows worsening opacities concerning for viral pneumonia.  No lobar consolidation or collapse.  Derek Lowery does have increased kidney function compared to baseline.  Now that his work-up is completed, will call for admission.  His D-dimer is elevated with a Covid order set however, with his lack of any chest pain, will  defer to medicine team if they would like a CT PE study or not.    Final Clinical Impression(s) / ED Diagnoses Final diagnoses:  COVID-19  Cough  Hypoxia     Clinical Impression: 1. COVID-19   2. Cough   3. Hypoxia     Disposition: Admit  This note was prepared with assistance of Dragon voice recognition software. Occasional wrong-word or sound-a-like substitutions may have occurred due to the inherent limitations of voice recognition software.     Chan Rosasco, Gwenyth Allegra, MD 06/28/20 [phone]

## 2020-06-28 NOTE — ED Notes (Signed)
Derek Lowery  [phone] brother please call with an update the sister in the contact has covid and the other contact is unavailable

## 2020-06-28 NOTE — Telephone Encounter (Signed)
Called 3 xs. Attempted to schedule f/u appt with PCP. No answer.

## 2020-06-28 NOTE — H&P (Addendum)
History and Physical    Derek Lowery FYT:[id number] DOB: Jul 31, 1956 DOA: 06/28/2020  PCP: Ladell Pier, MD   Patient coming from: Home   Chief Complaint: dyspnea   HPI: Derek Lowery is a 64 y.o. male with medical history significant of ischemic heart disease, coronary artery disease, hypertension, dyslipidemia, type 2 diabetes mellitus and glaucoma who was diagnosed with SARS COVID-19 November 3.  His symptoms started October 28 with diarrhea, nausea, vomiting, generalized weakness, loss of taste, loss of smell and poor appetite.  No significant dyspnea, fevers or chills.  Positive sick contacts at home, sister. He has been not vaccinated for COVID 19.   On the day of his diagnosis, November 3, he was evaluated in the emergency department and he was diagnosed with SARS COVID 19 viral pneumonia. In the ED his oxygen saturation remained mostly mid 90s with only brief oxygen desaturation down to 88% with ambulation.  Patient received 500 cc of normal saline intravenously, IV Zofran and one infusion of monoclonal antibody. He was discharged home in a stable condition.  For the following 3 to 4 days his symptoms were slowly improving. Unfortunately for the last 4 days he has been experiencing dyspnea which has been progressive in nature, moderate to severe in intensity, worse with exertion, no improving factors, and associated with dry cough.  He had further oxygen desaturation at home, and home health arranged him to have supplemental oxygen that he has been using for the last 3 days, up to 4 L/min per nasal cannula.  Today his oxygenation remained in the 80s despite supplemental oxygen and he was transferred to the hospital for further evaluation.  ED Course: Patient was found in respiratory distress, he was placed on nonrebreather mask, 10 L/min oxygen, reaching oxygen saturation of 100%.  He was successfully weaned to 6 L/min per nasal cannula and he was referred for further  hospitalization.  Review of Systems:  1. General: No fevers, no chills, no weight gain or weight loss 2. ENT: No runny nose or sore throat, no hearing disturbances 3. Pulmonary: positive dyspnea and dry cough as mentioned in HPI, but no wheezing or hemoptysis 4. Cardiovascular: No angina, claudication, lower extremity edema, pnd or orthopnea 5. Gastrointestinal: Positive nausea, vomiting, and diarrhea as mentioned in the HPI  6. Hematology: No easy bruisability or frequent infections 7. Urology: No dysuria, hematuria or increased urinary frequency 8. Dermatology: No rashes. 9. Neurology: No seizures or paresthesias 10. Musculoskeletal: No joint pain or deformities  Past Medical History:  Diagnosis Date  . Arthritis   . CAD (coronary artery disease)    NSTEMI 06/2011:  LHC 07/18/11: Proximal diagonal 60%, distal LAD with a diabetic appearance and 60% stenosis, OM2 with an occluded superior branch and an inferior branch with 90%, EF 55% with inferior hypokinesis.  PCI: Promus DES to the OM2 inferior branch.  This vessel provides collaterals to the superior branch which remained occluded.  Echocardiogram 07/18/11: EF 60%, normal wall motion.  . Cataract   . Essential hypertension, benign   . Glaucoma   . Hypercholesteremia   . Internal hemorrhoids   . Noncompliance   . Type 2 diabetes mellitus ([place]) 1990    Past Surgical History:  Procedure Laterality Date  . CATARACT EXTRACTION Right 2019   Dr. Katy Fitch  . CHOLECYSTECTOMY N/A 09/21/2017   Procedure: LAPAROSCOPIC CHOLECYSTECTOMY WITH INTRAOPERATIVE CHOLANGIOGRAM;  Surgeon: Michael Boston, MD;  Location: [facility];  Service: General;  Laterality: N/A;  . COLONOSCOPY  2000  Dr. Collene Mares  . EYE SURGERY    . IRRIGATION AND DEBRIDEMENT ABSCESS Left 05/05/2014   Procedure: IRRIGATION AND DEBRIDEMENT ABSCESS left buttock;  Surgeon: Michael Boston, MD;  Location: [facility];  Service: General;  Laterality: Left;  . LEFT HEART CATHETERIZATION WITH CORONARY  ANGIOGRAM N/A 07/18/2011   Procedure: LEFT HEART CATHETERIZATION WITH CORONARY ANGIOGRAM;  Surgeon: Hillary Bow, MD;  Location: [facility] CATH LAB;  Service: Cardiovascular;  Laterality: N/A;  . PERCUTANEOUS CORONARY STENT INTERVENTION (PCI-S)  07/18/2011   Procedure: PERCUTANEOUS CORONARY STENT INTERVENTION (PCI-S);  Surgeon: Hillary Bow, MD;  Location: [facility] CATH LAB;  Service: Cardiovascular;;     reports that he has never smoked. He has never used smokeless tobacco. He reports that he does not drink alcohol and does not use drugs.  No Known Allergies  Family History  Problem Relation Age of Onset  . Coronary artery disease Father        Developed in his 76s  . Kidney disease Father   . Diabetes Father   . Melanoma Father   . Rectal cancer Father   . Heart failure Mother   . Hypertension Mother   . Diabetes Sister   . Diabetes Brother   . Colon cancer Neg Hx   . Esophageal cancer Neg Hx   . Stomach cancer Neg Hx      Prior to Admission medications   Medication Sig Start Date End Date Taking? Authorizing Provider  ammonium lactate (AMLACTIN) 12 % lotion Apply to both feet twice daily for dry skin. 10/31/19   Marzetta Board, DPM  aspirin EC 81 MG tablet Take 1 tablet (81 mg total) by mouth daily. 07/12/19   Argentina Donovan, PA-C  atorvastatin (LIPITOR) 40 MG tablet TAKE 1 TABLET BY MOUTH EVERY DAY Patient taking differently: Take 40 mg by mouth daily.  03/09/20   Ladell Pier, MD  blood glucose meter kit and supplies KIT Dispense based on patient and insurance preference. Use up to four times daily as directed. One Touch Verio 09/24/18   Ladell Pier, MD  brimonidine (ALPHAGAN) 0.2 % ophthalmic solution INSTILL 1 DROP INTO RIGHT EYE TWICE A DAY 07/23/18   [provider]  Continuous Blood Gluc Sensor (FREESTYLE LIBRE SENSOR SYSTEM) MISC Change sensor Q 2 wks 10/17/19   Ladell Pier, MD  dorzolamide (TRUSOPT) 2 % ophthalmic solution Place 1 drop into  the right eye 2 (two) times daily.    [provider]  furosemide (LASIX) 20 MG tablet TAKE 1 TABLET (20 MG TOTAL) BY MOUTH DAILY. STOP LISINOPRIL/HCTZ 01/10/20   Ladell Pier, MD  glucose blood (ACCU-CHEK AVIVA PLUS) test strip Use as instructed for 3 times daily testing of blood sugar. E11.9 10/20/17   Ladell Pier, MD  glucose monitoring kit (FREESTYLE) monitoring kit 1 each by Does not apply route as needed for other. 06/24/18   Ladell Pier, MD  hydrochlorothiazide (HYDRODIURIL) 12.5 MG tablet TAKE 1 TABLET BY MOUTH EVERY DAY 01/12/20   Ladell Pier, MD  insulin glargine (LANTUS SOLOSTAR) 100 UNIT/ML Solostar Pen INJECT 34 UNITS INTO THE SKIN DAILY. Please make PCP appointment. 06/15/20   Ladell Pier, MD  insulin lispro (HUMALOG KWIKPEN) 100 UNIT/ML KwikPen 10 units TID with meals 10/17/19   Ladell Pier, MD  Insulin Pen Needle (BD PEN NEEDLE NANO U/F) 32G X 4 MM MISC USE AS DIRECTED 3 TIMES A DAY. Please schedule an appointment for additional refills. 07/12/19  Argentina Donovan, PA-C  Lancets MISC Use as directed.  Accu chek 2 12/21/17   Ladell Pier, MD  latanoprost (XALATAN) 0.005 % ophthalmic solution Place 1 drop into the right eye at bedtime. 07/01/19   [provider]  lisinopril (ZESTRIL) 10 MG tablet  10/28/19   [provider]  lisinopril (ZESTRIL) 20 MG tablet Take 1 tablet (20 mg total) by mouth daily. 10/17/19   Ladell Pier, MD  Multiple Vitamins-Minerals (MULTIVITAMIN WITH MINERALS) tablet Take 1 tablet by mouth daily.    [provider]  RHOPRESSA 0.02 % SOLN Place 1 drop into the right eye at bedtime. 01/24/20   [provider]  timolol (BETIMOL) 0.5 % ophthalmic solution Place 1 drop into the right eye 2 (two) times daily.    [provider]  VYZULTA 0.024 % SOLN Place 1 drop into the right eye at bedtime. 09/19/19   [provider]    Physical Exam: Vitals:   06/28/20 1415  06/28/20 1430 06/28/20 1445 06/28/20 1500  BP: 134/69 (!) 147/79 137/68 (!) 144/75  Pulse: 73 70 73 78  Resp: _0 (!) 26  Temp:      TempSrc:      SpO2: 96% 98% 96% 96%  Weight:      Height:        Vitals:   06/28/20 1415 06/28/20 1430 06/28/20 1445 06/28/20 1500  BP: 134/69 (!) 147/79 137/68 (!) 144/75  Pulse: 73 70 73 78  Resp: _1 (!) 26  Temp:      TempSrc:      SpO2: 96% 98% 96% 96%  Weight:      Height:        General: deconditioned and ill looking appearing  Neurology: Awake and alert, non focal Head and Neck. Head normocephalic. Neck supple with no adenopathy or thyromegaly.   E ENT: mild pallor, no icterus, oral mucosa moist Cardiovascular: No JVD. S1-S2 present, rhythmic, no gallops, rubs, or murmurs. +/++ non pitting lower extremity edema. Pulmonary: positive breath sounds bilaterally with no wheezing.  Gastrointestinal. Abdomen soft and non tender Skin. No rashes, dry skin Musculoskeletal: no joint deformities    Labs on Admission: I have personally reviewed following labs and imaging studies  CBC: Recent Labs  Lab 06/28/20 1211  WBC 7.7  NEUTROABS 6.5  HGB 10.7*  HCT 31.9*  MCV 86.4  PLT 381   Basic Metabolic Panel: Recent Labs  Lab 06/28/20 1211  NA 131*  K 3.7  CL 96*  CO2 24  GLUCOSE 129*  BUN 35*  CREATININE 1.77*  CALCIUM 7.6*   GFR: Estimated Creatinine Clearance: 54.5 mL/min (A) (by C-G formula based on SCr of 1.77 mg/dL (H)). Liver Function Tests: Recent Labs  Lab 06/28/20 1211  AST 43*  ALT 40  ALKPHOS 100  BILITOT 1.4*  PROT 6.2*  ALBUMIN 2.2*   No results for input(s): LIPASE, AMYLASE in the last 168 hours. No results for input(s): AMMONIA in the last 168 hours. Coagulation Profile: No results for input(s): INR, PROTIME in the last 168 hours. Cardiac Enzymes: No results for input(s): CKTOTAL, CKMB, CKMBINDEX, TROPONINI in the last 168 hours. BNP (last 3 results) No results for input(s): PROBNP in the  last 8760 hours. HbA1C: No results for input(s): HGBA1C in the last 72 hours. CBG: No results for input(s): GLUCAP in the last 168 hours. Lipid Profile: Recent Labs    06/28/20 1211  TRIG 75   Thyroid Function  Tests: No results for input(s): TSH, T4TOTAL, FREET4, T3FREE, THYROIDAB in the last 72 hours. Anemia Panel: Recent Labs    06/28/20 1211  FERRITIN 849*   Urine analysis:    Component Value Date/Time   COLORURINE YELLOW 06/20/2020 1513   APPEARANCEUR HAZY (A) 06/20/2020 1513   LABSPEC 1.021 06/20/2020 1513   PHURINE 5.0 06/20/2020 1513   GLUCOSEU >=500 (A) 06/20/2020 1513   HGBUR SMALL (A) 06/20/2020 1513   BILIRUBINUR NEGATIVE 06/20/2020 1513   BILIRUBINUR neg 12/13/2014 1447   KETONESUR NEGATIVE 06/20/2020 1513   PROTEINUR >=300 (A) 06/20/2020 1513   UROBILINOGEN 1.0 12/13/2014 1447   UROBILINOGEN 1.0 05/05/2014 0630   NITRITE NEGATIVE 06/20/2020 1513   LEUKOCYTESUR NEGATIVE 06/20/2020 1513    Radiological Exams on Admission: DG Chest Port 1 View  Result Date: 06/28/2020 CLINICAL DATA:  Coronavirus infection with worsening shortness of breath and oxygen requirement. EXAM: PORTABLE CHEST 1 VIEW COMPARISON:  06/20/2020 FINDINGS: Heart and mediastinal shadows remain normal. Left system stent. Worsening of widespread bilateral hazy and patchy pulmonary infiltrates consistent with worsened viral pneumonia. No lobar consolidation or collapse. No effusion. No significant bone finding. IMPRESSION: Worsening of widespread bilateral hazy and patchy pulmonary infiltrates consistent with worsened viral pneumonia. No lobar consolidation or collapse. Electronically Signed   By: Nelson Chimes M.D.   On: 06/28/2020 11:43    EKG: Independently reviewed.  71 bpm, normal axis, normal intervals, sinus rhythm, no ST segment or T wave changes.  Assessment/Plan Principal Problem:   Pneumonia due to COVID-19 virus Active Problems:   Type 2 diabetes mellitus with complication, with  long-term current use of insulin (HCC)   HLD (hyperlipidemia)   Essential hypertension   Diabetic polyneuropathy associated with type 2 diabetes mellitus (HCC)   Glaucoma   Legally blind   Acute respiratory failure with hypoxia ([place])    64 year old male with multiple medical problems including hypertension, type 2 diabetes mellitus and ischemic heart disease who has been diagnosed with SARS COVID-19, 8 days ago.  Presents with worsening dyspnea for the last 4 days, with positive oxygen desaturation down to the 80s% despite supplemental oxygen per nasal cannula.  He received monoclonal antibody infusion for COVID-19 on the day of his diagnosis.  Despite outpatient management his condition continued to deteriorate.  On his initial physical examination his blood pressure 165/93, heart rate 101, respiratory rate 24 to 28, oxygen saturation 94% on 6 L/min per nasal cannula.  Dry mucous membranes, lungs with no wheezing, heart S1-S2, present rhythmic, soft abdomen, +/++ nonpitting lower extremity edema. Sodium 131, potassium 3.7, chloride 96, bicarb 24, glucose 129, BUN 35, creatinine 1.77, white count 7.7, hemoglobin 10.7, hematocrit 31.9, platelets 233.  D-dimer 1.85. Chest radiograph with bilateral patchy, alveolar/interstitial infiltrates, lower lobes and base of upper lobes, mainly at the periphery.  Significantly worse compared from film 06/20/2020.  Mr. Canterbury is being hospitalized with a working diagnosis of acute hypoxic respiratory failure due to SARS COVID-19 viral pneumonia.   1.  Acute hypoxic respiratory failure due to SARS COVID-19 viral pneumonia. He has worsening patchy bilateral pulmonary infiltrates, along with significant increase in oxygen requirements.  He does have high risk of continue worsening.  Patient will be admitted to the medical ward, continue supplemental oxygen per nasal cannula, to keep oxygen saturation greater than 88%. Medical therapy with systemic corticosteroids  60 mg methylprednisolone IV every 12 hours and IV remdesivir.  In the setting of worsening hypoxic respiratory failure, I explained the patient in detail,  the use of advanced therapy: Baricitinib.  I informed him the risks of this intervention including immunosuppression, opportunistic infections, allergic reactions and others.  I explained him the experimental nature of this interventions, in the face of current COVID 19 pandemic, along with the potential benefits of improvement of hypoxic respiratory failure and systemic inflammation.  He has agreed to proceed.   Add baricitinib 4 mg daily, per protocol.  Antitussive agents, bronchodilators, and airway clearing techniques with incentive spirometer and flutter valve.  Encourage mobility, out of bed 3 times daily with meals, PT/OT. Follow-up on inflammatory markers and signs of respiratory distress.  2.  Acute kidney injury, hyponatremia.  Worsening kidney function and hyponatremia likely due to hypovolemia/dehydration.  Continue to encourage p.o. intake, in the setting of viral pneumonia will follow a restrictive IV fluid strategy. Hold diuretics, furosemide and hydrochlorothiazide.  Follow up renal function in am, avoid hypotension and nephrotoxic medications.   3.  Type 2 diabetes mellitus/dyslipidemia..  Continue glucose coverage with insulin regimen.  His admission glucose is down to 129, will resume his basal and premeal insulin at 50% of his home dose.  Add insulin sliding scale for glucose coverage and monitoring.  Continue atorvastatin.  4.  Hypertension.  For now we will hold on antihypertensives due to increase risk of hypotension, and worsening renal function.  5.  Coronary artery disease.  Continue aspirin atorvastatin, patient is chest pain-free.  6. Glaucoma. Continue with eye droops.   Status is: Inpatient  Remains inpatient appropriate because:IV treatments appropriate due to intensity of illness or inability to take  PO   Dispo: The patient is from: Home              Anticipated d/c is to: Home              Anticipated d/c date is: > 3 days              Patient currently is not medically stable to d/c.   DVT prophylaxis: Enoxaparin   Code Status:   Full  Family Communication:  Unable to reach any family, called his sister and brother over the phone. (sister's phone listed does not belong to Mrs. Pavon).    Consults called:  None   Admission status:  Inpatient    Mauricio Gerome Apley MD [facility]   06/28/2020, 3:16 PM

## 2020-06-28 NOTE — Progress Notes (Signed)
Patient arrived from ED at 1820pm, patient a/ox4, no distress noted. Patient stands with minimal assist, patient on 6L O2 via [place]. CHG bath given and skin assessed. Call light, phone, and urinal in reach. Patient instructed to use call bell for  Assistance. Bed in low position and bed alarm on.

## 2020-06-29 ENCOUNTER — Inpatient Hospital Stay (HOSPITAL_COMMUNITY): Payer: Medicare Other

## 2020-06-29 DIAGNOSIS — R52 Pain, unspecified: Secondary | ICD-10-CM | POA: Diagnosis not present

## 2020-06-29 DIAGNOSIS — M7989 Other specified soft tissue disorders: Secondary | ICD-10-CM | POA: Diagnosis not present

## 2020-06-29 LAB — COMPREHENSIVE METABOLIC PANEL
ALT: 34 U/L (ref 0–44)
AST: 35 U/L (ref 15–41)
Albumin: 2 g/dL — ABNORMAL LOW (ref 3.5–5.0)
Alkaline Phosphatase: 97 U/L (ref 38–126)
Anion gap: 13 (ref 5–15)
BUN: 39 mg/dL — ABNORMAL HIGH (ref 8–23)
CO2: 19 mmol/L — ABNORMAL LOW (ref 22–32)
Calcium: 7.8 mg/dL — ABNORMAL LOW (ref 8.9–10.3)
Chloride: 99 mmol/L (ref 98–111)
Creatinine, Ser: 1.67 mg/dL — ABNORMAL HIGH (ref 0.61–1.24)
GFR, Estimated: 46 mL/min — ABNORMAL LOW (ref >60)
Glucose, Bld: 316 mg/dL — ABNORMAL HIGH (ref 70–99)
Potassium: 4 mmol/L (ref 3.5–5.1)
Sodium: 131 mmol/L — ABNORMAL LOW (ref 135–145)
Total Bilirubin: 1.2 mg/dL (ref 0.3–1.2)
Total Protein: 6.1 g/dL — ABNORMAL LOW (ref 6.5–8.1)

## 2020-06-29 LAB — FERRITIN: Ferritin: 877 ng/mL — ABNORMAL HIGH (ref 24–336)

## 2020-06-29 LAB — OSMOLALITY: Osmolality: 304 mOsm/kg — ABNORMAL HIGH (ref 275–295)

## 2020-06-29 LAB — GLUCOSE, CAPILLARY
Glucose-Capillary: 298 mg/dL — ABNORMAL HIGH (ref 70–99)
Glucose-Capillary: 332 mg/dL — ABNORMAL HIGH (ref 70–99)
Glucose-Capillary: 244 mg/dL — ABNORMAL HIGH (ref 70–99)
Glucose-Capillary: 223 mg/dL — ABNORMAL HIGH (ref 70–99)

## 2020-06-29 LAB — URIC ACID: Uric Acid, Serum: 8.6 mg/dL (ref 3.7–8.6)

## 2020-06-29 LAB — PROCALCITONIN: Procalcitonin: 1.48 ng/mL

## 2020-06-29 LAB — C-REACTIVE PROTEIN: CRP: 25.9 mg/dL — ABNORMAL HIGH (ref <1.0)

## 2020-06-29 LAB — BRAIN NATRIURETIC PEPTIDE: B Natriuretic Peptide: 111.3 pg/mL — ABNORMAL HIGH (ref 0.0–100.0)

## 2020-06-29 LAB — D-DIMER, QUANTITATIVE: D-Dimer, Quant: 1.4 ug/mL-FEU — ABNORMAL HIGH (ref 0.00–0.50)

## 2020-06-29 MED ORDER — LOPERAMIDE HCL 2 MG PO CAPS
2.0000 mg | ORAL_CAPSULE | ORAL | Status: DC | PRN
  Administered 2020-06-29 (×3): 2 mg via ORAL
  Filled 2020-06-29 (×3): qty 1

## 2020-06-29 MED ORDER — INSULIN ASPART 100 UNIT/ML ~~LOC~~ SOLN
4.0000 [IU] | Freq: Three times a day (TID) | SUBCUTANEOUS | Status: DC
  Administered 2020-06-29 – 2020-07-02 (×9): 4 [IU] via SUBCUTANEOUS

## 2020-06-29 MED ORDER — LACTATED RINGERS IV SOLN: INTRAVENOUS | Status: AC

## 2020-06-29 MED ORDER — INSULIN ASPART 100 UNIT/ML ~~LOC~~ SOLN
0.0000 [IU] | Freq: Every day | SUBCUTANEOUS | Status: DC
  Administered 2020-06-29: 2 [IU] via SUBCUTANEOUS
  Administered 2020-06-30: 3 [IU] via SUBCUTANEOUS

## 2020-06-29 MED ORDER — INSULIN GLARGINE 100 UNIT/ML ~~LOC~~ SOLN
20.0000 [IU] | Freq: Two times a day (BID) | SUBCUTANEOUS | Status: DC
  Administered 2020-06-29 – 2020-07-02 (×7): 20 [IU] via SUBCUTANEOUS
  Filled 2020-06-29 (×9): qty 0.2

## 2020-06-29 MED ORDER — ENOXAPARIN SODIUM 40 MG/0.4ML ~~LOC~~ SOLN
40.0000 mg | Freq: Two times a day (BID) | SUBCUTANEOUS | Status: DC
  Administered 2020-06-29 – 2020-07-01 (×6): 40 mg via SUBCUTANEOUS
  Filled 2020-06-29 (×6): qty 0.4

## 2020-06-29 MED ORDER — INSULIN ASPART 100 UNIT/ML ~~LOC~~ SOLN
0.0000 [IU] | Freq: Three times a day (TID) | SUBCUTANEOUS | Status: DC
  Administered 2020-06-29: 11 [IU] via SUBCUTANEOUS
  Administered 2020-06-29: 5 [IU] via SUBCUTANEOUS
  Administered 2020-06-30: 4 [IU] via SUBCUTANEOUS
  Administered 2020-06-30: 5 [IU] via SUBCUTANEOUS
  Administered 2020-06-30: 8 [IU] via SUBCUTANEOUS
  Administered 2020-07-01 (×3): 5 [IU] via SUBCUTANEOUS

## 2020-06-29 NOTE — Progress Notes (Signed)
Bilateral Lower Ext. study completed.   See CVProc for preliminary results.   Griffin Basil, RDMS, RVT

## 2020-06-29 NOTE — Evaluation (Signed)
Occupational Therapy Evaluation Patient Details Name: Derek Lowery MRN: [id number] DOB: 09-05-1955 Today's Date: 06/29/2020    History of Present Illness 64 y.o. male with medical history significant of ischemic heart disease, coronary artery disease, hypertension, dyslipidemia, type 2 diabetes mellitus and glaucoma who was diagnosed with SARS COVID-19 November 3, He presented to the hospital 11/11 with shortness of breath and was diagnosed with acute hypoxic respiratory failure due to COVID-19 pneumonia along with mild gastroenteritis from COVID-19.   Clinical Impression   PTA pt living with family and functioning at mod I level for ADLs, and has IADLs managed by family. At time of eval, pt presents with ability to complete and sit <> stands at min guard/min A level with RW for steadying. Pt then completed mobility at min guard level to bathroom for toilet transfer. Pt requires min A from lower surfaces for power up. Pt then stood at sink for x2 grooming tasks. He started session on 5L [place], ultimately needed 10L [place] to complete session with SpO2 >88%. Pt was able to recover in chair for 5 mins and was placed back on 5L . Pt then completed IS pulls with 2500 mL and good techniques. Given current status, recommend HHOT to support safety, BADL engagement, and independent PLOF. OT will continue to follow per POC listed below.     Follow Up Recommendations  Home health OT;Supervision/Assistance - 24 hour    Equipment Recommendations  3 in 1 bedside commode    Recommendations for Other Services       Precautions / Restrictions Precautions Precautions: Fall Precaution Comments: baseline tremor Restrictions Weight Bearing Restrictions: No      Mobility Bed Mobility               General bed mobility comments: pt up in chair, returned to chair    Transfers Overall transfer level: Needs assistance Equipment used: Rolling walker (2 wheeled) Transfers: Sit to/from Stand Sit to  Stand: Min guard         General transfer comment: increased time and effort    Balance Overall balance assessment: Needs assistance Sitting-balance support: No upper extremity supported;Feet supported Sitting balance-Leahy Scale: Good     Standing balance support: No upper extremity supported;Bilateral upper extremity supported;During functional activity Standing balance-Leahy Scale: Fair Standing balance comment: static stand without UE support, RW for amb                           ADL either performed or assessed with clinical judgement   ADL Overall ADL's : Needs assistance/impaired Eating/Feeding: Set up;Sitting   Grooming: Minimal assistance;Standing Grooming Details (indicate cue type and reason): intermittent min A to manage packaging and [place] tasks due to tremors Upper Body Bathing: Minimal assistance;Sitting   Lower Body Bathing: Moderate assistance;Sitting/lateral leans;Sit to/from stand   Upper Body Dressing : Minimal assistance;Sitting   Lower Body Dressing: Moderate assistance;Sitting/lateral leans;Sit to/from stand   Toilet Transfer: Minimal assistance;Moderate assistance;Ambulation;Regular Toilet;Grab bars;RW   Toileting- Clothing Manipulation and Hygiene: Moderate assistance;Sit to/from stand       Functional mobility during ADLs: Min guard;Rolling walker;Cueing for safety       Vision Baseline Vision/History: Glaucoma Patient Visual Report: No change from baseline Additional Comments: reports a history of glaucoma and does not have eye drops and feels his eyes are exacerbated     Perception     Praxis      Pertinent Vitals/Pain Pain Assessment: No/denies pain  Hand Dominance Right   Extremity/Trunk Assessment Upper Extremity Assessment Upper Extremity Assessment: Generalized weakness (intention tremors noted that pt says are baseline)   Lower Extremity Assessment Lower Extremity Assessment: Generalized weakness   Cervical  / Trunk Assessment Cervical / Trunk Assessment: Kyphotic   Communication Communication Communication: No difficulties   Cognition Arousal/Alertness: Awake/alert Behavior During Therapy: WFL for tasks assessed/performed Overall Cognitive Status: No family/caregiver present to determine baseline cognitive functioning Area of Impairment: Safety/judgement;Problem solving                         Safety/Judgement: Decreased awareness of deficits   Problem Solving: Slow processing;Requires verbal cues General Comments: required increased cues to problem solve basic tasks   General Comments  SpO2 91% at rest on 5L. Desat to 82% during mobility on 6L. Increased to 10L for return to recliner. After seated rest break in recliner, able to return to 5L at end of session.    Exercises     Shoulder Instructions      Home Living Family/patient expects to be discharged to:: Private residence Living Arrangements: Other relatives (nephew, sister, and sisters boyfriend) Available Help at Discharge: Family;Available 24 hours/day Type of Home: Mobile home Home Access: Stairs to enter Entrance Stairs-Number of Steps: 4 Entrance Stairs-Rails: Right Home Layout: One level     Bathroom Shower/Tub: Teacher, early years/pre: Standard     Home Equipment: Environmental consultant - 2 wheels;Cane - single point;Tub bench;Hand held shower head          Prior Functioning/Environment Level of Independence: Independent with assistive device(s)        Comments: Pt uses cane in house, RW outside of house. Has IADL management, does not drive or get out much        OT Problem List: Decreased strength;Decreased knowledge of use of DME or AE;Decreased activity tolerance;Decreased cognition;Impaired balance (sitting and/or standing);Decreased coordination      OT Treatment/Interventions: Self-care/ADL training;Therapeutic exercise;Patient/family education;Balance training;Energy  conservation;Therapeutic activities;DME and/or AE instruction    OT Goals(Current goals can be found in the care plan section) Acute Rehab OT Goals Patient Stated Goal: home OT Goal Formulation: With patient Time For Goal Achievement: 07/13/20 Potential to Achieve Goals: Good  OT Frequency: Min 2X/week   Barriers to D/C:            Co-evaluation PT/OT/SLP Co-Evaluation/Treatment: Yes Reason for Co-Treatment: For patient/therapist safety;To address functional/ADL transfers PT goals addressed during session: Mobility/safety with mobility;Balance;Proper use of DME OT goals addressed during session: ADL's and self-care;Proper use of Adaptive equipment and DME;Strengthening/ROM      AM-PAC OT "6 Clicks" Daily Activity     Outcome Measure Help from another person eating meals?: A Little Help from another person taking care of personal grooming?: A Little Help from another person toileting, which includes using toliet, bedpan, or urinal?: A Little Help from another person bathing (including washing, rinsing, drying)?: A Lot Help from another person to put on and taking off regular upper body clothing?: A Little Help from another person to put on and taking off regular lower body clothing?: A Lot 6 Click Score: 16   End of Session Equipment Utilized During Treatment: Gait belt;Rolling walker Nurse Communication: Mobility status  Activity Tolerance: Patient tolerated treatment well Patient left: in chair;with call bell/phone within reach  OT Visit Diagnosis: Unsteadiness on feet (R26.81);Other abnormalities of gait and mobility (R26.89);Muscle weakness (generalized) (M62.81)  Time: 5183-3582 OT Time Calculation (min): 36 min Charges:  OT General Charges $OT Visit: 1 Visit OT Evaluation $OT Eval Moderate Complexity: [place], MSOT, OTR/L Acute Rehabilitation Services [facility] Office Number: [phone] Pager: [phone]  Zenovia Jarred 06/29/2020,  1:21 PM

## 2020-06-29 NOTE — Evaluation (Signed)
Physical Therapy Evaluation Patient Details Name: Derek Lowery MRN: [id number] DOB: July 07, 1956 Today's Date: 06/29/2020   History of Present Illness  64 y.o. male with medical history significant of ischemic heart disease, coronary artery disease, hypertension, dyslipidemia, type 2 diabetes mellitus and glaucoma who was diagnosed with SARS COVID-19 November 3, He presented to the hospital 11/11 with shortness of breath and was diagnosed with acute hypoxic respiratory failure due to COVID-19 pneumonia along with mild gastroenteritis from COVID-19.    Clinical Impression  Pt admitted with above diagnosis. PTA pt lived at home with family. He was modified independent mobility and ADLs using RW vs SPC, mainly household distances. On eval, he required min guard assist transfers and ambulation 15' x 2 with RW. Shaking/trembling noted during mobility. Pt reports this is present at baseline. SpO2 91% at rest on 5L. Initiated mobility on 6L. Desat to 82% requiring increase to 8L then 10L to maintain SpO2 88-90%. Able to return to 5L at end of session in recliner. Pt will benefit from skilled PT to increase their independence and safety with mobility to allow discharge to the venue listed below.       Follow Up Recommendations Home health PT;Supervision for mobility/OOB    Equipment Recommendations  None recommended by PT    Recommendations for Other Services       Precautions / Restrictions Precautions Precautions: Fall Restrictions Weight Bearing Restrictions: No      Mobility  Bed Mobility               General bed mobility comments: Pt received in recliner.    Transfers Overall transfer level: Needs assistance Equipment used: Rolling walker (2 wheeled) Transfers: Sit to/from Stand Sit to Stand: Min guard         General transfer comment: increased time and effort  Ambulation/Gait Ambulation/Gait assistance: Min guard Gait Distance (Feet): 15 Feet (x 2) Assistive  device: Rolling walker (2 wheeled) Gait Pattern/deviations: Step-through pattern;Decreased stride length Gait velocity: decreased Gait velocity interpretation: <1.31 ft/sec, indicative of household ambulator General Gait Details: Pt ambulated on 6L during first gait trial to bathroom. Desat to 82%. Unable to improve with seated rest break. O2 increased to 8L with improvement to 90%. Initiated second gait trial on 8L. Pt stopped at sink for ADLs. Desat to 85% during static standing. O2 increased to 10L for return to recliner.  Stairs            Wheelchair Mobility    Modified Rankin (Stroke Patients Only)       Balance Overall balance assessment: Needs assistance Sitting-balance support: No upper extremity supported;Feet supported Sitting balance-Leahy Scale: Good     Standing balance support: No upper extremity supported;Bilateral upper extremity supported;During functional activity Standing balance-Leahy Scale: Fair Standing balance comment: static stand without UE support, RW for amb                             Pertinent Vitals/Pain Pain Assessment: No/denies pain    Home Living Family/patient expects to be discharged to:: Private residence Living Arrangements: Other relatives (nephew, sister, and sister's boyfriend) Available Help at Discharge: Family;Available 24 hours/day Type of Home: Mobile home Home Access: Stairs to enter Entrance Stairs-Rails: Right Entrance Stairs-Number of Steps: 4 Home Layout: One level Home Equipment: Walker - 2 wheels;Cane - single point;Tub bench;Hand held shower head      Prior Function Level of Independence: Independent with assistive device(s)  Comments: Pt uses cane in house, RW outside of house. Has IADL management, does not drive or get out much     Hand Dominance   Dominant Hand: Right    Extremity/Trunk Assessment   Upper Extremity Assessment Upper Extremity Assessment: Defer to OT evaluation     Lower Extremity Assessment Lower Extremity Assessment: Generalized weakness    Cervical / Trunk Assessment Cervical / Trunk Assessment: Kyphotic  Communication   Communication: No difficulties  Cognition Arousal/Alertness: Awake/alert Behavior During Therapy: WFL for tasks assessed/performed Overall Cognitive Status: Within Functional Limits for tasks assessed                                        General Comments General comments (skin integrity, edema, etc.): SpO2 91% at rest on 5L. Desat to 82% during mobility on 6L. Increased to 10L for return to recliner. After seated rest break in recliner, able to return to 5L at end of session.    Exercises     Assessment/Plan    PT Assessment Patient needs continued PT services  PT Problem List Decreased strength;Decreased mobility;Decreased activity tolerance;Cardiopulmonary status limiting activity;Decreased balance       PT Treatment Interventions Therapeutic activities;Gait training;Therapeutic exercise;Patient/family education;Balance training;Stair training;Functional mobility training    PT Goals (Current goals can be found in the Care Plan section)  Acute Rehab PT Goals Patient Stated Goal: home PT Goal Formulation: With patient Time For Goal Achievement: 07/13/20 Potential to Achieve Goals: Good    Frequency Min 3X/week   Barriers to discharge        Co-evaluation PT/OT/SLP Co-Evaluation/Treatment: Yes Reason for Co-Treatment: For patient/therapist safety;To address functional/ADL transfers PT goals addressed during session: Mobility/safety with mobility;Balance;Proper use of DME         AM-PAC PT "6 Clicks" Mobility  Outcome Measure Help needed turning from your back to your side while in a flat bed without using bedrails?: A Little Help needed moving from lying on your back to sitting on the side of a flat bed without using bedrails?: A Little Help needed moving to and from a bed to a  chair (including a wheelchair)?: A Little Help needed standing up from a chair using your arms (e.g., wheelchair or bedside chair)?: A Little Help needed to walk in hospital room?: A Little Help needed climbing 3-5 steps with a railing? : A Lot 6 Click Score: 17    End of Session Equipment Utilized During Treatment: Oxygen Activity Tolerance: Patient tolerated treatment well Patient left: in chair;with call bell/phone within reach Nurse Communication: Mobility status PT Visit Diagnosis: Other abnormalities of gait and mobility (R26.89);Difficulty in walking, not elsewhere classified (R26.2);Muscle weakness (generalized) (M62.81)    Time: 5361-4431 PT Time Calculation (min) (ACUTE ONLY): 36 min   Charges:   PT Evaluation $PT Eval Moderate Complexity: 1 Mod          Lorrin Goodell, PT  Office # [phone] Pager [phone]   [name] 06/29/2020, 11:47 AM

## 2020-06-29 NOTE — Progress Notes (Signed)
PROGRESS NOTE                                                                                                                                                                                                             Patient Demographics:    Derek Lowery, is a 64 y.o. male, DOB - [birth year], HDQ:[id number]  Outpatient Primary MD for the patient is Ladell Pier, MD    LOS - 1  Admit date - 06/28/2020    Chief Complaint  Patient presents with  . Covid Positive       Brief Narrative (HPI from H&P) Derek Lowery is a 64 y.o. male with medical history significant of ischemic heart disease, coronary artery disease, hypertension, dyslipidemia, type 2 diabetes mellitus and glaucoma who was diagnosed with SARS COVID-19 November 3, resented to the hospital with shortness of breath and was diagnosed with acute hypoxic respiratory failure due to COVID-19 pneumonia along with mild gastroenteritis from COVID-19.   Subjective:    Derek Lowery today has, No headache, No chest pain, No abdominal pain - No Nausea, No new weakness tingling or numbness, improved shortness of breath.   Assessment  & Plan :     1. Acute Hypoxic Resp. Failure due to Acute Covid 19 Viral Pneumonitis during the ongoing 2020 Covid 19 Pandemic - he unfortunately unvaccinated and has incurred moderate to severe parenchymal lung injury, being treated with IV steroids, Remdesivir and Baricitinib.  Ischial positive date 06/20/2020.  Encouraged the patient to sit up in chair in the daytime use I-S and flutter valve for pulmonary toiletry and then prone in bed when at night.  Will advance activity and titrate down oxygen as possible.    SpO2: 94 % O2 Flow Rate (L/min): 5 L/min  Recent Labs  Lab 06/28/20 1211 06/29/20 0043 06/29/20 0800  WBC 7.7  --   --   HGB 10.7*  --   --   HCT 31.9*  --   --   PLT 233  --   --   CRP 23.1* 25.9*  --   BNP 39.4   --  111.3*  DDIMER 1.85* 1.40*  --   PROCALCITON 0.80  --   --   AST 43* 35  --   ALT 40 34  --   ALKPHOS 100 97  --  BILITOT 1.4* 1.2  --   ALBUMIN 2.2* 2.0*  --   LATICACIDVEN 1.0  --   --     2.  Mild elevation of D-dimer likely due to inflammation from COVID-19, moderate dose Lovenox along with leg ultrasound.   3.  AKI.  Check urine electrolytes, hydrate and monitor.  4.  COVID-19 and used gastroenteritis.  Improved.    5. CAD.  On aspirin, statin, symptom-free.  6.  DM type II.  On Lantus and sliding scale.  Dose adjusted 06/29/2020 to account for steroid use.  Lab Results  Component Value Date   HGBA1C 10.9 (H) 06/28/2020   CBG (last 3)  Recent Labs    06/28/20 2057 06/29/20 0757  GLUCAP 279* 298*      Condition - Extremely Guarded  Family Communication  :    Phone numbers listed for Lucas Mallow [phone] - Wrong on 06/29/2020 at 10:06 AM  brother Joneen Caraway [phone].  All cannot be completed message on 06/30/2019 2110 7 AM   Code Status :  Full  Consults  :  None  Procedures  :    Leg [place]  PUD Prophylaxis : PPI  Disposition Plan  :    Status is: Inpatient  Remains inpatient appropriate because:IV treatments appropriate due to intensity of illness or inability to take PO   Dispo: The patient is from: Home              Anticipated d/c is to: Home              Anticipated d/c date is: > 3 days              Patient currently is not medically stable to d/c.   DVT Prophylaxis  :  Lovenox   Lab Results  Component Value Date   PLT 233 06/28/2020    Diet :  Diet Order            Diet heart healthy/carb modified Room service appropriate? Yes; Fluid consistency: Thin  Diet effective now                  Inpatient Medications  Scheduled Meds: . aspirin EC  81 mg Oral Daily  . atorvastatin  40 mg Oral Daily  . baricitinib  4 mg Oral Daily  . brimonidine  1 drop Right Eye BID  . chlorpheniramine-HYDROcodone  5 mL Oral Q12H  .  dorzolamide  1 drop Right Eye BID  . enoxaparin (LOVENOX) injection  40 mg Subcutaneous Q24H  . insulin aspart  0-9 Units Subcutaneous TID WC  . insulin aspart  5 Units Subcutaneous TID WC  . insulin glargine  15 Units Subcutaneous QHS  . Ipratropium-Albuterol  1 puff Inhalation Q6H  . latanoprost  1 drop Right Eye QHS  . Latanoprostene Bunod  1 drop Right Eye QHS  . methylPREDNISolone (SOLU-MEDROL) injection  60 mg Intravenous Q12H  . multivitamin with minerals  1 tablet Oral Daily  . Netarsudil Dimesylate  1 drop Right Eye QHS  . timolol  1 drop Right Eye BID   Continuous Infusions: . lactated ringers 100 mL/hr at 06/29/20 0924  . remdesivir 100 mg in NS 100 mL 100 mg (06/29/20 0935)   PRN Meds:.acetaminophen **OR** [DISCONTINUED] acetaminophen, guaiFENesin-dextromethorphan, loperamide, [DISCONTINUED] ondansetron **OR** ondansetron (ZOFRAN) IV  Antibiotics  :    Anti-infectives (From admission, onward)   Start     Dose/Rate Route Frequency Ordered Stop   06/29/20 1000  remdesivir 100 mg  in sodium chloride 0.9 % 100 mL IVPB  Status:  Discontinued       "Followed by" Linked Group Details   100 mg 200 mL/hr over 30 Minutes Intravenous Daily 06/28/20 1528 06/28/20 1652   06/29/20 1000  remdesivir 100 mg in sodium chloride 0.9 % 100 mL IVPB       "Followed by" Linked Group Details   100 mg 200 mL/hr over 30 Minutes Intravenous Every 24 hours 06/28/20 1521 07/03/20 0959   06/28/20 1600  remdesivir 200 mg in sodium chloride 0.9% 250 mL IVPB       "Followed by" Linked Group Details   200 mg 580 mL/hr over 30 Minutes Intravenous Once 06/28/20 1521 06/28/20 1726   06/28/20 1530  remdesivir 200 mg in sodium chloride 0.9% 250 mL IVPB  Status:  Discontinued       "Followed by" Linked Group Details   200 mg 580 mL/hr over 30 Minutes Intravenous Once 06/28/20 1528 06/28/20 1652       Time Spent in minutes  30   Lala Lund M.D on 06/29/2020 at 10:05 AM  To page go to  [email or web address] - password [facility]  [facility] -  Office  [phone]     See all Orders from today for further details    Objective:   Vitals:   06/28/20 2341 06/29/20 0347 06/29/20 0406 06/29/20 0747  BP: (!) 165/83 (!) 166/76  (!) 148/66  Pulse: 91 79 92 80  Resp: 15 (!) 21 18 14   Temp: 97.8 F (36.6 C) 97.8 F (36.6 C)  97.9 F (36.6 C)  TempSrc: Oral Oral  Oral  SpO2: 93% 98% 93% 94%  Weight:      Height:        Wt Readings from Last 3 Encounters:  06/28/20 107.6 kg  01/13/20 111.8 kg  10/17/19 110.7 kg     Intake/Output Summary (Last 24 hours) at 06/29/2020 1005 Last data filed at 06/29/2020 0400 Gross per 24 hour  Intake 1090 ml  Output --  Net 1090 ml     Physical Exam  Awake Alert, No new F.N deficits, Normal affect [place].AT,PERRAL Supple Neck,No JVD, No cervical lymphadenopathy appriciated.  Symmetrical Chest wall movement, Good air movement bilaterally, CTAB RRR,No Gallops,Rubs or new Murmurs, No Parasternal Heave +ve B.Sounds, Abd Soft, No tenderness, No organomegaly appriciated, No rebound - guarding or rigidity. No Cyanosis, Clubbing or edema, No new Rash or bruise       Data Review:    CBC Recent Labs  Lab 06/28/20 1211  WBC 7.7  HGB 10.7*  HCT 31.9*  PLT 233  MCV 86.4  MCH 29.0  MCHC 33.5  RDW 12.8  LYMPHSABS 0.3*  MONOABS 0.7  EOSABS 0.2  BASOSABS 0.0    Recent Labs  Lab 06/28/20 1211 06/28/20 1649 06/29/20 0043 06/29/20 0800  NA 131*  --  131*  --   K 3.7  --  4.0  --   CL 96*  --  99  --   CO2 24  --  19*  --   GLUCOSE 129*  --  316*  --   BUN 35*  --  39*  --   CREATININE 1.77*  --  1.67*  --   CALCIUM 7.6*  --  7.8*  --   AST 43*  --  35  --   ALT 40  --  34  --   ALKPHOS 100  --  97  --   BILITOT 1.4*  --  1.2  --   ALBUMIN 2.2*  --  2.0*  --   CRP 23.1*  --  25.9*  --   DDIMER 1.85*  --  1.40*  --   PROCALCITON 0.80  --   --   --   LATICACIDVEN 1.0  --   --   --   HGBA1C  --  10.9*  --   --   BNP  39.4  --   --  111.3*    ------------------------------------------------------------------------------------------------------------------ Recent Labs    06/28/20 1211  TRIG 75    Lab Results  Component Value Date   HGBA1C 10.9 (H) 06/28/2020   ------------------------------------------------------------------------------------------------------------------ No results for input(s): TSH, T4TOTAL, T3FREE, THYROIDAB in the last 72 hours.  Invalid input(s): FREET3  Cardiac Enzymes No results for input(s): CKMB, TROPONINI, MYOGLOBIN in the last 168 hours.  Invalid input(s): CK ------------------------------------------------------------------------------------------------------------------    Component Value Date/Time   BNP 111.3 (H) 06/29/2020 0800    Micro Results Recent Results (from the past 240 hour(s))  Respiratory Panel by RT PCR (Flu A&B, Covid) - Nasopharyngeal Swab     Status: Abnormal   Collection Time: 06/20/20  7:38 PM   Specimen: Nasopharyngeal Swab  Result Value Ref Range Status   SARS Coronavirus 2 by RT PCR POSITIVE (A) NEGATIVE Final    Comment: RESULT CALLED TO, READ BACK BY AND VERIFIED WITH: A COLEMAN RN 2105 06/20/20 A BROWNING (NOTE) SARS-CoV-2 target nucleic acids are DETECTED.  SARS-CoV-2 RNA is generally detectable in upper respiratory specimens  during the acute phase of infection. Positive results are indicative of the presence of the identified virus, but do not rule out bacterial infection or co-infection with other pathogens not detected by the test. Clinical correlation with patient history and other diagnostic information is necessary to determine patient infection status. The expected result is Negative.  Fact Sheet for Patients:  PinkCheek.be  Fact Sheet for Healthcare Providers: GravelBags.it  This test is not yet approved or cleared by the [place] FDA and  has been  authorized for detection and/or diagnosis of SARS-CoV-2 by FDA under an Emergency Use Authorization (EUA).  This EUA will remain in effect (meaning this test can b e used) for the duration of  the COVID-19 declaration under Section 564(b)(1) of the Act, 21 U.S.C. section 360bbb-3(b)(1), unless the authorization is terminated or revoked sooner.      Influenza A by PCR NEGATIVE NEGATIVE Final   Influenza B by PCR NEGATIVE NEGATIVE Final    Comment: (NOTE) The Xpert Xpress SARS-CoV-2/FLU/RSV assay is intended as an aid in  the diagnosis of influenza from Nasopharyngeal swab specimens and  should not be used as a sole basis for treatment. Nasal washings and  aspirates are unacceptable for Xpert Xpress SARS-CoV-2/FLU/RSV  testing.  Fact Sheet for Patients: PinkCheek.be  Fact Sheet for Healthcare Providers: GravelBags.it  This test is not yet approved or cleared by the [place] FDA and  has been authorized for detection and/or diagnosis of SARS-CoV-2 by  FDA under an Emergency Use Authorization (EUA). This EUA will remain  in effect (meaning this test can be used) for the duration of the  Covid-19 declaration under Section 564(b)(1) of the Act, 21  U.S.C. section 360bbb-3(b)(1), unless the authorization is  terminated or revoked. Performed at [facility], [place] [address] [place], [place] [address]   Blood Culture (routine x 2)     Status: None (Preliminary result)   Collection Time: 06/28/20 12:11 PM  Specimen: BLOOD RIGHT WRIST  Result Value Ref Range Status   Specimen Description BLOOD RIGHT WRIST  Final   Special Requests   Final    BOTTLES DRAWN AEROBIC AND ANAEROBIC Blood Culture results may not be optimal due to an inadequate volume of blood received in culture bottles   Culture   Final    NO GROWTH < 24 HOURS Performed at [facility], [address], [place], [place] [address]    Report Status  PENDING  Incomplete  Blood Culture (routine x 2)     Status: None (Preliminary result)   Collection Time: 06/28/20 12:11 PM   Specimen: BLOOD RIGHT FOREARM  Result Value Ref Range Status   Specimen Description BLOOD RIGHT FOREARM  Final   Special Requests   Final    BOTTLES DRAWN AEROBIC AND ANAEROBIC Blood Culture results may not be optimal due to an inadequate volume of blood received in culture bottles   Culture   Final    NO GROWTH < 24 HOURS Performed at [facility], [place] [address], [place],  [address]    Report Status PENDING  Incomplete    Radiology Reports DG Chest Port 1 View  Result Date: 06/29/2020 CLINICAL DATA:  Shortness of breath.  Coronavirus infection. EXAM: PORTABLE CHEST 1 VIEW COMPARISON:  06/28/2020 FINDINGS: Heart and mediastinal shadows remain normal. Widespread patchy pulmonary infiltrates persist consistent with viral pneumonia. No dense consolidation or lobar collapse. No visible effusion. IMPRESSION: Persistent widespread patchy pulmonary infiltrates consistent with viral pneumonia. The pattern is somewhat more nodular than often seen. If this does not resolve in a few days, chest CT could be considered to exclude nodules. Electronically Signed   By: Nelson Chimes M.D.   On: 06/29/2020 09:15   DG Chest Port 1 View  Result Date: 06/28/2020 CLINICAL DATA:  Coronavirus infection with worsening shortness of breath and oxygen requirement. EXAM: PORTABLE CHEST 1 VIEW COMPARISON:  06/20/2020 FINDINGS: Heart and mediastinal shadows remain normal. Left system stent. Worsening of widespread bilateral hazy and patchy pulmonary infiltrates consistent with worsened viral pneumonia. No lobar consolidation or collapse. No effusion. No significant bone finding. IMPRESSION: Worsening of widespread bilateral hazy and patchy pulmonary infiltrates consistent with worsened viral pneumonia. No lobar consolidation or collapse. Electronically Signed   By: Nelson Chimes M.D.    On: 06/28/2020 11:43   DG Chest Portable 1 View  Result Date: 06/20/2020 CLINICAL DATA:  Productive cough EXAM: PORTABLE CHEST 1 VIEW COMPARISON:  Radiograph 09/21/2017 FINDINGS: Heterogeneous patchy and streaky opacities are present in both lungs with a slight basilar and peripheral predominance. No pneumothorax or visible effusion. Atherosclerotic calcification of the coronary arteries and aorta. Cardiomediastinal contours are otherwise unremarkable. No acute osseous or soft tissue abnormality. Degenerative changes are present in the imaged spine and shoulders. IMPRESSION: Heterogeneous patchy and streaky opacities in both lungs with a slight basilar and peripheral predominance. Findings are worrisome for multifocal pneumonia including potential viral etiologies. Electronically Signed   By: Lovena Le M.D.   On: 06/20/2020 19:28

## 2020-06-30 ENCOUNTER — Inpatient Hospital Stay (HOSPITAL_COMMUNITY): Payer: Medicare Other

## 2020-06-30 LAB — CBC WITH DIFFERENTIAL/PLATELET
Abs Immature Granulocytes: 0.08 10*3/uL — ABNORMAL HIGH (ref 0.00–0.07)
Basophils Absolute: 0 10*3/uL (ref 0.0–0.1)
Basophils Relative: 0 %
Eosinophils Absolute: 0 10*3/uL (ref 0.0–0.5)
Eosinophils Relative: 0 %
HCT: 34.1 % — ABNORMAL LOW (ref 39.0–52.0)
Hemoglobin: 11.3 g/dL — ABNORMAL LOW (ref 13.0–17.0)
Immature Granulocytes: 1 %
Lymphocytes Relative: 13 %
Lymphs Abs: 1.1 10*3/uL (ref 0.7–4.0)
MCH: 28.4 pg (ref 26.0–34.0)
MCHC: 33.1 g/dL (ref 30.0–36.0)
MCV: 85.7 fL (ref 80.0–100.0)
Monocytes Absolute: 0.7 10*3/uL (ref 0.1–1.0)
Monocytes Relative: 8 %
Neutro Abs: 6.8 10*3/uL (ref 1.7–7.7)
Neutrophils Relative %: 78 %
Platelets: 369 10*3/uL (ref 150–400)
RBC: 3.98 MIL/uL — ABNORMAL LOW (ref 4.22–5.81)
RDW: 12.7 % (ref 11.5–15.5)
WBC: 8.7 10*3/uL (ref 4.0–10.5)
nRBC: 0 % (ref 0.0–0.2)

## 2020-06-30 LAB — URINALYSIS, ROUTINE W REFLEX MICROSCOPIC
Bilirubin Urine: NEGATIVE
Glucose, UA: 150 mg/dL — AB
Hgb urine dipstick: NEGATIVE
Ketones, ur: NEGATIVE mg/dL
Leukocytes,Ua: NEGATIVE
Nitrite: NEGATIVE
Protein, ur: 100 mg/dL — AB
Specific Gravity, Urine: 1.018 (ref 1.005–1.030)
pH: 5 (ref 5.0–8.0)

## 2020-06-30 LAB — COMPREHENSIVE METABOLIC PANEL
ALT: 34 U/L (ref 0–44)
AST: 37 U/L (ref 15–41)
Albumin: 2.3 g/dL — ABNORMAL LOW (ref 3.5–5.0)
Alkaline Phosphatase: 94 U/L (ref 38–126)
Anion gap: 11 (ref 5–15)
BUN: 51 mg/dL — ABNORMAL HIGH (ref 8–23)
CO2: 25 mmol/L (ref 22–32)
Calcium: 8.4 mg/dL — ABNORMAL LOW (ref 8.9–10.3)
Chloride: 100 mmol/L (ref 98–111)
Creatinine, Ser: 1.67 mg/dL — ABNORMAL HIGH (ref 0.61–1.24)
GFR, Estimated: 46 mL/min — ABNORMAL LOW (ref >60)
Glucose, Bld: 156 mg/dL — ABNORMAL HIGH (ref 70–99)
Potassium: 4.2 mmol/L (ref 3.5–5.1)
Sodium: 136 mmol/L (ref 135–145)
Total Bilirubin: 0.8 mg/dL (ref 0.3–1.2)
Total Protein: 6.9 g/dL (ref 6.5–8.1)

## 2020-06-30 LAB — GLUCOSE, CAPILLARY
Glucose-Capillary: 209 mg/dL — ABNORMAL HIGH (ref 70–99)
Glucose-Capillary: 267 mg/dL — ABNORMAL HIGH (ref 70–99)
Glucose-Capillary: 288 mg/dL — ABNORMAL HIGH (ref 70–99)
Glucose-Capillary: 273 mg/dL — ABNORMAL HIGH (ref 70–99)

## 2020-06-30 LAB — CREATININE, URINE, RANDOM: Creatinine, Urine: 97.16 mg/dL

## 2020-06-30 LAB — PROCALCITONIN: Procalcitonin: 1.24 ng/mL

## 2020-06-30 LAB — D-DIMER, QUANTITATIVE: D-Dimer, Quant: 0.92 ug/mL-FEU — ABNORMAL HIGH (ref 0.00–0.50)

## 2020-06-30 LAB — MAGNESIUM: Magnesium: 2.9 mg/dL — ABNORMAL HIGH (ref 1.7–2.4)

## 2020-06-30 LAB — C-REACTIVE PROTEIN: CRP: 19.2 mg/dL — ABNORMAL HIGH (ref <1.0)

## 2020-06-30 LAB — SODIUM, URINE, RANDOM: Sodium, Ur: <10 mmol/L

## 2020-06-30 LAB — OSMOLALITY, URINE: Osmolality, Ur: 561 mOsm/kg (ref 300–900)

## 2020-06-30 LAB — BRAIN NATRIURETIC PEPTIDE: B Natriuretic Peptide: 48.7 pg/mL (ref 0.0–100.0)

## 2020-06-30 MED ORDER — LOPERAMIDE HCL 2 MG PO CAPS
4.0000 mg | ORAL_CAPSULE | ORAL | Status: DC | PRN
  Administered 2020-06-30 – 2020-07-01 (×4): 4 mg via ORAL
  Filled 2020-06-30 (×4): qty 2

## 2020-06-30 MED ORDER — IPRATROPIUM-ALBUTEROL 20-100 MCG/ACT IN AERS: 1.0000 | INHALATION_SPRAY | Freq: Four times a day (QID) | RESPIRATORY_TRACT | Status: DC | PRN

## 2020-06-30 MED ORDER — BARICITINIB 2 MG PO TABS
2.0000 mg | ORAL_TABLET | Freq: Every day | ORAL | Status: DC
  Administered 2020-07-01 – 2020-07-02 (×2): 2 mg via ORAL
  Filled 2020-06-30 (×2): qty 1

## 2020-06-30 NOTE — Plan of Care (Signed)
  Problem: Education: Goal: Knowledge of risk factors and measures for prevention of condition will improve Outcome: Progressing   Problem: Coping: Goal: Psychosocial and spiritual needs will be supported Outcome: Progressing   Problem: Respiratory: Goal: Will maintain a patent airway Outcome: Progressing Goal: Complications related to the disease process, condition or treatment will be avoided or minimized Outcome: Progressing   

## 2020-06-30 NOTE — Progress Notes (Signed)
PROGRESS NOTE                                                                                                                                                                                                             Patient Demographics:    Derek Lowery, is a 64 y.o. male, DOB - 02/05/1956, IPP:[id number]  Outpatient Primary MD for the patient is Ladell Pier, MD    LOS - 2  Admit date - 06/28/2020    Chief Complaint  Patient presents with  . Covid Positive       Brief Narrative (HPI from H&P) Derek Lowery is a 64 y.o. male with medical history significant of ischemic heart disease, coronary artery disease, hypertension, dyslipidemia, type 2 diabetes mellitus and glaucoma who was diagnosed with SARS COVID-19 November 3, resented to the hospital with shortness of breath and was diagnosed with acute hypoxic respiratory failure due to COVID-19 pneumonia along with mild gastroenteritis from COVID-19.   Subjective:   Patient in bed, appears comfortable, denies any headache, no fever, no chest pain or pressure, no shortness of breath , improved abdominal pain. No focal weakness.   Assessment  & Plan :     1. Acute Hypoxic Resp. Failure due to Acute Covid 19 Viral Pneumonitis during the ongoing 2020 Covid 19 Pandemic - he unfortunately unvaccinated and has incurred moderate to severe parenchymal lung injury, being treated with IV steroids, Remdesivir and Baricitinib.  Ischial positive date 06/20/2020.  Encouraged the patient to sit up in chair in the daytime use I-S and flutter valve for pulmonary toiletry and then prone in bed when at night.  Will advance activity and titrate down oxygen as possible.    SpO2: (!) 87 % O2 Flow Rate (L/min): 5 L/min  Recent Labs  Lab 06/28/20 1211 06/29/20 0043 06/29/20 0800 06/30/20 0133  WBC 7.7  --   --  8.7  HGB 10.7*  --   --  11.3*  HCT 31.9*  --   --  34.1*  PLT 233   --   --  369  CRP 23.1* 25.9*  --  19.2*  BNP 39.4  --  111.3* 48.7  DDIMER 1.85* 1.40*  --  0.92*  PROCALCITON 0.80  --  1.48 1.24  AST 43* 35  --  37  ALT 40 34  --  34  ALKPHOS 100 97  --  94  BILITOT 1.4* 1.2  --  0.8  ALBUMIN 2.2* 2.0*  --  2.3*  LATICACIDVEN 1.0  --   --   --     2.  Mild elevation of D-dimer likely due to inflammation from COVID-19, on moderate dose Lovenox, negative leg ultrasound.   3.  AKI.  Pending urine electrolytes, has been hydrated with IV fluids, will continue gentle hydration, follow urine electrolytes and check renal ultrasound as well.  4.  COVID-19 and used gastroenteritis.  Improved.    5. CAD.  On aspirin, statin, symptom-free.  6.  DM type II.  On Lantus and sliding scale.  Dose adjusted 06/29/2020 to account for steroid use.  Lab Results  Component Value Date   HGBA1C 10.9 (H) 06/28/2020   CBG (last 3)  Recent Labs    06/29/20 1744 06/29/20 2102 06/30/20 0754  GLUCAP 244* 223* 209*      Condition - Extremely Guarded  Family Communication  :    Phone numbers listed for Lucas Mallow [phone] - Wrong on 06/29/2020 at 10:06 AM  brother Joneen Caraway [phone].  Call cannot be completed message on 06/30/2019 2110 7 AM   Code Status :  Full  Consults  :  None  Procedures  :    Leg [place] - No DVT  PUD Prophylaxis : PPI  Disposition Plan  :    Status is: Inpatient  Remains inpatient appropriate because:IV treatments appropriate due to intensity of illness or inability to take PO   Dispo: The patient is from: Home              Anticipated d/c is to: Home              Anticipated d/c date is: > 3 days              Patient currently is not medically stable to d/c.   DVT Prophylaxis  :  Lovenox   Lab Results  Component Value Date   PLT 369 06/30/2020    Diet :  Diet Order            Diet heart healthy/carb modified Room service appropriate? Yes; Fluid consistency: Thin  Diet effective now                    Inpatient Medications  Scheduled Meds: . aspirin EC  81 mg Oral Daily  . atorvastatin  40 mg Oral Daily  . baricitinib  4 mg Oral Daily  . brimonidine  1 drop Right Eye BID  . chlorpheniramine-HYDROcodone  5 mL Oral Q12H  . dorzolamide  1 drop Right Eye BID  . enoxaparin (LOVENOX) injection  40 mg Subcutaneous Q12H  . insulin aspart  0-15 Units Subcutaneous TID WC  . insulin aspart  0-5 Units Subcutaneous QHS  . insulin aspart  4 Units Subcutaneous TID WC  . insulin glargine  20 Units Subcutaneous BID  . Ipratropium-Albuterol  1 puff Inhalation Q6H  . latanoprost  1 drop Right Eye QHS  . Latanoprostene Bunod  1 drop Right Eye QHS  . methylPREDNISolone (SOLU-MEDROL) injection  60 mg Intravenous Q12H  . multivitamin with minerals  1 tablet Oral Daily  . Netarsudil Dimesylate  1 drop Right Eye QHS  . timolol  1 drop Right Eye BID   Continuous Infusions: . remdesivir 100 mg in NS 100 mL 100 mg (06/30/20 0947)   PRN Meds:.acetaminophen **OR** [DISCONTINUED] acetaminophen, guaiFENesin-dextromethorphan,  loperamide, [DISCONTINUED] ondansetron **OR** ondansetron (ZOFRAN) IV  Antibiotics  :    Anti-infectives (From admission, onward)   Start     Dose/Rate Route Frequency Ordered Stop   06/29/20 1000  remdesivir 100 mg in sodium chloride 0.9 % 100 mL IVPB  Status:  Discontinued       "Followed by" Linked Group Details   100 mg 200 mL/hr over 30 Minutes Intravenous Daily 06/28/20 1528 06/28/20 1652   06/29/20 1000  remdesivir 100 mg in sodium chloride 0.9 % 100 mL IVPB       "Followed by" Linked Group Details   100 mg 200 mL/hr over 30 Minutes Intravenous Every 24 hours 06/28/20 1521 07/03/20 0959   06/28/20 1600  remdesivir 200 mg in sodium chloride 0.9% 250 mL IVPB       "Followed by" Linked Group Details   200 mg 580 mL/hr over 30 Minutes Intravenous Once 06/28/20 1521 06/28/20 1726   06/28/20 1530  remdesivir 200 mg in sodium chloride 0.9% 250 mL IVPB  Status:  Discontinued        "Followed by" Linked Group Details   200 mg 580 mL/hr over 30 Minutes Intravenous Once 06/28/20 1528 06/28/20 1652       Time Spent in minutes  30   Lala Lund M.D on 06/30/2020 at 10:50 AM  To page go to [email or web address] - password [facility]  [facility] -  Office  [phone]     See all Orders from today for further details    Objective:   Vitals:   06/29/20 2000 06/30/20 0029 06/30/20 0358 06/30/20 0749  BP:  (!) 162/101 (!) 141/73 132/72  Pulse: 80 94 67 70  Resp: 12 (!) 22 16 14   Temp:  98.1 F (36.7 C) 97.8 F (36.6 C) 97.6 F (36.4 C)  TempSrc:  Oral Oral Oral  SpO2: 98% (!) 88% (!) 89% (!) 87%  Weight:      Height:        Wt Readings from Last 3 Encounters:  06/28/20 107.6 kg  01/13/20 111.8 kg  10/17/19 110.7 kg     Intake/Output Summary (Last 24 hours) at 06/30/2020 1050 Last data filed at 06/29/2020 1500 Gross per 24 hour  Intake 31.98 ml  Output --  Net 31.98 ml     Physical Exam  Awake Alert, No new F.N deficits, Normal affect [place].AT,PERRAL Supple Neck,No JVD, No cervical lymphadenopathy appriciated.  Symmetrical Chest wall movement, Good air movement bilaterally, CTAB RRR,No Gallops, Rubs or new Murmurs, No Parasternal Heave +ve B.Sounds, Abd Soft, No tenderness, No organomegaly appriciated, No rebound - guarding or rigidity. No Cyanosis, Clubbing or edema, No new Rash or bruise     Data Review:    CBC Recent Labs  Lab 06/28/20 1211 06/30/20 0133  WBC 7.7 8.7  HGB 10.7* 11.3*  HCT 31.9* 34.1*  PLT 233 369  MCV 86.4 85.7  MCH 29.0 28.4  MCHC 33.5 33.1  RDW 12.8 12.7  LYMPHSABS 0.3* 1.1  MONOABS 0.7 0.7  EOSABS 0.2 0.0  BASOSABS 0.0 0.0    Recent Labs  Lab 06/28/20 1211 06/28/20 1649 06/29/20 0043 06/29/20 0800 06/30/20 0133  NA 131*  --  131*  --  136  K 3.7  --  4.0  --  4.2  CL 96*  --  99  --  100  CO2 24  --  19*  --  25  GLUCOSE 129*  --  316*  --  156*  BUN 35*  --  39*  --  51*  CREATININE  1.77*  --  1.67*  --  1.67*  CALCIUM 7.6*  --  7.8*  --  8.4*  AST 43*  --  35  --  37  ALT 40  --  34  --  34  ALKPHOS 100  --  97  --  94  BILITOT 1.4*  --  1.2  --  0.8  ALBUMIN 2.2*  --  2.0*  --  2.3*  MG  --   --   --   --  2.9*  CRP 23.1*  --  25.9*  --  19.2*  DDIMER 1.85*  --  1.40*  --  0.92*  PROCALCITON 0.80  --   --  1.48 1.24  LATICACIDVEN 1.0  --   --   --   --   HGBA1C  --  10.9*  --   --   --   BNP 39.4  --   --  111.3* 48.7    ------------------------------------------------------------------------------------------------------------------ Recent Labs    06/28/20 1211  TRIG 75    Lab Results  Component Value Date   HGBA1C 10.9 (H) 06/28/2020   ------------------------------------------------------------------------------------------------------------------ No results for input(s): TSH, T4TOTAL, T3FREE, THYROIDAB in the last 72 hours.  Invalid input(s): FREET3  Cardiac Enzymes No results for input(s): CKMB, TROPONINI, MYOGLOBIN in the last 168 hours.  Invalid input(s): CK ------------------------------------------------------------------------------------------------------------------    Component Value Date/Time   BNP 48.7 06/30/2020 0133    Micro Results Recent Results (from the past 240 hour(s))  Respiratory Panel by RT PCR (Flu A&B, Covid) - Nasopharyngeal Swab     Status: Abnormal   Collection Time: 06/20/20  7:38 PM   Specimen: Nasopharyngeal Swab  Result Value Ref Range Status   SARS Coronavirus 2 by RT PCR POSITIVE (A) NEGATIVE Final    Comment: RESULT CALLED TO, READ BACK BY AND VERIFIED WITH: A COLEMAN RN 2105 06/20/20 A BROWNING (NOTE) SARS-CoV-2 target nucleic acids are DETECTED.  SARS-CoV-2 RNA is generally detectable in upper respiratory specimens  during the acute phase of infection. Positive results are indicative of the presence of the identified virus, but do not rule out bacterial infection or co-infection with other  pathogens not detected by the test. Clinical correlation with patient history and other diagnostic information is necessary to determine patient infection status. The expected result is Negative.  Fact Sheet for Patients:  PinkCheek.be  Fact Sheet for Healthcare Providers: GravelBags.it  This test is not yet approved or cleared by the [place] FDA and  has been authorized for detection and/or diagnosis of SARS-CoV-2 by FDA under an Emergency Use Authorization (EUA).  This EUA will remain in effect (meaning this test can b e used) for the duration of  the COVID-19 declaration under Section 564(b)(1) of the Act, 21 U.S.C. section 360bbb-3(b)(1), unless the authorization is terminated or revoked sooner.      Influenza A by PCR NEGATIVE NEGATIVE Final   Influenza B by PCR NEGATIVE NEGATIVE Final    Comment: (NOTE) The Xpert Xpress SARS-CoV-2/FLU/RSV assay is intended as an aid in  the diagnosis of influenza from Nasopharyngeal swab specimens and  should not be used as a sole basis for treatment. Nasal washings and  aspirates are unacceptable for Xpert Xpress SARS-CoV-2/FLU/RSV  testing.  Fact Sheet for Patients: PinkCheek.be  Fact Sheet for Healthcare Providers: GravelBags.it  This test is not yet approved or cleared by the [place] FDA and  has been authorized for detection  and/or diagnosis of SARS-CoV-2 by  FDA under an Emergency Use Authorization (EUA). This EUA will remain  in effect (meaning this test can be used) for the duration of the  Covid-19 declaration under Section 564(b)(1) of the Act, 21  U.S.C. section 360bbb-3(b)(1), unless the authorization is  terminated or revoked. Performed at [facility], [place] [address] [place], [place] [address]   Blood Culture (routine x 2)     Status: None (Preliminary result)   Collection Time:  06/28/20 12:11 PM   Specimen: BLOOD RIGHT WRIST  Result Value Ref Range Status   Specimen Description BLOOD RIGHT WRIST  Final   Special Requests   Final    BOTTLES DRAWN AEROBIC AND ANAEROBIC Blood Culture results may not be optimal due to an inadequate volume of blood received in culture bottles   Culture   Final    NO GROWTH 2 DAYS Performed at [facility], [place] [address], [place], [place] [address]    Report Status PENDING  Incomplete  Blood Culture (routine x 2)     Status: None (Preliminary result)   Collection Time: 06/28/20 12:11 PM   Specimen: BLOOD RIGHT FOREARM  Result Value Ref Range Status   Specimen Description BLOOD RIGHT FOREARM  Final   Special Requests   Final    BOTTLES DRAWN AEROBIC AND ANAEROBIC Blood Culture results may not be optimal due to an inadequate volume of blood received in culture bottles   Culture   Final    NO GROWTH 2 DAYS Performed at [facility], [place] [address], [place], [place] [address]    Report Status PENDING  Incomplete    Radiology Reports DG Chest Port 1 View  Result Date: 06/29/2020 CLINICAL DATA:  Shortness of breath.  Coronavirus infection. EXAM: PORTABLE CHEST 1 VIEW COMPARISON:  06/28/2020 FINDINGS: Heart and mediastinal shadows remain normal. Widespread patchy pulmonary infiltrates persist consistent with viral pneumonia. No dense consolidation or lobar collapse. No visible effusion. IMPRESSION: Persistent widespread patchy pulmonary infiltrates consistent with viral pneumonia. The pattern is somewhat more nodular than often seen. If this does not resolve in a few days, chest CT could be considered to exclude nodules. Electronically Signed   By: Nelson Chimes M.D.   On: 06/29/2020 09:15   DG Chest Port 1 View  Result Date: 06/28/2020 CLINICAL DATA:  Coronavirus infection with worsening shortness of breath and oxygen requirement. EXAM: PORTABLE CHEST 1 VIEW COMPARISON:  06/20/2020 FINDINGS: Heart and mediastinal shadows  remain normal. Left system stent. Worsening of widespread bilateral hazy and patchy pulmonary infiltrates consistent with worsened viral pneumonia. No lobar consolidation or collapse. No effusion. No significant bone finding. IMPRESSION: Worsening of widespread bilateral hazy and patchy pulmonary infiltrates consistent with worsened viral pneumonia. No lobar consolidation or collapse. Electronically Signed   By: Nelson Chimes M.D.   On: 06/28/2020 11:43   DG Chest Portable 1 View  Result Date: 06/20/2020 CLINICAL DATA:  Productive cough EXAM: PORTABLE CHEST 1 VIEW COMPARISON:  Radiograph 09/21/2017 FINDINGS: Heterogeneous patchy and streaky opacities are present in both lungs with a slight basilar and peripheral predominance. No pneumothorax or visible effusion. Atherosclerotic calcification of the coronary arteries and aorta. Cardiomediastinal contours are otherwise unremarkable. No acute osseous or soft tissue abnormality. Degenerative changes are present in the imaged spine and shoulders. IMPRESSION: Heterogeneous patchy and streaky opacities in both lungs with a slight basilar and peripheral predominance. Findings are worrisome for multifocal pneumonia including potential viral etiologies. Electronically Signed  By: Lovena Le M.D.   On: 06/20/2020 19:28   VAS [place] LOWER EXTREMITY VENOUS (DVT)  Result Date: 06/29/2020  Lower Venous DVT Study Indications: Pain, and Swelling.  Risk Factors: Elevated D Dimer. Performing Technologist: Griffin Basil RCT RDMS  Examination Guidelines: A complete evaluation includes B-mode imaging, spectral Doppler, color Doppler, and power Doppler as needed of all accessible portions of each vessel. Bilateral testing is considered an integral part of a complete examination. Limited examinations for reoccurring indications may be performed as noted. The reflux portion of the exam is performed with the patient in reverse Trendelenburg.   +---------+---------------+---------+-----------+----------+--------------+ RIGHT    CompressibilityPhasicitySpontaneityPropertiesThrombus Aging +---------+---------------+---------+-----------+----------+--------------+ CFV      Full           Yes      Yes                                 +---------+---------------+---------+-----------+----------+--------------+ SFJ      Full                                                        +---------+---------------+---------+-----------+----------+--------------+ FV Prox  Full                                                        +---------+---------------+---------+-----------+----------+--------------+ FV Mid   Full                                                        +---------+---------------+---------+-----------+----------+--------------+ FV DistalFull                                                        +---------+---------------+---------+-----------+----------+--------------+ PFV      Full                                                        +---------+---------------+---------+-----------+----------+--------------+ POP      Full           Yes      Yes                                 +---------+---------------+---------+-----------+----------+--------------+ PTV      Full                                                        +---------+---------------+---------+-----------+----------+--------------+ PERO  Full                                                        +---------+---------------+---------+-----------+----------+--------------+   +---------+---------------+---------+-----------+----------+--------------+ LEFT     CompressibilityPhasicitySpontaneityPropertiesThrombus Aging +---------+---------------+---------+-----------+----------+--------------+ CFV      Full           Yes      Yes                                  +---------+---------------+---------+-----------+----------+--------------+ SFJ      Full                                                        +---------+---------------+---------+-----------+----------+--------------+ FV Prox  Full                                                        +---------+---------------+---------+-----------+----------+--------------+ FV Mid   Full                                                        +---------+---------------+---------+-----------+----------+--------------+ FV DistalFull                                                        +---------+---------------+---------+-----------+----------+--------------+ PFV      Full                                                        +---------+---------------+---------+-----------+----------+--------------+ POP      Full           Yes      Yes                                 +---------+---------------+---------+-----------+----------+--------------+ PTV      Full                                                        +---------+---------------+---------+-----------+----------+--------------+ PERO     Full                                                        +---------+---------------+---------+-----------+----------+--------------+  Summary: RIGHT: - There is no evidence of deep vein thrombosis in the lower extremity.  - No cystic structure found in the popliteal fossa.  LEFT: - There is no evidence of deep vein thrombosis in the lower extremity.  - No cystic structure found in the popliteal fossa.  *See table(s) above for measurements and observations.    Preliminary

## 2020-06-30 NOTE — Progress Notes (Signed)
Physical Therapy Treatment Patient Details Name: Derek Lowery MRN: [id number] DOB: [birth year] Today's Date: 06/30/2020    History of Present Illness 64 y.o. male with medical history significant of ischemic heart disease, coronary artery disease, hypertension, dyslipidemia, type 2 diabetes mellitus and glaucoma who was diagnosed with SARS COVID-19 November 3, He presented to the hospital 11/11 with shortness of breath and was diagnosed with acute hypoxic respiratory failure due to COVID-19 pneumonia along with mild gastroenteritis from COVID-19.    PT Comments    Pt received in recliner. He required min guard assist transfers and ambulation 25' x 2 with RW. SpO2 96% at rest on 5L. Amb 1st trial of 25' on 6L with desat to 75%. 3-5 minute seated rest to recover to 90%. Amb 2nd trial 25' on 8L with desat to 80%. Quick recovery to 90% upon sitting in recliner. Pt in recliner at end of session on 5L with SpO2 96%.     Follow Up Recommendations  Home health PT;Supervision for mobility/OOB     Equipment Recommendations  None recommended by PT    Recommendations for Other Services       Precautions / Restrictions Precautions Precautions: Fall Precaution Comments: baseline tremor    Mobility  Bed Mobility               General bed mobility comments: pt up in chair, returned to chair  Transfers Overall transfer level: Needs assistance Equipment used: Rolling walker (2 wheeled) Transfers: Sit to/from Stand Sit to Stand: Min guard         General transfer comment: increased time and effort  Ambulation/Gait Ambulation/Gait assistance: Min guard Gait Distance (Feet): 25 Feet (x 2) Assistive device: Rolling walker (2 wheeled) Gait Pattern/deviations: Step-through pattern;Decreased stride length Gait velocity: decreased Gait velocity interpretation: <1.31 ft/sec, indicative of household ambulator General Gait Details: SpO2 96% at rest on 5L. Amb on 6L first gait trial of  25' with desat to 75%. 3-5 minute seated rest break to recover to 90%. Amb 2nd trial of 25' on 8L with desat to 80%. Quick recovery to 90% after sitting in recliner.   Stairs             Wheelchair Mobility    Modified Rankin (Stroke Patients Only)       Balance Overall balance assessment: Needs assistance Sitting-balance support: No upper extremity supported;Feet supported Sitting balance-Leahy Scale: Good     Standing balance support: No upper extremity supported;Bilateral upper extremity supported;During functional activity Standing balance-Leahy Scale: Fair Standing balance comment: static stand without UE support, RW for amb                            Cognition Arousal/Alertness: Awake/alert Behavior During Therapy: WFL for tasks assessed/performed Overall Cognitive Status: No family/caregiver present to determine baseline cognitive functioning Area of Impairment: Safety/judgement;Problem solving                         Safety/Judgement: Decreased awareness of deficits   Problem Solving: Slow processing;Requires verbal cues        Exercises      General Comments General comments (skin integrity, edema, etc.): 5L on arrival, SpO2 96%. Amb 25' on 6L, desat to 75%. 3-5 min seated rest to recover to 90%. Amb 25' 8L 2nd trial, desat to 80%. Quick recovery to 90% upon sitting in recliner. Returned to 5L at end of session with SpO2 96%.  Pertinent Vitals/Pain Pain Assessment: No/denies pain    Home Living                      Prior Function            PT Goals (current goals can now be found in the care plan section) Acute Rehab PT Goals Patient Stated Goal: home Progress towards PT goals: Progressing toward goals    Frequency    Min 3X/week      PT Plan Current plan remains appropriate    Co-evaluation              AM-PAC PT "6 Clicks" Mobility   Outcome Measure  Help needed turning from your back to  your side while in a flat bed without using bedrails?: A Little Help needed moving from lying on your back to sitting on the side of a flat bed without using bedrails?: A Little Help needed moving to and from a bed to a chair (including a wheelchair)?: A Little Help needed standing up from a chair using your arms (e.g., wheelchair or bedside chair)?: A Little Help needed to walk in hospital room?: A Little Help needed climbing 3-5 steps with a railing? : A Lot 6 Click Score: 17    End of Session Equipment Utilized During Treatment: Oxygen Activity Tolerance: Patient tolerated treatment well Patient left: in chair;with call bell/phone within reach Nurse Communication: Mobility status PT Visit Diagnosis: Other abnormalities of gait and mobility (R26.89);Difficulty in walking, not elsewhere classified (R26.2);Muscle weakness (generalized) (M62.81)     Time: 2111-5520 PT Time Calculation (min) (ACUTE ONLY): 25 min  Charges:  $Gait Training: 23-37 mins                     Derek Lowery, [place]  Office # [phone] Pager [phone]    [name] 06/30/2020, 2:14 PM

## 2020-07-01 LAB — CBC WITH DIFFERENTIAL/PLATELET
Abs Immature Granulocytes: 0.09 10*3/uL — ABNORMAL HIGH (ref 0.00–0.07)
Basophils Absolute: 0 10*3/uL (ref 0.0–0.1)
Basophils Relative: 0 %
Eosinophils Absolute: 0 10*3/uL (ref 0.0–0.5)
Eosinophils Relative: 0 %
HCT: 33.5 % — ABNORMAL LOW (ref 39.0–52.0)
Hemoglobin: 11.1 g/dL — ABNORMAL LOW (ref 13.0–17.0)
Immature Granulocytes: 1 %
Lymphocytes Relative: 13 %
Lymphs Abs: 1 10*3/uL (ref 0.7–4.0)
MCH: 28.6 pg (ref 26.0–34.0)
MCHC: 33.1 g/dL (ref 30.0–36.0)
MCV: 86.3 fL (ref 80.0–100.0)
Monocytes Absolute: 0.7 10*3/uL (ref 0.1–1.0)
Monocytes Relative: 9 %
Neutro Abs: 5.9 10*3/uL (ref 1.7–7.7)
Neutrophils Relative %: 77 %
Platelets: 380 10*3/uL (ref 150–400)
RBC: 3.88 MIL/uL — ABNORMAL LOW (ref 4.22–5.81)
RDW: 13 % (ref 11.5–15.5)
WBC: 7.8 10*3/uL (ref 4.0–10.5)
nRBC: 0 % (ref 0.0–0.2)

## 2020-07-01 LAB — GLUCOSE, CAPILLARY
Glucose-Capillary: 239 mg/dL — ABNORMAL HIGH (ref 70–99)
Glucose-Capillary: 245 mg/dL — ABNORMAL HIGH (ref 70–99)
Glucose-Capillary: 241 mg/dL — ABNORMAL HIGH (ref 70–99)
Glucose-Capillary: 137 mg/dL — ABNORMAL HIGH (ref 70–99)

## 2020-07-01 LAB — MAGNESIUM: Magnesium: 3.1 mg/dL — ABNORMAL HIGH (ref 1.7–2.4)

## 2020-07-01 LAB — COMPREHENSIVE METABOLIC PANEL
ALT: 34 U/L (ref 0–44)
AST: 33 U/L (ref 15–41)
Albumin: 2.3 g/dL — ABNORMAL LOW (ref 3.5–5.0)
Alkaline Phosphatase: 99 U/L (ref 38–126)
Anion gap: 10 (ref 5–15)
BUN: 58 mg/dL — ABNORMAL HIGH (ref 8–23)
CO2: 25 mmol/L (ref 22–32)
Calcium: 8.5 mg/dL — ABNORMAL LOW (ref 8.9–10.3)
Chloride: 100 mmol/L (ref 98–111)
Creatinine, Ser: 1.82 mg/dL — ABNORMAL HIGH (ref 0.61–1.24)
GFR, Estimated: 41 mL/min — ABNORMAL LOW (ref >60)
Glucose, Bld: 248 mg/dL — ABNORMAL HIGH (ref 70–99)
Potassium: 3.9 mmol/L (ref 3.5–5.1)
Sodium: 135 mmol/L (ref 135–145)
Total Bilirubin: 0.7 mg/dL (ref 0.3–1.2)
Total Protein: 6.8 g/dL (ref 6.5–8.1)

## 2020-07-01 LAB — BRAIN NATRIURETIC PEPTIDE: B Natriuretic Peptide: 89.4 pg/mL (ref 0.0–100.0)

## 2020-07-01 LAB — C-REACTIVE PROTEIN: CRP: 11.8 mg/dL — ABNORMAL HIGH (ref <1.0)

## 2020-07-01 LAB — PROCALCITONIN: Procalcitonin: 0.69 ng/mL

## 2020-07-01 LAB — D-DIMER, QUANTITATIVE: D-Dimer, Quant: 0.49 ug/mL-FEU (ref 0.00–0.50)

## 2020-07-01 MED ORDER — LACTATED RINGERS IV SOLN: INTRAVENOUS | Status: DC

## 2020-07-01 NOTE — Progress Notes (Signed)
SATURATION QUALIFICATIONS: (This note is used to comply with regulatory documentation for home oxygen)  Patient Saturations on Room Air at Rest = 92%  Patient Saturations on Room Air while Ambulating = 75%  Patient Saturations on 4 Liters of oxygen while Ambulating = 88%  Please briefly explain why patient needs home oxygen:

## 2020-07-01 NOTE — Progress Notes (Addendum)
PROGRESS NOTE                                                                                                                                                                                                             Patient Demographics:    Derek Lowery, is a 64 y.o. male, DOB - [birth year], EXN:[id number]  Outpatient Primary MD for the patient is Derek Pier, MD    LOS - 3  Admit date - 06/28/2020    Chief Complaint  Patient presents with  . Covid Positive       Brief Narrative (HPI from H&P) Derek Lowery is a 64 y.o. male with medical history significant of ischemic heart disease, coronary artery disease, hypertension, dyslipidemia, type 2 diabetes mellitus and glaucoma who was diagnosed with SARS COVID-19 November 3, resented to the hospital with shortness of breath and was diagnosed with acute hypoxic respiratory failure due to COVID-19 pneumonia along with mild gastroenteritis from COVID-19.   Subjective:   Patient in bed, appears comfortable, denies any headache, no fever, no chest pain or pressure, improved shortness of breath , no abdominal pain. No focal weakness.    Assessment  & Plan :     1. Acute Hypoxic Resp. Failure due to Acute Covid 19 Viral Pneumonitis during the ongoing 2020 Covid 19 Pandemic - he unfortunately unvaccinated and has incurred moderate to severe parenchymal lung injury, being treated with IV steroids, Remdesivir and Baricitinib. Initial positive date 06/20/2020.  Encouraged the patient to sit up in chair in the daytime use I-S and flutter valve for pulmonary toiletry and then prone in bed when at night.  Will advance activity and titrate down oxygen as possible.    SpO2: 100 % O2 Flow Rate (L/min): 3 L/min  Recent Labs  Lab 06/28/20 1211 06/29/20 0043 06/29/20 0800 06/30/20 0133 07/01/20 0343  WBC 7.7  --   --  8.7 7.8  HGB 10.7*  --   --  11.3* 11.1*  HCT 31.9*   --   --  34.1* 33.5*  PLT 233  --   --  369 380  CRP 23.1* 25.9*  --  19.2* 11.8*  BNP 39.4  --  111.3* 48.7 89.4  DDIMER 1.85* 1.40*  --  0.92* 0.49  PROCALCITON 0.80  --  1.48 1.24 0.69  AST 43*  35  --  37 33  ALT 40 34  --  34 34  ALKPHOS 100 97  --  94 99  BILITOT 1.4* 1.2  --  0.8 0.7  ALBUMIN 2.2* 2.0*  --  2.3* 2.3*  LATICACIDVEN 1.0  --   --   --   --     2.  Mild elevation of D-dimer likely due to inflammation from COVID-19, on moderate dose Lovenox, negative leg ultrasound.   3.  AKI.  Stable renal ultrasound, appears prerenal, hydrate with IV fluids and monitor.  4.  COVID-19 and used gastroenteritis.  Improved.    5. CAD.  On aspirin, statin, symptom-free.  6.  DM type II.  On Lantus and sliding scale.  Dose adjusted 06/29/2020 to account for steroid use.  Lab Results  Component Value Date   HGBA1C 10.9 (H) 06/28/2020   CBG (last 3)  Recent Labs    06/30/20 1714 06/30/20 2144 07/01/20 0744  GLUCAP 288* 273* 239*      Condition - Extremely Guarded  Family Communication  :    Phone numbers listed for Lucas Mallow [phone] - Wrong on 06/29/2020 at 10:06 AM  brother Joneen Caraway [phone].  Call cannot be completed message on 06/30/2019 2110 7 AM   Code Status :  Full  Consults  :  None  Procedures  :    Leg [place] - No DVT  PUD Prophylaxis : PPI  Disposition Plan  :    Status is: Inpatient  Remains inpatient appropriate because:IV treatments appropriate due to intensity of illness or inability to take PO   Dispo: The patient is from: Home              Anticipated d/c is to: Home              Anticipated d/c date is: > 3 days              Patient currently is not medically stable to d/c.   DVT Prophylaxis  :  Lovenox   Lab Results  Component Value Date   PLT 380 07/01/2020    Diet :  Diet Order            Diet heart healthy/carb modified Room service appropriate? Yes; Fluid consistency: Thin  Diet effective now                   Inpatient Medications  Scheduled Meds: . aspirin EC  81 mg Oral Daily  . atorvastatin  40 mg Oral Daily  . baricitinib  2 mg Oral Daily  . brimonidine  1 drop Right Eye BID  . chlorpheniramine-HYDROcodone  5 mL Oral Q12H  . dorzolamide  1 drop Right Eye BID  . enoxaparin (LOVENOX) injection  40 mg Subcutaneous Q12H  . insulin aspart  0-15 Units Subcutaneous TID WC  . insulin aspart  0-5 Units Subcutaneous QHS  . insulin aspart  4 Units Subcutaneous TID WC  . insulin glargine  20 Units Subcutaneous BID  . latanoprost  1 drop Right Eye QHS  . Latanoprostene Bunod  1 drop Right Eye QHS  . methylPREDNISolone (SOLU-MEDROL) injection  60 mg Intravenous Q12H  . multivitamin with minerals  1 tablet Oral Daily  . Netarsudil Dimesylate  1 drop Right Eye QHS  . timolol  1 drop Right Eye BID   Continuous Infusions: . remdesivir 100 mg in NS 100 mL 100 mg (07/01/20 0947)   PRN Meds:.acetaminophen **OR** [DISCONTINUED] acetaminophen, guaiFENesin-dextromethorphan,  Ipratropium-Albuterol, loperamide, [DISCONTINUED] ondansetron **OR** ondansetron (ZOFRAN) IV  Antibiotics  :    Anti-infectives (From admission, onward)   Start     Dose/Rate Route Frequency Ordered Stop   06/29/20 1000  remdesivir 100 mg in sodium chloride 0.9 % 100 mL IVPB  Status:  Discontinued       "Followed by" Linked Group Details   100 mg 200 mL/hr over 30 Minutes Intravenous Daily 06/28/20 1528 06/28/20 1652   06/29/20 1000  remdesivir 100 mg in sodium chloride 0.9 % 100 mL IVPB       "Followed by" Linked Group Details   100 mg 200 mL/hr over 30 Minutes Intravenous Every 24 hours 06/28/20 1521 07/03/20 0959   06/28/20 1600  remdesivir 200 mg in sodium chloride 0.9% 250 mL IVPB       "Followed by" Linked Group Details   200 mg 580 mL/hr over 30 Minutes Intravenous Once 06/28/20 1521 06/28/20 1726   06/28/20 1530  remdesivir 200 mg in sodium chloride 0.9% 250 mL IVPB  Status:  Discontinued       "Followed by" Linked  Group Details   200 mg 580 mL/hr over 30 Minutes Intravenous Once 06/28/20 1528 06/28/20 1652       Time Spent in minutes  30   Lala Lund M.D on 07/01/2020 at 10:29 AM  To page go to [email or web address] - password [facility]  [facility] -  Office  [phone]     See all Orders from today for further details    Objective:   Vitals:   06/30/20 2024 07/01/20 0001 07/01/20 0455 07/01/20 0738  BP: (!) 166/68 135/78 127/77 140/80  Pulse: 73 69 61 60  Resp: 18 14 13 20   Temp: 97.6 F (36.4 C) 97.6 F (36.4 C) 97.7 F (36.5 C)   TempSrc: Oral Oral Oral   SpO2: 95% 94% 97% 100%  Weight:      Height:        Wt Readings from Last 3 Encounters:  06/28/20 107.6 kg  01/13/20 111.8 kg  10/17/19 110.7 kg     Intake/Output Summary (Last 24 hours) at 07/01/2020 1029 Last data filed at 07/01/2020 0400 Gross per 24 hour  Intake 120 ml  Output 700 ml  Net -580 ml     Physical Exam  Awake Alert, No new F.N deficits, Normal affect [place].AT,PERRAL Supple Neck,No JVD, No cervical lymphadenopathy appriciated.  Symmetrical Chest wall movement, Good air movement bilaterally, CTAB RRR,No Gallops, Rubs or new Murmurs, No Parasternal Heave +ve B.Sounds, Abd Soft, No tenderness, No organomegaly appriciated, No rebound - guarding or rigidity. No Cyanosis, Clubbing or edema, No new Rash or bruise    Data Review:    CBC Recent Labs  Lab 06/28/20 1211 06/30/20 0133 07/01/20 0343  WBC 7.7 8.7 7.8  HGB 10.7* 11.3* 11.1*  HCT 31.9* 34.1* 33.5*  PLT 233 369 380  MCV 86.4 85.7 86.3  MCH 29.0 28.4 28.6  MCHC 33.5 33.1 33.1  RDW 12.8 12.7 13.0  LYMPHSABS 0.3* 1.1 1.0  MONOABS 0.7 0.7 0.7  EOSABS 0.2 0.0 0.0  BASOSABS 0.0 0.0 0.0    Recent Labs  Lab 06/28/20 1211 06/28/20 1649 06/29/20 0043 06/29/20 0800 06/30/20 0133 07/01/20 0343  NA 131*  --  131*  --  136 135  K 3.7  --  4.0  --  4.2 3.9  CL 96*  --  99  --  100 100  CO2 24  --  19*  --  25 25  GLUCOSE 129*   --  316*  --  156* 248*  BUN 35*  --  39*  --  51* 58*  CREATININE 1.77*  --  1.67*  --  1.67* 1.82*  CALCIUM 7.6*  --  7.8*  --  8.4* 8.5*  AST 43*  --  35  --  37 33  ALT 40  --  34  --  34 34  ALKPHOS 100  --  97  --  94 99  BILITOT 1.4*  --  1.2  --  0.8 0.7  ALBUMIN 2.2*  --  2.0*  --  2.3* 2.3*  MG  --   --   --   --  2.9* 3.1*  CRP 23.1*  --  25.9*  --  19.2* 11.8*  DDIMER 1.85*  --  1.40*  --  0.92* 0.49  PROCALCITON 0.80  --   --  1.48 1.24 0.69  LATICACIDVEN 1.0  --   --   --   --   --   HGBA1C  --  10.9*  --   --   --   --   BNP 39.4  --   --  111.3* 48.7 89.4    ------------------------------------------------------------------------------------------------------------------ Recent Labs    06/28/20 1211  TRIG 75    Lab Results  Component Value Date   HGBA1C 10.9 (H) 06/28/2020   ------------------------------------------------------------------------------------------------------------------ No results for input(s): TSH, T4TOTAL, T3FREE, THYROIDAB in the last 72 hours.  Invalid input(s): FREET3  Cardiac Enzymes No results for input(s): CKMB, TROPONINI, MYOGLOBIN in the last 168 hours.  Invalid input(s): CK ------------------------------------------------------------------------------------------------------------------    Component Value Date/Time   BNP 89.4 07/01/2020 0343    Micro Results Recent Results (from the past 240 hour(s))  Blood Culture (routine x 2)     Status: None (Preliminary result)   Collection Time: 06/28/20 12:11 PM   Specimen: BLOOD RIGHT WRIST  Result Value Ref Range Status   Specimen Description BLOOD RIGHT WRIST  Final   Special Requests   Final    BOTTLES DRAWN AEROBIC AND ANAEROBIC Blood Culture results may not be optimal due to an inadequate volume of blood received in culture bottles   Culture   Final    NO GROWTH 3 DAYS Performed at [facility], [place] [address], [place], [place] [address]    Report Status PENDING   Incomplete  Blood Culture (routine x 2)     Status: None (Preliminary result)   Collection Time: 06/28/20 12:11 PM   Specimen: BLOOD RIGHT FOREARM  Result Value Ref Range Status   Specimen Description BLOOD RIGHT FOREARM  Final   Special Requests   Final    BOTTLES DRAWN AEROBIC AND ANAEROBIC Blood Culture results may not be optimal due to an inadequate volume of blood received in culture bottles   Culture   Final    NO GROWTH 3 DAYS Performed at [facility], [place] [address], [place], [place] [address]    Report Status PENDING  Incomplete    Radiology Reports US RENAL  Result Date: 06/30/2020 CLINICAL DATA:  AK I EXAM: RENAL / URINARY TRACT ULTRASOUND COMPLETE COMPARISON:  September 29, 2017 FINDINGS: Right Kidney: Renal measurements: 11.5 x 6.3 x 4.9 cm = volume: 184 mL. Echogenicity within normal limits. No mass or hydronephrosis visualized. Left Kidney: Renal measurements: 12.8 x 7.1 x 6.1 cm = volume: 293 mL. Echogenicity within normal limits. No mass or hydronephrosis visualized. Bladder: Appears  normal for degree of bladder distention. The bladder is moderately distended with a volume of 905 mL. Other: None. IMPRESSION: No hydronephrosis. Electronically Signed   By: Valentino Saxon MD   On: 06/30/2020 18:53   DG Chest Port 1 View  Result Date: 06/29/2020 CLINICAL DATA:  Shortness of breath.  Coronavirus infection. EXAM: PORTABLE CHEST 1 VIEW COMPARISON:  06/28/2020 FINDINGS: Heart and mediastinal shadows remain normal. Widespread patchy pulmonary infiltrates persist consistent with viral pneumonia. No dense consolidation or lobar collapse. No visible effusion. IMPRESSION: Persistent widespread patchy pulmonary infiltrates consistent with viral pneumonia. The pattern is somewhat more nodular than often seen. If this does not resolve in a few days, chest CT could be considered to exclude nodules. Electronically Signed   By: Nelson Chimes M.D.   On: 06/29/2020 09:15   DG Chest  Port 1 View  Result Date: 06/28/2020 CLINICAL DATA:  Coronavirus infection with worsening shortness of breath and oxygen requirement. EXAM: PORTABLE CHEST 1 VIEW COMPARISON:  06/20/2020 FINDINGS: Heart and mediastinal shadows remain normal. Left system stent. Worsening of widespread bilateral hazy and patchy pulmonary infiltrates consistent with worsened viral pneumonia. No lobar consolidation or collapse. No effusion. No significant bone finding. IMPRESSION: Worsening of widespread bilateral hazy and patchy pulmonary infiltrates consistent with worsened viral pneumonia. No lobar consolidation or collapse. Electronically Signed   By: Nelson Chimes M.D.   On: 06/28/2020 11:43   DG Chest Portable 1 View  Result Date: 06/20/2020 CLINICAL DATA:  Productive cough EXAM: PORTABLE CHEST 1 VIEW COMPARISON:  Radiograph 09/21/2017 FINDINGS: Heterogeneous patchy and streaky opacities are present in both lungs with a slight basilar and peripheral predominance. No pneumothorax or visible effusion. Atherosclerotic calcification of the coronary arteries and aorta. Cardiomediastinal contours are otherwise unremarkable. No acute osseous or soft tissue abnormality. Degenerative changes are present in the imaged spine and shoulders. IMPRESSION: Heterogeneous patchy and streaky opacities in both lungs with a slight basilar and peripheral predominance. Findings are worrisome for multifocal pneumonia including potential viral etiologies. Electronically Signed   By: Lovena Le M.D.   On: 06/20/2020 19:28   VAS [place] LOWER EXTREMITY VENOUS (DVT)  Result Date: 06/30/2020  Lower Venous DVT Study Indications: Pain, and Swelling.  Risk Factors: Elevated D Dimer. Performing Technologist: Griffin Basil RCT RDMS  Examination Guidelines: A complete evaluation includes B-mode imaging, spectral Doppler, color Doppler, and power Doppler as needed of all accessible portions of each vessel. Bilateral testing is considered an integral part of  a complete examination. Limited examinations for reoccurring indications may be performed as noted. The reflux portion of the exam is performed with the patient in reverse Trendelenburg.  +---------+---------------+---------+-----------+----------+--------------+ RIGHT    CompressibilityPhasicitySpontaneityPropertiesThrombus Aging +---------+---------------+---------+-----------+----------+--------------+ CFV      Full           Yes      Yes                                 +---------+---------------+---------+-----------+----------+--------------+ SFJ      Full                                                        +---------+---------------+---------+-----------+----------+--------------+ FV Prox  Full                                                        +---------+---------------+---------+-----------+----------+--------------+  FV Mid   Full                                                        +---------+---------------+---------+-----------+----------+--------------+ FV DistalFull                                                        +---------+---------------+---------+-----------+----------+--------------+ PFV      Full                                                        +---------+---------------+---------+-----------+----------+--------------+ POP      Full           Yes      Yes                                 +---------+---------------+---------+-----------+----------+--------------+ PTV      Full                                                        +---------+---------------+---------+-----------+----------+--------------+ PERO     Full                                                        +---------+---------------+---------+-----------+----------+--------------+   +---------+---------------+---------+-----------+----------+--------------+ LEFT     CompressibilityPhasicitySpontaneityPropertiesThrombus Aging  +---------+---------------+---------+-----------+----------+--------------+ CFV      Full           Yes      Yes                                 +---------+---------------+---------+-----------+----------+--------------+ SFJ      Full                                                        +---------+---------------+---------+-----------+----------+--------------+ FV Prox  Full                                                        +---------+---------------+---------+-----------+----------+--------------+ FV Mid   Full                                                        +---------+---------------+---------+-----------+----------+--------------+  FV DistalFull                                                        +---------+---------------+---------+-----------+----------+--------------+ PFV      Full                                                        +---------+---------------+---------+-----------+----------+--------------+ POP      Full           Yes      Yes                                 +---------+---------------+---------+-----------+----------+--------------+ PTV      Full                                                        +---------+---------------+---------+-----------+----------+--------------+ PERO     Full                                                        +---------+---------------+---------+-----------+----------+--------------+     Summary: RIGHT: - There is no evidence of deep vein thrombosis in the lower extremity.  - No cystic structure found in the popliteal fossa.  LEFT: - There is no evidence of deep vein thrombosis in the lower extremity.  - No cystic structure found in the popliteal fossa.  *See table(s) above for measurements and observations. Electronically signed by Ruta Hinds MD on 06/30/2020 at 11:50:26 AM.    Final

## 2020-07-02 LAB — COMPREHENSIVE METABOLIC PANEL
ALT: 34 U/L (ref 0–44)
AST: 35 U/L (ref 15–41)
Albumin: 2.3 g/dL — ABNORMAL LOW (ref 3.5–5.0)
Alkaline Phosphatase: 95 U/L (ref 38–126)
Anion gap: 9 (ref 5–15)
BUN: 65 mg/dL — ABNORMAL HIGH (ref 8–23)
CO2: 26 mmol/L (ref 22–32)
Calcium: 8.6 mg/dL — ABNORMAL LOW (ref 8.9–10.3)
Chloride: 100 mmol/L (ref 98–111)
Creatinine, Ser: 1.7 mg/dL — ABNORMAL HIGH (ref 0.61–1.24)
GFR, Estimated: 45 mL/min — ABNORMAL LOW (ref >60)
Glucose, Bld: 125 mg/dL — ABNORMAL HIGH (ref 70–99)
Potassium: 4.1 mmol/L (ref 3.5–5.1)
Sodium: 135 mmol/L (ref 135–145)
Total Bilirubin: 0.8 mg/dL (ref 0.3–1.2)
Total Protein: 6.6 g/dL (ref 6.5–8.1)

## 2020-07-02 LAB — CBC WITH DIFFERENTIAL/PLATELET
Abs Immature Granulocytes: 0.07 10*3/uL (ref 0.00–0.07)
Basophils Absolute: 0 10*3/uL (ref 0.0–0.1)
Basophils Relative: 0 %
Eosinophils Absolute: 0 10*3/uL (ref 0.0–0.5)
Eosinophils Relative: 0 %
HCT: 34.2 % — ABNORMAL LOW (ref 39.0–52.0)
Hemoglobin: 11.3 g/dL — ABNORMAL LOW (ref 13.0–17.0)
Immature Granulocytes: 1 %
Lymphocytes Relative: 10 %
Lymphs Abs: 1 10*3/uL (ref 0.7–4.0)
MCH: 28.4 pg (ref 26.0–34.0)
MCHC: 33 g/dL (ref 30.0–36.0)
MCV: 85.9 fL (ref 80.0–100.0)
Monocytes Absolute: 0.9 10*3/uL (ref 0.1–1.0)
Monocytes Relative: 8 %
Neutro Abs: 9 10*3/uL — ABNORMAL HIGH (ref 1.7–7.7)
Neutrophils Relative %: 81 %
Platelets: 361 10*3/uL (ref 150–400)
RBC: 3.98 MIL/uL — ABNORMAL LOW (ref 4.22–5.81)
RDW: 12.8 % (ref 11.5–15.5)
WBC: 10.9 10*3/uL — ABNORMAL HIGH (ref 4.0–10.5)
nRBC: 0 % (ref 0.0–0.2)

## 2020-07-02 LAB — GLUCOSE, CAPILLARY
Glucose-Capillary: 115 mg/dL — ABNORMAL HIGH (ref 70–99)
Glucose-Capillary: 237 mg/dL — ABNORMAL HIGH (ref 70–99)

## 2020-07-02 LAB — D-DIMER, QUANTITATIVE: D-Dimer, Quant: 0.68 ug/mL-FEU — ABNORMAL HIGH (ref 0.00–0.50)

## 2020-07-02 LAB — C-REACTIVE PROTEIN: CRP: 8.5 mg/dL — ABNORMAL HIGH (ref <1.0)

## 2020-07-02 LAB — MAGNESIUM: Magnesium: 3.2 mg/dL — ABNORMAL HIGH (ref 1.7–2.4)

## 2020-07-02 LAB — BRAIN NATRIURETIC PEPTIDE: B Natriuretic Peptide: 46.3 pg/mL (ref 0.0–100.0)

## 2020-07-02 MED ORDER — AMLODIPINE BESYLATE 10 MG PO TABS
10.0000 mg | ORAL_TABLET | Freq: Every day | ORAL | Status: DC
  Administered 2020-07-02: 10 mg via ORAL
  Filled 2020-07-02: qty 1

## 2020-07-02 MED ORDER — METHYLPREDNISOLONE 4 MG PO TBPK: ORAL_TABLET | ORAL | 0 refills | Status: DC

## 2020-07-02 MED ORDER — APIXABAN 5 MG PO TABS
5.0000 mg | ORAL_TABLET | Freq: Two times a day (BID) | ORAL | Status: DC
  Administered 2020-07-02: 5 mg via ORAL
  Filled 2020-07-02: qty 1

## 2020-07-02 MED ORDER — AMLODIPINE BESYLATE 10 MG PO TABS
10.0000 mg | ORAL_TABLET | Freq: Every day | ORAL | 0 refills | Status: DC
Start: 2020-07-02 — End: 2020-10-16

## 2020-07-02 NOTE — Discharge Instructions (Signed)
Follow with Primary MD Ladell Pier, MD in 7 days   Get CBC, CMP, 2 view Chest X ray -  checked next visit within 1 week by Primary MD   Activity: As tolerated with Full fall precautions use walker/cane & assistance as needed  Disposition Home   Diet: Heart Healthy Low Carb  Special Instructions: If you have smoked or chewed Tobacco  in the last 2 yrs please stop smoking, stop any regular Alcohol  and or any Recreational drug use.  On your next visit with your primary care physician please Get Medicines reviewed and adjusted.  Please request your Prim.MD to go over all Hospital Tests and Procedure/Radiological results at the follow up, please get all Hospital records sent to your Prim MD by signing hospital release before you go home.  If you experience worsening of your admission symptoms, develop shortness of breath, life threatening emergency, suicidal or homicidal thoughts you must seek medical attention immediately by calling 911 or calling your MD immediately  if symptoms less severe.  You Must read complete instructions/literature along with all the possible adverse reactions/side effects for all the Medicines you take and that have been prescribed to you. Take any new Medicines after you have completely understood and accpet all the possible adverse reactions/side effects.          Person Under Monitoring Name: Derek Lowery  Location: [place] [address]   Infection Prevention Recommendations for Individuals Confirmed to have, or Being Evaluated for, 2019 Novel Coronavirus (COVID-19) Infection Who Receive Care at Home  Individuals who are confirmed to have, or are being evaluated for, COVID-19 should follow the prevention steps below until a healthcare provider or local or state health department says they can return to normal activities.  Stay home except to get medical care You should restrict activities outside your home, except for  getting medical care. Do not go to work, school, or public areas, and do not use public transportation or taxis.  Call ahead before visiting your doctor Before your medical appointment, call the healthcare provider and tell them that you have, or are being evaluated for, COVID-19 infection. This will help the healthcare provider's office take steps to keep other people from getting infected. Ask your healthcare provider to call the local or state health department.  Monitor your symptoms Seek prompt medical attention if your illness is worsening (e.g., difficulty breathing). Before going to your medical appointment, call the healthcare provider and tell them that you have, or are being evaluated for, COVID-19 infection. Ask your healthcare provider to call the local or state health department.  Wear a facemask You should wear a facemask that covers your nose and mouth when you are in the same room with other people and when you visit a healthcare provider. People who live with or visit you should also wear a facemask while they are in the same room with you.  Separate yourself from other people in your home As much as possible, you should stay in a different room from other people in your home. Also, you should use a separate bathroom, if available.  Avoid sharing household items You should not share dishes, drinking glasses, cups, eating utensils, towels, bedding, or other items with other people in your home. After using these items, you should wash them thoroughly with soap and water.  Cover your coughs and sneezes Cover your mouth and nose with a tissue when you cough or sneeze, or you  can cough or sneeze into your sleeve. Throw used tissues in a lined trash can, and immediately wash your hands with soap and water for at least 20 seconds or use an alcohol-based hand rub.  Wash your [facility] your hands often and thoroughly with soap and water for at least 20 seconds. You can use  an alcohol-based hand sanitizer if soap and water are not available and if your hands are not visibly dirty. Avoid touching your eyes, nose, and mouth with unwashed hands.   Prevention Steps for Caregivers and Household Members of Individuals Confirmed to have, or Being Evaluated for, COVID-19 Infection Being Cared for in the Home  If you live with, or provide care at home for, a person confirmed to have, or being evaluated for, COVID-19 infection please follow these guidelines to prevent infection:  Follow healthcare provider's instructions Make sure that you understand and can help the patient follow any healthcare provider instructions for all care.  Provide for the patient's basic needs You should help the patient with basic needs in the home and provide support for getting groceries, prescriptions, and other personal needs.  Monitor the patient's symptoms If they are getting sicker, call his or her medical provider and tell them that the patient has, or is being evaluated for, COVID-19 infection. This will help the healthcare provider's office take steps to keep other people from getting infected. Ask the healthcare provider to call the local or state health department.  Limit the number of people who have contact with the patient  If possible, have only one caregiver for the patient.  Other household members should stay in another home or place of residence. If this is not possible, they should stay  in another room, or be separated from the patient as much as possible. Use a separate bathroom, if available.  Restrict visitors who do not have an essential need to be in the home.  Keep older adults, very young children, and other sick people away from the patient Keep older adults, very young children, and those who have compromised immune systems or chronic health conditions away from the patient. This includes people with chronic heart, lung, or kidney conditions, diabetes,  and cancer.  Ensure good ventilation Make sure that shared spaces in the home have good air flow, such as from an air conditioner or an opened window, weather permitting.  Wash your hands often  Wash your hands often and thoroughly with soap and water for at least 20 seconds. You can use an alcohol based hand sanitizer if soap and water are not available and if your hands are not visibly dirty.  Avoid touching your eyes, nose, and mouth with unwashed hands.  Use disposable paper towels to dry your hands. If not available, use dedicated cloth towels and replace them when they become wet.  Wear a facemask and gloves  Wear a disposable facemask at all times in the room and gloves when you touch or have contact with the patient's blood, body fluids, and/or secretions or excretions, such as sweat, saliva, sputum, nasal mucus, vomit, urine, or feces.  Ensure the mask fits over your nose and mouth tightly, and do not touch it during use.  Throw out disposable facemasks and gloves after using them. Do not reuse.  Wash your hands immediately after removing your facemask and gloves.  If your personal clothing becomes contaminated, carefully remove clothing and launder. Wash your hands after handling contaminated clothing.  Place all used disposable facemasks, gloves,  and other waste in a lined container before disposing them with other household waste.  Remove gloves and wash your hands immediately after handling these items.  Do not share dishes, glasses, or other household items with the patient  Avoid sharing household items. You should not share dishes, drinking glasses, cups, eating utensils, towels, bedding, or other items with a patient who is confirmed to have, or being evaluated for, COVID-19 infection.  After the person uses these items, you should wash them thoroughly with soap and water.  Wash laundry thoroughly  Immediately remove and wash clothes or bedding that have blood,  body fluids, and/or secretions or excretions, such as sweat, saliva, sputum, nasal mucus, vomit, urine, or feces, on them.  Wear gloves when handling laundry from the patient.  Read and follow directions on labels of laundry or clothing items and detergent. In general, wash and dry with the warmest temperatures recommended on the label.  Clean all areas the individual has used often  Clean all touchable surfaces, such as counters, tabletops, doorknobs, bathroom fixtures, toilets, phones, keyboards, tablets, and bedside tables, every day. Also, clean any surfaces that may have blood, body fluids, and/or secretions or excretions on them.  Wear gloves when cleaning surfaces the patient has come in contact with.  Use a diluted bleach solution (e.g., dilute bleach with 1 part bleach and 10 parts water) or a household disinfectant with a label that says EPA-registered for coronaviruses. To make a bleach solution at home, add 1 tablespoon of bleach to 1 quart (4 cups) of water. For a larger supply, add  cup of bleach to 1 gallon (16 cups) of water.  Read labels of cleaning products and follow recommendations provided on product labels. Labels contain instructions for safe and effective use of the cleaning product including precautions you should take when applying the product, such as wearing gloves or eye protection and making sure you have good ventilation during use of the product.  Remove gloves and wash hands immediately after cleaning.  Monitor yourself for signs and symptoms of illness Caregivers and household members are considered close contacts, should monitor their health, and will be asked to limit movement outside of the home to the extent possible. Follow the monitoring steps for close contacts listed on the symptom monitoring form.   ? If you have additional questions, contact your local health department or call the epidemiologist on call at [phone] (available 24/7). ? This  guidance is subject to change. For the most up-to-date guidance from [facility], please refer to their website: YouBlogs.pl   Information on my medicine - ELIQUIS (apixaban)  This medication education was reviewed with me or my healthcare representative as part of my discharge preparation.   Why was Eliquis prescribed for you? Eliquis was prescribed for you to reduce the risk of a blood clot forming that can cause a stroke.  Please discuss with your primary care provider to confirm why this medication prescribed to you.   What do You need to know about Eliquis ? Take your Eliquis TWICE DAILY - one tablet in the morning and one tablet in the evening with or without food. If you have difficulty swallowing the tablet whole please discuss with your pharmacist how to take the medication safely.  Take Eliquis exactly as prescribed by your doctor and DO NOT stop taking Eliquis without talking to the doctor who prescribed the medication.  Stopping may increase your risk of developing a stroke.  Refill your prescription before you run  out.  After discharge, you should have regular check-up appointments with your healthcare provider that is prescribing your Eliquis.  In the future your dose may need to be changed if your kidney function or weight changes by a significant amount or as you get older.  What do you do if you miss a dose? If you miss a dose, take it as soon as you remember on the same day and resume taking twice daily.  Do not take more than one dose of ELIQUIS at the same time to make up a missed dose.  Important Safety Information A possible side effect of Eliquis is bleeding. You should call your healthcare provider right away if you experience any of the following: ? Bleeding from an injury or your nose that does not stop. ? Unusual colored urine (red or dark brown) or unusual colored stools (red or black). ? Unusual  bruising for unknown reasons. ? A serious fall or if you hit your head (even if there is no bleeding).  Some medicines may interact with Eliquis and might increase your risk of bleeding or clotting while on Eliquis. To help avoid this, consult your healthcare provider or pharmacist prior to using any new prescription or non-prescription medications, including herbals, vitamins, non-steroidal anti-inflammatory drugs (NSAIDs) and supplements.  This website has more information on Eliquis (apixaban): http://www.eliquis.com/eliquis/home

## 2020-07-02 NOTE — Progress Notes (Signed)
Physical Therapy Treatment Patient Details Name: Derek Lowery MRN: [id number] DOB: [birth year] Today's Date: 07/02/2020    History of Present Illness 64 y.o. male with medical history significant of ischemic heart disease, coronary artery disease, hypertension, dyslipidemia, type 2 diabetes mellitus and glaucoma who was diagnosed with SARS COVID-19 November 3, He presented to the hospital 11/11 with shortness of breath and was diagnosed with acute hypoxic respiratory failure due to COVID-19 pneumonia along with mild gastroenteritis from COVID-19.    PT Comments    Patient scheduled to discharge home today. Educated on managing home O2 including long tubing during mobility with RW. Patient did very well with task with only min cuing during turning. Notified OT of pt's plan to discharge and their plan is to see pt to review energy conservation techniques and managing oxygen during ADLs.     Follow Up Recommendations  Home health PT;Supervision for mobility/OOB     Equipment Recommendations  None recommended by PT    Recommendations for Other Services       Precautions / Restrictions Precautions Precautions: Fall Precaution Comments: baseline tremor    Mobility  Bed Mobility               General bed mobility comments: pt up in chair, returned to chair  Transfers Overall transfer level: Needs assistance Equipment used: Rolling walker (2 wheeled) Transfers: Sit to/from Stand Sit to Stand: Supervision         General transfer comment: vc as turning to sit to keep from wrapping himself in O2 tubing  Ambulation/Gait Ambulation/Gait assistance: Supervision Gait Distance (Feet): 50 Feet Assistive device: Rolling walker (2 wheeled) Gait Pattern/deviations: Step-through pattern;Decreased stride length Gait velocity: decreased   General Gait Details: Pt able to "let out" and "reel in" O2 tubing while managing RW after demonstration by PT. Pt stops, assures his balance  and then uses both hands to manage O2 tubing prior to resuming walking. Pt mindful of keeping tubing from getting caught on furniture. Educated to use brightly colored electrical tape to stripe/mark tubing so it is easier to see against his dark flooring.   Stairs             Wheelchair Mobility    Modified Rankin (Stroke Patients Only)       Balance Overall balance assessment: Needs assistance Sitting-balance support: No upper extremity supported;Feet supported Sitting balance-Leahy Scale: Good     Standing balance support: No upper extremity supported;Bilateral upper extremity supported;During functional activity Standing balance-Leahy Scale: Fair Standing balance comment: static stand without UE support, RW for amb                            Cognition Arousal/Alertness: Awake/alert Behavior During Therapy: WFL for tasks assessed/performed Overall Cognitive Status: Within Functional Limits for tasks assessed                                 General Comments: Educated on managing O2 tubing and then able to carryover with cuing only when turning.       Exercises      General Comments General comments (skin integrity, edema, etc.): 2L at rest, sats 96%; with ambulation 90%.       Pertinent Vitals/Pain Pain Assessment: No/denies pain    Home Living  Prior Function            PT Goals (current goals can now be found in the care plan section) Acute Rehab PT Goals Patient Stated Goal: home Time For Goal Achievement: 07/13/20 Potential to Achieve Goals: Good Progress towards PT goals: Progressing toward goals    Frequency    Min 3X/week      PT Plan Current plan remains appropriate    Co-evaluation              AM-PAC PT "6 Clicks" Mobility   Outcome Measure  Help needed turning from your back to your side while in a flat bed without using bedrails?: A Little Help needed moving from lying  on your back to sitting on the side of a flat bed without using bedrails?: A Little Help needed moving to and from a bed to a chair (including a wheelchair)?: A Little Help needed standing up from a chair using your arms (e.g., wheelchair or bedside chair)?: A Little Help needed to walk in hospital room?: A Little Help needed climbing 3-5 steps with a railing? : A Lot 6 Click Score: 17    End of Session Equipment Utilized During Treatment: Oxygen Activity Tolerance: Patient tolerated treatment well Patient left: in chair;with call bell/phone within reach Nurse Communication: Mobility status;Other (comment) (added extension tubing for him to practice as at home) PT Visit Diagnosis: Other abnormalities of gait and mobility (R26.89);Difficulty in walking, not elsewhere classified (R26.2);Muscle weakness (generalized) (M62.81)     Time: 7847-8412 PT Time Calculation (min) (ACUTE ONLY): 26 min  Charges:  $Gait Training: 23-37 mins                      Arby Barrette, PT Pager [phone]    Rexanne Mano 07/02/2020, 9:59 AM

## 2020-07-02 NOTE — TOC Transition Note (Addendum)
Transition of Care [facility]) - CM/SW Discharge Note   Patient Details  Name: Derek Lowery MRN: [id number] Date of Birth: 1956/03/05  [facility] CM/SW Contact:  Pollie Friar, RN Phone Number: 07/02/2020, 10:20 AM   Clinical Narrative:    Pt is discharging home with resumption of [place] services through [facility]. Erin with [facility] updated on d/c today. Oxygen for home ordered and delivered to the room per [facility].  Pt has transport home.  1110: [place] says they are not active with pt for [facility] and do not have the staff to accept. CM reached out to pt and he doesn't have a preference. Tommi Rumps with [facility] accepted the referral.    Final next level of care: [place] Barriers to Discharge: No Barriers Identified   Patient Goals and CMS Choice   CMS Medicare.gov Compare Post Acute Care list provided to:: Patient Choice offered to / list presented to : Patient  Discharge Placement                       Discharge Plan and Services                DME Arranged: Oxygen DME Agency: [facility] Date DME Agency Contacted: 07/02/20   Representative spoke with at DME Agency: [place]: PT, RN [place] Agency: [place] ([place]) Date HH Agency Contacted: 07/02/20   Representative spoke with at [place]: [place] ([place]) Interventions     Readmission Risk Interventions No flowsheet data found.

## 2020-07-02 NOTE — Discharge Summary (Signed)
Derek Lowery:[id number] DOB: Jan 07, 1956 DOA: 06/28/2020  PCP: Ladell Pier, MD  Admit date: 06/28/2020  Discharge date: 07/02/2020  Admitted From: Home   Disposition:  Home   Recommendations for Outpatient Follow-up:   Follow up with PCP in 1-2 weeks  PCP Please obtain BMP/CBC, 2 view CXR in 1week,  (see Discharge instructions)   PCP Please follow up on the following pending results: Monitor CBC, CMP, 2 view chest x-ray and glycemic control in 7 to 10 days.   Home Health: PT, RN Equipment/Devices: 2lit nasal cannula oxygen Consultations: None  Discharge Condition: Stable    CODE STATUS: Full    Diet Recommendation: Heart Healthy Low Carb  Diet Order            Diet - low sodium heart healthy           Diet heart healthy/carb modified Room service appropriate? Yes; Fluid consistency: Thin  Diet effective now                  Chief Complaint  Patient presents with  . Covid Positive     Brief history of present illness from the day of admission and additional interim summary    Derek Lowery a 64 y.o.malewith medical history significant ofischemic heart disease, coronary artery disease, hypertension, dyslipidemia, type 2 diabetes mellitus and glaucoma who was diagnosed with SARS COVID-19 November3, resented to the hospital with shortness of breath and was diagnosed with acute hypoxic respiratory failure due to COVID-19 pneumonia along with mild gastroenteritis from COVID-19.                                                                 Hospital Course   1. Acute Hypoxic Resp. Failure due to Acute Covid 19 Viral Pneumonitis during the ongoing 2020 Covid 19 Pandemic - he unfortunately was unvaccinated and had incurred moderate to severe parenchymal lung injury, being treated  with IV steroids, Remdesivir and Baricitinib. Initial positive date 06/20/2020.  Symptoms much better, symptom-free on room air at rest upon ambulation requiring 2 to 3 l of oxygen.  Will be discharged home on Medrol Dosepak along with rescue inhaler and 2 l nasal cannula oxygen as needed with 1 week outpatient follow-up with PCP post discharge.    Recent Labs  Lab 06/28/20 1211 06/29/20 0043 06/29/20 0800 06/30/20 0133 07/01/20 0343 07/02/20 0245  WBC 7.7  --   --  8.7 7.8 10.9*  CRP 23.1* 25.9*  --  19.2* 11.8* 8.5*  DDIMER 1.85* 1.40*  --  0.92* 0.49 0.68*  BNP 39.4  --  111.3* 48.7 89.4 46.3  PROCALCITON 0.80  --  1.48 1.24 0.69  --   LATICACIDVEN 1.0  --   --   --   --   --  AST 43* 35  --  37 33 35  ALT 40 34  --  34 34 34  ALKPHOS 100 97  --  94 99 95  BILITOT 1.4* 1.2  --  0.8 0.7 0.8  ALBUMIN 2.2* 2.0*  --  2.3* 2.3* 2.3*       2.  Mild elevation of D-dimer likely due to inflammation from COVID-19, negative leg ultrasound.  We will continue home dose Eliquis, of note patient is on Eliquis and this medication was added a day after his admission to his home medication list, he has no recollection of who started this medication and why.  Will defer this to PCP.   3.  AKI versus CKD 3B.  Last creatinine in our system from several months ago is 1.5, he was on 2 diuretics along with ACE inhibitor, HCTZ and ACE inhibitor have been discontinued, PCP to monitor renal function, if needed outpatient nephrology consult.  Renal ultrasound was nonacute.  4.  COVID-19 and used gastroenteritis.  Empirically resolved.  5. CAD.  On aspirin, statin, symptom-free.  Again unclear why he has been recently started on Eliquis, he says her home health nurse had started it but does not know why.  6.  DM type II.    For outpatient control due to hyperglycemia, home regimen for now continue then follow with PCP in a week for further adjustment.  Lab Results  Component Value Date   HGBA1C 10.9  (H) 06/28/2020     Discharge diagnosis     Principal Problem:   Pneumonia due to COVID-19 virus Active Problems:   Type 2 diabetes mellitus with complication, with long-term current use of insulin (HCC)   HLD (hyperlipidemia)   Essential hypertension   Diabetic polyneuropathy associated with type 2 diabetes mellitus (HCC)   Glaucoma   Legally blind   Acute respiratory failure with hypoxia ([place])    Discharge instructions    Discharge Instructions    Diet - low sodium heart healthy   Complete by: As directed    Discharge instructions   Complete by: As directed    Follow with Primary MD Ladell Pier, MD in 7 days   Get CBC, CMP, 2 view Chest X ray -  checked next visit within 1 week by Primary MD   Activity: As tolerated with Full fall precautions use walker/cane & assistance as needed  Disposition Home   Diet: Heart Healthy Low Carb  Special Instructions: If you have smoked or chewed Tobacco  in the last 2 yrs please stop smoking, stop any regular Alcohol  and or any Recreational drug use.  On your next visit with your primary care physician please Get Medicines reviewed and adjusted.  Please request your Prim.MD to go over all Hospital Tests and Procedure/Radiological results at the follow up, please get all Hospital records sent to your Prim MD by signing hospital release before you go home.  If you experience worsening of your admission symptoms, develop shortness of breath, life threatening emergency, suicidal or homicidal thoughts you must seek medical attention immediately by calling 911 or calling your MD immediately  if symptoms less severe.  You Must read complete instructions/literature along with all the possible adverse reactions/side effects for all the Medicines you take and that have been prescribed to you. Take any new Medicines after you have completely understood and accpet all the possible adverse reactions/side effects.   Increase activity slowly    Complete by: As directed  Discharge Medications   Allergies as of 07/02/2020   No Known Allergies     Medication List    STOP taking these medications   azithromycin 250 MG tablet Commonly known as: ZITHROMAX   hydrochlorothiazide 12.5 MG tablet Commonly known as: HYDRODIURIL   latanoprost 0.005 % ophthalmic solution Commonly known as: XALATAN   lisinopril 10 MG tablet Commonly known as: ZESTRIL   lisinopril 20 MG tablet Commonly known as: ZESTRIL     TAKE these medications   albuterol 108 (90 Base) MCG/ACT inhaler Commonly known as: VENTOLIN HFA Inhale 1-2 puffs into the lungs every 6 (six) hours as needed for wheezing or shortness of breath.   amLODipine 10 MG tablet Commonly known as: NORVASC Take 1 tablet (10 mg total) by mouth daily.   ammonium lactate 12 % lotion Commonly known as: AmLactin Apply to both feet twice daily for dry skin. What changed:   how much to take  how to take this  when to take this  reasons to take this   aspirin EC 81 MG tablet Take 1 tablet (81 mg total) by mouth daily.   atorvastatin 40 MG tablet Commonly known as: LIPITOR TAKE 1 TABLET BY MOUTH EVERY DAY   BD Pen Needle Nano U/F 32G X 4 MM Misc Generic drug: Insulin Pen Needle USE AS DIRECTED 3 TIMES A DAY. Please schedule an appointment for additional refills.   blood glucose meter kit and supplies Kit Dispense based on patient and insurance preference. Use up to four times daily as directed. One Touch Verio   brimonidine 0.2 % ophthalmic solution Commonly known as: ALPHAGAN Place 1 drop into the right eye 2 (two) times daily.   dorzolamide 2 % ophthalmic solution Commonly known as: TRUSOPT Place 1 drop into the right eye 2 (two) times daily.   Eliquis 5 MG Tabs tablet Generic drug: apixaban Take 5 mg by mouth 2 (two) times daily.   FreeStyle Libre Sensor System Misc Change sensor Q 2 wks   furosemide 20 MG tablet Commonly known as: LASIX TAKE 1  TABLET (20 MG TOTAL) BY MOUTH DAILY. STOP LISINOPRIL/HCTZ What changed:   when to take this  additional instructions   glucose blood test strip Commonly known as: Accu-Chek Aviva Plus Use as instructed for 3 times daily testing of blood sugar. E11.9   glucose monitoring kit monitoring kit 1 each by Does not apply route as needed for other.   insulin lispro 100 UNIT/ML KwikPen Commonly known as: HumaLOG KwikPen 10 units TID with meals What changed:   how much to take  how to take this  when to take this  additional instructions   Lancets Misc Use as directed.  Accu chek 2   Lantus SoloStar 100 UNIT/ML Solostar Pen Generic drug: insulin glargine INJECT 34 UNITS INTO THE SKIN DAILY. Please make PCP appointment. What changed:   how much to take  how to take this  when to take this  additional instructions   methylPREDNISolone 4 MG Tbpk tablet Commonly known as: MEDROL DOSEPAK follow package directions   multivitamin with minerals tablet Take 1 tablet by mouth daily.   Rhopressa 0.02 % Soln Generic drug: Netarsudil Dimesylate Place 1 drop into the right eye at bedtime.   timolol 0.5 % ophthalmic solution Commonly known as: BETIMOL Place 1 drop into the right eye 2 (two) times daily.   Vyzulta 0.024 % Soln Generic drug: Latanoprostene Bunod Place 1 drop into the right eye daily.  Durable Medical Equipment  (From admission, onward)         Start     Ordered   07/02/20 0736  For home use only DME oxygen  Once       Question Answer Comment  Length of Need 6 Months   Mode or (Route) Nasal cannula   Liters per Minute 2   Frequency Continuous (stationary and portable oxygen unit needed)   Oxygen conserving device Yes   Oxygen delivery system Gas      07/02/20 0735           Follow-up Information    Ladell Pier, MD. Schedule an appointment as soon as possible for a visit in 1 week(s).   Specialty: Internal Medicine Contact  information: [place] [address] [phone]        Skeet Latch, MD .   Specialty: Cardiology Contact information: [address] [place] [address] [place] Follow up.   Why: The home health agency will contact you for the next home visit. Contact information: [phone]              Major procedures and Radiology Reports - PLEASE review detailed and final reports thoroughly  -       US RENAL  Result Date: 06/30/2020 CLINICAL DATA:  AK I EXAM: RENAL / URINARY TRACT ULTRASOUND COMPLETE COMPARISON:  September 29, 2017 FINDINGS: Right Kidney: Renal measurements: 11.5 x 6.3 x 4.9 cm = volume: 184 mL. Echogenicity within normal limits. No mass or hydronephrosis visualized. Left Kidney: Renal measurements: 12.8 x 7.1 x 6.1 cm = volume: 293 mL. Echogenicity within normal limits. No mass or hydronephrosis visualized. Bladder: Appears normal for degree of bladder distention. The bladder is moderately distended with a volume of 905 mL. Other: None. IMPRESSION: No hydronephrosis. Electronically Signed   By: Valentino Saxon MD   On: 06/30/2020 18:53   DG Chest Port 1 View  Result Date: 06/29/2020 CLINICAL DATA:  Shortness of breath.  Coronavirus infection. EXAM: PORTABLE CHEST 1 VIEW COMPARISON:  06/28/2020 FINDINGS: Heart and mediastinal shadows remain normal. Widespread patchy pulmonary infiltrates persist consistent with viral pneumonia. No dense consolidation or lobar collapse. No visible effusion. IMPRESSION: Persistent widespread patchy pulmonary infiltrates consistent with viral pneumonia. The pattern is somewhat more nodular than often seen. If this does not resolve in a few days, chest CT could be considered to exclude nodules. Electronically Signed   By: Nelson Chimes M.D.   On: 06/29/2020 09:15   DG Chest Port 1 View  Result Date: 06/28/2020 CLINICAL DATA:  Coronavirus infection with worsening  shortness of breath and oxygen requirement. EXAM: PORTABLE CHEST 1 VIEW COMPARISON:  06/20/2020 FINDINGS: Heart and mediastinal shadows remain normal. Left system stent. Worsening of widespread bilateral hazy and patchy pulmonary infiltrates consistent with worsened viral pneumonia. No lobar consolidation or collapse. No effusion. No significant bone finding. IMPRESSION: Worsening of widespread bilateral hazy and patchy pulmonary infiltrates consistent with worsened viral pneumonia. No lobar consolidation or collapse. Electronically Signed   By: Nelson Chimes M.D.   On: 06/28/2020 11:43   DG Chest Portable 1 View  Result Date: 06/20/2020 CLINICAL DATA:  Productive cough EXAM: PORTABLE CHEST 1 VIEW COMPARISON:  Radiograph 09/21/2017 FINDINGS: Heterogeneous patchy and streaky opacities are present in both lungs with a slight basilar and peripheral predominance. No pneumothorax or visible effusion. Atherosclerotic calcification of the coronary arteries and aorta. Cardiomediastinal contours  are otherwise unremarkable. No acute osseous or soft tissue abnormality. Degenerative changes are present in the imaged spine and shoulders. IMPRESSION: Heterogeneous patchy and streaky opacities in both lungs with a slight basilar and peripheral predominance. Findings are worrisome for multifocal pneumonia including potential viral etiologies. Electronically Signed   By: Lovena Le M.D.   On: 06/20/2020 19:28   VAS [place] LOWER EXTREMITY VENOUS (DVT)  Result Date: 06/30/2020  Lower Venous DVT Study Indications: Pain, and Swelling.  Risk Factors: Elevated D Dimer. Performing Technologist: Griffin Basil RCT RDMS  Examination Guidelines: A complete evaluation includes B-mode imaging, spectral Doppler, color Doppler, and power Doppler as needed of all accessible portions of each vessel. Bilateral testing is considered an integral part of a complete examination. Limited examinations for reoccurring indications may be performed as  noted. The reflux portion of the exam is performed with the patient in reverse Trendelenburg.  +---------+---------------+---------+-----------+----------+--------------+ RIGHT    CompressibilityPhasicitySpontaneityPropertiesThrombus Aging +---------+---------------+---------+-----------+----------+--------------+ CFV      Full           Yes      Yes                                 +---------+---------------+---------+-----------+----------+--------------+ SFJ      Full                                                        +---------+---------------+---------+-----------+----------+--------------+ FV Prox  Full                                                        +---------+---------------+---------+-----------+----------+--------------+ FV Mid   Full                                                        +---------+---------------+---------+-----------+----------+--------------+ FV DistalFull                                                        +---------+---------------+---------+-----------+----------+--------------+ PFV      Full                                                        +---------+---------------+---------+-----------+----------+--------------+ POP      Full           Yes      Yes                                 +---------+---------------+---------+-----------+----------+--------------+ PTV      Full                                                        +---------+---------------+---------+-----------+----------+--------------+  PERO     Full                                                        +---------+---------------+---------+-----------+----------+--------------+   +---------+---------------+---------+-----------+----------+--------------+ LEFT     CompressibilityPhasicitySpontaneityPropertiesThrombus Aging +---------+---------------+---------+-----------+----------+--------------+ CFV      Full            Yes      Yes                                 +---------+---------------+---------+-----------+----------+--------------+ SFJ      Full                                                        +---------+---------------+---------+-----------+----------+--------------+ FV Prox  Full                                                        +---------+---------------+---------+-----------+----------+--------------+ FV Mid   Full                                                        +---------+---------------+---------+-----------+----------+--------------+ FV DistalFull                                                        +---------+---------------+---------+-----------+----------+--------------+ PFV      Full                                                        +---------+---------------+---------+-----------+----------+--------------+ POP      Full           Yes      Yes                                 +---------+---------------+---------+-----------+----------+--------------+ PTV      Full                                                        +---------+---------------+---------+-----------+----------+--------------+ PERO     Full                                                        +---------+---------------+---------+-----------+----------+--------------+  Summary: RIGHT: - There is no evidence of deep vein thrombosis in the lower extremity.  - No cystic structure found in the popliteal fossa.  LEFT: - There is no evidence of deep vein thrombosis in the lower extremity.  - No cystic structure found in the popliteal fossa.  *See table(s) above for measurements and observations. Electronically signed by Ruta Hinds MD on 06/30/2020 at 11:50:26 AM.    Final     Micro Results     Recent Results (from the past 240 hour(s))  Blood Culture (routine x 2)     Status: None (Preliminary result)   Collection Time: 06/28/20 12:11 PM   Specimen:  BLOOD RIGHT WRIST  Result Value Ref Range Status   Specimen Description BLOOD RIGHT WRIST  Final   Special Requests   Final    BOTTLES DRAWN AEROBIC AND ANAEROBIC Blood Culture results may not be optimal due to an inadequate volume of blood received in culture bottles   Culture   Final    NO GROWTH 3 DAYS Performed at [facility], [place] [address] [place], [place] [address]    Report Status PENDING  Incomplete  Blood Culture (routine x 2)     Status: None (Preliminary result)   Collection Time: 06/28/20 12:11 PM   Specimen: BLOOD RIGHT FOREARM  Result Value Ref Range Status   Specimen Description BLOOD RIGHT FOREARM  Final   Special Requests   Final    BOTTLES DRAWN AEROBIC AND ANAEROBIC Blood Culture results may not be optimal due to an inadequate volume of blood received in culture bottles   Culture   Final    NO GROWTH 3 DAYS Performed at [facility], [place] [address], [place], [place] [address]    Report Status PENDING  Incomplete    Today   Subjective    Emily Forse today has no headache,no chest abdominal pain,no new weakness tingling or numbness, feels much better wants to go home today.     Objective   Blood pressure (!) 146/83, pulse 61, temperature (!) 97.5 F (36.4 C), temperature source Oral, resp. rate 13, height 5' 10"  (1.778 m), weight 107.6 kg, SpO2 96 %.   Intake/Output Summary (Last 24 hours) at 07/02/2020 0918 Last data filed at 07/02/2020 0437 Gross per 24 hour  Intake 240 ml  Output --  Net 240 ml    Exam  Awake Alert, No new F.N deficits, Normal affect [place].AT,PERRAL Supple Neck,No JVD, No cervical lymphadenopathy appriciated.  Symmetrical Chest wall movement, Good air movement bilaterally, CTAB RRR,No Gallops,Rubs or new Murmurs, No Parasternal Heave +ve B.Sounds, Abd Soft, Non tender, No organomegaly appriciated, No rebound -guarding or rigidity. No Cyanosis, Clubbing or edema, No new Rash or bruise   Data Review   CBC w  Diff:  Lab Results  Component Value Date   WBC 10.9 (H) 07/02/2020   HGB 11.3 (L) 07/02/2020   HGB 12.6 (L) 07/12/2019   HCT 34.2 (L) 07/02/2020   HCT 39.6 07/12/2019   PLT 361 07/02/2020   PLT 138 (L) 07/12/2019   LYMPHOPCT 10 07/02/2020   MONOPCT 8 07/02/2020   EOSPCT 0 07/02/2020   BASOPCT 0 07/02/2020    CMP:  Lab Results  Component Value Date   NA 135 07/02/2020   NA 141 07/12/2019   K 4.1 07/02/2020   CL 100 07/02/2020   CO2 26 07/02/2020   BUN 65 (H) 07/02/2020   BUN 25 07/12/2019   CREATININE 1.70 (H)  07/02/2020   CREATININE 0.80 09/02/2016   PROT 6.6 07/02/2020   PROT 6.5 07/12/2019   ALBUMIN 2.3 (L) 07/02/2020   ALBUMIN 3.9 07/12/2019   BILITOT 0.8 07/02/2020   BILITOT 0.6 07/12/2019   ALKPHOS 95 07/02/2020   AST 35 07/02/2020   ALT 34 07/02/2020  .   Total Time in preparing paper work, data evaluation and todays exam - 16 minutes  Lala Lund M.D on 07/02/2020 at [place]  [phone]

## 2020-07-02 NOTE — Progress Notes (Signed)
Occupational Therapy Treatment Note  Pt making steady progress with OT. Continue to recommend follow up with [facility] after DC.     07/02/20 1300  OT Visit Information  Last OT Received On 07/02/20  Assistance Needed +1  History of Present Illness 64 y.o. male with medical history significant of ischemic heart disease, coronary artery disease, hypertension, dyslipidemia, type 2 diabetes mellitus and glaucoma who was diagnosed with SARS COVID-19 November 3, He presented to the hospital 11/11 with shortness of breath and was diagnosed with acute hypoxic respiratory failure due to COVID-19 pneumonia along with mild gastroenteritis from COVID-19.  Precautions  Precautions Fall  Precaution Comments baseline tremor  Cognition  Arousal/Alertness Awake/alert  Behavior During Therapy WFL for tasks assessed/performed  Overall Cognitive Status Within Functional Limits for tasks assessed  Upper Extremity Assessment  Upper Extremity Assessment Generalized weakness  ADL  General ADL Comments Educated on energy conservation strategues foe ADL adn IADL tasks. Educated on strategies to reduce risk of falls. PT verbalized understanding. Written information reviewed. Pt overall set up on 2L  Bed Mobility  General bed mobility comments pt up in chair, returned to chair  Balance  Overall balance assessment Needs assistance  Sitting-balance support No upper extremity supported;Feet supported  Sitting balance-Leahy Scale Good  Standing balance support No upper extremity supported;Bilateral upper extremity supported;During functional activity  Standing balance-Leahy Scale Fair  Standing balance comment static stand without UE support, RW for amb  Transfers  Overall transfer level Needs assistance  Equipment used Rolling walker (2 wheeled)  Transfers Sit to/from Stand  Sit to Stand Supervision  OT - End of [place] During Treatment Rolling walker;Oxygen (2L)  Activity Tolerance Patient  tolerated treatment well  Patient left in chair;with call bell/phone within reach  Nurse Communication Mobility status;Other (comment) (pt's concerns regarding discharge)  OT Assessment/Plan  OT Plan Discharge plan needs to be updated  OT Visit Diagnosis Unsteadiness on feet (R26.81);Other abnormalities of gait and mobility (R26.89);Muscle weakness (generalized) (M62.81)  OT Frequency (ACUTE ONLY) Min 2X/week  Follow Up Recommendations Home health OT;Supervision - Intermittent  OT Equipment 3 in 1 bedside commode  AM-PAC OT "6 Clicks" Daily Activity Outcome Measure (Version 2)  Help from another person eating meals? 4  Help from another person taking care of personal grooming? 3  Help from another person toileting, which includes using toliet, bedpan, or urinal? 3  Help from another person bathing (including washing, rinsing, drying)? 3  Help from another person to put on and taking off regular upper body clothing? 3  Help from another person to put on and taking off regular lower body clothing? 3  6 Click Score 19  OT Goal Progression  Progress towards OT goals Progressing toward goals  Acute Rehab OT Goals  Patient Stated Goal home  OT Goal Formulation With patient  Time For Goal Achievement 07/13/20  Potential to Achieve Goals Good  ADL Goals  Pt Will Perform Upper Body Dressing with set-up;sitting  Pt Will Perform Lower Body Dressing with min assist;sit to/from stand  Pt Will Transfer to Toilet with modified independence;ambulating;regular height toilet  Pt/caregiver will Perform Home Exercise Program Increased strength;Both right and left upper extremity;With theraband  Additional ADL Goal #1 Pt will recall and apply at least 3 energy conservation strategies to apply to ADL  Additional ADL Goal #2 Pt will self monitor and maintain SpO2 >88% during ADL activities  OT Time Calculation  OT Start Time (ACUTE ONLY) 1112  OT Stop Time (  ACUTE ONLY) 1135  OT Time Calculation (min) 23  min  OT General Charges  $OT Visit 1 Visit  OT Treatments  $Self Care/Home Management  23-37 mins  Maurie Boettcher, OT/L   Acute OT Clinical Specialist [place] Pager [phone] Office [phone]

## 2020-07-03 ENCOUNTER — Telehealth: Payer: Self-pay

## 2020-07-03 LAB — CULTURE, BLOOD (ROUTINE X 2)
Culture: NO GROWTH
Culture: NO GROWTH

## 2020-07-03 NOTE — Telephone Encounter (Signed)
Transition Care Management Unsuccessful Follow-up Telephone Call  Date of discharge and from where:  07/02/2020, [facility]   Attempts:  1st Attempt  Reason for unsuccessful TCM follow-up call:  Left voice message on patient's phone # [phone].  DPR on file to speak to patient's sister, Derek Lowery # [phone].  Call placed to [facility] and the man that answered said that it is a wrong number. He explained that he continues to receive multiple calls for [facility] and he tells the callers that it is a wrong number.  This CM then removed Derek Lowery' number from [place].  Patient needs to schedule virtual follow up appointment with Dr Wynetta Emery

## 2020-07-04 ENCOUNTER — Telehealth: Payer: Self-pay

## 2020-07-04 NOTE — Telephone Encounter (Signed)
Patient called back this morning before letter was sent.  Transition Care Management Follow-up Telephone Call  Date of discharge and from where: 07/02/2020, [facility]  How have you been since you were released from the hospital? He stated he is feeling all right.   Any questions or concerns? No, not at this time  He said he has been staying in his bedroom most of the time.  Has not been wearing a mask when around others.  Explained to him the importance of wearing a mask when he is with others and he said he understood and does have a mask.  He tested positive for COVID 06/20/2020 and he was not sure about remaining quarantined. The hospital has been instructing people to remain quarantined for 3 weeks and he said he will remain at home isolating himself from the others in the home.    Items Reviewed:  Did the pt receive and understand the discharge instructions provided? he said that he probabaly has them but doesn't know where they are,  He has difficulty reading the instructions when he finds them.  He said that his sister can help him.   instructed him to look for the discharge instructions and have his sister review them and call this CM with any questions.   Medications obtained and verified? he said he is not sure what he has.  he tries to manage his medications by himself but is not able to do it alone.  he doesnt know if he has the new medications yet and he may need to get them . reminded him that he has medications he is to stop taking. it is important that he find the medication list with the discharge instructeions and have his sister review the list and check the medications that he has/needs. instructed him to have her call this CM with any questions about the meds.   Other? No   Any new allergies since your discharge? No   Do you have support at home? Yes - his nephew, his sister and her boyfriend live with him.  He said they are all sick.  His nephew has been working  and has tested negative for COVID.  His sister and her boyfriend have not been tested.   Home Care and Equipment/Supplies: Were home health services ordered? yes If so, what is the name of the agency? [facility] Has the agency set up a time to come to the patient's home? This CM called [facility] earlier today and was informed that they had not been able to reach him yet.  He said that a nurse was out to see him yesterday but he was not sure who she was or where she was from. She may be from [facility] Were any new equipment or medical supplies ordered?  Yes: O2 from [place]. room concentrator and portable tanks. he said that he has been using it at 4L /mon as much as possible.  What is the name of the medical supply agency? [facility] Were you able to get the supplies/equipment?yes Do you have any questions related to the use of the equipment or supplies? No   He has a glucometer but said that he is not using it as he is not able to see it  When asked if his sister could assist with this, he said he would need to ask her.   Has walker and cane to use if needed  Functional Questionnaire: (I = Independent and D = Dependent)  ADLs: family provides assistance as needed  Follow up appointments reviewed:   PCP Hospital f/u appt confirmed? Yes  - Dr Wynetta Emery 07/09/2020 @ [facility] f/u appt confirmed? Yes  - ophthalmology - 07/16/2020. Still needs appointment with cardiology.   Are transportation arrangements needed? No  - he said that he contacts family to drive him to appointments.  Explained to him that he should contact his insurance company because they will usually provide transportation to medical appointments  If their condition worsens, is the pt aware to call PCP or go to the Emergency Dept.?  yes  Was the patient provided with contact information for the PCP's office or ED?  He has the phone number for the clinic  Was to pt encouraged to call back with questions or  concerns? yes

## 2020-07-04 NOTE — Telephone Encounter (Signed)
Transition Care Management Unsuccessful Follow-up Telephone Call  Date of discharge and from where:  07/02/2020, [facility]   Attempts:  2nd Attempt  Reason for unsuccessful TCM follow-up call:  Left voice message on # [phone] requesting a call back to this CM.  Call placed to brother, Joneen Caraway # [phone], the number is not in service. Called Russell's other # [phone], messages stated that the call cannot be completed at this time.  Call placed to [facility] to inquire if they have been able to reach the patient.  Spoke to [facility] who stated that they have left messages for the patient but have not heard back from him.  They tried to contact the patient's sister and were informed that was a wrong number. This CM was informed yesterday that the number for his sister was wrong   Letter sent to patient requesting he contact this office to schedule an appointment

## 2020-07-05 ENCOUNTER — Other Ambulatory Visit: Payer: Self-pay | Admitting: Internal Medicine

## 2020-07-05 DIAGNOSIS — I10 Essential (primary) hypertension: Secondary | ICD-10-CM

## 2020-07-09 ENCOUNTER — Ambulatory Visit: Payer: Medicare Other | Attending: Internal Medicine | Admitting: Internal Medicine

## 2020-07-09 ENCOUNTER — Other Ambulatory Visit: Payer: Self-pay

## 2020-07-09 ENCOUNTER — Telehealth: Payer: Self-pay

## 2020-07-09 DIAGNOSIS — I10 Essential (primary) hypertension: Secondary | ICD-10-CM | POA: Diagnosis not present

## 2020-07-09 DIAGNOSIS — J9601 Acute respiratory failure with hypoxia: Secondary | ICD-10-CM | POA: Diagnosis not present

## 2020-07-09 DIAGNOSIS — E1142 Type 2 diabetes mellitus with diabetic polyneuropathy: Secondary | ICD-10-CM | POA: Diagnosis not present

## 2020-07-09 DIAGNOSIS — N179 Acute kidney failure, unspecified: Secondary | ICD-10-CM

## 2020-07-09 DIAGNOSIS — U071 COVID-19: Secondary | ICD-10-CM | POA: Diagnosis not present

## 2020-07-09 DIAGNOSIS — Z09 Encounter for follow-up examination after completed treatment for conditions other than malignant neoplasm: Secondary | ICD-10-CM | POA: Diagnosis not present

## 2020-07-09 DIAGNOSIS — Z794 Long term (current) use of insulin: Secondary | ICD-10-CM

## 2020-07-09 DIAGNOSIS — J1282 Pneumonia due to coronavirus disease 2019: Secondary | ICD-10-CM

## 2020-07-09 MED ORDER — FREESTYLE LIBRE READER DEVI: 1.0000 | Freq: Once | 0 refills | Status: AC

## 2020-07-09 MED ORDER — LANTUS SOLOSTAR 100 UNIT/ML ~~LOC~~ SOPN: 34.0000 [IU] | PEN_INJECTOR | Freq: Every day | SUBCUTANEOUS | 6 refills | Status: DC

## 2020-07-09 MED ORDER — FREESTYLE LIBRE SENSOR SYSTEM MISC: 12 refills | Status: DC

## 2020-07-09 MED ORDER — INSULIN LISPRO (1 UNIT DIAL) 100 UNIT/ML (KWIKPEN): 10.0000 [IU] | PEN_INJECTOR | Freq: Three times a day (TID) | SUBCUTANEOUS | 6 refills | Status: DC

## 2020-07-09 NOTE — Progress Notes (Signed)
Virtual Visit via Telephone Note  I connected with Derek Lowery on 07/09/20 at 11:15 a.m by telephone and verified that I am speaking with the correct person using two identifiers.  Location: Patient: home Provider: office   I discussed the limitations, risks, security and privacy concerns of performing an evaluation and management service by telephone and the availability of in person appointments. I also discussed with the patient that there may be a patient responsible charge related to this service. The patient expressed understanding and agreed to proceed.   History of Present Illness: hx of DM type 2 with retinopathy(laser treatments by Dr. Beather Arbour neuropathy, and microalbuminuria,HTN, CAD with stent OM2 in 2012, glaucoma (blind LT eye) and HL.  Last seen 10/2019.  Purpose of today's visit is transition of care.  Date of hospitalization: 11/11-15/2021 Date of phone call from case worker: 07/04/2020  Patient hospitalized with hypoxic respiratory failure secondary to Covid pneumonia after being diagnosed on 06/20/2020.  Course was also complicated with [id number] gastroenteritis.  He was not vaccinated.  Treated with IV steroids, remdesivir and Baricitinib.  Symptoms improved with patient still had to be discharged on 2 to 3 L of O2 with ambulation.  He was also sent home on a Medrol pack and a rescue inhaler.  Of note, patient sent home on Eliquis which he told him he was placed on prior to hospitalization.  He experienced AKI with previous creatinine of 1.5 prior to hospitalization.  At the time of discharge creatinine was 1.7 with GFR of 45.  Request on hospital discharge was for chest x-ray to be repeated in 1 week and CBC and chemistry to be repeated in about 2 weeks.  Today: SOB better, cough almost resolved.  No fever. Using O2 mainly at nights. Feels he does okay without it during the day.  Pox 95-96 with rest without oxygen.  Has not checked his O2 level with ambulation  but states that he has been walking outside sometimes.    DM Lab Results  Component Value Date   HGBA1C 10.9 (H) 06/28/2020  Not checking BS, hard to prick self with the lancets.  Never got Libre meter which I prescribed on last visit Taking Lantus 34 units and Humalog 10 units TID with meals. No hypoglycemic episodes that he can feel.  Continues to be followed by Dr. [name] for diabetic retinopathy.  HTN/CAD: on Norvasc and Furosemide.  Lisinopril and HCTZ were discontinued during hospitalization due to AKI.   BP this a.m was 158/70.  Yesterday was 138/60.   Little swelling in legs.  No CP  Reports being on Eliquis before going to hosp.  States he was placed on it by a nurse who comes out from [facility].  He is not sure why he was placed on it.  He denies having any blood clots or being told that his heart was going into abnormal rhythm called atrial fibrillation.    He was discharged home from the hospital on Eliquis. Outpatient Encounter Medications as of 07/09/2020  Medication Sig Note  . albuterol (VENTOLIN HFA) 108 (90 Base) MCG/ACT inhaler Inhale 1-2 puffs into the lungs every 6 (six) hours as needed for wheezing or shortness of breath.    Marland Kitchen amLODipine (NORVASC) 10 MG tablet Take 1 tablet (10 mg total) by mouth daily.   Marland Kitchen ammonium lactate (AMLACTIN) 12 % lotion Apply to both feet twice daily for dry skin. (Patient taking differently: Apply 1 application topically 2 (two) times daily as needed for  dry skin. Apply to both feet twice daily for dry skin.)   . aspirin EC 81 MG tablet Take 1 tablet (81 mg total) by mouth daily.   Marland Kitchen atorvastatin (LIPITOR) 40 MG tablet TAKE 1 TABLET BY MOUTH EVERY DAY (Patient taking differently: Take 40 mg by mouth daily. )   . blood glucose meter kit and supplies KIT Dispense based on patient and insurance preference. Use up to four times daily as directed. One Touch Verio   . brimonidine (ALPHAGAN) 0.2 % ophthalmic solution Place 1 drop into the right  eye 2 (two) times daily.  06/29/2020: No RF History  . Continuous Blood Gluc Sensor (FREESTYLE LIBRE SENSOR SYSTEM) MISC Change sensor Q 2 wks   . dorzolamide (TRUSOPT) 2 % ophthalmic solution Place 1 drop into the right eye 2 (two) times daily. 06/29/2020: LF on 08-19-19  . ELIQUIS 5 MG TABS tablet Take 5 mg by mouth 2 (two) times daily.   . furosemide (LASIX) 20 MG tablet TAKE 1 TABLET (20 MG TOTAL) BY MOUTH DAILY. STOP LISINOPRIL/HCTZ   . glucose blood (ACCU-CHEK AVIVA PLUS) test strip Use as instructed for 3 times daily testing of blood sugar. E11.9   . glucose monitoring kit (FREESTYLE) monitoring kit 1 each by Does not apply route as needed for other.   . insulin glargine (LANTUS SOLOSTAR) 100 UNIT/ML Solostar Pen INJECT 34 UNITS INTO THE SKIN DAILY. Please make PCP appointment. (Patient taking differently: Inject 34 Units into the skin at bedtime. )   . insulin lispro (HUMALOG KWIKPEN) 100 UNIT/ML KwikPen 10 units TID with meals (Patient taking differently: Inject 10 Units into the skin 3 (three) times daily with meals. )   . Insulin Pen Needle (BD PEN NEEDLE NANO U/F) 32G X 4 MM MISC USE AS DIRECTED 3 TIMES A DAY. Please schedule an appointment for additional refills.   . Lancets MISC Use as directed.  Accu chek 2   . methylPREDNISolone (MEDROL DOSEPAK) 4 MG TBPK tablet follow package directions   . Multiple Vitamins-Minerals (MULTIVITAMIN WITH MINERALS) tablet Take 1 tablet by mouth daily.   . RHOPRESSA 0.02 % SOLN Place 1 drop into the right eye at bedtime. 06/29/2020: [facility], this was last [facility] on 04/24/2020 for a 90-day's supply  . timolol (BETIMOL) 0.5 % ophthalmic solution Place 1 drop into the right eye 2 (two) times daily. 06/29/2020: [facility], this has not been filled since 2020  . VYZULTA 0.024 % SOLN Place 1 drop into the right eye daily.  06/29/2020: [facility], this has not been filled since 09/2019   No facility-administered encounter medications on file as of 07/09/2020.       Observations/Objective:   Chemistry      Component Value Date/Time   NA 135 07/02/2020 0245   NA 141 07/12/2019 1042   K 4.1 07/02/2020 0245   CL 100 07/02/2020 0245   CO2 26 07/02/2020 0245   BUN 65 (H) 07/02/2020 0245   BUN 25 07/12/2019 1042   CREATININE 1.70 (H) 07/02/2020 0245   CREATININE 0.80 09/02/2016 0902      Component Value Date/Time   CALCIUM 8.6 (L) 07/02/2020 0245   ALKPHOS 95 07/02/2020 0245   AST 35 07/02/2020 0245   ALT 34 07/02/2020 0245   BILITOT 0.8 07/02/2020 0245   BILITOT 0.6 07/12/2019 1042     Lab Results  Component Value Date   WBC 10.9 (H) 07/02/2020   HGB 11.3 (L) 07/02/2020   HCT 34.2 (L) 07/02/2020  MCV 85.9 07/02/2020   PLT 361 07/02/2020    Assessment and Plan: 1. Hospital discharge follow-up   2. Type 2 diabetes mellitus with diabetic polyneuropathy, with long-term current use of insulin [facility]) Patient will try checking his blood sugars with the meter and lancets that he currently has.  I will resubmit for the libre meter and have a clinical pharmacist check to see whether his insurance covers for it. - Comprehensive metabolic panel; Future - insulin lispro (HUMALOG KWIKPEN) 100 UNIT/ML KwikPen; Inject 10 Units into the skin 3 (three) times daily with meals.  Dispense: 15 mL; Refill: 6 - insulin glargine (LANTUS SOLOSTAR) 100 UNIT/ML Solostar Pen; Inject 34 Units into the skin at bedtime.  Dispense: 15 mL; Refill: 6  3. Essential hypertension Reported blood pressure today was not at goal but was close to goal on reading from yesterday.  He will continue current medications.  I will bring him for an in person visit in 1 month and recheck his chemistry in the event we need to adjust his medications.  Continue to monitor blood pressure at home and bring in readings on follow-up visit  4. Pneumonia due to COVID-19 virus Clinically improved. - DG Chest 2 View; Future - CBC; Future  5. Acute respiratory failure with hypoxia  (HCC) Clinically improved.  On his in person visit we will check to see whether he still requires O2.  Advised patient to check his oxygen level when he is ambulatory and not wearing the O2.  Goal is to keep the oxygen level above 90.  If he finds he is dropping below that, I have encouraged him to use the O2 with ambulation  6. AKI (acute kidney injury) ([place])    Follow Up Instructions: 4 wks   I discussed the assessment and treatment plan with the patient. The patient was provided an opportunity to ask questions and all were answered. The patient agreed with the plan and demonstrated an understanding of the instructions.   The patient was advised to call back or seek an in-person evaluation if the symptoms worsen or if the condition fails to improve as anticipated.  I provided 20 minutes of non-face-to-face time during this encounter.   Karle Plumber, MD

## 2020-07-09 NOTE — Telephone Encounter (Signed)
At request of Dr Wynetta Emery, call placed to [facility] # [phone] to inquire about order for eliquis.  The patient was not sure why he was started on it.  This CM spoke to [facility] who said that he was COVID + with elevated d-dimer and CRP and they start patients on 1/2 dose of eliquis twice daily through the acute phase of the illness.   She said that the labs were done 06/25/2020 and the results that they received are not in Epic as they are not able to load results into Epic. She will fax the results to Dr Wynetta Emery.

## 2020-07-09 NOTE — Progress Notes (Signed)
Pt states he is feeling good   Pt states he still has a little cough

## 2020-07-10 ENCOUNTER — Other Ambulatory Visit: Payer: Self-pay

## 2020-07-10 MED ORDER — ELIQUIS 5 MG PO TABS
5.0000 mg | ORAL_TABLET | Freq: Two times a day (BID) | ORAL | 0 refills | Status: DC
Start: 2020-07-10 — End: 2020-07-11

## 2020-07-10 NOTE — Telephone Encounter (Signed)
Contacted pt and went over Dr. Wynetta Emery message pt states he has 5 pills left. Went and asked Dr. Margarita Rana if it's okay for me to refill rx and she gave the okay. Made pt aware. Pt doesn't have any questions or concerns

## 2020-07-11 ENCOUNTER — Other Ambulatory Visit: Payer: Self-pay | Admitting: Internal Medicine

## 2020-07-11 ENCOUNTER — Other Ambulatory Visit: Payer: Self-pay

## 2020-07-11 ENCOUNTER — Telehealth: Payer: Self-pay | Admitting: Internal Medicine

## 2020-07-11 DIAGNOSIS — E1165 Type 2 diabetes mellitus with hyperglycemia: Secondary | ICD-10-CM

## 2020-07-11 DIAGNOSIS — IMO0002 Reserved for concepts with insufficient information to code with codable children: Secondary | ICD-10-CM

## 2020-07-11 MED ORDER — DEXCOM G6 SENSOR MISC: 1.0000 | Freq: Every day | 11 refills | Status: DC

## 2020-07-11 MED ORDER — ELIQUIS 5 MG PO TABS
5.0000 mg | ORAL_TABLET | Freq: Two times a day (BID) | ORAL | 0 refills | Status: DC
Start: 2020-07-11 — End: 2020-07-20

## 2020-07-11 MED ORDER — DEXCOM G6 RECEIVER DEVI: 1.0000 | Freq: Every day | 0 refills | Status: DC

## 2020-07-11 MED ORDER — DEXCOM G6 TRANSMITTER MISC: 1.0000 | Freq: Every day | 4 refills | Status: DC

## 2020-07-11 MED ORDER — BD PEN NEEDLE NANO U/F 32G X 4 MM MISC: 3 refills | Status: DC

## 2020-07-11 NOTE — Telephone Encounter (Signed)
-----   Message from Tresa Endo, [place] sent at 07/11/2020 10:06 AM EST ----- Yes ma'am. I recommend to send there. Some insurances require those to be sent to DME facilities, however, [employer] should fill [facility]. The insurances that require DME facilities are [place] and [place].  ----- Message ----- From: Ladell Pier, MD Sent: 07/10/2020   9:14 PM EST To: Tresa Endo, RPH-CPP  Should I send rxn to [facility] which is the pharmacy he uses? ----- Message ----- From: Tresa Endo, RPH-CPP Sent: 07/10/2020   9:25 AM EST To: Ladell Pier, MD  Try Dexcom G6. I've had better luck with [employer]. They will still likely require a PA.   ----- Message ----- From: Ladell Pier, MD Sent: 07/09/2020   1:46 PM EST To: Tresa Endo, RPH-CPP  I am trying to get this patient a libre meter.  He has poor vision due to diabetic retinopathy and is having problems sticking himself with lancets.  Is there a way to check whether or not his insurance covers the [place] meter?

## 2020-07-11 NOTE — Telephone Encounter (Signed)
[facility]. We reviewed the rejection. [employer] does cover Dexcom G6 but is contracted with [employer] (DME/supplies provider). I contacted [employer] at [phone]. The representative informed me that the patient must create a profile, I cannot do this on pt's behalf, and request a request be faxed to our office.   I called Mr. Matich. I gave him [employer] information to contact them and create a profile. He verbalized understanding of this. I explained that once he does this, [employer] will fax [place] a fill request for the Dexcom. He verbalized understanding.

## 2020-07-11 NOTE — Telephone Encounter (Signed)
Contacted pt to go over Dr. Wynetta Emery message. Pt asked for pen needles to be refilled. Sent in refills.    Derek Lowery Pt mentioned that he [facility] stating that they informed him that his insurance will not cover the Dexcom would you be able to look into this?

## 2020-07-16 ENCOUNTER — Encounter (INDEPENDENT_AMBULATORY_CARE_PROVIDER_SITE_OTHER): Payer: Medicare Other | Admitting: Ophthalmology

## 2020-07-18 ENCOUNTER — Ambulatory Visit (INDEPENDENT_AMBULATORY_CARE_PROVIDER_SITE_OTHER): Payer: Medicare Other | Admitting: Ophthalmology

## 2020-07-18 ENCOUNTER — Other Ambulatory Visit: Payer: Self-pay

## 2020-07-18 ENCOUNTER — Encounter (INDEPENDENT_AMBULATORY_CARE_PROVIDER_SITE_OTHER): Payer: Self-pay | Admitting: Ophthalmology

## 2020-07-18 DIAGNOSIS — E113511 Type 2 diabetes mellitus with proliferative diabetic retinopathy with macular edema, right eye: Secondary | ICD-10-CM | POA: Diagnosis not present

## 2020-07-18 MED ORDER — BEVACIZUMAB CHEMO INJECTION 1.25MG/0.05ML SYRINGE FOR KALEIDOSCOPE
1.2500 mg | INTRAVITREAL | Status: AC | PRN
  Administered 2020-07-18: 1.25 mg via INTRAVITREAL

## 2020-07-18 NOTE — Progress Notes (Signed)
07/18/2020     CHIEF COMPLAINT Patient presents for Retina Follow Up   HISTORY OF PRESENT ILLNESS: Derek Lowery is a 64 y.o. male who presents to the clinic today for:   HPI    Retina Follow Up    Patient presents with  Diabetic Retinopathy.  In right eye.  This started 8 weeks ago.  Severity is mild.  Duration of 8 weeks.  Since onset it is gradually worsening.          Comments    8 Week F/U OD, poss Avastin OD  Pt c/o decreased VA OD. Pt c/o increased blur OD. No other new symptoms reported.  LBS: does not recall       Last edited by Rockie Neighbours, [place] on 07/18/2020  9:41 AM. (History)      Referring physician: Ladell Pier, MD [place],  [place] [address]  HISTORICAL INFORMATION:   Selected notes from the MEDICAL RECORD NUMBER    Lab Results  Component Value Date   HGBA1C 10.9 (H) 06/28/2020     CURRENT MEDICATIONS: Current Outpatient Medications (Ophthalmic Drugs)  Medication Sig  . brimonidine (ALPHAGAN) 0.2 % ophthalmic solution Place 1 drop into the right eye 2 (two) times daily.   . dorzolamide (TRUSOPT) 2 % ophthalmic solution Place 1 drop into the right eye 2 (two) times daily.  . RHOPRESSA 0.02 % SOLN Place 1 drop into the right eye at bedtime.  . timolol (BETIMOL) 0.5 % ophthalmic solution Place 1 drop into the right eye 2 (two) times daily.  Marland Kitchen VYZULTA 0.024 % SOLN Place 1 drop into the right eye daily.    No current facility-administered medications for this visit. (Ophthalmic Drugs)   Current Outpatient Medications (Other)  Medication Sig  . albuterol (VENTOLIN HFA) 108 (90 Base) MCG/ACT inhaler Inhale 1-2 puffs into the lungs every 6 (six) hours as needed for wheezing or shortness of breath.   Marland Kitchen amLODipine (NORVASC) 10 MG tablet Take 1 tablet (10 mg total) by mouth daily.  Marland Kitchen ammonium lactate (AMLACTIN) 12 % lotion Apply to both feet twice daily for dry skin. (Patient taking differently: Apply 1 application topically 2  (two) times daily as needed for dry skin. Apply to both feet twice daily for dry skin.)  . aspirin EC 81 MG tablet Take 1 tablet (81 mg total) by mouth daily.  Marland Kitchen atorvastatin (LIPITOR) 40 MG tablet TAKE 1 TABLET BY MOUTH EVERY DAY (Patient taking differently: Take 40 mg by mouth daily. )  . blood glucose meter kit and supplies KIT Dispense based on patient and insurance preference. Use up to four times daily as directed. One Touch Verio  . Continuous Blood Gluc Receiver (DEXCOM G6 RECEIVER) DEVI 1 Device by Does not apply route daily.  . Continuous Blood Gluc Sensor (DEXCOM G6 SENSOR) MISC 1 packet by Does not apply route daily.  . Continuous Blood Gluc Transmit (DEXCOM G6 TRANSMITTER) MISC 1 packet by Does not apply route daily.  Marland Kitchen ELIQUIS 5 MG TABS tablet Take 1 tablet (5 mg total) by mouth 2 (two) times daily.  . furosemide (LASIX) 20 MG tablet TAKE 1 TABLET (20 MG TOTAL) BY MOUTH DAILY. STOP LISINOPRIL/HCTZ  . glucose blood (ACCU-CHEK AVIVA PLUS) test strip Use as instructed for 3 times daily testing of blood sugar. E11.9  . glucose monitoring kit (FREESTYLE) monitoring kit 1 each by Does not apply route as needed for other.  . insulin glargine (LANTUS SOLOSTAR) 100  UNIT/ML Solostar Pen Inject 34 Units into the skin at bedtime.  . insulin lispro (HUMALOG KWIKPEN) 100 UNIT/ML KwikPen Inject 10 Units into the skin 3 (three) times daily with meals.  . Insulin Pen Needle (BD PEN NEEDLE NANO U/F) 32G X 4 MM MISC USE AS DIRECTED 3 TIMES A DAY. Please schedule an appointment for additional refills.  . Lancets MISC Use as directed.  Accu chek 2  . methylPREDNISolone (MEDROL DOSEPAK) 4 MG TBPK tablet follow package directions  . Multiple Vitamins-Minerals (MULTIVITAMIN WITH MINERALS) tablet Take 1 tablet by mouth daily.   No current facility-administered medications for this visit. (Other)      REVIEW OF SYSTEMS:    ALLERGIES No Known Allergies  PAST MEDICAL HISTORY Past Medical History:    Diagnosis Date  . Arthritis   . CAD (coronary artery disease)    NSTEMI 06/2011:  LHC 07/18/11: Proximal diagonal 60%, distal LAD with a diabetic appearance and 60% stenosis, OM2 with an occluded superior branch and an inferior branch with 90%, EF 55% with inferior hypokinesis.  PCI: Promus DES to the OM2 inferior branch.  This vessel provides collaterals to the superior branch which remained occluded.  Echocardiogram 07/18/11: EF 60%, normal wall motion.  . Cataract   . Essential hypertension, benign   . Glaucoma   . Hypercholesteremia   . Internal hemorrhoids   . Noncompliance   . Type 2 diabetes mellitus ([place]) 1990   Past Surgical History:  Procedure Laterality Date  . CATARACT EXTRACTION Right 2019   Dr. Katy Fitch  . CHOLECYSTECTOMY N/A 09/21/2017   Procedure: LAPAROSCOPIC CHOLECYSTECTOMY WITH INTRAOPERATIVE CHOLANGIOGRAM;  Surgeon: Michael Boston, MD;  Location: [facility];  Service: General;  Laterality: N/A;  . COLONOSCOPY  2000   Dr. Collene Mares  . EYE SURGERY    . IRRIGATION AND DEBRIDEMENT ABSCESS Left 05/05/2014   Procedure: IRRIGATION AND DEBRIDEMENT ABSCESS left buttock;  Surgeon: Michael Boston, MD;  Location: [facility];  Service: General;  Laterality: Left;  . LEFT HEART CATHETERIZATION WITH CORONARY ANGIOGRAM N/A 07/18/2011   Procedure: LEFT HEART CATHETERIZATION WITH CORONARY ANGIOGRAM;  Surgeon: Hillary Bow, MD;  Location: [facility] CATH LAB;  Service: Cardiovascular;  Laterality: N/A;  . PERCUTANEOUS CORONARY STENT INTERVENTION (PCI-S)  07/18/2011   Procedure: PERCUTANEOUS CORONARY STENT INTERVENTION (PCI-S);  Surgeon: Hillary Bow, MD;  Location: [facility] CATH LAB;  Service: Cardiovascular;;    FAMILY HISTORY Family History  Problem Relation Age of Onset  . Coronary artery disease Father        Developed in his 59s  . Kidney disease Father   . Diabetes Father   . Melanoma Father   . Rectal cancer Father   . Heart failure Mother   . Hypertension Mother   . Diabetes Sister   .  Diabetes Brother   . Colon cancer Neg Hx   . Esophageal cancer Neg Hx   . Stomach cancer Neg Hx     SOCIAL HISTORY Social History   Tobacco Use  . Smoking status: Never Smoker  . Smokeless tobacco: Never Used  Vaping Use  . Vaping Use: Never used  Substance Use Topics  . Alcohol use: No  . Drug use: No         OPHTHALMIC EXAM:  Base Eye Exam    Visual Acuity (ETDRS)      Right Left   Dist [place] 20/30 -1 NLP       Tonometry (Tonopen, 9:42 AM)      Right Left  Pressure 12 27       Pupils      Dark Light Shape React APD   Right 2 2 Irregular Minimal None   Left 3 3 Round Minimal +1       Visual Fields (Counting fingers)      Left Right   Restrictions Total superior temporal, inferior temporal, superior nasal, inferior nasal deficiencies Total superior temporal, inferior temporal deficiencies       Extraocular Movement      Right Left    Full Full       Neuro/Psych    Oriented x3: Yes   Mood/Affect: Normal       Dilation    Right eye: 1.0% Mydriacyl, 2.5% Phenylephrine @ 9:44 AM        Slit Lamp and Fundus Exam    External Exam      Right Left   External Normal Normal       Slit Lamp Exam      Right Left   Lids/Lashes Normal Normal   Conjunctiva/Sclera White and quiet, no exposure of tube White and quiet   Cornea Clear Clear   Anterior Chamber ,, Tube 12 meridian Deep   Iris Old NVI large trunks, no signs of new  NVI Old neovascularization of the iris 360 degrees, stable nonprogressive   Lens Posterior chamber intraocular lens 3+ Nuclear sclerosis   Anterior Vitreous Normal        Fundus Exam      Right Left   Posterior Vitreous Posterior vitreous detachment No view   Disc Normal Normal   C/D Ratio 0.7    Macula Microaneurysms, no macular thickening, no clinically significant macular edema, Retinal pigment epithelial atrophy    Vessels Proliferative diabetic retinopathy, quiet    Periphery  good panretinal photocoagulation,            IMAGING AND PROCEDURES  Imaging and Procedures for 07/18/20  OCT, Retina - OU - Both Eyes       Right Eye Quality was good. Scan locations included subfoveal. Central Foveal Thickness: 271. Progression has been stable. Findings include abnormal foveal contour, outer retinal atrophy, vitreomacular adhesion , retinal drusen , no IRF, no SRF.   Notes No view OS         Intravitreal Injection, Pharmacologic Agent - OD - Right Eye       Time Out 07/18/2020. 10:24 AM. Confirmed correct patient, procedure, site, and patient consented.   Anesthesia Topical anesthesia was used. Anesthetic medications included [place] 3.5%.   Procedure Preparation included Tobramycin 0.3%, 10% betadine to eyelids, 5% betadine to ocular surface. A 30 gauge needle was used.   Injection:  1.25 mg Bevacizumab (AVASTIN) SOLN   NDC: 70360-001-02, Lot: [id number]   Route: Intravitreal, Site: Right Eye, Waste: 0 mg  Post-op Post injection exam found visual acuity of at least counting fingers. The patient tolerated the procedure well. There were no complications. The patient received written and verbal post procedure care education. Post injection medications were not given.                 ASSESSMENT/PLAN:  Proliferative diabetic retinopathy of right eye with macular edema associated with type 2 diabetes mellitus (HCC) OD continues to be controlled CSME with intravitreal Avastin, moreover multiple recurrences of iris rubeosis controlled with injections, currently at 8-week interval      ICD-10-CM   1. Proliferative diabetic retinopathy of right eye with macular edema associated with type 2 diabetes mellitus ([place])  W38.9373 OCT, Retina - OU - Both Eyes    Intravitreal Injection, Pharmacologic Agent - OD - Right Eye    Bevacizumab (AVASTIN) SOLN 1.25 mg    1.  Stable proliferative diabetic retinopathy and CSME on intravitreal Avastin, currently at 8-week interval,    2.  History of multiple  recurrences of iris rubeosis threatening globe loss and vision loss, also stabilized and maintained by [place] since on intravitreal Avastin  3.  Ophthalmic Meds Ordered this visit:  Meds ordered this encounter  Medications  . Bevacizumab (AVASTIN) SOLN 1.25 mg       Return in about 8 weeks (around 09/12/2020) for dilate, OD, AVASTIN OCT.  Patient Instructions  Patient instructed to contact the office promptly for new onset visual acuity declines    Explained the diagnoses, plan, and follow up with the patient and they expressed understanding.  Patient expressed understanding of the importance of proper follow up care.   Clent Demark Kaylin Schellenberg M.D. Diseases & Surgery of the Retina and Vitreous Retina & Diabetic [place] 07/18/20     Abbreviations: M myopia (nearsighted); A astigmatism; H hyperopia (farsighted); P presbyopia; Mrx spectacle prescription;  CTL contact lenses; OD right eye; OS left eye; OU both eyes  XT exotropia; ET esotropia; PEK punctate epithelial keratitis; PEE punctate epithelial erosions; DES dry eye syndrome; MGD meibomian gland dysfunction; ATs artificial tears; PFAT's preservative free artificial tears; [place] nuclear sclerotic cataract; PSC posterior subcapsular cataract; ERM epi-retinal membrane; PVD posterior vitreous detachment; RD retinal detachment; DM diabetes mellitus; DR diabetic retinopathy; NPDR non-proliferative diabetic retinopathy; PDR proliferative diabetic retinopathy; CSME clinically significant macular edema; DME diabetic macular edema; dbh dot blot hemorrhages; CWS cotton wool spot; POAG primary open angle glaucoma; C/D cup-to-disc ratio; HVF humphrey visual field; GVF goldmann visual field; OCT optical coherence tomography; IOP intraocular pressure; BRVO Branch retinal vein occlusion; CRVO central retinal vein occlusion; CRAO central retinal artery occlusion; BRAO branch retinal artery occlusion; RT retinal tear; SB scleral buckle; PPV pars plana vitrectomy;  VH Vitreous hemorrhage; PRP panretinal laser photocoagulation; IVK intravitreal kenalog; VMT vitreomacular traction; MH Macular hole;  NVD neovascularization of the disc; NVE neovascularization elsewhere; AREDS age related eye disease study; ARMD age related macular degeneration; POAG primary open angle glaucoma; EBMD epithelial/anterior basement membrane dystrophy; ACIOL anterior chamber intraocular lens; IOL intraocular lens; PCIOL posterior chamber intraocular lens; Phaco/IOL phacoemulsification with intraocular lens placement; [place] photorefractive keratectomy; LASIK laser assisted in situ keratomileusis; HTN hypertension; DM diabetes mellitus; COPD chronic obstructive pulmonary disease

## 2020-07-18 NOTE — Patient Instructions (Signed)
Patient instructed to contact the office promptly for new onset visual acuity declines

## 2020-07-18 NOTE — Assessment & Plan Note (Signed)
OD continues to be controlled CSME with intravitreal Avastin, moreover multiple recurrences of iris rubeosis controlled with injections, currently at 8-week interval

## 2020-07-20 ENCOUNTER — Other Ambulatory Visit: Payer: Self-pay | Admitting: Internal Medicine

## 2020-07-20 DIAGNOSIS — I10 Essential (primary) hypertension: Secondary | ICD-10-CM

## 2020-07-27 ENCOUNTER — Ambulatory Visit (INDEPENDENT_AMBULATORY_CARE_PROVIDER_SITE_OTHER): Payer: Medicare Other | Admitting: Podiatry

## 2020-07-27 ENCOUNTER — Other Ambulatory Visit: Payer: Self-pay

## 2020-07-27 ENCOUNTER — Encounter: Payer: Self-pay | Admitting: Podiatry

## 2020-07-27 DIAGNOSIS — M79674 Pain in right toe(s): Secondary | ICD-10-CM

## 2020-07-27 DIAGNOSIS — E1142 Type 2 diabetes mellitus with diabetic polyneuropathy: Secondary | ICD-10-CM

## 2020-07-27 DIAGNOSIS — M79675 Pain in left toe(s): Secondary | ICD-10-CM | POA: Diagnosis not present

## 2020-07-27 DIAGNOSIS — L84 Corns and callosities: Secondary | ICD-10-CM

## 2020-07-27 DIAGNOSIS — B351 Tinea unguium: Secondary | ICD-10-CM | POA: Diagnosis not present

## 2020-08-03 NOTE — Progress Notes (Signed)
Subjective:  Patient ID: Derek Lowery, male    DOB: 1956/05/24,  MRN: [id number]  64 y.o. male presents with at risk foot care with history of diabetic neuropathy and painful callus(es) plantar aspect of the right foot and painful thick toenails that are difficult to trim. Painful toenails interfere with ambulation. Aggravating factors include wearing enclosed shoe gear. Pain is relieved with periodic professional debridement. Painful calluses are aggravated when weightbearing with and without shoegear. Pain is relieved with periodic professional debridement.   He voices no new pedal problems on today's visit. Review of Systems: Negative except as noted in the HPI.  Past Medical History:  Diagnosis Date  . Arthritis   . CAD (coronary artery disease)    NSTEMI 06/2011:  LHC 07/18/11: Proximal diagonal 60%, distal LAD with a diabetic appearance and 60% stenosis, OM2 with an occluded superior branch and an inferior branch with 90%, EF 55% with inferior hypokinesis.  PCI: Promus DES to the OM2 inferior branch.  This vessel provides collaterals to the superior branch which remained occluded.  Echocardiogram 07/18/11: EF 60%, normal wall motion.  . Cataract   . Essential hypertension, benign   . Glaucoma   . Hypercholesteremia   . Internal hemorrhoids   . Noncompliance   . Type 2 diabetes mellitus ([place]) 1990   Past Surgical History:  Procedure Laterality Date  . CATARACT EXTRACTION Right 2019   Dr. Katy Fitch  . CHOLECYSTECTOMY N/A 09/21/2017   Procedure: LAPAROSCOPIC CHOLECYSTECTOMY WITH INTRAOPERATIVE CHOLANGIOGRAM;  Surgeon: Michael Boston, MD;  Location: [facility];  Service: General;  Laterality: N/A;  . COLONOSCOPY  2000   Dr. Collene Mares  . EYE SURGERY    . IRRIGATION AND DEBRIDEMENT ABSCESS Left 05/05/2014   Procedure: IRRIGATION AND DEBRIDEMENT ABSCESS left buttock;  Surgeon: Michael Boston, MD;  Location: [facility];  Service: General;  Laterality: Left;  . LEFT HEART CATHETERIZATION WITH CORONARY  ANGIOGRAM N/A 07/18/2011   Procedure: LEFT HEART CATHETERIZATION WITH CORONARY ANGIOGRAM;  Surgeon: Hillary Bow, MD;  [facility] C[facility] LAB;  Service: Cardiovascular;  Laterality: N/A;  . PERCUTANEOUS CORONARY STENT INTERVENTION (PCI-S)  07/18/2011   Procedure: PERCUTANEOUS CORONARY STENT INTERVENTION (PCI-S);  Surgeon: Hillary Bow, MD;  Location: [facility] CATH LAB;  Service: Cardiovascular;;   Patient Active Problem List   Diagnosis Date Noted  . Pneumatouria 06/28/2020  . Acute respiratory failure with hypoxia ([place]) 06/28/2020  . Pneumonia due to COVID-19 virus 06/28/2020  . Proliferative diabetic retinopathy of right eye with macular edema associated with type 2 diabetes mellitus ([place]) 11/30/2019  . Rubeosis iridis of right eye 11/30/2019  . Diabetic visual loss better eye: severe vision impairment; lesser eye: blind, without macular edema, with proliferative retinopathy, associated with type 2 diabetes mellitus ([place]) 11/30/2019  . Glaucoma with pupillary seclusion, unspecified laterality, severe stage 11/30/2019  . Trigger middle finger of right hand 10/17/2019  . Pain due to onychomycosis of toenails of both feet 08/07/2019  . Educated about COVID-19 virus infection 12/02/2018  . RBBB 12/02/2018  . Benign paroxysmal positional vertigo 06/25/2018  . Pre-ulcerative corn or callous 10/13/2017  . Anemia 10/13/2017  . Chronic diarrhea 06/09/2017  . Microalbuminuria 03/07/2017  . Dry skin 03/05/2017  . Glaucoma 03/05/2017  . Legally blind 03/05/2017  . Retinopathy due to secondary diabetes ([place]) 02/06/2016  . Diabetic polyneuropathy associated with type 2 diabetes mellitus ([place]) 01/12/2015  . Fournier's gangrene into true pelvis s/p I&D 05/05/2014 05/05/2014  . Type 2 diabetes mellitus with complication, with long-term current  use of insulin ([place]) 07/17/2011  . HLD (hyperlipidemia) 07/17/2011  . Coronary artery disease of native artery of native heart with stable angina pectoris  ([place]) 07/17/2011  . Essential hypertension 07/17/2011    Current Outpatient Medications:  .  albuterol (VENTOLIN HFA) 108 (90 Base) MCG/ACT inhaler, Inhale 1-2 puffs into the lungs every 6 (six) hours as needed for wheezing or shortness of breath. , Disp: , Rfl:  .  amLODipine (NORVASC) 10 MG tablet, Take 1 tablet (10 mg total) by mouth daily., Disp: 30 tablet, Rfl: 0 .  ammonium lactate (AMLACTIN) 12 % lotion, Apply to both feet twice daily for dry skin. (Patient taking differently: Apply 1 application topically 2 (two) times daily as needed for dry skin. Apply to both feet twice daily for dry skin.), Disp: 400 g, Rfl: 6 .  aspirin EC 81 MG tablet, Take 1 tablet (81 mg total) by mouth daily., Disp: 30 tablet, Rfl: 4 .  atorvastatin (LIPITOR) 40 MG tablet, TAKE 1 TABLET BY MOUTH EVERY DAY (Patient taking differently: Take 40 mg by mouth daily. ), Disp: 90 tablet, Rfl: 1 .  blood glucose meter kit and supplies KIT, Dispense based on patient and insurance preference. Use up to four times daily as directed. One Touch Verio, Disp: 1 each, Rfl: 0 .  brimonidine (ALPHAGAN) 0.2 % ophthalmic solution, Place 1 drop into the right eye 2 (two) times daily. , Disp: , Rfl:  .  Continuous Blood Gluc Receiver (DEXCOM G6 RECEIVER) DEVI, 1 Device by Does not apply route daily., Disp: 1 each, Rfl: 0 .  Continuous Blood Gluc Sensor (DEXCOM G6 SENSOR) MISC, 1 packet by Does not apply route daily., Disp: 3 each, Rfl: 11 .  Continuous Blood Gluc Transmit (DEXCOM G6 TRANSMITTER) MISC, 1 packet by Does not apply route daily., Disp: 1 each, Rfl: 4 .  dorzolamide (TRUSOPT) 2 % ophthalmic solution, Place 1 drop into the right eye 2 (two) times daily., Disp: , Rfl:  .  ELIQUIS 5 MG TABS tablet, TAKE 1 TABLET BY MOUTH TWICE A DAY, Disp: 60 tablet, Rfl: 0 .  furosemide (LASIX) 20 MG tablet, TAKE 1 TABLET (20 MG TOTAL) BY MOUTH DAILY. STOP LISINOPRIL/HCTZ, Disp: 90 tablet, Rfl: 0 .  glucose blood (ACCU-CHEK AVIVA PLUS) test  strip, Use as instructed for 3 times daily testing of blood sugar. E11.9, Disp: 100 each, Rfl: 6 .  glucose monitoring kit (FREESTYLE) monitoring kit, 1 each by Does not apply route as needed for other., Disp: 1 each, Rfl: 0 .  insulin glargine (LANTUS SOLOSTAR) 100 UNIT/ML Solostar Pen, Inject 34 Units into the skin at bedtime., Disp: 15 mL, Rfl: 6 .  insulin lispro (HUMALOG KWIKPEN) 100 UNIT/ML KwikPen, Inject 10 Units into the skin 3 (three) times daily with meals., Disp: 15 mL, Rfl: 6 .  Insulin Pen Needle (BD PEN NEEDLE NANO U/F) 32G X 4 MM MISC, USE AS DIRECTED 3 TIMES A DAY. Please schedule an appointment for additional refills., Disp: 100 each, Rfl: 3 .  Lancets MISC, Use as directed.  Accu chek 2, Disp: 100 each, Rfl: 11 .  methylPREDNISolone (MEDROL DOSEPAK) 4 MG TBPK tablet, follow package directions, Disp: 21 tablet, Rfl: 0 .  Multiple Vitamins-Minerals (MULTIVITAMIN WITH MINERALS) tablet, Take 1 tablet by mouth daily., Disp: , Rfl:  .  RHOPRESSA 0.02 % SOLN, Place 1 drop into the right eye at bedtime., Disp: , Rfl:  .  timolol (BETIMOL) 0.5 % ophthalmic solution, Place 1 drop into the right  eye 2 (two) times daily., Disp: , Rfl:  .  VYZULTA 0.024 % SOLN, Place 1 drop into the right eye daily. , Disp: , Rfl:  No Known Allergies Social History   Tobacco Use  Smoking Status Never Smoker  Smokeless Tobacco Never Used   Objective:  There were no vitals filed for this visit. Constitutional Patient is a pleasant 64 y.o. Caucasian male obese in NAD.Marland Kitchen AAO x 3.  Vascular Neurovascular status unchanged b/l lower extremities. Capillary fill time to digits <3 seconds b/l lower extremities. Palpable pedal pulses b/l LE. Pedal hair absent. Lower extremity skin temperature gradient within normal limits. Evidence of chronic venous insufficiency b/l LE. No cyanosis or clubbing noted.  Neurologic Normal speech. Oriented to person, place, and time. Protective sensation diminished with 10g  monofilament b/l. Vibratory sensation diminished b/l. Proprioception intact bilaterally.  Dermatologic Pedal skin with normal turgor, texture and tone bilaterally. No open wounds bilaterally. No interdigital macerations bilaterally. Toenails 1-5 b/l elongated, discolored, dystrophic, thickened, crumbly with subungual debris and tenderness to dorsal palpation. Hyperkeratotic lesion(s) submet head 5 right foot.  No erythema, no edema, no drainage, no flocculence.  Orthopedic: Normal muscle strength 5/5 to all lower extremity muscle groups bilaterally. No pain crepitus or joint limitation noted with ROM b/l. Hammertoes noted to the 2-5 bilaterally.   Hemoglobin A1C Latest Ref Rng & Units 06/28/2020 10/17/2019  HGBA1C 4.8 - 5.6 % 10.9(H) 11.8(A)  Some recent data might be hidden   Assessment:   1. Pain due to onychomycosis of toenails of both feet   2. Callus   3. Diabetic peripheral neuropathy associated with type 2 diabetes mellitus ([place])    Plan:  Patient was evaluated and treated and all questions answered.  Onychomycosis with pain -Nails palliatively debridement as below. -Educated on self-care  Procedure: Nail Debridement Rationale: Pain Type of Debridement: manual, sharp debridement. Instrumentation: Nail nipper, rotary burr. Number of Nails: 10  -Examined patient. -No new findings. No new orders. -Continue diabetic foot care principles. -Continue diabetic shoes daily. -Toenails 1-5 b/l were debrided in length and girth with sterile nail nippers and dremel without iatrogenic bleeding.  -Callus(es) submet head 5 right foot pared utilizing sterile scalpel blade without complication or incident. Total number debrided =1. -Patient to report any pedal injuries to medical professional immediately. -Patient/POA to call should there be question/concern in the interim.  Return in about 3 months (around 10/25/2020) for diabetic foot care.  Marzetta Board, DPM

## 2020-08-08 DIAGNOSIS — Z794 Long term (current) use of insulin: Secondary | ICD-10-CM | POA: Diagnosis not present

## 2020-08-08 DIAGNOSIS — H4052X3 Glaucoma secondary to other eye disorders, left eye, severe stage: Secondary | ICD-10-CM | POA: Diagnosis not present

## 2020-08-08 DIAGNOSIS — E113553 Type 2 diabetes mellitus with stable proliferative diabetic retinopathy, bilateral: Secondary | ICD-10-CM | POA: Diagnosis not present

## 2020-08-08 DIAGNOSIS — Z961 Presence of intraocular lens: Secondary | ICD-10-CM | POA: Diagnosis not present

## 2020-08-08 DIAGNOSIS — H4089 Other specified glaucoma: Secondary | ICD-10-CM | POA: Diagnosis not present

## 2020-08-09 ENCOUNTER — Other Ambulatory Visit: Payer: Self-pay | Admitting: Internal Medicine

## 2020-08-09 DIAGNOSIS — I10 Essential (primary) hypertension: Secondary | ICD-10-CM

## 2020-08-14 ENCOUNTER — Other Ambulatory Visit: Payer: Self-pay | Admitting: Internal Medicine

## 2020-08-14 DIAGNOSIS — I10 Essential (primary) hypertension: Secondary | ICD-10-CM

## 2020-08-18 DIAGNOSIS — R0600 Dyspnea, unspecified: Secondary | ICD-10-CM | POA: Diagnosis not present

## 2020-08-18 DIAGNOSIS — R531 Weakness: Secondary | ICD-10-CM | POA: Diagnosis not present

## 2020-08-18 DIAGNOSIS — U071 COVID-19: Secondary | ICD-10-CM | POA: Diagnosis not present

## 2020-08-27 ENCOUNTER — Other Ambulatory Visit: Payer: Self-pay | Admitting: Internal Medicine

## 2020-09-02 ENCOUNTER — Other Ambulatory Visit: Payer: Self-pay | Admitting: Internal Medicine

## 2020-09-02 DIAGNOSIS — I10 Essential (primary) hypertension: Secondary | ICD-10-CM

## 2020-09-11 ENCOUNTER — Ambulatory Visit: Payer: Medicare Other | Admitting: Internal Medicine

## 2020-09-12 ENCOUNTER — Encounter (INDEPENDENT_AMBULATORY_CARE_PROVIDER_SITE_OTHER): Payer: Self-pay | Admitting: Ophthalmology

## 2020-09-12 ENCOUNTER — Ambulatory Visit (INDEPENDENT_AMBULATORY_CARE_PROVIDER_SITE_OTHER): Payer: Medicare Other | Admitting: Ophthalmology

## 2020-09-12 ENCOUNTER — Other Ambulatory Visit: Payer: Self-pay

## 2020-09-12 DIAGNOSIS — H211X1 Other vascular disorders of iris and ciliary body, right eye: Secondary | ICD-10-CM

## 2020-09-12 DIAGNOSIS — E113511 Type 2 diabetes mellitus with proliferative diabetic retinopathy with macular edema, right eye: Secondary | ICD-10-CM | POA: Diagnosis not present

## 2020-09-12 MED ORDER — BEVACIZUMAB 2.5 MG/0.1ML IZ SOSY
2.5000 mg | PREFILLED_SYRINGE | INTRAVITREAL | Status: AC | PRN
  Administered 2020-09-12: 2.5 mg via INTRAVITREAL

## 2020-09-12 NOTE — Assessment & Plan Note (Signed)
High risk of progression in the past without ongoing suppression by antivegF.  Monocular status.  Diffuse ocular ischemia.

## 2020-09-12 NOTE — Progress Notes (Signed)
09/12/2020     CHIEF COMPLAINT Patient presents for Retina Follow Up (8 WK FU OU, POSS AVASTIN OU////Pt reports stable vision OU, pt denies F/F, pain, or pressure OU. ///Last A1C: around 10 taken 06/2020//Last BS: unsure, doesn't take regularly )   HISTORY OF PRESENT ILLNESS: Derek Lowery is a 65 y.o. male who presents to the clinic today for:   HPI    Retina Follow Up    Patient presents with  Diabetic Retinopathy.  In right eye.  This started 8 weeks ago.  Duration of 8 weeks.  Since onset it is stable. Additional comments: 8 WK FU OU, POSS AVASTIN OU    Pt reports stable vision OU, pt denies F/F, pain, or pressure OU.    Last A1C: around 10 taken 06/2020  Last BS: unsure, doesn't take regularly        Last edited by Nichola Sizer D on 09/12/2020 10:02 AM. (History)      Referring physician: Ladell Pier, MD [place],  [place] [address]  HISTORICAL INFORMATION:   Selected notes from the MEDICAL RECORD NUMBER    Lab Results  Component Value Date   HGBA1C 10.9 (H) 06/28/2020     CURRENT MEDICATIONS: Current Outpatient Medications (Ophthalmic Drugs)  Medication Sig  . brimonidine (ALPHAGAN) 0.2 % ophthalmic solution Place 1 drop into the right eye 2 (two) times daily.   . dorzolamide (TRUSOPT) 2 % ophthalmic solution Place 1 drop into the right eye 2 (two) times daily.  . RHOPRESSA 0.02 % SOLN Place 1 drop into the right eye at bedtime.  . timolol (BETIMOL) 0.5 % ophthalmic solution Place 1 drop into the right eye 2 (two) times daily.  Marland Kitchen VYZULTA 0.024 % SOLN Place 1 drop into the right eye daily.    No current facility-administered medications for this visit. (Ophthalmic Drugs)   Current Outpatient Medications (Other)  Medication Sig  . albuterol (VENTOLIN HFA) 108 (90 Base) MCG/ACT inhaler Inhale 1-2 puffs into the lungs every 6 (six) hours as needed for wheezing or shortness of breath.   Marland Kitchen amLODipine (NORVASC) 10 MG tablet Take 1  tablet (10 mg total) by mouth daily.  Marland Kitchen ammonium lactate (AMLACTIN) 12 % lotion Apply to both feet twice daily for dry skin. (Patient taking differently: Apply 1 application topically 2 (two) times daily as needed for dry skin. Apply to both feet twice daily for dry skin.)  . aspirin EC 81 MG tablet Take 1 tablet (81 mg total) by mouth daily.  Marland Kitchen atorvastatin (LIPITOR) 40 MG tablet TAKE 1 TABLET BY MOUTH EVERY DAY (Patient taking differently: Take 40 mg by mouth daily. )  . blood glucose meter kit and supplies KIT Dispense based on patient and insurance preference. Use up to four times daily as directed. One Touch Verio  . Continuous Blood Gluc Receiver (DEXCOM G6 RECEIVER) DEVI 1 Device by Does not apply route daily.  . Continuous Blood Gluc Sensor (DEXCOM G6 SENSOR) MISC 1 packet by Does not apply route daily.  . Continuous Blood Gluc Transmit (DEXCOM G6 TRANSMITTER) MISC 1 packet by Does not apply route daily.  Marland Kitchen ELIQUIS 5 MG TABS tablet TAKE 1 TABLET BY MOUTH TWICE A DAY  . furosemide (LASIX) 20 MG tablet TAKE 1 TABLET (20 MG TOTAL) BY MOUTH DAILY. STOP LISINOPRIL/HCTZ  . glucose blood (ACCU-CHEK AVIVA PLUS) test strip Use as instructed for 3 times daily testing of blood sugar. E11.9  . glucose monitoring kit (FREESTYLE)  monitoring kit 1 each by Does not apply route as needed for other.  . insulin glargine (LANTUS SOLOSTAR) 100 UNIT/ML Solostar Pen Inject 34 Units into the skin at bedtime.  . insulin lispro (HUMALOG KWIKPEN) 100 UNIT/ML KwikPen Inject 10 Units into the skin 3 (three) times daily with meals.  . Insulin Pen Needle (BD PEN NEEDLE NANO U/F) 32G X 4 MM MISC USE AS DIRECTED 3 TIMES A DAY. Please schedule an appointment for additional refills.  . Lancets MISC Use as directed.  Accu chek 2  . methylPREDNISolone (MEDROL DOSEPAK) 4 MG TBPK tablet follow package directions  . Multiple Vitamins-Minerals (MULTIVITAMIN WITH MINERALS) tablet Take 1 tablet by mouth daily.   No current  facility-administered medications for this visit. (Other)      REVIEW OF SYSTEMS:    ALLERGIES No Known Allergies  PAST MEDICAL HISTORY Past Medical History:  Diagnosis Date  . Arthritis   . CAD (coronary artery disease)    NSTEMI 06/2011:  LHC 07/18/11: Proximal diagonal 60%, distal LAD with a diabetic appearance and 60% stenosis, OM2 with an occluded superior branch and an inferior branch with 90%, EF 55% with inferior hypokinesis.  PCI: Promus DES to the OM2 inferior branch.  This vessel provides collaterals to the superior branch which remained occluded.  Echocardiogram 07/18/11: EF 60%, normal wall motion.  . Cataract   . Essential hypertension, benign   . Glaucoma   . Hypercholesteremia   . Internal hemorrhoids   . Noncompliance   . Type 2 diabetes mellitus ([place]) 1990   Past Surgical History:  Procedure Laterality Date  . CATARACT EXTRACTION Right 2019   Dr. Katy Fitch  . CHOLECYSTECTOMY N/A 09/21/2017   Procedure: LAPAROSCOPIC CHOLECYSTECTOMY WITH INTRAOPERATIVE CHOLANGIOGRAM;  Surgeon: Michael Boston, MD;  Location: [facility];  Service: General;  Laterality: N/A;  . COLONOSCOPY  2000   Dr. Collene Mares  . EYE SURGERY    . IRRIGATION AND DEBRIDEMENT ABSCESS Left 05/05/2014   Procedure: IRRIGATION AND DEBRIDEMENT ABSCESS left buttock;  Surgeon: Michael Boston, MD;  Location: [facility];  Service: General;  Laterality: Left;  . LEFT HEART CATHETERIZATION WITH CORONARY ANGIOGRAM N/A 07/18/2011   Procedure: LEFT HEART CATHETERIZATION WITH CORONARY ANGIOGRAM;  Surgeon: Hillary Bow, MD;  [facility];  Service: Cardiovascular;  Laterality: N/A;  . PERCUTANEOUS CORONARY STENT INTERVENTION (PCI-S)  07/18/2011   Procedure: PERCUTANEOUS CORONARY STENT INTERVENTION (PCI-S);  Surgeon: Hillary Bow, MD;  Location: [facility] CATH LAB;  Service: Cardiovascular;;    FAMILY HISTORY Family History  Problem Relation Age of Onset  . Coronary artery disease Father        Developed in his 20s  .  Kidney disease Father   . Diabetes Father   . Melanoma Father   . Rectal cancer Father   . Heart failure Mother   . Hypertension Mother   . Diabetes Sister   . Diabetes Brother   . Colon cancer Neg Hx   . Esophageal cancer Neg Hx   . Stomach cancer Neg Hx     SOCIAL HISTORY Social History   Tobacco Use  . Smoking status: Never Smoker  . Smokeless tobacco: Never Used  Vaping Use  . Vaping Use: Never used  Substance Use Topics  . Alcohol use: No  . Drug use: No         OPHTHALMIC EXAM: Base Eye Exam    Visual Acuity (ETDRS)      Right Left   Dist [place] 20/30 -1 NLP  Tonometry (Tonopen, 10:08 AM)      Right Left   Pressure 15 32       Tonometry #2 (Tonopen, 10:08 AM)      Right Left   Pressure  31       Pupils      Dark Light Shape React APD   Right 2 2 Irregular Minimal None   Left 3 3 Round Minimal +1       Visual Fields (Counting fingers)      Left Right   Restrictions Total superior temporal, inferior temporal, superior nasal, inferior nasal deficiencies Total superior temporal, inferior temporal deficiencies       Extraocular Movement      Right Left    Full Full       Neuro/Psych    Oriented x3: Yes   Mood/Affect: Normal       Dilation    Right eye: 1.0% Mydriacyl, 2.5% Phenylephrine @ 10:08 AM        Slit Lamp and Fundus Exam    External Exam      Right Left   External Normal Normal       Slit Lamp Exam      Right Left   Lids/Lashes Normal Normal   Conjunctiva/Sclera White and quiet, no exposure of tube White and quiet   Cornea Clear Clear   Anterior Chamber ,, Tube 12 meridian Deep   Iris Old NVI large trunks, no signs of new  NVI Old neovascularization of the iris 360 degrees, stable nonprogressive   Lens Posterior chamber intraocular lens 3+ Nuclear sclerosis   Anterior Vitreous Normal        Fundus Exam      Right Left   Posterior Vitreous Posterior vitreous detachment No view   Disc Normal Normal   C/D Ratio 0.7     Macula Microaneurysms,, Retinal pigment epithelial atrophy, Macular thickening, Mild clinically significant macular edema    Vessels Proliferative diabetic retinopathy, quiet    Periphery  good panretinal photocoagulation,           IMAGING AND PROCEDURES  Imaging and Procedures for 09/12/20  OCT, Retina - OU - Both Eyes       Right Eye Quality was good. Scan locations included subfoveal. Central Foveal Thickness: 387. Findings include abnormal foveal contour, cystoid macular edema.   Notes Increase CSME OD at 8-week follow-up.  We will repeat Avastin today and we will shorten interval follow-up to 6 weeks,    For this monocular patient with high risk for recurrence of neovascular disease         Intravitreal Injection, Pharmacologic Agent - OD - Right Eye       Time Out 09/12/2020. 11:06 AM. Confirmed correct patient, procedure, site, and patient consented.   Anesthesia Topical anesthesia was used. Anesthetic medications included Akten 3.5%.   Procedure Preparation included Tobramycin 0.3%, 10% betadine to eyelids, 5% betadine to ocular surface. A 30 gauge needle was used.   Injection:  2.5 mg Bevacizumab (AVASTIN) 2.72m/0.1mL SOSY   NDC: 775916-384-66 Lot:: [id number]  Route: Intravitreal, Site: Right Eye  Post-op Post injection exam found visual acuity of at least counting fingers. The patient tolerated the procedure well. There were no complications. The patient received written and verbal post procedure care education. Post injection medications were not given.                 ASSESSMENT/PLAN:  Proliferative diabetic retinopathy of right eye with macular edema associated with  type 2 diabetes mellitus (HCC) Recurrence and worsening of CSME at 8-week follow-up today.  This is the first occurrence.  We will repeat injection Avastin today and examination again in 6 weeks  Rubeosis iridis of right eye High risk of progression in the past without ongoing  suppression by antivegF.  Monocular status.  Diffuse ocular ischemia.      ICD-10-CM   1. Proliferative diabetic retinopathy of right eye with macular edema associated with type 2 diabetes mellitus (HCC)  E11.3511 OCT, Retina - OU - Both Eyes    Intravitreal Injection, Pharmacologic Agent - OD - Right Eye    bevacizumab (AVASTIN) SOSY 2.5 mg  2. Rubeosis iridis of right eye  H21.1X1     1.  Repeat injection intravitreal Avastin today for CSME associated with quiet PDR.  Or over this controls rubeosis and neovascular disease in the past.  2.  Dilate follow-up examination next OD in 6 weeks.  3.  Ophthalmic Meds Ordered this visit:  Meds ordered this encounter  Medications  . bevacizumab (AVASTIN) SOSY 2.5 mg       Return in about 6 weeks (around 10/24/2020) for dilate, OD, AVASTIN OCT.  Patient Instructions  Patient instructed to contact the office promptly for new onset visual acuity declines or distortions    Explained the diagnoses, plan, and follow up with the patient and they expressed understanding.  Patient expressed understanding of the importance of proper follow up care.   Clent Demark Shulamit Donofrio M.D. Diseases & Surgery of the Retina and Vitreous [facility] [place] 09/12/20     Abbreviations: M myopia (nearsighted); A astigmatism; H hyperopia (farsighted); P presbyopia; Mrx spectacle prescription;  CTL contact lenses; OD right eye; OS left eye; OU both eyes  XT exotropia; ET esotropia; PEK punctate epithelial keratitis; PEE punctate epithelial erosions; DES dry eye syndrome; MGD meibomian gland dysfunction; ATs artificial tears; PFAT's preservative free artificial tears; [place] nuclear sclerotic cataract; PSC posterior subcapsular cataract; ERM epi-retinal membrane; PVD posterior vitreous detachment; RD retinal detachment; DM diabetes mellitus; DR diabetic retinopathy; NPDR non-proliferative diabetic retinopathy; PDR proliferative diabetic retinopathy; CSME clinically  significant macular edema; DME diabetic macular edema; dbh dot blot hemorrhages; CWS cotton wool spot; POAG primary open angle glaucoma; C/D cup-to-disc ratio; HVF humphrey visual field; GVF goldmann visual field; OCT optical coherence tomography; IOP intraocular pressure; BRVO Branch retinal vein occlusion; CRVO central retinal vein occlusion; CRAO central retinal artery occlusion; BRAO branch retinal artery occlusion; RT retinal tear; SB scleral buckle; PPV pars plana vitrectomy; VH Vitreous hemorrhage; PRP panretinal laser photocoagulation; IVK intravitreal kenalog; VMT vitreomacular traction; MH Macular hole;  NVD neovascularization of the disc; NVE neovascularization elsewhere; AREDS age related eye disease study; ARMD age related macular degeneration; POAG primary open angle glaucoma; EBMD epithelial/anterior basement membrane dystrophy; ACIOL anterior chamber intraocular lens; IOL intraocular lens; PCIOL posterior chamber intraocular lens; Phaco/IOL phacoemulsification with intraocular lens placement; [place] photorefractive keratectomy; LASIK laser assisted in situ keratomileusis; HTN hypertension; DM diabetes mellitus; COPD chronic obstructive pulmonary disease

## 2020-09-12 NOTE — Assessment & Plan Note (Signed)
Recurrence and worsening of CSME at 8-week follow-up today.  This is the first occurrence.  We will repeat injection Avastin today and examination again in 6 weeks

## 2020-09-12 NOTE — Patient Instructions (Signed)
Patient instructed to contact the office promptly for new onset visual acuity declines or distortions 

## 2020-09-19 ENCOUNTER — Other Ambulatory Visit: Payer: Self-pay | Admitting: Internal Medicine

## 2020-09-19 DIAGNOSIS — I10 Essential (primary) hypertension: Secondary | ICD-10-CM

## 2020-09-19 DIAGNOSIS — E785 Hyperlipidemia, unspecified: Secondary | ICD-10-CM

## 2020-09-30 ENCOUNTER — Other Ambulatory Visit: Payer: Self-pay | Admitting: Internal Medicine

## 2020-10-16 ENCOUNTER — Other Ambulatory Visit: Payer: Self-pay | Admitting: Internal Medicine

## 2020-10-16 MED ORDER — AMLODIPINE BESYLATE 10 MG PO TABS
10.0000 mg | ORAL_TABLET | Freq: Every day | ORAL | 0 refills | Status: DC
Start: 2020-10-16 — End: 2020-11-13

## 2020-10-16 NOTE — Telephone Encounter (Signed)
Med was prescribed at hospital discharge.

## 2020-10-16 NOTE — Telephone Encounter (Signed)
Medication Refill - Medication: amLODipine (NORVASC) 10 MG tablet   Has the patient contacted their pharmacy? Yes  (Agent: If yes, when and what did the pharmacy advise?) Contact PCP office due to the pharmacy not hearing back.  Preferred Pharmacy (with phone number or street name):  [id number] [place]:  [phone] Fax:  [phone]     Agent: Please be advised that RX refills may take up to 3 business days. We ask that you follow-up with your pharmacy.

## 2020-10-24 ENCOUNTER — Encounter (INDEPENDENT_AMBULATORY_CARE_PROVIDER_SITE_OTHER): Payer: Self-pay | Admitting: Ophthalmology

## 2020-10-24 ENCOUNTER — Ambulatory Visit (INDEPENDENT_AMBULATORY_CARE_PROVIDER_SITE_OTHER): Payer: Medicare Other | Admitting: Ophthalmology

## 2020-10-24 ENCOUNTER — Other Ambulatory Visit: Payer: Self-pay

## 2020-10-24 DIAGNOSIS — E113511 Type 2 diabetes mellitus with proliferative diabetic retinopathy with macular edema, right eye: Secondary | ICD-10-CM | POA: Diagnosis not present

## 2020-10-24 MED ORDER — BEVACIZUMAB 2.5 MG/0.1ML IZ SOSY
2.5000 mg | PREFILLED_SYRINGE | INTRAVITREAL | Status: AC | PRN
  Administered 2020-10-24: 2.5 mg via INTRAVITREAL

## 2020-10-24 NOTE — Progress Notes (Signed)
10/24/2020     CHIEF COMPLAINT Patient presents for Retina Follow Up (6 Wk F/U OD, poss Avastin OD//Pt sts [facility] OD fluctuates off and on, especially when looking at a computer screen. No other new symptoms reported OU. /LBS: has not checked recently)   HISTORY OF PRESENT ILLNESS: Derek Lowery is a 65 y.o. male who presents to the clinic today for:   HPI    Retina Follow Up    Patient presents with  Diabetic Retinopathy.  In right eye.  This started 6 weeks ago.  Severity is mild.  Duration of 6 weeks.  Since onset it is stable. Additional comments: 6 Wk F/U OD, poss Avastin OD  Pt sts VA OD fluctuates off and on, especially when looking at a computer screen. No other new symptoms reported OU.  LBS: has not checked recently       Last edited by Rockie Neighbours, [place] on 10/24/2020 10:49 AM. (History)      Referring physician: Ladell Pier, MD [place],  [place] [address]  HISTORICAL INFORMATION:   Selected notes from the MEDICAL RECORD NUMBER    Lab Results  Component Value Date   HGBA1C 10.9 (H) 06/28/2020     CURRENT MEDICATIONS: Current Outpatient Medications (Ophthalmic Drugs)  Medication Sig  . brimonidine (ALPHAGAN) 0.2 % ophthalmic solution Place 1 drop into the right eye 2 (two) times daily.   . dorzolamide (TRUSOPT) 2 % ophthalmic solution Place 1 drop into the right eye 2 (two) times daily.  . RHOPRESSA 0.02 % SOLN Place 1 drop into the right eye at bedtime.  . timolol (BETIMOL) 0.5 % ophthalmic solution Place 1 drop into the right eye 2 (two) times daily.  Marland Kitchen VYZULTA 0.024 % SOLN Place 1 drop into the right eye daily.    No current facility-administered medications for this visit. (Ophthalmic Drugs)   Current Outpatient Medications (Other)  Medication Sig  . albuterol (VENTOLIN HFA) 108 (90 Base) MCG/ACT inhaler Inhale 1-2 puffs into the lungs every 6 (six) hours as needed for wheezing or shortness of breath.   Marland Kitchen amLODipine (NORVASC) 10 MG  tablet Take 1 tablet (10 mg total) by mouth daily.  Marland Kitchen ammonium lactate (AMLACTIN) 12 % lotion Apply to both feet twice daily for dry skin. (Patient taking differently: Apply 1 application topically 2 (two) times daily as needed for dry skin. Apply to both feet twice daily for dry skin.)  . aspirin EC 81 MG tablet Take 1 tablet (81 mg total) by mouth daily.  Marland Kitchen atorvastatin (LIPITOR) 40 MG tablet TAKE 1 TABLET BY MOUTH EVERY DAY  . blood glucose meter kit and supplies KIT Dispense based on patient and insurance preference. Use up to four times daily as directed. One Touch Verio  . Continuous Blood Gluc Receiver (DEXCOM G6 RECEIVER) DEVI 1 Device by Does not apply route daily.  . Continuous Blood Gluc Sensor (DEXCOM G6 SENSOR) MISC 1 packet by Does not apply route daily.  . Continuous Blood Gluc Transmit (DEXCOM G6 TRANSMITTER) MISC 1 packet by Does not apply route daily.  Marland Kitchen ELIQUIS 5 MG TABS tablet TAKE 1 TABLET BY MOUTH TWICE A DAY  . furosemide (LASIX) 20 MG tablet TAKE 1 TABLET (20 MG TOTAL) BY MOUTH DAILY. STOP LISINOPRIL/HCTZ  . glucose blood (ACCU-CHEK AVIVA PLUS) test strip Use as instructed for 3 times daily testing of blood sugar. E11.9  . glucose monitoring kit (FREESTYLE) monitoring kit 1 each by Does not apply  route as needed for other.  . insulin glargine (LANTUS SOLOSTAR) 100 UNIT/ML Solostar Pen Inject 34 Units into the skin at bedtime.  . insulin lispro (HUMALOG KWIKPEN) 100 UNIT/ML KwikPen Inject 10 Units into the skin 3 (three) times daily with meals.  . Insulin Pen Needle (BD PEN NEEDLE NANO U/F) 32G X 4 MM MISC USE AS DIRECTED 3 TIMES A DAY. Please schedule an appointment for additional refills.  . Lancets MISC Use as directed.  Accu chek 2  . methylPREDNISolone (MEDROL DOSEPAK) 4 MG TBPK tablet follow package directions  . Multiple Vitamins-Minerals (MULTIVITAMIN WITH MINERALS) tablet Take 1 tablet by mouth daily.   No current facility-administered medications for this visit.  (Other)      REVIEW OF SYSTEMS:    ALLERGIES No Known Allergies  PAST MEDICAL HISTORY Past Medical History:  Diagnosis Date  . Arthritis   . CAD (coronary artery disease)    NSTEMI 06/2011:  LHC 07/18/11: Proximal diagonal 60%, distal LAD with a diabetic appearance and 60% stenosis, OM2 with an occluded superior branch and an inferior branch with 90%, EF 55% with inferior hypokinesis.  PCI: Promus DES to the OM2 inferior branch.  This vessel provides collaterals to the superior branch which remained occluded.  Echocardiogram 07/18/11: EF 60%, normal wall motion.  . Cataract   . Essential hypertension, benign   . Glaucoma   . Hypercholesteremia   . Internal hemorrhoids   . Noncompliance   . Type 2 diabetes mellitus ([place]) 1990   Past Surgical History:  Procedure Laterality Date  . CATARACT EXTRACTION Right 2019   Dr. Katy Fitch  . CHOLECYSTECTOMY N/A 09/21/2017   Procedure: LAPAROSCOPIC CHOLECYSTECTOMY WITH INTRAOPERATIVE CHOLANGIOGRAM;  Surgeon: Michael Boston, MD;  Location: [facility];  Service: General;  Laterality: N/A;  . COLONOSCOPY  2000   Dr. Collene Mares  . EYE SURGERY    . IRRIGATION AND DEBRIDEMENT ABSCESS Left 05/05/2014   Procedure: IRRIGATION AND DEBRIDEMENT ABSCESS left buttock;  Surgeon: Michael Boston, MD;  Location: [facility];  Service: General;  Laterality: Left;  . LEFT HEART CATHETERIZATION WITH CORONARY ANGIOGRAM N/A 07/18/2011   Procedure: LEFT HEART CATHETERIZATION WITH CORONARY ANGIOGRAM;  Surgeon: Hillary Bow, MD;  Location[facility] CATH LAB;  Service: Cardiovascular;  Laterality: N/A;  . PERCUTANEOUS CORONARY STENT INTERVENTION (PCI-S)  07/18/2011   Procedure: PERCUTANEOUS CORONARY STENT INTERVENTION (PCI-S);  Surgeon: Hillary Bow, MD;  Location: [facility] CATH LAB;  Service: Cardiovascular;;    FAMILY HISTORY Family History  Problem Relation Age of Onset  . Coronary artery disease Father        Developed in his 27s  . Kidney disease Father   . Diabetes Father   .  Melanoma Father   . Rectal cancer Father   . Heart failure Mother   . Hypertension Mother   . Diabetes Sister   . Diabetes Brother   . Colon cancer Neg Hx   . Esophageal cancer Neg Hx   . Stomach cancer Neg Hx     SOCIAL HISTORY Social History   Tobacco Use  . Smoking status: Never Smoker  . Smokeless tobacco: Never Used  Vaping Use  . Vaping Use: Never used  Substance Use Topics  . Alcohol use: No  . Drug use: No         OPHTHALMIC EXAM: Base Eye Exam    Visual Acuity (ETDRS)      Right Left   Dist [place] 20/25 NLP       Tonometry (Tonopen, 10:50  AM)      Right Left   Pressure 15 33       Pupils      Dark Light Shape React APD   Right 3 3 Irregular Minimal None   Left 3.5 3.5 Round Minimal +1       Visual Fields (Counting fingers)      Left Right   Restrictions Total superior temporal, inferior temporal, superior nasal, inferior nasal deficiencies Total inferior temporal deficiency       Extraocular Movement      Right Left    Full Full       Neuro/Psych    Oriented x3: Yes   Mood/Affect: Normal       Dilation    Right eye: 1.0% Mydriacyl, 2.5% Phenylephrine @ 10:52 AM        Slit Lamp and Fundus Exam    External Exam      Right Left   External Normal Normal       Slit Lamp Exam      Right Left   Lids/Lashes Normal Normal   Conjunctiva/Sclera White and quiet, no exposure of tube White and quiet   Cornea Clear Clear   Anterior Chamber ,, Tube 12 meridian Deep   Iris Old NVI large trunks, no signs of new  NVI Old neovascularization of the iris 360 degrees, stable nonprogressive   Lens Posterior chamber intraocular lens 3+ Nuclear sclerosis   Anterior Vitreous Normal        Fundus Exam      Right Left   Posterior Vitreous Posterior vitreous detachment    Disc Normal    C/D Ratio 0.7    Macula Microaneurysms,, Retinal pigment epithelial atrophy, Macular thickening, Mild clinically significant macular edema    Vessels Proliferative  diabetic retinopathy, quiet    Periphery  good panretinal photocoagulation,           IMAGING AND PROCEDURES  Imaging and Procedures for 10/24/20  OCT, Retina - OU - Both Eyes       Right Eye Quality was good. Scan locations included subfoveal. Central Foveal Thickness: 286. Progression has improved. Findings include abnormal foveal contour, cystoid macular edema.   Notes  Improved CSME OD at 6-week follow-up.  We will repeat Avastin today and we will shorten interval follow-up to 6 weeks,    For this monocular patient with high risk for recurrence of neovascular disease    No views OS       Intravitreal Injection, Pharmacologic Agent - OD - Right Eye       Time Out 10/24/2020. 11:13 AM. Confirmed correct patient, procedure, site, and patient consented.   Anesthesia Topical anesthesia was used. Anesthetic medications included [place] 3.5%.   Procedure Preparation included Tobramycin 0.3%, 10% betadine to eyelids, 5% betadine to ocular surface. A 30 gauge needle was used.   Injection:  2.5 mg Bevacizumab (AVASTIN) 2.6m/0.1mL SOSY   NDC:: 62563-893-73 Lot:: [id number]  Route: Intravitreal, Site: Right Eye  Post-op Post injection exam found visual acuity of at least counting fingers. The patient tolerated the procedure well. There were no complications. The patient received written and verbal post procedure care education. Post injection medications were not given.                 ASSESSMENT/PLAN:  Diabetic macular edema of right eye with proliferative retinopathy associated with type 2 diabetes mellitus (HCC) CSME presents some 6 weeks previous OCT, dated 09/12/2020.  Now improved a shorter follow-up interval  post Avastin.  Moreover patient has advanced neovascular disease with anterior segment neovascularization which does reactivate on prior attempts to extend interval.  Thus repeat injection Avastin today and follow-up again in 6 weeks      ICD-10-CM   1.  Proliferative diabetic retinopathy of right eye with macular edema associated with type 2 diabetes mellitus (HCC)  E11.3511 OCT, Retina - OU - Both Eyes    Intravitreal Injection, Pharmacologic Agent - OD - Right Eye    bevacizumab (AVASTIN) SOSY 2.5 mg  2. Diabetic macular edema of right eye with proliferative retinopathy associated with type 2 diabetes mellitus ([place])  E11.3511     1.  Stable anterior segment examination and stable neovascular glaucoma on intravitreal Avastin.  OD.  OD also with improved CSME at 6-week follow-up as compared to prior 8-week interval examination  2.  Repeat intravitreal Avastin OD today and examination again in 6 weeks  3.  Ophthalmic Meds Ordered this visit:  Meds ordered this encounter  Medications  . bevacizumab (AVASTIN) SOSY 2.5 mg       Return in about 6 weeks (around 12/05/2020) for dilate, OD, AVASTIN OCT.  There are no Patient Instructions on file for this visit.   Explained the diagnoses, plan, and follow up with the patient and they expressed understanding.  Patient expressed understanding of the importance of proper follow up care.   Clent Demark Lesleyann Fichter M.D. Diseases & Surgery of the Retina and Vitreous [facility] 10/24/20     Abbreviations: M myopia (nearsighted); A astigmatism; H hyperopia (farsighted); P presbyopia; Mrx spectacle prescription;  CTL contact lenses; OD right eye; OS left eye; OU both eyes  XT exotropia; ET esotropia; PEK punctate epithelial keratitis; PEE punctate epithelial erosions; DES dry eye syndrome; MGD meibomian gland dysfunction; ATs artificial tears; PFAT's preservative free artificial tears; [place] nuclear sclerotic cataract; PSC posterior subcapsular cataract; ERM epi-retinal membrane; PVD posterior vitreous detachment; RD retinal detachment; DM diabetes mellitus; DR diabetic retinopathy; NPDR non-proliferative diabetic retinopathy; PDR proliferative diabetic retinopathy; CSME clinically  significant macular edema; DME diabetic macular edema; dbh dot blot hemorrhages; CWS cotton wool spot; POAG primary open angle glaucoma; C/D cup-to-disc ratio; HVF humphrey visual field; GVF goldmann visual field; OCT optical coherence tomography; IOP intraocular pressure; BRVO Branch retinal vein occlusion; CRVO central retinal vein occlusion; CRAO central retinal artery occlusion; BRAO branch retinal artery occlusion; RT retinal tear; SB scleral buckle; PPV pars plana vitrectomy; VH Vitreous hemorrhage; PRP panretinal laser photocoagulation; IVK intravitreal kenalog; VMT vitreomacular traction; MH Macular hole;  NVD neovascularization of the disc; NVE neovascularization elsewhere; AREDS age related eye disease study; ARMD age related macular degeneration; POAG primary open angle glaucoma; EBMD epithelial/anterior basement membrane dystrophy; ACIOL anterior chamber intraocular lens; IOL intraocular lens; PCIOL posterior chamber intraocular lens; Phaco/IOL phacoemulsification with intraocular lens placement; [place] photorefractive keratectomy; LASIK laser assisted in situ keratomileusis; HTN hypertension; DM diabetes mellitus; COPD chronic obstructive pulmonary disease

## 2020-10-24 NOTE — Assessment & Plan Note (Signed)
CSME presents some 6 weeks previous OCT, dated 09/12/2020.  Now improved a shorter follow-up interval post Avastin.  Moreover patient has advanced neovascular disease with anterior segment neovascularization which does reactivate on prior attempts to extend interval.  Thus repeat injection Avastin today and follow-up again in 6 weeks

## 2020-10-27 ENCOUNTER — Other Ambulatory Visit: Payer: Self-pay | Admitting: Internal Medicine

## 2020-10-30 ENCOUNTER — Ambulatory Visit: Payer: Medicare Other | Admitting: Podiatry

## 2020-11-11 ENCOUNTER — Encounter: Payer: Self-pay | Admitting: Internal Medicine

## 2020-11-11 NOTE — Progress Notes (Signed)
Received negative hepatitis C results that was done during a recent [employer] visit provided by his health plan.  Test was done on 10/26/2020.

## 2020-11-13 ENCOUNTER — Other Ambulatory Visit: Payer: Self-pay | Admitting: Internal Medicine

## 2020-11-13 NOTE — Telephone Encounter (Signed)
Requested Prescriptions  Pending Prescriptions Disp Refills  . amLODipine (NORVASC) 10 MG tablet [Pharmacy Med Name: AMLODIPINE BESYLATE 10 MG TAB] 4 tablet 0    Sig: *NEED OFFICE VISIT FOR REFILLS* TAKE 1 TABLET BY MOUTH EVERY DAY     Cardiovascular:  Calcium Channel Blockers Failed - 11/13/2020 10:30 AM      Failed - Last BP in normal range    BP Readings from Last 1 Encounters:  07/02/20 (!) 155/85         Passed - Valid encounter within last 6 months    Recent Outpatient Visits          4 months ago Hospital discharge follow-up   [place], Deborah B, MD   1 year ago Type 2 diabetes mellitus with diabetic polyneuropathy, with long-term current use of insulin ([place])   [place], Deborah B, MD   1 year ago DM type 2 with diabetic peripheral neuropathy [facility])   [place], [place], [place]   1 year ago DM type 2 with diabetic peripheral neuropathy [facility])   [facility] Karle Plumber B, MD   2 years ago Uncontrolled type 2 diabetes mellitus with peripheral neuropathy [facility])   [place] [facility] Ladell Pier, MD

## 2020-11-16 ENCOUNTER — Other Ambulatory Visit: Payer: Self-pay

## 2020-11-16 ENCOUNTER — Ambulatory Visit: Payer: Medicare Other | Attending: Internal Medicine | Admitting: Internal Medicine

## 2020-11-16 VITALS — BP 174/80 | HR 86 | Resp 16 | Wt 261.8 lb

## 2020-11-16 DIAGNOSIS — Z794 Long term (current) use of insulin: Secondary | ICD-10-CM

## 2020-11-16 DIAGNOSIS — K649 Unspecified hemorrhoids: Secondary | ICD-10-CM

## 2020-11-16 DIAGNOSIS — I2510 Atherosclerotic heart disease of native coronary artery without angina pectoris: Secondary | ICD-10-CM | POA: Diagnosis not present

## 2020-11-16 DIAGNOSIS — Z8616 Personal history of COVID-19: Secondary | ICD-10-CM

## 2020-11-16 DIAGNOSIS — E1142 Type 2 diabetes mellitus with diabetic polyneuropathy: Secondary | ICD-10-CM

## 2020-11-16 DIAGNOSIS — K625 Hemorrhage of anus and rectum: Secondary | ICD-10-CM | POA: Diagnosis not present

## 2020-11-16 DIAGNOSIS — L853 Xerosis cutis: Secondary | ICD-10-CM

## 2020-11-16 DIAGNOSIS — I10 Essential (primary) hypertension: Secondary | ICD-10-CM

## 2020-11-16 LAB — POCT GLYCOSYLATED HEMOGLOBIN (HGB A1C): HbA1c, POC (controlled diabetic range): 8.8 % — AB (ref 0.0–7.0)

## 2020-11-16 LAB — GLUCOSE, POCT (MANUAL RESULT ENTRY): POC Glucose: 268 mg/dl — AB (ref 70–99)

## 2020-11-16 MED ORDER — FUROSEMIDE 20 MG PO TABS: 20.0000 mg | ORAL_TABLET | Freq: Every day | ORAL | 2 refills | Status: DC

## 2020-11-16 MED ORDER — HYDROCORTISONE (PERIANAL) 2.5 % EX CREA
1.0000 | TOPICAL_CREAM | Freq: Two times a day (BID) | CUTANEOUS | 1 refills | Status: DC | PRN
Start: 2020-11-16 — End: 2021-09-07

## 2020-11-16 MED ORDER — LANTUS SOLOSTAR 100 UNIT/ML ~~LOC~~ SOPN: 38.0000 [IU] | PEN_INJECTOR | Freq: Every day | SUBCUTANEOUS | 6 refills | Status: DC

## 2020-11-16 MED ORDER — AMMONIUM LACTATE 12 % EX LOTN
TOPICAL_LOTION | CUTANEOUS | 6 refills | Status: AC
Start: 2020-11-16 — End: ?

## 2020-11-16 MED ORDER — AMLODIPINE BESYLATE 10 MG PO TABS: 10.0000 mg | ORAL_TABLET | Freq: Every day | ORAL | 3 refills | Status: DC

## 2020-11-16 NOTE — Patient Instructions (Signed)
Stop Eliquis. Use the Anusol cream as needed when you get flare up of hemorrhoids.    I will let you know whether to restart the Lisinopril once I see your lab results.  Increase Lantus insulin to 38 units daily.

## 2020-11-16 NOTE — Progress Notes (Signed)
Patient ID: Derek Lowery, male    DOB: 01/05/1956  MRN: [id number]  CC: Diabetes and Hypertension   Subjective: Derek Lowery is a 65 y.o. male who presents for chronic ds management His concerns today include:  hx of DM type 2 with retinopathy(laser treatments by Dr. Beather Arbour neuropathy, and microalbuminuria,HTN, CAD with stent OM2 in 2012, glaucoma (blind LT eye) and HL.  My last visit with him was via telephone in November posthospitalization for hypoxic respiratory failure secondary to Covid pneumonia.  He was discharged home on 2 to 3 L of O2.  The plan was for him to follow-up with me in January to be checked to see whether he still needed the oxygen.  However he no showed for the appointment.   -He tells me that he has not been using the oxygen.  He checks his pulse ox several times a day and his oxygen range has remained between 95 to 99% on room air with rest or exertion.   -Still on Eliquis which was overlooked.  I had reached out to [facility] at the time to find out why he was on Eliquis prior to the hospitalization with Covid.  He was put on it because his D-dimer was elevated in the presence of Covid.  -Today he c/o notice blood in stools since being on Eliquis so he cut back to taking it just once a day for the past month.  Since then he has not noticed any further blood in the stools.  He feels the blood in the stools is due to flareup of external hemorrhoids.  He states that he can feel them and the bleeding occurs only when he has to strain to move his bowels.  He has been taking Metamucil once a week over the past month which has helped keep the bowel movements soft.  Cut back to 1 days 1 mth ago.  No blood in stools in since.  No dizziness or fatigue  DIABETES TYPE 2 Last A1C:   Results for orders placed or performed in visit on 11/16/20  POCT glucose (manual entry)  Result Value Ref Range   POC Glucose 268 (A) 70 - 99 mg/dl  POCT glycosylated  hemoglobin (Hb A1C)  Result Value Ref Range   Hemoglobin A1C     HbA1c POC (<> result, manual entry)     HbA1c, POC (prediabetic range)     HbA1c, POC (controlled diabetic range) 8.8 (A) 0.0 - 7.0 %    Med Adherence:  _0  Yes -34 Lnatus, Humlog 10 with meals    _1  No Medication side effects:  _2  Yes    _3  No Home Monitoring?  _4  Yes    _5  No. -We will working on trying to get him a Dexcom device through [employer].  Patient needed to call and create a profile with them and he is not sure whether he did or not. Home glucose results range: Diet Adherence: better Exercise: _6  Yes    _7  No Hypoglycemic episodes?: _8  Yes    _9  No Numbness of the feet? _10  Yes    _11  No Retinopathy hx? _12  Yes    _13  No Last eye exam: Followed by Dr. [name] Comments:   HTN/CAD HYPERTENSION Currently taking: see medication list.  He is on Norvasc and furosemide.  Lisinopril and HCTZ were held from hospitalization back in November due to AKI.  The plan was to reevaluate by checking his kidney function but patient never came to the  lab to have blood test done. Med Adherence: _0  Yes    _1  No Medication side effects: _2  Yes    _3  No Adherence with salt restriction: _4  Yes    _5  No Home Monitoring?: _6  Yes    _7  No but he does have a device at home Monitoring Frequency: _8  Yes    _9  No Home BP results range: _10  Yes    _11  No SOB? _12  Yes    _13  No Chest Pain?: _14  Yes    _15  No Leg swelling?: _16  Yes    _17  No Headaches?: _18  Yes    _19  No Dizziness? _20  Yes    _21  No Comments:   Patient Active Problem List   Diagnosis Date Noted  . Pneumatouria 06/28/2020  . Acute respiratory failure with hypoxia ([place]) 06/28/2020  . Pneumonia due to COVID-19 virus 06/28/2020  . Proliferative diabetic retinopathy of right eye with macular edema associated with type 2 diabetes mellitus ([place]) 11/30/2019  . Rubeosis iridis of right eye 11/30/2019  . Diabetic macular edema of right eye with proliferative retinopathy  associated with type 2 diabetes mellitus ([place]) 11/30/2019  . Glaucoma with pupillary seclusion, unspecified laterality, severe stage 11/30/2019  . Trigger middle finger of right hand 10/17/2019  . Pain due to onychomycosis of toenails of both feet 08/07/2019  . Educated about COVID-19 virus infection 12/02/2018  . RBBB 12/02/2018  . Benign paroxysmal positional vertigo 06/25/2018  . Pre-ulcerative corn or callous 10/13/2017  . Anemia 10/13/2017  . Chronic diarrhea 06/09/2017  . Microalbuminuria 03/07/2017  . Dry skin 03/05/2017  . Glaucoma 03/05/2017  . Legally blind 03/05/2017  . Retinopathy due to secondary diabetes ([place]) 02/06/2016  . Diabetic polyneuropathy associated with type 2 diabetes mellitus ([place]) 01/12/2015  . Fournier's gangrene into true pelvis s/p I&D 05/05/2014 05/05/2014  . Type 2 diabetes mellitus with complication, with long-term current use of insulin ([place]) 07/17/2011  . HLD (hyperlipidemia) 07/17/2011  . Coronary artery disease of native artery of native heart with stable angina pectoris ([place]) 07/17/2011  . Essential hypertension 07/17/2011     Current Outpatient Medications on File Prior to Visit  Medication Sig Dispense Refill  . albuterol (VENTOLIN HFA) 108 (90 Base) MCG/ACT inhaler Inhale 1-2 puffs into the lungs every 6 (six) hours as needed for wheezing or shortness of breath.     Marland Kitchen aspirin EC 81 MG tablet Take 1 tablet (81 mg total) by mouth daily. 30 tablet 4  . atorvastatin (LIPITOR) 40 MG tablet TAKE 1 TABLET BY MOUTH EVERY DAY 90 tablet 0  . blood glucose meter kit and supplies KIT Dispense based on patient and insurance preference. Use up to four times daily as directed. One Touch Verio 1 each 0  . brimonidine (ALPHAGAN) 0.2 % ophthalmic solution Place 1 drop into the right eye 2 (two) times daily.     . Continuous Blood Gluc Receiver (DEXCOM G6 RECEIVER) DEVI 1 Device by Does not apply route daily. 1 each 0  . Continuous Blood Gluc Sensor (DEXCOM G6  SENSOR) MISC 1 packet by Does not apply route daily. 3 each 11  . Continuous Blood Gluc Transmit (DEXCOM G6 TRANSMITTER) MISC 1 packet by Does not apply route daily. 1 each 4  . dorzolamide (TRUSOPT) 2 % ophthalmic solution Place 1 drop into the right eye 2 (two) times daily.    Marland Kitchen glucose blood (ACCU-CHEK AVIVA PLUS) test strip Use as instructed for 3 times daily testing of blood sugar. E11.9 100 each 6  .  glucose monitoring kit (FREESTYLE) monitoring kit 1 each by Does not apply route as needed for other. 1 each 0  . insulin lispro (HUMALOG KWIKPEN) 100 UNIT/ML KwikPen Inject 10 Units into the skin 3 (three) times daily with meals. 15 mL 6  . Insulin Pen Needle (BD PEN NEEDLE NANO U/F) 32G X 4 MM MISC USE AS DIRECTED 3 TIMES A DAY. Please schedule an appointment for additional refills. 100 each 3  . Lancets MISC Use as directed.  Accu chek 2 100 each 11  . methylPREDNISolone (MEDROL DOSEPAK) 4 MG TBPK tablet follow package directions 21 tablet 0  . Multiple Vitamins-Minerals (MULTIVITAMIN WITH MINERALS) tablet Take 1 tablet by mouth daily.    . RHOPRESSA 0.02 % SOLN Place 1 drop into the right eye at bedtime.    . timolol (BETIMOL) 0.5 % ophthalmic solution Place 1 drop into the right eye 2 (two) times daily.    Marland Kitchen VYZULTA 0.024 % SOLN Place 1 drop into the right eye daily.      No current facility-administered medications on file prior to visit.    No Known Allergies  Social History   Socioeconomic History  . Marital status: Single    Spouse name: Not on file  . Number of children: Not on file  . Years of education: Not on file  . Highest education level: Not on file  Occupational History  . Occupation: Theatre stage manager: [employer]  Tobacco Use  . Smoking status: Never Smoker  . Smokeless tobacco: Never Used  Vaping Use  . Vaping Use: Never used  Substance and Sexual Activity  . Alcohol use: No  . Drug use: No  . Sexual activity: Never  Other Topics Concern  . Not on file   Social History Narrative   Works at [employer]   [phone] [place] Strain: Not on file  Food Insecurity: Not on file  Transportation Needs: Not on file  Physical Activity: Not on file  Stress: Not on file  Social Connections: Not on file  Intimate Partner Violence: Not on file    Family History  Problem Relation Age of Onset  . Coronary artery disease Father        Developed in his 68s  . Kidney disease Father   . Diabetes Father   . Melanoma Father   . Rectal cancer Father   . Heart failure Mother   . Hypertension Mother   . Diabetes Sister   . Diabetes Brother   . Colon cancer Neg Hx   . Esophageal cancer Neg Hx   . Stomach cancer Neg Hx     Past Surgical History:  Procedure Laterality Date  . CATARACT EXTRACTION Right 2019   Dr. Katy Fitch  . CHOLECYSTECTOMY N/A 09/21/2017   Procedure: LAPAROSCOPIC CHOLECYSTECTOMY WITH INTRAOPERATIVE CHOLANGIOGRAM;  Surgeon: Michael Boston, MD;  Location: [facility];  Service: General;  Laterality: N/A;  . COLONOSCOPY  2000   Dr. Collene Mares  . EYE SURGERY    . IRRIGATION AND DEBRIDEMENT ABSCESS Left 05/05/2014   Procedure: IRRIGATION AND DEBRIDEMENT ABSCESS left buttock;  Surgeon: Michael Boston, MD;  Location: [facility];  Service: General;  Laterality: Left;  . LEFT HEART CATHETERIZATION WITH CORONARY ANGIOGRAM N/A 07/18/2011   Procedure: LEFT HEART CATHETERIZATION WITH CORONARY ANGIOGRAM;  Surgeon: Hillary Bow, MD;  Location[facility] C[facility] LAB;  Service: Cardiovascular;  Laterality: N/A;  . PERCUTANEOUS CORONARY STENT INTERVENTION (PCI-S)  07/18/2011  Procedure: PERCUTANEOUS CORONARY STENT INTERVENTION (PCI-S);  Surgeon: Hillary Bow, MD;  Location: [facility] CATH LAB;  Service: Cardiovascular;;    ROS: Review of Systems Negative except as stated above  PHYSICAL EXAM: BP (!) 174/80   Pulse 86   Resp 16   Wt 261 lb 12.8 oz (118.8 kg)   SpO2 95%   BMI 37.56 kg/m   Physical  Exam General appearance - alert, elderly Caucasian male who looks older than his stated age.  He has on his T-shirt that is soiled.  He ambulates with a walker.   Mental status - normal mood, behavior, speech, dress, motor activity, and thought processes Neck - supple, no significant adenopathy Eyes: Pink conjunctiva Chest - clear to auscultation, no wheezes, rales or rhonchi, symmetric air entry Heart - normal rate, regular rhythm, normal S1, S2, no murmurs, rubs, clicks or gallops Extremities -trace to 1+ bilateral lower extremity edema Skin: He has white thickened dead skin/plaques on both lower legs from about the mid shin down including the dorsal surface of the feet.  Skin between the toes are thickened.  Toenails are thick.  He has good dorsalis pedis pulses. Rectal exam: Patient declines rectal exam. Diabetic Foot Exam - Simple   Simple Foot Form Visual Inspection See comments: Yes Sensation Testing See comments: Yes Pulse Check Posterior Tibialis and Dorsalis pulse intact bilaterally: Yes Comments He has good pulses in the feet.  Toenails are thickened discolored.  He has thick white plaques on the dorsal surface of both feet and around the ankles posteriorly      CMP Latest Ref Rng & Units 07/02/2020 07/01/2020 06/30/2020  Glucose 70 - 99 mg/dL 125(H) 248(H) 156(H)  BUN 8 - 23 mg/dL 65(H) 58(H) 51(H)  Creatinine 0.61 - 1.24 mg/dL 1.70(H) 1.82(H) 1.67(H)  Sodium 135 - 145 mmol/L 135 135 136  Potassium 3.5 - 5.1 mmol/L 4.1 3.9 4.2  Chloride 98 - 111 mmol/L 100 100 100  CO2 22 - 32 mmol/L _0 Calcium 8.9 - 10.3 mg/dL 8.6(L) 8.5(L) 8.4(L)  Total Protein 6.5 - 8.1 g/dL 6.6 6.8 6.9  Total Bilirubin 0.3 - 1.2 mg/dL 0.8 0.7 0.8  Alkaline Phos 38 - 126 U/L 95 99 94  AST 15 - 41 U/L 35 33 37  ALT 0 - 44 U/L 34 34 34   Lipid Panel     Component Value Date/Time   CHOL 122 07/12/2019 1042   TRIG 75 06/28/2020 1211   HDL 30 (L) 07/12/2019 1042   CHOLHDL 4.1 07/12/2019  1042   CHOLHDL 3.2 11/29/2015 0936   VLDL 19 11/29/2015 0936   LDLCALC 62 07/12/2019 1042    CBC    Component Value Date/Time   WBC 10.9 (H) 07/02/2020 0245   RBC 3.98 (L) 07/02/2020 0245   HGB 11.3 (L) 07/02/2020 0245   HGB 12.6 (L) 07/12/2019 1042   HCT 34.2 (L) 07/02/2020 0245   HCT 39.6 07/12/2019 1042   PLT 361 07/02/2020 0245   PLT 138 (L) 07/12/2019 1042   MCV 85.9 07/02/2020 0245   MCV 88 07/12/2019 1042   MCH 28.4 07/02/2020 0245   MCHC 33.0 07/02/2020 0245   RDW 12.8 07/02/2020 0245   RDW 12.7 07/12/2019 1042   LYMPHSABS 1.0 07/02/2020 0245   LYMPHSABS 1.8 07/12/2019 1042   MONOABS 0.9 07/02/2020 0245   EOSABS 0.0 07/02/2020 0245   EOSABS 0.3 07/12/2019 1042   BASOSABS 0.0 07/02/2020 0245   BASOSABS 0.1 07/12/2019 1042  ASSESSMENT AND PLAN: 1. Type 2 diabetes mellitus with diabetic polyneuropathy, with long-term current use of insulin (HCC) Not at goal. Advised patient that I hate to increase his insulin blindly but for now given his A1c and blood sugar today, I want him to increase the Lantus insulin to 38 units.  Continue current dose of NovoLog. -Advised to use his manual device which he has at home to check blood sugars at least once a day until we figure out about the Dexcom device - POCT glucose (manual entry) - POCT glycosylated hemoglobin (Hb A1C) - CBC - Comprehensive metabolic panel - Lipid panel - insulin glargine (LANTUS SOLOSTAR) 100 UNIT/ML Solostar Pen; Inject 38 Units into the skin at bedtime.  Dispense: 15 mL; Refill: 6  2. Essential hypertension Not at goal.  I would like to see what his kidney function looks like today on chemistry.  Once I get the results we will make a decision about restarting lisinopril/hydrochlorothiazide versus using a different medication like carvedilol - amLODipine (NORVASC) 10 MG tablet; Take 1 tablet (10 mg total) by mouth daily.  Dispense: 90 tablet; Refill: 3 - furosemide (LASIX) 20 MG tablet; Take 1 tablet  (20 mg total) by mouth daily.  Dispense: 90 tablet; Refill: 2  3. Coronary artery disease involving native coronary artery of native heart without angina pectoris Stable. Stop Eliquis.  Restart baby aspirin.  Continue atorvastatin.  4. Xerosis cutis Recommend soaking his lower legs and feet in warm water every day for about 10 minutes to soften up the skin.  Apply lotion prescribed below after each soaking - ammonium lactate (AMLACTIN) 12 % lotion; Apply to both feet twice daily for dry skin.  Dispense: 400 g; Refill: 6  5. Hemorrhoids, unspecified hemorrhoid type 6. Rectal bleed -Patient declines rectal exam today.  He is up-to-date with his colonoscopy which did not reveal any internal hemorrhoids.  Patient feels he has some external hemorrhoids.  Discussed the importance of keeping the bowel movements soft and regular.  He reports good results with Metamucil.  I told him he can use it several times a week.  I have given some Anusol cream to use as needed. - hydrocortisone (ANUSOL-HC) 2.5 % rectal cream; Place 1 application rectally 2 (two) times daily as needed for hemorrhoids or anal itching (for flare of hemorrhoids).  Dispense: 30 g; Refill: 1  7. History of Covid Pulse ox today on room air is good.  I did not get to walk him to check oxygen level with ambulation but patient reports his oxygen level at home has been good and he has not been using the oxygen.  I will write a prescription to have it removed from his home.  Patient was given the opportunity to ask questions.  Patient verbalized understanding of the plan and was able to repeat key elements of the plan.   Orders Placed This Encounter  Procedures  . CBC  . Comprehensive metabolic panel  . Lipid panel  . POCT glucose (manual entry)  . POCT glycosylated hemoglobin (Hb A1C)     Requested Prescriptions   Signed Prescriptions Disp Refills  . insulin glargine (LANTUS SOLOSTAR) 100 UNIT/ML Solostar Pen 15 mL 6    Sig:  Inject 38 Units into the skin at bedtime.  . hydrocortisone (ANUSOL-HC) 2.5 % rectal cream 30 g 1    Sig: Place 1 application rectally 2 (two) times daily as needed for hemorrhoids or anal itching (for flare of hemorrhoids).  Marland Kitchen ammonium lactate (AMLACTIN)  12 % lotion 400 g 6    Sig: Apply to both feet twice daily for dry skin.  Marland Kitchen amLODipine (NORVASC) 10 MG tablet 90 tablet 3    Sig: Take 1 tablet (10 mg total) by mouth daily.  . furosemide (LASIX) 20 MG tablet 90 tablet 2    Sig: Take 1 tablet (20 mg total) by mouth daily.    Return in about 2 months (around 01/16/2021) for Give appt with [facility] in 2 wks for BP recheck.  Karle Plumber, MD, FACP

## 2020-11-17 ENCOUNTER — Other Ambulatory Visit: Payer: Self-pay | Admitting: Internal Medicine

## 2020-11-17 DIAGNOSIS — I10 Essential (primary) hypertension: Secondary | ICD-10-CM

## 2020-11-17 LAB — COMPREHENSIVE METABOLIC PANEL
ALT: 13 IU/L (ref 0–44)
AST: 17 IU/L (ref 0–40)
Albumin/Globulin Ratio: 1.6 (ref 1.2–2.2)
Albumin: 3.8 g/dL (ref 3.8–4.8)
Alkaline Phosphatase: 88 IU/L (ref 44–121)
BUN/Creatinine Ratio: 17 (ref 10–24)
BUN: 24 mg/dL (ref 8–27)
Bilirubin Total: 0.5 mg/dL (ref 0.0–1.2)
CO2: 24 mmol/L (ref 20–29)
Calcium: 9 mg/dL (ref 8.6–10.2)
Chloride: 103 mmol/L (ref 96–106)
Creatinine, Ser: 1.41 mg/dL — ABNORMAL HIGH (ref 0.76–1.27)
Globulin, Total: 2.4 g/dL (ref 1.5–4.5)
Glucose: 287 mg/dL — ABNORMAL HIGH (ref 65–99)
Potassium: 4.4 mmol/L (ref 3.5–5.2)
Sodium: 142 mmol/L (ref 134–144)
Total Protein: 6.2 g/dL (ref 6.0–8.5)
eGFR: 56 mL/min/{1.73_m2} — ABNORMAL LOW (ref >59)

## 2020-11-17 LAB — LIPID PANEL
Chol/HDL Ratio: 3.4 ratio (ref 0.0–5.0)
Cholesterol, Total: 124 mg/dL (ref 100–199)
HDL: 36 mg/dL — ABNORMAL LOW (ref >39)
LDL Chol Calc (NIH): 63 mg/dL (ref 0–99)
Triglycerides: 145 mg/dL (ref 0–149)
VLDL Cholesterol Cal: 25 mg/dL (ref 5–40)

## 2020-11-17 LAB — CBC
Hematocrit: 31.1 % — ABNORMAL LOW (ref 37.5–51.0)
Hemoglobin: 10.3 g/dL — ABNORMAL LOW (ref 13.0–17.7)
MCH: 28.1 pg (ref 26.6–33.0)
MCHC: 33.1 g/dL (ref 31.5–35.7)
MCV: 85 fL (ref 79–97)
Platelets: 161 10*3/uL (ref 150–450)
RBC: 3.66 x10E6/uL — ABNORMAL LOW (ref 4.14–5.80)
RDW: 13.2 % (ref 11.6–15.4)
WBC: 6.3 10*3/uL (ref 3.4–10.8)

## 2020-11-17 MED ORDER — CARVEDILOL 3.125 MG PO TABS: 3.1250 mg | ORAL_TABLET | Freq: Two times a day (BID) | ORAL | 3 refills | Status: DC

## 2020-11-17 MED ORDER — FUROSEMIDE 20 MG PO TABS: 20.0000 mg | ORAL_TABLET | Freq: Every day | ORAL | 2 refills | Status: DC

## 2020-11-17 NOTE — Progress Notes (Signed)
Let pt know that his kidney function has improved compared to when he was in the hospital but not back to baseline.  Avoid taking all over the counter pain meds as these can make kidney function worse.  Okay to take Tylenol though.  He has some mild to moderate anemia that has persisted.  His cholesterol level is normal.  Liver function tests normal.  I recommend increasing furosemide from 20 mg once a day to 20 mg twice a day.  We will also add another blood pressure medication called carvedilol which he should also take twice a day.  I will send a prescription for the carvedilol to his pharmacy.  I will also send an updated prescription for the furosemide to his pharmacy.

## 2020-11-19 ENCOUNTER — Ambulatory Visit: Payer: Self-pay | Admitting: *Deleted

## 2020-11-19 NOTE — Telephone Encounter (Signed)
Noted  

## 2020-11-19 NOTE — Telephone Encounter (Signed)
Pt given lab results per notes of Dr. Wynetta Emery on 11/19/20. Pt verbalized understanding and was able to repeat back to NT recommendation to increase furosemide from 20 mg once a day to 20 mg twice a day and start carvedilol twice a day.

## 2020-11-28 ENCOUNTER — Telehealth: Payer: Self-pay | Admitting: Internal Medicine

## 2020-12-05 ENCOUNTER — Ambulatory Visit (INDEPENDENT_AMBULATORY_CARE_PROVIDER_SITE_OTHER): Payer: Medicare Other | Admitting: Ophthalmology

## 2020-12-05 ENCOUNTER — Other Ambulatory Visit: Payer: Self-pay

## 2020-12-05 ENCOUNTER — Encounter (INDEPENDENT_AMBULATORY_CARE_PROVIDER_SITE_OTHER): Payer: Self-pay | Admitting: Ophthalmology

## 2020-12-05 DIAGNOSIS — E113511 Type 2 diabetes mellitus with proliferative diabetic retinopathy with macular edema, right eye: Secondary | ICD-10-CM

## 2020-12-05 MED ORDER — BEVACIZUMAB 2.5 MG/0.1ML IZ SOSY
2.5000 mg | PREFILLED_SYRINGE | INTRAVITREAL | Status: AC | PRN
  Administered 2020-12-05: 2.5 mg via INTRAVITREAL

## 2020-12-05 NOTE — Assessment & Plan Note (Signed)
At 6 weeks follow-up, chronic recurrent CSME present, will repeat injection Avastin today

## 2020-12-05 NOTE — Progress Notes (Signed)
12/05/2020     CHIEF COMPLAINT Patient presents for Retina Follow Up (6wk fu OD/Possible avastin OD/Pt states VA OU stable since last visit. Pt denies FOL, floaters, or ocular pain OU.  /Pt reports using Brimonidine BID OD, Dorzolomide BID OD, Timolol BID OD, Rhopressa QHS OD, and Vyzulta QHS OD//A1C: 8.1/LBS: does not check)   HISTORY OF PRESENT ILLNESS: Derek Lowery is a 65 y.o. male who presents to the clinic today for:   HPI    Retina Follow Up    Patient presents with  Diabetic Retinopathy.  In right eye.  This started 6 weeks ago.  Duration of 6 weeks.  Since onset it is stable. Additional comments: 6wk fu OD/Possible avastin OD Pt states VA OU stable since last visit. Pt denies FOL, floaters, or ocular pain OU.   Pt reports using Brimonidine BID OD, Dorzolomide BID OD, Timolol BID OD, Rhopressa QHS OD, and Vyzulta QHS OD  A1C: 8.1 LBS: does not check       Last edited by Kendra Opitz, COA on 12/05/2020  9:46 AM. (History)      Referring physician: Ladell Pier, MD [place],  [place] [id number]  HISTORICAL INFORMATION:   Selected notes from the MEDICAL RECORD NUMBER    Lab Results  Component Value Date   HGBA1C 8.8 (A) 11/16/2020     CURRENT MEDICATIONS: Current Outpatient Medications (Ophthalmic Drugs)  Medication Sig  . brimonidine (ALPHAGAN) 0.2 % ophthalmic solution Place 1 drop into the right eye 2 (two) times daily.   . dorzolamide (TRUSOPT) 2 % ophthalmic solution Place 1 drop into the right eye 2 (two) times daily.  . RHOPRESSA 0.02 % SOLN Place 1 drop into the right eye at bedtime.  . timolol (BETIMOL) 0.5 % ophthalmic solution Place 1 drop into the right eye 2 (two) times daily.  Marland Kitchen VYZULTA 0.024 % SOLN Place 1 drop into the right eye daily.    No current facility-administered medications for this visit. (Ophthalmic Drugs)   Current Outpatient Medications (Other)  Medication Sig  . albuterol (VENTOLIN HFA) 108 (90 Base)  MCG/ACT inhaler Inhale 1-2 puffs into the lungs every 6 (six) hours as needed for wheezing or shortness of breath.   Marland Kitchen amLODipine (NORVASC) 10 MG tablet Take 1 tablet (10 mg total) by mouth daily.  Marland Kitchen ammonium lactate (AMLACTIN) 12 % lotion Apply to both feet twice daily for dry skin.  Marland Kitchen aspirin EC 81 MG tablet Take 1 tablet (81 mg total) by mouth daily.  Marland Kitchen atorvastatin (LIPITOR) 40 MG tablet TAKE 1 TABLET BY MOUTH EVERY DAY  . blood glucose meter kit and supplies KIT Dispense based on patient and insurance preference. Use up to four times daily as directed. One Touch Verio  . carvedilol (COREG) 3.125 MG tablet Take 1 tablet (3.125 mg total) by mouth 2 (two) times daily with a meal.  . Continuous Blood Gluc Receiver (DEXCOM G6 RECEIVER) DEVI 1 Device by Does not apply route daily.  . Continuous Blood Gluc Sensor (DEXCOM G6 SENSOR) MISC 1 packet by Does not apply route daily.  . Continuous Blood Gluc Transmit (DEXCOM G6 TRANSMITTER) MISC 1 packet by Does not apply route daily.  . furosemide (LASIX) 20 MG tablet Take 1 tablet (20 mg total) by mouth daily.  Marland Kitchen glucose blood (ACCU-CHEK AVIVA PLUS) test strip Use as instructed for 3 times daily testing of blood sugar. E11.9  . glucose monitoring kit (FREESTYLE) monitoring kit 1 each  by Does not apply route as needed for other.  . hydrocortisone (ANUSOL-HC) 2.5 % rectal cream Place 1 application rectally 2 (two) times daily as needed for hemorrhoids or anal itching (for flare of hemorrhoids).  . insulin glargine (LANTUS SOLOSTAR) 100 UNIT/ML Solostar Pen Inject 38 Units into the skin at bedtime.  . insulin lispro (HUMALOG KWIKPEN) 100 UNIT/ML KwikPen Inject 10 Units into the skin 3 (three) times daily with meals.  . Insulin Pen Needle (BD PEN NEEDLE NANO U/F) 32G X 4 MM MISC USE AS DIRECTED 3 TIMES A DAY. Please schedule an appointment for additional refills.  . Lancets MISC Use as directed.  Accu chek 2  . methylPREDNISolone (MEDROL DOSEPAK) 4 MG TBPK  tablet follow package directions  . Multiple Vitamins-Minerals (MULTIVITAMIN WITH MINERALS) tablet Take 1 tablet by mouth daily.   No current facility-administered medications for this visit. (Other)      REVIEW OF SYSTEMS:    ALLERGIES No Known Allergies  PAST MEDICAL HISTORY Past Medical History:  Diagnosis Date  . Arthritis   . CAD (coronary artery disease)    NSTEMI 06/2011:  LHC 07/18/11: Proximal diagonal 60%, distal LAD with a diabetic appearance and 60% stenosis, OM2 with an occluded superior branch and an inferior branch with 90%, EF 55% with inferior hypokinesis.  PCI: Promus DES to the OM2 inferior branch.  This vessel provides collaterals to the superior branch which remained occluded.  Echocardiogram 07/18/11: EF 60%, normal wall motion.  . Cataract   . Essential hypertension, benign   . Glaucoma   . Hypercholesteremia   . Internal hemorrhoids   . Noncompliance   . Type 2 diabetes mellitus ([place]) 1990   Past Surgical History:  Procedure Laterality Date  . CATARACT EXTRACTION Right 2019   Dr. Katy Fitch  . CHOLECYSTECTOMY N/A 09/21/2017   Procedure: LAPAROSCOPIC CHOLECYSTECTOMY WITH INTRAOPERATIVE CHOLANGIOGRAM;  Surgeon: Michael Boston, MD;  Location: [facility];  Service: General;  Laterality: N/A;  . COLONOSCOPY  2000   Dr. Collene Mares  . EYE SURGERY    . IRRIGATION AND DEBRIDEMENT ABSCESS Left 05/05/2014   Procedure: IRRIGATION AND DEBRIDEMENT ABSCESS left buttock;  Surgeon: Michael Boston, MD;  Location: [facility];  Service: General;  Laterality: Left;  . LEFT HEART CATHETERIZATION WITH CORONARY ANGIOGRAM N/A 07/18/2011   Procedure: LEFT HEART CATHETERIZATION WITH CORONARY ANGIOGRAM;  Surgeon: Hillary Bow, MD;  Location: [facility] CATH LAB;  Service: Cardiovascular;  Laterality: N/A;  . PERCUTANEOUS CORONARY STENT INTERVENTION (PCI-S)  07/18/2011   Procedure: PERCUTANEOUS CORONARY STENT INTERVENTION (PCI-S);  Surgeon: Hillary Bow, MD;  Location: [facility] CATH LAB;  Service:  Cardiovascular;;    FAMILY HISTORY Family History  Problem Relation Age of Onset  . Coronary artery disease Father        Developed in his 92s  . Kidney disease Father   . Diabetes Father   . Melanoma Father   . Rectal cancer Father   . Heart failure Mother   . Hypertension Mother   . Diabetes Sister   . Diabetes Brother   . Colon cancer Neg Hx   . Esophageal cancer Neg Hx   . Stomach cancer Neg Hx     SOCIAL HISTORY Social History   Tobacco Use  . Smoking status: Never Smoker  . Smokeless tobacco: Never Used  Vaping Use  . Vaping Use: Never used  Substance Use Topics  . Alcohol use: No  . Drug use: No         OPHTHALMIC EXAM:  Base Eye Exam    Visual Acuity (ETDRS)      Right Left   Dist [place] 20/40 NLP   Dist ph [place] NI    Correction: Glasses       Tonometry (Tonopen, 9:49 AM)      Right Left   Pressure 15 30       Tonometry #2 (Tonopen, 9:50 AM)      Right Left   Pressure  30       Pupils      Pupils Dark Light Shape React APD   Right PERRL 3 3 Irregular Minimal None   Left PERRL 4 4 Round Minimal +1       Visual Fields      Left Right   Restrictions Total superior temporal, inferior temporal, superior nasal, inferior nasal deficiencies Total inferior temporal deficiency       Neuro/Psych    Oriented x3: Yes   Mood/Affect: Normal       Dilation    Right eye: 1.0% Mydriacyl, 2.5% Phenylephrine @ 9:52 AM        Slit Lamp and Fundus Exam    External Exam      Right Left   External Normal Normal       Slit Lamp Exam      Right Left   Lids/Lashes Normal Normal   Conjunctiva/Sclera White and quiet, no exposure of tube White and quiet   Cornea Clear Clear   Anterior Chamber ,, Tube 12 meridian Deep   Iris Old NVI large trunks, no signs of new  NVI Old neovascularization of the iris 360 degrees, stable nonprogressive   Lens Posterior chamber intraocular lens 3+ Nuclear sclerosis   Anterior Vitreous Normal        Fundus Exam       Right Left   Posterior Vitreous Posterior vitreous detachment    Disc Normal    C/D Ratio 0.7    Macula Microaneurysms,, Retinal pigment epithelial atrophy, Macular thickening, Mild clinically significant macular edema    Vessels Proliferative diabetic retinopathy, quiet    Periphery  good panretinal photocoagulation, some retina remains posteriorly that could receive PRP           IMAGING AND PROCEDURES  Imaging and Procedures for 12/05/20  OCT, Retina - OU - Both Eyes       Right Eye Quality was good. Scan locations included subfoveal. Central Foveal Thickness: 311. Progression has been stable. Findings include abnormal foveal contour.   Notes Chronic CSME, involved OD, repeat injection intravitreal Avastin today       Intravitreal Injection, Pharmacologic Agent - OD - Right Eye       Time Out 12/05/2020. 10:45 AM. Confirmed correct patient, procedure, site, and patient consented.   Anesthesia Topical anesthesia was used. Anesthetic medications included [place] 3.5%.   Procedure Preparation included Tobramycin 0.3%, 10% betadine to eyelids, 5% betadine to ocular surface. A 30 gauge needle was used.   Injection:  2.5 mg Bevacizumab (AVASTIN) 2.9m/0.1mL SOSY   NDC:: 37048-889-16 Lot:: [id number]  Route: Intravitreal, Site: Right Eye  Post-op Post injection exam found visual acuity of at least counting fingers. The patient tolerated the procedure well. There were no complications. The patient received written and verbal post procedure care education. Post injection medications were not given.                 ASSESSMENT/PLAN:  Proliferative diabetic retinopathy of right eye with macular edema associated with type 2 diabetes  mellitus ([place]) At 6 weeks follow-up, chronic recurrent CSME present, will repeat injection Avastin today      ICD-10-CM   1. Proliferative diabetic retinopathy of right eye with macular edema associated with type 2 diabetes mellitus (HCC)   E11.3511 OCT, Retina - OU - Both Eyes    Intravitreal Injection, Pharmacologic Agent - OD - Right Eye    bevacizumab (AVASTIN) SOSY 2.5 mg    1.  OD, vastly improved and stable overall, with recurrences of iris neovascularization should patient skip a injection of vitreal Avastin every 6 to 8 weeks.  CSME also now present.  We will repeat injection today to maintain and stabilize monocular status  2.  3.  Ophthalmic Meds Ordered this visit:  Meds ordered this encounter  Medications  . bevacizumab (AVASTIN) SOSY 2.5 mg       Return in about 7 weeks (around 01/23/2021) for dilate, OD, AVASTIN OCT.  There are no Patient Instructions on file for this visit.   Explained the diagnoses, plan, and follow up with the patient and they expressed understanding.  Patient expressed understanding of the importance of proper follow up care.   Clent Demark Sang Blount M.D. Diseases & Surgery of the Retina and Vitreous Retina & Diabetic [place] 12/05/20     Abbreviations: M myopia (nearsighted); A astigmatism; H hyperopia (farsighted); P presbyopia; Mrx spectacle prescription;  CTL contact lenses; OD right eye; OS left eye; OU both eyes  XT exotropia; ET esotropia; PEK punctate epithelial keratitis; PEE punctate epithelial erosions; DES dry eye syndrome; MGD meibomian gland dysfunction; ATs artificial tears; PFAT's preservative free artificial tears; [place] nuclear sclerotic cataract; PSC posterior subcapsular cataract; ERM epi-retinal membrane; PVD posterior vitreous detachment; RD retinal detachment; DM diabetes mellitus; DR diabetic retinopathy; NPDR non-proliferative diabetic retinopathy; PDR proliferative diabetic retinopathy; CSME clinically significant macular edema; DME diabetic macular edema; dbh dot blot hemorrhages; CWS cotton wool spot; POAG primary open angle glaucoma; C/D cup-to-disc ratio; HVF humphrey visual field; GVF goldmann visual field; OCT optical coherence tomography; IOP intraocular  pressure; BRVO Branch retinal vein occlusion; CRVO central retinal vein occlusion; CRAO central retinal artery occlusion; BRAO branch retinal artery occlusion; RT retinal tear; SB scleral buckle; PPV pars plana vitrectomy; VH Vitreous hemorrhage; PRP panretinal laser photocoagulation; IVK intravitreal kenalog; VMT vitreomacular traction; MH Macular hole;  NVD neovascularization of the disc; NVE neovascularization elsewhere; AREDS age related eye disease study; ARMD age related macular degeneration; POAG primary open angle glaucoma; EBMD epithelial/anterior basement membrane dystrophy; ACIOL anterior chamber intraocular lens; IOL intraocular lens; PCIOL posterior chamber intraocular lens; Phaco/IOL phacoemulsification with intraocular lens placement; [place] photorefractive keratectomy; LASIK laser assisted in situ keratomileusis; HTN hypertension; DM diabetes mellitus; COPD chronic obstructive pulmonary disease

## 2020-12-12 ENCOUNTER — Ambulatory Visit: Payer: Self-pay | Admitting: *Deleted

## 2020-12-12 MED ORDER — HYDRALAZINE HCL 50 MG PO TABS: 50.0000 mg | ORAL_TABLET | Freq: Three times a day (TID) | ORAL | 2 refills | Status: DC

## 2020-12-12 NOTE — Telephone Encounter (Signed)
I have switched his carvedilol to hydralazine and a new prescription sent to his pharmacy.

## 2020-12-12 NOTE — Telephone Encounter (Signed)
Patient called and reported he is having an allergic reaction to carevidlol,   causing swelling in bilateral calves and bright red. Patient reports he has swelling in bilateral calves x 2 weeks since starting carvedilol. Reports swelling comes and goes when he does not take carvedilol. C/o some dizziness , lightheadedness since taking medication. Denies chest pain, difficulty breathing. Reports swelling in calves bright red and "hard" to touch. Denies rash to legs. Denies swelling in thighs as originally reported. Patient reports he stopped taking coreg for a day or two and redness and swelling went down, but noted his B/P went up. Patient started taking coreg again and calves swelled again. Encouraged patient to go to UC or ED , no available appt with PCP. Patient already scheduled appt with PCP in June. Contacted FC at clinic and no available appt with any provider today. Mobile unit closed today. Care advise given. Patient verbalized understanding of care advise and to call back or go to [facility]  for assessment of calf swelling or ED if symptoms worsen.   Reason for Disposition  [1] Thigh, calf, or ankle swelling AND [2] bilateral AND [3] 1 side is more swollen  Answer Assessment - Initial Assessment Questions 1. ONSET: "When did the swelling start?" (e.g., minutes, hours, days)     2 weeks ago when started carvedilol 2. LOCATION: "What part of the leg is swollen?"  "Are both legs swollen or just one leg?"     Bilateral calves 3. SEVERITY: "How bad is the swelling?" (e.g., localized; mild, moderate, severe)  - Localized - small area of swelling localized to one leg  - MILD pedal edema - swelling limited to foot and ankle, pitting edema < 1/4 inch (6 mm) deep, rest and elevation eliminate most or all swelling  - MODERATE edema - swelling of lower leg to knee, pitting edema > 1/4 inch (6 mm) deep, rest and elevation only partially reduce swelling  - SEVERE edema - swelling extends above knee, facial or  hand swelling present     Localized to calves 4. REDNESS: "Does the swelling look red or infected?"     Redness  5. PAIN: "Is the swelling painful to touch?" If Yes, ask: "How painful is it?"   (Scale 1-10; mild, moderate or severe)     Denies  6. FEVER: "Do you have a fever?" If Yes, ask: "What is it, how was it measured, and when did it start?"      na 7. CAUSE: "What do you think is causing the leg swelling?"     New medication carvedilol 8. MEDICAL HISTORY: "Do you have a history of heart failure, kidney disease, liver failure, or cancer?"     High B/P 9. RECURRENT SYMPTOM: "Have you had leg swelling before?" If Yes, ask: "When was the last time?" "What happened that time?"     No  10. OTHER SYMPTOMS: "Do you have any other symptoms?" (e.g., chest pain, difficulty breathing)       denies 11. PREGNANCY: "Is there any chance you are pregnant?" "When was your last menstrual period?"       na  Protocols used: LEG SWELLING AND EDEMA-A-AH

## 2020-12-12 NOTE — Telephone Encounter (Signed)
Will forward to covering provider.

## 2020-12-13 NOTE — Telephone Encounter (Signed)
Contacted pt to go over provider response pt is aware and doesn't have any questions or concerns

## 2020-12-19 ENCOUNTER — Encounter: Payer: Self-pay | Admitting: Internal Medicine

## 2020-12-19 ENCOUNTER — Other Ambulatory Visit: Payer: Self-pay | Admitting: Internal Medicine

## 2020-12-19 ENCOUNTER — Telehealth: Payer: Self-pay | Admitting: Internal Medicine

## 2020-12-19 DIAGNOSIS — I10 Essential (primary) hypertension: Secondary | ICD-10-CM

## 2020-12-19 DIAGNOSIS — Z794 Long term (current) use of insulin: Secondary | ICD-10-CM

## 2020-12-19 DIAGNOSIS — E1142 Type 2 diabetes mellitus with diabetic polyneuropathy: Secondary | ICD-10-CM

## 2020-12-19 MED ORDER — FREESTYLE LIBRE READER DEVI: 1.0000 | Freq: Once | 0 refills | Status: DC

## 2020-12-19 MED ORDER — FUROSEMIDE 20 MG PO TABS: 20.0000 mg | ORAL_TABLET | Freq: Two times a day (BID) | ORAL | 2 refills | Status: DC

## 2020-12-19 MED ORDER — DEXCOM G6 RECEIVER DEVI: 1.0000 | Freq: Every day | 0 refills | Status: DC

## 2020-12-19 MED ORDER — DEXCOM G6 SENSOR MISC: 1.0000 | Freq: Every day | 11 refills | Status: DC

## 2020-12-19 MED ORDER — FREESTYLE LIBRE SENSOR SYSTEM MISC: 12 refills | Status: DC

## 2020-12-19 MED ORDER — DEXCOM G6 TRANSMITTER MISC: 1.0000 | Freq: Every day | 4 refills | Status: DC

## 2020-12-19 NOTE — Telephone Encounter (Signed)
Requested medication (s) are due for refill today:   Prescribed today  Requested medication (s) are on the active medication list:   Freestyle Libre 14 day   Future visit scheduled:   Yes with the pharmacist   Last ordered: Today by Dr. Wynetta Emery.  Pharmacy requesting an alternative.   This is not covered .   Requested Prescriptions  Pending Prescriptions Disp Refills   Continuous Blood Gluc Receiver (FREESTYLE LIBRE 62 DAY READER) [place] [Pharmacy Med Name: FREESTYLE LIBRE 14 DAY READER] 1 each 0    Sig: USE AS DIRECTED      Endocrinology: Diabetes - Testing Supplies Passed - 12/19/2020 12:24 PM      Passed - Valid encounter within last 12 months    Recent Outpatient Visits           1 month ago Type 2 diabetes mellitus with diabetic polyneuropathy, with long-term current use of insulin ([place])   [place] Ladell Pier, MD   5 months ago Hospital discharge follow-up   [place], Deborah B, MD   1 year ago Type 2 diabetes mellitus with diabetic polyneuropathy, with long-term current use of insulin [facility])   [place], [place]   1 year ago DM type 2 with diabetic peripheral neuropathy [facility])   [place], [place]   1 year ago DM type 2 with diabetic peripheral neuropathy [facility])   [place], Deborah B, MD       Future Appointments             In 4 weeks Tresa Endo, [place]

## 2020-12-19 NOTE — Telephone Encounter (Signed)
Called Pt no answer. Left VM that Per Dr. Wynetta Emery pt need to schedule a medicare wellness visit with her in June and to call [phone] to schedule

## 2020-12-24 ENCOUNTER — Encounter (HOSPITAL_COMMUNITY): Payer: Self-pay | Admitting: Emergency Medicine

## 2020-12-24 ENCOUNTER — Emergency Department (HOSPITAL_COMMUNITY): Payer: Medicare Other

## 2020-12-24 ENCOUNTER — Inpatient Hospital Stay (HOSPITAL_COMMUNITY)
Admission: EM | Admit: 2020-12-24 | Discharge: 2020-12-30 | DRG: 291 | Disposition: A | Discharge status: Home / Self Care | Payer: Medicare Other | Attending: Internal Medicine | Admitting: Internal Medicine

## 2020-12-24 ENCOUNTER — Other Ambulatory Visit: Payer: Self-pay

## 2020-12-24 DIAGNOSIS — I5043 Acute on chronic combined systolic (congestive) and diastolic (congestive) heart failure: Secondary | ICD-10-CM | POA: Diagnosis not present

## 2020-12-24 DIAGNOSIS — N179 Acute kidney failure, unspecified: Secondary | ICD-10-CM | POA: Diagnosis not present

## 2020-12-24 DIAGNOSIS — N183 Chronic kidney disease, stage 3 unspecified: Secondary | ICD-10-CM

## 2020-12-24 DIAGNOSIS — E785 Hyperlipidemia, unspecified: Secondary | ICD-10-CM | POA: Diagnosis present

## 2020-12-24 DIAGNOSIS — Z9119 Patient's noncompliance with other medical treatment and regimen: Secondary | ICD-10-CM | POA: Diagnosis not present

## 2020-12-24 DIAGNOSIS — N1831 Chronic kidney disease, stage 3a: Secondary | ICD-10-CM

## 2020-12-24 DIAGNOSIS — D638 Anemia in other chronic diseases classified elsewhere: Secondary | ICD-10-CM | POA: Diagnosis present

## 2020-12-24 DIAGNOSIS — I5021 Acute systolic (congestive) heart failure: Secondary | ICD-10-CM | POA: Diagnosis not present

## 2020-12-24 DIAGNOSIS — I130 Hypertensive heart and chronic kidney disease with heart failure and stage 1 through stage 4 chronic kidney disease, or unspecified chronic kidney disease: Principal | ICD-10-CM | POA: Diagnosis present

## 2020-12-24 DIAGNOSIS — M1990 Unspecified osteoarthritis, unspecified site: Secondary | ICD-10-CM | POA: Diagnosis not present

## 2020-12-24 DIAGNOSIS — R0609 Other forms of dyspnea: Secondary | ICD-10-CM | POA: Diagnosis not present

## 2020-12-24 DIAGNOSIS — K5900 Constipation, unspecified: Secondary | ICD-10-CM | POA: Diagnosis present

## 2020-12-24 DIAGNOSIS — Z20822 Contact with and (suspected) exposure to covid-19: Secondary | ICD-10-CM | POA: Diagnosis present

## 2020-12-24 DIAGNOSIS — I252 Old myocardial infarction: Secondary | ICD-10-CM | POA: Diagnosis not present

## 2020-12-24 DIAGNOSIS — I10 Essential (primary) hypertension: Secondary | ICD-10-CM | POA: Diagnosis not present

## 2020-12-24 DIAGNOSIS — E1142 Type 2 diabetes mellitus with diabetic polyneuropathy: Secondary | ICD-10-CM | POA: Diagnosis present

## 2020-12-24 DIAGNOSIS — H548 Legal blindness, as defined in USA: Secondary | ICD-10-CM | POA: Diagnosis present

## 2020-12-24 DIAGNOSIS — I110 Hypertensive heart disease with heart failure: Secondary | ICD-10-CM | POA: Diagnosis not present

## 2020-12-24 DIAGNOSIS — Z8616 Personal history of COVID-19: Secondary | ICD-10-CM | POA: Diagnosis not present

## 2020-12-24 DIAGNOSIS — D509 Iron deficiency anemia, unspecified: Secondary | ICD-10-CM | POA: Diagnosis present

## 2020-12-24 DIAGNOSIS — E1122 Type 2 diabetes mellitus with diabetic chronic kidney disease: Secondary | ICD-10-CM | POA: Diagnosis present

## 2020-12-24 DIAGNOSIS — Z794 Long term (current) use of insulin: Secondary | ICD-10-CM

## 2020-12-24 DIAGNOSIS — I2510 Atherosclerotic heart disease of native coronary artery without angina pectoris: Secondary | ICD-10-CM | POA: Diagnosis not present

## 2020-12-24 DIAGNOSIS — E113511 Type 2 diabetes mellitus with proliferative diabetic retinopathy with macular edema, right eye: Secondary | ICD-10-CM | POA: Diagnosis present

## 2020-12-24 DIAGNOSIS — R0602 Shortness of breath: Secondary | ICD-10-CM | POA: Diagnosis not present

## 2020-12-24 DIAGNOSIS — R609 Edema, unspecified: Secondary | ICD-10-CM | POA: Diagnosis not present

## 2020-12-24 DIAGNOSIS — Z955 Presence of coronary angioplasty implant and graft: Secondary | ICD-10-CM

## 2020-12-24 DIAGNOSIS — I25118 Atherosclerotic heart disease of native coronary artery with other forms of angina pectoris: Secondary | ICD-10-CM | POA: Diagnosis present

## 2020-12-24 DIAGNOSIS — I5033 Acute on chronic diastolic (congestive) heart failure: Secondary | ICD-10-CM | POA: Diagnosis present

## 2020-12-24 DIAGNOSIS — D649 Anemia, unspecified: Secondary | ICD-10-CM | POA: Diagnosis not present

## 2020-12-24 DIAGNOSIS — H409 Unspecified glaucoma: Secondary | ICD-10-CM | POA: Diagnosis not present

## 2020-12-24 DIAGNOSIS — R0902 Hypoxemia: Secondary | ICD-10-CM | POA: Diagnosis not present

## 2020-12-24 DIAGNOSIS — I509 Heart failure, unspecified: Secondary | ICD-10-CM | POA: Diagnosis not present

## 2020-12-24 DIAGNOSIS — E1169 Type 2 diabetes mellitus with other specified complication: Secondary | ICD-10-CM

## 2020-12-24 DIAGNOSIS — I214 Non-ST elevation (NSTEMI) myocardial infarction: Secondary | ICD-10-CM | POA: Diagnosis present

## 2020-12-24 DIAGNOSIS — E118 Type 2 diabetes mellitus with unspecified complications: Secondary | ICD-10-CM

## 2020-12-24 LAB — CBC WITH DIFFERENTIAL/PLATELET
Abs Immature Granulocytes: 0.02 10*3/uL (ref 0.00–0.07)
Basophils Absolute: 0.1 10*3/uL (ref 0.0–0.1)
Basophils Relative: 1 %
Eosinophils Absolute: 0.2 10*3/uL (ref 0.0–0.5)
Eosinophils Relative: 3 %
HCT: 27.1 % — ABNORMAL LOW (ref 39.0–52.0)
Hemoglobin: 8.5 g/dL — ABNORMAL LOW (ref 13.0–17.0)
Immature Granulocytes: 0 %
Lymphocytes Relative: 12 %
Lymphs Abs: 0.9 10*3/uL (ref 0.7–4.0)
MCH: 27 pg (ref 26.0–34.0)
MCHC: 31.4 g/dL (ref 30.0–36.0)
MCV: 86 fL (ref 80.0–100.0)
Monocytes Absolute: 0.8 10*3/uL (ref 0.1–1.0)
Monocytes Relative: 11 %
Neutro Abs: 5.5 10*3/uL (ref 1.7–7.7)
Neutrophils Relative %: 73 %
Platelets: 167 10*3/uL (ref 150–400)
RBC: 3.15 MIL/uL — ABNORMAL LOW (ref 4.22–5.81)
RDW: 14.3 % (ref 11.5–15.5)
WBC: 7.4 10*3/uL (ref 4.0–10.5)
nRBC: 0 % (ref 0.0–0.2)

## 2020-12-24 LAB — BASIC METABOLIC PANEL
Anion gap: 9 (ref 5–15)
BUN: 23 mg/dL (ref 8–23)
CO2: 24 mmol/L (ref 22–32)
Calcium: 8.6 mg/dL — ABNORMAL LOW (ref 8.9–10.3)
Chloride: 104 mmol/L (ref 98–111)
Creatinine, Ser: 1.69 mg/dL — ABNORMAL HIGH (ref 0.61–1.24)
GFR, Estimated: 45 mL/min — ABNORMAL LOW (ref >60)
Glucose, Bld: 174 mg/dL — ABNORMAL HIGH (ref 70–99)
Potassium: 3.6 mmol/L (ref 3.5–5.1)
Sodium: 137 mmol/L (ref 135–145)

## 2020-12-24 LAB — BRAIN NATRIURETIC PEPTIDE: B Natriuretic Peptide: 404.4 pg/mL — ABNORMAL HIGH (ref 0.0–100.0)

## 2020-12-24 MED ORDER — FUROSEMIDE 10 MG/ML IJ SOLN
40.0000 mg | Freq: Two times a day (BID) | INTRAMUSCULAR | Status: DC
  Administered 2020-12-25 – 2020-12-28 (×8): 40 mg via INTRAVENOUS
  Filled 2020-12-24 (×8): qty 4

## 2020-12-24 MED ORDER — ALBUTEROL SULFATE HFA 108 (90 BASE) MCG/ACT IN AERS: 1.0000 | INHALATION_SPRAY | Freq: Four times a day (QID) | RESPIRATORY_TRACT | Status: DC | PRN

## 2020-12-24 MED ORDER — INSULIN ASPART 100 UNIT/ML IJ SOLN
0.0000 [IU] | Freq: Three times a day (TID) | INTRAMUSCULAR | Status: DC
  Administered 2020-12-25: 3 [IU] via SUBCUTANEOUS
  Administered 2020-12-27 – 2020-12-28 (×3): 2 [IU] via SUBCUTANEOUS
  Administered 2020-12-29: 3 [IU] via SUBCUTANEOUS
  Administered 2020-12-30 (×2): 2 [IU] via SUBCUTANEOUS

## 2020-12-24 MED ORDER — FUROSEMIDE 10 MG/ML IJ SOLN
40.0000 mg | Freq: Once | INTRAMUSCULAR | Status: AC
  Administered 2020-12-24: 40 mg via INTRAVENOUS
  Filled 2020-12-24: qty 4

## 2020-12-24 MED ORDER — NETARSUDIL DIMESYLATE 0.02 % OP SOLN
1.0000 [drp] | Freq: Every day | OPHTHALMIC | Status: DC
  Administered 2020-12-25 – 2020-12-29 (×5): 1 [drp] via OPHTHALMIC

## 2020-12-24 MED ORDER — ACETAMINOPHEN 325 MG PO TABS
650.0000 mg | ORAL_TABLET | Freq: Four times a day (QID) | ORAL | Status: DC | PRN
  Administered 2020-12-25 (×2): 650 mg via ORAL
  Filled 2020-12-24 (×2): qty 2

## 2020-12-24 MED ORDER — HYDRALAZINE HCL 50 MG PO TABS
50.0000 mg | ORAL_TABLET | Freq: Three times a day (TID) | ORAL | Status: DC
  Administered 2020-12-24 – 2020-12-30 (×17): 50 mg via ORAL
  Filled 2020-12-24 (×17): qty 1

## 2020-12-24 MED ORDER — LATANOPROST 0.005 % OP SOLN
1.0000 [drp] | Freq: Every day | OPHTHALMIC | Status: DC
  Administered 2020-12-25 – 2020-12-29 (×6): 1 [drp] via OPHTHALMIC
  Filled 2020-12-24 (×2): qty 2.5

## 2020-12-24 MED ORDER — INSULIN ASPART 100 UNIT/ML IJ SOLN: 0.0000 [IU] | Freq: Every day | INTRAMUSCULAR | Status: DC

## 2020-12-24 MED ORDER — SODIUM CHLORIDE 0.9% FLUSH
3.0000 mL | Freq: Two times a day (BID) | INTRAVENOUS | Status: DC
  Administered 2020-12-25 – 2020-12-29 (×10): 3 mL via INTRAVENOUS

## 2020-12-24 MED ORDER — POLYETHYLENE GLYCOL 3350 17 G PO PACK
17.0000 g | PACK | Freq: Every day | ORAL | Status: DC | PRN
  Administered 2020-12-25: 17 g via ORAL
  Filled 2020-12-24: qty 1

## 2020-12-24 MED ORDER — INSULIN ASPART 100 UNIT/ML IJ SOLN
5.0000 [IU] | Freq: Three times a day (TID) | INTRAMUSCULAR | Status: DC
  Administered 2020-12-25 – 2020-12-30 (×16): 5 [IU] via SUBCUTANEOUS

## 2020-12-24 MED ORDER — ACETAMINOPHEN 650 MG RE SUPP: 650.0000 mg | Freq: Four times a day (QID) | RECTAL | Status: DC | PRN

## 2020-12-24 MED ORDER — DORZOLAMIDE HCL 2 % OP SOLN
1.0000 [drp] | Freq: Two times a day (BID) | OPHTHALMIC | Status: DC
  Administered 2020-12-25 – 2020-12-30 (×12): 1 [drp] via OPHTHALMIC
  Filled 2020-12-24: qty 10

## 2020-12-24 MED ORDER — AMLODIPINE BESYLATE 10 MG PO TABS
10.0000 mg | ORAL_TABLET | Freq: Every day | ORAL | Status: DC
  Administered 2020-12-25: 10 mg via ORAL
  Filled 2020-12-24: qty 1

## 2020-12-24 MED ORDER — INSULIN GLARGINE 100 UNIT/ML ~~LOC~~ SOLN
25.0000 [IU] | Freq: Every day | SUBCUTANEOUS | Status: DC
  Administered 2020-12-25 – 2020-12-29 (×6): 25 [IU] via SUBCUTANEOUS
  Filled 2020-12-24 (×7): qty 0.25

## 2020-12-24 MED ORDER — ASPIRIN EC 81 MG PO TBEC
81.0000 mg | DELAYED_RELEASE_TABLET | Freq: Every day | ORAL | Status: DC
  Administered 2020-12-25 – 2020-12-30 (×6): 81 mg via ORAL
  Filled 2020-12-24 (×6): qty 1

## 2020-12-24 MED ORDER — ATORVASTATIN CALCIUM 40 MG PO TABS
40.0000 mg | ORAL_TABLET | Freq: Every day | ORAL | Status: DC
  Administered 2020-12-25 – 2020-12-30 (×6): 40 mg via ORAL
  Filled 2020-12-24 (×6): qty 1

## 2020-12-24 NOTE — ED Triage Notes (Signed)
Pt BIB [facility] from home c/o SOB on exertion and bilateral LE edema. PCP increased pts dose of Lasix recently and pt reports no improvement. Pt has had these sx for one month. Pt reports no known diagnosis of CHF, but states he is unsure of why he takes Lasix.   EMS VS- BP 140/70, HR 90, RR 18, SpO2 96%

## 2020-12-24 NOTE — H&P (Addendum)
History and Physical   Derek Lowery GBT:[id number] DOB: 10/29/1955 DOA: 12/24/2020  PCP: Ladell Pier, MD   Patient coming from: Home  Chief Complaint: Shortness of breath, edema  HPI: Derek Lowery is a 65 y.o. male with medical history significant of anemia, BP PV, chronic diarrhea, CAD, diabetes, hypertension, hyperlipidemia, glaucoma, legal blindness, CKD 3A, hemorrhoids who presents with worsening edema and shortness of breath.  Patient states that he has had ongoing lower extremity edema for period time he has been on diuretics for this but he is unclear what the diagnosis is.  He does not believe he is had a previous diagnosis of heart failure.  He had increasing lower extremity edema for about the past month and his PCP increased the dose but he did not have much improvement with this.  He is also had some increasing shortness of breath throughout the past 2 weeks.  He has dyspnea on exertion.  Denies orthopnea.  Reports some constipation.  He denies fevers, chills, chest pain, abdominal pain, nausea, vomiting.  ED Course: Vital signs in the ED significant for blood pressure in the 710G 269S systolic.  Lab work-up showed BMP with creatinine 1.6 and glucose is 174.  Calcium 8.6.  CBC showing hemoglobin down to 8.5 from a baseline of around 10.5.  BNP elevated to 4 4.  FOBT pending, respiratory panel flu and COVID pending.  Chest x-ray showing increased interstitial markings with moderate areas of scarring in the bilateral lower lobes possibility of atelectasis or infiltrate remains.  Patient given dose of IV Lasix in the ED.  Review of Systems: As per HPI otherwise all other systems reviewed and are negative.  Past Medical History:  Diagnosis Date  . Arthritis   . CAD (coronary artery disease)    NSTEMI 06/2011:  LHC 07/18/11: Proximal diagonal 60%, distal LAD with a diabetic appearance and 60% stenosis, OM2 with an occluded superior branch and an inferior branch with 90%, EF  55% with inferior hypokinesis.  PCI: Promus DES to the OM2 inferior branch.  This vessel provides collaterals to the superior branch which remained occluded.  Echocardiogram 07/18/11: EF 60%, normal wall motion.  . Cataract   . Essential hypertension, benign   . Glaucoma   . Hypercholesteremia   . Internal hemorrhoids   . Noncompliance   . Type 2 diabetes mellitus ([place]) 1990    Past Surgical History:  Procedure Laterality Date  . CATARACT EXTRACTION Right 2019   Dr. Katy Fitch  . CHOLECYSTECTOMY N/A 09/21/2017   Procedure: LAPAROSCOPIC CHOLECYSTECTOMY WITH INTRAOPERATIVE CHOLANGIOGRAM;  Surgeon: Michael Boston, MD;  Location: [facility];  Service: General;  Laterality: N/A;  . COLONOSCOPY  2000   Dr. Collene Mares  . EYE SURGERY    . IRRIGATION AND DEBRIDEMENT ABSCESS Left 05/05/2014   Procedure: IRRIGATION AND DEBRIDEMENT ABSCESS left buttock;  Surgeon: Michael Boston, MD;  Location: [facility];  Service: General;  Laterality: Left;  . LEFT HEART CATHETERIZATION WITH CORONARY ANGIOGRAM N/A 07/18/2011   Procedure: LEFT HEART CATHETERIZATION WITH CORONARY ANGIOGRAM;  Surgeon: Hillary Bow, MD;  Location: [facility] CATH LAB;  Service: Cardiovascular;  Laterality: N/A;  . PERCUTANEOUS CORONARY STENT INTERVENTION (PCI-S)  07/18/2011   Procedure: PERCUTANEOUS CORONARY STENT INTERVENTION (PCI-S);  Surgeon: Hillary Bow, MD;  Location: [facility] CATH LAB;  Service: Cardiovascular;;    Social History  reports that he has never smoked. He has never used smokeless tobacco. He reports that he does not drink alcohol and does not use drugs.  Allergies  Allergen Reactions  . Coreg [Carvedilol] Other (See Comments)    Redness and swelling of calf    Family History  Problem Relation Age of Onset  . Coronary artery disease Father        Developed in his 53s  . Kidney disease Father   . Diabetes Father   . Melanoma Father   . Rectal cancer Father   . Heart failure Mother   . Hypertension Mother   . Diabetes Sister   .  Diabetes Brother   . Colon cancer Neg Hx   . Esophageal cancer Neg Hx   . Stomach cancer Neg Hx   Reviewed on admission  Prior to Admission medications   Medication Sig Start Date End Date Taking? Authorizing Provider  albuterol (VENTOLIN HFA) 108 (90 Base) MCG/ACT inhaler Inhale 1-2 puffs into the lungs every 6 (six) hours as needed for wheezing or shortness of breath.  06/22/20   [provider]  amLODipine (NORVASC) 10 MG tablet Take 1 tablet (10 mg total) by mouth daily. 11/16/20   Ladell Pier, MD  ammonium lactate (AMLACTIN) 12 % lotion Apply to both feet twice daily for dry skin. 11/16/20   Ladell Pier, MD  aspirin EC 81 MG tablet Take 1 tablet (81 mg total) by mouth daily. 07/12/19   Argentina Donovan, PA-C  atorvastatin (LIPITOR) 40 MG tablet TAKE 1 TABLET BY MOUTH EVERY DAY 09/19/20   Ladell Pier, MD  blood glucose meter kit and supplies KIT Dispense based on patient and insurance preference. Use up to four times daily as directed. One Touch Verio 09/24/18   Ladell Pier, MD  brimonidine [facility]) 0.2 % ophthalmic solution Place 1 drop into the right eye 2 (two) times daily.  07/23/18   [provider]  Continuous Blood Gluc Receiver (DEXCOM G6 RECEIVER) DEVI 1 Device by Does not apply route daily. 07/11/20   Ladell Pier, MD  Continuous Blood Gluc Receiver (DEXCOM G6 RECEIVER) DEVI 1 Device by Does not apply route daily. 12/19/20   Ladell Pier, MD  Continuous Blood Gluc Receiver (FREESTYLE LIBRE 14 DAY READER) DEVI USE AS DIRECTED 12/19/20   Ladell Pier, MD  Continuous Blood Gluc Sensor (DEXCOM G6 SENSOR) MISC 1 packet by Does not apply route daily. 12/19/20   Ladell Pier, MD  Continuous Blood Gluc Sensor (FREESTYLE LIBRE SENSOR SYSTEM) MISC Change sensor Q 2 wks 12/19/20   Ladell Pier, MD  Continuous Blood Gluc Transmit (DEXCOM G6 TRANSMITTER) MISC 1 packet by Does not apply route daily. 12/19/20   Ladell Pier, MD   dorzolamide (TRUSOPT) 2 % ophthalmic solution Place 1 drop into the right eye 2 (two) times daily.    [provider]  furosemide (LASIX) 20 MG tablet Take 1 tablet (20 mg total) by mouth 2 (two) times daily. 12/19/20   Ladell Pier, MD  glucose blood (ACCU-CHEK AVIVA PLUS) test strip Use as instructed for 3 times daily testing of blood sugar. E11.9 10/20/17   Ladell Pier, MD  glucose monitoring kit (FREESTYLE) monitoring kit 1 each by Does not apply route as needed for other. 06/24/18   Ladell Pier, MD  hydrALAZINE (APRESOLINE) 50 MG tablet Take 1 tablet (50 mg total) by mouth 3 (three) times daily. 12/12/20   Charlott Rakes, MD  hydrocortisone (ANUSOL-HC) 2.5 % rectal cream Place 1 application rectally 2 (two) times daily as needed for hemorrhoids or anal itching (for flare  of hemorrhoids). 11/16/20   Ladell Pier, MD  insulin glargine (LANTUS SOLOSTAR) 100 UNIT/ML Solostar Pen Inject 38 Units into the skin at bedtime. 11/16/20   Ladell Pier, MD  insulin lispro (HUMALOG KWIKPEN) 100 UNIT/ML KwikPen Inject 10 Units into the skin 3 (three) times daily with meals. 07/09/20   Ladell Pier, MD  Insulin Pen Needle (BD PEN NEEDLE NANO U/F) 32G X 4 MM MISC USE AS DIRECTED 3 TIMES A DAY. Please schedule an appointment for additional refills. 07/11/20   Ladell Pier, MD  Lancets MISC Use as directed.  Accu chek 2 12/21/17   Ladell Pier, MD  methylPREDNISolone (MEDROL DOSEPAK) 4 MG TBPK tablet follow package directions 07/02/20   Thurnell Lose, MD  Multiple Vitamins-Minerals (MULTIVITAMIN WITH MINERALS) tablet Take 1 tablet by mouth daily.    [provider]  RHOPRESSA 0.02 % SOLN Place 1 drop into the right eye at bedtime. 01/24/20   [provider]  timolol (BETIMOL) 0.5 % ophthalmic solution Place 1 drop into the right eye 2 (two) times daily.    [provider]  VYZULTA 0.024 % SOLN Place 1 drop into the right eye daily.   09/19/19   [provider]    Physical Exam: Vitals:   12/24/20 1930 12/24/20 2045 12/24/20 2148 12/24/20 2200  BP: (!) 146/85 (!) 148/72  (!) 146/72  Pulse: 88 88  83  Resp: 13 11  15   Temp:      TempSrc:   Oral   SpO2: 93% 94%  93%  Weight:      Height:       Physical Exam Constitutional:      General: He is not in acute distress.    Appearance: Normal appearance.  HENT:     Head: Normocephalic and atraumatic.     Mouth/Throat:     Mouth: Mucous membranes are moist.     Pharynx: Oropharynx is clear.  Eyes:     Extraocular Movements: Extraocular movements intact.     Pupils: Pupils are equal, round, and reactive to light.  Cardiovascular:     Rate and Rhythm: Normal rate and regular rhythm.     Pulses: Normal pulses.     Heart sounds: Normal heart sounds.  Pulmonary:     Effort: Pulmonary effort is normal. No respiratory distress.     Breath sounds: Rales (Trace basilar) present.  Abdominal:     General: Bowel sounds are normal. There is no distension.     Palpations: Abdomen is soft.     Tenderness: There is no abdominal tenderness.  Musculoskeletal:        General: No swelling or deformity.     Right lower leg: Edema present.     Left lower leg: Edema present.  Skin:    General: Skin is warm and dry.     Comments: Significant dry skin changes bilateral lower extremities  Neurological:     General: No focal deficit present.     Mental Status: Mental status is at baseline.    Labs on Admission: I have personally reviewed following labs and imaging studies  CBC: Recent Labs  Lab 12/24/20 2010  WBC 7.4  NEUTROABS 5.5  HGB 8.5*  HCT 27.1*  MCV 86.0  PLT 400    Basic Metabolic Panel: Recent Labs  Lab 12/24/20 2010  NA 137  K 3.6  CL 104  CO2 24  GLUCOSE 174*  BUN 23  CREATININE 1.69*  CALCIUM  8.6*    GFR: Estimated Creatinine Clearance: 57.1 mL/min (A) (by C-G formula based on SCr of 1.69 mg/dL (H)).  Liver Function Tests: No  results for input(s): AST, ALT, ALKPHOS, BILITOT, PROT, ALBUMIN in the last 168 hours.  Urine analysis:    Component Value Date/Time   COLORURINE YELLOW 06/30/2020 1407   APPEARANCEUR HAZY (A) 06/30/2020 1407   LABSPEC 1.018 06/30/2020 1407   PHURINE 5.0 06/30/2020 1407   GLUCOSEU 150 (A) 06/30/2020 1407   HGBUR NEGATIVE 06/30/2020 1407   BILIRUBINUR NEGATIVE 06/30/2020 1407   BILIRUBINUR neg 12/13/2014 1447   KETONESUR NEGATIVE 06/30/2020 1407   PROTEINUR 100 (A) 06/30/2020 1407   UROBILINOGEN 1.0 12/13/2014 1447   UROBILINOGEN 1.0 05/05/2014 0630   NITRITE NEGATIVE 06/30/2020 1407   LEUKOCYTESUR NEGATIVE 06/30/2020 1407    Radiological Exams on Admission: DG Chest Port 1 View  Result Date: 12/24/2020 CLINICAL DATA:  Shortness of breath. EXAM: PORTABLE CHEST 1 VIEW COMPARISON:  June 29, 2020 FINDINGS: Diffusely increased interstitial lung markings are seen with moderate severity areas of scarring, atelectasis and/or infiltrate noted within the bilateral lower lobes. There is no evidence of a pleural effusion or pneumothorax. The heart size and mediastinal contours are within normal limits. The visualized skeletal structures are unremarkable. IMPRESSION: Increased interstitial lung markings with moderate severity areas of bilateral lower lobe scarring, atelectasis and/or infiltrate. Electronically Signed   By: Virgina Norfolk M.D.   On: 12/24/2020 19:37   EKG: Independently reviewed.  Sinus rhythm at 91 bpm.  First-degree AV block.  Right bundle branch block.  QTc 522.  Similar to previous, PR prolongation is new.  Assessment/Plan Principal Problem:   Acute exacerbation of CHF (congestive heart failure) (HCC) Active Problems:   Type 2 diabetes mellitus with complication, with long-term current use of insulin (HCC)   HLD (hyperlipidemia)   Coronary artery disease of native artery of native heart with stable angina pectoris ([place])   Essential hypertension   Glaucoma   Anemia    CKD (chronic kidney disease), stage III (HCC)  CHF > To be new diagnosis for him.  He states that he has been taking Lasix for lower extremity edema although he claims no diagnosis of heart failure. > Has had echoes associated with following up his CAD and his last echo was in 2019 showing EF 60-65% with some elevated end-diastolic pressures. > BNP elevated to 44 in the ED with chest x-ray showing increased interstitial markings.  Creatinine currently stable.  Received 40 mg IV Lasix in ED. - Monitor on telemetry - Continue with 40 mg IV Lasix twice daily - Daily weights, strict I's/O - Trend renal function and electrolytes  Anemia > Hemoglobin down to 8.5 from baseline around 10.5 in the ED. > FOBT has been ordered but results not back yet. - Follow-up FOBT - Hold off on pharmacologic DVT prophylaxis for now - Given MCV is low normal we will check iron studies  CAD Hyperlipidemia > DES in 2012 -Continue home aspirin and atorvastatin  Hypertension > BP in the 140s to 150s in the ED -Continue home hydralazine and amlodipine -We will likely need medication regimen adjustment based on likely new heart failure as above  Diabetes > On insulin with history of neuropathy and retinopathy > On 38 units long-acting and 10 units short acting with meals - Continue with 25 units long-acting, 5 units short acting with meals - SSI  CKD 3a > Creatinine stable at 1.16 in the ED. - Avoid nephrotoxic agents -  Trend renal function and electrolytes  Legal blindness  Glaucoma  - Continue home eyedrops, may need to clarify far records are up-to-date  DVT prophylaxis: SCDs, pending FOBT  Code Status:   Full  Family Communication:  None on admission  Disposition Plan:   Patient is from:  Home  Anticipated DC to:  Home  Anticipated DC date:  1 to 3 days  Anticipated DC barriers: None  Consults called:  None  Admission status:  Observation, telemetry  Severity of Illness: The appropriate  patient status for this patient is OBSERVATION. Observation status is judged to be reasonable and necessary in order to provide the required intensity of service to ensure the patient's safety. The patient's presenting symptoms, physical exam findings, and initial radiographic and laboratory data in the context of their medical condition is felt to place them at decreased risk for further clinical deterioration. Furthermore, it is anticipated that the patient will be medically stable for discharge from the hospital within 2 midnights of admission. The following factors support the patient status of observation.   " The patient's presenting symptoms include shortness of breath, edema. " The physical exam findings include edema, rales, dry skin. " The initial radiographic and laboratory data are chest x-ray with increased interstitial markings as well as moderate areas of bilateral lower lobe scarring versus fibrosis versus infiltrate.  Creatinine 1.6 which is stable.  Hemoglobin 8.5 which is down from previous of 10.5.  BNP elevated to 4 4.   Marcelyn Bruins MD [facility]  How to contact the [facility] Attending or Consulting provider [place] or covering provider during after hours [place], for this patient?   1. Check the care team in [facility] and look for a) attending/consulting [facility] provider listed and b) the [facility] team listed 2. Log into [email or web address] and use [facility]'s universal password to access. If you do not have the password, please contact the hospital operator. 3. Locate the [facility] provider you are looking for under [facility] and page to a number that you can be directly reached. 4. If you still have difficulty reaching the provider, please page the [facility] (Director on Call) for the Hospitalists listed on amion for assistance.  12/24/2020, 10:39 PM

## 2020-12-24 NOTE — ED Notes (Signed)
Pt ambulated to and from the restroom using cane.

## 2020-12-24 NOTE — ED Provider Notes (Signed)
[facility]ERG[facility] DEPARTMENT Provider Note   CSN: [id number] Arrival date & time: 12/24/20  1804     History No chief complaint on file.   Derek Lowery is a 65 y.o. male.  HPI    65 year old with history of CAD, diabetes comes in a chief complaint of shortness of breath.  He denies history of CHF, but is on Lasix.  Patient reports that his been having progressive swelling of his legs over the last month or so.  His PCP saw him end of last month and increase his Lasix to 20 twice daily, but his symptoms have progressed.  He is also now having shortness of breath with exertion, therefore he was advised to come to the ER.   Past Medical History:  Diagnosis Date  . Arthritis   . CAD (coronary artery disease)    NSTEMI 06/2011:  LHC 07/18/11: Proximal diagonal 60%, distal LAD with a diabetic appearance and 60% stenosis, OM2 with an occluded superior branch and an inferior branch with 90%, EF 55% with inferior hypokinesis.  PCI: Promus DES to the OM2 inferior branch.  This vessel provides collaterals to the superior branch which remained occluded.  Echocardiogram 07/18/11: EF 60%, normal wall motion.  . Cataract   . Essential hypertension, benign   . Glaucoma   . Hypercholesteremia   . Internal hemorrhoids   . Noncompliance   . Type 2 diabetes mellitus ([place]) 1990    Patient Active Problem List   Diagnosis Date Noted  . Pneumatouria 06/28/2020  . Acute respiratory failure with hypoxia ([place]) 06/28/2020  . Pneumonia due to COVID-19 virus 06/28/2020  . Proliferative diabetic retinopathy of right eye with macular edema associated with type 2 diabetes mellitus ([place]) 11/30/2019  . Rubeosis iridis of right eye 11/30/2019  . Diabetic macular edema of right eye with proliferative retinopathy associated with type 2 diabetes mellitus ([place]) 11/30/2019  . Glaucoma with pupillary seclusion, unspecified laterality, severe stage 11/30/2019  . Trigger middle finger of  right hand 10/17/2019  . Pain due to onychomycosis of toenails of both feet 08/07/2019  . Educated about COVID-19 virus infection 12/02/2018  . RBBB 12/02/2018  . Benign paroxysmal positional vertigo 06/25/2018  . Pre-ulcerative corn or callous 10/13/2017  . Anemia 10/13/2017  . Chronic diarrhea 06/09/2017  . Microalbuminuria 03/07/2017  . Dry skin 03/05/2017  . Glaucoma 03/05/2017  . Legally blind 03/05/2017  . Retinopathy due to secondary diabetes ([place]) 02/06/2016  . Diabetic polyneuropathy associated with type 2 diabetes mellitus ([place]) 01/12/2015  . Fournier's gangrene into true pelvis s/p I&D 05/05/2014 05/05/2014  . Type 2 diabetes mellitus with complication, with long-term current use of insulin ([place]) 07/17/2011  . HLD (hyperlipidemia) 07/17/2011  . Coronary artery disease of native artery of native heart with stable angina pectoris ([place]) 07/17/2011  . Essential hypertension 07/17/2011    Past Surgical History:  Procedure Laterality Date  . CATARACT EXTRACTION Right 2019   Dr. Katy Fitch  . CHOLECYSTECTOMY N/A 09/21/2017   Procedure: LAPAROSCOPIC CHOLECYSTECTOMY WITH INTRAOPERATIVE CHOLANGIOGRAM;  Surgeon: Michael Boston, MD;  Location: [facility] ORS;  Service: General;  Laterality: N/A;  . COLONOSCOPY  2000   Dr. Collene Mares  . EYE SURGERY    . IRRIGATION AND DEBRIDEMENT ABSCESS Left 05/05/2014   Procedure: IRRIGATION AND DEBRIDEMENT ABSCESS left buttock;  Surgeon: Michael Boston, MD;  Location: [facility];  Service: General;  Laterality: Left;  . LEFT HEART CATHETERIZATION WITH CORONARY ANGIOGRAM N/A 07/18/2011   Procedure: LEFT HEART CATHETERIZATION WITH  CORONARY ANGIOGRAM;  Surgeon: Hillary Bow, MD;  Location: [facility] C[facility] LAB;  Service: Cardiovascular;  Laterality: N/A;  . PERCUTANEOUS CORONARY STENT INTERVENTION (PCI-S)  07/18/2011   Procedure: PERCUTANEOUS CORONARY STENT INTERVENTION (PCI-S);  Surgeon: Hillary Bow, MD;  Location: [facility] CATH LAB;  Service: Cardiovascular;;       Family  History  Problem Relation Age of Onset  . Coronary artery disease Father        Developed in his 60s  . Kidney disease Father   . Diabetes Father   . Melanoma Father   . Rectal cancer Father   . Heart failure Mother   . Hypertension Mother   . Diabetes Sister   . Diabetes Brother   . Colon cancer Neg Hx   . Esophageal cancer Neg Hx   . Stomach cancer Neg Hx     Social History   Tobacco Use  . Smoking status: Never Smoker  . Smokeless tobacco: Never Used  Vaping Use  . Vaping Use: Never used  Substance Use Topics  . Alcohol use: No  . Drug use: No    Home Medications Prior to Admission medications   Medication Sig Start Date End Date Taking? Authorizing Provider  albuterol (VENTOLIN HFA) 108 (90 Base) MCG/ACT inhaler Inhale 1-2 puffs into the lungs every 6 (six) hours as needed for wheezing or shortness of breath.  06/22/20   [provider]  amLODipine (NORVASC) 10 MG tablet Take 1 tablet (10 mg total) by mouth daily. 11/16/20   Ladell Pier, MD  ammonium lactate (AMLACTIN) 12 % lotion Apply to both feet twice daily for dry skin. 11/16/20   Ladell Pier, MD  aspirin EC 81 MG tablet Take 1 tablet (81 mg total) by mouth daily. 07/12/19   Argentina Donovan, PA-C  atorvastatin (LIPITOR) 40 MG tablet TAKE 1 TABLET BY MOUTH EVERY DAY 09/19/20   Ladell Pier, MD  blood glucose meter kit and supplies KIT Dispense based on patient and insurance preference. Use up to four times daily as directed. One Touch Verio 09/24/18   Ladell Pier, MD  brimonidine [facility]) 0.2 % ophthalmic solution Place 1 drop into the right eye 2 (two) times daily.  07/23/18   [provider]  Continuous Blood Gluc Receiver (DEXCOM G6 RECEIVER) DEVI 1 Device by Does not apply route daily. 07/11/20   Ladell Pier, MD  Continuous Blood Gluc Receiver (DEXCOM G6 RECEIVER) DEVI 1 Device by Does not apply route daily. 12/19/20   Ladell Pier, MD  Continuous Blood Gluc  Receiver (FREESTYLE LIBRE 14 DAY READER) DEVI USE AS DIRECTED 12/19/20   Ladell Pier, MD  Continuous Blood Gluc Sensor (DEXCOM G6 SENSOR) MISC 1 packet by Does not apply route daily. 12/19/20   Ladell Pier, MD  Continuous Blood Gluc Sensor (FREESTYLE LIBRE SENSOR SYSTEM) MISC Change sensor Q 2 wks 12/19/20   Ladell Pier, MD  Continuous Blood Gluc Transmit (DEXCOM G6 TRANSMITTER) MISC 1 packet by Does not apply route daily. 12/19/20   Ladell Pier, MD  dorzolamide (TRUSOPT) 2 % ophthalmic solution Place 1 drop into the right eye 2 (two) times daily.    [provider]  furosemide (LASIX) 20 MG tablet Take 1 tablet (20 mg total) by mouth 2 (two) times daily. 12/19/20   Ladell Pier, MD  glucose blood (ACCU-CHEK AVIVA PLUS) test strip Use as instructed for 3 times daily testing of blood sugar. E11.9 10/20/17  Ladell Pier, MD  glucose monitoring kit (FREESTYLE) monitoring kit 1 each by Does not apply route as needed for other. 06/24/18   Ladell Pier, MD  hydrALAZINE (APRESOLINE) 50 MG tablet Take 1 tablet (50 mg total) by mouth 3 (three) times daily. 12/12/20   Charlott Rakes, MD  hydrocortisone (ANUSOL-HC) 2.5 % rectal cream Place 1 application rectally 2 (two) times daily as needed for hemorrhoids or anal itching (for flare of hemorrhoids). 11/16/20   Ladell Pier, MD  insulin glargine (LANTUS SOLOSTAR) 100 UNIT/ML Solostar Pen Inject 38 Units into the skin at bedtime. 11/16/20   Ladell Pier, MD  insulin lispro (HUMALOG KWIKPEN) 100 UNIT/ML KwikPen Inject 10 Units into the skin 3 (three) times daily with meals. 07/09/20   Ladell Pier, MD  Insulin Pen Needle (BD PEN NEEDLE NANO U/F) 32G X 4 MM MISC USE AS DIRECTED 3 TIMES A DAY. Please schedule an appointment for additional refills. 07/11/20   Ladell Pier, MD  Lancets MISC Use as directed.  Accu chek 2 12/21/17   Ladell Pier, MD  methylPREDNISolone (MEDROL DOSEPAK) 4 MG TBPK tablet  follow package directions 07/02/20   Thurnell Lose, MD  Multiple Vitamins-Minerals (MULTIVITAMIN WITH MINERALS) tablet Take 1 tablet by mouth daily.    [provider]  RHOPRESSA 0.02 % SOLN Place 1 drop into the right eye at bedtime. 01/24/20   [provider]  timolol (BETIMOL) 0.5 % ophthalmic solution Place 1 drop into the right eye 2 (two) times daily.    [provider]  VYZULTA 0.024 % SOLN Place 1 drop into the right eye daily.  09/19/19   [provider]    Allergies    Coreg [carvedilol]  Review of Systems   Review of Systems  Constitutional: Positive for activity change.  Respiratory: Positive for shortness of breath.   Cardiovascular: Negative for chest pain.  Gastrointestinal: Negative for nausea and vomiting.  All other systems reviewed and are negative.   Physical Exam Updated Vital Signs BP (!) 148/72   Pulse 88   Temp 98.2 F (36.8 C) (Oral)   Resp 11   Ht 5' 10" (1.778 m)   Wt 112.9 kg   SpO2 94%   BMI 35.73 kg/m   Physical Exam Vitals and nursing note reviewed.  Constitutional:      Appearance: He is well-developed.  HENT:     Head: Atraumatic.  Neck:     Vascular: No carotid bruit.  Cardiovascular:     Rate and Rhythm: Normal rate.     Heart sounds: No gallop.   Pulmonary:     Effort: Pulmonary effort is normal.  Musculoskeletal:     Cervical back: Neck supple.     Right lower leg: Edema present.     Left lower leg: Edema present.     Comments: 3+ bilateral lower extremity pitting edema  Skin:    General: Skin is warm.  Neurological:     Mental Status: He is alert and oriented to person, place, and time.     ED Results / Procedures / Treatments   Labs (all labs ordered are listed, but only abnormal results are displayed) Labs Reviewed  CBC WITH DIFFERENTIAL/PLATELET - Abnormal; Notable for the following components:      Result Value   RBC 3.15 (*)    Hemoglobin 8.5 (*)    HCT 27.1 (*)    All  other components within normal limits  BRAIN NATRIURETIC  PEPTIDE - Abnormal; Notable for the following components:   B Natriuretic Peptide 404.4 (*)    All other components within normal limits  BASIC METABOLIC PANEL - Abnormal; Notable for the following components:   Glucose, Bld 174 (*)    Creatinine, Ser 1.69 (*)    Calcium 8.6 (*)    GFR, Estimated 45 (*)    All other components within normal limits  SARS CORONAVIRUS 2 (TAT 6-24 HRS)    EKG EKG Interpretation  Date/Time:  Monday Dec 24 2020 18:11:59 EDT Ventricular Rate:  91 PR Interval:  227 QRS Duration: 121 QT Interval:  424 QTC Calculation: 522 R Axis:   222 Text Interpretation: Sinus rhythm Prolonged PR interval Right bundle branch block No acute changes No significant change since last tracing Confirmed by Varney Biles ([id number]) on 12/24/2020 8:19:49 PM   Radiology DG Chest Port 1 View  Result Date: 12/24/2020 CLINICAL DATA:  Shortness of breath. EXAM: PORTABLE CHEST 1 VIEW COMPARISON:  June 29, 2020 FINDINGS: Diffusely increased interstitial lung markings are seen with moderate severity areas of scarring, atelectasis and/or infiltrate noted within the bilateral lower lobes. There is no evidence of a pleural effusion or pneumothorax. The heart size and mediastinal contours are within normal limits. The visualized skeletal structures are unremarkable. IMPRESSION: Increased interstitial lung markings with moderate severity areas of bilateral lower lobe scarring, atelectasis and/or infiltrate. Electronically Signed   By: Virgina Norfolk M.D.   On: 12/24/2020 19:37    Procedures Procedures   Medications Ordered in ED Medications  furosemide (LASIX) injection 40 mg (has no administration in time range)    ED Course  I have reviewed the triage vital signs and the nursing notes.  Pertinent labs & imaging results that were available during my care of the patient were reviewed by me and considered in my medical  decision making (see chart for details).    MDM Rules/Calculators/A&P                          65 year old male comes in a chief complaint of shortness of breath.  He is also complaining of worsening lower extremity pitting edema.  He does not have history of CHF, but is on Lasix.  Apparently his Lasix was increased recently and he still has not improved.  The shortness of breath and exertion has been present over the last 2 weeks  Patient has known history of CAD and I have reviewed the echo from 2019.  From the information gathered at, I am speculating that patient does not have CHF, but the Lasix was started because of his pitting edema -and he is not responding to it in the outpatient setting.  CHF work-up initiated.   Reassessment: Patient is noted to have elevated BNP and x-rays are concerning for possible interstitial edema.  We will admit him to the hospital for what appears to be new diagnosis of CHF.  Final Clinical Impression(s) / ED Diagnoses Final diagnoses:  Acute systolic congestive heart failure [facility]    Rx / DC Orders ED Discharge Orders    None       Varney Biles, MD 12/24/20 2203

## 2020-12-25 ENCOUNTER — Inpatient Hospital Stay (HOSPITAL_COMMUNITY): Payer: Medicare Other

## 2020-12-25 DIAGNOSIS — Z9119 Patient's noncompliance with other medical treatment and regimen: Secondary | ICD-10-CM | POA: Diagnosis not present

## 2020-12-25 DIAGNOSIS — I5043 Acute on chronic combined systolic (congestive) and diastolic (congestive) heart failure: Secondary | ICD-10-CM | POA: Diagnosis present

## 2020-12-25 DIAGNOSIS — R0609 Other forms of dyspnea: Secondary | ICD-10-CM

## 2020-12-25 DIAGNOSIS — I509 Heart failure, unspecified: Secondary | ICD-10-CM | POA: Diagnosis not present

## 2020-12-25 DIAGNOSIS — E1142 Type 2 diabetes mellitus with diabetic polyneuropathy: Secondary | ICD-10-CM | POA: Diagnosis present

## 2020-12-25 DIAGNOSIS — E1122 Type 2 diabetes mellitus with diabetic chronic kidney disease: Secondary | ICD-10-CM | POA: Diagnosis present

## 2020-12-25 DIAGNOSIS — E113511 Type 2 diabetes mellitus with proliferative diabetic retinopathy with macular edema, right eye: Secondary | ICD-10-CM | POA: Diagnosis present

## 2020-12-25 DIAGNOSIS — I5021 Acute systolic (congestive) heart failure: Secondary | ICD-10-CM | POA: Diagnosis not present

## 2020-12-25 DIAGNOSIS — H548 Legal blindness, as defined in USA: Secondary | ICD-10-CM | POA: Diagnosis present

## 2020-12-25 DIAGNOSIS — K5900 Constipation, unspecified: Secondary | ICD-10-CM | POA: Diagnosis present

## 2020-12-25 DIAGNOSIS — E785 Hyperlipidemia, unspecified: Secondary | ICD-10-CM | POA: Diagnosis present

## 2020-12-25 DIAGNOSIS — I5033 Acute on chronic diastolic (congestive) heart failure: Secondary | ICD-10-CM | POA: Diagnosis not present

## 2020-12-25 DIAGNOSIS — D649 Anemia, unspecified: Secondary | ICD-10-CM | POA: Diagnosis not present

## 2020-12-25 DIAGNOSIS — I252 Old myocardial infarction: Secondary | ICD-10-CM | POA: Diagnosis not present

## 2020-12-25 DIAGNOSIS — D509 Iron deficiency anemia, unspecified: Secondary | ICD-10-CM | POA: Diagnosis present

## 2020-12-25 DIAGNOSIS — M1990 Unspecified osteoarthritis, unspecified site: Secondary | ICD-10-CM | POA: Diagnosis present

## 2020-12-25 DIAGNOSIS — D638 Anemia in other chronic diseases classified elsewhere: Secondary | ICD-10-CM | POA: Diagnosis present

## 2020-12-25 DIAGNOSIS — Z8616 Personal history of COVID-19: Secondary | ICD-10-CM | POA: Diagnosis not present

## 2020-12-25 DIAGNOSIS — H409 Unspecified glaucoma: Secondary | ICD-10-CM | POA: Diagnosis present

## 2020-12-25 DIAGNOSIS — I25118 Atherosclerotic heart disease of native coronary artery with other forms of angina pectoris: Secondary | ICD-10-CM | POA: Diagnosis not present

## 2020-12-25 DIAGNOSIS — I130 Hypertensive heart and chronic kidney disease with heart failure and stage 1 through stage 4 chronic kidney disease, or unspecified chronic kidney disease: Secondary | ICD-10-CM | POA: Diagnosis present

## 2020-12-25 DIAGNOSIS — N179 Acute kidney failure, unspecified: Secondary | ICD-10-CM | POA: Diagnosis not present

## 2020-12-25 DIAGNOSIS — Z20822 Contact with and (suspected) exposure to covid-19: Secondary | ICD-10-CM | POA: Diagnosis present

## 2020-12-25 DIAGNOSIS — Z955 Presence of coronary angioplasty implant and graft: Secondary | ICD-10-CM | POA: Diagnosis not present

## 2020-12-25 DIAGNOSIS — I2510 Atherosclerotic heart disease of native coronary artery without angina pectoris: Secondary | ICD-10-CM | POA: Diagnosis present

## 2020-12-25 DIAGNOSIS — N1831 Chronic kidney disease, stage 3a: Secondary | ICD-10-CM | POA: Diagnosis not present

## 2020-12-25 LAB — CBC
HCT: 27.7 % — ABNORMAL LOW (ref 39.0–52.0)
Hemoglobin: 8.8 g/dL — ABNORMAL LOW (ref 13.0–17.0)
MCH: 26.8 pg (ref 26.0–34.0)
MCHC: 31.8 g/dL (ref 30.0–36.0)
MCV: 84.5 fL (ref 80.0–100.0)
Platelets: 178 10*3/uL (ref 150–400)
RBC: 3.28 MIL/uL — ABNORMAL LOW (ref 4.22–5.81)
RDW: 14.2 % (ref 11.5–15.5)
WBC: 7.3 10*3/uL (ref 4.0–10.5)
nRBC: 0 % (ref 0.0–0.2)

## 2020-12-25 LAB — ECHOCARDIOGRAM COMPLETE
AR max vel: 1.96 cm2
AV Area VTI: 1.82 cm2
AV Area mean vel: 1.83 cm2
AV Mean grad: 5 mmHg
AV Peak grad: 8.8 mmHg
Ao pk vel: 1.48 m/s
Area-P 1/2: 5.16 cm2
Height: 70 in
MV VTI: 1.69 cm2
S' Lateral: 3.3 cm
Weight: 4264 oz

## 2020-12-25 LAB — COMPREHENSIVE METABOLIC PANEL
ALT: 13 U/L (ref 0–44)
AST: 19 U/L (ref 15–41)
Albumin: 3.1 g/dL — ABNORMAL LOW (ref 3.5–5.0)
Alkaline Phosphatase: 74 U/L (ref 38–126)
Anion gap: 9 (ref 5–15)
BUN: 21 mg/dL (ref 8–23)
CO2: 26 mmol/L (ref 22–32)
Calcium: 8.6 mg/dL — ABNORMAL LOW (ref 8.9–10.3)
Chloride: 103 mmol/L (ref 98–111)
Creatinine, Ser: 1.72 mg/dL — ABNORMAL HIGH (ref 0.61–1.24)
GFR, Estimated: 44 mL/min — ABNORMAL LOW (ref >60)
Glucose, Bld: 165 mg/dL — ABNORMAL HIGH (ref 70–99)
Potassium: 3.6 mmol/L (ref 3.5–5.1)
Sodium: 138 mmol/L (ref 135–145)
Total Bilirubin: 0.6 mg/dL (ref 0.3–1.2)
Total Protein: 6.1 g/dL — ABNORMAL LOW (ref 6.5–8.1)

## 2020-12-25 LAB — IRON AND TIBC
Iron: 28 ug/dL — ABNORMAL LOW (ref 45–182)
Saturation Ratios: 12 % — ABNORMAL LOW (ref 17.9–39.5)
TIBC: 235 ug/dL — ABNORMAL LOW (ref 250–450)
UIBC: 207 ug/dL

## 2020-12-25 LAB — FERRITIN: Ferritin: 133 ng/mL (ref 24–336)

## 2020-12-25 LAB — SARS CORONAVIRUS 2 (TAT 6-24 HRS): SARS Coronavirus 2: NEGATIVE

## 2020-12-25 LAB — GLUCOSE, CAPILLARY
Glucose-Capillary: 102 mg/dL — ABNORMAL HIGH (ref 70–99)
Glucose-Capillary: 110 mg/dL — ABNORMAL HIGH (ref 70–99)
Glucose-Capillary: 166 mg/dL — ABNORMAL HIGH (ref 70–99)
Glucose-Capillary: 174 mg/dL — ABNORMAL HIGH (ref 70–99)
Glucose-Capillary: 94 mg/dL (ref 70–99)

## 2020-12-25 LAB — OCCULT BLOOD X 1 CARD TO LAB, STOOL: Fecal Occult Bld: NEGATIVE

## 2020-12-25 LAB — MAGNESIUM: Magnesium: 2 mg/dL (ref 1.7–2.4)

## 2020-12-25 MED ORDER — BRIMONIDINE TARTRATE 0.2 % OP SOLN
1.0000 [drp] | Freq: Two times a day (BID) | OPHTHALMIC | Status: DC
  Administered 2020-12-25 – 2020-12-30 (×10): 1 [drp] via OPHTHALMIC
  Filled 2020-12-25: qty 5

## 2020-12-25 MED ORDER — MELATONIN 5 MG PO TABS
5.0000 mg | ORAL_TABLET | Freq: Every day | ORAL | Status: DC
  Administered 2020-12-25 – 2020-12-29 (×5): 5 mg via ORAL
  Filled 2020-12-25 (×5): qty 1

## 2020-12-25 MED ORDER — TIMOLOL MALEATE 0.5 % OP SOLN
1.0000 [drp] | Freq: Two times a day (BID) | OPHTHALMIC | Status: DC
  Administered 2020-12-25 – 2020-12-30 (×10): 1 [drp] via OPHTHALMIC
  Filled 2020-12-25: qty 5

## 2020-12-25 MED ORDER — AMLODIPINE BESYLATE 5 MG PO TABS: 5.0000 mg | ORAL_TABLET | Freq: Every day | ORAL | Status: DC

## 2020-12-25 MED ORDER — HEPARIN SODIUM (PORCINE) 5000 UNIT/ML IJ SOLN
5000.0000 [IU] | Freq: Three times a day (TID) | INTRAMUSCULAR | Status: DC
  Administered 2020-12-25 – 2020-12-30 (×15): 5000 [IU] via SUBCUTANEOUS
  Filled 2020-12-25 (×15): qty 1

## 2020-12-25 NOTE — Progress Notes (Signed)
  Echocardiogram 2D Echocardiogram has been performed.  Derek Lowery  [name] 12/25/2020, 1:43 PM

## 2020-12-25 NOTE — Progress Notes (Signed)
Heart Failure Nurse Navigator Progress Note  Navigation team following this hospitalization. Screening pending ECHO.   Pricilla Holm, RN, BSN Heart Failure Nurse Navigator [phone]

## 2020-12-25 NOTE — Progress Notes (Signed)
Progress Note    Derek Lowery  HAL:[id number] DOB: 13-Nov-1955  DOA: 12/24/2020 PCP: Ladell Pier, MD    Brief Narrative:     Medical records reviewed and are as summarized below:  Derek Lowery is an 65 y.o. male with medical history significant of anemia, BP PV, chronic diarrhea, CAD, diabetes, hypertension, hyperlipidemia, glaucoma, legal blindness, CKD 3A, hemorrhoids who presents with worsening edema and shortness of breath. Slowly responding to IV lasix.   Assessment/Plan:   Principal Problem:   Acute exacerbation of CHF (congestive heart failure) (HCC) Active Problems:   Type 2 diabetes mellitus with complication, with long-term current use of insulin (HCC)   HLD (hyperlipidemia)   Coronary artery disease of native artery of native heart with stable angina pectoris (HCC)   Essential hypertension   Glaucoma   Anemia   CKD (chronic kidney disease), stage III (HCC)   CHF -Has had echoes associated with following up his CAD and his last echo was in 2019 showing EF 60-65% with some elevated end-diastolic pressures. -BNP elevated to 44 in the ED with chest x-ray showing increased interstitial markings.  Creatinine currently stable.  Received 40 mg IV Lasix in ED. - Continue with 40 mg IV Lasix twice daily - Daily weights, strict I's/O - Trend renal function and electrolytes -echo pending  Anemia -Hemoglobin down to 8.5 from baseline around 10.5 in the ED. - FOBT has been ordered but results not back yet. - iron low-- upon d/c would prescribe PO iron  CAD Hyperlipidemia -DES in 2012 -Continue home aspirin and atorvastatin  Hypertension -Continue home hydralazine  -decrease norvasc for LE Edema -getting IV lasix  Diabetes -On insulin with history of neuropathy and retinopathy - Continue with 25 units long-acting, 5 units short acting with meals - SSI  CKD 3a -baseline 1.6-1.8 - Trend renal function and electrolytes  Legal blindness   Glaucoma  - Continue home eyedrops, may need to clarify far records are up-to-date  obesity Body mass index is 38.24 kg/m.   Family Communication/Anticipated D/C date and plan/Code Status   DVT prophylaxis: heparin Code Status: Full Code.  Disposition Plan: Status is: Inpatient  Remains inpatient appropriate because:Inpatient level of care appropriate due to severity of illness   Dispo: The patient is from: Home              Anticipated d/c is to: Home              Patient currently is not medically stable to d/c.   Difficult to place patient No         Medical Consultants:    None.   Subjective:   Still with LE edema  Objective:    Vitals:   12/24/20 2200 12/24/20 2336 12/25/20 0349 12/25/20 0810  BP: (!) 146/72 (!) 151/78 (!) 147/75 (!) 151/71  Pulse: 83  84 87  Resp: 15 17 16 17   Temp:  98.3 F (36.8 C) 98 F (36.7 C) 98 F (36.7 C)  TempSrc:  Oral Oral Oral  SpO2: 93% 96% 96% 97%  Weight:   120.9 kg   Height:   5' 10"  (1.778 m)     Intake/Output Summary (Last 24 hours) at 12/25/2020 1158 Last data filed at 12/25/2020 1133 Gross per 24 hour  Intake 900 ml  Output 800 ml  Net 100 ml   Filed Weights   12/24/20 1811 12/25/20 0349  Weight: 112.9 kg 120.9 kg    Exam:  General:  Appearance:    Obese male in no acute distress     Lungs:     Not on O2, crackles at bases, respirations unlabored  Heart:    Normal heart rate.   MS:   All extremities are intact. Chronic changes of skin b/l, + LE edema  Neurologic:   Awake, alert, oriented x 3. No apparent focal neurological           defect.      Data Reviewed:   I have personally reviewed following labs and imaging studies:  Labs: Labs show the following:   Basic Metabolic Panel: Recent Labs  Lab 12/24/20 2010 12/25/20 0014  NA 137 138  K 3.6 3.6  CL 104 103  CO2 24 26  GLUCOSE 174* 165*  BUN 23 21  CREATININE 1.69* 1.72*  CALCIUM 8.6* 8.6*  MG  --  2.0   GFR Estimated  Creatinine Clearance: 58.1 mL/min (A) (by C-G formula based on SCr of 1.72 mg/dL (H)). Liver Function Tests: Recent Labs  Lab 12/25/20 0014  AST 19  ALT 13  ALKPHOS 74  BILITOT 0.6  PROT 6.1*  ALBUMIN 3.1*   No results for input(s): LIPASE, AMYLASE in the last 168 hours. No results for input(s): AMMONIA in the last 168 hours. Coagulation profile No results for input(s): INR, PROTIME in the last 168 hours.  CBC: Recent Labs  Lab 12/24/20 2010 12/25/20 0014  WBC 7.4 7.3  NEUTROABS 5.5  --   HGB 8.5* 8.8*  HCT 27.1* 27.7*  MCV 86.0 84.5  PLT 167 178   Cardiac Enzymes: No results for input(s): CKTOTAL, CKMB, CKMBINDEX, TROPONINI in the last 168 hours. BNP (last 3 results) No results for input(s): PROBNP in the last 8760 hours. CBG: Recent Labs  Lab 12/25/20 0026 12/25/20 0554 12/25/20 1055  GLUCAP 174* 166* 94   D-Dimer: No results for input(s): DDIMER in the last 72 hours. Hgb A1c: No results for input(s): HGBA1C in the last 72 hours. Lipid Profile: No results for input(s): CHOL, HDL, LDLCALC, TRIG, CHOLHDL, LDLDIRECT in the last 72 hours. Thyroid function studies: No results for input(s): TSH, T4TOTAL, T3FREE, THYROIDAB in the last 72 hours.  Invalid input(s): FREET3 Anemia work up: Recent Labs    12/25/20 0014  FERRITIN 133  TIBC 235*  IRON 28*   Sepsis Labs: Recent Labs  Lab 12/24/20 2010 12/25/20 0014  WBC 7.4 7.3    Microbiology Recent Results (from the past 240 hour(s))  SARS CORONAVIRUS 2 (TAT 6-24 HRS) Nasopharyngeal Nasopharyngeal Swab     Status: None   Collection Time: 12/24/20  8:13 PM   Specimen: Nasopharyngeal Swab  Result Value Ref Range Status   SARS Coronavirus 2 NEGATIVE NEGATIVE Final    Comment: (NOTE) SARS-CoV-2 target nucleic acids are NOT DETECTED.  The SARS-CoV-2 RNA is generally detectable in upper and lower respiratory specimens during the acute phase of infection. Negative results do not preclude SARS-CoV-2  infection, do not rule out co-infections with other pathogens, and should not be used as the sole basis for treatment or other patient management decisions. Negative results must be combined with clinical observations, patient history, and epidemiological information. The expected result is Negative.  Fact Sheet for Patients: SugarRoll.be  Fact Sheet for Healthcare Providers: https://www.woods-mathews.com/  This test is not yet approved or cleared by the [place] FDA and  has been authorized for detection and/or diagnosis of SARS-CoV-2 by FDA under an Emergency Use Authorization (EUA). This EUA will remain  in effect (meaning this test can be used) for the duration of the COVID-19 declaration under Se ction 564(b)(1) of the Act, 21 U.S.C. section 360bbb-3(b)(1), unless the authorization is terminated or revoked sooner.  Performed at [facility], [place] [address], [place], [place] [address]     Procedures and diagnostic studies:  DG Chest Port 1 View  Result Date: 12/24/2020 CLINICAL DATA:  Shortness of breath. EXAM: PORTABLE CHEST 1 VIEW COMPARISON:  June 29, 2020 FINDINGS: Diffusely increased interstitial lung markings are seen with moderate severity areas of scarring, atelectasis and/or infiltrate noted within the bilateral lower lobes. There is no evidence of a pleural effusion or pneumothorax. The heart size and mediastinal contours are within normal limits. The visualized skeletal structures are unremarkable. IMPRESSION: Increased interstitial lung markings with moderate severity areas of bilateral lower lobe scarring, atelectasis and/or infiltrate. Electronically Signed   By: Virgina Norfolk M.D.   On: 12/24/2020 19:37    Medications:   . amLODipine  10 mg Oral Daily  . aspirin EC  81 mg Oral Daily  . atorvastatin  40 mg Oral Daily  . dorzolamide  1 drop Right Eye BID  . furosemide  40 mg Intravenous BID  .  hydrALAZINE  50 mg Oral Q8H  . insulin aspart  0-15 Units Subcutaneous TID WC  . insulin aspart  0-5 Units Subcutaneous QHS  . insulin aspart  5 Units Subcutaneous TID WC  . insulin glargine  25 Units Subcutaneous QHS  . latanoprost  1 drop Right Eye QHS  . Netarsudil Dimesylate  1 drop Right Eye QHS  . sodium chloride flush  3 mL Intravenous Q12H   Continuous Infusions:   LOS: 0 days   Geradine Girt  [facility]   How to contact the [facility] Attending or Consulting provider [place] or covering provider during after hours [place], for this patient?  1. Check the care team in [facility] and look for a) attending/consulting TRH provider listed and b) the [facility] team listed 2. Log into [email or web address] and use [place]'s universal password to access. If you do not have the password, please contact the hospital operator. 3. Locate the [facility] provider you are looking for under [facility] and page to a number that you can be directly reached. 4. If you still have difficulty reaching the provider, please page the [facility] (Director on Call) for the Hospitalists listed on amion for assistance.  12/25/2020, 11:58 AM

## 2020-12-26 DIAGNOSIS — I5033 Acute on chronic diastolic (congestive) heart failure: Secondary | ICD-10-CM

## 2020-12-26 LAB — CBC
HCT: 25 % — ABNORMAL LOW (ref 39.0–52.0)
Hemoglobin: 7.9 g/dL — ABNORMAL LOW (ref 13.0–17.0)
MCH: 27.2 pg (ref 26.0–34.0)
MCHC: 31.6 g/dL (ref 30.0–36.0)
MCV: 86.2 fL (ref 80.0–100.0)
Platelets: 148 10*3/uL — ABNORMAL LOW (ref 150–400)
RBC: 2.9 MIL/uL — ABNORMAL LOW (ref 4.22–5.81)
RDW: 14.3 % (ref 11.5–15.5)
WBC: 5.4 10*3/uL (ref 4.0–10.5)
nRBC: 0 % (ref 0.0–0.2)

## 2020-12-26 LAB — BASIC METABOLIC PANEL
Anion gap: 9 (ref 5–15)
BUN: 25 mg/dL — ABNORMAL HIGH (ref 8–23)
CO2: 28 mmol/L (ref 22–32)
Calcium: 8.6 mg/dL — ABNORMAL LOW (ref 8.9–10.3)
Chloride: 103 mmol/L (ref 98–111)
Creatinine, Ser: 1.69 mg/dL — ABNORMAL HIGH (ref 0.61–1.24)
GFR, Estimated: 45 mL/min — ABNORMAL LOW (ref >60)
Glucose, Bld: 118 mg/dL — ABNORMAL HIGH (ref 70–99)
Potassium: 3.7 mmol/L (ref 3.5–5.1)
Sodium: 140 mmol/L (ref 135–145)

## 2020-12-26 LAB — GLUCOSE, CAPILLARY
Glucose-Capillary: 114 mg/dL — ABNORMAL HIGH (ref 70–99)
Glucose-Capillary: 88 mg/dL (ref 70–99)
Glucose-Capillary: 95 mg/dL (ref 70–99)
Glucose-Capillary: 159 mg/dL — ABNORMAL HIGH (ref 70–99)

## 2020-12-26 MED ORDER — FERROUS GLUCONATE 324 (38 FE) MG PO TABS
324.0000 mg | ORAL_TABLET | Freq: Three times a day (TID) | ORAL | Status: DC
  Administered 2020-12-26 – 2020-12-30 (×10): 324 mg via ORAL
  Filled 2020-12-26 (×10): qty 1

## 2020-12-26 NOTE — Progress Notes (Signed)
Heart Failure Nurse Navigator Progress Note  Attempted to call cell phone listed and room phone number with no answer. Trying to interview/screen for HV TOC readiness. Potential issue with medication adherence and dietary/fluid modifications. Will plan to screen in AM.   ECHO: 60-65%, G2DD.   Pt remains on IV lasix at this time, SCr 1.69  Pricilla Holm, RN, BSN Heart Failure Nurse Navigator [phone]

## 2020-12-26 NOTE — Consult Note (Signed)
   [facility] Inpatient Consult   12/26/2020  EMERSYN KOTARSKI 1955/09/02 [id number]  [place] [facility] [ACO] Patient: [place] [employer]  Primary Care Provider: [facility], with Dr. Karle Plumber, this office is to provide the transition of care calls and follow up.  Patient screened for hospitalization with noted high risk score for unplanned readmission risk and  to assess for potential [place] [facility] service needs for post hospital transition.  Review of patient's medical record reveals patient is admitted for HF and was previously with several attempts with [place] [facility] for diabetes management without success of unable to reach.  Plan:  Continue to follow progress and disposition to assess for post hospital care management needs.    For questions contact:   Natividad Brood, RN BSN [facility] Liaison  [phone] business mobile phone Toll free office [phone]  Fax number: [phone] [email or web address]

## 2020-12-26 NOTE — Progress Notes (Signed)
PROGRESS NOTE  Derek Lowery TDH:[id number] DOB: [birth year] DOA: 12/24/2020 PCP: Ladell Pier, MD   LOS: 1 day   Brief Narrative / Interim history: 65 year old male with history of anemia, BPPV, chronic diarrhea, CAD, DM 2, HTN, HLD, glaucoma, chronic kidney disease stage IIIa, hemorrhoids who came into the hospital with progressive fluid buildup and shortness of breath over the last month but worse in the past few days.  He also reports dyspnea on exertion.  He was found to be significantly fluid overloaded in the ED, placed on Lasix and admitted to the hospital.  Chest x-ray showed increased interstitial markings.  Subjective / 24h Interval events: Doing well this morning, still appreciates a lot of swelling in his legs.  Tells me his weight is usually in the 240s and is 263 this morning  Assessment & Plan: Principal Problem Acute on chronic diastolic CHF-patient with progressive fluid overload, possibly due to dietary nonadherence.  Her 2D echo has been shows EF 64-68% with diastolic dysfunction.  RV is normal.  Weight on admission 266 pounds, 263 this morning.  He is net -1.1 L.  Continue IV Lasix  Active Problems Chronic kidney disease stage IIIa-Baseline creatinine around 1.4-1.6, currently at baseline.  Monitor with diuresis  Anemia, multifactorial-in the setting of chronic kidney disease as well as chronic illness, also iron deficiency.  Clinically without bleeding.  CAD Hyperlipidemia - DES in 2012 -Continue home aspirin and atorvastatin  Hypertension -BP in the 140s to 150s in the ED -Continue home hydralazine, hold amlodipine  Diabetes -On insulin with history of neuropathy and retinopathy  CBG (last 3)  Recent Labs    12/25/20 1648 12/25/20 2121 12/26/20 0551  GLUCAP 110* 102* 114*   Legal blindness  Glaucoma  -Continue home eyedrops  Scheduled Meds: . aspirin EC  81 mg Oral Daily  . atorvastatin  40 mg Oral Daily  . brimonidine  1 drop Right Eye  BID  . dorzolamide  1 drop Right Eye BID  . ferrous gluconate  324 mg Oral TID WC  . furosemide  40 mg Intravenous BID  . heparin injection (subcutaneous)  5,000 Units Subcutaneous Q8H  . hydrALAZINE  50 mg Oral Q8H  . insulin aspart  0-15 Units Subcutaneous TID WC  . insulin aspart  0-5 Units Subcutaneous QHS  . insulin aspart  5 Units Subcutaneous TID WC  . insulin glargine  25 Units Subcutaneous QHS  . latanoprost  1 drop Right Eye QHS  . melatonin  5 mg Oral QHS  . Netarsudil Dimesylate  1 drop Right Eye QHS  . sodium chloride flush  3 mL Intravenous Q12H  . timolol  1 drop Right Eye BID   Continuous Infusions: PRN Meds:.acetaminophen **OR** acetaminophen, albuterol, polyethylene glycol  Diet Orders (From admission, onward)    Start     Ordered   12/24/20 2231  Diet heart healthy/carb modified Room service appropriate? Yes; Fluid consistency: Thin; Fluid restriction: 1800 mL Fluid  Diet effective now       Question Answer Comment  Diet-HS Snack? Nothing   Room service appropriate? Yes   Fluid consistency: Thin   Fluid restriction: 1800 mL Fluid      12/24/20 2231          DVT prophylaxis: Place TED hose Start: 12/26/20 0804 heparin injection 5,000 Units Start: 12/25/20 1400 SCDs Start: 12/24/20 2230     Code Status: Full Code  Family Communication: no family at bedside   Status is: Inpatient  Remains inpatient appropriate because:Inpatient level of care appropriate due to severity of illness   Dispo: The patient is from: Home              Anticipated d/c is to: Home              Patient currently is not medically stable to d/c.   Difficult to place patient No   Level of care: Telemetry Medical  Consultants:  None   Procedures:  2D echo  Microbiology  none  Antimicrobials: none    Objective: Vitals:   12/25/20 1700 12/25/20 2021 12/26/20 0007 12/26/20 0427  BP: (!) 146/75 138/72 131/67 136/69  Pulse: 82 79 77 71  Resp: 17 18 18 18   Temp:  97.7 F (36.5 C) 97.8 F (36.6 C) 97.8 F (36.6 C) 97.6 F (36.4 C)  TempSrc: Oral Oral Oral Oral  SpO2: 97% 98% 100% 98%  Weight:    119.4 kg  Height:        Intake/Output Summary (Last 24 hours) at 12/26/2020 1046 Last data filed at 12/26/2020 0923 Gross per 24 hour  Intake 1120 ml  Output 1775 ml  Net -655 ml   Filed Weights   12/24/20 1811 12/25/20 0349 12/26/20 0427  Weight: 112.9 kg 120.9 kg 119.4 kg    Examination:  Constitutional: NAD Eyes: no scleral icterus ENMT: Mucous membranes are moist.  Neck: normal, supple Respiratory: clear to auscultation bilaterally, no wheezing, no crackles. Normal respiratory effort. No accessory muscle use.  Cardiovascular: Regular rate and rhythm, no murmurs / rubs / gallops. 3+ pitting edema Abdomen: non distended, no tenderness. Bowel sounds positive.  Musculoskeletal: no clubbing / cyanosis.  Skin: no rashes Neurologic: CN 2-12 grossly intact. Strength 5/5 in all 4.    Data Reviewed: I have independently reviewed following labs and imaging studies   CBC: Recent Labs  Lab 12/24/20 2010 12/25/20 0014 12/26/20 0504  WBC 7.4 7.3 5.4  NEUTROABS 5.5  --   --   HGB 8.5* 8.8* 7.9*  HCT 27.1* 27.7* 25.0*  MCV 86.0 84.5 86.2  PLT 167 178 786*   Basic Metabolic Panel: Recent Labs  Lab 12/24/20 2010 12/25/20 0014 12/26/20 0504  NA 137 138 140  K 3.6 3.6 3.7  CL 104 103 103  CO2 24 26 28   GLUCOSE 174* 165* 118*  BUN 23 21 25*  CREATININE 1.69* 1.72* 1.69*  CALCIUM 8.6* 8.6* 8.6*  MG  --  2.0  --    Liver Function Tests: Recent Labs  Lab 12/25/20 0014  AST 19  ALT 13  ALKPHOS 74  BILITOT 0.6  PROT 6.1*  ALBUMIN 3.1*   Coagulation Profile: No results for input(s): INR, PROTIME in the last 168 hours. HbA1C: No results for input(s): HGBA1C in the last 72 hours. CBG: Recent Labs  Lab 12/25/20 0554 12/25/20 1055 12/25/20 1648 12/25/20 2121 12/26/20 0551  GLUCAP 166* 94 110* 102* 114*    Recent Results  (from the past 240 hour(s))  SARS CORONAVIRUS 2 (TAT 6-24 HRS) Nasopharyngeal Nasopharyngeal Swab     Status: None   Collection Time: 12/24/20  8:13 PM   Specimen: Nasopharyngeal Swab  Result Value Ref Range Status   SARS Coronavirus 2 NEGATIVE NEGATIVE Final    Comment: (NOTE) SARS-CoV-2 target nucleic acids are NOT DETECTED.  The SARS-CoV-2 RNA is generally detectable in upper and lower respiratory specimens during the acute phase of infection. Negative results do not preclude SARS-CoV-2 infection, do not rule out co-infections  with other pathogens, and should not be used as the sole basis for treatment or other patient management decisions. Negative results must be combined with clinical observations, patient history, and epidemiological information. The expected result is Negative.  Fact Sheet for Patients: SugarRoll.be  Fact Sheet for Healthcare Providers: https://www.woods-mathews.com/  This test is not yet approved or cleared by the [place] FDA and  has been authorized for detection and/or diagnosis of SARS-CoV-2 by FDA under an Emergency Use Authorization (EUA). This EUA will remain  in effect (meaning this test can be used) for the duration of the COVID-19 declaration under Se ction 564(b)(1) of the Act, 21 U.S.C. section 360bbb-3(b)(1), unless the authorization is terminated or revoked sooner.  Performed at [facility], [place] [address], [place], [place] [address]      Radiology Studies: ECHOCARDIOGRAM COMPLETE  Result Date: 12/25/2020    ECHOCARDIOGRAM REPORT   Patient Name:   Derek Lowery Date of Exam: 12/25/2020 Medical Rec #:  [id number]        Height:       70.0 in Accession #:    [id number]       Weight:       266.5 lb Date of Birth:  Aug 28, 1955        BSA:          2.358 m Patient Age:    65 years         BP:           147/75 mmHg Patient Gender: M                HR:           84 bpm. Exam Location:   Inpatient Procedure: 2D Echo, Cardiac Doppler and Color Doppler Indications:    Dyspnea  History:        Patient has prior history of Echocardiogram examinations, most                 recent 09/21/2017. CAD and Previous Myocardial Infarction; Risk                 Factors:Hypertension, Diabetes and Dyslipidemia.  Sonographer:    Cammy Brochure Referring Phys: [id number] [place]  1. Left ventricular ejection fraction, by estimation, is 60 to 65%. The left ventricle has normal function. The left ventricle has no regional wall motion abnormalities. Left ventricular diastolic parameters are consistent with Grade II diastolic dysfunction (pseudonormalization). Elevated left atrial pressure.  2. Right ventricular systolic function is normal. The right ventricular size is normal. There is mildly elevated pulmonary artery systolic pressure.  3. The mitral valve is normal in structure. Trivial mitral valve regurgitation. No evidence of mitral stenosis.  4. The aortic valve is tricuspid. Aortic valve regurgitation is not visualized. No aortic stenosis is present.  5. The inferior vena cava is normal in size with greater than 50% respiratory variability, suggesting right atrial pressure of 3 mmHg. Comparison(s): Prior images unable to be directly viewed, comparison made by report only. The left ventricular systolic function is unchanged. The left ventricular diastolic function is significantly worse. FINDINGS  Left Ventricle: Left ventricular ejection fraction, by estimation, is 60 to 65%. The left ventricle has normal function. The left ventricle has no regional wall motion abnormalities. The left ventricular internal cavity size was normal in size. There is  borderline concentric left ventricular hypertrophy. Left ventricular diastolic parameters are consistent with Grade II diastolic dysfunction (pseudonormalization). Elevated left atrial  pressure. Right Ventricle: The right ventricular size is normal.  No increase in right ventricular wall thickness. Right ventricular systolic function is normal. There is mildly elevated pulmonary artery systolic pressure. The tricuspid regurgitant velocity is 2.96  m/s, and with an assumed right atrial pressure of 3 mmHg, the estimated right ventricular systolic pressure is 27.0 mmHg. Left Atrium: Left atrial size was normal in size. Right Atrium: Right atrial size was normal in size. Pericardium: There is no evidence of pericardial effusion. Mitral Valve: The mitral valve is normal in structure. Trivial mitral valve regurgitation. No evidence of mitral valve stenosis. MV peak gradient, 10.4 mmHg. The mean mitral valve gradient is 4.0 mmHg. Tricuspid Valve: The tricuspid valve is normal in structure. Tricuspid valve regurgitation is trivial. No evidence of tricuspid stenosis. Aortic Valve: The aortic valve is tricuspid. Aortic valve regurgitation is not visualized. No aortic stenosis is present. Aortic valve mean gradient measures 5.0 mmHg. Aortic valve peak gradient measures 8.8 mmHg. Aortic valve area, by VTI measures 1.82 cm. Pulmonic Valve: The pulmonic valve was normal in structure. Pulmonic valve regurgitation is not visualized. No evidence of pulmonic stenosis. Aorta: The aortic root is normal in size and structure. Venous: The inferior vena cava is normal in size with greater than 50% respiratory variability, suggesting right atrial pressure of 3 mmHg. IAS/Shunts: No atrial level shunt detected by color flow Doppler. Additional Comments: A is visualized in the right atrium.  LEFT VENTRICLE PLAX 2D LVIDd:         4.60 cm  Diastology LVIDs:         3.30 cm  LV e' medial:    6.31 cm/s LV PW:         1.00 cm  LV E/e' medial:  23.1 LV IVS:        1.20 cm  LV e' lateral:   8.81 cm/s LVOT diam:     2.00 cm  LV E/e' lateral: 16.6 LV SV:         58 LV SV Index:   25 LVOT Area:     3.14 cm  RIGHT VENTRICLE             IVC RV Basal diam:  4.10 cm     IVC diam: 1.10 cm RV S prime:      14.70 cm/s TAPSE (M-mode): 2.4 cm LEFT ATRIUM             Index       RIGHT ATRIUM           Index LA diam:        3.60 cm 1.53 cm/m  RA Area:     18.20 cm LA Vol (A2C):   61.6 ml 26.12 ml/m RA Volume:   50.80 ml  21.54 ml/m LA Vol (A4C):   66.8 ml 28.33 ml/m LA Biplane Vol: 70.2 ml 29.77 ml/m  AORTIC VALVE AV Area (Vmax):    1.96 cm AV Area (Vmean):   1.83 cm AV Area (VTI):     1.82 cm AV Vmax:           148.00 cm/s AV Vmean:          109.000 cm/s AV VTI:            0.319 m AV Peak Grad:      8.8 mmHg AV Mean Grad:      5.0 mmHg LVOT Vmax:         92.50 cm/s LVOT Vmean:  63.500 cm/s LVOT VTI:          0.185 m LVOT/AV VTI ratio: 0.58  AORTA Ao Root diam: 2.50 cm MITRAL VALVE                TRICUSPID VALVE MV Area (PHT): 5.16 cm     TR Peak grad:   35.0 mmHg MV Area VTI:   1.69 cm     TR Vmax:        295.91 cm/s MV Peak grad:  10.4 mmHg MV Mean grad:  4.0 mmHg     SHUNTS MV Vmax:       1.61 m/s     Systemic VTI:  0.18 m MV Vmean:      92.4 cm/s    Systemic Diam: 2.00 cm MV Decel Time: 147 msec MV E velocity: 146.00 cm/s MV A velocity: 115.00 cm/s MV E/A ratio:  1.27 Mihai Croitoru MD Electronically signed by Sanda Klein MD Signature Date/Time: 12/25/2020/2:08:23 PM    Final    Marzetta Board, MD, PhD [facility]  Between 7 am - 7 pm I am available, please contact me via Amion (for emergencies) or Securechat (non urgent messages)  Between 7 pm - 7 am I am not available, please contact night coverage MD/APP via Amion

## 2020-12-27 ENCOUNTER — Encounter (HOSPITAL_COMMUNITY): Payer: Self-pay | Admitting: Internal Medicine

## 2020-12-27 LAB — GLUCOSE, CAPILLARY
Glucose-Capillary: 121 mg/dL — ABNORMAL HIGH (ref 70–99)
Glucose-Capillary: 107 mg/dL — ABNORMAL HIGH (ref 70–99)
Glucose-Capillary: 140 mg/dL — ABNORMAL HIGH (ref 70–99)
Glucose-Capillary: 80 mg/dL (ref 70–99)

## 2020-12-27 LAB — BASIC METABOLIC PANEL
Anion gap: 7 (ref 5–15)
BUN: 31 mg/dL — ABNORMAL HIGH (ref 8–23)
CO2: 29 mmol/L (ref 22–32)
Calcium: 8.7 mg/dL — ABNORMAL LOW (ref 8.9–10.3)
Chloride: 104 mmol/L (ref 98–111)
Creatinine, Ser: 1.96 mg/dL — ABNORMAL HIGH (ref 0.61–1.24)
GFR, Estimated: 38 mL/min — ABNORMAL LOW (ref >60)
Glucose, Bld: 128 mg/dL — ABNORMAL HIGH (ref 70–99)
Potassium: 4 mmol/L (ref 3.5–5.1)
Sodium: 140 mmol/L (ref 135–145)

## 2020-12-27 LAB — CBC
HCT: 25.3 % — ABNORMAL LOW (ref 39.0–52.0)
Hemoglobin: 8.2 g/dL — ABNORMAL LOW (ref 13.0–17.0)
MCH: 27.5 pg (ref 26.0–34.0)
MCHC: 32.4 g/dL (ref 30.0–36.0)
MCV: 84.9 fL (ref 80.0–100.0)
Platelets: 167 10*3/uL (ref 150–400)
RBC: 2.98 MIL/uL — ABNORMAL LOW (ref 4.22–5.81)
RDW: 14.2 % (ref 11.5–15.5)
WBC: 6.9 10*3/uL (ref 4.0–10.5)
nRBC: 0 % (ref 0.0–0.2)

## 2020-12-27 NOTE — Progress Notes (Signed)
PROGRESS NOTE  Derek Lowery:[id number] DOB: 19-Aug-1955 DOA: 12/24/2020 PCP: Ladell Pier, MD   LOS: 2 days   Brief Narrative / Interim history: 65 year old male with history of anemia, BPPV, chronic diarrhea, CAD, DM 2, HTN, HLD, glaucoma, chronic kidney disease stage IIIa, hemorrhoids who came into the hospital with progressive fluid buildup and shortness of breath over the last month but worse in the past few days.  He also reports dyspnea on exertion.  He was found to be significantly fluid overloaded in the ED, placed on Lasix and admitted to the hospital.  Chest x-ray showed increased interstitial markings.  Subjective / 24h Interval events: Remains swollen but improving. Weight down 7 lbs  Assessment & Plan: Principal Problem Acute on chronic diastolic CHF-patient with progressive fluid overload, possibly due to dietary nonadherence.  Her 2D echo has been shows EF 81-19% with diastolic dysfunction.  RV is normal.  Weight on admission 266 pounds, 259 this morning. Cr slightly up, however remains significantly fluids overloaded.  Continue Lasix, blood pressure is stable  Active Problems AKI on chronic kidney disease stage IIIa-Baseline creatinine around 1.4-1.6, currently slightly up at 1.9.  Monitor with diuresis  Anemia, multifactorial-in the setting of chronic kidney disease as well as chronic illness, also iron deficiency.  Clinically without bleeding.  CAD Hyperlipidemia - DES in 2012 -Continue home aspirin and atorvastatin  Hypertension -BP in the 140s to 150s in the ED -Continue home hydralazine, hold amlodipine  Diabetes -On insulin with history of neuropathy and retinopathy  CBG (last 3)  Recent Labs    12/26/20 1624 12/26/20 2102 12/27/20 0614  GLUCAP 95 159* 121*   Legal blindness  Glaucoma  -Continue home eyedrops  Scheduled Meds: . aspirin EC  81 mg Oral Daily  . atorvastatin  40 mg Oral Daily  . brimonidine  1 drop Right Eye BID  .  dorzolamide  1 drop Right Eye BID  . ferrous gluconate  324 mg Oral TID WC  . furosemide  40 mg Intravenous BID  . heparin injection (subcutaneous)  5,000 Units Subcutaneous Q8H  . hydrALAZINE  50 mg Oral Q8H  . insulin aspart  0-15 Units Subcutaneous TID WC  . insulin aspart  0-5 Units Subcutaneous QHS  . insulin aspart  5 Units Subcutaneous TID WC  . insulin glargine  25 Units Subcutaneous QHS  . latanoprost  1 drop Right Eye QHS  . melatonin  5 mg Oral QHS  . Netarsudil Dimesylate  1 drop Right Eye QHS  . sodium chloride flush  3 mL Intravenous Q12H  . timolol  1 drop Right Eye BID   Continuous Infusions: PRN Meds:.acetaminophen **OR** acetaminophen, albuterol, polyethylene glycol  Diet Orders (From admission, onward)    Start     Ordered   12/24/20 2231  Diet heart healthy/carb modified Room service appropriate? Yes; Fluid consistency: Thin; Fluid restriction: 1800 mL Fluid  Diet effective now       Question Answer Comment  Diet-HS Snack? Nothing   Room service appropriate? Yes   Fluid consistency: Thin   Fluid restriction: 1800 mL Fluid      12/24/20 2231          DVT prophylaxis: Place TED hose Start: 12/26/20 0804 heparin injection 5,000 Units Start: 12/25/20 1400 SCDs Start: 12/24/20 2230     Code Status: Full Code  Family Communication: no family at bedside   Status is: Inpatient  Remains inpatient appropriate because:Inpatient level of care appropriate due to severity  of illness   Dispo: The patient is from: Home              Anticipated d/c is to: Home              Patient currently is not medically stable to d/c.   Difficult to place patient No   Level of care: Telemetry Medical  Consultants:  None   Procedures:  2D echo  Microbiology  none  Antimicrobials: none    Objective: Vitals:   12/26/20 1142 12/26/20 1648 12/26/20 2314 12/27/20 0437  BP: 127/68 140/78 (!) 146/72 137/72  Pulse: 66 77 79 71  Resp: 18 18  18   Temp: 97.7 F  (36.5 C) 98.6 F (37 C) 97.9 F (36.6 C) 97.7 F (36.5 C)  TempSrc: Oral Oral Oral Oral  SpO2: 95% 95% 97% 96%  Weight:    117.8 kg  Height:        Intake/Output Summary (Last 24 hours) at 12/27/2020 1008 Last data filed at 12/27/2020 7253 Gross per 24 hour  Intake 920 ml  Output 2375 ml  Net -1455 ml   Filed Weights   12/25/20 0349 12/26/20 0427 12/27/20 0437  Weight: 120.9 kg 119.4 kg 117.8 kg    Examination:  Constitutional: He is in no distress Eyes: No icterus ENMT: mmm Neck: normal, supple Respiratory: Clear bilaterally, no wheezing, no crackles Cardiovascular: Regular rate and rhythm, no murmurs, 2+ edema Abdomen: Soft, NT, ND, bowel sounds positive Musculoskeletal: no clubbing / cyanosis.  Skin: No rashes seen Neurologic: Nonfocal, equal strength   Data Reviewed: I have independently reviewed following labs and imaging studies   CBC: Recent Labs  Lab 12/24/20 2010 12/25/20 0014 12/26/20 0504 12/27/20 0048  WBC 7.4 7.3 5.4 6.9  NEUTROABS 5.5  --   --   --   HGB 8.5* 8.8* 7.9* 8.2*  HCT 27.1* 27.7* 25.0* 25.3*  MCV 86.0 84.5 86.2 84.9  PLT 167 178 148* 664   Basic Metabolic Panel: Recent Labs  Lab 12/24/20 2010 12/25/20 0014 12/26/20 0504 12/27/20 0048  NA 137 138 140 140  K 3.6 3.6 3.7 4.0  CL 104 103 103 104  CO2 24 26 28 29   GLUCOSE 174* 165* 118* 128*  BUN 23 21 25* 31*  CREATININE 1.69* 1.72* 1.69* 1.96*  CALCIUM 8.6* 8.6* 8.6* 8.7*  MG  --  2.0  --   --    Liver Function Tests: Recent Labs  Lab 12/25/20 0014  AST 19  ALT 13  ALKPHOS 74  BILITOT 0.6  PROT 6.1*  ALBUMIN 3.1*   Coagulation Profile: No results for input(s): INR, PROTIME in the last 168 hours. HbA1C: No results for input(s): HGBA1C in the last 72 hours. CBG: Recent Labs  Lab 12/26/20 0551 12/26/20 1100 12/26/20 1624 12/26/20 2102 12/27/20 0614  GLUCAP 114* 88 95 159* 121*    Recent Results (from the past 240 hour(s))  SARS CORONAVIRUS 2 (TAT 6-24  HRS) Nasopharyngeal Nasopharyngeal Swab     Status: None   Collection Time: 12/24/20  8:13 PM   Specimen: Nasopharyngeal Swab  Result Value Ref Range Status   SARS Coronavirus 2 NEGATIVE NEGATIVE Final    Comment: (NOTE) SARS-CoV-2 target nucleic acids are NOT DETECTED.  The SARS-CoV-2 RNA is generally detectable in upper and lower respiratory specimens during the acute phase of infection. Negative results do not preclude SARS-CoV-2 infection, do not rule out co-infections with other pathogens, and should not be used as the sole basis  for treatment or other patient management decisions. Negative results must be combined with clinical observations, patient history, and epidemiological information. The expected result is Negative.  Fact Sheet for Patients: SugarRoll.be  Fact Sheet for Healthcare Providers: https://www.woods-mathews.com/  This test is not yet approved or cleared by the [place] FDA and  has been authorized for detection and/or diagnosis of SARS-CoV-2 by FDA under an Emergency Use Authorization (EUA). This EUA will remain  in effect (meaning this test can be used) for the duration of the COVID-19 declaration under Se ction 564(b)(1) of the Act, 21 U.S.C. section 360bbb-3(b)(1), unless the authorization is terminated or revoked sooner.  Performed at [facility], [place] [address] [place], [place] [address]      Radiology Studies: No results found. Marzetta Board, MD, PhD [facility]  Between 7 am - 7 pm I am available, please contact me via Amion (for emergencies) or Securechat (non urgent messages)  Between 7 pm - 7 am I am not available, please contact night coverage MD/APP via Amion

## 2020-12-28 LAB — BASIC METABOLIC PANEL
Anion gap: 7 (ref 5–15)
BUN: 31 mg/dL — ABNORMAL HIGH (ref 8–23)
CO2: 30 mmol/L (ref 22–32)
Calcium: 8.9 mg/dL (ref 8.9–10.3)
Chloride: 103 mmol/L (ref 98–111)
Creatinine, Ser: 1.63 mg/dL — ABNORMAL HIGH (ref 0.61–1.24)
GFR, Estimated: 47 mL/min — ABNORMAL LOW (ref >60)
Glucose, Bld: 70 mg/dL (ref 70–99)
Potassium: 4.2 mmol/L (ref 3.5–5.1)
Sodium: 140 mmol/L (ref 135–145)

## 2020-12-28 LAB — CBC
HCT: 27.8 % — ABNORMAL LOW (ref 39.0–52.0)
Hemoglobin: 8.7 g/dL — ABNORMAL LOW (ref 13.0–17.0)
MCH: 27 pg (ref 26.0–34.0)
MCHC: 31.3 g/dL (ref 30.0–36.0)
MCV: 86.3 fL (ref 80.0–100.0)
Platelets: 191 10*3/uL (ref 150–400)
RBC: 3.22 MIL/uL — ABNORMAL LOW (ref 4.22–5.81)
RDW: 14.4 % (ref 11.5–15.5)
WBC: 6.8 10*3/uL (ref 4.0–10.5)
nRBC: 0 % (ref 0.0–0.2)

## 2020-12-28 LAB — GLUCOSE, CAPILLARY
Glucose-Capillary: 63 mg/dL — ABNORMAL LOW (ref 70–99)
Glucose-Capillary: 97 mg/dL (ref 70–99)
Glucose-Capillary: 118 mg/dL — ABNORMAL HIGH (ref 70–99)
Glucose-Capillary: 126 mg/dL — ABNORMAL HIGH (ref 70–99)
Glucose-Capillary: 101 mg/dL — ABNORMAL HIGH (ref 70–99)

## 2020-12-28 NOTE — Progress Notes (Signed)
Patient blood sugar dropped to 63 this morning.Patient is asymptomatic hypoglycemic protocol followed and 1 cup of sprite and 2 packs of graham crackers given.Blood sugar rechecked up to 97.Will continue to monitor.

## 2020-12-28 NOTE — Progress Notes (Signed)
PROGRESS NOTE  Derek Lowery IBB:[id number] DOB: 02-Jul-1956 DOA: 12/24/2020 PCP: Ladell Pier, MD   LOS: 3 days   Brief Narrative / Interim history: 65 year old male with history of anemia, BPPV, chronic diarrhea, CAD, DM 2, HTN, HLD, glaucoma, chronic kidney disease stage IIIa, hemorrhoids who came into the hospital with progressive fluid buildup and shortness of breath over the last month but worse in the past few days.  He also reports dyspnea on exertion.  He was found to be significantly fluid overloaded in the ED, placed on Lasix and admitted to the hospital.  Chest x-ray showed increased interstitial markings.  Subjective / 24h Interval events: Shortness of breath improved, swelling is coming down  Assessment & Plan: Principal Problem Acute on chronic diastolic CHF-patient with progressive fluid overload, possibly due to dietary nonadherence.  His 2D echo has been shows EF 45-03% with diastolic dysfunction.  RV is normal.  Weight on admission 266 pounds, 255 this morning.  Creatinine at baseline today.  Continue Lasix.  He is net -4.6 L  Active Problems AKI on chronic kidney disease stage IIIa-Baseline creatinine around 1.4-1.6, very with diuresis up to 1.9 yesterday but back to baseline this morning.  Continue diuresis  Anemia, multifactorial-in the setting of chronic kidney disease as well as chronic illness, also iron deficiency.  Clinically without bleeding.  CAD Hyperlipidemia - DES in 2012 -Continue home aspirin and atorvastatin  Hypertension -BP in the 140s to 150s in the ED -Continue home hydralazine, hold amlodipine  Diabetes -On insulin with history of neuropathy and retinopathy  CBG (last 3)  Recent Labs    12/27/20 2055 12/28/20 0558 12/28/20 0622  GLUCAP 80 63* 97   Legal blindness  Glaucoma  -Continue home eyedrops  Scheduled Meds: . aspirin EC  81 mg Oral Daily  . atorvastatin  40 mg Oral Daily  . brimonidine  1 drop Right Eye BID  .  dorzolamide  1 drop Right Eye BID  . ferrous gluconate  324 mg Oral TID WC  . furosemide  40 mg Intravenous BID  . heparin injection (subcutaneous)  5,000 Units Subcutaneous Q8H  . hydrALAZINE  50 mg Oral Q8H  . insulin aspart  0-15 Units Subcutaneous TID WC  . insulin aspart  0-5 Units Subcutaneous QHS  . insulin aspart  5 Units Subcutaneous TID WC  . insulin glargine  25 Units Subcutaneous QHS  . latanoprost  1 drop Right Eye QHS  . melatonin  5 mg Oral QHS  . Netarsudil Dimesylate  1 drop Right Eye QHS  . sodium chloride flush  3 mL Intravenous Q12H  . timolol  1 drop Right Eye BID   Continuous Infusions: PRN Meds:.acetaminophen **OR** acetaminophen, albuterol, polyethylene glycol  Diet Orders (From admission, onward)    Start     Ordered   12/24/20 2231  Diet heart healthy/carb modified Room service appropriate? Yes; Fluid consistency: Thin; Fluid restriction: 1800 mL Fluid  Diet effective now       Question Answer Comment  Diet-HS Snack? Nothing   Room service appropriate? Yes   Fluid consistency: Thin   Fluid restriction: 1800 mL Fluid      12/24/20 2231          DVT prophylaxis: Place TED hose Start: 12/26/20 0804 heparin injection 5,000 Units Start: 12/25/20 1400 SCDs Start: 12/24/20 2230     Code Status: Full Code  Family Communication: no family at bedside   Status is: Inpatient  Remains inpatient appropriate because:Inpatient level  of care appropriate due to severity of illness   Dispo: The patient is from: Home              Anticipated d/c is to: Home              Patient currently is not medically stable to d/c.   Difficult to place patient No   Level of care: Telemetry Medical  Consultants:  None   Procedures:  2D echo  Microbiology  none  Antimicrobials: none    Objective: Vitals:   12/27/20 1639 12/27/20 2146 12/27/20 2338 12/28/20 0453  BP: 135/71 (!) 163/76 126/66 138/73  Pulse: 76 75 69 69  Resp: 18 18 20 18   Temp: 97.8 F  (36.6 C) 97.8 F (36.6 C) 97.9 F (36.6 C) 97.9 F (36.6 C)  TempSrc: Oral Oral Oral Oral  SpO2: 97% 98% 97% 96%  Weight:    115.9 kg  Height:        Intake/Output Summary (Last 24 hours) at 12/28/2020 0957 Last data filed at 12/28/2020 0456 Gross per 24 hour  Intake 120 ml  Output 2400 ml  Net -2280 ml   Filed Weights   12/26/20 0427 12/27/20 0437 12/28/20 0453  Weight: 119.4 kg 117.8 kg 115.9 kg    Examination:  Constitutional: NAD Eyes: No icterus ENMT: mmm Neck: normal, supple Respiratory: Clear bilaterally, no wheezing, no crackles Cardiovascular: Regular rate and rhythm, no murmurs, 2+ pitting edema improved Abdomen: NT, ND, bowel sounds positive Musculoskeletal: no clubbing / cyanosis.  Skin: Chronic venous stasis changes, no new rashes Neurologic: No focal deficits   Data Reviewed: I have independently reviewed following labs and imaging studies   CBC: Recent Labs  Lab 12/24/20 2010 12/25/20 0014 12/26/20 0504 12/27/20 0048 12/28/20 0559  WBC 7.4 7.3 5.4 6.9 6.8  NEUTROABS 5.5  --   --   --   --   HGB 8.5* 8.8* 7.9* 8.2* 8.7*  HCT 27.1* 27.7* 25.0* 25.3* 27.8*  MCV 86.0 84.5 86.2 84.9 86.3  PLT 167 178 148* 167 458   Basic Metabolic Panel: Recent Labs  Lab 12/24/20 2010 12/25/20 0014 12/26/20 0504 12/27/20 0048 12/28/20 0559  NA 137 138 140 140 140  K 3.6 3.6 3.7 4.0 4.2  CL 104 103 103 104 103  CO2 24 26 28 29 30   GLUCOSE 174* 165* 118* 128* 70  BUN 23 21 25* 31* 31*  CREATININE 1.69* 1.72* 1.69* 1.96* 1.63*  CALCIUM 8.6* 8.6* 8.6* 8.7* 8.9  MG  --  2.0  --   --   --    Liver Function Tests: Recent Labs  Lab 12/25/20 0014  AST 19  ALT 13  ALKPHOS 74  BILITOT 0.6  PROT 6.1*  ALBUMIN 3.1*   Coagulation Profile: No results for input(s): INR, PROTIME in the last 168 hours. HbA1C: No results for input(s): HGBA1C in the last 72 hours. CBG: Recent Labs  Lab 12/27/20 1114 12/27/20 1607 12/27/20 2055 12/28/20 0558  12/28/20 0622  GLUCAP 107* 140* 80 63* 97    Recent Results (from the past 240 hour(s))  SARS CORONAVIRUS 2 (TAT 6-24 HRS) Nasopharyngeal Nasopharyngeal Swab     Status: None   Collection Time: 12/24/20  8:13 PM   Specimen: Nasopharyngeal Swab  Result Value Ref Range Status   SARS Coronavirus 2 NEGATIVE NEGATIVE Final    Comment: (NOTE) SARS-CoV-2 target nucleic acids are NOT DETECTED.  The SARS-CoV-2 RNA is generally detectable in upper and lower respiratory specimens  during the acute phase of infection. Negative results do not preclude SARS-CoV-2 infection, do not rule out co-infections with other pathogens, and should not be used as the sole basis for treatment or other patient management decisions. Negative results must be combined with clinical observations, patient history, and epidemiological information. The expected result is Negative.  Fact Sheet for Patients: SugarRoll.be  Fact Sheet for Healthcare Providers: https://www.woods-mathews.com/  This test is not yet approved or cleared by the [place] FDA and  has been authorized for detection and/or diagnosis of SARS-CoV-2 by FDA under an Emergency Use Authorization (EUA). This EUA will remain  in effect (meaning this test can be used) for the duration of the COVID-19 declaration under Se ction 564(b)(1) of the Act, 21 U.S.C. section 360bbb-3(b)(1), unless the authorization is terminated or revoked sooner.  Performed at [facility], [place] [address], [place], [place] [address]      Radiology Studies: No results found. Marzetta Board, MD, PhD [facility]  Between 7 am - 7 pm I am available, please contact me via Amion (for emergencies) or Securechat (non urgent messages)  Between 7 pm - 7 am I am not available, please contact night coverage MD/APP via Amion

## 2020-12-28 NOTE — Care Management Important Message (Signed)
Important Message  Patient Details  Name: Derek Lowery MRN: [id number] Date of Birth: 1955-11-10   Medicare Important Message Given:  Yes - Important Message mailed due to current National Emergency   Verbal consent obtained due to current National Emergency  Relationship to patient: Self Contact Name: Job Call Date: 12/28/20  Time: 0911 Phone: [phone] Outcome: Spoke with contact Important Message mailed to: Patient address on file    Delorse Lek 12/28/2020, 9:11 AM

## 2020-12-29 LAB — BASIC METABOLIC PANEL
Anion gap: 9 (ref 5–15)
BUN: 34 mg/dL — ABNORMAL HIGH (ref 8–23)
CO2: 29 mmol/L (ref 22–32)
Calcium: 8.9 mg/dL (ref 8.9–10.3)
Chloride: 103 mmol/L (ref 98–111)
Creatinine, Ser: 1.72 mg/dL — ABNORMAL HIGH (ref 0.61–1.24)
GFR, Estimated: 44 mL/min — ABNORMAL LOW (ref >60)
Glucose, Bld: 120 mg/dL — ABNORMAL HIGH (ref 70–99)
Potassium: 4.1 mmol/L (ref 3.5–5.1)
Sodium: 141 mmol/L (ref 135–145)

## 2020-12-29 LAB — CBC
HCT: 27.4 % — ABNORMAL LOW (ref 39.0–52.0)
Hemoglobin: 8.7 g/dL — ABNORMAL LOW (ref 13.0–17.0)
MCH: 27 pg (ref 26.0–34.0)
MCHC: 31.8 g/dL (ref 30.0–36.0)
MCV: 85.1 fL (ref 80.0–100.0)
Platelets: 196 10*3/uL (ref 150–400)
RBC: 3.22 MIL/uL — ABNORMAL LOW (ref 4.22–5.81)
RDW: 14.3 % (ref 11.5–15.5)
WBC: 7.4 10*3/uL (ref 4.0–10.5)
nRBC: 0 % (ref 0.0–0.2)

## 2020-12-29 LAB — GLUCOSE, CAPILLARY
Glucose-Capillary: 103 mg/dL — ABNORMAL HIGH (ref 70–99)
Glucose-Capillary: 120 mg/dL — ABNORMAL HIGH (ref 70–99)
Glucose-Capillary: 176 mg/dL — ABNORMAL HIGH (ref 70–99)
Glucose-Capillary: 135 mg/dL — ABNORMAL HIGH (ref 70–99)

## 2020-12-29 MED ORDER — TORSEMIDE 20 MG PO TABS
40.0000 mg | ORAL_TABLET | Freq: Every day | ORAL | Status: DC
  Administered 2020-12-29 – 2020-12-30 (×2): 40 mg via ORAL
  Filled 2020-12-29 (×2): qty 2

## 2020-12-29 NOTE — Progress Notes (Signed)
PROGRESS NOTE  MARKEE Lowery IEP:[id number] DOB: November 09, 1955 DOA: 12/24/2020 PCP: Derek Pier, MD   LOS: 4 days   Brief Narrative / Interim history: 65 year old male with history of anemia, BPPV, chronic diarrhea, CAD, DM 2, HTN, HLD, glaucoma, chronic kidney disease stage IIIa, hemorrhoids who came into the hospital with progressive fluid buildup and shortness of breath over the last month but worse in the past few days.  He also reports dyspnea on exertion.  He was found to be significantly fluid overloaded in the ED, placed on Lasix and admitted to the hospital.  Chest x-ray showed increased interstitial markings.  Subjective / 24h Interval events: Feeling good, swelling continues to improve  Assessment & Plan: Principal Problem Acute on chronic diastolic CHF-patient with progressive fluid overload, possibly due to dietary nonadherence.  His 2D echo has been shows EF 66-06% with diastolic dysfunction.  RV is normal.  Weight on admission 266 pounds, 251 this morning for total of 15 pounds.  Switch to torsemide today, if continues to diurese DC home tomorrow  Active Problems AKI on chronic kidney disease stage IIIa-Baseline creatinine around 1.4-1.6, very with diuresis up to 1.9 but back to continue to monitor, on torsemide today   Anemia, multifactorial-in the setting of chronic kidney disease as well as chronic illness, also iron deficiency.  Clinically without bleeding.  CAD Hyperlipidemia - DES in 2012 -Continue home aspirin and atorvastatin  Hypertension -BP in the 140s to 150s in the ED -Continue home hydralazine, hold amlodipine  Diabetes -On insulin with history of neuropathy and retinopathy  CBG (last 3)  Recent Labs    12/28/20 2131 12/29/20 0604 12/29/20 1100  GLUCAP 101* 103* 120*   Legal blindness  Glaucoma  -Continue home eyedrops  Scheduled Meds: . aspirin EC  81 mg Oral Daily  . atorvastatin  40 mg Oral Daily  . brimonidine  1 drop Right Eye  BID  . dorzolamide  1 drop Right Eye BID  . ferrous gluconate  324 mg Oral TID WC  . heparin injection (subcutaneous)  5,000 Units Subcutaneous Q8H  . hydrALAZINE  50 mg Oral Q8H  . insulin aspart  0-15 Units Subcutaneous TID WC  . insulin aspart  0-5 Units Subcutaneous QHS  . insulin aspart  5 Units Subcutaneous TID WC  . insulin glargine  25 Units Subcutaneous QHS  . latanoprost  1 drop Right Eye QHS  . melatonin  5 mg Oral QHS  . Netarsudil Dimesylate  1 drop Right Eye QHS  . sodium chloride flush  3 mL Intravenous Q12H  . timolol  1 drop Right Eye BID  . torsemide  40 mg Oral Daily   Continuous Infusions: PRN Meds:.acetaminophen **OR** acetaminophen, albuterol, polyethylene glycol  Diet Orders (From admission, onward)    Start     Ordered   12/24/20 2231  Diet heart healthy/carb modified Room service appropriate? Yes; Fluid consistency: Thin; Fluid restriction: 1800 mL Fluid  Diet effective now       Question Answer Comment  Diet-HS Snack? Nothing   Room service appropriate? Yes   Fluid consistency: Thin   Fluid restriction: 1800 mL Fluid      12/24/20 2231          DVT prophylaxis: Place TED hose Start: 12/26/20 0804 heparin injection 5,000 Units Start: 12/25/20 1400 SCDs Start: 12/24/20 2230     Code Status: Full Code  Family Communication: no family at bedside   Status is: Inpatient  Remains inpatient appropriate because:Inpatient  level of care appropriate due to severity of illness   Dispo: The patient is from: Home              Anticipated d/c is to: Home              Patient currently is not medically stable to d/c.   Difficult to place patient No   Level of care: Telemetry Medical  Consultants:  None   Procedures:  2D echo  Microbiology  none  Antimicrobials: none    Objective: Vitals:   12/28/20 1159 12/28/20 1810 12/28/20 2359 12/29/20 0426  BP: 138/70 (!) 148/74 128/64 126/69  Pulse: 75 77 66 71  Resp: 16 16 18 18   Temp: 97.8 F  (36.6 C) 97.8 F (36.6 C) 98 F (36.7 C) 97.8 F (36.6 C)  TempSrc: Oral Oral Oral Oral  SpO2: 98% 97% 97% 99%  Weight:    113.9 kg  Height:        Intake/Output Summary (Last 24 hours) at 12/29/2020 1137 Last data filed at 12/29/2020 8250 Gross per 24 hour  Intake 240 ml  Output 2300 ml  Net -2060 ml   Filed Weights   12/27/20 0437 12/28/20 0453 12/29/20 0426  Weight: 117.8 kg 115.9 kg 113.9 kg    Examination:  Constitutional: No distress Eyes: No icterus ENMT: mmm Neck: normal, supple Respiratory: CTA bilaterally, no wheezing, no crackles Cardiovascular: Regular rate and rhythm, no murmurs, 1+ pitting edema Abdomen: Soft, NT, ND, bowel sounds positive Musculoskeletal: no clubbing / cyanosis.  Skin: Chronic venous stasis changes, no new rashes Neurologic: Nonfocal   Data Reviewed: I have independently reviewed following labs and imaging studies   CBC: Recent Labs  Lab 12/24/20 2010 12/25/20 0014 12/26/20 0504 12/27/20 0048 12/28/20 0559 12/29/20 0504  WBC 7.4 7.3 5.4 6.9 6.8 7.4  NEUTROABS 5.5  --   --   --   --   --   HGB 8.5* 8.8* 7.9* 8.2* 8.7* 8.7*  HCT 27.1* 27.7* 25.0* 25.3* 27.8* 27.4*  MCV 86.0 84.5 86.2 84.9 86.3 85.1  PLT 167 178 148* 167 191 037   Basic Metabolic Panel: Recent Labs  Lab 12/25/20 0014 12/26/20 0504 12/27/20 0048 12/28/20 0559 12/29/20 0504  NA 138 140 140 140 141  K 3.6 3.7 4.0 4.2 4.1  CL 103 103 104 103 103  CO2 26 28 29 30 29   GLUCOSE 165* 118* 128* 70 120*  BUN 21 25* 31* 31* 34*  CREATININE 1.72* 1.69* 1.96* 1.63* 1.72*  CALCIUM 8.6* 8.6* 8.7* 8.9 8.9  MG 2.0  --   --   --   --    Liver Function Tests: Recent Labs  Lab 12/25/20 0014  AST 19  ALT 13  ALKPHOS 74  BILITOT 0.6  PROT 6.1*  ALBUMIN 3.1*   Coagulation Profile: No results for input(s): INR, PROTIME in the last 168 hours. HbA1C: No results for input(s): HGBA1C in the last 72 hours. CBG: Recent Labs  Lab 12/28/20 1114 12/28/20 1559  12/28/20 2131 12/29/20 0604 12/29/20 1100  GLUCAP 118* 126* 101* 103* 120*    Recent Results (from the past 240 hour(s))  SARS CORONAVIRUS 2 (TAT 6-24 HRS) Nasopharyngeal Nasopharyngeal Swab     Status: None   Collection Time: 12/24/20  8:13 PM   Specimen: Nasopharyngeal Swab  Result Value Ref Range Status   SARS Coronavirus 2 NEGATIVE NEGATIVE Final    Comment: (NOTE) SARS-CoV-2 target nucleic acids are NOT DETECTED.  The SARS-CoV-2  RNA is generally detectable in upper and lower respiratory specimens during the acute phase of infection. Negative results do not preclude SARS-CoV-2 infection, do not rule out co-infections with other pathogens, and should not be used as the sole basis for treatment or other patient management decisions. Negative results must be combined with clinical observations, patient history, and epidemiological information. The expected result is Negative.  Fact Sheet for Patients: SugarRoll.be  Fact Sheet for Healthcare Providers: https://www.woods-mathews.com/  This test is not yet approved or cleared by the [place] FDA and  has been authorized for detection and/or diagnosis of SARS-CoV-2 by FDA under an Emergency Use Authorization (EUA). This EUA will remain  in effect (meaning this test can be used) for the duration of the COVID-19 declaration under Se ction 564(b)(1) of the Act, 21 U.S.C. section 360bbb-3(b)(1), unless the authorization is terminated or revoked sooner.  Performed at [facility], [place] [address] [place], [place] [address]      Radiology Studies: No results found. Marzetta Board, MD, PhD [facility]  Between 7 am - 7 pm I am available, please contact me via Amion (for emergencies) or Securechat (non urgent messages)  Between 7 pm - 7 am I am not available, please contact night coverage MD/APP via Amion

## 2020-12-30 DIAGNOSIS — I5021 Acute systolic (congestive) heart failure: Secondary | ICD-10-CM

## 2020-12-30 LAB — BASIC METABOLIC PANEL
Anion gap: 7 (ref 5–15)
BUN: 32 mg/dL — ABNORMAL HIGH (ref 8–23)
CO2: 28 mmol/L (ref 22–32)
Calcium: 8.4 mg/dL — ABNORMAL LOW (ref 8.9–10.3)
Chloride: 105 mmol/L (ref 98–111)
Creatinine, Ser: 1.59 mg/dL — ABNORMAL HIGH (ref 0.61–1.24)
GFR, Estimated: 49 mL/min — ABNORMAL LOW (ref >60)
Glucose, Bld: 181 mg/dL — ABNORMAL HIGH (ref 70–99)
Potassium: 3.7 mmol/L (ref 3.5–5.1)
Sodium: 140 mmol/L (ref 135–145)

## 2020-12-30 LAB — GLUCOSE, CAPILLARY
Glucose-Capillary: 140 mg/dL — ABNORMAL HIGH (ref 70–99)
Glucose-Capillary: 129 mg/dL — ABNORMAL HIGH (ref 70–99)

## 2020-12-30 MED ORDER — TORSEMIDE 40 MG PO TABS: 40.0000 mg | ORAL_TABLET | Freq: Every day | ORAL | 1 refills | Status: DC

## 2020-12-30 MED ORDER — FERROUS GLUCONATE 324 (38 FE) MG PO TABS: 324.0000 mg | ORAL_TABLET | Freq: Every day | ORAL | 0 refills | Status: DC

## 2020-12-30 NOTE — Discharge Summary (Signed)
Physician Discharge Summary  PASTOR SGRO QGB:[id number] DOB: [birth year] DOA: 12/24/2020  PCP: Ladell Pier, MD  Admit date: 12/24/2020 Discharge date: 12/30/2020  Admitted From: home Disposition:  home  Recommendations for Outpatient Follow-up:  1. Follow up with PCP in 1-2 weeks 2. Please obtain BMP/CBC in one week 3. Please follow up on the following pending results:  Home Health: none Equipment/Devices: none  Discharge Condition: stable CODE STATUS: Full code  Diet recommendation: Low-sodium heart healthy  HPI: Per admitting MD, Derek Lowery is a 65 y.o. male with medical history significant of anemia, BP PV, chronic diarrhea, CAD, diabetes, hypertension, hyperlipidemia, glaucoma, legal blindness, CKD 3A, hemorrhoids who presents with worsening edema and shortness of breath. Patient states that he has had ongoing lower extremity edema for period time he has been on diuretics for this but he is unclear what the diagnosis is.  He does not believe he is had a previous diagnosis of heart failure.  He had increasing lower extremity edema for about the past month and his PCP increased the dose but he did not have much improvement with this.  He is also had some increasing shortness of breath throughout the past 2 weeks.  He has dyspnea on exertion.  Denies orthopnea.  Reports some constipation.  He denies fevers, chills, chest pain, abdominal pain, nausea, vomiting.  Hospital Course / Discharge diagnoses: Principal Problem Acute on chronic diastolic CHF-patient with progressive fluid overload, possibly due to dietary nonadherence to low-sodium diet, also he is not weighing himself daily.  His 2D echo has been shows EF 97-58% with diastolic dysfunction.  RV is normal.  He was placed on intravenous Lasix with good results, weight on admission was 266 pounds, weight on discharge was 250 pounds.  In and outs show he is net -9 L.  He is edema improved significantly, his diuretic  regimen was changed to torsemide and he seems to be tolerating that well with good urine output and ongoing weight loss.  His kidney function has remained stable, he was discharged home in stable condition with a diuretic changes above.  Patient was counseled to weigh himself daily, he will ask his sister to read the scale because he is legally blind and cannot, was counseled WBC is worsening swelling and weight gain to increase the torsemide to twice daily and call his PCP right away.  Active Problems AKI on chronic kidney disease stage IIIa-Baseline creatinine around 1.4-1.6, went briefly up to 1.9 with diuresis but gradually returned to his baseline, 1.5 on discharge. Anemia, multifactorial-in the setting of chronic kidney disease as well as chronic illness, also iron deficiency.  Clinically without bleeding. CAD / Hyperlipidemia -DES in 2012, Continue home aspirin and atorvastatin. No chest pain Hypertension -resume home medications Diabetes -On insulin with history of neuropathy and retinopathy Legal blindness / Glaucoma -Continue home eyedrops  Sepsis ruled out   Discharge Instructions   Allergies as of 12/30/2020      Reactions   Coreg [carvedilol] Other (See Comments)   Redness and swelling of calf      Medication List    STOP taking these medications   furosemide 20 MG tablet Commonly known as: LASIX     TAKE these medications   amLODipine 10 MG tablet Commonly known as: NORVASC Take 1 tablet (10 mg total) by mouth daily.   ammonium lactate 12 % lotion Commonly known as: AmLactin Apply to both feet twice daily for dry skin.   aspirin EC 81  MG tablet Take 1 tablet (81 mg total) by mouth daily.   atorvastatin 40 MG tablet Commonly known as: LIPITOR TAKE 1 TABLET BY MOUTH EVERY DAY What changed: additional instructions   BD Pen Needle Nano U/F 32G X 4 MM Misc Generic drug: Insulin Pen Needle USE AS DIRECTED 3 TIMES A DAY. Please schedule an appointment for  additional refills.   blood glucose meter kit and supplies Kit Dispense based on patient and insurance preference. Use up to four times daily as directed. One Touch Verio   brimonidine 0.2 % ophthalmic solution Commonly known as: ALPHAGAN Place 1 drop into the right eye 2 (two) times daily.   Dexcom G6 Receiver Devi 1 Device by Does not apply route daily.   Dexcom G6 Receiver Devi 1 Device by Does not apply route daily.   FreeStyle Libre 14 Day Reader Kerrin Mo USE AS DIRECTED   Dexcom G6 Sensor Misc 1 packet by Does not apply route daily.   FreeStyle [employer] System Misc Change sensor Q 2 wks   Dexcom G6 Transmitter Misc 1 packet by Does not apply route daily.   dorzolamide 2 % ophthalmic solution Commonly known as: TRUSOPT Place 1 drop into the right eye 2 (two) times daily.   glucose blood test strip Commonly known as: Accu-Chek Aviva Plus Use as instructed for 3 times daily testing of blood sugar. E11.9   glucose monitoring kit monitoring kit 1 each by Does not apply route as needed for other.   hydrALAZINE 50 MG tablet Commonly known as: APRESOLINE Take 1 tablet (50 mg total) by mouth 3 (three) times daily.   hydrocortisone 2.5 % rectal cream Commonly known as: Anusol-HC Place 1 application rectally 2 (two) times daily as needed for hemorrhoids or anal itching (for flare of hemorrhoids).   insulin lispro 100 UNIT/ML KwikPen Commonly known as: HumaLOG KwikPen Inject 10 Units into the skin 3 (three) times daily with meals.   Lancets Misc Use as directed.  Accu chek 2   Lantus SoloStar 100 UNIT/ML Solostar Pen Generic drug: insulin glargine Inject 38 Units into the skin at bedtime.   latanoprost 0.005 % ophthalmic solution Commonly known as: XALATAN Place 1 drop into the right eye at bedtime.   multivitamin with minerals tablet Take 1 tablet by mouth daily.   Rhopressa 0.02 % Soln Generic drug: Netarsudil Dimesylate Place 1 drop into the right eye at  bedtime.   timolol 0.5 % ophthalmic solution Commonly known as: BETIMOL Place 1 drop into the right eye 2 (two) times daily.   Torsemide 40 MG Tabs Take 40 mg by mouth daily.       Consultations:  None   Procedures/Studies:  DG Chest Port 1 View  Result Date: 12/24/2020 CLINICAL DATA:  Shortness of breath. EXAM: PORTABLE CHEST 1 VIEW COMPARISON:  June 29, 2020 FINDINGS: Diffusely increased interstitial lung markings are seen with moderate severity areas of scarring, atelectasis and/or infiltrate noted within the bilateral lower lobes. There is no evidence of a pleural effusion or pneumothorax. The heart size and mediastinal contours are within normal limits. The visualized skeletal structures are unremarkable. IMPRESSION: Increased interstitial lung markings with moderate severity areas of bilateral lower lobe scarring, atelectasis and/or infiltrate. Electronically Signed   By: Virgina Norfolk M.D.   On: 12/24/2020 19:37   ECHOCARDIOGRAM COMPLETE  Result Date: 12/25/2020    ECHOCARDIOGRAM REPORT   Patient Name:   TEXAS SOUTER Date of Exam: 12/25/2020 Medical Rec #:  [id number]  Height:       70.0 in Accession #:    [id number]       Weight:       266.5 lb Date of Birth:  1956-02-19        BSA:          2.358 m Patient Age:    65 years         BP:           147/75 mmHg Patient Gender: M                HR:           84 bpm. Exam Location:  Inpatient Procedure: 2D Echo, Cardiac Doppler and Color Doppler Indications:    Dyspnea  History:        Patient has prior history of Echocardiogram examinations, most                 recent 09/21/2017. CAD and Previous Myocardial Infarction; Risk                 Factors:Hypertension, Diabetes and Dyslipidemia.  Sonographer:    Cammy Brochure Referring Phys: [id number] [place]  1. Left ventricular ejection fraction, by estimation, is 60 to 65%. The left ventricle has normal function. The left ventricle has no regional wall  motion abnormalities. Left ventricular diastolic parameters are consistent with Grade II diastolic dysfunction (pseudonormalization). Elevated left atrial pressure.  2. Right ventricular systolic function is normal. The right ventricular size is normal. There is mildly elevated pulmonary artery systolic pressure.  3. The mitral valve is normal in structure. Trivial mitral valve regurgitation. No evidence of mitral stenosis.  4. The aortic valve is tricuspid. Aortic valve regurgitation is not visualized. No aortic stenosis is present.  5. The inferior vena cava is normal in size with greater than 50% respiratory variability, suggesting right atrial pressure of 3 mmHg. Comparison(s): Prior images unable to be directly viewed, comparison made by report only. The left ventricular systolic function is unchanged. The left ventricular diastolic function is significantly worse. FINDINGS  Left Ventricle: Left ventricular ejection fraction, by estimation, is 60 to 65%. The left ventricle has normal function. The left ventricle has no regional wall motion abnormalities. The left ventricular internal cavity size was normal in size. There is  borderline concentric left ventricular hypertrophy. Left ventricular diastolic parameters are consistent with Grade II diastolic dysfunction (pseudonormalization). Elevated left atrial pressure. Right Ventricle: The right ventricular size is normal. No increase in right ventricular wall thickness. Right ventricular systolic function is normal. There is mildly elevated pulmonary artery systolic pressure. The tricuspid regurgitant velocity is 2.96  m/s, and with an assumed right atrial pressure of 3 mmHg, the estimated right ventricular systolic pressure is 34.2 mmHg. Left Atrium: Left atrial size was normal in size. Right Atrium: Right atrial size was normal in size. Pericardium: There is no evidence of pericardial effusion. Mitral Valve: The mitral valve is normal in structure. Trivial  mitral valve regurgitation. No evidence of mitral valve stenosis. MV peak gradient, 10.4 mmHg. The mean mitral valve gradient is 4.0 mmHg. Tricuspid Valve: The tricuspid valve is normal in structure. Tricuspid valve regurgitation is trivial. No evidence of tricuspid stenosis. Aortic Valve: The aortic valve is tricuspid. Aortic valve regurgitation is not visualized. No aortic stenosis is present. Aortic valve mean gradient measures 5.0 mmHg. Aortic valve peak gradient measures 8.8 mmHg. Aortic valve area, by VTI measures 1.82 cm. Pulmonic Valve: The pulmonic  valve was normal in structure. Pulmonic valve regurgitation is not visualized. No evidence of pulmonic stenosis. Aorta: The aortic root is normal in size and structure. Venous: The inferior vena cava is normal in size with greater than 50% respiratory variability, suggesting right atrial pressure of 3 mmHg. IAS/Shunts: No atrial level shunt detected by color flow Doppler. Additional Comments: A is visualized in the right atrium.  LEFT VENTRICLE PLAX 2D LVIDd:         4.60 cm  Diastology LVIDs:         3.30 cm  LV e' medial:    6.31 cm/s LV PW:         1.00 cm  LV E/e' medial:  23.1 LV IVS:        1.20 cm  LV e' lateral:   8.81 cm/s LVOT diam:     2.00 cm  LV E/e' lateral: 16.6 LV SV:         58 LV SV Index:   25 LVOT Area:     3.14 cm  RIGHT VENTRICLE             IVC RV Basal diam:  4.10 cm     IVC diam: 1.10 cm RV S prime:     14.70 cm/s TAPSE (M-mode): 2.4 cm LEFT ATRIUM             Index       RIGHT ATRIUM           Index LA diam:        3.60 cm 1.53 cm/m  RA Area:     18.20 cm LA Vol (A2C):   61.6 ml 26.12 ml/m RA Volume:   50.80 ml  21.54 ml/m LA Vol (A4C):   66.8 ml 28.33 ml/m LA Biplane Vol: 70.2 ml 29.77 ml/m  AORTIC VALVE AV Area (Vmax):    1.96 cm AV Area (Vmean):   1.83 cm AV Area (VTI):     1.82 cm AV Vmax:           148.00 cm/s AV Vmean:          109.000 cm/s AV VTI:            0.319 m AV Peak Grad:      8.8 mmHg AV Mean Grad:      5.0  mmHg LVOT Vmax:         92.50 cm/s LVOT Vmean:        63.500 cm/s LVOT VTI:          0.185 m LVOT/AV VTI ratio: 0.58  AORTA Ao Root diam: 2.50 cm MITRAL VALVE                TRICUSPID VALVE MV Area (PHT): 5.16 cm     TR Peak grad:   35.0 mmHg MV Area VTI:   1.69 cm     TR Vmax:        295.91 cm/s MV Peak grad:  10.4 mmHg MV Mean grad:  4.0 mmHg     SHUNTS MV Vmax:       1.61 m/s     Systemic VTI:  0.18 m MV Vmean:      92.4 cm/s    Systemic Diam: 2.00 cm MV Decel Time: 147 msec MV E velocity: 146.00 cm/s MV A velocity: 115.00 cm/s MV E/A ratio:  1.27 Mihai Croitoru MD Electronically signed by Sanda Klein MD Signature Date/Time: 12/25/2020/2:08:23 PM    Final    Intravitreal  Injection, Pharmacologic Agent - OD - Right Eye  Result Date: 12/05/2020 Time Out 12/05/2020. 10:45 AM. Confirmed correct patient, procedure, site, and patient consented. Anesthesia Topical anesthesia was used. Anesthetic medications included Akten 3.5%. Procedure Preparation included Tobramycin 0.3%, 10% betadine to eyelids, 5% betadine to ocular surface. A 30 gauge needle was used. Injection: 2.5 mg Bevacizumab (AVASTIN) 2.1m/0.1mL SOSY   NDC:: 62035-597-41 Lot:: [id number]  Route: Intravitreal, Site: Right Eye Post-op Post injection exam found visual acuity of at least counting fingers. The patient tolerated the procedure well. There were no complications. The patient received written and verbal post procedure care education. Post injection medications were not given.   OCT, Retina - OU - Both Eyes  Result Date: 12/05/2020 Right Eye Quality was good. Scan locations included subfoveal. Central Foveal Thickness: 311. Progression has been stable. Findings include abnormal foveal contour. Notes Chronic CSME, involved OD, repeat injection intravitreal Avastin today     Subjective: - no chest pain, shortness of breath, no abdominal pain, nausea or vomiting.   Discharge Exam: BP 122/70 (BP Location: Left Arm)   Pulse 69   Temp  97.7 F (36.5 C) (Oral)   Resp 18   Ht 5' 10"  (1.778 m)   Wt 113.4 kg   SpO2 97%   BMI 35.87 kg/m   General: Pt is alert, awake, not in acute distress Cardiovascular: RRR, S1/S2 +, no rubs, no gallops Respiratory: CTA bilaterally, no wheezing, no rhonchi Abdominal: Soft, NT, ND, bowel sounds + Extremities: trace edema, no cyanosis, venous stasis changes bilaterally    The results of significant diagnostics from this hospitalization (including imaging, microbiology, ancillary and laboratory) are listed below for reference.     Microbiology: Recent Results (from the past 240 hour(s))  SARS CORONAVIRUS 2 (TAT 6-24 HRS) Nasopharyngeal Nasopharyngeal Swab     Status: None   Collection Time: 12/24/20  8:13 PM   Specimen: Nasopharyngeal Swab  Result Value Ref Range Status   SARS Coronavirus 2 NEGATIVE NEGATIVE Final    Comment: (NOTE) SARS-CoV-2 target nucleic acids are NOT DETECTED.  The SARS-CoV-2 RNA is generally detectable in upper and lower respiratory specimens during the acute phase of infection. Negative results do not preclude SARS-CoV-2 infection, do not rule out co-infections with other pathogens, and should not be used as the sole basis for treatment or other patient management decisions. Negative results must be combined with clinical observations, patient history, and epidemiological information. The expected result is Negative.  Fact Sheet for Patients: hSugarRoll.be Fact Sheet for Healthcare Providers: hhttps://www.woods-mathews.com/ This test is not yet approved or cleared by the [place] and  has been authorized for detection and/or diagnosis of SARS-CoV-2 by FDA under an Emergency Use Authorization (EUA). This EUA will remain  in effect (meaning this test can be used) for the duration of the COVID-19 declaration under Se ction 564(b)(1) of the Act, 21 U.S.C. section 360bbb-3(b)(1), unless the authorization  is terminated or revoked sooner.  Performed at [facility] [id number], [place] [address]     Labs: Basic Metabolic Panel: Recent Labs  Lab 12/25/20 0014 12/26/20 0504 12/27/20 0048 12/28/20 0559 12/29/20 0504 12/30/20 0438  NA 138 140 140 140 141 140  K 3.6 3.7 4.0 4.2 4.1 3.7  CL 103 103 104 103 103 105  CO2 26 28 29 30 29 28   GLUCOSE 165* 118* 128* 70 120* 181*  BUN 21 25* 31* 31* 34* 32*  CREATININE 1.72* 1.69*  1.96* 1.63* 1.72* 1.59*  CALCIUM 8.6* 8.6* 8.7* 8.9 8.9 8.4*  MG 2.0  --   --   --   --   --    Liver Function Tests: Recent Labs  Lab 12/25/20 0014  AST 19  ALT 13  ALKPHOS 74  BILITOT 0.6  PROT 6.1*  ALBUMIN 3.1*   CBC: Recent Labs  Lab 12/24/20 2010 12/25/20 0014 12/26/20 0504 12/27/20 0048 12/28/20 0559 12/29/20 0504  WBC 7.4 7.3 5.4 6.9 6.8 7.4  NEUTROABS 5.5  --   --   --   --   --   HGB 8.5* 8.8* 7.9* 8.2* 8.7* 8.7*  HCT 27.1* 27.7* 25.0* 25.3* 27.8* 27.4*  MCV 86.0 84.5 86.2 84.9 86.3 85.1  PLT 167 178 148* 167 191 196   CBG: Recent Labs  Lab 12/29/20 0604 12/29/20 1100 12/29/20 1615 12/29/20 2126 12/30/20 0604  GLUCAP 103* 120* 176* 135* 140*   Hgb A1c No results for input(s): HGBA1C in the last 72 hours. Lipid Profile No results for input(s): CHOL, HDL, LDLCALC, TRIG, CHOLHDL, LDLDIRECT in the last 72 hours. Thyroid function studies No results for input(s): TSH, T4TOTAL, T3FREE, THYROIDAB in the last 72 hours.  Invalid input(s): FREET3 Urinalysis    Component Value Date/Time   COLORURINE YELLOW 06/30/2020 1407   APPEARANCEUR HAZY (A) 06/30/2020 1407   LABSPEC 1.018 06/30/2020 1407   PHURINE 5.0 06/30/2020 1407   GLUCOSEU 150 (A) 06/30/2020 1407   HGBUR NEGATIVE 06/30/2020 1407   BILIRUBINUR NEGATIVE 06/30/2020 1407   BILIRUBINUR neg 12/13/2014 1447   KETONESUR NEGATIVE 06/30/2020 1407   PROTEINUR 100 (A) 06/30/2020 1407   UROBILINOGEN 1.0 12/13/2014 1447   UROBILINOGEN 1.0 05/05/2014 0630    NITRITE NEGATIVE 06/30/2020 1407   LEUKOCYTESUR NEGATIVE 06/30/2020 1407    FURTHER DISCHARGE INSTRUCTIONS:   Get Medicines reviewed and adjusted: Please take all your medications with you for your next visit with your Primary MD   Laboratory/radiological data: Please request your Primary MD to go over all hospital tests and procedure/radiological results at the follow up, please ask your Primary MD to get all Hospital records sent to his/her office.   In some cases, they will be blood work, cultures and biopsy results pending at the time of your discharge. Please request that your primary care M.D. goes through all the records of your hospital data and follows up on these results.   Also Note the following: If you experience worsening of your admission symptoms, develop shortness of breath, life threatening emergency, suicidal or homicidal thoughts you must seek medical attention immediately by calling 911 or calling your MD immediately  if symptoms less severe.   You must read complete instructions/literature along with all the possible adverse reactions/side effects for all the Medicines you take and that have been prescribed to you. Take any new Medicines after you have completely understood and accpet all the possible adverse reactions/side effects.    Do not drive when taking Pain medications or sleeping medications (Benzodaizepines)   Do not take more than prescribed Pain, Sleep and Anxiety Medications. It is not advisable to combine anxiety,sleep and pain medications without talking with your primary care practitioner   Special Instructions: If you have smoked or chewed Tobacco  in the last 2 yrs please stop smoking, stop any regular Alcohol  and or any Recreational drug use.   Wear Seat belts while driving.   Please note: You were cared for by a hospitalist during your hospital stay. Once  you are discharged, your primary care physician will handle any further medical issues.  Please note that NO REFILLS for any discharge medications will be authorized once you are discharged, as it is imperative that you return to your primary care physician (or establish a relationship with a primary care physician if you do not have one) for your post hospital discharge needs so that they can reassess your need for medications and monitor your lab values.  Time coordinating discharge: 40 minutes  SIGNED:  Marzetta Board, MD, PhD 12/30/2020, 8:15 AM

## 2020-12-30 NOTE — Discharge Instructions (Signed)
CHF  Discharge instructions  DIET  Low sodium Heart Healthy Diet with fluid restriction 1500cc/day  ACTIVITY  Avoid strenuous activity  WEIGH DAILY AT SAME TIME . IF WEIGHT INCREASES BY MORE THAN 3  POUNDS IN 2 DAYS TAKE TORSEMIDE TWICE THAT DAY AND CALL YOUR DOCTOR   CALL YOUR DOCTOR OR COME TO EMERGENCY ROOM IF WORSENING SHORTNESS OF BREATH OR  CHEST PAIN OR SWELLING  F/U with PMD in 1 week to recheck labs and adjust fluid pills as needed  If you smoke cigarettes or use any tobacco products you are advised to stop. Please ask nurse for any written materials  Or additional information you want regarding smoking cessation   Follow with Ladell Pier, MD in 5-7 days  Please get a complete blood count and chemistry panel checked by your Primary MD at your next visit, and again as instructed by your Primary MD. Please get your medications reviewed and adjusted by your Primary MD.  Please request your Primary MD to go over all Hospital Tests and Procedure/Radiological results at the follow up, please get all Hospital records sent to your Prim MD by signing hospital release before you go home.  In some cases, there will be blood work, cultures and biopsy results pending at the time of your discharge. Please request that your primary care M.D. goes through all the records of your hospital data and follows up on these results.  If you had Pneumonia of Lung problems at the Hospital: Please get a 2 view Chest X ray done in 6-8 weeks after hospital discharge or sooner if instructed by your Primary MD.  If you have Congestive Heart Failure: Please call your Cardiologist or Primary MD anytime you have any of the following symptoms:  1) 3 pound weight gain in 24 hours or 5 pounds in 1 week  2) shortness of breath, with or without a dry hacking cough  3) swelling in the hands, feet or stomach  4) if you have to sleep on extra pillows at night in order to breathe  Follow cardiac low salt diet  and 1.5 lit/day fluid restriction.  If you have diabetes Accuchecks 4 times/day, Once in AM empty stomach and then before each meal. Log in all results and show them to your primary doctor at your next visit. If any glucose reading is under 80 or above 300 call your primary MD immediately.  If you have Seizure/Convulsions/Epilepsy: Please do not drive, operate heavy machinery, participate in activities at heights or participate in high speed sports until you have seen by Primary MD or a Neurologist and advised to do so again. Per [facility] statutes, patients with seizures are not allowed to drive until they have been seizure-free for six months.  Use caution when using heavy equipment or power tools. Avoid working on ladders or at heights. Take showers instead of baths. Ensure the water temperature is not too high on the home water heater. Do not go swimming alone. Do not lock yourself in a room alone (i.e. bathroom). When caring for infants or small children, sit down when holding, feeding, or changing them to minimize risk of injury to the child in the event you have a seizure. Maintain good sleep hygiene. Avoid alcohol.   If you had Gastrointestinal Bleeding: Please ask your Primary MD to check a complete blood count within one week of discharge or at your next visit. Your endoscopic/colonoscopic biopsies that are pending at the time of discharge, will  also need to followed by your Primary MD.  Get Medicines reviewed and adjusted. Please take all your medications with you for your next visit with your Primary MD  Please request your Primary MD to go over all hospital tests and procedure/radiological results at the follow up, please ask your Primary MD to get all Hospital records sent to his/her office.  If you experience worsening of your admission symptoms, develop shortness of breath, life threatening emergency, suicidal or homicidal thoughts you must seek medical attention  immediately by calling 911 or calling your MD immediately  if symptoms less severe.  You must read complete instructions/literature along with all the possible adverse reactions/side effects for all the Medicines you take and that have been prescribed to you. Take any new Medicines after you have completely understood and accpet all the possible adverse reactions/side effects.   Do not drive or operate heavy machinery when taking Pain medications.   Do not take more than prescribed Pain, Sleep and Anxiety Medications  Special Instructions: If you have smoked or chewed Tobacco  in the last 2 yrs please stop smoking, stop any regular Alcohol  and or any Recreational drug use.  Wear Seat belts while driving.  Please note You were cared for by a hospitalist during your hospital stay. If you have any questions about your discharge medications or the care you received while you were in the hospital after you are discharged, you can call the unit and asked to speak with the hospitalist on call if the hospitalist that took care of you is not available. Once you are discharged, your primary care physician will handle any further medical issues. Please note that NO REFILLS for any discharge medications will be authorized once you are discharged, as it is imperative that you return to your primary care physician (or establish a relationship with a primary care physician if you do not have one) for your aftercare needs so that they can reassess your need for medications and monitor your lab values.  You can reach the hospitalist office at phone [phone] or fax [phone]   If you do not have a primary care physician, you can call [phone] for a physician referral.  Activity: As tolerated with Full fall precautions use walker/cane & assistance as needed    Diet: low sodium  Disposition Home

## 2020-12-31 ENCOUNTER — Telehealth (HOSPITAL_COMMUNITY): Payer: Self-pay

## 2020-12-31 ENCOUNTER — Telehealth: Payer: Self-pay

## 2020-12-31 NOTE — Telephone Encounter (Signed)
Transition Care Management Unsuccessful Follow-up Telephone Call  Date of discharge and from where:  12/30/2020, [facility]   Attempts:  1st Attempt  Reason for unsuccessful TCM follow-up call:  Left voice message # [phone]  Need to schedule a hospital follow up appointment with PCP. His appointment his not until 02/15/2021.

## 2020-12-31 NOTE — Telephone Encounter (Signed)
Heart Failure Nurse Navigator Progress Note  Left message regarding post discharge care. Pt has PCP f/u 6/1. Checking how pt is organizing meds and navigating home s/p hospitalization. Left message with callback number to return call if any issues.  Pricilla Holm, RN, BSN Heart Failure Nurse Navigator [phone]

## 2021-01-01 ENCOUNTER — Telehealth: Payer: Self-pay

## 2021-01-01 NOTE — Telephone Encounter (Signed)
Transition Care Management Unsuccessful Follow-up Telephone Call  Date of discharge and from where:  12/30/2020, [facility]   Attempts:  2nd Attempt  Reason for unsuccessful TCM follow-up call:  Left voice message on # [phone].    Need to schedule a hospital follow up appointment with PCP. His appointment his not until 02/15/2021.

## 2021-01-02 ENCOUNTER — Telehealth: Payer: Self-pay

## 2021-01-02 ENCOUNTER — Other Ambulatory Visit: Payer: Self-pay | Admitting: Internal Medicine

## 2021-01-02 DIAGNOSIS — IMO0002 Reserved for concepts with insufficient information to code with codable children: Secondary | ICD-10-CM

## 2021-01-02 DIAGNOSIS — E1165 Type 2 diabetes mellitus with hyperglycemia: Secondary | ICD-10-CM

## 2021-01-02 NOTE — Telephone Encounter (Signed)
Transition Care Management Unsuccessful Follow-up Telephone Call  Date of discharge and from where:  12/30/2020, [facility]   Attempts:  3rd Attempt  Reason for unsuccessful TCM follow-up call:  Left voice message on # [phone].  Letter sent to patient requesting he call [place] to schedule a hospital follow up appointment.

## 2021-01-02 NOTE — Telephone Encounter (Signed)
Transition Care Management Follow-up Telephone Call  Patient returned phone call.   Date of discharge and from where: 12/30/2020, [facility]   How have you been since you were released from the hospital? He said he is feeling okay.   Any questions or concerns? No  Items Reviewed:  Did the pt receive and understand the discharge instructions provided? Yes   Medications obtained and verified? Yes -He said that he has all medications, including the new one,  and he manges them himself. He explained that he has an insulin pen and counts the clicks to know how much to administer.  He also said that he uses magnifying glasses to read the medication bottles. He did not have any questions about his med regime and he said he understands not to take the furosemide.   Other? No   Any new allergies since your discharge? No   Dietary orders reviewed? Yes - he also said that he is trying to adhere to the 1.5L/day fluid restriction.   Do you have support at home? Yes , his sister  [place] and Equipment/Supplies: Were home health services ordered? no If so, what is the name of the agency? n/a  Has the agency set up a time to come to the patient's home? not applicable Were any new equipment or medical supplies ordered?  No What is the name of the medical supply agency? n/a Were you able to get the supplies/equipment? not applicable Do you have any questions related to the use of the equipment or supplies? No   He just purchased a scale today.  Reviewed when to weigh himself daily and when to call provider with weight gain.  Also reminded him to keep a log of his weights.   He said that he needs a new glucometer.   Functional Questionnaire: (I = Independent and D = Dependent) ADLs: uses cane or RW with ambulation. He said he tends to use the cane indoors.  His sister helps with ADLs if needed.  She administers his eye drops.  Follow up appointments reviewed:   PCP Hospital f/u appt  confirmed? Yes  - Dr Wynetta Emery did not have an appointment available until the end of June, so he was agreeable to being scheduled at the [facility] - 01/07/2021 @ 1400 with [name], NP.  Provided him with the address and phone number for that clinic.   [facility] f/u appt confirmed? none scheduled at this time   Are transportation arrangements needed? No   If their condition worsens, is the pt aware to call PCP or go to the Emergency Dept.? Yes  Was the patient provided with contact information for the PCP's office or ED? Yes  Was to pt encouraged to call back with questions or concerns? Yes

## 2021-01-03 ENCOUNTER — Telehealth (INDEPENDENT_AMBULATORY_CARE_PROVIDER_SITE_OTHER): Payer: Self-pay | Admitting: Internal Medicine

## 2021-01-03 NOTE — Telephone Encounter (Signed)
Copied from [place] [phone]. Topic: General - Other >> Jan 03, 2021  1:13 PM Leward Quan A wrote: Reason for CRM: Patient called in asking to speak to Dr Wynetta Emery or her nurse say that the torsemide 40 MG TABS is having the opposite effect. Say that he is not urinating  Ph# [phone]

## 2021-01-03 NOTE — Telephone Encounter (Signed)
Will forward to provider  

## 2021-01-04 ENCOUNTER — Encounter (INDEPENDENT_AMBULATORY_CARE_PROVIDER_SITE_OTHER): Payer: Self-pay

## 2021-01-04 DIAGNOSIS — H4089 Other specified glaucoma: Secondary | ICD-10-CM | POA: Diagnosis not present

## 2021-01-04 DIAGNOSIS — H4052X3 Glaucoma secondary to other eye disorders, left eye, severe stage: Secondary | ICD-10-CM | POA: Diagnosis not present

## 2021-01-04 DIAGNOSIS — Z794 Long term (current) use of insulin: Secondary | ICD-10-CM | POA: Diagnosis not present

## 2021-01-04 DIAGNOSIS — Z961 Presence of intraocular lens: Secondary | ICD-10-CM | POA: Diagnosis not present

## 2021-01-04 DIAGNOSIS — E113553 Type 2 diabetes mellitus with stable proliferative diabetic retinopathy, bilateral: Secondary | ICD-10-CM | POA: Diagnosis not present

## 2021-01-07 ENCOUNTER — Ambulatory Visit: Payer: Medicare Other

## 2021-01-10 NOTE — Telephone Encounter (Signed)
PC returned to patient today.  Patient reports that the torsemide is now working well for him and is keeping the swelling in his legs down.  He will keep his scheduled follow-up appointment with me.

## 2021-01-14 ENCOUNTER — Other Ambulatory Visit: Payer: Self-pay | Admitting: Internal Medicine

## 2021-01-14 DIAGNOSIS — I10 Essential (primary) hypertension: Secondary | ICD-10-CM

## 2021-01-15 NOTE — Progress Notes (Unsigned)
S:     PCP: Dr. Wynetta Emery PMH: CHF, NSTEMI (2012), CAD, T2DM, HTN, HLD, CKD-3, anemia, BP PV, chronic diarrhea, glaucoma, legal blindness  Patient arrives in good spirits. Presents for diabetes evaluation, education, and management Patient was referred and last seen by Primary Care Provider on 11/16/20.   Pt recently admitted to [facility] from 5/9 to 12/27/20 due to worsening shortness of breath and edema. Pt newly diagnosed with CHF with an EF of 60-65% (unchanged from 9201) with diastolic dysfunction. Vital signs in the ED significant for SBP in the 140s-150s. Lab work-up showed BMP with creatinine 1.6 and glucose 174.BNP elevated to 44 with chest x-ray showing increased interstitial markings.At discharge, furosemide 20 mg twice daily was discontinued and torsemide 40 mg daily was initiated. All other medications were continued.   Additionally, documented on telephone encounter 01/02/21. Patient manages all medications. Has to count the clicks on insulin pen to know how much to administer. He also uses magnifying glasses to read the medication bottles as patient is legally blind.   Today, patient reports ***  BMET TODAY!  Compliance? Took meds this morning? When do you take your meds? Dizziness, headaches, blurred vision? History of swelling? Check Clinic BP? Home BP logs? If no logs, bring to next visit w/ BP cuff Go over BP goals Additional BP therapy if needed . Increase hydralazine to 100 mg daily . Clonidine  . Alpha blocker . eGFR 49, Scr 1.59 - limits ACE/ARB, thiazide, spiro . SGLT-2  Diet??  Exercise??    Insurance coverage/medication affordability: UHC Medicare and Medicaid  Medication adherence *** .  Current BP Medications include:  amlodipine 10 mg daily, torsemide 40 mg daily, hydralazine 50 mg TID  Antihypertensives tried in the past include: carvedilol (swelling and redness of calves)  Dietary habits include: *** Exercise habits  include:***  Family/Social History: -Social history: reports that he has never smoked. He has never used smokeless tobacco. He reports that he does not drink alcohol and does not use drugs. -Fhx: CAD in Father in the 34s, Kidney disease and DM in father, HF and HTN in Mother, DM in brother and sister   O:   Home BP readings: ***  Last 3 Office BP readings: BP Readings from Last 3 Encounters:  12/30/20 122/70  11/16/20 (!) 174/80  07/02/20 (!) 155/85    BMET    Component Value Date/Time   NA 140 12/30/2020 0438   NA 142 11/16/2020 1653   K 3.7 12/30/2020 0438   CL 105 12/30/2020 0438   CO2 28 12/30/2020 0438   GLUCOSE 181 (H) 12/30/2020 0438   BUN 32 (H) 12/30/2020 0438   BUN 24 11/16/2020 1653   CREATININE 1.59 (H) 12/30/2020 0438   CREATININE 0.80 09/02/2016 0902   CALCIUM 8.4 (L) 12/30/2020 0438   GFRNONAA 49 (L) 12/30/2020 0438   GFRNONAA >89 07/21/2016 0956   GFRAA 80 07/12/2019 1042   GFRAA >89 07/21/2016 0956    Renal function: CrCl cannot be calculated (Unknown ideal weight.).  Clinical ASCVD: Yes  The ASCVD Risk score Mikey Bussing DC Jr., et al., 2013) failed to calculate for the following reasons:   The valid total cholesterol range is 130 to 320 mg/dL   A/P: Hypertension diagnosed *** currently *** on current medications. BP Goal = < *** mmHg. Medication adherence ***.  -{Meds adjust:18428} ***.  -F/u labs ordered - *** -Counseled on lifestyle modifications for blood pressure control including reduced dietary sodium, increased exercise, adequate sleep.  Results reviewed and written information provided.   Total time in face-to-face counseling *** minutes.   F/U Clinic Visit in ***.    Lorel Monaco, PharmD, [place] PGY2 Ambulatory Care Resident [place]

## 2021-01-15 NOTE — Telephone Encounter (Signed)
   Notes to clinic:  medication requested is not on current medication list  Medication was d/c Review for continued use    Requested Prescriptions  Pending Prescriptions Disp Refills   hydrochlorothiazide (HYDRODIURIL) 12.5 MG tablet [Pharmacy Med Name: HYDROCHLOROTHIAZIDE 12.5 MG TB] 90 tablet 1    Sig: TAKE 1 TABLET BY MOUTH EVERY DAY      Cardiovascular: Diuretics - Thiazide Failed - 01/14/2021  1:24 PM      Failed - Ca in normal range and within 360 days    Calcium  Date Value Ref Range Status  12/30/2020 8.4 (L) 8.9 - 10.3 mg/dL Final   Calcium, Ion  Date Value Ref Range Status  05/05/2014 1.14 1.12 - 1.23 mmol/L Final          Failed - Cr in normal range and within 360 days    Creat  Date Value Ref Range Status  09/02/2016 0.80 0.70 - 1.33 mg/dL Final    Comment:      For patients > or = 63 years of age: The upper reference limit for Creatinine is approximately 13% higher for people identified as African-American.      Creatinine, Ser  Date Value Ref Range Status  12/30/2020 1.59 (H) 0.61 - 1.24 mg/dL Final   Creatinine, Urine  Date Value Ref Range Status  06/30/2020 97.16 mg/dL Final    Comment:    Performed at [facility], [place] [address], [place], [place] [address]          Passed - K in normal range and within 360 days    Potassium  Date Value Ref Range Status  12/30/2020 3.7 3.5 - 5.1 mmol/L Final          Passed - Na in normal range and within 360 days    Sodium  Date Value Ref Range Status  12/30/2020 140 135 - 145 mmol/L Final  11/16/2020 142 134 - 144 mmol/L Final          Passed - Last BP in normal range    BP Readings from Last 1 Encounters:  12/30/20 122/70          Passed - Valid encounter within last 6 months    Recent Outpatient Visits           2 months ago Type 2 diabetes mellitus with diabetic polyneuropathy, with long-term current use of insulin ([place])   [place] Ladell Pier, MD   6 months ago Hospital discharge follow-up   [place], Deborah B, MD   1 year ago Type 2 diabetes mellitus with diabetic polyneuropathy, with long-term current use of insulin ([place])   [place], MD   1 year ago DM type 2 with diabetic peripheral neuropathy [facility])   [place], [place], [place]   2 years ago DM type 2 with diabetic peripheral neuropathy [facility])   [place], [place], MD       Future Appointments             Tomorrow Daisy Blossom, Jarome Matin, [place]   In 1 month Wynetta Emery, Dalbert Batman, MD [place]

## 2021-01-16 ENCOUNTER — Ambulatory Visit: Payer: Medicare Other | Admitting: Pharmacist

## 2021-01-23 ENCOUNTER — Ambulatory Visit (INDEPENDENT_AMBULATORY_CARE_PROVIDER_SITE_OTHER): Payer: Medicare Other | Admitting: Ophthalmology

## 2021-01-23 ENCOUNTER — Encounter (INDEPENDENT_AMBULATORY_CARE_PROVIDER_SITE_OTHER): Payer: Self-pay | Admitting: Ophthalmology

## 2021-01-23 ENCOUNTER — Other Ambulatory Visit: Payer: Self-pay

## 2021-01-23 DIAGNOSIS — E113511 Type 2 diabetes mellitus with proliferative diabetic retinopathy with macular edema, right eye: Secondary | ICD-10-CM | POA: Diagnosis not present

## 2021-01-23 DIAGNOSIS — H4050X3 Glaucoma secondary to other eye disorders, unspecified eye, severe stage: Secondary | ICD-10-CM | POA: Diagnosis not present

## 2021-01-23 DIAGNOSIS — H2140 Pupillary membranes, unspecified eye: Secondary | ICD-10-CM | POA: Diagnosis not present

## 2021-01-23 DIAGNOSIS — H211X1 Other vascular disorders of iris and ciliary body, right eye: Secondary | ICD-10-CM

## 2021-01-23 MED ORDER — BEVACIZUMAB 2.5 MG/0.1ML IZ SOSY
2.5000 mg | PREFILLED_SYRINGE | INTRAVITREAL | Status: AC | PRN
  Administered 2021-01-23: 2.5 mg via INTRAVITREAL

## 2021-01-23 NOTE — Progress Notes (Signed)
01/23/2021     CHIEF COMPLAINT Patient presents for Retina Follow Up (7 Wk F/U OD, poss Avastin OD//Pt sts VA OD is "brighter" and he has to get closer to the computer to be able to see. Pt sts VA OD seems blurrier than last visit. OS stable.)   HISTORY OF PRESENT ILLNESS: Derek Lowery is a 65 y.o. male who presents to the clinic today for:   HPI    Retina Follow Up    Diagnosis: Diabetic Retinopathy   Laterality: right eye   Onset: 7 weeks ago   Severity: mild   Duration: 7 weeks   Course: gradually worsening   Comments: 7 Wk F/U OD, poss Avastin OD  Pt sts VA OD is "brighter" and he has to get closer to the computer to be able to see. Pt sts VA OD seems blurrier than last visit. OS stable.          Comments    Per patientSome increased blurred vision at this interval examination       Last edited by Hurman Horn, MD on 01/23/2021 10:35 AM. (History)      Referring physician: Ladell Pier, MD [place],  [place] [id number]  HISTORICAL INFORMATION:   Selected notes from the MEDICAL RECORD NUMBER    Lab Results  Component Value Date   HGBA1C 8.8 (A) 11/16/2020     CURRENT MEDICATIONS: Current Outpatient Medications (Ophthalmic Drugs)  Medication Sig  . brimonidine (ALPHAGAN) 0.2 % ophthalmic solution Place 1 drop into the right eye 2 (two) times daily.   . dorzolamide (TRUSOPT) 2 % ophthalmic solution Place 1 drop into the right eye 2 (two) times daily.  . RHOPRESSA 0.02 % SOLN Place 1 drop into the right eye at bedtime.  . timolol (BETIMOL) 0.5 % ophthalmic solution Place 1 drop into the right eye 2 (two) times daily.  Marland Kitchen latanoprost (XALATAN) 0.005 % ophthalmic solution Place 1 drop into the right eye at bedtime.   No current facility-administered medications for this visit. (Ophthalmic Drugs)   Current Outpatient Medications (Other)  Medication Sig  . amLODipine (NORVASC) 10 MG tablet Take 1 tablet (10 mg total) by mouth daily.  Marland Kitchen  ammonium lactate (AMLACTIN) 12 % lotion Apply to both feet twice daily for dry skin.  Marland Kitchen aspirin EC 81 MG tablet Take 1 tablet (81 mg total) by mouth daily.  Marland Kitchen atorvastatin (LIPITOR) 40 MG tablet TAKE 1 TABLET BY MOUTH EVERY DAY (Patient taking differently: Take 40 mg by mouth daily. TAKE 1 TABLET BY MOUTH EVERY DAY)  . BD PEN NEEDLE NANO 2ND GEN 32G X 4 MM MISC USE AS DIRECTED 3 TIMES A DAY. PLEASE SCHEDULE AN APPOINTMENT FOR ADDITIONAL REFILLS.  Marland Kitchen blood glucose meter kit and supplies KIT Dispense based on patient and insurance preference. Use up to four times daily as directed. One Touch Verio  . Continuous Blood Gluc Receiver (DEXCOM G6 RECEIVER) DEVI 1 Device by Does not apply route daily.  . Continuous Blood Gluc Receiver (DEXCOM G6 RECEIVER) DEVI 1 Device by Does not apply route daily.  . Continuous Blood Gluc Receiver (FREESTYLE LIBRE 14 DAY READER) DEVI USE AS DIRECTED  . Continuous Blood Gluc Sensor (DEXCOM G6 SENSOR) MISC 1 packet by Does not apply route daily.  . Continuous Blood Gluc Sensor (FREESTYLE LIBRE SENSOR SYSTEM) MISC Change sensor Q 2 wks  . Continuous Blood Gluc Transmit (DEXCOM G6 TRANSMITTER) MISC 1 packet by Does not apply  route daily.  . ferrous gluconate (FERGON) 324 MG tablet Take 1 tablet (324 mg total) by mouth daily with breakfast.  . glucose blood (ACCU-CHEK AVIVA PLUS) test strip Use as instructed for 3 times daily testing of blood sugar. E11.9  . glucose monitoring kit (FREESTYLE) monitoring kit 1 each by Does not apply route as needed for other.  . hydrALAZINE (APRESOLINE) 50 MG tablet Take 1 tablet (50 mg total) by mouth 3 (three) times daily.  . hydrocortisone (ANUSOL-HC) 2.5 % rectal cream Place 1 application rectally 2 (two) times daily as needed for hemorrhoids or anal itching (for flare of hemorrhoids).  . insulin glargine (LANTUS SOLOSTAR) 100 UNIT/ML Solostar Pen Inject 38 Units into the skin at bedtime.  . insulin lispro (HUMALOG KWIKPEN) 100 UNIT/ML  KwikPen Inject 10 Units into the skin 3 (three) times daily with meals.  . Lancets MISC Use as directed.  Accu chek 2  . Multiple Vitamins-Minerals (MULTIVITAMIN WITH MINERALS) tablet Take 1 tablet by mouth daily.  Marland Kitchen torsemide 40 MG TABS Take 40 mg by mouth daily.   No current facility-administered medications for this visit. (Other)      REVIEW OF SYSTEMS:    ALLERGIES Allergies  Allergen Reactions  . Coreg [Carvedilol] Other (See Comments)    Redness and swelling of calf    PAST MEDICAL HISTORY Past Medical History:  Diagnosis Date  . Arthritis   . CAD (coronary artery disease)    NSTEMI 06/2011:  LHC 07/18/11: Proximal diagonal 60%, distal LAD with a diabetic appearance and 60% stenosis, OM2 with an occluded superior branch and an inferior branch with 90%, EF 55% with inferior hypokinesis.  PCI: Promus DES to the OM2 inferior branch.  This vessel provides collaterals to the superior branch which remained occluded.  Echocardiogram 07/18/11: EF 60%, normal wall motion.  . Cataract   . Essential hypertension, benign   . Glaucoma   . Hypercholesteremia   . Internal hemorrhoids   . Noncompliance   . Type 2 diabetes mellitus ([place]) 1990   Past Surgical History:  Procedure Laterality Date  . CATARACT EXTRACTION Right 2019   Dr. Katy Fitch  . CHOLECYSTECTOMY N/A 09/21/2017   Procedure: LAPAROSCOPIC CHOLECYSTECTOMY WITH INTRAOPERATIVE CHOLANGIOGRAM;  Surgeon: Michael Boston, MD;  Location: [facility];  Service: General;  Laterality: N/A;  . COLONOSCOPY  2000   Dr. Collene Mares  . EYE SURGERY    . IRRIGATION AND DEBRIDEMENT ABSCESS Left 05/05/2014   Procedure: IRRIGATION AND DEBRIDEMENT ABSCESS left buttock;  Surgeon: Michael Boston, MD;  Location: [facility];  Service: General;  Laterality: Left;  . LEFT HEART CATHETERIZATION WITH CORONARY ANGIOGRAM N/A 07/18/2011   Procedure: LEFT HEART CATHETERIZATION WITH CORONARY ANGIOGRAM;  Surgeon: Hillary Bow, MD;  Location: [facility] CATH LAB;  Service:  Cardiovascular;  Laterality: N/A;  . PERCUTANEOUS CORONARY STENT INTERVENTION (PCI-S)  07/18/2011   Procedure: PERCUTANEOUS CORONARY STENT INTERVENTION (PCI-S);  Surgeon: Hillary Bow, MD;  Location: [facility] CATH LAB;  Service: Cardiovascular;;    FAMILY HISTORY Family History  Problem Relation Age of Onset  . Coronary artery disease Father        Developed in his 63s  . Kidney disease Father   . Diabetes Father   . Melanoma Father   . Rectal cancer Father   . Heart failure Mother   . Hypertension Mother   . Diabetes Sister   . Diabetes Brother   . Colon cancer Neg Hx   . Esophageal cancer Neg Hx   . Stomach  cancer Neg Hx     SOCIAL HISTORY Social History   Tobacco Use  . Smoking status: Never Smoker  . Smokeless tobacco: Never Used  Vaping Use  . Vaping Use: Never used  Substance Use Topics  . Alcohol use: No  . Drug use: No         OPHTHALMIC EXAM: Base Eye Exam    Visual Acuity (ETDRS)      Right Left   Dist [place] 20/40 -1 NLP   Dist ph [place] NI        Tonometry (Tonopen, 9:49 AM)      Right Left   Pressure 17 27       Pupils      Dark Light Shape React APD   Right 3 3 Irregular Minimal None   Left 3 3 Round Minimal +1       Visual Fields (Counting fingers)      Left Right   Restrictions Total superior temporal, inferior temporal, superior nasal, inferior nasal deficiencies Total inferior temporal deficiency       Extraocular Movement      Right Left    Full Full       Neuro/Psych    Oriented x3: Yes   Mood/Affect: Normal       Dilation    Right eye: 1.0% Mydriacyl, 2.5% Phenylephrine @ 9:49 AM        Slit Lamp and Fundus Exam    External Exam      Right Left   External Normal Normal       Slit Lamp Exam      Right Left   Lids/Lashes Normal Normal   Conjunctiva/Sclera White and quiet, no exposure of tube White and quiet   Cornea Clear Clear   Anterior Chamber ,, Tube 12 meridian Deep   Iris Old NVI large trunks, no signs of new   NVI Old neovascularization of the iris 360 degrees, stable nonprogressive   Lens Posterior chamber intraocular lens 3+ Nuclear sclerosis   Anterior Vitreous Normal        Fundus Exam      Right Left   Posterior Vitreous Posterior vitreous detachment    Disc Normal    C/D Ratio 0.7    Macula Microaneurysms,, Retinal pigment epithelial atrophy, Macular thickening, Mild clinically significant macular edema    Vessels Proliferative diabetic retinopathy, quiet    Periphery  good panretinal photocoagulation, some retina remains posteriorly that could receive PRP           IMAGING AND PROCEDURES  Imaging and Procedures for 01/23/21  OCT, Retina - OU - Both Eyes       Right Eye Quality was good. Scan locations included subfoveal. Central Foveal Thickness: 546. Progression has worsened. Findings include abnormal foveal contour.   Notes Chronic CSME, involved OD, significantly increased at 7-week follow-up interval baseline at 311 m, will need to repeat injection today and maintain 5 to 6-week interval OD       Intravitreal Injection, Pharmacologic Agent - OD - Right Eye       Time Out 01/23/2021. 10:33 AM. Confirmed correct patient, procedure, site, and patient consented.   Anesthesia Topical anesthesia was used. Anesthetic medications included [place] 3.5%.   Procedure Preparation included Tobramycin 0.3%, 10% betadine to eyelids, 5% betadine to ocular surface. A 30 gauge needle was used.   Injection:  2.5 mg Bevacizumab (AVASTIN) 2.25m/0.1mL SOSY   NDC: 738329-191-66 Lot:: [id number]  Route: Intravitreal, Site: Right Eye  Post-op Post injection exam found visual acuity of at least counting fingers. The patient tolerated the procedure well. There were no complications. The patient received written and verbal post procedure care education. Post injection medications were not given.                 ASSESSMENT/PLAN:  Diabetic macular edema of right eye with  proliferative retinopathy associated with type 2 diabetes mellitus (HCC) Severe CSME has recurred at 7-week follow-up interval for control of diabetic macular edema secondary to extreme retinal nonperfusion and ischemia.  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 Proliferative diabetic retinopathy of right eye with macular edema associated with type 2 diabetes mellitus (HCC)  E11.3511 OCT, Retina - OU - Both Eyes    Intravitreal Injection, Pharmacologic Agent - OD - Right Eye    bevacizumab (AVASTIN) SOSY 2.5 mg  2. Diabetic macular edema of right eye with proliferative retinopathy associated with type 2 diabetes mellitus ([place])  E11.3511   3. Glaucoma with pupillary seclusion, unspecified laterality, severe stage  H40.50X3    H21.40     1.  Increased CSME at 7-week follow-up interval.  Unsuccessful attempt at extending the interval of examination.  Will need to long-term maintain 5-week interval examination likely injection Avastin to control diabetic CSME as well as prevent growth and extension of iris rubeosis  2.  Repeat intravitreal Avastin OD today  3.  Ophthalmic Meds Ordered this visit:  Meds ordered this encounter  Medications  . bevacizumab (AVASTIN) SOSY 2.5 mg       Return in about 5 weeks (around 02/27/2021) for dilate, OD, AVASTIN OCT.  There are no Patient Instructions on file for this visit.   Explained the diagnoses, plan, and follow up with the patient and they expressed understanding.  Patient expressed understanding of the importance of proper follow up care.   Clent Demark Derrica Sieg M.D. Diseases & Surgery of the Retina and Vitreous Retina & Diabetic [place] 01/23/21     Abbreviations: M myopia (nearsighted); A astigmatism; H hyperopia (farsighted); P presbyopia; Mrx spectacle prescription;  CTL contact lenses; OD right  eye; OS left eye; OU both eyes  XT exotropia; ET esotropia; PEK punctate epithelial keratitis; PEE punctate epithelial erosions; DES dry eye syndrome; MGD meibomian gland dysfunction; ATs artificial tears; PFAT's preservative free artificial tears; [place] nuclear sclerotic cataract; PSC posterior subcapsular cataract; ERM epi-retinal membrane; PVD posterior vitreous detachment; RD retinal detachment; DM diabetes mellitus; DR diabetic retinopathy; NPDR non-proliferative diabetic retinopathy; PDR proliferative diabetic retinopathy; CSME clinically significant macular edema; DME diabetic macular edema; dbh dot blot hemorrhages; CWS cotton wool spot; POAG primary open angle glaucoma; C/D cup-to-disc ratio; HVF humphrey visual field; GVF goldmann visual field; OCT optical coherence tomography; IOP intraocular pressure; BRVO Branch retinal vein occlusion; CRVO central retinal vein occlusion; CRAO central retinal artery occlusion; BRAO branch retinal artery occlusion; RT retinal tear; SB scleral buckle; PPV pars plana vitrectomy; VH Vitreous hemorrhage; PRP panretinal laser photocoagulation; IVK intravitreal kenalog; VMT vitreomacular traction; MH Macular hole;  NVD neovascularization of the disc; NVE neovascularization elsewhere; AREDS age related eye disease study; ARMD age related macular degeneration; POAG primary open angle glaucoma; EBMD epithelial/anterior basement membrane dystrophy; ACIOL anterior chamber intraocular lens; IOL intraocular lens; PCIOL posterior chamber intraocular lens; Phaco/IOL phacoemulsification with intraocular lens placement; PRK photorefractive keratectomy; LASIK laser assisted in situ keratomileusis; HTN hypertension; DM  diabetes mellitus; COPD chronic obstructive pulmonary disease

## 2021-01-23 NOTE — Assessment & Plan Note (Signed)
Slightly more active today with 7-week interval will repeat injection today and maintain follow-up 5 to 6 weeks

## 2021-01-23 NOTE — Assessment & Plan Note (Signed)
Severe CSME has recurred at 7-week follow-up interval for control of diabetic macular edema secondary to extreme retinal nonperfusion and ischemia.

## 2021-01-23 NOTE — Assessment & Plan Note (Signed)
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

## 2021-01-26 ENCOUNTER — Other Ambulatory Visit: Payer: Self-pay | Admitting: Internal Medicine

## 2021-01-26 DIAGNOSIS — E785 Hyperlipidemia, unspecified: Secondary | ICD-10-CM

## 2021-01-30 ENCOUNTER — Other Ambulatory Visit: Payer: Self-pay | Admitting: Internal Medicine

## 2021-01-30 NOTE — Telephone Encounter (Signed)
    Notes to clinic: Patient has upcoming appt on 02/15/2021 Review for refills Medication filled by a different provider  Hydralazine is 3 times a day but only 60 tabs   Requested Prescriptions  Pending Prescriptions Disp Refills   Torsemide 40 MG TABS 30 tablet 1    Sig: Take 40 mg by mouth daily.      There is no refill protocol information for this order      hydrALAZINE (APRESOLINE) 50 MG tablet 60 tablet 2    Sig: Take 1 tablet (50 mg total) by mouth 3 (three) times daily.      Cardiovascular:  Vasodilators Failed - 01/30/2021  9:49 AM      Failed - HCT in normal range and within 360 days    HCT  Date Value Ref Range Status  12/29/2020 27.4 (L) 39.0 - 52.0 % Final   Hematocrit  Date Value Ref Range Status  11/16/2020 31.1 (L) 37.5 - 51.0 % Final          Failed - HGB in normal range and within 360 days    Hemoglobin  Date Value Ref Range Status  12/29/2020 8.7 (L) 13.0 - 17.0 g/dL Final  11/16/2020 10.3 (L) 13.0 - 17.7 g/dL Final          Failed - RBC in normal range and within 360 days    RBC  Date Value Ref Range Status  12/29/2020 3.22 (L) 4.22 - 5.81 MIL/uL Final          Passed - WBC in normal range and within 360 days    WBC  Date Value Ref Range Status  12/29/2020 7.4 4.0 - 10.5 K/uL Final          Passed - PLT in normal range and within 360 days    Platelets  Date Value Ref Range Status  12/29/2020 196 150 - 400 K/uL Final  11/16/2020 161 150 - 450 x10E3/uL Final          Passed - Last BP in normal range    BP Readings from Last 1 Encounters:  12/30/20 122/70          Passed - Valid encounter within last 12 months    Recent Outpatient Visits           2 months ago Type 2 diabetes mellitus with diabetic polyneuropathy, with long-term current use of insulin ([place])   [place], Deborah B, MD   6 months ago Hospital discharge follow-up   [place], Deborah B,  MD   1 year ago Type 2 diabetes mellitus with diabetic polyneuropathy, with long-term current use of insulin [facility])   [place], Deborah B, MD   1 year ago DM type 2 with diabetic peripheral neuropathy [facility])   [place], [place], [place]   2 years ago DM type 2 with diabetic peripheral neuropathy [facility])   [place], MD       Future Appointments             In 2 weeks Ladell Pier, MD [place]

## 2021-01-30 NOTE — Telephone Encounter (Signed)
Medication Refill - Medication: torsemide 40 MG TABS hydrALAZINE (APRESOLINE) 50 MG tablet   Has the patient contacted their pharmacy? Yes.   (Agent: If no, request that the patient contact the pharmacy for the refill.) (Agent: If yes, when and what did the pharmacy advise?)  Preferred Pharmacy (with phone number or street name):  [id number] [place] Phone: [phone]: [phone]   Agent: Please be advised that RX refills may take up to 3 business days. We ask that you follow-up with your pharmacy.

## 2021-01-31 MED ORDER — TORSEMIDE 40 MG PO TABS: 40.0000 mg | ORAL_TABLET | Freq: Every day | ORAL | 1 refills | Status: DC

## 2021-01-31 MED ORDER — HYDRALAZINE HCL 50 MG PO TABS: 50.0000 mg | ORAL_TABLET | Freq: Three times a day (TID) | ORAL | 2 refills | Status: DC

## 2021-02-15 ENCOUNTER — Ambulatory Visit: Payer: Medicare Other | Admitting: Internal Medicine

## 2021-02-19 ENCOUNTER — Ambulatory Visit: Payer: Medicare Other | Admitting: Family Medicine

## 2021-02-19 ENCOUNTER — Telehealth: Payer: Self-pay

## 2021-02-19 NOTE — Telephone Encounter (Signed)
Called pt because he was scheduled with our office today instead of his pcp no answer left vm cancelled appt

## 2021-02-25 ENCOUNTER — Ambulatory Visit: Payer: Medicare Other | Admitting: Podiatry

## 2021-02-27 ENCOUNTER — Encounter (INDEPENDENT_AMBULATORY_CARE_PROVIDER_SITE_OTHER): Payer: Self-pay | Admitting: Ophthalmology

## 2021-02-27 ENCOUNTER — Ambulatory Visit (INDEPENDENT_AMBULATORY_CARE_PROVIDER_SITE_OTHER): Payer: Medicare Other | Admitting: Ophthalmology

## 2021-02-27 ENCOUNTER — Other Ambulatory Visit: Payer: Self-pay

## 2021-02-27 DIAGNOSIS — E113511 Type 2 diabetes mellitus with proliferative diabetic retinopathy with macular edema, right eye: Secondary | ICD-10-CM | POA: Diagnosis not present

## 2021-02-27 DIAGNOSIS — H211X1 Other vascular disorders of iris and ciliary body, right eye: Secondary | ICD-10-CM

## 2021-02-27 MED ORDER — BEVACIZUMAB 2.5 MG/0.1ML IZ SOSY
2.5000 mg | PREFILLED_SYRINGE | INTRAVITREAL | Status: AC | PRN
  Administered 2021-02-27: 2.5 mg via INTRAVITREAL

## 2021-02-27 NOTE — Assessment & Plan Note (Signed)
Improved at 1 month post Avastin OD, we will repeat injection today and reevaluate again in 5 weeks

## 2021-02-27 NOTE — Assessment & Plan Note (Signed)
Chronic and recurrent and often aggressively recurrent if antivegF has been halted in the past

## 2021-02-27 NOTE — Progress Notes (Signed)
02/27/2021     CHIEF COMPLAINT Patient presents for Retina Follow Up (5 week fu OD and Avastin OD/Pt states VA OU stable since last visit. Pt denies FOL, floaters, or ocular pain OU. /A1C: 11.9 (06/2020)/LBS: Does not check/Reports using Latanoprost and Rhopressa QHS OD and Dorzolamide, Timolol BID OD/)   HISTORY OF PRESENT ILLNESS: Derek Lowery is a 65 y.o. male who presents to the clinic today for:   HPI     Retina Follow Up           Diagnosis: Diabetic Retinopathy   Laterality: right eye   Onset: 5 weeks ago   Severity: mild   Duration: 5 weeks   Course: stable   Comments: 5 week fu OD and Avastin OD Pt states VA OU stable since last visit. Pt denies FOL, floaters, or ocular pain OU.  A1C: 11.9 (06/2020) LBS: Does not check Reports using Latanoprost and Rhopressa QHS OD and Dorzolamide, Timolol BID OD          Comments   Slight improvement in acuity noted by patient over the last 1 month      Last edited by Hurman Horn, MD on 02/27/2021 10:26 AM.      Referring physician: Ladell Pier, MD [place],  [place] [id number]  HISTORICAL INFORMATION:   Selected notes from the MEDICAL RECORD NUMBER    Lab Results  Component Value Date   HGBA1C 8.8 (A) 11/16/2020     CURRENT MEDICATIONS: Current Outpatient Medications (Ophthalmic Drugs)  Medication Sig   brimonidine (ALPHAGAN) 0.2 % ophthalmic solution Place 1 drop into the right eye 2 (two) times daily.    dorzolamide (TRUSOPT) 2 % ophthalmic solution Place 1 drop into the right eye 2 (two) times daily.   latanoprost (XALATAN) 0.005 % ophthalmic solution Place 1 drop into the right eye at bedtime.   RHOPRESSA 0.02 % SOLN Place 1 drop into the right eye at bedtime.   timolol (BETIMOL) 0.5 % ophthalmic solution Place 1 drop into the right eye 2 (two) times daily.   No current facility-administered medications for this visit. (Ophthalmic Drugs)   Current Outpatient Medications (Other)   Medication Sig   amLODipine (NORVASC) 10 MG tablet Take 1 tablet (10 mg total) by mouth daily.   ammonium lactate (AMLACTIN) 12 % lotion Apply to both feet twice daily for dry skin.   aspirin EC 81 MG tablet Take 1 tablet (81 mg total) by mouth daily.   atorvastatin (LIPITOR) 40 MG tablet TAKE 1 TABLET BY MOUTH EVERY DAY   BD PEN NEEDLE NANO 2ND GEN 32G X 4 MM MISC USE AS DIRECTED 3 TIMES A DAY. PLEASE SCHEDULE AN APPOINTMENT FOR ADDITIONAL REFILLS.   blood glucose meter kit and supplies KIT Dispense based on patient and insurance preference. Use up to four times daily as directed. One Touch Verio   Continuous Blood Gluc Receiver (DEXCOM G6 RECEIVER) DEVI 1 Device by Does not apply route daily.   Continuous Blood Gluc Receiver (DEXCOM G6 RECEIVER) DEVI 1 Device by Does not apply route daily.   Continuous Blood Gluc Receiver (FREESTYLE LIBRE 14 DAY READER) DEVI USE AS DIRECTED   Continuous Blood Gluc Sensor (DEXCOM G6 SENSOR) MISC 1 packet by Does not apply route daily.   Continuous Blood Gluc Sensor (FREESTYLE LIBRE SENSOR SYSTEM) MISC Change sensor Q 2 wks   Continuous Blood Gluc Transmit (DEXCOM G6 TRANSMITTER) MISC 1 packet by Does not apply route daily.  ferrous gluconate (FERGON) 324 MG tablet Take 1 tablet (324 mg total) by mouth daily with breakfast.   glucose blood (ACCU-CHEK AVIVA PLUS) test strip Use as instructed for 3 times daily testing of blood sugar. E11.9   glucose monitoring kit (FREESTYLE) monitoring kit 1 each by Does not apply route as needed for other.   hydrALAZINE (APRESOLINE) 50 MG tablet Take 1 tablet (50 mg total) by mouth 3 (three) times daily.   hydrocortisone (ANUSOL-HC) 2.5 % rectal cream Place 1 application rectally 2 (two) times daily as needed for hemorrhoids or anal itching (for flare of hemorrhoids).   insulin glargine (LANTUS SOLOSTAR) 100 UNIT/ML Solostar Pen Inject 38 Units into the skin at bedtime.   insulin lispro (HUMALOG KWIKPEN) 100 UNIT/ML KwikPen  Inject 10 Units into the skin 3 (three) times daily with meals.   Lancets MISC Use as directed.  Accu chek 2   Multiple Vitamins-Minerals (MULTIVITAMIN WITH MINERALS) tablet Take 1 tablet by mouth daily.   Torsemide 40 MG TABS Take 40 mg by mouth daily.   No current facility-administered medications for this visit. (Other)      REVIEW OF SYSTEMS:    ALLERGIES Allergies  Allergen Reactions   Coreg [Carvedilol] Other (See Comments)    Redness and swelling of calf    PAST MEDICAL HISTORY Past Medical History:  Diagnosis Date   Arthritis    CAD (coronary artery disease)    NSTEMI 06/2011:  LHC 07/18/11: Proximal diagonal 60%, distal LAD with a diabetic appearance and 60% stenosis, OM2 with an occluded superior branch and an inferior branch with 90%, EF 55% with inferior hypokinesis.  PCI: Promus DES to the OM2 inferior branch.  This vessel provides collaterals to the superior branch which remained occluded.  Echocardiogram 07/18/11: EF 60%, normal wall motion.   Cataract    Essential hypertension, benign    Glaucoma    Hypercholesteremia    Internal hemorrhoids    Noncompliance    Type 2 diabetes mellitus ([place]) 1990   Past Surgical History:  Procedure Laterality Date   CATARACT EXTRACTION Right 2019   Dr. Katy Fitch   CHOLECYSTECTOMY N/A 09/21/2017   Procedure: LAPAROSCOPIC CHOLECYSTECTOMY WITH INTRAOPERATIVE CHOLANGIOGRAM;  Surgeon: Michael Boston, MD;  Location: [facility];  Service: General;  Laterality: N/A;   COLONOSCOPY  2000   Dr. Collene Mares   EYE SURGERY     IRRIGATION AND DEBRIDEMENT ABSCESS Left 05/05/2014   Procedure: IRRIGATION AND DEBRIDEMENT ABSCESS left buttock;  Surgeon: Michael Boston, MD;  Location: [facility];  Service: General;  Laterality: Left;   LEFT HEART CATHETERIZATION WITH CORONARY ANGIOGRAM N/A 07/18/2011   Procedure: LEFT HEART CATHETERIZATION WITH CORONARY ANGIOGRAM;  Surgeon: Hillary Bow, MD;  Location: [facility] CATH LAB;  Service: Cardiovascular;  Laterality: N/A;    PERCUTANEOUS CORONARY STENT INTERVENTION (PCI-S)  07/18/2011   Procedure: PERCUTANEOUS CORONARY STENT INTERVENTION (PCI-S);  Surgeon: Hillary Bow, MD;  Location: [facility] CATH LAB;  Service: Cardiovascular;;    FAMILY HISTORY Family History  Problem Relation Age of Onset   Coronary artery disease Father        Developed in his 95s   Kidney disease Father    Diabetes Father    Melanoma Father    Rectal cancer Father    Heart failure Mother    Hypertension Mother    Diabetes Sister    Diabetes Brother    Colon cancer Neg Hx    Esophageal cancer Neg Hx    Stomach cancer Neg Hx  SOCIAL HISTORY Social History   Tobacco Use   Smoking status: Never   Smokeless tobacco: Never  Vaping Use   Vaping Use: Never used  Substance Use Topics   Alcohol use: No   Drug use: No         OPHTHALMIC EXAM:  Base Eye Exam     Visual Acuity (ETDRS)       Right Left   Dist [place] 20/30 -1 NLP   Dist ph  NI          Tonometry (Tonopen, 10:04 AM)       Right Left   Pressure 16 30         Tonometry #2 (Tonopen, 10:04 AM)       Right Left   Pressure  32         Pupils       Dark Light Shape React APD   Right 3 3 Irregular Minimal None   Left 3 3 Round Minimal +1         Visual Fields       Left Right   Restrictions Total superior temporal, inferior temporal, superior nasal, inferior nasal deficiencies Total inferior temporal deficiency         Neuro/Psych     Oriented x3: Yes   Mood/Affect: Normal         Dilation     Right eye: 1.0% Mydriacyl, 2.5% Phenylephrine @ 10:04 AM           Slit Lamp and Fundus Exam     External Exam       Right Left   External Normal Normal         Slit Lamp Exam       Right Left   Lids/Lashes Normal Normal   Conjunctiva/Sclera White and quiet, no exposure of tube White and quiet   Cornea Clear Clear   Anterior Chamber ,, Tube 12 meridian Deep   Iris Old NVI large trunks, no signs of new  NVI Old  neovascularization of the iris 360 degrees, stable nonprogressive   Lens Posterior chamber intraocular lens 3+ Nuclear sclerosis   Anterior Vitreous Normal          Fundus Exam       Right Left   Posterior Vitreous Posterior vitreous detachment    Disc Normal    C/D Ratio 0.7    Macula Microaneurysms,, Retinal pigment epithelial atrophy, Macular thickening, Mild clinically significant macular edema    Vessels Proliferative diabetic retinopathy, quiet    Periphery  good panretinal photocoagulation, some retina remains posteriorly that could receive PRP             IMAGING AND PROCEDURES  Imaging and Procedures for 02/27/21  OCT, Retina - OU - Both Eyes       Right Eye Quality was good. Scan locations included subfoveal. Central Foveal Thickness: 284. Progression has worsened.   Notes Chronic CSME, vastly improved now some 5 weeks post recent Avastin.  Center thickness of the fovea OD decreased from 546 m to now up to 20 m.     Intravitreal Injection, Pharmacologic Agent - OD - Right Eye       Time Out 02/27/2021. 10:28 AM. Confirmed correct patient, procedure, site, and patient consented.   Anesthesia Topical anesthesia was used. Anesthetic medications included Ak[place] 3.5%.   Procedure Preparation included Tobramycin 0.3%, 10% betadine to eyelids, 5% betadine to ocular surface. A 30 gauge needle was used.   Injection: 2.5  mg bevacizumab 2.5 MG/0.1ML   Route: Intravitreal, Site: Right Eye   NDC: [phone], Lot: [id number]   Post-op Post injection exam found visual acuity of at least counting fingers. The patient tolerated the procedure well. There were no complications. The patient received written and verbal post procedure care education. Post injection medications were not given.              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 Proliferative diabetic retinopathy of right eye with macular edema associated with type 2 diabetes mellitus (HCC)  E11.3511 OCT, Retina - OU - Both Eyes    Intravitreal Injection, Pharmacologic Agent - OD - Right Eye    bevacizumab (AVASTIN) SOSY 2.5 mg    2. Diabetic macular edema of right eye with proliferative retinopathy associated with type 2 diabetes mellitus ([place])  E11.3511     3. Rubeosis iridis of right eye  H21.1X1       1.  Vastly improved CSME centrally OD with improved acuity as well as less active rubeosis OD post Avastin at 5 weeks.  2.  Repeat Avastin OD today and this monocular patient and examination next OD in 5 weeks  3.  Ophthalmic Meds Ordered this visit:  Meds ordered this encounter  Medications   bevacizumab (AVASTIN) SOSY 2.5 mg       Return in about 5 weeks (around 04/03/2021) for dilate, OD, AVASTIN OCT.  There are no Patient Instructions on file for this visit.   Explained the diagnoses, plan, and follow up with the patient and they expressed understanding.  Patient expressed understanding of the importance of proper follow up care.   Clent Demark Rankin M.D. Diseases & Surgery of the Retina and Vitreous Retina & Diabetic [place] 02/27/21     Abbreviations: M myopia (nearsighted); A astigmatism; H hyperopia (farsighted); P presbyopia; Mrx spectacle prescription;  CTL contact lenses; OD right eye; OS left eye; OU both eyes  XT exotropia; ET esotropia; PEK punctate epithelial keratitis; PEE punctate epithelial erosions; DES dry eye syndrome; MGD meibomian gland dysfunction; ATs artificial tears; PFAT's preservative free artificial tears; [place] nuclear sclerotic cataract; PSC posterior subcapsular cataract; ERM epi-retinal membrane; PVD  posterior vitreous detachment; RD retinal detachment; DM diabetes mellitus; DR diabetic retinopathy; NPDR non-proliferative diabetic retinopathy; PDR proliferative diabetic retinopathy; CSME clinically significant macular edema; DME diabetic macular edema; dbh dot blot hemorrhages; CWS cotton wool spot; POAG primary open angle glaucoma; C/D cup-to-disc ratio; HVF humphrey visual field; GVF goldmann visual field; OCT optical coherence tomography; IOP intraocular pressure; BRVO Branch retinal vein occlusion; CRVO central retinal vein occlusion; CRAO central retinal artery occlusion; BRAO branch retinal artery occlusion; RT retinal tear; SB scleral buckle; PPV pars plana vitrectomy; VH Vitreous hemorrhage; PRP panretinal laser photocoagulation; IVK intravitreal kenalog; VMT vitreomacular traction; MH Macular hole;  NVD neovascularization of the disc; NVE neovascularization elsewhere; AREDS age related eye disease study; ARMD age related macular degeneration; POAG primary open angle glaucoma; EBMD epithelial/anterior basement membrane dystrophy; ACIOL anterior chamber intraocular lens; IOL intraocular lens; PCIOL posterior chamber intraocular lens; Phaco/IOL phacoemulsification with intraocular lens placement; PRK photorefractive keratectomy; LASIK laser assisted in situ keratomileusis; HTN hypertension; DM  diabetes mellitus; COPD chronic obstructive pulmonary disease

## 2021-03-17 ENCOUNTER — Other Ambulatory Visit: Payer: Self-pay | Admitting: Internal Medicine

## 2021-03-17 DIAGNOSIS — E785 Hyperlipidemia, unspecified: Secondary | ICD-10-CM

## 2021-03-17 DIAGNOSIS — I10 Essential (primary) hypertension: Secondary | ICD-10-CM

## 2021-03-17 NOTE — Telephone Encounter (Signed)
Requested Prescriptions  Pending Prescriptions Disp Refills  . atorvastatin (LIPITOR) 40 MG tablet [Pharmacy Med Name: ATORVASTATIN 40 MG TABLET] 90 tablet 1    Sig: TAKE 1 TABLET BY MOUTH EVERY DAY     Cardiovascular:  Antilipid - Statins Failed - 03/17/2021 10:04 AM      Failed - HDL in normal range and within 360 days    HDL  Date Value Ref Range Status  11/16/2020 36 (L) >39 mg/dL Final         Passed - Total Cholesterol in normal range and within 360 days    Cholesterol, Total  Date Value Ref Range Status  11/16/2020 124 100 - 199 mg/dL Final         Passed - LDL in normal range and within 360 days    LDL Chol Calc (NIH)  Date Value Ref Range Status  11/16/2020 63 0 - 99 mg/dL Final         Passed - Triglycerides in normal range and within 360 days    Triglycerides  Date Value Ref Range Status  11/16/2020 145 0 - 149 mg/dL Final         Passed - Patient is not pregnant      Passed - Valid encounter within last 12 months    Recent Outpatient Visits          4 months ago Type 2 diabetes mellitus with diabetic polyneuropathy, with long-term current use of insulin ([place])   [place] Ladell Pier, MD   8 months ago Hospital discharge follow-up   [place], Deborah B, MD   1 year ago Type 2 diabetes mellitus with diabetic polyneuropathy, with long-term current use of insulin [facility])   [place], Deborah B, MD   1 year ago DM type 2 with diabetic peripheral neuropathy [facility])   [place] [facility], [place], [place]   2 years ago DM type 2 with diabetic peripheral neuropathy [facility])   [place], Deborah B, MD      Future Appointments            In 4 days Ladell Pier, MD [place]           . hydrochlorothiazide (HYDRODIURIL) 12.5 MG tablet [Pharmacy  Med Name: HYDROCHLOROTHIAZIDE 12.5 MG TB] 90 tablet     Sig: TAKE 1 TABLET BY MOUTH EVERY DAY     Cardiovascular: Diuretics - Thiazide Failed - 03/17/2021 10:04 AM      Failed - Ca in normal range and within 360 days    Calcium  Date Value Ref Range Status  12/30/2020 8.4 (L) 8.9 - 10.3 mg/dL Final   Calcium, Ion  Date Value Ref Range Status  05/05/2014 1.14 1.12 - 1.23 mmol/L Final         Failed - Cr in normal range and within 360 days    Creat  Date Value Ref Range Status  09/02/2016 0.80 0.70 - 1.33 mg/dL Final    Comment:      For patients > or = 63 years of age: The upper reference limit for Creatinine is approximately 13% higher for people identified as African-American.      Creatinine, Ser  Date Value Ref Range Status  12/30/2020 1.59 (H) 0.61 - 1.24 mg/dL Final   Creatinine, Urine  Date Value Ref Range  Status  06/30/2020 97.16 mg/dL Final    Comment:    Performed at [facility], [place] [address], [place], [place] [address]         Passed - K in normal range and within 360 days    Potassium  Date Value Ref Range Status  12/30/2020 3.7 3.5 - 5.1 mmol/L Final         Passed - Na in normal range and within 360 days    Sodium  Date Value Ref Range Status  12/30/2020 140 135 - 145 mmol/L Final  11/16/2020 142 134 - 144 mmol/L Final         Passed - Last BP in normal range    BP Readings from Last 1 Encounters:  12/30/20 122/70         Passed - Valid encounter within last 6 months    Recent Outpatient Visits          4 months ago Type 2 diabetes mellitus with diabetic polyneuropathy, with long-term current use of insulin ([place])   [place] Ladell Pier, MD   8 months ago Hospital discharge follow-up   [place], Deborah B, MD   1 year ago Type 2 diabetes mellitus with diabetic polyneuropathy, with long-term current use of insulin [facility])   [place], [place]   1 year ago DM type 2 with diabetic peripheral neuropathy [facility])   [place], [place], [place]   2 years ago DM type 2 with diabetic peripheral neuropathy [facility])   [place], [place]      Future Appointments            In 4 days Ladell Pier, MD [place]

## 2021-03-17 NOTE — Telephone Encounter (Signed)
last RF 07/02/20 Stop taking at discharge Dr. Candiss Norse

## 2021-03-21 ENCOUNTER — Ambulatory Visit: Payer: Medicare Other | Admitting: Internal Medicine

## 2021-03-25 ENCOUNTER — Other Ambulatory Visit: Payer: Self-pay | Admitting: Internal Medicine

## 2021-03-25 NOTE — Telephone Encounter (Signed)
Requested Prescriptions  Pending Prescriptions Disp Refills  . torsemide (DEMADEX) 20 MG tablet [Pharmacy Med Name: TORSEMIDE 20 MG TABLET] 60 tablet 1    Sig: TAKE 2 TABLETS (40 MG TOTAL) BY MOUTH EVERY DAY     Cardiovascular:  Diuretics - Loop Failed - 03/25/2021 12:30 PM      Failed - Ca in normal range and within 360 days    Calcium  Date Value Ref Range Status  12/30/2020 8.4 (L) 8.9 - 10.3 mg/dL Final   Calcium, Ion  Date Value Ref Range Status  05/05/2014 1.14 1.12 - 1.23 mmol/L Final         Failed - Cr in normal range and within 360 days    Creat  Date Value Ref Range Status  09/02/2016 0.80 0.70 - 1.33 mg/dL Final    Comment:      For patients > or = 63 years of age: The upper reference limit for Creatinine is approximately 13% higher for people identified as African-American.      Creatinine, Ser  Date Value Ref Range Status  12/30/2020 1.59 (H) 0.61 - 1.24 mg/dL Final   Creatinine, Urine  Date Value Ref Range Status  06/30/2020 97.16 mg/dL Final    Comment:    Performed at [facility], [place] [address] [place], [place] [address]         Passed - K in normal range and within 360 days    Potassium  Date Value Ref Range Status  12/30/2020 3.7 3.5 - 5.1 mmol/L Final         Passed - Na in normal range and within 360 days    Sodium  Date Value Ref Range Status  12/30/2020 140 135 - 145 mmol/L Final  11/16/2020 142 134 - 144 mmol/L Final         Passed - Last BP in normal range    BP Readings from Last 1 Encounters:  12/30/20 122/70         Passed - Valid encounter within last 6 months    Recent Outpatient Visits          4 months ago Type 2 diabetes mellitus with diabetic polyneuropathy, with long-term current use of insulin ([place])   [place] Ladell Pier, MD   8 months ago Hospital discharge follow-up   [place], Deborah B, MD   1 year ago Type 2 diabetes  mellitus with diabetic polyneuropathy, with long-term current use of insulin [facility])   [place], MD   1 year ago DM type 2 with diabetic peripheral neuropathy [facility])   [place], [place], [place]   2 years ago DM type 2 with diabetic peripheral neuropathy [facility])   [facility] Ladell Pier, MD

## 2021-04-03 ENCOUNTER — Encounter (INDEPENDENT_AMBULATORY_CARE_PROVIDER_SITE_OTHER): Payer: Self-pay | Admitting: Ophthalmology

## 2021-04-03 ENCOUNTER — Encounter (INDEPENDENT_AMBULATORY_CARE_PROVIDER_SITE_OTHER): Payer: Medicare Other | Admitting: Ophthalmology

## 2021-04-03 ENCOUNTER — Ambulatory Visit (INDEPENDENT_AMBULATORY_CARE_PROVIDER_SITE_OTHER): Payer: Medicare Other | Admitting: Ophthalmology

## 2021-04-03 ENCOUNTER — Other Ambulatory Visit: Payer: Self-pay

## 2021-04-03 DIAGNOSIS — E113511 Type 2 diabetes mellitus with proliferative diabetic retinopathy with macular edema, right eye: Secondary | ICD-10-CM | POA: Diagnosis not present

## 2021-04-03 MED ORDER — BEVACIZUMAB 2.5 MG/0.1ML IZ SOSY
2.5000 mg | PREFILLED_SYRINGE | INTRAVITREAL | Status: AC | PRN
  Administered 2021-04-03: 2.5 mg via INTRAVITREAL

## 2021-04-03 NOTE — Progress Notes (Signed)
04/03/2021     CHIEF COMPLAINT Patient presents for Retina Follow Up   HISTORY OF PRESENT ILLNESS: Derek Lowery is a 65 y.o. male who presents to the clinic today for:   HPI     Retina Follow Up           Diagnosis: Diabetic Retinopathy   Laterality: right eye   Onset: 5 weeks ago   Severity: mild   Duration: 5 weeks   Course: stable         Comments   5 week fu OD and Avastin OD Pt states VA OU stable since last visit. Pt denies FOL, floaters, or ocular pain OU.  A1C: Unknown LBS: Does not check Pt reports using "5 different eye drops a day but I cannot tell you when or what they are."       Last edited by Kendra Opitz, COA on 04/03/2021  9:39 AM.      Referring physician: Ladell Pier, MD [place],  [place] [phone]  HISTORICAL INFORMATION:   Selected notes from the MEDICAL RECORD NUMBER    Lab Results  Component Value Date   HGBA1C 8.8 (A) 11/16/2020     CURRENT MEDICATIONS: Current Outpatient Medications (Ophthalmic Drugs)  Medication Sig   brimonidine (ALPHAGAN) 0.2 % ophthalmic solution Place 1 drop into the right eye 2 (two) times daily.    dorzolamide (TRUSOPT) 2 % ophthalmic solution Place 1 drop into the right eye 2 (two) times daily.   latanoprost (XALATAN) 0.005 % ophthalmic solution Place 1 drop into the right eye at bedtime.   RHOPRESSA 0.02 % SOLN Place 1 drop into the right eye at bedtime.   timolol (BETIMOL) 0.5 % ophthalmic solution Place 1 drop into the right eye 2 (two) times daily.   No current facility-administered medications for this visit. (Ophthalmic Drugs)   Current Outpatient Medications (Other)  Medication Sig   amLODipine (NORVASC) 10 MG tablet Take 1 tablet (10 mg total) by mouth daily.   ammonium lactate (AMLACTIN) 12 % lotion Apply to both feet twice daily for dry skin.   aspirin EC 81 MG tablet Take 1 tablet (81 mg total) by mouth daily.   atorvastatin (LIPITOR) 40 MG tablet TAKE 1 TABLET  BY MOUTH EVERY DAY   BD PEN NEEDLE NANO 2ND GEN 32G X 4 MM MISC USE AS DIRECTED 3 TIMES A DAY. PLEASE SCHEDULE AN APPOINTMENT FOR ADDITIONAL REFILLS.   blood glucose meter kit and supplies KIT Dispense based on patient and insurance preference. Use up to four times daily as directed. One Touch Verio   Continuous Blood Gluc Receiver (DEXCOM G6 RECEIVER) DEVI 1 Device by Does not apply route daily.   Continuous Blood Gluc Receiver (DEXCOM G6 RECEIVER) DEVI 1 Device by Does not apply route daily.   Continuous Blood Gluc Receiver (FREESTYLE LIBRE 14 DAY READER) DEVI USE AS DIRECTED   Continuous Blood Gluc Sensor (DEXCOM G6 SENSOR) MISC 1 packet by Does not apply route daily.   Continuous Blood Gluc Sensor (FREESTYLE LIBRE SENSOR SYSTEM) MISC Change sensor Q 2 wks   Continuous Blood Gluc Transmit (DEXCOM G6 TRANSMITTER) MISC 1 packet by Does not apply route daily.   ferrous gluconate (FERGON) 324 MG tablet Take 1 tablet (324 mg total) by mouth daily with breakfast.   glucose blood (ACCU-CHEK AVIVA PLUS) test strip Use as instructed for 3 times daily testing of blood sugar. E11.9   glucose monitoring kit (FREESTYLE) monitoring kit  1 each by Does not apply route as needed for other.   hydrALAZINE (APRESOLINE) 50 MG tablet Take 1 tablet (50 mg total) by mouth 3 (three) times daily.   hydrocortisone (ANUSOL-HC) 2.5 % rectal cream Place 1 application rectally 2 (two) times daily as needed for hemorrhoids or anal itching (for flare of hemorrhoids).   insulin glargine (LANTUS SOLOSTAR) 100 UNIT/ML Solostar Pen Inject 38 Units into the skin at bedtime.   insulin lispro (HUMALOG KWIKPEN) 100 UNIT/ML KwikPen Inject 10 Units into the skin 3 (three) times daily with meals.   Lancets MISC Use as directed.  Accu chek 2   Multiple Vitamins-Minerals (MULTIVITAMIN WITH MINERALS) tablet Take 1 tablet by mouth daily.   torsemide (DEMADEX) 20 MG tablet TAKE 2 TABLETS (40 MG TOTAL) BY MOUTH EVERY DAY   No current  facility-administered medications for this visit. (Other)      REVIEW OF SYSTEMS:    ALLERGIES Allergies  Allergen Reactions   Coreg [Carvedilol] Other (See Comments)    Redness and swelling of calf    PAST MEDICAL HISTORY Past Medical History:  Diagnosis Date   Arthritis    CAD (coronary artery disease)    NSTEMI 06/2011:  LHC 07/18/11: Proximal diagonal 60%, distal LAD with a diabetic appearance and 60% stenosis, OM2 with an occluded superior branch and an inferior branch with 90%, EF 55% with inferior hypokinesis.  PCI: Promus DES to the OM2 inferior branch.  This vessel provides collaterals to the superior branch which remained occluded.  Echocardiogram 07/18/11: EF 60%, normal wall motion.   Cataract    Essential hypertension, benign    Glaucoma    Hypercholesteremia    Internal hemorrhoids    Noncompliance    Type 2 diabetes mellitus ([place]) 1990   Past Surgical History:  Procedure Laterality Date   CATARACT EXTRACTION Right 2019   Dr. Katy Fitch   CHOLECYSTECTOMY N/A 09/21/2017   Procedure: LAPAROSCOPIC CHOLECYSTECTOMY WITH INTRAOPERATIVE CHOLANGIOGRAM;  Surgeon: Michael Boston, MD;  Location: [facility];  Service: General;  Laterality: N/A;   COLONOSCOPY  2000   Dr. Collene Mares   EYE SURGERY     IRRIGATION AND DEBRIDEMENT ABSCESS Left 05/05/2014   Procedure: IRRIGATION AND DEBRIDEMENT ABSCESS left buttock;  Surgeon: Michael Boston, MD;  Location: [facility];  Service: General;  Laterality: Left;   LEFT HEART CATHETERIZATION WITH CORONARY ANGIOGRAM N/A 07/18/2011   Procedure: LEFT HEART CATHETERIZATION WITH CORONARY ANGIOGRAM;  Surgeon: Hillary Bow, MD;  Location: [facility] CATH LAB;  Service: Cardiovascular;  Laterality: N/A;   PERCUTANEOUS CORONARY STENT INTERVENTION (PCI-S)  07/18/2011   Procedure: PERCUTANEOUS CORONARY STENT INTERVENTION (PCI-S);  Surgeon: Hillary Bow, MD;  Location: [facility] CATH LAB;  Service: Cardiovascular;;    FAMILY HISTORY Family History  Problem Relation Age of  Onset   Coronary artery disease Father        Developed in his 81s   Kidney disease Father    Diabetes Father    Melanoma Father    Rectal cancer Father    Heart failure Mother    Hypertension Mother    Diabetes Sister    Diabetes Brother    Colon cancer Neg Hx    Esophageal cancer Neg Hx    Stomach cancer Neg Hx     SOCIAL HISTORY Social History   Tobacco Use   Smoking status: Never   Smokeless tobacco: Never  Vaping Use   Vaping Use: Never used  Substance Use Topics   Alcohol use: No  Drug use: No         OPHTHALMIC EXAM:  Base Eye Exam     Visual Acuity (ETDRS)       Right Left   Dist [place] 20/30 -1 NLP   Dist ph [place] NI          Tonometry (Tonopen, 9:41 AM)       Right Left   Pressure 15 31         Pupils       Dark Light Shape React APD   Right 3 3 Irregular Minimal None   Left 3 3 Round Minimal +1         Visual Fields       Left Right   Restrictions Total superior temporal, inferior temporal, superior nasal, inferior nasal deficiencies Total inferior temporal deficiency; Partial outer inferior nasal deficiency         Neuro/Psych     Oriented x3: Yes   Mood/Affect: Normal         Dilation     Right eye: 1.0% Mydriacyl, 2.5% Phenylephrine @ 9:41 AM           Slit Lamp and Fundus Exam     External Exam       Right Left   External Normal Normal         Slit Lamp Exam       Right Left   Lids/Lashes Normal Normal   Conjunctiva/Sclera White and quiet, no exposure of tube White and quiet   Cornea Clear Clear   Anterior Chamber ,, Tube 12 meridian Deep   Iris Old NVI large trunks, no signs of new  NVI Old neovascularization of the iris 360 degrees, stable nonprogressive   Lens Posterior chamber intraocular lens 3+ Nuclear sclerosis   Anterior Vitreous Normal          Fundus Exam       Right Left   Posterior Vitreous Posterior vitreous detachment    Disc Normal    C/D Ratio 0.7    Macula Microaneurysms,,  Retinal pigment epithelial atrophy, Macular thickening, no clinically significant macular edema    Vessels Proliferative diabetic retinopathy, quiet    Periphery  good panretinal photocoagulation, some retina remains posteriorly that could receive PRP             IMAGING AND PROCEDURES  Imaging and Procedures for 04/03/21  OCT, Retina - OU - Both Eyes       Right Eye Quality was good. Scan locations included subfoveal. Central Foveal Thickness: 282. Progression has been stable. Findings include cystoid macular edema.   Notes Chronic CSME, vastly improved now some 5 weeks post recent Avastin.  Center thickness of the fovea OD decreased      Intravitreal Injection, Pharmacologic Agent - OD - Right Eye       Time Out 04/03/2021. 10:45 AM. Confirmed correct patient, procedure, site, and patient consented.   Anesthesia Topical anesthesia was used. Anesthetic medications included [place] 3.5%.   Procedure Preparation included Tobramycin 0.3%, 10% betadine to eyelids, 5% betadine to ocular surface. A 30 gauge needle was used.   Injection: 2.5 mg bevacizumab 2.5 MG/0.1ML   Route: Intravitreal, Site: Right Eye   NDC: [phone], Lot: [id number]   Post-op Post injection exam found visual acuity of at least counting fingers. The patient tolerated the procedure well. There were no complications. The patient received written and verbal post procedure care education. Post injection medications were not given.  ASSESSMENT/PLAN:  Proliferative diabetic retinopathy of right eye with macular edema associated with type 2 diabetes mellitus (HCC) Chronic NVI, rubeosis and PDR OD.  We will deliver Avastin again today at current interval of 5 weeks OS to prevent recurrence of NVI and yet also deliver PRP additional on the right eye with the hope of decreasing treatment burden to extend interval of examination     ICD-10-CM   1. Proliferative diabetic retinopathy of right eye  with macular edema associated with type 2 diabetes mellitus (HCC)  E11.3511 OCT, Retina - OU - Both Eyes    Intravitreal Injection, Pharmacologic Agent - OD - Right Eye    bevacizumab (AVASTIN) SOSY 2.5 mg      1.  OD with chronic active PDR with anterior segment NVI and rubeosis threatening vision loss yet stable acuity on chronic use of Avastin OD for residual CSME as well.  Repeated today injection at 5-week interval.  Follow-up again in 2 weeks for PRP closer to the posterior arcade so as to hopefully decrease treatment burden attentionally extend interval of examinations in the future  2.  Dilate OD in 2 weeks PRP  3.  Schedule follow-up OD in 5 weeks for exam and possible intravitreal Avastin  Ophthalmic Meds Ordered this visit:  Meds ordered this encounter  Medications   bevacizumab (AVASTIN) SOSY 2.5 mg       Return in about 2 weeks (around 04/17/2021) for dilate, OD, COLOR FP, PRP,,, and 5 weeks dilate OD Avastin OCT.  There are no Patient Instructions on file for this visit.   Explained the diagnoses, plan, and follow up with the patient and they expressed understanding.  Patient expressed understanding of the importance of proper follow up care.   Clent Demark Benigno Check M.D. Diseases & Surgery of the Retina and Vitreous Retina & Diabetic [place] 04/03/21     Abbreviations: M myopia (nearsighted); A astigmatism; H hyperopia (farsighted); P presbyopia; Mrx spectacle prescription;  CTL contact lenses; OD right eye; OS left eye; OU both eyes  XT exotropia; ET esotropia; PEK punctate epithelial keratitis; PEE punctate epithelial erosions; DES dry eye syndrome; MGD meibomian gland dysfunction; ATs artificial tears; PFAT's preservative free artificial tears; [place] nuclear sclerotic cataract; PSC posterior subcapsular cataract; ERM epi-retinal membrane; PVD posterior vitreous detachment; RD retinal detachment; DM diabetes mellitus; DR diabetic retinopathy; NPDR non-proliferative  diabetic retinopathy; PDR proliferative diabetic retinopathy; CSME clinically significant macular edema; DME diabetic macular edema; dbh dot blot hemorrhages; CWS cotton wool spot; POAG primary open angle glaucoma; C/D cup-to-disc ratio; HVF humphrey visual field; GVF goldmann visual field; OCT optical coherence tomography; IOP intraocular pressure; BRVO Branch retinal vein occlusion; CRVO central retinal vein occlusion; CRAO central retinal artery occlusion; BRAO branch retinal artery occlusion; RT retinal tear; SB scleral buckle; PPV pars plana vitrectomy; VH Vitreous hemorrhage; PRP panretinal laser photocoagulation; IVK intravitreal kenalog; VMT vitreomacular traction; MH Macular hole;  NVD neovascularization of the disc; NVE neovascularization elsewhere; AREDS age related eye disease study; ARMD age related macular degeneration; POAG primary open angle glaucoma; EBMD epithelial/anterior basement membrane dystrophy; ACIOL anterior chamber intraocular lens; IOL intraocular lens; PCIOL posterior chamber intraocular lens; Phaco/IOL phacoemulsification with intraocular lens placement; [place] photorefractive keratectomy; LASIK laser assisted in situ keratomileusis; HTN hypertension; DM diabetes mellitus; COPD chronic obstructive pulmonary disease

## 2021-04-03 NOTE — Assessment & Plan Note (Signed)
Chronic NVI, rubeosis and PDR OD.  We will deliver Avastin again today at current interval of 5 weeks OS to prevent recurrence of NVI and yet also deliver PRP additional on the right eye with the hope of decreasing treatment burden to extend interval of examination

## 2021-04-17 ENCOUNTER — Encounter (INDEPENDENT_AMBULATORY_CARE_PROVIDER_SITE_OTHER): Payer: Medicare Other | Admitting: Ophthalmology

## 2021-04-17 ENCOUNTER — Ambulatory Visit (INDEPENDENT_AMBULATORY_CARE_PROVIDER_SITE_OTHER): Payer: Medicare Other | Admitting: Ophthalmology

## 2021-04-17 ENCOUNTER — Other Ambulatory Visit: Payer: Self-pay

## 2021-04-17 ENCOUNTER — Encounter (INDEPENDENT_AMBULATORY_CARE_PROVIDER_SITE_OTHER): Payer: Self-pay | Admitting: Ophthalmology

## 2021-04-17 DIAGNOSIS — E113511 Type 2 diabetes mellitus with proliferative diabetic retinopathy with macular edema, right eye: Secondary | ICD-10-CM | POA: Diagnosis not present

## 2021-04-17 NOTE — Progress Notes (Signed)
04/17/2021     CHIEF COMPLAINT Patient presents for  Chief Complaint  Patient presents with   Retina Follow Up      HISTORY OF PRESENT ILLNESS: Derek Lowery is a 65 y.o. male who presents to the clinic today for:   HPI     Retina Follow Up   Patient presents with  Diabetic Retinopathy.  In right eye.  This started 2 weeks ago.  Severity is mild.  Duration of 2 weeks.  Since onset it is stable.        Comments   2 week prp OD and fp Pt states VA OU stable since last visit. Pt denies FOL, floaters, or ocular pain OU.  Does not check blood sugars and is not aware of what his last A1C was. Pt states, "I am still using all my drops but I do not know that names of them or how many times I am using them really."      Last edited by Kendra Opitz, COA on 04/17/2021  9:41 AM.      Referring physician: Ladell Pier, MD [place],  [place] [address]  HISTORICAL INFORMATION:   Selected notes from the MEDICAL RECORD NUMBER    Lab Results  Component Value Date   HGBA1C 8.8 (A) 11/16/2020     CURRENT MEDICATIONS: Current Outpatient Medications (Ophthalmic Drugs)  Medication Sig   brimonidine (ALPHAGAN) 0.2 % ophthalmic solution Place 1 drop into the right eye 2 (two) times daily.    dorzolamide (TRUSOPT) 2 % ophthalmic solution Place 1 drop into the right eye 2 (two) times daily.   latanoprost (XALATAN) 0.005 % ophthalmic solution Place 1 drop into the right eye at bedtime.   RHOPRESSA 0.02 % SOLN Place 1 drop into the right eye at bedtime.   timolol (BETIMOL) 0.5 % ophthalmic solution Place 1 drop into the right eye 2 (two) times daily.   No current facility-administered medications for this visit. (Ophthalmic Drugs)   Current Outpatient Medications (Other)  Medication Sig   amLODipine (NORVASC) 10 MG tablet Take 1 tablet (10 mg total) by mouth daily.   ammonium lactate (AMLACTIN) 12 % lotion Apply to both feet twice daily for dry skin.    aspirin EC 81 MG tablet Take 1 tablet (81 mg total) by mouth daily.   atorvastatin (LIPITOR) 40 MG tablet TAKE 1 TABLET BY MOUTH EVERY DAY   BD PEN NEEDLE NANO 2ND GEN 32G X 4 MM MISC USE AS DIRECTED 3 TIMES A DAY. PLEASE SCHEDULE AN APPOINTMENT FOR ADDITIONAL REFILLS.   blood glucose meter kit and supplies KIT Dispense based on patient and insurance preference. Use up to four times daily as directed. One Touch Verio   Continuous Blood Gluc Receiver (DEXCOM G6 RECEIVER) DEVI 1 Device by Does not apply route daily.   Continuous Blood Gluc Receiver (DEXCOM G6 RECEIVER) DEVI 1 Device by Does not apply route daily.   Continuous Blood Gluc Receiver (FREESTYLE LIBRE 14 DAY READER) DEVI USE AS DIRECTED   Continuous Blood Gluc Sensor (DEXCOM G6 SENSOR) MISC 1 packet by Does not apply route daily.   Continuous Blood Gluc Sensor (FREESTYLE LIBRE SENSOR SYSTEM) MISC Change sensor Q 2 wks   Continuous Blood Gluc Transmit (DEXCOM G6 TRANSMITTER) MISC 1 packet by Does not apply route daily.   ferrous gluconate (FERGON) 324 MG tablet Take 1 tablet (324 mg total) by mouth daily with breakfast.   glucose blood (ACCU-CHEK AVIVA PLUS)  test strip Use as instructed for 3 times daily testing of blood sugar. E11.9   glucose monitoring kit (FREESTYLE) monitoring kit 1 each by Does not apply route as needed for other.   hydrALAZINE (APRESOLINE) 50 MG tablet Take 1 tablet (50 mg total) by mouth 3 (three) times daily.   hydrocortisone (ANUSOL-HC) 2.5 % rectal cream Place 1 application rectally 2 (two) times daily as needed for hemorrhoids or anal itching (for flare of hemorrhoids).   insulin glargine (LANTUS SOLOSTAR) 100 UNIT/ML Solostar Pen Inject 38 Units into the skin at bedtime.   insulin lispro (HUMALOG KWIKPEN) 100 UNIT/ML KwikPen Inject 10 Units into the skin 3 (three) times daily with meals.   Lancets MISC Use as directed.  Accu chek 2   Multiple Vitamins-Minerals (MULTIVITAMIN WITH MINERALS) tablet Take 1 tablet by  mouth daily.   torsemide (DEMADEX) 20 MG tablet TAKE 2 TABLETS (40 MG TOTAL) BY MOUTH EVERY DAY   No current facility-administered medications for this visit. (Other)      REVIEW OF SYSTEMS:    ALLERGIES Allergies  Allergen Reactions   Coreg [Carvedilol] Other (See Comments)    Redness and swelling of calf    PAST MEDICAL HISTORY Past Medical History:  Diagnosis Date   Arthritis    CAD (coronary artery disease)    NSTEMI 06/2011:  LHC 07/18/11: Proximal diagonal 60%, distal LAD with a diabetic appearance and 60% stenosis, OM2 with an occluded superior branch and an inferior branch with 90%, EF 55% with inferior hypokinesis.  PCI: Promus DES to the OM2 inferior branch.  This vessel provides collaterals to the superior branch which remained occluded.  Echocardiogram 07/18/11: EF 60%, normal wall motion.   Cataract    Essential hypertension, benign    Glaucoma    Hypercholesteremia    Internal hemorrhoids    Noncompliance    Type 2 diabetes mellitus ([place]) 1990   Past Surgical History:  Procedure Laterality Date   CATARACT EXTRACTION Right 2019   Dr. Katy Fitch   CHOLECYSTECTOMY N/A 09/21/2017   Procedure: LAPAROSCOPIC CHOLECYSTECTOMY WITH INTRAOPERATIVE CHOLANGIOGRAM;  Surgeon: Michael Boston, MD;  Location: [facility];  Service: General;  Laterality: N/A;   COLONOSCOPY  2000   Dr. Collene Mares   EYE SURGERY     IRRIGATION AND DEBRIDEMENT ABSCESS Left 05/05/2014   Procedure: IRRIGATION AND DEBRIDEMENT ABSCESS left buttock;  Surgeon: Michael Boston, MD;  Location: [facility];  Service: General;  Laterality: Left;   LEFT HEART CATHETERIZATION WITH CORONARY ANGIOGRAM N/A 07/18/2011   Procedure: LEFT HEART CATHETERIZATION WITH CORONARY ANGIOGRAM;  Surgeon: Hillary Bow, MD;  Location: [facility] CATH LAB;  Service: Cardiovascular;  Laterality: N/A;   PERCUTANEOUS CORONARY STENT INTERVENTION (PCI-S)  07/18/2011   Procedure: PERCUTANEOUS CORONARY STENT INTERVENTION (PCI-S);  Surgeon: Hillary Bow, MD;   Location: [facility] CATH LAB;  Service: Cardiovascular;;    FAMILY HISTORY Family History  Problem Relation Age of Onset   Coronary artery disease Father        Developed in his 34s   Kidney disease Father    Diabetes Father    Melanoma Father    Rectal cancer Father    Heart failure Mother    Hypertension Mother    Diabetes Sister    Diabetes Brother    Colon cancer Neg Hx    Esophageal cancer Neg Hx    Stomach cancer Neg Hx     SOCIAL HISTORY Social History   Tobacco Use   Smoking status: Never   Smokeless  tobacco: Never  Vaping Use   Vaping Use: Never used  Substance Use Topics   Alcohol use: No   Drug use: No         OPHTHALMIC EXAM:  Base Eye Exam     Visual Acuity (ETDRS)       Right Left   Dist [place] 20/30 NLP   Dist ph [place] NI          Tonometry (Tonopen, 9:43 AM)       Right Left   Pressure 18 30         Pupils       Dark Light Shape React APD   Right 3 3 Irregular Minimal None   Left 3 3 Round Minimal +1         Visual Fields       Left Right   Restrictions Total superior temporal, inferior temporal, superior nasal, inferior nasal deficiencies Total inferior temporal deficiency; Partial outer inferior nasal deficiency         Neuro/Psych     Oriented x3: Yes   Mood/Affect: Normal         Dilation     Right eye: 1.0% Mydriacyl, 2.5% Phenylephrine @ 9:43 AM           Slit Lamp and Fundus Exam     External Exam       Right Left   External Normal Normal         Slit Lamp Exam       Right Left   Lids/Lashes Normal Normal   Conjunctiva/Sclera White and quiet, no exposure of tube White and quiet   Cornea Clear Clear   Anterior Chamber ,, Tube 12 meridian Deep   Iris Old NVI large trunks, no signs of new  NVI Old neovascularization of the iris 360 degrees, stable nonprogressive   Lens Posterior chamber intraocular lens 3+ Nuclear sclerosis   Anterior Vitreous Normal          Fundus Exam       Right Left    Posterior Vitreous Posterior vitreous detachment    Disc Normal    C/D Ratio 0.7    Macula Microaneurysms,, Retinal pigment epithelial atrophy, Macular thickening, no clinically significant macular edema    Vessels Proliferative diabetic retinopathy, quiet    Periphery  good panretinal photocoagulation, some retina remains posteriorly that could receive PRP             IMAGING AND PROCEDURES  Imaging and Procedures for 04/17/21  Panretinal Photocoagulation - OD - Right Eye       Time Out Confirmed correct patient, procedure, site, and patient consented.   Anesthesia Topical anesthesia was used. Anesthetic medications included Proparacaine 0.5%.   Laser Information The type of laser was diode. Color was yellow. The duration in seconds was 0.02. The spot size was 390 microns. Laser power was 280. Total spots was 692.   Post-op The patient tolerated the procedure well. There were no complications. The patient received written and verbal post procedure care education.   Notes NVI was completed along the posterior pole temporal arcade superiorly and temporal to the macula as well as inferiorly temporal and superiorly.     Color Fundus Photography Optos - OU - Both Eyes       Right Eye Progression has been stable. Disc findings include normal observations.   Notes Good PRP, PT you are still active with anterior segment neovascularization, clear media needs tightening of the PRP posteriorly  in hopes of decreasing vegF burden             ASSESSMENT/PLAN:  Proliferative diabetic retinopathy of right eye with macular edema associated with type 2 diabetes mellitus ([place]) With history of recurrent NVI in the past, will continue to decrease vegF burden attempts with PRP delivered today.  PRP delivered in a filling fashion  Follow-up as scheduled for injection of right      ICD-10-CM   1. Proliferative diabetic retinopathy of right eye with macular edema associated with  type 2 diabetes mellitus (HCC)  Z97.2820 Panretinal Photocoagulation - OD - Right Eye    Color Fundus Photography Optos - OU - Both Eyes      1.  Goal today is for the delivery of PRP OD to decrease vegF load  2.  Dilate OD next likely injection Avastin to control NVI  3.  Ophthalmic Meds Ordered this visit:  No orders of the defined types were placed in this encounter.      Return for As scheduled, dilate, OD, AVASTIN OCT.  There are no Patient Instructions on file for this visit.   Explained the diagnoses, plan, and follow up with the patient and they expressed understanding.  Patient expressed understanding of the importance of proper follow up care.   Clent Demark Marko Skalski M.D. Diseases & Surgery of the Retina and Vitreous Retina & Diabetic [place] 04/17/21     Abbreviations: M myopia (nearsighted); A astigmatism; H hyperopia (farsighted); P presbyopia; Mrx spectacle prescription;  CTL contact lenses; OD right eye; OS left eye; OU both eyes  XT exotropia; ET esotropia; PEK punctate epithelial keratitis; PEE punctate epithelial erosions; DES dry eye syndrome; MGD meibomian gland dysfunction; ATs artificial tears; PFAT's preservative free artificial tears; [place] nuclear sclerotic cataract; PSC posterior subcapsular cataract; ERM epi-retinal membrane; PVD posterior vitreous detachment; RD retinal detachment; DM diabetes mellitus; DR diabetic retinopathy; NPDR non-proliferative diabetic retinopathy; PDR proliferative diabetic retinopathy; CSME clinically significant macular edema; DME diabetic macular edema; dbh dot blot hemorrhages; CWS cotton wool spot; POAG primary open angle glaucoma; C/D cup-to-disc ratio; HVF humphrey visual field; GVF goldmann visual field; OCT optical coherence tomography; IOP intraocular pressure; BRVO Branch retinal vein occlusion; CRVO central retinal vein occlusion; CRAO central retinal artery occlusion; BRAO branch retinal artery occlusion; RT retinal tear; SB  scleral buckle; PPV pars plana vitrectomy; VH Vitreous hemorrhage; PRP panretinal laser photocoagulation; IVK intravitreal kenalog; VMT vitreomacular traction; MH Macular hole;  NVD neovascularization of the disc; NVE neovascularization elsewhere; AREDS age related eye disease study; ARMD age related macular degeneration; POAG primary open angle glaucoma; EBMD epithelial/anterior basement membrane dystrophy; ACIOL anterior chamber intraocular lens; IOL intraocular lens; PCIOL posterior chamber intraocular lens; Phaco/IOL phacoemulsification with intraocular lens placement; [place] photorefractive keratectomy; LASIK laser assisted in situ keratomileusis; HTN hypertension; DM diabetes mellitus; COPD chronic obstructive pulmonary disease

## 2021-04-17 NOTE — Assessment & Plan Note (Signed)
With history of recurrent NVI in the past, will continue to decrease vegF burden attempts with PRP delivered today.  PRP delivered in a filling fashion  Follow-up as scheduled for injection of right

## 2021-04-21 ENCOUNTER — Telehealth: Payer: Self-pay

## 2021-04-21 NOTE — Telephone Encounter (Signed)
Called pt to schedule their phone annual wellness visit, no answer, [place].

## 2021-04-30 ENCOUNTER — Other Ambulatory Visit: Payer: Self-pay | Admitting: Internal Medicine

## 2021-04-30 NOTE — Telephone Encounter (Signed)
Requested medications are due for refill today.  yes  Requested medications are on the active medications list.  yes  Last refill. 01/31/2021  Future visit scheduled.   yes  Notes to clinic.  Failed protocol d/t labs.

## 2021-05-03 DIAGNOSIS — E113553 Type 2 diabetes mellitus with stable proliferative diabetic retinopathy, bilateral: Secondary | ICD-10-CM | POA: Diagnosis not present

## 2021-05-03 DIAGNOSIS — Z794 Long term (current) use of insulin: Secondary | ICD-10-CM | POA: Diagnosis not present

## 2021-05-03 DIAGNOSIS — H4052X3 Glaucoma secondary to other eye disorders, left eye, severe stage: Secondary | ICD-10-CM | POA: Diagnosis not present

## 2021-05-03 DIAGNOSIS — H4089 Other specified glaucoma: Secondary | ICD-10-CM | POA: Diagnosis not present

## 2021-05-03 DIAGNOSIS — Z961 Presence of intraocular lens: Secondary | ICD-10-CM | POA: Diagnosis not present

## 2021-05-05 ENCOUNTER — Ambulatory Visit (HOSPITAL_BASED_OUTPATIENT_CLINIC_OR_DEPARTMENT_OTHER): Payer: Medicare Other

## 2021-05-05 DIAGNOSIS — Z0000 Encounter for general adult medical examination without abnormal findings: Secondary | ICD-10-CM | POA: Diagnosis not present

## 2021-05-05 NOTE — Progress Notes (Signed)
Subjective:   Derek Lowery is a 65 y.o. male who presents for Medicare Annual/Subsequent preventive examination.I connected with  Derek Lowery on 05/05/21 by a audio enabled telemedicine application and verified that I am speaking with the correct person using two identifiers.   I discussed the limitations of evaluation and management by telemedicine. The patient expressed understanding and agreed to proceed.   Location of patient: Home Location of provider: Office  Persons participating in visit: Derek Lowery (patient) and Drenda Freeze, CMA  Review of Systems    Defer to PCP       Objective:    There were no vitals filed for this visit. There is no height or weight on file to calculate BMI.  Advanced Directives 05/05/2021 12/24/2020 06/30/2020 06/28/2020 01/13/2020 03/17/2019 04/11/2018  Does Patient Have a Medical Advance Directive? No No No No No No No  Would patient like information on creating a medical advance directive? No - Patient declined No - Patient declined No - Patient declined No - Patient declined No - Patient declined Yes (MAU/Ambulatory/Procedural Areas - Information given) -  Pre-existing out of facility DNR order (yellow form or pink MOST form) - - - - - - -    Current Medications (verified) Outpatient Encounter Medications as of 05/05/2021  Medication Sig   amLODipine (NORVASC) 10 MG tablet Take 1 tablet (10 mg total) by mouth daily.   ammonium lactate (AMLACTIN) 12 % lotion Apply to both feet twice daily for dry skin.   aspirin EC 81 MG tablet Take 1 tablet (81 mg total) by mouth daily.   atorvastatin (LIPITOR) 40 MG tablet TAKE 1 TABLET BY MOUTH EVERY DAY   BD PEN NEEDLE NANO 2ND GEN 32G X 4 MM MISC USE AS DIRECTED 3 TIMES A DAY. PLEASE SCHEDULE AN APPOINTMENT FOR ADDITIONAL REFILLS.   blood glucose meter kit and supplies KIT Dispense based on patient and insurance preference. Use up to four times daily as directed. One Touch Verio   brimonidine  (ALPHAGAN) 0.2 % ophthalmic solution Place 1 drop into the right eye 2 (two) times daily.    Continuous Blood Gluc Receiver (DEXCOM G6 RECEIVER) DEVI 1 Device by Does not apply route daily.   Continuous Blood Gluc Receiver (DEXCOM G6 RECEIVER) DEVI 1 Device by Does not apply route daily.   Continuous Blood Gluc Receiver (FREESTYLE LIBRE 14 DAY READER) DEVI USE AS DIRECTED   Continuous Blood Gluc Sensor (DEXCOM G6 SENSOR) MISC 1 packet by Does not apply route daily.   Continuous Blood Gluc Sensor (FREESTYLE LIBRE SENSOR SYSTEM) MISC Change sensor Q 2 wks   Continuous Blood Gluc Transmit (DEXCOM G6 TRANSMITTER) MISC 1 packet by Does not apply route daily.   dorzolamide (TRUSOPT) 2 % ophthalmic solution Place 1 drop into the right eye 2 (two) times daily.   ferrous gluconate (FERGON) 324 MG tablet Take 1 tablet (324 mg total) by mouth daily with breakfast.   glucose blood (ACCU-CHEK AVIVA PLUS) test strip Use as instructed for 3 times daily testing of blood sugar. E11.9   glucose monitoring kit (FREESTYLE) monitoring kit 1 each by Does not apply route as needed for other.   hydrALAZINE (APRESOLINE) 50 MG tablet TAKE 1 TABLET BY MOUTH THREE TIMES A DAY (STOP THE CARVEDILOL)   hydrocortisone (ANUSOL-HC) 2.5 % rectal cream Place 1 application rectally 2 (two) times daily as needed for hemorrhoids or anal itching (for flare of hemorrhoids).   insulin glargine (LANTUS SOLOSTAR) 100 UNIT/ML Solostar  Pen Inject 38 Units into the skin at bedtime.   insulin lispro (HUMALOG KWIKPEN) 100 UNIT/ML KwikPen Inject 10 Units into the skin 3 (three) times daily with meals.   Lancets MISC Use as directed.  Accu chek 2   latanoprost (XALATAN) 0.005 % ophthalmic solution Place 1 drop into the right eye at bedtime.   Multiple Vitamins-Minerals (MULTIVITAMIN WITH MINERALS) tablet Take 1 tablet by mouth daily.   RHOPRESSA 0.02 % SOLN Place 1 drop into the right eye at bedtime.   timolol (BETIMOL) 0.5 % ophthalmic solution  Place 1 drop into the right eye 2 (two) times daily.   torsemide (DEMADEX) 20 MG tablet TAKE 2 TABLETS (40 MG TOTAL) BY MOUTH EVERY DAY   No facility-administered encounter medications on file as of 05/05/2021.    Allergies (verified) Coreg [carvedilol]   History: Past Medical History:  Diagnosis Date   Arthritis    CAD (coronary artery disease)    NSTEMI 06/2011:  LHC 07/18/11: Proximal diagonal 60%, distal LAD with a diabetic appearance and 60% stenosis, OM2 with an occluded superior branch and an inferior branch with 90%, EF 55% with inferior hypokinesis.  PCI: Promus DES to the OM2 inferior branch.  This vessel provides collaterals to the superior branch which remained occluded.  Echocardiogram 07/18/11: EF 60%, normal wall motion.   Cataract    Essential hypertension, benign    Glaucoma    Hypercholesteremia    Internal hemorrhoids    Noncompliance    Type 2 diabetes mellitus ([place]) 1990   Past Surgical History:  Procedure Laterality Date   CATARACT EXTRACTION Right 2019   Dr. Katy Fitch   CHOLECYSTECTOMY N/A 09/21/2017   Procedure: LAPAROSCOPIC CHOLECYSTECTOMY WITH INTRAOPERATIVE CHOLANGIOGRAM;  Surgeon: Michael Boston, MD;  Location: [facility];  Service: General;  Laterality: N/A;   COLONOSCOPY  2000   Dr. Collene Mares   EYE SURGERY     IRRIGATION AND DEBRIDEMENT ABSCESS Left 05/05/2014   Procedure: IRRIGATION AND DEBRIDEMENT ABSCESS left buttock;  Surgeon: Michael Boston, MD;  Location: [facility];  Service: General;  Laterality: Left;   LEFT HEART CATHETERIZATION WITH CORONARY ANGIOGRAM N/A 07/18/2011   Procedure: LEFT HEART CATHETERIZATION WITH CORONARY ANGIOGRAM;  Surgeon: Hillary Bow, MD;  Location[facility] C[facility] LAB;  Service: Cardiovascular;  Laterality: N/A;   PERCUTANEOUS CORONARY STENT INTERVENTION (PCI-S)  07/18/2011   Procedure: PERCUTANEOUS CORONARY STENT INTERVENTION (PCI-S);  Surgeon: Hillary Bow, MD;  Location: [facility] CATH LAB;  Service: Cardiovascular;;   Family History  Problem  Relation Age of Onset   Coronary artery disease Father        Developed in his 35s   Kidney disease Father    Diabetes Father    Melanoma Father    Rectal cancer Father    Heart failure Mother    Hypertension Mother    Diabetes Sister    Diabetes Brother    Colon cancer Neg Hx    Esophageal cancer Neg Hx    Stomach cancer Neg Hx    Social History   Socioeconomic History   Marital status: Single    Spouse name: Not on file   Number of children: Not on file   Years of education: Not on file   Highest education level: Not on file  Occupational History   Occupation: Janitor    Employer: [employer]  Tobacco Use   Smoking status: Never   Smokeless tobacco: Never  Vaping Use   Vaping Use: Never used  Substance and Sexual Activity  Alcohol use: No   Drug use: No   Sexual activity: Never  Other Topics Concern   Not on file  Social History Narrative   Works at [employer]   [phone] [employer]   Social Determinants of Health   Financial Resource Strain: Low Risk    Difficulty of Paying Living Expenses: Not hard at all  Food Insecurity: No Food Insecurity   Worried About Charity fundraiser in the Last Year: Never true   Arboriculturist in the Last Year: Never true  Transportation Needs: No Transportation Needs   Lack of Transportation (Medical): No   Lack of Transportation (Non-Medical): No  Physical Activity: Inactive   Days of Exercise per Week: 0 days   Minutes of Exercise per Session: 0 min  Stress: Stress Concern Present   Feeling of Stress : To some extent  Social Connections: Socially Isolated   Frequency of Communication with Friends and Family: Never   Frequency of Social Gatherings with Friends and Family: More than three times a week   Attends Religious Services: Never   Marine scientist or Organizations: No   Attends Music therapist: Never   Marital Status: Never married    Tobacco Counseling Counseling given: Not  Answered   Clinical Intake:  Pre-visit preparation completed: Yes  Pain : No/denies pain     Diabetes: Yes CBG done?: No Did pt. bring in CBG monitor from home?: No  How often do you need to have someone help you when you read instructions, pamphlets, or other written materials from your doctor or pharmacy?: 1 - Never What is the last grade level you completed in school?: 9th Grade  Diabetic?Yes  Interpreter Needed?: No  Information entered by :: Drenda Freeze, [place]   Activities of Daily Living In your present state of health, do you have any difficulty performing the following activities: 05/05/2021 12/25/2020  Hearing? Y N  Vision? Y N  Difficulty concentrating or making decisions? N N  Walking or climbing stairs? Y Y  Dressing or bathing? N N  Doing errands, shopping? Y N  Preparing Food and eating ? N -  Using the Toilet? N -  In the past six months, have you accidently leaked urine? N -  Do you have problems with loss of bowel control? N -  Managing your Medications? N -  Managing your Finances? N -  Housekeeping or managing your Housekeeping? N -  Some recent data might be hidden    Patient Care Team: Ladell Pier, MD as PCP - General (Internal Medicine) Skeet Latch, MD as PCP - Cardiology (Cardiology) Hillary Bow, MD as Consulting Physician (Cardiology) Juanita Craver, MD as Consulting Physician (Gastroenterology)  Indicate any recent Medical Services you may have received from other than [facility] providers in the past year (date may be approximate).     Assessment:   This is a routine wellness examination for Ezel.  Hearing/Vision screen No results found.  Dietary issues and exercise activities discussed:     Goals Addressed   None    Depression Screen PHQ 2/9 Scores 05/05/2021 01/13/2020 10/17/2019 03/17/2019 09/24/2018 03/23/2018 10/20/2017  PHQ - 2 Score 0 0 0 0 0 1 1  PHQ- 9 Score - - 0 - 2 - 3  Exception Documentation - - - (No Data)  - - -    Fall Risk Fall Risk  05/05/2021 01/13/2020 10/17/2019 03/17/2019 09/24/2018  Falls in the past year? 0 0 0  0 0  Number falls in past yr: 0 0 0 0 -  Injury with Fall? 0 0 0 - -  Risk for fall due to : No Fall Risks - - - -  Follow up Falls evaluation completed Falls evaluation completed - - -    FALL RISK PREVENTION PERTAINING TO THE HOME:  Any stairs in or around the home? Yes  If so, are there any without handrails? No  Home free of loose throw rugs in walkways, pet beds, electrical cords, etc? Yes  Adequate lighting in your home to reduce risk of falls? Yes   ASSISTIVE DEVICES UTILIZED TO PREVENT FALLS:  Life alert? No  Use of a cane, walker or w/c? Yes  Grab bars in the bathroom? No  Shower chair or bench in shower? Yes  Elevated toilet seat or a handicapped toilet? No   TIMED UP AND GO:  Was the test performed?  N/A .  Length of time to ambulate 10 feet: N/A sec.     Cognitive Function: MMSE - Mini Mental State Exam 01/13/2020  Orientation to time 5  Orientation to Place 5  Registration 3  Attention/ Calculation 5  Recall 3  Language- name 2 objects 2  Language- repeat 1  Language- follow 3 step command 3  Language- read & follow direction 1  Write a sentence 1  Copy design 1  Total score 30     6CIT Screen 05/05/2021  What Year? 0 points  What month? 0 points  What time? 0 points  Count back from 20 0 points  Months in reverse 0 points  Repeat phrase 10 points  Total Score 10    Immunizations Immunization History  Administered Date(s) Administered   Influenza,inj,Quad PF,6+ Mos 05/19/2014, 06/09/2017, 06/24/2018, 07/12/2019   Pneumococcal Polysaccharide-23 05/19/2014   Tdap 03/26/2015    TDAP status: Up to date  Flu Vaccine status: Due, Education has been provided regarding the importance of this vaccine. Advised may receive this vaccine at local pharmacy or Health Dept. Aware to provide a copy of the vaccination record if obtained from local  pharmacy or Health Dept. Verbalized acceptance and understanding.  Covid-19 vaccine status: Information provided on how to obtain vaccines.   Qualifies for Shingles Vaccine? Yes   Zostavax completed No   Shingrix Completed?: No.    Education has been provided regarding the importance of this vaccine. Patient has been advised to call insurance company to determine out of pocket expense if they have not yet received this vaccine. Advised may also receive vaccine at local pharmacy or Health Dept. Verbalized acceptance and understanding.  Screening Tests Health Maintenance  Topic Date Due   COVID-19 Vaccine (1) Never done   Zoster Vaccines- Shingrix (1 of 2) Never done   INFLUENZA VACCINE  03/18/2021   HEMOGLOBIN A1C  05/18/2021   FOOT EXAM  11/16/2021   OPHTHALMOLOGY EXAM  01/21/2022   TETANUS/TDAP  03/25/2025   COLONOSCOPY (Pts 45-20yr Insurance coverage will need to be confirmed)  01/23/2027   Hepatitis C Screening  Completed   HIV Screening  Completed   HPV VACCINES  Aged Out    Health Maintenance  Health Maintenance Due  Topic Date Due   COVID-19 Vaccine (1) Never done   Zoster Vaccines- Shingrix (1 of 2) Never done   INFLUENZA VACCINE  03/18/2021    Colorectal cancer screening: Type of screening: Colonoscopy. Completed 01/22/2017. Repeat every 10 years  Lung Cancer Screening: (Low Dose CT Chest recommended if Age  55-80 years, 30 pack-year currently smoking OR have quit w/in 15years.) does not qualify.   Lung Cancer Screening Referral: No  Additional Screening:  Hepatitis C Screening: does qualify; Completed 10/26/2020.  Vision Screening: Recommended annual ophthalmology exams for early detection of glaucoma and other disorders of the eye. Is the patient up to date with their annual eye exam?  Yes  Who is the provider or what is the name of the office in which the patient attends annual eye exams? [facility] If pt is not established with a provider, would they like  to be referred to a provider to establish care? No .   Dental Screening: Recommended annual dental exams for proper oral hygiene  Community Resource Referral / Chronic Care Management: CRR required this visit?  No   CCM required this visit?  No      Plan:     I have personally reviewed and noted the following in the patient's chart:   Medical and social history Use of alcohol, tobacco or illicit drugs  Current medications and supplements including opioid prescriptions. Patient is not currently taking opioid prescriptions. Functional ability and status Nutritional status Physical activity Advanced directives List of other physicians Hospitalizations, surgeries, and ER visits in previous 12 months Vitals Screenings to include cognitive, depression, and falls Referrals and appointments  In addition, I have reviewed and discussed with patient certain preventive protocols, quality metrics, and best practice recommendations. A written personalized care plan for preventive services as well as general preventive health recommendations were provided to patient.     Drenda Freeze, [facility]   05/05/2021   Nurse Notes: Non-Face to Face 60 minute visit Encounter.  Derek Lowery , Thank you for taking time to come for your Medicare Wellness Visit. I appreciate your ongoing commitment to your health goals. Please review the following plan we discussed and let me know if I can assist you in the future.   These are the goals we discussed:  Goals   None     This is a list of the screening recommended for you and due dates:  Health Maintenance  Topic Date Due   COVID-19 Vaccine (1) Never done   Zoster (Shingles) Vaccine (1 of 2) Never done   Flu Shot  03/18/2021   Hemoglobin A1C  05/18/2021   Complete foot exam   11/16/2021   Eye exam for diabetics  01/21/2022   Tetanus Vaccine  03/25/2025   Colon Cancer Screening  01/23/2027   Hepatitis C Screening: USPSTF Recommendation to screen - Ages  18-79 yo.  Completed   HIV Screening  Completed   HPV Vaccine  Aged Out

## 2021-05-05 NOTE — Patient Instructions (Signed)
Health Maintenance, Male Adopting a healthy lifestyle and getting preventive care are important in promoting health and wellness. Ask your health care provider about: The right schedule for you to have regular tests and exams. Things you can do on your own to prevent diseases and keep yourself healthy. What should I know about diet, weight, and exercise? Eat a healthy diet  Eat a diet that includes plenty of vegetables, fruits, low-fat dairy products, and lean protein. Do not eat a lot of foods that are high in solid fats, added sugars, or sodium. Maintain a healthy weight Body mass index (BMI) is a measurement that can be used to identify possible weight problems. It estimates body fat based on height and weight. Your health care provider can help determine your BMI and help you achieve or maintain a healthy weight. Get regular exercise Get regular exercise. This is one of the most important things you can do for your health. Most adults should: Exercise for at least 150 minutes each week. The exercise should increase your heart rate and make you sweat (moderate-intensity exercise). Do strengthening exercises at least twice a week. This is in addition to the moderate-intensity exercise. Spend less time sitting. Even light physical activity can be beneficial. Watch cholesterol and blood lipids Have your blood tested for lipids and cholesterol at 63 years of age, then have this test every 5 years. You may need to have your cholesterol levels checked more often if: Your lipid or cholesterol levels are high. You are older than 63 years of age. You are at high risk for heart disease. What should I know about cancer screening? Many types of cancers can be detected early and may often be prevented. Depending on your health history and family history, you may need to have cancer screening at various ages. This may include screening for: Colorectal cancer. Prostate cancer. Skin cancer. Lung  cancer. What should I know about heart disease, diabetes, and high blood pressure? Blood pressure and heart disease High blood pressure causes heart disease and increases the risk of stroke. This is more likely to develop in people who have high blood pressure readings, are of African descent, or are overweight. Talk with your health care provider about your target blood pressure readings. Have your blood pressure checked: Every 3-5 years if you are 18-39 years of age. Every year if you are 40 years old or older. If you are between the ages of 65 and 75 and are a current or former smoker, ask your health care provider if you should have a one-time screening for abdominal aortic aneurysm (AAA). Diabetes Have regular diabetes screenings. This checks your fasting blood sugar level. Have the screening done: Once every three years after age 45 if you are at a normal weight and have a low risk for diabetes. More often and at a younger age if you are overweight or have a high risk for diabetes. What should I know about preventing infection? Hepatitis B If you have a higher risk for hepatitis B, you should be screened for this virus. Talk with your health care provider to find out if you are at risk for hepatitis B infection. Hepatitis C Blood testing is recommended for: Everyone born from 1945 through 1965. Anyone with known risk factors for hepatitis C. Sexually transmitted infections (STIs) You should be screened each year for STIs, including gonorrhea and chlamydia, if: You are sexually active and are younger than 63 years of age. You are older than 63 years   of age and your health care provider tells you that you are at risk for this type of infection. Your sexual activity has changed since you were last screened, and you are at increased risk for chlamydia or gonorrhea. Ask your health care provider if you are at risk. Ask your health care provider about whether you are at high risk for HIV.  Your health care provider may recommend a prescription medicine to help prevent HIV infection. If you choose to take medicine to prevent HIV, you should first get tested for HIV. You should then be tested every 3 months for as long as you are taking the medicine. Follow these instructions at home: Lifestyle Do not use any products that contain nicotine or tobacco, such as cigarettes, e-cigarettes, and chewing tobacco. If you need help quitting, ask your health care provider. Do not use street drugs. Do not share needles. Ask your health care provider for help if you need support or information about quitting drugs. Alcohol use Do not drink alcohol if your health care provider tells you not to drink. If you drink alcohol: Limit how much you have to 0-2 drinks a day. Be aware of how much alcohol is in your drink. In the [place] one drink equals one 12 oz bottle of beer (355 mL), one 5 oz glass of wine (148 mL), or one 1 oz glass of hard liquor (44 mL). General instructions Schedule regular health, dental, and eye exams. Stay current with your vaccines. Tell your health care provider if: You often feel depressed. You have ever been abused or do not feel safe at home. Summary Adopting a healthy lifestyle and getting preventive care are important in promoting health and wellness. Follow your health care provider's instructions about healthy diet, exercising, and getting tested or screened for diseases. Follow your health care provider's instructions on monitoring your cholesterol and blood pressure. This information is not intended to replace advice given to you by your health care provider. Make sure you discuss any questions you have with your health care provider. Document Revised: 10/12/2020 Document Reviewed: 07/28/2018 Elsevier Patient Education  2022 [employer]  

## 2021-05-08 ENCOUNTER — Other Ambulatory Visit: Payer: Self-pay

## 2021-05-08 ENCOUNTER — Encounter (INDEPENDENT_AMBULATORY_CARE_PROVIDER_SITE_OTHER): Payer: Self-pay | Admitting: Ophthalmology

## 2021-05-08 ENCOUNTER — Ambulatory Visit (INDEPENDENT_AMBULATORY_CARE_PROVIDER_SITE_OTHER): Payer: Medicare Other | Admitting: Ophthalmology

## 2021-05-08 DIAGNOSIS — E113511 Type 2 diabetes mellitus with proliferative diabetic retinopathy with macular edema, right eye: Secondary | ICD-10-CM

## 2021-05-08 MED ORDER — BEVACIZUMAB 2.5 MG/0.1ML IZ SOSY
2.5000 mg | PREFILLED_SYRINGE | INTRAVITREAL | Status: AC | PRN
  Administered 2021-05-08: 2.5 mg via INTRAVITREAL

## 2021-05-08 NOTE — Progress Notes (Signed)
05/08/2021     CHIEF COMPLAINT Patient presents for  Chief Complaint  Patient presents with   Retina Follow Up      HISTORY OF PRESENT ILLNESS: Derek Lowery is a 65 y.o. male who presents to the clinic today for:   HPI     Retina Follow Up   Patient presents with  Diabetic Retinopathy.  In right eye.  This started 5 weeks ago.  Severity is mild.  Duration of 5 weeks.  Since onset it is stable.        Comments   5 week fu od oct avastin od. Patient states vision is stable and unchanged since last visit. Denies any new floaters or FOL. Rhopressa OD qhs, brimonidine OD bid, latanoprost qhs OD, timolol bid OD, dorzolamide OD bid. Does not check BS.      Last edited by Laurin Coder on 05/08/2021  9:52 AM.      Referring physician: Ladell Pier, MD [place],  [place] [address]  HISTORICAL INFORMATION:   Selected notes from the MEDICAL RECORD NUMBER    Lab Results  Component Value Date   HGBA1C 8.8 (A) 11/16/2020     CURRENT MEDICATIONS: Current Outpatient Medications (Ophthalmic Drugs)  Medication Sig   brimonidine (ALPHAGAN) 0.2 % ophthalmic solution Place 1 drop into the right eye 2 (two) times daily.    dorzolamide (TRUSOPT) 2 % ophthalmic solution Place 1 drop into the right eye 2 (two) times daily.   latanoprost (XALATAN) 0.005 % ophthalmic solution Place 1 drop into the right eye at bedtime.   RHOPRESSA 0.02 % SOLN Place 1 drop into the right eye at bedtime.   timolol (BETIMOL) 0.5 % ophthalmic solution Place 1 drop into the right eye 2 (two) times daily.   No current facility-administered medications for this visit. (Ophthalmic Drugs)   Current Outpatient Medications (Other)  Medication Sig   amLODipine (NORVASC) 10 MG tablet Take 1 tablet (10 mg total) by mouth daily.   ammonium lactate (AMLACTIN) 12 % lotion Apply to both feet twice daily for dry skin.   aspirin EC 81 MG tablet Take 1 tablet (81 mg total) by mouth daily.    atorvastatin (LIPITOR) 40 MG tablet TAKE 1 TABLET BY MOUTH EVERY DAY   BD PEN NEEDLE NANO 2ND GEN 32G X 4 MM MISC USE AS DIRECTED 3 TIMES A DAY. PLEASE SCHEDULE AN APPOINTMENT FOR ADDITIONAL REFILLS.   blood glucose meter kit and supplies KIT Dispense based on patient and insurance preference. Use up to four times daily as directed. One Touch Verio   Continuous Blood Gluc Receiver (DEXCOM G6 RECEIVER) DEVI 1 Device by Does not apply route daily.   Continuous Blood Gluc Receiver (DEXCOM G6 RECEIVER) DEVI 1 Device by Does not apply route daily.   Continuous Blood Gluc Receiver (FREESTYLE LIBRE 14 DAY READER) DEVI USE AS DIRECTED   Continuous Blood Gluc Sensor (DEXCOM G6 SENSOR) MISC 1 packet by Does not apply route daily.   Continuous Blood Gluc Sensor (FREESTYLE LIBRE SENSOR SYSTEM) MISC Change sensor Q 2 wks   Continuous Blood Gluc Transmit (DEXCOM G6 TRANSMITTER) MISC 1 packet by Does not apply route daily.   ferrous gluconate (FERGON) 324 MG tablet Take 1 tablet (324 mg total) by mouth daily with breakfast.   glucose blood (ACCU-CHEK AVIVA PLUS) test strip Use as instructed for 3 times daily testing of blood sugar. E11.9   glucose monitoring kit (FREESTYLE) monitoring kit 1 each  by Does not apply route as needed for other.   hydrALAZINE (APRESOLINE) 50 MG tablet TAKE 1 TABLET BY MOUTH THREE TIMES A DAY (STOP THE CARVEDILOL)   hydrocortisone (ANUSOL-HC) 2.5 % rectal cream Place 1 application rectally 2 (two) times daily as needed for hemorrhoids or anal itching (for flare of hemorrhoids).   insulin glargine (LANTUS SOLOSTAR) 100 UNIT/ML Solostar Pen Inject 38 Units into the skin at bedtime.   insulin lispro (HUMALOG KWIKPEN) 100 UNIT/ML KwikPen Inject 10 Units into the skin 3 (three) times daily with meals.   Lancets MISC Use as directed.  Accu chek 2   Multiple Vitamins-Minerals (MULTIVITAMIN WITH MINERALS) tablet Take 1 tablet by mouth daily.   torsemide (DEMADEX) 20 MG tablet TAKE 2 TABLETS  (40 MG TOTAL) BY MOUTH EVERY DAY   No current facility-administered medications for this visit. (Other)      REVIEW OF SYSTEMS:    ALLERGIES Allergies  Allergen Reactions   Coreg [Carvedilol] Other (See Comments)    Redness and swelling of calf    PAST MEDICAL HISTORY Past Medical History:  Diagnosis Date   Arthritis    CAD (coronary artery disease)    NSTEMI 06/2011:  LHC 07/18/11: Proximal diagonal 60%, distal LAD with a diabetic appearance and 60% stenosis, OM2 with an occluded superior branch and an inferior branch with 90%, EF 55% with inferior hypokinesis.  PCI: Promus DES to the OM2 inferior branch.  This vessel provides collaterals to the superior branch which remained occluded.  Echocardiogram 07/18/11: EF 60%, normal wall motion.   Cataract    Essential hypertension, benign    Glaucoma    Hypercholesteremia    Internal hemorrhoids    Noncompliance    Type 2 diabetes mellitus ([place]) 1990   Past Surgical History:  Procedure Laterality Date   CATARACT EXTRACTION Right 2019   Dr. Katy Fitch   CHOLECYSTECTOMY N/A 09/21/2017   Procedure: LAPAROSCOPIC CHOLECYSTECTOMY WITH INTRAOPERATIVE CHOLANGIOGRAM;  Surgeon: Michael Boston, MD;  Location: [facility];  Service: General;  Laterality: N/A;   COLONOSCOPY  2000   Dr. Collene Mares   EYE SURGERY     IRRIGATION AND DEBRIDEMENT ABSCESS Left 05/05/2014   Procedure: IRRIGATION AND DEBRIDEMENT ABSCESS left buttock;  Surgeon: Michael Boston, MD;  Location: [facility];  Service: General;  Laterality: Left;   LEFT HEART CATHETERIZATION WITH CORONARY ANGIOGRAM N/A 07/18/2011   Procedure: LEFT HEART CATHETERIZATION WITH CORONARY ANGIOGRAM;  Surgeon: Hillary Bow, MD;  Location: [facility] C[facility] LAB;  Service: Cardiovascular;  Laterality: N/A;   PERCUTANEOUS CORONARY STENT INTERVENTION (PCI-S)  07/18/2011   Procedure: PERCUTANEOUS CORONARY STENT INTERVENTION (PCI-S);  Surgeon: Hillary Bow, MD;  [facility];  Service: Cardiovascular;;    FAMILY  HISTORY Family History  Problem Relation Age of Onset   Coronary artery disease Father        Developed in his 65s   Kidney disease Father    Diabetes Father    Melanoma Father    Rectal cancer Father    Heart failure Mother    Hypertension Mother    Diabetes Sister    Diabetes Brother    Colon cancer Neg Hx    Esophageal cancer Neg Hx    Stomach cancer Neg Hx     SOCIAL HISTORY Social History   Tobacco Use   Smoking status: Never   Smokeless tobacco: Never  Vaping Use   Vaping Use: Never used  Substance Use Topics   Alcohol use: No   Drug use:  No         OPHTHALMIC EXAM:  Base Eye Exam     Visual Acuity (ETDRS)       Right Left   Dist [place] 20/25 -2 NLP         Tonometry (Tonopen, 9:54 AM)       Right Left   Pressure 15 31         Pupils       Dark Shape React APD   Right  Irregular  None   Left 3  Minimal Trace         Extraocular Movement       Right Left    Full Full         Neuro/Psych     Oriented x3: Yes   Mood/Affect: Normal         Dilation     Right eye: 1.0% Mydriacyl, 2.5% Phenylephrine @ 9:54 AM           Slit Lamp and Fundus Exam     External Exam       Right Left   External Normal Normal         Slit Lamp Exam       Right Left   Lids/Lashes Normal Normal   Conjunctiva/Sclera White and quiet, no exposure of tube White and quiet   Cornea Clear Clear   Anterior Chamber ,, Tube 12 meridian Deep   Iris Old NVI large trunks, no signs of new  NVI Old neovascularization of the iris 360 degrees, stable nonprogressive   Lens Posterior chamber intraocular lens 3+ Nuclear sclerosis   Anterior Vitreous Normal          Fundus Exam       Right Left   Posterior Vitreous Posterior vitreous detachment    Disc Normal    C/D Ratio 0.7    Macula Microaneurysms,, Retinal pigment epithelial atrophy, Macular thickening, no clinically significant macular edema    Vessels Proliferative diabetic retinopathy, quiet     Periphery  good panretinal photocoagulation, post recent PRP             IMAGING AND PROCEDURES  Imaging and Procedures for 05/08/21  OCT, Retina - OU - Both Eyes       Right Eye Quality was good. Scan locations included subfoveal. Central Foveal Thickness: 296. Progression has been stable. Findings include cystoid macular edema.   Notes Chronic CSME, slightly increased now some 5 weeks post recent Avastin and 3 weeks post peripheral PRP.  Center thickness of the fovea OD increase, repeat injection Avastin OD today     Intravitreal Injection, Pharmacologic Agent - OD - Right Eye       Time Out 05/08/2021. 11:00 AM. Confirmed correct patient, procedure, site, and patient consented.   Anesthesia Topical anesthesia was used. Anesthetic medications included Akten 3.5%.   Procedure Preparation included Tobramycin 0.3%, 10% betadine to eyelids, 5% betadine to ocular surface. A 30 gauge needle was used.   Injection: 2.5 mg bevacizumab 2.5 MG/0.1ML   Route: Intravitreal, Site: Right Eye   NDC: [phone], Lot: [id number]   Post-op Post injection exam found visual acuity of at least counting fingers. The patient tolerated the procedure well. There were no complications. The patient received written and verbal post procedure care education. Post injection medications were not given.              ASSESSMENT/PLAN:  Proliferative diabetic retinopathy of right eye with macular edema associated with type 2  diabetes mellitus ([place]) CSME OD has recurred somewhat at 5-week interval today, will need repeat injection Avastin.  Also some 3 weeks post recent additional PRP and attempt to decrease vegF burden.     ICD-10-CM   1. Proliferative diabetic retinopathy of right eye with macular edema associated with type 2 diabetes mellitus (HCC)  E11.3511 OCT, Retina - OU - Both Eyes    Intravitreal Injection, Pharmacologic Agent - OD - Right Eye    bevacizumab (AVASTIN) SOSY 2.5 mg       1.  Repeat injection intravitreal Avastin OD today to control neovascularization of the iris, NPDR and CSME  2.  Repeat examination next in 5 weeks.  Do not dilate prior to Dr. [name] exam  3.  Okay to consent for Avastin OCT OD and OCT  Ophthalmic Meds Ordered this visit:  Meds ordered this encounter  Medications   bevacizumab (AVASTIN) SOSY 2.5 mg       Return in about 5 weeks (around 06/12/2021) for NO DILATE, OD, AVASTIN OCT,, Dr. [name] to examine pupil and iris prior to dilation.  There are no Patient Instructions on file for this visit.   Explained the diagnoses, plan, and follow up with the patient and they expressed understanding.  Patient expressed understanding of the importance of proper follow up care.   Clent Demark Shannin Naab M.D. Diseases & Surgery of the Retina and Vitreous Retina & Diabetic [place] 05/08/21     Abbreviations: M myopia (nearsighted); A astigmatism; H hyperopia (farsighted); P presbyopia; Mrx spectacle prescription;  CTL contact lenses; OD right eye; OS left eye; OU both eyes  XT exotropia; ET esotropia; PEK punctate epithelial keratitis; PEE punctate epithelial erosions; DES dry eye syndrome; MGD meibomian gland dysfunction; ATs artificial tears; PFAT's preservative free artificial tears; [place] nuclear sclerotic cataract; PSC posterior subcapsular cataract; ERM epi-retinal membrane; PVD posterior vitreous detachment; RD retinal detachment; DM diabetes mellitus; DR diabetic retinopathy; NPDR non-proliferative diabetic retinopathy; PDR proliferative diabetic retinopathy; CSME clinically significant macular edema; DME diabetic macular edema; dbh dot blot hemorrhages; CWS cotton wool spot; POAG primary open angle glaucoma; C/D cup-to-disc ratio; HVF humphrey visual field; GVF goldmann visual field; OCT optical coherence tomography; IOP intraocular pressure; BRVO Branch retinal vein occlusion; CRVO central retinal vein occlusion; CRAO central retinal artery  occlusion; BRAO branch retinal artery occlusion; RT retinal tear; SB scleral buckle; PPV pars plana vitrectomy; VH Vitreous hemorrhage; PRP panretinal laser photocoagulation; IVK intravitreal kenalog; VMT vitreomacular traction; MH Macular hole;  NVD neovascularization of the disc; NVE neovascularization elsewhere; AREDS age related eye disease study; ARMD age related macular degeneration; POAG primary open angle glaucoma; EBMD epithelial/anterior basement membrane dystrophy; ACIOL anterior chamber intraocular lens; IOL intraocular lens; PCIOL posterior chamber intraocular lens; Phaco/IOL phacoemulsification with intraocular lens placement; [place] photorefractive keratectomy; LASIK laser assisted in situ keratomileusis; HTN hypertension; DM diabetes mellitus; COPD chronic obstructive pulmonary disease

## 2021-05-08 NOTE — Assessment & Plan Note (Signed)
CSME OD has recurred somewhat at 5-week interval today, will need repeat injection Avastin.  Also some 3 weeks post recent additional PRP and attempt to decrease vegF burden.

## 2021-05-23 ENCOUNTER — Encounter: Payer: Medicare Other | Admitting: Internal Medicine

## 2021-06-02 ENCOUNTER — Other Ambulatory Visit: Payer: Self-pay | Admitting: Internal Medicine

## 2021-06-02 NOTE — Telephone Encounter (Signed)
Requested medication (s) are due for refill today: yes  Requested medication (s) are on the active medication list: yes  Last refill:  05/02/21 #90  Future visit scheduled: yes  Notes to clinic:  instructions on last RF "Must have office visit for refills"   Cardiovascular:  Vasodilators Failed 06/02/2021 12:04 PM  Protocol Details  HCT in normal range and within 360 days   HGB in normal range and within 360 days   RBC in normal range and within 360 days      Requested Prescriptions  Pending Prescriptions Disp Refills   hydrALAZINE (APRESOLINE) 50 MG tablet [Pharmacy Med Name: HYDRALAZINE 50 MG TABLET] 90 tablet 0    Sig: TAKE 1 TABLET BY MOUTH THREE TIMES A DAY (STOP THE CARVEDILOL)     Cardiovascular:  Vasodilators Failed - 06/02/2021 12:04 PM      Failed - HCT in normal range and within 360 days    HCT  Date Value Ref Range Status  12/29/2020 27.4 (L) 39.0 - 52.0 % Final   Hematocrit  Date Value Ref Range Status  11/16/2020 31.1 (L) 37.5 - 51.0 % Final          Failed - HGB in normal range and within 360 days    Hemoglobin  Date Value Ref Range Status  12/29/2020 8.7 (L) 13.0 - 17.0 g/dL Final  11/16/2020 10.3 (L) 13.0 - 17.7 g/dL Final          Failed - RBC in normal range and within 360 days    RBC  Date Value Ref Range Status  12/29/2020 3.22 (L) 4.22 - 5.81 MIL/uL Final          Passed - WBC in normal range and within 360 days    WBC  Date Value Ref Range Status  12/29/2020 7.4 4.0 - 10.5 K/uL Final          Passed - PLT in normal range and within 360 days    Platelets  Date Value Ref Range Status  12/29/2020 196 150 - 400 K/uL Final  11/16/2020 161 150 - 450 x10E3/uL Final          Passed - Last BP in normal range    BP Readings from Last 1 Encounters:  12/30/20 122/70          Passed - Valid encounter within last 12 months    Recent Outpatient Visits           6 months ago Type 2 diabetes mellitus with diabetic polyneuropathy, with  long-term current use of insulin ([place])   [place] Ladell Pier, MD   10 months ago Hospital discharge follow-up   [place], Deborah B, MD   1 year ago Type 2 diabetes mellitus with diabetic polyneuropathy, with long-term current use of insulin ([place])   [place], Deborah B, MD   1 year ago DM type 2 with diabetic peripheral neuropathy [facility])   [place], [place], [place]   2 years ago DM type 2 with diabetic peripheral neuropathy [facility] [place] [facility])   [place], [place]       Future Appointments             In 2 weeks Ladell Pier, MD [place]

## 2021-06-12 ENCOUNTER — Encounter (INDEPENDENT_AMBULATORY_CARE_PROVIDER_SITE_OTHER): Payer: Medicare Other | Admitting: Ophthalmology

## 2021-06-13 ENCOUNTER — Ambulatory Visit (INDEPENDENT_AMBULATORY_CARE_PROVIDER_SITE_OTHER): Payer: Medicare Other | Admitting: Ophthalmology

## 2021-06-13 ENCOUNTER — Encounter (INDEPENDENT_AMBULATORY_CARE_PROVIDER_SITE_OTHER): Payer: Self-pay | Admitting: Ophthalmology

## 2021-06-13 ENCOUNTER — Other Ambulatory Visit: Payer: Self-pay

## 2021-06-13 DIAGNOSIS — H4051X3 Glaucoma secondary to other eye disorders, right eye, severe stage: Secondary | ICD-10-CM | POA: Diagnosis not present

## 2021-06-13 DIAGNOSIS — E113511 Type 2 diabetes mellitus with proliferative diabetic retinopathy with macular edema, right eye: Secondary | ICD-10-CM | POA: Diagnosis not present

## 2021-06-13 DIAGNOSIS — H211X1 Other vascular disorders of iris and ciliary body, right eye: Secondary | ICD-10-CM | POA: Diagnosis not present

## 2021-06-13 MED ORDER — BEVACIZUMAB 2.5 MG/0.1ML IZ SOSY
2.5000 mg | PREFILLED_SYRINGE | INTRAVITREAL | Status: AC | PRN
  Administered 2021-06-13: 2.5 mg via INTRAVITREAL

## 2021-06-13 NOTE — Assessment & Plan Note (Signed)
OD remained stable

## 2021-06-13 NOTE — Progress Notes (Signed)
06/13/2021     CHIEF COMPLAINT Patient presents for  Chief Complaint  Patient presents with   Retina Follow Up      HISTORY OF PRESENT ILLNESS: Derek Lowery is a 65 y.o. male who presents to the clinic today for:   HPI     Retina Follow Up   Patient presents with  Diabetic Retinopathy.  In right eye.  This started 5 weeks ago.  Severity is mild.  Duration of 5 weeks.  Since onset it is stable.        Comments   5 week fu OD oct avastin OD, no dilation- Dr [name] to check pupils and iris prior dilation. Patient states vision is stable and unchanged since last visit. Denies any new floaters or FOL. Pt uses Latanoprost OD qhs, timolol bid PD, dorzolamide BID OD, brimonidine bid OD, Rhopressa qhs OD.      Last edited by Laurin Coder on 06/13/2021  9:03 AM.      Referring physician: Ladell Pier, MD [place],  [place] [id number]  HISTORICAL INFORMATION:   Selected notes from the MEDICAL RECORD NUMBER    Lab Results  Component Value Date   HGBA1C 8.8 (A) 11/16/2020     CURRENT MEDICATIONS: Current Outpatient Medications (Ophthalmic Drugs)  Medication Sig   brimonidine (ALPHAGAN) 0.2 % ophthalmic solution Place 1 drop into the right eye 2 (two) times daily.    dorzolamide (TRUSOPT) 2 % ophthalmic solution Place 1 drop into the right eye 2 (two) times daily.   latanoprost (XALATAN) 0.005 % ophthalmic solution Place 1 drop into the right eye at bedtime.   RHOPRESSA 0.02 % SOLN Place 1 drop into the right eye at bedtime.   timolol (BETIMOL) 0.5 % ophthalmic solution Place 1 drop into the right eye 2 (two) times daily.   No current facility-administered medications for this visit. (Ophthalmic Drugs)   Current Outpatient Medications (Other)  Medication Sig   amLODipine (NORVASC) 10 MG tablet Take 1 tablet (10 mg total) by mouth daily.   ammonium lactate (AMLACTIN) 12 % lotion Apply to both feet twice daily for dry skin.   aspirin EC 81  MG tablet Take 1 tablet (81 mg total) by mouth daily.   atorvastatin (LIPITOR) 40 MG tablet TAKE 1 TABLET BY MOUTH EVERY DAY   BD PEN NEEDLE NANO 2ND GEN 32G X 4 MM MISC USE AS DIRECTED 3 TIMES A DAY. PLEASE SCHEDULE AN APPOINTMENT FOR ADDITIONAL REFILLS.   blood glucose meter kit and supplies KIT Dispense based on patient and insurance preference. Use up to four times daily as directed. One Touch Verio   Continuous Blood Gluc Receiver (DEXCOM G6 RECEIVER) DEVI 1 Device by Does not apply route daily.   Continuous Blood Gluc Receiver (DEXCOM G6 RECEIVER) DEVI 1 Device by Does not apply route daily.   Continuous Blood Gluc Receiver (FREESTYLE LIBRE 14 DAY READER) DEVI USE AS DIRECTED   Continuous Blood Gluc Sensor (DEXCOM G6 SENSOR) MISC 1 packet by Does not apply route daily.   Continuous Blood Gluc Sensor (FREESTYLE LIBRE SENSOR SYSTEM) MISC Change sensor Q 2 wks   Continuous Blood Gluc Transmit (DEXCOM G6 TRANSMITTER) MISC 1 packet by Does not apply route daily.   ferrous gluconate (FERGON) 324 MG tablet Take 1 tablet (324 mg total) by mouth daily with breakfast.   glucose blood (ACCU-CHEK AVIVA PLUS) test strip Use as instructed for 3 times daily testing of blood sugar. E11.9  glucose monitoring kit (FREESTYLE) monitoring kit 1 each by Does not apply route as needed for other.   hydrALAZINE (APRESOLINE) 50 MG tablet TAKE 1 TABLET BY MOUTH THREE TIMES A DAY (STOP THE CARVEDILOL)   hydrocortisone (ANUSOL-HC) 2.5 % rectal cream Place 1 application rectally 2 (two) times daily as needed for hemorrhoids or anal itching (for flare of hemorrhoids).   insulin glargine (LANTUS SOLOSTAR) 100 UNIT/ML Solostar Pen Inject 38 Units into the skin at bedtime.   insulin lispro (HUMALOG KWIKPEN) 100 UNIT/ML KwikPen Inject 10 Units into the skin 3 (three) times daily with meals.   Lancets MISC Use as directed.  Accu chek 2   Multiple Vitamins-Minerals (MULTIVITAMIN WITH MINERALS) tablet Take 1 tablet by mouth  daily.   torsemide (DEMADEX) 20 MG tablet TAKE 2 TABLETS (40 MG TOTAL) BY MOUTH EVERY DAY   No current facility-administered medications for this visit. (Other)      REVIEW OF SYSTEMS:    ALLERGIES Allergies  Allergen Reactions   Coreg [Carvedilol] Other (See Comments)    Redness and swelling of calf    PAST MEDICAL HISTORY Past Medical History:  Diagnosis Date   Arthritis    CAD (coronary artery disease)    NSTEMI 06/2011:  LHC 07/18/11: Proximal diagonal 60%, distal LAD with a diabetic appearance and 60% stenosis, OM2 with an occluded superior branch and an inferior branch with 90%, EF 55% with inferior hypokinesis.  PCI: Promus DES to the OM2 inferior branch.  This vessel provides collaterals to the superior branch which remained occluded.  Echocardiogram 07/18/11: EF 60%, normal wall motion.   Cataract    Essential hypertension, benign    Glaucoma    Hypercholesteremia    Internal hemorrhoids    Noncompliance    Type 2 diabetes mellitus ([place]) 1990   Past Surgical History:  Procedure Laterality Date   CATARACT EXTRACTION Right 2019   Dr. Katy Fitch   CHOLECYSTECTOMY N/A 09/21/2017   Procedure: LAPAROSCOPIC CHOLECYSTECTOMY WITH INTRAOPERATIVE CHOLANGIOGRAM;  Surgeon: Michael Boston, MD;  Location: [facility];  Service: General;  Laterality: N/A;   COLONOSCOPY  2000   Dr. Collene Mares   EYE SURGERY     IRRIGATION AND DEBRIDEMENT ABSCESS Left 05/05/2014   Procedure: IRRIGATION AND DEBRIDEMENT ABSCESS left buttock;  Surgeon: Michael Boston, MD;  Location: [facility];  Service: General;  Laterality: Left;   LEFT HEART CATHETERIZATION WITH CORONARY ANGIOGRAM N/A 07/18/2011   Procedure: LEFT HEART CATHETERIZATION WITH CORONARY ANGIOGRAM;  Surgeon: Hillary Bow, MD;  Location: [facility] CATH LAB;  Service: Cardiovascular;  Laterality: N/A;   PERCUTANEOUS CORONARY STENT INTERVENTION (PCI-S)  07/18/2011   Procedure: PERCUTANEOUS CORONARY STENT INTERVENTION (PCI-S);  Surgeon: Hillary Bow, MD;   Location: [facility] C[facility] LAB;  Service: Cardiovascular;;    FAMILY HISTORY Family History  Problem Relation Age of Onset   Coronary artery disease Father        Developed in his 83s   Kidney disease Father    Diabetes Father    Melanoma Father    Rectal cancer Father    Heart failure Mother    Hypertension Mother    Diabetes Sister    Diabetes Brother    Colon cancer Neg Hx    Esophageal cancer Neg Hx    Stomach cancer Neg Hx     SOCIAL HISTORY Social History   Tobacco Use   Smoking status: Never   Smokeless tobacco: Never  Vaping Use   Vaping Use: Never used  Substance Use Topics  Alcohol use: No   Drug use: No         OPHTHALMIC EXAM:  Base Eye Exam     Visual Acuity (ETDRS)       Right Left   Dist [place] 20/30 NLP         Tonometry (Tonopen, 8:59 AM)       Right Left   Pressure 7 31         Pupils       Dark Shape React APD   Right  Irregular  None   Left 3 Round Minimal +1         Extraocular Movement       Right Left    Full Full         Neuro/Psych     Oriented x3: Yes   Mood/Affect: Normal         Dilation   No dilation until Dr [name] sees patient.          Slit Lamp and Fundus Exam     External Exam       Right Left   External Normal Normal         Slit Lamp Exam       Right Left   Lids/Lashes Normal Normal   Conjunctiva/Sclera White and quiet, no exposure of tube White and quiet   Cornea Clear Clear   Anterior Chamber ,, Tube 12 meridian Deep   Iris Old NVI large trunks, no signs of new  NVI, updrawn pupil, 3.5 mm Old neovascularization of the iris 360 degrees, stable nonprogressive   Lens Posterior chamber intraocular lens 3.5+ Nuclear sclerosis   Anterior Vitreous Normal             IMAGING AND PROCEDURES  Imaging and Procedures for 06/13/21  OCT, Retina - OU - Both Eyes       Right Eye Quality was good. Scan locations included subfoveal. Central Foveal Thickness: 281. Progression has been  stable. Findings include cystoid macular edema.   Notes Chronic CSME, slightly increased now some 5 weeks post recent Avastin and .  Center thickness of the fovea OD decrease, repeat injection Avastin OD today     Intravitreal Injection, Pharmacologic Agent - OD - Right Eye       Time Out 06/13/2021. 9:23 AM. Confirmed correct patient, procedure, site, and patient consented.   Anesthesia Topical anesthesia was used. Anesthetic medications included Lidocaine 4%.   Procedure Preparation included Tobramycin 0.3%, 10% betadine to eyelids, 5% betadine to ocular surface. A 30 gauge needle was used.   Injection: 2.5 mg bevacizumab 2.5 MG/0.1ML   Route: Intravitreal, Site: Right Eye   NDC: [phone]   Post-op Post injection exam found visual acuity of at least counting fingers. The patient tolerated the procedure well. There were no complications. The patient received written and verbal post procedure care education. Post injection medications included ocuflox.              ASSESSMENT/PLAN:  Rubeosis iridis of right eye OD, history of chronic recurrences controlled only with intravitreal Avastin.  Proliferative diabetic retinopathy of right eye with macular edema associated with type 2 diabetes mellitus (HCC) OD, less active PDR post injection Avastin but also post peripheral PRP more recently.  Macular edema has diminished after last injection of Avastin 5 weeks previous.  Repeat injection today to control diabetic CSME but also prevent rubeosis progression and this monocular patient  Neovascular glaucoma, right eye, severe stage OD remained stable  ICD-10-CM   1. Proliferative diabetic retinopathy of right eye with macular edema associated with type 2 diabetes mellitus (HCC)  E11.3511 OCT, Retina - OU - Both Eyes    Intravitreal Injection, Pharmacologic Agent - OD - Right Eye    bevacizumab (AVASTIN) SOSY 2.5 mg    2. Rubeosis iridis of right eye  H21.1X1     3.  Neovascular glaucoma, right eye, severe stage  H40.51X3       1.  OD looks great, stable overall.  No signs of progression of iris rubeosis seen on Dr. Dahlia Bailiff undilated examination today.  Excellent acuity.  OCT with less thickening.  Repeat injection Avastin today to control diabetic CSME but also to prevent recurrent progression of iris rubeosis in an eye with known neovascular glaucoma in the past  2.  3.  Ophthalmic Meds Ordered this visit:  Meds ordered this encounter  Medications   bevacizumab (AVASTIN) SOSY 2.5 mg       Return in about 5 weeks (around 07/18/2021) for dilate, OD, AVASTIN OCT.  There are no Patient Instructions on file for this visit.   Explained the diagnoses, plan, and follow up with the patient and they expressed understanding.  Patient expressed understanding of the importance of proper follow up care.   Clent Demark Laronica Bhagat M.D. Diseases & Surgery of the Retina and Vitreous [facility] [place] 06/13/21     Abbreviations: M myopia (nearsighted); A astigmatism; H hyperopia (farsighted); P presbyopia; Mrx spectacle prescription;  CTL contact lenses; OD right eye; OS left eye; OU both eyes  XT exotropia; ET esotropia; PEK punctate epithelial keratitis; PEE punctate epithelial erosions; DES dry eye syndrome; MGD meibomian gland dysfunction; ATs artificial tears; PFAT's preservative free artificial tears; [place] nuclear sclerotic cataract; PSC posterior subcapsular cataract; ERM epi-retinal membrane; PVD posterior vitreous detachment; RD retinal detachment; DM diabetes mellitus; DR diabetic retinopathy; NPDR non-proliferative diabetic retinopathy; PDR proliferative diabetic retinopathy; CSME clinically significant macular edema; DME diabetic macular edema; dbh dot blot hemorrhages; CWS cotton wool spot; POAG primary open angle glaucoma; C/D cup-to-disc ratio; HVF humphrey visual field; GVF goldmann visual field; OCT optical coherence tomography; IOP  intraocular pressure; BRVO Branch retinal vein occlusion; CRVO central retinal vein occlusion; CRAO central retinal artery occlusion; BRAO branch retinal artery occlusion; RT retinal tear; SB scleral buckle; PPV pars plana vitrectomy; VH Vitreous hemorrhage; PRP panretinal laser photocoagulation; IVK intravitreal kenalog; VMT vitreomacular traction; MH Macular hole;  NVD neovascularization of the disc; NVE neovascularization elsewhere; AREDS age related eye disease study; ARMD age related macular degeneration; POAG primary open angle glaucoma; EBMD epithelial/anterior basement membrane dystrophy; ACIOL anterior chamber intraocular lens; IOL intraocular lens; PCIOL posterior chamber intraocular lens; Phaco/IOL phacoemulsification with intraocular lens placement; [place] photorefractive keratectomy; LASIK laser assisted in situ keratomileusis; HTN hypertension; DM diabetes mellitus; COPD chronic obstructive pulmonary disease

## 2021-06-13 NOTE — Assessment & Plan Note (Signed)
OD, less active PDR post injection Avastin but also post peripheral PRP more recently.  Macular edema has diminished after last injection of Avastin 5 weeks previous.  Repeat injection today to control diabetic CSME but also prevent rubeosis progression and this monocular patient

## 2021-06-13 NOTE — Assessment & Plan Note (Signed)
OD, history of chronic recurrences controlled only with intravitreal Avastin.

## 2021-06-17 ENCOUNTER — Other Ambulatory Visit: Payer: Self-pay

## 2021-06-17 ENCOUNTER — Encounter: Payer: Self-pay | Admitting: Internal Medicine

## 2021-06-17 ENCOUNTER — Ambulatory Visit: Payer: Medicare Other | Attending: Internal Medicine | Admitting: Internal Medicine

## 2021-06-17 VITALS — BP 153/75 | HR 67 | Resp 16 | Wt 254.2 lb

## 2021-06-17 DIAGNOSIS — Z794 Long term (current) use of insulin: Secondary | ICD-10-CM

## 2021-06-17 DIAGNOSIS — N1831 Chronic kidney disease, stage 3a: Secondary | ICD-10-CM

## 2021-06-17 DIAGNOSIS — E1142 Type 2 diabetes mellitus with diabetic polyneuropathy: Secondary | ICD-10-CM

## 2021-06-17 DIAGNOSIS — Z23 Encounter for immunization: Secondary | ICD-10-CM | POA: Diagnosis not present

## 2021-06-17 DIAGNOSIS — I2510 Atherosclerotic heart disease of native coronary artery without angina pectoris: Secondary | ICD-10-CM | POA: Diagnosis not present

## 2021-06-17 DIAGNOSIS — I5032 Chronic diastolic (congestive) heart failure: Secondary | ICD-10-CM

## 2021-06-17 DIAGNOSIS — I10 Essential (primary) hypertension: Secondary | ICD-10-CM | POA: Diagnosis not present

## 2021-06-17 LAB — GLUCOSE, POCT (MANUAL RESULT ENTRY): POC Glucose: 154 mg/dl — AB (ref 70–99)

## 2021-06-17 LAB — POCT GLYCOSYLATED HEMOGLOBIN (HGB A1C): HbA1c, POC (controlled diabetic range): 7.1 % — AB (ref 0.0–7.0)

## 2021-06-17 MED ORDER — DEXCOM G6 SENSOR MISC: 1.0000 | Freq: Every day | 11 refills | Status: DC

## 2021-06-17 MED ORDER — TORSEMIDE 20 MG PO TABS: ORAL_TABLET | ORAL | 6 refills | Status: DC

## 2021-06-17 MED ORDER — HYDRALAZINE HCL 50 MG PO TABS
75.0000 mg | ORAL_TABLET | Freq: Three times a day (TID) | ORAL | 6 refills | Status: DC
Start: 2021-06-17 — End: 2021-09-16

## 2021-06-17 MED ORDER — AMLODIPINE BESYLATE 10 MG PO TABS: 10.0000 mg | ORAL_TABLET | Freq: Every day | ORAL | 3 refills | Status: DC

## 2021-06-17 MED ORDER — DEXCOM G6 RECEIVER DEVI
1.0000 | Freq: Every day | 0 refills | Status: DC
Start: 2021-06-17 — End: 2021-09-17

## 2021-06-17 NOTE — Patient Instructions (Signed)
Increase hydralazine to 50 mg 1-1/2 tablets 3 times a day.

## 2021-06-17 NOTE — Progress Notes (Signed)
Patient ID: Derek Lowery, male    DOB: 07-08-1956  MRN: [id number]  CC: Diabetes and Hypertension   Subjective: Derek Lowery is a 65 y.o. male who presents for chronic ds management His concerns today include:  hx of DM type 2 with retinopathy(laser treatments by Dr. [name]), peripheral neuropathy, and microalbuminuria, HTN, CAD with stent OM2 in 7829, diastolic CHF (EF 56-21% 10/863), glaucoma (blind LT eye) and HL.   Did not bring meds with him today.   DM/Obesity: Results for orders placed or performed in visit on 06/17/21  POCT glucose (manual entry)  Result Value Ref Range   POC Glucose 154 (A) 70 - 99 mg/dl  POCT glycosylated hemoglobin (Hb A1C)  Result Value Ref Range   Hemoglobin A1C     HbA1c POC (<> result, manual entry)     HbA1c, POC (prediabetic range)     HbA1c, POC (controlled diabetic range) 7.1 (A) 0.0 - 7.0 %  Never got Dexcom or Libre.  Not checking BS because he can not see # on his current glucometer due to retinopathy Still on Lantus 38 units daily and Humalog 10 units TID with meals Lives with his sister who does the cooking.  No eating fired foods.  Drinks water and occasional diet sodas.  HYPERTENSION/CAD/CKD 3 Currently taking: see medication list.  After hospitalization in May of this year, with CHF, furosemide was changed to torsemide and lisinopril was discontinued.  Currently on hydralazine, Norvasc and torsemide.  GFR has remained in the 40s. Med Adherence: _0  Yes but sometimes forgets mid-day dose of Hydralazine   Medication side effects: _1  Yes    _2  No Adherence with salt restriction: _3  Yes    _4  No Home Monitoring?: _5  Yes    _6  No but does have device Monitoring Frequency:  Home BP results range:  SOB? _7  Yes    _8  No Chest Pain?: _9  Yes    _10  No Leg swelling?: _11  Yes  -during the day  _12  No Headaches?: _13  Yes    _14  No Dizziness? _15  Yes    _16  No Comments:   HL:  taking and tolerating Lipitor Patient Active Problem List    Diagnosis Date Noted   Neovascular glaucoma, right eye, severe stage 06/13/2021   Acute exacerbation of CHF (congestive heart failure) ([place]) 12/24/2020   CKD (chronic kidney disease), stage III ([place]) 12/24/2020   Pneumatouria 06/28/2020   Acute respiratory failure with hypoxia ([place]) 06/28/2020   Pneumonia due to COVID-19 virus 06/28/2020   Proliferative diabetic retinopathy of right eye with macular edema associated with type 2 diabetes mellitus ([place]) 11/30/2019   Rubeosis iridis of right eye 11/30/2019   Diabetic macular edema of right eye with proliferative retinopathy associated with type 2 diabetes mellitus ([place]) 11/30/2019   Glaucoma with pupillary seclusion, unspecified laterality, severe stage 11/30/2019   Trigger middle finger of right hand 10/17/2019   Pain due to onychomycosis of toenails of both feet 08/07/2019   Educated about COVID-19 virus infection 12/02/2018   RBBB 12/02/2018   Benign paroxysmal positional vertigo 06/25/2018   Pre-ulcerative corn or callous 10/13/2017   Anemia 10/13/2017   Chronic diarrhea 06/09/2017   Microalbuminuria 03/07/2017   Dry skin 03/05/2017   Glaucoma 03/05/2017   Legally blind 03/05/2017   Retinopathy due to secondary diabetes ([place]) 02/06/2016   Diabetic polyneuropathy associated with type 2 diabetes mellitus ([place]) 01/12/2015   Fournier's gangrene into true pelvis s/p I&D 05/05/2014 05/05/2014   Type 2 diabetes mellitus  with complication, with long-term current use of insulin ([place]) 07/17/2011   HLD (hyperlipidemia) 07/17/2011   Coronary artery disease of native artery of native heart with stable angina pectoris ([place]) 07/17/2011   Essential hypertension 07/17/2011     Current Outpatient Medications on File Prior to Visit  Medication Sig Dispense Refill   ammonium lactate (AMLACTIN) 12 % lotion Apply to both feet twice daily for dry skin. 400 g 6   aspirin EC 81 MG tablet Take 1 tablet (81 mg total) by mouth daily. 30 tablet 4    atorvastatin (LIPITOR) 40 MG tablet TAKE 1 TABLET BY MOUTH EVERY DAY 90 tablet 1   BD PEN NEEDLE NANO 2ND GEN 32G X 4 MM MISC USE AS DIRECTED 3 TIMES A DAY. PLEASE SCHEDULE AN APPOINTMENT FOR ADDITIONAL REFILLS. 100 each 3   blood glucose meter kit and supplies KIT Dispense based on patient and insurance preference. Use up to four times daily as directed. One Touch Verio 1 each 0   brimonidine (ALPHAGAN) 0.2 % ophthalmic solution Place 1 drop into the right eye 2 (two) times daily.      Continuous Blood Gluc Receiver (FREESTYLE LIBRE 14 DAY READER) DEVI USE AS DIRECTED 1 each 2   Continuous Blood Gluc Sensor (FREESTYLE LIBRE SENSOR SYSTEM) MISC Change sensor Q 2 wks 2 each 12   dorzolamide (TRUSOPT) 2 % ophthalmic solution Place 1 drop into the right eye 2 (two) times daily.     glucose blood (ACCU-CHEK AVIVA PLUS) test strip Use as instructed for 3 times daily testing of blood sugar. E11.9 100 each 6   glucose monitoring kit (FREESTYLE) monitoring kit 1 each by Does not apply route as needed for other. 1 each 0   hydrocortisone (ANUSOL-HC) 2.5 % rectal cream Place 1 application rectally 2 (two) times daily as needed for hemorrhoids or anal itching (for flare of hemorrhoids). 30 g 1   insulin glargine (LANTUS SOLOSTAR) 100 UNIT/ML Solostar Pen Inject 38 Units into the skin at bedtime. 15 mL 6   insulin lispro (HUMALOG KWIKPEN) 100 UNIT/ML KwikPen Inject 10 Units into the skin 3 (three) times daily with meals. 15 mL 6   Lancets MISC Use as directed.  Accu chek 2 100 each 11   latanoprost (XALATAN) 0.005 % ophthalmic solution Place 1 drop into the right eye at bedtime.     Multiple Vitamins-Minerals (MULTIVITAMIN WITH MINERALS) tablet Take 1 tablet by mouth daily.     RHOPRESSA 0.02 % SOLN Place 1 drop into the right eye at bedtime.     timolol (BETIMOL) 0.5 % ophthalmic solution Place 1 drop into the right eye 2 (two) times daily.     No current facility-administered medications on file prior to  visit.    Allergies  Allergen Reactions   Coreg [Carvedilol] Other (See Comments)    Redness and swelling of calf    Social History   Socioeconomic History   Marital status: Single    Spouse name: Not on file   Number of children: Not on file   Years of education: Not on file   Highest education level: Not on file  Occupational History   Occupation: [place]    Employer: [employer]  Tobacco Use   Smoking status: Never   Smokeless tobacco: Never  Vaping Use   Vaping Use: Never used  Substance and Sexual Activity   Alcohol use: No   Drug use: No   Sexual activity: Never  Other Topics Concern   Not  on file  Social History Narrative   Works at [employer]   [phone] [employer] Strain: Low Risk    Difficulty of Paying Living Expenses: Not hard at all  Food Insecurity: No Food Insecurity   Worried About Charity fundraiser in the Last Year: Never true   Arboriculturist in the Last Year: Never true  Transportation Needs: No Transportation Needs   Lack of Transportation (Medical): No   Lack of Transportation (Non-Medical): No  Physical Activity: Inactive   Days of Exercise per Week: 0 days   Minutes of Exercise per Session: 0 min  Stress: Stress Concern Present   Feeling of Stress : To some extent  Social Connections: Socially Isolated   Frequency of Communication with Friends and Family: Never   Frequency of Social Gatherings with Friends and Family: More than three times a week   Attends Religious Services: Never   Marine scientist or Organizations: No   Attends Music therapist: Never   Marital Status: Never married  Human resources officer Violence: Not At Risk   Fear of Current or Ex-Partner: No   Emotionally Abused: No   Physically Abused: No   Sexually Abused: No    Family History  Problem Relation Age of Onset   Coronary artery disease Father        Developed in his 8s   Kidney  disease Father    Diabetes Father    Melanoma Father    Rectal cancer Father    Heart failure Mother    Hypertension Mother    Diabetes Sister    Diabetes Brother    Colon cancer Neg Hx    Esophageal cancer Neg Hx    Stomach cancer Neg Hx     Past Surgical History:  Procedure Laterality Date   CATARACT EXTRACTION Right 2019   Dr. Katy Fitch   CHOLECYSTECTOMY N/A 09/21/2017   Procedure: LAPAROSCOPIC CHOLECYSTECTOMY WITH INTRAOPERATIVE CHOLANGIOGRAM;  Surgeon: Michael Boston, MD;  Location: [facility];  Service: General;  Laterality: N/A;   COLONOSCOPY  2000   Dr. Collene Mares   EYE SURGERY     IRRIGATION AND DEBRIDEMENT ABSCESS Left 05/05/2014   Procedure: IRRIGATION AND DEBRIDEMENT ABSCESS left buttock;  Surgeon: Michael Boston, MD;  Location: [facility];  Service: General;  Laterality: Left;   LEFT HEART CATHETERIZATION WITH CORONARY ANGIOGRAM N/A 07/18/2011   Procedure: LEFT HEART CATHETERIZATION WITH CORONARY ANGIOGRAM;  Surgeon: Hillary Bow, MD;  Location[facility] LAB;  Service: Cardiovascular;  Laterality: N/A;   PERCUTANEOUS CORONARY STENT INTERVENTION (PCI-S)  07/18/2011   Procedure: PERCUTANEOUS CORONARY STENT INTERVENTION (PCI-S);  Surgeon: Hillary Bow, MD;  Location: [facility] CATH LAB;  Service: Cardiovascular;;    ROS: Review of Systems Negative except as stated above  PHYSICAL EXAM: BP (!) 153/75   Pulse 67   Resp 16   Wt 254 lb 3.2 oz (115.3 kg)   SpO2 97%   BMI 36.47 kg/m   Wt Readings from Last 3 Encounters:  06/17/21 254 lb 3.2 oz (115.3 kg)  12/30/20 250 lb (113.4 kg)  11/16/20 261 lb 12.8 oz (118.8 kg)    Physical Exam   General appearance - alert, well appearing, and in no distress Mental status - normal mood, behavior, speech, dress, motor activity, and thought processes Eyes - pupils equal and reactive, extraocular eye movements intact Neck - supple, no significant adenopathy Chest - clear to auscultation, no  wheezes, rales or rhonchi, symmetric air entry Heart -  normal rate, regular rhythm, normal S1, S2, no murmurs, rubs, clicks or gallops Extremities -mild pedal edema.  Nonpitting edema of the lower legs. Skin -skin on both lower legs  and feet very dry with cobblestoning appearance Diabetic Foot Exam - Simple   Simple Foot Form Visual Inspection See comments: Yes Sensation Testing See comments: Yes Pulse Check Posterior Tibialis and Dorsalis pulse intact bilaterally: Yes Comments Dorsal surface and plantar surface skin very dry and cracked.  Hypopigmented changes between the toes.  Poor foot hygiene.  Toenails are thick, discolored and overgrown.  Decreased sensation on deep exam on both feet.     CMP Latest Ref Rng & Units 12/30/2020 12/29/2020 12/28/2020  Glucose 70 - 99 mg/dL 181(H) 120(H) 70  BUN 8 - 23 mg/dL 32(H) 34(H) 31(H)  Creatinine 0.61 - 1.24 mg/dL 1.59(H) 1.72(H) 1.63(H)  Sodium 135 - 145 mmol/L 140 141 140  Potassium 3.5 - 5.1 mmol/L 3.7 4.1 4.2  Chloride 98 - 111 mmol/L 105 103 103  CO2 22 - 32 mmol/L _0 Calcium 8.9 - 10.3 mg/dL 8.4(L) 8.9 8.9  Total Protein 6.5 - 8.1 g/dL - - -  Total Bilirubin 0.3 - 1.2 mg/dL - - -  Alkaline Phos 38 - 126 U/L - - -  AST 15 - 41 U/L - - -  ALT 0 - 44 U/L - - -   Lipid Panel     Component Value Date/Time   CHOL 124 11/16/2020 1653   TRIG 145 11/16/2020 1653   HDL 36 (L) 11/16/2020 1653   CHOLHDL 3.4 11/16/2020 1653   CHOLHDL 3.2 11/29/2015 0936   VLDL 19 11/29/2015 0936   LDLCALC 63 11/16/2020 1653    CBC    Component Value Date/Time   WBC 7.4 12/29/2020 0504   RBC 3.22 (L) 12/29/2020 0504   HGB 8.7 (L) 12/29/2020 0504   HGB 10.3 (L) 11/16/2020 1653   HCT 27.4 (L) 12/29/2020 0504   HCT 31.1 (L) 11/16/2020 1653   PLT 196 12/29/2020 0504   PLT 161 11/16/2020 1653   MCV 85.1 12/29/2020 0504   MCV 85 11/16/2020 1653   MCH 27.0 12/29/2020 0504   MCHC 31.8 12/29/2020 0504   RDW 14.3 12/29/2020 0504   RDW 13.2 11/16/2020 1653   LYMPHSABS 0.9 12/24/2020 2010    LYMPHSABS 1.8 07/12/2019 1042   MONOABS 0.8 12/24/2020 2010   EOSABS 0.2 12/24/2020 2010   EOSABS 0.3 07/12/2019 1042   BASOSABS 0.1 12/24/2020 2010   BASOSABS 0.1 07/12/2019 1042    ASSESSMENT AND PLAN: 1. Type 2 diabetes mellitus with diabetic polyneuropathy, with long-term current use of insulin (HCC) A1c improved and closer to goal.  I will have our clinical pharmacist try to problem solve getting him a Dexcom or libre device. Continue current dose of Lantus and Humalog insulins.  Discussed and encourage healthy eating habits.  I will refer him to his podiatrist Dr. Adah Perl for diabetic foot care. - POCT glucose (manual entry) - POCT glycosylated hemoglobin (Hb A1C) - Continuous Blood Gluc Receiver (DEXCOM G6 RECEIVER) DEVI; 1 Device by Does not apply route daily.  Dispense: 1 each; Refill: 0 - Continuous Blood Gluc Sensor (DEXCOM G6 SENSOR) MISC; 1 packet by Does not apply route daily.  Dispense: 3 each; Refill: 11 - CBC - Comprehensive metabolic panel - Microalbumin / creatinine urine ratio - Ambulatory referral to Podiatry  2. Essential hypertension Not at goal.  Increase hydralazine to 75 mg 3 times a day. - amLODipine (NORVASC) 10 MG tablet; Take 1 tablet (10 mg total) by mouth daily.  Dispense: 90 tablet; Refill: 3 - hydrALAZINE (APRESOLINE) 50 MG tablet; Take 1.5 tablets (75 mg total) by mouth 3 (three) times daily.  Dispense: 135 tablet; Refill: 6  3. Coronary artery disease involving native coronary artery of native heart without angina pectoris Stable.  Continue aspirin and atorvastatin. - Lipid panel  4. Chronic diastolic CHF (congestive heart failure) (HCC) Clinically compensated.  Continue torsemide and low-salt diet. - torsemide (DEMADEX) 20 MG tablet; TAKE 2 TABLETS (40 MG TOTAL) BY MOUTH EVERY DAY  Dispense: 60 tablet; Refill: 6  5. Stage 3a chronic kidney disease (HCC) We will continue to monitor kidney function that is affected by diagnosis of CHF.  Patient  advised to avoid NSAIDs.  Discussed the importance of good blood pressure control.  6. Need for immunization against influenza  - Flu Vaccine QUAD 34moIM (Fluarix, Fluzone & Alfiuria Quad PF)  7. Need for vaccination against Streptococcus pneumoniae - Pneumococcal conjugate vaccine 20-valent     Patient was given the opportunity to ask questions.  Patient verbalized understanding of the plan and was able to repeat key elements of the plan.   Orders Placed This Encounter  Procedures   Flu Vaccine QUAD 676moM (Fluarix, Fluzone & Alfiuria Quad PF)   Pneumococcal conjugate vaccine 20-valent   CBC   Comprehensive metabolic panel   Lipid panel   Microalbumin / creatinine urine ratio   Ambulatory referral to Podiatry   POCT glucose (manual entry)   POCT glycosylated hemoglobin (Hb A1C)     Requested Prescriptions   Signed Prescriptions Disp Refills   torsemide (DEMADEX) 20 MG tablet 60 tablet 6    Sig: TAKE 2 TABLETS (40 MG TOTAL) BY MOUTH EVERY DAY   Continuous Blood Gluc Receiver (DEXCOM G6 RECEIVER) DEVI 1 each 0    Sig: 1 Device by Does not apply route daily.   Continuous Blood Gluc Sensor (DEXCOM G6 SENSOR) MISC 3 each 11    Sig: 1 packet by Does not apply route daily.   amLODipine (NORVASC) 10 MG tablet 90 tablet 3    Sig: Take 1 tablet (10 mg total) by mouth daily.   hydrALAZINE (APRESOLINE) 50 MG tablet 135 tablet 6    Sig: Take 1.5 tablets (75 mg total) by mouth 3 (three) times daily.    Return in about 4 months (around 10/15/2021) for Give appt with LuLurena Joinern 1 mth for MeJ. C. Penney DeKarle PlumberMD, FACP

## 2021-06-18 LAB — COMPREHENSIVE METABOLIC PANEL
ALT: 15 IU/L (ref 0–44)
AST: 23 IU/L (ref 0–40)
Albumin/Globulin Ratio: 1.6 (ref 1.2–2.2)
Albumin: 4.1 g/dL (ref 3.8–4.8)
Alkaline Phosphatase: 86 IU/L (ref 44–121)
BUN/Creatinine Ratio: 13 (ref 10–24)
BUN: 24 mg/dL (ref 8–27)
Bilirubin Total: 0.4 mg/dL (ref 0.0–1.2)
CO2: 27 mmol/L (ref 20–29)
Calcium: 9.1 mg/dL (ref 8.6–10.2)
Chloride: 104 mmol/L (ref 96–106)
Creatinine, Ser: 1.91 mg/dL — ABNORMAL HIGH (ref 0.76–1.27)
Globulin, Total: 2.6 g/dL (ref 1.5–4.5)
Glucose: 143 mg/dL — ABNORMAL HIGH (ref 70–99)
Potassium: 4.3 mmol/L (ref 3.5–5.2)
Sodium: 146 mmol/L — ABNORMAL HIGH (ref 134–144)
Total Protein: 6.7 g/dL (ref 6.0–8.5)
eGFR: 39 mL/min/{1.73_m2} — ABNORMAL LOW (ref >59)

## 2021-06-18 LAB — LIPID PANEL
Chol/HDL Ratio: 4.3 ratio (ref 0.0–5.0)
Cholesterol, Total: 150 mg/dL (ref 100–199)
HDL: 35 mg/dL — ABNORMAL LOW (ref >39)
LDL Chol Calc (NIH): 87 mg/dL (ref 0–99)
Triglycerides: 162 mg/dL — ABNORMAL HIGH (ref 0–149)
VLDL Cholesterol Cal: 28 mg/dL (ref 5–40)

## 2021-06-18 LAB — CBC
Hematocrit: 34.2 % — ABNORMAL LOW (ref 37.5–51.0)
Hemoglobin: 11.1 g/dL — ABNORMAL LOW (ref 13.0–17.7)
MCH: 27.9 pg (ref 26.6–33.0)
MCHC: 32.5 g/dL (ref 31.5–35.7)
MCV: 86 fL (ref 79–97)
Platelets: 199 10*3/uL (ref 150–450)
RBC: 3.98 x10E6/uL — ABNORMAL LOW (ref 4.14–5.80)
RDW: 14.3 % (ref 11.6–15.4)
WBC: 9.1 10*3/uL (ref 3.4–10.8)

## 2021-06-18 LAB — MICROALBUMIN / CREATININE URINE RATIO
Creatinine, Urine: 21.7 mg/dL
Microalb/Creat Ratio: 3303 mg/g creat — ABNORMAL HIGH (ref 0–29)
Microalbumin, Urine: 716.7 ug/mL

## 2021-06-19 ENCOUNTER — Telehealth: Payer: Self-pay | Admitting: Internal Medicine

## 2021-06-19 DIAGNOSIS — E1121 Type 2 diabetes mellitus with diabetic nephropathy: Secondary | ICD-10-CM

## 2021-06-19 NOTE — Telephone Encounter (Signed)
Phone call placed to patient this morning.  Patient informed that his kidney function remains poor and is a little worse compared to when it was last checked in May of this year.  His GFR has ranged from 38-47 over the past 1 year.  Most recent GFR was 39.  Patient has 3 g of protein in the urine which is increased from 2 years ago.  He is on hydralazine.  Not on ACE inhibitor due to acute kidney injury last year.  I will try to get him to a kidney specialist as soon as possible to help with management. Patient informed that his anemia has improved. Cholesterol levels okay. Liver function test normal. All questions were answered.

## 2021-06-30 ENCOUNTER — Other Ambulatory Visit: Payer: Self-pay | Admitting: Internal Medicine

## 2021-06-30 DIAGNOSIS — I10 Essential (primary) hypertension: Secondary | ICD-10-CM

## 2021-07-01 NOTE — Telephone Encounter (Signed)
Call to pharmacy- this is not current dosing- patient has plenty RF on current dosing.  Requested Prescriptions  Pending Prescriptions Disp Refills  . hydrALAZINE (APRESOLINE) 50 MG tablet [Pharmacy Med Name: HYDRALAZINE 50 MG TABLET] 90 tablet     Sig: TAKE 1 TABLET BY MOUTH THREE TIMES A DAY (STOP THE CARVEDILOL)     Cardiovascular:  Vasodilators Failed - 06/30/2021  2:33 PM      Failed - HCT in normal range and within 360 days    Hematocrit  Date Value Ref Range Status  06/17/2021 34.2 (L) 37.5 - 51.0 % Final         Failed - HGB in normal range and within 360 days    Hemoglobin  Date Value Ref Range Status  06/17/2021 11.1 (L) 13.0 - 17.7 g/dL Final         Failed - RBC in normal range and within 360 days    RBC  Date Value Ref Range Status  06/17/2021 3.98 (L) 4.14 - 5.80 x10E6/uL Final  12/29/2020 3.22 (L) 4.22 - 5.81 MIL/uL Final         Failed - Last BP in normal range    BP Readings from Last 1 Encounters:  06/17/21 (!) 153/75         Passed - WBC in normal range and within 360 days    WBC  Date Value Ref Range Status  06/17/2021 9.1 3.4 - 10.8 x10E3/uL Final  12/29/2020 7.4 4.0 - 10.5 K/uL Final         Passed - PLT in normal range and within 360 days    Platelets  Date Value Ref Range Status  06/17/2021 199 150 - 450 x10E3/uL Final         Passed - Valid encounter within last 12 months    Recent Outpatient Visits          2 weeks ago Type 2 diabetes mellitus with diabetic polyneuropathy, with long-term current use of insulin ([place])   [place], Neoma Laming B, MD   7 months ago Type 2 diabetes mellitus with diabetic polyneuropathy, with long-term current use of insulin ([place])   [place], MD   11 months ago Hospital discharge follow-up   [place], Deborah B, MD   1 year ago Type 2 diabetes mellitus with diabetic polyneuropathy,  with long-term current use of insulin [facility])   [place], Deborah B, MD   1 year ago DM type 2 with diabetic peripheral neuropathy [facility])   [place], Dionne Bucy, [place]      Future Appointments            In 3 months Wynetta Emery, Dalbert Batman, MD [place]

## 2021-07-03 ENCOUNTER — Ambulatory Visit (INDEPENDENT_AMBULATORY_CARE_PROVIDER_SITE_OTHER): Payer: Self-pay | Admitting: Podiatry

## 2021-07-03 DIAGNOSIS — Z91199 Patient's noncompliance with other medical treatment and regimen due to unspecified reason: Secondary | ICD-10-CM

## 2021-07-07 NOTE — Progress Notes (Signed)
   Complete physical exam  Patient: Derek Lowery   DOB: 06/07/1997   65 y.o. Male  MRN: [id number]  Subjective:    No chief complaint on file.   Derek Lowery is a 65 y.o. male who presents today for a complete physical exam. She reports consuming a {diet types:17450} diet. {types:19826} She generally feels {DESC; WELL/FAIRLY WELL/POORLY:18703}. She reports sleeping {DESC; WELL/FAIRLY WELL/POORLY:18703}. She {does/does not:200015} have additional problems to discuss today.    Most recent fall risk assessment:    02/12/2022   10:42 AM  Fall Risk   Falls in the past year? 0  Number falls in past yr: 0  Injury with Fall? 0  Risk for fall due to : No Fall Risks  Follow up Falls evaluation completed     Most recent depression screenings:    02/12/2022   10:42 AM 01/03/2021   10:46 AM  PHQ 2/9 Scores  PHQ - 2 Score 0 0  PHQ- 9 Score 5     {VISON DENTAL STD PSA (Optional):27386}  {History (Optional):23778}  Patient Care Team: Jessup, Joy, NP as PCP - General (Nurse Practitioner)   Outpatient Medications Prior to Visit  Medication Sig   fluticasone (FLONASE) 50 MCG/ACT nasal spray Place 2 sprays into both nostrils in the morning and at bedtime. After 7 days, reduce to once daily.   norgestimate-ethinyl estradiol (SPRINTEC 28) 0.25-35 MG-MCG tablet Take 1 tablet by mouth daily.   Nystatin POWD Apply liberally to affected area 2 times per day   spironolactone (ALDACTONE) 100 MG tablet Take 1 tablet (100 mg total) by mouth daily.   No facility-administered medications prior to visit.    ROS        Objective:     There were no vitals taken for this visit. {Vitals History (Optional):23777}  Physical Exam   No results found for any visits on 03/20/22. {Show previous labs (optional):23779}    Assessment & Plan:    Routine Health Maintenance and Physical Exam  Immunization History  Administered Date(s) Administered   DTaP 08/21/1999, 10/17/1999,  12/26/1999, 09/10/2000, 03/26/2004   Hepatitis A 01/21/2008, 01/26/2009   Hepatitis B 06/08/1999, 07/16/1999, 12/26/1999   HiB (PRP-OMP) 08/21/1999, 10/17/1999, 12/26/1999, 09/10/2000   IPV 08/21/1999, 10/17/1999, 06/15/2000, 03/26/2004   Influenza,inj,Quad PF,6+ Mos 04/28/2014   Influenza-Unspecified 07/28/2012   MMR 06/15/2001, 03/26/2004   Meningococcal Polysaccharide 01/26/2012   Pneumococcal Conjugate-13 09/10/2000   Pneumococcal-Unspecified 12/26/1999, 03/10/2000   Tdap 01/26/2012   Varicella 06/15/2000, 01/21/2008    Health Maintenance  Topic Date Due   HIV Screening  Never done   Hepatitis C Screening  Never done   INFLUENZA VACCINE  03/18/2022   PAP-Cervical Cytology Screening  03/20/2022 (Originally 06/06/2020)   PAP SMEAR-Modifier  03/20/2022 (Originally 06/06/2020)   TETANUS/TDAP  03/20/2022 (Originally 01/25/2022)   HPV VACCINES  Discontinued   COVID-19 Vaccine  Discontinued    Discussed health benefits of physical activity, and encouraged her to engage in regular exercise appropriate for her age and condition.  Problem List Items Addressed This Visit   None Visit Diagnoses     Annual physical exam    -  Primary   Cervical cancer screening       Need for Tdap vaccination          No follow-ups on file.     Joy Jessup, NP   

## 2021-07-18 ENCOUNTER — Encounter (INDEPENDENT_AMBULATORY_CARE_PROVIDER_SITE_OTHER): Payer: Medicare Other | Admitting: Ophthalmology

## 2021-07-23 ENCOUNTER — Ambulatory Visit (INDEPENDENT_AMBULATORY_CARE_PROVIDER_SITE_OTHER): Payer: Medicare Other | Admitting: Ophthalmology

## 2021-07-23 ENCOUNTER — Other Ambulatory Visit: Payer: Self-pay

## 2021-07-23 ENCOUNTER — Encounter (INDEPENDENT_AMBULATORY_CARE_PROVIDER_SITE_OTHER): Payer: Self-pay | Admitting: Ophthalmology

## 2021-07-23 DIAGNOSIS — H4051X3 Glaucoma secondary to other eye disorders, right eye, severe stage: Secondary | ICD-10-CM

## 2021-07-23 DIAGNOSIS — E113511 Type 2 diabetes mellitus with proliferative diabetic retinopathy with macular edema, right eye: Secondary | ICD-10-CM | POA: Diagnosis not present

## 2021-07-23 DIAGNOSIS — H211X1 Other vascular disorders of iris and ciliary body, right eye: Secondary | ICD-10-CM

## 2021-07-23 MED ORDER — BEVACIZUMAB 2.5 MG/0.1ML IZ SOSY
2.5000 mg | PREFILLED_SYRINGE | INTRAVITREAL | Status: AC | PRN
Start: 2021-07-23 — End: 2021-07-23
  Administered 2021-07-23: 2.5 mg via INTRAVITREAL

## 2021-07-23 NOTE — Progress Notes (Signed)
07/23/2021     CHIEF COMPLAINT Patient presents for  Chief Complaint  Patient presents with   Retina Follow Up      HISTORY OF PRESENT ILLNESS: Derek Lowery is a 65 y.o. male who presents to the clinic today for:   HPI     Retina Follow Up   Patient presents with  Diabetic Retinopathy.  In right eye.  This started 5 weeks ago.  Severity is mild.  Duration of 5 weeks.  Since onset it is stable.        Comments   5 weeks fu OD oct avastin OD.  History of neovascular glaucoma with iris rubeosis recurrent when tapering her off of antivegF medications intravitreally  Also recurrence of CSME mild in the past off of antivegF.  Monocular patient.  Follow-up today at 6-week interval Pt states "vision seems like it is more blurry today, just this morning." Pt denies any changes since the last visit, denies new FOL or floaters. Pt states he uses timolol BID OD, latanoprost QHS OD, dorzolamide OD BID, brimonidine OD BID, and Rhopressa QHS OD.      Last edited by Hurman Horn, MD on 07/23/2021 10:17 AM.      Referring physician: Ladell Pier, MD [place],  [place] [address]  HISTORICAL INFORMATION:   Selected notes from the MEDICAL RECORD NUMBER    Lab Results  Component Value Date   HGBA1C 7.1 (A) 06/17/2021     CURRENT MEDICATIONS: Current Outpatient Medications (Ophthalmic Drugs)  Medication Sig   brimonidine (ALPHAGAN) 0.2 % ophthalmic solution Place 1 drop into the right eye 2 (two) times daily.    dorzolamide (TRUSOPT) 2 % ophthalmic solution Place 1 drop into the right eye 2 (two) times daily.   latanoprost (XALATAN) 0.005 % ophthalmic solution Place 1 drop into the right eye at bedtime.   RHOPRESSA 0.02 % SOLN Place 1 drop into the right eye at bedtime.   timolol (BETIMOL) 0.5 % ophthalmic solution Place 1 drop into the right eye 2 (two) times daily.   No current facility-administered medications for this visit. (Ophthalmic Drugs)    Current Outpatient Medications (Other)  Medication Sig   amLODipine (NORVASC) 10 MG tablet Take 1 tablet (10 mg total) by mouth daily.   ammonium lactate (AMLACTIN) 12 % lotion Apply to both feet twice daily for dry skin.   aspirin EC 81 MG tablet Take 1 tablet (81 mg total) by mouth daily.   atorvastatin (LIPITOR) 40 MG tablet TAKE 1 TABLET BY MOUTH EVERY DAY   BD PEN NEEDLE NANO 2ND GEN 32G X 4 MM MISC USE AS DIRECTED 3 TIMES A DAY. PLEASE SCHEDULE AN APPOINTMENT FOR ADDITIONAL REFILLS.   blood glucose meter kit and supplies KIT Dispense based on patient and insurance preference. Use up to four times daily as directed. One Touch Verio   Continuous Blood Gluc Receiver (DEXCOM G6 RECEIVER) DEVI 1 Device by Does not apply route daily.   Continuous Blood Gluc Receiver (FREESTYLE LIBRE 14 DAY READER) DEVI USE AS DIRECTED   Continuous Blood Gluc Sensor (DEXCOM G6 SENSOR) MISC 1 packet by Does not apply route daily.   Continuous Blood Gluc Sensor (FREESTYLE LIBRE SENSOR SYSTEM) MISC Change sensor Q 2 wks   glucose blood (ACCU-CHEK AVIVA PLUS) test strip Use as instructed for 3 times daily testing of blood sugar. E11.9   glucose monitoring kit (FREESTYLE) monitoring kit 1 each by Does not apply route as  needed for other.   hydrALAZINE (APRESOLINE) 50 MG tablet Take 1.5 tablets (75 mg total) by mouth 3 (three) times daily.   hydrocortisone (ANUSOL-HC) 2.5 % rectal cream Place 1 application rectally 2 (two) times daily as needed for hemorrhoids or anal itching (for flare of hemorrhoids).   insulin glargine (LANTUS SOLOSTAR) 100 UNIT/ML Solostar Pen Inject 38 Units into the skin at bedtime.   insulin lispro (HUMALOG KWIKPEN) 100 UNIT/ML KwikPen Inject 10 Units into the skin 3 (three) times daily with meals.   Lancets MISC Use as directed.  Accu chek 2   Multiple Vitamins-Minerals (MULTIVITAMIN WITH MINERALS) tablet Take 1 tablet by mouth daily.   torsemide (DEMADEX) 20 MG tablet TAKE 2 TABLETS (40 MG  TOTAL) BY MOUTH EVERY DAY   No current facility-administered medications for this visit. (Other)      REVIEW OF SYSTEMS:    ALLERGIES Allergies  Allergen Reactions   Coreg [Carvedilol] Other (See Comments)    Redness and swelling of calf    PAST MEDICAL HISTORY Past Medical History:  Diagnosis Date   Arthritis    CAD (coronary artery disease)    NSTEMI 06/2011:  LHC 07/18/11: Proximal diagonal 60%, distal LAD with a diabetic appearance and 60% stenosis, OM2 with an occluded superior branch and an inferior branch with 90%, EF 55% with inferior hypokinesis.  PCI: Promus DES to the OM2 inferior branch.  This vessel provides collaterals to the superior branch which remained occluded.  Echocardiogram 07/18/11: EF 60%, normal wall motion.   Cataract    Essential hypertension, benign    Glaucoma    Hypercholesteremia    Internal hemorrhoids    Noncompliance    Type 2 diabetes mellitus ([place]) 1990   Past Surgical History:  Procedure Laterality Date   CATARACT EXTRACTION Right 2019   Dr. Katy Fitch   CHOLECYSTECTOMY N/A 09/21/2017   Procedure: LAPAROSCOPIC CHOLECYSTECTOMY WITH INTRAOPERATIVE CHOLANGIOGRAM;  Surgeon: Michael Boston, MD;  Location: [facility];  Service: General;  Laterality: N/A;   COLONOSCOPY  2000   Dr. Collene Mares   EYE SURGERY     IRRIGATION AND DEBRIDEMENT ABSCESS Left 05/05/2014   Procedure: IRRIGATION AND DEBRIDEMENT ABSCESS left buttock;  Surgeon: Michael Boston, MD;  Location: [facility];  Service: General;  Laterality: Left;   LEFT HEART CATHETERIZATION WITH CORONARY ANGIOGRAM N/A 07/18/2011   Procedure: LEFT HEART CATHETERIZATION WITH CORONARY ANGIOGRAM;  Surgeon: Hillary Bow, MD;  Location[facility] C[facility] LAB;  Service: Cardiovascular;  Laterality: N/A;   PERCUTANEOUS CORONARY STENT INTERVENTION (PCI-S)  07/18/2011   Procedure: PERCUTANEOUS CORONARY STENT INTERVENTION (PCI-S);  Surgeon: Hillary Bow, MD;  Location: [facility] CATH LAB;  Service: Cardiovascular;;    FAMILY  HISTORY Family History  Problem Relation Age of Onset   Coronary artery disease Father        Developed in his 57s   Kidney disease Father    Diabetes Father    Melanoma Father    Rectal cancer Father    Heart failure Mother    Hypertension Mother    Diabetes Sister    Diabetes Brother    Colon cancer Neg Hx    Esophageal cancer Neg Hx    Stomach cancer Neg Hx     SOCIAL HISTORY Social History   Tobacco Use   Smoking status: Never   Smokeless tobacco: Never  Vaping Use   Vaping Use: Never used  Substance Use Topics   Alcohol use: No   Drug use: No  OPHTHALMIC EXAM:  Base Eye Exam     Visual Acuity (ETDRS)       Right Left   Dist [place] 20/30 NLP         Tonometry (Tonopen, 9:33 AM)       Right Left   Pressure 14 24         Pupils       Pupils Dark Shape APD   Right PERRL  Irregular None   Left PERRL 3 Round +1         Extraocular Movement       Right Left    Full Full         Neuro/Psych     Oriented x3: Yes   Mood/Affect: Normal         Dilation     Right eye: 1.0% Mydriacyl, 2.5% Phenylephrine            Slit Lamp and Fundus Exam     External Exam       Right Left   External Normal Normal         Slit Lamp Exam       Right Left   Lids/Lashes Normal Normal   Conjunctiva/Sclera White and quiet, no exposure of tube White and quiet   Cornea Clear Clear   Anterior Chamber ,, Tube 12 meridian Deep   Iris Old NVI large trunks, no signs of new  NVI, updrawn pupil, 3.5 mm Old neovascularization of the iris 360 degrees, stable nonprogressive   Lens Posterior chamber intraocular lens 3.5+ Nuclear sclerosis   Anterior Vitreous Normal          Fundus Exam       Right Left   Posterior Vitreous Posterior vitreous detachment    Disc Normal    C/D Ratio 0.7    Macula Microaneurysms,, Retinal pigment epithelial atrophy, Macular thickening, no clinically significant macular edema    Vessels Proliferative diabetic  retinopathy, quiet    Periphery  good panretinal photocoagulation, post recent PRP             IMAGING AND PROCEDURES  Imaging and Procedures for 07/23/21  OCT, Retina - OU - Both Eyes       Right Eye Quality was good. Scan locations included subfoveal. Central Foveal Thickness: 291. Progression has been stable. Findings include cystoid macular edema.   Notes Chronic CSME, stable overall with no increase in CSME, center involved in the past, will repeat injection Avastin today at nearly 6-week interval to maintain     Intravitreal Injection, Pharmacologic Agent - OD - Right Eye       Time Out 07/23/2021. 10:12 AM. Confirmed correct patient, procedure, site, and patient consented.   Anesthesia Topical anesthesia was used. Anesthetic medications included Lidocaine 4%.   Procedure Preparation included Tobramycin 0.3%, 10% betadine to eyelids, 5% betadine to ocular surface. A 30 gauge needle was used.   Injection: 2.5 mg bevacizumab 2.5 MG/0.1ML   Route: Intravitreal, Site: Right Eye   NDC: [id number], Lot: [id number]   Post-op Post injection exam found visual acuity of at least counting fingers. The patient tolerated the procedure well. There were no complications. The patient received written and verbal post procedure care education. Post injection medications included ocuflox.              ASSESSMENT/PLAN:  Proliferative diabetic retinopathy of right eye with macular edema associated with type 2 diabetes mellitus (HCC) Chronic active disease concomitant with anterior segment neovascularization which is only  controlled in the past with antivegF medication long-term.  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 Proliferative diabetic retinopathy of right eye with macular edema associated with type 2 diabetes mellitus (HCC)  E11.3511 OCT, Retina - OU - Both Eyes    Intravitreal Injection, Pharmacologic Agent - OD - Right Eye    bevacizumab (AVASTIN) SOSY 2.5 mg    2. Rubeosis iridis of right eye  H21.1X1     3. Neovascular glaucoma, right eye, severe stage  H40.51X3       1.  OD, vastly improved and stabilized on intravitreal Avastin.  Less active CNVM.  Most importantly less active NVA and NVI with stabilized neovascular glaucoma on antivegF chronically.  Repeat intravitreal Avastin OD today monocular patient  2.  Continue on topical medical therapy to control IOP OD  3.  Ophthalmic Meds Ordered this visit:  Meds ordered this encounter  Medications   bevacizumab (AVASTIN) SOSY 2.5 mg       Return for RV 5 to 6 weeks, dilate, OD, AVASTIN OCT.  There are no Patient Instructions on file for this visit.   Explained the diagnoses, plan, and follow up with the patient and they expressed understanding.  Patient expressed understanding of the importance of proper follow up care.   Clent Demark Amorie Rentz M.D. Diseases & Surgery of the Retina and Vitreous Retina & Diabetic [place] 07/23/21     Abbreviations: M myopia (nearsighted); A astigmatism; H hyperopia (farsighted); P presbyopia; Mrx spectacle prescription;  CTL contact lenses; OD right eye; OS left eye; OU both eyes  XT exotropia; ET esotropia; PEK punctate epithelial keratitis; PEE punctate epithelial erosions; DES dry eye syndrome; MGD meibomian gland dysfunction; ATs artificial tears; PFAT's preservative free artificial tears; [place] nuclear sclerotic cataract; PSC posterior subcapsular cataract; ERM epi-retinal membrane; PVD posterior vitreous detachment; RD retinal detachment; DM diabetes mellitus; DR diabetic  retinopathy; NPDR non-proliferative diabetic retinopathy; PDR proliferative diabetic retinopathy; CSME clinically significant macular edema; DME diabetic macular edema; dbh dot blot hemorrhages; CWS cotton wool spot; POAG primary open angle glaucoma; C/D cup-to-disc ratio; HVF humphrey visual field; GVF goldmann visual field; OCT optical coherence tomography; IOP intraocular pressure; BRVO Branch retinal vein occlusion; CRVO central retinal vein occlusion; CRAO central retinal artery occlusion; BRAO branch retinal artery occlusion; RT retinal tear; SB scleral buckle; PPV pars plana vitrectomy; VH Vitreous hemorrhage; PRP panretinal laser photocoagulation; IVK intravitreal kenalog; VMT vitreomacular traction; MH Macular hole;  NVD neovascularization of the disc; NVE neovascularization elsewhere; AREDS age related eye disease study; ARMD age related macular degeneration; POAG primary open angle glaucoma; EBMD epithelial/anterior basement membrane dystrophy; ACIOL anterior chamber intraocular lens; IOL intraocular lens; PCIOL posterior chamber intraocular lens; Phaco/IOL phacoemulsification with intraocular lens placement; [place] photorefractive keratectomy; LASIK laser assisted in situ keratomileusis; HTN hypertension; DM diabetes mellitus; COPD chronic obstructive pulmonary disease

## 2021-07-23 NOTE — Assessment & Plan Note (Addendum)
Residual in the past and stabilized now yet controlled N/V a and NVI with recurrent antivegF medication  Continue topical medication to control as scheduled

## 2021-07-23 NOTE — Assessment & Plan Note (Signed)
Chronic active and increases in activity with longer duration between examination and antivegF injection

## 2021-07-23 NOTE — Assessment & Plan Note (Signed)
Continue on current medications right eye

## 2021-07-23 NOTE — Assessment & Plan Note (Signed)
Chronic active disease concomitant with anterior segment neovascularization which is only controlled in the past with antivegF medication long-term.

## 2021-07-31 ENCOUNTER — Other Ambulatory Visit: Payer: Self-pay | Admitting: Internal Medicine

## 2021-07-31 DIAGNOSIS — E1142 Type 2 diabetes mellitus with diabetic polyneuropathy: Secondary | ICD-10-CM

## 2021-07-31 NOTE — Telephone Encounter (Signed)
Requested Prescriptions  Pending Prescriptions Disp Refills   HUMALOG KWIKPEN 100 UNIT/ML KwikPen [Pharmacy Med Name: HUMALOG 100 UNIT/ML KWIKPEN] 15 mL 3    Sig: INJECT 10 UNITS INTO THE SKIN 3 (THREE) TIMES DAILY WITH MEALS.     Endocrinology:  Diabetes - Insulins Passed - 07/31/2021  1:40 AM      Passed - HBA1C is between 0 and 7.9 and within 180 days    HbA1c, POC (controlled diabetic range)  Date Value Ref Range Status  06/17/2021 7.1 (A) 0.0 - 7.0 % Final         Passed - Valid encounter within last 6 months    Recent Outpatient Visits          1 month ago Type 2 diabetes mellitus with diabetic polyneuropathy, with long-term current use of insulin ([place])   [place], Neoma Laming B, MD   8 months ago Type 2 diabetes mellitus with diabetic polyneuropathy, with long-term current use of insulin ([place])   [place] Ladell Pier, MD   1 year ago Hospital discharge follow-up   [place], Deborah B, MD   1 year ago Type 2 diabetes mellitus with diabetic polyneuropathy, with long-term current use of insulin [facility])   [place], [place]   2 years ago DM type 2 with diabetic peripheral neuropathy [facility])   [place], Dionne Bucy, [place]      Future Appointments            In 2 months Wynetta Emery, Dalbert Batman, MD [place]

## 2021-08-22 ENCOUNTER — Other Ambulatory Visit: Payer: Self-pay | Admitting: Internal Medicine

## 2021-08-22 NOTE — Telephone Encounter (Signed)
Requested medication (s) are due for refill today Yes  Requested medication (s) are on the active medication list Yes  Future visit scheduled Yes for 10/18/21.   Note to clinic-last ordered by historical provider. Has retina specialist appointment 08/27/21. Routing to pcp for review.    Requested Prescriptions  Pending Prescriptions Disp Refills   dorzolamide (TRUSOPT) 2 % ophthalmic solution [Pharmacy Med Name: DORZOLAMIDE HCL 2% EYE DROPS] 30 mL 11    Sig: INSTILL 1 DROP INTO RIGHT EYE TWICE A DAY     Ophthalmology:  Glaucoma Passed - 08/22/2021  1:38 AM      Passed - Valid encounter within last 12 months    Recent Outpatient Visits           2 months ago Type 2 diabetes mellitus with diabetic polyneuropathy, with long-term current use of insulin ([place])   [place], Neoma Laming B, MD   9 months ago Type 2 diabetes mellitus with diabetic polyneuropathy, with long-term current use of insulin ([place])   [place], Deborah B, MD   1 year ago Hospital discharge follow-up   [place], Deborah B, MD   1 year ago Type 2 diabetes mellitus with diabetic polyneuropathy, with long-term current use of insulin [facility])   [place], MD   2 years ago DM type 2 with diabetic peripheral neuropathy [facility])   [place], Dionne Bucy, [place]       Future Appointments             In 1 month Wynetta Emery, Dalbert Batman, MD [place]

## 2021-08-27 ENCOUNTER — Other Ambulatory Visit: Payer: Self-pay

## 2021-08-27 ENCOUNTER — Encounter (INDEPENDENT_AMBULATORY_CARE_PROVIDER_SITE_OTHER): Payer: Self-pay | Admitting: Ophthalmology

## 2021-08-27 ENCOUNTER — Ambulatory Visit (INDEPENDENT_AMBULATORY_CARE_PROVIDER_SITE_OTHER): Payer: 59 | Admitting: Ophthalmology

## 2021-08-27 DIAGNOSIS — E113511 Type 2 diabetes mellitus with proliferative diabetic retinopathy with macular edema, right eye: Secondary | ICD-10-CM

## 2021-08-27 DIAGNOSIS — H2140 Pupillary membranes, unspecified eye: Secondary | ICD-10-CM | POA: Diagnosis not present

## 2021-08-27 DIAGNOSIS — H4051X3 Glaucoma secondary to other eye disorders, right eye, severe stage: Secondary | ICD-10-CM | POA: Diagnosis not present

## 2021-08-27 DIAGNOSIS — H4050X3 Glaucoma secondary to other eye disorders, unspecified eye, severe stage: Secondary | ICD-10-CM

## 2021-08-27 MED ORDER — BEVACIZUMAB 2.5 MG/0.1ML IZ SOSY
2.5000 mg | PREFILLED_SYRINGE | INTRAVITREAL | Status: AC | PRN
  Administered 2021-08-27: 2.5 mg via INTRAVITREAL

## 2021-08-27 NOTE — Assessment & Plan Note (Signed)
OD looks great at 5 weeks with minor perifoveal CSME repeat injection today Avastin to control CSME  No anterior segment neovascularization

## 2021-08-27 NOTE — Assessment & Plan Note (Signed)
IOP well controlled, no active NVI

## 2021-08-27 NOTE — Assessment & Plan Note (Signed)
Stable condition OD

## 2021-08-27 NOTE — Progress Notes (Signed)
08/27/2021     CHIEF COMPLAINT Patient presents for  Chief Complaint  Patient presents with   Retina Follow Up      HISTORY OF PRESENT ILLNESS: Derek Lowery is a 66 y.o. male who presents to the clinic today for:   HPI     Retina Follow Up           Diagnosis: Diabetic Retinopathy   Laterality: right eye   Onset: 5 weeks ago   Severity: mild   Duration: 5 weeks   Course: stable         Comments   5 weeks dilate OD, Avastin OCT. Patient states vision is stable and unchanged since last visit. Denies any new floaters or FOL.  Pt states he uses timolol BID OD, latanoprost QHS OD, dorzolamide OD BID, brimonidine OD BID, and Rhopressa QHS OD      Last edited by Laurin Coder on 08/27/2021  9:51 AM.      Referring physician: Ladell Pier, MD [place],  [place] [address]  HISTORICAL INFORMATION:   Selected notes from the MEDICAL RECORD NUMBER    Lab Results  Component Value Date   HGBA1C 7.1 (A) 06/17/2021     CURRENT MEDICATIONS: Current Outpatient Medications (Ophthalmic Drugs)  Medication Sig   brimonidine (ALPHAGAN) 0.2 % ophthalmic solution Place 1 drop into the right eye 2 (two) times daily.    dorzolamide (TRUSOPT) 2 % ophthalmic solution Place 1 drop into the right eye 2 (two) times daily.   latanoprost (XALATAN) 0.005 % ophthalmic solution Place 1 drop into the right eye at bedtime.   RHOPRESSA 0.02 % SOLN Place 1 drop into the right eye at bedtime.   timolol (BETIMOL) 0.5 % ophthalmic solution Place 1 drop into the right eye 2 (two) times daily.   No current facility-administered medications for this visit. (Ophthalmic Drugs)   Current Outpatient Medications (Other)  Medication Sig   amLODipine (NORVASC) 10 MG tablet Take 1 tablet (10 mg total) by mouth daily.   ammonium lactate (AMLACTIN) 12 % lotion Apply to both feet twice daily for dry skin.   aspirin EC 81 MG tablet Take 1 tablet (81 mg total) by mouth daily.    atorvastatin (LIPITOR) 40 MG tablet TAKE 1 TABLET BY MOUTH EVERY DAY   BD PEN NEEDLE NANO 2ND GEN 32G X 4 MM MISC USE AS DIRECTED 3 TIMES A DAY. PLEASE SCHEDULE AN APPOINTMENT FOR ADDITIONAL REFILLS.   blood glucose meter kit and supplies KIT Dispense based on patient and insurance preference. Use up to four times daily as directed. One Touch Verio   Continuous Blood Gluc Receiver (DEXCOM G6 RECEIVER) DEVI 1 Device by Does not apply route daily.   Continuous Blood Gluc Receiver (FREESTYLE LIBRE 14 DAY READER) DEVI USE AS DIRECTED   Continuous Blood Gluc Sensor (DEXCOM G6 SENSOR) MISC 1 packet by Does not apply route daily.   Continuous Blood Gluc Sensor (FREESTYLE LIBRE SENSOR SYSTEM) MISC Change sensor Q 2 wks   glucose blood (ACCU-CHEK AVIVA PLUS) test strip Use as instructed for 3 times daily testing of blood sugar. E11.9   glucose monitoring kit (FREESTYLE) monitoring kit 1 each by Does not apply route as needed for other.   HUMALOG KWIKPEN 100 UNIT/ML KwikPen INJECT 10 UNITS INTO THE SKIN 3 (THREE) TIMES DAILY WITH MEALS.   hydrALAZINE (APRESOLINE) 50 MG tablet Take 1.5 tablets (75 mg total) by mouth 3 (three) times daily.  hydrocortisone (ANUSOL-HC) 2.5 % rectal cream Place 1 application rectally 2 (two) times daily as needed for hemorrhoids or anal itching (for flare of hemorrhoids).   insulin glargine (LANTUS SOLOSTAR) 100 UNIT/ML Solostar Pen Inject 38 Units into the skin at bedtime.   Lancets MISC Use as directed.  Accu chek 2   Multiple Vitamins-Minerals (MULTIVITAMIN WITH MINERALS) tablet Take 1 tablet by mouth daily.   torsemide (DEMADEX) 20 MG tablet TAKE 2 TABLETS (40 MG TOTAL) BY MOUTH EVERY DAY   No current facility-administered medications for this visit. (Other)      REVIEW OF SYSTEMS:    ALLERGIES Allergies  Allergen Reactions   Coreg [Carvedilol] Other (See Comments)    Redness and swelling of calf    PAST MEDICAL HISTORY Past Medical History:  Diagnosis Date    Arthritis    CAD (coronary artery disease)    NSTEMI 06/2011:  LHC 07/18/11: Proximal diagonal 60%, distal LAD with a diabetic appearance and 60% stenosis, OM2 with an occluded superior branch and an inferior branch with 90%, EF 55% with inferior hypokinesis.  PCI: Promus DES to the OM2 inferior branch.  This vessel provides collaterals to the superior branch which remained occluded.  Echocardiogram 07/18/11: EF 60%, normal wall motion.   Cataract    Essential hypertension, benign    Glaucoma    Hypercholesteremia    Internal hemorrhoids    Noncompliance    Type 2 diabetes mellitus ([place]) 1990   Past Surgical History:  Procedure Laterality Date   CATARACT EXTRACTION Right 2019   Dr. Katy Fitch   CHOLECYSTECTOMY N/A 09/21/2017   Procedure: LAPAROSCOPIC CHOLECYSTECTOMY WITH INTRAOPERATIVE CHOLANGIOGRAM;  Surgeon: Michael Boston, MD;  Location: [facility];  Service: General;  Laterality: N/A;   COLONOSCOPY  2000   Dr. Collene Mares   EYE SURGERY     IRRIGATION AND DEBRIDEMENT ABSCESS Left 05/05/2014   Procedure: IRRIGATION AND DEBRIDEMENT ABSCESS left buttock;  Surgeon: Michael Boston, MD;  Location: [facility];  Service: General;  Laterality: Left;   LEFT HEART CATHETERIZATION WITH CORONARY ANGIOGRAM N/A 07/18/2011   Procedure: LEFT HEART CATHETERIZATION WITH CORONARY ANGIOGRAM;  Surgeon: Hillary Bow, MD;  Location: [facility] CATH LAB;  Service: Cardiovascular;  Laterality: N/A;   PERCUTANEOUS CORONARY STENT INTERVENTION (PCI-S)  07/18/2011   Procedure: PERCUTANEOUS CORONARY STENT INTERVENTION (PCI-S);  Surgeon: Hillary Bow, MD;  Location: [facility] CATH LAB;  Service: Cardiovascular;;    FAMILY HISTORY Family History  Problem Relation Age of Onset   Coronary artery disease Father        Developed in his 54s   Kidney disease Father    Diabetes Father    Melanoma Father    Rectal cancer Father    Heart failure Mother    Hypertension Mother    Diabetes Sister    Diabetes Brother    Colon cancer Neg Hx     Esophageal cancer Neg Hx    Stomach cancer Neg Hx     SOCIAL HISTORY Social History   Tobacco Use   Smoking status: Never   Smokeless tobacco: Never  Vaping Use   Vaping Use: Never used  Substance Use Topics   Alcohol use: No   Drug use: No         OPHTHALMIC EXAM:  Base Eye Exam     Visual Acuity (ETDRS)       Right Left   Dist [place] 20/25 -2 NLP         Tonometry (Tonopen, 9:49 AM)  Right Left   Pressure 14 36         Pupils       Dark Light Shape APD   Right   Irregular None   Left 3 3 Round +1         Extraocular Movement       Right Left    Full     -- -- --  --  --  -- -- --   -- -- --  --  --  -- -- --           Neuro/Psych     Oriented x3: Yes   Mood/Affect: Normal         Dilation     Right eye: 1.0% Mydriacyl, 2.5% Phenylephrine @ 9:49 AM           Slit Lamp and Fundus Exam     External Exam       Right Left   External Normal Normal         Slit Lamp Exam       Right Left   Lids/Lashes Normal Normal   Conjunctiva/Sclera White and quiet, no exposure of tube White and quiet   Cornea Clear Clear   Anterior Chamber ,, Tube 12 meridian Deep   Iris  NO NVI large trunks, no signs of new  NVI, updrawn pupil, 3.5 mm, undilated Old neovascularization of the iris 360 degrees, stable nonprogressive   Lens Posterior chamber intraocular lens 3.5+ Nuclear sclerosis   Anterior Vitreous Normal          Fundus Exam       Right Left   Posterior Vitreous Posterior vitreous detachment    Disc Normal    C/D Ratio 0.7    Macula Microaneurysms,, Retinal pigment epithelial atrophy, Macular thickening, no clinically significant macular edema    Vessels Proliferative diabetic retinopathy, quiet    Periphery  good panretinal photocoagulation, post recent PRP             IMAGING AND PROCEDURES  Imaging and Procedures for 08/27/21  Intravitreal Injection, Pharmacologic Agent - OD - Right Eye       Time  Out 08/27/2021. 9:58 AM. Confirmed correct patient, procedure, site, and patient consented.   Anesthesia Topical anesthesia was used. Anesthetic medications included Lidocaine 4%.   Procedure Preparation included Tobramycin 0.3%, 10% betadine to eyelids, 5% betadine to ocular surface. A 30 gauge needle was used.   Injection: 2.5 mg bevacizumab 2.5 MG/0.1ML   Route: Intravitreal, Site: Right Eye   NDC: [phone], Lot: [id number]   Post-op Post injection exam found visual acuity of at least counting fingers. The patient tolerated the procedure well. There were no complications. The patient received written and verbal post procedure care education. Post injection medications included ocuflox.      OCT, Retina - OU - Both Eyes       Right Eye Quality was good. Scan locations included subfoveal. Central Foveal Thickness: 303. Progression has been stable. Findings include cystoid macular edema.   Notes Chronic CSME, stable overall with no increase in CSME, center involved in the past, will repeat injection Avastin today at nearly 5-week interval to maintain             ASSESSMENT/PLAN:  Glaucoma with pupillary seclusion, unspecified laterality, severe stage Stable condition OD  Neovascular glaucoma, right eye, severe stage IOP well controlled, no active NVI  Proliferative diabetic retinopathy of right eye with macular edema associated with type 2 diabetes  mellitus ([place]) OD looks great at 5 weeks with minor perifoveal CSME repeat injection today Avastin to control CSME  No anterior segment neovascularization      ICD-10-CM   1. Proliferative diabetic retinopathy of right eye with macular edema associated with type 2 diabetes mellitus (HCC)  E11.3511 Intravitreal Injection, Pharmacologic Agent - OD - Right Eye    OCT, Retina - OU - Both Eyes    bevacizumab (AVASTIN) SOSY 2.5 mg    2. Glaucoma with pupillary seclusion, unspecified laterality, severe stage  H40.50X3     H21.40     3. Neovascular glaucoma, right eye, severe stage  H40.51X3       1.  OD, repeat injection today Avastin to control CSME from PDR.  With good acuity preserved  2.  We will extend interval next OD to 6 weeks and monitor anterior segment again for neovascularization of the iris developing or recurring while controlling the CSME  3.  Ophthalmic Meds Ordered this visit:  Meds ordered this encounter  Medications   bevacizumab (AVASTIN) SOSY 2.5 mg       Return in about 6 weeks (around 10/08/2021) for OD, AVASTIN OCT, NO DILATE.  There are no Patient Instructions on file for this visit.   Explained the diagnoses, plan, and follow up with the patient and they expressed understanding.  Patient expressed understanding of the importance of proper follow up care.   Clent Demark Lavada Langsam M.D. Diseases & Surgery of the Retina and Vitreous Retina & Diabetic [place] 08/27/21     Abbreviations: M myopia (nearsighted); A astigmatism; H hyperopia (farsighted); P presbyopia; Mrx spectacle prescription;  CTL contact lenses; OD right eye; OS left eye; OU both eyes  XT exotropia; ET esotropia; PEK punctate epithelial keratitis; PEE punctate epithelial erosions; DES dry eye syndrome; MGD meibomian gland dysfunction; ATs artificial tears; PFAT's preservative free artificial tears; [place] nuclear sclerotic cataract; PSC posterior subcapsular cataract; ERM epi-retinal membrane; PVD posterior vitreous detachment; RD retinal detachment; DM diabetes mellitus; DR diabetic retinopathy; NPDR non-proliferative diabetic retinopathy; PDR proliferative diabetic retinopathy; CSME clinically significant macular edema; DME diabetic macular edema; dbh dot blot hemorrhages; CWS cotton wool spot; POAG primary open angle glaucoma; C/D cup-to-disc ratio; HVF humphrey visual field; GVF goldmann visual field; OCT optical coherence tomography; IOP intraocular pressure; BRVO Branch retinal vein occlusion; CRVO central retinal  vein occlusion; CRAO central retinal artery occlusion; BRAO branch retinal artery occlusion; RT retinal tear; SB scleral buckle; PPV pars plana vitrectomy; VH Vitreous hemorrhage; PRP panretinal laser photocoagulation; IVK intravitreal kenalog; VMT vitreomacular traction; MH Macular hole;  NVD neovascularization of the disc; NVE neovascularization elsewhere; AREDS age related eye disease study; ARMD age related macular degeneration; POAG primary open angle glaucoma; EBMD epithelial/anterior basement membrane dystrophy; ACIOL anterior chamber intraocular lens; IOL intraocular lens; PCIOL posterior chamber intraocular lens; Phaco/IOL phacoemulsification with intraocular lens placement; [place] photorefractive keratectomy; LASIK laser assisted in situ keratomileusis; HTN hypertension; DM diabetes mellitus; COPD chronic obstructive pulmonary disease

## 2021-09-07 ENCOUNTER — Encounter (HOSPITAL_COMMUNITY): Payer: Self-pay

## 2021-09-07 ENCOUNTER — Emergency Department (HOSPITAL_COMMUNITY): Payer: 59

## 2021-09-07 ENCOUNTER — Other Ambulatory Visit: Payer: Self-pay

## 2021-09-07 ENCOUNTER — Inpatient Hospital Stay (HOSPITAL_COMMUNITY)
Admission: EM | Admit: 2021-09-07 | Discharge: 2021-09-16 | DRG: 280 | Disposition: A | Discharge status: Home Health | Payer: 59 | Attending: Internal Medicine | Admitting: Internal Medicine

## 2021-09-07 DIAGNOSIS — J189 Pneumonia, unspecified organism: Secondary | ICD-10-CM | POA: Diagnosis present

## 2021-09-07 DIAGNOSIS — Z28310 Unvaccinated for covid-19: Secondary | ICD-10-CM | POA: Diagnosis not present

## 2021-09-07 DIAGNOSIS — D62 Acute posthemorrhagic anemia: Secondary | ICD-10-CM | POA: Diagnosis not present

## 2021-09-07 DIAGNOSIS — Z794 Long term (current) use of insulin: Secondary | ICD-10-CM

## 2021-09-07 DIAGNOSIS — I5021 Acute systolic (congestive) heart failure: Secondary | ICD-10-CM | POA: Insufficient documentation

## 2021-09-07 DIAGNOSIS — N183 Chronic kidney disease, stage 3 unspecified: Secondary | ICD-10-CM | POA: Diagnosis not present

## 2021-09-07 DIAGNOSIS — J9621 Acute and chronic respiratory failure with hypoxia: Secondary | ICD-10-CM | POA: Diagnosis present

## 2021-09-07 DIAGNOSIS — I5032 Chronic diastolic (congestive) heart failure: Secondary | ICD-10-CM

## 2021-09-07 DIAGNOSIS — E1165 Type 2 diabetes mellitus with hyperglycemia: Secondary | ICD-10-CM | POA: Diagnosis present

## 2021-09-07 DIAGNOSIS — H409 Unspecified glaucoma: Secondary | ICD-10-CM | POA: Diagnosis present

## 2021-09-07 DIAGNOSIS — N1831 Chronic kidney disease, stage 3a: Secondary | ICD-10-CM | POA: Diagnosis not present

## 2021-09-07 DIAGNOSIS — Z955 Presence of coronary angioplasty implant and graft: Secondary | ICD-10-CM

## 2021-09-07 DIAGNOSIS — J9601 Acute respiratory failure with hypoxia: Secondary | ICD-10-CM | POA: Diagnosis not present

## 2021-09-07 DIAGNOSIS — E1169 Type 2 diabetes mellitus with other specified complication: Secondary | ICD-10-CM | POA: Insufficient documentation

## 2021-09-07 DIAGNOSIS — D649 Anemia, unspecified: Secondary | ICD-10-CM | POA: Diagnosis present

## 2021-09-07 DIAGNOSIS — D696 Thrombocytopenia, unspecified: Secondary | ICD-10-CM | POA: Diagnosis present

## 2021-09-07 DIAGNOSIS — N179 Acute kidney failure, unspecified: Secondary | ICD-10-CM | POA: Diagnosis present

## 2021-09-07 DIAGNOSIS — E785 Hyperlipidemia, unspecified: Secondary | ICD-10-CM | POA: Diagnosis present

## 2021-09-07 DIAGNOSIS — Z79899 Other long term (current) drug therapy: Secondary | ICD-10-CM

## 2021-09-07 DIAGNOSIS — I252 Old myocardial infarction: Secondary | ICD-10-CM

## 2021-09-07 DIAGNOSIS — R0602 Shortness of breath: Secondary | ICD-10-CM

## 2021-09-07 DIAGNOSIS — E7800 Pure hypercholesterolemia, unspecified: Secondary | ICD-10-CM | POA: Diagnosis present

## 2021-09-07 DIAGNOSIS — E1122 Type 2 diabetes mellitus with diabetic chronic kidney disease: Secondary | ICD-10-CM | POA: Diagnosis present

## 2021-09-07 DIAGNOSIS — E876 Hypokalemia: Secondary | ICD-10-CM | POA: Diagnosis present

## 2021-09-07 DIAGNOSIS — J440 Chronic obstructive pulmonary disease with acute lower respiratory infection: Secondary | ICD-10-CM | POA: Diagnosis present

## 2021-09-07 DIAGNOSIS — N1832 Chronic kidney disease, stage 3b: Secondary | ICD-10-CM | POA: Diagnosis present

## 2021-09-07 DIAGNOSIS — J9690 Respiratory failure, unspecified, unspecified whether with hypoxia or hypercapnia: Secondary | ICD-10-CM

## 2021-09-07 DIAGNOSIS — J989 Respiratory disorder, unspecified: Secondary | ICD-10-CM

## 2021-09-07 DIAGNOSIS — I10 Essential (primary) hypertension: Secondary | ICD-10-CM | POA: Diagnosis present

## 2021-09-07 DIAGNOSIS — J188 Other pneumonia, unspecified organism: Secondary | ICD-10-CM | POA: Diagnosis present

## 2021-09-07 DIAGNOSIS — I5043 Acute on chronic combined systolic (congestive) and diastolic (congestive) heart failure: Secondary | ICD-10-CM | POA: Diagnosis present

## 2021-09-07 DIAGNOSIS — I872 Venous insufficiency (chronic) (peripheral): Secondary | ICD-10-CM | POA: Diagnosis present

## 2021-09-07 DIAGNOSIS — Z841 Family history of disorders of kidney and ureter: Secondary | ICD-10-CM

## 2021-09-07 DIAGNOSIS — N189 Chronic kidney disease, unspecified: Secondary | ICD-10-CM | POA: Diagnosis not present

## 2021-09-07 DIAGNOSIS — I214 Non-ST elevation (NSTEMI) myocardial infarction: Principal | ICD-10-CM | POA: Diagnosis present

## 2021-09-07 DIAGNOSIS — E1142 Type 2 diabetes mellitus with diabetic polyneuropathy: Secondary | ICD-10-CM

## 2021-09-07 DIAGNOSIS — Z09 Encounter for follow-up examination after completed treatment for conditions other than malignant neoplasm: Secondary | ICD-10-CM

## 2021-09-07 DIAGNOSIS — E118 Type 2 diabetes mellitus with unspecified complications: Secondary | ICD-10-CM

## 2021-09-07 DIAGNOSIS — J441 Chronic obstructive pulmonary disease with (acute) exacerbation: Secondary | ICD-10-CM | POA: Diagnosis present

## 2021-09-07 DIAGNOSIS — I2510 Atherosclerotic heart disease of native coronary artery without angina pectoris: Secondary | ICD-10-CM | POA: Diagnosis present

## 2021-09-07 DIAGNOSIS — M1990 Unspecified osteoarthritis, unspecified site: Secondary | ICD-10-CM | POA: Diagnosis present

## 2021-09-07 DIAGNOSIS — I130 Hypertensive heart and chronic kidney disease with heart failure and stage 1 through stage 4 chronic kidney disease, or unspecified chronic kidney disease: Secondary | ICD-10-CM | POA: Diagnosis present

## 2021-09-07 DIAGNOSIS — Z833 Family history of diabetes mellitus: Secondary | ICD-10-CM

## 2021-09-07 DIAGNOSIS — D631 Anemia in chronic kidney disease: Secondary | ICD-10-CM | POA: Diagnosis present

## 2021-09-07 DIAGNOSIS — Z888 Allergy status to other drugs, medicaments and biological substances status: Secondary | ICD-10-CM

## 2021-09-07 DIAGNOSIS — Z20822 Contact with and (suspected) exposure to covid-19: Secondary | ICD-10-CM | POA: Diagnosis present

## 2021-09-07 DIAGNOSIS — I5033 Acute on chronic diastolic (congestive) heart failure: Secondary | ICD-10-CM | POA: Diagnosis present

## 2021-09-07 DIAGNOSIS — J810 Acute pulmonary edema: Secondary | ICD-10-CM | POA: Diagnosis not present

## 2021-09-07 DIAGNOSIS — J159 Unspecified bacterial pneumonia: Secondary | ICD-10-CM | POA: Diagnosis not present

## 2021-09-07 DIAGNOSIS — I25118 Atherosclerotic heart disease of native coronary artery with other forms of angina pectoris: Secondary | ICD-10-CM | POA: Diagnosis not present

## 2021-09-07 DIAGNOSIS — I5041 Acute combined systolic (congestive) and diastolic (congestive) heart failure: Secondary | ICD-10-CM | POA: Diagnosis not present

## 2021-09-07 DIAGNOSIS — Z8249 Family history of ischemic heart disease and other diseases of the circulatory system: Secondary | ICD-10-CM

## 2021-09-07 DIAGNOSIS — H548 Legal blindness, as defined in USA: Secondary | ICD-10-CM | POA: Diagnosis present

## 2021-09-07 DIAGNOSIS — Z7982 Long term (current) use of aspirin: Secondary | ICD-10-CM

## 2021-09-07 LAB — CBC WITH DIFFERENTIAL/PLATELET
Abs Immature Granulocytes: 0.03 10*3/uL (ref 0.00–0.07)
Basophils Absolute: 0 10*3/uL (ref 0.0–0.1)
Basophils Relative: 1 %
Eosinophils Absolute: 0 10*3/uL (ref 0.0–0.5)
Eosinophils Relative: 1 %
HCT: 29 % — ABNORMAL LOW (ref 39.0–52.0)
Hemoglobin: 9.6 g/dL — ABNORMAL LOW (ref 13.0–17.0)
Immature Granulocytes: 1 %
Lymphocytes Relative: 7 %
Lymphs Abs: 0.5 10*3/uL — ABNORMAL LOW (ref 0.7–4.0)
MCH: 28.6 pg (ref 26.0–34.0)
MCHC: 33.1 g/dL (ref 30.0–36.0)
MCV: 86.3 fL (ref 80.0–100.0)
Monocytes Absolute: 0.8 10*3/uL (ref 0.1–1.0)
Monocytes Relative: 12 %
Neutro Abs: 5.2 10*3/uL (ref 1.7–7.7)
Neutrophils Relative %: 78 %
Platelets: 130 10*3/uL — ABNORMAL LOW (ref 150–400)
RBC: 3.36 MIL/uL — ABNORMAL LOW (ref 4.22–5.81)
RDW: 14.3 % (ref 11.5–15.5)
WBC: 6.6 10*3/uL (ref 4.0–10.5)
nRBC: 0 % (ref 0.0–0.2)

## 2021-09-07 LAB — COMPREHENSIVE METABOLIC PANEL
ALT: 20 U/L (ref 0–44)
AST: 33 U/L (ref 15–41)
Albumin: 3.2 g/dL — ABNORMAL LOW (ref 3.5–5.0)
Alkaline Phosphatase: 67 U/L (ref 38–126)
Anion gap: 10 (ref 5–15)
BUN: 37 mg/dL — ABNORMAL HIGH (ref 8–23)
CO2: 27 mmol/L (ref 22–32)
Calcium: 8.1 mg/dL — ABNORMAL LOW (ref 8.9–10.3)
Chloride: 102 mmol/L (ref 98–111)
Creatinine, Ser: 2.22 mg/dL — ABNORMAL HIGH (ref 0.61–1.24)
GFR, Estimated: 32 mL/min — ABNORMAL LOW (ref >60)
Glucose, Bld: 165 mg/dL — ABNORMAL HIGH (ref 70–99)
Potassium: 3.5 mmol/L (ref 3.5–5.1)
Sodium: 139 mmol/L (ref 135–145)
Total Bilirubin: 0.5 mg/dL (ref 0.3–1.2)
Total Protein: 6.4 g/dL — ABNORMAL LOW (ref 6.5–8.1)

## 2021-09-07 LAB — URINALYSIS, ROUTINE W REFLEX MICROSCOPIC
Bacteria, UA: NONE SEEN
Bilirubin Urine: NEGATIVE
Glucose, UA: 50 mg/dL — AB
Ketones, ur: NEGATIVE mg/dL
Leukocytes,Ua: NEGATIVE
Nitrite: NEGATIVE
Protein, ur: >=300 mg/dL — AB
Specific Gravity, Urine: 1.014 (ref 1.005–1.030)
pH: 6 (ref 5.0–8.0)

## 2021-09-07 LAB — POC OCCULT BLOOD, ED: Fecal Occult Bld: NEGATIVE

## 2021-09-07 LAB — TROPONIN I (HIGH SENSITIVITY)
Troponin I (High Sensitivity): 141 ng/L (ref <18)
Troponin I (High Sensitivity): 333 ng/L (ref <18)
Troponin I (High Sensitivity): 714 ng/L (ref <18)

## 2021-09-07 LAB — CBG MONITORING, ED
Glucose-Capillary: 113 mg/dL — ABNORMAL HIGH (ref 70–99)
Glucose-Capillary: 87 mg/dL (ref 70–99)

## 2021-09-07 LAB — RESP PANEL BY RT-PCR (FLU A&B, COVID) ARPGX2
Influenza A by PCR: NEGATIVE
Influenza B by PCR: NEGATIVE
SARS Coronavirus 2 by RT PCR: NEGATIVE

## 2021-09-07 LAB — BRAIN NATRIURETIC PEPTIDE: B Natriuretic Peptide: 620.6 pg/mL — ABNORMAL HIGH (ref 0.0–100.0)

## 2021-09-07 LAB — LIPASE, BLOOD: Lipase: 51 U/L (ref 11–51)

## 2021-09-07 MED ORDER — INSULIN ASPART 100 UNIT/ML IJ SOLN
10.0000 [IU] | Freq: Three times a day (TID) | INTRAMUSCULAR | Status: DC
  Administered 2021-09-07: 10 [IU] via SUBCUTANEOUS

## 2021-09-07 MED ORDER — INSULIN ASPART 100 UNIT/ML IJ SOLN
0.0000 [IU] | Freq: Three times a day (TID) | INTRAMUSCULAR | Status: DC
  Administered 2021-09-08: 3 [IU] via SUBCUTANEOUS

## 2021-09-07 MED ORDER — INSULIN GLARGINE 100 UNIT/ML SOLOSTAR PEN: 38.0000 [IU] | PEN_INJECTOR | Freq: Every day | SUBCUTANEOUS | Status: DC

## 2021-09-07 MED ORDER — SODIUM CHLORIDE 0.9 % IV SOLN
500.0000 mg | Freq: Once | INTRAVENOUS | Status: AC
  Administered 2021-09-07: 500 mg via INTRAVENOUS
  Filled 2021-09-07: qty 5

## 2021-09-07 MED ORDER — ASPIRIN 81 MG PO CHEW
324.0000 mg | CHEWABLE_TABLET | Freq: Once | ORAL | Status: AC
  Administered 2021-09-07: 324 mg via ORAL
  Filled 2021-09-07: qty 4

## 2021-09-07 MED ORDER — SODIUM CHLORIDE 0.9 % IV SOLN
2.0000 g | INTRAVENOUS | Status: AC
  Administered 2021-09-08 – 2021-09-12 (×5): 2 g via INTRAVENOUS
  Filled 2021-09-07 (×6): qty 20

## 2021-09-07 MED ORDER — AEROCHAMBER PLUS FLO-VU LARGE MISC
1.0000 | Freq: Once | Status: AC
  Administered 2021-09-08: 1

## 2021-09-07 MED ORDER — AZITHROMYCIN 250 MG PO TABS: 500.0000 mg | ORAL_TABLET | Freq: Every day | ORAL | Status: DC

## 2021-09-07 MED ORDER — SODIUM CHLORIDE 0.9 % IV SOLN: 2.0000 g | INTRAVENOUS | Status: DC

## 2021-09-07 MED ORDER — ACETAMINOPHEN 650 MG RE SUPP: 650.0000 mg | Freq: Four times a day (QID) | RECTAL | Status: DC | PRN

## 2021-09-07 MED ORDER — DORZOLAMIDE HCL 2 % OP SOLN
1.0000 [drp] | Freq: Two times a day (BID) | OPHTHALMIC | Status: DC
  Administered 2021-09-07 – 2021-09-16 (×16): 1 [drp] via OPHTHALMIC
  Filled 2021-09-07 (×2): qty 10

## 2021-09-07 MED ORDER — BRIMONIDINE TARTRATE 0.2 % OP SOLN
1.0000 [drp] | Freq: Two times a day (BID) | OPHTHALMIC | Status: DC
  Administered 2021-09-07 – 2021-09-16 (×17): 1 [drp] via OPHTHALMIC
  Filled 2021-09-07 (×2): qty 5

## 2021-09-07 MED ORDER — NETARSUDIL DIMESYLATE 0.02 % OP SOLN: 1.0000 [drp] | Freq: Every day | OPHTHALMIC | Status: DC

## 2021-09-07 MED ORDER — AMLODIPINE BESYLATE 10 MG PO TABS
10.0000 mg | ORAL_TABLET | Freq: Every day | ORAL | Status: DC
  Administered 2021-09-08 – 2021-09-09 (×2): 10 mg via ORAL
  Filled 2021-09-07: qty 2
  Filled 2021-09-07: qty 1

## 2021-09-07 MED ORDER — ONDANSETRON HCL 4 MG/2ML IJ SOLN
4.0000 mg | Freq: Once | INTRAMUSCULAR | Status: AC
  Administered 2021-09-07: 4 mg via INTRAVENOUS
  Filled 2021-09-07: qty 2

## 2021-09-07 MED ORDER — ASPIRIN EC 81 MG PO TBEC
81.0000 mg | DELAYED_RELEASE_TABLET | Freq: Every day | ORAL | Status: DC
  Administered 2021-09-08 – 2021-09-16 (×9): 81 mg via ORAL
  Filled 2021-09-07 (×10): qty 1

## 2021-09-07 MED ORDER — HYDROCOD POLI-CHLORPHE POLI ER 10-8 MG/5ML PO SUER
5.0000 mL | Freq: Once | ORAL | Status: AC
  Administered 2021-09-07: 5 mL via ORAL
  Filled 2021-09-07: qty 5

## 2021-09-07 MED ORDER — INSULIN GLARGINE-YFGN 100 UNIT/ML ~~LOC~~ SOLN
38.0000 [IU] | Freq: Every day | SUBCUTANEOUS | Status: DC
  Administered 2021-09-07: 38 [IU] via SUBCUTANEOUS
  Filled 2021-09-07 (×3): qty 0.38

## 2021-09-07 MED ORDER — TIMOLOL MALEATE 0.5 % OP SOLN
1.0000 [drp] | Freq: Two times a day (BID) | OPHTHALMIC | Status: DC
  Administered 2021-09-07 – 2021-09-16 (×18): 1 [drp] via OPHTHALMIC
  Filled 2021-09-07 (×3): qty 5

## 2021-09-07 MED ORDER — SENNA 8.6 MG PO TABS
1.0000 | ORAL_TABLET | Freq: Two times a day (BID) | ORAL | Status: DC
  Administered 2021-09-07 – 2021-09-16 (×16): 8.6 mg via ORAL
  Filled 2021-09-07 (×17): qty 1

## 2021-09-07 MED ORDER — HYDRALAZINE HCL 50 MG PO TABS
75.0000 mg | ORAL_TABLET | Freq: Three times a day (TID) | ORAL | Status: DC
  Administered 2021-09-07 – 2021-09-09 (×4): 75 mg via ORAL
  Filled 2021-09-07 (×2): qty 3
  Filled 2021-09-07 (×2): qty 1
  Filled 2021-09-07: qty 3

## 2021-09-07 MED ORDER — SODIUM CHLORIDE 0.45 % IV SOLN: INTRAVENOUS | Status: DC

## 2021-09-07 MED ORDER — ALBUTEROL SULFATE HFA 108 (90 BASE) MCG/ACT IN AERS
2.0000 | INHALATION_SPRAY | RESPIRATORY_TRACT | Status: DC | PRN
  Administered 2021-09-08: 2 via RESPIRATORY_TRACT
  Filled 2021-09-07: qty 6.7

## 2021-09-07 MED ORDER — FUROSEMIDE 10 MG/ML IJ SOLN
40.0000 mg | Freq: Two times a day (BID) | INTRAMUSCULAR | Status: DC
  Administered 2021-09-07: 40 mg via INTRAVENOUS
  Filled 2021-09-07: qty 4

## 2021-09-07 MED ORDER — ENOXAPARIN SODIUM 40 MG/0.4ML IJ SOSY
40.0000 mg | PREFILLED_SYRINGE | INTRAMUSCULAR | Status: DC
  Administered 2021-09-07: 40 mg via SUBCUTANEOUS
  Filled 2021-09-07: qty 0.4

## 2021-09-07 MED ORDER — ACETAMINOPHEN 325 MG PO TABS
650.0000 mg | ORAL_TABLET | Freq: Four times a day (QID) | ORAL | Status: DC | PRN
  Administered 2021-09-09 – 2021-09-14 (×7): 650 mg via ORAL
  Filled 2021-09-07 (×7): qty 2

## 2021-09-07 MED ORDER — ATORVASTATIN CALCIUM 40 MG PO TABS
40.0000 mg | ORAL_TABLET | Freq: Every day | ORAL | Status: DC
  Administered 2021-09-08 – 2021-09-16 (×9): 40 mg via ORAL
  Filled 2021-09-07 (×9): qty 1

## 2021-09-07 MED ORDER — AZITHROMYCIN 250 MG PO TABS
500.0000 mg | ORAL_TABLET | Freq: Every day | ORAL | Status: AC
  Administered 2021-09-08 – 2021-09-12 (×5): 500 mg via ORAL
  Filled 2021-09-07: qty 1
  Filled 2021-09-07 (×2): qty 2
  Filled 2021-09-07: qty 1
  Filled 2021-09-07 (×2): qty 2
  Filled 2021-09-07 (×2): qty 1

## 2021-09-07 MED ORDER — MORPHINE SULFATE (PF) 4 MG/ML IV SOLN
4.0000 mg | Freq: Once | INTRAVENOUS | Status: AC
  Administered 2021-09-07: 4 mg via INTRAVENOUS
  Filled 2021-09-07: qty 1

## 2021-09-07 MED ORDER — AMMONIUM LACTATE 12 % EX LOTN
1.0000 "application " | TOPICAL_LOTION | CUTANEOUS | Status: DC | PRN
  Filled 2021-09-07: qty 225

## 2021-09-07 MED ORDER — SODIUM CHLORIDE 0.9 % IV SOLN
1.0000 g | Freq: Once | INTRAVENOUS | Status: AC
  Administered 2021-09-07: 1 g via INTRAVENOUS
  Filled 2021-09-07: qty 10

## 2021-09-07 MED ORDER — INSULIN LISPRO (1 UNIT DIAL) 100 UNIT/ML (KWIKPEN)
10.0000 [IU] | PEN_INJECTOR | Freq: Three times a day (TID) | SUBCUTANEOUS | Status: DC
  Filled 2021-09-07: qty 3

## 2021-09-07 NOTE — ED Notes (Signed)
Troponin 141 reported to pa

## 2021-09-07 NOTE — H&P (Signed)
History and Physical    Derek Lowery JTT:[id number] DOB: [birth year] DOA: 09/07/2021  PCP: Ladell Pier, MD   Patient coming from: home  I have personally briefly reviewed patient's old medical records in [place]  Chief Complaint: short of breath  HPI: Derek Lowery is a 66 y.o. male with medical history significant of DM, HLD, HTN, CAD s/p NSTEMI with PCI/DES-Stenting OM2 2012, HFpEF, COPD, HTN reports he has been ill for several days. He endorses a mildly productive cough, denies rigors, chest pain. His symptoms were progressive and he was increasingly SOB. EMS activated. On EMS arrival he required 100% Rebreather mask. He was transported to [facility] for further evaluation and treatment   ED Course: T 98.6  154/86  HR 95  RR 27. BMI 35.7. CXR with multifocal pneumonia. CT chest confirms multifocal pneumonia, worse posterior RUL, interstial edema and small pleural effusion c/w CHF. Lab: WBC 6.6, Glucose 165, BUN 37, Cr 2.22, lipase 23, BNP 621, Troponin 141. Abx initiated with Rocephin 2g, Azithromycin 500 mg IV.  Patient continues on [place] oxygen. TRH called to admit for continued treatment.   Review of Systems: As per HPI otherwise 10 point review of systems negative.    Past Medical History:  Diagnosis Date   Arthritis    CAD (coronary artery disease)    NSTEMI 06/2011:  LHC 07/18/11: Proximal diagonal 60%, distal LAD with a diabetic appearance and 60% stenosis, OM2 with an occluded superior branch and an inferior branch with 90%, EF 55% with inferior hypokinesis.  PCI: Promus DES to the OM2 inferior branch.  This vessel provides collaterals to the superior branch which remained occluded.  Echocardiogram 07/18/11: EF 60%, normal wall motion.   Cataract    Essential hypertension, benign    Glaucoma    Hypercholesteremia    Internal hemorrhoids    Noncompliance    Type 2 diabetes mellitus ([place]) 1990    Past Surgical History:  Procedure Laterality Date   CATARACT  EXTRACTION Right 2019   Dr. Katy Fitch   CHOLECYSTECTOMY N/A 09/21/2017   Procedure: LAPAROSCOPIC CHOLECYSTECTOMY WITH INTRAOPERATIVE CHOLANGIOGRAM;  Surgeon: Bill Mcvey Boston, MD;  Location: [facility];  Service: General;  Laterality: N/A;   COLONOSCOPY  2000   Dr. Collene Mares   EYE SURGERY     IRRIGATION AND DEBRIDEMENT ABSCESS Left 05/05/2014   Procedure: IRRIGATION AND DEBRIDEMENT ABSCESS left buttock;  Surgeon: Salil Raineri Boston, MD;  Location: [facility];  Service: General;  Laterality: Left;   LEFT HEART CATHETERIZATION WITH CORONARY ANGIOGRAM N/A 07/18/2011   Procedure: LEFT HEART CATHETERIZATION WITH CORONARY ANGIOGRAM;  Surgeon: Hillary Bow, MD;  Location: [facility] CATH LAB;  Service: Cardiovascular;  Laterality: N/A;   PERCUTANEOUS CORONARY STENT INTERVENTION (PCI-S)  07/18/2011   Procedure: PERCUTANEOUS CORONARY STENT INTERVENTION (PCI-S);  Surgeon: Hillary Bow, MD;  Location: [facility] CATH LAB;  Service: Cardiovascular;;    Soc Hx - life long batchelor. LIves with his sister and nephew. 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 Now on disabilty   reports that he has never smoked. He has never used smokeless tobacco. He reports that he does not drink alcohol and does not use drugs.  Allergies  Allergen Reactions   Coreg [Carvedilol] Other (See Comments)    Redness and swelling of calf    Family History  Problem Relation Age of Onset   Coronary artery disease Father  Developed in his 23s   Kidney disease Father    Diabetes Father    Melanoma Father    Rectal cancer Father    Heart failure Mother    Hypertension Mother    Diabetes Sister    Diabetes Brother    Colon cancer Neg Hx    Esophageal cancer Neg Hx    Stomach cancer Neg Hx      Prior to Admission medications   Medication Sig Start Date End Date Taking? Authorizing Provider  amLODipine (NORVASC) 10 MG tablet Take 1 tablet (10 mg  total) by mouth daily. 06/17/21  Yes Ladell Pier, MD  ammonium lactate (AMLACTIN) 12 % lotion Apply to both feet twice daily for dry skin. Patient taking differently: 1 application as needed for dry skin. Apply to both feet twice daily for dry skin. 11/16/20  Yes Ladell Pier, MD  aspirin EC 81 MG tablet Take 1 tablet (81 mg total) by mouth daily. 07/12/19  Yes McClung, Angela M, PA-C  atorvastatin (LIPITOR) 40 MG tablet TAKE 1 TABLET BY MOUTH EVERY DAY Patient taking differently: Take 40 mg by mouth daily. TAKE 1 TABLET BY MOUTH EVERY DAY 03/17/21  Yes Ladell Pier, MD  brimonidine (ALPHAGAN) 0.2 % ophthalmic solution Place 1 drop into the right eye 2 (two) times daily.  07/23/18  Yes [provider]  dorzolamide (TRUSOPT) 2 % ophthalmic solution Place 1 drop into the right eye 2 (two) times daily.   Yes [provider]  HUMALOG KWIKPEN 100 UNIT/ML KwikPen INJECT 10 UNITS INTO THE SKIN 3 (THREE) TIMES DAILY WITH MEALS. Patient taking differently: Inject 10 Units into the skin 3 (three) times daily. 07/31/21  Yes Ladell Pier, MD  hydrALAZINE (APRESOLINE) 50 MG tablet Take 1.5 tablets (75 mg total) by mouth 3 (three) times daily. 06/17/21  Yes Ladell Pier, MD  insulin glargine (LANTUS SOLOSTAR) 100 UNIT/ML Solostar Pen Inject 38 Units into the skin at bedtime. 11/16/20  Yes Ladell Pier, MD  Multiple Vitamins-Minerals (MULTIVITAMIN WITH MINERALS) tablet Take 1 tablet by mouth daily.   Yes [provider]  RHOPRESSA 0.02 % SOLN Place 1 drop into the right eye at bedtime. 01/24/20  Yes [provider]  timolol (TIMOPTIC) 0.5 % ophthalmic solution Place 1 drop into the right eye 2 (two) times daily. 07/09/21  Yes [provider]  BD PEN NEEDLE NANO 2ND GEN 32G X 4 MM MISC USE AS DIRECTED 3 TIMES A DAY. PLEASE SCHEDULE AN APPOINTMENT FOR ADDITIONAL REFILLS. 01/02/21   Ladell Pier, MD  blood glucose meter kit and supplies KIT  Dispense based on patient and insurance preference. Use up to four times daily as directed. One Touch Verio 09/24/18   Ladell Pier, MD  Continuous Blood Gluc Receiver (DEXCOM G6 RECEIVER) DEVI 1 Device by Does not apply route daily. 06/17/21   Ladell Pier, MD  Continuous Blood Gluc Receiver (FREESTYLE LIBRE 14 DAY READER) DEVI USE AS DIRECTED 12/19/20   Ladell Pier, MD  Continuous Blood Gluc Sensor (DEXCOM G6 SENSOR) MISC 1 packet by Does not apply route daily. 06/17/21   Ladell Pier, MD  Continuous Blood Gluc Sensor (FREESTYLE LIBRE SENSOR SYSTEM) MISC Change sensor Q 2 wks 12/19/20   Ladell Pier, MD  glucose blood (ACCU-CHEK AVIVA PLUS) test strip Use as instructed for 3 times daily testing of blood sugar. E11.9 10/20/17   Ladell Pier, MD  glucose monitoring kit (FREESTYLE) monitoring  kit 1 each by Does not apply route as needed for other. 06/24/18   Ladell Pier, MD  Lancets MISC Use as directed.  Accu chek 2 12/21/17   Ladell Pier, MD  latanoprost (XALATAN) 0.005 % ophthalmic solution Place 1 drop into the right eye at bedtime. 12/13/20   [provider]  torsemide (DEMADEX) 20 MG tablet TAKE 2 TABLETS (40 MG TOTAL) BY MOUTH EVERY DAY Patient taking differently: Take 40 mg by mouth daily. 06/17/21   Ladell Pier, MD    Physical Exam: Vitals:   09/07/21 1630 09/07/21 1645 09/07/21 1700 09/07/21 1706  BP: (!) 158/85 (!) 145/100 (!) 153/91   Pulse: 90 92 92 92  Resp: (!) 29 (!) 32 (!) 25 15  Temp:      TempSrc:      SpO2: (!) 85% 92%  94%  Weight:      Height:        Vitals:   09/07/21 1630 09/07/21 1645 09/07/21 1700 09/07/21 1706  BP: (!) 158/85 (!) 145/100 (!) 153/91   Pulse: 90 92 92 92  Resp: (!) 29 (!) 32 (!) 25 15  Temp:      TempSrc:      SpO2: (!) 85% 92%  94%  Weight:      Height:       General: - overweight man in no distress Eyes: PERRL, lids and conjunctivae normal ENMT: Mucous membranes are moist.  Posterior pharynx clear of any exudate or lesions.Missing many teeth. NO oral lesions. .  Neck: normal, supple, no masses, no thyromegaly Respiratory: Normal respiratory effort. No accessory muscle use. Expiratory rhonchi, no rales, no wheezing, decreased breath sounds at bases L>R. Cardiovascular: Regular rate and rhythm, no murmurs / rubs / gallops. LE extremity edema. trace pedal pulses. No carotid bruits.  Abdomen: obese, no tenderness, no masses palpated. No hepatosplenomegaly. Bowel sounds positive.  Musculoskeletal: no clubbing / cyanosis. No joint deformity upper and lower extremities. Good ROM, no contractures. Normal muscle tone.  Skin: Chronic skin changes distal LE.  Neurologic: CN 2-12 grossly intact. Strength 5/5 in all 4.  Psychiatric: Normal judgment and insight. Alert and oriented x 3. Normal mood.     Labs on Admission: I have personally reviewed following labs and imaging studies  CBC: Recent Labs  Lab 09/07/21 1030  WBC 6.6  NEUTROABS 5.2  HGB 9.6*  HCT 29.0*  MCV 86.3  PLT 378*   Basic Metabolic Panel: Recent Labs  Lab 09/07/21 1030  NA 139  K 3.5  CL 102  CO2 27  GLUCOSE 165*  BUN 37*  CREATININE 2.22*  CALCIUM 8.1*   GFR: Estimated Creatinine Clearance: 42.9 mL/min (A) (by C-G formula based on SCr of 2.22 mg/dL (H)). Liver Function Tests: Recent Labs  Lab 09/07/21 1030  AST 33  ALT 20  ALKPHOS 67  BILITOT 0.5  PROT 6.4*  ALBUMIN 3.2*   Recent Labs  Lab 09/07/21 1030  LIPASE 51   No results for input(s): AMMONIA in the last 168 hours. Coagulation Profile: No results for input(s): INR, PROTIME in the last 168 hours. Cardiac Enzymes: No results for input(s): CKTOTAL, CKMB, CKMBINDEX, TROPONINI in the last 168 hours. BNP (last 3 results) No results for input(s): PROBNP in the last 8760 hours. HbA1C: No results for input(s): HGBA1C in the last 72 hours. CBG: Recent Labs  Lab 09/07/21 1708  GLUCAP 113*   Lipid Profile: No  results for input(s): CHOL, HDL, LDLCALC, TRIG, CHOLHDL,  LDLDIRECT in the last 72 hours. Thyroid Function Tests: No results for input(s): TSH, T4TOTAL, FREET4, T3FREE, THYROIDAB in the last 72 hours. Anemia Panel: No results for input(s): VITAMINB12, FOLATE, FERRITIN, TIBC, IRON, RETICCTPCT in the last 72 hours. Urine analysis:    Component Value Date/Time   COLORURINE YELLOW 09/07/2021 1034   APPEARANCEUR CLEAR 09/07/2021 1034   LABSPEC 1.014 09/07/2021 1034   PHURINE 6.0 09/07/2021 1034   GLUCOSEU 50 (A) 09/07/2021 1034   HGBUR SMALL (A) 09/07/2021 1034   BILIRUBINUR NEGATIVE 09/07/2021 1034   BILIRUBINUR neg 12/13/2014 1447   KETONESUR NEGATIVE 09/07/2021 1034   PROTEINUR >=300 (A) 09/07/2021 1034   UROBILINOGEN 1.0 12/13/2014 1447   UROBILINOGEN 1.0 05/05/2014 0630   NITRITE NEGATIVE 09/07/2021 1034   LEUKOCYTESUR NEGATIVE 09/07/2021 1034    Radiological Exams on Admission: CT Chest Wo Contrast  Result Date: 09/07/2021 CLINICAL DATA:  Evaluate pneumonia. EXAM: CT CHEST WITHOUT CONTRAST TECHNIQUE: Multidetector CT imaging of the chest was performed following the standard protocol without IV contrast. RADIATION DOSE REDUCTION: This exam was performed according to the departmental dose-optimization program which includes automated exposure control, adjustment of the mA and/or kV according to patient size and/or use of iterative reconstruction technique. COMPARISON:  Chest radiograph from earlier today FINDINGS: Cardiovascular: Mild cardiac enlargement. Aortic atherosclerosis. 3 vessel coronary artery calcifications. No pericardial effusion. Mediastinum/Nodes: Normal appearance of the thyroid gland. The trachea appears patent and is midline. Normal appearance of the esophagus. No enlarged axillary or supraclavicular lymph nodes. Prominent mediastinal lymph nodes are identified. Index right paratracheal lymph node measures 1.5 cm, image 55/3. Left pre-vascular lymph node measures 1.2 cm,  image 53/5. The hilar lymph nodes are suboptimally evaluated due to lack of IV contrast material. Lungs/Pleura: Trace bilateral pleural effusions. There is interlobular septal thickening within both lungs compatible with interstitial edema. Multifocal patchy areas of ground-glass and airspace consolidation are noted within both lungs. This is most severe within the posterior right upper lobe and posteromedial right lower lobe. Upper Abdomen: No acute abnormality within the imaged portions of the upper abdomen. Aortic atherosclerosis. Status post cholecystectomy. Musculoskeletal: No chest wall mass or suspicious bone lesions identified. IMPRESSION: 1. Multifocal patchy areas of ground-glass and airspace consolidation are noted within both lungs, most severe within the posterior right upper lobe and posteromedial right lower lobe. Findings are favored to represent multifocal pneumonia. Follow-up imaging is recommended to ensure resolution. 2. Cardiac enlargement, interstitial edema and trace bilateral pleural effusions compatible with CHF. 3. 3 vessel coronary artery calcifications. 4. Mildly enlarged mediastinal lymph nodes, nonspecific in the setting of multifocal. Infection and CHF. 5. Aortic Atherosclerosis (ICD10-I70.0). Electronically Signed   By: Kerby Moors M.D.   On: 09/07/2021 13:03   DG Chest Portable 1 View  Result Date: 09/07/2021 CLINICAL DATA:  Productive cough.  Chest pain and abdominal pain. EXAM: PORTABLE CHEST 1 VIEW COMPARISON:  12/24/2020 FINDINGS: Stable cardiomediastinal contours. Similar to study from 06/29/2020 there are recurrent, diffuse reticular and nodular opacities identified throughout both lungs. No focal lobar consolidation identified. No signs of pleural effusion or interstitial edema. IMPRESSION: Diffuse bilateral reticulonodular pulmonary opacities are noted bilaterally. Imaging findings are worrisome for multifocal pneumonia including atypical viral pneumonia. Follow-up  imaging is advised to ensure resolution. If this does not resolve then further evaluation with CT of the chest is recommended to assess for underlying malignancy. Electronically Signed   By: Kerby Moors M.D.   On: 09/07/2021 10:56    EKG: Independently reviewed. Sinus rhythm, RBBB,  w/o STEMI  Assessment/Plan Principal Problem:   Acute on chronic diastolic heart failure (HCC) Active Problems:   Multifocal pneumonia   Acute on chronic diastolic CHF (congestive heart failure) (HCC)   Type 2 diabetes mellitus with complication, with long-term current use of insulin (HCC)   HLD (hyperlipidemia)   Coronary artery disease of native artery of native heart with stable angina pectoris ([place])   Essential hypertension   Anemia   CKD (chronic kidney disease), stage III ([place])   1. Multifocal pneumonia - radiographic evidence, hypoxemia, sputum production. No leukocytosis suggesting possible viral infection.  Plan Continue Abx: Rocephin and Azithromycin  Procalcitonin baseline and daily x 2  Sputum culture  Nebs as needed  Continue [place] O2 to keep at 88% or better  IS while awake  2. Acute on chronic diastolic heart failure - elevated BNP, interstial edema on CT. PE unremarkale. Plan IV lasix x 3 doses, then resume home regimen  F/u BNP in AM  Continue home meds  3. CAD- patient with MI 2012. NO chest pain or discomfort. Elevated troponin most likely 2/2 hypoxemia and strain. No acute changes on EKG Plan Cycle troponins  Continue home meds  4. DM- patient on insulin WC and basal. CBGs OK Plan A1C - last in October '22 7/1%  SS coverage  Continue home regimen  5. CKD III - patient with rising Cr, acutely may be due to acute illness but chronically due to DM. He is scheduled, by his report, to see nephrology Friday, 1/09/24/21  6. HTN- continue home meds  7. HLD - continue statins    DVT prophylaxis: continue Heparin full dose until ACS ruled out, then lovenox  Code Status: full  code Family Communication: emergency contact numbers not working  Disposition Plan: home when stable  Consults called: cardiology - EDP reports placing consult  Admission status: inpatient - tele    Adella Hare MD [facility] Pager [phone]  If 7PM-7AM, please contact night-coverage [email or web address] Password [facility]  09/07/2021, 5:14 PM   home

## 2021-09-07 NOTE — ED Notes (Signed)
Pt given ice per request

## 2021-09-07 NOTE — ED Notes (Signed)
Pt in bed with [place] applied. Pt coughing. Pt c/o  productive cough, CP, SOB and leg swelling x 2 days. LS rhonchi throughout.

## 2021-09-07 NOTE — ED Triage Notes (Signed)
Pt here via EMS from home d/t chest pain and abdominal pain. Pt coughing X2 days. Productive cough. Increased bilateral leg swelling increased with pain. Taking OTC medications with no relief. Pt endorses doing more house work and may have strained something. Also endorses being about people with colds.

## 2021-09-07 NOTE — ED Notes (Signed)
When ambulate patient withouto2 sats drop to 70% hr-73.walked well his cane.when walk back to room hooked back up to o2 his sats went back upto95%at 2.5 liters95 hr.

## 2021-09-07 NOTE — ED Notes (Signed)
Pt still coughing, SPO2 decreasing. O2 increased to 5LPM. Pt continues to deny SOB, c/o only of cough

## 2021-09-07 NOTE — ED Provider Notes (Signed)
[place] EMERGENCY DEPARTMENT Provider Note   CSN: [id number] Arrival date & time: 09/07/21  1019     History  Chief Complaint  Patient presents with   cough/cp   Chest Pain   Abdominal Pain    Derek Lowery is a 66 y.o. male with a past medical history of CHF, CKD stage III, type 2 diabetes, hypertension, CAD status post stent, hyperlipidemia, status postcholecystectomy.  Per chart review last echocardiogram performed 12/2020 showed EF 60 to 65%.  Patient presents to the emergency department with a chief complaint of epigastric pain, productive cough, chest pain, and leg swelling.  Patient reports that pain is located to epigastric area.  Pain started yesterday evening.  Patient reports that he had a sudden onset of pain.  Pain has been intermittent since then.  Patient states that pain is worse with movement such as bending over or turning in bed.  On arrival patient rated pain 8/10 on pain scale.  Patient endorses associated nausea.  Patient denies any associated vomiting, constipation, diarrhea, melena, blood in stool, dysuria, hematuria, urinary urgency, swelling or tenderness to genitals, genital sores or lesions.  Patient reports that chest pain has been intermittent since last night.  Patient states that pain is present when coughing or when moving such as bending over or turning in bed.  Pain is located throughout entire chest.  Patient reports rhinorrhea, nasal congestion, and productive cough over the last 2 days.  States that cough is producing clear mucus.  Denies any hemoptysis.  Patient states that he has been around individuals who are sick with colds.  Patient reports that he has been vaccinated for influenza however has not been vaccinated for COVID-19.  Patient reports that he has had swelling to bilateral lower extremities.  Swelling has gotten progressively worse over the last month.  Patient states that he has been taking his torsemide  medication as prescribed.  Denies any alcohol, EtOH, or tobacco use.   Chest Pain Associated symptoms: abdominal pain, cough, nausea and shortness of breath   Associated symptoms: no back pain, no dizziness, no fever, no headache, no palpitations and no vomiting   Abdominal Pain Associated symptoms: chest pain, cough, nausea and shortness of breath   Associated symptoms: no chills, no constipation, no diarrhea, no dysuria, no fever, no hematuria, no sore throat and no vomiting       Home Medications Prior to Admission medications   Medication Sig Start Date End Date Taking? Authorizing Provider  amLODipine (NORVASC) 10 MG tablet Take 1 tablet (10 mg total) by mouth daily. 06/17/21   Ladell Pier, MD  ammonium lactate (AMLACTIN) 12 % lotion Apply to both feet twice daily for dry skin. 11/16/20   Ladell Pier, MD  aspirin EC 81 MG tablet Take 1 tablet (81 mg total) by mouth daily. 07/12/19   Argentina Donovan, PA-C  atorvastatin (LIPITOR) 40 MG tablet TAKE 1 TABLET BY MOUTH EVERY DAY 03/17/21   Ladell Pier, MD  BD PEN NEEDLE NANO 2ND GEN 32G X 4 MM MISC USE AS DIRECTED 3 TIMES A DAY. PLEASE SCHEDULE AN APPOINTMENT FOR ADDITIONAL REFILLS. 01/02/21   Ladell Pier, MD  blood glucose meter kit and supplies KIT Dispense based on patient and insurance preference. Use up to four times daily as directed. One Touch Verio 09/24/18   Ladell Pier, MD  brimonidine [facility]) 0.2 % ophthalmic solution Place 1 drop into the right eye 2 (two) times daily.  07/23/18   [provider]  Continuous Blood Gluc Receiver (DEXCOM G6 RECEIVER) DEVI 1 Device by Does not apply route daily. 06/17/21   Ladell Pier, MD  Continuous Blood Gluc Receiver (FREESTYLE LIBRE 14 DAY READER) DEVI USE AS DIRECTED 12/19/20   Ladell Pier, MD  Continuous Blood Gluc Sensor (DEXCOM G6 SENSOR) MISC 1 packet by Does not apply route daily. 06/17/21   Ladell Pier, MD  Continuous Blood  Gluc Sensor (FREESTYLE LIBRE SENSOR SYSTEM) MISC Change sensor Q 2 wks 12/19/20   Ladell Pier, MD  dorzolamide (TRUSOPT) 2 % ophthalmic solution Place 1 drop into the right eye 2 (two) times daily.    [provider]  glucose blood (ACCU-CHEK AVIVA PLUS) test strip Use as instructed for 3 times daily testing of blood sugar. E11.9 10/20/17   Ladell Pier, MD  glucose monitoring kit (FREESTYLE) monitoring kit 1 each by Does not apply route as needed for other. 06/24/18   Ladell Pier, MD  HUMALOG KWIKPEN 100 UNIT/ML KwikPen INJECT 10 UNITS INTO THE SKIN 3 (THREE) TIMES DAILY WITH MEALS. 07/31/21   Ladell Pier, MD  hydrALAZINE (APRESOLINE) 50 MG tablet Take 1.5 tablets (75 mg total) by mouth 3 (three) times daily. 06/17/21   Ladell Pier, MD  hydrocortisone (ANUSOL-HC) 2.5 % rectal cream Place 1 application rectally 2 (two) times daily as needed for hemorrhoids or anal itching (for flare of hemorrhoids). 11/16/20   Ladell Pier, MD  insulin glargine (LANTUS SOLOSTAR) 100 UNIT/ML Solostar Pen Inject 38 Units into the skin at bedtime. 11/16/20   Ladell Pier, MD  Lancets MISC Use as directed.  Accu chek 2 12/21/17   Ladell Pier, MD  latanoprost (XALATAN) 0.005 % ophthalmic solution Place 1 drop into the right eye at bedtime. 12/13/20   [provider]  Multiple Vitamins-Minerals (MULTIVITAMIN WITH MINERALS) tablet Take 1 tablet by mouth daily.    [provider]  RHOPRESSA 0.02 % SOLN Place 1 drop into the right eye at bedtime. 01/24/20   [provider]  timolol (BETIMOL) 0.5 % ophthalmic solution Place 1 drop into the right eye 2 (two) times daily.    [provider]  torsemide (DEMADEX) 20 MG tablet TAKE 2 TABLETS (40 MG TOTAL) BY MOUTH EVERY DAY 06/17/21   Ladell Pier, MD      Allergies    Coreg [carvedilol]    Review of Systems   Review of Systems  Constitutional:  Negative for chills and fever.  HENT:   Positive for congestion. Negative for rhinorrhea and sore throat.   Eyes:  Negative for visual disturbance.  Respiratory:  Positive for cough and shortness of breath.   Cardiovascular:  Positive for chest pain and leg swelling. Negative for palpitations.  Gastrointestinal:  Positive for abdominal pain and nausea. Negative for abdominal distention, anal bleeding, blood in stool, constipation, diarrhea and vomiting.  Genitourinary:  Negative for difficulty urinating, dysuria, flank pain, frequency, hematuria, penile discharge, penile pain, penile swelling and scrotal swelling.  Musculoskeletal:  Negative for back pain and neck pain.  Skin:  Negative for color change and rash.  Neurological:  Negative for dizziness, syncope, light-headedness and headaches.  Psychiatric/Behavioral:  Negative for confusion.    Physical Exam Updated Vital Signs BP (!) 155/86 (BP Location: Right Arm)    Pulse 93    Temp 98.6 F (37 C) (Oral)    Resp (!) 21    Ht 5' 10"  (  1.778 m)    Wt 112.9 kg    SpO2 94%    BMI 35.73 kg/m   Physical Exam Vitals and nursing note reviewed. Exam conducted with a chaperone present (Male RN present as chaperone).  Constitutional:      General: He is not in acute distress.    Appearance: He is not ill-appearing, toxic-appearing or diaphoretic.  HENT:     Head: Normocephalic.  Eyes:     General: No scleral icterus.       Right eye: No discharge.        Left eye: No discharge.  Cardiovascular:     Rate and Rhythm: Normal rate.     Pulses:          Radial pulses are 2+ on the right side and 2+ on the left side.  Pulmonary:     Effort: No tachypnea, bradypnea or respiratory distress.     Breath sounds: No stridor. Examination of the right-upper field reveals rhonchi. Examination of the left-upper field reveals rhonchi. Examination of the right-middle field reveals rhonchi. Examination of the left-middle field reveals rhonchi. Examination of the right-lower field reveals rhonchi.  Examination of the left-lower field reveals rhonchi. Rhonchi present.     Comments: Speaks in full complete sentences without difficulty.  Coarse lung sounds throughout.  Patient noticed to be coughing frequently throughout examination, cough is nonproductive at this time. Abdominal:     General: Abdomen is protuberant. Bowel sounds are normal. There is no distension. There are no signs of injury.     Palpations: Abdomen is soft. There is no mass or pulsatile mass.     Tenderness: There is abdominal tenderness in the epigastric area. There is no right CVA tenderness, left CVA tenderness, guarding or rebound.     Hernia: There is no hernia in the umbilical area or ventral area.  Genitourinary:    Rectum: Guaiac result negative. No mass, tenderness, anal fissure, external hemorrhoid or internal hemorrhoid. Normal anal tone.     Comments: No frank red blood or melena observed on rectal exam Musculoskeletal:     Right lower leg: 3+ Edema present.     Left lower leg: 3+ Edema present.     Comments: Chronic venous stasis changes to bilateral lower extremities.  No tenderness to palpation.  Skin:    General: Skin is warm and dry.  Neurological:     General: No focal deficit present.     Mental Status: He is alert.  Psychiatric:        Behavior: Behavior is cooperative.    ED Results / Procedures / Treatments   Labs (all labs ordered are listed, but only abnormal results are displayed) Labs Reviewed  COMPREHENSIVE METABOLIC PANEL - Abnormal; Notable for the following components:      Result Value   Glucose, Bld 165 (*)    BUN 37 (*)    Creatinine, Ser 2.22 (*)    Calcium 8.1 (*)    Total Protein 6.4 (*)    Albumin 3.2 (*)    GFR, Estimated 32 (*)    All other components within normal limits  CBC WITH DIFFERENTIAL/PLATELET - Abnormal; Notable for the following components:   RBC 3.36 (*)    Hemoglobin 9.6 (*)    HCT 29.0 (*)    Platelets 130 (*)    Lymphs Abs 0.5 (*)    All other  components within normal limits  BRAIN NATRIURETIC PEPTIDE - Abnormal; Notable for the following components:  B Natriuretic Peptide 620.6 (*)    All other components within normal limits  TROPONIN I (HIGH SENSITIVITY) - Abnormal; Notable for the following components:   Troponin I (High Sensitivity) 141 (*)    All other components within normal limits  RESP PANEL BY RT-PCR (FLU A&B, COVID) ARPGX2  LIPASE, BLOOD  URINALYSIS, ROUTINE W REFLEX MICROSCOPIC  POC OCCULT BLOOD, ED  TROPONIN I (HIGH SENSITIVITY)    EKG EKG Interpretation  Date/Time:  Saturday September 07 2021 10:24:45 EST Ventricular Rate:  96 PR Interval:  193 QRS Duration: 161 QT Interval:  396 QTC Calculation: 501 R Axis:   192 Text Interpretation: Sinus rhythm RBBB and LPFB Confirmed by Fredia Sorrow [phone]) on 09/07/2021 10:32:44 AM  Radiology CT Chest Wo Contrast  Result Date: 09/07/2021 CLINICAL DATA:  Evaluate pneumonia. EXAM: CT CHEST WITHOUT CONTRAST TECHNIQUE: Multidetector CT imaging of the chest was performed following the standard protocol without IV contrast. RADIATION DOSE REDUCTION: This exam was performed according to the departmental dose-optimization program which includes automated exposure control, adjustment of the mA and/or kV according to patient size and/or use of iterative reconstruction technique. COMPARISON:  Chest radiograph from earlier today FINDINGS: Cardiovascular: Mild cardiac enlargement. Aortic atherosclerosis. 3 vessel coronary artery calcifications. No pericardial effusion. Mediastinum/Nodes: Normal appearance of the thyroid gland. The trachea appears patent and is midline. Normal appearance of the esophagus. No enlarged axillary or supraclavicular lymph nodes. Prominent mediastinal lymph nodes are identified. Index right paratracheal lymph node measures 1.5 cm, image 55/3. Left pre-vascular lymph node measures 1.2 cm, image 53/5. The hilar lymph nodes are suboptimally evaluated due to  lack of IV contrast material. Lungs/Pleura: Trace bilateral pleural effusions. There is interlobular septal thickening within both lungs compatible with interstitial edema. Multifocal patchy areas of ground-glass and airspace consolidation are noted within both lungs. This is most severe within the posterior right upper lobe and posteromedial right lower lobe. Upper Abdomen: No acute abnormality within the imaged portions of the upper abdomen. Aortic atherosclerosis. Status post cholecystectomy. Musculoskeletal: No chest wall mass or suspicious bone lesions identified. IMPRESSION: 1. Multifocal patchy areas of ground-glass and airspace consolidation are noted within both lungs, most severe within the posterior right upper lobe and posteromedial right lower lobe. Findings are favored to represent multifocal pneumonia. Follow-up imaging is recommended to ensure resolution. 2. Cardiac enlargement, interstitial edema and trace bilateral pleural effusions compatible with CHF. 3. 3 vessel coronary artery calcifications. 4. Mildly enlarged mediastinal lymph nodes, nonspecific in the setting of multifocal. Infection and CHF. 5. Aortic Atherosclerosis (ICD10-I70.0). Electronically Signed   By: Kerby Moors M.D.   On: 09/07/2021 13:03   DG Chest Portable 1 View  Result Date: 09/07/2021 CLINICAL DATA:  Productive cough.  Chest pain and abdominal pain. EXAM: PORTABLE CHEST 1 VIEW COMPARISON:  12/24/2020 FINDINGS: Stable cardiomediastinal contours. Similar to study from 06/29/2020 there are recurrent, diffuse reticular and nodular opacities identified throughout both lungs. No focal lobar consolidation identified. No signs of pleural effusion or interstitial edema. IMPRESSION: Diffuse bilateral reticulonodular pulmonary opacities are noted bilaterally. Imaging findings are worrisome for multifocal pneumonia including atypical viral pneumonia. Follow-up imaging is advised to ensure resolution. If this does not resolve then  further evaluation with CT of the chest is recommended to assess for underlying malignancy. Electronically Signed   By: Kerby Moors M.D.   On: 09/07/2021 10:56    Procedures .Critical Care Performed by: Loni Beckwith, PA-C Authorized by: Loni Beckwith, PA-C   Critical care provider statement:  Critical care time (minutes):  30   Critical care was necessary to treat or prevent imminent or life-threatening deterioration of the following conditions:  Respiratory failure   Critical care was time spent personally by me on the following activities:  Development of treatment plan with patient or surrogate, evaluation of patient's response to treatment, examination of patient, ordering and review of laboratory studies, ordering and review of radiographic studies, ordering and performing treatments and interventions, pulse oximetry, re-evaluation of patient's condition, review of old charts and obtaining history from patient or surrogate   Care discussed with: admitting provider      Medications Ordered in ED Medications  azithromycin (ZITHROMAX) 500 mg in sodium chloride 0.9 % 250 mL IVPB (500 mg Intravenous New Bag/Given 09/07/21 1423)  ondansetron (ZOFRAN) injection 4 mg (4 mg Intravenous Given 09/07/21 1048)  morphine 4 MG/ML injection 4 mg (4 mg Intravenous Given 09/07/21 1048)  cefTRIAXone (ROCEPHIN) 1 g in sodium chloride 0.9 % 100 mL IVPB (0 g Intravenous Stopped 09/07/21 1403)  ondansetron (ZOFRAN) injection 4 mg (4 mg Intravenous Given 09/07/21 1421)  aspirin chewable tablet 324 mg (324 mg Oral Given 09/07/21 1455)  chlorpheniramine-HYDROcodone 10-8 MG/5ML suspension 5 mL (5 mLs Oral Given 09/07/21 1456)    ED Course/ Medical Decision Making/ A&P Clinical Course as of 09/07/21 1501  Sat Sep 07, 2021  1500 Spoke to Dr. Linda Hedges who will see the patient for admission [PB]    Clinical Course User Index [PB] Loni Beckwith, PA-C                           Medical  Decision Making Amount and/or Complexity of Data Reviewed Labs: ordered. Radiology: ordered.  Risk OTC drugs. Prescription drug management. Decision regarding hospitalization.   This patient presents to the ED for concern of flulike illness, chest pain, abdominal pain, leg swelling and tenderness, this involves an extensive number of treatment options, and is a complaint that carries with it a high risk of complications and morbidity.  The differential diagnosis includes but is not limited to ACS, acute CHF exacerbation, PE, COVID-19, on influenza, pneumonia, pancreatitis, gastroenteritis.   Co morbidities that complicate the patient evaluation  CAD, diabetes mellitus, hypertension, CKD, CHF   Additional history obtained:  External records from outside source obtained and reviewed including previous provider notes, lab results, and imaging   Lab Tests:  I Ordered, and personally interpreted labs.  The pertinent results include:   CBC shows anemia with hemoglobin at 9.6.  Patient noted to have anemia in the past, most recently hemoglobin 11.1  two months prior. CMP shows acute on chronic AKI with BUN/creatinine elevated at 37 and 2.22 respectively. Troponin elevated at 141, suspect this is likely due to demand ischemia BNP slightly above patient's baseline AST, ALT, alk phos, total bili, lipase all within normal limits. Respiratory panel negative for COVID-19 and influenza.   Imaging Studies ordered:  I ordered imaging studies including chest x-ray I independently visualized and interpreted imaging which showed diffuse bilateral reticulonodular pulmonary opacities bilaterally. I agree with the radiologist interpretation Additionally CT chest shows multifocal patchy areas of bronchus and airspace consolidation in both lungs favored to represent multifocal pneumonia.   Cardiac Monitoring:  The patient was maintained on a cardiac monitor.  I personally viewed and interpreted  the cardiac monitored which showed an underlying rhythm of: Sinus rhythm with right bundle branch block   Medicines ordered and prescription drug management:  I ordered medication including Zofran for nausea.  Morphine for pain management. Reevaluation of the patient after these medicines showed that the patient improved I have reviewed the patients home medicines and have made adjustments as needed   Test Considered:  CT abdomen pelvis was considered however on serial repeat examination abdomen soft, nondistended, tenderness resolved.  AST, ALT, alk phos, total bili, and lipase all within normal limits.   Critical Interventions:  Administering oxygen, serial examination for acute respiratory failure with hypoxia   Problem List / ED Course:  Cough, shortness of breath Cough and shortness of breath x2 days.  Patient has been around multiple people sick with similar symptoms. Negative for COVID-19 and influenza Chest x-ray concerning for multifocal pneumonia versus viral pneumonia.  CT chest was ordered and consistent with multifocal pneumonia.  Patient started on ceftriaxone and azithromycin. Patient has low oxygen saturation on room air.  Oxygen saturation dropped to 70% with ambulation on room air.  Patient placed on 2 L of oxygen.  Will need to be admitted for acute respiratory failure with hypoxia secondary to multifocal pneumonia. Patient's troponin was found to be elevated at 141.  Suspect that this is due to demand ischemia related to his hypoxia.  Will obtain delta troponin Spoke to hospitalist Dr. Linda Hedges who will see the patient for admission Abdominal pain Abdomen soft, nondistended, tenderness to epigastric area. Lipase within normal limits; low suspicion for acute pancreatitis at this time AST, ALT, alk phos, total bili all within normal limits; low suspicion for hepatobiliary disease at this time. On serial repeat examination abdomen soft, nondistended, tenderness  resolved. Will hold any imaging at this time  Patient care was discussed with attending physician Dr. Rogene Houston   Reevaluation:  After the interventions noted above, I reevaluated the patient and found that they have :improved   Disposition:  After consideration of the diagnostic results and the patients response to treatment, I feel that the patent would benefit from admission.           Final Clinical Impression(s) / ED Diagnoses Final diagnoses:  Acute respiratory failure with hypoxia [facility])  Multifocal pneumonia    Rx / DC Orders ED Discharge Orders     None         Loni Beckwith, PA-C 09/07/21 1733    Fredia Sorrow, MD 09/08/21 [phone]

## 2021-09-07 NOTE — ED Notes (Signed)
NRM 10LPM applied to pt for SPO2<91%

## 2021-09-08 ENCOUNTER — Inpatient Hospital Stay: Payer: Self-pay

## 2021-09-08 ENCOUNTER — Inpatient Hospital Stay (HOSPITAL_COMMUNITY): Payer: 59

## 2021-09-08 DIAGNOSIS — I214 Non-ST elevation (NSTEMI) myocardial infarction: Secondary | ICD-10-CM

## 2021-09-08 DIAGNOSIS — J810 Acute pulmonary edema: Secondary | ICD-10-CM | POA: Insufficient documentation

## 2021-09-08 DIAGNOSIS — N1831 Chronic kidney disease, stage 3a: Secondary | ICD-10-CM

## 2021-09-08 DIAGNOSIS — I5033 Acute on chronic diastolic (congestive) heart failure: Secondary | ICD-10-CM | POA: Diagnosis not present

## 2021-09-08 DIAGNOSIS — J189 Pneumonia, unspecified organism: Secondary | ICD-10-CM | POA: Diagnosis not present

## 2021-09-08 LAB — BASIC METABOLIC PANEL
Anion gap: 10 (ref 5–15)
Anion gap: 8 (ref 5–15)
BUN: 43 mg/dL — ABNORMAL HIGH (ref 8–23)
BUN: 51 mg/dL — ABNORMAL HIGH (ref 8–23)
CO2: 27 mmol/L (ref 22–32)
CO2: 24 mmol/L (ref 22–32)
Calcium: 7.9 mg/dL — ABNORMAL LOW (ref 8.9–10.3)
Calcium: 7.4 mg/dL — ABNORMAL LOW (ref 8.9–10.3)
Chloride: 100 mmol/L (ref 98–111)
Chloride: 102 mmol/L (ref 98–111)
Creatinine, Ser: 2.52 mg/dL — ABNORMAL HIGH (ref 0.61–1.24)
Creatinine, Ser: 2.75 mg/dL — ABNORMAL HIGH (ref 0.61–1.24)
GFR, Estimated: 28 mL/min — ABNORMAL LOW (ref >60)
GFR, Estimated: 25 mL/min — ABNORMAL LOW (ref >60)
Glucose, Bld: 132 mg/dL — ABNORMAL HIGH (ref 70–99)
Glucose, Bld: 137 mg/dL — ABNORMAL HIGH (ref 70–99)
Potassium: 4 mmol/L (ref 3.5–5.1)
Potassium: 4 mmol/L (ref 3.5–5.1)
Sodium: 137 mmol/L (ref 135–145)
Sodium: 134 mmol/L — ABNORMAL LOW (ref 135–145)

## 2021-09-08 LAB — TROPONIN I (HIGH SENSITIVITY)
Troponin I (High Sensitivity): 2512 ng/L (ref <18)
Troponin I (High Sensitivity): 4145 ng/L (ref <18)

## 2021-09-08 LAB — COOXEMETRY PANEL
Carboxyhemoglobin: 0.7 % (ref 0.5–1.5)
Methemoglobin: 0.7 % (ref 0.0–1.5)
O2 Saturation: 48.4 %
Total hemoglobin: 13.9 g/dL (ref 12.0–16.0)

## 2021-09-08 LAB — I-STAT ARTERIAL BLOOD GAS, ED
Acid-Base Excess: 2 mmol/L (ref 0.0–2.0)
Acid-Base Excess: 2 mmol/L (ref 0.0–2.0)
Bicarbonate: 27.6 mmol/L (ref 20.0–28.0)
Bicarbonate: 27.9 mmol/L (ref 20.0–28.0)
Calcium, Ion: 1.07 mmol/L — ABNORMAL LOW (ref 1.15–1.40)
Calcium, Ion: 1.08 mmol/L — ABNORMAL LOW (ref 1.15–1.40)
HCT: 28 % — ABNORMAL LOW (ref 39.0–52.0)
HCT: 29 % — ABNORMAL LOW (ref 39.0–52.0)
Hemoglobin: 9.5 g/dL — ABNORMAL LOW (ref 13.0–17.0)
Hemoglobin: 9.9 g/dL — ABNORMAL LOW (ref 13.0–17.0)
O2 Saturation: 99 %
O2 Saturation: 61 %
Patient temperature: 98.5
Patient temperature: 98.8
Potassium: 4.1 mmol/L (ref 3.5–5.1)
Potassium: 3.9 mmol/L (ref 3.5–5.1)
Sodium: 139 mmol/L (ref 135–145)
Sodium: 138 mmol/L (ref 135–145)
TCO2: 29 mmol/L (ref 22–32)
TCO2: 29 mmol/L (ref 22–32)
pCO2 arterial: 44.5 mmHg (ref 32.0–48.0)
pCO2 arterial: 50.1 mmHg — ABNORMAL HIGH (ref 32.0–48.0)
pH, Arterial: 7.4 (ref 7.350–7.450)
pH, Arterial: 7.355 (ref 7.350–7.450)
pO2, Arterial: 116 mmHg — ABNORMAL HIGH (ref 83.0–108.0)
pO2, Arterial: 34 mmHg — CL (ref 83.0–108.0)

## 2021-09-08 LAB — CBC
HCT: 27.1 % — ABNORMAL LOW (ref 39.0–52.0)
Hemoglobin: 8.9 g/dL — ABNORMAL LOW (ref 13.0–17.0)
MCH: 29 pg (ref 26.0–34.0)
MCHC: 32.8 g/dL (ref 30.0–36.0)
MCV: 88.3 fL (ref 80.0–100.0)
Platelets: 156 10*3/uL (ref 150–400)
RBC: 3.07 MIL/uL — ABNORMAL LOW (ref 4.22–5.81)
RDW: 14.5 % (ref 11.5–15.5)
WBC: 9.8 10*3/uL (ref 4.0–10.5)
nRBC: 0 % (ref 0.0–0.2)

## 2021-09-08 LAB — POCT I-STAT 7, (LYTES, BLD GAS, ICA,H+H)
Acid-Base Excess: 3 mmol/L — ABNORMAL HIGH (ref 0.0–2.0)
Bicarbonate: 27.9 mmol/L (ref 20.0–28.0)
Calcium, Ion: 1.08 mmol/L — ABNORMAL LOW (ref 1.15–1.40)
HCT: 28 % — ABNORMAL LOW (ref 39.0–52.0)
Hemoglobin: 9.5 g/dL — ABNORMAL LOW (ref 13.0–17.0)
O2 Saturation: 87 %
Potassium: 4 mmol/L (ref 3.5–5.1)
Sodium: 138 mmol/L (ref 135–145)
TCO2: 29 mmol/L (ref 22–32)
pCO2 arterial: 45.6 mmHg (ref 32.0–48.0)
pH, Arterial: 7.395 (ref 7.350–7.450)
pO2, Arterial: 54 mmHg — ABNORMAL LOW (ref 83.0–108.0)

## 2021-09-08 LAB — ECHOCARDIOGRAM COMPLETE
AR max vel: 2.16 cm2
AV Area VTI: 2.07 cm2
AV Area mean vel: 2 cm2
AV Mean grad: 3 mmHg
AV Peak grad: 4.7 mmHg
Ao pk vel: 1.08 m/s
Area-P 1/2: 4.33 cm2
Height: 70 in
MV VTI: 1.52 cm2
S' Lateral: 4.4 cm
Weight: 3984 oz

## 2021-09-08 LAB — EXPECTORATED SPUTUM ASSESSMENT W GRAM STAIN, RFLX TO RESP C

## 2021-09-08 LAB — BRAIN NATRIURETIC PEPTIDE
B Natriuretic Peptide: 1022.5 pg/mL — ABNORMAL HIGH (ref 0.0–100.0)
B Natriuretic Peptide: 1081.8 pg/mL — ABNORMAL HIGH (ref 0.0–100.0)

## 2021-09-08 LAB — LIPID PANEL
Cholesterol: 95 mg/dL (ref 0–200)
HDL: 33 mg/dL — ABNORMAL LOW (ref >40)
LDL Cholesterol: 54 mg/dL (ref 0–99)
Total CHOL/HDL Ratio: 2.9 RATIO
Triglycerides: 38 mg/dL (ref <150)
VLDL: 8 mg/dL (ref 0–40)

## 2021-09-08 LAB — STREP PNEUMONIAE URINARY ANTIGEN: Strep Pneumo Urinary Antigen: NEGATIVE

## 2021-09-08 LAB — GLUCOSE, CAPILLARY
Glucose-Capillary: 141 mg/dL — ABNORMAL HIGH (ref 70–99)
Glucose-Capillary: 89 mg/dL (ref 70–99)

## 2021-09-08 LAB — HIV ANTIBODY (ROUTINE TESTING W REFLEX): HIV Screen 4th Generation wRfx: NONREACTIVE

## 2021-09-08 LAB — PROCALCITONIN: Procalcitonin: 0.3 ng/mL

## 2021-09-08 LAB — CBG MONITORING, ED
Glucose-Capillary: 100 mg/dL — ABNORMAL HIGH (ref 70–99)
Glucose-Capillary: 124 mg/dL — ABNORMAL HIGH (ref 70–99)

## 2021-09-08 LAB — HEPARIN LEVEL (UNFRACTIONATED): Heparin Unfractionated: 0.44 IU/mL (ref 0.30–0.70)

## 2021-09-08 MED ORDER — FUROSEMIDE 10 MG/ML IJ SOLN
80.0000 mg | Freq: Two times a day (BID) | INTRAMUSCULAR | Status: DC
  Administered 2021-09-08: 80 mg via INTRAVENOUS
  Filled 2021-09-08: qty 8

## 2021-09-08 MED ORDER — ONDANSETRON HCL 4 MG/2ML IJ SOLN
4.0000 mg | Freq: Four times a day (QID) | INTRAMUSCULAR | Status: DC | PRN
  Administered 2021-09-08 – 2021-09-13 (×3): 4 mg via INTRAVENOUS
  Filled 2021-09-08 (×3): qty 2

## 2021-09-08 MED ORDER — CARVEDILOL 3.125 MG PO TABS: 6.2500 mg | ORAL_TABLET | Freq: Two times a day (BID) | ORAL | Status: DC

## 2021-09-08 MED ORDER — METOLAZONE 5 MG PO TABS
10.0000 mg | ORAL_TABLET | Freq: Once | ORAL | Status: AC
  Administered 2021-09-08: 10 mg via ORAL
  Filled 2021-09-08: qty 2

## 2021-09-08 MED ORDER — SODIUM CHLORIDE 0.9 % IV SOLN: INTRAVENOUS | Status: DC | PRN

## 2021-09-08 MED ORDER — PERFLUTREN LIPID MICROSPHERE
1.0000 mL | INTRAVENOUS | Status: AC | PRN
  Administered 2021-09-08: 3 mL via INTRAVENOUS
  Filled 2021-09-08: qty 10

## 2021-09-08 MED ORDER — SCOPOLAMINE 1 MG/3DAYS TD PT72
1.0000 | MEDICATED_PATCH | TRANSDERMAL | Status: DC
  Filled 2021-09-08: qty 1

## 2021-09-08 MED ORDER — DOCUSATE SODIUM 100 MG PO CAPS: 100.0000 mg | ORAL_CAPSULE | Freq: Two times a day (BID) | ORAL | Status: DC | PRN

## 2021-09-08 MED ORDER — FUROSEMIDE 10 MG/ML IJ SOLN
15.0000 mg/h | INTRAVENOUS | Status: DC
  Administered 2021-09-08 – 2021-09-09 (×2): 10 mg/h via INTRAVENOUS
  Administered 2021-09-10 – 2021-09-15 (×7): 15 mg/h via INTRAVENOUS
  Filled 2021-09-08 (×14): qty 20

## 2021-09-08 MED ORDER — CHLORHEXIDINE GLUCONATE CLOTH 2 % EX PADS
6.0000 | MEDICATED_PAD | Freq: Every day | CUTANEOUS | Status: DC
  Administered 2021-09-08 – 2021-09-14 (×7): 6 via TOPICAL

## 2021-09-08 MED ORDER — SODIUM CHLORIDE 0.9% FLUSH
10.0000 mL | Freq: Two times a day (BID) | INTRAVENOUS | Status: DC
  Administered 2021-09-08 – 2021-09-12 (×8): 10 mL
  Administered 2021-09-13: 20 mL
  Administered 2021-09-13: 23 mL
  Administered 2021-09-14 – 2021-09-15 (×4): 10 mL

## 2021-09-08 MED ORDER — HEPARIN (PORCINE) 25000 UT/250ML-% IV SOLN
1300.0000 [IU]/h | INTRAVENOUS | Status: DC
  Administered 2021-09-08: 1300 [IU]/h via INTRAVENOUS
  Filled 2021-09-08 (×2): qty 250

## 2021-09-08 MED ORDER — SODIUM CHLORIDE 0.9 % IV SOLN
12.5000 mg | Freq: Once | INTRAVENOUS | Status: AC | PRN
  Administered 2021-09-08: 12.5 mg via INTRAVENOUS
  Filled 2021-09-08 (×2): qty 0.5

## 2021-09-08 MED ORDER — SODIUM CHLORIDE 0.9% FLUSH: 10.0000 mL | INTRAVENOUS | Status: DC | PRN

## 2021-09-08 MED ORDER — FUROSEMIDE 10 MG/ML IJ SOLN
40.0000 mg | Freq: Once | INTRAMUSCULAR | Status: AC
  Administered 2021-09-08: 40 mg via INTRAVENOUS
  Filled 2021-09-08: qty 4

## 2021-09-08 MED ORDER — METOPROLOL TARTRATE 25 MG PO TABS
25.0000 mg | ORAL_TABLET | Freq: Two times a day (BID) | ORAL | Status: DC
  Administered 2021-09-08 – 2021-09-09 (×2): 25 mg via ORAL
  Filled 2021-09-08 (×2): qty 1

## 2021-09-08 MED ORDER — HEPARIN BOLUS VIA INFUSION
4000.0000 [IU] | Freq: Once | INTRAVENOUS | Status: AC
  Administered 2021-09-08: 4000 [IU] via INTRAVENOUS
  Filled 2021-09-08: qty 4000

## 2021-09-08 MED ORDER — POLYETHYLENE GLYCOL 3350 17 G PO PACK: 17.0000 g | PACK | Freq: Every day | ORAL | Status: DC | PRN

## 2021-09-08 NOTE — ED Notes (Signed)
Cardiology at bedside.

## 2021-09-08 NOTE — Progress Notes (Signed)
RT note: Trial patient off BIPAP per his request. Placed on HFNC titrated up to 15L. Patients sats continued to as low as 82%. Placed patient back on BIPAP. Sats increased to 97%. Vital signs stable at this time.

## 2021-09-08 NOTE — ED Notes (Signed)
Marlowe Sax, MD has been paged regarding pt's status. No orders at this time.

## 2021-09-08 NOTE — Progress Notes (Signed)
Patient transported to 0O49 without complication.

## 2021-09-08 NOTE — ED Notes (Signed)
RT at bedside to place pt on BiPap

## 2021-09-08 NOTE — Progress Notes (Signed)
eLink Physician-Brief Progress Note Patient Name: Derek Lowery DOB: 28-Jul-1956 MRN: [id number]   Date of Service  09/08/2021  HPI/Events of Note  65-y/o M, HFpEF, Echo with EF 30-35%, p/w cough, chest pain.  Found to have multifocal pneumonia with non-ST elevation MI.  Started on Rocephin, azithromycin, heparin drip.  PCCM consulted on 1/22 for worsening dyspnea, hypoxia. Has been on Lasix drip.   Called by RN regarding quick desaturation off biPAP. He is on biPAP 18/12, fio2 100%, drops to 78-80% even with a few minutes off it.   eICU Interventions  - currently, with BiPAP on, appears relatively comfortable  - has triple lumen PICC access if needed  - continue current care- low threshold for intubation will care ground team if indicated- he is stable for now.  -may need to escalate antibiotics- f/u procal  -continue with diuresis- f/u BNP -wants breaks from biPAP- he is currently able to be verbally re-directable- if this becomes an issue can place on low dose Precedex with BiPAP for tolerance- will follow.         Sanjit Mcmichael N Curtisha Bendix 09/08/2021, 10:06 PM

## 2021-09-08 NOTE — Progress Notes (Addendum)
Overnight progress note  Patient admitted for multifocal pneumonia and decompensated CHF.  Informed by RN that patient is complaining of shortness of breath.  He was on 6 L supplemental oxygen desatted to the mid 80s.  Now on 10 L salter and satting 92%.  Respiratory rate in the mid 20s.  Heart rate in the 90s.  Blood pressure 154/81.  Temperature 98.5 F.  Troponin trending up 141 > 333 > 714.  Patient is not complaining of chest pain.  EKG showing no significant change compared to tracing done at the time of admission yesterday.  -Patient already received full dose aspirin -He is currently on Lovenox for DVT prophylaxis.  Will discontinue and start on IV heparin per ACS protocol.  Echocardiogram ordered.  Patient with known CAD with prior NSTEMI and stenting.  Consulted cardiology (Dr. Gaynelle Arabian). -Continue antibiotics for multifocal pneumonia -IV Lasix 40 mg x 1 ordered, continue to monitor renal function as creatinine is up from baseline. -Trend troponin -Stat repeat chest x-ray  Addendum/update 4:15 AM:  Notified by RN that patient desatted to mid 28s on 10 L salter.  No urine output yet.  Patient seen and examined at bedside.  Sitting up at the side of the bed.  Endorsing dyspnea and orthopnea but reports improvement after being placed on nonrebreather.  Denies chest pain.  Heart rate in the 90s.  Blood pressure stable.  SPO2 99-100% on 15 L via nonrebreather.  Respiratory rate in the mid 20s.  Speaking clearly in full sentences.  Appears volume overloaded on exam with +4 pitting edema of bilateral lower extremities and rales up to mid lung fields bilaterally.  Not wheezing on exam.  Stat ABG showing pH 7.40, PCO2 44, PO2 116.  BNP 620.  Creatinine 2.2>2.5 (baseline around 1.5). Troponin trending up, now 2512 but patient continues to deny chest pain.  Chest x-ray done earlier showing worsening bilateral opacities. ?Multifocal pneumonia, no fever or leukocytosis.  Procalcitonin 0.30.   COVID and flu negative.  -Continue supplemental oxygen via nonrebreather at this time.  May need BiPAP if his respiratory status is worsens. -Requested RN to do bladder scan -Discussed with Dr. Nichola Sizer, he recommends more aggressive IV diuresis.  I have ordered additional IV Lasix 40 mg x 1.  Repeat labs in the morning to check renal function and continue IV diuresis. -Continue aspirin, heparin drip, metoprolol, Lipitor  Addendum: I have discussed CODE STATUS with the patient and he wishes to be FULL CODE.

## 2021-09-08 NOTE — Progress Notes (Addendum)
Patient is on a NRB and is tachypneic. Per RN his O2 saturation dropped to the 60's when she tried to switch him to a HFNC. BBS are crackles all throughout lung fields. RT has suggested patient needs bipap at this time to attending MD.

## 2021-09-08 NOTE — Consult Note (Signed)
NAME:  Derek Lowery, MRN:  [id number], DOB:  02/25/1956, LOS: 1 ADMISSION DATE:  09/07/2021, CONSULTATION DATE: 09/08/2021 REFERRING MD: Reggy Eye MD , CHIEF COMPLAINT: Pneumonia, non-ST relation MI  History of Present Illness:   66 year old with history of coronary artery disease, HFpEF, hypertension, diabetes, hyperlipidemia, glaucoma with legal blindness presenting with cough, chest pain.  Found to have multifocal pneumonia with non-ST elevation MI.  Started on Rocephin, azithromycin, heparin drip.  PCCM consulted on 1/22 for worsening dyspnea, hypoxia  Pertinent  Medical History    has a past medical history of Arthritis, CAD (coronary artery disease), Cataract, Essential hypertension, benign, Glaucoma, Hypercholesteremia, Internal hemorrhoids, Noncompliance, and Type 2 diabetes mellitus ([place]) (1990).   Significant Hospital Events: Including procedures, antibiotic start and stop dates in addition to other pertinent events   1/21- Admit  Interim History / Subjective:    Objective   Blood pressure (!) 153/90, pulse 89, temperature 98.5 F (36.9 C), temperature source Oral, resp. rate (!) 34, height 5' 10"  (1.778 m), weight 112.9 kg, SpO2 93 %.    FiO2 (%):  [80 %-90 %] 90 %   Intake/Output Summary (Last 24 hours) at 09/08/2021 1047 Last data filed at 09/08/2021 1036 Gross per 24 hour  Intake 247.46 ml  Output --  Net 247.46 ml   Filed Weights   09/07/21 1034  Weight: 112.9 kg    Examination: Blood pressure (!) 153/90, pulse 89, temperature 98.5 F (36.9 C), temperature source Oral, resp. rate (!) 34, height 5' 10"  (1.778 m), weight 112.9 kg, SpO2 93 %. Gen:      No acute distress, moderate distress.  Clinically looks HEENT:  EOMI, sclera anicteric Neck:     No masses; no thyromegaly Lungs:    Bilateral crackles CV:         Regular rate and rhythm; no murmurs Abd:      + bowel sounds; soft, non-tender; no palpable masses, no distension Ext:    2+ edema with chronic  stasis dermatitis changes Skin:      Warm and dry; no rash Neuro: Awake, responsive  Labs/imaging reviewed Significant for BUN/creatinine 43/2.52 Troponin 4145, BNP 1022 Hemoglobin 8.9 CT chest with patchy areas of consolidation, groundglass opacity and pulmonary edema  Resolved Hospital Problem list     Assessment & Plan:  Acute hypoxic respiratory failure secondary to multilobar pneumonia and pulmonary edema Started on BiPAP Repeat Lasix dose for aggressive diuresis as he still appears volume overloaded Admit to ICU with low threshold for intubation Repeat ABG Continue antibiotics for community-acquired pneumonia coverage Check urine Legionella, pneumococcus Follow cultures  Non-ST elevation MI Acute diastolic heart failure Hypertension Heparin drip, diuresis Echocardiogram is pending Norvasc, hydralazine, Lopressor for blood pressure control Management per cardiology  Diabetes Semglee, NovoLog, SSI coverage  Best Practice (right click and "Reselect all SmartList Selections" daily)   Diet/type: NPO DVT prophylaxis: systemic heparin GI prophylaxis: PPI Lines: N/A Foley:  N/A Code Status:  full code Last date of multidisciplinary goals of care discussion []   Labs   CBC: Recent Labs  Lab 09/07/21 1030 09/08/21 0426 09/08/21 0556  WBC 6.6  --  9.8  NEUTROABS 5.2  --   --   HGB 9.6* 9.5* 8.9*  HCT 29.0* 28.0* 27.1*  MCV 86.3  --  88.3  PLT 130*  --  626    Basic Metabolic Panel: Recent Labs  Lab 09/07/21 1030 09/08/21 0217 09/08/21 0426  NA 139 137 139  K 3.5 4.0 4.1  CL 102 100  --   CO2 27 27  --   GLUCOSE 165* 132*  --   BUN 37* 43*  --   CREATININE 2.22* 2.52*  --   CALCIUM 8.1* 7.9*  --    GFR: Estimated Creatinine Clearance: 37.8 mL/min (A) (by C-G formula based on SCr of 2.52 mg/dL (H)). Recent Labs  Lab 09/07/21 1030 09/08/21 0217 09/08/21 0556  PROCALCITON  --  0.30  --   WBC 6.6  --  9.8    Liver Function Tests: Recent Labs   Lab 09/07/21 1030  AST 33  ALT 20  ALKPHOS 67  BILITOT 0.5  PROT 6.4*  ALBUMIN 3.2*   Recent Labs  Lab 09/07/21 1030  LIPASE 51   No results for input(s): AMMONIA in the last 168 hours.  ABG    Component Value Date/Time   PHART 7.400 09/08/2021 0426   PCO2ART 44.5 09/08/2021 0426   PO2ART 116 (H) 09/08/2021 0426   HCO3 27.6 09/08/2021 0426   TCO2 29 09/08/2021 0426   ACIDBASEDEF 0.3 10/05/2015 1419   O2SAT 99.0 09/08/2021 0426     Coagulation Profile: No results for input(s): INR, PROTIME in the last 168 hours.  Cardiac Enzymes: No results for input(s): CKTOTAL, CKMB, CKMBINDEX, TROPONINI in the last 168 hours.  HbA1C: HbA1c, POC (controlled diabetic range)  Date/Time Value Ref Range Status  06/17/2021 10:41 AM 7.1 (A) 0.0 - 7.0 % Final  11/16/2020 04:42 PM 8.8 (A) 0.0 - 7.0 % Final    CBG: Recent Labs  Lab 09/07/21 1708 09/07/21 2349 09/08/21 0052 09/08/21 0825  GLUCAP 113* 87 100* 124*    Review of Systems:   REVIEW OF SYSTEMS:   All negative; except for those that are bolded, which indicate positives.  Constitutional: weight loss, weight gain, night sweats, fevers, chills, fatigue, weakness.  HEENT: headaches, sore throat, sneezing, nasal congestion, post nasal drip, difficulty swallowing, tooth/dental problems, visual complaints, visual changes, ear aches. Neuro: difficulty with speech, weakness, numbness, ataxia. CV:  chest pain, orthopnea, PND, swelling in lower extremities, dizziness, palpitations, syncope.  Resp: cough, hemoptysis, dyspnea, wheezing. GI: heartburn, indigestion, abdominal pain, nausea, vomiting, diarrhea, constipation, change in bowel habits, loss of appetite, hematemesis, melena, hematochezia.  GU: dysuria, change in color of urine, urgency or frequency, flank pain, hematuria. MSK: joint pain or swelling, decreased range of motion. Psych: change in mood or affect, depression, anxiety, suicidal ideations, homicidal  ideations. Skin: rash, itching, bruising.   Past Medical History:  He,  has a past medical history of Arthritis, CAD (coronary artery disease), Cataract, Essential hypertension, benign, Glaucoma, Hypercholesteremia, Internal hemorrhoids, Noncompliance, and Type 2 diabetes mellitus ([place]) (1990).   Surgical History:   Past Surgical History:  Procedure Laterality Date   CATARACT EXTRACTION Right 2019   Dr. Katy Fitch   CHOLECYSTECTOMY N/A 09/21/2017   Procedure: LAPAROSCOPIC CHOLECYSTECTOMY WITH INTRAOPERATIVE CHOLANGIOGRAM;  Surgeon: Michael Boston, MD;  Location: [facility];  Service: General;  Laterality: N/A;   COLONOSCOPY  2000   Dr. Collene Mares   EYE SURGERY     IRRIGATION AND DEBRIDEMENT ABSCESS Left 05/05/2014   Procedure: IRRIGATION AND DEBRIDEMENT ABSCESS left buttock;  Surgeon: Michael Boston, MD;  Location: [facility];  Service: General;  Laterality: Left;   LEFT HEART CATHETERIZATION WITH CORONARY ANGIOGRAM N/A 07/18/2011   Procedure: LEFT HEART CATHETERIZATION WITH CORONARY ANGIOGRAM;  Surgeon: Hillary Bow, MD;  Location: [facility] C[facility] LAB;  Service: Cardiovascular;  Laterality: N/A;   PERCUTANEOUS CORONARY STENT INTERVENTION (PCI-S)  07/18/2011   Procedure: PERCUTANEOUS CORONARY STENT INTERVENTION (PCI-S);  Surgeon: Hillary Bow, MD;  Location: [facility] CATH LAB;  Service: Cardiovascular;;     Social History:   reports that he has never smoked. He has never used smokeless tobacco. He reports that he does not drink alcohol and does not use drugs.   Family History:  His family history includes Coronary artery disease in his father; Diabetes in his brother, father, and sister; Heart failure in his mother; Hypertension in his mother; Kidney disease in his father; Melanoma in his father; Rectal cancer in his father. There is no history of Colon cancer, Esophageal cancer, or Stomach cancer.   Allergies Allergies  Allergen Reactions   Coreg [Carvedilol] Other (See Comments)    Redness and swelling of calf      Home Medications  Prior to Admission medications   Medication Sig Start Date End Date Taking? Authorizing Provider  amLODipine (NORVASC) 10 MG tablet Take 1 tablet (10 mg total) by mouth daily. 06/17/21  Yes Ladell Pier, MD  ammonium lactate (AMLACTIN) 12 % lotion Apply to both feet twice daily for dry skin. Patient taking differently: 1 application as needed for dry skin. Apply to both feet twice daily for dry skin. 11/16/20  Yes Ladell Pier, MD  aspirin EC 81 MG tablet Take 1 tablet (81 mg total) by mouth daily. 07/12/19  Yes McClung, Angela M, PA-C  atorvastatin (LIPITOR) 40 MG tablet TAKE 1 TABLET BY MOUTH EVERY DAY Patient taking differently: Take 40 mg by mouth daily. TAKE 1 TABLET BY MOUTH EVERY DAY 03/17/21  Yes Ladell Pier, MD  brimonidine (ALPHAGAN) 0.2 % ophthalmic solution Place 1 drop into the right eye 2 (two) times daily.  07/23/18  Yes [provider]  dorzolamide (TRUSOPT) 2 % ophthalmic solution Place 1 drop into the right eye 2 (two) times daily.   Yes [provider]  HUMALOG KWIKPEN 100 UNIT/ML KwikPen INJECT 10 UNITS INTO THE SKIN 3 (THREE) TIMES DAILY WITH MEALS. Patient taking differently: Inject 10 Units into the skin 3 (three) times daily. 07/31/21  Yes Ladell Pier, MD  hydrALAZINE (APRESOLINE) 50 MG tablet Take 1.5 tablets (75 mg total) by mouth 3 (three) times daily. 06/17/21  Yes Ladell Pier, MD  insulin glargine (LANTUS SOLOSTAR) 100 UNIT/ML Solostar Pen Inject 38 Units into the skin at bedtime. 11/16/20  Yes Ladell Pier, MD  Multiple Vitamins-Minerals (MULTIVITAMIN WITH MINERALS) tablet Take 1 tablet by mouth daily.   Yes [provider]  RHOPRESSA 0.02 % SOLN Place 1 drop into the right eye at bedtime. 01/24/20  Yes [provider]  timolol (TIMOPTIC) 0.5 % ophthalmic solution Place 1 drop into the right eye 2 (two) times daily. 07/09/21  Yes [provider]  BD PEN NEEDLE NANO 2ND  GEN 32G X 4 MM MISC USE AS DIRECTED 3 TIMES A DAY. PLEASE SCHEDULE AN APPOINTMENT FOR ADDITIONAL REFILLS. 01/02/21   Ladell Pier, MD  blood glucose meter kit and supplies KIT Dispense based on patient and insurance preference. Use up to four times daily as directed. One Touch Verio 09/24/18   Ladell Pier, MD  Continuous Blood Gluc Receiver (DEXCOM G6 RECEIVER) DEVI 1 Device by Does not apply route daily. 06/17/21   Ladell Pier, MD  Continuous Blood Gluc Receiver (FREESTYLE LIBRE 14 DAY READER) DEVI USE AS DIRECTED 12/19/20   Ladell Pier, MD  Continuous Blood Gluc Sensor ([place]) MISC  1 packet by Does not apply route daily. 06/17/21   Ladell Pier, MD  Continuous Blood Gluc Sensor (FREESTYLE LIBRE SENSOR SYSTEM) MISC Change sensor Q 2 wks 12/19/20   Ladell Pier, MD  glucose blood (ACCU-CHEK AVIVA PLUS) test strip Use as instructed for 3 times daily testing of blood sugar. E11.9 10/20/17   Ladell Pier, MD  glucose monitoring kit (FREESTYLE) monitoring kit 1 each by Does not apply route as needed for other. 06/24/18   Ladell Pier, MD  Lancets MISC Use as directed.  Accu chek 2 12/21/17   Ladell Pier, MD  latanoprost (XALATAN) 0.005 % ophthalmic solution Place 1 drop into the right eye at bedtime. 12/13/20   [provider]  torsemide (DEMADEX) 20 MG tablet TAKE 2 TABLETS (40 MG TOTAL) BY MOUTH EVERY DAY Patient taking differently: Take 40 mg by mouth daily. 06/17/21   Ladell Pier, MD     Critical care time:    The patient is critically ill with multiple organ system failure and requires high complexity decision making for assessment and support, frequent evaluation and titration of therapies, advanced monitoring, review of radiographic studies and interpretation of complex data.   Critical Care Time devoted to patient care services, exclusive of separately billable procedures, described in this note is 35 minutes.   Marshell Garfinkel MD [facility] See Amion for pager  If no response to pager , please call [phone] until 7pm After 7:00 pm call Elink  [phone] 09/08/2021, 11:07 AM

## 2021-09-08 NOTE — ED Notes (Addendum)
Pt requested to remove NRB in order to eat breakfast. Attempted to trial pt off of NRB to HFNC. Pt's SpO2 rapidly dropped to mid 70's (lowest reading 63%). No change in pt presentation. Reapplied NRB. Instructed patient that NRB must remain on him due to him SpO2. RT to come evaluate when available.

## 2021-09-08 NOTE — ED Notes (Signed)
Per cardiologist, place pt on BiPap. Pt coughing up pink frothy sputum. Primary RN at bedside and aware.

## 2021-09-08 NOTE — Consult Note (Addendum)
Cardiology Consultation:   Patient ID: Derek Lowery MRN: [id number]; DOB: 07-03-1956  Admit date: 09/07/2021 Date of Consult: 09/08/2021  PCP:  Ladell Pier, MD   [facility] Providers Cardiologist:  Skeet Latch, MD        Patient Profile:   Derek Lowery is a 66 y.o. male with a hx of CAD s/p PCI to Eleva 2013 (residual 60% dLAD, 60% D1 disease), HfpEF, COPD, HTN, T2DM, HlD, RBBB/LPFB who is being seen 09/08/2021 for the evaluation of HF/NSTEMI at the request of Dr Shela Leff.  History of Present Illness:   [name] had acute onset chest pain 09/07/21 lasting ~1 hour, occurring mid-sternally, pressurelike, non-radiating, worse with exertion and movement, accompanied by dyspnea, diaphoresis. Resolved after 1 hour. Similar description to previous chest pain which lasts a few minutes after heavy exertion, although notably different then the shoulder/jaw pain prior to his first stent. Since has been CP free. He also reports weight gain and leg swelling over the past month; taking home torsemide 40 daily. Denies fevers, chills, constitutional symptoms; has a nonproductive cough. Presented to the Ed where Ct chest showed patchy lobar MF PNA and volume overload. BNP 600 from b/l 400. Troponin 141 -> 333 -> 714. WBC normal, procal pending. Cr 2.22 from b/l 1.6-2; BUN 37 also elevated. EKG remains RBBB/LPFB. Started on heparin gtt, given aspirin, cardiology consulted    Past Medical History:  Diagnosis Date   Arthritis    CAD (coronary artery disease)    NSTEMI 06/2011:  LHC 07/18/11: Proximal diagonal 60%, distal LAD with a diabetic appearance and 60% stenosis, OM2 with an occluded superior branch and an inferior branch with 90%, EF 55% with inferior hypokinesis.  PCI: Promus DES to the OM2 inferior branch.  This vessel provides collaterals to the superior branch which remained occluded.  Echocardiogram 07/18/11: EF 60%, normal wall motion.   Cataract    Essential  hypertension, benign    Glaucoma    Hypercholesteremia    Internal hemorrhoids    Noncompliance    Type 2 diabetes mellitus ([place]) 1990    Past Surgical History:  Procedure Laterality Date   CATARACT EXTRACTION Right 2019   Dr. Katy Fitch   CHOLECYSTECTOMY N/A 09/21/2017   Procedure: LAPAROSCOPIC CHOLECYSTECTOMY WITH INTRAOPERATIVE CHOLANGIOGRAM;  Surgeon: Michael Boston, MD;  Location: [facility];  Service: General;  Laterality: N/A;   COLONOSCOPY  2000   Dr. Collene Mares   EYE SURGERY     IRRIGATION AND DEBRIDEMENT ABSCESS Left 05/05/2014   Procedure: IRRIGATION AND DEBRIDEMENT ABSCESS left buttock;  Surgeon: Michael Boston, MD;  Location: [facility];  Service: General;  Laterality: Left;   LEFT HEART CATHETERIZATION WITH CORONARY ANGIOGRAM N/A 07/18/2011   Procedure: LEFT HEART CATHETERIZATION WITH CORONARY ANGIOGRAM;  Surgeon: Hillary Bow, MD;  Location: [facility] CATH LAB;  Service: Cardiovascular;  Laterality: N/A;   PERCUTANEOUS CORONARY STENT INTERVENTION (PCI-S)  07/18/2011   Procedure: PERCUTANEOUS CORONARY STENT INTERVENTION (PCI-S);  Surgeon: Hillary Bow, MD;  Location: [facility] CATH LAB;  Service: Cardiovascular;;       Inpatient Medications: Scheduled Meds:  amLODipine  10 mg Oral Daily   aspirin EC  81 mg Oral Daily   atorvastatin  40 mg Oral Daily   azithromycin  500 mg Oral Daily   brimonidine  1 drop Right Eye BID   dorzolamide  1 drop Right Eye BID   hydrALAZINE  75 mg Oral TID   insulin aspart  0-20 Units Subcutaneous TID WC  insulin aspart  10 Units Subcutaneous TID WC   insulin glargine-yfgn  38 Units Subcutaneous QHS   metoprolol tartrate  25 mg Oral BID   Netarsudil Dimesylate  1 drop Right Eye QHS   scopolamine  1 patch Transdermal Q72H   senna  1 tablet Oral BID   timolol  1 drop Right Eye BID   Continuous Infusions:  cefTRIAXone (ROCEPHIN)  IV     heparin 1,300 Units/hr (09/08/21 0217)   PRN Meds: acetaminophen **OR** acetaminophen, albuterol, ammonium  lactate  Allergies:    Allergies  Allergen Reactions   Coreg [Carvedilol] Other (See Comments)    Redness and swelling of calf    Social History:   Social History   Socioeconomic History   Marital status: Single    Spouse name: Not on file   Number of children: Not on file   Years of education: Not on file   Highest education level: Not on file  Occupational History   Occupation: [place]    Employer: [employer]  Tobacco Use   Smoking status: Never   Smokeless tobacco: Never  Vaping Use   Vaping Use: Never used  Substance and Sexual Activity   Alcohol use: No   Drug use: No   Sexual activity: Never  Other Topics Concern   Not on file  Social History Narrative   Works at [employer]   [phone] [employer]   Social Determinants of Health   Financial Resource Strain: Low Risk    Difficulty of Paying Living Expenses: Not hard at all  Food Insecurity: No Food Insecurity   Worried About Charity fundraiser in the Last Year: Never true   Arboriculturist in the Last Year: Never true  Transportation Needs: No Transportation Needs   Lack of Transportation (Medical): No   Lack of Transportation (Non-Medical): No  Physical Activity: Inactive   Days of Exercise per Week: 0 days   Minutes of Exercise per Session: 0 min  Stress: Stress Concern Present   Feeling of Stress : To some extent  Social Connections: Socially Isolated   Frequency of Communication with Friends and Family: Never   Frequency of Social Gatherings with Friends and Family: More than three times a week   Attends Religious Services: Never   Marine scientist or Organizations: No   Attends Music therapist: Never   Marital Status: Never married  Human resources officer Violence: Not At Risk   Fear of Current or Ex-Partner: No   Emotionally Abused: No   Physically Abused: No   Sexually Abused: No    Family History:    Family History  Problem Relation Age of Onset   Coronary artery disease  Father        Developed in his 50s   Kidney disease Father    Diabetes Father    Melanoma Father    Rectal cancer Father    Heart failure Mother    Hypertension Mother    Diabetes Sister    Diabetes Brother    Colon cancer Neg Hx    Esophageal cancer Neg Hx    Stomach cancer Neg Hx      ROS:  Please see the history of present illness.   All other ROS reviewed and negative.     Physical Exam/Data:   Vitals:   09/08/21 0115 09/08/21 0145 09/08/21 0200 09/08/21 0215  BP: (!) 154/81 (!) 155/91 139/83   Pulse: 91 90 88 92  Resp: (!) 25 (!)  22 (!) 25 (!) 24  Temp:      TempSrc:      SpO2: 92% 94% 95% 94%  Weight:      Height:        Intake/Output Summary (Last 24 hours) at 09/08/2021 0249 Last data filed at 09/07/2021 1403 Gross per 24 hour  Intake 100 ml  Output --  Net 100 ml   Last 3 Weights 09/07/2021 06/17/2021 12/30/2020  Weight (lbs) 249 lb 254 lb 3.2 oz 250 lb  Weight (kg) 112.946 kg 115.304 kg 113.399 kg     Body mass index is 35.73 kg/m.  General:  Well nourished, well developed, in no acute distress HEENT: normal, nasal cannula in place Neck: JVP 13 cm at 60 degrees Vascular: No carotid bruits; Distal pulses 2+ bilaterally Cardiac:  normal S1, S2; RRR; no murmur  Lungs:  diffuse rales Abd: soft, nontender, no hepatomegaly  Ext: 2+ LEE. lymphedema Musculoskeletal:  No deformities, BUE and BLE strength normal and equal Skin: warm and dry  Neuro:  CNs 2-12 intact, no focal abnormalities noted Psych:  Normal affect    Laboratory Data:  High Sensitivity Troponin:   Recent Labs  Lab 09/07/21 1255 09/07/21 1757 09/07/21 2110  TROPONINIHS 141* 333* 714*     Chemistry Recent Labs  Lab 09/07/21 1030  NA 139  K 3.5  CL 102  CO2 27  GLUCOSE 165*  BUN 37*  CREATININE 2.22*  CALCIUM 8.1*  GFRNONAA 32*  ANIONGAP 10    Recent Labs  Lab 09/07/21 1030  PROT 6.4*  ALBUMIN 3.2*  AST 33  ALT 20  ALKPHOS 67  BILITOT 0.5   Lipids No results  for input(s): CHOL, TRIG, HDL, LABVLDL, LDLCALC, CHOLHDL in the last 168 hours.  Hematology Recent Labs  Lab 09/07/21 1030  WBC 6.6  RBC 3.36*  HGB 9.6*  HCT 29.0*  MCV 86.3  MCH 28.6  MCHC 33.1  RDW 14.3  PLT 130*   Thyroid No results for input(s): TSH, FREET4 in the last 168 hours.  BNP Recent Labs  Lab 09/07/21 1030  BNP 620.6*    DDimer No results for input(s): DDIMER in the last 168 hours.   Radiology/Studies:  CT Chest Wo Contrast  Result Date: 09/07/2021 CLINICAL DATA:  Evaluate pneumonia. EXAM: CT CHEST WITHOUT CONTRAST TECHNIQUE: Multidetector CT imaging of the chest was performed following the standard protocol without IV contrast. RADIATION DOSE REDUCTION: This exam was performed according to the departmental dose-optimization program which includes automated exposure control, adjustment of the mA and/or kV according to patient size and/or use of iterative reconstruction technique. COMPARISON:  Chest radiograph from earlier today FINDINGS: Cardiovascular: Mild cardiac enlargement. Aortic atherosclerosis. 3 vessel coronary artery calcifications. No pericardial effusion. Mediastinum/Nodes: Normal appearance of the thyroid gland. The trachea appears patent and is midline. Normal appearance of the esophagus. No enlarged axillary or supraclavicular lymph nodes. Prominent mediastinal lymph nodes are identified. Index right paratracheal lymph node measures 1.5 cm, image 55/3. Left pre-vascular lymph node measures 1.2 cm, image 53/5. The hilar lymph nodes are suboptimally evaluated due to lack of IV contrast material. Lungs/Pleura: Trace bilateral pleural effusions. There is interlobular septal thickening within both lungs compatible with interstitial edema. Multifocal patchy areas of ground-glass and airspace consolidation are noted within both lungs. This is most severe within the posterior right upper lobe and posteromedial right lower lobe. Upper Abdomen: No acute abnormality  within the imaged portions of the upper abdomen. Aortic atherosclerosis. Status post cholecystectomy. Musculoskeletal:  No chest wall mass or suspicious bone lesions identified. IMPRESSION: 1. Multifocal patchy areas of ground-glass and airspace consolidation are noted within both lungs, most severe within the posterior right upper lobe and posteromedial right lower lobe. Findings are favored to represent multifocal pneumonia. Follow-up imaging is recommended to ensure resolution. 2. Cardiac enlargement, interstitial edema and trace bilateral pleural effusions compatible with CHF. 3. 3 vessel coronary artery calcifications. 4. Mildly enlarged mediastinal lymph nodes, nonspecific in the setting of multifocal. Infection and CHF. 5. Aortic Atherosclerosis (ICD10-I70.0). Electronically Signed   By: Kerby Moors M.D.   On: 09/07/2021 13:03   DG CHEST PORT 1 VIEW  Result Date: 09/08/2021 CLINICAL DATA:  Shortness of breath. EXAM: PORTABLE CHEST 1 VIEW COMPARISON:  09/07/2021. FINDINGS: The heart is mildly enlarged and the mediastinal contours are within normal limits. Interstitial and airspace opacities are noted in the lungs bilaterally, increased from the prior exam. No effusion or pneumothorax. No acute osseous abnormality IMPRESSION: Interstitial and airspace opacities in the lungs bilaterally, increased from the prior exam. Electronically Signed   By: Brett Fairy M.D.   On: 09/08/2021 01:59   DG Chest Portable 1 View  Result Date: 09/07/2021 CLINICAL DATA:  Productive cough.  Chest pain and abdominal pain. EXAM: PORTABLE CHEST 1 VIEW COMPARISON:  12/24/2020 FINDINGS: Stable cardiomediastinal contours. Similar to study from 06/29/2020 there are recurrent, diffuse reticular and nodular opacities identified throughout both lungs. No focal lobar consolidation identified. No signs of pleural effusion or interstitial edema. IMPRESSION: Diffuse bilateral reticulonodular pulmonary opacities are noted bilaterally.  Imaging findings are worrisome for multifocal pneumonia including atypical viral pneumonia. Follow-up imaging is advised to ensure resolution. If this does not resolve then further evaluation with CT of the chest is recommended to assess for underlying malignancy. Electronically Signed   By: Kerby Moors M.D.   On: 09/07/2021 10:56     Assessment and Plan:  Mr Saling is a 46 YOM known CAD w/ residual at least moderate CAD, HFpEF, COPd, presenting with acute onset chest pain, AEHF, and possible PNA. The Ct scan is concerning for pneumonia, but the history and exam fits much better with T1 NSTEMI and AEHF  NSTEMI, T1 vs T2: Certainly possible that this is all T2 from PNA, but CP story was fairly typical and rate of troponin rise is concerning. Agree with aspirin, heparin gtt, lipitor. Recommend adding metoprolol for BP/HR in setting of NSTEMI (reported calf swelling very unlikely due to coreg). Once better diuresed will plan for LHC, especially if renal function improves. Recurrent CP obtain EKG and give NTG. Trend troponins to peak. Risk stratification with LDL and A1c AEHF, HFpEF: Likely provoked in setting of MI but apparently volume up before this. Suspect given renal function that 40 of Iv lasix will be insufficient. If so, increase to 80 mg Iv lasix and add metolazone is this fails to adequately diurese. Agree with TTE ?PNA: Agree with treatment for now. Awaiting procalcitonin HTN: Goal < 140/90 while inpatient. Adding metoprolol as above. Agree with amlodipine, hydralazine. Very low threshold to start imdur   Risk Assessment/Risk Scores:     TIMI Risk Score for Unstable Angina or Non-ST Elevation MI:   The patient's TIMI risk score is 4, which indicates a 20% risk of all cause mortality, new or recurrent myocardial infarction or need for urgent revascularization in the next 14 days.  New York Heart Association (NYHA) Functional Class NYHA Class III      For questions or updates, please  contact [place] Please consult [email or web address] for contact info under    Signed, Martinique Tannenbaum, MD  09/08/2021 2:49 AM   Attending Addendum:   History and all data above reviewed.  Patient examined.  I agree with the findings as above.  All available labs, radiology testing, previous records reviewed. Agree with documented assessment and plan. Mr. Spragg is a 61M with CAD status post OM1 PCI, chronic diastolic heart failure, COPD, hypertension, hyperlipidemia, and diabetes admitted with acute on chronic diastolic heart failure and NSTEMI.  Prior to admission he reported chest pain.  His chest pain is since resolved but he has 2 weeks of worsening lower extremity edema, orthopnea, and PND.  At the bedside he is currently in extremitas and extremely short of breath despite being on 15 L of supplemental oxygen.  He has diffuse crackles and wheezing.  He has had minimal urine output to Lasix 40 mg IV thus far.  He reports cough productive of clear phlegm.  At the bedside he now has frothy pink sputum consistent with heart failure.  High-sensitivity troponin has continued to climb from 141 on admission to 4145 now.  #Hypoxic respiratory failure: #Acute on chronic diastolic heart failure: # ?CAP: # COPD exacerbation: Respiratory status is worsening despite diuresis.  He has not had much urinary output.  We will increase Lasix to 80 mg.  He has difficulty using the urinal but is too unstable to get out of bed.  We will place a condom catheter.  We will also start him on BiPAP.  BNP was 1000 when most recently checked.  This is increased from 620 yesterday.  He is also quite wheezy on exam.  It is possible that he has COPD exacerbation as well.  He is being treated with ceftriaxone and azithromycin for concern of pneumonia.  This does not seem to be the main contributor given his normal procalcitonin and normal white count.  Given his diabetes, heart failure, and renal dysfunction, would consider  Farxiga prior to discharge.  #NSTEMI: #Status post PCI, moderate CAD: # Hyperlipidemia:  His current presentation is consistent with NSTEMI and ischemia.  I am concerned that he has flash pulmonary edema in the setting of acute ischemia.  Continue with IV heparin for now.  Repeat CBC given that he now has pink sputum.  We will need to follow closely.  Plan for left heart cath once he is more euvolemic and stable.  Lipids are well controlled.  LDL was 54 on admission.  Continue aspirin, heparin, and metoprolol.    # Hypertension:  Continue hydralazine, amlodipine, and metoprolol.   Total critical care time: 50 minutes. Critical care time was exclusive of separately billable procedures and treating other patients. Critical care was necessary to treat or prevent imminent or life-threatening deterioration. Critical care was time spent personally by me on the following activities: development of treatment plan with patient and/or surrogate as well as nursing, discussions with consultants, evaluation of patient's response to treatment, examination of patient, obtaining history from patient or surrogate, ordering and performing treatments and interventions, ordering and review of laboratory studies, ordering and review of radiographic studies, pulse oximetry and re-evaluation of patient's condition.   Ajamu Maxon C. Oval Linsey, MD, [facility]  09/08/2021 9:51 AM

## 2021-09-08 NOTE — ED Notes (Signed)
Patient switched over from [place] to [facility] @ 15LPM

## 2021-09-08 NOTE — Progress Notes (Incomplete)
{  Select Note:[id number]} 

## 2021-09-08 NOTE — ED Notes (Signed)
Pt nauseous and not tolerating BIPAP. Pt having increased pink sputum. Asked Network engineer to page Wynelle Cleveland, MD.

## 2021-09-08 NOTE — Progress Notes (Signed)
ANTICOAGULATION CONSULT NOTE  Pharmacy Consult for heparin Indication: chest pain/ACS  Allergies  Allergen Reactions   Coreg [Carvedilol] Other (See Comments)    Redness and swelling of calf    Patient Measurements: Height: _0  (177.8 cm) Weight: 112.9 kg (249 lb) IBW/kg (Calculated) : 73 Heparin Dosing Weight: 97.8 kg   Vital Signs: Temp: 98.6 F (37 C) (01/22 1206) BP: 133/78 (01/22 1325) Pulse Rate: 75 (01/22 1325)  Labs: Recent Labs    09/07/21 1030 09/07/21 1255 09/07/21 2110 09/08/21 0217 09/08/21 0426 09/08/21 0556 09/08/21 1205 09/08/21 1254 09/08/21 1403  HGB 9.6*  --   --   --    < > 8.9* 9.9* 9.5*  --   HCT 29.0*  --   --   --    < > 27.1* 29.0* 28.0*  --   PLT 130*  --   --   --   --  156  --   --   --   HEPARINUNFRC  --   --   --   --   --   --   --   --  0.44  CREATININE 2.22*  --   --  2.52*  --   --   --   --  2.75*  TROPONINIHS  --    < > 714* 2,512*  --  4,145*  --   --   --    < > = values in this interval not displayed.     Estimated Creatinine Clearance: 34.6 mL/min (A) (by C-G formula based on SCr of 2.75 mg/dL (H)).   Medical History: Past Medical History:  Diagnosis Date   Arthritis    CAD (coronary artery disease)    NSTEMI 06/2011:  LHC 07/18/11: Proximal diagonal 60%, distal LAD with a diabetic appearance and 60% stenosis, OM2 with an occluded superior branch and an inferior branch with 90%, EF 55% with inferior hypokinesis.  PCI: Promus DES to the OM2 inferior branch.  This vessel provides collaterals to the superior branch which remained occluded.  Echocardiogram 07/18/11: EF 60%, normal wall motion.   Cataract    Essential hypertension, benign    Glaucoma    Hypercholesteremia    Internal hemorrhoids    Noncompliance    Type 2 diabetes mellitus ([place]) 1990    Medications:  Medications Prior to Admission  Medication Sig Dispense Refill Last Dose   amLODipine (NORVASC) 10 MG tablet Take 1 tablet (10 mg total) by mouth  daily. 90 tablet 3 09/07/2021   ammonium lactate (AMLACTIN) 12 % lotion Apply to both feet twice daily for dry skin. (Patient taking differently: 1 application as needed for dry skin. Apply to both feet twice daily for dry skin.) 400 g 6 Past Month   aspirin EC 81 MG tablet Take 1 tablet (81 mg total) by mouth daily. 30 tablet 4 09/07/2021   atorvastatin (LIPITOR) 40 MG tablet TAKE 1 TABLET BY MOUTH EVERY DAY (Patient taking differently: Take 40 mg by mouth daily. TAKE 1 TABLET BY MOUTH EVERY DAY) 90 tablet 1 09/07/2021   brimonidine (ALPHAGAN) 0.2 % ophthalmic solution Place 1 drop into the right eye 2 (two) times daily.    09/07/2021   dorzolamide (TRUSOPT) 2 % ophthalmic solution Place 1 drop into the right eye 2 (two) times daily.   09/07/2021   HUMALOG KWIKPEN 100 UNIT/ML KwikPen INJECT 10 UNITS INTO THE SKIN 3 (THREE) TIMES DAILY WITH MEALS. (Patient taking differently: Inject 10 Units into the skin  3 (three) times daily.) 15 mL 3 09/07/2021   hydrALAZINE (APRESOLINE) 50 MG tablet Take 1.5 tablets (75 mg total) by mouth 3 (three) times daily. 135 tablet 6 09/07/2021   insulin glargine (LANTUS SOLOSTAR) 100 UNIT/ML Solostar Pen Inject 38 Units into the skin at bedtime. 15 mL 6 09/06/2021   Multiple Vitamins-Minerals (MULTIVITAMIN WITH MINERALS) tablet Take 1 tablet by mouth daily.   09/07/2021   RHOPRESSA 0.02 % SOLN Place 1 drop into the right eye at bedtime.   09/06/2021   timolol (TIMOPTIC) 0.5 % ophthalmic solution Place 1 drop into the right eye 2 (two) times daily.   09/07/2021   BD PEN NEEDLE NANO 2ND GEN 32G X 4 MM MISC USE AS DIRECTED 3 TIMES A DAY. PLEASE SCHEDULE AN APPOINTMENT FOR ADDITIONAL REFILLS. 100 each 3    blood glucose meter kit and supplies KIT Dispense based on patient and insurance preference. Use up to four times daily as directed. One Touch Verio 1 each 0    Continuous Blood Gluc Receiver (DEXCOM G6 RECEIVER) DEVI 1 Device by Does not apply route daily. 1 each 0    Continuous Blood  Gluc Receiver (FREESTYLE LIBRE 14 DAY READER) DEVI USE AS DIRECTED 1 each 2    Continuous Blood Gluc Sensor (DEXCOM G6 SENSOR) MISC 1 packet by Does not apply route daily. 3 each 11    Continuous Blood Gluc Sensor (FREESTYLE LIBRE SENSOR SYSTEM) MISC Change sensor Q 2 wks 2 each 12    glucose blood (ACCU-CHEK AVIVA PLUS) test strip Use as instructed for 3 times daily testing of blood sugar. E11.9 100 each 6    glucose monitoring kit (FREESTYLE) monitoring kit 1 each by Does not apply route as needed for other. 1 each 0    Lancets MISC Use as directed.  Accu chek 2 100 each 11    latanoprost (XALATAN) 0.005 % ophthalmic solution Place 1 drop into the right eye at bedtime.      torsemide (DEMADEX) 20 MG tablet TAKE 2 TABLETS (40 MG TOTAL) BY MOUTH EVERY DAY (Patient taking differently: Take 40 mg by mouth daily.) 60 tablet 6     Assessment: 62 YOM w/ h/o PCI/DES stenting in 2012, presents with increasing SOB. Troponin trending up. Pharmacy consulted to start IV heparin for ACS.   Heparin level is therapeutic at 0.44, on 1300 units/hr. Hgb 8.9, plt 156. Trop 789>7847. No s/sx of bleeding or infusion issues.   Goal of Therapy:  Heparin level 0.3-0.7 units/ml Monitor platelets by anticoagulation protocol: Yes   Plan:  -Continue heparin infusion at 1300 units/hr  -F/u HL in AM -Monitor daily HL, CBC and s/s of bleeding   Antonietta Jewel, PharmD, BCCCP Clinical Pharmacist  Phone: [phone] 09/08/2021 3:26 PM  Please check AMION for all [place] phone numbers After 10:00 PM, call [place] [phone]

## 2021-09-08 NOTE — ED Notes (Signed)
Paged Rizwan MD notifying of pt respiratory status and need for face to face assessment d/t tachypnea, labored breathing, and hypoxia despite NRB at 15L. Awaiting response.

## 2021-09-08 NOTE — ED Notes (Signed)
Hospitalist returned page. Critical care to be consulted. Orders for zofran to be placed.

## 2021-09-08 NOTE — Progress Notes (Signed)
Peripherally Inserted Central Catheter Placement  The IV Nurse has discussed with the patient and/or persons authorized to consent for the patient, the purpose of this procedure and the potential benefits and risks involved with this procedure.  The benefits include less needle sticks, lab draws from the catheter, and the patient may be discharged home with the catheter. Risks include, but not limited to, infection, bleeding, blood clot (thrombus formation), and puncture of an artery; nerve damage and irregular heartbeat and possibility to perform a PICC exchange if needed/ordered by physician.  Alternatives to this procedure were also discussed.  Bard Power PICC patient education guide, fact sheet on infection prevention and patient information card has been provided to patient /or left at bedside.    PICC Placement Documentation  PICC Triple Lumen 15/86/82 PICC Right Basilic 41 cm 0 cm (Active)  Indication for Insertion or Continuance of Line Vasoactive infusions 09/08/21 1840  Exposed Catheter (cm) 0 cm 09/08/21 1840  Site Assessment Clean;Dry;Intact 09/08/21 1840  Lumen #1 Status Flushed;Saline locked;Blood return noted 09/08/21 1840  Lumen #2 Status Flushed;Saline locked;Blood return noted 09/08/21 1840  Lumen #3 Status Flushed;Saline locked;Blood return noted 09/08/21 1840  Dressing Type Transparent;Securing device 09/08/21 1840  Dressing Status Clean;Dry;Intact 09/08/21 1840  Antimicrobial disc in place? Yes 09/08/21 1840  Safety Lock Not Applicable 57/49/35 5217  Line Care Connections checked and tightened 09/08/21 1840  Line Adjustment (NICU/IV Team Only) No 09/08/21 1840  Dressing Intervention New dressing;Antimicrobial disc changed;Securement device changed 09/08/21 1840  Dressing Change Due 09/15/21 09/08/21 [place] 09/08/2021, 6:46 PM

## 2021-09-08 NOTE — ED Notes (Signed)
Pt called nurses station. Pt reporting that he wanted to get up to urinate w/ bedside commode. This RN told pt that he isn't stable to be able to use bedside commode at this time d/t tachypnea, labored breathing and O2 sats 85% w/ NRB on. Notified RT.

## 2021-09-08 NOTE — ED Notes (Signed)
Rizwan MD at bedside

## 2021-09-08 NOTE — ED Notes (Signed)
Rizwan MD called back and is on her way to assess pt.

## 2021-09-08 NOTE — Progress Notes (Signed)
ANTICOAGULATION CONSULT NOTE - Initial Consult  Pharmacy Consult for heparin Indication: chest pain/ACS  Allergies  Allergen Reactions   Coreg [Carvedilol] Other (See Comments)    Redness and swelling of calf    Patient Measurements: Height: 5' 10"  (177.8 cm) Weight: 112.9 kg (249 lb) IBW/kg (Calculated) : 73 Heparin Dosing Weight: 97.8 kg   Vital Signs: Temp: 98.5 F (36.9 C) (01/21 1730) Temp Source: Oral (01/21 1730) BP: 154/81 (01/22 0115) Pulse Rate: 91 (01/22 0115)  Labs: Recent Labs    09/07/21 1030 09/07/21 1255 09/07/21 1757 09/07/21 2110  HGB 9.6*  --   --   --   HCT 29.0*  --   --   --   PLT 130*  --   --   --   CREATININE 2.22*  --   --   --   TROPONINIHS  --  141* 333* 714*    Estimated Creatinine Clearance: 42.9 mL/min (A) (by C-G formula based on SCr of 2.22 mg/dL (H)).   Medical History: Past Medical History:  Diagnosis Date   Arthritis    CAD (coronary artery disease)    NSTEMI 06/2011:  LHC 07/18/11: Proximal diagonal 60%, distal LAD with a diabetic appearance and 60% stenosis, OM2 with an occluded superior branch and an inferior branch with 90%, EF 55% with inferior hypokinesis.  PCI: Promus DES to the OM2 inferior branch.  This vessel provides collaterals to the superior branch which remained occluded.  Echocardiogram 07/18/11: EF 60%, normal wall motion.   Cataract    Essential hypertension, benign    Glaucoma    Hypercholesteremia    Internal hemorrhoids    Noncompliance    Type 2 diabetes mellitus ([place]) 1990    Medications:  (Not in a hospital admission)   Assessment: 69 YOM w/ h/o PCI/DES stenting in 2012, presents with increasing SOB. Troponin trending up. Pharmacy consulted to start IV heparin for ACS.   Hgb and Plt are low. SCr elevated.   Goal of Therapy:  Heparin level 0.3-0.7 units/ml Monitor platelets by anticoagulation protocol: Yes   Plan:  -Heparin 4000 units IV bolus followed by heparin infusion at 1300 units/hr   -F/u HL in AM -Monitor daily HL, CBC and s/s of bleeding   Albertina Parr, PharmD., BCPS, BCCCP Clinical Pharmacist Please refer to [facility] for unit-specific pharmacist

## 2021-09-08 NOTE — ED Notes (Signed)
Critical Care at bedside.  

## 2021-09-08 NOTE — Progress Notes (Addendum)
Notified Dr. Vaughan Browner of uop since starting lasix gtt at 56m/hr.  ECHO was just completed as well.  Orders placed for PICC line.

## 2021-09-08 NOTE — ED Notes (Signed)
Called 2H to give report. Nurse in another room. Will call back in about 5 minutes.

## 2021-09-09 ENCOUNTER — Inpatient Hospital Stay (HOSPITAL_COMMUNITY): Payer: 59

## 2021-09-09 DIAGNOSIS — I5021 Acute systolic (congestive) heart failure: Secondary | ICD-10-CM

## 2021-09-09 DIAGNOSIS — N179 Acute kidney failure, unspecified: Secondary | ICD-10-CM

## 2021-09-09 DIAGNOSIS — I5041 Acute combined systolic (congestive) and diastolic (congestive) heart failure: Secondary | ICD-10-CM

## 2021-09-09 DIAGNOSIS — N1831 Chronic kidney disease, stage 3a: Secondary | ICD-10-CM | POA: Diagnosis not present

## 2021-09-09 DIAGNOSIS — I5033 Acute on chronic diastolic (congestive) heart failure: Secondary | ICD-10-CM | POA: Diagnosis not present

## 2021-09-09 DIAGNOSIS — I214 Non-ST elevation (NSTEMI) myocardial infarction: Secondary | ICD-10-CM | POA: Diagnosis not present

## 2021-09-09 LAB — COOXEMETRY PANEL
Carboxyhemoglobin: 1.1 % (ref 0.5–1.5)
Methemoglobin: 1.2 % (ref 0.0–1.5)
O2 Saturation: 70.1 %
Total hemoglobin: 8.3 g/dL — ABNORMAL LOW (ref 12.0–16.0)

## 2021-09-09 LAB — HEPARIN LEVEL (UNFRACTIONATED)
Heparin Unfractionated: >1.1 IU/mL — ABNORMAL HIGH (ref 0.30–0.70)
Heparin Unfractionated: 0.43 IU/mL (ref 0.30–0.70)

## 2021-09-09 LAB — BASIC METABOLIC PANEL
Anion gap: 11 (ref 5–15)
BUN: 61 mg/dL — ABNORMAL HIGH (ref 8–23)
CO2: 28 mmol/L (ref 22–32)
Calcium: 7.9 mg/dL — ABNORMAL LOW (ref 8.9–10.3)
Chloride: 100 mmol/L (ref 98–111)
Creatinine, Ser: 3.15 mg/dL — ABNORMAL HIGH (ref 0.61–1.24)
GFR, Estimated: 21 mL/min — ABNORMAL LOW (ref >60)
Glucose, Bld: 92 mg/dL (ref 70–99)
Potassium: 3.4 mmol/L — ABNORMAL LOW (ref 3.5–5.1)
Sodium: 139 mmol/L (ref 135–145)

## 2021-09-09 LAB — HEMOGLOBIN AND HEMATOCRIT, BLOOD
HCT: 26.7 % — ABNORMAL LOW (ref 39.0–52.0)
HCT: 22.5 % — ABNORMAL LOW (ref 39.0–52.0)
Hemoglobin: 8.8 g/dL — ABNORMAL LOW (ref 13.0–17.0)
Hemoglobin: 7.3 g/dL — ABNORMAL LOW (ref 13.0–17.0)

## 2021-09-09 LAB — GLUCOSE, CAPILLARY
Glucose-Capillary: 112 mg/dL — ABNORMAL HIGH (ref 70–99)
Glucose-Capillary: 81 mg/dL (ref 70–99)
Glucose-Capillary: 73 mg/dL (ref 70–99)
Glucose-Capillary: 74 mg/dL (ref 70–99)
Glucose-Capillary: 81 mg/dL (ref 70–99)
Glucose-Capillary: 85 mg/dL (ref 70–99)

## 2021-09-09 LAB — CBC
HCT: 23.3 % — ABNORMAL LOW (ref 39.0–52.0)
Hemoglobin: 7.4 g/dL — ABNORMAL LOW (ref 13.0–17.0)
MCH: 28.1 pg (ref 26.0–34.0)
MCHC: 31.8 g/dL (ref 30.0–36.0)
MCV: 88.6 fL (ref 80.0–100.0)
Platelets: 134 10*3/uL — ABNORMAL LOW (ref 150–400)
RBC: 2.63 MIL/uL — ABNORMAL LOW (ref 4.22–5.81)
RDW: 14.6 % (ref 11.5–15.5)
WBC: 9.5 10*3/uL (ref 4.0–10.5)
nRBC: 0 % (ref 0.0–0.2)

## 2021-09-09 LAB — PHOSPHORUS: Phosphorus: 4.4 mg/dL (ref 2.5–4.6)

## 2021-09-09 LAB — MAGNESIUM: Magnesium: 2.3 mg/dL (ref 1.7–2.4)

## 2021-09-09 LAB — HEMOGLOBIN A1C
Hgb A1c MFr Bld: 7.1 % — ABNORMAL HIGH (ref 4.8–5.6)
Mean Plasma Glucose: 157 mg/dL

## 2021-09-09 LAB — BRAIN NATRIURETIC PEPTIDE: B Natriuretic Peptide: 618.3 pg/mL — ABNORMAL HIGH (ref 0.0–100.0)

## 2021-09-09 LAB — PROCALCITONIN: Procalcitonin: 5.65 ng/mL

## 2021-09-09 MED ORDER — CARVEDILOL 12.5 MG PO TABS
12.5000 mg | ORAL_TABLET | Freq: Two times a day (BID) | ORAL | Status: DC
  Administered 2021-09-09 – 2021-09-16 (×13): 12.5 mg via ORAL
  Filled 2021-09-09 (×14): qty 1

## 2021-09-09 MED ORDER — HYDRALAZINE HCL 50 MG PO TABS
100.0000 mg | ORAL_TABLET | Freq: Three times a day (TID) | ORAL | Status: DC
  Administered 2021-09-09 – 2021-09-16 (×21): 100 mg via ORAL
  Filled 2021-09-09 (×21): qty 2

## 2021-09-09 MED ORDER — INSULIN GLARGINE-YFGN 100 UNIT/ML ~~LOC~~ SOLN
19.0000 [IU] | Freq: Every day | SUBCUTANEOUS | Status: DC
  Filled 2021-09-09: qty 0.19

## 2021-09-09 MED ORDER — POTASSIUM CHLORIDE CRYS ER 20 MEQ PO TBCR
20.0000 meq | EXTENDED_RELEASE_TABLET | Freq: Once | ORAL | Status: AC
  Administered 2021-09-09: 20 meq via ORAL
  Filled 2021-09-09: qty 1

## 2021-09-09 MED ORDER — GUAIFENESIN ER 600 MG PO TB12
600.0000 mg | ORAL_TABLET | Freq: Two times a day (BID) | ORAL | Status: DC
  Administered 2021-09-09 – 2021-09-16 (×15): 600 mg via ORAL
  Filled 2021-09-09 (×15): qty 1

## 2021-09-09 MED ORDER — CARVEDILOL 12.5 MG PO TABS: 12.5000 mg | ORAL_TABLET | Freq: Two times a day (BID) | ORAL | Status: DC

## 2021-09-09 MED ORDER — PANTOPRAZOLE SODIUM 40 MG PO TBEC
40.0000 mg | DELAYED_RELEASE_TABLET | Freq: Two times a day (BID) | ORAL | Status: DC
  Administered 2021-09-09 – 2021-09-13 (×9): 40 mg via ORAL
  Filled 2021-09-09 (×9): qty 1

## 2021-09-09 MED ORDER — POTASSIUM CHLORIDE CRYS ER 20 MEQ PO TBCR
40.0000 meq | EXTENDED_RELEASE_TABLET | Freq: Once | ORAL | Status: AC
  Administered 2021-09-09: 40 meq via ORAL
  Filled 2021-09-09: qty 2

## 2021-09-09 MED ORDER — INSULIN ASPART 100 UNIT/ML IJ SOLN
0.0000 [IU] | INTRAMUSCULAR | Status: DC
  Administered 2021-09-10: 21:00:00 3 [IU] via SUBCUTANEOUS
  Administered 2021-09-11 – 2021-09-12 (×6): 2 [IU] via SUBCUTANEOUS

## 2021-09-09 MED ORDER — ISOSORBIDE MONONITRATE ER 60 MG PO TB24
60.0000 mg | ORAL_TABLET | Freq: Every day | ORAL | Status: DC
  Administered 2021-09-09 – 2021-09-16 (×8): 60 mg via ORAL
  Filled 2021-09-09 (×8): qty 1

## 2021-09-09 NOTE — Progress Notes (Signed)
Heart Failure Nurse Navigator Progress Note  Following this hospitalization to assess for HV TOC readiness. Per chart review pt did not have cardiology follow up since last notes from May 2022. Currently off BiPAP. Limiting issues: PNA, amenia, AKI. Will plan to interview as patient increases to stabilize and move out of ICU. Will plan to bring to [facility] if patient agreeable and functional status improves.   Pricilla Holm, MSN, RN Heart Failure Nurse Navigator [phone]

## 2021-09-09 NOTE — Progress Notes (Signed)
ANTICOAGULATION CONSULT NOTE  Pharmacy Consult for heparin Indication: chest pain/ACS  Allergies  Allergen Reactions   Coreg [Carvedilol] Other (See Comments)    Redness and swelling of calf    Patient Measurements: Height: 5' 10"  (177.8 cm) Weight: 120.5 kg (265 lb 10.5 oz) IBW/kg (Calculated) : 73 Heparin Dosing Weight: 97.8 kg   Vital Signs: Temp: 98.1 F (36.7 C) (01/23 1123) Temp Source: Oral (01/23 1123) BP: 131/67 (01/23 1200) Pulse Rate: 77 (01/23 1417)  Labs: Recent Labs    09/07/21 1030 09/07/21 1255 09/07/21 2110 09/08/21 0217 09/08/21 0426 09/08/21 0556 09/08/21 1205 09/08/21 1254 09/08/21 1403 09/09/21 0405 09/09/21 0553 09/09/21 1138  HGB 9.6*  --   --   --    < > 8.9* 9.9* 9.5*  --  7.4*  --   --   HCT 29.0*  --   --   --    < > 27.1* 29.0* 28.0*  --  23.3*  --   --   PLT 130*  --   --   --   --  156  --   --   --  134*  --   --   HEPARINUNFRC  --   --   --   --   --   --   --   --  0.44 >1.10* 0.43  --   CREATININE 2.22*  --   --  2.52*  --   --   --   --  2.75*  --   --  3.15*  TROPONINIHS  --    < > 714* 2,512*  --  4,145*  --   --   --   --   --   --    < > = values in this interval not displayed.     Estimated Creatinine Clearance: 31.2 mL/min (A) (by C-G formula based on SCr of 3.15 mg/dL (H)).   Medical History: Past Medical History:  Diagnosis Date   Arthritis    CAD (coronary artery disease)    NSTEMI 06/2011:  LHC 07/18/11: Proximal diagonal 60%, distal LAD with a diabetic appearance and 60% stenosis, OM2 with an occluded superior branch and an inferior branch with 90%, EF 55% with inferior hypokinesis.  PCI: Promus DES to the OM2 inferior branch.  This vessel provides collaterals to the superior branch which remained occluded.  Echocardiogram 07/18/11: EF 60%, normal wall motion.   Cataract    Essential hypertension, benign    Glaucoma    Hypercholesteremia    Internal hemorrhoids    Noncompliance    Type 2 diabetes mellitus  ([place]) 1990    Medications:  Medications Prior to Admission  Medication Sig Dispense Refill Last Dose   amLODipine (NORVASC) 10 MG tablet Take 1 tablet (10 mg total) by mouth daily. 90 tablet 3 09/07/2021   ammonium lactate (AMLACTIN) 12 % lotion Apply to both feet twice daily for dry skin. (Patient taking differently: 1 application as needed for dry skin. Apply to both feet twice daily for dry skin.) 400 g 6 Past Month   aspirin EC 81 MG tablet Take 1 tablet (81 mg total) by mouth daily. 30 tablet 4 09/07/2021   atorvastatin (LIPITOR) 40 MG tablet TAKE 1 TABLET BY MOUTH EVERY DAY (Patient taking differently: Take 40 mg by mouth daily. TAKE 1 TABLET BY MOUTH EVERY DAY) 90 tablet 1 09/07/2021   brimonidine (ALPHAGAN) 0.2 % ophthalmic solution Place 1 drop into the right eye 2 (two) times  daily.    09/07/2021   dorzolamide (TRUSOPT) 2 % ophthalmic solution Place 1 drop into the right eye 2 (two) times daily.   09/07/2021   HUMALOG KWIKPEN 100 UNIT/ML KwikPen INJECT 10 UNITS INTO THE SKIN 3 (THREE) TIMES DAILY WITH MEALS. (Patient taking differently: Inject 10 Units into the skin 3 (three) times daily.) 15 mL 3 09/07/2021   hydrALAZINE (APRESOLINE) 50 MG tablet Take 1.5 tablets (75 mg total) by mouth 3 (three) times daily. 135 tablet 6 09/07/2021   insulin glargine (LANTUS SOLOSTAR) 100 UNIT/ML Solostar Pen Inject 38 Units into the skin at bedtime. 15 mL 6 09/06/2021   Multiple Vitamins-Minerals (MULTIVITAMIN WITH MINERALS) tablet Take 1 tablet by mouth daily.   09/07/2021   RHOPRESSA 0.02 % SOLN Place 1 drop into the right eye at bedtime.   09/06/2021   timolol (TIMOPTIC) 0.5 % ophthalmic solution Place 1 drop into the right eye 2 (two) times daily.   09/07/2021   BD PEN NEEDLE NANO 2ND GEN 32G X 4 MM MISC USE AS DIRECTED 3 TIMES A DAY. PLEASE SCHEDULE AN APPOINTMENT FOR ADDITIONAL REFILLS. 100 each 3    blood glucose meter kit and supplies KIT Dispense based on patient and insurance preference. Use up to four  times daily as directed. One Touch Verio 1 each 0    Continuous Blood Gluc Receiver (DEXCOM G6 RECEIVER) DEVI 1 Device by Does not apply route daily. 1 each 0    Continuous Blood Gluc Receiver (FREESTYLE LIBRE 14 DAY READER) DEVI USE AS DIRECTED 1 each 2    Continuous Blood Gluc Sensor (DEXCOM G6 SENSOR) MISC 1 packet by Does not apply route daily. 3 each 11    Continuous Blood Gluc Sensor (FREESTYLE LIBRE SENSOR SYSTEM) MISC Change sensor Q 2 wks 2 each 12    glucose blood (ACCU-CHEK AVIVA PLUS) test strip Use as instructed for 3 times daily testing of blood sugar. E11.9 100 each 6    glucose monitoring kit (FREESTYLE) monitoring kit 1 each by Does not apply route as needed for other. 1 each 0    Lancets MISC Use as directed.  Accu chek 2 100 each 11    latanoprost (XALATAN) 0.005 % ophthalmic solution Place 1 drop into the right eye at bedtime.      torsemide (DEMADEX) 20 MG tablet TAKE 2 TABLETS (40 MG TOTAL) BY MOUTH EVERY DAY (Patient taking differently: Take 40 mg by mouth daily.) 60 tablet 6     Assessment: 51 YOM w/ h/o PCI/DES stenting in 2012, presents with increasing SOB. Troponin trending up. Pharmacy consulted to start IV heparin for ACS.   Heparin level is therapeutic at 0.44, on 1300 units/hr. Hgb fell 7.4 plt stable 130s . Trop [id number].  With large drop in hgb will hold heparin drip for now    Goal of Therapy:  Heparin level 0.3-0.7 units/ml Monitor platelets by anticoagulation protocol: Yes   Plan:  F/u restart anticoagulation if needed as hgb [place].D. CPP, BCPS Clinical Pharmacist [phone] 09/09/2021 2:40 PM    Please check AMION for all [place] phone numbers After 10:00 PM, call [place] [phone]

## 2021-09-09 NOTE — Progress Notes (Signed)
Progress Note  Patient Name: Derek Lowery Date of Encounter: 09/09/2021  [facility] Cardiologist: Skeet Latch, MD   Subjective   Feeling better.  Breathing is improving.  Denies melena or hematochezia at home.  Denies abdominal pain.  Inpatient Medications    Scheduled Meds:  amLODipine  10 mg Oral Daily   aspirin EC  81 mg Oral Daily   atorvastatin  40 mg Oral Daily   azithromycin  500 mg Oral Daily   brimonidine  1 drop Right Eye BID   Chlorhexidine Gluconate Cloth  6 each Topical Daily   dorzolamide  1 drop Right Eye BID   hydrALAZINE  75 mg Oral TID   insulin aspart  0-9 Units Subcutaneous Q4H   insulin glargine-yfgn  19 Units Subcutaneous QHS   metoprolol tartrate  25 mg Oral BID   Netarsudil Dimesylate  1 drop Right Eye QHS   scopolamine  1 patch Transdermal Q72H   senna  1 tablet Oral BID   sodium chloride flush  10-40 mL Intracatheter Q12H   timolol  1 drop Right Eye BID   Continuous Infusions:  sodium chloride Stopped (09/08/21 1437)   cefTRIAXone (ROCEPHIN)  IV Stopped (09/08/21 1418)   furosemide (LASIX) 200 mg in dextrose 5% 100 mL (49m/mL) infusion 10 mg/hr (09/09/21 0600)   heparin 1,300 Units/hr (09/09/21 0600)   PRN Meds: sodium chloride, acetaminophen **OR** acetaminophen, albuterol, ammonium lactate, docusate sodium, ondansetron (ZOFRAN) IV, polyethylene glycol, sodium chloride flush   Vital Signs    Vitals:   09/09/21 0830 09/09/21 0833 09/09/21 0934 09/09/21 1123  BP: (!) 144/75  (!) 143/74 123/66  Pulse: 74 78 87   Resp: (!) 23 16 16 14   Temp:      TempSrc:      SpO2: 99% 96% 94% 92%  Weight:      Height:        Intake/Output Summary (Last 24 hours) at 09/09/2021 1126 Last data filed at 09/09/2021 0950 Gross per 24 hour  Intake 430.06 ml  Output 1820 ml  Net -1389.94 ml   Last 3 Weights 09/09/2021 09/07/2021 06/17/2021  Weight (lbs) 265 lb 10.5 oz 249 lb 254 lb 3.2 oz  Weight (kg) 120.5 kg 112.946 kg 115.304 kg       Telemetry    Sinus rhythm.  PVCs.- Personally Reviewed  ECG    Sinus rhythm.  Rate 94 bpm.  Right bundle branch block.  Left posterior fascicular block. - Personally Reviewed  Physical Exam   VS:  BP 123/66    Pulse 87    Temp 98.1 F (36.7 C) (Oral)    Resp 14    Ht 5' 10"  (1.778 m)    Wt 120.5 kg    SpO2 92%    BMI 38.12 kg/m  , BMI Body mass index is 38.12 kg/m. GENERAL: Critically ill-appearing.  Moderate respiratory distress. HEENT: Pupils equal round and reactive, fundi not visualized, oral mucosa unremarkable NECK:  No jugular venous distention, waveform within normal limits, carotid upstroke brisk and symmetric, no bruits, no thyromegaly LUNGS: Coarse breath with rhonchi and wheezing bilaterally. HEART:  RRR.  PMI not displaced or sustained,S1 and S2 within normal limits, no S3, no S4, no clicks, no rubs, no murmurs ABD:  Flat, positive bowel sounds normal in frequency in pitch, no bruits, no rebound, no guarding, no midline pulsatile mass, no hepatomegaly, no splenomegaly EXT:  2 plus pulses throughout, 2 +woody lower extremity edema edema bilaterally bilaterally, no cyanosis  no clubbing SKIN:  No rashes no nodules NEURO:  Cranial nerves II through XII grossly intact, motor grossly intact throughout PSYCH:  Cognitively intact, oriented to person place and time   Labs    High Sensitivity Troponin:   Recent Labs  Lab 09/07/21 1255 09/07/21 1757 09/07/21 2110 09/08/21 0217 09/08/21 0556  TROPONINIHS 141* 333* 714* 2,512* 4,145*     Chemistry Recent Labs  Lab 09/07/21 1030 09/08/21 0217 09/08/21 0426 09/08/21 1205 09/08/21 1254 09/08/21 1403 09/09/21 0405  NA 139 137   < > 138 138 134*  --   K 3.5 4.0   < > 3.9 4.0 4.0  --   CL 102 100  --   --   --  102  --   CO2 27 27  --   --   --  24  --   GLUCOSE 165* 132*  --   --   --  137*  --   BUN 37* 43*  --   --   --  51*  --   CREATININE 2.22* 2.52*  --   --   --  2.75*  --   CALCIUM 8.1* 7.9*  --   --   --   7.4*  --   MG  --   --   --   --   --   --  2.3  PROT 6.4*  --   --   --   --   --   --   ALBUMIN 3.2*  --   --   --   --   --   --   AST 33  --   --   --   --   --   --   ALT 20  --   --   --   --   --   --   ALKPHOS 67  --   --   --   --   --   --   BILITOT 0.5  --   --   --   --   --   --   GFRNONAA 32* 28*  --   --   --  25*  --   ANIONGAP 10 10  --   --   --  8  --    < > = values in this interval not displayed.    Lipids  Recent Labs  Lab 09/08/21 0500  CHOL 95  TRIG 38  HDL 33*  LDLCALC 54  CHOLHDL 2.9    Hematology Recent Labs  Lab 09/07/21 1030 09/08/21 0426 09/08/21 0556 09/08/21 1205 09/08/21 1254 09/09/21 0405  WBC 6.6  --  9.8  --   --  9.5  RBC 3.36*  --  3.07*  --   --  2.63*  HGB 9.6*   < > 8.9* 9.9* 9.5* 7.4*  HCT 29.0*   < > 27.1* 29.0* 28.0* 23.3*  MCV 86.3  --  88.3  --   --  88.6  MCH 28.6  --  29.0  --   --  28.1  MCHC 33.1  --  32.8  --   --  31.8  RDW 14.3  --  14.5  --   --  14.6  PLT 130*  --  156  --   --  134*   < > = values in this interval not displayed.   Thyroid No results for input(s): TSH, FREET4 in the last 168 hours.  BNP Recent Labs  Lab 09/08/21 0500 09/08/21 1403 09/09/21 0405  BNP 1,022.5* 1,081.8* 618.3*    DDimer No results for input(s): DDIMER in the last 168 hours.   Radiology    CT Chest Wo Contrast  Result Date: 09/07/2021 CLINICAL DATA:  Evaluate pneumonia. EXAM: CT CHEST WITHOUT CONTRAST TECHNIQUE: Multidetector CT imaging of the chest was performed following the standard protocol without IV contrast. RADIATION DOSE REDUCTION: This exam was performed according to the departmental dose-optimization program which includes automated exposure control, adjustment of the mA and/or kV according to patient size and/or use of iterative reconstruction technique. COMPARISON:  Chest radiograph from earlier today FINDINGS: Cardiovascular: Mild cardiac enlargement. Aortic atherosclerosis. 3 vessel coronary artery  calcifications. No pericardial effusion. Mediastinum/Nodes: Normal appearance of the thyroid gland. The trachea appears patent and is midline. Normal appearance of the esophagus. No enlarged axillary or supraclavicular lymph nodes. Prominent mediastinal lymph nodes are identified. Index right paratracheal lymph node measures 1.5 cm, image 55/3. Left pre-vascular lymph node measures 1.2 cm, image 53/5. The hilar lymph nodes are suboptimally evaluated due to lack of IV contrast material. Lungs/Pleura: Trace bilateral pleural effusions. There is interlobular septal thickening within both lungs compatible with interstitial edema. Multifocal patchy areas of ground-glass and airspace consolidation are noted within both lungs. This is most severe within the posterior right upper lobe and posteromedial right lower lobe. Upper Abdomen: No acute abnormality within the imaged portions of the upper abdomen. Aortic atherosclerosis. Status post cholecystectomy. Musculoskeletal: No chest wall mass or suspicious bone lesions identified. IMPRESSION: 1. Multifocal patchy areas of ground-glass and airspace consolidation are noted within both lungs, most severe within the posterior right upper lobe and posteromedial right lower lobe. Findings are favored to represent multifocal pneumonia. Follow-up imaging is recommended to ensure resolution. 2. Cardiac enlargement, interstitial edema and trace bilateral pleural effusions compatible with CHF. 3. 3 vessel coronary artery calcifications. 4. Mildly enlarged mediastinal lymph nodes, nonspecific in the setting of multifocal. Infection and CHF. 5. Aortic Atherosclerosis (ICD10-I70.0). Electronically Signed   By: Kerby Moors M.D.   On: 09/07/2021 13:03   DG CHEST PORT 1 VIEW  Result Date: 09/09/2021 CLINICAL DATA:  Respiratory complication. EXAM: PORTABLE CHEST 1 VIEW COMPARISON:  September 08, 2021. FINDINGS: Stable cardiomegaly. Stable bilateral lung opacities are noted, left greater  than right, consistent with pneumonia and possible edema is well. Interval placement of right-sided PICC line with distal tip in expected position of the SVC. Bony thorax is unremarkable. IMPRESSION: Stable bilateral lung opacities as described above. Interval placement of right-sided PICC line. Electronically Signed   By: Marijo Conception M.D.   On: 09/09/2021 08:11   DG CHEST PORT 1 VIEW  Result Date: 09/08/2021 CLINICAL DATA:  Shortness of breath. EXAM: PORTABLE CHEST 1 VIEW COMPARISON:  09/07/2021. FINDINGS: The heart is mildly enlarged and the mediastinal contours are within normal limits. Interstitial and airspace opacities are noted in the lungs bilaterally, increased from the prior exam. No effusion or pneumothorax. No acute osseous abnormality IMPRESSION: Interstitial and airspace opacities in the lungs bilaterally, increased from the prior exam. Electronically Signed   By: Brett Fairy M.D.   On: 09/08/2021 01:59   ECHOCARDIOGRAM COMPLETE  Result Date: 09/08/2021    ECHOCARDIOGRAM REPORT   Patient Name:   LOVEL SUAZO Date of Exam: 09/08/2021 Medical Rec #:  [id number]        Height:       70.0 in Accession #:    [id number]  Weight:       249.0 lb Date of Birth:  04-03-1956        BSA:          2.291 m Patient Age:    18 years         BP:           123/73 mmHg Patient Gender: M                HR:           75 bpm. Exam Location:  Inpatient Procedure: 2D Echo, Cardiac Doppler, Color Doppler and Intracardiac            Opacification Agent Indications:    NSTEMI  History:        Patient has prior history of Echocardiogram examinations, most                 recent 12/25/2020. CAD and Previous Myocardial Infarction; Risk                 Factors:Hypertension and Dyslipidemia.  Sonographer:    Clayton Lefort RDCS (AE) Referring Phys: [id number] Shela Leff  Sonographer Comments: On BiPap. IMPRESSIONS  1. Mild global hypokinesis with more severe hypokinesis in the inferolateral, anterolateral, and  apical myocardium. Left ventricular ejection fraction, by estimation, is 30 to 35%. The left ventricle has moderately decreased function. The left ventricle  demonstrates regional wall motion abnormalities (see scoring diagram/findings for description). Left ventricular diastolic parameters are consistent with Grade II diastolic dysfunction (pseudonormalization).  2. Right ventricular systolic function is mildly reduced. The right ventricular size is normal. There is normal pulmonary artery systolic pressure.  3. The mitral valve is normal in structure. Trivial mitral valve regurgitation. No evidence of mitral stenosis.  4. The aortic valve is tricuspid. Aortic valve regurgitation is not visualized. No aortic stenosis is present.  5. The inferior vena cava is dilated in size with >50% respiratory variability, suggesting right atrial pressure of 8 mmHg. FINDINGS  Left Ventricle: Mild global hypokinesis with more severe hypokinesis in the inferolateral, anterolateral, and apical myocardium. Left ventricular ejection fraction, by estimation, is 30 to 35%. The left ventricle has moderately decreased function. The left ventricle demonstrates regional wall motion abnormalities. The left ventricular internal cavity size was normal in size. There is no left ventricular hypertrophy. Left ventricular diastolic parameters are consistent with Grade II diastolic dysfunction (pseudonormalization). Right Ventricle: The right ventricular size is normal. No increase in right ventricular wall thickness. Right ventricular systolic function is mildly reduced. There is normal pulmonary artery systolic pressure. The tricuspid regurgitant velocity is 2.28 m/s, and with an assumed right atrial pressure of 8 mmHg, the estimated right ventricular systolic pressure is 99.3 mmHg. Left Atrium: Left atrial size was normal in size. Right Atrium: Right atrial size was normal in size. Pericardium: There is no evidence of pericardial effusion.  Mitral Valve: The mitral valve is normal in structure. Trivial mitral valve regurgitation. No evidence of mitral valve stenosis. MV peak gradient, 5.1 mmHg. The mean mitral valve gradient is 2.0 mmHg. Tricuspid Valve: The tricuspid valve is normal in structure. Tricuspid valve regurgitation is trivial. No evidence of tricuspid stenosis. Aortic Valve: The aortic valve is tricuspid. Aortic valve regurgitation is not visualized. No aortic stenosis is present. Aortic valve mean gradient measures 3.0 mmHg. Aortic valve peak gradient measures 4.7 mmHg. Aortic valve area, by VTI measures 2.07 cm. Pulmonic Valve: The pulmonic valve was normal in structure. Pulmonic valve regurgitation is trivial.  No evidence of pulmonic stenosis. Aorta: The aortic root is normal in size and structure. Venous: The inferior vena cava is dilated in size with greater than 50% respiratory variability, suggesting right atrial pressure of 8 mmHg. IAS/Shunts: No atrial level shunt detected by color flow Doppler.  LEFT VENTRICLE PLAX 2D LVIDd:         5.40 cm   Diastology LVIDs:         4.40 cm   LV e' medial:    4.13 cm/s LV PW:         1.00 cm   LV E/e' medial:  27.8 LV IVS:        0.90 cm   LV e' lateral:   8.85 cm/s LVOT diam:     2.00 cm   LV E/e' lateral: 13.0 LV SV:         48 LV SV Index:   21 LVOT Area:     3.14 cm  RIGHT VENTRICLE            IVC RV Basal diam:  3.90 cm    IVC diam: 2.10 cm RV Mid diam:    3.70 cm RV S prime:     9.15 cm/s TAPSE (M-mode): 1.8 cm LEFT ATRIUM             Index        RIGHT ATRIUM           Index LA diam:        3.80 cm 1.66 cm/m   RA Area:     17.00 cm LA Vol (A2C):   57.4 ml 25.05 ml/m  RA Volume:   47.10 ml  20.56 ml/m LA Vol (A4C):   53.0 ml 23.13 ml/m LA Biplane Vol: 58.6 ml 25.58 ml/m  AORTIC VALVE AV Area (Vmax):    2.16 cm AV Area (Vmean):   2.00 cm AV Area (VTI):     2.07 cm AV Vmax:           108.00 cm/s AV Vmean:          74.100 cm/s AV VTI:            0.231 m AV Peak Grad:      4.7 mmHg  AV Mean Grad:      3.0 mmHg LVOT Vmax:         74.30 cm/s LVOT Vmean:        47.200 cm/s LVOT VTI:          0.152 m LVOT/AV VTI ratio: 0.66  AORTA Ao Root diam: 3.00 cm Ao Asc diam:  2.90 cm MITRAL VALVE                TRICUSPID VALVE MV Area (PHT): 4.33 cm     TR Peak grad:   20.8 mmHg MV Area VTI:   1.52 cm     TR Vmax:        228.00 cm/s MV Peak grad:  5.1 mmHg MV Mean grad:  2.0 mmHg     SHUNTS MV Vmax:       1.13 m/s     Systemic VTI:  0.15 m MV Vmean:      59.5 cm/s    Systemic Diam: 2.00 cm MV Decel Time: 175 msec MV E velocity: 115.00 cm/s MV A velocity: 66.00 cm/s MV E/A ratio:  1.74 Skeet Latch MD Electronically signed by Skeet Latch MD Signature Date/Time: 09/08/2021/5:55:25 PM    Final    [place]  EKG SITE RITE  Result Date: 09/08/2021 If [facility] image not attached, placement could not be confirmed due to current cardiac rhythm.   Cardiac Studies   Echo 09/08/21: 1. Mild global hypokinesis with more severe hypokinesis in the  inferolateral, anterolateral, and apical myocardium. Left ventricular  ejection fraction, by estimation, is 30 to 35%. The left ventricle has  moderately decreased function. The left ventricle   demonstrates regional wall motion abnormalities (see scoring  diagram/findings for description). Left ventricular diastolic parameters  are consistent with Grade II diastolic dysfunction (pseudonormalization).   2. Right ventricular systolic function is mildly reduced. The right  ventricular size is normal. There is normal pulmonary artery systolic  pressure.   3. The mitral valve is normal in structure. Trivial mitral valve  regurgitation. No evidence of mitral stenosis.   4. The aortic valve is tricuspid. Aortic valve regurgitation is not  visualized. No aortic stenosis is present.   5. The inferior vena cava is dilated in size with >50% respiratory  variability, suggesting right atrial pressure of 8 mmHg.   Patient Profile     Mr. Keefe is a 35M with CAD  status post OM1 PCI, chronic diastolic heart failure, COPD, hypertension, hyperlipidemia, and diabetes admitted with acute on chronic diastolic heart failure and NSTEMI.   Assessment & Plan    # Hypoxic respiratory failure: # Acute systolic and diastolic heart failure: # CAP: # COPD exacerbation: # Hypertension:  Hypoxia requiring BiPAP yesterday.  He is net -1.2L and remains volume overloaded.  Diuresis is somewhat slow.  We will increase his Lasix to 3 times daily.  Check a BMP.  This morning he is on high flow oxygen and being treated with IV antibiotics for pneumonia.  BNP improving but still markedly elevated.  Echo showed acute systolic dysfunction with focal wall motion abnormality.  He will need an ischemic evaluation once more stable.  Given his diabetes, heart failure, and renal dysfunction, would consider Farxiga prior to discharge.  Given his systolic dysfunction we need to stop amlodipine.  Increase hydralazine to 100 mg every 8 hours and add Imdur 60 mg daily.  Stop metoprolol and switch to carvedilol.  He has an allergy of redness and  calf swelling.  Will assess and monitor closely while in the hospital.   # NSTEMI: # Status post PCI, moderate CAD: # Hyperlipidemia:  His current presentation is consistent with NSTEMI and ischemia.  I am concerned that he has flash pulmonary edema in the setting of acute ischemia.  Echo revealed reduced systolic function with focal wall motion abnormality suspicious for obstructive coronary disease.  Right now he has worsened anemia as well as AKI.  Cannot plan for an invasive work-up until his pneumonia is resolved, volume status has improved, renal function has stabilized, and blood loss source is identified.  Switching to carvedilol as below.  Continue atorvastatin.   # Anemia:  Significant hemoglobin drop after starting heparin.  We will stop heparin and add a PPI.  He will need a GI consult before invasive angiography.  Total critical care  time: 50 minutes. Critical care time was exclusive of separately billable procedures and treating other patients. Critical care was necessary to treat or prevent imminent or life-threatening deterioration. Critical care was time spent personally by me on the following activities: development of treatment plan with patient and/or surrogate as well as nursing, discussions with consultants, evaluation of patient's response to treatment, examination of patient, obtaining history from patient or surrogate, ordering  and performing treatments and interventions, ordering and review of laboratory studies, ordering and review of radiographic studies, pulse oximetry and re-evaluation of patient's condition.     For questions or updates, please contact [place] Please consult [email or web address] for contact info under        Signed, Skeet Latch, MD  09/09/2021, 11:26 AM

## 2021-09-09 NOTE — Progress Notes (Signed)
Orthopedic Tech Progress Note Patient Details:  Derek Lowery 27-Mar-1956 [id number]  Ortho Devices Type of Ortho Device: Haematologist Ortho Device/Splint Location: BLE Ortho Device/Splint Interventions: Ordered, Adjustment, Application   Post Interventions Patient Tolerated: Well Instructions Provided: Care of device  [name] 09/09/2021, 1:14 PM

## 2021-09-09 NOTE — Progress Notes (Addendum)
NAME:  Derek Lowery, MRN:  [id number], DOB:  July 29, 1956, LOS: 2 ADMISSION DATE:  09/07/2021, CONSULTATION DATE: 09/08/2021 REFERRING MD: Reggy Eye MD , CHIEF COMPLAINT: Pneumonia, non-ST relation MI  History of Present Illness:   66 year old with history of coronary artery disease, HFpEF, hypertension, diabetes, hyperlipidemia, glaucoma with legal blindness presenting with cough, chest pain.  Found to have multifocal pneumonia with non-ST elevation MI.  Started on Rocephin, azithromycin, heparin drip.  PCCM consulted on 1/22 for worsening dyspnea, hypoxia  Pertinent  Medical History    has a past medical history of Arthritis, CAD (coronary artery disease), Cataract, Essential hypertension, benign, Glaucoma, Hypercholesteremia, Internal hemorrhoids, Noncompliance, and Type 2 diabetes mellitus ([place]) (1990).   Significant Hospital Events: Including procedures, antibiotic start and stop dates in addition to other pertinent events   1/21- Admit  1/21 CT chest > CT chest with patchy areas of consolidation, groundglass opacity and pulmonary edema  Interim History / Subjective:   Off bipap this morning but still having desaturations into 80's without increased WOB, on salter HFNC  No complaints of pain or SOB, overall fells better  Remains on lasix gtt 35m/hr UOP 1.8L/ net -1.1L  Hgb down to 7.4 CVP 21  Objective   Blood pressure 123/66, pulse 87, temperature 98.1 F (36.7 C), temperature source Oral, resp. rate 14, height 5' 10"  (1.778 m), weight 120.5 kg, SpO2 92 %. CVP:  [15 mmHg-23 mmHg] 21 mmHg  FiO2 (%):  [60 %-100 %] 80 %   Intake/Output Summary (Last 24 hours) at 09/09/2021 1129 Last data filed at 09/09/2021 08177Gross per 24 hour  Intake 430.06 ml  Output 1820 ml  Net -1389.94 ml   Filed Weights   09/07/21 1034 09/09/21 0420  Weight: 112.9 kg 120.5 kg   Examination: General:  Older appearing male sitting upright tin bed in NAD HEENT: MM pink/moist, R pupil cataract,  left 3/reactive Neuro:  alert, oriented, MAE CV: rr, occasional ectopy PULM:  non labored, scattered rhonchi/ rales, no wheezes, not really getting up much sputum  GI: soft, obese, +bs, foley  Extremities: warm/dry, +1 LE edema, RUE PICC  Labs/imaging reviewed  BUN/creatinine 43/2.52-> 51/ 2.75 BNP 1022 > 618 Hemoglobin 9.5 > 8.9> 7.4  TTE 1/22 > EF 30-35%, mild global HK with more severe HK in inferolateral, anterolateral, and apical myocardium, G2DD, mildly reduced RV, normal PASP, est RA pressure of 8  (TTE 5/22 with EF 60-65%, no RWA, G2DD, nromal RV)  Resolved Hospital Problem list     Assessment & Plan:  Acute hypoxic respiratory failure secondary to multilobar pneumonia and pulmonary edema - continue BIPAP prn and [facility]/ O2 titration - diuresis as below - continue CAP coverage for now.  PCT 5.65 - follow sputum cx, u. Strep neg, legionella pending  - remains at risk for intubation - adding IS/ flutter and ongoing pulm hygiene  - prn albuterol   - if stable off BiPAP can consider starting clear liquids vs ?consider cortrak for nutritional needs    Non-ST elevation MI Acute systolic on chronic diastolic heart failure Hypertension HLD - Cards following, appreciate input  - discussed with cards, holding heparin for now given drop - echo as above, new systolic HF and RWA, will likely need LHC when able given renal function and respiratory status  - remains hemodynamically stable, goal MAP > 65 - trend CVP via PICC  - continue lasix gtt per cards, s/p metolazone 1/22 - strict I/Os, daily  wts - keep K>4, Mag > 2 - continue ASA, Lipitor, hydralazine 66m TID-> 1032mTID, imdur 6036maily.  Lopressor changing to coreg and stopping norvasc with systolic dysfunction per cards.  Management per cardiology  AKI on CKD3 - sCr up slightly today  - continue diuresis as above monitoring closely - strict I/Os - trend renal function, renal dose meds,  avoid nephrotoxins   Diabetes - mostly NPO while on BiPAP  - reduce SSI to sensitive, stop meal coverage and reduce semglee by half q HS given lower CBGs in the 70's to prevent hypoglycemic   Acute on chronic normocytic anemia - Hgb drop 9.5 > 8.9> 7.4 while on heparin which is being held today - trend H/H q 6  - no obvious source of bleeding, closely monitor  - send stool for hemoccult, remains on empiric PPI  - transfuse for Hgb < 7 or may need to consider 8 given NSTEMI and new HF   Best Practice (right click and "Reselect all SmartList Selections" daily)   Diet/type: NPO DVT prophylaxis: systemic heparin- holding 1/23 GI prophylaxis: PPI Lines: N/A Foley:  N/A Code Status:  full code Last date of multidisciplinary goals of care discussion []  pending.  Patient updated on plan of care.  No family at bedside.   Labs   CBC: Recent Labs  Lab 09/07/21 1030 09/08/21 0426 09/08/21 0556 09/08/21 1205 09/08/21 1254 09/09/21 0405  WBC 6.6  --  9.8  --   --  9.5  NEUTROABS 5.2  --   --   --   --   --   HGB 9.6* 9.5* 8.9* 9.9* 9.5* 7.4*  HCT 29.0* 28.0* 27.1* 29.0* 28.0* 23.3*  MCV 86.3  --  88.3  --   --  88.6  PLT 130*  --  156  --   --  134*    Basic Metabolic Panel: Recent Labs  Lab 09/07/21 1030 09/08/21 0217 09/08/21 0426 09/08/21 1205 09/08/21 1254 09/08/21 1403 09/09/21 0405  NA 139 137 139 138 138 134*  --   K 3.5 4.0 4.1 3.9 4.0 4.0  --   CL 102 100  --   --   --  102  --   CO2 27 27  --   --   --  24  --   GLUCOSE 165* 132*  --   --   --  137*  --   BUN 37* 43*  --   --   --  51*  --   CREATININE 2.22* 2.52*  --   --   --  2.75*  --   CALCIUM 8.1* 7.9*  --   --   --  7.4*  --   MG  --   --   --   --   --   --  2.3  PHOS  --   --   --   --   --   --  4.4   GFR: Estimated Creatinine Clearance: 35.8 mL/min (A) (by C-G formula based on SCr of 2.75 mg/dL (H)). Recent Labs  Lab 09/07/21 1030 09/08/21 0217 09/08/21 0556 09/09/21 0405  PROCALCITON  --   0.30  --  5.65  WBC 6.6  --  9.8 9.5    Liver Function Tests: Recent Labs  Lab 09/07/21 1030  AST 33  ALT 20  ALKPHOS 67  BILITOT 0.5  PROT 6.4*  ALBUMIN 3.2*   Recent Labs  Lab 09/07/21 1030  LIPASE 51  No results for input(s): AMMONIA in the last 168 hours.  ABG    Component Value Date/Time   PHART 7.395 09/08/2021 1254   PCO2ART 45.6 09/08/2021 1254   PO2ART 54 (L) 09/08/2021 1254   HCO3 27.9 09/08/2021 1254   TCO2 29 09/08/2021 1254   ACIDBASEDEF 0.3 10/05/2015 1419   O2SAT 48.4 09/08/2021 1934     Coagulation Profile: No results for input(s): INR, PROTIME in the last 168 hours.  Cardiac Enzymes: No results for input(s): CKTOTAL, CKMB, CKMBINDEX, TROPONINI in the last 168 hours.  HbA1C: HbA1c, POC (controlled diabetic range)  Date/Time Value Ref Range Status  06/17/2021 10:41 AM 7.1 (A) 0.0 - 7.0 % Final  11/16/2020 04:42 PM 8.8 (A) 0.0 - 7.0 % Final    CBG: Recent Labs  Lab 09/08/21 1309 09/08/21 1556 09/08/21 2142 09/09/21 0354 09/09/21 0802  GLUCAP 141* 112* 89 81 73     Critical care time: 35 mins      Kennieth Rad, ACNP [facility] 09/09/2021, 11:29 AM  See Amion for pager If no response to pager, please call PCCM consult pager After 7:00 pm call Elink

## 2021-09-10 ENCOUNTER — Inpatient Hospital Stay (HOSPITAL_COMMUNITY): Payer: 59

## 2021-09-10 DIAGNOSIS — I25118 Atherosclerotic heart disease of native coronary artery with other forms of angina pectoris: Secondary | ICD-10-CM | POA: Diagnosis not present

## 2021-09-10 DIAGNOSIS — J9601 Acute respiratory failure with hypoxia: Secondary | ICD-10-CM | POA: Diagnosis not present

## 2021-09-10 DIAGNOSIS — I10 Essential (primary) hypertension: Secondary | ICD-10-CM

## 2021-09-10 DIAGNOSIS — D649 Anemia, unspecified: Secondary | ICD-10-CM | POA: Diagnosis not present

## 2021-09-10 DIAGNOSIS — N179 Acute kidney failure, unspecified: Secondary | ICD-10-CM | POA: Diagnosis not present

## 2021-09-10 DIAGNOSIS — I5033 Acute on chronic diastolic (congestive) heart failure: Secondary | ICD-10-CM | POA: Diagnosis not present

## 2021-09-10 LAB — BASIC METABOLIC PANEL
Anion gap: 11 (ref 5–15)
BUN: 63 mg/dL — ABNORMAL HIGH (ref 8–23)
CO2: 28 mmol/L (ref 22–32)
Calcium: 7.7 mg/dL — ABNORMAL LOW (ref 8.9–10.3)
Chloride: 99 mmol/L (ref 98–111)
Creatinine, Ser: 3.32 mg/dL — ABNORMAL HIGH (ref 0.61–1.24)
GFR, Estimated: 20 mL/min — ABNORMAL LOW (ref >60)
Glucose, Bld: 102 mg/dL — ABNORMAL HIGH (ref 70–99)
Potassium: 3.8 mmol/L (ref 3.5–5.1)
Sodium: 138 mmol/L (ref 135–145)

## 2021-09-10 LAB — GLUCOSE, CAPILLARY
Glucose-Capillary: 102 mg/dL — ABNORMAL HIGH (ref 70–99)
Glucose-Capillary: 212 mg/dL — ABNORMAL HIGH (ref 70–99)
Glucose-Capillary: 82 mg/dL (ref 70–99)
Glucose-Capillary: 83 mg/dL (ref 70–99)
Glucose-Capillary: 88 mg/dL (ref 70–99)
Glucose-Capillary: 98 mg/dL (ref 70–99)

## 2021-09-10 LAB — CBC
HCT: 25.3 % — ABNORMAL LOW (ref 39.0–52.0)
Hemoglobin: 8.3 g/dL — ABNORMAL LOW (ref 13.0–17.0)
MCH: 28.3 pg (ref 26.0–34.0)
MCHC: 32.8 g/dL (ref 30.0–36.0)
MCV: 86.3 fL (ref 80.0–100.0)
Platelets: 157 10*3/uL (ref 150–400)
RBC: 2.93 MIL/uL — ABNORMAL LOW (ref 4.22–5.81)
RDW: 14.5 % (ref 11.5–15.5)
WBC: 9.2 10*3/uL (ref 4.0–10.5)
nRBC: 0 % (ref 0.0–0.2)

## 2021-09-10 LAB — COOXEMETRY PANEL
Carboxyhemoglobin: 1.5 % (ref 0.5–1.5)
Methemoglobin: 1.2 % (ref 0.0–1.5)
O2 Saturation: 59.8 %
Total hemoglobin: 7.5 g/dL — ABNORMAL LOW (ref 12.0–16.0)

## 2021-09-10 LAB — CULTURE, RESPIRATORY W GRAM STAIN: Culture: NORMAL

## 2021-09-10 LAB — HEMOGLOBIN AND HEMATOCRIT, BLOOD
HCT: 22.3 % — ABNORMAL LOW (ref 39.0–52.0)
Hemoglobin: 7.4 g/dL — ABNORMAL LOW (ref 13.0–17.0)

## 2021-09-10 LAB — HEPARIN LEVEL (UNFRACTIONATED): Heparin Unfractionated: <0.1 IU/mL — ABNORMAL LOW (ref 0.30–0.70)

## 2021-09-10 LAB — LEGIONELLA PNEUMOPHILA SEROGP 1 UR AG: L. pneumophila Serogp 1 Ur Ag: NEGATIVE

## 2021-09-10 LAB — MAGNESIUM: Magnesium: 2.6 mg/dL — ABNORMAL HIGH (ref 1.7–2.4)

## 2021-09-10 MED ORDER — IPRATROPIUM-ALBUTEROL 0.5-2.5 (3) MG/3ML IN SOLN: 3.0000 mL | Freq: Once | RESPIRATORY_TRACT | Status: AC

## 2021-09-10 MED ORDER — METOLAZONE 5 MG PO TABS
5.0000 mg | ORAL_TABLET | Freq: Once | ORAL | Status: AC
  Administered 2021-09-10: 10:00:00 5 mg via ORAL
  Filled 2021-09-10: qty 1

## 2021-09-10 MED ORDER — HEPARIN (PORCINE) 25000 UT/250ML-% IV SOLN
1450.0000 [IU]/h | INTRAVENOUS | Status: DC
  Administered 2021-09-10: 15:00:00 1300 [IU]/h via INTRAVENOUS
  Administered 2021-09-11 – 2021-09-12 (×2): 1450 [IU]/h via INTRAVENOUS
  Filled 2021-09-10 (×3): qty 250

## 2021-09-10 MED ORDER — ALBUTEROL SULFATE (2.5 MG/3ML) 0.083% IN NEBU
2.5000 mg | INHALATION_SOLUTION | RESPIRATORY_TRACT | Status: DC
  Administered 2021-09-10 – 2021-09-11 (×6): 2.5 mg via RESPIRATORY_TRACT
  Filled 2021-09-10 (×6): qty 3

## 2021-09-10 MED ORDER — ALBUMIN HUMAN 25 % IV SOLN
25.0000 g | Freq: Once | INTRAVENOUS | Status: AC
  Administered 2021-09-10: 10:00:00 25 g via INTRAVENOUS
  Filled 2021-09-10: qty 100

## 2021-09-10 MED ORDER — IPRATROPIUM-ALBUTEROL 0.5-2.5 (3) MG/3ML IN SOLN
RESPIRATORY_TRACT | Status: AC
  Administered 2021-09-10: 09:00:00 3 mL via RESPIRATORY_TRACT
  Filled 2021-09-10: qty 3

## 2021-09-10 NOTE — Progress Notes (Signed)
Progress Note  Patient Name: Derek Lowery Date of Encounter: 09/10/2021  [facility] Cardiologist: Skeet Latch, MD   Subjective   Still short of breath.  No significant improvement from yesterday.  Reports nausea.  Inpatient Medications    Scheduled Meds:  aspirin EC  81 mg Oral Daily   atorvastatin  40 mg Oral Daily   azithromycin  500 mg Oral Daily   brimonidine  1 drop Right Eye BID   carvedilol  12.5 mg Oral BID WC   Chlorhexidine Gluconate Cloth  6 each Topical Daily   dorzolamide  1 drop Right Eye BID   guaiFENesin  600 mg Oral BID   hydrALAZINE  100 mg Oral TID   insulin aspart  0-9 Units Subcutaneous Q4H   isosorbide mononitrate  60 mg Oral Daily   Netarsudil Dimesylate  1 drop Right Eye QHS   pantoprazole  40 mg Oral BID   senna  1 tablet Oral BID   sodium chloride flush  10-40 mL Intracatheter Q12H   timolol  1 drop Right Eye BID   Continuous Infusions:  sodium chloride Stopped (09/08/21 1437)   cefTRIAXone (ROCEPHIN)  IV Stopped (09/09/21 1433)   furosemide (LASIX) 200 mg in dextrose 5% 100 mL (4m/mL) infusion 10 mg/hr (09/10/21 0500)   PRN Meds: sodium chloride, acetaminophen **OR** acetaminophen, albuterol, ammonium lactate, docusate sodium, ondansetron (ZOFRAN) IV, polyethylene glycol, sodium chloride flush   Vital Signs    Vitals:   09/10/21 0500 09/10/21 0544 09/10/21 0600 09/10/21 0755  BP: 135/69  (!) 141/77   Pulse: 74  80 68  Resp: 14  20   Temp:      TempSrc:      SpO2: 90% 97% 95% 97%  Weight: 119 kg     Height:        Intake/Output Summary (Last 24 hours) at 09/10/2021 0809 Last data filed at 09/10/2021 0500 Gross per 24 hour  Intake 452.82 ml  Output 1545 ml  Net -1092.18 ml   Last 3 Weights 09/10/2021 09/09/2021 09/07/2021  Weight (lbs) 262 lb 5.6 oz 265 lb 10.5 oz 249 lb  Weight (kg) 119 kg 120.5 kg 112.946 kg      Telemetry    Sinus rhythm.  PVCs.- Personally Reviewed  ECG    Sinus rhythm.  Rate 94 bpm.   Right bundle branch block.  Left posterior fascicular block. - Personally Reviewed  Physical Exam   VS:  BP (!) 141/77    Pulse 68    Temp 98 F (36.7 C) (Oral)    Resp 20    Ht 5' 10"  (1.778 m)    Wt 119 kg    SpO2 97%    BMI 37.64 kg/m  , BMI Body mass index is 37.64 kg/m. GENERAL: Critically ill-appearing.  Mild respiratory distress on high flow oxygen. HEENT: Pupils equal round and reactive, fundi not visualized, oral mucosa unremarkable NECK: + jugular venous distention, waveform within normal limits, carotid upstroke brisk and symmetric, no bruits, no thyromegaly LUNGS: Coarse breath with rhonchi and wheezing bilaterally. HEART:  RRR.  PMI not displaced or sustained,S1 and S2 within normal limits, no S3, no S4, no clicks, no rubs, no murmurs ABD:  Flat, positive bowel sounds normal in frequency in pitch, no bruits, no rebound, no guarding, no midline pulsatile mass, no hepatomegaly, no splenomegaly EXT:  2 plus pulses throughout, 2 +woody lower extremity edema edema bilaterally.  Unna boots bilaterally, no cyanosis no clubbing SKIN:  No  rashes no nodules NEURO:  Cranial nerves II through XII grossly intact, motor grossly intact throughout [facility]:  Cognitively intact, oriented to person place and time   Labs    High Sensitivity Troponin:   Recent Labs  Lab 09/07/21 1255 09/07/21 1757 09/07/21 2110 09/08/21 0217 09/08/21 0556  TROPONINIHS 141* 333* 714* 2,512* 4,145*     Chemistry Recent Labs  Lab 09/07/21 1030 09/08/21 0217 09/08/21 1403 09/09/21 0405 09/09/21 1138 09/10/21 0346  NA 139   < > 134*  --  139 138  K 3.5   < > 4.0  --  3.4* 3.8  CL 102   < > 102  --  100 99  CO2 27   < > 24  --  28 28  GLUCOSE 165*   < > 137*  --  92 102*  BUN 37*   < > 51*  --  61* 63*  CREATININE 2.22*   < > 2.75*  --  3.15* 3.32*  CALCIUM 8.1*   < > 7.4*  --  7.9* 7.7*  MG  --   --   --  2.3  --  2.6*  PROT 6.4*  --   --   --   --   --   ALBUMIN 3.2*  --   --   --   --   --    AST 33  --   --   --   --   --   ALT 20  --   --   --   --   --   ALKPHOS 67  --   --   --   --   --   BILITOT 0.5  --   --   --   --   --   GFRNONAA 32*   < > 25*  --  21* 20*  ANIONGAP 10   < > 8  --  11 11   < > = values in this interval not displayed.    Lipids  Recent Labs  Lab 09/08/21 0500  CHOL 95  TRIG 38  HDL 33*  LDLCALC 54  CHOLHDL 2.9    Hematology Recent Labs  Lab 09/08/21 0556 09/08/21 1205 09/09/21 0405 09/09/21 1346 09/09/21 2113 09/10/21 0346  WBC 9.8  --  9.5  --   --  9.2  RBC 3.07*  --  2.63*  --   --  2.93*  HGB 8.9*   < > 7.4* 8.8* 7.3* 8.3*  HCT 27.1*   < > 23.3* 26.7* 22.5* 25.3*  MCV 88.3  --  88.6  --   --  86.3  MCH 29.0  --  28.1  --   --  28.3  MCHC 32.8  --  31.8  --   --  32.8  RDW 14.5  --  14.6  --   --  14.5  PLT 156  --  134*  --   --  157   < > = values in this interval not displayed.   Thyroid No results for input(s): TSH, FREET4 in the last 168 hours.  BNP Recent Labs  Lab 09/08/21 0500 09/08/21 1403 09/09/21 0405  BNP 1,022.5* 1,081.8* 618.3*    DDimer No results for input(s): DDIMER in the last 168 hours.   Radiology    DG CHEST PORT 1 VIEW  Result Date: 09/09/2021 CLINICAL DATA:  Respiratory complication. EXAM: PORTABLE CHEST 1 VIEW COMPARISON:  September 08, 2021. FINDINGS: Stable cardiomegaly.  Stable bilateral lung opacities are noted, left greater than right, consistent with pneumonia and possible edema is well. Interval placement of right-sided PICC line with distal tip in expected position of the SVC. Bony thorax is unremarkable. IMPRESSION: Stable bilateral lung opacities as described above. Interval placement of right-sided PICC line. Electronically Signed   By: Marijo Conception M.D.   On: 09/09/2021 08:11   ECHOCARDIOGRAM COMPLETE  Result Date: 09/08/2021    ECHOCARDIOGRAM REPORT   Patient Name:   Derek Lowery Date of Exam: 09/08/2021 Medical Rec #:  [id number]        Height:       70.0 in Accession #:     [id number]       Weight:       249.0 lb Date of Birth:  Jun 01, 1956        BSA:          2.291 m Patient Age:    80 years         BP:           123/73 mmHg Patient Gender: M                HR:           75 bpm. Exam Location:  Inpatient Procedure: 2D Echo, Cardiac Doppler, Color Doppler and Intracardiac            Opacification Agent Indications:    NSTEMI  History:        Patient has prior history of Echocardiogram examinations, most                 recent 12/25/2020. CAD and Previous Myocardial Infarction; Risk                 Factors:Hypertension and Dyslipidemia.  Sonographer:    Clayton Lefort RDCS (AE) Referring Phys: [id number] Shela Leff  Sonographer Comments: On BiPap. IMPRESSIONS  1. Mild global hypokinesis with more severe hypokinesis in the inferolateral, anterolateral, and apical myocardium. Left ventricular ejection fraction, by estimation, is 30 to 35%. The left ventricle has moderately decreased function. The left ventricle  demonstrates regional wall motion abnormalities (see scoring diagram/findings for description). Left ventricular diastolic parameters are consistent with Grade II diastolic dysfunction (pseudonormalization).  2. Right ventricular systolic function is mildly reduced. The right ventricular size is normal. There is normal pulmonary artery systolic pressure.  3. The mitral valve is normal in structure. Trivial mitral valve regurgitation. No evidence of mitral stenosis.  4. The aortic valve is tricuspid. Aortic valve regurgitation is not visualized. No aortic stenosis is present.  5. The inferior vena cava is dilated in size with >50% respiratory variability, suggesting right atrial pressure of 8 mmHg. FINDINGS  Left Ventricle: Mild global hypokinesis with more severe hypokinesis in the inferolateral, anterolateral, and apical myocardium. Left ventricular ejection fraction, by estimation, is 30 to 35%. The left ventricle has moderately decreased function. The left ventricle demonstrates  regional wall motion abnormalities. The left ventricular internal cavity size was normal in size. There is no left ventricular hypertrophy. Left ventricular diastolic parameters are consistent with Grade II diastolic dysfunction (pseudonormalization). Right Ventricle: The right ventricular size is normal. No increase in right ventricular wall thickness. Right ventricular systolic function is mildly reduced. There is normal pulmonary artery systolic pressure. The tricuspid regurgitant velocity is 2.28 m/s, and with an assumed right atrial pressure of 8 mmHg, the estimated right ventricular systolic pressure is 27.5 mmHg. Left Atrium: Left atrial size was  normal in size. Right Atrium: Right atrial size was normal in size. Pericardium: There is no evidence of pericardial effusion. Mitral Valve: The mitral valve is normal in structure. Trivial mitral valve regurgitation. No evidence of mitral valve stenosis. MV peak gradient, 5.1 mmHg. The mean mitral valve gradient is 2.0 mmHg. Tricuspid Valve: The tricuspid valve is normal in structure. Tricuspid valve regurgitation is trivial. No evidence of tricuspid stenosis. Aortic Valve: The aortic valve is tricuspid. Aortic valve regurgitation is not visualized. No aortic stenosis is present. Aortic valve mean gradient measures 3.0 mmHg. Aortic valve peak gradient measures 4.7 mmHg. Aortic valve area, by VTI measures 2.07 cm. Pulmonic Valve: The pulmonic valve was normal in structure. Pulmonic valve regurgitation is trivial. No evidence of pulmonic stenosis. Aorta: The aortic root is normal in size and structure. Venous: The inferior vena cava is dilated in size with greater than 50% respiratory variability, suggesting right atrial pressure of 8 mmHg. IAS/Shunts: No atrial level shunt detected by color flow Doppler.  LEFT VENTRICLE PLAX 2D LVIDd:         5.40 cm   Diastology LVIDs:         4.40 cm   LV e' medial:    4.13 cm/s LV PW:         1.00 cm   LV E/e' medial:  27.8 LV  IVS:        0.90 cm   LV e' lateral:   8.85 cm/s LVOT diam:     2.00 cm   LV E/e' lateral: 13.0 LV SV:         48 LV SV Index:   21 LVOT Area:     3.14 cm  RIGHT VENTRICLE            IVC RV Basal diam:  3.90 cm    IVC diam: 2.10 cm RV Mid diam:    3.70 cm RV S prime:     9.15 cm/s TAPSE (M-mode): 1.8 cm LEFT ATRIUM             Index        RIGHT ATRIUM           Index LA diam:        3.80 cm 1.66 cm/m   RA Area:     17.00 cm LA Vol (A2C):   57.4 ml 25.05 ml/m  RA Volume:   47.10 ml  20.56 ml/m LA Vol (A4C):   53.0 ml 23.13 ml/m LA Biplane Vol: 58.6 ml 25.58 ml/m  AORTIC VALVE AV Area (Vmax):    2.16 cm AV Area (Vmean):   2.00 cm AV Area (VTI):     2.07 cm AV Vmax:           108.00 cm/s AV Vmean:          74.100 cm/s AV VTI:            0.231 m AV Peak Grad:      4.7 mmHg AV Mean Grad:      3.0 mmHg LVOT Vmax:         74.30 cm/s LVOT Vmean:        47.200 cm/s LVOT VTI:          0.152 m LVOT/AV VTI ratio: 0.66  AORTA Ao Root diam: 3.00 cm Ao Asc diam:  2.90 cm MITRAL VALVE                TRICUSPID VALVE MV Area (PHT): 4.33 cm  TR Peak grad:   20.8 mmHg MV Area VTI:   1.52 cm     TR Vmax:        228.00 cm/s MV Peak grad:  5.1 mmHg MV Mean grad:  2.0 mmHg     SHUNTS MV Vmax:       1.13 m/s     Systemic VTI:  0.15 m MV Vmean:      59.5 cm/s    Systemic Diam: 2.00 cm MV Decel Time: 175 msec MV E velocity: 115.00 cm/s MV A velocity: 66.00 cm/s MV E/A ratio:  1.74 Skeet Latch MD Electronically signed by Skeet Latch MD Signature Date/Time: 09/08/2021/5:55:25 PM    Final    [place] EKG SITE RITE  Result Date: 09/08/2021 If Site Rite image not attached, placement could not be confirmed due to current cardiac rhythm.   Cardiac Studies   Echo 09/08/21: 1. Mild global hypokinesis with more severe hypokinesis in the  inferolateral, anterolateral, and apical myocardium. Left ventricular  ejection fraction, by estimation, is 30 to 35%. The left ventricle has  moderately decreased function. The left  ventricle   demonstrates regional wall motion abnormalities (see scoring  diagram/findings for description). Left ventricular diastolic parameters  are consistent with Grade II diastolic dysfunction (pseudonormalization).   2. Right ventricular systolic function is mildly reduced. The right  ventricular size is normal. There is normal pulmonary artery systolic  pressure.   3. The mitral valve is normal in structure. Trivial mitral valve  regurgitation. No evidence of mitral stenosis.   4. The aortic valve is tricuspid. Aortic valve regurgitation is not  visualized. No aortic stenosis is present.   5. The inferior vena cava is dilated in size with >50% respiratory  variability, suggesting right atrial pressure of 8 mmHg.   Patient Profile     Mr. Schellinger is a 3M with CAD status post OM1 PCI, chronic diastolic heart failure, COPD, hypertension, hyperlipidemia, and diabetes admitted with acute on chronic diastolic heart failure and NSTEMI.   Assessment & Plan    # Hypoxic respiratory failure: # Acute systolic and diastolic heart failure: # CAP: # COPD exacerbation: # Hypertension:  Hypoxia requiring BiPAP yesterday.  He is now on high flow nasal cannula.  He was net -1.3L yesterday.  He is -2.3 L this admission.  However renal function is worsening.  He is currently still volume overloaded.  CVP was 18.  Co-ox was low at 59.8 indicating likely cardiogenic.  However BP is elevated in the 140s/70s.  He is currently on a lasix drip.  May need to involve nephrology if renal function continues to worsen.  IV antibiotics for CAP.  BNP improving but still markedly elevated.  Echo showed acute systolic dysfunction with focal wall motion abnormality.  He will need an ischemic evaluation once more stable.  Given his diabetes, heart failure, and renal dysfunction, would consider Farxiga prior to discharge.  Given his new systolic dysfunction amlodipine was discontinued.  Increased hydralazine to 100 mg  every 8 hours and added Imdur 60 mg daily.  Switched metoprolol to carvedilol.  He has an allergy of redness and  calf swelling.  Will assess and monitor closely while in the hospital.  BP has been reasonable.   # NSTEMI: # Status post PCI, moderate CAD: # Hyperlipidemia:  His current presentation is consistent with NSTEMI and ischemia.  Echo revealed reduced systolic function with focal wall motion abnormality suspicious for obstructive coronary disease.  Right now he has worsened anemia  as well as AKI.  Cannot plan for an invasive work-up until his pneumonia is resolved, volume status has improved, renal function has stabilized, and blood loss source is identified.  Switched to carvedilol as below.  Continue atorvastatin.  Medical management for now.  Added Imdur.    # Anemia:  Significant hemoglobin drop after starting heparin.  Stopped heparin and added a PPI.  He will need a GI consult before invasive angiography.  Total critical care time:45 minutes. Critical care time was exclusive of separately billable procedures and treating other patients. Critical care was necessary to treat or prevent imminent or life-threatening deterioration. Critical care was time spent personally by me on the following activities: development of treatment plan with patient and/or surrogate as well as nursing, discussions with consultants, evaluation of patient's response to treatment, examination of patient, obtaining history from patient or surrogate, ordering and performing treatments and interventions, ordering and review of laboratory studies, ordering and review of radiographic studies, pulse oximetry and re-evaluation of patient's condition.     For questions or updates, please contact [facility] Please consult [email or web address] for contact info under        Signed, Skeet Latch, MD  09/10/2021, 8:09 AM

## 2021-09-10 NOTE — Progress Notes (Signed)
ANTICOAGULATION CONSULT NOTE  Pharmacy Consult for heparin Indication: chest pain/ACS  Allergies  Allergen Reactions   Coreg [Carvedilol] Other (See Comments)    Redness and swelling of calf    Patient Measurements: Height: 5' 10"  (177.8 cm) Weight: 119 kg (262 lb 5.6 oz) IBW/kg (Calculated) : 73 Heparin Dosing Weight: 97.8 kg   Vital Signs: Temp: 97.9 F (36.6 C) (01/24 1126) Temp Source: Oral (01/24 0400) BP: 145/66 (01/24 1300) Pulse Rate: 76 (01/24 1300)  Labs: Recent Labs     0000 09/07/21 2110 09/08/21 0217 09/08/21 0426 09/08/21 0556 09/08/21 1205 09/08/21 1403 09/09/21 0405 09/09/21 0553 09/09/21 1138 09/09/21 1346 09/09/21 2113 09/10/21 0346  HGB  --   --   --    < > 8.9*   < >  --  7.4*  --   --  8.8* 7.3* 8.3*  HCT  --   --   --    < > 27.1*   < >  --  23.3*  --   --  26.7* 22.5* 25.3*  PLT  --   --   --   --  156  --   --  134*  --   --   --   --  157  HEPARINUNFRC  --   --   --   --   --   --  0.44 >1.10* 0.43  --   --   --   --   CREATININE   < >  --  2.52*  --   --   --  2.75*  --   --  3.15*  --   --  3.32*  TROPONINIHS  --  714* 2,512*  --  4,145*  --   --   --   --   --   --   --   --    < > = values in this interval not displayed.     Estimated Creatinine Clearance: 29.4 mL/min (A) (by C-G formula based on SCr of 3.32 mg/dL (H)).   Medical History: Past Medical History:  Diagnosis Date   Arthritis    CAD (coronary artery disease)    NSTEMI 06/2011:  LHC 07/18/11: Proximal diagonal 60%, distal LAD with a diabetic appearance and 60% stenosis, OM2 with an occluded superior branch and an inferior branch with 90%, EF 55% with inferior hypokinesis.  PCI: Promus DES to the OM2 inferior branch.  This vessel provides collaterals to the superior branch which remained occluded.  Echocardiogram 07/18/11: EF 60%, normal wall motion.   Cataract    Essential hypertension, benign    Glaucoma    Hypercholesteremia    Internal hemorrhoids     Noncompliance    Type 2 diabetes mellitus ([place]) 1990    Medications:  Medications Prior to Admission  Medication Sig Dispense Refill Last Dose   amLODipine (NORVASC) 10 MG tablet Take 1 tablet (10 mg total) by mouth daily. 90 tablet 3 09/07/2021   ammonium lactate (AMLACTIN) 12 % lotion Apply to both feet twice daily for dry skin. (Patient taking differently: 1 application as needed for dry skin. Apply to both feet twice daily for dry skin.) 400 g 6 Past Month   aspirin EC 81 MG tablet Take 1 tablet (81 mg total) by mouth daily. 30 tablet 4 09/07/2021   atorvastatin (LIPITOR) 40 MG tablet TAKE 1 TABLET BY MOUTH EVERY DAY (Patient taking differently: Take 40 mg by mouth daily. TAKE 1 TABLET BY MOUTH EVERY DAY)  90 tablet 1 09/07/2021   brimonidine (ALPHAGAN) 0.2 % ophthalmic solution Place 1 drop into the right eye 2 (two) times daily.    09/07/2021   dorzolamide (TRUSOPT) 2 % ophthalmic solution Place 1 drop into the right eye 2 (two) times daily.   09/07/2021   HUMALOG KWIKPEN 100 UNIT/ML KwikPen INJECT 10 UNITS INTO THE SKIN 3 (THREE) TIMES DAILY WITH MEALS. (Patient taking differently: Inject 10 Units into the skin 3 (three) times daily.) 15 mL 3 09/07/2021   hydrALAZINE (APRESOLINE) 50 MG tablet Take 1.5 tablets (75 mg total) by mouth 3 (three) times daily. 135 tablet 6 09/07/2021   insulin glargine (LANTUS SOLOSTAR) 100 UNIT/ML Solostar Pen Inject 38 Units into the skin at bedtime. 15 mL 6 09/06/2021   Multiple Vitamins-Minerals (MULTIVITAMIN WITH MINERALS) tablet Take 1 tablet by mouth daily.   09/07/2021   RHOPRESSA 0.02 % SOLN Place 1 drop into the right eye at bedtime.   09/06/2021   timolol (TIMOPTIC) 0.5 % ophthalmic solution Place 1 drop into the right eye 2 (two) times daily.   09/07/2021   BD PEN NEEDLE NANO 2ND GEN 32G X 4 MM MISC USE AS DIRECTED 3 TIMES A DAY. PLEASE SCHEDULE AN APPOINTMENT FOR ADDITIONAL REFILLS. 100 each 3    blood glucose meter kit and supplies KIT Dispense based on  patient and insurance preference. Use up to four times daily as directed. One Touch Verio 1 each 0    Continuous Blood Gluc Receiver (DEXCOM G6 RECEIVER) DEVI 1 Device by Does not apply route daily. 1 each 0    Continuous Blood Gluc Receiver (FREESTYLE LIBRE 14 DAY READER) DEVI USE AS DIRECTED 1 each 2    Continuous Blood Gluc Sensor (DEXCOM G6 SENSOR) MISC 1 packet by Does not apply route daily. 3 each 11    Continuous Blood Gluc Sensor (FREESTYLE LIBRE SENSOR SYSTEM) MISC Change sensor Q 2 wks 2 each 12    glucose blood (ACCU-CHEK AVIVA PLUS) test strip Use as instructed for 3 times daily testing of blood sugar. E11.9 100 each 6    glucose monitoring kit (FREESTYLE) monitoring kit 1 each by Does not apply route as needed for other. 1 each 0    Lancets MISC Use as directed.  Accu chek 2 100 each 11    latanoprost (XALATAN) 0.005 % ophthalmic solution Place 1 drop into the right eye at bedtime.      torsemide (DEMADEX) 20 MG tablet TAKE 2 TABLETS (40 MG TOTAL) BY MOUTH EVERY DAY (Patient taking differently: Take 40 mg by mouth daily.) 60 tablet 6     Assessment: 49 YOM w/ h/o PCI/DES stenting in 2012, presents with increasing SOB. Troponin trending up. Pharmacy consulted to start IV heparin for ACS.   Heparin level was therapeutic at 0.44, on 1300 units/hr. 1/23 however Hgb fell 7.4 plt stable 130s . With large drop in hgb, heparin drip was held overnight No bleeding today, HGB stable  Will resume with careful titration and no bolus   Goal of Therapy:  Heparin level 0.3-0.7 units/ml Monitor platelets by anticoagulation protocol: Yes   Plan:  Restart heparin drip 1300 uts/hr  Draw heparin level 6hr after start Daily heparin level and CBC  Monitor s/s bleeding     Bonnita Nasuti Pharm.D. CPP, BCPS Clinical Pharmacist [phone] 09/10/2021 2:38 PM    Please check AMION for all [place] phone numbers After 10:00 PM, call [place] [phone]

## 2021-09-10 NOTE — Progress Notes (Signed)
°   09/10/21 2200  BiPAP/CPAP/SIPAP  BiPAP/CPAP/SIPAP Pt Type Adult  Mask Type Full face mask  Mask Size Large  Set Rate 10 breaths/min  Respiratory Rate 13 breaths/min  IPAP 16 cmH20  EPAP 8 cmH2O  Oxygen Percent 75 %  Minute Ventilation 11.3  Leak 51  Peak Inspiratory Pressure (PIP) 16  Tidal Volume (Vt) 954  BiPAP/CPAP/SIPAP BiPAP  Patient Home Equipment No  Auto Titrate No  Press High Alarm 25 cmH2O  Press Low Alarm 5 cmH2O   Placed on biapap

## 2021-09-10 NOTE — Progress Notes (Signed)
ANTICOAGULATION CONSULT NOTE  Pharmacy Consult for heparin Indication: chest pain/ACS  Allergies  Allergen Reactions   Coreg [Carvedilol] Other (See Comments)    Redness and swelling of calf    Patient Measurements: Height: 5' 10"  (177.8 cm) Weight: 119 kg (262 lb 5.6 oz) IBW/kg (Calculated) : 73 Heparin Dosing Weight: 97.8 kg   Vital Signs: Temp: 96.9 F (36.1 C) (01/24 1523) Temp Source: Oral (01/24 1523) BP: 129/64 (01/24 1800) Pulse Rate: 69 (01/24 1955)  Labs: Recent Labs    09/08/21 0217 09/08/21 0426 09/08/21 0556 09/08/21 1205 09/08/21 1403 09/09/21 0405 09/09/21 0553 09/09/21 1138 09/09/21 1346 09/09/21 2113 09/10/21 0346 09/10/21 2012  HGB  --    < > 8.9*   < >  --  7.4*  --   --    < > 7.3* 8.3* 7.4*  HCT  --    < > 27.1*   < >  --  23.3*  --   --    < > 22.5* 25.3* 22.3*  PLT  --   --  156  --   --  134*  --   --   --   --  157  --   HEPARINUNFRC  --   --   --    < > 0.44 >1.10* 0.43  --   --   --   --  <0.10*  CREATININE 2.52*  --   --   --  2.75*  --   --  3.15*  --   --  3.32*  --   TROPONINIHS 2,512*  --  4,145*  --   --   --   --   --   --   --   --   --    < > = values in this interval not displayed.     Estimated Creatinine Clearance: 29.4 mL/min (A) (by C-G formula based on SCr of 3.32 mg/dL (H)).   Medical History: Past Medical History:  Diagnosis Date   Arthritis    CAD (coronary artery disease)    NSTEMI 06/2011:  LHC 07/18/11: Proximal diagonal 60%, distal LAD with a diabetic appearance and 60% stenosis, OM2 with an occluded superior branch and an inferior branch with 90%, EF 55% with inferior hypokinesis.  PCI: Promus DES to the OM2 inferior branch.  This vessel provides collaterals to the superior branch which remained occluded.  Echocardiogram 07/18/11: EF 60%, normal wall motion.   Cataract    Essential hypertension, benign    Glaucoma    Hypercholesteremia    Internal hemorrhoids    Noncompliance    Type 2 diabetes mellitus  ([place]) 1990    Medications:  Medications Prior to Admission  Medication Sig Dispense Refill Last Dose   amLODipine (NORVASC) 10 MG tablet Take 1 tablet (10 mg total) by mouth daily. 90 tablet 3 09/07/2021   ammonium lactate (AMLACTIN) 12 % lotion Apply to both feet twice daily for dry skin. (Patient taking differently: 1 application as needed for dry skin. Apply to both feet twice daily for dry skin.) 400 g 6 Past Month   aspirin EC 81 MG tablet Take 1 tablet (81 mg total) by mouth daily. 30 tablet 4 09/07/2021   atorvastatin (LIPITOR) 40 MG tablet TAKE 1 TABLET BY MOUTH EVERY DAY (Patient taking differently: Take 40 mg by mouth daily. TAKE 1 TABLET BY MOUTH EVERY DAY) 90 tablet 1 09/07/2021   brimonidine (ALPHAGAN) 0.2 % ophthalmic solution Place 1 drop into the right  eye 2 (two) times daily.    09/07/2021   dorzolamide (TRUSOPT) 2 % ophthalmic solution Place 1 drop into the right eye 2 (two) times daily.   09/07/2021   HUMALOG KWIKPEN 100 UNIT/ML KwikPen INJECT 10 UNITS INTO THE SKIN 3 (THREE) TIMES DAILY WITH MEALS. (Patient taking differently: Inject 10 Units into the skin 3 (three) times daily.) 15 mL 3 09/07/2021   hydrALAZINE (APRESOLINE) 50 MG tablet Take 1.5 tablets (75 mg total) by mouth 3 (three) times daily. 135 tablet 6 09/07/2021   insulin glargine (LANTUS SOLOSTAR) 100 UNIT/ML Solostar Pen Inject 38 Units into the skin at bedtime. 15 mL 6 09/06/2021   Multiple Vitamins-Minerals (MULTIVITAMIN WITH MINERALS) tablet Take 1 tablet by mouth daily.   09/07/2021   RHOPRESSA 0.02 % SOLN Place 1 drop into the right eye at bedtime.   09/06/2021   timolol (TIMOPTIC) 0.5 % ophthalmic solution Place 1 drop into the right eye 2 (two) times daily.   09/07/2021   BD PEN NEEDLE NANO 2ND GEN 32G X 4 MM MISC USE AS DIRECTED 3 TIMES A DAY. PLEASE SCHEDULE AN APPOINTMENT FOR ADDITIONAL REFILLS. 100 each 3    blood glucose meter kit and supplies KIT Dispense based on patient and insurance preference. Use up to four  times daily as directed. One Touch Verio 1 each 0    Continuous Blood Gluc Receiver (DEXCOM G6 RECEIVER) DEVI 1 Device by Does not apply route daily. 1 each 0    Continuous Blood Gluc Receiver (FREESTYLE LIBRE 14 DAY READER) DEVI USE AS DIRECTED 1 each 2    Continuous Blood Gluc Sensor (DEXCOM G6 SENSOR) MISC 1 packet by Does not apply route daily. 3 each 11    Continuous Blood Gluc Sensor (FREESTYLE LIBRE SENSOR SYSTEM) MISC Change sensor Q 2 wks 2 each 12    glucose blood (ACCU-CHEK AVIVA PLUS) test strip Use as instructed for 3 times daily testing of blood sugar. E11.9 100 each 6    glucose monitoring kit (FREESTYLE) monitoring kit 1 each by Does not apply route as needed for other. 1 each 0    Lancets MISC Use as directed.  Accu chek 2 100 each 11    latanoprost (XALATAN) 0.005 % ophthalmic solution Place 1 drop into the right eye at bedtime.      torsemide (DEMADEX) 20 MG tablet TAKE 2 TABLETS (40 MG TOTAL) BY MOUTH EVERY DAY (Patient taking differently: Take 40 mg by mouth daily.) 60 tablet 6     Assessment: 35 YOM w/ h/o PCI/DES stenting in 2012, presents with increasing SOB. Troponin trending up. Pharmacy consulted to start IV heparin for ACS.   Heparin level was therapeutic at 0.44, on 1300 units/hr. 1/23 however Hgb fell 7.4 plt stable 130s . With large drop in hgb, heparin drip was held overnight No bleeding today, HGB stable  Will resume with careful titration and no bolus  PM update - heparin level resulted undetectable, drawn ~64mn early. Hg remains stable at 7.4. No bleeding or issues with infusion per discussion with RN. Noted AKI - SCr trending up to 3.32.   Goal of Therapy:  Heparin level 0.3-0.7 units/ml Monitor platelets by anticoagulation protocol: Yes   Plan:  No bolus. Increase heparin drip to 1450 units/hr - conservative increase with recent large Hg drop  Check 8hr heparin level Monitor daily CBC, s/sx bleeding   HArturo Morton PharmD, BCPS Please check  AMION for all [place] numbers Clinical Pharmacist 09/10/2021 9:22  PM

## 2021-09-10 NOTE — Progress Notes (Signed)
Pharmacist Heart Failure Core Measure Documentation  Assessment: Derek Lowery has an EF documented as 30% ECHO 1/23.  Rationale: Heart failure patients with left ventricular systolic dysfunction (LVSD) and an EF < 40% should be prescribed an angiotensin converting enzyme inhibitor (ACEI) or angiotensin receptor blocker (ARB) at discharge unless a contraindication is documented in the medical record.  This patient is not currently on an ACEI or ARB for HF.  This note is being placed in the record in order to provide documentation that a contraindication to the use of these agents is present for this encounter.  ACE Inhibitor or Angiotensin Receptor Blocker is contraindicated (specify all that apply)  []   ACEI allergy AND ARB allergy []   Angioedema []   Moderate or severe aortic stenosis []   Hyperkalemia []   Hypotension []   Renal artery stenosis [x]   Worsening renal function, preexisting renal disease or dysfunction   Bonnita Nasuti Pharm.D. CPP, BCPS Clinical Pharmacist [phone] 09/10/2021 2:43 PM

## 2021-09-10 NOTE — Progress Notes (Signed)
NAME:  Derek Lowery, MRN:  [id number], DOB:  1955/09/28, LOS: 3 ADMISSION DATE:  09/07/2021, CONSULTATION DATE: 09/08/2021 REFERRING MD: Reggy Eye MD , CHIEF COMPLAINT: Pneumonia, non-ST relation MI  History of Present Illness:   66 year old with history of coronary artery disease, HFpEF, hypertension, diabetes, hyperlipidemia, glaucoma with legal blindness presenting with cough, chest pain.  Found to have multifocal pneumonia with non-ST elevation MI.  Started on Rocephin, azithromycin, heparin drip.  PCCM consulted on 1/22 for worsening dyspnea, hypoxia  Pertinent  Medical History    has a past medical history of Arthritis, CAD (coronary artery disease), Cataract, Essential hypertension, benign, Glaucoma, Hypercholesteremia, Internal hemorrhoids, Noncompliance, and Type 2 diabetes mellitus ([place]) (1990).   Significant Hospital Events: Including procedures, antibiotic start and stop dates in addition to other pertinent events   1/21- Admit 1/23 still on bipap, breaks on HHFNC, no improvement on FiO2, ongoing lasix gtt.  Stopped heparin given Hgb drop, CVP 21  1/21 CT chest > CT chest with patchy areas of consolidation, groundglass opacity and pulmonary edema  Interim History / Subjective:   Off biPAP around 4 am, on HHFNC- poor reserve and drops into the 80's with coughing spells. Some ongoing SOB but feels like mucinex is helping with chest congestion Denies nausea currently, wants food  CVP 18 sCr up 3.15 > 3.32 Remains on lasix gtt overall UOP down from yesterday  Hgb stable  afebrile  Objective   Blood pressure (!) 141/77, pulse 68, temperature 97.8 F (36.6 C), resp. rate 20, height 5' 10"  (1.778 m), weight 119 kg, SpO2 97 %. CVP:  [15 mmHg-28 mmHg] 18 mmHg  FiO2 (%):  [70 %-90 %] 90 %   Intake/Output Summary (Last 24 hours) at 09/10/2021 1052 Last data filed at 09/10/2021 0500 Gross per 24 hour  Intake 416.86 ml  Output 1230 ml  Net -813.14 ml   Filed Weights    09/07/21 1034 09/09/21 0420 09/10/21 0500  Weight: 112.9 kg 120.5 kg 119 kg   Examination: General:  Older male sitting upright in bed in NAD, resting HEENT: MM pink/moist Neuro:  easily arouses, oriented x 3 CV: rr, NSR PULM:  non labored, on HHFNC 90%/35L, diffuse rales/ rhonchi anteriorly with insp/ exp wheezing GI: soft,+bs, foley  Extremities: warm/dry, +2-3 generalized edema, RUE PICC   BUN/creatinine 43/2.52-> 51/ 2.75-> 63/ 3.32 Hemoglobin 9.5 > 8.9> 7.4>8.3 Mag 2.6  Coox 60  TTE 1/22 > EF 30-35%, mild global HK with more severe HK in inferolateral, anterolateral, and apical myocardium, G2DD, mildly reduced RV, normal PASP, est RA pressure of 8  (TTE 5/22 with EF 60-65%, no RWA, G2DD, nromal RV)  Resolved Hospital Problem list     Assessment & Plan:  Acute hypoxic respiratory failure secondary to multilobar pneumonia and pulmonary edema - continue betwenn BIPAP and HHFNC  - wean FiO2 for sat goal > 90% - diuresis as below - continue CAP coverage for now.  Recheck PCT in am - follow sputum cx w/normal flora, u. Strep neg, legionella pending  - remains at risk for intubation.  Keep NPO - continue aggressive pulm hygiene w/ IS/ flutter/ adding chest PT - continue mucinex, changing albuterol to q4   - CXR in am   Non-ST elevation MI Acute systolic on chronic diastolic heart failure Hypertension HLD - Cards following, appreciate input  - heparin held 1/23 given Hgb drop.  Stable since.  Will restart heparin per pharmacy, no bolus and monitor closely  - echo as above  w/ focal wall motion abnormality and new systolic HF, will need LHC when able given renal function and respiratory status  - goal MAP > 65 - coox down today, but he remains normotensive (on 3 HTN meds) - trend CVP via PICC  - increasing lasix gtt 10> 50m/hr with metolazone 572mand albumin today as remains significantly volume up  - strict I/Os, daily wts - keep K>4, Mag > 2 - continue ASA, Lipitor,  hydralazine 10032mID, imdur 55m83mily.  Lopressor changing to coreg 1/23 given new HFrEF  AKI on CKD3 - sCr continues to worsening and UOP has dropped off over last 12 hours - remains hypervolemic on exam  - check renal ultrasound - if renal function continues to worsen, will need to consult nephrology - continue diuresis as above monitoring closely - strict I/Os - trend renal function, renal dose meds, avoid nephrotoxins   Diabetes - remains NPO.  CBGs remain in 80 - SSI to sensitive prn  Acute on chronic normocytic anemia - monitor Hgb closely restarting heparin today  - no obvious source of bleeding yet, closely monitor  - send stool for hemoccult when able, continue empiric PPI  - transfuse for Hgb < 7 or may need to consider 8 given NSTEMI and new HF   Best Practice (right click and "Reselect all SmartList Selections" daily)   Diet/type: NPO;  will order cortrak for 1/25, post pyloric for aspiration precautions to start nutrition DVT prophylaxis: systemic heparin- held 1/23.  Restarting 1/24 GI prophylaxis: PPI Lines: Central line- RUE PICC Foley:  Yes, and it is still needed Code Status:  full code Last date of multidisciplinary goals of care discussion []  pending.  Patient updated on plan of care 1/24.  No family at bedside.   Labs   CBC: Recent Labs  Lab 09/07/21 1030 09/08/21 0426 09/08/21 0556 09/08/21 1205 09/08/21 1254 09/09/21 0405 09/09/21 1346 09/09/21 2113 09/10/21 0346  WBC 6.6  --  9.8  --   --  9.5  --   --  9.2  NEUTROABS 5.2  --   --   --   --   --   --   --   --   HGB 9.6*   < > 8.9*   < > 9.5* 7.4* 8.8* 7.3* 8.3*  HCT 29.0*   < > 27.1*   < > 28.0* 23.3* 26.7* 22.5* 25.3*  MCV 86.3  --  88.3  --   --  88.6  --   --  86.3  PLT 130*  --  156  --   --  134*  --   --  157   < > = values in this interval not displayed.    Basic Metabolic Panel: Recent Labs  Lab 09/07/21 1030 09/08/21 0217 09/08/21 0426 09/08/21 1205 09/08/21 1254  09/08/21 1403 09/09/21 0405 09/09/21 1138 09/10/21 0346  NA 139 137   < > 138 138 134*  --  139 138  K 3.5 4.0   < > 3.9 4.0 4.0  --  3.4* 3.8  CL 102 100  --   --   --  102  --  100 99  CO2 27 27  --   --   --  24  --  28 28  GLUCOSE 165* 132*  --   --   --  137*  --  92 102*  BUN 37* 43*  --   --   --  51*  --  61* 63*  CREATININE 2.22* 2.52*  --   --   --  2.75*  --  3.15* 3.32*  CALCIUM 8.1* 7.9*  --   --   --  7.4*  --  7.9* 7.7*  MG  --   --   --   --   --   --  2.3  --  2.6*  PHOS  --   --   --   --   --   --  4.4  --   --    < > = values in this interval not displayed.   GFR: Estimated Creatinine Clearance: 29.4 mL/min (A) (by C-G formula based on SCr of 3.32 mg/dL (H)). Recent Labs  Lab 09/07/21 1030 09/08/21 0217 09/08/21 0556 09/09/21 0405 09/10/21 0346  PROCALCITON  --  0.30  --  5.65  --   WBC 6.6  --  9.8 9.5 9.2    Liver Function Tests: Recent Labs  Lab 09/07/21 1030  AST 33  ALT 20  ALKPHOS 67  BILITOT 0.5  PROT 6.4*  ALBUMIN 3.2*   Recent Labs  Lab 09/07/21 1030  LIPASE 51   No results for input(s): AMMONIA in the last 168 hours.  ABG    Component Value Date/Time   PHART 7.395 09/08/2021 1254   PCO2ART 45.6 09/08/2021 1254   PO2ART 54 (L) 09/08/2021 1254   HCO3 27.9 09/08/2021 1254   TCO2 29 09/08/2021 1254   ACIDBASEDEF 0.3 10/05/2015 1419   O2SAT 59.8 09/10/2021 0346     Coagulation Profile: No results for input(s): INR, PROTIME in the last 168 hours.  Cardiac Enzymes: No results for input(s): CKTOTAL, CKMB, CKMBINDEX, TROPONINI in the last 168 hours.  HbA1C: HbA1c, POC (controlled diabetic range)  Date/Time Value Ref Range Status  06/17/2021 10:41 AM 7.1 (A) 0.0 - 7.0 % Final  11/16/2020 04:42 PM 8.8 (A) 0.0 - 7.0 % Final   Hgb A1c MFr Bld  Date/Time Value Ref Range Status  09/08/2021 05:00 AM 7.1 (H) 4.8 - 5.6 % Final    Comment:    (NOTE)         Prediabetes: 5.7 - 6.4         Diabetes: >6.4         Glycemic control  for adults with diabetes: <7.0     CBG: Recent Labs  Lab 09/09/21 1547 09/09/21 2003 09/10/21 0005 09/10/21 0452 09/10/21 0806  GLUCAP 81 85 82 98 88     Critical care time: 30 mins      Kennieth Rad, ACNP [facility] 09/10/2021, 10:52 AM  See Amion for pager If no response to pager, please call PCCM consult pager After 7:00 pm call [place]

## 2021-09-11 ENCOUNTER — Inpatient Hospital Stay (HOSPITAL_COMMUNITY): Payer: 59

## 2021-09-11 DIAGNOSIS — I5033 Acute on chronic diastolic (congestive) heart failure: Secondary | ICD-10-CM | POA: Diagnosis not present

## 2021-09-11 DIAGNOSIS — J810 Acute pulmonary edema: Secondary | ICD-10-CM

## 2021-09-11 DIAGNOSIS — J159 Unspecified bacterial pneumonia: Secondary | ICD-10-CM

## 2021-09-11 DIAGNOSIS — J9601 Acute respiratory failure with hypoxia: Secondary | ICD-10-CM | POA: Diagnosis not present

## 2021-09-11 DIAGNOSIS — N179 Acute kidney failure, unspecified: Secondary | ICD-10-CM | POA: Diagnosis not present

## 2021-09-11 DIAGNOSIS — I25118 Atherosclerotic heart disease of native coronary artery with other forms of angina pectoris: Secondary | ICD-10-CM | POA: Diagnosis not present

## 2021-09-11 LAB — BASIC METABOLIC PANEL
Anion gap: 11 (ref 5–15)
BUN: 71 mg/dL — ABNORMAL HIGH (ref 8–23)
CO2: 30 mmol/L (ref 22–32)
Calcium: 8 mg/dL — ABNORMAL LOW (ref 8.9–10.3)
Chloride: 95 mmol/L — ABNORMAL LOW (ref 98–111)
Creatinine, Ser: 3.06 mg/dL — ABNORMAL HIGH (ref 0.61–1.24)
GFR, Estimated: 22 mL/min — ABNORMAL LOW (ref >60)
Glucose, Bld: 122 mg/dL — ABNORMAL HIGH (ref 70–99)
Potassium: 3.2 mmol/L — ABNORMAL LOW (ref 3.5–5.1)
Sodium: 136 mmol/L (ref 135–145)

## 2021-09-11 LAB — HEMOGLOBIN AND HEMATOCRIT, BLOOD
HCT: 21.2 % — ABNORMAL LOW (ref 39.0–52.0)
Hemoglobin: 7.1 g/dL — ABNORMAL LOW (ref 13.0–17.0)

## 2021-09-11 LAB — COOXEMETRY PANEL
Carboxyhemoglobin: 1.4 % (ref 0.5–1.5)
Methemoglobin: 0.9 % (ref 0.0–1.5)
O2 Saturation: 62.8 %
Total hemoglobin: 8 g/dL — ABNORMAL LOW (ref 12.0–16.0)

## 2021-09-11 LAB — CBC
HCT: 23.3 % — ABNORMAL LOW (ref 39.0–52.0)
Hemoglobin: 7.6 g/dL — ABNORMAL LOW (ref 13.0–17.0)
MCH: 28.3 pg (ref 26.0–34.0)
MCHC: 32.6 g/dL (ref 30.0–36.0)
MCV: 86.6 fL (ref 80.0–100.0)
Platelets: 161 10*3/uL (ref 150–400)
RBC: 2.69 MIL/uL — ABNORMAL LOW (ref 4.22–5.81)
RDW: 14.2 % (ref 11.5–15.5)
WBC: 7.2 10*3/uL (ref 4.0–10.5)
nRBC: 0 % (ref 0.0–0.2)

## 2021-09-11 LAB — GLUCOSE, CAPILLARY
Glucose-Capillary: 167 mg/dL — ABNORMAL HIGH (ref 70–99)
Glucose-Capillary: 120 mg/dL — ABNORMAL HIGH (ref 70–99)
Glucose-Capillary: 151 mg/dL — ABNORMAL HIGH (ref 70–99)
Glucose-Capillary: 113 mg/dL — ABNORMAL HIGH (ref 70–99)
Glucose-Capillary: 120 mg/dL — ABNORMAL HIGH (ref 70–99)
Glucose-Capillary: 182 mg/dL — ABNORMAL HIGH (ref 70–99)

## 2021-09-11 LAB — BRAIN NATRIURETIC PEPTIDE: B Natriuretic Peptide: 964.9 pg/mL — ABNORMAL HIGH (ref 0.0–100.0)

## 2021-09-11 LAB — HEPARIN LEVEL (UNFRACTIONATED): Heparin Unfractionated: 0.4 IU/mL (ref 0.30–0.70)

## 2021-09-11 MED ORDER — ALBUTEROL SULFATE (2.5 MG/3ML) 0.083% IN NEBU: 2.5000 mg | INHALATION_SOLUTION | RESPIRATORY_TRACT | Status: DC | PRN

## 2021-09-11 MED ORDER — TRIFLURIDINE 1 % OP SOLN
1.0000 [drp] | OPHTHALMIC | Status: DC
  Filled 2021-09-11: qty 7.5

## 2021-09-11 MED ORDER — POTASSIUM CHLORIDE CRYS ER 20 MEQ PO TBCR
40.0000 meq | EXTENDED_RELEASE_TABLET | Freq: Once | ORAL | Status: AC
  Administered 2021-09-11: 13:00:00 40 meq via ORAL
  Filled 2021-09-11: qty 2

## 2021-09-11 MED ORDER — ALBUTEROL SULFATE (2.5 MG/3ML) 0.083% IN NEBU
2.5000 mg | INHALATION_SOLUTION | Freq: Three times a day (TID) | RESPIRATORY_TRACT | Status: DC
  Administered 2021-09-11 – 2021-09-12 (×4): 2.5 mg via RESPIRATORY_TRACT
  Filled 2021-09-11 (×4): qty 3

## 2021-09-11 MED ORDER — METOLAZONE 5 MG PO TABS
5.0000 mg | ORAL_TABLET | Freq: Once | ORAL | Status: AC
  Administered 2021-09-11: 16:00:00 5 mg via ORAL
  Filled 2021-09-11: qty 1

## 2021-09-11 MED ORDER — TRAMADOL HCL 50 MG PO TABS
50.0000 mg | ORAL_TABLET | Freq: Four times a day (QID) | ORAL | Status: DC | PRN
  Administered 2021-09-11 – 2021-09-15 (×4): 50 mg via ORAL
  Filled 2021-09-11 (×4): qty 1

## 2021-09-11 MED ORDER — POTASSIUM CHLORIDE CRYS ER 20 MEQ PO TBCR
40.0000 meq | EXTENDED_RELEASE_TABLET | Freq: Once | ORAL | Status: AC
  Administered 2021-09-11: 21:00:00 40 meq via ORAL
  Filled 2021-09-11 (×3): qty 2

## 2021-09-11 NOTE — Progress Notes (Signed)
NAME:  Derek Lowery, MRN:  [id number], DOB:  1956-01-06, LOS: 4 ADMISSION DATE:  09/07/2021, CONSULTATION DATE: 09/08/2021 REFERRING MD: Reggy Eye MD , CHIEF COMPLAINT: Pneumonia, non-ST relation MI  History of Present Illness:   66 year old with history of coronary artery disease, HFpEF, hypertension, diabetes, hyperlipidemia, glaucoma with legal blindness presenting with cough, chest pain.  Found to have multifocal pneumonia with non-ST elevation MI.  Started on Rocephin, azithromycin, heparin drip.  PCCM consulted on 1/22 for worsening dyspnea, hypoxia  Pertinent  Medical History    has a past medical history of Arthritis, CAD (coronary artery disease), Cataract, Essential hypertension, benign, Glaucoma, Hypercholesteremia, Internal hemorrhoids, Noncompliance, and Type 2 diabetes mellitus ([place]) (1990).   Significant Hospital Events: Including procedures, antibiotic start and stop dates in addition to other pertinent events   1/21- Admit 1/23 still on bipap, breaks on HHFNC, no improvement on FiO2, ongoing lasix gtt.  Stopped heparin given Hgb drop, CVP 21 1/24 increased lasix gtt, metolazone and albumin, sCr up, remains on HHFNC / BiPAP, heparin restarted no bolus, Hgb stable  1/21 CT chest > CT chest with patchy areas of consolidation, groundglass opacity and pulmonary edema  Interim History / Subjective:   Down to [place] 65% and 20L [place] 62 CVP 10 Feels much better today Diuresed 3.5L yesterday Tolerated clear liquids thus far, wants more to eat  Hgb stable   Objective   Blood pressure 134/73, pulse 73, temperature 97.9 F (36.6 C), temperature source Axillary, resp. rate 18, height 5' 10"  (1.778 m), weight 117.2 kg, SpO2 94 %. CVP:  [8 mmHg-25 mmHg] 10 mmHg  FiO2 (%):  [65 %-80 %] 65 %   Intake/Output Summary (Last 24 hours) at 09/11/2021 1038 Last data filed at 09/11/2021 0830 Gross per 24 hour  Intake 534.52 ml  Output 4525 ml  Net -3990.48 ml   Filed Weights    09/09/21 0420 09/10/21 0500 09/11/21 0500  Weight: 120.5 kg 119 kg 117.2 kg   Examination: General:  Older male sitting upright in bed in NAD, looks better today overall HEENT: MM pink/moist, pupils 3/reactive Neuro:  Aox3, MAE CV: rr, no murmur PULM:  non labored on HHFNC, ongoing rales in LUL and LLL, slight wheeze on right, faint rales in RLL GI: soft, bs+, NT, foley  Extremities: warm/dry, improving edema  Skin: no rashes   BUN/creatinine  63/ 3.32 > 71/ 3.06 Hemoglobin 9.5 > 8.9> 7.4>8.3> 7.2 Coox 60> 62  UOP 3.9L/24 hrs Net -5.8 Wts 119> 117.2  1/25 CXR> slight improvement in multifocal pna, esp on L, likely small pleural effusions  TTE 1/22 > EF 30-35%, mild global HK with more severe HK in inferolateral, anterolateral, and apical myocardium, G2DD, mildly reduced RV, normal PASP, est RA pressure of 8  (TTE 5/22 with EF 60-65%, no RWA, G2DD, nromal RV)  Resolved Hospital Problem list     Assessment & Plan:  Acute hypoxic respiratory failure secondary to multilobar pneumonia and pulmonary edema - continue BiPAP qHS and prn, continue to wean HHFNC as tolerated for goal sat > 90 - decrease albuterol from q 4 to TID and prn  - continue azithro/ ceftriaxone for 5 days of therapy  - sputum cx no growth.  Strep and legionella neg - continue aggressive pulm hygiene w/ IS/ flutter/ chest PT/ mucinex -> mobilizing today  - diuresis as below - respiratory status has been very tenuous but appears much better today, we can advance diet and hold off on cortrak for now  Non-ST elevation MI Acute systolic on chronic diastolic heart failure Hypertension HLD - Cards following, appreciate input  - heparin held 1/23 given Hgb drop.  Restarted 1/24 no bolus.  Hgb since remains stable. - echo as above w/ focal wall motion abnormality and new systolic HF, will need LHC when able given renal function and respiratory status  - goal MAP > 65 - coox stable, remains normotensive (on 3 HTN  meds) - trend CVP  - continue lasix gtt 56m/hr  - strict I/Os, daily wts - keep K>4, Mag > 2 - continue ASA, Lipitor, hydralazine 1027mTID, imdur 6026maily.  Lopressor changing to coreg 1/23 given new HFrEF  AKI on CKD3 - renal function better today  - renal ultrasound negative - improving volume status, responded well to albumin with lasix and metolazone - continue diuresis as above monitoring closely - strict I/Os - trend renal function, renal dose meds, avoid nephrotoxins   Diabetes - SSI to sensitive prn - may need to add more coverage as diet advances, goal < 180  Acute on chronic normocytic anemia - H/H stable, continue to closely monitor on heparin  - send stool for hemoccult when able, continue empiric PPI  - transfuse for Hgb < 7 or may need to consider 8 given NSTEMI and new HF  Best Practice (right click and "Reselect all SmartList Selections" daily)   Diet/type: clear liquids DVT prophylaxis: systemic heparin GI prophylaxis: PPI Lines: Central line- RUE PICC Foley:  Yes, and it is still needed Code Status:  full code Last date of multidisciplinary goals of care discussion []  ongoing.   Labs   CBC: Recent Labs  Lab 09/07/21 1030 09/08/21 0426 09/08/21 0556 09/08/21 1205 09/09/21 0405 09/09/21 1346 09/09/21 2113 09/10/21 0346 09/10/21 2012 09/11/21 0058 09/11/21 0516  WBC 6.6  --  9.8  --  9.5  --   --  9.2  --   --  7.2  NEUTROABS 5.2  --   --   --   --   --   --   --   --   --   --   HGB 9.6*   < > 8.9*   < > 7.4*   < > 7.3* 8.3* 7.4* 7.1* 7.6*  HCT 29.0*   < > 27.1*   < > 23.3*   < > 22.5* 25.3* 22.3* 21.2* 23.3*  MCV 86.3  --  88.3  --  88.6  --   --  86.3  --   --  86.6  PLT 130*  --  156  --  134*  --   --  157  --   --  161   < > = values in this interval not displayed.    Basic Metabolic Panel: Recent Labs  Lab 09/08/21 0217 09/08/21 0426 09/08/21 1254 09/08/21 1403 09/09/21 0405 09/09/21 1138 09/10/21 0346 09/11/21 0516  NA  137   < > 138 134*  --  139 138 136  K 4.0   < > 4.0 4.0  --  3.4* 3.8 3.2*  CL 100  --   --  102  --  100 99 95*  CO2 27  --   --  24  --  28 28 30   GLUCOSE 132*  --   --  137*  --  92 102* 122*  BUN 43*  --   --  51*  --  61* 63* 71*  CREATININE 2.52*  --   --  2.75*  --  3.15* 3.32* 3.06*  CALCIUM 7.9*  --   --  7.4*  --  7.9* 7.7* 8.0*  MG  --   --   --   --  2.3  --  2.6*  --   PHOS  --   --   --   --  4.4  --   --   --    < > = values in this interval not displayed.   GFR: Estimated Creatinine Clearance: 31.7 mL/min (A) (by C-G formula based on SCr of 3.06 mg/dL (H)). Recent Labs  Lab 09/08/21 0217 09/08/21 0556 09/09/21 0405 09/10/21 0346 09/11/21 0516  PROCALCITON 0.30  --  5.65  --   --   WBC  --  9.8 9.5 9.2 7.2    Liver Function Tests: Recent Labs  Lab 09/07/21 1030  AST 33  ALT 20  ALKPHOS 67  BILITOT 0.5  PROT 6.4*  ALBUMIN 3.2*   Recent Labs  Lab 09/07/21 1030  LIPASE 51   No results for input(s): AMMONIA in the last 168 hours.  ABG    Component Value Date/Time   PHART 7.395 09/08/2021 1254   PCO2ART 45.6 09/08/2021 1254   PO2ART 54 (L) 09/08/2021 1254   HCO3 27.9 09/08/2021 1254   TCO2 29 09/08/2021 1254   ACIDBASEDEF 0.3 10/05/2015 1419   O2SAT 62.8 09/11/2021 0623     Coagulation Profile: No results for input(s): INR, PROTIME in the last 168 hours.  Cardiac Enzymes: No results for input(s): CKTOTAL, CKMB, CKMBINDEX, TROPONINI in the last 168 hours.  HbA1C: HbA1c, POC (controlled diabetic range)  Date/Time Value Ref Range Status  06/17/2021 10:41 AM 7.1 (A) 0.0 - 7.0 % Final  11/16/2020 04:42 PM 8.8 (A) 0.0 - 7.0 % Final   Hgb A1c MFr Bld  Date/Time Value Ref Range Status  09/08/2021 05:00 AM 7.1 (H) 4.8 - 5.6 % Final    Comment:    (NOTE)         Prediabetes: 5.7 - 6.4         Diabetes: >6.4         Glycemic control for adults with diabetes: <7.0     CBG: Recent Labs  Lab 09/10/21 1535 09/10/21 2024 09/11/21 0103  09/11/21 0524 09/11/21 0832  GLUCAP 102* 212* 167* 120* 151*     Critical care time: 30 mins    Kennieth Rad, ACNP [facility] 09/11/2021, 10:38 AM  See Amion for pager If no response to pager, please call PCCM consult pager After 7:00 pm call Elink

## 2021-09-11 NOTE — Progress Notes (Signed)
Progress Note  Patient Name: Derek Lowery Date of Encounter: 09/11/2021  [facility] Cardiologist: Skeet Latch, MD   Subjective   Breathing is improving.  Pain in hip but otherwise feeling well.  Inpatient Medications    Scheduled Meds:  albuterol  2.5 mg Nebulization TID   aspirin EC  81 mg Oral Daily   atorvastatin  40 mg Oral Daily   azithromycin  500 mg Oral Daily   brimonidine  1 drop Right Eye BID   carvedilol  12.5 mg Oral BID WC   Chlorhexidine Gluconate Cloth  6 each Topical Daily   dorzolamide  1 drop Right Eye BID   guaiFENesin  600 mg Oral BID   hydrALAZINE  100 mg Oral TID   insulin aspart  0-9 Units Subcutaneous Q4H   isosorbide mononitrate  60 mg Oral Daily   Netarsudil Dimesylate  1 drop Right Eye QHS   pantoprazole  40 mg Oral BID   senna  1 tablet Oral BID   sodium chloride flush  10-40 mL Intracatheter Q12H   timolol  1 drop Right Eye BID   Continuous Infusions:  sodium chloride Stopped (09/08/21 1437)   cefTRIAXone (ROCEPHIN)  IV Stopped (09/10/21 1524)   furosemide (LASIX) 200 mg in dextrose 5% 100 mL (64m/mL) infusion 15 mg/hr (09/11/21 0836)   heparin 1,450 Units/hr (09/11/21 0926)   PRN Meds: sodium chloride, acetaminophen **OR** acetaminophen, albuterol, ammonium lactate, docusate sodium, ondansetron (ZOFRAN) IV, polyethylene glycol, sodium chloride flush   Vital Signs    Vitals:   09/11/21 0625 09/11/21 0700 09/11/21 0743 09/11/21 0900  BP:  134/73    Pulse: 66 71  73  Resp: 14 (!) 21  18  Temp: 97.9 F (36.6 C)     TempSrc: Axillary     SpO2: 99% 98% 97% 94%  Weight:      Height:        Intake/Output Summary (Last 24 hours) at 09/11/2021 0951 Last data filed at 09/11/2021 0830 Gross per 24 hour  Intake 534.52 ml  Output 4525 ml  Net -3990.48 ml   Last 3 Weights 09/11/2021 09/10/2021 09/09/2021  Weight (lbs) 258 lb 6.1 oz 262 lb 5.6 oz 265 lb 10.5 oz  Weight (kg) 117.2 kg 119 kg 120.5 kg      Telemetry    Sinus  rhythm.  PVCs.- Personally Reviewed  ECG    Sinus rhythm.  Rate 94 bpm.  Right bundle branch block.  Left posterior fascicular block. - Personally Reviewed  Physical Exam   VS:  BP 134/73    Pulse 73    Temp 97.9 F (36.6 C) (Axillary)    Resp 18    Ht 5' 10"  (1.778 m)    Wt 117.2 kg    SpO2 94%    BMI 37.07 kg/m  , BMI Body mass index is 37.07 kg/m. GENERAL: Critically ill-appearing.  Mild respiratory distress on high flow oxygen. HEENT: Pupils equal round and reactive, fundi not visualized, oral mucosa unremarkable NECK: + jugular venous distention, waveform within normal limits, carotid upstroke brisk and symmetric, no bruits, no thyromegaly LUNGS: Coarse breath with rhonchi and wheezing bilaterally. HEART:  RRR.  PMI not displaced or sustained,S1 and S2 within normal limits, no S3, no S4, no clicks, no rubs, no murmurs ABD:  Flat, positive bowel sounds normal in frequency in pitch, no bruits, no rebound, no guarding, no midline pulsatile mass, no hepatomegaly, no splenomegaly EXT:  2 plus pulses throughout, 2 +woody  lower extremity edema bilaterally. Improving.  Unna boots bilaterally, no cyanosis no clubbing SKIN:  No rashes no nodules NEURO:  Cranial nerves II through XII grossly intact, motor grossly intact throughout PSYCH:  Cognitively intact, oriented to person place and time   Labs    High Sensitivity Troponin:   Recent Labs  Lab 09/07/21 1255 09/07/21 1757 09/07/21 2110 09/08/21 0217 09/08/21 0556  TROPONINIHS 141* 333* 714* 2,512* 4,145*     Chemistry Recent Labs  Lab 09/07/21 1030 09/08/21 0217 09/09/21 0405 09/09/21 1138 09/10/21 0346 09/11/21 0516  NA 139   < >  --  139 138 136  K 3.5   < >  --  3.4* 3.8 3.2*  CL 102   < >  --  100 99 95*  CO2 27   < >  --  28 28 30   GLUCOSE 165*   < >  --  92 102* 122*  BUN 37*   < >  --  61* 63* 71*  CREATININE 2.22*   < >  --  3.15* 3.32* 3.06*  CALCIUM 8.1*   < >  --  7.9* 7.7* 8.0*  MG  --   --  2.3  --  2.6*   --   PROT 6.4*  --   --   --   --   --   ALBUMIN 3.2*  --   --   --   --   --   AST 33  --   --   --   --   --   ALT 20  --   --   --   --   --   ALKPHOS 67  --   --   --   --   --   BILITOT 0.5  --   --   --   --   --   GFRNONAA 32*   < >  --  21* 20* 22*  ANIONGAP 10   < >  --  11 11 11    < > = values in this interval not displayed.    Lipids  Recent Labs  Lab 09/08/21 0500  CHOL 95  TRIG 38  HDL 33*  LDLCALC 54  CHOLHDL 2.9    Hematology Recent Labs  Lab 09/09/21 0405 09/09/21 1346 09/10/21 0346 09/10/21 2012 09/11/21 0058 09/11/21 0516  WBC 9.5  --  9.2  --   --  7.2  RBC 2.63*  --  2.93*  --   --  2.69*  HGB 7.4*   < > 8.3* 7.4* 7.1* 7.6*  HCT 23.3*   < > 25.3* 22.3* 21.2* 23.3*  MCV 88.6  --  86.3  --   --  86.6  MCH 28.1  --  28.3  --   --  28.3  MCHC 31.8  --  32.8  --   --  32.6  RDW 14.6  --  14.5  --   --  14.2  PLT 134*  --  157  --   --  161   < > = values in this interval not displayed.   Thyroid No results for input(s): TSH, FREET4 in the last 168 hours.  BNP Recent Labs  Lab 09/08/21 1403 09/09/21 0405 09/11/21 0516  BNP 1,081.8* 618.3* 964.9*    DDimer No results for input(s): DDIMER in the last 168 hours.   Radiology    US RENAL  Result Date: 09/10/2021 CLINICAL DATA:  A 66 year old  male presents with acute kidney injury. EXAM: RENAL / URINARY TRACT ULTRASOUND COMPLETE COMPARISON:  Prior renal imaging from November of 2021 and CT from February of 2019 FINDINGS: Right Kidney: Renal measurements: 11.3 x 5.9 x 5.4 cm = volume: 187 mL. Mild increased cortical echogenicity with parenchymal thinning and cortical scarring. No hydronephrosis. Left Kidney: Renal measurements: 12.8 x 6.4 x 6.3 cm = volume: 272 mL. Increased cortical echogenicity without hydronephrosis. Signs of mild cortical scarring and parenchymal thinning Bladder: Decompressed with Foley catheter, not well evaluated. Other: None. IMPRESSION: Signs of medical renal disease without  hydronephrosis. Electronically Signed   By: Zetta Bills M.D.   On: 09/10/2021 11:23   DG Chest Port 1 View  Result Date: 09/11/2021 CLINICAL DATA:  Shortness of breath, respiratory failure. EXAM: PORTABLE CHEST 1 VIEW COMPARISON:  09/09/2021 and CT chest 09/07/2021. FINDINGS: Trachea is midline. Heart size stable. Right PICC tip is at the SVC RA junction. Mixed interstitial and airspace opacification bilaterally, with slight improvement in aeration in the left perihilar region. Probable bilateral pleural effusions. IMPRESSION: 1. Persistent mixed interstitial and airspace opacification with slight improvement in aeration in the left perihilar region, findings likely due to pneumonia. 2. Probable bilateral pleural effusions. Electronically Signed   By: Lorin Picket M.D.   On: 09/11/2021 08:34    Cardiac Studies   Echo 09/08/21: 1. Mild global hypokinesis with more severe hypokinesis in the  inferolateral, anterolateral, and apical myocardium. Left ventricular  ejection fraction, by estimation, is 30 to 35%. The left ventricle has  moderately decreased function. The left ventricle   demonstrates regional wall motion abnormalities (see scoring  diagram/findings for description). Left ventricular diastolic parameters  are consistent with Grade II diastolic dysfunction (pseudonormalization).   2. Right ventricular systolic function is mildly reduced. The right  ventricular size is normal. There is normal pulmonary artery systolic  pressure.   3. The mitral valve is normal in structure. Trivial mitral valve  regurgitation. No evidence of mitral stenosis.   4. The aortic valve is tricuspid. Aortic valve regurgitation is not  visualized. No aortic stenosis is present.   5. The inferior vena cava is dilated in size with >50% respiratory  variability, suggesting right atrial pressure of 8 mmHg.   Patient Profile     Mr. Valbuena is a 7M with CAD status post OM1 PCI, chronic diastolic heart  failure, COPD, hypertension, hyperlipidemia, and diabetes admitted with acute on chronic diastolic heart failure and NSTEMI.   Assessment & Plan    # Hypoxic respiratory failure: # Acute systolic and diastolic heart failure: # CAP: # COPD exacerbation: # Hypertension:  Shortness of breath improving.  Still requiring 25L HFNC.  Remains on ABX for CAP.  He is diuresing well on IV Lasix drip.  He was net -3.5 L yesterday and his creatinine is improving.  He remains volume overloaded.  Continue with Lasix drip.  CVP monitoring has been labile but he clearly is limb overloaded.  Given his diabetes, heart failure, and renal dysfunction, would consider Farxiga prior to discharge.  Given his new systolic dysfunction amlodipine was discontinued.  Blood pressure well controlled on his regimen of hydralazine, carvedilol, and Imdur.     # NSTEMI: # Status post PCI, moderate CAD: # Hyperlipidemia:  His current presentation is consistent with NSTEMI and ischemia.  Echo revealed reduced systolic function with focal wall motion abnormality suspicious for obstructive coronary disease.  Right now he has worsened anemia as well as AKI.  Cannot plan  for an invasive work-up until his pneumonia is resolved, volume status has improved, renal function has stabilized, and blood loss source is identified.  Switched to carvedilol as below.  Continue atorvastatin.  Medical management for now.  Added Imdur and he remains chest pain-free.  He was restarted on heparin and h/h stable this AM.    # Anemia:  Significant hemoglobin drop after starting heparin.  Stopped heparin and added a PPI.  Heparin has been resumed.  He will need a GI consult before invasive angiography.  Total critical care time:40 minutes. Critical care time was exclusive of separately billable procedures and treating other patients. Critical care was necessary to treat or prevent imminent or life-threatening deterioration. Critical care was time spent  personally by me on the following activities: development of treatment plan with patient and/or surrogate as well as nursing, discussions with consultants, evaluation of patient's response to treatment, examination of patient, obtaining history from patient or surrogate, ordering and performing treatments and interventions, ordering and review of laboratory studies, ordering and review of radiographic studies, pulse oximetry and re-evaluation of patient's condition.     For questions or updates, please contact [place] Please consult [email or web address] for contact info under        Signed, Skeet Latch, MD  09/11/2021, 9:51 AM

## 2021-09-11 NOTE — Progress Notes (Signed)
°   09/11/21 0625  Therapy Vitals  Pulse Rate 66  Resp 14  MEWS Score/Color  MEWS Score 0  MEWS Score Color Green  Oxygen Therapy/Pulse Ox  O2 Device HFNC  O2 Therapy Oxygen  O2 Flow Rate (L/min) 25 L/min  FiO2 (%) 80 %  SpO2 99 %   Took pt. Off of bipap placed on HHFNC 80% 25L

## 2021-09-11 NOTE — Progress Notes (Signed)
CPT held at this time due to patient sitting up in chair.

## 2021-09-11 NOTE — Progress Notes (Signed)
eLink Physician-Brief Progress Note Patient Name: Derek Lowery DOB: [birth year] MRN: [id number]   Date of Service  09/11/2021  HPI/Events of Note  Pain, unrelieved by acetaminophen  eICU Interventions  Placed order for PRN tramadol for moderate pain. Will follow response.         [place] 09/11/2021, 8:14 PM

## 2021-09-11 NOTE — Progress Notes (Signed)
ANTICOAGULATION CONSULT NOTE  Pharmacy Consult for heparin Indication: chest pain/ACS  Allergies  Allergen Reactions   Coreg [Carvedilol] Other (See Comments)    Redness and swelling of calf    Patient Measurements: Height: 5' 10"  (177.8 cm) Weight: 117.2 kg (258 lb 6.1 oz) IBW/kg (Calculated) : 73 Heparin Dosing Weight: 97.8 kg   Vital Signs: Temp: 97.9 F (36.6 C) (01/25 1219) Temp Source: Oral (01/25 1219) BP: 136/69 (01/25 1300) Pulse Rate: 67 (01/25 1300)  Labs: Recent Labs    09/09/21 0405 09/09/21 0553 09/09/21 1138 09/09/21 1346 09/10/21 0346 09/10/21 2012 09/11/21 0058 09/11/21 0516  HGB 7.4*  --   --    < > 8.3* 7.4* 7.1* 7.6*  HCT 23.3*  --   --    < > 25.3* 22.3* 21.2* 23.3*  PLT 134*  --   --   --  157  --   --  161  HEPARINUNFRC >1.10* 0.43  --   --   --  <0.10*  --  0.40  CREATININE  --   --  3.15*  --  3.32*  --   --  3.06*   < > = values in this interval not displayed.     Estimated Creatinine Clearance: 31.7 mL/min (A) (by C-G formula based on SCr of 3.06 mg/dL (H)).   Medical History: Past Medical History:  Diagnosis Date   Arthritis    CAD (coronary artery disease)    NSTEMI 06/2011:  LHC 07/18/11: Proximal diagonal 60%, distal LAD with a diabetic appearance and 60% stenosis, OM2 with an occluded superior branch and an inferior branch with 90%, EF 55% with inferior hypokinesis.  PCI: Promus DES to the OM2 inferior branch.  This vessel provides collaterals to the superior branch which remained occluded.  Echocardiogram 07/18/11: EF 60%, normal wall motion.   Cataract    Essential hypertension, benign    Glaucoma    Hypercholesteremia    Internal hemorrhoids    Noncompliance    Type 2 diabetes mellitus ([place]) 1990    Medications:  Medications Prior to Admission  Medication Sig Dispense Refill Last Dose   amLODipine (NORVASC) 10 MG tablet Take 1 tablet (10 mg total) by mouth daily. 90 tablet 3 09/07/2021   ammonium lactate (AMLACTIN) 12  % lotion Apply to both feet twice daily for dry skin. (Patient taking differently: 1 application as needed for dry skin. Apply to both feet twice daily for dry skin.) 400 g 6 Past Month   aspirin EC 81 MG tablet Take 1 tablet (81 mg total) by mouth daily. 30 tablet 4 09/07/2021   atorvastatin (LIPITOR) 40 MG tablet TAKE 1 TABLET BY MOUTH EVERY DAY (Patient taking differently: Take 40 mg by mouth daily. TAKE 1 TABLET BY MOUTH EVERY DAY) 90 tablet 1 09/07/2021   brimonidine (ALPHAGAN) 0.2 % ophthalmic solution Place 1 drop into the right eye 2 (two) times daily.    09/07/2021   dorzolamide (TRUSOPT) 2 % ophthalmic solution Place 1 drop into the right eye 2 (two) times daily.   09/07/2021   HUMALOG KWIKPEN 100 UNIT/ML KwikPen INJECT 10 UNITS INTO THE SKIN 3 (THREE) TIMES DAILY WITH MEALS. (Patient taking differently: Inject 10 Units into the skin 3 (three) times daily.) 15 mL 3 09/07/2021   hydrALAZINE (APRESOLINE) 50 MG tablet Take 1.5 tablets (75 mg total) by mouth 3 (three) times daily. 135 tablet 6 09/07/2021   insulin glargine (LANTUS SOLOSTAR) 100 UNIT/ML Solostar Pen Inject 38 Units into the  skin at bedtime. 15 mL 6 09/06/2021   Multiple Vitamins-Minerals (MULTIVITAMIN WITH MINERALS) tablet Take 1 tablet by mouth daily.   09/07/2021   RHOPRESSA 0.02 % SOLN Place 1 drop into the right eye at bedtime.   09/06/2021   timolol (TIMOPTIC) 0.5 % ophthalmic solution Place 1 drop into the right eye 2 (two) times daily.   09/07/2021   BD PEN NEEDLE NANO 2ND GEN 32G X 4 MM MISC USE AS DIRECTED 3 TIMES A DAY. PLEASE SCHEDULE AN APPOINTMENT FOR ADDITIONAL REFILLS. 100 each 3    blood glucose meter kit and supplies KIT Dispense based on patient and insurance preference. Use up to four times daily as directed. One Touch Verio 1 each 0    Continuous Blood Gluc Receiver (DEXCOM G6 RECEIVER) DEVI 1 Device by Does not apply route daily. 1 each 0    Continuous Blood Gluc Receiver (FREESTYLE LIBRE 14 DAY READER) DEVI USE AS  DIRECTED 1 each 2    Continuous Blood Gluc Sensor (DEXCOM G6 SENSOR) MISC 1 packet by Does not apply route daily. 3 each 11    Continuous Blood Gluc Sensor (FREESTYLE LIBRE SENSOR SYSTEM) MISC Change sensor Q 2 wks 2 each 12    glucose blood (ACCU-CHEK AVIVA PLUS) test strip Use as instructed for 3 times daily testing of blood sugar. E11.9 100 each 6    glucose monitoring kit (FREESTYLE) monitoring kit 1 each by Does not apply route as needed for other. 1 each 0    Lancets MISC Use as directed.  Accu chek 2 100 each 11    latanoprost (XALATAN) 0.005 % ophthalmic solution Place 1 drop into the right eye at bedtime.      torsemide (DEMADEX) 20 MG tablet TAKE 2 TABLETS (40 MG TOTAL) BY MOUTH EVERY DAY (Patient taking differently: Take 40 mg by mouth daily.) 60 tablet 6     Assessment: 53 YOM w/ h/o PCI/DES stenting in 2012, presents with increasing SOB. Troponin trending up. Pharmacy consulted to start IV heparin for ACS.   Heparin level was therapeutic at 0.44, on 1300 units/hr. 1/23 however Hgb fell 7.4 plt stable 130s . With large drop in hgb, heparin drip was held overnight No bleeding today, HGB stable  Will resume with careful titration and no bolus   Goal of Therapy:  Heparin level 0.3-0.7 units/ml Monitor platelets by anticoagulation protocol: Yes   Plan:  Restart heparin drip 1300 uts/hr  Daily heparin level and CBC  Monitor s/s bleeding     Bonnita Nasuti Pharm.D. CPP, BCPS Clinical Pharmacist [phone] 09/11/2021 2:40 PM    Please check AMION for all [place] phone numbers After 10:00 PM, call [place] [phone]

## 2021-09-11 NOTE — TOC Initial Note (Signed)
Transition of Care [facility]) - Initial/Assessment Note    Patient Details  Name: Derek Lowery MRN: [id number] Date of Birth: 07-Jan-1956  Transition of Care [facility]) CM/SW Contact:    Bethena Roys, RN Phone Number: 09/11/2021, 12:47 PM  Clinical Narrative:  Case Manager spoke with the patient regarding disposition needs and the patient is from home with support of his sister. Patient states his sister drives; however, it is difficult for her when they both have appointments. Case Manager reached out to the CSW to assist with transportation resources outpatient. Patient has durable medical equipment (DME) cane and rolling walker in the home. Patient uses [facility] [address]. PT/OT to consult for recommendations. Case Manager will continue to follow for additional needs.    Expected Discharge Plan: [place] Barriers to Discharge: Continued Medical Work up   Patient Goals and CMS Choice Patient states their goals for this hospitalization and ongoing recovery are:: to return home.      Expected Discharge Plan and Services Expected Discharge Plan: [place] In-house Referral: Clinical Social Work Discharge Planning Services: CM Consult Post Acute Care Choice: [place] arrangements for the past 2 months: [place]                  Prior Living Arrangements/Services Living arrangements for the past 2 months: Single Family Home Lives with:: Siblings Patient language and need for interpreter reviewed:: Yes Do you feel safe going back to the place where you live?: Yes      Need for Family Participation in Patient Care: Yes (Comment) Care giver support system in place?: Yes (comment) Current home services: DME (Patient states he has a cane and rolling walker.) Criminal Activity/Legal Involvement Pertinent to Current Situation/Hospitalization: No - Comment as needed  Activities of Daily Living      Permission  Sought/Granted Permission sought to share information with : Family Supports, Customer service manager, Case Optician, dispensing granted to share information with : Yes, Verbal Permission Granted   Emotional Assessment Appearance:: Appears stated age Attitude/Demeanor/Rapport: Engaged Affect (typically observed): Appropriate Orientation: : Oriented to Situation, Oriented to  Time, Oriented to Place, Oriented to Self Alcohol / Substance Use: Not Applicable Psych Involvement: No (comment)  Admission diagnosis:  Acute on chronic diastolic heart failure (HCC) [I50.33] SOB (shortness of breath) [R06.02] NSTEMI (non-ST elevated myocardial infarction) ([place]) [I21.4] Acute respiratory failure with hypoxia ([place]) [J96.01] Multifocal pneumonia [J18.9] Patient Active Problem List   Diagnosis Date Noted   Acute pulmonary edema ([place])    Non-ST elevation (NSTEMI) myocardial infarction ([place])    Multifocal pneumonia 09/07/2021   Acute on chronic diastolic CHF (congestive heart failure) ([place]) 09/07/2021   Acute on chronic diastolic heart failure ([place]) 09/07/2021   Chronic diastolic CHF (congestive heart failure) ([place]) 06/17/2021   Neovascular glaucoma, right eye, severe stage 06/13/2021   Acute exacerbation of CHF (congestive heart failure) ([place]) 12/24/2020   CKD (chronic kidney disease), stage III ([place]) 12/24/2020   Pneumatouria 06/28/2020   Acute respiratory failure with hypoxia ([place]) 06/28/2020   Pneumonia due to COVID-19 virus 06/28/2020   Proliferative diabetic retinopathy of right eye with macular edema associated with type 2 diabetes mellitus ([place]) 11/30/2019   Rubeosis iridis of right eye 11/30/2019   Glaucoma with pupillary seclusion, unspecified laterality, severe stage 11/30/2019   Trigger middle finger of right hand 10/17/2019   Pain due to onychomycosis of toenails of both feet 08/07/2019   Educated about  COVID-19 virus infection 12/02/2018   RBBB 12/02/2018   Benign paroxysmal  positional vertigo 06/25/2018   Pre-ulcerative corn or callous 10/13/2017   Anemia 10/13/2017   AKI (acute kidney injury) ([place])    Chronic diarrhea 06/09/2017   Microalbuminuria 03/07/2017   Dry skin 03/05/2017   Glaucoma 03/05/2017   Legally blind 03/05/2017   Retinopathy due to secondary diabetes ([place]) 02/06/2016   Diabetic polyneuropathy associated with type 2 diabetes mellitus ([place]) 01/12/2015   Fournier's gangrene into true pelvis s/p I&D 05/05/2014 05/05/2014   Type 2 diabetes mellitus with complication, with long-term current use of insulin ([place]) 07/17/2011   HLD (hyperlipidemia) 07/17/2011   Coronary artery disease of native artery of native heart with stable angina pectoris ([place]) 07/17/2011   Essential hypertension 07/17/2011   PCP:  Ladell Pier, MD [facility]   [id number] [place] [id number]: [phone]: [phone]  Readmission Risk Interventions Readmission Risk Prevention Plan 09/11/2021  Transportation Screening Complete  PCP or Specialist Appt within 3-5 Days Complete  HRI or Home Care Consult Complete  Social Work Consult for RR[place] Park[place]anning/Counseling Complete  Palliative Care Screening Not Applicable  Medication Review (Press photographer Complete  Some recent data might be hidden

## 2021-09-12 ENCOUNTER — Other Ambulatory Visit (HOSPITAL_COMMUNITY): Payer: Self-pay

## 2021-09-12 DIAGNOSIS — N179 Acute kidney failure, unspecified: Secondary | ICD-10-CM | POA: Diagnosis not present

## 2021-09-12 DIAGNOSIS — I5033 Acute on chronic diastolic (congestive) heart failure: Secondary | ICD-10-CM | POA: Diagnosis not present

## 2021-09-12 DIAGNOSIS — J9601 Acute respiratory failure with hypoxia: Secondary | ICD-10-CM | POA: Diagnosis not present

## 2021-09-12 DIAGNOSIS — D62 Acute posthemorrhagic anemia: Secondary | ICD-10-CM

## 2021-09-12 DIAGNOSIS — N183 Chronic kidney disease, stage 3 unspecified: Secondary | ICD-10-CM

## 2021-09-12 LAB — BASIC METABOLIC PANEL
Anion gap: 13 (ref 5–15)
Anion gap: 10 (ref 5–15)
BUN: 74 mg/dL — ABNORMAL HIGH (ref 8–23)
BUN: 71 mg/dL — ABNORMAL HIGH (ref 8–23)
CO2: 33 mmol/L — ABNORMAL HIGH (ref 22–32)
CO2: 33 mmol/L — ABNORMAL HIGH (ref 22–32)
Calcium: 8.3 mg/dL — ABNORMAL LOW (ref 8.9–10.3)
Calcium: 8.2 mg/dL — ABNORMAL LOW (ref 8.9–10.3)
Chloride: 92 mmol/L — ABNORMAL LOW (ref 98–111)
Chloride: 94 mmol/L — ABNORMAL LOW (ref 98–111)
Creatinine, Ser: 3.15 mg/dL — ABNORMAL HIGH (ref 0.61–1.24)
Creatinine, Ser: 2.88 mg/dL — ABNORMAL HIGH (ref 0.61–1.24)
GFR, Estimated: 21 mL/min — ABNORMAL LOW (ref >60)
GFR, Estimated: 24 mL/min — ABNORMAL LOW (ref >60)
Glucose, Bld: 130 mg/dL — ABNORMAL HIGH (ref 70–99)
Glucose, Bld: 209 mg/dL — ABNORMAL HIGH (ref 70–99)
Potassium: 3.6 mmol/L (ref 3.5–5.1)
Potassium: 3.9 mmol/L (ref 3.5–5.1)
Sodium: 138 mmol/L (ref 135–145)
Sodium: 137 mmol/L (ref 135–145)

## 2021-09-12 LAB — CBC
HCT: 23.3 % — ABNORMAL LOW (ref 39.0–52.0)
Hemoglobin: 7.4 g/dL — ABNORMAL LOW (ref 13.0–17.0)
MCH: 27.6 pg (ref 26.0–34.0)
MCHC: 31.8 g/dL (ref 30.0–36.0)
MCV: 86.9 fL (ref 80.0–100.0)
Platelets: 176 10*3/uL (ref 150–400)
RBC: 2.68 MIL/uL — ABNORMAL LOW (ref 4.22–5.81)
RDW: 14 % (ref 11.5–15.5)
WBC: 7 10*3/uL (ref 4.0–10.5)
nRBC: 0 % (ref 0.0–0.2)

## 2021-09-12 LAB — GLUCOSE, CAPILLARY
Glucose-Capillary: 119 mg/dL — ABNORMAL HIGH (ref 70–99)
Glucose-Capillary: 128 mg/dL — ABNORMAL HIGH (ref 70–99)
Glucose-Capillary: 161 mg/dL — ABNORMAL HIGH (ref 70–99)
Glucose-Capillary: 168 mg/dL — ABNORMAL HIGH (ref 70–99)
Glucose-Capillary: 175 mg/dL — ABNORMAL HIGH (ref 70–99)
Glucose-Capillary: 195 mg/dL — ABNORMAL HIGH (ref 70–99)

## 2021-09-12 LAB — COOXEMETRY PANEL
Carboxyhemoglobin: 1.3 % (ref 0.5–1.5)
Methemoglobin: 1 % (ref 0.0–1.5)
O2 Saturation: 63.4 %
Total hemoglobin: 10.3 g/dL — ABNORMAL LOW (ref 12.0–16.0)

## 2021-09-12 LAB — MAGNESIUM: Magnesium: 2.5 mg/dL — ABNORMAL HIGH (ref 1.7–2.4)

## 2021-09-12 LAB — HEPARIN LEVEL (UNFRACTIONATED): Heparin Unfractionated: 0.44 IU/mL (ref 0.30–0.70)

## 2021-09-12 MED ORDER — POTASSIUM CHLORIDE CRYS ER 20 MEQ PO TBCR
40.0000 meq | EXTENDED_RELEASE_TABLET | Freq: Every day | ORAL | Status: DC
  Administered 2021-09-12 – 2021-09-16 (×5): 40 meq via ORAL
  Filled 2021-09-12 (×5): qty 2

## 2021-09-12 MED ORDER — INSULIN GLARGINE-YFGN 100 UNIT/ML ~~LOC~~ SOLN
10.0000 [IU] | Freq: Every day | SUBCUTANEOUS | Status: DC
  Administered 2021-09-12 – 2021-09-15 (×4): 10 [IU] via SUBCUTANEOUS
  Filled 2021-09-12 (×7): qty 0.1

## 2021-09-12 MED ORDER — ALBUTEROL SULFATE (2.5 MG/3ML) 0.083% IN NEBU
2.5000 mg | INHALATION_SOLUTION | Freq: Two times a day (BID) | RESPIRATORY_TRACT | Status: DC
  Administered 2021-09-13 (×2): 2.5 mg via RESPIRATORY_TRACT
  Filled 2021-09-12 (×2): qty 3

## 2021-09-12 MED ORDER — INSULIN ASPART 100 UNIT/ML IJ SOLN
0.0000 [IU] | Freq: Three times a day (TID) | INTRAMUSCULAR | Status: DC
  Administered 2021-09-12 – 2021-09-13 (×4): 2 [IU] via SUBCUTANEOUS
  Administered 2021-09-13 – 2021-09-14 (×2): 3 [IU] via SUBCUTANEOUS
  Administered 2021-09-14: 5 [IU] via SUBCUTANEOUS
  Administered 2021-09-14 – 2021-09-15 (×3): 2 [IU] via SUBCUTANEOUS
  Administered 2021-09-15 (×2): 3 [IU] via SUBCUTANEOUS
  Administered 2021-09-15 – 2021-09-16 (×2): 2 [IU] via SUBCUTANEOUS
  Administered 2021-09-16: 3 [IU] via SUBCUTANEOUS

## 2021-09-12 MED ORDER — ENOXAPARIN SODIUM 40 MG/0.4ML IJ SOSY
40.0000 mg | PREFILLED_SYRINGE | INTRAMUSCULAR | Status: DC
  Administered 2021-09-13 – 2021-09-16 (×4): 40 mg via SUBCUTANEOUS
  Filled 2021-09-12 (×4): qty 0.4

## 2021-09-12 NOTE — Progress Notes (Signed)
Heart Failure Nurse Navigator Progress Note  Attempted to interview to assess for HV TOC readiness. Pt working with nursing staff to change bedding. Per chart-respiratory status improving, down to 8LLPM HF [place]. Volume status still high--cont lasix gtt.   Will attempt to interview later as schedule allows.   Pricilla Holm, MSN, RN Heart Failure Nurse Navigator [phone]

## 2021-09-12 NOTE — Evaluation (Signed)
Physical Therapy Evaluation Patient Details Name: Derek Lowery MRN: [id number] DOB: [birth year] Today's Date: 09/12/2021  History of Present Illness  Pt is a 66 y/o male admitted 1/21 due to multifocal PNA and NSTEMI. Pt with increasing supplemental O2 needs and transferred to ICU 1/22. PMH: CHF, CAD, HTN, DM, HLD, glaucoma.  Clinical Impression  Pt admitted with above diagnosis. Pt was able to ambulate with RW and needed 8LO2 HFNC to keep sats >90% today with DOE 3/4.  Pt should progress well and is very motivated to go home.  Pt currently with functional limitations due to the deficits listed below (see PT Problem List). Pt will benefit from skilled PT to increase their independence and safety with mobility to allow discharge to the venue listed below.          Recommendations for follow up therapy are one component of a multi-disciplinary discharge planning process, led by the attending physician.  Recommendations may be updated based on patient status, additional functional criteria and insurance authorization.  Follow Up Recommendations Home health PT    Assistance Recommended at Discharge Intermittent Supervision/Assistance  Patient can return home with the following  A little help with walking and/or transfers;A little help with bathing/dressing/bathroom    Equipment Recommendations None recommended by PT  Recommendations for Other Services       Functional Status Assessment Patient has had a recent decline in their functional status and demonstrates the ability to make significant improvements in function in a reasonable and predictable amount of time.     Precautions / Restrictions Precautions Precautions: Fall;Other (comment) Precaution Comments: monitor O2 (does not wear at baseline) Restrictions Weight Bearing Restrictions: No      Mobility  Bed Mobility Overal bed mobility: Needs Assistance Bed Mobility: Supine to Sit     Supine to sit: Min guard           Transfers Overall transfer level: Needs assistance Equipment used: Rolling walker (2 wheels) Transfers: Sit to/from Stand Sit to Stand: Min guard           General transfer comment: Did not need assist but did need cues for hand placement and technique    Ambulation/Gait Ambulation/Gait assistance: Min guard, Min assist Gait Distance (Feet): 160 Feet Assistive device: Rolling walker (2 wheels) Gait Pattern/deviations: Step-through pattern, Decreased stride length, Drifts right/left, Trunk flexed   Gait velocity interpretation: <1.8 ft/sec, indicate of risk for recurrent falls   General Gait Details: Pt needed cues to stay close to RW at times as well as needed cues to stand tall. Pt was able to use the RW today as well.  Pt did need 8LO2 HFNC with activity to keep sats >90%.  Needed several standing rest breaks as well due to DOE 3/4 at times.  Stairs            Wheelchair Mobility    Modified Rankin (Stroke Patients Only)       Balance Overall balance assessment: Needs assistance Sitting-balance support: No upper extremity supported, Feet supported Sitting balance-Leahy Scale: Fair     Standing balance support: No upper extremity supported, During functional activity, Bilateral upper extremity supported Standing balance-Leahy Scale: Poor Standing balance comment: fair static standing with UE support needed                             Pertinent Vitals/Pain Pain Assessment Pain Assessment: Faces Faces Pain Scale: Hurts a little bit Pain Location: back  Pain Descriptors / Indicators: Aching, Grimacing, Guarding Pain Intervention(s): Limited activity within patient's tolerance, Monitored during session, Repositioned    Home Living Family/patient expects to be discharged to:: Private residence Living Arrangements: Other relatives (sister and nephew) Available Help at Discharge: Family;Available 24 hours/day Type of Home: Mobile home Home Access:  Stairs to enter Entrance Stairs-Rails: Right Entrance Stairs-Number of Steps: 4   Home Layout: One level Home Equipment: Conservation officer, nature (2 wheels);Cane - single point;Tub bench;Hand held shower head Additional Comments: Reports his sister with recent hospitalization due to COPD with new O2 requirements. pt's nephew also lives at their home but works.    Prior Function Prior Level of Function : Needs assist       Physical Assist : ADLs (physical)   ADLs (physical): IADLs Mobility Comments: used cane in house and walker out of house ADLs Comments: Use of tub bench for showering tasks, Mod I for ADLs. nephew does 85 of IADLs     Hand Dominance   Dominant Hand: Right    Extremity/Trunk Assessment   Upper Extremity Assessment Upper Extremity Assessment: Defer to OT evaluation    Lower Extremity Assessment Lower Extremity Assessment: Generalized weakness    Cervical / Trunk Assessment Cervical / Trunk Assessment: Normal (rounded shoulders)  Communication   Communication: No difficulties  Cognition Arousal/Alertness: Awake/alert Behavior During Therapy: WFL for tasks assessed/performed Overall Cognitive Status: Within Functional Limits for tasks assessed                                 General Comments: likely at baseline        General Comments      Exercises General Exercises - Lower Extremity Ankle Circles/Pumps: AROM, Both, 10 reps, Seated Long Arc Quad: AROM, Both, 10 reps, Seated Hip Flexion/Marching: AROM, Both, 10 reps, Seated   Assessment/Plan    PT Assessment Patient needs continued PT services  PT Problem List Decreased activity tolerance;Decreased balance;Decreased mobility;Decreased knowledge of use of DME;Decreased safety awareness;Cardiopulmonary status limiting activity       PT Treatment Interventions DME instruction;Gait training;Functional mobility training;Therapeutic activities;Therapeutic exercise;Balance  training;Patient/family education;Stair training    PT Goals (Current goals can be found in the Care Plan section)  Acute Rehab PT Goals Patient Stated Goal: to go home PT Goal Formulation: With patient Time For Goal Achievement: 09/26/21 Potential to Achieve Goals: Fair    Frequency Min 3X/week     Co-evaluation               AM-PAC PT "6 Clicks" Mobility  Outcome Measure Help needed turning from your back to your side while in a flat bed without using bedrails?: A Little Help needed moving from lying on your back to sitting on the side of a flat bed without using bedrails?: A Little Help needed moving to and from a bed to a chair (including a wheelchair)?: A Little Help needed standing up from a chair using your arms (e.g., wheelchair or bedside chair)?: A Little Help needed to walk in hospital room?: A Little Help needed climbing 3-5 steps with a railing? : A Little 6 Click Score: 18    End of Session Equipment Utilized During Treatment: Gait belt Activity Tolerance: Patient limited by fatigue Patient left: with call bell/phone within reach;in bed (with OT who was going to see pt after PT) Nurse Communication: Mobility status PT Visit Diagnosis: Unsteadiness on feet (R26.81);Muscle weakness (generalized) (M62.81)  Time: 1000-1027 PT Time Calculation (min) (ACUTE ONLY): 27 min   Charges:   PT Evaluation $PT Eval Moderate Complexity: 1 Mod PT Treatments $Gait Training: 8-22 mins        Sherrie Marsan M,PT Acute Rehab Services [phone] (pager)   Derek Lowery 09/12/2021, 1:32 PM

## 2021-09-12 NOTE — TOC Benefit Eligibility Note (Signed)
Patient Teacher, English as a foreign language completed.    The patient is currently admitted and upon discharge could be taking Farxiga 10 mg.  The current 30 day co-pay is, $0.00.   The patient is currently admitted and upon discharge could be taking Jardiance 10 mg.  The current 30 day co-pay is, $0.00.   The patient is insured through [place] Patient Advocate Specialist [place] Patient Advocate Team Direct Number: [phone]  Fax: [phone]

## 2021-09-12 NOTE — Progress Notes (Signed)
ANTICOAGULATION CONSULT NOTE  Pharmacy Consult for heparin Indication: chest pain/ACS  Allergies  Allergen Reactions   Coreg [Carvedilol] Other (See Comments)    Redness and swelling of calf    Patient Measurements: Height: _0  (177.8 cm) Weight: 114.8 kg (253 lb 1.4 oz) IBW/kg (Calculated) : 73 Heparin Dosing Weight: 97.8 kg   Vital Signs: Temp: 97.8 F (36.6 C) (01/26 1100) BP: 124/72 (01/26 1200) Pulse Rate: 64 (01/26 1200)  Labs: Recent Labs    09/10/21 0346 09/10/21 2012 09/11/21 0058 09/11/21 0516 09/12/21 0250  HGB 8.3* 7.4* 7.1* 7.6* 7.4*  HCT 25.3* 22.3* 21.2* 23.3* 23.3*  PLT 157  --   --  161 176  HEPARINUNFRC  --  <0.10*  --  0.40 0.44  CREATININE 3.32*  --   --  3.06* 3.15*     Estimated Creatinine Clearance: 30.5 mL/min (A) (by C-G formula based on SCr of 3.15 mg/dL (H)).   Medical History: Past Medical History:  Diagnosis Date   Arthritis    CAD (coronary artery disease)    NSTEMI 06/2011:  LHC 07/18/11: Proximal diagonal 60%, distal LAD with a diabetic appearance and 60% stenosis, OM2 with an occluded superior branch and an inferior branch with 90%, EF 55% with inferior hypokinesis.  PCI: Promus DES to the OM2 inferior branch.  This vessel provides collaterals to the superior branch which remained occluded.  Echocardiogram 07/18/11: EF 60%, normal wall motion.   Cataract    Essential hypertension, benign    Glaucoma    Hypercholesteremia    Internal hemorrhoids    Noncompliance    Type 2 diabetes mellitus ([place]) 1990    Medications:  Medications Prior to Admission  Medication Sig Dispense Refill Last Dose   amLODipine (NORVASC) 10 MG tablet Take 1 tablet (10 mg total) by mouth daily. 90 tablet 3 09/07/2021   ammonium lactate (AMLACTIN) 12 % lotion Apply to both feet twice daily for dry skin. (Patient taking differently: 1 application as needed for dry skin. Apply to both feet twice daily for dry skin.) 400 g 6 Past Month   aspirin EC 81  MG tablet Take 1 tablet (81 mg total) by mouth daily. 30 tablet 4 09/07/2021   atorvastatin (LIPITOR) 40 MG tablet TAKE 1 TABLET BY MOUTH EVERY DAY (Patient taking differently: Take 40 mg by mouth daily. TAKE 1 TABLET BY MOUTH EVERY DAY) 90 tablet 1 09/07/2021   brimonidine (ALPHAGAN) 0.2 % ophthalmic solution Place 1 drop into the right eye 2 (two) times daily.    09/07/2021   dorzolamide (TRUSOPT) 2 % ophthalmic solution Place 1 drop into the right eye 2 (two) times daily.   09/07/2021   HUMALOG KWIKPEN 100 UNIT/ML KwikPen INJECT 10 UNITS INTO THE SKIN 3 (THREE) TIMES DAILY WITH MEALS. (Patient taking differently: Inject 10 Units into the skin 3 (three) times daily.) 15 mL 3 09/07/2021   hydrALAZINE (APRESOLINE) 50 MG tablet Take 1.5 tablets (75 mg total) by mouth 3 (three) times daily. 135 tablet 6 09/07/2021   insulin glargine (LANTUS SOLOSTAR) 100 UNIT/ML Solostar Pen Inject 38 Units into the skin at bedtime. 15 mL 6 09/06/2021   Multiple Vitamins-Minerals (MULTIVITAMIN WITH MINERALS) tablet Take 1 tablet by mouth daily.   09/07/2021   RHOPRESSA 0.02 % SOLN Place 1 drop into the right eye at bedtime.   09/06/2021   timolol (TIMOPTIC) 0.5 % ophthalmic solution Place 1 drop into the right eye 2 (two) times daily.   09/07/2021   BD  PEN NEEDLE NANO 2ND GEN 32G X 4 MM MISC USE AS DIRECTED 3 TIMES A DAY. PLEASE SCHEDULE AN APPOINTMENT FOR ADDITIONAL REFILLS. 100 each 3    blood glucose meter kit and supplies KIT Dispense based on patient and insurance preference. Use up to four times daily as directed. One Touch Verio 1 each 0    Continuous Blood Gluc Receiver (DEXCOM G6 RECEIVER) DEVI 1 Device by Does not apply route daily. 1 each 0    Continuous Blood Gluc Receiver (FREESTYLE LIBRE 14 DAY READER) DEVI USE AS DIRECTED 1 each 2    Continuous Blood Gluc Sensor (DEXCOM G6 SENSOR) MISC 1 packet by Does not apply route daily. 3 each 11    Continuous Blood Gluc Sensor (FREESTYLE LIBRE SENSOR SYSTEM) MISC Change  sensor Q 2 wks 2 each 12    glucose blood (ACCU-CHEK AVIVA PLUS) test strip Use as instructed for 3 times daily testing of blood sugar. E11.9 100 each 6    glucose monitoring kit (FREESTYLE) monitoring kit 1 each by Does not apply route as needed for other. 1 each 0    Lancets MISC Use as directed.  Accu chek 2 100 each 11    latanoprost (XALATAN) 0.005 % ophthalmic solution Place 1 drop into the right eye at bedtime.      torsemide (DEMADEX) 20 MG tablet TAKE 2 TABLETS (40 MG TOTAL) BY MOUTH EVERY DAY (Patient taking differently: Take 40 mg by mouth daily.) 60 tablet 6     Assessment: 10 YOM w/ h/o PCI/DES stenting in 2012, presents with increasing SOB. Troponin trending up. Pharmacy consulted to start IV heparin for ACS.   Heparin level therapeutic at 0.44, on Heparin drip 1450 units/hr - resumed yesterday and hgb stable 7.4  plt stable 170s . No bleeding today, - s/p 4 days on heparin after admit for possible NSTEMI - no cath lab currently with Cr 3 Discussed with MD will stop infusion and change to enoxaparin for VTE px    Goal of Therapy:  Heparin level 0.3-0.7 units/ml Monitor platelets by anticoagulation protocol: Yes   Plan:  Stop heparin  Enoxaparin 99m daily Monitor s/s bleeding     LBonnita NasutiPharm.D. CPP, BCPS Clinical Pharmacist [phone]/26/2023 12:28 PM    Please check AMION for all [place] numbers After 10:00 PM, call [phone]

## 2021-09-12 NOTE — Progress Notes (Signed)
NAME:  Derek Lowery, MRN:  [id number], DOB:  Jul 23, 1956, LOS: 5 ADMISSION DATE:  09/07/2021, CONSULTATION DATE: 09/08/2021 REFERRING MD: Wynelle Cleveland - [facility] CHIEF COMPLAINT: Pneumonia, NSTEMI  History of Present Illness:   65 year old man who presented to [facility] 1/21 with cough, chest pain.  Found to have multifocal pneumonia with NSTEMI. Started on Rocephin, azithromycin, heparin drip. PMHx significant for HTN, HLD, T2DM, CAD, HFpEF (Echo 5/22 with EF 60-65%), glaucoma with legal blindness.   PCCM consulted on 1/22 for worsening dyspnea, hypoxia.  Pertinent Medical History:   Past Medical History:  Diagnosis Date   Arthritis    CAD (coronary artery disease)    NSTEMI 06/2011:  LHC 07/18/11: Proximal diagonal 60%, distal LAD with a diabetic appearance and 60% stenosis, OM2 with an occluded superior branch and an inferior branch with 90%, EF 55% with inferior hypokinesis.  PCI: Promus DES to the OM2 inferior branch.  This vessel provides collaterals to the superior branch which remained occluded.  Echocardiogram 07/18/11: EF 60%, normal wall motion.   Cataract    Essential hypertension, benign    Glaucoma    Hypercholesteremia    Internal hemorrhoids    Noncompliance    Type 2 diabetes mellitus ([place]) [place] [facility] Events: Including procedures, antibiotic start and stop dates in addition to other pertinent events   1/21- Admit, 1/21 CT chest > CT chest with patchy areas of consolidation, groundglass opacity and pulmonary edema 1/23 still on bipap, breaks on HHFNC, no improvement on FiO2, ongoing lasix gtt.  Stopped heparin given Hgb drop, CVP 21 1/24 increased lasix gtt, metolazone and albumin, sCr up, remains on HHFNC / BiPAP, heparin restarted no bolus, Hgb stable 1/25 Lasix gtt continues at 15, metolazone 28m given. 1/26 Good diuresis with Lasix gtt, metolazone yesterday, -3.8L/24H. Decreasing respiratory/O2 needs. CVP 12-13  Interim History / Subjective:  Feeling better  today, in good spirits Breathing is improved C/o back and hip pain, feels 2/2 bed Motivated to get OOB, PT consulted Good diuresis with Lasix gtt/metolazone yesterday -3.8L/24H, CVP remains 12-13  Objective   Blood pressure 129/68, pulse 70, temperature 98.1 F (36.7 C), temperature source Oral, resp. rate 18, height 5' 10"  (1.778 m), weight 114.8 kg, SpO2 94 %. CVP:  [12 mmHg-31 mmHg] 19 mmHg  FiO2 (%):  [50 %-65 %] 50 %   Intake/Output Summary (Last 24 hours) at 09/12/2021 0748 Last data filed at 09/12/2021 [id number] per 24 hour  Intake 1359.69 ml  Output 5175 ml  Net -3815.31 ml    Filed Weights   09/10/21 0500 09/11/21 0500 09/12/21 0457  Weight: 119 kg 117.2 kg 114.8 kg   Physical Examination: General: Acute-on-chronically ill-appearing older man in NAD. Sitting up in bed, pleasant and conversant. HEENT: [place]/AT, anicteric sclera, PERRL, moist mucous membranes. [place]. Neuro: Awake, oriented x 4. Responds to verbal stimuli. Following commands consistently. Moves all 4 extremities spontaneously. CV: RRR, no m/g/r. PULM: Breathing even and unlabored on HHFNC (15L, 50% FiO2). Lung fields with bibasilar crackles. GI: Soft, nontender, nondistended. Normoactive bowel sounds. Extremities: Bilateral symmetric 2+ pitting LE edema noted. Bilateral ace bandage wraps in place. Skin: Warm/dry, no rashes.  TTE 1/22 > EF 30-35%, mild global HK with more severe HK in inferolateral, anterolateral, and apical myocardium, G2DD, mildly reduced RV, normal PASP, est RA pressure of 8  (TTE 5/22 with EF 60-65%, no RWA, G2DD, nromal RV)  Resolved Hospital Problem List:     Assessment & Plan:  Acute  hypoxic respiratory failure secondary to multilobar pneumonia and pulmonary edema - Continue supplemental O2 support with HHFNC - BiPAP QHS + PRN - Wean FiO2 for O2 sat > 90% - Bronchodilators PRN - Aggressive pulmonary hygiene (IS/flutter, chest PT, mobilization) - F/u finalized  respiratory Cx - Diuresis as tolerated, goal CVP 8-12  Non-ST elevation MI Acute systolic on chronic diastolic heart failure Hypertension HLD Echo 1/22 with focal wall motion abnormality and new systolic HF (decreased EF 30-35%). - Cardiology following, appreciate assistance - Continue heparin gtt - Eventual LHC when stable from a respiratory standpoint, given decreased EF/Echo changes - Goal MAP > 65 - Trend Co-ox - Trend CVP, goal 8-12 - Aggressive diuresis with Lasix gtt - Optimize electrolytes; K > 4, Mg > 2 - Continue ASA, statin - Continue Hydralazine, Imdur, Coreg (given new HFrEF)  AKI on CKD3 Renal US negative for structural abnormalities/hydro. - Trend BMP - Replete electrolytes as indicated - Daily weights, monitor I&Os with diuresis - Avoid nephrotoxic agents as able - Ensure adequate renal perfusion  Diabetes - SSI - Goal CBG 140-180  Acute on chronic normocytic anemia - Trend H&H, monitor on heparin - Transfuse for Hgb < 7, consider goal 8 in the setting of NSTEMI/new HF  Best Practice: (right click and "Reselect all SmartList Selections" daily)   Diet/type: Regular consistency (see orders) DVT prophylaxis: systemic heparin GI prophylaxis: PPI Lines: Central line - RUE PICC Foley:  Yes, and it is still needed Code Status:  full code Last date of multidisciplinary goals of care discussion []  ongoing.   Critical care time: N/A   Rhae Lerner [facility] [place] 09/12/21 7:49 AM  Please see [email or web address] for pager details.  From 7A-7P if no response, please call [phone] After hours, please call ELink [phone]

## 2021-09-12 NOTE — Evaluation (Signed)
Occupational Therapy Evaluation Patient Details Name: Derek Lowery MRN: [id number] DOB: 1956-01-16 Today's Date: 09/12/2021   History of Present Illness Pt is a 66 y/o male admitted 1/21 due to multifocal PNA and NSTEMI. Pt with increasing supplemental O2 needs and transferred to ICU 1/22. PMH: CHF, CAD, HTN, DM, HLD, glaucoma.   Clinical Impression   PTA, pt lives with family and reports Modified Independent with ADLs and mobility (cane inside home, RW outside home). Pt presents now with deficits in cardiopulmonary tolerance (with new supplemental O2 needs), dynamic standing balance and overall strength. Pt able to mobilize using RW with min guard at most though noted to be tremulous when standing unsupported for ADLs. Pt requires Setup for UB ADLs and Min A for LB ADLs due to deficits. Anticipate pt to make quick progress to return to ADL independence without OT follow-up at DC. Will continue to follow to progress ADL/mobility endurance, educate on UE HEP and reinforce energy conservation.  SpO2 97% on 8 L HFNC      Recommendations for follow up therapy are one component of a multi-disciplinary discharge planning process, led by the attending physician.  Recommendations may be updated based on patient status, additional functional criteria and insurance authorization.   Follow Up Recommendations  No OT follow up    Assistance Recommended at Discharge Intermittent Supervision/Assistance  Patient can return home with the following Assistance with cooking/housework;Assist for transportation;Help with stairs or ramp for entrance    Functional Status Assessment  Patient has had a recent decline in their functional status and demonstrates the ability to make significant improvements in function in a reasonable and predictable amount of time.  Equipment Recommendations  None recommended by OT    Recommendations for Other Services       Precautions / Restrictions  Precautions Precautions: Fall;Other (comment) Precaution Comments: monitor O2 (does not wear at baseline) Restrictions Weight Bearing Restrictions: No      Mobility Bed Mobility Overal bed mobility: Needs Assistance Bed Mobility: Sit to Supine       Sit to supine: Min guard   General bed mobility comments: some difficulty getting BLE up but able to do so without assist    Transfers Overall transfer level: Needs assistance Equipment used: Rolling walker (2 wheels) Transfers: Sit to/from Stand Sit to Stand: Min guard                  Balance Overall balance assessment: Needs assistance Sitting-balance support: No upper extremity supported, Feet supported Sitting balance-Leahy Scale: Fair     Standing balance support: No upper extremity supported, During functional activity, Bilateral upper extremity supported Standing balance-Leahy Scale: Fair Standing balance comment: fair static standing at sink though tremulous with fatigue, BUE for mobility for energy conservation/stability                           ADL either performed or assessed with clinical judgement   ADL Overall ADL's : Needs assistance/impaired Eating/Feeding: Independent;Sitting   Grooming: Supervision/safety;Standing;Oral care;Wash/dry face Grooming Details (indicate cue type and reason): some tremors noted with fatigue Upper Body Bathing: Set up;Sitting   Lower Body Bathing: Minimal assistance;Sit to/from stand   Upper Body Dressing : Set up;Sitting   Lower Body Dressing: Minimal assistance;Sit to/from stand Lower Body Dressing Details (indicate cue type and reason): difficulty to safely bend to feet as pt would do at home from high bed (in lowest position) Toilet Transfer: Ambulation;Rolling walker (2 wheels);Min  guard   Toileting- Water quality scientist and Hygiene: Min guard;Sit to/from stand       Functional mobility during ADLs: Min guard;Rolling walker (2 wheels) General  ADL Comments: Pt with new O2 needs with decreased overall endurance for completion of ADL routine without light assist. began education on energy conservation     Vision Baseline Vision/History: 2 Legally blind;3 Glaucoma Ability to See in Adequate Light: 1 Impaired Patient Visual Report: No change from baseline Vision Assessment?: No apparent visual deficits     Perception     Praxis      Pertinent Vitals/Pain Pain Assessment Pain Assessment: Faces Faces Pain Scale: Hurts a little bit Pain Location: back Pain Intervention(s): Monitored during session, Limited activity within patient's tolerance, Repositioned     Hand Dominance Right   Extremity/Trunk Assessment Upper Extremity Assessment Upper Extremity Assessment: Generalized weakness   Lower Extremity Assessment Lower Extremity Assessment: Defer to PT evaluation   Cervical / Trunk Assessment Cervical / Trunk Assessment: Normal (rounded shoulders)   Communication Communication Communication: No difficulties   Cognition Arousal/Alertness: Awake/alert Behavior During Therapy: WFL for tasks assessed/performed Overall Cognitive Status: Within Functional Limits for tasks assessed                                 General Comments: likely at baseline     General Comments       Exercises     Shoulder Instructions      Home Living Family/patient expects to be discharged to:: Private residence Living Arrangements: Other relatives (sister and nephew) Available Help at Discharge: Family;Available 24 hours/day Type of Home: Mobile home Home Access: Stairs to enter Entrance Stairs-Number of Steps: 4 Entrance Stairs-Rails: Right Home Layout: One level     Bathroom Shower/Tub: Teacher, early years/pre: Standard     Home Equipment: Conservation officer, nature (2 wheels);Cane - single point;Tub bench;Hand held shower head   Additional Comments: Reports his sister with recent hospitalization due to COPD  with new O2 requirements. pt's nephew also lives at their home but works.      Prior Functioning/Environment Prior Level of Function : Needs assist       Physical Assist : ADLs (physical)   ADLs (physical): IADLs Mobility Comments: used cane in house and walker out of house ADLs Comments: Use of tub bench for showering tasks, Mod I for ADLs. nephew does 20 of IADLs        OT Problem List: Decreased strength;Decreased activity tolerance;Impaired balance (sitting and/or standing);Cardiopulmonary status limiting activity      OT Treatment/Interventions: Self-care/ADL training;Therapeutic exercise;Energy conservation;DME and/or AE instruction;Therapeutic activities;Patient/family education;Balance training    OT Goals(Current goals can be found in the care plan section) Acute Rehab OT Goals Patient Stated Goal: decrease O2 needs, go home when ready OT Goal Formulation: With patient Time For Goal Achievement: 09/19/21 Potential to Achieve Goals: Good ADL Goals Pt Will Perform Lower Body Dressing: with modified independence;sitting/lateral leans;sit to/from stand Pt Will Transfer to Toilet: with modified independence;ambulating Pt/caregiver will Perform Home Exercise Program: Increased strength;Both right and left upper extremity;With theraband;Independently;With written HEP provided Additional ADL Goal #1: Pt to verbalize at least 3 energy conservation strategies to implement during ADL routine at home Additional ADL Goal #2: Pt to increase activity tolerance > 7 min to maximize endurance for daily tasks  OT Frequency: Min 2X/week    Co-evaluation  AM-PAC OT "6 Clicks" Daily Activity     Outcome Measure Help from another person eating meals?: None Help from another person taking care of personal grooming?: A Little Help from another person toileting, which includes using toliet, bedpan, or urinal?: A Little Help from another person bathing (including  washing, rinsing, drying)?: A Little Help from another person to put on and taking off regular upper body clothing?: A Little Help from another person to put on and taking off regular lower body clothing?: A Little 6 Click Score: 19   End of Session Equipment Utilized During Treatment: Gait belt;Rolling walker (2 wheels);Oxygen Nurse Communication: Mobility status  Activity Tolerance: Patient tolerated treatment well Patient left: in bed;with call bell/phone within reach;with bed alarm set  OT Visit Diagnosis: Unsteadiness on feet (R26.81);Muscle weakness (generalized) (M62.81)                Time: 9030-0923 OT Time Calculation (min): 22 min Charges:  OT General Charges $OT Visit: 1 Visit OT Evaluation $OT Eval Moderate Complexity: 1 Mod  Malachy Chamber, OTR/L Acute Rehab Services Office: [phone]   Layla Maw 09/12/2021, 11:49 AM

## 2021-09-12 NOTE — Progress Notes (Signed)
Progress Note  Patient Name: Derek Lowery Date of Encounter: 09/12/2021  [facility] Cardiologist: Skeet Latch, MD   Subjective   Feeling much better.  Has some pain in his back.  Inpatient Medications    Scheduled Meds:  albuterol  2.5 mg Nebulization TID   aspirin EC  81 mg Oral Daily   atorvastatin  40 mg Oral Daily   azithromycin  500 mg Oral Daily   brimonidine  1 drop Right Eye BID   carvedilol  12.5 mg Oral BID WC   Chlorhexidine Gluconate Cloth  6 each Topical Daily   dorzolamide  1 drop Right Eye BID   guaiFENesin  600 mg Oral BID   hydrALAZINE  100 mg Oral TID   insulin aspart  0-9 Units Subcutaneous Q4H   isosorbide mononitrate  60 mg Oral Daily   Netarsudil Dimesylate  1 drop Right Eye QHS   pantoprazole  40 mg Oral BID   senna  1 tablet Oral BID   sodium chloride flush  10-40 mL Intracatheter Q12H   timolol  1 drop Right Eye BID   Continuous Infusions:  sodium chloride Stopped (09/08/21 1437)   cefTRIAXone (ROCEPHIN)  IV Stopped (09/11/21 1634)   furosemide (LASIX) 200 mg in dextrose 5% 100 mL (64m/mL) infusion 15 mg/hr (09/12/21 06314   heparin 1,450 Units/hr (09/12/21 09702   PRN Meds: sodium chloride, acetaminophen **OR** acetaminophen, albuterol, ammonium lactate, docusate sodium, ondansetron (ZOFRAN) IV, polyethylene glycol, sodium chloride flush, traMADol   Vital Signs    Vitals:   09/12/21 0545 09/12/21 0600 09/12/21 0700 09/12/21 0818  BP:  130/65 129/68 135/66  Pulse: 65 61 70 67  Resp: 17 17 18 18   Temp:      TempSrc:      SpO2: 96% 96% 94% 98%  Weight:      Height:        Intake/Output Summary (Last 24 hours) at 09/12/2021 0915 Last data filed at 09/12/2021 06378Gross per 24 hour  Intake 1108.79 ml  Output 4575 ml  Net -3466.21 ml   Last 3 Weights 09/12/2021 09/11/2021 09/10/2021  Weight (lbs) 253 lb 1.4 oz 258 lb 6.1 oz 262 lb 5.6 oz  Weight (kg) 114.8 kg 117.2 kg 119 kg      Telemetry    Sinus rhythm.  Frequent  PVCs.- Personally Reviewed  ECG    Sinus rhythm.  Rate 94 bpm.  Right bundle branch block.  Left posterior fascicular block. - Personally Reviewed  Physical Exam   VS:  BP 135/66    Pulse 67    Temp 98.1 F (36.7 C) (Oral)    Resp 18    Ht 5' 10"  (1.778 m)    Wt 114.8 kg    SpO2 98%    BMI 36.31 kg/m  , BMI Body mass index is 36.31 kg/m. GENERAL: Critically ill-appearing.  Mild respiratory distress on high flow oxygen. HEENT: Pupils equal round and reactive, fundi not visualized, oral mucosa unremarkable NECK: + jugular venous distention, waveform within normal limits, carotid upstroke brisk and symmetric, no bruits, no thyromegaly LUNGS: Coarse breath with rhonchi and wheezing bilaterally. HEART:  RRR.  PMI not displaced or sustained,S1 and S2 within normal limits, no S3, no S4, no clicks, no rubs, no murmurs ABD:  Flat, positive bowel sounds normal in frequency in pitch, no bruits, no rebound, no guarding, no midline pulsatile mass, no hepatomegaly, no splenomegaly EXT:  2 plus pulses throughout, 2 +woody lower extremity edema  bilaterally. Improving significantly.  Unna boots bilaterally, no cyanosis no clubbing SKIN:  No rashes no nodules NEURO:  Cranial nerves II through XII grossly intact, motor grossly intact throughout PSYCH:  Cognitively intact, oriented to person place and time   Labs    High Sensitivity Troponin:   Recent Labs  Lab 09/07/21 1255 09/07/21 1757 09/07/21 2110 09/08/21 0217 09/08/21 0556  TROPONINIHS 141* 333* 714* 2,512* 4,145*     Chemistry Recent Labs  Lab 09/07/21 1030 09/08/21 0217 09/09/21 0405 09/09/21 1138 09/10/21 0346 09/11/21 0516 09/12/21 0250  NA 139   < >  --    < > 138 136 138  K 3.5   < >  --    < > 3.8 3.2* 3.6  CL 102   < >  --    < > 99 95* 92*  CO2 27   < >  --    < > 28 30 33*  GLUCOSE 165*   < >  --    < > 102* 122* 130*  BUN 37*   < >  --    < > 63* 71* 74*  CREATININE 2.22*   < >  --    < > 3.32* 3.06* 3.15*  CALCIUM  8.1*   < >  --    < > 7.7* 8.0* 8.3*  MG  --   --  2.3  --  2.6*  --  2.5*  PROT 6.4*  --   --   --   --   --   --   ALBUMIN 3.2*  --   --   --   --   --   --   AST 33  --   --   --   --   --   --   ALT 20  --   --   --   --   --   --   ALKPHOS 67  --   --   --   --   --   --   BILITOT 0.5  --   --   --   --   --   --   GFRNONAA 32*   < >  --    < > 20* 22* 21*  ANIONGAP 10   < >  --    < > 11 11 13    < > = values in this interval not displayed.    Lipids  Recent Labs  Lab 09/08/21 0500  CHOL 95  TRIG 38  HDL 33*  LDLCALC 54  CHOLHDL 2.9    Hematology Recent Labs  Lab 09/10/21 0346 09/10/21 2012 09/11/21 0058 09/11/21 0516 09/12/21 0250  WBC 9.2  --   --  7.2 7.0  RBC 2.93*  --   --  2.69* 2.68*  HGB 8.3*   < > 7.1* 7.6* 7.4*  HCT 25.3*   < > 21.2* 23.3* 23.3*  MCV 86.3  --   --  86.6 86.9  MCH 28.3  --   --  28.3 27.6  MCHC 32.8  --   --  32.6 31.8  RDW 14.5  --   --  14.2 14.0  PLT 157  --   --  161 176   < > = values in this interval not displayed.   Thyroid No results for input(s): TSH, FREET4 in the last 168 hours.  BNP Recent Labs  Lab 09/08/21 1403 09/09/21 0405 09/11/21 0516  BNP  1,081.8* 618.3* 964.9*    DDimer No results for input(s): DDIMER in the last 168 hours.   Radiology    US RENAL  Result Date: 09/10/2021 CLINICAL DATA:  A 66 year old male presents with acute kidney injury. EXAM: RENAL / URINARY TRACT ULTRASOUND COMPLETE COMPARISON:  Prior renal imaging from November of 2021 and CT from February of 2019 FINDINGS: Right Kidney: Renal measurements: 11.3 x 5.9 x 5.4 cm = volume: 187 mL. Mild increased cortical echogenicity with parenchymal thinning and cortical scarring. No hydronephrosis. Left Kidney: Renal measurements: 12.8 x 6.4 x 6.3 cm = volume: 272 mL. Increased cortical echogenicity without hydronephrosis. Signs of mild cortical scarring and parenchymal thinning Bladder: Decompressed with Foley catheter, not well evaluated. Other: None.  IMPRESSION: Signs of medical renal disease without hydronephrosis. Electronically Signed   By: Zetta Bills M.D.   On: 09/10/2021 11:23   DG Chest Port 1 View  Result Date: 09/11/2021 CLINICAL DATA:  Shortness of breath, respiratory failure. EXAM: PORTABLE CHEST 1 VIEW COMPARISON:  09/09/2021 and CT chest 09/07/2021. FINDINGS: Trachea is midline. Heart size stable. Right PICC tip is at the SVC RA junction. Mixed interstitial and airspace opacification bilaterally, with slight improvement in aeration in the left perihilar region. Probable bilateral pleural effusions. IMPRESSION: 1. Persistent mixed interstitial and airspace opacification with slight improvement in aeration in the left perihilar region, findings likely due to pneumonia. 2. Probable bilateral pleural effusions. Electronically Signed   By: Lorin Picket M.D.   On: 09/11/2021 08:34    Cardiac Studies   Echo 09/08/21: 1. Mild global hypokinesis with more severe hypokinesis in the  inferolateral, anterolateral, and apical myocardium. Left ventricular  ejection fraction, by estimation, is 30 to 35%. The left ventricle has  moderately decreased function. The left ventricle   demonstrates regional wall motion abnormalities (see scoring  diagram/findings for description). Left ventricular diastolic parameters  are consistent with Grade II diastolic dysfunction (pseudonormalization).   2. Right ventricular systolic function is mildly reduced. The right  ventricular size is normal. There is normal pulmonary artery systolic  pressure.   3. The mitral valve is normal in structure. Trivial mitral valve  regurgitation. No evidence of mitral stenosis.   4. The aortic valve is tricuspid. Aortic valve regurgitation is not  visualized. No aortic stenosis is present.   5. The inferior vena cava is dilated in size with >50% respiratory  variability, suggesting right atrial pressure of 8 mmHg.   Patient Profile     Mr. Zwahlen is a 24M with  CAD status post OM1 PCI, chronic diastolic heart failure, COPD, hypertension, hyperlipidemia, and diabetes admitted with acute on chronic diastolic heart failure and NSTEMI.   Assessment & Plan    # Hypoxic respiratory failure: # Acute systolic and diastolic heart failure: # CAP: # COPD exacerbation: # Hypertension:  Shortness of breath improving.  He was previously on 25 L HFNC.  He is now down to 8 L HFNC.  Continues to diurese well on Lasix drip.  Yesterday he was net -3.8 L.  He is net -9.7 L for the hospitalization.  Renal function is stable and he remains volume overloaded to continue diuresis at current rate.  CVP 14-19. Given his diabetes, heart failure, and renal dysfunction, would consider Farxiga prior to discharge.  Given his new systolic dysfunction amlodipine was discontinued.  Blood pressure well controlled on his regimen of hydralazine, carvedilol, and Imdur.     # NSTEMI: # Status post PCI, moderate CAD: # Hyperlipidemia:  His current presentation is consistent with NSTEMI and ischemia.  Echo revealed reduced systolic function with focal wall motion abnormality suspicious for obstructive coronary disease.  Renal function stable but still above his baseline around 1.7-1.9.  He also has anemia that is now stable on PPI and heparin drip, low he did require transfusion earlier in the hospitalization.  This is a tricky situation.  Ultimately needs an ischemic evaluation.  Will need a GI work-up first.  We will need to discuss the risk of worsening renal dysfunction with cath and may consider stress testing first.  For now he is being medically managed with aspirin, carvedilol, Imdur, and atorvastatin.  LD 54 this admission.   # Anemia:  Significant hemoglobin drop after starting heparin.  Stopped heparin and added a PPI.  Heparin has been resumed.  He will need a GI consult before invasive angiography.  Total critical care time: 30 minutes. Critical care time was exclusive of  separately billable procedures and treating other patients. Critical care was necessary to treat or prevent imminent or life-threatening deterioration. Critical care was time spent personally by me on the following activities: development of treatment plan with patient and/or surrogate as well as nursing, discussions with consultants, evaluation of patient's response to treatment, examination of patient, obtaining history from patient or surrogate, ordering and performing treatments and interventions, ordering and review of laboratory studies, ordering and review of radiographic studies, pulse oximetry and re-evaluation of patient's condition.     For questions or updates, please contact [place] Please consult [email or web address] for contact info under        Signed, Skeet Latch, MD  09/12/2021, 9:15 AM

## 2021-09-13 ENCOUNTER — Inpatient Hospital Stay (HOSPITAL_COMMUNITY): Payer: 59

## 2021-09-13 LAB — CBC
HCT: 23.6 % — ABNORMAL LOW (ref 39.0–52.0)
Hemoglobin: 7.5 g/dL — ABNORMAL LOW (ref 13.0–17.0)
MCH: 27.6 pg (ref 26.0–34.0)
MCHC: 31.8 g/dL (ref 30.0–36.0)
MCV: 86.8 fL (ref 80.0–100.0)
Platelets: 194 10*3/uL (ref 150–400)
RBC: 2.72 MIL/uL — ABNORMAL LOW (ref 4.22–5.81)
RDW: 13.8 % (ref 11.5–15.5)
WBC: 7.3 10*3/uL (ref 4.0–10.5)
nRBC: 0 % (ref 0.0–0.2)

## 2021-09-13 LAB — COOXEMETRY PANEL
Carboxyhemoglobin: 2.2 % — ABNORMAL HIGH (ref 0.5–1.5)
Methemoglobin: 0.9 % (ref 0.0–1.5)
O2 Saturation: 72 %
Total hemoglobin: 7.6 g/dL — ABNORMAL LOW (ref 12.0–16.0)

## 2021-09-13 LAB — BASIC METABOLIC PANEL
Anion gap: 10 (ref 5–15)
BUN: 74 mg/dL — ABNORMAL HIGH (ref 8–23)
CO2: 34 mmol/L — ABNORMAL HIGH (ref 22–32)
Calcium: 8.3 mg/dL — ABNORMAL LOW (ref 8.9–10.3)
Chloride: 92 mmol/L — ABNORMAL LOW (ref 98–111)
Creatinine, Ser: 2.84 mg/dL — ABNORMAL HIGH (ref 0.61–1.24)
GFR, Estimated: 24 mL/min — ABNORMAL LOW (ref >60)
Glucose, Bld: 195 mg/dL — ABNORMAL HIGH (ref 70–99)
Potassium: 3.8 mmol/L (ref 3.5–5.1)
Sodium: 136 mmol/L (ref 135–145)

## 2021-09-13 LAB — GLUCOSE, CAPILLARY
Glucose-Capillary: 151 mg/dL — ABNORMAL HIGH (ref 70–99)
Glucose-Capillary: 178 mg/dL — ABNORMAL HIGH (ref 70–99)
Glucose-Capillary: 194 mg/dL — ABNORMAL HIGH (ref 70–99)
Glucose-Capillary: 231 mg/dL — ABNORMAL HIGH (ref 70–99)

## 2021-09-13 LAB — MAGNESIUM: Magnesium: 2.2 mg/dL (ref 1.7–2.4)

## 2021-09-13 MED ORDER — PANTOPRAZOLE SODIUM 40 MG PO TBEC
40.0000 mg | DELAYED_RELEASE_TABLET | Freq: Every day | ORAL | Status: DC
  Administered 2021-09-14 – 2021-09-16 (×3): 40 mg via ORAL
  Filled 2021-09-13 (×3): qty 1

## 2021-09-13 MED ORDER — AMOXICILLIN-POT CLAVULANATE 875-125 MG PO TABS
1.0000 | ORAL_TABLET | Freq: Two times a day (BID) | ORAL | Status: DC
  Administered 2021-09-13 – 2021-09-16 (×6): 1 via ORAL
  Filled 2021-09-13 (×6): qty 1

## 2021-09-13 NOTE — Assessment & Plan Note (Addendum)
Patient has been afebrile, pulmonary infiltrates are improving.   Plan to continue antibiotic therapy with Augmentin, for a completer 8 days .  Continue with bronchodilator therapy and airway clearing techniques.  Out of bed to chair.

## 2021-09-13 NOTE — Assessment & Plan Note (Addendum)
CKD stage 3b/ hypokalemia   Renal function today with serum cr at 2,73mK is 3,8 and Na 133, with bicarbonate at 37.  Plan to continue diuresis with torsemide and follow renal function in am.

## 2021-09-13 NOTE — Progress Notes (Signed)
Progress Note  Patient Name: ZYKEEM BAUSERMAN Date of Encounter: 09/13/2021  [facility] Cardiologist: Skeet Latch, MD   Subjective   Feeling much better.  Breathing continues to improve.   Inpatient Medications    Scheduled Meds:  albuterol  2.5 mg Nebulization BID   aspirin EC  81 mg Oral Daily   atorvastatin  40 mg Oral Daily   brimonidine  1 drop Right Eye BID   carvedilol  12.5 mg Oral BID WC   Chlorhexidine Gluconate Cloth  6 each Topical Daily   dorzolamide  1 drop Right Eye BID   enoxaparin (LOVENOX) injection  40 mg Subcutaneous Q24H   guaiFENesin  600 mg Oral BID   hydrALAZINE  100 mg Oral TID   insulin aspart  0-9 Units Subcutaneous TID AC & HS   insulin glargine-yfgn  10 Units Subcutaneous QHS   isosorbide mononitrate  60 mg Oral Daily   pantoprazole  40 mg Oral BID   potassium chloride  40 mEq Oral Daily   senna  1 tablet Oral BID   sodium chloride flush  10-40 mL Intracatheter Q12H   timolol  1 drop Right Eye BID   Continuous Infusions:  sodium chloride Stopped (09/08/21 1437)   furosemide (LASIX) 200 mg in dextrose 5% 100 mL (44m/mL) infusion 15 mg/hr (09/13/21 1245)   PRN Meds: sodium chloride, acetaminophen **OR** acetaminophen, albuterol, ammonium lactate, docusate sodium, ondansetron (ZOFRAN) IV, polyethylene glycol, sodium chloride flush, traMADol   Vital Signs    Vitals:   09/13/21 0800 09/13/21 0826 09/13/21 1139 09/13/21 1241  BP: (!) 141/68  (!) 141/68 126/67  Pulse: 67  68   Resp: 15   15  Temp:    97.7 F (36.5 C)  TempSrc:    Oral  SpO2: 95% 99% 94%   Weight:      Height:        Intake/Output Summary (Last 24 hours) at 09/13/2021 1314 Last data filed at 09/13/2021 1200 Gross per 24 hour  Intake 1320.78 ml  Output 5025 ml  Net -3704.22 ml   Last 3 Weights 09/13/2021 09/12/2021 09/11/2021  Weight (lbs) 248 lb 0.3 oz 253 lb 1.4 oz 258 lb 6.1 oz  Weight (kg) 112.5 kg 114.8 kg 117.2 kg      Telemetry    Sinus rhythm.   Frequent PVCs.- Personally Reviewed  ECG    Sinus rhythm.  Rate 94 bpm.  Right bundle branch block.  Left posterior fascicular block. - Personally Reviewed  Physical Exam   VS:  BP 126/67 (BP Location: Left Arm)    Pulse 68    Temp 97.7 F (36.5 C) (Oral)    Resp 15    Ht 5' 10"  (1.778 m)    Wt 112.5 kg    SpO2 94%    BMI 35.59 kg/m  , BMI Body mass index is 35.59 kg/m. GENERAL: Critically ill-appearing.  Mild respiratory distress on high flow oxygen. HEENT: Pupils equal round and reactive, fundi not visualized, oral mucosa unremarkable NECK: + jugular venous distention, waveform within normal limits, carotid upstroke brisk and symmetric, no bruits, no thyromegaly LUNGS: Coarse breath with rhonchi and wheezing bilaterally. HEART:  RRR.  PMI not displaced or sustained,S1 and S2 within normal limits, no S3, no S4, no clicks, no rubs, no murmurs ABD:  Flat, positive bowel sounds normal in frequency in pitch, no bruits, no rebound, no guarding, no midline pulsatile mass, no hepatomegaly, no splenomegaly EXT:  2 plus pulses throughout,  1+woody lower extremity edema bilaterally. Improving significantly.  Unna boots bilaterally, no cyanosis no clubbing SKIN:  No rashes no nodules NEURO:  Cranial nerves II through XII grossly intact, motor grossly intact throughout PSYCH:  Cognitively intact, oriented to person place and time   Labs    High Sensitivity Troponin:   Recent Labs  Lab 09/07/21 1255 09/07/21 1757 09/07/21 2110 09/08/21 0217 09/08/21 0556  TROPONINIHS 141* 333* 714* 2,512* 4,145*     Chemistry Recent Labs  Lab 09/07/21 1030 09/08/21 0217 09/10/21 0346 09/11/21 0516 09/12/21 0250 09/12/21 1516 09/13/21 0420  NA 139   < > 138   < > 138 137 136  K 3.5   < > 3.8   < > 3.6 3.9 3.8  CL 102   < > 99   < > 92* 94* 92*  CO2 27   < > 28   < > 33* 33* 34*  GLUCOSE 165*   < > 102*   < > 130* 209* 195*  BUN 37*   < > 63*   < > 74* 71* 74*  CREATININE 2.22*   < > 3.32*   < >  3.15* 2.88* 2.84*  CALCIUM 8.1*   < > 7.7*   < > 8.3* 8.2* 8.3*  MG  --    < > 2.6*  --  2.5*  --  2.2  PROT 6.4*  --   --   --   --   --   --   ALBUMIN 3.2*  --   --   --   --   --   --   AST 33  --   --   --   --   --   --   ALT 20  --   --   --   --   --   --   ALKPHOS 67  --   --   --   --   --   --   BILITOT 0.5  --   --   --   --   --   --   GFRNONAA 32*   < > 20*   < > 21* 24* 24*  ANIONGAP 10   < > 11   < > 13 10 10    < > = values in this interval not displayed.    Lipids  Recent Labs  Lab 09/08/21 0500  CHOL 95  TRIG 38  HDL 33*  LDLCALC 54  CHOLHDL 2.9    Hematology Recent Labs  Lab 09/11/21 0516 09/12/21 0250 09/13/21 0420  WBC 7.2 7.0 7.3  RBC 2.69* 2.68* 2.72*  HGB 7.6* 7.4* 7.5*  HCT 23.3* 23.3* 23.6*  MCV 86.6 86.9 86.8  MCH 28.3 27.6 27.6  MCHC 32.6 31.8 31.8  RDW 14.2 14.0 13.8  PLT 161 176 194   Thyroid No results for input(s): TSH, FREET4 in the last 168 hours.  BNP Recent Labs  Lab 09/08/21 1403 09/09/21 0405 09/11/21 0516  BNP 1,081.8* 618.3* 964.9*    DDimer No results for input(s): DDIMER in the last 168 hours.   Radiology    No results found.  Cardiac Studies   Echo 09/08/21: 1. Mild global hypokinesis with more severe hypokinesis in the  inferolateral, anterolateral, and apical myocardium. Left ventricular  ejection fraction, by estimation, is 30 to 35%. The left ventricle has  moderately decreased function. The left ventricle   demonstrates regional wall motion abnormalities (see scoring  diagram/findings  for description). Left ventricular diastolic parameters  are consistent with Grade II diastolic dysfunction (pseudonormalization).   2. Right ventricular systolic function is mildly reduced. The right  ventricular size is normal. There is normal pulmonary artery systolic  pressure.   3. The mitral valve is normal in structure. Trivial mitral valve  regurgitation. No evidence of mitral stenosis.   4. The aortic valve is  tricuspid. Aortic valve regurgitation is not  visualized. No aortic stenosis is present.   5. The inferior vena cava is dilated in size with >50% respiratory  variability, suggesting right atrial pressure of 8 mmHg.   Patient Profile     Mr. Bungert is a 75M with CAD status post OM1 PCI, chronic diastolic heart failure, COPD, hypertension, hyperlipidemia, and diabetes admitted with acute on chronic diastolic heart failure and NSTEMI.   Assessment & Plan    # Hypoxic respiratory failure: # Acute systolic and diastolic heart failure: # CAP: # COPD exacerbation: # Hypertension:  Shortness of breath improving.  He was previously on 25 L HFNC.  He is now down to 2 L.  Continues to diurese well on Lasix drip.  Yesterday he was net -5.  She is -14.7L this hospitalization.   Given his diabetes, heart failure, and renal dysfunction, would consider Farxiga prior to discharge.  Given his new systolic dysfunction amlodipine was discontinued.  Blood pressure well controlled on his regimen of hydralazine, carvedilol, and Imdur.  Likely transition or oral diuretics in the next 24-48 hours.    # NSTEMI: # Status post PCI, moderate CAD: # Hyperlipidemia:  His current presentation is consistent with NSTEMI and ischemia.  Echo revealed reduced systolic function with focal wall motion abnormality suspicious for obstructive coronary disease.  Renal function stable but still above his baseline around 1.7-1.9.  He also has anemia that is now stable on PPI and heparin drip, low he did require transfusion earlier in the hospitalization.  This is a tricky situation.  Ultimately needs an ischemic evaluation.  Will need a GI work-up first.  We will need to discuss the risk of worsening renal dysfunction with cath and may consider stress testing first.  For now he is being medically managed with aspirin, carvedilol, Imdur, and atorvastatin.  LD 54 this admission.   # Anemia:  Significant hemoglobin drop after starting  heparin.  Stopped heparin and added a PPI.  Heparin was stopped yesterday.  Recommend transfusing to maintain hgb >8 given his NSTEMI presentation with new WMA.      For questions or updates, please contact [place] Please consult [email or web address] for contact info under        Signed, Skeet Latch, MD  09/13/2021, 1:14 PM

## 2021-09-13 NOTE — Assessment & Plan Note (Addendum)
Patient with wall motion abnormalities on echocardiogram at the left ventricle suggestive ischemic event.  Continue with carvedilol, asa, statin and isosorbide.   May need GI work up before ischemic work up, due to anemia while on heparin.  Note that patient had no melena, hematochezia or hematemesis during this hospitalization   Will follow with cardiology recommendations in regards of further ischemic workup during this hospitalization.

## 2021-09-13 NOTE — Assessment & Plan Note (Addendum)
Continue with hydralazine, isosorbide and carvedilol for blood pressure control.

## 2021-09-13 NOTE — Hospital Course (Addendum)
Derek Lowery was admitted to the hospital with the working diagnosis of acute hypoxemic respiratory failure due to cardiogenic pulmonary edema, acute heart failure decompensation.    66 yo male with the past medical history of heart failure, T2DM, HTN, CAD (sp drug eluding stent to OM2 on 2012), and COPD who presented with dyspnea. Patient reported several days of not feeling well, with productive cough and progressive dyspnea. Because of worsening symptoms EMS was called and he was found hypoxic, he was placed on non re-breather mask and was transported to the hospital. On his initial physical examination his blood pressure was 154/86, HR 96, RR 27, T 98.6 and 02 saturation 85% to 93%, he had decreased breath sounds at the right base and had expiratory rhonchi, heart with S1 and S2 present and rhythmic, no gallops, or rubs, abdomen protuberant but not tender, positive lower extremity edema.   Na 139, K 3,5, CL 102, bicarbonate 27, glucose 165, bun 37 and cr 2,2 BNP 620 Wbc 6,6, hgb 9,6 hct 29 and plt 130 SARS COVID 19 negative   Urine analysis with SG 1,014 with 0-5 wbc   Chest radiograph with bilateral interstitial and alveolar infiltrates, diffuse.  CT cheat with bilateral ground glass opacities.   EKG with 96 bpm, right axis deviation, QTc 501 and right bundle branch block, sinus rhythm with no significant ST segment or T wave changes.   Patient developed worsening respiratory failure and was placed on heated high flow nasal cannula and then on on invasive mechanical ventilation.  Admitted to the ICU  Patient was placed on broad spectrum antibiotic therapy and diuresis with good toleration. Improved oxygen requirements.   Patient was diagnosed with NSTEMI and was placed on heparin drip.   01/27 transfer to [facility]  Patient clinically improving follow up chest film from 01/27 with improvement in infiltrates.  Transfer to telemetry from progressive care.   01/29 transitioned to oral  diuretic therapy, pending final recommendations from cardiology in regards of ischemic workup during this hospitalization.  Patient will need home health services at home and possible home 02.

## 2021-09-13 NOTE — Progress Notes (Signed)
Physical Therapy Treatment Patient Details Name: Derek Lowery MRN: [id number] DOB: [birth year] Today's Date: 09/13/2021   History of Present Illness Pt is a 66 y/o male admitted 1/21 due to multifocal PNA and NSTEMI. Pt with increasing supplemental O2 needs and transferred to ICU 1/22. PMH: CHF, CAD, HTN, DM, HLD, glaucoma.    PT Comments    Pt starting to show good improvement toward goals, less limited by oxygenation, still easily fatigued.  Emphasis on normalizing transitions to EOB , sit to stand, strengthening standing exercise and progression of gait stability and stamina.  Pt is generally mobile at a min guard level.     Recommendations for follow up therapy are one component of a multi-disciplinary discharge planning process, led by the attending physician.  Recommendations may be updated based on patient status, additional functional criteria and insurance authorization.  Follow Up Recommendations  Home health PT     Assistance Recommended at Discharge Intermittent Supervision/Assistance  Patient can return home with the following A little help with walking and/or transfers;A little help with bathing/dressing/bathroom   Equipment Recommendations  None recommended by PT    Recommendations for Other Services       Precautions / Restrictions Precautions Precautions: Fall Precaution Comments: monitor O2 (does not wear at baseline)     Mobility  Bed Mobility Overal bed mobility: Needs Assistance Bed Mobility: Supine to Sit     Supine to sit: Supervision Sit to supine: Supervision   General bed mobility comments: flattened bed, fairly easy transition with UE assist.    Transfers Overall transfer level: Needs assistance Equipment used: Rolling walker (2 wheels) Transfers: Sit to/from Stand Sit to Stand: Min guard           General transfer comment: appropriate use of UE's    Ambulation/Gait Ambulation/Gait assistance: Min guard Gait Distance (Feet):  160 Feet Assistive device: Rolling walker (2 wheels) Gait Pattern/deviations: Step-through pattern   Gait velocity interpretation: 1.31 - 2.62 ft/sec, indicative of limited community ambulator   General Gait Details: generally steady with 2 self recoverable deviations.  Chooses to walk slower than age appropriate, but can speed up to cuing.   Stairs             Wheelchair Mobility    Modified Rankin (Stroke Patients Only)       Balance     Sitting balance-Leahy Scale: Fair       Standing balance-Leahy Scale: Poor Standing balance comment: fair static standing with UE support needed                            Cognition Arousal/Alertness: Awake/alert Behavior During Therapy: WFL for tasks assessed/performed Overall Cognitive Status: Within Functional Limits for tasks assessed                                 General Comments: likely at baseline        Exercises General Exercises - Lower Extremity Hip ABduction/ADduction: AROM, Both, 5 reps, Strengthening, Standing Hip Flexion/Marching: AROM, Strengthening, Both, 5 reps, Standing Heel Raises: AROM, 5 reps, Standing Mini-Sqauts: AROM, 5 reps, Standing    General Comments General comments (skin integrity, edema, etc.): Ambulated with 4L [place] and SpO2 maintained at or over 91%,  HR in the 90's      Pertinent Vitals/Pain Pain Assessment Pain Assessment: Faces Faces Pain Scale: Hurts a little bit Pain Location:  back on right aroung hip. Pain Descriptors / Indicators: Aching, Guarding Pain Intervention(s): Monitored during session    Home Living                          Prior Function            PT Goals (current goals can now be found in the care plan section) Acute Rehab PT Goals Patient Stated Goal: to go home PT Goal Formulation: With patient Time For Goal Achievement: 09/26/21 Potential to Achieve Goals: Fair Progress towards PT goals: Progressing toward  goals    Frequency    Min 3X/week      PT Plan Current plan remains appropriate    Co-evaluation              AM-PAC PT "6 Clicks" Mobility   Outcome Measure  Help needed turning from your back to your side while in a flat bed without using bedrails?: A Little Help needed moving from lying on your back to sitting on the side of a flat bed without using bedrails?: A Little Help needed moving to and from a bed to a chair (including a wheelchair)?: A Little Help needed standing up from a chair using your arms (e.g., wheelchair or bedside chair)?: A Little Help needed to walk in hospital room?: A Little Help needed climbing 3-5 steps with a railing? : A Little 6 Click Score: 18    End of Session   Activity Tolerance: Patient limited by fatigue Patient left: with call bell/phone within reach;in bed Nurse Communication: Mobility status PT Visit Diagnosis: Unsteadiness on feet (R26.81);Muscle weakness (generalized) (M62.81)     Time: 7106-2694 PT Time Calculation (min) (ACUTE ONLY): 25 min  Charges:  $Gait Training: 8-22 mins $Therapeutic Exercise: 8-22 mins                     09/13/2021  Derek Lowery., PT Acute Rehabilitation Services [phone]  (pager) [phone]  (office)   Derek Lowery 09/13/2021, 11:10 AM

## 2021-09-13 NOTE — Assessment & Plan Note (Addendum)
Considering ischemic heart disease likely target will be a hgb around 8 or above. Iron panel with serum iron 35, TIBC 182, tranasferrin saturation of 19, ferritin 151, transferrin 130.  Consistent with anemia of chronic disease.   Hgb is 7,6 with Hct at 23,8,  Follow H&H in am if continue to drop will plan for PRBC transfusion in am

## 2021-09-13 NOTE — Progress Notes (Addendum)
Progress Note   Patient: Derek Lowery [id number] DOB: 1956/02/10 DOA: 09/07/2021     6 DOS: the patient was seen and examined on 09/13/2021   Brief hospital course: Derek Lowery was admitted to the hospital with the working diagnosis of acute hypoxemic respiratory failure due to cardiogenic pulmonary edema, acute heart failure decompensation.    66 yo male with the past medical history of heart failure, T2DM, HTN, CAD (sp drug eluding stent to OM2 on 2012), and COPD who presented with dyspnea. Patient reported several days of not feeling well, with productive cough and progressive dyspnea. Because of worsening symptoms EMS was called and he was found hypoxic, he was placed on non re-breather mask and was transported to the hospital. On his initial physical examination his blood pressure was 154/86, HR 96, RR 27, T 98.6 and 02 saturation 85% to 93%, he had decreased breath sounds at the right base and had expiratory rhonchi, heart with S1 and S2 present and rhythmic, no gallops, or rubs, abdomen protuberant but not tender, positive lower extremity edema.   Na 139, K 3,5, CL 102, bicarbonate 27, glucose 165, bun 37 and cr 2,2 BNP 620 Wbc 6,6, hgb 9,6 hct 29 and plt 130 SARS COVID 19 negative   Urine analysis with SG 1,014 with 0-5 wbc   Chest radiograph with bilateral interstitial and alveolar infiltrates, diffuse.  CT cheat with bilateral ground glass opacities.   EKG with 96 bpm, right axis deviation, QTc 501 and right bundle branch block, sinus rhythm with no significant ST segment or T wave changes.   Patient developed worsening respiratory failure and was placed on heated high flow nasal cannula and then on on invasive mechanical ventilation.  Admitted to the ICU  Patient was placed on broad spectrum antibiotic therapy and diuresis with good toleration. Improved oxygen requirements.   Patient was diagnosed with NSTEMI and was placed on heparin drip.   01/27 transfer to  [facility]  Assessment and Plan * Acute respiratory failure with hypoxia [facility]) Patient with improvement in respiratory failure.  His oxygenation today is 97% on 2 L.min per [place] Follow up chest radiograph from 01/25 with bilateral patchy consolidations, more prominent on the left lower lobe.   Plan to continue oxymetry monitoring and supplemental 02 per [place] to keep 02 saturation 92% or greater Will follow up chest film today.    Multifocal pneumonia- (present on admission) Bilateral pulmonary infiltrates, improving oxygenation.  Wbc 7,3, he has been afebrile, cultures have been with no growth.  Plan to continue with antibiotic therapy with Augmentin, will plan for 8 days in the setting of complicated pneumonia. Continue with bronchodilator therapy and airway clearing techniques.  Out of bed to chair tid with meals, PT and OT.   Acute systolic CHF (congestive heart failure) (HCC) Echocardiogram with EF 30 to 35% with mild global hypokinesis, with more severe hypokinesis in the inferior lateral and anterolateral ans apical myocardium. Mild reduction in RV systolic function.   Urine output over last 24 hrs 6,050 ml with a negative fluid balance since admission of 14,743 ml.  Patient on furosemide drip 15 mg per hr. Heart failure management with carvedilol and after load reduction with hydralazine 100 mg tid/ isosorbide.  Holding on RAAS inhibition due to risk of worsening renal function.    Essential hypertension- (present on admission) Systolic blood pressure has been 120 to 140 mmHg. Continue with hydralazine, isosorbide and carvedilol for blood pressure control.  Continue diuresis with furosemide.  NSTEMI (non-ST elevated myocardial infarction) ([place])- (present on admission) Patient with wall motion abnormalities on echocardiogram at the left ventricle suggestive ischemic event.  He has been medically treated with IV heparin and B blockade, will need GI workup before cardiac  catheterization.  Continue with asa, statin and isosorbide.   Type 2 diabetes mellitus with hyperlipidemia (HCC) Glucose has been controlled, his fasting glucose today is 195 On basal insulin 10 units daily  Continue with insulin sliding scale for glucose cover and monitoring.   AKI (acute kidney injury) ([place]) CKD stage 3b/ hypokalemia  Patient with good response to loop diuretic therapy. His renal function today with serum cr at 2,84, K 3,8 and serum bicarbonate at 34.  Mg is 2,2  Plan to continue diuresis with furosemide and follow up renal function in am, avoid hypotension and nephrotoxic medications.  Continue K correction with Kcl  Anemia- (present on admission) Patient with low hgb today is 7,5, Apparently he has not being transfused with target above 7. Considering ischemic heart disease likely target will be a hgb of 8 or above. Patient currently with no chest pain.  Plan to check iron panel.  Follow with Hgb and Hct, will consider PRBC transfusion if persistent hgb in the 7 range.      Subjective: Patient continue to be very weak and deconditioned, today with no chest pain, his dyspnea and lower extremity edema are improving.   Objective BP 126/67 (BP Location: Left Arm)    Pulse 69    Temp 97.7 F (36.5 C) (Oral)    Resp 16    Ht 5' 10"  (1.778 m)    Wt 112.5 kg    SpO2 97%    BMI 35.59 kg/m   Neurology awake and alert ENT positive pallor  Cardiovascular heart with S1 and S2 present and rhythmic, no gallops or murmurs. Positive ++ bilateral lower extremity edema.  No JVD Pulmonary positive bilateral rales but not wheezing or rhonchi  Abdominal soft and non tender Skin Musculoskeletal   Data Reviewed:    Family Communication: no family at the bedside   Disposition: Status is: Inpatient  Remains inpatient appropriate because: IV diuresis and heart failure workup           Author: Tawni Millers, MD 09/13/2021 4:27 PM  For on call  review [email or web address].

## 2021-09-13 NOTE — TOC Progression Note (Signed)
Transition of Care [facility]) - Progression Note    Patient Details  Name: Derek Lowery MRN: [id number] Date of Birth: Aug 07, 1956  Transition of Care [facility]) CM/SW Contact  Graves-Bigelow, Ocie Cornfield, RN Phone Number: 09/13/2021, 12:27 PM  Clinical Narrative:  Case Manager was able to speak with patient and sister regarding home health services. Patient is agreeable to [facility] RN if he continues with the unna boots, PT/OT services. Patient and sister did not have a choice regarding agency. Family is agreeable to [place]. Case Manager made the referral with [facility] and start of care to begin within 24-48 hours post transition home. Case Manager will follow for additional transition of care needs.    Expected Discharge Plan: [place] Barriers to Discharge: Continued Medical Work up  Expected Discharge Plan and Services Expected Discharge Plan: [place] In-house Referral: Clinical Social Work Discharge Planning Services: CM Consult Post Acute Care Choice: [place] arrangements for the past 2 months: Single Family Home                 DME Arranged: N/A DME Agency: NA       HH Arranged: RN, Disease Management, PT, OT HH Agency: [place] Date [facility] Agency Contacted: 09/13/21 Time HH Agency Contacted: 49 Representative spoke with at [place]: [place]  Readmission Risk Interventions Readmission Risk Prevention Plan 09/11/2021  Transportation Screening Complete  PCP or Specialist Appt within 3-5 Days Complete  HRI or [place] Complete  Social Work Consult for [place] Planning/Counseling Complete  Palliative Care Screening Not Applicable  Medication Review Press photographer) Complete  Some recent data might be hidden

## 2021-09-13 NOTE — Assessment & Plan Note (Addendum)
Dyspnea continue to improve, but not able to wean completely to room air.  Positive dyspnea on exertion.   His oxygenation today is 93 to 96%, on 1.5 L/min per [place]. Plan to continue oxygen saturation monitoring, keep 02 saturation 92% or greater may need home 02 at discharge.

## 2021-09-13 NOTE — Assessment & Plan Note (Addendum)
Echocardiogram with EF 30 to 35% with mild global hypokinesis, with more severe hypokinesis in the inferior lateral and anterolateral ans apical myocardium. Mild reduction in RV systolic function.   Hypervolemia continue to improve, his urine output over last 24 hrs is 1,375 ml foley cathter has been removed.  Systolic blood pressure is 120 mmHg.  Medical  management with carvedilol, hydralazine and isosorbide No RAAS inhibition to prevent acute renal failure.   Continue diuresis with torsemide

## 2021-09-13 NOTE — Assessment & Plan Note (Addendum)
On insulin regimen with basal insulin and sliding scale for glucose cover and monitoring  Basal insulin with 10 units of glargine.   Fasting glucose this am 266. Capillary 195 and 186   Continue with atorvastatin.

## 2021-09-14 LAB — IRON AND TIBC
Iron: 35 ug/dL — ABNORMAL LOW (ref 45–182)
Saturation Ratios: 19 % (ref 17.9–39.5)
TIBC: 182 ug/dL — ABNORMAL LOW (ref 250–450)
UIBC: 147 ug/dL

## 2021-09-14 LAB — GLUCOSE, CAPILLARY
Glucose-Capillary: 161 mg/dL — ABNORMAL HIGH (ref 70–99)
Glucose-Capillary: 188 mg/dL — ABNORMAL HIGH (ref 70–99)
Glucose-Capillary: 267 mg/dL — ABNORMAL HIGH (ref 70–99)
Glucose-Capillary: 206 mg/dL — ABNORMAL HIGH (ref 70–99)
Glucose-Capillary: 237 mg/dL — ABNORMAL HIGH (ref 70–99)

## 2021-09-14 LAB — BASIC METABOLIC PANEL
Anion gap: 9 (ref 5–15)
BUN: 76 mg/dL — ABNORMAL HIGH (ref 8–23)
CO2: 36 mmol/L — ABNORMAL HIGH (ref 22–32)
Calcium: 8.5 mg/dL — ABNORMAL LOW (ref 8.9–10.3)
Chloride: 90 mmol/L — ABNORMAL LOW (ref 98–111)
Creatinine, Ser: 2.88 mg/dL — ABNORMAL HIGH (ref 0.61–1.24)
GFR, Estimated: 24 mL/min — ABNORMAL LOW (ref >60)
Glucose, Bld: 222 mg/dL — ABNORMAL HIGH (ref 70–99)
Potassium: 3.7 mmol/L (ref 3.5–5.1)
Sodium: 135 mmol/L (ref 135–145)

## 2021-09-14 LAB — TRANSFERRIN: Transferrin: 130 mg/dL — ABNORMAL LOW (ref 180–329)

## 2021-09-14 LAB — CBC
HCT: 24.1 % — ABNORMAL LOW (ref 39.0–52.0)
Hemoglobin: 7.9 g/dL — ABNORMAL LOW (ref 13.0–17.0)
MCH: 28.2 pg (ref 26.0–34.0)
MCHC: 32.8 g/dL (ref 30.0–36.0)
MCV: 86.1 fL (ref 80.0–100.0)
Platelets: 208 10*3/uL (ref 150–400)
RBC: 2.8 MIL/uL — ABNORMAL LOW (ref 4.22–5.81)
RDW: 13.7 % (ref 11.5–15.5)
WBC: 9.6 10*3/uL (ref 4.0–10.5)
nRBC: 0 % (ref 0.0–0.2)

## 2021-09-14 LAB — FERRITIN: Ferritin: 151 ng/mL (ref 24–336)

## 2021-09-14 LAB — COOXEMETRY PANEL
Carboxyhemoglobin: 2 % — ABNORMAL HIGH (ref 0.5–1.5)
Methemoglobin: 1.2 % (ref 0.0–1.5)
O2 Saturation: 70.8 %
Total hemoglobin: 9.9 g/dL — ABNORMAL LOW (ref 12.0–16.0)

## 2021-09-14 LAB — MAGNESIUM: Magnesium: 2 mg/dL (ref 1.7–2.4)

## 2021-09-14 NOTE — Progress Notes (Signed)
Central tele called RN and said pt was having some wide QRSs. RN went to bedside and assessed pt. Pt said he was feeling fine. Monitor was showing wide QRSs, so RN contacted Dr. Cathlean Sauer, who gave verbal order for EKG. EKG done and MD to review. RN continuing to monitor.

## 2021-09-14 NOTE — Progress Notes (Signed)
Progress Note   Patient: Derek Lowery [id number] DOB: 17-Apr-1956 DOA: 09/07/2021     7 DOS: the patient was seen and examined on 09/14/2021   Brief hospital course: Derek Lowery was admitted to the hospital with the working diagnosis of acute hypoxemic respiratory failure due to cardiogenic pulmonary edema, acute heart failure decompensation.    66 yo male with the past medical history of heart failure, T2DM, HTN, CAD (sp drug eluding stent to OM2 on 2012), and COPD who presented with dyspnea. Patient reported several days of not feeling well, with productive cough and progressive dyspnea. Because of worsening symptoms EMS was called and he was found hypoxic, he was placed on non re-breather mask and was transported to the hospital. On his initial physical examination his blood pressure was 154/86, HR 96, RR 27, T 98.6 and 02 saturation 85% to 93%, he had decreased breath sounds at the right base and had expiratory rhonchi, heart with S1 and S2 present and rhythmic, no gallops, or rubs, abdomen protuberant but not tender, positive lower extremity edema.   Na 139, K 3,5, CL 102, bicarbonate 27, glucose 165, bun 37 and cr 2,2 BNP 620 Wbc 6,6, hgb 9,6 hct 29 and plt 130 SARS COVID 19 negative   Urine analysis with SG 1,014 with 0-5 wbc   Chest radiograph with bilateral interstitial and alveolar infiltrates, diffuse.  CT cheat with bilateral ground glass opacities.   EKG with 96 bpm, right axis deviation, QTc 501 and right bundle branch block, sinus rhythm with no significant ST segment or T wave changes.   Patient developed worsening respiratory failure and was placed on heated high flow nasal cannula and then on on invasive mechanical ventilation.  Admitted to the ICU  Patient was placed on broad spectrum antibiotic therapy and diuresis with good toleration. Improved oxygen requirements.   Patient was diagnosed with NSTEMI and was placed on heparin drip.   01/27 transfer to  [facility]  Patient clinically improving follow up chest film from 01/27 with improvement in infiltrates.  Transfer to telemetry from progressive care.   Assessment and Plan * Acute respiratory failure with hypoxia (HCC) Continue to improve oxygenation and dyspnea.  Follow up chest film yesterday personally reviewed noted improvement in bilateral infiltrates, continue to have interstitial and alveolar infiltrates predominantly at the lower lobes.  Oxymetry is 97% on 2 L/min per   Plan to continue wean supplemental 02 to room air Encourage to get out of bed, to chair tid with meals.     Multifocal pneumonia- (present on admission) Clinically continue to improve.   Antibiotic therapy with Augmentin, 8 days therapy recommended for complicated pneumonia. Bronchodilator therapy and airway clearing techniques.  Continue to encourage mobility   Acute systolic CHF (congestive heart failure) (HCC) Echocardiogram with EF 30 to 35% with mild global hypokinesis, with more severe hypokinesis in the inferior lateral and anterolateral ans apical myocardium. Mild reduction in RV systolic function.   Urine output over last 24 hrs is 4,525 ml  His volume status continue to improve, to consider transition to oral diuretic therapy. Continue heart failure management with carvedilol, hydralazine and isosorbide Continue to hold RAAS inhibition to prevent renal failure.   Essential hypertension- (present on admission) Blood pressure has been stable with 130 to 272 mmHg systolic Plan to continue with hydralazine, isosorbide and carvedilol  To consider transition to oral torsemide.   NSTEMI (non-ST elevated myocardial infarction) ([place])- (present on admission) Patient with wall motion abnormalities on echocardiogram at  the left ventricle suggestive ischemic event.  Patient with no chest pain.  Medical therapy with carvedilol, asa, statin and isosorbide.  May need GI work up before ischemic work up, due to  anemia while on heparin.  Note that patient had no melena, hematochezia or hematemesis during this hospitalization   Type 2 diabetes mellitus with hyperlipidemia ([place]) Patient has been tolerating po well, his fasting glucose this am is 222.  Capillary glucose 194, 231, 161 Plan to continue with insulin regimen with basal insulin and sliding scale for glucose cover and monitoring   Acute kidney injury superimposed on CKD (HCC) CKD stage 3b/ hypokalemia  Adequate urine output, patient has been on furosemide drip with improvement in volume status. Renal function today with serum cr at 2,8 with K at 3,7 and serum bicarbonate at 36.   To consider transition to oral furosemide, at home patient on 40 mg torsemide daily.  Follow up renal function in am, avoid hypotension or nephrotoxic medications.   Anemia- (present on admission) Considering ischemic heart disease likely target will be a hgb around 8 or above. Iron panel with serum iron 35, TIBC 182, tranasferrin saturation of 19, ferritin 151, transferrin 130.  Consistent with anemia of chronic disease.   Plan to continue close follow up on cell count, currently patient has no signs of bleeding. If further drop in hgh will consider PRBC transfusion      Subjective: Patient is feeling better, but continue to be very weak and deconditioned, no chest pain, edemas and dyspnea continue to improve. No nausea or vomiting   Objective BP 133/64    Pulse 67    Temp 98 F (36.7 C)    Resp 18    Ht 5' 10"  (1.778 m)    Wt 106.3 kg    SpO2 97%    BMI 33.63 kg/m   Neurology  awake and alert ENT mild pallor Cardiovascular heart with S1 and S2 present and rhythmic, no gallops or rubs,  Lower extremity edema + present unna boots bilaterally   Pulmonary with rales at bases but no wheezing or rhonchi.  Abdominal soft and non tender Skin Musculoskeletal   Data Reviewed:    Family Communication: no family at the bedside   Disposition: Status is:  Inpatient  Remains inpatient appropriate because: heart failure management            Author: Tawni Millers, MD 09/14/2021 10:36 AM  For on call review [email or web address].

## 2021-09-14 NOTE — Progress Notes (Signed)
Mobility Specialist Progress Note:   09/14/21 1051  Mobility  Activity Ambulated with assistance in hallway  Level of Assistance Standby assist, set-up cues, supervision of patient - no hands on  Assistive Device Front wheel walker  Distance Ambulated (ft) 120 ft  Activity Response Tolerated well  $Mobility charge 1 Mobility   Pt received in bed willing to participate in mobility. No complaints of pain and asymptomatic. Pt left in bed with call bell in reach and all needs met.   [facility] Public librarian Phone [phone] Secondary Phone [phone]

## 2021-09-14 NOTE — Progress Notes (Signed)
Progress Note  Patient Name: Derek Lowery Date of Encounter: 09/14/2021  [facility] Cardiologist: Skeet Latch, MD   Subjective   Feeling a little bit better.  No chest pain.  Shortness of breath has improved.  Inpatient Medications    Scheduled Meds:  amoxicillin-clavulanate  1 tablet Oral Q12H   aspirin EC  81 mg Oral Daily   atorvastatin  40 mg Oral Daily   brimonidine  1 drop Right Eye BID   carvedilol  12.5 mg Oral BID WC   Chlorhexidine Gluconate Cloth  6 each Topical Daily   dorzolamide  1 drop Right Eye BID   enoxaparin (LOVENOX) injection  40 mg Subcutaneous Q24H   guaiFENesin  600 mg Oral BID   hydrALAZINE  100 mg Oral TID   insulin aspart  0-9 Units Subcutaneous TID AC & HS   insulin glargine-yfgn  10 Units Subcutaneous QHS   isosorbide mononitrate  60 mg Oral Daily   pantoprazole  40 mg Oral Daily   potassium chloride  40 mEq Oral Daily   senna  1 tablet Oral BID   sodium chloride flush  10-40 mL Intracatheter Q12H   timolol  1 drop Right Eye BID   Continuous Infusions:  sodium chloride Stopped (09/08/21 1437)   furosemide (LASIX) 200 mg in dextrose 5% 100 mL (42m/mL) infusion 15 mg/hr (09/14/21 0445)   PRN Meds: sodium chloride, acetaminophen **OR** acetaminophen, albuterol, ammonium lactate, docusate sodium, ondansetron (ZOFRAN) IV, polyethylene glycol, sodium chloride flush, traMADol   Vital Signs    Vitals:   09/14/21 0728 09/14/21 0938 09/14/21 1104 09/14/21 1124  BP: (!) 146/73 133/64 136/61   Pulse: 67  68 72  Resp: 18  18 16   Temp: 98 F (36.7 C)  98.1 F (36.7 C)   TempSrc:   Oral   SpO2: 97%  98% 93%  Weight:      Height:        Intake/Output Summary (Last 24 hours) at 09/14/2021 1300 Last data filed at 09/14/2021 0950 Gross per 24 hour  Intake 120 ml  Output 5125 ml  Net -5005 ml   Last 3 Weights 09/14/2021 09/13/2021 09/12/2021  Weight (lbs) 234 lb 5.6 oz 248 lb 0.3 oz 253 lb 1.4 oz  Weight (kg) 106.3 kg 112.5 kg 114.8  kg       Physical Exam   GEN: No acute distress.   Neck: No JVD Cardiac: RRR, no murmurs, rubs, or gallops.  Respiratory: Clear to auscultation bilaterally. GI: Soft, nontender, non-distended  MS: No edema; No deformity. Neuro:  Nonfocal  Psych: Normal affect   Labs    High Sensitivity Troponin:   Recent Labs  Lab 09/07/21 1255 09/07/21 1757 09/07/21 2110 09/08/21 0217 09/08/21 0556  TROPONINIHS 141* 333* 714* 2,512* 4,145*     Chemistry Recent Labs  Lab 09/12/21 0250 09/12/21 1516 09/13/21 0420 09/14/21 0315  NA 138 137 136 135  K 3.6 3.9 3.8 3.7  CL 92* 94* 92* 90*  CO2 33* 33* 34* 36*  GLUCOSE 130* 209* 195* 222*  BUN 74* 71* 74* 76*  CREATININE 3.15* 2.88* 2.84* 2.88*  CALCIUM 8.3* 8.2* 8.3* 8.5*  MG 2.5*  --  2.2 2.0  GFRNONAA 21* 24* 24* 24*  ANIONGAP 13 10 10 9     Lipids  Recent Labs  Lab 09/08/21 0500  CHOL 95  TRIG 38  HDL 33*  LDLCALC 54  CHOLHDL 2.9    Hematology Recent Labs  Lab 09/12/21 0250  09/13/21 0420 09/14/21 0315  WBC 7.0 7.3 9.6  RBC 2.68* 2.72* 2.80*  HGB 7.4* 7.5* 7.9*  HCT 23.3* 23.6* 24.1*  MCV 86.9 86.8 86.1  MCH 27.6 27.6 28.2  MCHC 31.8 31.8 32.8  RDW 14.0 13.8 13.7  PLT 176 194 208   Thyroid No results for input(s): TSH, FREET4 in the last 168 hours.  BNP Recent Labs  Lab 09/08/21 1403 09/09/21 0405 09/11/21 0516  BNP 1,081.8* 618.3* 964.9*    DDimer No results for input(s): DDIMER in the last 168 hours.   Radiology    DG Chest 1 View  Result Date: 09/13/2021 CLINICAL DATA:  Cough, follow-up. EXAM: CHEST  1 VIEW COMPARISON:  Chest x-ray 09/11/2021.  CT chest 09/07/2021. FINDINGS: Patchy bilateral mid and lower lung airspace disease has significantly decreased. Persistent minimal patchy and reticular opacities are noted in the mid and lower lungs bilaterally. No pleural effusion or pneumothorax. Right upper extremity PICC terminates over the distal SVC. The cardiomediastinal silhouette is within normal  limits. Osseous structures are stable. IMPRESSION: 1. Significant decrease in bilateral airspace disease. Electronically Signed   By: Ronney Asters M.D.   On: 09/13/2021 17:40    Cardiac Studies   EF 30 to 35% global hypokinesis.  Patient Profile     66 y.o. male here with acute systolic heart failure  Assessment & Plan    Acute systolic heart failure - Continuing with IV Lasix drip.  Excellent overall diuresis.  Monitoring creatinine. -Agree with considering Farxiga on discharge. -Amlodipine was discontinued. -Blood pressure well controlled on carvedilol isosorbide hydralazine. -May be able to transition over to oral Lasix tomorrow.  Non-ST elevation myocardial infarction - Kidney function/evaluation with catheterization may be an issue.  He is not having any ongoing chest discomfort.  Currently have him on goal-directed medical therapy. -No longer on anticoagulation secondary to hemoglobin drop.  Type 2 diabetes with hyperlipidemia - On atorvastatin.  Continue.    For questions or updates, please contact [facility] Please consult [email or web address] for contact info under        Signed, Candee Furbish, MD  09/14/2021, 1:00 PM

## 2021-09-14 NOTE — Plan of Care (Signed)
°  Problem: Education: Goal: Ability to demonstrate management of disease process will improve Outcome: Progressing Goal: Ability to verbalize understanding of medication therapies will improve Outcome: Progressing Goal: Individualized Educational Video(s) Outcome: Progressing   Problem: Activity: Goal: Capacity to carry out activities will improve Outcome: Progressing   Problem: Cardiac: Goal: Ability to achieve and maintain adequate cardiopulmonary perfusion will improve Outcome: Progressing   Problem: Activity: Goal: Ability to tolerate increased activity will improve Outcome: Progressing   Problem: Clinical Measurements: Goal: Ability to maintain a body temperature in the normal range will improve Outcome: Progressing   Problem: Respiratory: Goal: Ability to maintain adequate ventilation will improve Outcome: Progressing Goal: Ability to maintain a clear airway will improve Outcome: Progressing   Problem: Education: Goal: Knowledge of General Education information will improve Description: Including pain rating scale, medication(s)/side effects and non-pharmacologic comfort measures Outcome: Progressing   Problem: Health Behavior/Discharge Planning: Goal: Ability to manage health-related needs will improve Outcome: Progressing   Problem: Clinical Measurements: Goal: Ability to maintain clinical measurements within normal limits will improve Outcome: Progressing Goal: Will remain free from infection Outcome: Progressing Goal: Diagnostic test results will improve Outcome: Progressing Goal: Respiratory complications will improve Outcome: Progressing Goal: Cardiovascular complication will be avoided Outcome: Progressing   Problem: Activity: Goal: Risk for activity intolerance will decrease Outcome: Progressing   Problem: Nutrition: Goal: Adequate nutrition will be maintained Outcome: Progressing   Problem: Coping: Goal: Level of anxiety will  decrease Outcome: Progressing   Problem: Elimination: Goal: Will not experience complications related to bowel motility Outcome: Progressing Goal: Will not experience complications related to urinary retention Outcome: Progressing   Problem: Pain Managment: Goal: General experience of comfort will improve Outcome: Progressing   Problem: Safety: Goal: Ability to remain free from injury will improve Outcome: Progressing   Problem: Skin Integrity: Goal: Risk for impaired skin integrity will decrease Outcome: Progressing

## 2021-09-15 DIAGNOSIS — E1169 Type 2 diabetes mellitus with other specified complication: Secondary | ICD-10-CM

## 2021-09-15 DIAGNOSIS — N1832 Chronic kidney disease, stage 3b: Secondary | ICD-10-CM

## 2021-09-15 DIAGNOSIS — E785 Hyperlipidemia, unspecified: Secondary | ICD-10-CM

## 2021-09-15 DIAGNOSIS — N189 Chronic kidney disease, unspecified: Secondary | ICD-10-CM

## 2021-09-15 LAB — HEMOGLOBIN AND HEMATOCRIT, BLOOD
HCT: 23.8 % — ABNORMAL LOW (ref 39.0–52.0)
Hemoglobin: 7.6 g/dL — ABNORMAL LOW (ref 13.0–17.0)

## 2021-09-15 LAB — MAGNESIUM: Magnesium: 2.2 mg/dL (ref 1.7–2.4)

## 2021-09-15 LAB — BASIC METABOLIC PANEL
Anion gap: 8 (ref 5–15)
BUN: 76 mg/dL — ABNORMAL HIGH (ref 8–23)
CO2: 37 mmol/L — ABNORMAL HIGH (ref 22–32)
Calcium: 8.5 mg/dL — ABNORMAL LOW (ref 8.9–10.3)
Chloride: 88 mmol/L — ABNORMAL LOW (ref 98–111)
Creatinine, Ser: 2.73 mg/dL — ABNORMAL HIGH (ref 0.61–1.24)
GFR, Estimated: 25 mL/min — ABNORMAL LOW (ref >60)
Glucose, Bld: 266 mg/dL — ABNORMAL HIGH (ref 70–99)
Potassium: 3.8 mmol/L (ref 3.5–5.1)
Sodium: 133 mmol/L — ABNORMAL LOW (ref 135–145)

## 2021-09-15 LAB — COOXEMETRY PANEL
Carboxyhemoglobin: 2 % — ABNORMAL HIGH (ref 0.5–1.5)
Methemoglobin: 1.1 % (ref 0.0–1.5)
O2 Saturation: 56.6 %
Total hemoglobin: 10.7 g/dL — ABNORMAL LOW (ref 12.0–16.0)

## 2021-09-15 LAB — GLUCOSE, CAPILLARY
Glucose-Capillary: 195 mg/dL — ABNORMAL HIGH (ref 70–99)
Glucose-Capillary: 186 mg/dL — ABNORMAL HIGH (ref 70–99)
Glucose-Capillary: 212 mg/dL — ABNORMAL HIGH (ref 70–99)
Glucose-Capillary: 224 mg/dL — ABNORMAL HIGH (ref 70–99)

## 2021-09-15 MED ORDER — TORSEMIDE 20 MG PO TABS
40.0000 mg | ORAL_TABLET | Freq: Two times a day (BID) | ORAL | Status: DC
  Administered 2021-09-15 – 2021-09-16 (×2): 40 mg via ORAL
  Filled 2021-09-15 (×2): qty 2

## 2021-09-15 NOTE — Plan of Care (Signed)
°  Problem: Education: Goal: Ability to demonstrate management of disease process will improve Outcome: Progressing Goal: Ability to verbalize understanding of medication therapies will improve Outcome: Progressing Goal: Individualized Educational Video(s) Outcome: Progressing   Problem: Activity: Goal: Capacity to carry out activities will improve Outcome: Progressing   Problem: Cardiac: Goal: Ability to achieve and maintain adequate cardiopulmonary perfusion will improve Outcome: Progressing   Problem: Activity: Goal: Ability to tolerate increased activity will improve Outcome: Progressing   Problem: Clinical Measurements: Goal: Ability to maintain a body temperature in the normal range will improve Outcome: Progressing   Problem: Respiratory: Goal: Ability to maintain adequate ventilation will improve Outcome: Progressing Goal: Ability to maintain a clear airway will improve Outcome: Progressing   Problem: Education: Goal: Knowledge of General Education information will improve Description: Including pain rating scale, medication(s)/side effects and non-pharmacologic comfort measures Outcome: Progressing   Problem: Health Behavior/Discharge Planning: Goal: Ability to manage health-related needs will improve Outcome: Progressing   Problem: Clinical Measurements: Goal: Ability to maintain clinical measurements within normal limits will improve Outcome: Progressing Goal: Will remain free from infection Outcome: Progressing Goal: Diagnostic test results will improve Outcome: Progressing Goal: Respiratory complications will improve Outcome: Progressing Goal: Cardiovascular complication will be avoided Outcome: Progressing   Problem: Activity: Goal: Risk for activity intolerance will decrease Outcome: Progressing   Problem: Nutrition: Goal: Adequate nutrition will be maintained Outcome: Progressing   Problem: Coping: Goal: Level of anxiety will  decrease Outcome: Progressing   Problem: Elimination: Goal: Will not experience complications related to bowel motility Outcome: Progressing Goal: Will not experience complications related to urinary retention Outcome: Progressing   Problem: Pain Managment: Goal: General experience of comfort will improve Outcome: Progressing   Problem: Safety: Goal: Ability to remain free from injury will improve Outcome: Progressing   Problem: Skin Integrity: Goal: Risk for impaired skin integrity will decrease Outcome: Progressing

## 2021-09-15 NOTE — Progress Notes (Signed)
Progress Note  Patient Name: Derek Lowery Date of Encounter: 09/15/2021  [facility] Cardiologist: Skeet Latch, MD   Subjective   Continues to breathe better.  No significant chest discomfort.  Inpatient Medications    Scheduled Meds:  amoxicillin-clavulanate  1 tablet Oral Q12H   aspirin EC  81 mg Oral Daily   atorvastatin  40 mg Oral Daily   brimonidine  1 drop Right Eye BID   carvedilol  12.5 mg Oral BID WC   Chlorhexidine Gluconate Cloth  6 each Topical Daily   dorzolamide  1 drop Right Eye BID   enoxaparin (LOVENOX) injection  40 mg Subcutaneous Q24H   guaiFENesin  600 mg Oral BID   hydrALAZINE  100 mg Oral TID   insulin aspart  0-9 Units Subcutaneous TID AC & HS   insulin glargine-yfgn  10 Units Subcutaneous QHS   isosorbide mononitrate  60 mg Oral Daily   pantoprazole  40 mg Oral Daily   potassium chloride  40 mEq Oral Daily   senna  1 tablet Oral BID   sodium chloride flush  10-40 mL Intracatheter Q12H   timolol  1 drop Right Eye BID   Continuous Infusions:  sodium chloride Stopped (09/08/21 1437)   furosemide (LASIX) 200 mg in dextrose 5% 100 mL (56m/mL) infusion 15 mg/hr (09/15/21 0743)   PRN Meds: sodium chloride, acetaminophen **OR** acetaminophen, albuterol, ammonium lactate, docusate sodium, ondansetron (ZOFRAN) IV, polyethylene glycol, sodium chloride flush, traMADol   Vital Signs    Vitals:   09/14/21 2324 09/15/21 0400 09/15/21 0500 09/15/21 0746  BP: 121/68 (!) 146/74  122/64  Pulse: 69 70  70  Resp: 17 19    Temp: 97.9 F (36.6 C) 97.6 F (36.4 C)    TempSrc: Oral Oral    SpO2: 96% 95%    Weight:   104.7 kg   Height:        Intake/Output Summary (Last 24 hours) at 09/15/2021 0953 Last data filed at 09/14/2021 1346 Gross per 24 hour  Intake 10 ml  Output 400 ml  Net -390 ml   Last 3 Weights 09/15/2021 09/14/2021 09/13/2021  Weight (lbs) 230 lb 13.2 oz 234 lb 5.6 oz 248 lb 0.3 oz  Weight (kg) 104.7 kg 106.3 kg 112.5 kg        Physical Exam  GEN: Well nourished, well developed, in no acute distress HEENT: normal Neck: no JVD, carotid bruits, or masses Cardiac: RRR; no murmurs, rubs, or gallops,no edema  Respiratory:  clear to auscultation bilaterally, normal work of breathing GI: soft, nontender, nondistended, + BS MS: no deformity or atrophy Skin: warm and dry, no rash Neuro:  Alert and Oriented x 3, Strength and sensation are intact Psych: euthymic mood, full affect   Labs    High Sensitivity Troponin:   Recent Labs  Lab 09/07/21 1255 09/07/21 1757 09/07/21 2110 09/08/21 0217 09/08/21 0556  TROPONINIHS 141* 333* 714* 2,512* 4,145*     Chemistry Recent Labs  Lab 09/13/21 0420 09/14/21 0315 09/15/21 0237  NA 136 135 133*  K 3.8 3.7 3.8  CL 92* 90* 88*  CO2 34* 36* 37*  GLUCOSE 195* 222* 266*  BUN 74* 76* 76*  CREATININE 2.84* 2.88* 2.73*  CALCIUM 8.3* 8.5* 8.5*  MG 2.2 2.0 2.2  GFRNONAA 24* 24* 25*  ANIONGAP 10 9 8     Lipids  No results for input(s): CHOL, TRIG, HDL, LABVLDL, LDLCALC, CHOLHDL in the last 168 hours.   Hematology Recent Labs  Lab 09/12/21 0250 09/13/21 0420 09/14/21 0315 09/15/21 0237  WBC 7.0 7.3 9.6  --   RBC 2.68* 2.72* 2.80*  --   HGB 7.4* 7.5* 7.9* 7.6*  HCT 23.3* 23.6* 24.1* 23.8*  MCV 86.9 86.8 86.1  --   MCH 27.6 27.6 28.2  --   MCHC 31.8 31.8 32.8  --   RDW 14.0 13.8 13.7  --   PLT 176 194 208  --    Thyroid No results for input(s): TSH, FREET4 in the last 168 hours.  BNP Recent Labs  Lab 09/08/21 1403 09/09/21 0405 09/11/21 0516  BNP 1,081.8* 618.3* 964.9*    DDimer No results for input(s): DDIMER in the last 168 hours.   Radiology    DG Chest 1 View  Result Date: 09/13/2021 CLINICAL DATA:  Cough, follow-up. EXAM: CHEST  1 VIEW COMPARISON:  Chest x-ray 09/11/2021.  CT chest 09/07/2021. FINDINGS: Patchy bilateral mid and lower lung airspace disease has significantly decreased. Persistent minimal patchy and reticular opacities are  noted in the mid and lower lungs bilaterally. No pleural effusion or pneumothorax. Right upper extremity PICC terminates over the distal SVC. The cardiomediastinal silhouette is within normal limits. Osseous structures are stable. IMPRESSION: 1. Significant decrease in bilateral airspace disease. Electronically Signed   By: Ronney Asters M.D.   On: 09/13/2021 17:40    Cardiac Studies   EF 30 to 35% global hypokinesis.  Patient Profile     66 y.o. male here with acute systolic heart failure  Assessment & Plan    Acute systolic heart failure -Stopping IV Lasix drip.  Restarting torsemide however at double dose of 40 mg twice a day.  He was only taking 40 mg once a day at home.  Excellent overall diuresis.  Monitoring creatinine.  Fairly stable and 2.7 range. -Agree with considering Farxiga on discharge if able to tolerate from a renal perspective. -Amlodipine was discontinued. -Blood pressure well controlled on carvedilol isosorbide hydralazine.  Non-ST elevation myocardial infarction - Kidney function/anemia is a significant issue.  He is not having any ongoing chest discomfort.  Currently have him on goal-directed medical therapy.  Continue this. -No longer on anticoagulation secondary to hemoglobin drop.  Type 2 diabetes with hyperlipidemia - On atorvastatin.  Continue.  Anemia - Had a significant hemoglobin drop after starting IV heparin initially during this hospitalization.  It was felt that a GI consult was needed prior to invasive angiography.  At this point, I do not feel he is a candidate for invasive angiography where risks of bleeding likely outweigh benefits especially if dual antiplatelet therapy being utilized.  Clearly this is a challenging situation with his overall worsening renal function multifactorial from diabetes as well as systolic heart failure.  Hemoglobin now 7.6 quite decreased.  He is at high risk for cardiovascular morbidity and mortality.  CKD stage IV -  Consider nephrology evaluation.  For questions or updates, please contact [place] Please consult [email or web address] for contact info under        Signed, Candee Furbish, MD  09/15/2021, 9:53 AM

## 2021-09-15 NOTE — Progress Notes (Signed)
Progress Note   Patient: Derek Lowery YKD:[id number] DOB: Jan 02, 1956 DOA: 09/07/2021     8 DOS: the patient was seen and examined on 09/15/2021   Brief hospital course: Derek Lowery was admitted to the hospital with the working diagnosis of acute hypoxemic respiratory failure due to cardiogenic pulmonary edema, acute heart failure decompensation.    66 yo male with the past medical history of heart failure, T2DM, HTN, CAD (sp drug eluding stent to OM2 on 2012), and COPD who presented with dyspnea. Patient reported several days of not feeling well, with productive cough and progressive dyspnea. Because of worsening symptoms EMS was called and he was found hypoxic, he was placed on non re-breather mask and was transported to the hospital. On his initial physical examination his blood pressure was 154/86, HR 96, RR 27, T 98.6 and 02 saturation 85% to 93%, he had decreased breath sounds at the right base and had expiratory rhonchi, heart with S1 and S2 present and rhythmic, no gallops, or rubs, abdomen protuberant but not tender, positive lower extremity edema.   Na 139, K 3,5, CL 102, bicarbonate 27, glucose 165, bun 37 and cr 2,2 BNP 620 Wbc 6,6, hgb 9,6 hct 29 and plt 130 SARS COVID 19 negative   Urine analysis with SG 1,014 with 0-5 wbc   Chest radiograph with bilateral interstitial and alveolar infiltrates, diffuse.  CT cheat with bilateral ground glass opacities.   EKG with 96 bpm, right axis deviation, QTc 501 and right bundle branch block, sinus rhythm with no significant ST segment or T wave changes.   Patient developed worsening respiratory failure and was placed on heated high flow nasal cannula and then on on invasive mechanical ventilation.  Admitted to the ICU  Patient was placed on broad spectrum antibiotic therapy and diuresis with good toleration. Improved oxygen requirements.   Patient was diagnosed with NSTEMI and was placed on heparin drip.   01/27 transfer to  [facility]  Patient clinically improving follow up chest film from 01/27 with improvement in infiltrates.  Transfer to telemetry from progressive care.   01/29 transitioned to oral diuretic therapy, pending final recommendations from cardiology in regards of ischemic workup during this hospitalization.  Patient will need home health services at home and possible home 02.   Assessment and Plan * Acute respiratory failure with hypoxia (HCC) Dyspnea continue to improve, but not able to wean completely to room air.  Positive dyspnea on exertion.   His oxygenation today is 93 to 96%, on 1.5 L/min per [place]. Plan to continue oxygen saturation monitoring, keep 02 saturation 92% or greater may need home 02 at discharge.    Multifocal pneumonia- (present on admission) Patient has been afebrile, pulmonary infiltrates are improving.   Plan to continue antibiotic therapy with Augmentin, for a completer 8 days .  Continue with bronchodilator therapy and airway clearing techniques.  Out of bed to chair.    Acute systolic CHF (congestive heart failure) (HCC) Echocardiogram with EF 30 to 35% with mild global hypokinesis, with more severe hypokinesis in the inferior lateral and anterolateral ans apical myocardium. Mild reduction in RV systolic function.   Hypervolemia continue to improve, his urine output over last 24 hrs is 1,375 ml foley cathter has been removed.  Systolic blood pressure is 120 mmHg.  Medical  management with carvedilol, hydralazine and isosorbide No RAAS inhibition to prevent acute renal failure.   Continue diuresis with torsemide     Essential hypertension- (present on admission) Continue with  hydralazine, isosorbide and carvedilol for blood pressure control.     NSTEMI (non-ST elevated myocardial infarction) (Derek Lowery)- (present on admission) Patient with wall motion abnormalities on echocardiogram at the left ventricle suggestive ischemic event.  Continue with carvedilol, asa,  statin and isosorbide.   May need GI work up before ischemic work up, due to anemia while on heparin.  Note that patient had no melena, hematochezia or hematemesis during this hospitalization   Will follow with cardiology recommendations in regards of further ischemic workup during this hospitalization.   Type 2 diabetes mellitus with hyperlipidemia (HCC) On insulin regimen with basal insulin and sliding scale for glucose cover and monitoring  Basal insulin with 10 units of glargine.   Fasting glucose this am 266. Capillary 195 and 186   Continue with atorvastatin.   Acute kidney injury superimposed on CKD ([place]) CKD stage 3b/ hypokalemia   Renal function today with serum cr at 2,69mK is 3,8 and Na 133, with bicarbonate at 37.  Plan to continue diuresis with torsemide and follow renal function in am.   Anemia- (present on admission) Considering ischemic heart disease likely target will be a hgb around 8 or above. Iron panel with serum iron 35, TIBC 182, tranasferrin saturation of 19, ferritin 151, transferrin 130.  Consistent with anemia of chronic disease.   Hgb is 7,6 with Hct at 23,8,  Follow H&H in am if continue to drop will plan for PRBC transfusion in am     Subjective: Patient with improvement in dyspnea, no chest pain, no nausea or vomiting   Objective BP 122/63 (BP Location: Left Arm)    Pulse 69    Temp 97.6 F (36.4 C) (Oral)    Resp 19    Ht 5' 10"  (1.778 m)    Wt 104.7 kg    SpO2 93%    BMI 33.12 kg/m   Neurology awake and alert ENT mild pallor  Cardiovascular heart with S1 and S2 present with no gallop, no rubs,positive systolic murmur at the right sternal border. No JVD  Unna boots bilaterally lower extremities.   Pulmonary lungs with faint scattered rales with no wheezing or rhonchi  Abdominal soft and non tender Skin Musculoskeletal   Data Reviewed:    Family Communication: no family at the bedside   Disposition: Status is:  Inpatient  Remains inpatient appropriate because: heart failure management            Author: MTawni Millers MD 09/15/2021 3:34 PM  For on call review [email or web address]

## 2021-09-16 LAB — BASIC METABOLIC PANEL
Anion gap: 12 (ref 5–15)
BUN: 78 mg/dL — ABNORMAL HIGH (ref 8–23)
CO2: 35 mmol/L — ABNORMAL HIGH (ref 22–32)
Calcium: 8.9 mg/dL (ref 8.9–10.3)
Chloride: 88 mmol/L — ABNORMAL LOW (ref 98–111)
Creatinine, Ser: 2.85 mg/dL — ABNORMAL HIGH (ref 0.61–1.24)
GFR, Estimated: 24 mL/min — ABNORMAL LOW (ref >60)
Glucose, Bld: 185 mg/dL — ABNORMAL HIGH (ref 70–99)
Potassium: 3.7 mmol/L (ref 3.5–5.1)
Sodium: 135 mmol/L (ref 135–145)

## 2021-09-16 LAB — COOXEMETRY PANEL
Carboxyhemoglobin: 1.8 % — ABNORMAL HIGH (ref 0.5–1.5)
Methemoglobin: 1 % (ref 0.0–1.5)
O2 Saturation: 67.8 %
Total hemoglobin: 8.3 g/dL — ABNORMAL LOW (ref 12.0–16.0)

## 2021-09-16 LAB — HEMOGLOBIN AND HEMATOCRIT, BLOOD
HCT: 25.4 % — ABNORMAL LOW (ref 39.0–52.0)
Hemoglobin: 8.2 g/dL — ABNORMAL LOW (ref 13.0–17.0)

## 2021-09-16 LAB — GLUCOSE, CAPILLARY
Glucose-Capillary: 172 mg/dL — ABNORMAL HIGH (ref 70–99)
Glucose-Capillary: 245 mg/dL — ABNORMAL HIGH (ref 70–99)

## 2021-09-16 MED ORDER — PANTOPRAZOLE SODIUM 40 MG PO TBEC: 40.0000 mg | DELAYED_RELEASE_TABLET | Freq: Every day | ORAL | 0 refills | Status: DC

## 2021-09-16 MED ORDER — CARVEDILOL 12.5 MG PO TABS: 12.5000 mg | ORAL_TABLET | Freq: Two times a day (BID) | ORAL | 0 refills | Status: DC

## 2021-09-16 MED ORDER — ACETAMINOPHEN 325 MG PO TABS: 650.0000 mg | ORAL_TABLET | Freq: Four times a day (QID) | ORAL | Status: AC | PRN

## 2021-09-16 MED ORDER — ISOSORBIDE MONONITRATE ER 60 MG PO TB24: 60.0000 mg | ORAL_TABLET | Freq: Every day | ORAL | 0 refills | Status: DC

## 2021-09-16 MED ORDER — HYDRALAZINE HCL 100 MG PO TABS: 100.0000 mg | ORAL_TABLET | Freq: Three times a day (TID) | ORAL | 0 refills | Status: DC

## 2021-09-16 MED ORDER — TORSEMIDE 40 MG PO TABS: 40.0000 mg | ORAL_TABLET | Freq: Two times a day (BID) | ORAL | 0 refills | Status: DC

## 2021-09-16 MED ORDER — LANTUS SOLOSTAR 100 UNIT/ML ~~LOC~~ SOPN: 10.0000 [IU] | PEN_INJECTOR | Freq: Every day | SUBCUTANEOUS | 0 refills | Status: DC

## 2021-09-16 NOTE — Progress Notes (Signed)
Progress Note  Patient Name: Derek Lowery Date of Encounter: 09/16/2021  [facility] Cardiologist: Skeet Latch, MD   Subjective   Not having any chest discomfort.  Shortness of breath close to baseline.  Inpatient Medications    Scheduled Meds:  amoxicillin-clavulanate  1 tablet Oral Q12H   aspirin EC  81 mg Oral Daily   atorvastatin  40 mg Oral Daily   brimonidine  1 drop Right Eye BID   carvedilol  12.5 mg Oral BID WC   Chlorhexidine Gluconate Cloth  6 each Topical Daily   dorzolamide  1 drop Right Eye BID   enoxaparin (LOVENOX) injection  40 mg Subcutaneous Q24H   guaiFENesin  600 mg Oral BID   hydrALAZINE  100 mg Oral TID   insulin aspart  0-9 Units Subcutaneous TID AC & HS   insulin glargine-yfgn  10 Units Subcutaneous QHS   isosorbide mononitrate  60 mg Oral Daily   pantoprazole  40 mg Oral Daily   potassium chloride  40 mEq Oral Daily   senna  1 tablet Oral BID   sodium chloride flush  10-40 mL Intracatheter Q12H   timolol  1 drop Right Eye BID   torsemide  40 mg Oral BID   Continuous Infusions:  sodium chloride Stopped (09/08/21 1437)   PRN Meds: sodium chloride, acetaminophen **OR** acetaminophen, albuterol, ammonium lactate, docusate sodium, ondansetron (ZOFRAN) IV, polyethylene glycol, sodium chloride flush, traMADol   Vital Signs    Vitals:   09/15/21 2137 09/15/21 2342 09/16/21 0500 09/16/21 0810  BP: 134/68 116/61 133/65 (!) 143/65  Pulse: 71 69 66 65  Resp: 17 18 17 16   Temp: 97.7 F (36.5 C) 98.1 F (36.7 C) 97.6 F (36.4 C) (!) 97.5 F (36.4 C)  TempSrc: Oral Oral Oral Oral  SpO2: 97% 96% 96% 98%  Weight:   103.9 kg   Height:        Intake/Output Summary (Last 24 hours) at 09/16/2021 0907 Last data filed at 09/15/2021 2014 Gross per 24 hour  Intake 240 ml  Output --  Net 240 ml   Last 3 Weights 09/16/2021 09/15/2021 09/14/2021  Weight (lbs) 229 lb 230 lb 13.2 oz 234 lb 5.6 oz  Weight (kg) 103.874 kg 104.7 kg 106.3 kg        Physical Exam    GEN: Well nourished, well developed, in no acute distress HEENT: normal Neck: no JVD, carotid bruits, or masses Cardiac: RRR; no murmurs, rubs, or gallops,no edema  Respiratory:  clear to auscultation bilaterally, normal work of breathing GI: soft, nontender, nondistended, + BS MS: no deformity or atrophy Skin: warm and dry, no rash Neuro:  Alert and Oriented x 3, Strength and sensation are intact Psych: euthymic mood, full affect    Labs    High Sensitivity Troponin:   Recent Labs  Lab 09/07/21 1255 09/07/21 1757 09/07/21 2110 09/08/21 0217 09/08/21 0556  TROPONINIHS 141* 333* 714* 2,512* 4,145*     Chemistry Recent Labs  Lab 09/13/21 0420 09/14/21 0315 09/15/21 0237 09/16/21 0536  NA 136 135 133* 135  K 3.8 3.7 3.8 3.7  CL 92* 90* 88* 88*  CO2 34* 36* 37* 35*  GLUCOSE 195* 222* 266* 185*  BUN 74* 76* 76* 78*  CREATININE 2.84* 2.88* 2.73* 2.85*  CALCIUM 8.3* 8.5* 8.5* 8.9  MG 2.2 2.0 2.2  --   GFRNONAA 24* 24* 25* 24*  ANIONGAP 10 9 8 12     Lipids  No results for input(s):  CHOL, TRIG, HDL, LABVLDL, LDLCALC, CHOLHDL in the last 168 hours.   Hematology Recent Labs  Lab 09/12/21 0250 09/13/21 0420 09/14/21 0315 09/15/21 0237 09/16/21 0536  WBC 7.0 7.3 9.6  --   --   RBC 2.68* 2.72* 2.80*  --   --   HGB 7.4* 7.5* 7.9* 7.6* 8.2*  HCT 23.3* 23.6* 24.1* 23.8* 25.4*  MCV 86.9 86.8 86.1  --   --   MCH 27.6 27.6 28.2  --   --   MCHC 31.8 31.8 32.8  --   --   RDW 14.0 13.8 13.7  --   --   PLT 176 194 208  --   --    Thyroid No results for input(s): TSH, FREET4 in the last 168 hours.  BNP Recent Labs  Lab 09/11/21 0516  BNP 964.9*    DDimer No results for input(s): DDIMER in the last 168 hours.   Radiology    No results found.  Cardiac Studies   EF 30 to 35% global hypokinesis.  Patient Profile     66 y.o. male here with acute systolic heart failure  Assessment & Plan    Acute systolic heart failure - Yesterday  restarting torsemide however at double dose of 40 mg twice a day.  He was only taking 40 mg once a day at home.  Excellent overall diuresis.  Monitoring creatinine.  Fairly stable and 2.7 - 2.9 range. -Agree with considering Farxiga on discharge if able to tolerate from a renal perspective. -Amlodipine was discontinued. -Blood pressure well controlled on carvedilol isosorbide hydralazine.  Non-ST elevation myocardial infarction - Kidney function/anemia is a significant issue.  He is not having any ongoing chest discomfort.  Currently have him on goal-directed medical therapy.  Continue this. -No further ischemic work-up in the hospital setting.  See below. -No longer on anticoagulation secondary to hemoglobin drop.  Type 2 diabetes with hyperlipidemia - On atorvastatin.  Continue.  Anemia - Had a significant hemoglobin drop after starting IV heparin initially during this hospitalization.  It was felt that a GI consult was needed prior to invasive angiography.  At this point, I do not feel he is a candidate for invasive angiography where risks of bleeding likely outweigh benefits especially if dual antiplatelet therapy being utilized.  No further ischemic work-up here in the hospital.  Revisit as an outpatient.  Continue with goal-directed medical therapy/medical management.  Clearly this is a challenging situation with his overall worsening renal function multifactorial from diabetes as well as systolic heart failure.  Hemoglobin now 7.6 quite decreased.  He is at high risk for cardiovascular morbidity and mortality.  CKD stage IV - Consider nephrology evaluation.  For questions or updates, please contact [place] Please consult [email or web address] for contact info under        Signed, Candee Furbish, MD  09/16/2021, 9:07 AM

## 2021-09-16 NOTE — TOC Progression Note (Signed)
Transition of Care [facility]) - Progression Note    Patient Details  Name: CAN LUCCI MRN: [id number] Date of Birth: 1956-01-31  Transition of [facility]) CM/SW [place], RN Phone [phone]  09/16/2021, 1:29 PM  Clinical Narrative:    CM recieved consult for Home O2. Home O2 has need ordered through [facility] and will be delivered to the bedside.    Expected Discharge Plan: [place] Barriers to Discharge: Continued Medical Work up  Expected Discharge Plan and Services Expected Discharge Plan: [place] In-house Referral: Clinical Social Work Discharge Planning Services: CM Consult Post Acute Care Choice: [place] arrangements for the past 2 months: Single Family Home Expected Discharge Date: 09/16/21               DME Arranged: N/A DME Agency: NA       HH Arranged: RN, Disease Management, PT, OT HH Agency: [place] Date [facility] Agency Contacted: 09/13/21 Time HH Agency Contacted: 5 Representative spoke with at [employer]: [place] ([place]) Interventions    Readmission Risk Interventions Readmission Risk Prevention Plan 09/11/2021  Transportation Screening Complete  PCP or Specialist Appt within 3-5 Days Complete  HRI or [place] Complete  Social Work Consult for [place] Planning/Counseling Complete  Palliative Care Screening Not Applicable  Medication Review [employer]) Complete  Some recent data might be hidden

## 2021-09-16 NOTE — Care Management Important Message (Signed)
Important Message  Patient Details  Name: Derek Lowery MRN: [id number] Date of Birth: 11/10/1955   Medicare Important Message Given:  Yes     Fredda Clarida 09/16/2021, 1:50 PM

## 2021-09-16 NOTE — Progress Notes (Signed)
Occupational Therapy Treatment Patient Details Name: Derek Lowery MRN: [id number] DOB: [birth year] Today's Date: 09/16/2021   History of present illness 66 y/o male admitted 1/21 due to multifocal PNA and NSTEMI. Pt with increasing supplemental O2 needs and transferred to ICU 1/22. PMH: CHF, CAD, HTN, DM, HLD, glaucoma.   OT comments  Pt progressing towards established OT goals; planning for dc later today. Providing education and handout on energy conservation for ADLs and IADLs; pt verbalized understanding and way he can implement into his daily routine. Establishing HEP for UE with theraband (level one); pt demonstrating understanding. Providing education on safe tub transfer and pt reports this is how he normally transfers in/out of tub. Answered questions in preparation for dc. Continue to recommend dc to home once medically stable.    Recommendations for follow up therapy are one component of a multi-disciplinary discharge planning process, led by the attending physician.  Recommendations may be updated based on patient status, additional functional criteria and insurance authorization.    Follow Up Recommendations  No OT follow up    Assistance Recommended at Discharge Intermittent Supervision/Assistance  Patient can return home with the following  Assistance with cooking/housework;Assist for transportation;Help with stairs or ramp for entrance   Equipment Recommendations  None recommended by OT    Recommendations for Other Services      Precautions / Restrictions Precautions Precautions: Fall       Mobility Bed Mobility               General bed mobility comments: Sitting at EOB upon arrival    Transfers                         Balance Overall balance assessment: Needs assistance Sitting-balance support: No upper extremity supported, Feet supported Sitting balance-Leahy Scale: Good                                     ADL either  performed or assessed with clinical judgement   ADL Overall ADL's : Needs assistance/impaired                       Lower Body Dressing Details (indicate cue type and reason): Recommending pt bring BLES up to him vs bending forward           Tub/Shower Transfer Details (indicate cue type and reason): Providing education on compensatory technique for tub transfer with side stepping   General ADL Comments: Focused session on [facility] handout and education and establishing HEP. Pt performing exercises and demonstrating understanding. Reviewing EC handout and discussing ways to impliment into pts daily routine; pt verbalized understanding.    Extremity/Trunk Assessment Upper Extremity Assessment Upper Extremity Assessment: Generalized weakness   Lower Extremity Assessment Lower Extremity Assessment: Defer to PT evaluation        Vision   Vision Assessment?: No apparent visual deficits   Perception     Praxis      Cognition Arousal/Alertness: Awake/alert Behavior During Therapy: WFL for tasks assessed/performed Overall Cognitive Status: Within Functional Limits for tasks assessed                                 General Comments: Very agreeable to therapy        Exercises Exercises: General Upper Extremity General Exercises -  Upper Extremity Shoulder Horizontal ABduction: Strengthening, Right, Left, 10 reps, Seated, Theraband Theraband Level (Shoulder Horizontal Abduction): Level 1 (Yellow) Shoulder Horizontal ADduction: Strengthening, Right, Left, 10 reps, Seated, Theraband Theraband Level (Shoulder Horizontal Adduction): Level 1 (Yellow) Elbow Flexion: Strengthening, Right, Left, 10 reps, Seated, Theraband Theraband Level (Elbow Flexion): Level 1 (Yellow) Elbow Extension: Strengthening, Right, Left, 10 reps, Seated, Theraband Theraband Level (Elbow Extension): Level 1 (Yellow)    Shoulder Instructions       General Comments Spo2 90s on RA     Pertinent Vitals/ Pain       Pain Assessment Pain Assessment: Faces Faces Pain Scale: No hurt Pain Intervention(s): Monitored during session  Home Living                                          Prior Functioning/Environment              Frequency  Min 2X/week        Progress Toward Goals  OT Goals(current goals can now be found in the care plan section)  Progress towards OT goals: Progressing toward goals  Acute Rehab OT Goals OT Goal Formulation: With patient Time For Goal Achievement: 09/19/21 Potential to Achieve Goals: Good ADL Goals Pt Will Perform Lower Body Dressing: with modified independence;sitting/lateral leans;sit to/from stand Pt Will Transfer to Toilet: with modified independence;ambulating Pt/caregiver will Perform Home Exercise Program: Increased strength;Both right and left upper extremity;With theraband;Independently;With written HEP provided Additional ADL Goal #1: Pt to verbalize at least 3 energy conservation strategies to implement during ADL routine at home Additional ADL Goal #2: Pt to increase activity tolerance > 7 min to maximize endurance for daily tasks  Plan Discharge plan remains appropriate    Co-evaluation                 AM-PAC OT "6 Clicks" Daily Activity     Outcome Measure   Help from another person eating meals?: None Help from another person taking care of personal grooming?: A Little Help from another person toileting, which includes using toliet, bedpan, or urinal?: A Little Help from another person bathing (including washing, rinsing, drying)?: A Little Help from another person to put on and taking off regular upper body clothing?: A Little Help from another person to put on and taking off regular lower body clothing?: A Little 6 Click Score: 19    End of Session Equipment Utilized During Treatment: Other (comment) (theraband)  OT Visit Diagnosis: Unsteadiness on feet (R26.81);Muscle  weakness (generalized) (M62.81)   Activity Tolerance Patient tolerated treatment well   Patient Left in bed;with call bell/phone within reach   Nurse Communication Mobility status        Time: 8937-3428 OT Time Calculation (min): 20 min  Charges: OT General Charges $OT Visit: 1 Visit OT Treatments $Therapeutic Exercise: 8-22 mins  [place], OTR/L Acute Rehab Pager: [phone] Office: [place] 09/16/2021, 2:08 PM

## 2021-09-16 NOTE — Progress Notes (Signed)
Inpatient Diabetes Program Recommendations  AACE/ADA: New Consensus Statement on Inpatient Glycemic Control (2015)  Target Ranges:  Prepandial:   less than 140 mg/dL      Peak postprandial:   less than 180 mg/dL (1-2 hours)      Critically ill patients:  140 - 180 mg/dL   Lab Results  Component Value Date   GLUCAP 172 (H) 09/16/2021   HGBA1C 7.1 (H) 09/08/2021    Review of Glycemic Control  Latest Reference Range & Units 09/15/21 11:13 09/15/21 16:27 09/15/21 21:32 09/16/21 06:06  Glucose-Capillary 70 - 99 mg/dL 186 (H) 212 (H) 224 (H) 172 (H)  (H): Data is abnormally high Diabetes history: Type 2 DM Outpatient Diabetes medications: Humalog 10 units TID, Lantus 38 units QHS Current orders for Inpatient glycemic control: Novolog 0-9 units TID & HS, Semglee 10 units QHS  Inpatient Diabetes Program Recommendations:    Consider increasing Semglee to 16 units QHS.  Would NOT recommend SGLT-2 on discharge- history of Fournier's gangrene 2015.  Thanks, Bronson Curb, MSN, RNC-OB Diabetes Coordinator [phone] (8a-5p)

## 2021-09-16 NOTE — Plan of Care (Signed)
°  Problem: Education: Goal: Ability to demonstrate management of disease process will improve Outcome: Progressing Goal: Ability to verbalize understanding of medication therapies will improve Outcome: Progressing Goal: Individualized Educational Video(s) Outcome: Progressing   Problem: Activity: Goal: Capacity to carry out activities will improve Outcome: Progressing   Problem: Cardiac: Goal: Ability to achieve and maintain adequate cardiopulmonary perfusion will improve Outcome: Progressing   Problem: Activity: Goal: Ability to tolerate increased activity will improve Outcome: Progressing   Problem: Clinical Measurements: Goal: Ability to maintain a body temperature in the normal range will improve Outcome: Progressing   Problem: Respiratory: Goal: Ability to maintain adequate ventilation will improve Outcome: Progressing Goal: Ability to maintain a clear airway will improve Outcome: Progressing   Problem: Education: Goal: Knowledge of General Education information will improve Description: Including pain rating scale, medication(s)/side effects and non-pharmacologic comfort measures Outcome: Progressing   Problem: Health Behavior/Discharge Planning: Goal: Ability to manage health-related needs will improve Outcome: Progressing   Problem: Clinical Measurements: Goal: Ability to maintain clinical measurements within normal limits will improve Outcome: Progressing Goal: Will remain free from infection Outcome: Progressing Goal: Diagnostic test results will improve Outcome: Progressing Goal: Respiratory complications will improve Outcome: Progressing Goal: Cardiovascular complication will be avoided Outcome: Progressing   Problem: Activity: Goal: Risk for activity intolerance will decrease Outcome: Progressing   Problem: Nutrition: Goal: Adequate nutrition will be maintained Outcome: Progressing   Problem: Coping: Goal: Level of anxiety will  decrease Outcome: Progressing   Problem: Elimination: Goal: Will not experience complications related to bowel motility Outcome: Progressing Goal: Will not experience complications related to urinary retention Outcome: Progressing   Problem: Pain Managment: Goal: General experience of comfort will improve Outcome: Progressing   Problem: Safety: Goal: Ability to remain free from injury will improve Outcome: Progressing   Problem: Skin Integrity: Goal: Risk for impaired skin integrity will decrease Outcome: Progressing

## 2021-09-16 NOTE — Progress Notes (Signed)
Heart Failure Nurse Navigator Progress Note  Assessed for HV TOC readiness. Pt agreeable to HV TOC clinic. 2/7 @ 11AM. Pt states his family helps with transportation. Agreeable to enrollment in [employer]. Completed SDoH. Utilizes walker. Leaving hospital with oxygen. Has scale at home, encouraged daily log.   EF: 30-35%, severe hypokinesis, G2DD  Pricilla Holm, MSN, RN Heart Failure Nurse Navigator [phone]

## 2021-09-16 NOTE — Progress Notes (Signed)
Procedure was explained to patient prior to line removal. HOB was lowered to tolerable level for patient (14 degrees). Patient was instructed to hold his breath during line removal and was able to comply. Line was removed without difficulty or resistance. Dressing prior to removal was clean,dry, and intact. Site was noted to have bruising, but was free from redness or swelling. Pressure was held to insertion site with Vaseline gauze and 2x2 gauze for 5 minutes, then a Tegaderm occlusive dressing was applied to cover gauze. Patient was instructed to leave dressing in place for 24-48 hours then remove. He was instructed that he is able to shower, but do not submerge or soak the PICC line insertion site to the RUE for at least 5 days or until a scab is well formed. Patient verbalized understanding of instructions. Patient was instructed to remain in bed for total of 30 minutes following line removal and again verbalized understanding. New dressing is clean/dry/and intact.

## 2021-09-16 NOTE — Discharge Summary (Addendum)
Physician Discharge Summary  Derek Lowery KGU:[id number] DOB: 14-Mar-1956 DOA: 09/07/2021  PCP: Derek Pier, MD  Admit date: 09/07/2021 Discharge date: 09/16/2021  Admitted From: home  Disposition:  Home  Recommendations for Outpatient Follow-up and new medication changes:  Follow up with Dr.  Wynetta Lowery in 7 to 10 days.  Patient has completed antibiotic therapy during his hospitalization. Continue with supplemental 02 at home.  Patient has been placed on carvedilol, hydralazine and isosorbide. Increased torsemide to 40 mg bid to keep negative fluid balance.  Follow up renal function and electrolytes as outpatient.  Not on empagliflozin due to history of Fournier's gangrene   Home Health: yes   Equipment/Devices: home 02    Discharge Condition: stable  CODE STATUS: full  Diet recommendation:  heart healthy   Brief/Interim Summary: Derek Lowery was admitted to the hospital with the working diagnosis of acute hypoxemic respiratory failure due to cardiogenic pulmonary edema, acute heart failure decompensation.  Community acquired pneumonia.     66 yo male with the past medical history of heart failure, T2DM, HTN, CAD (sp drug eluding stent to OM2 on 2012), and COPD who presented with dyspnea. Patient reported several days of not feeling well, with productive cough and progressive dyspnea. Because of worsening symptoms EMS was called and he was found hypoxic, he was placed on non re-breather mask and was transported to the hospital. On his initial physical examination his blood pressure was 154/86, HR 96, RR 27, T 98.6 and 02 saturation 85% to 93%, he had decreased breath sounds at the right base and had expiratory rhonchi, heart with S1 and S2 present and rhythmic, no gallops, or rubs, abdomen protuberant but not tender, positive lower extremity edema.    Na 139, K 3,5, CL 102, bicarbonate 27, glucose 165, bun 37 and cr 2,2 BNP 620 Wbc 6,6, hgb 9,6 hct 29 and plt 130 SARS COVID 19  negative    Urine analysis with SG 1,014 with 0-5 wbc    Chest radiograph with bilateral interstitial and alveolar infiltrates, diffuse.   CT cheat with bilateral ground glass opacities.    EKG with 96 bpm, right axis deviation, QTc 501 and right bundle branch block, sinus rhythm with no significant ST segment or T wave changes.    Patient developed worsening respiratory failure and was placed on heated high flow nasal cannula and then on non invasive mechanical ventilation.  Admitted to the ICU   Patient was placed on broad spectrum antibiotic therapy and aggressive diuresis with good toleration. Improved oxygen requirements and    Patient was diagnosed with NSTEMI and was placed on heparin drip. Because drop in hemoglobin his anticoagulation was discontinued.    01/27 transfer to Derek Lowery   Patient clinically improving follow up chest film from 01/27 with improvement in infiltrates.  Transfer to telemetry from progressive care.    01/29 transitioned to oral diuretic therapy. Plan to hold on ischemic work up at this point due to renal failure and high risk for deterioration. Plan to follow up as outpatient with cardiology.   Patient will need home health services at home with home 02.     Acute respiratory failure with hypoxia ([place]) Initially admitted to the ICU where patient required non invasive mechanical ventilation and heated high flow nasal cannula to maintain oxygenation 92% or greater.  Patient's oxygen requirements have improved with medical therapy, and at the time of his discharge he is using 1 L/min per Derek Lowery with good toleration. Patient will  need home 02 due to oxygen desaturation on room air.  Follow up chest radiograph with improvement in infiltrates.   Cause of hypoxemic respiratory failure pneumonia and acute cardiogenic pulmonary edema (present on admission).    2. Multifocal pneumonia- (present on admission) Patient completed antibiotic therapy during his  hospitalization. He was treated with ceftriaxone and azithromycin, then transitioned to Augmentin.  Continue to have cough but is improved.   3. Acute systolic CHF (congestive heart failure) (HCC) Patient was diagnosed with decompensated acute systolic heart failure and was placed on aggressive diuresis with furosemide IV  Further work up with echocardiogram showed LV EF 30 to 35% with mild global hypokinesis, with more severe hypokinesis in the inferior lateral and anterolateral ans apical myocardium. Mild reduction in RV systolic function.    Negative fluid balance was achieved, 19,298 ml since admission with significant improvement of his symptoms. Continue heart failure management with carvedilol, hydralazine and isosorbide Not on RAAS inhibition to prevent worsening acute renal failure.    Patient will be discharged on a higher dose of torsemide at home of 40 mg bid.  Follow as outpatient.    3. Essential hypertension- (present on admission) Patient will be discharged on hydralazine, isosorbide and carvedilol for blood pressure control.    4. NSTEMI (non-ST elevated myocardial infarction) ([place])- (present on admission) Patient with wall motion abnormalities on echocardiogram at the left ventricle suggestive ischemic event. Patient was placed on heparin for anticoagulation but developed acute anemia, heparin was discontinue.  He was continue with aggressive medical therapy with aspirin, statin and b blocker. At this point patient seems to be high risk for further invasive ischemic work up and potential treatments.  Plan to follow up as outpatient.     5. Type 2 diabetes mellitus with hyperlipidemia (HCC) Patient's dose of insulin has been decrease due risk of hypoglycemia.  At her discharge his fasting glucose is 185.    Will hold on pre-meal insulin for now. Will need close follow up as outpatient.    On Atorvastatin.    6. Acute kidney injury superimposed on CKD ([place]) CKD  stage 3b/ hypokalemia    Patient had aggressive diuresis with IV furosemide, peak Cr 3.32. his K was corrected with KCK supplementation. At the time of his discharge his renal function has a serum cr of 2.85, with K at 3,7 and serum bicarbonate of 35.  Plan to continue with oral torsemide, will hod on K supplementation for now, with plan to follow up electrolytes as outpatient.    7. Acute on chronic anemia- (present on admission) His Iron panel with serum iron 35, TIBC 182, tranasferrin saturation of 19, ferritin 151, transferrin 130.  Consistent with anemia of chronic disease.   Patient did not require PRBC transfusion, no signs of frank bleeding. Patient was placed on pantoprazole with good toleration. His discharge hgb is 8,2 with hct 25,4.  Will recommend to keep Hgb around 8 considering his coronary artery disease.      Discharge Diagnoses:  Principal Problem:   Acute respiratory failure with hypoxia (HCC) Active Problems:   Multifocal pneumonia   Acute systolic CHF (congestive heart failure) (HCC)   NSTEMI (non-ST elevated myocardial infarction) [facility])   Essential hypertension   Type 2 diabetes mellitus with hyperlipidemia ([place])   Acute kidney injury superimposed on CKD ([place])   Anemia    Discharge Instructions   Allergies as of 09/16/2021       Reactions   Coreg [carvedilol] Other (See Comments)  Redness and swelling of calf        Medication List     STOP taking these medications    amLODipine 10 MG tablet Commonly known as: NORVASC   HumaLOG KwikPen 100 UNIT/ML KwikPen Generic drug: insulin lispro       TAKE these medications    acetaminophen 325 MG tablet Commonly known as: TYLENOL Take 2 tablets (650 mg total) by mouth every 6 (six) hours as needed for mild pain (or Fever >/= 101).   ammonium lactate 12 % lotion Commonly known as: AmLactin Apply to both feet twice daily for dry skin. What changed:  how much to take when to take  this reasons to take this   aspirin EC 81 MG tablet Take 1 tablet (81 mg total) by mouth daily.   atorvastatin 40 MG tablet Commonly known as: LIPITOR TAKE 1 TABLET BY MOUTH EVERY DAY What changed: additional instructions   BD Pen Needle Nano 2nd Gen 32G X 4 MM Misc Generic drug: Insulin Pen Needle USE AS DIRECTED 3 TIMES A DAY. PLEASE SCHEDULE AN APPOINTMENT FOR ADDITIONAL REFILLS.   blood glucose meter kit and supplies Kit Dispense based on patient and insurance preference. Use up to four times daily as directed. One Touch Verio   brimonidine 0.2 % ophthalmic solution Commonly known as: ALPHAGAN Place 1 drop into the right eye 2 (two) times daily.   carvedilol 12.5 MG tablet Commonly known as: COREG Take 1 tablet (12.5 mg total) by mouth 2 (two) times daily with a meal.   dorzolamide 2 % ophthalmic solution Commonly known as: TRUSOPT Place 1 drop into the right eye 2 (two) times daily.   FreeStyle Libre 14 Day Reader Kerrin Mo USE AS DIRECTED   Dexcom G6 Receiver Devi 1 Device by Does not apply route daily.   FreeStyle [employer] System Misc Change sensor Q 2 wks   Dexcom G6 Sensor Misc 1 packet by Does not apply route daily.   glucose blood test strip Commonly known as: Accu-Chek Aviva Plus Use as instructed for 3 times daily testing of blood sugar. E11.9   glucose monitoring kit monitoring kit 1 each by Does not apply route as needed for other.   hydrALAZINE 100 MG tablet Commonly known as: APRESOLINE Take 1 tablet (100 mg total) by mouth 3 (three) times daily. What changed:  medication strength how much to take   isosorbide mononitrate 60 MG 24 hr tablet Commonly known as: IMDUR Take 1 tablet (60 mg total) by mouth daily. Start taking on: September 17, 2021   Lancets Misc Use as directed.  Accu chek 2   Lantus SoloStar 100 UNIT/ML Solostar Pen Generic drug: insulin glargine Inject 10 Units into the skin at bedtime. What changed: how much to take    latanoprost 0.005 % ophthalmic solution Commonly known as: XALATAN Place 1 drop into the right eye at bedtime.   multivitamin with minerals tablet Take 1 tablet by mouth daily.   pantoprazole 40 MG tablet Commonly known as: PROTONIX Take 1 tablet (40 mg total) by mouth daily for 14 days. Start taking on: September 17, 2021   Rhopressa 0.02 % Soln Generic drug: Netarsudil Dimesylate Place 1 drop into the right eye at bedtime.   timolol 0.5 % ophthalmic solution Commonly known as: TIMOPTIC Place 1 drop into the right eye 2 (two) times daily.   Torsemide 40 MG Tabs Take 40 mg by mouth 2 (two) times daily. What changed:  medication strength how much to  take how to take this when to take this additional instructions               Durable Medical Equipment  (From admission, onward)           Start     Ordered   09/16/21 1011  For home use only DME oxygen  Once       Question Answer Comment  Length of Need 6 Months   Mode or (Route) Nasal cannula   Liters per Minute 2   Frequency Continuous (stationary and portable oxygen unit needed)   Oxygen conserving device Yes   Oxygen delivery system Gas      09/16/21 1010            Allergies  Allergen Reactions   Coreg [Carvedilol] Other (See Comments)    Redness and swelling of calf    Consultations: Cardiology    Procedures/Studies: DG Chest 1 View  Result Date: 09/13/2021 CLINICAL DATA:  Cough, follow-up. EXAM: CHEST  1 VIEW COMPARISON:  Chest x-ray 09/11/2021.  CT chest 09/07/2021. FINDINGS: Patchy bilateral mid and lower lung airspace disease has significantly decreased. Persistent minimal patchy and reticular opacities are noted in the mid and lower lungs bilaterally. No pleural effusion or pneumothorax. Right upper extremity PICC terminates over the distal SVC. The cardiomediastinal silhouette is within normal limits. Osseous structures are stable. IMPRESSION: 1. Significant decrease in bilateral  airspace disease. Electronically Signed   By: Ronney Asters M.D.   On: 09/13/2021 17:40   CT Chest Wo Contrast  Result Date: 09/07/2021 CLINICAL DATA:  Evaluate pneumonia. EXAM: CT CHEST WITHOUT CONTRAST TECHNIQUE: Multidetector CT imaging of the chest was performed following the standard protocol without IV contrast. RADIATION DOSE REDUCTION: This exam was performed according to the departmental dose-optimization program which includes automated exposure control, adjustment of the mA and/or kV according to patient size and/or use of iterative reconstruction technique. COMPARISON:  Chest radiograph from earlier today FINDINGS: Cardiovascular: Mild cardiac enlargement. Aortic atherosclerosis. 3 vessel coronary artery calcifications. No pericardial effusion. Mediastinum/Nodes: Normal appearance of the thyroid gland. The trachea appears patent and is midline. Normal appearance of the esophagus. No enlarged axillary or supraclavicular lymph nodes. Prominent mediastinal lymph nodes are identified. Index right paratracheal lymph node measures 1.5 cm, image 55/3. Left pre-vascular lymph node measures 1.2 cm, image 53/5. The hilar lymph nodes are suboptimally evaluated due to lack of IV contrast material. Lungs/Pleura: Trace bilateral pleural effusions. There is interlobular septal thickening within both lungs compatible with interstitial edema. Multifocal patchy areas of ground-glass and airspace consolidation are noted within both lungs. This is most severe within the posterior right upper lobe and posteromedial right lower lobe. Upper Abdomen: No acute abnormality within the imaged portions of the upper abdomen. Aortic atherosclerosis. Status post cholecystectomy. Musculoskeletal: No chest wall mass or suspicious bone lesions identified. IMPRESSION: 1. Multifocal patchy areas of ground-glass and airspace consolidation are noted within both lungs, most severe within the posterior right upper lobe and posteromedial  right lower lobe. Findings are favored to represent multifocal pneumonia. Follow-up imaging is recommended to ensure resolution. 2. Cardiac enlargement, interstitial edema and trace bilateral pleural effusions compatible with CHF. 3. 3 vessel coronary artery calcifications. 4. Mildly enlarged mediastinal lymph nodes, nonspecific in the setting of multifocal. Infection and CHF. 5. Aortic Atherosclerosis (ICD10-I70.0). Electronically Signed   By: Kerby Moors M.D.   On: 09/07/2021 13:03   US RENAL  Result Date: 09/10/2021 CLINICAL DATA:  A 66 year old male presents with  acute kidney injury. EXAM: RENAL / URINARY TRACT ULTRASOUND COMPLETE COMPARISON:  Prior renal imaging from November of 2021 and CT from February of 2019 FINDINGS: Right Kidney: Renal measurements: 11.3 x 5.9 x 5.4 cm = volume: 187 mL. Mild increased cortical echogenicity with parenchymal thinning and cortical scarring. No hydronephrosis. Left Kidney: Renal measurements: 12.8 x 6.4 x 6.3 cm = volume: 272 mL. Increased cortical echogenicity without hydronephrosis. Signs of mild cortical scarring and parenchymal thinning Bladder: Decompressed with Foley catheter, not well evaluated. Other: None. IMPRESSION: Signs of medical renal disease without hydronephrosis. Electronically Signed   By: Zetta Bills M.D.   On: 09/10/2021 11:23   DG Chest Port 1 View  Result Date: 09/11/2021 CLINICAL DATA:  Shortness of breath, respiratory failure. EXAM: PORTABLE CHEST 1 VIEW COMPARISON:  09/09/2021 and CT chest 09/07/2021. FINDINGS: Trachea is midline. Heart size stable. Right PICC tip is at the SVC RA junction. Mixed interstitial and airspace opacification bilaterally, with slight improvement in aeration in the left perihilar region. Probable bilateral pleural effusions. IMPRESSION: 1. Persistent mixed interstitial and airspace opacification with slight improvement in aeration in the left perihilar region, findings likely due to pneumonia. 2. Probable  bilateral pleural effusions. Electronically Signed   By: Lorin Picket M.D.   On: 09/11/2021 08:34   DG CHEST PORT 1 VIEW  Result Date: 09/09/2021 CLINICAL DATA:  Respiratory complication. EXAM: PORTABLE CHEST 1 VIEW COMPARISON:  September 08, 2021. FINDINGS: Stable cardiomegaly. Stable bilateral lung opacities are noted, left greater than right, consistent with pneumonia and possible edema is well. Interval placement of right-sided PICC line with distal tip in expected position of the SVC. Bony thorax is unremarkable. IMPRESSION: Stable bilateral lung opacities as described above. Interval placement of right-sided PICC line. Electronically Signed   By: Marijo Conception M.D.   On: 09/09/2021 08:11   DG CHEST PORT 1 VIEW  Result Date: 09/08/2021 CLINICAL DATA:  Shortness of breath. EXAM: PORTABLE CHEST 1 VIEW COMPARISON:  09/07/2021. FINDINGS: The heart is mildly enlarged and the mediastinal contours are within normal limits. Interstitial and airspace opacities are noted in the lungs bilaterally, increased from the prior exam. No effusion or pneumothorax. No acute osseous abnormality IMPRESSION: Interstitial and airspace opacities in the lungs bilaterally, increased from the prior exam. Electronically Signed   By: Brett Fairy M.D.   On: 09/08/2021 01:59   DG Chest Portable 1 View  Result Date: 09/07/2021 CLINICAL DATA:  Productive cough.  Chest pain and abdominal pain. EXAM: PORTABLE CHEST 1 VIEW COMPARISON:  12/24/2020 FINDINGS: Stable cardiomediastinal contours. Similar to study from 06/29/2020 there are recurrent, diffuse reticular and nodular opacities identified throughout both lungs. No focal lobar consolidation identified. No signs of pleural effusion or interstitial edema. IMPRESSION: Diffuse bilateral reticulonodular pulmonary opacities are noted bilaterally. Imaging findings are worrisome for multifocal pneumonia including atypical viral pneumonia. Follow-up imaging is advised to ensure  resolution. If this does not resolve then further evaluation with CT of the chest is recommended to assess for underlying malignancy. Electronically Signed   By: Kerby Moors M.D.   On: 09/07/2021 10:56   ECHOCARDIOGRAM COMPLETE  Result Date: 09/08/2021    ECHOCARDIOGRAM REPORT   Patient Name:   HARRINGTON JOBE Date of Exam: 09/08/2021 Medical Rec #:  [id number]        Height:       70.0 in Accession #:    [id number]       Weight:       249.0 lb  Date of Birth:  10-Jun-1956        BSA:          2.291 m Patient Age:    22 years         BP:           123/73 mmHg Patient Gender: M                HR:           75 bpm. Exam Location:  Inpatient Procedure: 2D Echo, Cardiac Doppler, Color Doppler and Intracardiac            Opacification Agent Indications:    NSTEMI  History:        Patient has prior history of Echocardiogram examinations, most                 recent 12/25/2020. CAD and Previous Myocardial Infarction; Risk                 Factors:Hypertension and Dyslipidemia.  Sonographer:    Clayton Lefort RDCS (AE) Referring Phys: [id number] Shela Leff  Sonographer Comments: On BiPap. IMPRESSIONS  1. Mild global hypokinesis with more severe hypokinesis in the inferolateral, anterolateral, and apical myocardium. Left ventricular ejection fraction, by estimation, is 30 to 35%. The left ventricle has moderately decreased function. The left ventricle  demonstrates regional wall motion abnormalities (see scoring diagram/findings for description). Left ventricular diastolic parameters are consistent with Grade II diastolic dysfunction (pseudonormalization).  2. Right ventricular systolic function is mildly reduced. The right ventricular size is normal. There is normal pulmonary artery systolic pressure.  3. The mitral valve is normal in structure. Trivial mitral valve regurgitation. No evidence of mitral stenosis.  4. The aortic valve is tricuspid. Aortic valve regurgitation is not visualized. No aortic stenosis is present.   5. The inferior vena cava is dilated in size with >50% respiratory variability, suggesting right atrial pressure of 8 mmHg. FINDINGS  Left Ventricle: Mild global hypokinesis with more severe hypokinesis in the inferolateral, anterolateral, and apical myocardium. Left ventricular ejection fraction, by estimation, is 30 to 35%. The left ventricle has moderately decreased function. The left ventricle demonstrates regional wall motion abnormalities. The left ventricular internal cavity size was normal in size. There is no left ventricular hypertrophy. Left ventricular diastolic parameters are consistent with Grade II diastolic dysfunction (pseudonormalization). Right Ventricle: The right ventricular size is normal. No increase in right ventricular wall thickness. Right ventricular systolic function is mildly reduced. There is normal pulmonary artery systolic pressure. The tricuspid regurgitant velocity is 2.28 m/s, and with an assumed right atrial pressure of 8 mmHg, the estimated right ventricular systolic pressure is 07.6 mmHg. Left Atrium: Left atrial size was normal in size. Right Atrium: Right atrial size was normal in size. Pericardium: There is no evidence of pericardial effusion. Mitral Valve: The mitral valve is normal in structure. Trivial mitral valve regurgitation. No evidence of mitral valve stenosis. MV peak gradient, 5.1 mmHg. The mean mitral valve gradient is 2.0 mmHg. Tricuspid Valve: The tricuspid valve is normal in structure. Tricuspid valve regurgitation is trivial. No evidence of tricuspid stenosis. Aortic Valve: The aortic valve is tricuspid. Aortic valve regurgitation is not visualized. No aortic stenosis is present. Aortic valve mean gradient measures 3.0 mmHg. Aortic valve peak gradient measures 4.7 mmHg. Aortic valve area, by VTI measures 2.07 cm. Pulmonic Valve: The pulmonic valve was normal in structure. Pulmonic valve regurgitation is trivial. No evidence of pulmonic stenosis. Aorta: The  aortic root  is normal in size and structure. Venous: The inferior vena cava is dilated in size with greater than 50% respiratory variability, suggesting right atrial pressure of 8 mmHg. IAS/Shunts: No atrial level shunt detected by color flow Doppler.  LEFT VENTRICLE PLAX 2D LVIDd:         5.40 cm   Diastology LVIDs:         4.40 cm   LV e' medial:    4.13 cm/s LV PW:         1.00 cm   LV E/e' medial:  27.8 LV IVS:        0.90 cm   LV e' lateral:   8.85 cm/s LVOT diam:     2.00 cm   LV E/e' lateral: 13.0 LV SV:         48 LV SV Index:   21 LVOT Area:     3.14 cm  RIGHT VENTRICLE            IVC RV Basal diam:  3.90 cm    IVC diam: 2.10 cm RV Mid diam:    3.70 cm RV S prime:     9.15 cm/s TAPSE (M-mode): 1.8 cm LEFT ATRIUM             Index        RIGHT ATRIUM           Index LA diam:        3.80 cm 1.66 cm/m   RA Area:     17.00 cm LA Vol (A2C):   57.4 ml 25.05 ml/m  RA Volume:   47.10 ml  20.56 ml/m LA Vol (A4C):   53.0 ml 23.13 ml/m LA Biplane Vol: 58.6 ml 25.58 ml/m  AORTIC VALVE AV Area (Vmax):    2.16 cm AV Area (Vmean):   2.00 cm AV Area (VTI):     2.07 cm AV Vmax:           108.00 cm/s AV Vmean:          74.100 cm/s AV VTI:            0.231 m AV Peak Grad:      4.7 mmHg AV Mean Grad:      3.0 mmHg LVOT Vmax:         74.30 cm/s LVOT Vmean:        47.200 cm/s LVOT VTI:          0.152 m LVOT/AV VTI ratio: 0.66  AORTA Ao Root diam: 3.00 cm Ao Asc diam:  2.90 cm MITRAL VALVE                TRICUSPID VALVE MV Area (PHT): 4.33 cm     TR Peak grad:   20.8 mmHg MV Area VTI:   1.52 cm     TR Vmax:        228.00 cm/s MV Peak grad:  5.1 mmHg MV Mean grad:  2.0 mmHg     SHUNTS MV Vmax:       1.13 m/s     Systemic VTI:  0.15 m MV Vmean:      59.5 cm/s    Systemic Diam: 2.00 cm MV Decel Time: 175 msec MV E velocity: 115.00 cm/s MV A velocity: 66.00 cm/s MV E/A ratio:  1.74 Skeet Latch MD Electronically signed by Skeet Latch MD Signature Date/Time: 09/08/2021/5:55:25 PM    Final    Intravitreal  Injection, Pharmacologic Agent - OD - Right Eye  Result Date: 08/27/2021 Time Out 08/27/2021. 9:58 AM. Confirmed correct patient, procedure, site, and patient consented. Anesthesia Topical anesthesia was used. Anesthetic medications included Lidocaine 4%. Procedure Preparation included Tobramycin 0.3%, 10% betadine to eyelids, 5% betadine to ocular surface. A 30 gauge needle was used. Injection: 2.5 mg bevacizumab 2.5 MG/0.1ML   Route: Intravitreal, Site: Right Eye   NDC: [phone], Lot: [id number] Post-op Post injection exam found visual acuity of at least counting fingers. The patient tolerated the procedure well. There were no complications. The patient received written and verbal post procedure care education. Post injection medications included ocuflox.   OCT, Retina - OU - Both Eyes  Result Date: 08/27/2021 Right Eye Quality was good. Scan locations included subfoveal. Central Foveal Thickness: 303. Progression has been stable. Findings include cystoid macular edema. Notes Chronic CSME, stable overall with no increase in CSME, center involved in the past, will repeat injection Avastin today at nearly 5-week interval to maintain  [place] EKG SITE RITE  Result Date: 09/08/2021 If Site Rite image not attached, placement could not be confirmed due to current cardiac rhythm.      Subjective: Patient is feeling better, hid dyspnea and lower extremities continue to improve, no nausea or vomiting,   Discharge Exam: Vitals:   09/16/21 0500 09/16/21 0810  BP: 133/65 (!) 143/65  Pulse: 66 65  Resp: 17 16  Temp: 97.6 F (36.4 C) (!) 97.5 F (36.4 C)  SpO2: 96% 98%   Vitals:   09/15/21 2137 09/15/21 2342 09/16/21 0500 09/16/21 0810  BP: 134/68 116/61 133/65 (!) 143/65  Pulse: 71 69 66 65  Resp: _0 Temp: 97.7 F (36.5 C) 98.1 F (36.7 C) 97.6 F (36.4 C) (!) 97.5 F (36.4 C)  TempSrc: Oral Oral Oral Oral  SpO2: 97% 96% 96% 98%  Weight:   103.9 kg   Height:        General:  Not in pain or dyspnea Neurology: Awake and alert, non focal  E ENT: no pallor, no icterus, oral mucosa moist Cardiovascular: No JVD. S1-S2 present, rhythmic, no gallops, positive systolic murm at the right sternal border.  Trace lower extremity edema. Unna boots in place on bilateral lower extremities.  Pulmonary: positive breath sounds bilaterally, with no wheezing, or rhonchi, very faint rales at bases. Gastrointestinal. Abdomen soft and non tender Skin. No rashes Musculoskeletal: no joint deformities   The results of significant diagnostics from this hospitalization (including imaging, microbiology, ancillary and laboratory) are listed below for reference.     Microbiology: Recent Results (from the past 240 hour(s))  Resp Panel by RT-PCR (Flu A&B, Covid) Nasopharyngeal Swab     Status: None   Collection Time: 09/07/21 10:44 AM   Specimen: Nasopharyngeal Swab; Nasopharyngeal(NP) swabs in vial transport medium  Result Value Ref Range Status   SARS Coronavirus 2 by RT PCR NEGATIVE NEGATIVE Final    Comment: (NOTE) SARS-CoV-2 target nucleic acids are NOT DETECTED.  The SARS-CoV-2 RNA is generally detectable in upper respiratory specimens during the acute phase of infection. The lowest concentration of SARS-CoV-2 viral copies this assay can detect is 138 copies/mL. A negative result does not preclude SARS-Cov-2 infection and should not be used as the sole basis for treatment or other patient management decisions. A negative result may occur with  improper specimen collection/handling, submission of specimen other than nasopharyngeal swab, presence of viral mutation(s) within the areas targeted by this assay, and inadequate number of viral copies(<138 copies/mL). A negative result must be  combined with clinical observations, patient history, and epidemiological information. The expected result is Negative.  Fact Sheet for Patients:   EntrepreneurPulse.com.au  Fact Sheet for Healthcare Providers:  IncredibleEmployment.be  This test is no t yet approved or cleared by the [place] FDA and  has been authorized for detection and/or diagnosis of SARS-CoV-2 by FDA under an Emergency Use Authorization (EUA). This EUA will remain  in effect (meaning this test can be used) for the duration of the COVID-19 declaration under Section 564(b)(1) of the Act, 21 U.S.C.section 360bbb-3(b)(1), unless the authorization is terminated  or revoked sooner.       Influenza A by PCR NEGATIVE NEGATIVE Final   Influenza B by PCR NEGATIVE NEGATIVE Final    Comment: (NOTE) The Xpert Xpress SARS-CoV-2/FLU/RSV plus assay is intended as an aid in the diagnosis of influenza from Nasopharyngeal swab specimens and should not be used as a sole basis for treatment. Nasal washings and aspirates are unacceptable for Xpert Xpress SARS-CoV-2/FLU/RSV testing.  Fact Sheet for Patients: EntrepreneurPulse.com.au  Fact Sheet for Healthcare Providers: IncredibleEmployment.be  This test is not yet approved or cleared by the [place] FDA and has been authorized for detection and/or diagnosis of SARS-CoV-2 by FDA under an Emergency Use Authorization (EUA). This EUA will remain in effect (meaning this test can be used) for the duration of the COVID-19 declaration under Section 564(b)(1) of the Act, 21 U.S.C. section 360bbb-3(b)(1), unless the authorization is terminated or revoked.  Performed at [facility], [place] [address], [place], [place] [address]   Expectorated Sputum Assessment w Gram Stain, Rflx to Resp Cult     Status: None   Collection Time: 09/08/21 11:56 AM   Specimen: Expectorated Sputum  Result Value Ref Range Status   Specimen Description EXPECTORATED SPUTUM  Final   Special Requests NONE  Final   Sputum evaluation   Final    THIS SPECIMEN IS  ACCEPTABLE FOR SPUTUM CULTURE Performed at [facility], [place] [address] [place], [place] [address]    Report Status 09/08/2021 FINAL  Final  Culture, Respiratory w Gram Stain     Status: None   Collection Time: 09/08/21 11:56 AM  Result Value Ref Range Status   Specimen Description EXPECTORATED SPUTUM  Final   Special Requests NONE Reflexed from [id number]  Final   Gram Stain   Final    MODERATE WBC PRESENT,BOTH PMN AND MONONUCLEAR FEW SQUAMOUS EPITHELIAL CELLS PRESENT FEW GRAM POSITIVE RODS RARE GRAM POSITIVE COCCI    Culture   Final    Normal respiratory flora-no Staph aureus or Pseudomonas seen Performed at [facility], [place] [address] [place], [place] [address]    Report Status 09/10/2021 FINAL  Final     Labs: BNP (last 3 results) Recent Labs    09/08/21 1403 09/09/21 0405 09/11/21 0516  BNP 1,081.8* 618.3* 469.6*   Basic Metabolic Panel: Recent Labs  Lab 09/10/21 0346 09/11/21 0516 09/12/21 0250 09/12/21 1516 09/13/21 0420 09/14/21 0315 09/15/21 0237 09/16/21 0536  NA 138   < > 138 137 136 135 133* 135  K 3.8   < > 3.6 3.9 3.8 3.7 3.8 3.7  CL 99   < > 92* 94* 92* 90* 88* 88*  CO2 28   < > 33* 33* 34* 36* 37* 35*  GLUCOSE 102*   < > 130* 209* 195* 222* 266* 185*  BUN 63*   < > 74* 71* 74* 76* 76* 78*  CREATININE 3.32*   < >  3.15* 2.88* 2.84* 2.88* 2.73* 2.85*  CALCIUM 7.7*   < > 8.3* 8.2* 8.3* 8.5* 8.5* 8.9  MG 2.6*  --  2.5*  --  2.2 2.0 2.2  --    < > = values in this interval not displayed.   Liver Function Tests: No results for input(s): AST, ALT, ALKPHOS, BILITOT, PROT, ALBUMIN in the last 168 hours. No results for input(s): LIPASE, AMYLASE in the last 168 hours. No results for input(s): AMMONIA in the last 168 hours. CBC: Recent Labs  Lab 09/10/21 0346 09/10/21 2012 09/11/21 0516 09/12/21 0250 09/13/21 0420 09/14/21 0315 09/15/21 0237 09/16/21 0536  WBC 9.2  --  7.2 7.0 7.3 9.6  --   --   HGB 8.3*   < > 7.6* 7.4* 7.5* 7.9* 7.6*  8.2*  HCT 25.3*   < > 23.3* 23.3* 23.6* 24.1* 23.8* 25.4*  MCV 86.3  --  86.6 86.9 86.8 86.1  --   --   PLT 157  --  161 176 194 208  --   --    < > = values in this interval not displayed.   Cardiac Enzymes: No results for input(s): CKTOTAL, CKMB, CKMBINDEX, TROPONINI in the last 168 hours. BNP: Invalid input(s): POCBNP CBG: Recent Labs  Lab 09/15/21 0642 09/15/21 1113 09/15/21 1627 09/15/21 2132 09/16/21 0606  GLUCAP 195* 186* 212* 224* 172*   D-Dimer No results for input(s): DDIMER in the last 72 hours. Hgb A1c No results for input(s): HGBA1C in the last 72 hours. Lipid Profile No results for input(s): CHOL, HDL, LDLCALC, TRIG, CHOLHDL, LDLDIRECT in the last 72 hours. Thyroid function studies No results for input(s): TSH, T4TOTAL, T3FREE, THYROIDAB in the last 72 hours.  Invalid input(s): FREET3 Anemia work up Recent Labs    09/14/21 0315  FERRITIN 151  TIBC 182*  IRON 35*   Urinalysis    Component Value Date/Time   COLORURINE YELLOW 09/07/2021 [place] 09/07/2021 1034   LABSPEC 1.014 09/07/2021 1034   PHURINE 6.0 09/07/2021 1034   GLUCOSEU 50 (A) 09/07/2021 1034   HGBUR SMALL (A) 09/07/2021 1034   BILIRUBINUR NEGATIVE 09/07/2021 1034   BILIRUBINUR neg 12/13/2014 1447   KETONESUR NEGATIVE 09/07/2021 1034   PROTEINUR >=300 (A) 09/07/2021 1034   UROBILINOGEN 1.0 12/13/2014 1447   UROBILINOGEN 1.0 05/05/2014 0630   NITRITE NEGATIVE 09/07/2021 1034   LEUKOCYTESUR NEGATIVE 09/07/2021 1034   Sepsis Labs Invalid input(s): PROCALCITONIN,  WBC,  LACTICIDVEN Microbiology Recent Results (from the past 240 hour(s))  Resp Panel by RT-PCR (Flu A&B, Covid) Nasopharyngeal Swab     Status: None   Collection Time: 09/07/21 10:44 AM   Specimen: Nasopharyngeal Swab; Nasopharyngeal(NP) swabs in vial transport medium  Result Value Ref Range Status   SARS Coronavirus 2 by RT PCR NEGATIVE NEGATIVE Final    Comment: (NOTE) SARS-CoV-2 target nucleic acids  are NOT DETECTED.  The SARS-CoV-2 RNA is generally detectable in upper respiratory specimens during the acute phase of infection. The lowest concentration of SARS-CoV-2 viral copies this assay can detect is 138 copies/mL. A negative result does not preclude SARS-Cov-2 infection and should not be used as the sole basis for treatment or other patient management decisions. A negative result may occur with  improper specimen collection/handling, submission of specimen other than nasopharyngeal swab, presence of viral mutation(s) within the areas targeted by this assay, and inadequate number of viral copies(<138 copies/mL). A negative result must be combined with clinical observations, patient history, and epidemiological  information. The expected result is Negative.  Fact Sheet for Patients:  EntrepreneurPulse.com.au  Fact Sheet for Healthcare Providers:  IncredibleEmployment.be  This test is no t yet approved or cleared by the [place] FDA and  has been authorized for detection and/or diagnosis of SARS-CoV-2 by FDA under an Emergency Use Authorization (EUA). This EUA will remain  in effect (meaning this test can be used) for the duration of the COVID-19 declaration under Section 564(b)(1) of the Act, 21 U.S.C.section 360bbb-3(b)(1), unless the authorization is terminated  or revoked sooner.       Influenza A by PCR NEGATIVE NEGATIVE Final   Influenza B by PCR NEGATIVE NEGATIVE Final    Comment: (NOTE) The Xpert Xpress SARS-CoV-2/FLU/RSV plus assay is intended as an aid in the diagnosis of influenza from Nasopharyngeal swab specimens and should not be used as a sole basis for treatment. Nasal washings and aspirates are unacceptable for Xpert Xpress SARS-CoV-2/FLU/RSV testing.  Fact Sheet for Patients: EntrepreneurPulse.com.au  Fact Sheet for Healthcare Providers: IncredibleEmployment.be  This test is  not yet approved or cleared by the [place] FDA and has been authorized for detection and/or diagnosis of SARS-CoV-2 by FDA under an Emergency Use Authorization (EUA). This EUA will remain in effect (meaning this test can be used) for the duration of the COVID-19 declaration under Section 564(b)(1) of the Act, 21 U.S.C. section 360bbb-3(b)(1), unless the authorization is terminated or revoked.  Performed at [facility], [place] [address] [place], [place] [address]   Expectorated Sputum Assessment w Gram Stain, Rflx to Resp Cult     Status: None   Collection Time: 09/08/21 11:56 AM   Specimen: Expectorated Sputum  Result Value Ref Range Status   Specimen Description EXPECTORATED SPUTUM  Final   Special Requests NONE  Final   Sputum evaluation   Final    THIS SPECIMEN IS ACCEPTABLE FOR SPUTUM CULTURE Performed at [facility], [place] [address], [place],  [address]    Report Status 09/08/2021 FINAL  Final  Culture, Respiratory w Gram Stain     Status: None   Collection Time: 09/08/21 11:56 AM  Result Value Ref Range Status   Specimen Description EXPECTORATED SPUTUM  Final   Special Requests NONE Reflexed from [id number]  Final   Gram Stain   Final    MODERATE WBC PRESENT,BOTH PMN AND MONONUCLEAR FEW SQUAMOUS EPITHELIAL CELLS PRESENT FEW GRAM POSITIVE RODS RARE GRAM POSITIVE COCCI    Culture   Final    Normal respiratory flora-no Staph aureus or Pseudomonas seen Performed at [facility], [place] [address] [place],  [address]    Report Status 09/10/2021 FINAL  Final     Time coordinating discharge: 45 minutes  SIGNED:   Tawni Millers, MD  [facility] 09/16/2021, 11:34 AM

## 2021-09-16 NOTE — Progress Notes (Signed)
SATURATION QUALIFICATIONS: (This note is used to comply with regulatory documentation for home oxygen)  Patient Saturations on Room Air at Rest = 94%  Patient Saturations on Room Air while Ambulating = 85%  Patient Saturations on 2 Liters of oxygen while Ambulating = 90-91%  Please briefly explain why patient needs home oxygen: desaturations while ambulating.

## 2021-09-16 NOTE — Progress Notes (Signed)
Physical Therapy Treatment Patient Details Name: Derek Lowery MRN: [id number] DOB: 01/01/1956 Today's Date: 09/16/2021   History of Present Illness 67 y/o male admitted 1/21 due to multifocal PNA and NSTEMI. Pt with increasing supplemental O2 needs and transferred to ICU 1/22. PMH: CHF, CAD, HTN, DM, HLD, glaucoma.    PT Comments    Patient progressing well towards PT goals. Session focused on stair training and gait training to prepare pt for d/c home. Tolerated stair negotiation with Min guard assist and use of rail for support. Sp02 dropped to 88% on RA with stairs but recovered quickly and otherwise maintained >90% with ambulation. Pt asking questions related to treadmill walking however encouraged walking on flat ground with use of RW and having HHPT assess for safety at a later time/readiness. Plans to d/c home this afternoon. Will follow if still in the hospital.    Recommendations for follow up therapy are one component of a multi-disciplinary discharge planning process, led by the attending physician.  Recommendations may be updated based on patient status, additional functional criteria and insurance authorization.  Follow Up Recommendations  Home health PT     Assistance Recommended at Discharge Intermittent Supervision/Assistance  Patient can return home with the following A little help with walking and/or transfers;A little help with bathing/dressing/bathroom;Assist for transportation;Assistance with cooking/housework   Equipment Recommendations  None recommended by PT    Recommendations for Other Services       Precautions / Restrictions Precautions Precautions: Fall;Other (comment) Precaution Comments: watch 02 Restrictions Weight Bearing Restrictions: No     Mobility  Bed Mobility               General bed mobility comments: Sitting at EOB upon arrival    Transfers Overall transfer level: Needs assistance Equipment used: Rollator (4  wheels) Transfers: Sit to/from Stand Sit to Stand: Min guard           General transfer comment: Min guard for safety, stood from EOB x1, from rollator seat x1    Ambulation/Gait Ambulation/Gait assistance: Min guard Gait Distance (Feet): 200 Feet (x2 bouts) Assistive device: Rollator (4 wheels) Gait Pattern/deviations: Step-through pattern, Trunk flexed, Decreased stride length Gait velocity: decreased Gait velocity interpretation: 1.31 - 2.62 ft/sec, indicative of limited community ambulator   General Gait Details: Slow, mostly steady gait with 1 seated rest break post stairs. Sp02 dropped to 88% during stair training, otherwise >90%. 2/4 DOE.   Stairs Stairs: Yes Stairs assistance: Min guard Stair Management: Step to pattern, One rail Right Number of Stairs: 4 General stair comments: Cues for technique/safety, no knee buckling   Wheelchair Mobility    Modified Rankin (Stroke Patients Only)       Balance Overall balance assessment: Needs assistance Sitting-balance support: Feet supported, No upper extremity supported Sitting balance-Leahy Scale: Good     Standing balance support: During functional activity, Reliant on assistive device for balance Standing balance-Leahy Scale: Poor                              Cognition Arousal/Alertness: Awake/alert Behavior During Therapy: WFL for tasks assessed/performed Overall Cognitive Status: Within Functional Limits for tasks assessed                                          Exercises      General Comments General  comments (skin integrity, edema, etc.): Sp02 stayed >88% with stairs/activity.      Pertinent Vitals/Pain Pain Assessment Pain Assessment: No/denies pain    Home Living                          Prior Function            PT Goals (current goals can now be found in the care plan section) Progress towards PT goals: Progressing toward goals     Frequency    Min 3X/week      PT Plan Current plan remains appropriate    Co-evaluation              AM-PAC PT "6 Clicks" Mobility   Outcome Measure  Help needed turning from your back to your side while in a flat bed without using bedrails?: A Little Help needed moving from lying on your back to sitting on the side of a flat bed without using bedrails?: A Little Help needed moving to and from a bed to a chair (including a wheelchair)?: A Little Help needed standing up from a chair using your arms (e.g., wheelchair or bedside chair)?: A Little Help needed to walk in hospital room?: A Little Help needed climbing 3-5 steps with a railing? : A Little 6 Click Score: 18    End of Session Equipment Utilized During Treatment: Gait belt Activity Tolerance: Patient tolerated treatment well Patient left: in bed;with call bell/phone within reach Nurse Communication: Mobility status PT Visit Diagnosis: Unsteadiness on feet (R26.81);Muscle weakness (generalized) (M62.81)     Time: 5366-4403 PT Time Calculation (min) (ACUTE ONLY): 23 min  Charges:  $Gait Training: 23-37 mins                     Marisa Severin, PT, DPT Acute Rehabilitation Services Pager [phone] Office [place] 09/16/2021, 3:18 PM

## 2021-09-16 NOTE — Consult Note (Signed)
° °  [facility] Inpatient Consult   09/16/2021  QAMAR AUGHENBAUGH Jan 03, 1956 [id number]  [place] Organization [ACO] Patient: [employer] Medicare  Primary Care Provider:  Ladell Pier, MD, [facility], is listed to provide the Transition of care [TOC] call and follow up appointment.   Patient screened for length of stay hospitalization with noted high risk score for unplanned readmission risk and to assess for potential [place] Management service needs for post hospital transition.  Review of patient's medical record reveals patient is for home today. 1:46 pm  Came by to speak with patient.  Patient was receiving teaching. Will follow up as time permits.  Plan:  Continue to follow progress and disposition to assess for post hospital care management needs. Will make a referral for [facility] RN follow up for complex disease management.  For questions contact:   Natividad Brood, RN BSN [facility] Liaison  [phone] business mobile phone Toll free office [phone]  Fax number: [phone] [email or web address]

## 2021-09-17 ENCOUNTER — Other Ambulatory Visit: Payer: Self-pay | Admitting: Pharmacist

## 2021-09-17 ENCOUNTER — Other Ambulatory Visit: Payer: 59 | Admitting: *Deleted

## 2021-09-17 ENCOUNTER — Telehealth: Payer: Self-pay

## 2021-09-17 DIAGNOSIS — E1142 Type 2 diabetes mellitus with diabetic polyneuropathy: Secondary | ICD-10-CM

## 2021-09-17 DIAGNOSIS — Z794 Long term (current) use of insulin: Secondary | ICD-10-CM

## 2021-09-17 MED ORDER — DEXCOM G6 RECEIVER DEVI: 0 refills | Status: AC

## 2021-09-17 MED ORDER — DEXCOM G6 SENSOR MISC: 3 refills | Status: DC

## 2021-09-17 MED ORDER — DEXCOM G6 TRANSMITTER MISC: 3 refills | Status: DC

## 2021-09-17 NOTE — Patient Outreach (Signed)
Initial telephone outreach for [place] Management, unsuccessful, left message and requested a return call.   Derek Lowery. Myrtie Neither, MSN, [facility] Gerontological Nurse Practitioner [facility] Care Management [phone]

## 2021-09-17 NOTE — Patient Outreach (Signed)
[facility]) Care Management  09/17/2021  Derek Lowery 28-Feb-1956 [id number]   Hospital referral received from Natividad Brood, RN for [place] Coordinator for complex care and disease management follow up calls and assess for further needs. Assigned to [name], RN.    [place] [facility] Management Assistant

## 2021-09-17 NOTE — Telephone Encounter (Signed)
Transition Care Management Follow-up Telephone Call Date of discharge and from where: 09/16/2021, [facility]  How have you been since you were released from the hospital? He said he is tired but feeling good.  Any questions or concerns? Yes - he has unna boots on both legs and wants to know when they will be removed.  Items Reviewed: Did the pt receive and understand the discharge instructions provided? Yes  Medications obtained and verified? Yes - he said he has all medications and did not have any questions about his med regime.  He administers his own insulin and said that he uses reading glasses and is able to read his med bottles to make sure he is taking the meds as ordered.  Other? No  Any new allergies since your discharge? No  Dietary orders reviewed? Yes  Do you have support at home? Yes  - his sister and his nephew.   Home Care and Equipment/Supplies: Were home health services ordered? yes If so, what is the name of the agency? [facility]  Has the agency set up a time to come to the patient's home? No - this CM called [facility], spoke to Judson Roch who confirmed they have accepted the referral and are waiting for insurance authorization and will then call the patient to schedule a home visit.  Were any new equipment or medical supplies ordered?  Yes: O2 What is the name of the medical supply agency? [employer] Were you able to get the supplies/equipment? yes Do you have any questions related to the use of the equipment or supplies? No  He said that he uses the O2 @ 1L only when laying down. He has an oximeter to monitor O2 sat.  He needs his own glucometer. He has been using his sister's meter.  She checks his blood sugars when he thinks he needs it checked.   Functional Questionnaire: (I = Independent and D = Dependent) ADLs: independent. Has RW and cane to use with ambulation. His sister can help with ADLS if needed.    Follow up appointments reviewed:  PCP Hospital f/u appt  confirmed? Yes  Scheduled to see Dr Wynetta Emery - 09/27/2021.  [facility] f/u appt confirmed? Yes  Scheduled to see [facility]- 09/24/2021, Retina Specialist - 10/08/2021.  Are transportation arrangements needed?  Sometimes he may need transportation.  Informed him that he can call [employer], the number is on the back of his ID card and [facility], they both will provide free transportation to his medical appointments. If necessary, we can schedule [facility] for him. If their condition worsens, is the pt aware to call PCP or go to the Emergency Dept.? Yes Was the patient provided with contact information for the PCP's office or ED? Yes Was to pt encouraged to call back with questions or concerns? Yes

## 2021-09-17 NOTE — Telephone Encounter (Signed)
Call placed to patient to inform him that an order for a Dexcom has been sent to his pharmacy. Message left with call back requested to this CM

## 2021-09-18 ENCOUNTER — Telehealth: Payer: Self-pay | Admitting: Internal Medicine

## 2021-09-18 NOTE — Telephone Encounter (Signed)
Diane from [facility] callled in requesting glucometer for patient

## 2021-09-18 NOTE — Telephone Encounter (Signed)
Home Health Verbal Orders - Caller/Agency:Diane from [place] Requesting :Skilled Nursing Frequency: 1x9

## 2021-09-18 NOTE — Telephone Encounter (Signed)
Returned Diane call from [place] and provide verbal orders

## 2021-09-19 ENCOUNTER — Telehealth: Payer: Self-pay | Admitting: Internal Medicine

## 2021-09-19 ENCOUNTER — Other Ambulatory Visit: Payer: Self-pay | Admitting: *Deleted

## 2021-09-19 NOTE — Telephone Encounter (Unsigned)
Copied from [place] [id number]. Topic: Quick Communication - Home Health Verbal Orders >> Sep 19, 2021  9:48 AM Yvette Rack wrote: Caller/Agency: Diane with [facility] Number: [phone] Requesting OT/PT/Skilled Nursing/Social Work/Speech Therapy: skilled nursing  Frequency: Diane stated patient came home with compression marks on his legs so she will need and order

## 2021-09-19 NOTE — Telephone Encounter (Signed)
Copied from [place] [id number]. Topic: Quick Communication - Home Health Verbal Orders >> Sep 19, 2021  3:20 PM Yvette Rack wrote: Caller/Agency: Felicity Coyer with [facility] Number: [phone] Requesting OT/PT/Skilled Nursing/Social Work/Speech Therapy: OT Frequency: 1 x 1 week  and 2 x 3 weeks

## 2021-09-19 NOTE — Patient Outreach (Signed)
[facility]) Care Management  09/19/2021  Derek Lowery [birth year] [id number]  Second outreach for [place] Management services, unanswered, left message to return call. If no call returned will send a letter.  Eulah Pont. Myrtie Neither, MSN, [facility] Gerontological Nurse Practitioner [facility] Care Management [phone]

## 2021-09-20 ENCOUNTER — Encounter: Payer: Self-pay | Admitting: *Deleted

## 2021-09-20 NOTE — Telephone Encounter (Signed)
Shireen Miller called in stated she is waiting to hear from Dr Wynetta Emery or nurse about verbal orders for patient OT. Please advise

## 2021-09-23 NOTE — Telephone Encounter (Signed)
Meter was sent in on 09/17/21

## 2021-09-23 NOTE — Telephone Encounter (Signed)
Returned call to [employer] and provided verbal orders

## 2021-09-24 ENCOUNTER — Encounter (HOSPITAL_COMMUNITY): Payer: 59

## 2021-09-24 ENCOUNTER — Telehealth (HOSPITAL_COMMUNITY): Payer: Self-pay

## 2021-09-24 NOTE — Telephone Encounter (Signed)
Return call to patient documented in another note today

## 2021-09-24 NOTE — Telephone Encounter (Signed)
Call returned  to patient to inquire if the wraps on his legs were removed. He said that the OT did not show up as scheduled on 09/20/2021, so his sister removed the wraps on 09/21/2021.  He stated that the " swelling has gone down a lot" and he can't see any open areas on his legs and his sister did not tell him that there were any open areas.   He has not heard from his pharmacy about the status of the order for the dexcom/

## 2021-09-24 NOTE — Telephone Encounter (Signed)
Called patient # [phone] to inquire if his unna boots have been removed. Message left with call back requested to this CM. Call also placed to Diane, RN/[facility] message left with call back requested to this CM

## 2021-09-24 NOTE — Telephone Encounter (Signed)
Called to confirm Heart & Vascular Transitions of Care appointment at [place] today 2/7.   Pt requested to reschedule appt as he did not prepare transportation via insurance. Rescheduled for Wednesday 2/15 @ 10AM per pt request. Pt states he is taking medications as prescribed. Has transportation arranged for [facility] appt 2/10 and plans to attend. Eye appt 2/13.   Confirmed appointment prior to ending call and plan for pt to call to set up transportation.   Pricilla Holm, MSN, RN Heart Failure Nurse Navigator [phone]

## 2021-09-24 NOTE — Telephone Encounter (Signed)
Pt called back and requested to speak directly with Derek Lowery, please advise.

## 2021-09-27 ENCOUNTER — Ambulatory Visit: Payer: 59 | Admitting: Internal Medicine

## 2021-09-27 ENCOUNTER — Encounter: Payer: Self-pay | Admitting: Internal Medicine

## 2021-10-02 ENCOUNTER — Encounter (HOSPITAL_COMMUNITY): Payer: 59

## 2021-10-08 ENCOUNTER — Encounter (INDEPENDENT_AMBULATORY_CARE_PROVIDER_SITE_OTHER): Payer: 59 | Admitting: Ophthalmology

## 2021-10-11 ENCOUNTER — Other Ambulatory Visit: Payer: Self-pay | Admitting: *Deleted

## 2021-10-11 NOTE — Patient Outreach (Signed)
Derek Lowery [facility]) Care Management Geriatric Nurse Practitioner Note   10/11/2021 Name:  Derek Lowery Derek Lowery:  [id number] Derek Lowery:  [birth year]  Summary: New patient post hospitalization for HF. Other medical hx includes CAD, MI, HTN, DM, CKD IV, GERD   Recommendations/Changes made from today's visit: Pt agrees to participate in Derek Lowery. Needs cardiology appt ASAP (missed due to transportation issues). Follow HF Action Plan  Subjective: Derek Lowery is an 66 y.o. year old male who is a primary patient of Derek Pier, Derek Lowery. The care management team was consulted for assistance with care management and/or care coordination needs.    Geriatric Nurse Practitioner completed Telephone Visit today.  Objective: Today's wt: 226# (note admission wt was 265# and discharge 229#, -36#)                    FBS: 103 (uses dexcom 6)                    Chart review: Lipid panel normal except for low HDL 33  Allergies as of 10/11/2021       Reactions   Coreg [carvedilol] Other (See Comments)   Redness and swelling of calf   Empagliflozin Other (See Comments)   Four gangrene        Medication List        Accurate as of October 11, 2021 12:43 PM. If you have any questions, ask your nurse or doctor.          acetaminophen 325 MG tablet Commonly known as: TYLENOL Take 2 tablets (650 mg total) by mouth every 6 (six) hours as needed for mild pain (or Fever >/= 101).   ammonium lactate 12 % lotion Commonly known as: AmLactin Apply to both feet twice daily for dry skin. What changed:  how much to take when to take this reasons to take this   aspirin EC 81 MG tablet Take 1 tablet (81 mg total) by mouth daily.   atorvastatin 40 MG tablet Commonly known as: LIPITOR TAKE 1 TABLET BY MOUTH EVERY DAY What changed: additional instructions   BD Pen Needle Nano 2nd Gen 32G X 4 MM Misc Generic drug: Insulin Pen Needle USE AS DIRECTED 3 TIMES A DAY.  PLEASE SCHEDULE AN APPOINTMENT FOR ADDITIONAL REFILLS.   blood glucose meter kit and supplies Kit Dispense based on patient and insurance preference. Use up to four times daily as directed. One Touch Verio   brimonidine 0.2 % ophthalmic solution Commonly known as: ALPHAGAN Place 1 drop into the right eye 2 (two) times daily.   carvedilol 12.5 MG tablet Commonly known as: COREG Take 1 tablet (12.5 mg total) by mouth 2 (two) times daily with a meal.   Dexcom G6 Receiver Devi Use to check blood sugar four times daily. E11.42   Dexcom G6 Sensor Misc Use to check blood sugar four times daily. E11.42   Dexcom G6 Transmitter Misc Use to check blood sugar four times daily. E11.42   dorzolamide 2 % ophthalmic solution Commonly known as: TRUSOPT Place 1 drop into the right eye 2 (two) times daily.   glucose monitoring kit monitoring kit 1 each by Does not apply route as needed for other.   hydrALAZINE 100 MG tablet Commonly known as: APRESOLINE Take 1 tablet (100 mg total) by mouth 3 (three) times daily.   isosorbide mononitrate 60 MG 24 hr tablet Commonly known as: IMDUR Take 1 tablet (60 mg total)  by mouth daily.   Lancets Misc Use as directed.  Accu chek 2   Lantus SoloStar 100 UNIT/ML Solostar Pen Generic drug: insulin glargine Inject 10 Units into the skin at bedtime.   latanoprost 0.005 % ophthalmic solution Commonly known as: XALATAN Place 1 drop into the right eye at bedtime.   multivitamin with minerals tablet Take 1 tablet by mouth daily.   pantoprazole 40 MG tablet Commonly known as: PROTONIX Take 1 tablet (40 mg total) by mouth daily for 14 days.   Rhopressa 0.02 % Soln Generic drug: Netarsudil Dimesylate Place 1 drop into the right eye at bedtime.   timolol 0.5 % ophthalmic solution Commonly known as: TIMOPTIC Place 1 drop into the right eye 2 (two) times daily.   Torsemide 40 MG Tabs Take 40 mg by mouth 2 (two) times daily.       SDOH:  (Social  Determinants of Health) assessments and interventions performed:  SDOH Interventions    Flowsheet Row Most Recent Value  SDOH Interventions   Food Insecurity Interventions Intervention Not Indicated  Transportation Interventions Other (Comment)  [place] and has benefits but has had difficulty with communicating and getting the transportation.]      Care Plan  Review of patient past medical history, allergies, medications, health status, including review of consultants reports, laboratory and other test data, was performed as part of comprehensive evaluation for care management services.   Discussed goals that would be appropriate for pt. We will focus on his HF and following the HF Action plan. We will also keep in mind his other co-morbidities.  NP to contact [facility] to report difficulties communicating with transportation services, missing appts due to this and pt's great frustration.  We will complete the initial assessment next week and finalize his plan of care.  Derek Lowery. Derek Neither, MSN, [facility] Gerontological Nurse Practitioner [facility] Care Management [phone]

## 2021-10-14 ENCOUNTER — Other Ambulatory Visit: Payer: Self-pay

## 2021-10-14 ENCOUNTER — Ambulatory Visit (INDEPENDENT_AMBULATORY_CARE_PROVIDER_SITE_OTHER): Payer: 59 | Admitting: Ophthalmology

## 2021-10-14 ENCOUNTER — Encounter (INDEPENDENT_AMBULATORY_CARE_PROVIDER_SITE_OTHER): Payer: Self-pay | Admitting: Ophthalmology

## 2021-10-14 DIAGNOSIS — H211X1 Other vascular disorders of iris and ciliary body, right eye: Secondary | ICD-10-CM | POA: Diagnosis not present

## 2021-10-14 DIAGNOSIS — H4051X3 Glaucoma secondary to other eye disorders, right eye, severe stage: Secondary | ICD-10-CM | POA: Diagnosis not present

## 2021-10-14 DIAGNOSIS — E113511 Type 2 diabetes mellitus with proliferative diabetic retinopathy with macular edema, right eye: Secondary | ICD-10-CM | POA: Diagnosis not present

## 2021-10-14 MED ORDER — BEVACIZUMAB 2.5 MG/0.1ML IZ SOSY
2.5000 mg | PREFILLED_SYRINGE | INTRAVITREAL | Status: AC | PRN
  Administered 2021-10-22: 2.5 mg via INTRAVITREAL

## 2021-10-14 NOTE — Progress Notes (Signed)
10/14/2021     CHIEF COMPLAINT Patient presents for  Chief Complaint  Patient presents with   Diabetic Retinopathy with Macular Edema      HISTORY OF PRESENT ILLNESS: Derek Lowery is a 66 y.o. male who presents to the clinic today for:   HPI   7 weeks OD Avastin OCT, no dilate. Pt states "my vision seems a little more blurred in my right eye, I think because I missed a week. I can't see anything out of my left eye." Pt is using latanoprost OD QHS, Rhopressa OD QHS, Brimonidine OD BID, Dorzolamide OD BID, Timolol OD BID. Last edited by Laurin Coder on 10/14/2021  9:44 AM.      Referring physician: Ladell Pier, MD [place],  [place] [address]  HISTORICAL INFORMATION:   Selected notes from the MEDICAL RECORD NUMBER    Lab Results  Component Value Date   HGBA1C 7.1 (H) 09/08/2021     CURRENT MEDICATIONS: Current Outpatient Medications (Ophthalmic Drugs)  Medication Sig   brimonidine (ALPHAGAN) 0.2 % ophthalmic solution Place 1 drop into the right eye 2 (two) times daily.    dorzolamide (TRUSOPT) 2 % ophthalmic solution Place 1 drop into the right eye 2 (two) times daily.   latanoprost (XALATAN) 0.005 % ophthalmic solution Place 1 drop into the right eye at bedtime.   RHOPRESSA 0.02 % SOLN Place 1 drop into the right eye at bedtime.   timolol (TIMOPTIC) 0.5 % ophthalmic solution Place 1 drop into the right eye 2 (two) times daily.   No current facility-administered medications for this visit. (Ophthalmic Drugs)   Current Outpatient Medications (Other)  Medication Sig   acetaminophen (TYLENOL) 325 MG tablet Take 2 tablets (650 mg total) by mouth every 6 (six) hours as needed for mild pain (or Fever >/= 101).   ammonium lactate (AMLACTIN) 12 % lotion Apply to both feet twice daily for dry skin. (Patient taking differently: 1 application as needed for dry skin. Apply to both feet twice daily for dry skin.)   aspirin EC 81 MG tablet Take 1  tablet (81 mg total) by mouth daily.   atorvastatin (LIPITOR) 40 MG tablet TAKE 1 TABLET BY MOUTH EVERY DAY   BD PEN NEEDLE NANO 2ND GEN 32G X 4 MM MISC USE AS DIRECTED 3 TIMES A DAY. PLEASE SCHEDULE AN APPOINTMENT FOR ADDITIONAL REFILLS.   blood glucose meter kit and supplies KIT Dispense based on patient and insurance preference. Use up to four times daily as directed. One Touch Verio   carvedilol (COREG) 12.5 MG tablet Take 1 tablet (12.5 mg total) by mouth 2 (two) times daily with a meal.   Continuous Blood Gluc Receiver (DEXCOM G6 RECEIVER) DEVI Use to check blood sugar four times daily. E11.42   Continuous Blood Gluc Sensor (DEXCOM G6 SENSOR) MISC Use to check blood sugar four times daily. E11.42   Continuous Blood Gluc Transmit (DEXCOM G6 TRANSMITTER) MISC Use to check blood sugar four times daily. E11.42   glucose monitoring kit (FREESTYLE) monitoring kit 1 each by Does not apply route as needed for other.   hydrALAZINE (APRESOLINE) 100 MG tablet Take 1 tablet (100 mg total) by mouth 3 (three) times daily.   insulin glargine (LANTUS SOLOSTAR) 100 UNIT/ML Solostar Pen Inject 10 Units into the skin at bedtime.   isosorbide mononitrate (IMDUR) 60 MG 24 hr tablet Take 1 tablet (60 mg total) by mouth daily.   Lancets MISC Use as  directed.  Accu chek 2   Multiple Vitamins-Minerals (MULTIVITAMIN WITH MINERALS) tablet Take 1 tablet by mouth daily.   torsemide 40 MG TABS Take 40 mg by mouth 2 (two) times daily.   No current facility-administered medications for this visit. (Other)      REVIEW OF SYSTEMS: ROS   Negative for: Constitutional, Gastrointestinal, Neurological, Skin, Genitourinary, Musculoskeletal, HENT, Endocrine, Cardiovascular, Eyes, Respiratory, Psychiatric, Allergic/Imm, Heme/Lymph Last edited by Hurman Horn, MD on 10/14/2021 10:25 AM.       ALLERGIES Allergies  Allergen Reactions   Coreg [Carvedilol] Other (See Comments)    Redness and swelling of calf    Empagliflozin Other (See Comments)    Four gangrene    PAST MEDICAL HISTORY Past Medical History:  Diagnosis Date   Arthritis    CAD (coronary artery disease)    NSTEMI 06/2011:  LHC 07/18/11: Proximal diagonal 60%, distal LAD with a diabetic appearance and 60% stenosis, OM2 with an occluded superior branch and an inferior branch with 90%, EF 55% with inferior hypokinesis.  PCI: Promus DES to the OM2 inferior branch.  This vessel provides collaterals to the superior branch which remained occluded.  Echocardiogram 07/18/11: EF 60%, normal wall motion.   Cataract    Essential hypertension, benign    Glaucoma    Hypercholesteremia    Internal hemorrhoids    Noncompliance    Type 2 diabetes mellitus ([place]) 1990   Past Surgical History:  Procedure Laterality Date   CATARACT EXTRACTION Right 2019   Dr. Katy Fitch   CHOLECYSTECTOMY N/A 09/21/2017   Procedure: LAPAROSCOPIC CHOLECYSTECTOMY WITH INTRAOPERATIVE CHOLANGIOGRAM;  Surgeon: Michael Boston, MD;  Location: [facility] ORS;  Service: General;  Laterality: N/A;   COLONOSCOPY  2000   Dr. Collene Mares   EYE SURGERY     IRRIGATION AND DEBRIDEMENT ABSCESS Left 05/05/2014   Procedure: IRRIGATION AND DEBRIDEMENT ABSCESS left buttock;  Surgeon: Michael Boston, MD;  Location: [facility];  Service: General;  Laterality: Left;   LEFT HEART CATHETERIZATION WITH CORONARY ANGIOGRAM N/A 07/18/2011   Procedure: LEFT HEART CATHETERIZATION WITH CORONARY ANGIOGRAM;  Surgeon: Hillary Bow, MD;  [facility] C[facility] LAB;  Service: Cardiovascular;  Laterality: N/A;   PERCUTANEOUS CORONARY STENT INTERVENTION (PCI-S)  07/18/2011   Procedure: PERCUTANEOUS CORONARY STENT INTERVENTION (PCI-S);  Surgeon: Hillary Bow, MD;  Location: [facility] CATH LAB;  Service: Cardiovascular;;    FAMILY HISTORY Family History  Problem Relation Age of Onset   Coronary artery disease Father        Developed in his 14s   Kidney disease Father    Diabetes Father    Melanoma Father    Rectal cancer Father     Heart failure Mother    Hypertension Mother    Diabetes Sister    Diabetes Brother    Colon cancer Neg Hx    Esophageal cancer Neg Hx    Stomach cancer Neg Hx     SOCIAL HISTORY Social History   Tobacco Use   Smoking status: Never   Smokeless tobacco: Never  Vaping Use   Vaping Use: Never used  Substance Use Topics   Alcohol use: No   Drug use: No         OPHTHALMIC EXAM:  Base Eye Exam     Visual Acuity (ETDRS)       Right Left   Dist [place] 20/40 NLP   Dist ph [place] NI          Tonometry (Tonopen, 9:46 AM)  Right Left   Pressure 16 24         Pupils       Dark Light Shape APD   Right   Irregular None   Left 3 3 Round +1         Neuro/Psych     Oriented x3: Yes   Mood/Affect: Normal         Dilation     Both eyes: No dilation per Dr [name] note @ 9:45 AM           Slit Lamp and Fundus Exam     External Exam       Right Left   External Normal Normal         Slit Lamp Exam       Right Left   Lids/Lashes Normal Normal   Conjunctiva/Sclera White and quiet, no exposure of tube,cystic conj covers tube well White and quiet   Cornea Clear Clear   Anterior Chamber ,, Tube 12 meridian Deep   Iris NO new NVI large trunks, no signs of new  NVI, updrawn pupil, 3.5 mm, undilated Old neovascularization of the iris 360 degrees, stable nonprogressive   Lens Posterior chamber intraocular lens 3.5+ Nuclear sclerosis   Anterior Vitreous Normal             IMAGING AND PROCEDURES  Imaging and Procedures for 10/22/21  Intravitreal Injection, Pharmacologic Agent - OD - Right Eye       Time Out 10/14/2021. 10:27 AM. Confirmed correct patient, procedure, site, and patient consented.   Anesthesia Topical anesthesia was used. Anesthetic medications included Lidocaine 4%.   Procedure Preparation included Tobramycin 0.3%, 10% betadine to eyelids, 5% betadine to ocular surface. A 30 gauge needle was used.   Injection: 2.5 mg  bevacizumab 2.5 MG/0.1ML   Route: Intravitreal, Site: Right Eye   NDC: [phone], Lot: [id number]   Post-op Post injection exam found visual acuity of at least counting fingers. The patient tolerated the procedure well. There were no complications. The patient received written and verbal post procedure care education. Post injection medications included ocuflox.      OCT, Retina - OU - Both Eyes       Right Eye Quality was good. Scan locations included subfoveal. Central Foveal Thickness: 303. Progression has been stable. Findings include cystoid macular edema.   Notes Chronic CSME, stable overall with no increase in CSME, center involved in the past, will repeat injection Avastin today at nearly 5-6-week interval to maintain             ASSESSMENT/PLAN:  Proliferative diabetic retinopathy of right eye with macular edema associated with type 2 diabetes mellitus (HCC) OD, chronic active CSME stabilized on intravitreal Avastin.  Also preventing neovascular glaucoma progression, repeat intravitreal Avastin OD today  Neovascular glaucoma, right eye, severe stage Under eye examination on this date, no progression of large trunks of NVI  Rubeosis iridis of right eye Stable at 6 weeks 6-day follow-up, do not extend beyond     ICD-10-CM   1. Proliferative diabetic retinopathy of right eye with macular edema associated with type 2 diabetes mellitus (HCC)  E11.3511 Intravitreal Injection, Pharmacologic Agent - OD - Right Eye    OCT, Retina - OU - Both Eyes    bevacizumab (AVASTIN) SOSY 2.5 mg    2. Neovascular glaucoma, right eye, severe stage  H40.51X3     3. Rubeosis iridis of right eye  H21.1X1       1.  Repeat  intravitreal Avastin OD today follow-up next in 6 weeks  2.  3.  Ophthalmic Meds Ordered this visit:  Meds ordered this encounter  Medications   bevacizumab (AVASTIN) SOSY 2.5 mg       Return in about 6 weeks (around 11/25/2021) for OD, OCT, dilate, AVASTIN  OCT.  There are no Patient Instructions on file for this visit.   Explained the diagnoses, plan, and follow up with the patient and they expressed understanding.  Patient expressed understanding of the importance of proper follow up care.   Clent Demark Rankin M.D. Diseases & Surgery of the Retina and Vitreous Retina & Diabetic [place] 10/22/21     Abbreviations: M myopia (nearsighted); A astigmatism; H hyperopia (farsighted); P presbyopia; Mrx spectacle prescription;  CTL contact lenses; OD right eye; OS left eye; OU both eyes  XT exotropia; ET esotropia; PEK punctate epithelial keratitis; PEE punctate epithelial erosions; DES dry eye syndrome; MGD meibomian gland dysfunction; ATs artificial tears; PFAT's preservative free artificial tears; [place] nuclear sclerotic cataract; PSC posterior subcapsular cataract; ERM epi-retinal membrane; PVD posterior vitreous detachment; RD retinal detachment; DM diabetes mellitus; DR diabetic retinopathy; NPDR non-proliferative diabetic retinopathy; PDR proliferative diabetic retinopathy; CSME clinically significant macular edema; DME diabetic macular edema; dbh dot blot hemorrhages; CWS cotton wool spot; POAG primary open angle glaucoma; C/D cup-to-disc ratio; HVF humphrey visual field; GVF goldmann visual field; OCT optical coherence tomography; IOP intraocular pressure; BRVO Branch retinal vein occlusion; CRVO central retinal vein occlusion; CRAO central retinal artery occlusion; BRAO branch retinal artery occlusion; RT retinal tear; SB scleral buckle; PPV pars plana vitrectomy; VH Vitreous hemorrhage; PRP panretinal laser photocoagulation; IVK intravitreal kenalog; VMT vitreomacular traction; MH Macular hole;  NVD neovascularization of the disc; NVE neovascularization elsewhere; AREDS age related eye disease study; ARMD age related macular degeneration; POAG primary open angle glaucoma; EBMD epithelial/anterior basement membrane dystrophy; ACIOL anterior chamber  intraocular lens; IOL intraocular lens; PCIOL posterior chamber intraocular lens; Phaco/IOL phacoemulsification with intraocular lens placement; [place] photorefractive keratectomy; LASIK laser assisted in situ keratomileusis; HTN hypertension; DM diabetes mellitus; COPD chronic obstructive pulmonary disease

## 2021-10-14 NOTE — Assessment & Plan Note (Signed)
Under eye examination on this date, no progression of large trunks of NVI

## 2021-10-14 NOTE — Assessment & Plan Note (Addendum)
OD, chronic active CSME stabilized on intravitreal Avastin.  Also preventing neovascular glaucoma progression, repeat intravitreal Avastin OD today

## 2021-10-14 NOTE — Assessment & Plan Note (Signed)
Stable at 6 weeks 6-day follow-up, do not extend beyond

## 2021-10-15 ENCOUNTER — Other Ambulatory Visit: Payer: Self-pay | Admitting: Internal Medicine

## 2021-10-15 DIAGNOSIS — E785 Hyperlipidemia, unspecified: Secondary | ICD-10-CM

## 2021-10-15 DIAGNOSIS — I5032 Chronic diastolic (congestive) heart failure: Secondary | ICD-10-CM

## 2021-10-16 ENCOUNTER — Other Ambulatory Visit: Payer: Self-pay | Admitting: Internal Medicine

## 2021-10-16 MED ORDER — ISOSORBIDE MONONITRATE ER 60 MG PO TB24: 60.0000 mg | ORAL_TABLET | Freq: Every day | ORAL | 2 refills | Status: DC

## 2021-10-16 MED ORDER — CARVEDILOL 12.5 MG PO TABS: 12.5000 mg | ORAL_TABLET | Freq: Two times a day (BID) | ORAL | 2 refills | Status: DC

## 2021-10-16 NOTE — Telephone Encounter (Signed)
Medication Refill - Medication: carvedilol (COREG) 12.5 MG tablet ?isosorbide mononitrate (IMDUR) 60 MG 24 hr tablet ?Has the patient contacted their pharmacy? No. ?Pt was in the hospital and was prescribed these meds. Pt took his last pills today, and his appt w/ Dr Wynetta Emery is not until Friday, 3/03.  Dr Wynetta Emery has never prescribed these ? ?Preferred Pharmacy (with phone number or street name): [id number] [place]? ?Has the patient been seen for an appointment in the last year OR does the patient have an upcoming appointment? Yes.   ? ?Agent: Please be advised that RX refills may take up to 3 business days. We ask that you follow-up with your pharmacy. ? ?

## 2021-10-16 NOTE — Telephone Encounter (Signed)
Requested Prescriptions  ?Pending Prescriptions Disp Refills  ?? carvedilol (COREG) 12.5 MG tablet 60 tablet 0  ?  Sig: Take 1 tablet (12.5 mg total) by mouth 2 (two) times daily with a meal.  ?  ? Cardiovascular: Beta Blockers 3 Failed - 10/16/2021 12:31 PM  ?  ?  Failed - Cr in normal range and within 360 days  ?  Creat  ?Date Value Ref Range Status  ?09/02/2016 0.80 0.70 - 1.33 mg/dL Final  ?  Comment:  ?    ?For patients > or = 64 years of age: The upper reference limit for ?Creatinine is approximately 13% higher for people identified as ?African-American. ?  ?  ? ?Creatinine, Ser  ?Date Value Ref Range Status  ?09/16/2021 2.85 (H) 0.61 - 1.24 mg/dL Final  ? ?Creatinine, Urine  ?Date Value Ref Range Status  ?06/30/2020 97.16 mg/dL Final  ?  Comment:  ?  Performed at [facility], [place] [address] [place], [place] [address]  ?   ?  ?  Failed - Last BP in normal range  ?  BP Readings from Last 1 Encounters:  ?09/16/21 (!) 143/65  ?   ?  ?  Passed - AST in normal range and within 360 days  ?  AST  ?Date Value Ref Range Status  ?09/07/2021 33 15 - 41 U/L Final  ?   ?  ?  Passed - ALT in normal range and within 360 days  ?  ALT  ?Date Value Ref Range Status  ?09/07/2021 20 0 - 44 U/L Final  ?   ?  ?  Passed - Last Heart Rate in normal range  ?  Pulse Readings from Last 1 Encounters:  ?09/16/21 65  ?   ?  ?  Passed - Valid encounter within last 6 months  ?  Recent Outpatient Visits   ?      ? 4 months ago Type 2 diabetes mellitus with diabetic polyneuropathy, with long-term current use of insulin ([place])  ? [place] Ladell Pier, MD  ? 11 months ago Type 2 diabetes mellitus with diabetic polyneuropathy, with long-term current use of insulin ([place])  ? [place] Ladell Pier, MD  ? 1 year ago Hospital discharge follow-up  ? [place] Ladell Pier, MD  ? 2 years ago Type 2 diabetes mellitus with diabetic  polyneuropathy, with long-term current use of insulin ([place])  ? [place] Karle Plumber B, MD  ? 2 years ago DM type 2 with diabetic peripheral neuropathy ([place])  ? [place], [place], [place]  ?  ?  ?Future Appointments   ?        ? In 2 days Ladell Pier, MD [place]  ?  ? ?  ?  ?  ?? isosorbide mononitrate (IMDUR) 60 MG 24 hr tablet 30 tablet 0  ?  Sig: Take 1 tablet (60 mg total) by mouth daily.  ?  ? Cardiovascular:  Nitrates Failed - 10/16/2021 12:31 PM  ?  ?  Failed - Last BP in normal range  ?  BP Readings from Last 1 Encounters:  ?09/16/21 (!) 143/65  ?   ?  ?  Passed - Last Heart Rate in normal range  ?  Pulse Readings from Last 1 Encounters:  ?09/16/21 65  ?   ?  ?  Passed - Valid encounter within last 12 months  ?  Recent Outpatient Visits   ?      ? 4 months ago Type 2 diabetes mellitus with diabetic polyneuropathy, with long-term current use of insulin ([place])  ? [place] Ladell Pier, MD  ? 11 months ago Type 2 diabetes mellitus with diabetic polyneuropathy, with long-term current use of insulin ([place])  ? [place] Ladell Pier, MD  ? 1 year ago Hospital discharge follow-up  ? [place] Ladell Pier, MD  ? 2 years ago Type 2 diabetes mellitus with diabetic polyneuropathy, with long-term current use of insulin ([place])  ? [place] Karle Plumber B, MD  ? 2 years ago DM type 2 with diabetic peripheral neuropathy ([place])  ? [place], [place], [place]  ?  ?  ?Future Appointments   ?        ? In 2 days Ladell Pier, MD [place]  ?  ? ?  ?  ?  ? ? ?

## 2021-10-18 ENCOUNTER — Ambulatory Visit: Payer: 59 | Attending: Internal Medicine | Admitting: Internal Medicine

## 2021-10-18 ENCOUNTER — Other Ambulatory Visit: Payer: Self-pay

## 2021-10-18 ENCOUNTER — Telehealth: Payer: Self-pay

## 2021-10-18 ENCOUNTER — Other Ambulatory Visit: Payer: Self-pay | Admitting: *Deleted

## 2021-10-18 ENCOUNTER — Encounter: Payer: Self-pay | Admitting: Internal Medicine

## 2021-10-18 VITALS — BP 159/74 | HR 62 | Wt 229.2 lb

## 2021-10-18 DIAGNOSIS — I2510 Atherosclerotic heart disease of native coronary artery without angina pectoris: Secondary | ICD-10-CM | POA: Diagnosis not present

## 2021-10-18 DIAGNOSIS — J9611 Chronic respiratory failure with hypoxia: Secondary | ICD-10-CM | POA: Diagnosis not present

## 2021-10-18 DIAGNOSIS — N184 Chronic kidney disease, stage 4 (severe): Secondary | ICD-10-CM

## 2021-10-18 DIAGNOSIS — H1131 Conjunctival hemorrhage, right eye: Secondary | ICD-10-CM

## 2021-10-18 DIAGNOSIS — I5022 Chronic systolic (congestive) heart failure: Secondary | ICD-10-CM | POA: Diagnosis not present

## 2021-10-18 DIAGNOSIS — Z794 Long term (current) use of insulin: Secondary | ICD-10-CM | POA: Diagnosis not present

## 2021-10-18 DIAGNOSIS — E1142 Type 2 diabetes mellitus with diabetic polyneuropathy: Secondary | ICD-10-CM | POA: Diagnosis not present

## 2021-10-18 DIAGNOSIS — D638 Anemia in other chronic diseases classified elsewhere: Secondary | ICD-10-CM

## 2021-10-18 DIAGNOSIS — Z23 Encounter for immunization: Secondary | ICD-10-CM

## 2021-10-18 DIAGNOSIS — L84 Corns and callosities: Secondary | ICD-10-CM

## 2021-10-18 DIAGNOSIS — Z09 Encounter for follow-up examination after completed treatment for conditions other than malignant neoplasm: Secondary | ICD-10-CM

## 2021-10-18 LAB — GLUCOSE, POCT (MANUAL RESULT ENTRY): POC Glucose: 181 mg/dl — AB (ref 70–99)

## 2021-10-18 MED ORDER — ZOSTER VAC RECOMB ADJUVANTED 50 MCG/0.5ML IM SUSR: 0.5000 mL | Freq: Once | INTRAMUSCULAR | 1 refills | Status: AC

## 2021-10-18 NOTE — Telephone Encounter (Signed)
Order to discontinue home O2 faxed to [place] ?

## 2021-10-18 NOTE — Patient Outreach (Signed)
Derek Lowery. Derek Strahm, MSN, GNP-BC ?Gerontological Nurse Practitioner ?[facility] Care Management ?[phone] ? ?

## 2021-10-18 NOTE — Progress Notes (Signed)
Patient ID: Derek Lowery, male    DOB: [birth year]  MRN: [id number]  CC: Diabetes and Hospitalization Follow-up   Subjective: Derek Lowery is a 66 y.o. male who presents for hosp f/u and chronic ds management His concerns today include:  hx of DM type 2 with retinopathy(laser treatments by Dr. [name]), peripheral neuropathy, and microalbuminuria, HTN, CAD with stent OM2 in 9532, diastolic CHF (EF 02-33%11/3566), glaucoma (blind LT eye) and HL.  Patient presents for hospital follow-up. Patient hospitalized 1/21-30/2023 with acute hypoxic respiratory failure secondary to cardiogenic pulmonary edema/acute decompensated heart failure and community-acquired pneumonia.  Patient was in respiratory distress and hypoxic on admission.  CXR showed bilateral interstitial and alveolar infiltrates.  CT of the chest showed bilateral groundglass opacities.  He was admitted to ICU.  Found to have NSTEMI.  Started on heparin but this had to be discontinued due to a drop in his hemoglobin to 7.  He was not transfused.  Hemoglobin at the time of discharge was around 8.  Echo revealed decreased in EF to 30-35%.  Patient was deemed too high risk for any invasive work-up.  Plan is to maximize medical therapy with aspirin, statin and beta-blocker. Patient diuresed appropriately.  He was able to be weaned to 1 L nasal cannula and was sent home on that.  Treated with antibiotics for multifocal pneumonia. -Developed acute on chronic CKD with Creat of 2.85 and GFR of 24 at the time of discharge.  Today: CHF/hypoxic respiratory failure/CAD with recent NSTEMI -Feels he does not need home O2 anymore.  Pulse ox at rest has been staying between 96-98%.  Lowest reading was 93%.  He stopped using the oxygen 2 to 3 weeks ago. -Supposed to be on torsemide 40 mg BID.  However for last few days he has been taking it just once a day.  He states this is the instruction on his current bottle. -Denies any chest pains.  Some SOB if  he walks a long distance. Endorses a little lower extremity edema.  No PND orthopnea.  Sleeps on 2 pillows. -Weighs himself every morning.  Weight this morning was 223 pounds down 6 pounds from hospitalization. -BP noted to be elevated today.  Reports being out of carvedilol for a few days.  He has already called for refills and plans to pick up today.  DM: A1c was 7.1. Humalog discontinued at the time of hospital discharge.  Lantus decreased from 38 units to 10 units daily. -Was able to get Dexcom and is using it.  Currently out of the sensor x3 days.  Plans to get a refill today. -Reports blood sugars have been between 115-130.  Highest reading he has had was around 200 twice. -Found Dexcom helpful because he can see what types of foods cause spikes in his blood sugar and he avoids them.  CKD: Advance from stage III to stage IV on recent hospitalization with diuresis.  Reports making good urine.  Anemia chronic disease: Reports slight dizziness at times.  Since discharge from the hospital, he has had a nurse coming out to the house.  Her last day was yesterday.  He also had physical therapy once a day for 3 weeks and has completed that.  He found both to be helpful.  Patient Active Problem List   Diagnosis Date Noted   Acute pulmonary edema ([place])    Multifocal pneumonia 61/68/3729   Acute systolic CHF (congestive heart failure) ([place]) 09/07/2021   Chronic diastolic CHF (congestive heart  failure) ([place]) 06/17/2021   Neovascular glaucoma, right eye, severe stage 06/13/2021   Acute exacerbation of CHF (congestive heart failure) ([place]) 12/24/2020   Pneumatouria 06/28/2020   Acute respiratory failure with hypoxia ([place]) 06/28/2020   Pneumonia due to COVID-19 virus 06/28/2020   Proliferative diabetic retinopathy of right eye with macular edema associated with type 2 diabetes mellitus ([place]) 11/30/2019   Rubeosis iridis of right eye 11/30/2019   Glaucoma with pupillary seclusion, unspecified  laterality, severe stage 11/30/2019   Trigger middle finger of right hand 10/17/2019   Pain due to onychomycosis of toenails of both feet 08/07/2019   Educated about COVID-19 virus infection 12/02/2018   RBBB 12/02/2018   Benign paroxysmal positional vertigo 06/25/2018   Pre-ulcerative corn or callous 10/13/2017   Anemia 10/13/2017   Acute kidney injury superimposed on CKD ([place])    Chronic diarrhea 06/09/2017   Microalbuminuria 03/07/2017   Dry skin 03/05/2017   Glaucoma 03/05/2017   Legally blind 03/05/2017   Retinopathy due to secondary diabetes ([place]) 02/06/2016   Diabetic polyneuropathy associated with type 2 diabetes mellitus ([place]) 01/12/2015   Fournier's gangrene into true pelvis s/p I&D 05/05/2014 05/05/2014   Type 2 diabetes mellitus with hyperlipidemia ([place]) 07/17/2011   NSTEMI (non-ST elevated myocardial infarction) ([place]) 07/17/2011   Essential hypertension 07/17/2011     Current Outpatient Medications on File Prior to Visit  Medication Sig Dispense Refill   aspirin EC 81 MG tablet Take 1 tablet (81 mg total) by mouth daily. 30 tablet 4   atorvastatin (LIPITOR) 40 MG tablet TAKE 1 TABLET BY MOUTH EVERY DAY 30 tablet 0   brimonidine (ALPHAGAN) 0.2 % ophthalmic solution Place 1 drop into the right eye 2 (two) times daily.      carvedilol (COREG) 12.5 MG tablet Take 1 tablet (12.5 mg total) by mouth 2 (two) times daily with a meal. 60 tablet 2   Continuous Blood Gluc Receiver (DEXCOM G6 RECEIVER) DEVI Use to check blood sugar four times daily. E11.42 1 each 0   Continuous Blood Gluc Sensor (DEXCOM G6 SENSOR) MISC Use to check blood sugar four times daily. E11.42 3 each 3   Continuous Blood Gluc Transmit (DEXCOM G6 TRANSMITTER) MISC Use to check blood sugar four times daily. E11.42 1 each 3   dorzolamide (TRUSOPT) 2 % ophthalmic solution Place 1 drop into the right eye 2 (two) times daily.     isosorbide mononitrate (IMDUR) 60 MG 24 hr tablet Take 1 tablet (60 mg total) by mouth  daily. 30 tablet 2   Multiple Vitamins-Minerals (MULTIVITAMIN WITH MINERALS) tablet Take 1 tablet by mouth daily.     RHOPRESSA 0.02 % SOLN Place 1 drop into the right eye at bedtime.     timolol (TIMOPTIC) 0.5 % ophthalmic solution Place 1 drop into the right eye 2 (two) times daily.     torsemide 40 MG TABS Take 40 mg by mouth 2 (two) times daily. 60 tablet 0   acetaminophen (TYLENOL) 325 MG tablet Take 2 tablets (650 mg total) by mouth every 6 (six) hours as needed for mild pain (or Fever >/= 101).     ammonium lactate (AMLACTIN) 12 % lotion Apply to both feet twice daily for dry skin. (Patient taking differently: 1 application as needed for dry skin. Apply to both feet twice daily for dry skin.) 400 g 6   BD PEN NEEDLE NANO 2ND GEN 32G X 4 MM MISC USE AS DIRECTED 3 TIMES A DAY. PLEASE SCHEDULE AN APPOINTMENT FOR ADDITIONAL  REFILLS. 100 each 3   blood glucose meter kit and supplies KIT Dispense based on patient and insurance preference. Use up to four times daily as directed. One Touch Verio 1 each 0   glucose monitoring kit (FREESTYLE) monitoring kit 1 each by Does not apply route as needed for other. 1 each 0   hydrALAZINE (APRESOLINE) 100 MG tablet Take 1 tablet (100 mg total) by mouth 3 (three) times daily. 90 tablet 0   insulin glargine (LANTUS SOLOSTAR) 100 UNIT/ML Solostar Pen Inject 10 Units into the skin at bedtime. 15 mL 0   Lancets MISC Use as directed.  Accu chek 2 100 each 11   latanoprost (XALATAN) 0.005 % ophthalmic solution Place 1 drop into the right eye at bedtime.     No current facility-administered medications on file prior to visit.    Allergies  Allergen Reactions   Coreg [Carvedilol] Other (See Comments)    Redness and swelling of calf   Empagliflozin Other (See Comments)    Four gangrene    Social History   Socioeconomic History   Marital status: Single    Spouse name: Not on file   Number of children: Not on file   Years of education: Not on file    Highest education level: Not on file  Occupational History   Occupation: [place]    Employer: [employer]  Tobacco Use   Smoking status: Never   Smokeless tobacco: Never  Vaping Use   Vaping Use: Never used  Substance and Sexual Activity   Alcohol use: No   Drug use: No   Sexual activity: Never  Other Topics Concern   Not on file  Social History Narrative   Works at [employer]   [phone] [employer]   Social Determinants of Health   Financial Resource Strain: Low Risk    Difficulty of Paying Living Expenses: Not hard at all  Food Insecurity: No Food Insecurity   Worried About Charity fundraiser in the Last Year: Never true   Arboriculturist in the Last Year: Never true  Transportation Needs: Public librarian (Medical): Yes   Lack of Transportation (Non-Medical): Yes  Physical Activity: Inactive   Days of Exercise per Week: 0 days   Minutes of Exercise per Session: 0 min  Stress: Stress Concern Present   Feeling of Stress : To some extent  Social Connections: Socially Isolated   Frequency of Communication with Friends and Family: Never   Frequency of Social Gatherings with Friends and Family: More than three times a week   Attends Religious Services: Never   Marine scientist or Organizations: No   Attends Music therapist: Never   Marital Status: Never married  Human resources officer Violence: Not At Risk   Fear of Current or Ex-Partner: No   Emotionally Abused: No   Physically Abused: No   Sexually Abused: No    Family History  Problem Relation Age of Onset   Coronary artery disease Father        Developed in his 68s   Kidney disease Father    Diabetes Father    Melanoma Father    Rectal cancer Father    Heart failure Mother    Hypertension Mother    Diabetes Sister    Diabetes Brother    Colon cancer Neg Hx    Esophageal cancer Neg Hx    Stomach cancer Neg Hx     Past Surgical  History:  Procedure  Laterality Date   CATARACT EXTRACTION Right 2019   Dr. Katy Fitch   CHOLECYSTECTOMY N/A 09/21/2017   Procedure: LAPAROSCOPIC CHOLECYSTECTOMY WITH INTRAOPERATIVE CHOLANGIOGRAM;  Surgeon: Michael Boston, MD;  Location: [facility];  Service: General;  Laterality: N/A;   COLONOSCOPY  2000   Dr. Collene Mares   EYE SURGERY     IRRIGATION AND DEBRIDEMENT ABSCESS Left 05/05/2014   Procedure: IRRIGATION AND DEBRIDEMENT ABSCESS left buttock;  Surgeon: Michael Boston, MD;  Location: [facility];  Service: General;  Laterality: Left;   LEFT HEART CATHETERIZATION WITH CORONARY ANGIOGRAM N/A 07/18/2011   Procedure: LEFT HEART CATHETERIZATION WITH CORONARY ANGIOGRAM;  Surgeon: Hillary Bow, MD;  Location: [facility] CATH LAB;  Service: Cardiovascular;  Laterality: N/A;   PERCUTANEOUS CORONARY STENT INTERVENTION (PCI-S)  07/18/2011   Procedure: PERCUTANEOUS CORONARY STENT INTERVENTION (PCI-S);  Surgeon: Hillary Bow, MD;  Location: [facility] CATH LAB;  Service: Cardiovascular;;    ROS: Review of Systems Negative except as stated above  PHYSICAL EXAM: BP (!) 159/74    Pulse 62    Wt 229 lb 3.7 oz (104 kg)    SpO2 96%    BMI 32.89 kg/m   Wt Readings from Last 3 Encounters:  10/18/21 229 lb 3.7 oz (104 kg)  09/16/21 229 lb (103.9 kg)  06/17/21 254 lb 3.2 oz (115.3 kg)  Repeat blood pressure 155/78  Physical Exam Pulse ox with ambulation on room air wearing his mask stayed between 94 to 96%.  General appearance -pleasant elderly Caucasian male who appears chronically ill and is in NAD.  He ambulates with a standard walker. Mental status - normal mood, behavior, speech, dress, motor activity, and thought processes Eyes -he has moderate subconjunctival hemorrhage noted in the right eye laterally. Mouth - mucous membranes moist, pharynx normal without lesions Neck - supple, no significant adenopathy Chest - clear to auscultation, no wheezes, rales or rhonchi, symmetric air entry Heart - normal rate, regular rhythm, normal S1, S2, no  murmurs, rubs, clicks or gallops Extremities -trace to 1+ edema of the lower extremities. Skin: Scaly skin on both lower legs and feet. Diabetic Foot Exam - Simple   Simple Foot Form Diabetic Foot exam was performed with the following findings: Yes 10/18/2021  1:00 PM  Visual Inspection See comments: Yes Sensation Testing See comments: Yes Pulse Check Posterior Tibialis and Dorsalis pulse intact bilaterally: Yes Comments Dry scaly skin on both feet.  Toenails are discolored and overgrown.  Callus on the lateral aspect of the left third toe with potential to ulcerate.  Small callus on the lateral aspect of the right foot on the sole.     CMP Latest Ref Rng & Units 09/16/2021 09/15/2021 09/14/2021  Glucose 70 - 99 mg/dL 185(H) 266(H) 222(H)  BUN 8 - 23 mg/dL 78(H) 76(H) 76(H)  Creatinine 0.61 - 1.24 mg/dL 2.85(H) 2.73(H) 2.88(H)  Sodium 135 - 145 mmol/L 135 133(L) 135  Potassium 3.5 - 5.1 mmol/L 3.7 3.8 3.7  Chloride 98 - 111 mmol/L 88(L) 88(L) 90(L)  CO2 22 - 32 mmol/L 35(H) 37(H) 36(H)  Calcium 8.9 - 10.3 mg/dL 8.9 8.5(L) 8.5(L)  Total Protein 6.5 - 8.1 g/dL - - -  Total Bilirubin 0.3 - 1.2 mg/dL - - -  Alkaline Phos 38 - 126 U/L - - -  AST 15 - 41 U/L - - -  ALT 0 - 44 U/L - - -   Lipid Panel     Component Value Date/Time   CHOL 95 09/08/2021  0500   CHOL 150 06/17/2021 1145   TRIG 38 09/08/2021 0500   HDL 33 (L) 09/08/2021 0500   HDL 35 (L) 06/17/2021 1145   CHOLHDL 2.9 09/08/2021 0500   VLDL 8 09/08/2021 0500   LDLCALC 54 09/08/2021 0500   LDLCALC 87 06/17/2021 1145    CBC    Component Value Date/Time   WBC 9.6 09/14/2021 0315   RBC 2.80 (L) 09/14/2021 0315   HGB 8.2 (L) 09/16/2021 0536   HGB 11.1 (L) 06/17/2021 1145   HCT 25.4 (L) 09/16/2021 0536   HCT 34.2 (L) 06/17/2021 1145   PLT 208 09/14/2021 0315   PLT 199 06/17/2021 1145   MCV 86.1 09/14/2021 0315   MCV 86 06/17/2021 1145   MCH 28.2 09/14/2021 0315   MCHC 32.8 09/14/2021 0315   RDW 13.7 09/14/2021  0315   RDW 14.3 06/17/2021 1145   LYMPHSABS 0.5 (L) 09/07/2021 1030   LYMPHSABS 1.8 07/12/2019 1042   MONOABS 0.8 09/07/2021 1030   EOSABS 0.0 09/07/2021 1030   EOSABS 0.3 07/12/2019 1042   BASOSABS 0.0 09/07/2021 1030   BASOSABS 0.1 07/12/2019 1042    ASSESSMENT AND PLAN: 1. Hospital discharge follow-up   2. Chronic systolic congestive heart failure (HCC) Weight has stayed below weight that he was on hospital discharge based on his home weights.  However he does have some lower extremity edema today.  He has been taking torsemide 40 mg once a day instead of twice a day. -Recommend that he take the torsemide is 40 mg in the morning and 20 mg in the evening.  Continue to weigh himself. Continue carvedilol, isosorbide and hydralazine.  Holding off on ACE due to kidney function at this time - Ambulatory referral to Cardiology  3. Coronary artery disease involving native coronary artery of native heart without angina pectoris Continue carvedilol, aspirin, atorvastatin - Ambulatory referral to Cardiology  4. Type 2 diabetes mellitus with diabetic polyneuropathy, with long-term current use of insulin ([place]) Reported blood sugars are good.  He will continue the Lantus 10 units daily.  He will let me know if blood sugar starts to increase, if so I told him he can restart low-dose of mealtime Humalog insulin by 2 to 3 units. - POCT glucose (manual entry)  5. Chronic respiratory failure with hypoxia (HCC) Oxygen level staying above 94% with ambulation today.  Patient wants to have the oxygen removed from his home.  I will sign an order for the medical supply company to pick this up.  6. CKD (chronic kidney disease) stage 4, GFR 15-29 ml/min (HCC) Worsening due to cardio-kidney syndrome - Comprehensive metabolic panel - Ambulatory referral to Nephrology  7. Anemia of chronic disease - CBC  8. Subconjunctival hemorrhage of right eye Patient reports having had an injection to the right eye  3 days ago by his ophthalmologist for treatment of retinopathy.  He denies any pain in the eye at this time.  I told him that this should resolve on its own.  9. Pre-ulcerative corn or callous - Ambulatory referral to Podiatry  10. Need for shingles vaccine Advised to get shingles vaccine.  Patient given prescription so that he can get the first shot of the Shingrix at his pharmacy. - Zoster Vaccine Adjuvanted [facility]) injection; Inject 0.5 mLs into the muscle once for 1 dose.  Dispense: 0.5 mL; Refill: 1    Patient was given the opportunity to ask questions.  Patient verbalized understanding of the plan and was able to repeat  key elements of the plan.   This documentation was completed using Radio producer.  Any transcriptional errors are unintentional.  Orders Placed This Encounter  Procedures   CBC   Comprehensive metabolic panel   Ambulatory referral to Nephrology   Ambulatory referral to Cardiology   Ambulatory referral to Podiatry   POCT glucose (manual entry)     Requested Prescriptions   Signed Prescriptions Disp Refills   Zoster Vaccine Adjuvanted [facility]) injection 0.5 mL 1    Sig: Inject 0.5 mLs into the muscle once for 1 dose.    No follow-ups on file.  Karle Plumber, MD, FACP

## 2021-10-19 LAB — COMPREHENSIVE METABOLIC PANEL
ALT: 13 IU/L (ref 0–44)
AST: 23 IU/L (ref 0–40)
Albumin/Globulin Ratio: 1.9 (ref 1.2–2.2)
Albumin: 4.3 g/dL (ref 3.8–4.8)
Alkaline Phosphatase: 70 IU/L (ref 44–121)
BUN/Creatinine Ratio: 21 (ref 10–24)
BUN: 39 mg/dL — ABNORMAL HIGH (ref 8–27)
Bilirubin Total: 0.4 mg/dL (ref 0.0–1.2)
CO2: 26 mmol/L (ref 20–29)
Calcium: 9.5 mg/dL (ref 8.6–10.2)
Chloride: 102 mmol/L (ref 96–106)
Creatinine, Ser: 1.86 mg/dL — ABNORMAL HIGH (ref 0.76–1.27)
Globulin, Total: 2.3 g/dL (ref 1.5–4.5)
Glucose: 168 mg/dL — ABNORMAL HIGH (ref 70–99)
Potassium: 4.1 mmol/L (ref 3.5–5.2)
Sodium: 142 mmol/L (ref 134–144)
Total Protein: 6.6 g/dL (ref 6.0–8.5)
eGFR: 40 mL/min/{1.73_m2} — ABNORMAL LOW (ref >59)

## 2021-10-19 LAB — CBC
Hematocrit: 30.7 % — ABNORMAL LOW (ref 37.5–51.0)
Hemoglobin: 10.3 g/dL — ABNORMAL LOW (ref 13.0–17.7)
MCH: 28.9 pg (ref 26.6–33.0)
MCHC: 33.6 g/dL (ref 31.5–35.7)
MCV: 86 fL (ref 79–97)
Platelets: 150 10*3/uL (ref 150–450)
RBC: 3.56 x10E6/uL — ABNORMAL LOW (ref 4.14–5.80)
RDW: 14.5 % (ref 11.6–15.4)
WBC: 5.7 10*3/uL (ref 3.4–10.8)

## 2021-10-21 ENCOUNTER — Other Ambulatory Visit: Payer: Self-pay | Admitting: *Deleted

## 2021-10-21 NOTE — Patient Outreach (Signed)
[facility]) Care Management Geriatric Nurse Practitioner Note   10/21/2021 Name:  Derek Lowery MRN:  [id number] DOB:  11/30/1955  Summary: Pt HF status is stable. Glucose medication regimen changed by primary  Recommendations/Changes made from today's visit: Follow HF and DM Action Plans to avoid complications. Call Lowery or NP for any problems.  Subjective: Derek Lowery is an 66 y.o. year old male who is a primary patient of Derek Lowery. The care management team was consulted for assistance with care management and/or care coordination needs.    Geriatric Nurse Practitioner completed Telephone Visit today.   Patient Active Problem List   Diagnosis Date Noted   Acute pulmonary edema ([place])    Multifocal pneumonia 27/78/2423   Acute systolic CHF (congestive heart failure) ([place]) 09/07/2021   Chronic diastolic CHF (congestive heart failure) (Derek Lowery) 06/17/2021   Neovascular glaucoma, right eye, severe stage 06/13/2021   Acute exacerbation of CHF (congestive heart failure) (Derek Lowery) 12/24/2020   Pneumatouria 06/28/2020   Acute respiratory failure with hypoxia ([place]) 06/28/2020   Pneumonia due to COVID-19 virus 06/28/2020   Proliferative diabetic retinopathy of right eye with macular edema associated with type 2 diabetes mellitus (Derek Lowery) 11/30/2019   Rubeosis iridis of right eye 11/30/2019   Glaucoma with pupillary seclusion, unspecified laterality, severe stage 11/30/2019   Trigger middle finger of right hand 10/17/2019   Pain due to onychomycosis of toenails of both feet 08/07/2019   Educated about COVID-19 virus infection 12/02/2018   RBBB 12/02/2018   Benign paroxysmal positional vertigo 06/25/2018   Pre-ulcerative corn or callous 10/13/2017   Anemia 10/13/2017   Acute kidney injury superimposed on CKD ([place])    Chronic diarrhea 06/09/2017   Microalbuminuria 03/07/2017   Dry skin 03/05/2017   Glaucoma 03/05/2017   Legally blind 03/05/2017   Retinopathy  due to secondary diabetes (Derek Lowery) 02/06/2016   Diabetic polyneuropathy associated with type 2 diabetes mellitus (Derek Lowery) 01/12/2015   Fournier's gangrene into true pelvis s/p I&D 05/05/2014 05/05/2014   Type 2 diabetes mellitus with hyperlipidemia (Derek Lowery) 07/17/2011   NSTEMI (non-ST elevated myocardial infarction) (Derek Lowery) 07/17/2011   Essential hypertension 07/17/2011   Allergies as of 10/21/2021       Reactions   Coreg [carvedilol] Other (See Comments)   Redness and swelling of calf   Empagliflozin Other (See Comments)   Four gangrene        Medication List        Accurate as of October 21, 2021  5:01 PM. If you have any questions, ask your nurse or doctor.          acetaminophen 325 MG tablet Commonly known as: TYLENOL Take 2 tablets (650 mg total) by mouth every 6 (six) hours as needed for mild pain (or Fever >/= 101).   ammonium lactate 12 % lotion Commonly known as: AmLactin Apply to both feet twice daily for dry skin. What changed:  how much to take when to take this reasons to take this   aspirin EC 81 MG tablet Take 1 tablet (81 mg total) by mouth daily.   atorvastatin 40 MG tablet Commonly known as: LIPITOR TAKE 1 TABLET BY MOUTH EVERY DAY   BD Pen Needle Nano 2nd Gen 32G X 4 MM Misc Generic drug: Insulin Pen Needle USE AS DIRECTED 3 TIMES A DAY. PLEASE SCHEDULE AN APPOINTMENT FOR ADDITIONAL REFILLS.   blood glucose meter kit and supplies Kit Dispense based on patient and insurance preference. Use up to four times  daily as directed. One Touch Verio   brimonidine 0.2 % ophthalmic solution Commonly known as: ALPHAGAN Place 1 drop into the right eye 2 (two) times daily.   carvedilol 12.5 MG tablet Commonly known as: COREG Take 1 tablet (12.5 mg total) by mouth 2 (two) times daily with a meal.   Dexcom G6 Receiver Devi Use to check blood sugar four times daily. E11.42   Dexcom G6 Sensor Misc Use to check blood sugar four times daily. E11.42   Dexcom G6  Transmitter Misc Use to check blood sugar four times daily. E11.42   dorzolamide 2 % ophthalmic solution Commonly known as: TRUSOPT Place 1 drop into the right eye 2 (two) times daily.   glucose monitoring kit monitoring kit 1 each by Does not apply route as needed for other.   hydrALAZINE 100 MG tablet Commonly known as: APRESOLINE Take 1 tablet (100 mg total) by mouth 3 (three) times daily.   isosorbide mononitrate 60 MG 24 hr tablet Commonly known as: IMDUR Take 1 tablet (60 mg total) by mouth daily.   Lancets Misc Use as directed.  Accu chek 2   Lantus SoloStar 100 UNIT/ML Solostar Pen Generic drug: insulin glargine Inject 10 Units into the skin at bedtime.   latanoprost 0.005 % ophthalmic solution Commonly known as: XALATAN Place 1 drop into the right eye at bedtime.   multivitamin with minerals tablet Take 1 tablet by mouth daily.   Rhopressa 0.02 % Soln Generic drug: Netarsudil Dimesylate Place 1 drop into the right eye at bedtime.   timolol 0.5 % ophthalmic solution Commonly known as: TIMOPTIC Place 1 drop into the right eye 2 (two) times daily.   Torsemide 40 MG Tabs Take 40 mg by mouth 2 (two) times daily.       Care Plan  Review of patient past medical history, allergies, medications, health status, including review of consultants reports, laboratory and other test data, was performed as part of comprehensive evaluation for care management services.   Care Plan : [facility] NP Plan of Care  Updates made by [name], NP since 10/21/2021 12:00 AM     Problem: Heart Failure   Priority: High  Onset Date: 10/21/2021     Long-Range Goal: Patient will learn and follow HF Action Plan as evidenced by pt report and no hospitalizations over the next 90 days.   Start Date: 10/21/2021  Expected End Date: 01/22/2022  Priority: High  Note:   Current Barriers:  Knowledge Deficits related to plan of care for management of CHF   RNCM Clinical Goal(s):  Patient  will verbalize basic understanding of CHF disease process and self health management plan as evidenced by Being Able to discuss HF Action Plan and Zones  through collaboration with RN Care manager, provider, and care team.   Interventions: Inter-disciplinary care team collaboration (see longitudinal plan of care) Evaluation of current treatment plan related to  self management and patient's adherence to plan as established by provider       Assessed knowledge, new to education regarding HF. Has learned he needs to weigh daily and of course take his medications. He is able to tell me his weight 221#. Weight has actually gone down 5# since his discharge. We discussed the Zones today Green is Good, Yellow call Lowery and Avoid the Red Zone-prevent a hospitalization. Discussed low salt diet which he is working on, learning more about this. Provided education on reading labels, avoid process foods and fast foods. Needs to get  some exercise other than walking around the house. Monitor swelling and respiratory status. Call for increased swelling   Patient Goals/Self-Care Activities: Take medications as prescribed   Attend all scheduled provider appointments      Problem: DIABETES MANAGEMENT   Priority: Medium  Onset Date: 10/21/2021     Long-Range Goal: Patient will learn the ABCs of Diabetes Management as evidenced by discussions and pt outcomes with NP over the next 3 months.   Start Date: 10/21/2021  Expected End Date: 01/22/2022  Priority: Medium  Note:   Current Barriers:  Chronic Disease Management support and education needs related to DMII  RNCM Clinical Goal(s):  Patient will verbalize basic understanding of DMII disease process and self health management plan as evidenced by pt report and chart review.  through collaboration with RN Care manager, provider, and care team.   Interventions: Inter-disciplinary care team collaboration (see longitudinal plan of care) Evaluation of current  treatment plan related to  self management and patient's adherence to plan as established by provider       Pt has had DM for a number of years but has not concentrated on efforts to manage optimally. Wife is a diabetic also and this is helpful. Pt uses a dexcom 6 for continuous glucose monitoring and checks his values 6 times a day. His provider just changed his insulin from 70/30 to Lantus and they are on a titration schedule to get him to best control. He needs diet coaching. Ensure he meets his annul wellness needs: AWV, eye exam, dental exam, foot exam, immunizations.  Patient Goals/Self-Care Activities: Take medications as prescribed   Attend all scheduled provider appointments        Plan: Telephone follow up appointment with care management team member scheduled for:  11/04/21 . Pt agreed to discussed plan of care.  Eulah Pont. Myrtie Neither, MSN, [facility] Gerontological Nurse Practitioner [facility] Care Management [phone]

## 2021-10-21 NOTE — Patient Instructions (Signed)
Visit Information ? ?Thank you for taking time to visit with me today. Please don't hesitate to contact me if I can be of assistance to you before our next scheduled telephone appointment. ? ?Following are the goals we discussed today:  ?(Copy and paste patient goals from clinical care plan here) ? ?Our next appointment is by telephone on 11/04/21. ? ?Following is a copy of your care plan:  ?Care Plan : [facility] NP Plan of Care  ?Updates made by [name], NP since 10/21/2021 12:00 AM  ?  ? ?Problem: Heart Failure   ?Priority: High  ?Onset Date: 10/21/2021  ?  ? ?Long-Range Goal: Patient will learn and follow HF Action Plan as evidenced by pt report and no hospitalizations over the next 90 days.   ?Start Date: 10/21/2021  ?Expected End Date: 01/22/2022  ?Priority: High  ?Note:   ?Current Barriers:  ?Knowledge Deficits related to plan of care for management of CHF  ? ?RNCM Clinical Goal(s):  ?Patient will verbalize basic understanding of CHF disease process and self health management plan as evidenced by Being Able to discuss HF Action Plan and Zones  through collaboration with RN Care manager, provider, and care team.  ? ?Interventions: ?Inter-disciplinary care team collaboration (see longitudinal plan of care) ?Evaluation of current treatment plan related to  self management and patient's adherence to plan as established by provider ?      Assessed knowledge, new to education regarding HF. Has learned he needs to weigh daily and of course take his medications. He is able to tell me his weight 221#. Weight has actually gone down 5# since his discharge. We discussed the Zones today Green is Good, Yellow call MD and Avoid the Red Zone-prevent a hospitalization. Discussed low salt diet which he is working on, learning more about this. Provided education on reading labels, avoid process foods and fast foods. Needs to get some exercise other than walking around the house. Monitor swelling and respiratory status. Call for  increased swelling  ? ?Patient Goals/Self-Care Activities: ?Take medications as prescribed   ?Attend all scheduled provider appointments ? ? ?  ? ?Problem: DIABETES MANAGEMENT   ?Priority: Medium  ?Onset Date: 10/21/2021  ?  ? ?Long-Range Goal: Patient will learn the ABCs of Diabetes Management as evidenced by discussions and pt outcomes with NP over the next 3 months.   ?Start Date: 10/21/2021  ?Expected End Date: 01/22/2022  ?Priority: Medium  ?Note:   ?Current Barriers:  ?Chronic Disease Management support and education needs related to DMII ? ?RNCM Clinical Goal(s):  ?Patient will verbalize basic understanding of DMII disease process and self health management plan as evidenced by pt report and chart review.  through collaboration with Consulting civil engineer, provider, and care team.  ? ?Interventions: ?Inter-disciplinary care team collaboration (see longitudinal plan of care) ?Evaluation of current treatment plan related to  self management and patient's adherence to plan as established by provider ?      Pt has had DM for a number of years but has not concentrated on efforts to manage optimally. Wife is a diabetic also and this is helpful. Pt uses a dexcom 6 for continuous glucose monitoring and checks his values 6 times a day. His provider just changed his insulin from 70/30 to Lantus and they are on a titration schedule to get him to best control. He needs diet coaching. Ensure he meets his annul wellness needs: AWV, eye exam, dental exam, foot exam, immunizations. ? ?Patient Goals/Self-Care Activities: ?Take  medications as prescribed   ?Attend all scheduled provider appointments ? ? ?  ? ? ?The patient verbalized understanding of instructions, educational materials, and care plan provided today and agreed to receive a mailed copy of patient instructions, educational materials, and care plan.  ? ?Telephone follow up appointment with care management team member scheduled for: ? ?Eulah Pont. Karter Haire, MSN,  GNP-BC ?Gerontological Nurse Practitioner ?[facility] Care Management ?[phone] ? ? ? ? ?

## 2021-11-04 ENCOUNTER — Encounter: Payer: Self-pay | Admitting: *Deleted

## 2021-11-04 ENCOUNTER — Other Ambulatory Visit: Payer: Self-pay | Admitting: *Deleted

## 2021-11-04 NOTE — Patient Outreach (Signed)
[facility]) Care Management ?Geriatric Nurse Practitioner Note ? ? ?11/04/2021 ?Name:  Derek Lowery MRN:  [id number] DOB:  Aug 13, 1956 ? ?Summary: ?Pt is making progress in improving his self care for HF and DM control. ? ?Recommendations/Changes made from today's visit: ?Make dental and podiatry appts. Study new DM diet information. ? ?Subjective: ?Derek Lowery is an 66 y.o. year old male who is a primary patient of Ladell Pier, MD. The care management team was consulted for assistance with care management and/or care coordination needs.   ? ?Geriatric Nurse Practitioner completed Telephone Visit today.  ? ?Objective: ? ?Medications Reviewed Today   ? ? Reviewed by [name], NP (Nurse Practitioner) on 10/21/21 at 1609  Med List Status: <None>  ? ?Medication Order Taking? Sig Documenting Provider Last Dose Status Informant  ?acetaminophen (TYLENOL) 325 MG tablet [id number]  Take 2 tablets (650 mg total) by mouth every 6 (six) hours as needed for mild pain (or Fever >/= 101). Arrien, Jimmy Picket, MD  Active   ?ammonium lactate (AMLACTIN) 12 % lotion [id number] No Apply to both feet twice daily for dry skin.  ?Patient taking differently: 1 application as needed for dry skin. Apply to both feet twice daily for dry skin.  ? Ladell Pier, MD Past Month Active Self  ?aspirin EC 81 MG tablet [id number] No Take 1 tablet (81 mg total) by mouth daily. [place] Donovan, [place] Taking Active Self  ?atorvastatin (LIPITOR) 40 MG tablet [id number] No TAKE 1 TABLET BY MOUTH EVERY DAY Ladell Pier, MD Taking Active   ?BD PEN NEEDLE NANO 2ND GEN 32G X 4 MM MISC [id number] No USE AS DIRECTED 3 TIMES A DAY. PLEASE SCHEDULE AN APPOINTMENT FOR ADDITIONAL REFILLS. Ladell Pier, MD Taking Active Self  ?blood glucose meter kit and supplies KIT [id number] No Dispense based on patient and insurance preference. Use up to four times daily as directed. One Touch Jay Schlichter, Dalbert Batman, MD  Taking Active Self  ?brimonidine (ALPHAGAN) 0.2 % ophthalmic solution [id number] No Place 1 drop into the right eye 2 (two) times daily.  [provider] Taking Active Self  ?         ?Med Note Minta Balsam Dec 24, 2020 10:50 PM)    ?carvedilol (COREG) 12.5 MG tablet [id number] No Take 1 tablet (12.5 mg total) by mouth 2 (two) times daily with a meal. Ladell Pier, MD Taking Active   ?Continuous Blood Gluc Receiver (DEXCOM G6 RECEIVER) DEVI [id number] No Use to check blood sugar four times daily. E11.42 Ladell Pier, MD Taking Active   ?Continuous Blood Gluc Sensor (DEXCOM G6 SENSOR) MISC [id number] No Use to check blood sugar four times daily. E11.42 Ladell Pier, MD Taking Active   ?Continuous Blood Gluc Transmit (DEXCOM G6 TRANSMITTER) MISC [id number] No Use to check blood sugar four times daily. E11.42 Ladell Pier, MD Taking Active   ?dorzolamide (TRUSOPT) 2 % ophthalmic solution [id number] No Place 1 drop into the right eye 2 (two) times daily. [provider] Taking Active Self  ?         ?Med Note Minta Balsam Dec 24, 2020 10:48 PM)    ?glucose monitoring kit (FREESTYLE) monitoring kit [id number] No 1 each by Does not apply route as needed for other. Ladell Pier, MD Taking Active Self  ?hydrALAZINE (APRESOLINE) 100 MG tablet [id number]  Take 1 tablet (100 mg total) by mouth 3 (three)  times daily. Arrien, Jimmy Picket, MD  Expired 10/16/21 2359   ?insulin glargine (LANTUS SOLOSTAR) 100 UNIT/ML Solostar Pen [id number]  Inject 10 Units into the skin at bedtime. Arrien, Jimmy Picket, MD  Active   ?isosorbide mononitrate (IMDUR) 60 MG 24 hr tablet [id number] No Take 1 tablet (60 mg total) by mouth daily. Ladell Pier, MD Taking Active   ?Lancets MISC [id number] No Use as directed.  Accu chek 2 Ladell Pier, MD Taking Active Self  ?latanoprost (XALATAN) 0.005 % ophthalmic solution [id number] No Place 1 drop into the right eye at bedtime.  [provider] Taking Active Self  ?         ?Med Note Lodema Hong, COURTNEY A   Sat Sep 07, 2021  3:27 PM) Patient is not sure if he is using these eye drops.last filled 06/14/21 90DS  ?Multiple Vitamins-Minerals (MULTIVITAMIN WITH MINERALS) tablet [id number] No Take 1 tablet by mouth daily. [provider] Taking Active Self  ?RHOPRESSA 0.02 % SOLN [id number] No Place 1 drop into the right eye at bedtime. [provider] Taking Active Self  ?         ?Med Note Minta Balsam Dec 24, 2020 10:48 PM)    ?timolol (TIMOPTIC) 0.5 % ophthalmic solution [id number] No Place 1 drop into the right eye 2 (two) times daily. [provider] Taking Active Self  ?torsemide 40 MG TABS [id number] No Take 40 mg by mouth 2 (two) times daily. Arrien, Jimmy Picket, MD Taking Active   ? ?  ?  ? ?  ? ? ? ?SDOH:  (Social Determinants of Health) assessments and interventions performed:  ? ? ?Care Plan ? ?Review of patient past medical history, allergies, medications, health status, including review of consultants reports, laboratory and other test data, was performed as part of comprehensive evaluation for care management services.  ? ?Care Plan : [facility] NP Plan of Care  ?Updates made by [name], NP since 11/04/2021 12:00 AM  ?  ? ?Problem: Heart Failure   ?Priority: High  ?Onset Date: 10/21/2021  ?  ? ?Long-Range Goal: Patient will learn and follow HF Action Plan as evidenced by pt report and no hospitalizations over the next 90 days.   ?Start Date: 10/21/2021  ?Expected End Date: 01/22/2022  ?This Visit's Progress: On track  ?Priority: High  ?Note:   ? ? ? ?Update 11/04/21:  (Status: Goal on Track (progressing): YES.) Long Term Goal  ?Evaluation of current treatment plan related to HF Action Plan and patient's adherence to plan as established by provider ?      Patient knows to follow daily protocol: weigh in am, take meds, self assessment for general overall feeling, any SOB, edema, wt  ?       gain. CURRENT WT IS 220, DENIES SOB, HAS 2+ EDEMA. ?      He knows not to add salt. He knows if he goes into the [place] he needs to call his MD or NP. ?      Praised for his efforts to learn this daily management plan and urged to continue these habits for life. ? ?Current Barriers:  ?Knowledge Deficits related to plan of care for management of CHF  ? ?RNCM Clinical Goal(s):  ?Patient will verbalize basic understanding of CHF disease process and self health management plan as evidenced by Being Able to discuss HF Action Plan and Zones  through collaboration with RN Care manager, provider, and care team.  ? ?  Interventions: ?Inter-disciplinary care team collaboration (see longitudinal plan of care) ?Evaluation of current treatment plan related to  self management and patient's adherence to plan as established by provider ?      Assessed knowledge, new to education regarding HF. Has learned he needs to weigh daily and of course take his medications. He is able to tell me his weight 221#. Weight has actually gone down 5# since his discharge. We discussed the Zones today Green is Good, Yellow call MD and Avoid the Red Zone-prevent a hospitalization. Discussed low salt diet which he is working on, learning more about this. Provided education on reading labels, avoid process foods and fast foods. Needs to get some exercise other than walking around the house. Monitor swelling and respiratory status. Call for increased swelling  ? ?Patient Goals/Self-Care Activities: ?Take medications as prescribed   ?Attend all scheduled provider appointments ? ? ?  ? ?Problem: DIABETES MANAGEMENT   ?Priority: Medium  ?Onset Date: 10/21/2021  ?  ? ?Goal: Patient will make appointments for dental and podiatry assessments over the next 30 days.   ?Start Date: 11/04/2021  ?Expected End Date: 12/10/2021  ?Priority: High  ?Note:   ?Current Barriers:  ?Transportation barriers Advised to call [employer] to arrange for transportation. ? ?RNCM Clinical  Goal(s):  ?Patient will  make appts for dentist and podiatry  through collaboration with RN Care manager, provider, and care team.  ? ?Interventions: ?1:1 collaboration with primary care provider regarding dev

## 2021-11-04 NOTE — Patient Instructions (Signed)
Visit Information ? ?Thank you for taking time to visit with me today. Please don't hesitate to contact me if I can be of assistance to you before our next scheduled telephone appointment. ? ?Following are the goals we discussed today:  ? ?Following is a copy of your care plan:  ?Care Plan : [facility] NP Plan of Care  ?Updates made by [name], NP since 11/04/2021 12:00 AM  ?  ? ?Problem: Heart Failure   ?Priority: High  ?Onset Date: 10/21/2021  ?  ? ?Long-Range Goal: Patient will learn and follow HF Action Plan as evidenced by pt report and no hospitalizations over the next 90 days.   ?Start Date: 10/21/2021  ?Expected End Date: 01/22/2022  ?This Visit's Progress: On track  ?Priority: High  ?Note:   ? ? ? ?Update 11/04/21:  (Status: Goal on Track (progressing): YES.) Long Term Goal  ?Evaluation of current treatment plan related to HF Action Plan and patient's adherence to plan as established by provider ?      Patient knows to follow daily protocol: weigh in am, take meds, self assessment for general overall feeling, any SOB, edema, wt  ?      gain. CURRENT WT IS 220, DENIES SOB, HAS 2+ EDEMA. ?      He knows not to add salt. He knows if he goes into the [place] he needs to call his MD or NP. ?      Praised for his efforts to learn this daily management plan and urged to continue these habits for life. ? ?Current Barriers:  ?Knowledge Deficits related to plan of care for management of CHF  ? ?RNCM Clinical Goal(s):  ?Patient will verbalize basic understanding of CHF disease process and self health management plan as evidenced by Being Able to discuss HF Action Plan and Zones  through collaboration with RN Care manager, provider, and care team.  ? ?Interventions: ?Inter-disciplinary care team collaboration (see longitudinal plan of care) ?Evaluation of current treatment plan related to  self management and patient's adherence to plan as established by provider ?      Assessed knowledge, new to education regarding HF.  Has learned he needs to weigh daily and of course take his medications. He is able to tell me his weight 221#. Weight has actually gone down 5# since his discharge. We discussed the Zones today Green is Good, Yellow call MD and Avoid the Red Zone-prevent a hospitalization. Discussed low salt diet which he is working on, learning more about this. Provided education on reading labels, avoid process foods and fast foods. Needs to get some exercise other than walking around the house. Monitor swelling and respiratory status. Call for increased swelling  ? ?Patient Goals/Self-Care Activities: ?Take medications as prescribed   ?Attend all scheduled provider appointments ? ? ?  ? ?Problem: DIABETES MANAGEMENT   ?Priority: Medium  ?Onset Date: 10/21/2021  ?  ? ?Goal: Patient will make appointments for dental and podiatry assessments over the next 30 days.   ?Start Date: 11/04/2021  ?Expected End Date: 12/10/2021  ?Priority: High  ?Note:   ?Current Barriers:  ?Transportation barriers Advised to call [employer] to arrange for transportation. ? ?RNCM Clinical Goal(s):  ?Patient will  make appts for dentist and podiatry  through collaboration with RN Care manager, provider, and care team.  ? ?Interventions: ?1:1 collaboration with primary care provider regarding development and update of comprehensive plan of care as evidenced by provider attestation and co-signature ?Inter-disciplinary care team collaboration (see  longitudinal plan of care) ?Evaluation of current treatment plan related to  self management and patient's adherence to plan as established by provider ?      Pt hasn't had a dental eval this year. He needs a new dentist, an office without stairs or with an elevator. Pt states he is able to locate a new  ?      provider. He will also make an appt for a foot evaluation to avoid complications of diabetes. ? ?Patient Goals/Self-Care Activities: ?Attend all scheduled provider appointments ? ? ?  ? ?Long-Range Goal: Patient will  learn the ABCs of Diabetes Management as evidenced by discussions and pt outcomes with NP over the next 3 months.   ?Start Date: 10/21/2021  ?Expected End Date: 01/22/2022  ?This Visit's Progress: On track  ?Priority: Medium  ?Note:   ? ?Update 11/04/21:  (Status: Goal on Track (progressing): YES.) Long Term Goal  ?Evaluation of current treatment plan related to Diabetes Management and patient's adherence to plan as established by provider ?      Pt reports that his glucose levels have improved with Lantas and as needed Novolog insulin at meal times although they are not  ?      yet at goal. Today his FBS was 169 this has been the highest FBS he has had this week. They have ranged from 140-169. Pt has  ?      had an AWV, eye exam and is up to date on his vaccinations except for getting the Shingles vaccine. He needs a dental and  ?      podiatry visit. Making those short term goals for him to meet. He is learning about a low carb diet for those with DM. He has  ?      purchased several books to assist him. NP to send him some Carb Counting tools. ? ? ?Current Barriers:  ?Chronic Disease Management support and education needs related to DMII ? ?RNCM Clinical Goal(s):  ?Patient will verbalize basic understanding of DMII disease process and self health management plan as evidenced by pt report and chart review.  through collaboration with Consulting civil engineer, provider, and care team.  ? ?Interventions: ?Inter-disciplinary care team collaboration (see longitudinal plan of care) ?Evaluation of current treatment plan related to  self management and patient's adherence to plan as established by provider ?      Pt has had DM for a number of years but has not concentrated on efforts to manage optimally. Wife is a diabetic also and this is helpful. Pt uses a dexcom 6 for continuous glucose monitoring and checks his values 6 times a day. His provider just changed his insulin from 70/30 to Lantus and they are on a titration schedule to  get him to best control. He needs diet coaching. Ensure he meets his annul wellness needs: AWV, eye exam, dental exam, foot exam, immunizations. ? ?Patient Goals/Self-Care Activities: ?Take medications as prescribed   ?Attend all scheduled provider appointments ? ? ?  ? ? ?The patient verbalized understanding of instructions, educational materials, and care plan provided today and agreed to receive a mailed copy of patient instructions, educational materials, and care plan.  ? ?Telephone follow up appointment with care management team member scheduled for: 12/10/21 ? ?Eulah Pont. Elianny Buxbaum, MSN, GNP-BC ?Gerontological Nurse Practitioner ?[facility] Management ?[phone] ? ? ? ? ?

## 2021-11-06 NOTE — Patient Outreach (Signed)
This call was unsuccessful. Will call back within the week. ? ?Eulah Pont. Shannon Kirkendall, MSN, GNP-BC ?Gerontological Nurse Practitioner ?[facility] Care Management ?[phone] ? ?

## 2021-11-07 ENCOUNTER — Other Ambulatory Visit: Payer: Self-pay | Admitting: Internal Medicine

## 2021-11-08 ENCOUNTER — Telehealth: Payer: Self-pay | Admitting: Internal Medicine

## 2021-11-08 NOTE — Telephone Encounter (Signed)
Medication: hydrALAZINE (APRESOLINE) 100 MG tablet [[id number]]  ENDED Prescribed by the hospitalist.  ? ?Has the patient contacted their pharmacy? NO  ?(Agent: If no, request that the patient contact the pharmacy for the refill. If patient does not wish to contact the pharmacy document the reason why and proceed with request.) ?(Agent: If yes, when and what did the pharmacy advise?) ? ?Preferred Pharmacy (with phone number or street name): [id number] [place] [place] [place]?Phone: [phone]: [phone]?Hours: Not open 24 hours ? ? ?Has the patient been seen for an appointment in the last year OR does the patient have an upcoming appointment? YES 11/07/21 Hospital Follow Up ?Agent: Please be advised that RX refills may take up to 3 business days. We ask that you follow-up with your pharmacy. ?

## 2021-11-09 ENCOUNTER — Other Ambulatory Visit: Payer: Self-pay | Admitting: Internal Medicine

## 2021-11-09 DIAGNOSIS — E785 Hyperlipidemia, unspecified: Secondary | ICD-10-CM

## 2021-11-11 NOTE — Telephone Encounter (Signed)
Requested Prescriptions  ?Pending Prescriptions Disp Refills  ?? atorvastatin (LIPITOR) 40 MG tablet [Pharmacy Med Name: ATORVASTATIN 40 MG TABLET] 90 tablet 2  ?  Sig: *NEED OFFICE VISIT* TAKE 1 TABLET EVERY DAY  ?  ? Cardiovascular:  Antilipid - Statins Failed - 11/09/2021 11:32 AM  ?  ?  Failed - Lipid Panel in normal range within the last 12 months  ?  Cholesterol, Total  ?Date Value Ref Range Status  ?06/17/2021 150 100 - 199 mg/dL Final  ? ?Cholesterol  ?Date Value Ref Range Status  ?09/08/2021 95 0 - 200 mg/dL Final  ? ?LDL Chol Calc (NIH)  ?Date Value Ref Range Status  ?06/17/2021 87 0 - 99 mg/dL Final  ? ?LDL Cholesterol  ?Date Value Ref Range Status  ?09/08/2021 54 0 - 99 mg/dL Final  ?  Comment:  ?         ?Total Cholesterol/HDL:CHD Risk ?Coronary Heart Disease Risk Table ?                    Men   Women ? 1/2 Average Risk   3.4   3.3 ? Average Risk       5.0   4.4 ? 2 X Average Risk   9.6   7.1 ? 3 X Average Risk  23.4   11.0 ?       ?Use the calculated Patient Ratio ?above and the CHD Risk Table ?to determine the patient's CHD Risk. ?       ?ATP III CLASSIFICATION (LDL): ? <100     mg/dL   Optimal ? 100-129  mg/dL   Near or Above ?                   Optimal ? 130-159  mg/dL   Borderline ? 160-189  mg/dL   High ? >190     mg/dL   Very High ?Performed at [facility], [place] [address] [place],  [address] ?  ? ?HDL  ?Date Value Ref Range Status  ?09/08/2021 33 (L) >40 mg/dL Final  ?06/17/2021 35 (L) >39 mg/dL Final  ? ?Triglycerides  ?Date Value Ref Range Status  ?09/08/2021 38 <150 mg/dL Final  ? ?  ?  ?  Passed - Patient is not pregnant  ?  ?  Passed - Valid encounter within last 12 months  ?  Recent Outpatient Visits   ?      ? 3 weeks ago Hospital discharge follow-up  ? [place] Ladell Pier, MD  ? 4 months ago Type 2 diabetes mellitus with diabetic polyneuropathy, with long-term current use of insulin ([place])  ? [place]  Ladell Pier, MD  ? 12 months ago Type 2 diabetes mellitus with diabetic polyneuropathy, with long-term current use of insulin ([place])  ? [place] Ladell Pier, MD  ? 1 year ago Hospital discharge follow-up  ? [place] Ladell Pier, MD  ? 2 years ago Type 2 diabetes mellitus with diabetic polyneuropathy, with long-term current use of insulin ([place])  ? [place] Ladell Pier, MD  ?  ?  ?Future Appointments   ?        ? In 3 weeks Loel Dubonnet, NP [facility]  ? In 1 month Ladell Pier, MD [place]  ?  ? ?  ?  ?  ? ? ?

## 2021-11-11 NOTE — Telephone Encounter (Signed)
Medication refill sent to pharmacy on 11/08/21.  ?

## 2021-11-19 ENCOUNTER — Ambulatory Visit: Payer: 59 | Admitting: Internal Medicine

## 2021-11-20 ENCOUNTER — Encounter: Payer: Self-pay | Admitting: Internal Medicine

## 2021-11-20 NOTE — Progress Notes (Signed)
Patient seen by nephrologist Dr. Johnney Ou 11/06/2021. ?Patient with CKD 3B progressive in setting of CHF and poorly controlled DM.  Blood pressure on that visit was 120/72. ?She thinks patient would benefit from being on an ACE inhibitor but given lack of transportation she was hesitant to start the ACE inhibitor until he can be monitored.  She also thinks he would benefit from SGLT2i but contraindicated as patient had Fournier's in 2015. ?UA reveal albumin/creatinine ratio of 1203. ? ?

## 2021-11-25 ENCOUNTER — Encounter (INDEPENDENT_AMBULATORY_CARE_PROVIDER_SITE_OTHER): Payer: 59 | Admitting: Ophthalmology

## 2021-11-25 ENCOUNTER — Encounter (INDEPENDENT_AMBULATORY_CARE_PROVIDER_SITE_OTHER): Payer: Self-pay

## 2021-12-02 ENCOUNTER — Encounter (INDEPENDENT_AMBULATORY_CARE_PROVIDER_SITE_OTHER): Payer: 59 | Admitting: Ophthalmology

## 2021-12-03 ENCOUNTER — Ambulatory Visit (HOSPITAL_BASED_OUTPATIENT_CLINIC_OR_DEPARTMENT_OTHER): Payer: 59 | Admitting: Family

## 2021-12-03 NOTE — Progress Notes (Deleted)
Office Visit    Patient Name: Derek Lowery Date of Encounter: 12/03/2021  PCP:  Ladell Pier, MD   [place]  Cardiologist:  Skeet Latch, MD  Advanced Practice Provider:  No care team member to display Electrophysiologist:  None      Chief Complaint    Derek Lowery is a 66 y.o. male with a hx of CAD, blindness, essential hypertension, right bundle branch block, DM 2, BPPV, hyperlipidemia, systolic heart failure, anemia, CKD IV presents today for hospital follow-up of heart failure  Past Medical History    Past Medical History:  Diagnosis Date   Arthritis    CAD (coronary artery disease)    NSTEMI 06/2011:  [place] 07/18/11: Proximal diagonal 60%, distal LAD with a diabetic appearance and 60% stenosis, OM2 with an occluded superior branch and an inferior branch with 90%, EF 55% with inferior hypokinesis.  PCI: Promus DES to the OM2 inferior branch.  This vessel provides collaterals to the superior branch which remained occluded.  Echocardiogram 07/18/11: EF 60%, normal wall motion.   Cataract    Essential hypertension, benign    Glaucoma    Hypercholesteremia    Internal hemorrhoids    Noncompliance    Type 2 diabetes mellitus ([place]) 1990   Past Surgical History:  Procedure Laterality Date   CATARACT EXTRACTION Right 2019   Dr. Katy Fitch   CHOLECYSTECTOMY N/A 09/21/2017   Procedure: LAPAROSCOPIC CHOLECYSTECTOMY WITH INTRAOPERATIVE CHOLANGIOGRAM;  Surgeon: Michael Boston, MD;  Location: [facility];  Service: General;  Laterality: N/A;   COLONOSCOPY  2000   Dr. Collene Mares   EYE SURGERY     IRRIGATION AND DEBRIDEMENT ABSCESS Left 05/05/2014   Procedure: IRRIGATION AND DEBRIDEMENT ABSCESS left buttock;  Surgeon: Michael Boston, MD;  Location: [facility];  Service: General;  Laterality: Left;   LEFT HEART CATHETERIZATION WITH CORONARY ANGIOGRAM N/A 07/18/2011   Procedure: LEFT HEART CATHETERIZATION WITH CORONARY ANGIOGRAM;  Surgeon: Hillary Bow, MD;   Location: [facility] CATH LAB;  Service: Cardiovascular;  Laterality: N/A;   PERCUTANEOUS CORONARY STENT INTERVENTION (PCI-S)  07/18/2011   Procedure: PERCUTANEOUS CORONARY STENT INTERVENTION (PCI-S);  Surgeon: Hillary Bow, MD;  Location: [facility] CATH LAB;  Service: Cardiovascular;;    Allergies  Allergies  Allergen Reactions   Coreg [Carvedilol] Other (See Comments)    Redness and swelling of calf   Empagliflozin Other (See Comments)    Four gangrene    History of Present Illness    Derek Lowery is a 66 y.o. male with a hx of CAD, blindness, essential hypertension, right bundle branch block, DM 2, BPPV, hyperlipidemia, systolic heart failure, anemia, CKD IV last seen while hospitalized.  Prior cardiac catheterization in 2012 with DES to the OM 2 with residual 60% diagonal stenosis, distal LAD 60% stenosed.  EF was preserved and he was placed on medical management.  He was lost to follow-up until 2019.  Right bundle branch block was diagnosed at that time an echocardiogram showed normal LVEF and no significant valvular abnormalities.  He was admitted January 2023 with chest pain with work-up revealing HS toponin 141 ? 333 ? 714. BNP increased. Diagnosed with NSTEMI and subsequent echo revealed LVEF 30-35%, wall motion abnormalities, grade 2 diastolic dysfunction, trivial MR, elevated right atrial pressure 8 mmHg.  Kidney function and anemia precluded cardiac catheterization due to risks of bleeding and inability to utilize DAPT.  He presents today for follow-up. ***  EKGs/Labs/Other Studies Reviewed:   The following  studies were reviewed today: ***  EKG:  EKG is ordered today.  The ekg ordered today demonstrates ***  Recent Labs: 09/11/2021: B Natriuretic Peptide 964.9 09/15/2021: Magnesium 2.2 10/18/2021: ALT 13; BUN 39; Creatinine, Ser 1.86; Hemoglobin 10.3; Platelets 150; Potassium 4.1; Sodium 142  Recent Lipid Panel    Component Value Date/Time   CHOL 95 09/08/2021 0500   CHOL 150  06/17/2021 1145   TRIG 38 09/08/2021 0500   HDL 33 (L) 09/08/2021 0500   HDL 35 (L) 06/17/2021 1145   CHOLHDL 2.9 09/08/2021 0500   VLDL 8 09/08/2021 0500   LDLCALC 54 09/08/2021 0500   LDLCALC 87 06/17/2021 1145    Risk Assessment/Calculations:  {Does this patient have ATRIAL FIBRILLATION?[phone]}  Home Medications   No outpatient medications have been marked as taking for the 12/03/21 encounter (Appointment) with Loel Dubonnet, NP.     Review of Systems   ***   All other systems reviewed and are otherwise negative except as noted above.  Physical Exam    VS:  There were no vitals taken for this visit. , BMI There is no height or weight on file to calculate BMI.  Wt Readings from Last 3 Encounters:  10/18/21 229 lb 3.7 oz (104 kg)  09/16/21 229 lb (103.9 kg)  06/17/21 254 lb 3.2 oz (115.3 kg)     GEN: Well nourished, well developed, in no acute distress. HEENT: normal. Neck: Supple, no JVD, carotid bruits, or masses. Cardiac: ***RRR, no murmurs, rubs, or gallops. No clubbing, cyanosis, edema.  ***Radials/PT 2+ and equal bilaterally.  Respiratory:  ***Respirations regular and unlabored, clear to auscultation bilaterally. GI: Soft, nontender, nondistended. MS: No deformity or atrophy. Skin: Warm and dry, no rash. Neuro:  Strength and sensation are intact. Psych: Normal affect.  Assessment & Plan    Systolic heart failure -  CAD -  Anemia -  CKD IV -  DM2 -      Disposition: Follow up {follow up:15908} with Skeet Latch, MD or APP.  Signed, Loel Dubonnet, NP 12/03/2021, 8:34 AM [place]

## 2021-12-05 ENCOUNTER — Encounter (INDEPENDENT_AMBULATORY_CARE_PROVIDER_SITE_OTHER): Payer: 59 | Admitting: Ophthalmology

## 2021-12-05 ENCOUNTER — Encounter (INDEPENDENT_AMBULATORY_CARE_PROVIDER_SITE_OTHER): Payer: Self-pay | Admitting: Ophthalmology

## 2021-12-05 ENCOUNTER — Ambulatory Visit (INDEPENDENT_AMBULATORY_CARE_PROVIDER_SITE_OTHER): Payer: 59 | Admitting: Ophthalmology

## 2021-12-05 DIAGNOSIS — H4051X3 Glaucoma secondary to other eye disorders, right eye, severe stage: Secondary | ICD-10-CM

## 2021-12-05 DIAGNOSIS — E113511 Type 2 diabetes mellitus with proliferative diabetic retinopathy with macular edema, right eye: Secondary | ICD-10-CM

## 2021-12-05 MED ORDER — BEVACIZUMAB 2.5 MG/0.1ML IZ SOSY
2.5000 mg | PREFILLED_SYRINGE | INTRAVITREAL | Status: AC | PRN
  Administered 2021-12-05: 2.5 mg via INTRAVITREAL

## 2021-12-05 NOTE — Assessment & Plan Note (Signed)
Delayed follow-up due to transportation issues delaying to 7 weeks his scheduled appointment company with increased center involved CSME. ? ?This confirms our suspicion that ongoing therapy is required for advanced PDR with CS[employer] OD ?

## 2021-12-05 NOTE — Assessment & Plan Note (Signed)
Chronic active continue to follow ?

## 2021-12-05 NOTE — Progress Notes (Signed)
? ? ?12/05/2021 ? ?  ? ?CHIEF COMPLAINT ?Patient presents for  ?Chief Complaint  ?Patient presents with  ? Diabetic Retinopathy with Macular Edema  ? ? ? ? ?HISTORY OF PRESENT ILLNESS: ?Derek Lowery is a 66 y.o. male who presents to the clinic today for:  ? ?HPI   ?7 week fu OD Oct avastin OD. ?Patient states vision seems to be worsened, a little more blurred since he missed a couple appointments. Denies any new floaters or FOL. ?LBS this morning: 115 before eating. ? ?Last edited by Laurin Coder on 12/05/2021  9:35 AM.  ?  ? ? ?Referring physician: ?Ladell Pier, MD ?[address] ?[address] ?[place],  [place] [address] ? ?HISTORICAL INFORMATION:  ? ?Selected notes from the [place] ?  ? ?Lab Results  ?Component Value Date  ? HGBA1C 7.1 (H) 09/08/2021  ?  ? ?CURRENT MEDICATIONS: ?Current Outpatient Medications (Ophthalmic Drugs)  ?Medication Sig  ? brimonidine (ALPHAGAN) 0.2 % ophthalmic solution Place 1 drop into the right eye 2 (two) times daily.   ? dorzolamide (TRUSOPT) 2 % ophthalmic solution Place 1 drop into the right eye 2 (two) times daily.  ? latanoprost (XALATAN) 0.005 % ophthalmic solution Place 1 drop into the right eye at bedtime.  ? RHOPRESSA 0.02 % SOLN Place 1 drop into the right eye at bedtime.  ? timolol (TIMOPTIC) 0.5 % ophthalmic solution Place 1 drop into the right eye 2 (two) times daily.  ? ?No current facility-administered medications for this visit. (Ophthalmic Drugs)  ? ?Current Outpatient Medications (Other)  ?Medication Sig  ? acetaminophen (TYLENOL) 325 MG tablet Take 2 tablets (650 mg total) by mouth every 6 (six) hours as needed for mild pain (or Fever >/= 101).  ? ammonium lactate (AMLACTIN) 12 % lotion Apply to both feet twice daily for dry skin. (Patient taking differently: 1 application as needed for dry skin. Apply to both feet twice daily for dry skin.)  ? aspirin EC 81 MG tablet Take 1 tablet (81 mg total) by mouth daily.  ? atorvastatin (LIPITOR) 40 MG  tablet TAKE 1 TABLET EVERY DAY  ? BD PEN NEEDLE NANO 2ND GEN 32G X 4 MM MISC USE AS DIRECTED 3 TIMES A DAY. PLEASE SCHEDULE AN APPOINTMENT FOR ADDITIONAL REFILLS.  ? blood glucose meter kit and supplies KIT Dispense based on patient and insurance preference. Use up to four times daily as directed. One Touch Verio  ? carvedilol (COREG) 12.5 MG tablet Take 1 tablet (12.5 mg total) by mouth 2 (two) times daily with a meal.  ? Continuous Blood Gluc Receiver (DEXCOM G6 RECEIVER) DEVI Use to check blood sugar four times daily. E11.42  ? Continuous Blood Gluc Sensor (DEXCOM G6 SENSOR) MISC Use to check blood sugar four times daily. E11.42  ? Continuous Blood Gluc Transmit (DEXCOM G6 TRANSMITTER) MISC Use to check blood sugar four times daily. E11.42  ? glucose monitoring kit (FREESTYLE) monitoring kit 1 each by Does not apply route as needed for other.  ? hydrALAZINE (APRESOLINE) 100 MG tablet TAKE 1 TABLET BY MOUTH 3 TIMES DAILY.  ? insulin glargine (LANTUS SOLOSTAR) 100 UNIT/ML Solostar Pen Inject 10 Units into the skin at bedtime.  ? insulin lispro (HUMALOG) 100 UNIT/ML injection Inject into the skin 4 (four) times daily as needed for high blood sugar (if glucose is >200 take 2-3 units after the meal).  ? isosorbide mononitrate (IMDUR) 60 MG 24 hr tablet Take 1 tablet (60 mg  total) by mouth daily.  ? Lancets MISC Use as directed.  Accu chek 2  ? Multiple Vitamins-Minerals (MULTIVITAMIN WITH MINERALS) tablet Take 1 tablet by mouth daily.  ? torsemide 40 MG TABS Take 40 mg by mouth 2 (two) times daily.  ? ?No current facility-administered medications for this visit. (Other)  ? ? ? ? ?REVIEW OF SYSTEMS: ?ROS   ?Negative for: Constitutional, Gastrointestinal, Neurological, Skin, Genitourinary, Musculoskeletal, HENT, Endocrine, Cardiovascular, Eyes, Respiratory, Psychiatric, Allergic/Imm, Heme/Lymph ?Last edited by Hurman Horn, MD on 12/05/2021 10:18 AM.  ?  ? ? ? ?ALLERGIES ?Allergies  ?Allergen Reactions  ? Coreg  [Carvedilol] Other (See Comments)  ?  Redness and swelling of calf  ? Empagliflozin Other (See Comments)  ?  Four gangrene  ? ? ?PAST MEDICAL HISTORY ?Past Medical History:  ?Diagnosis Date  ? Arthritis   ? CAD (coronary artery disease)   ? NSTEMI 06/2011:  [place] 07/18/11: Proximal diagonal 60%, distal LAD with a diabetic appearance and 60% stenosis, OM2 with an occluded superior branch and an inferior branch with 90%, EF 55% with inferior hypokinesis.  PCI: Promus DES to the OM2 inferior branch.  This vessel provides collaterals to the superior branch which remained occluded.  Echocardiogram 07/18/11: EF 60%, normal wall motion.  ? Cataract   ? Essential hypertension, benign   ? Glaucoma   ? Hypercholesteremia   ? Internal hemorrhoids   ? Noncompliance   ? Type 2 diabetes mellitus ([place]) 1990  ? ?Past Surgical History:  ?Procedure Laterality Date  ? CATARACT EXTRACTION Right 2019  ? Dr. Katy Fitch  ? CHOLECYSTECTOMY N/A 09/21/2017  ? Procedure: LAPAROSCOPIC CHOLECYSTECTOMY WITH INTRAOPERATIVE CHOLANGIOGRAM;  Surgeon: Michael Boston, MD;  Location: [facility];  Service: General;  Laterality: N/A;  ? COLONOSCOPY  2000  ? Dr. Collene Mares  ? EYE SURGERY    ? IRRIGATION AND DEBRIDEMENT ABSCESS Left 05/05/2014  ? Procedure: IRRIGATION AND DEBRIDEMENT ABSCESS left buttock;  Surgeon: Michael Boston, MD;  Location: [facility];  Service: General;  Laterality: Left;  ? LEFT HEART CATHETERIZATION WITH CORONARY ANGIOGRAM N/A 07/18/2011  ? Procedure: LEFT HEART CATHETERIZATION WITH CORONARY ANGIOGRAM;  Surgeon: Hillary Bow, MD;  Location: [facility] C[facility] LAB;  Service: Cardiovascular;  Laterality: N/A;  ? PERCUTANEOUS CORONARY STENT INTERVENTION (PCI-S)  07/18/2011  ? Procedure: PERCUTANEOUS CORONARY STENT INTERVENTION (PCI-S);  Surgeon: Hillary Bow, MD;  Location: [facility] C[facility] LAB;  Service: Cardiovascular;;  ? ? ?FAMILY HISTORY ?Family History  ?Problem Relation Age of Onset  ? Coronary artery disease Father   ?     Developed in his 30s  ? Kidney disease  Father   ? Diabetes Father   ? Melanoma Father   ? Rectal cancer Father   ? Heart failure Mother   ? Hypertension Mother   ? Diabetes Sister   ? Diabetes Brother   ? Colon cancer Neg Hx   ? Esophageal cancer Neg Hx   ? Stomach cancer Neg Hx   ? ? ?SOCIAL HISTORY ?Social History  ? ?Tobacco Use  ? Smoking status: Never  ? Smokeless tobacco: Never  ?Vaping Use  ? Vaping Use: Never used  ?Substance Use Topics  ? Alcohol use: No  ? Drug use: No  ? ?  ? ?  ? ?OPHTHALMIC EXAM: ? ?Base Eye Exam   ? ? Visual Acuity (ETDRS)   ? ?   Right Left  ? Dist [place] 20/60 -2 NLP  ? ?  ?  ? ? Tonometry (Tonopen, 9:36  AM)   ? ?   Right Left  ? Pressure 13 19  ? ?  ?  ? ? Pupils   ? ?   Dark Light Shape APD  ? Right   Irregular None  ? Left 3 3 Round +1  ? ?  ?  ? ? Extraocular Movement   ? ?   Right Left  ?  Full Full  ? ?  ?  ? ? Neuro/Psych   ? ? Oriented x3: Yes  ? Mood/Affect: Normal  ? ?  ?  ? ? Dilation   ? ? Right eye: 1.0% Mydriacyl, 2.5% Phenylephrine @ 9:36 AM  ? ?  ?  ? ?  ? ?Slit Lamp and Fundus Exam   ? ? External Exam   ? ?   Right Left  ? External Normal Normal  ? ?  ?  ? ? Slit Lamp Exam   ? ?   Right Left  ? Lids/Lashes Normal Normal  ? Conjunctiva/Sclera White and quiet, no exposure of tube,cystic conj covers tube well White and quiet  ? Cornea Clear Clear  ? Anterior Chamber ,, Tube 12 meridian Deep  ? Iris NO new NVI large trunks, no signs of new  NVI, updrawn pupil, 3.5 mm, undilated Old neovascularization of the iris 360 degrees, stable nonprogressive  ? Lens Posterior chamber intraocular lens 3.5+ Nuclear sclerosis  ? Anterior Vitreous Normal   ? ?  ?  ? ? Fundus Exam   ? ?   Right Left  ? Posterior Vitreous Posterior vitreous detachment   ? Disc Normal   ? C/D Ratio 0.7   ? Macula Microaneurysms, Retinal pigment epithelial atrophy, Macular thickening, Mild clinically significant macular edema   ? Vessels Proliferative diabetic retinopathy, quiet   ? Periphery  good panretinal photocoagulation, post recent PRP   ? ?   ?  ? ?  ? ? ?IMAGING AND PROCEDURES  ?Imaging and Procedures for 12/05/21 ? ?OCT, Retina - OU - Both Eyes   ? ?   ?Right Eye ?Quality was good. Scan locations included subfoveal. Central Foveal Thickness: 494. Pro

## 2021-12-06 ENCOUNTER — Other Ambulatory Visit: Payer: Self-pay | Admitting: *Deleted

## 2021-12-06 NOTE — Patient Outreach (Signed)
[facility]) Care Management ?Telephonic RN Care Manager Note ? ? ?12/06/2021 ?Name:  Derek Lowery MRN:  [id number] DOB:  03-27-1956 ? ?Summary: ?Coverage for Derek Lowery, [place]- unsuccessful Follow up to patient after a 12/05/21 call to [place] call center for BP [phone] ? ? ? ?Recommendations/Changes made from today's visit: ?[facility] Unsuccessful outreach ?Outreach attempt to the listed at the preferred outreach number in [place]  ?No answer. [facility] RN CM left HIPAA [facility] Portability and Accountability Act) compliant voicemail message along with CM?s contact info.  ? ? ?Subjective: ?Derek Lowery is an 66 y.o. year old male who is a primary patient of Ladell Pier, MD. The care management team was consulted for assistance with care management and/or care coordination needs.   ? ?Telephonic RN Care Manager completed Telephone Visit today.  ? ?Objective: ? ?Medications Reviewed Today   ? ? Reviewed by Hurman Horn, MD (Physician) on 12/05/21 at 1023  Med List Status: <None>  ? ?Medication Order Taking? Sig Documenting Provider Last Dose Status Informant  ?acetaminophen (TYLENOL) 325 MG tablet [id number]  Take 2 tablets (650 mg total) by mouth every 6 (six) hours as needed for mild pain (or Fever >/= 101). Arrien, Jimmy Picket, MD  Active   ?ammonium lactate (AMLACTIN) 12 % lotion [id number] No Apply to both feet twice daily for dry skin.  ?Patient taking differently: 1 application as needed for dry skin. Apply to both feet twice daily for dry skin.  ? Ladell Pier, MD Past Month Active Self  ?aspirin EC 81 MG tablet [id number] No Take 1 tablet (81 mg total) by mouth daily. [place] Donovan, [place] Taking Active Self  ?atorvastatin (LIPITOR) 40 MG tablet [id number]  TAKE 1 TABLET EVERY DAY Ladell Pier, MD  Active   ?BD PEN NEEDLE NANO 2ND GEN 32G X 4 MM MISC [id number] No USE AS DIRECTED 3 TIMES A DAY. PLEASE SCHEDULE AN APPOINTMENT FOR ADDITIONAL REFILLS. Ladell Pier, MD Taking Active Self  ?blood glucose meter kit and supplies KIT [id number] No Dispense based on patient and insurance preference. Use up to four times daily as directed. One Touch Jay Schlichter, Dalbert Batman, MD Taking Active Self  ?brimonidine (ALPHAGAN) 0.2 % ophthalmic solution [id number] No Place 1 drop into the right eye 2 (two) times daily.  [provider] Taking Active Self  ?         ?Med Note Minta Balsam Dec 24, 2020 10:50 PM)    ?carvedilol (COREG) 12.5 MG tablet [id number] No Take 1 tablet (12.5 mg total) by mouth 2 (two) times daily with a meal. Ladell Pier, MD Taking Expired 11/15/21 2359   ?Continuous Blood Gluc Receiver (DEXCOM G6 RECEIVER) DEVI [id number] No Use to check blood sugar four times daily. E11.42 Ladell Pier, MD Taking Active   ?Continuous Blood Gluc Sensor (DEXCOM G6 SENSOR) MISC [id number] No Use to check blood sugar four times daily. E11.42 Ladell Pier, MD Taking Active   ?Continuous Blood Gluc Transmit (DEXCOM G6 TRANSMITTER) MISC [id number] No Use to check blood sugar four times daily. E11.42 Ladell Pier, MD Taking Active   ?dorzolamide (TRUSOPT) 2 % ophthalmic solution [id number] No Place 1 drop into the right eye 2 (two) times daily. [provider] Taking Active Self  ?         ?Med Note Minta Balsam Dec 24, 2020 10:48 PM)    ?glucose monitoring  kit (FREESTYLE) monitoring kit [id number] No 1 each by Does not apply route as needed for other. Ladell Pier, MD Taking Active Self  ?hydrALAZINE (APRESOLINE) 100 MG tablet [id number]  TAKE 1 TABLET BY MOUTH 3 TIMES DAILY. Ladell Pier, MD  Active   ?insulin glargine (LANTUS SOLOSTAR) 100 UNIT/ML Solostar Pen [id number]  Inject 10 Units into the skin at bedtime. Arrien, Jimmy Picket, MD  Active   ?insulin lispro (HUMALOG) 100 UNIT/ML injection [id number]  Inject into the skin 4 (four) times daily as needed for high blood sugar (if glucose is >200 take 2-3  units after the meal). [provider]  Active Self  ?isosorbide mononitrate (IMDUR) 60 MG 24 hr tablet [id number] No Take 1 tablet (60 mg total) by mouth daily. Ladell Pier, MD Taking Expired 11/15/21 2359   ?Lancets MISC [id number] No Use as directed.  Accu chek 2 Ladell Pier, MD Taking Active Self  ?latanoprost (XALATAN) 0.005 % ophthalmic solution [id number] No Place 1 drop into the right eye at bedtime. [provider] Taking Active Self  ?         ?Med Note Lodema Hong, COURTNEY A   Sat Sep 07, 2021  3:27 PM) Patient is not sure if he is using these eye drops.last filled 06/14/21 90DS  ?Multiple Vitamins-Minerals (MULTIVITAMIN WITH MINERALS) tablet [id number] No Take 1 tablet by mouth daily. [provider] Taking Active Self  ?RHOPRESSA 0.02 % SOLN [id number] No Place 1 drop into the right eye at bedtime. [provider] Taking Active Self  ?         ?Med Note Minta Balsam Dec 24, 2020 10:48 PM)    ?timolol (TIMOPTIC) 0.5 % ophthalmic solution [id number] No Place 1 drop into the right eye 2 (two) times daily. [provider] Taking Active Self  ?torsemide 40 MG TABS [id number] No Take 40 mg by mouth 2 (two) times daily. Arrien, Jimmy Picket, MD Taking Active   ? ?  ?  ? ?  ? ? ? ?SDOH:  (Social Determinants of Health) assessments and interventions performed:  ? ? ?Care Plan ? ?Review of patient past medical history, allergies, medications, health status, including review of consultants reports, laboratory and other test data, was performed as part of comprehensive evaluation for care management services.  ? ?There are no care plans that you recently modified to display for this patient. ?  ? ?Plan: RN CM to updated Derek Lowery, [facility] NP via e-mail The patient has been provided with contact information for the care management team and has been advised to call with any health related questions or concerns.  ? ?Daymon Hora L. Lavina Hamman, RN, BSN, CCM ?[place] Management Care Coordinator ?Office number [phone] ?[facility] number [phone] ?Fax number [phone] ? ? ? ? ? ?

## 2021-12-09 ENCOUNTER — Other Ambulatory Visit: Payer: Self-pay | Admitting: *Deleted

## 2021-12-09 NOTE — Patient Outreach (Addendum)
Derek Lowery) Care Management ? ?12/09/2021 ? ?Derek Lowery ?May 13, 1956 ?[id number] ? ?Pt care manager back from [facility] and had message that [name] had called the nurse line for advise on elevated BP of 167/80. NP coverage partner attempted to follow up but her call went unanswered. ? ?NP called [name] at 9:10 a.m. today to follow up but was only able to leave a voice mail and requested a return call.  ? ?[name] returned call when NP was not able to pick up. ? ?NP called pt again at 4:15 and left another message. Pt is on NP schedule for tomorrow. Advised will talk to him in the morning. ? ?Derek Lowery. Derek Billinger, MSN, GNP-BC ?Gerontological Nurse Practitioner ?[facility] Care Management ?[phone] ? ? ?

## 2021-12-10 ENCOUNTER — Other Ambulatory Visit: Payer: Self-pay | Admitting: *Deleted

## 2021-12-10 NOTE — Patient Outreach (Signed)
[facility]) Care Management ? ?12/10/2021 ? ?Derek Lowery ?1956-03-08 ?[id number] ? ?Called pt, unsuccessful, left a message to return NP call. ? ?Eulah Pont. Ashon Rosenberg, MSN, GNP-BC ?Gerontological Nurse Practitioner ?[facility] Care Management ?[phone] ? ?[name] returned my call: ? ?[facility]) Care Management ?Geriatric Nurse Practitioner Note ? ? ?12/10/2021 ?Name:  Derek Lowery MRN:  [id number] DOB:  [birth year] ? ?Summary: ?Having difficulty getting to appts because of transportation issues. He has arranged for [facility] transportion on 4 occasions when they have not shown up. ? ?Recommendations/Changes made from today's visit: ?Reschedule appts. Has transportation to podiatry by sister in law. Waiting for his car to be fixed. Continue self mgmt of chronic problems. ? ?Subjective: ?Derek Lowery is an 66 y.o. year old male who is a primary patient of Ladell Pier, MD. The care management team was consulted for assistance with care management and/or care coordination needs.   ? ?Geriatric Nurse Practitioner completed Telephone Visit today.  ? ?Outpatient Encounter Medications as of 12/10/2021  ?Medication Sig Note  ? acetaminophen (TYLENOL) 325 MG tablet Take 2 tablets (650 mg total) by mouth every 6 (six) hours as needed for mild pain (or Fever >/= 101).   ? ammonium lactate (AMLACTIN) 12 % lotion Apply to both feet twice daily for dry skin. (Patient taking differently: 1 application as needed for dry skin. Apply to both feet twice daily for dry skin.)   ? aspirin EC 81 MG tablet Take 1 tablet (81 mg total) by mouth daily.   ? atorvastatin (LIPITOR) 40 MG tablet TAKE 1 TABLET EVERY DAY   ? BD PEN NEEDLE NANO 2ND GEN 32G X 4 MM MISC USE AS DIRECTED 3 TIMES A DAY. PLEASE SCHEDULE AN APPOINTMENT FOR ADDITIONAL REFILLS.   ? blood glucose meter kit and supplies KIT Dispense based on patient and insurance preference. Use up to four times daily as directed. One Touch Verio   ?  brimonidine (ALPHAGAN) 0.2 % ophthalmic solution Place 1 drop into the right eye 2 (two) times daily.    ? carvedilol (COREG) 12.5 MG tablet Take 1 tablet (12.5 mg total) by mouth 2 (two) times daily with a meal.   ? Continuous Blood Gluc Receiver (DEXCOM G6 RECEIVER) DEVI Use to check blood sugar four times daily. E11.42   ? Continuous Blood Gluc Sensor (DEXCOM G6 SENSOR) MISC Use to check blood sugar four times daily. E11.42   ? Continuous Blood Gluc Transmit (DEXCOM G6 TRANSMITTER) MISC Use to check blood sugar four times daily. E11.42   ? dorzolamide (TRUSOPT) 2 % ophthalmic solution Place 1 drop into the right eye 2 (two) times daily.   ? glucose monitoring kit (FREESTYLE) monitoring kit 1 each by Does not apply route as needed for other.   ? hydrALAZINE (APRESOLINE) 100 MG tablet TAKE 1 TABLET BY MOUTH 3 TIMES DAILY.   ? insulin glargine (LANTUS SOLOSTAR) 100 UNIT/ML Solostar Pen Inject 10 Units into the skin at bedtime.   ? insulin lispro (HUMALOG) 100 UNIT/ML injection Inject into the skin 4 (four) times daily as needed for high blood sugar (if glucose is >200 take 2-3 units after the meal).   ? isosorbide mononitrate (IMDUR) 60 MG 24 hr tablet Take 1 tablet (60 mg total) by mouth daily.   ? Lancets MISC Use as directed.  Accu chek 2   ? latanoprost (XALATAN) 0.005 % ophthalmic solution Place 1 drop into the right eye at bedtime. 09/07/2021: Patient is  not sure if he is using these eye drops.last filled 06/14/21 90DS  ? Multiple Vitamins-Minerals (MULTIVITAMIN WITH MINERALS) tablet Take 1 tablet by mouth daily.   ? RHOPRESSA 0.02 % SOLN Place 1 drop into the right eye at bedtime.   ? timolol (TIMOPTIC) 0.5 % ophthalmic solution Place 1 drop into the right eye 2 (two) times daily.   ? torsemide 40 MG TABS Take 40 mg by mouth 2 (two) times daily.   ? ?No facility-administered encounter medications on file as of 12/10/2021.  ? ?SDOH:  (Social Determinants of Health) assessments and interventions performed:   ? ?Care Plan ? ?Review of patient past medical history, allergies, medications, health status, including review of consultants reports, laboratory and other test data, was performed as part of comprehensive evaluation for care management services.  ? ?Care Plan : [facility] NP Plan of Care  ?Updates made by [name], NP since 12/10/2021 12:00 AM  ?  ? ?Problem: Heart Failure   ?Priority: High  ?Onset Date: 10/21/2021  ?  ? ?Long-Range Goal: Patient will learn and follow HF Action Plan as evidenced by pt report and no hospitalizations over the next 90 days.   ?Start Date: 10/21/2021  ?Expected End Date: 01/22/2022  ?This Visit's Progress: On track  ?Recent Progress: On track  ?Priority: High  ?Note:   ? ? ?Update 12/10/21:  (Status: Goal on track: NO.) Long Term Goal  ?Evaluation of current treatment plan related to HF management  and patient's adherence to plan as established by provider ?      Pt is following Action Plan: weighs daily, takes meds, avoids salt, denies SOB, significant wt gain and edema. He does admit his wt did go up to 229 due to increased fluid intake and it is down now to 224#. His baseline wt was 221#. He has not had to go to the ED. Reinforced his daily self management and to call MD or NP if he goes into the "YELLOW ZONE." ? ? ?Update 11/04/21:  (Status: Goal on Track (progressing): YES.) Long Term Goal  ?Evaluation of current treatment plan related to HF Action Plan and patient's adherence to plan as established by provider ?      Patient knows to follow daily protocol: weigh in am, take meds, self assessment for general overall feeling, any SOB, edema, wt  ?      gain. CURRENT WT IS 220, DENIES SOB, HAS 2+ EDEMA. ?      He knows not to add salt. He knows if he goes into the [place] he needs to call his MD or NP. ?      Praised for his efforts to learn this daily management plan and urged to continue these habits for life. ? ?Current Barriers:  ?Knowledge Deficits related to plan of care for  management of CHF  ? ?RNCM Clinical Goal(s):  ?Patient will verbalize basic understanding of CHF disease process and self health management plan as evidenced by Being Able to discuss HF Action Plan and Zones  through collaboration with RN Care manager, provider, and care team.  ? ?Interventions: ?Inter-disciplinary care team collaboration (see longitudinal plan of care) ?Evaluation of current treatment plan related to  self management and patient's adherence to plan as established by provider ?      Assessed knowledge, new to education regarding HF. Has learned he needs to weigh daily and of course take his medications. He is able to tell me his weight 221#. Weight has actually  gone down 5# since his discharge. We discussed the Zones today Green is Good, Yellow call MD and Avoid the Red Zone-prevent a hospitalization. Discussed low salt diet which he is working on, learning more about this. Provided education on reading labels, avoid process foods and fast foods. Needs to get some exercise other than walking around the house. Monitor swelling and respiratory status. Call for increased swelling  ? ?Patient Goals/Self-Care Activities: ?Take medications as prescribed   ?Attend all scheduled provider appointments ? ? ?  ? ?Problem: DIABETES MANAGEMENT   ?Priority: Medium  ?Onset Date: 10/21/2021  ?  ? ?Long-Range Goal: Patient will learn the ABCs of Diabetes Management as evidenced by discussions and pt outcomes with NP over the next 3 months.   ?Start Date: 10/21/2021  ?Expected End Date: 01/22/2022  ?This Visit's Progress: On track  ?Recent Progress: On track  ?Priority: Medium  ?Note:   ? ?Update 12/10/21:  (Status: Goal on Track (progressing): YES.) Long Term Goal  ?Evaluation of current treatment plan related to DM Management and patient's adherence to plan as established by provider ?      Pt glucose today after a meal was 198. Advised our NFBS goal is <160. He admits that eating too much bread is his down fall. He is  eating a lower carb bread however which is a good choice. His HbgA1C was 7.1 in January this has come down over the last year from 12.0 He is taking his diabetes meds appropriately. He thinks he has received the in

## 2021-12-16 ENCOUNTER — Other Ambulatory Visit: Payer: Self-pay | Admitting: Internal Medicine

## 2021-12-19 ENCOUNTER — Other Ambulatory Visit: Payer: Self-pay | Admitting: Internal Medicine

## 2021-12-19 DIAGNOSIS — E1142 Type 2 diabetes mellitus with diabetic polyneuropathy: Secondary | ICD-10-CM

## 2021-12-19 NOTE — Telephone Encounter (Signed)
Requested medication (s) are due for refill today:   Yes ? ?Requested medication (s) are on the active medication list:   Yes from another provider ? ?Future visit scheduled:   Yes in 1 wk with Dr Wynetta Emery ? ? ?Last ordered: 09/16/2021 15 ml, 0 refills ? ?Returned because this was last prescribed during a hospital admission  ? ?Requested Prescriptions  ?Pending Prescriptions Disp Refills  ? insulin glargine (LANTUS SOLOSTAR) 100 UNIT/ML Solostar Pen 15 mL 0  ?  Sig: Inject 10 Units into the skin at bedtime.  ?  ? Endocrinology:  Diabetes - Insulins Passed - 12/19/2021 12:15 PM  ?  ?  Passed - HBA1C is between 0 and 7.9 and within 180 days  ?  HbA1c, POC (controlled diabetic range)  ?Date Value Ref Range Status  ?06/17/2021 7.1 (A) 0.0 - 7.0 % Final  ? ?Hgb A1c MFr Bld  ?Date Value Ref Range Status  ?09/08/2021 7.1 (H) 4.8 - 5.6 % Final  ?  Comment:  ?  (NOTE) ?        Prediabetes: 5.7 - 6.4 ?        Diabetes: >6.4 ?        Glycemic control for adults with diabetes: <7.0 ?  ?  ?  ?  ?  Passed - Valid encounter within last 6 months  ?  Recent Outpatient Visits   ? ?      ? 2 months ago Hospital discharge follow-up  ? [place] Ladell Pier, MD  ? 6 months ago Type 2 diabetes mellitus with diabetic polyneuropathy, with long-term current use of insulin ([place])  ? [place] Ladell Pier, MD  ? 1 year ago Type 2 diabetes mellitus with diabetic polyneuropathy, with long-term current use of insulin ([place])  ? [place] Ladell Pier, MD  ? 1 year ago Hospital discharge follow-up  ? [place] Ladell Pier, MD  ? 2 years ago Type 2 diabetes mellitus with diabetic polyneuropathy, with long-term current use of insulin ([place])  ? [place] Ladell Pier, MD  ? ?  ?  ?Future Appointments   ? ?        ? In 1 week Ladell Pier, MD [place]  ? ?  ? ? ?  ?  ?  ? ?

## 2021-12-19 NOTE — Telephone Encounter (Signed)
Medication Refill - Medication:  ?insulin glargine (LANTUS SOLOSTAR) 100 UNIT/ML Solostar Pen  ? ?Has the patient contacted their pharmacy? Yes.   ?Contact PCP ? ?Preferred Pharmacy (with phone number or street name):  ?[id number] [address] [id number] ?Phone:  [phone] Fax:  [phone] ? ?Has the patient been seen for an appointment in the last year OR does the patient have an upcoming appointment? Yes.   ? ?Agent: Please be advised that RX refills may take up to 3 business days. We ask that you follow-up with your pharmacy. ?

## 2021-12-20 ENCOUNTER — Other Ambulatory Visit: Payer: Self-pay | Admitting: Pharmacist

## 2021-12-20 DIAGNOSIS — Z794 Long term (current) use of insulin: Secondary | ICD-10-CM

## 2021-12-20 MED ORDER — LANTUS SOLOSTAR 100 UNIT/ML ~~LOC~~ SOPN: 10.0000 [IU] | PEN_INJECTOR | Freq: Every day | SUBCUTANEOUS | 0 refills | Status: DC

## 2021-12-23 ENCOUNTER — Other Ambulatory Visit: Payer: Self-pay | Admitting: Internal Medicine

## 2021-12-23 DIAGNOSIS — I5032 Chronic diastolic (congestive) heart failure: Secondary | ICD-10-CM

## 2021-12-24 NOTE — Telephone Encounter (Signed)
Requested medication (s) are due for refill today: yes ? ?Requested medication (s) are on the active medication list: yes ? ?Last refill:  09/16/21 ? ?Future visit scheduled: no ? ?Notes to clinic:  Unable to refill per protocol, Rx was discontinued 09/16/21. ? ? ?  ?Requested Prescriptions  ?Pending Prescriptions Disp Refills  ? torsemide (DEMADEX) 20 MG tablet [Pharmacy Med Name: TORSEMIDE 20 MG TABLET] 180 tablet 2  ?  Sig: TAKE 2 TABLETS BY MOUTH EVERY DAY  ?  ? Cardiovascular:  Diuretics - Loop Failed - 12/23/2021  2:46 AM  ?  ?  Failed - Cr in normal range and within 180 days  ?  Creat  ?Date Value Ref Range Status  ?09/02/2016 0.80 0.70 - 1.33 mg/dL Final  ?  Comment:  ?    ?For patients > or = 64 years of age: The upper reference limit for ?Creatinine is approximately 13% higher for people identified as ?African-American. ?  ?  ? ?Creatinine, Ser  ?Date Value Ref Range Status  ?10/18/2021 1.86 (H) 0.76 - 1.27 mg/dL Final  ? ?Creatinine, Urine  ?Date Value Ref Range Status  ?06/30/2020 97.16 mg/dL Final  ?  Comment:  ?  Performed at [facility], [place] [address] [place], [place] [address]  ?  ?  ?  ?  Failed - Last BP in normal range  ?  BP Readings from Last 1 Encounters:  ?10/18/21 (!) 159/74  ?  ?  ?  ?  Passed - K in normal range and within 180 days  ?  Potassium  ?Date Value Ref Range Status  ?10/18/2021 4.1 3.5 - 5.2 mmol/L Final  ?  ?  ?  ?  Passed - Ca in normal range and within 180 days  ?  Calcium  ?Date Value Ref Range Status  ?10/18/2021 9.5 8.6 - 10.2 mg/dL Final  ? ?Calcium, Ion  ?Date Value Ref Range Status  ?09/08/2021 1.08 (L) 1.15 - 1.40 mmol/L Final  ?  ?  ?  ?  Passed - Na in normal range and within 180 days  ?  Sodium  ?Date Value Ref Range Status  ?10/18/2021 142 134 - 144 mmol/L Final  ?  ?  ?  ?  Passed - Cl in normal range and within 180 days  ?  Chloride  ?Date Value Ref Range Status  ?10/18/2021 102 96 - 106 mmol/L Final  ?  ?  ?  ?  Passed - Mg Level in normal range and within  180 days  ?  Magnesium  ?Date Value Ref Range Status  ?09/15/2021 2.2 1.7 - 2.4 mg/dL Final  ?  Comment:  ?  Performed at [facility] [address], [place], [place] [address]  ?  ?  ?  ?  Passed - Valid encounter within last 6 months  ?  Recent Outpatient Visits   ? ?      ? 2 months ago Hospital discharge follow-up  ? [place] Ladell Pier, MD  ? 6 months ago Type 2 diabetes mellitus with diabetic polyneuropathy, with long-term current use of insulin ([place])  ? [place] Ladell Pier, MD  ? 1 year ago Type 2 diabetes mellitus with diabetic polyneuropathy, with long-term current use of insulin ([place])  ? [place] Ladell Pier, MD  ? 1 year ago Hospital discharge follow-up  ? [facility]  [place] Karle Plumber B, MD  ? 2 years ago Type 2 diabetes mellitus with diabetic polyneuropathy, with long-term current use of insulin ([place])  ? [place] Ladell Pier, MD  ? ?  ?  ?Future Appointments   ? ?        ? In 2 days Ladell Pier, MD [place]  ? In 2 weeks Minus Breeding, MD [place], [place]  ? ?  ? ? ?  ?  ?  ? ? ?

## 2021-12-26 ENCOUNTER — Ambulatory Visit: Payer: 59 | Admitting: Internal Medicine

## 2022-01-07 ENCOUNTER — Other Ambulatory Visit: Payer: Self-pay | Admitting: Internal Medicine

## 2022-01-07 DIAGNOSIS — Z794 Long term (current) use of insulin: Secondary | ICD-10-CM

## 2022-01-07 NOTE — Telephone Encounter (Signed)
Medication Refill - Medication:  Continuous Blood Gluc Transmit (DEXCOM G6 TRANSMITTER) MISC   Has the patient contacted their pharmacy? Yes.   Contact PCP  Preferred Pharmacy (with phone number or street name):  [id number] [place] [address] [id number] Phone:  [phone] Fax:  [phone] Has the patient been seen for an appointment in the last year OR does the patient have an upcoming appointment? Yes.    Agent: Please be advised that RX refills may take up to 3 business days. We ask that you follow-up with your pharmacy.

## 2022-01-08 MED ORDER — DEXCOM G6 TRANSMITTER MISC: 3 refills | Status: DC

## 2022-01-08 NOTE — Telephone Encounter (Signed)
Requested Prescriptions  Pending Prescriptions Disp Refills  . Continuous Blood Gluc Transmit (DEXCOM G6 TRANSMITTER) MISC 1 each 3    Sig: Use to check blood sugar four times daily. E11.42     Endocrinology: Diabetes - Testing Supplies Passed - 01/07/2022 12:42 PM      Passed - Valid encounter within last 12 months    Recent Outpatient Visits          2 months ago Hospital discharge follow-up   [place] Karle Plumber B, MD   6 months ago Type 2 diabetes mellitus with diabetic polyneuropathy, with long-term current use of insulin [facility])   [place], Deborah B, MD   1 year ago Type 2 diabetes mellitus with diabetic polyneuropathy, with long-term current use of insulin ([place])   [place], Deborah B, MD   1 year ago Hospital discharge follow-up   [place], Deborah B, MD   2 years ago Type 2 diabetes mellitus with diabetic polyneuropathy, with long-term current use of insulin [facility])   [place], Deborah B, MD      Future Appointments            In 2 days Minus Breeding, MD [place], [facility]   In 2 months Ladell Pier, MD [place]

## 2022-01-09 ENCOUNTER — Ambulatory Visit (INDEPENDENT_AMBULATORY_CARE_PROVIDER_SITE_OTHER): Payer: 59 | Admitting: Ophthalmology

## 2022-01-09 ENCOUNTER — Encounter (INDEPENDENT_AMBULATORY_CARE_PROVIDER_SITE_OTHER): Payer: Self-pay | Admitting: Ophthalmology

## 2022-01-09 DIAGNOSIS — I2510 Atherosclerotic heart disease of native coronary artery without angina pectoris: Secondary | ICD-10-CM | POA: Insufficient documentation

## 2022-01-09 DIAGNOSIS — E113511 Type 2 diabetes mellitus with proliferative diabetic retinopathy with macular edema, right eye: Secondary | ICD-10-CM | POA: Diagnosis not present

## 2022-01-09 DIAGNOSIS — H4051X3 Glaucoma secondary to other eye disorders, right eye, severe stage: Secondary | ICD-10-CM | POA: Diagnosis not present

## 2022-01-09 DIAGNOSIS — M7989 Other specified soft tissue disorders: Secondary | ICD-10-CM | POA: Insufficient documentation

## 2022-01-09 MED ORDER — BEVACIZUMAB 2.5 MG/0.1ML IZ SOSY
2.5000 mg | PREFILLED_SYRINGE | INTRAVITREAL | Status: AC | PRN
  Administered 2022-01-09: 2.5 mg via INTRAVITREAL

## 2022-01-09 NOTE — Progress Notes (Signed)
Cardiology Office Note   Date:  01/10/2022   ID:  NAHOME Lowery, DOB 11-Sep-1955, MRN [id number]  PCP:  Derek Pier, MD  Cardiologist:   Derek Latch, MD   Chief Complaint  Patient presents with   Cardiomyopathy      History of Present Illness: Derek Lowery is a 66 y.o. male with a history of coronary artery disease.  I am seeing him because of an abnormal EKG.  I saw him for a video visit in 2020  He has previously been seen by Dr. Lia Lowery in 2013.   He has a history of CAD.   EF was 60 - 65% on echo in February.    In 2012 he had cardiac catheterization with 60% diagonal stenosis, distal LAD 60% stenosis, OM 2 with an occluded superior branch and inferior 90% branch stenosis.  He had DES stenting to the OM 2 inferior branch.  His EF was well-preserved and he was managed medically otherwise.  He was not seen again until last year when he was seen prior to getting gallbladder surgery.  Was seen in the hospital.  He had right bundle branch block.  He had a normal echocardiogram.  There were no other significant abnormalities at that time.  Since he was last seen.  He has been in the hospital 3 times.  He was in there with COVID in November 2021.  He was in with respiratory failure in May 2022 and again in January of this year.  I reviewed these records for this visit.  He had acute respiratory failure with ventilator.  He had pneumonia and pulmonary edema.  He was treated for his reduced ejection fraction.  He has had some issues with nonadherence and difficulty with transportation.  Sounds like he is very much up on his medications.  He seems to be watching his salt.  He is not having any acute shortness of breath, PND or orthopnea.  He has had no new cough fevers or chills.  He is not describing any palpitations, presyncope or syncope.  He has some mild chronic lower extremity edema.  He says his weights have been stable and he does weigh himself.   Past Medical History:   Diagnosis Date   Arthritis    CAD (coronary artery disease)    NSTEMI 06/2011:  LHC 07/18/11: Proximal diagonal 60%, distal LAD with a diabetic appearance and 60% stenosis, OM2 with an occluded superior branch and an inferior branch with 90%, EF 55% with inferior hypokinesis.  PCI: Promus DES to the OM2 inferior branch.  This vessel provides collaterals to the superior branch which remained occluded.  Echocardiogram 07/18/11: EF 60%, normal wall motion.   Cataract    Essential hypertension, benign    Glaucoma    Hypercholesteremia    Internal hemorrhoids    Noncompliance    Type 2 diabetes mellitus ([place]) 1990    Past Surgical History:  Procedure Laterality Date   CATARACT EXTRACTION Right 2019   Dr. Katy Lowery   CHOLECYSTECTOMY N/A 09/21/2017   Procedure: LAPAROSCOPIC CHOLECYSTECTOMY WITH INTRAOPERATIVE CHOLANGIOGRAM;  Surgeon: Derek Boston, MD;  Location: [facility];  Service: General;  Laterality: N/A;   COLONOSCOPY  2000   Dr. Collene Mares   EYE SURGERY     IRRIGATION AND DEBRIDEMENT ABSCESS Left 05/05/2014   Procedure: IRRIGATION AND DEBRIDEMENT ABSCESS left buttock;  Surgeon: Derek Boston, MD;  Location: [facility];  Service: General;  Laterality: Left;   LEFT HEART CATHETERIZATION WITH CORONARY  ANGIOGRAM N/A 07/18/2011   Procedure: LEFT HEART CATHETERIZATION WITH CORONARY ANGIOGRAM;  Surgeon: Derek Bow, MD;  Location: [facility] CATH LAB;  Service: Cardiovascular;  Laterality: N/A;   PERCUTANEOUS CORONARY STENT INTERVENTION (PCI-S)  07/18/2011   Procedure: PERCUTANEOUS CORONARY STENT INTERVENTION (PCI-S);  Surgeon: Derek Bow, MD;  Location: [facility] CATH LAB;  Service: Cardiovascular;;     Current Outpatient Medications  Medication Sig Dispense Refill   acetaminophen (TYLENOL) 325 MG tablet Take 2 tablets (650 mg total) by mouth every 6 (six) hours as needed for mild pain (or Fever >/= 101).     ammonium lactate (AMLACTIN) 12 % lotion Apply to both feet twice daily for dry skin. (Patient taking  differently: 1 application. as needed for dry skin. Apply to both feet twice daily for dry skin.) 400 g 6   aspirin EC 81 MG tablet Take 1 tablet (81 mg total) by mouth daily. 30 tablet 4   atorvastatin (LIPITOR) 40 MG tablet TAKE 1 TABLET EVERY DAY 90 tablet 2   BD PEN NEEDLE NANO 2ND GEN 32G X 4 MM MISC USE AS DIRECTED 3 TIMES A DAY. PLEASE SCHEDULE AN APPOINTMENT FOR ADDITIONAL REFILLS. 100 each 3   blood glucose meter kit and supplies KIT Dispense based on patient and insurance preference. Use up to four times daily as directed. One Touch Verio 1 each 0   brimonidine (ALPHAGAN) 0.2 % ophthalmic solution Place 1 drop into the right eye 2 (two) times daily.      carvedilol (COREG) 12.5 MG tablet TAKE 1 TABLET (12.5MG TOTAL) BY MOUTH TWICE A DAY WITH MEALS 180 tablet 1   Continuous Blood Gluc Receiver (DEXCOM G6 RECEIVER) DEVI Use to check blood sugar four times daily. E11.42 1 each 0   Continuous Blood Gluc Sensor (DEXCOM G6 SENSOR) MISC Use to check blood sugar four times daily. E11.42 3 each 3   Continuous Blood Gluc Transmit (DEXCOM G6 TRANSMITTER) MISC Use to check blood sugar four times daily. E11.42 1 each 3   dorzolamide (TRUSOPT) 2 % ophthalmic solution Place 1 drop into the right eye 2 (two) times daily.     glucose monitoring kit (FREESTYLE) monitoring kit 1 each by Does not apply route as needed for other. 1 each 0   hydrALAZINE (APRESOLINE) 100 MG tablet TAKE 1 TABLET BY MOUTH 3 TIMES DAILY. 90 tablet 2   insulin glargine (LANTUS SOLOSTAR) 100 UNIT/ML Solostar Pen Inject 10 Units into the skin at bedtime. 15 mL 0   insulin lispro (HUMALOG) 100 UNIT/ML injection Inject into the skin 4 (four) times daily as needed for high blood sugar (if glucose is >200 take 2-3 units after the meal).     isosorbide mononitrate (IMDUR) 60 MG 24 hr tablet TAKE 1 TABLET BY MOUTH EVERY DAY 90 tablet 1   Lancets MISC Use as directed.  Accu chek 2 100 each 11   latanoprost (XALATAN) 0.005 % ophthalmic  solution Place 1 drop into the right eye at bedtime.     Multiple Vitamins-Minerals (MULTIVITAMIN WITH MINERALS) tablet Take 1 tablet by mouth daily.     RHOPRESSA 0.02 % SOLN Place 1 drop into the right eye at bedtime.     timolol (TIMOPTIC) 0.5 % ophthalmic solution Place 1 drop into the right eye 2 (two) times daily.     torsemide (DEMADEX) 20 MG tablet TAKE 2 TABLETS (40MG) BY MOUTH IN THE MORNING AND 1 TABLET (20MG) BY MOUTH IN THE EVENING. 270 tablet 1  No current facility-administered medications for this visit.    Allergies:   Empagliflozin    ROS:  Please see the history of present illness.   Otherwise, review of systems are positive for none.   All other systems are reviewed and negative.    PHYSICAL EXAM: VS:  BP 120/70 (BP Location: Left Arm)   Pulse (!) 55   Ht _0  (1.778 m)   Wt 223 lb 9.6 oz (101.4 kg)   SpO2 98%   BMI 32.08 kg/m  , BMI Body mass index is 32.08 kg/m. GENERAL:  Well appearing NECK:  No jugular venous distention, waveform within normal limits, carotid upstroke brisk and symmetric, no bruits, no thyromegaly LYMPHATICS:  No cervical, inguinal adenopathy LUNGS:  Clear to auscultation bilaterally BACK:  No CVA tenderness CHEST:  Unremarkable HEART:  PMI not displaced or sustained,S1 and S2 within normal limits, no S3, no S4, no clicks, no rubs, no murmurs ABD:  Flat, positive bowel sounds normal in frequency in pitch, no bruits, no rebound, no guarding, no midline pulsatile mass, no hepatomegaly, no splenomegaly EXT:  2 plus pulses throughout, moderate bilateral leg edema, no cyanosis no clubbing  Echo 08/2021  1. Mild global hypokinesis with more severe hypokinesis in the  inferolateral, anterolateral, and apical myocardium. Left ventricular  ejection fraction, by estimation, is 30 to 35%. The left ventricle has  moderately decreased function. The left ventricle   demonstrates regional wall motion abnormalities (see scoring  diagram/findings for  description). Left ventricular diastolic parameters  are consistent with Grade II diastolic dysfunction (pseudonormalization).   2. Right ventricular systolic function is mildly reduced. The right  ventricular size is normal. There is normal pulmonary artery systolic  pressure.   3. The mitral valve is normal in structure. Trivial mitral valve  regurgitation. No evidence of mitral stenosis.   4. The aortic valve is tricuspid. Aortic valve regurgitation is not  visualized. No aortic stenosis is present.   5. The inferior vena cava is dilated in size with >50% respiratory  variability, suggesting right atrial pressure of 8 mmHg.   EKG:  EKG is ordered today. The ekg ordered today demonstrates sinus rhythm, rate 55, right bundle branch block, leftward axis, no acute ST-T wave changes.   Recent Labs: 09/11/2021: B Natriuretic Peptide 964.9 09/15/2021: Magnesium 2.2 10/18/2021: ALT 13; BUN 39; Creatinine, Ser 1.86; Hemoglobin 10.3; Platelets 150; Potassium 4.1; Sodium 142    Lipid Panel    Component Value Date/Time   CHOL 95 09/08/2021 0500   CHOL 150 06/17/2021 1145   TRIG 38 09/08/2021 0500   HDL 33 (L) 09/08/2021 0500   HDL 35 (L) 06/17/2021 1145   CHOLHDL 2.9 09/08/2021 0500   VLDL 8 09/08/2021 0500   LDLCALC 54 09/08/2021 0500   LDLCALC 87 06/17/2021 1145      Wt Readings from Last 3 Encounters:  01/10/22 223 lb 9.6 oz (101.4 kg)  10/18/21 229 lb 3.7 oz (104 kg)  09/16/21 229 lb (103.9 kg)      Other studies Reviewed: Additional studies/ records that were reviewed today include: Extensive review of hospital records from multiple hospitalizations. Review of the above records demonstrates:  Please see elsewhere in the note.     ASSESSMENT AND PLAN:    Bilateral lower extremity edema:   This really is baseline.  No change in therapy.  He has some renal insufficiency so we have to be careful about overdiuresis.   Coronary artery disease: He has had no active  chest  discomfort.  No change in therapy.  Essential hypertension: The blood pressure is controlled in the context of managing his cardiomyopathy.  Hyperlipidemia: LDL was 54 and HDL 33.  No change in meds     Diabetes mellitus: A1c was 7.1.  No change in therapy   Cardiomyopathy his ejection fraction is reduced.  This is new from previous.  I need to be high risk for cardiac cath to evaluate his coronaries.  He is not having any angina symptoms.  We will continue medical management.  His renal insufficiency precludes ARB/ARN I.  Creatinine clearance is too low for [place].  His pressure is well controlled on the current meds and I am going to leave these where they are and reassess in about 6 months after he sees [place] with another echocardiogram.  Current medicines are reviewed at length with the patient today.  The patient does not have concerns regarding medicines.  The following changes have been made:  no change  Labs/ tests ordered today include:   Orders Placed This Encounter  Procedures   Hemoglobin U8W   Basic Metabolic Panel (BMET)   EKG 12-Lead     Disposition:   FU with me in six months.     Signed, Minus Breeding, MD  01/10/2022 10:16 AM    [place]

## 2022-01-09 NOTE — Assessment & Plan Note (Signed)
Stable IOP.

## 2022-01-09 NOTE — Progress Notes (Signed)
01/09/2022     CHIEF COMPLAINT Patient presents for  Chief Complaint  Patient presents with   Diabetic Retinopathy with Macular Edema      HISTORY OF PRESENT ILLNESS: Derek Lowery is a 66 y.o. male who presents to the clinic today for:   HPI   5 weeks dilate OD, Avastin OD, OCT. Patient states vision is stable and unchanged since last visit. Denies any new floaters or FOL. "I don't have a transmitter to put in, I have to pick it up today to be able to check my blood sugar." Pt is using latanoprost OD QHS, Rhopressa OD QHS, Brimonidine OD BID, Dorzolamide OD BID, Timolol OD BID Last edited by Laurin Coder on 01/09/2022 10:16 AM.      Referring physician: Ladell Pier, MD [address],  [place] [address]  HISTORICAL INFORMATION:   Selected notes from the MEDICAL RECORD NUMBER    Lab Results  Component Value Date   HGBA1C 7.1 (H) 09/08/2021     CURRENT MEDICATIONS: Current Outpatient Medications (Ophthalmic Drugs)  Medication Sig   brimonidine (ALPHAGAN) 0.2 % ophthalmic solution Place 1 drop into the right eye 2 (two) times daily.    dorzolamide (TRUSOPT) 2 % ophthalmic solution Place 1 drop into the right eye 2 (two) times daily.   latanoprost (XALATAN) 0.005 % ophthalmic solution Place 1 drop into the right eye at bedtime.   RHOPRESSA 0.02 % SOLN Place 1 drop into the right eye at bedtime.   timolol (TIMOPTIC) 0.5 % ophthalmic solution Place 1 drop into the right eye 2 (two) times daily.   No current facility-administered medications for this visit. (Ophthalmic Drugs)   Current Outpatient Medications (Other)  Medication Sig   torsemide (DEMADEX) 20 MG tablet TAKE 2 TABLETS (40MG) BY MOUTH IN THE MORNING AND 1 TABLET (20MG) BY MOUTH IN THE EVENING.   acetaminophen (TYLENOL) 325 MG tablet Take 2 tablets (650 mg total) by mouth every 6 (six) hours as needed for mild pain (or Fever >/= 101).   ammonium lactate (AMLACTIN) 12 % lotion  Apply to both feet twice daily for dry skin. (Patient taking differently: 1 application as needed for dry skin. Apply to both feet twice daily for dry skin.)   aspirin EC 81 MG tablet Take 1 tablet (81 mg total) by mouth daily.   atorvastatin (LIPITOR) 40 MG tablet TAKE 1 TABLET EVERY DAY   BD PEN NEEDLE NANO 2ND GEN 32G X 4 MM MISC USE AS DIRECTED 3 TIMES A DAY. PLEASE SCHEDULE AN APPOINTMENT FOR ADDITIONAL REFILLS.   blood glucose meter kit and supplies KIT Dispense based on patient and insurance preference. Use up to four times daily as directed. One Touch Verio   carvedilol (COREG) 12.5 MG tablet TAKE 1 TABLET (12.5MG TOTAL) BY MOUTH TWICE A DAY WITH MEALS   Continuous Blood Gluc Receiver (DEXCOM G6 RECEIVER) DEVI Use to check blood sugar four times daily. E11.42   Continuous Blood Gluc Sensor (DEXCOM G6 SENSOR) MISC Use to check blood sugar four times daily. E11.42   Continuous Blood Gluc Transmit (DEXCOM G6 TRANSMITTER) MISC Use to check blood sugar four times daily. E11.42   glucose monitoring kit (FREESTYLE) monitoring kit 1 each by Does not apply route as needed for other.   hydrALAZINE (APRESOLINE) 100 MG tablet TAKE 1 TABLET BY MOUTH 3 TIMES DAILY.   hydrALAZINE (APRESOLINE) 50 MG tablet TAKE 1 TABLET BY MOUTH THREE TIMES A DAY (STOP  THE CARVEDILOL)   insulin glargine (LANTUS SOLOSTAR) 100 UNIT/ML Solostar Pen Inject 10 Units into the skin at bedtime.   insulin lispro (HUMALOG) 100 UNIT/ML injection Inject into the skin 4 (four) times daily as needed for high blood sugar (if glucose is >200 take 2-3 units after the meal).   isosorbide mononitrate (IMDUR) 60 MG 24 hr tablet TAKE 1 TABLET BY MOUTH EVERY DAY   Lancets MISC Use as directed.  Accu chek 2   Multiple Vitamins-Minerals (MULTIVITAMIN WITH MINERALS) tablet Take 1 tablet by mouth daily.   No current facility-administered medications for this visit. (Other)      REVIEW OF SYSTEMS: ROS   Negative for: Constitutional,  Gastrointestinal, Neurological, Skin, Genitourinary, Musculoskeletal, HENT, Endocrine, Cardiovascular, Eyes, Respiratory, Psychiatric, Allergic/Imm, Heme/Lymph Last edited by Hurman Horn, MD on 01/09/2022 11:12 AM.       ALLERGIES Allergies  Allergen Reactions   Coreg [Carvedilol] Other (See Comments)    Redness and swelling of calf   Empagliflozin Other (See Comments)    Four gangrene    PAST MEDICAL HISTORY Past Medical History:  Diagnosis Date   Arthritis    CAD (coronary artery disease)    NSTEMI 06/2011:  LHC 07/18/11: Proximal diagonal 60%, distal LAD with a diabetic appearance and 60% stenosis, OM2 with an occluded superior branch and an inferior branch with 90%, EF 55% with inferior hypokinesis.  PCI: Promus DES to the OM2 inferior branch.  This vessel provides collaterals to the superior branch which remained occluded.  Echocardiogram 07/18/11: EF 60%, normal wall motion.   Cataract    Essential hypertension, benign    Glaucoma    Hypercholesteremia    Internal hemorrhoids    Noncompliance    Type 2 diabetes mellitus ([place]) 1990   Past Surgical History:  Procedure Laterality Date   CATARACT EXTRACTION Right 2019   Dr. Katy Fitch   CHOLECYSTECTOMY N/A 09/21/2017   Procedure: LAPAROSCOPIC CHOLECYSTECTOMY WITH INTRAOPERATIVE CHOLANGIOGRAM;  Surgeon: Michael Boston, MD;  Location: [facility];  Service: General;  Laterality: N/A;   COLONOSCOPY  2000   Dr. Collene Mares   EYE SURGERY     IRRIGATION AND DEBRIDEMENT ABSCESS Left 05/05/2014   Procedure: IRRIGATION AND DEBRIDEMENT ABSCESS left buttock;  Surgeon: Michael Boston, MD;  Location: [facility];  Service: General;  Laterality: Left;   LEFT HEART CATHETERIZATION WITH CORONARY ANGIOGRAM N/A 07/18/2011   Procedure: LEFT HEART CATHETERIZATION WITH CORONARY ANGIOGRAM;  Surgeon: Hillary Bow, MD;  Location: [facility] CATH LAB;  Service: Cardiovascular;  Laterality: N/A;   PERCUTANEOUS CORONARY STENT INTERVENTION (PCI-S)  07/18/2011   Procedure:  PERCUTANEOUS CORONARY STENT INTERVENTION (PCI-S);  Surgeon: Hillary Bow, MD;  Location: [facility] CATH LAB;  Service: Cardiovascular;;    FAMILY HISTORY Family History  Problem Relation Age of Onset   Coronary artery disease Father        Developed in his 53s   Kidney disease Father    Diabetes Father    Melanoma Father    Rectal cancer Father    Heart failure Mother    Hypertension Mother    Diabetes Sister    Diabetes Brother    Colon cancer Neg Hx    Esophageal cancer Neg Hx    Stomach cancer Neg Hx     SOCIAL HISTORY Social History   Tobacco Use   Smoking status: Never   Smokeless tobacco: Never  Vaping Use   Vaping Use: Never used  Substance Use Topics   Alcohol use: No  Drug use: No         OPHTHALMIC EXAM:  Base Eye Exam     Visual Acuity (ETDRS)       Right Left   Dist [place] 20/25 +2 NLP         Tonometry (Tonopen, 10:19 AM)       Right Left   Pressure 10 17         Pupils       Dark Light Shape APD   Right   Irregular None   Left 3 3 Round +1         Visual Fields       Left Right     Full   Restrictions Total superior temporal, inferior temporal, superior nasal, inferior nasal deficiencies          Extraocular Movement       Right Left    Full Full         Neuro/Psych     Oriented x3: Yes   Mood/Affect: Normal         Dilation     Right eye: 1.0% Mydriacyl, 2.5% Phenylephrine @ 10:19 AM           Slit Lamp and Fundus Exam     External Exam       Right Left   External Normal Normal         Slit Lamp Exam       Right Left   Lids/Lashes Normal Normal   Conjunctiva/Sclera White and quiet, no exposure of tube,cystic conj covers tube well White and quiet   Cornea Clear Clear   Anterior Chamber ,, Tube 12 meridian Deep   Iris NO new NVI large trunks, no signs of new  NVI, updrawn pupil, 3.5 mm, undilated Old neovascularization of the iris 360 degrees, stable nonprogressive   Lens Posterior chamber  intraocular lens 3.5+ Nuclear sclerosis   Anterior Vitreous Normal          Fundus Exam       Right Left   Posterior Vitreous Posterior vitreous detachment    Disc Normal    C/D Ratio 0.7    Macula Microaneurysms, Retinal pigment epithelial atrophy, Macular thickening, no clinically significant macular edema    Vessels Proliferative diabetic retinopathy, quiet    Periphery  good panretinal photocoagulation, post recent PRP             IMAGING AND PROCEDURES  Imaging and Procedures for 01/09/22  Intravitreal Injection, Pharmacologic Agent - OD - Right Eye       Time Out 01/09/2022. 11:13 AM. Confirmed correct patient, procedure, site, and patient consented.   Anesthesia Topical anesthesia was used. Anesthetic medications included Lidocaine 4%.   Procedure Preparation included Tobramycin 0.3%, 10% betadine to eyelids, 5% betadine to ocular surface. A 30 gauge needle was used.   Injection: 2.5 mg bevacizumab 2.5 MG/0.1ML   Route: Intravitreal, Site: Right Eye   NDC: [phone], Lot: [id number], Expiration date: 03/16/2022   Post-op Post injection exam found visual acuity of at least counting fingers. The patient tolerated the procedure well. There were no complications. The patient received written and verbal post procedure care education. Post injection medications included ocuflox.      OCT, Retina - OU - Both Eyes       Right Eye Quality was good. Scan locations included subfoveal. Central Foveal Thickness: 273. Progression has been stable. Findings include cystoid macular edema.   Notes Chronic CSME, stable overall with increase  in CSME, currently at 5-week interval.  Thus proving this patient does require ongoing chronic suppressive therapy             ASSESSMENT/PLAN:  Proliferative diabetic retinopathy of right eye with macular edema associated with type 2 diabetes mellitus ([place]) Recent recurrence of CSME centrally OD, April 2023 now vastly improved  at shorter interval follow-up 5 weeks.  We will repeat injection today and this monocular patient to maintain and follow-up again in 5 to 6 weeks  Neovascular glaucoma, right eye, severe stage Stable IOP.     ICD-10-CM   1. Proliferative diabetic retinopathy of right eye with macular edema associated with type 2 diabetes mellitus (HCC)  E11.3511 Intravitreal Injection, Pharmacologic Agent - OD - Right Eye    OCT, Retina - OU - Both Eyes    bevacizumab (AVASTIN) SOSY 2.5 mg    2. Neovascular glaucoma, right eye, severe stage  H40.51X3       1.  OD center involved CSME improved now shorter interval follow-up post antivegF therapy last injection April 2023.  Repeat injection today and reevaluate next in 5 weeks  2.  3.  Ophthalmic Meds Ordered this visit:  Meds ordered this encounter  Medications   bevacizumab (AVASTIN) SOSY 2.5 mg       Return in about 5 weeks (around 02/13/2022) for dilate, OD, AVASTIN OCT.  There are no Patient Instructions on file for this visit.   Explained the diagnoses, plan, and follow up with the patient and they expressed understanding.  Patient expressed understanding of the importance of proper follow up care.   Clent Demark Jaala Bohle M.D. Diseases & Surgery of the Retina and Vitreous [facility] [place] 01/09/22     Abbreviations: M myopia (nearsighted); A astigmatism; H hyperopia (farsighted); P presbyopia; Mrx spectacle prescription;  CTL contact lenses; OD right eye; OS left eye; OU both eyes  XT exotropia; ET esotropia; PEK punctate epithelial keratitis; PEE punctate epithelial erosions; DES dry eye syndrome; MGD meibomian gland dysfunction; ATs artificial tears; PFAT's preservative free artificial tears; [place] nuclear sclerotic cataract; PSC posterior subcapsular cataract; ERM epi-retinal membrane; PVD posterior vitreous detachment; RD retinal detachment; DM diabetes mellitus; DR diabetic retinopathy; NPDR non-proliferative diabetic  retinopathy; PDR proliferative diabetic retinopathy; CSME clinically significant macular edema; DME diabetic macular edema; dbh dot blot hemorrhages; CWS cotton wool spot; POAG primary open angle glaucoma; C/D cup-to-disc ratio; HVF humphrey visual field; GVF goldmann visual field; OCT optical coherence tomography; IOP intraocular pressure; BRVO Branch retinal vein occlusion; CRVO central retinal vein occlusion; CRAO central retinal artery occlusion; BRAO branch retinal artery occlusion; RT retinal tear; SB scleral buckle; PPV pars plana vitrectomy; VH Vitreous hemorrhage; PRP panretinal laser photocoagulation; IVK intravitreal kenalog; VMT vitreomacular traction; MH Macular hole;  NVD neovascularization of the disc; NVE neovascularization elsewhere; AREDS age related eye disease study; ARMD age related macular degeneration; POAG primary open angle glaucoma; EBMD epithelial/anterior basement membrane dystrophy; ACIOL anterior chamber intraocular lens; IOL intraocular lens; PCIOL posterior chamber intraocular lens; Phaco/IOL phacoemulsification with intraocular lens placement; [place] photorefractive keratectomy; LASIK laser assisted in situ keratomileusis; HTN hypertension; DM diabetes mellitus; COPD chronic obstructive pulmonary disease

## 2022-01-09 NOTE — Assessment & Plan Note (Signed)
Recent recurrence of CSME centrally OD, April 2023 now vastly improved at shorter interval follow-up 5 weeks.  We will repeat injection today and this monocular patient to maintain and follow-up again in 5 to 6 weeks

## 2022-01-10 ENCOUNTER — Ambulatory Visit (INDEPENDENT_AMBULATORY_CARE_PROVIDER_SITE_OTHER): Payer: 59 | Admitting: Cardiology

## 2022-01-10 ENCOUNTER — Encounter: Payer: Self-pay | Admitting: Cardiology

## 2022-01-10 ENCOUNTER — Telehealth: Payer: Self-pay

## 2022-01-10 DIAGNOSIS — I2510 Atherosclerotic heart disease of native coronary artery without angina pectoris: Secondary | ICD-10-CM

## 2022-01-10 DIAGNOSIS — M7989 Other specified soft tissue disorders: Secondary | ICD-10-CM

## 2022-01-10 DIAGNOSIS — E118 Type 2 diabetes mellitus with unspecified complications: Secondary | ICD-10-CM

## 2022-01-10 DIAGNOSIS — E785 Hyperlipidemia, unspecified: Secondary | ICD-10-CM

## 2022-01-10 DIAGNOSIS — I10 Essential (primary) hypertension: Secondary | ICD-10-CM

## 2022-01-10 LAB — BASIC METABOLIC PANEL
BUN/Creatinine Ratio: 26 — ABNORMAL HIGH (ref 10–24)
BUN: 49 mg/dL — ABNORMAL HIGH (ref 8–27)
CO2: 31 mmol/L — ABNORMAL HIGH (ref 20–29)
Calcium: 9.1 mg/dL (ref 8.6–10.2)
Chloride: 103 mmol/L (ref 96–106)
Creatinine, Ser: 1.86 mg/dL — ABNORMAL HIGH (ref 0.76–1.27)
Glucose: 149 mg/dL — ABNORMAL HIGH (ref 70–99)
Potassium: 3.9 mmol/L (ref 3.5–5.2)
Sodium: 145 mmol/L — ABNORMAL HIGH (ref 134–144)
eGFR: 40 mL/min/{1.73_m2} — ABNORMAL LOW (ref >59)

## 2022-01-10 LAB — HEMOGLOBIN A1C
Est. average glucose Bld gHb Est-mCnc: 140 mg/dL
Hgb A1c MFr Bld: 6.5 % — ABNORMAL HIGH (ref 4.8–5.6)

## 2022-01-10 NOTE — Telephone Encounter (Signed)
Called to see if pt was coming to his appt. He states he is waiting for his brother to get there, but he is coming.

## 2022-01-10 NOTE — Patient Instructions (Signed)
Medication Instructions:  Your physician recommends that you continue on your current medications as directed. Please refer to the Current Medication list given to you today.  *If you need a refill on your cardiac medications before your next appointment, please call your pharmacy*   Lab Work: Your physician recommends that you return for lab work in:  TODAY: HbgA1c, BMET If you have labs (blood work) drawn today and your tests are completely normal, you will receive your results only by: [place] (if you have [place]) OR A paper copy in the mail If you have any lab test that is abnormal or we need to change your treatment, we will call you to review the results.   Testing/Procedures: None   Follow-Up: At [facility], you and your health needs are our priority.  As part of our continuing mission to provide you with exceptional heart care, we have created designated Provider Care Teams.  These Care Teams include your primary Cardiologist (physician) and Advanced Practice Providers (APPs -  Physician Assistants and Nurse Practitioners) who all work together to provide you with the care you need, when you need it.  We recommend signing up for the patient portal called "MyChart".  Sign up information is provided on this After Visit Summary.  MyChart is used to connect with patients for Virtual Visits (Telemedicine).  Patients are able to view lab/test results, encounter notes, upcoming appointments, etc.  Non-urgent messages can be sent to your provider as well.   To learn more about what you can do with MyChart, go to [email or web address].    Your next appointment:   6 month(s)  The format for your next appointment:   In Person  Provider:   Minus Breeding, MD   Other Instructions   Important Information About Sugar

## 2022-01-14 ENCOUNTER — Other Ambulatory Visit: Payer: Self-pay | Admitting: *Deleted

## 2022-01-14 NOTE — Patient Outreach (Signed)
[place] [facility]) Care Management Geriatric Nurse Practitioner Note   01/14/2022 Name:  Derek Lowery MRN:  [id number] DOB:  02-15-1956  Summary: Pt is stable. It's his BIRTHDAY! Improved HF and DM self care.  Recommendations/Changes made from today's visit: Continue learned disease mgmt daily habits. Advise Lowery if a problems arises.  Subjective: Derek Lowery is an 66 y.o. year old male who is a primary patient of Derek Pier, Lowery. The care management team was consulted for assistance with care management and/or care coordination needs.    Geriatric Nurse Practitioner completed Telephone Visit today.   Objective:  Medications Reviewed Today     Reviewed by Derek Lair, NP (Nurse Practitioner) on 01/14/22 at [phone]  Med List Status: <None>   Medication Order Taking? Sig Documenting Provider Last Dose Status Informant  acetaminophen (TYLENOL) 325 MG tablet [id number] No Take 2 tablets (650 mg total) by mouth every 6 (six) hours as needed for mild pain (or Fever >/= 101). Derek Lowery Taking Active   ammonium lactate (AMLACTIN) 12 % lotion [id number] No Apply to both feet twice daily for dry skin.  Patient taking differently: 1 application. as needed for dry skin. Apply to both feet twice daily for dry skin.   Derek Pier, Lowery Taking Active Self  aspirin EC 81 MG tablet [id number] No Take 1 tablet (81 mg total) by mouth daily. Derek Lowery, [place] Taking Active Self  atorvastatin (LIPITOR) 40 MG tablet [id number] No TAKE 1 TABLET EVERY DAY Derek Pier, Lowery Taking Active   BD PEN NEEDLE NANO 2ND GEN 32G X 4 MM MISC [id number] No USE AS DIRECTED 3 TIMES A DAY. PLEASE SCHEDULE AN APPOINTMENT FOR ADDITIONAL REFILLS. Derek Pier, Lowery Taking Active Self  blood glucose meter kit and supplies KIT [id number] No Dispense based on patient and insurance preference. Use up to four times daily as directed. One Touch Derek Lowery  Taking Active Self  brimonidine (ALPHAGAN) 0.2 % ophthalmic solution [id number] No Place 1 drop into the right eye 2 (two) times daily.  Provider, Historical, Lowery Taking Active Self           Med Note Derek Lowery Dec 24, 2020 10:50 PM)    carvedilol (COREG) 12.5 MG tablet [id number] No TAKE 1 TABLET (12.5MG TOTAL) BY MOUTH TWICE A DAY WITH MEALS Derek Pier, Lowery Taking Active   Continuous Blood Gluc Receiver (DEXCOM G6 RECEIVER) DEVI [id number] No Use to check blood sugar four times daily. E11.42 Derek Pier, Lowery Taking Active   Continuous Blood Gluc Sensor (DEXCOM G6 SENSOR) MISC [id number] No Use to check blood sugar four times daily. E11.42 Derek Pier, Lowery Taking Active   Continuous Blood Gluc Transmit (DEXCOM G6 TRANSMITTER) MISC [id number] No Use to check blood sugar four times daily. E11.42 Derek Pier, Lowery Taking Active   dorzolamide (TRUSOPT) 2 % ophthalmic solution [id number] No Place 1 drop into the right eye 2 (two) times daily. Provider, Historical, Lowery Taking Active Self           Med Note Derek Lowery Dec 24, 2020 10:48 PM)    glucose monitoring kit (FREESTYLE) monitoring kit [id number] No 1 each by Does not apply route as needed for other. Derek Pier, Lowery Taking Active Self  hydrALAZINE (APRESOLINE) 100 MG tablet [id number] No TAKE 1 TABLET BY MOUTH 3 TIMES DAILY. Derek Pier, Lowery Taking Active  insulin glargine (LANTUS SOLOSTAR) 100 UNIT/ML Solostar Pen [id number] No Inject 10 Units into the skin at bedtime. Derek Pier, Lowery Taking Active   insulin lispro (HUMALOG) 100 UNIT/ML injection [id number] No Inject into the skin 4 (four) times daily as needed for high blood sugar (if glucose is >200 take 2-3 units after the meal). Provider, Historical, Lowery Taking Active Self  isosorbide mononitrate (IMDUR) 60 MG 24 hr tablet [id number] No TAKE 1 TABLET BY MOUTH EVERY DAY Derek Pier, Lowery Taking Active   Lancets MISC [id number] No Use as  directed.  Accu chek 2 Derek Pier, Lowery Taking Active Self  latanoprost (XALATAN) 0.005 % ophthalmic solution [id number] No Place 1 drop into the right eye at bedtime. Provider, Historical, Lowery Taking Active Self           Med Note Derek Lowery   DXI Sep 07, 2021  3:27 PM) Patient is not sure if he is using these eye drops.last filled 06/14/21 90DS  Multiple Vitamins-Minerals (MULTIVITAMIN WITH MINERALS) tablet [id number] No Take 1 tablet by mouth daily. Provider, Historical, Lowery Taking Active Self  RHOPRESSA 0.02 % SOLN [id number] No Place 1 drop into the right eye at bedtime. Provider, Historical, Lowery Taking Active Self           Med Note Derek Lowery Dec 24, 2020 10:48 PM)    timolol (TIMOPTIC) 0.5 % ophthalmic solution [id number] No Place 1 drop into the right eye 2 (two) times daily. Provider, Historical, Lowery Taking Active Self  torsemide (DEMADEX) 20 MG tablet [id number] No TAKE 2 TABLETS (40MG) BY MOUTH IN THE MORNING AND 1 TABLET (20MG) BY MOUTH IN THE EVENING. Derek Pier, Lowery Taking Active              SDOH:  (Social Determinants of Health) assessments and interventions performed:    Care Plan  Review of patient past medical history, allergies, medications, health status, including review of consultants reports, laboratory and other test data, was performed as part of comprehensive evaluation for care management services.   Care Plan : [facility] NP Plan of Care  Updates made by Derek Lair, NP since 01/14/2022 12:00 AM     Problem: Heart Failure   Priority: High  Onset Date: 10/21/2021     Long-Range Goal: Patient will learn and follow HF Action Plan as evidenced by pt report and no hospitalizations over the next 90 days.   Start Date: 10/21/2021  Expected End Date: 01/22/2022  This Visit's Progress: On track  Recent Progress: On track  Priority: High  Note:    Update 01/14/22:  (Status: Goal Met.) Long Term Goal  Evaluation of current treatment plan  related to HF Management and patient's adherence to plan as established by provider  [name] wt is down to 217# he reports it has been as low as 212#. He is following his HF Action plan faithfully. He has seen his cardiologist. Medications adjusted:   Update 12/10/21:  (Status: Goal on track: NO.) Long Term Goal  Evaluation of current treatment plan related to HF management  and patient's adherence to plan as established by provider       Pt is following Action Plan: weighs daily, takes meds, avoids salt, denies SOB, significant wt gain and edema. He does admit his wt did go up to 229 due to increased fluid intake and it is down now to 224#. His baseline wt was 221#. He has not had to go to  the ED. Reinforced his daily self management and to call Lowery or NP if he goes into the "YELLOW ZONE."   Update 11/04/21:  (Status: Goal on Track (progressing): YES.) Long Term Goal  Evaluation of current treatment plan related to HF Action Plan and patient's adherence to plan as established by provider       Patient knows to follow daily protocol: weigh in am, take meds, self assessment for general overall feeling, any SOB, edema, wt        gain. CURRENT WT IS 220, DENIES SOB, HAS 2+ EDEMA.       He knows not to add salt. He knows if he goes into the [place] he needs to call his Lowery or NP.       Praised for his efforts to learn this daily management plan and urged to continue these habits for life.  Current Barriers:  Knowledge Deficits related to plan of care for management of CHF   RNCM Clinical Goal(s):  Patient will verbalize basic understanding of CHF disease process and self health management plan as evidenced by Being Able to discuss HF Action Plan and Zones  through collaboration with RN Care manager, provider, and care team.   Interventions: Inter-disciplinary care team collaboration (see longitudinal plan of care) Evaluation of current treatment plan related to  self management and patient's  adherence to plan as established by provider       Assessed knowledge, new to education regarding HF. Has learned he needs to weigh daily and of course take his medications. He is able to tell me his weight 221#. Weight has actually gone down 5# since his discharge. We discussed the Zones today Green is Good, Yellow call Lowery and Avoid the Red Zone-prevent a hospitalization. Discussed low salt diet which he is working on, learning more about this. Provided education on reading labels, avoid process foods and fast foods. Needs to get some exercise other than walking around the house. Monitor swelling and respiratory status. Call for increased swelling   Patient Goals/Self-Care Activities: Take medications as prescribed   Attend all scheduled provider appointments      Problem: DIABETES MANAGEMENT   Priority: Medium  Onset Date: 10/21/2021     Goal: Patient will make appointments for dental and podiatry assessments over the next 30 days. (Extending additional 30 days for dental assessment)   Start Date: 12/10/2021  Expected End Date: 02/14/2022  This Visit's Progress: On track  Recent Progress: Not on track  Priority: High  Note:    Update 01/14/22 :  (Status: Goal on Track (progressing): YES.) Short Term Goal  Evaluation of current treatment plan related to [facility] CLOSURES (DENTAL AND PODIATRY ASSESSMENTS)  and patient's adherence to plan as established by provider Pt has completed his podiatry appt. Plans on making his dental appt and having it down by the next time we talk in a month. Encouraged that dental exams should be done twice a year. Podiatry every 3 months.  Update 12/10/21:  (Status: Goal Not Met.) Short Term Goal  Evaluation of current treatment plan related to dental and podiatry appts and patient's adherence to plan as established by provider       Pt made his appts but transportation arranged via [facility] did not occur and pt had to cancel and reschedule his appts. [employer] representative  visited him yesterday and reported she would advise of the transportation problems. Will continue this goal for another month.   Current Barriers:  Transportation barriers Advised  to call [facility] to arrange for transportation.  RNCM Clinical Goal(s):  Patient will  make appts for dentist and podiatry  through collaboration with RN Care manager, provider, and care team.   Interventions: 1:1 collaboration with primary care provider regarding development and update of comprehensive plan of care as evidenced by provider attestation and co-signature Inter-disciplinary care team collaboration (see longitudinal plan of care) Evaluation of current treatment plan related to  self management and patient's adherence to plan as established by provider       Pt hasn't had a dental eval this year. He needs a new dentist, an office without stairs or with an elevator. Pt states he is able to locate a new        provider. He will also make an appt for a foot evaluation to avoid complications of diabetes.  Patient Goals/Self-Care Activities: Attend all scheduled provider appointments      Long-Range Goal: Patient will learn the ABCs of Diabetes Management as evidenced by discussions and pt outcomes with NP over the next 3 months.   Start Date: 10/21/2021  Expected End Date: 01/22/2022  Recent Progress: On track  Priority: Medium  Note:    Update 5./30/23  (Status: Goal on Track (progressing): YES.) Long Term Goal  Evaluation of current treatment plan related to Diabetes management and patient's adherence to plan as established by provider Pt is following his DM plan he has no questions today. He reports his fasting blood sugars are usually in range <120. His NFBS are somewhat elevated. His HgbA1C was improved from 7.2 down to 6.5! We celebrated this milestone. Encouraged his continued compliance and avoidance of complications!  Update 12/10/21:  (Status: Goal on Track (progressing): YES.) Long Term Goal   Evaluation of current treatment plan related to DM Management and patient's adherence to plan as established by provider       Pt glucose today after a meal was 198. Advised our NFBS goal is <160. He admits that eating too much bread is his down fall. He is eating a lower carb bread however which is a good choice. His HbgA1C was 7.1 in January this has come down over the last year from 12.0 He is taking his diabetes meds appropriately. He thinks he has received the information NP sent him about self management but he has to go through his mail. Encouraged him to do so as this information is key for use to discuss to optimize his self care.  Update 11/04/21:  (Status: Goal on Track (progressing): YES.) Long Term Goal  Evaluation of current treatment plan related to Diabetes Management and patient's adherence to plan as established by provider       Pt reports that his glucose levels have improved with Lantas and as needed Novolog insulin at meal times although they are not        yet at goal. Today his FBS was 169 this has been the highest FBS he has had this week. They have ranged from 140-169. Pt has        had an AWV, eye exam and is up to date on his vaccinations except for getting the Shingles vaccine. He needs a dental and        podiatry visit. Making those short term goals for him to meet. He is learning about a low carb diet for those with DM. He has        purchased several books to assist him. NP to send him some Carb Counting  tools.   Current Barriers:  Chronic Disease Management support and education needs related to DMII  RNCM Clinical Goal(s):  Patient will verbalize basic understanding of DMII disease process and self health management plan as evidenced by pt report and chart review.  through collaboration with RN Care manager, provider, and care team.   Interventions: Inter-disciplinary care team collaboration (see longitudinal plan of care) Evaluation of current treatment plan  related to  self management and patient's adherence to plan as established by provider       Pt has had DM for a number of years but has not concentrated on efforts to manage optimally. Wife is a diabetic also and this is helpful. Pt uses a dexcom 6 for continuous glucose monitoring and checks his values 6 times a day. His provider just changed his insulin from 70/30 to Lantus and they are on a titration schedule to get him to best control. He needs diet coaching. Ensure he meets his annul wellness needs: AWV, eye exam, dental exam, foot exam, immunizations.  Patient Goals/Self-Care Activities: Take medications as prescribed   Attend all scheduled provider appointments        Plan: Telephone follow up appointment with care management team member scheduled for:  One month. Pt agreed to plan of care.  Eulah Pont. Myrtie Neither, MSN, [facility] Gerontological Nurse Practitioner [facility] Care Management [phone]

## 2022-01-15 ENCOUNTER — Other Ambulatory Visit: Payer: Self-pay | Admitting: Internal Medicine

## 2022-01-15 ENCOUNTER — Telehealth: Payer: Self-pay | Admitting: Internal Medicine

## 2022-01-15 DIAGNOSIS — Z794 Long term (current) use of insulin: Secondary | ICD-10-CM

## 2022-01-15 NOTE — Telephone Encounter (Signed)
Will forward to provider  

## 2022-01-15 NOTE — Telephone Encounter (Signed)
Phone call placed to patient this morning after I received a request for refill on Humalog.  He had also requested refill on Lantus and hydralazine.  According to my last office note, Humalog was discontinued post last hospitalization.  Patient states that he requested the Humalog in error. Reports he is not taking and does not need the Humalog. I told him I will disregard and deny the request then.

## 2022-01-16 ENCOUNTER — Encounter: Payer: Self-pay | Admitting: *Deleted

## 2022-01-22 ENCOUNTER — Other Ambulatory Visit: Payer: Self-pay | Admitting: Internal Medicine

## 2022-01-22 DIAGNOSIS — E118 Type 2 diabetes mellitus with unspecified complications: Secondary | ICD-10-CM

## 2022-01-22 IMAGING — DX DG CHEST 1V PORT
1 series · 1 of 1 positions shown · non-contrast
Comparison: 09/07/2021.

CLINICAL DATA: Shortness of breath.

EXAM:
PORTABLE CHEST 1 VIEW

[chest]
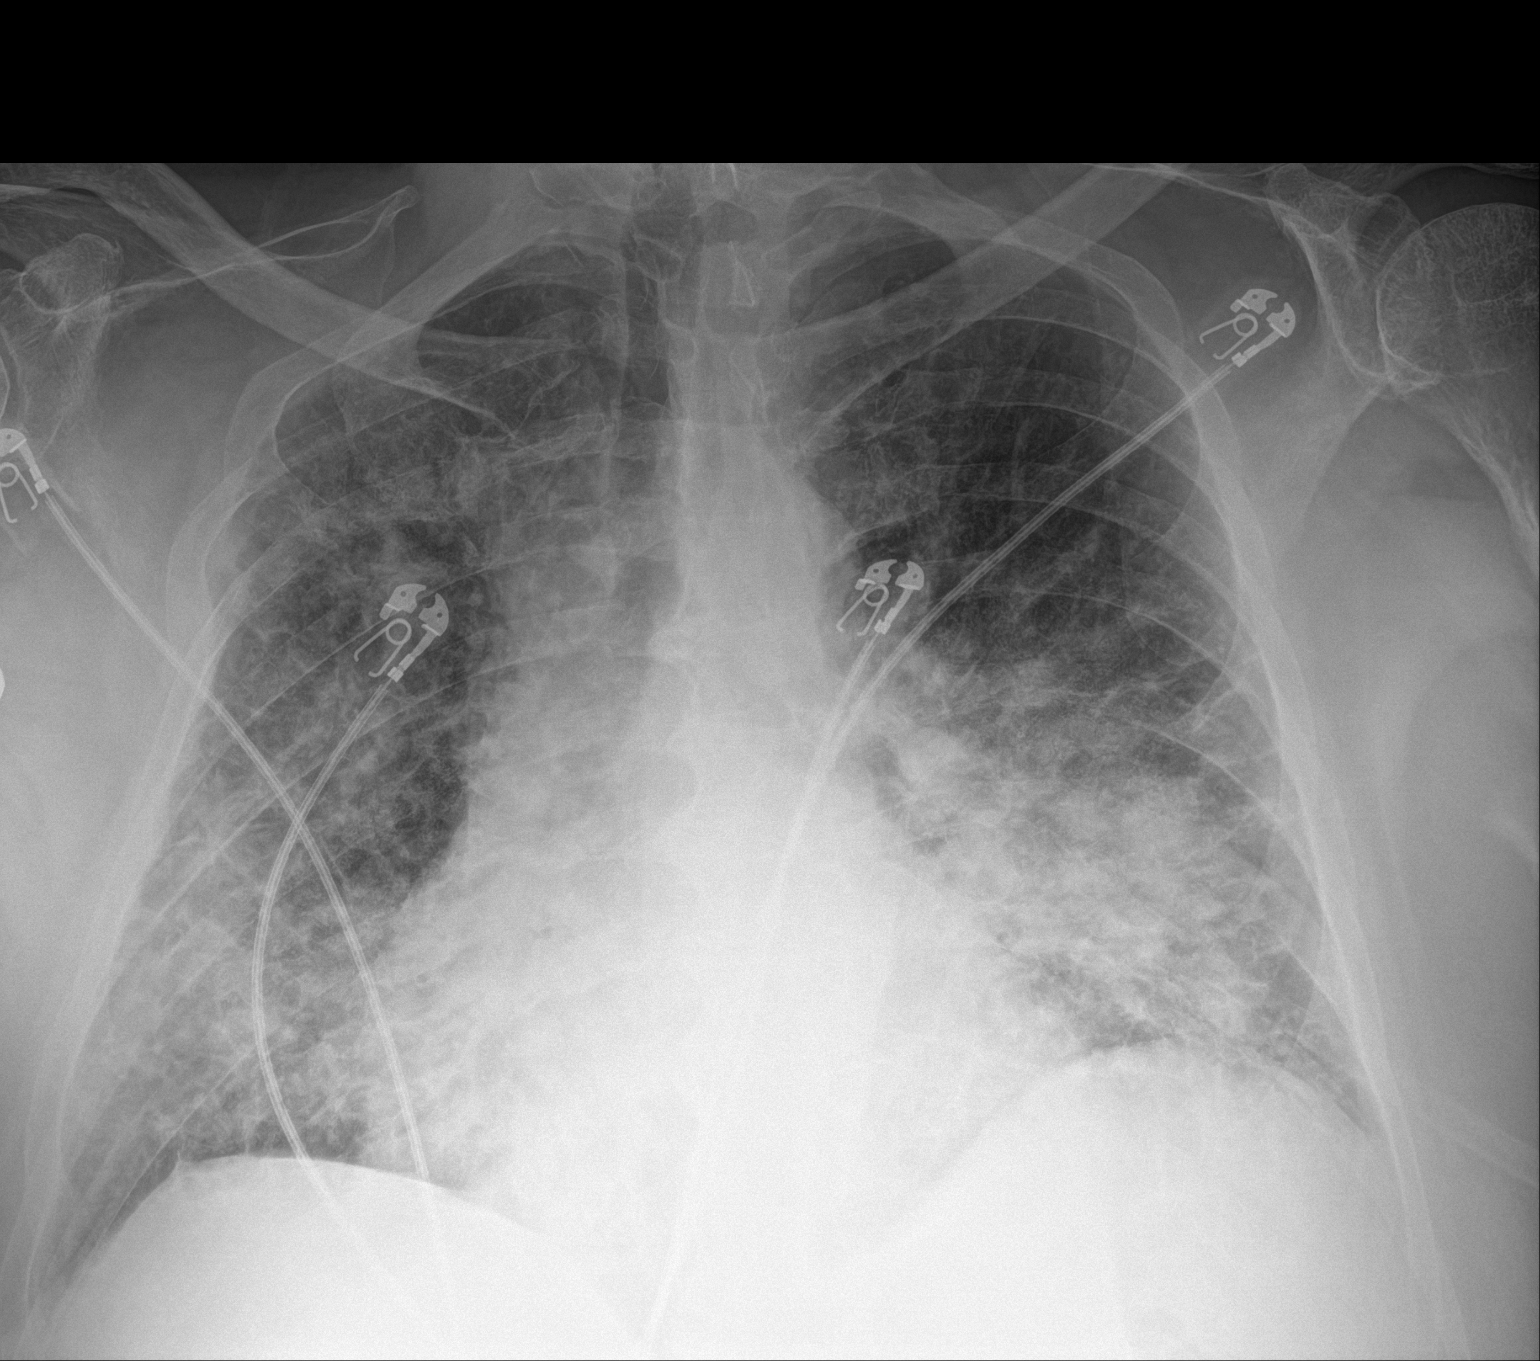

[1 of 1 positions shown; findings below may reference images not displayed]

FINDINGS: The heart is mildly enlarged and the mediastinal contours are within
normal limits. Interstitial and airspace opacities are noted in the
lungs bilaterally, increased from the prior exam. No effusion or
pneumothorax. No acute osseous abnormality
IMPRESSION: Interstitial and airspace opacities in the lungs bilaterally,
increased from the prior exam.

## 2022-01-23 IMAGING — DX DG CHEST 1V PORT
1 series · 1 of 1 positions shown · non-contrast
Comparison: September 08, 2021.

CLINICAL DATA: Respiratory complication.

EXAM:
PORTABLE CHEST 1 VIEW

[chest]
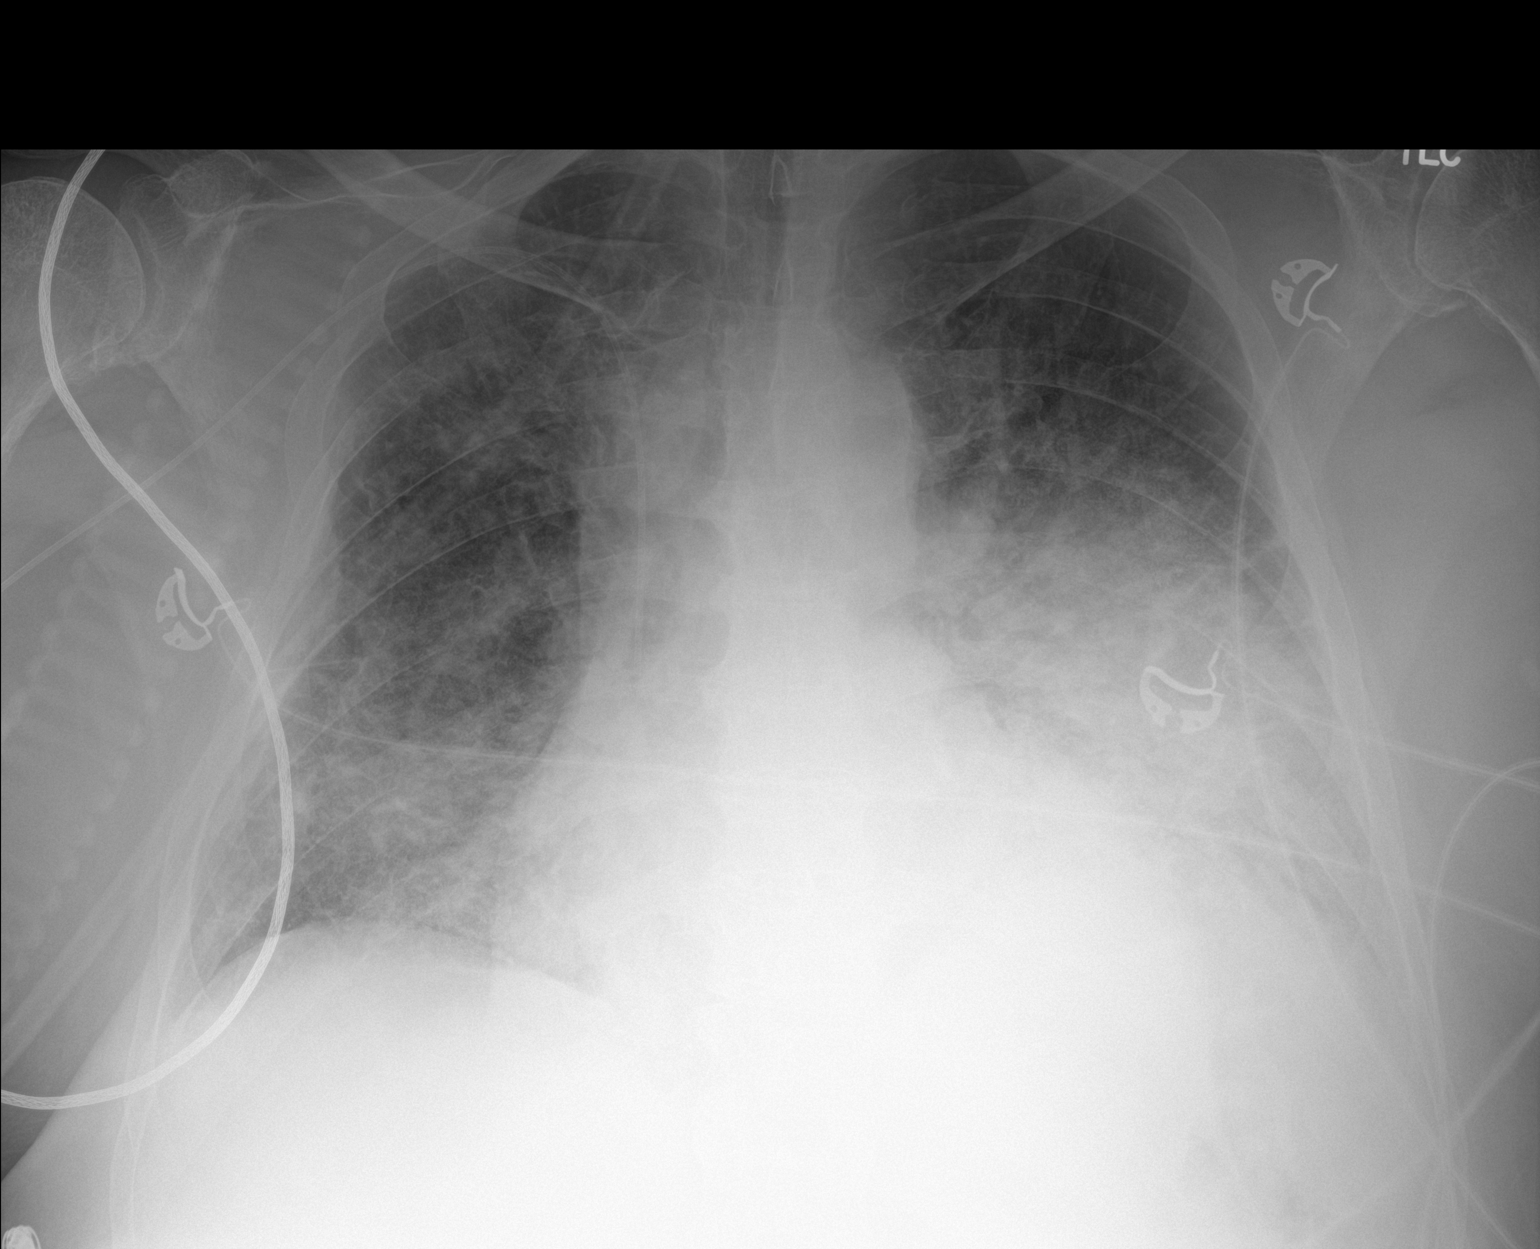

[1 of 1 positions shown; findings below may reference images not displayed]

FINDINGS: Stable cardiomegaly. Stable bilateral lung opacities are noted, left
greater than right, consistent with pneumonia and possible edema is
well. Interval placement of right-sided PICC line with distal tip in
expected position of the SVC. Bony thorax is unremarkable.
IMPRESSION: Stable bilateral lung opacities as described above. Interval
placement of right-sided PICC line.

## 2022-01-23 NOTE — Telephone Encounter (Signed)
Requested Prescriptions  Pending Prescriptions Disp Refills  . BD PEN NEEDLE NANO 2ND GEN 32G X 4 MM MISC [Pharmacy Med Name: BD NANO 2 GEN PEN NDL 32G 4MM] 100 each 3    Sig: USE AS DIRECTED 3 TIMES A DAY. PLEASE SCHEDULE AN APPOINTMENT FOR ADDITIONAL REFILLS.     Endocrinology: Diabetes - Testing Supplies Passed - 01/22/2022 11:40 PM      Passed - Valid encounter within last 12 months    Recent Outpatient Visits          3 months ago Hospital discharge follow-up   [place], Deborah B, MD   7 months ago Type 2 diabetes mellitus with diabetic polyneuropathy, with long-term current use of insulin [facility])   [place], Deborah B, MD   1 year ago Type 2 diabetes mellitus with diabetic polyneuropathy, with long-term current use of insulin ([facility])   [place], Deborah B, MD   1 year ago Hospital discharge follow-up   [place], Deborah B, MD   2 years ago Type 2 diabetes mellitus with diabetic polyneuropathy, with long-term current use of insulin [facility])   [place], MD      Future Appointments            In 2 months Wynetta Emery Dalbert Batman, MD [place]   In 5 months [place], Jeneen Rinks, MD [facility] [place], [place]

## 2022-01-24 IMAGING — US US RENAL
1 series · 14 of 25 positions shown · non-contrast
Comparison: Prior renal imaging from Thursday June, 2020 and CT from
Monday September, 2017

CLINICAL DATA: A 65-year-old male presents with acute kidney
injury.

EXAM:
RENAL / URINARY TRACT ULTRASOUND COMPLETE

[Series 1: us renal · 14 of 28 slices shown]
[im 1/28]
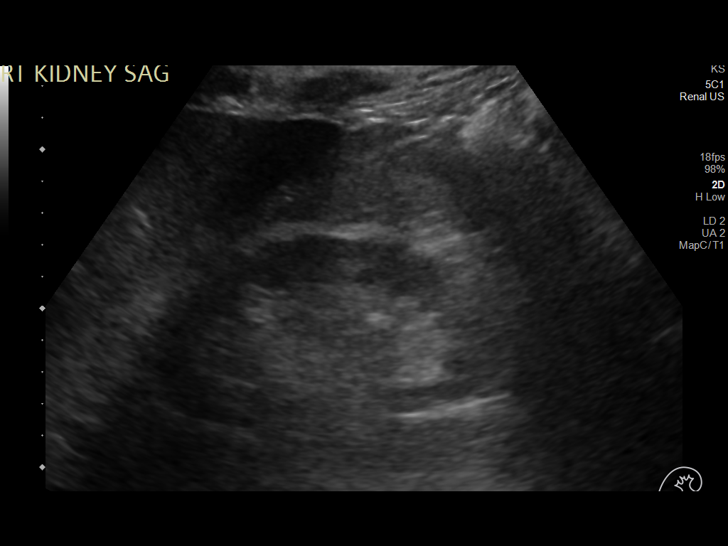
[im 3/28]
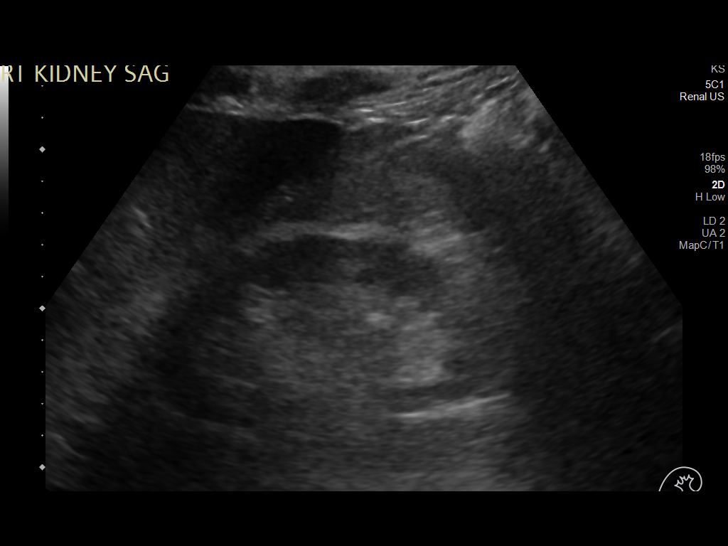
[im 5/28]
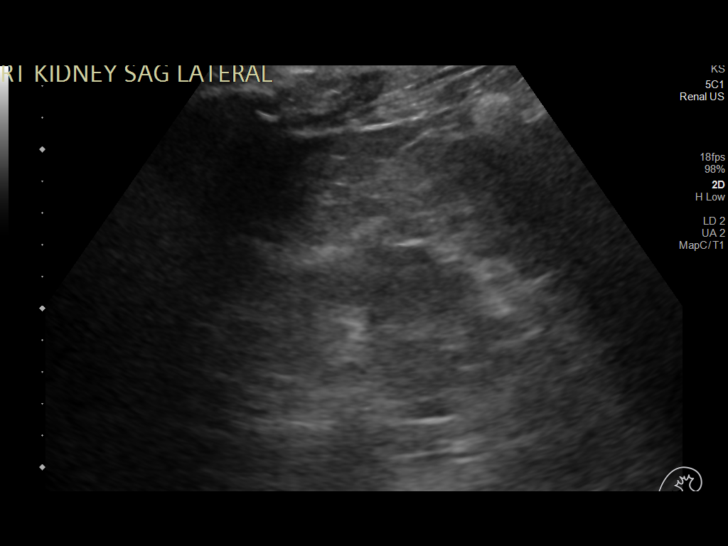
[im 7/28]
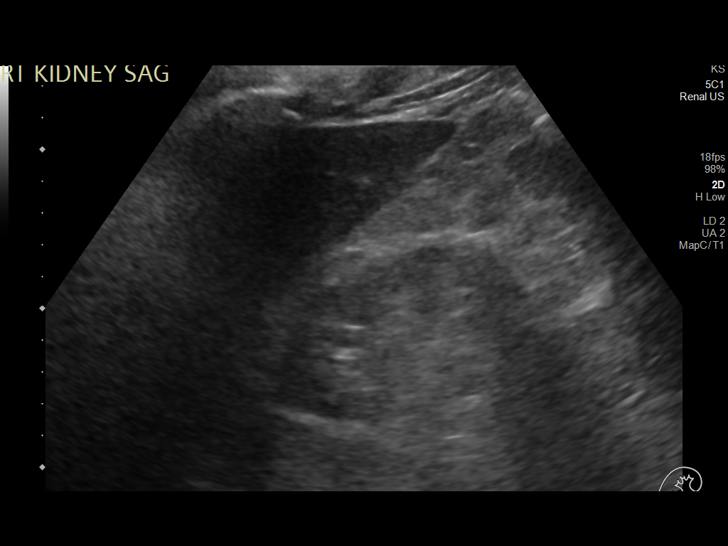
[im 10/28]
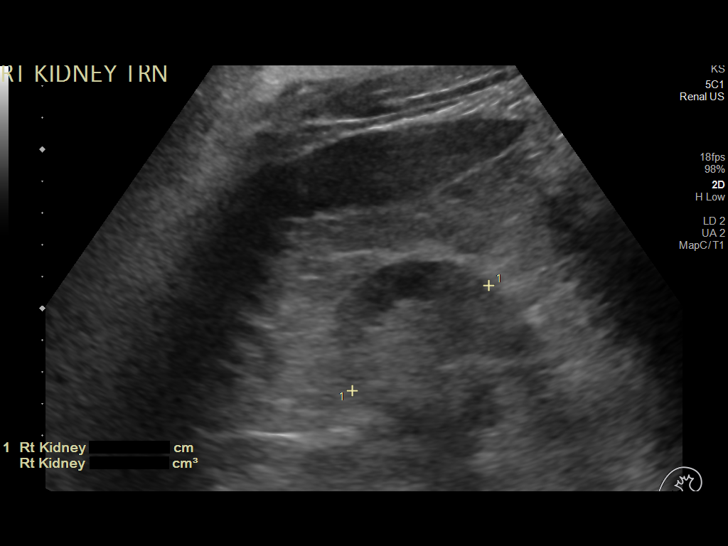
[im 11/28]
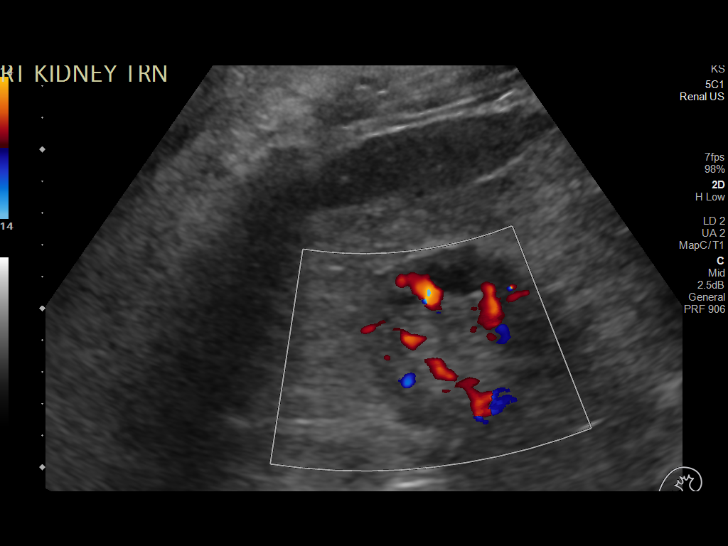
[im 13/28]
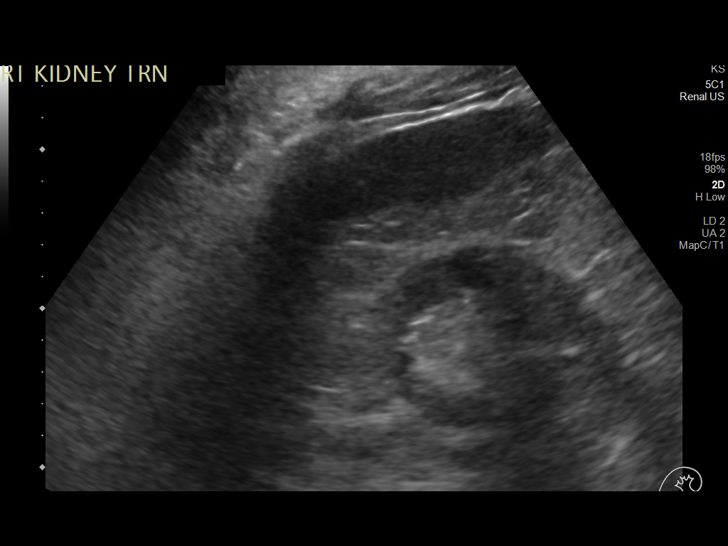
[im 15/28]
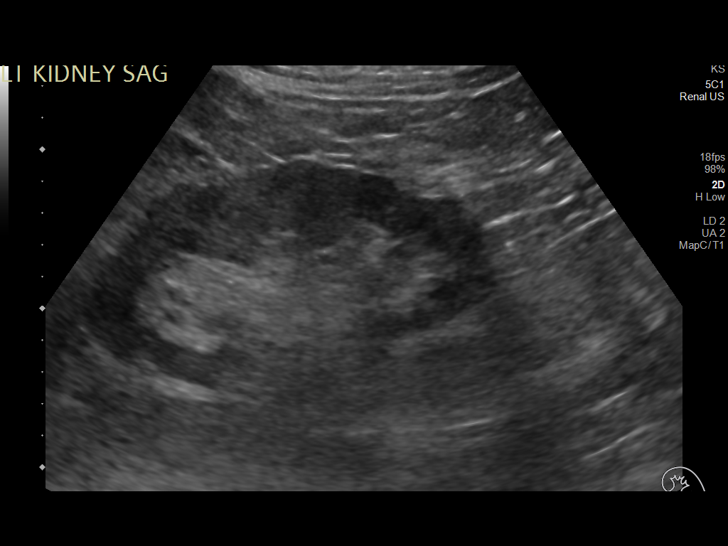
[im 17/28]
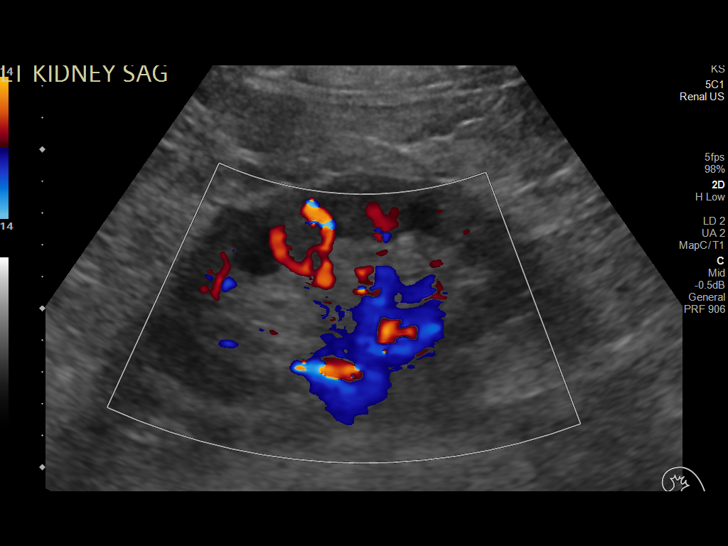
[im 19/28]
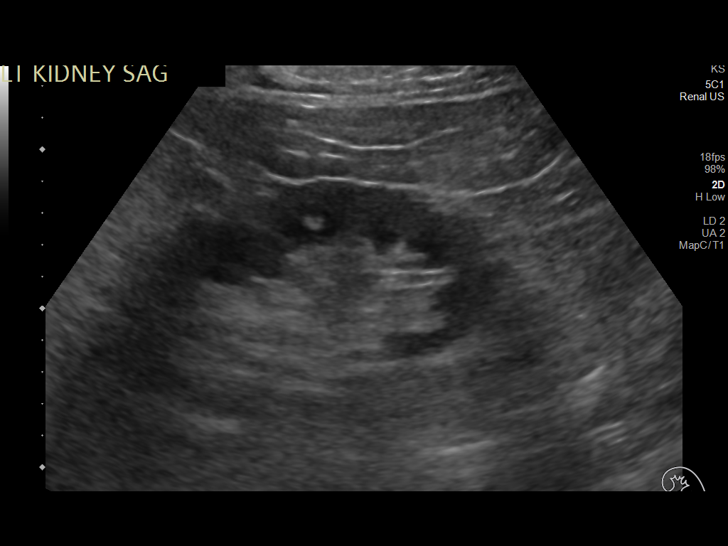
[im 21/28]
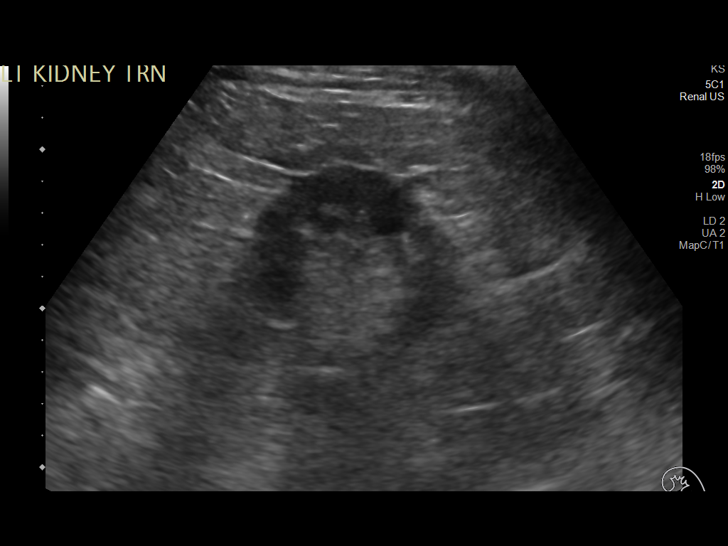
[im 23/28]
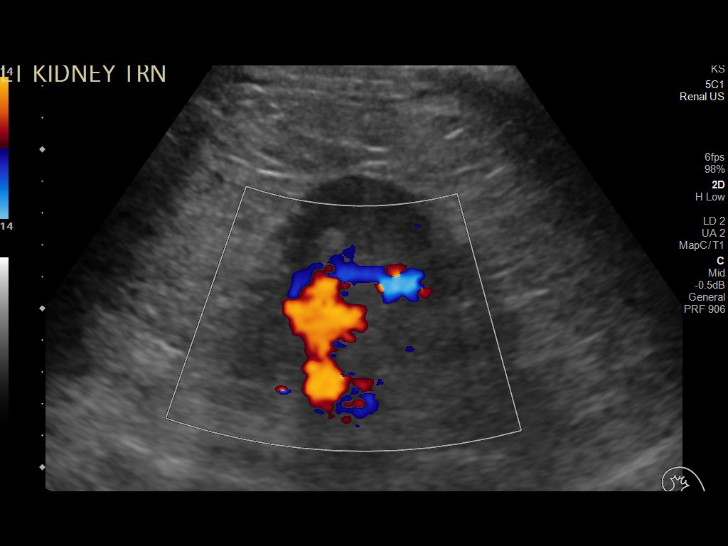
[im 25/28]
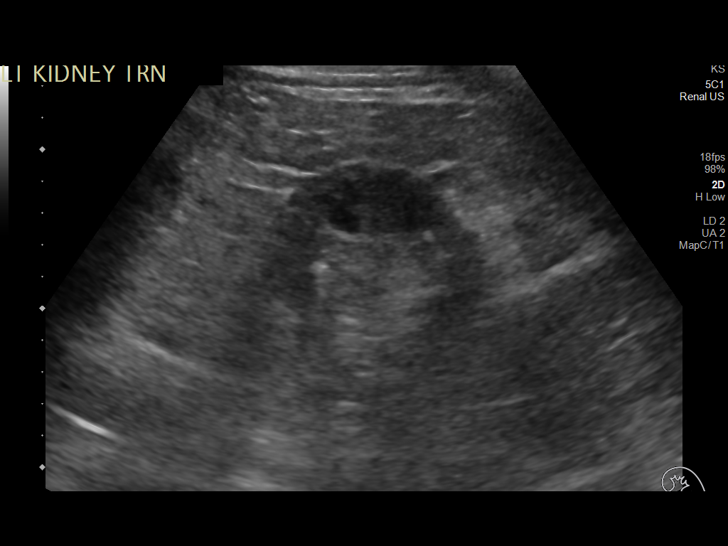
[im 28/28]
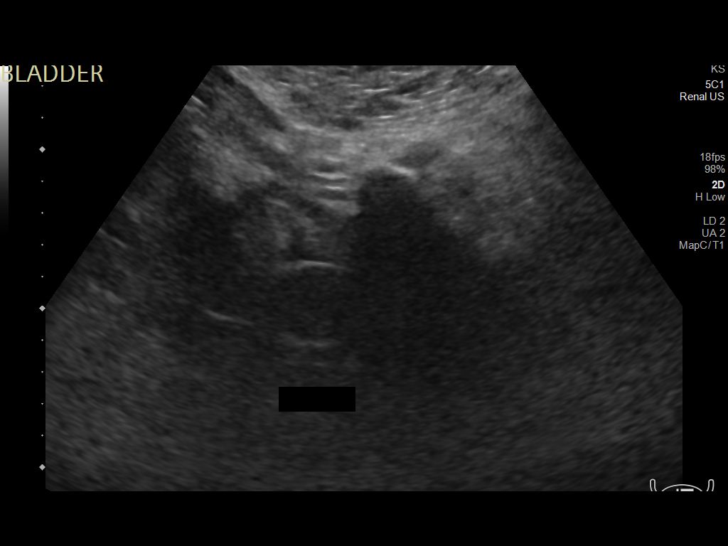

[14 of 25 positions shown; findings below may reference images not displayed]

FINDINGS: Right Kidney:

Renal measurements: 11.3 x 5.9 x 5.4 cm = volume: 187 mL. Mild
increased cortical echogenicity with parenchymal thinning and
cortical scarring. No hydronephrosis.

Left Kidney:

Renal measurements: 12.8 x 6.4 x 6.3 cm = volume: 272 mL. Increased
cortical echogenicity without hydronephrosis. Signs of mild cortical
scarring and parenchymal thinning

Bladder:

Decompressed with Foley catheter, not well evaluated.

Other:

None.
IMPRESSION: Signs of medical renal disease without hydronephrosis.

## 2022-01-25 IMAGING — DX DG CHEST 1V PORT
1 series · 1 of 1 positions shown · non-contrast
Comparison: 09/09/2021 and CT chest 09/07/2021.

CLINICAL DATA: Shortness of breath, respiratory failure.

EXAM:
PORTABLE CHEST 1 VIEW

[chest ap]
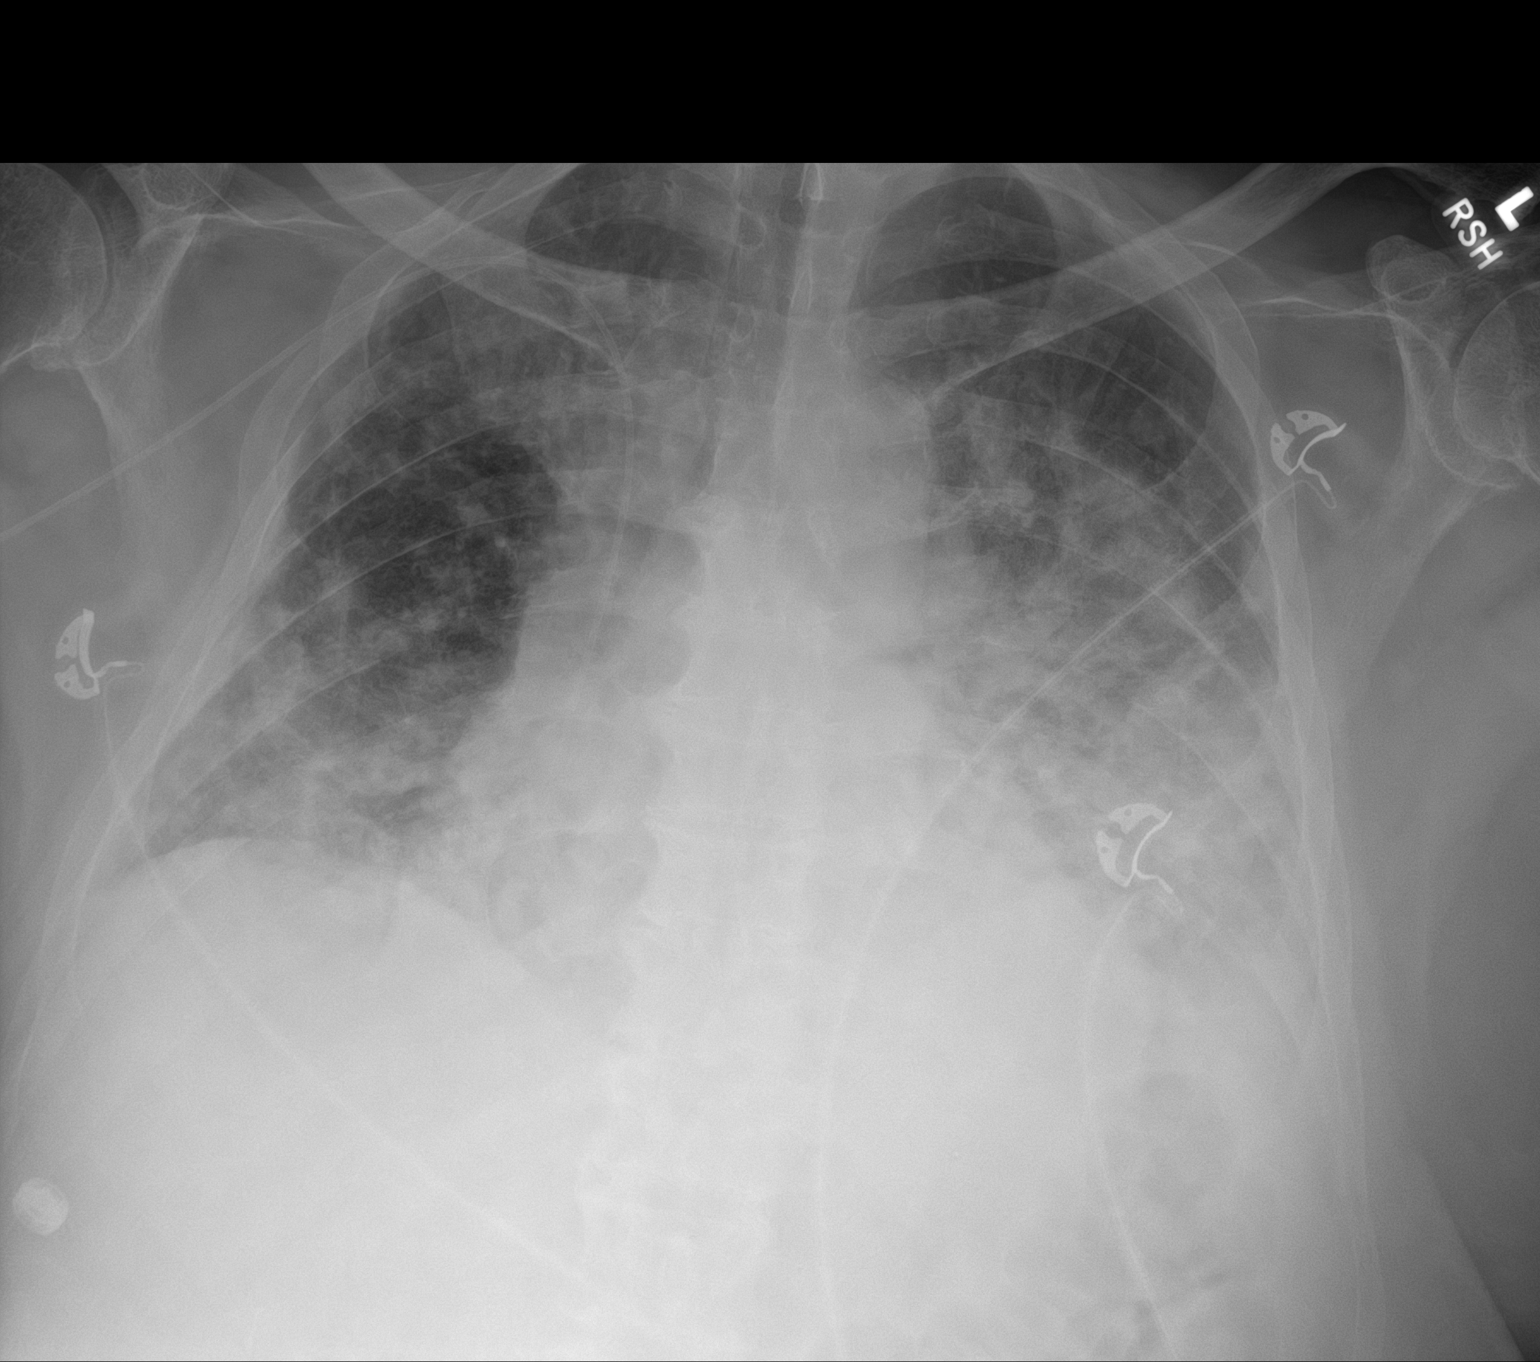

[1 of 1 positions shown; findings below may reference images not displayed]

FINDINGS: Trachea is midline. Heart size stable. Right PICC tip is at the SVC
RA junction. Mixed interstitial and airspace opacification
bilaterally, with slight improvement in aeration in the left
perihilar region. Probable bilateral pleural effusions.
IMPRESSION: 1. Persistent mixed interstitial and airspace opacification with
slight improvement in aeration in the left perihilar region,
findings likely due to pneumonia.
2. Probable bilateral pleural effusions.

## 2022-01-27 IMAGING — DX DG CHEST 1V
1 series · 1 of 1 positions shown · non-contrast
Comparison: Chest x-ray 09/11/2021.  CT chest 09/07/2021.

CLINICAL DATA: Cough, follow-up.

EXAM:
CHEST  1 VIEW

[chest]
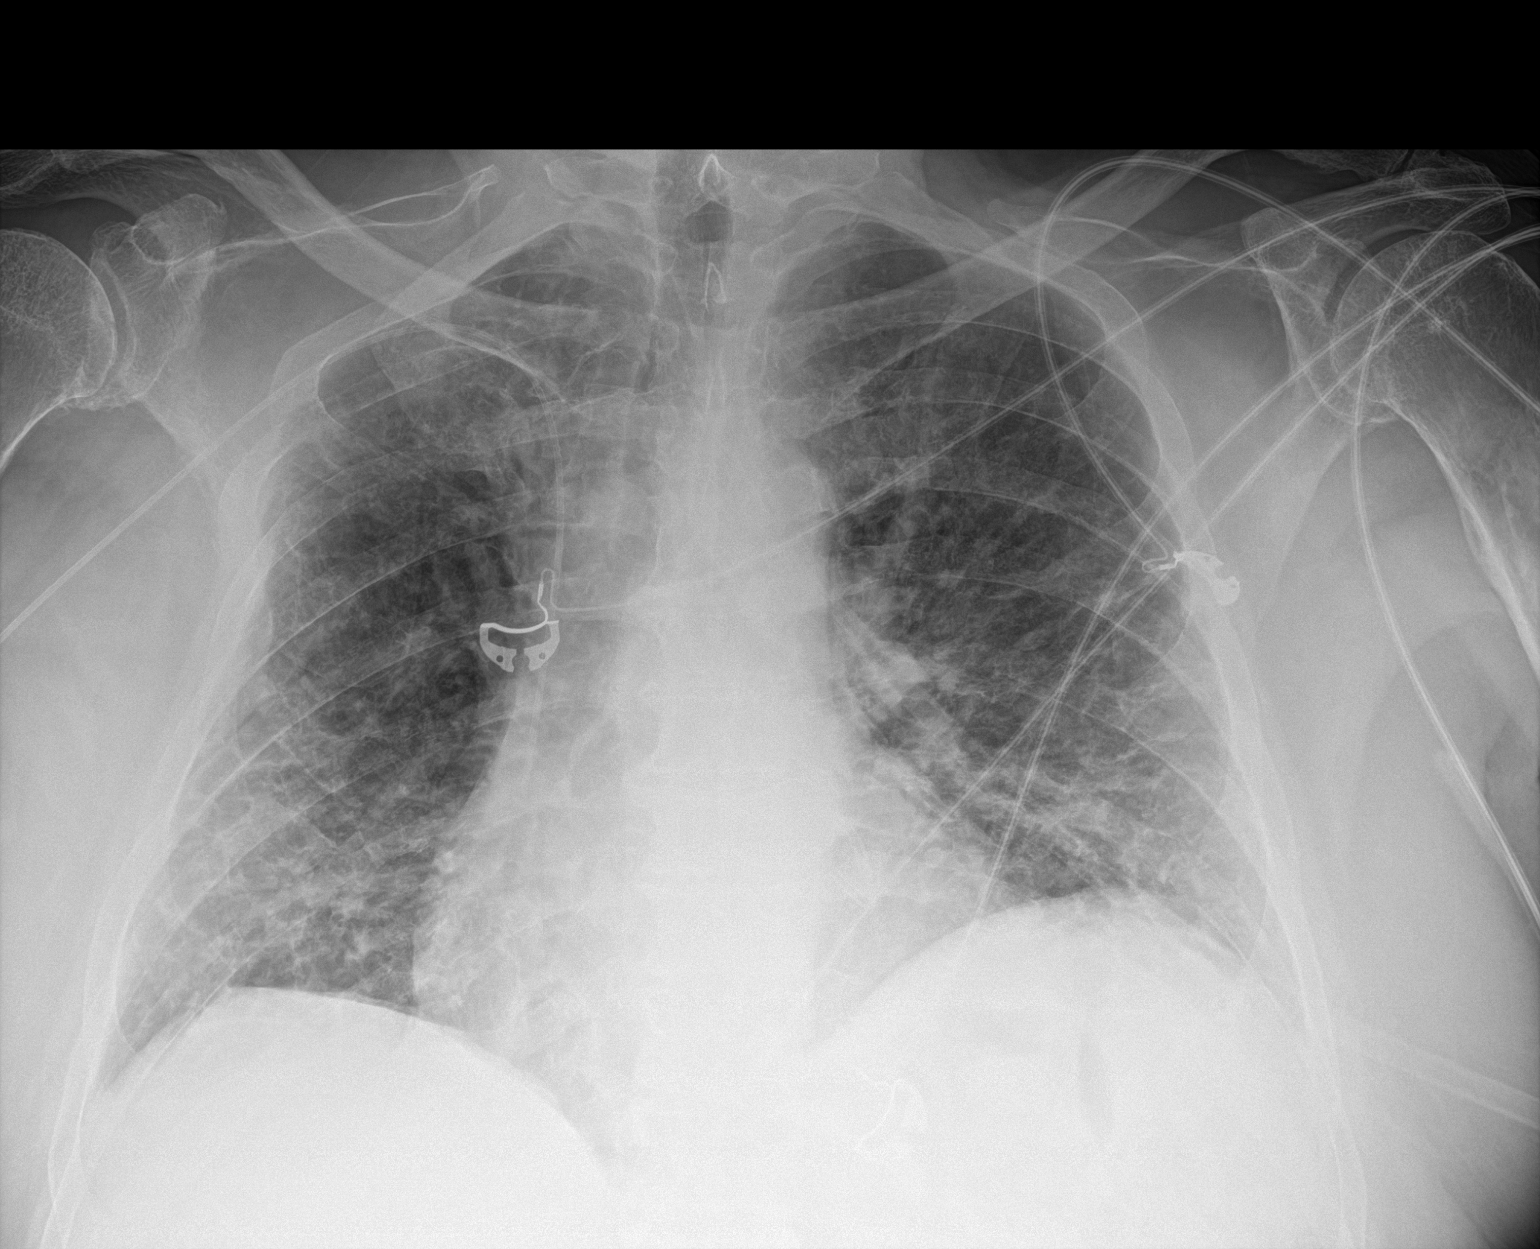

[1 of 1 positions shown; findings below may reference images not displayed]

FINDINGS: Patchy bilateral mid and lower lung airspace disease has
significantly decreased. Persistent minimal patchy and reticular
opacities are noted in the mid and lower lungs bilaterally. No
pleural effusion or pneumothorax. Right upper extremity PICC
terminates over the distal SVC. The cardiomediastinal silhouette is
within normal limits. Osseous structures are stable.
IMPRESSION: 1. Significant decrease in bilateral airspace disease.

## 2022-02-04 ENCOUNTER — Telehealth: Payer: Self-pay | Admitting: Internal Medicine

## 2022-02-04 NOTE — Telephone Encounter (Signed)
Patient has refills at his pharmacy. He needs to call them to request that they be made ready. His pharmacy claims that they do not have any requests for him.

## 2022-02-04 NOTE — Telephone Encounter (Signed)
Medication Refill - Medication: hydrALAZINE (APRESOLINE) 100 MG tablet  Has the patient contacted their pharmacy? Yes.   (Agent: If no, request that the patient contact the pharmacy for the refill. If patient does not wish to contact the pharmacy document the reason why and proceed with request.) (Agent: If yes, when and what did the pharmacy advise?)  Preferred Pharmacy (with phone number or street name):  [id number] [place] [address] Phone: [phone]: [phone]  Has the patient been seen for an appointment in the last year OR does the patient have an upcoming appointment? Yes.    Agent: Please be advised that RX refills may take up to 3 business days. We ask that you follow-up with your pharmacy.

## 2022-02-09 ENCOUNTER — Other Ambulatory Visit: Payer: Self-pay | Admitting: Internal Medicine

## 2022-02-09 DIAGNOSIS — E1142 Type 2 diabetes mellitus with diabetic polyneuropathy: Secondary | ICD-10-CM

## 2022-02-10 ENCOUNTER — Other Ambulatory Visit: Payer: Self-pay | Admitting: Internal Medicine

## 2022-02-10 DIAGNOSIS — E1142 Type 2 diabetes mellitus with diabetic polyneuropathy: Secondary | ICD-10-CM

## 2022-02-11 MED ORDER — TOUJEO SOLOSTAR 300 UNIT/ML ~~LOC~~ SOPN: 10.0000 [IU] | PEN_INJECTOR | Freq: Every day | SUBCUTANEOUS | 0 refills | Status: DC

## 2022-02-11 NOTE — Telephone Encounter (Signed)
Requested medication (s) are due for refill today: yes  Requested medication (s) are on the active medication list: yes  Last refill:  02/10/22  Future visit scheduled: yes  Notes to clinic:  pharmacy requesting alternative medication: Lantus not covered by pt's insurance advised to exchange to alternative medication   Requested Prescriptions  Pending Prescriptions Disp Refills   LANTUS SOLOSTAR 100 UNIT/ML Solostar Pen [Pharmacy Med Name: LANTUS SOLOSTAR 100 UNIT/ML]  0    Sig: Inject 10 Units into the skin daily.     Endocrinology:  Diabetes - Insulins Passed - 02/10/2022  5:47 PM      Passed - HBA1C is between 0 and 7.9 and within 180 days    HbA1c, POC (controlled diabetic range)  Date Value Ref Range Status  06/17/2021 7.1 (A) 0.0 - 7.0 % Final   Hgb A1c MFr Bld  Date Value Ref Range Status  01/10/2022 6.5 (H) 4.8 - 5.6 % Final    Comment:             Prediabetes: 5.7 - 6.4          Diabetes: >6.4          Glycemic control for adults with diabetes: <7.0          Passed - Valid encounter within last 6 months    Recent Outpatient Visits           3 months ago Hospital discharge follow-up   [facility] Jonah Blue B, MD   7 months ago Type 2 diabetes mellitus with diabetic polyneuropathy, with long-term current use of insulin (HCC)   [place] [facility] Jonah Blue B, MD   1 year ago Type 2 diabetes mellitus with diabetic polyneuropathy, with long-term current use of insulin (HCC)    [facility] Marcine Matar, MD   1 year ago Hospital discharge follow-up   [facility] Jonah Blue B, MD   2 years ago Type 2 diabetes mellitus with diabetic polyneuropathy, with long-term current use of insulin [facility])    [facility] Marcine Matar, MD       Future Appointments             In 1 month [place] Binnie Rail, MD  [facility]   In 5 months [place], Fayrene Fearing, MD [facility] [place], [facility]

## 2022-02-13 ENCOUNTER — Encounter (INDEPENDENT_AMBULATORY_CARE_PROVIDER_SITE_OTHER): Payer: 59 | Admitting: Ophthalmology

## 2022-02-13 ENCOUNTER — Telehealth: Payer: Self-pay | Admitting: *Deleted

## 2022-02-13 ENCOUNTER — Other Ambulatory Visit: Payer: Self-pay | Admitting: *Deleted

## 2022-02-13 DIAGNOSIS — Z5982 Transportation insecurity: Secondary | ICD-10-CM

## 2022-02-13 NOTE — Patient Outreach (Signed)
[facility]) Care Management Geriatric Nurse Practitioner Note   02/13/2022 Name:  Derek Lowery MRN:  [id number] DOB:  [birth year]  Summary: Completed all goals! Case is closed!  Recommendations/Changes made from today's visit: Continue all self management tasks. Call MD if you have any problems.  Subjective: Derek Lowery is an 66 y.o. year old male who is a primary patient of Ladell Pier, MD. The care management team was consulted for assistance with care management and/or care coordination needs.    Geriatric Nurse Practitioner completed Telephone Visit today.   Outpatient Encounter Medications as of 02/13/2022  Medication Sig Note   acetaminophen (TYLENOL) 325 MG tablet Take 2 tablets (650 mg total) by mouth every 6 (six) hours as needed for mild pain (or Fever >/= 101).    ammonium lactate (AMLACTIN) 12 % lotion Apply to both feet twice daily for dry skin. (Patient taking differently: 1 application  as needed for dry skin. Apply to both feet twice daily for dry skin.)    aspirin EC 81 MG tablet Take 1 tablet (81 mg total) by mouth daily.    atorvastatin (LIPITOR) 40 MG tablet TAKE 1 TABLET EVERY DAY    BD PEN NEEDLE NANO 2ND GEN 32G X 4 MM MISC USE AS DIRECTED 3 TIMES A DAY. PLEASE SCHEDULE AN APPOINTMENT FOR ADDITIONAL REFILLS.    brimonidine (ALPHAGAN) 0.2 % ophthalmic solution Place 1 drop into the right eye 2 (two) times daily.     carvedilol (COREG) 12.5 MG tablet TAKE 1 TABLET (12.5MG TOTAL) BY MOUTH TWICE A DAY WITH MEALS    Continuous Blood Gluc Receiver (DEXCOM G6 RECEIVER) DEVI Use to check blood sugar four times daily. E11.42    Continuous Blood Gluc Sensor (DEXCOM G6 SENSOR) MISC Use to check blood sugar four times daily. E11.42    Continuous Blood Gluc Transmit (DEXCOM G6 TRANSMITTER) MISC Use to check blood sugar four times daily. E11.42    dorzolamide (TRUSOPT) 2 % ophthalmic solution Place 1 drop into the right eye 2 (two) times daily.     hydrALAZINE (APRESOLINE) 100 MG tablet TAKE 1 TABLET BY MOUTH THREE TIMES A DAY    insulin glargine (LANTUS) 100 UNIT/ML injection Inject 10 Units into the skin at bedtime.    insulin lispro (HUMALOG) 100 UNIT/ML injection Inject into the skin 4 (four) times daily as needed for high blood sugar (if glucose is >200 take 2-3 units after the meal).    isosorbide mononitrate (IMDUR) 60 MG 24 hr tablet TAKE 1 TABLET BY MOUTH EVERY DAY    Lancets MISC Use as directed.  Accu chek 2    latanoprost (XALATAN) 0.005 % ophthalmic solution Place 1 drop into the right eye at bedtime. 09/07/2021: Patient is not sure if he is using these eye drops.last filled 06/14/21 90DS   Multiple Vitamins-Minerals (MULTIVITAMIN WITH MINERALS) tablet Take 1 tablet by mouth daily.    RHOPRESSA 0.02 % SOLN Place 1 drop into the right eye at bedtime.    timolol (TIMOPTIC) 0.5 % ophthalmic solution Place 1 drop into the right eye 2 (two) times daily.    torsemide (DEMADEX) 20 MG tablet TAKE 2 TABLETS (40MG) BY MOUTH IN THE MORNING AND 1 TABLET (20MG) BY MOUTH IN THE EVENING.    [DISCONTINUED] blood glucose meter kit and supplies KIT Dispense based on patient and insurance preference. Use up to four times daily as directed. One Touch Verio    [DISCONTINUED] glucose monitoring kit (FREESTYLE) monitoring kit  1 each by Does not apply route as needed for other.    [DISCONTINUED] insulin glargine, 1 Unit Dial, (TOUJEO SOLOSTAR) 300 UNIT/ML Solostar Pen Inject 10 Units into the skin daily. (Patient not taking: Reported on 02/13/2022)    No facility-administered encounter medications on file as of 02/13/2022.   SDOH Screenings   Alcohol Screen: Low Risk  (05/05/2021)   Alcohol Screen    Last Alcohol Screening Score (AUDIT): 0  Depression (PHQ2-9): Low Risk  (10/11/2021)   Depression (PHQ2-9)    PHQ-2 Score: 0  Financial Resource Strain: Low Risk  (05/05/2021)   Overall Financial Resource Strain (CARDIA)    Difficulty of Paying Living  Expenses: Not hard at all  Food Insecurity: No Food Insecurity (10/11/2021)   Hunger Vital Sign    Worried About Running Out of Food in the Last Year: Never true    [place] in the Last Year: Never true  Housing: Medium Risk (05/05/2021)   Housing    Last Housing Risk Score: 1  Physical Activity: Inactive (05/05/2021)   Exercise Vital Sign    Days of Exercise per Week: 0 days    Minutes of Exercise per Session: 0 min  Social Connections: Socially Isolated (05/05/2021)   Social Connection and Isolation Panel [NHANES]    Frequency of Communication with Friends and Family: Never    Frequency of Social Gatherings with Friends and Family: More than three times a week    Attends Religious Services: Never    Marine scientist or Organizations: No    Attends Archivist Meetings: Never    Marital Status: Never married  Stress: Stress Concern Present (05/05/2021)   [employer] [place]    Feeling of Stress : To some extent  Tobacco Use: Low Risk  (01/10/2022)   Patient History    Smoking Tobacco Use: Never    Smokeless Tobacco Use: Never    Passive Exposure: Not on file  Transportation Needs: Unmet Transportation Needs (02/13/2022)   PRAPARE - Transportation    Lack of Transportation (Medical): Yes    Lack of Transportation (Non-Medical): Yes     Care Plan  Review of patient past medical history, allergies, medications, health status, including review of consultants reports, laboratory and other test data, was performed as part of comprehensive evaluation for care management services.   Care Plan : [facility] NP Plan of Care  Updates made by [name], NP since 02/13/2022 12:00 AM     Problem: Heart Failure   Priority: High  Onset Date: 10/21/2021     Long-Range Goal: Patient will learn and follow HF Action Plan as evidenced by pt report and no hospitalizations over the next 90 days.   Start Date: 10/21/2021   Expected End Date: 01/22/2022  This Visit's Progress: On track  Recent Progress: On track  Priority: High  Note:    Update 02/13/22:  (Status: Goal Met.) Long Term Goal  Evaluation of current treatment plan related to following HF Action Plan and patient's adherence to plan as established by provider Following Action Plan, no exacerbations. Wt is stable around 215, no SOB, minimal edema. Just had OV with his cardiologist on no changes were made in his regimen. Advised to continue these practices for the rest of his life to prevent complications.  Update 01/14/22:  (Status: Goal Met.) Long Term Goal  Evaluation of current treatment plan related to HF Management and patient's adherence to plan as established  by provider  Mr. Brayboy wt is down to 217# he reports it has been as low as 212#. He is following his HF Action plan faithfully. He has seen his cardiologist. Medications adjusted:  Hydralazine increased to 100 mg tid.  Update 12/10/21:  (Status: Goal on track: NO.) Long Term Goal  Evaluation of current treatment plan related to HF management  and patient's adherence to plan as established by provider       Pt is following Action Plan: weighs daily, takes meds, avoids salt, denies SOB, significant wt gain and edema. He does admit his wt did go up to 229 due to increased fluid intake and it is down now to 224#. His baseline wt was 221#. He has not had to go to the ED. Reinforced his daily self management and to call MD or NP if he goes into the "YELLOW ZONE."   Update 11/04/21:  (Status: Goal on Track (progressing): YES.) Long Term Goal  Evaluation of current treatment plan related to HF Action Plan and patient's adherence to plan as established by provider       Patient knows to follow daily protocol: weigh in am, take meds, self assessment for general overall feeling, any SOB, edema, wt        gain. CURRENT WT IS 220, DENIES SOB, HAS 2+ EDEMA.       He knows not to add salt. He knows if he goes  into the [place] he needs to call his MD or NP.       Praised for his efforts to learn this daily management plan and urged to continue these habits for life.  Current Barriers:  Knowledge Deficits related to plan of care for management of CHF   RNCM Clinical Goal(s):  Patient will verbalize basic understanding of CHF disease process and self health management plan as evidenced by Being Able to discuss HF Action Plan and Zones  through collaboration with RN Care manager, provider, and care team.   Interventions: Inter-disciplinary care team collaboration (see longitudinal plan of care) Evaluation of current treatment plan related to  self management and patient's adherence to plan as established by provider       Assessed knowledge, new to education regarding HF. Has learned he needs to weigh daily and of course take his medications. He is able to tell me his weight 221#. Weight has actually gone down 5# since his discharge. We discussed the Zones today Green is Good, Yellow call MD and Avoid the Red Zone-prevent a hospitalization. Discussed low salt diet which he is working on, learning more about this. Provided education on reading labels, avoid process foods and fast foods. Needs to get some exercise other than walking around the house. Monitor swelling and respiratory status. Call for increased swelling   Patient Goals/Self-Care Activities: Take medications as prescribed   Attend all scheduled provider appointments      Problem: DIABETES MANAGEMENT   Priority: Medium  Onset Date: 10/21/2021     Goal: Patient will make appointments for dental and podiatry assessments over the next 30 days. (Extending additional 30 days for dental assessment)   Start Date: 12/10/2021  Expected End Date: 02/14/2022  This Visit's Progress: On track  Recent Progress: On track  Priority: High  Note:    6/29/23pdate :  (Status: Goal Met.) Short Term Goal  Evaluation of current treatment plan related  to Podiatry and Dental exams and patient's adherence to plan as established by provider Pt had podiatry evaluation  and tx of callus and debridement of his nails. He is scheduled for another evaluation in 3 months. Pt is waiting on reliable transportation to schedule his dental exam with prior specialist. He says he will make the appt after the 4th of July.  Update 01/14/22 :  (Status: Goal on Track (progressing): YES.) Short Term Goal  Evaluation of current treatment plan related to [facility] CLOSURES (DENTAL AND PODIATRY ASSESSMENTS)  and patient's adherence to plan as established by provider Pt has completed his podiatry appt. Plans on making his dental appt and having it down by the next time we talk in a month. Encouraged that dental exams should be done twice a year. Podiatry every 3 months.  Update 12/10/21:  (Status: Goal Not Met.) Short Term Goal  Evaluation of current treatment plan related to dental and podiatry appts and patient's adherence to plan as established by provider       Pt made his appts but transportation arranged via [facility] did not occur and pt had to cancel and reschedule his appts. [employer] representative visited him yesterday and reported she would advise of the transportation problems. Will continue this goal for another month.   Current Barriers:  Transportation barriers Advised to call [employer] to arrange for transportation.  RNCM Clinical Goal(s):  Patient will  make appts for dentist and podiatry  through collaboration with RN Care manager, provider, and care team.   Interventions: 1:1 collaboration with primary care provider regarding development and update of comprehensive plan of care as evidenced by provider attestation and co-signature Inter-disciplinary care team collaboration (see longitudinal plan of care) Evaluation of current treatment plan related to  self management and patient's adherence to plan as established by provider       Pt hasn't had a dental eval this year. He  needs a new dentist, an office without stairs or with an elevator. Pt states he is able to locate a new        provider. He will also make an appt for a foot evaluation to avoid complications of diabetes.  Patient Goals/Self-Care Activities: Attend all scheduled provider appointments      Long-Range Goal: Patient will learn the ABCs of Diabetes Management as evidenced by discussions and pt outcomes with NP over the next 3 months.   Start Date: 10/21/2021  Expected End Date: 01/22/2022  This Visit's Progress: On track  Recent Progress: On track  Priority: Medium  Note:    Update 02/14/31:  (Status: Goal Met.) Long Term Goal  Evaluation of current treatment plan related to ABCs of Diabetes managment and patient's adherence to plan as established by provider Pt last HbgA1C was 6.5, lowest ever. He has been able to reduce his meal time insulin to only occasionally. He uses his DEXCOM monitor which he loves. He follows his diet. He has had his eye exam and foot exam. Praised for his efforts and for getting all his annual DM tasks completed.  Update 5./30/23  (Status: Goal on Track (progressing): YES.) Long Term Goal  Evaluation of current treatment plan related to Diabetes management and patient's adherence to plan as established by provider Pt is following his DM plan he has no questions today. He reports his fasting blood sugars are usually in range <120. His NFBS are somewhat elevated. His HgbA1C was improved from 7.2 down to 6.5! We celebrated this milestone. Encouraged his continued compliance and avoidance of complications!  Update 12/10/21:  (Status: Goal on Track (progressing): YES.) Long Term Goal  Evaluation  of current treatment plan related to DM Management and patient's adherence to plan as established by provider       Pt glucose today after a meal was 198. Advised our NFBS goal is <160. He admits that eating too much bread is his down fall. He is eating a lower carb bread however which  is a good choice. His HbgA1C was 7.1 in January this has come down over the last year from 12.0 He is taking his diabetes meds appropriately. He thinks he has received the information NP sent him about self management but he has to go through his mail. Encouraged him to do so as this information is key for use to discuss to optimize his self care.  Update 11/04/21:  (Status: Goal on Track (progressing): YES.) Long Term Goal  Evaluation of current treatment plan related to Diabetes Management and patient's adherence to plan as established by provider       Pt reports that his glucose levels have improved with Lantas and as needed Novolog insulin at meal times although they are not        yet at goal. Today his FBS was 169 this has been the highest FBS he has had this week. They have ranged from 140-169. Pt has        had an AWV, eye exam and is up to date on his vaccinations except for getting the Shingles vaccine. He needs a dental and        podiatry visit. Making those short term goals for him to meet. He is learning about a low carb diet for those with DM. He has        purchased several books to assist him. NP to send him some Carb Counting tools.   Current Barriers:  Chronic Disease Management support and education needs related to DMII  RNCM Clinical Goal(s):  Patient will verbalize basic understanding of DMII disease process and self health management plan as evidenced by pt report and chart review.  through collaboration with RN Care manager, provider, and care team.   Interventions: Inter-disciplinary care team collaboration (see longitudinal plan of care) Evaluation of current treatment plan related to  self management and patient's adherence to plan as established by provider       Pt has had DM for a number of years but has not concentrated on efforts to manage optimally. Wife is a diabetic also and this is helpful. Pt uses a dexcom 6 for continuous glucose monitoring and checks his  values 6 times a day. His provider just changed his insulin from 70/30 to Lantus and they are on a titration schedule to get him to best control. He needs diet coaching. Ensure he meets his annul wellness needs: AWV, eye exam, dental exam, foot exam, immunizations.  Patient Goals/Self-Care Activities: Take medications as prescribed   Attend all scheduled provider appointments        Plan: No further follow up required: Case closed.  Eulah Pont. Myrtie Neither, MSN, [facility] Gerontological Nurse Practitioner [facility] Care Management [phone]

## 2022-02-13 NOTE — Telephone Encounter (Signed)
   Telephone encounter was:  Unsuccessful.  02/13/2022 Name: Derek Lowery MRN: [id number] DOB: Mar 21, 1956  Unsuccessful outbound call made today to assist with:  Transportation Needs   Outreach Attempt:  1st Attempt  A HIPAA compliant voice message was left requesting a return call.  Instructed patient to call back at   Instructed patient to call back at [phone]  at their earliest convenience. .  [place], Care Management  [phone] [address] , [place] [address] Email : [email or web address]

## 2022-02-14 ENCOUNTER — Telehealth: Payer: Self-pay | Admitting: *Deleted

## 2022-02-14 NOTE — Telephone Encounter (Signed)
   Telephone encounter was:  Unsuccessful.  02/14/2022 Name: Derek Lowery MRN: [id number] DOB: 18-Nov-1955  Unsuccessful outbound call made today to assist with:  Transportation Needs   Outreach Attempt:  2nd Attempt  A HIPAA compliant voice message was left requesting a return call.  Instructed patient to call back at   Instructed patient to call back at [phone]  at their earliest convenience. .  [place], Care Management  [phone] [address] , [place] [address] Email : [email or web address]

## 2022-02-17 ENCOUNTER — Telehealth: Payer: Self-pay | Admitting: *Deleted

## 2022-02-17 NOTE — Telephone Encounter (Signed)
   Telephone encounter was:  Unsuccessful.  02/17/2022 Name: Derek Lowery MRN: [id number] DOB: 14-Dec-1955  Unsuccessful outbound call made today to assist with:  Transportation Needs   Outreach Attempt:  3rd Attempt.  Referral closed unable to contact patient.  A HIPAA compliant voice message was left requesting a return call.  Instructed patient to call back at   Instructed patient to call back at [phone]  at their earliest convenience.  . [place], Care Management  [phone] [address] , [place] [address] Email : [email or web address]

## 2022-02-19 ENCOUNTER — Encounter (INDEPENDENT_AMBULATORY_CARE_PROVIDER_SITE_OTHER): Payer: 59 | Admitting: Ophthalmology

## 2022-02-20 ENCOUNTER — Ambulatory Visit (INDEPENDENT_AMBULATORY_CARE_PROVIDER_SITE_OTHER): Payer: 59 | Admitting: Ophthalmology

## 2022-02-20 DIAGNOSIS — H4051X3 Glaucoma secondary to other eye disorders, right eye, severe stage: Secondary | ICD-10-CM

## 2022-02-20 DIAGNOSIS — E113511 Type 2 diabetes mellitus with proliferative diabetic retinopathy with macular edema, right eye: Secondary | ICD-10-CM | POA: Diagnosis not present

## 2022-02-20 MED ORDER — BEVACIZUMAB 2.5 MG/0.1ML IZ SOSY
2.5000 mg | PREFILLED_SYRINGE | INTRAVITREAL | Status: AC | PRN
  Administered 2022-02-20: 2.5 mg via INTRAVITREAL

## 2022-02-20 NOTE — Progress Notes (Signed)
02/20/2022     CHIEF COMPLAINT Patient presents for  Chief Complaint  Patient presents with   Diabetic Retinopathy with Macular Edema      HISTORY OF PRESENT ILLNESS: DELOYD Lowery is a 66 y.o. male who presents to the clinic today for:   HPI   6 weeks for DILATE, OD, AVASTIN OCT. Pt stated vision has gotten blurry since last visit. Pt reports he missed his appnt last week and can tell he was due for a shot.  Last edited by Silvestre Moment on 02/20/2022 11:05 AM.      Referring physician: Ladell Pier, MD [address],  [place] [address]  HISTORICAL INFORMATION:   Selected notes from the MEDICAL RECORD NUMBER    Lab Results  Component Value Date   HGBA1C 6.5 (H) 01/10/2022     CURRENT MEDICATIONS: Current Outpatient Medications (Ophthalmic Drugs)  Medication Sig   brimonidine (ALPHAGAN) 0.2 % ophthalmic solution Place 1 drop into the right eye 2 (two) times daily.    dorzolamide (TRUSOPT) 2 % ophthalmic solution Place 1 drop into the right eye 2 (two) times daily.   latanoprost (XALATAN) 0.005 % ophthalmic solution Place 1 drop into the right eye at bedtime.   RHOPRESSA 0.02 % SOLN Place 1 drop into the right eye at bedtime.   timolol (TIMOPTIC) 0.5 % ophthalmic solution Place 1 drop into the right eye 2 (two) times daily.   No current facility-administered medications for this visit. (Ophthalmic Drugs)   Current Outpatient Medications (Other)  Medication Sig   acetaminophen (TYLENOL) 325 MG tablet Take 2 tablets (650 mg total) by mouth every 6 (six) hours as needed for mild pain (or Fever >/= 101).   ammonium lactate (AMLACTIN) 12 % lotion Apply to both feet twice daily for dry skin. (Patient taking differently: 1 application  as needed for dry skin. Apply to both feet twice daily for dry skin.)   aspirin EC 81 MG tablet Take 1 tablet (81 mg total) by mouth daily.   atorvastatin (LIPITOR) 40 MG tablet TAKE 1 TABLET EVERY DAY   BD PEN NEEDLE NANO  2ND GEN 32G X 4 MM MISC USE AS DIRECTED 3 TIMES A DAY. PLEASE SCHEDULE AN APPOINTMENT FOR ADDITIONAL REFILLS.   carvedilol (COREG) 12.5 MG tablet TAKE 1 TABLET (12.5MG TOTAL) BY MOUTH TWICE A DAY WITH MEALS   Continuous Blood Gluc Receiver (DEXCOM G6 RECEIVER) DEVI Use to check blood sugar four times daily. E11.42   Continuous Blood Gluc Sensor (DEXCOM G6 SENSOR) MISC Use to check blood sugar four times daily. E11.42   Continuous Blood Gluc Transmit (DEXCOM G6 TRANSMITTER) MISC Use to check blood sugar four times daily. E11.42   hydrALAZINE (APRESOLINE) 100 MG tablet TAKE 1 TABLET BY MOUTH THREE TIMES A DAY   insulin glargine (LANTUS) 100 UNIT/ML injection Inject 10 Units into the skin at bedtime.   insulin lispro (HUMALOG) 100 UNIT/ML injection Inject into the skin 4 (four) times daily as needed for high blood sugar (if glucose is >200 take 2-3 units after the meal).   isosorbide mononitrate (IMDUR) 60 MG 24 hr tablet TAKE 1 TABLET BY MOUTH EVERY DAY   Lancets MISC Use as directed.  Accu chek 2   Multiple Vitamins-Minerals (MULTIVITAMIN WITH MINERALS) tablet Take 1 tablet by mouth daily.   torsemide (DEMADEX) 20 MG tablet TAKE 2 TABLETS (40MG) BY MOUTH IN THE MORNING AND 1 TABLET (20MG) BY MOUTH IN THE EVENING.  No current facility-administered medications for this visit. (Other)      REVIEW OF SYSTEMS: ROS   Negative for: Constitutional, Gastrointestinal, Neurological, Skin, Genitourinary, Musculoskeletal, HENT, Endocrine, Cardiovascular, Eyes, Respiratory, Psychiatric, Allergic/Imm, Heme/Lymph Last edited by Silvestre Moment on 02/20/2022 11:06 AM.       ALLERGIES Allergies  Allergen Reactions   Empagliflozin Other (See Comments)    Four gangrene    PAST MEDICAL HISTORY Past Medical History:  Diagnosis Date   Arthritis    CAD (coronary artery disease)    NSTEMI 06/2011:  LHC 07/18/11: Proximal diagonal 60%, distal LAD with a diabetic appearance and 60% stenosis, OM2 with an occluded  superior branch and an inferior branch with 90%, EF 55% with inferior hypokinesis.  PCI: Promus DES to the OM2 inferior branch.  This vessel provides collaterals to the superior branch which remained occluded.  Echocardiogram 07/18/11: EF 60%, normal wall motion.   Cataract    Essential hypertension, benign    Glaucoma    Hypercholesteremia    Internal hemorrhoids    Noncompliance    Type 2 diabetes mellitus ([place]) 1990   Past Surgical History:  Procedure Laterality Date   CATARACT EXTRACTION Right 2019   Dr. Katy Fitch   CHOLECYSTECTOMY N/A 09/21/2017   Procedure: LAPAROSCOPIC CHOLECYSTECTOMY WITH INTRAOPERATIVE CHOLANGIOGRAM;  Surgeon: Michael Boston, MD;  Location: [facility];  Service: General;  Laterality: N/A;   COLONOSCOPY  2000   Dr. Collene Mares   EYE SURGERY     IRRIGATION AND DEBRIDEMENT ABSCESS Left 05/05/2014   Procedure: IRRIGATION AND DEBRIDEMENT ABSCESS left buttock;  Surgeon: Michael Boston, MD;  Location: [facility];  Service: General;  Laterality: Left;   LEFT HEART CATHETERIZATION WITH CORONARY ANGIOGRAM N/A 07/18/2011   Procedure: LEFT HEART CATHETERIZATION WITH CORONARY ANGIOGRAM;  Surgeon: Hillary Bow, MD;  Location[facility] LAB;  Service: Cardiovascular;  Laterality: N/A;   PERCUTANEOUS CORONARY STENT INTERVENTION (PCI-S)  07/18/2011   Procedure: PERCUTANEOUS CORONARY STENT INTERVENTION (PCI-S);  Surgeon: Hillary Bow, MD;  Location: [facility] CATH LAB;  Service: Cardiovascular;;    FAMILY HISTORY Family History  Problem Relation Age of Onset   Coronary artery disease Father        Developed in his 45s   Kidney disease Father    Diabetes Father    Melanoma Father    Rectal cancer Father    Heart failure Mother    Hypertension Mother    Diabetes Sister    Diabetes Brother    Colon cancer Neg Hx    Esophageal cancer Neg Hx    Stomach cancer Neg Hx     SOCIAL HISTORY Social History   Tobacco Use   Smoking status: Never   Smokeless tobacco: Never  Vaping Use   Vaping Use:  Never used  Substance Use Topics   Alcohol use: No   Drug use: No         OPHTHALMIC EXAM:  Base Eye Exam     Visual Acuity (ETDRS)       Right Left   Dist [place] 20/25 -2 NLP         Tonometry (Tonopen, 11:10 AM)       Right Left   Pressure 13 17         Pupils       Dark Light Shape React APD   Right   Irregular Minimal None   Left 3 3 Round  +1         Visual Fields  Left Right     Full   Restrictions Total superior temporal, inferior temporal, superior nasal, inferior nasal deficiencies          Neuro/Psych     Oriented x3: Yes   Mood/Affect: Normal         Dilation     Right eye: 2.5% Phenylephrine, 1.0% Mydriacyl @ 11:10 AM           Slit Lamp and Fundus Exam     External Exam       Right Left   External Normal Normal         Slit Lamp Exam       Right Left   Lids/Lashes Normal Normal   Conjunctiva/Sclera White and quiet, no exposure of tube,cystic conj covers tube well White and quiet   Cornea Clear Clear   Anterior Chamber ,, Tube 12 meridian Deep   Iris NO new NVI large trunks, no signs of new  NVI, updrawn pupil, 3.5 mm, undilated Old neovascularization of the iris 360 degrees, stable nonprogressive   Lens Posterior chamber intraocular lens 3.5+ Nuclear sclerosis   Anterior Vitreous Normal          Fundus Exam       Right Left   Posterior Vitreous Posterior vitreous detachment    Disc Normal    C/D Ratio 0.7    Macula Microaneurysms, Retinal pigment epithelial atrophy, Macular thickening, no clinically significant macular edema    Vessels Proliferative diabetic retinopathy, quiet    Periphery  good panretinal photocoagulation, post recent PRP             IMAGING AND PROCEDURES  Imaging and Procedures for 02/20/22  OCT, Retina - OU - Both Eyes       Right Eye Quality was good. Scan locations included subfoveal. Central Foveal Thickness: 264. Progression has been stable. Findings include cystoid  macular edema.   Notes Chronic CSME, stable overall with increase in CSME, currently at 5-week interval.  Thus proving this patient does require ongoing chronic suppressive therapy      Intravitreal Injection, Pharmacologic Agent - OD - Right Eye       Time Out 02/20/2022. 11:55 AM. Confirmed correct patient, procedure, site, and patient consented.   Anesthesia Topical anesthesia was used. Anesthetic medications included Lidocaine 4%.   Procedure Preparation included 5% betadine to ocular surface, 10% betadine to eyelids, Tobramycin 0.3%. A 30 gauge needle was used.   Injection: 2.5 mg bevacizumab 2.5 MG/0.1ML   Route: Intravitreal, Site: Right Eye   NDC: [phone], Lot: [id number], Expiration date: 04/18/2022   Post-op Post injection exam found visual acuity of at least counting fingers. The patient tolerated the procedure well. There were no complications. The patient received written and verbal post procedure care education. Post injection medications included ocuflox.              ASSESSMENT/PLAN:  Neovascular glaucoma, right eye, severe stage Controlled, doing well at current interval examination and stabilization with intravitreal Avastin OD  Proliferative diabetic retinopathy of right eye with macular edema associated with type 2 diabetes mellitus ([place]) Controlled on regular schedule examination intervals of every 6 weeks repeat Avastin today     ICD-10-CM   1. Proliferative diabetic retinopathy of right eye with macular edema associated with type 2 diabetes mellitus (HCC)  E11.3511 OCT, Retina - OU - Both Eyes    Intravitreal Injection, Pharmacologic Agent - OD - Right Eye    bevacizumab (AVASTIN) SOSY 2.5 mg  2. Neovascular glaucoma, right eye, severe stage  H40.51X3       1.  The with chronic neovascular glaucoma in CSME recurrent without evaluation or therapy.  Currently at 6-week follow-up again in 6 months  2.  3.  Ophthalmic Meds Ordered this  visit:  Meds ordered this encounter  Medications   bevacizumab (AVASTIN) SOSY 2.5 mg       Return in about 6 weeks (around 04/03/2022) for dilate, OD, AVASTIN OCT.  There are no Patient Instructions on file for this visit.   Explained the diagnoses, plan, and follow up with the patient and they expressed understanding.  Patient expressed understanding of the importance of proper follow up care.   Clent Demark Greenlee Ancheta M.D. Diseases & Surgery of the Retina and Vitreous [facility] [place] 02/20/22     Abbreviations: M myopia (nearsighted); A astigmatism; H hyperopia (farsighted); P presbyopia; Mrx spectacle prescription;  CTL contact lenses; OD right eye; OS left eye; OU both eyes  XT exotropia; ET esotropia; PEK punctate epithelial keratitis; PEE punctate epithelial erosions; DES dry eye syndrome; MGD meibomian gland dysfunction; ATs artificial tears; PFAT's preservative free artificial tears; [place] nuclear sclerotic cataract; PSC posterior subcapsular cataract; ERM epi-retinal membrane; PVD posterior vitreous detachment; RD retinal detachment; DM diabetes mellitus; DR diabetic retinopathy; NPDR non-proliferative diabetic retinopathy; PDR proliferative diabetic retinopathy; CSME clinically significant macular edema; DME diabetic macular edema; dbh dot blot hemorrhages; CWS cotton wool spot; POAG primary open angle glaucoma; C/D cup-to-disc ratio; HVF humphrey visual field; GVF goldmann visual field; OCT optical coherence tomography; IOP intraocular pressure; BRVO Branch retinal vein occlusion; CRVO central retinal vein occlusion; CRAO central retinal artery occlusion; BRAO branch retinal artery occlusion; RT retinal tear; SB scleral buckle; PPV pars plana vitrectomy; VH Vitreous hemorrhage; PRP panretinal laser photocoagulation; IVK intravitreal kenalog; VMT vitreomacular traction; MH Macular hole;  NVD neovascularization of the disc; NVE neovascularization elsewhere; AREDS age related eye  disease study; ARMD age related macular degeneration; POAG primary open angle glaucoma; EBMD epithelial/anterior basement membrane dystrophy; ACIOL anterior chamber intraocular lens; IOL intraocular lens; PCIOL posterior chamber intraocular lens; Phaco/IOL phacoemulsification with intraocular lens placement; [place] photorefractive keratectomy; LASIK laser assisted in situ keratomileusis; HTN hypertension; DM diabetes mellitus; COPD chronic obstructive pulmonary disease

## 2022-02-20 NOTE — Assessment & Plan Note (Signed)
Controlled on regular schedule examination intervals of every 6 weeks repeat Avastin today

## 2022-02-20 NOTE — Assessment & Plan Note (Signed)
Controlled, doing well at current interval examination and stabilization with intravitreal Avastin OD

## 2022-03-27 ENCOUNTER — Encounter: Payer: Self-pay | Admitting: Internal Medicine

## 2022-03-27 ENCOUNTER — Ambulatory Visit: Payer: 59 | Attending: Internal Medicine | Admitting: Internal Medicine

## 2022-03-27 VITALS — BP 165/74 | HR 57 | Temp 98.1°F | Ht 70.0 in | Wt 222.8 lb

## 2022-03-27 DIAGNOSIS — E1159 Type 2 diabetes mellitus with other circulatory complications: Secondary | ICD-10-CM

## 2022-03-27 DIAGNOSIS — Z23 Encounter for immunization: Secondary | ICD-10-CM

## 2022-03-27 DIAGNOSIS — I5022 Chronic systolic (congestive) heart failure: Secondary | ICD-10-CM

## 2022-03-27 DIAGNOSIS — Z794 Long term (current) use of insulin: Secondary | ICD-10-CM | POA: Diagnosis not present

## 2022-03-27 DIAGNOSIS — F5101 Primary insomnia: Secondary | ICD-10-CM

## 2022-03-27 DIAGNOSIS — E1142 Type 2 diabetes mellitus with diabetic polyneuropathy: Secondary | ICD-10-CM

## 2022-03-27 DIAGNOSIS — I152 Hypertension secondary to endocrine disorders: Secondary | ICD-10-CM

## 2022-03-27 DIAGNOSIS — I25118 Atherosclerotic heart disease of native coronary artery with other forms of angina pectoris: Secondary | ICD-10-CM

## 2022-03-27 DIAGNOSIS — N1832 Chronic kidney disease, stage 3b: Secondary | ICD-10-CM

## 2022-03-27 LAB — POCT GLYCOSYLATED HEMOGLOBIN (HGB A1C): HbA1c, POC (controlled diabetic range): 6.2 % (ref 0.0–7.0)

## 2022-03-27 LAB — GLUCOSE, POCT (MANUAL RESULT ENTRY): POC Glucose: 137 mg/dl — AB (ref 70–99)

## 2022-03-27 MED ORDER — ISOSORBIDE MONONITRATE ER 120 MG PO TB24: 120.0000 mg | ORAL_TABLET | Freq: Every day | ORAL | 6 refills | Status: DC

## 2022-03-27 MED ORDER — TRAZODONE HCL 50 MG PO TABS: 25.0000 mg | ORAL_TABLET | Freq: Every day | ORAL | 3 refills | Status: DC

## 2022-03-27 NOTE — Patient Instructions (Signed)
Please return to the lab next week to have your blood tests done.

## 2022-03-27 NOTE — Progress Notes (Signed)
Patient ID: Derek Lowery, male    DOB: 08-Aug-1956  MRN: [id number]  CC: Diabetes   Subjective: Derek Lowery is a 66 y.o. male who presents for chronic ds management His concerns today include:  hx of DM type 2 with retinopathy(laser treatments by Dr. [name]), peripheral neuropathy, and microalbuminuria, HTN, CAD with stent OM2 in 2012 (NSTEMI 02/7823, diastolic CHF (EF 23-53% 01/1442), glaucoma (blind LT eye) and HL.  HTN/CAD/CHF:  -CP if he over exerts himself like lifting or moving stuff around.  Goes away with rest.   No shortness of breath.  He has chronic edema in the lower legs that has not increased.  Saw the cardiologist Dr. Percival Spanish back in May. Reports compliance with taking his medications including carvedilol 12.5 mg twice a day, Lipitor 40 mg daily, hydralazine 100 mg 3 times a day, torsemide 40 mg in the morning and 20 mg in the evening, isosorbide 60 mg daily   CKD: saw Dr. Johnney Ou a few mths ago.  Last note that I received from her was when he was seen in March of this year. She thinks patient would benefit from being on an ACE inhibitor but given lack of transportation she was hesitant to start the ACE inhibitor until he can be monitored.  She also thinks he would benefit from SGLT2i but contraindicated as patient had Fournier's in 2015. -Last GFR in the system was 40 with creatinine of 1.86 back in May of this year.  DM:  has Dexcom.  Overall blood sugars look like they are staying below 200.  However patient tells me that he has days when the blood sugars go up above 200 and he has to use some Humalog that he still has from when he was previously on it.  Few hypoglycemic episodes for which she is alerted by the [facility] device.  He is taking glargine insulin 10 units daily.  He feels he can do better with his eating habits but he has to eat what he can afford.  Used to qualify for food stamps but not anymore. Results for orders placed or performed in visit on 03/27/22   POCT glucose (manual entry)  Result Value Ref Range   POC Glucose 137 (A) 70 - 99 mg/dl  POCT glycosylated hemoglobin (Hb A1C)  Result Value Ref Range   Hemoglobin A1C     HbA1c POC (<> result, manual entry)     HbA1c, POC (prediabetic range)     HbA1c, POC (controlled diabetic range) 6.2 0.0 - 7.0 %   Complains of problems sleeping at nights.  He usually gets in bed around about the same time and turns off the TV.  He does not drink caffeinated beverages or alcoholic beverages.  He has been trying Tylenol PM and melatonin over-the-counter without any improvement.  HM:  never got shingles vaccine.  Transportation issues. Missed appt with Dr. Katy Fitch last Friday.  Will reschedule.  Sees [place] [facility] 6 wks Patient Active Problem List   Diagnosis Date Noted   Leg swelling 01/09/2022   Coronary artery disease involving native coronary artery of native heart without angina pectoris 01/09/2022   Acute pulmonary edema (HCC)    Multifocal pneumonia 15/40/0867   Acute systolic CHF (congestive heart failure) ([place]) 09/07/2021   Chronic diastolic CHF (congestive heart failure) ([place]) 06/17/2021   Neovascular glaucoma, right eye, severe stage 06/13/2021   Acute exacerbation of CHF (congestive heart failure) ([place]) 12/24/2020   Pneumatouria 06/28/2020   Acute respiratory failure with  hypoxia ([place]) 06/28/2020   Pneumonia due to COVID-19 virus 06/28/2020   Proliferative diabetic retinopathy of right eye with macular edema associated with type 2 diabetes mellitus ([place]) 11/30/2019   Rubeosis iridis of right eye 11/30/2019   Glaucoma with pupillary seclusion, unspecified laterality, severe stage 11/30/2019   Trigger middle finger of right hand 10/17/2019   Pain due to onychomycosis of toenails of both feet 08/07/2019   Educated about COVID-19 virus infection 12/02/2018   RBBB 12/02/2018   Benign paroxysmal positional vertigo 06/25/2018   Pre-ulcerative corn or callous 10/13/2017   Anemia 10/13/2017    Acute kidney injury superimposed on CKD ([place])    Chronic diarrhea 06/09/2017   Microalbuminuria 03/07/2017   Dry skin 03/05/2017   Glaucoma 03/05/2017   Legally blind 03/05/2017   Retinopathy due to secondary diabetes ([place]) 02/06/2016   Diabetic polyneuropathy associated with type 2 diabetes mellitus ([place]) 01/12/2015   Fournier's gangrene into true pelvis s/p I&D 05/05/2014 05/05/2014   Type 2 diabetes mellitus with hyperlipidemia ([place]) 07/17/2011   NSTEMI (non-ST elevated myocardial infarction) ([place]) 07/17/2011   Essential hypertension 07/17/2011     Current Outpatient Medications on File Prior to Visit  Medication Sig Dispense Refill   acetaminophen (TYLENOL) 325 MG tablet Take 2 tablets (650 mg total) by mouth every 6 (six) hours as needed for mild pain (or Fever >/= 101).     ammonium lactate (AMLACTIN) 12 % lotion Apply to both feet twice daily for dry skin. (Patient taking differently: 1 application  as needed for dry skin. Apply to both feet twice daily for dry skin.) 400 g 6   aspirin EC 81 MG tablet Take 1 tablet (81 mg total) by mouth daily. 30 tablet 4   atorvastatin (LIPITOR) 40 MG tablet TAKE 1 TABLET EVERY DAY 90 tablet 2   BD PEN NEEDLE NANO 2ND GEN 32G X 4 MM MISC USE AS DIRECTED 3 TIMES A DAY. PLEASE SCHEDULE AN APPOINTMENT FOR ADDITIONAL REFILLS. 100 each 3   brimonidine (ALPHAGAN) 0.2 % ophthalmic solution Place 1 drop into the right eye 2 (two) times daily.      carvedilol (COREG) 12.5 MG tablet TAKE 1 TABLET (12.5MG TOTAL) BY MOUTH TWICE A DAY WITH MEALS 180 tablet 1   Continuous Blood Gluc Receiver (DEXCOM G6 RECEIVER) DEVI Use to check blood sugar four times daily. E11.42 1 each 0   Continuous Blood Gluc Sensor (DEXCOM G6 SENSOR) MISC Use to check blood sugar four times daily. E11.42 3 each 3   Continuous Blood Gluc Transmit (DEXCOM G6 TRANSMITTER) MISC Use to check blood sugar four times daily. E11.42 1 each 3   dorzolamide (TRUSOPT) 2 % ophthalmic solution Place 1  drop into the right eye 2 (two) times daily.     hydrALAZINE (APRESOLINE) 100 MG tablet TAKE 1 TABLET BY MOUTH THREE TIMES A DAY 270 tablet 1   insulin glargine (LANTUS) 100 UNIT/ML injection Inject 10 Units into the skin at bedtime.     insulin lispro (HUMALOG) 100 UNIT/ML injection Inject into the skin 4 (four) times daily as needed for high blood sugar (if glucose is >200 take 2-3 units after the meal).     Lancets MISC Use as directed.  Accu chek 2 100 each 11   latanoprost (XALATAN) 0.005 % ophthalmic solution Place 1 drop into the right eye at bedtime.     Multiple Vitamins-Minerals (MULTIVITAMIN WITH MINERALS) tablet Take 1 tablet by mouth daily.     RHOPRESSA 0.02 % SOLN Place  1 drop into the right eye at bedtime.     timolol (TIMOPTIC) 0.5 % ophthalmic solution Place 1 drop into the right eye 2 (two) times daily.     torsemide (DEMADEX) 20 MG tablet TAKE 2 TABLETS (40MG) BY MOUTH IN THE MORNING AND 1 TABLET (20MG) BY MOUTH IN THE EVENING. 270 tablet 1   No current facility-administered medications on file prior to visit.    Allergies  Allergen Reactions   Empagliflozin Other (See Comments)    Four gangrene    Social History   Socioeconomic History   Marital status: Single    Spouse name: Not on file   Number of children: Not on file   Years of education: Not on file   Highest education level: Not on file  Occupational History   Occupation: Janitor    Employer: [employer]  Tobacco Use   Smoking status: Never   Smokeless tobacco: Never  Vaping Use   Vaping Use: Never used  Substance and Sexual Activity   Alcohol use: No   Drug use: No   Sexual activity: Never  Other Topics Concern   Not on file  Social History Narrative   Works at [employer]   [phone] [place] Strain: [place]  (05/05/2021)   Overall Financial Resource Strain (CARDIA)    Difficulty of Paying Living Expenses: Not hard at all  Food  Insecurity: No Food Insecurity (10/11/2021)   Hunger Vital Sign    Worried About Running Out of Food in the Last Year: Never true    [place] in the Last Year: Never true  Transportation Needs: Unmet Transportation Needs (02/13/2022)   PRAPARE - Transportation    Lack of Transportation (Medical): Yes    Lack of Transportation (Non-Medical): Yes  Physical Activity: Inactive (05/05/2021)   Exercise Vital Sign    Days of Exercise per Week: 0 days    Minutes of Exercise per Session: 0 min  Stress: Stress Concern Present (05/05/2021)   [place]    Feeling of Stress : To some extent  Social Connections: Socially Isolated (05/05/2021)   Social Connection and Isolation Panel [NHANES]    Frequency of Communication with Friends and Family: Never    Frequency of Social Gatherings with Friends and Family: More than three times a week    Attends Religious Services: Never    Marine scientist or Organizations: No    Attends Archivist Meetings: Never    Marital Status: Never married  Intimate Partner Violence: Not At Risk (05/05/2021)   Humiliation, Afraid, Rape, and Kick questionnaire    Fear of Current or Ex-Partner: No    Emotionally Abused: No    Physically Abused: No    Sexually Abused: No    Family History  Problem Relation Age of Onset   Coronary artery disease Father        Developed in his 2s   Kidney disease Father    Diabetes Father    Melanoma Father    Rectal cancer Father    Heart failure Mother    Hypertension Mother    Diabetes Sister    Diabetes Brother    Colon cancer Neg Hx    Esophageal cancer Neg Hx    Stomach cancer Neg Hx     Past Surgical History:  Procedure Laterality Date   CATARACT EXTRACTION Right 2019   Dr.  Groat   CHOLECYSTECTOMY N/A 09/21/2017   Procedure: LAPAROSCOPIC CHOLECYSTECTOMY WITH INTRAOPERATIVE CHOLANGIOGRAM;  Surgeon: Michael Boston, MD;  Location: [facility];   Service: General;  Laterality: N/A;   COLONOSCOPY  2000   Dr. Collene Mares   EYE SURGERY     IRRIGATION AND DEBRIDEMENT ABSCESS Left 05/05/2014   Procedure: IRRIGATION AND DEBRIDEMENT ABSCESS left buttock;  Surgeon: Michael Boston, MD;  Location: [facility];  Service: General;  Laterality: Left;   LEFT HEART CATHETERIZATION WITH CORONARY ANGIOGRAM N/A 07/18/2011   Procedure: LEFT HEART CATHETERIZATION WITH CORONARY ANGIOGRAM;  Surgeon: Hillary Bow, MD;  Location: [facility] CATH LAB;  Service: Cardiovascular;  Laterality: N/A;   PERCUTANEOUS CORONARY STENT INTERVENTION (PCI-S)  07/18/2011   Procedure: PERCUTANEOUS CORONARY STENT INTERVENTION (PCI-S);  Surgeon: Hillary Bow, MD;  Location[facility];  Service: Cardiovascular;;    ROS: Review of Systems Negative except as stated above  PHYSICAL EXAM: BP (!) 165/74   Pulse (!) 57   Temp 98.1 F (36.7 C) (Oral)   Ht 5' 10"  (1.778 m)   Wt 222 lb 12.8 oz (101.1 kg)   SpO2 99%   BMI 31.97 kg/m   Wt Readings from Last 3 Encounters:  03/27/22 222 lb 12.8 oz (101.1 kg)  01/10/22 223 lb 9.6 oz (101.4 kg)  10/18/21 229 lb 3.7 oz (104 kg)    Physical Exam Repeat blood pressure 180/70. General appearance -elderly appearing Caucasian male  and in no distress.  He ambulates with a standard walker. Mental status - normal mood, behavior, speech, dress, motor activity, and thought processes Neck - supple, no significant adenopathy Chest - clear to auscultation, no wheezes, rales or rhonchi, symmetric air entry Heart - normal rate, regular rhythm, normal S1, S2, no murmurs, rubs, clicks or gallops Extremities -1+ bilateral lower extremity edema      Latest Ref Rng & Units 01/10/2022   10:14 AM 10/18/2021   11:54 AM 09/16/2021    5:36 AM  CMP  Glucose 70 - 99 mg/dL 149  168  185   BUN 8 - 27 mg/dL 49  39  78   Creatinine 0.76 - 1.27 mg/dL 1.86  1.86  2.85   Sodium 134 - 144 mmol/L 145  142  135   Potassium 3.5 - 5.2 mmol/L 3.9  4.1  3.7   Chloride  96 - 106 mmol/L 103  102  88   CO2 20 - 29 mmol/L 31  26  35   Calcium 8.6 - 10.2 mg/dL 9.1  9.5  8.9   Total Protein 6.0 - 8.5 g/dL  6.6    Total Bilirubin 0.0 - 1.2 mg/dL  0.4    Alkaline Phos 44 - 121 IU/L  70    AST 0 - 40 IU/L  23    ALT 0 - 44 IU/L  13     Lipid Panel     Component Value Date/Time   CHOL 95 09/08/2021 0500   CHOL 150 06/17/2021 1145   TRIG 38 09/08/2021 0500   HDL 33 (L) 09/08/2021 0500   HDL 35 (L) 06/17/2021 1145   CHOLHDL 2.9 09/08/2021 0500   VLDL 8 09/08/2021 0500   LDLCALC 54 09/08/2021 0500   LDLCALC 87 06/17/2021 1145    CBC    Component Value Date/Time   WBC 5.7 10/18/2021 1154   WBC 9.6 09/14/2021 0315   RBC 3.56 (L) 10/18/2021 1154   RBC 2.80 (L) 09/14/2021 0315   HGB 10.3 (L) 10/18/2021 1154  HCT 30.7 (L) 10/18/2021 1154   PLT 150 10/18/2021 1154   MCV 86 10/18/2021 1154   MCH 28.9 10/18/2021 1154   MCH 28.2 09/14/2021 0315   MCHC 33.6 10/18/2021 1154   MCHC 32.8 09/14/2021 0315   RDW 14.5 10/18/2021 1154   LYMPHSABS 0.5 (L) 09/07/2021 1030   LYMPHSABS 1.8 07/12/2019 1042   MONOABS 0.8 09/07/2021 1030   EOSABS 0.0 09/07/2021 1030   EOSABS 0.3 07/12/2019 1042   BASOSABS 0.0 09/07/2021 1030   BASOSABS 0.1 07/12/2019 1042    ASSESSMENT AND PLAN: 1. Type 2 diabetes mellitus with diabetic polyneuropathy, with long-term current use of insulin (HCC) At goal.  He will continue Lantus 10 units at nights.  I told him it is okay for him to keep the Humalog pens and use as needed.  I am glad to see that he is effectively using his Dexcom device. - POCT glucose (manual entry) - POCT glycosylated hemoglobin (Hb A1C) - Comprehensive metabolic panel; Future  2. Hypertension associated with diabetes ([place]) Not at goal.  Pulse rate is too low to increase carvedilol so I will increase the isosorbide instead.  He will continue carvedilol 12.5 mg BID, hydralazine 100 mg 3 times a day and torsemide - isosorbide mononitrate (IMDUR) 120 MG 24 hr  tablet; Take 1 tablet (120 mg total) by mouth daily.  Dispense: 30 tablet; Refill: 6  3. Coronary artery disease of native artery of native heart with stable angina pectoris (HCC) Continue aspirin, atorvastatin, carvedilol.  Increase Imdur to 120 mg daily - isosorbide mononitrate (IMDUR) 120 MG 24 hr tablet; Take 1 tablet (120 mg total) by mouth daily.  Dispense: 30 tablet; Refill: 6  4. Chronic systolic congestive heart failure ([place]) Patient has chronic swelling in the legs but otherwise lungs are clear.  No significant weight changes.  He will continue torsemide, carvedilol, hydralazine and isosorbide  5. Stage 3b chronic kidney disease ([place]) Continue to monitor. - Comprehensive metabolic panel; Future  6. Primary insomnia Good sleep hygiene discussed and encouraged. Patient advised not to drink any caffeinated beverages or excessive alcohol use within several hours of bedtime.  Advised to get in bed around about the same time every night.  Once in bed, turn off all lights and sounds.  If unable to fall asleep within 30 to 45 minutes of getting in bed, patient should get up and try to do something until he feels sleepy again.  At that time try getting back in bed.  We will try him with low-dose of trazodone.  He will stop Tylenol PM  - traZODone (DESYREL) 50 MG tablet; Take 0.5 tablets (25 mg total) by mouth at bedtime.  Dispense: 30 tablet; Refill: 3  7. Need for shingles vaccine First Shingrix vaccine given today.     Patient was given the opportunity to ask questions.  Patient verbalized understanding of the plan and was able to repeat key elements of the plan.   This documentation was completed using Radio producer.  Any transcriptional errors are unintentional.  Orders Placed This Encounter  Procedures   Varicella-zoster vaccine IM   Comprehensive metabolic panel   POCT glucose (manual entry)   POCT glycosylated hemoglobin (Hb A1C)     Requested  Prescriptions   Signed Prescriptions Disp Refills   isosorbide mononitrate (IMDUR) 120 MG 24 hr tablet 30 tablet 6    Sig: Take 1 tablet (120 mg total) by mouth daily.   traZODone (DESYREL) 50 MG tablet 30 tablet  3    Sig: Take 0.5 tablets (25 mg total) by mouth at bedtime.    Return in about 4 months (around 07/27/2022).  Karle Plumber, MD, FACP

## 2022-04-03 ENCOUNTER — Encounter (INDEPENDENT_AMBULATORY_CARE_PROVIDER_SITE_OTHER): Payer: 59 | Admitting: Ophthalmology

## 2022-04-14 ENCOUNTER — Encounter (INDEPENDENT_AMBULATORY_CARE_PROVIDER_SITE_OTHER): Payer: 59 | Admitting: Ophthalmology

## 2022-04-17 ENCOUNTER — Encounter (INDEPENDENT_AMBULATORY_CARE_PROVIDER_SITE_OTHER): Payer: Self-pay | Admitting: Ophthalmology

## 2022-04-17 ENCOUNTER — Ambulatory Visit (INDEPENDENT_AMBULATORY_CARE_PROVIDER_SITE_OTHER): Payer: 59 | Admitting: Ophthalmology

## 2022-04-17 DIAGNOSIS — E113511 Type 2 diabetes mellitus with proliferative diabetic retinopathy with macular edema, right eye: Secondary | ICD-10-CM | POA: Diagnosis not present

## 2022-04-17 MED ORDER — BEVACIZUMAB CHEMO INJECTION 1.25MG/0.05ML SYRINGE FOR KALEIDOSCOPE
1.2500 mg | INTRAVITREAL | Status: AC | PRN
  Administered 2022-04-17: 1.25 mg via INTRAVITREAL

## 2022-04-17 NOTE — Progress Notes (Signed)
04/17/2022     CHIEF COMPLAINT Patient presents for  Chief Complaint  Patient presents with   Diabetic Retinopathy with Macular Edema      HISTORY OF PRESENT ILLNESS: Derek Lowery is a 66 y.o. male who presents to the clinic today for:   HPI   6 weeks scheduled follow-up but patient missed appointments now at 8 weeks, with blurred vision now OD dilate od avastin oct Pt states his vision has not been stable Pt denies floaters or but is seeing FOL Pt states his vision is hazy today Last edited by Hurman Horn, MD on 04/17/2022 11:14 AM.      Referring physician: Ladell Pier, MD [address] [place],  [place] [address]  HISTORICAL INFORMATION:   Selected notes from the MEDICAL RECORD NUMBER    Lab Results  Component Value Date   HGBA1C 6.2 03/27/2022     CURRENT MEDICATIONS: Current Outpatient Medications (Ophthalmic Drugs)  Medication Sig   brimonidine (ALPHAGAN) 0.2 % ophthalmic solution Place 1 drop into the right eye 2 (two) times daily.    dorzolamide (TRUSOPT) 2 % ophthalmic solution Place 1 drop into the right eye 2 (two) times daily.   latanoprost (XALATAN) 0.005 % ophthalmic solution Place 1 drop into the right eye at bedtime.   RHOPRESSA 0.02 % SOLN Place 1 drop into the right eye at bedtime.   timolol (TIMOPTIC) 0.5 % ophthalmic solution Place 1 drop into the right eye 2 (two) times daily.   No current facility-administered medications for this visit. (Ophthalmic Drugs)   Current Outpatient Medications (Other)  Medication Sig   acetaminophen (TYLENOL) 325 MG tablet Take 2 tablets (650 mg total) by mouth every 6 (six) hours as needed for mild pain (or Fever >/= 101).   ammonium lactate (AMLACTIN) 12 % lotion Apply to both feet twice daily for dry skin. (Patient taking differently: 1 application  as needed for dry skin. Apply to both feet twice daily for dry skin.)   aspirin EC 81 MG tablet Take 1 tablet (81 mg total) by mouth daily.    atorvastatin (LIPITOR) 40 MG tablet TAKE 1 TABLET EVERY DAY   BD PEN NEEDLE NANO 2ND GEN 32G X 4 MM MISC USE AS DIRECTED 3 TIMES A DAY. PLEASE SCHEDULE AN APPOINTMENT FOR ADDITIONAL REFILLS.   carvedilol (COREG) 12.5 MG tablet TAKE 1 TABLET (12.5MG TOTAL) BY MOUTH TWICE A DAY WITH MEALS   Continuous Blood Gluc Receiver (DEXCOM G6 RECEIVER) DEVI Use to check blood sugar four times daily. E11.42   Continuous Blood Gluc Sensor (DEXCOM G6 SENSOR) MISC Use to check blood sugar four times daily. E11.42   Continuous Blood Gluc Transmit (DEXCOM G6 TRANSMITTER) MISC Use to check blood sugar four times daily. E11.42   hydrALAZINE (APRESOLINE) 100 MG tablet TAKE 1 TABLET BY MOUTH THREE TIMES A DAY   insulin glargine (LANTUS) 100 UNIT/ML injection Inject 10 Units into the skin at bedtime.   insulin lispro (HUMALOG) 100 UNIT/ML injection Inject into the skin 4 (four) times daily as needed for high blood sugar (if glucose is >200 take 2-3 units after the meal).   isosorbide mononitrate (IMDUR) 120 MG 24 hr tablet Take 1 tablet (120 mg total) by mouth daily.   Lancets MISC Use as directed.  Accu chek 2   Multiple Vitamins-Minerals (MULTIVITAMIN WITH MINERALS) tablet Take 1 tablet by mouth daily.   torsemide (DEMADEX) 20 MG tablet TAKE 2 TABLETS (40MG) BY MOUTH IN  THE MORNING AND 1 TABLET (20MG) BY MOUTH IN THE EVENING.   traZODone (DESYREL) 50 MG tablet Take 0.5 tablets (25 mg total) by mouth at bedtime.   No current facility-administered medications for this visit. (Other)      REVIEW OF SYSTEMS: ROS   Negative for: Constitutional, Gastrointestinal, Neurological, Skin, Genitourinary, Musculoskeletal, HENT, Endocrine, Cardiovascular, Eyes, Respiratory, Psychiatric, Allergic/Imm, Heme/Lymph Last edited by Morene Rankins, CMA on 04/17/2022 10:11 AM.       ALLERGIES Allergies  Allergen Reactions   Empagliflozin Other (See Comments)    Four gangrene    PAST MEDICAL HISTORY Past Medical History:   Diagnosis Date   Arthritis    CAD (coronary artery disease)    NSTEMI 06/2011:  LHC 07/18/11: Proximal diagonal 60%, distal LAD with a diabetic appearance and 60% stenosis, OM2 with an occluded superior branch and an inferior branch with 90%, EF 55% with inferior hypokinesis.  PCI: Promus DES to the OM2 inferior branch.  This vessel provides collaterals to the superior branch which remained occluded.  Echocardiogram 07/18/11: EF 60%, normal wall motion.   Cataract    Essential hypertension, benign    Glaucoma    Hypercholesteremia    Internal hemorrhoids    Noncompliance    Type 2 diabetes mellitus ([place]) 1990   Past Surgical History:  Procedure Laterality Date   CATARACT EXTRACTION Right 2019   Dr. Katy Fitch   CHOLECYSTECTOMY N/A 09/21/2017   Procedure: LAPAROSCOPIC CHOLECYSTECTOMY WITH INTRAOPERATIVE CHOLANGIOGRAM;  Surgeon: Michael Boston, MD;  Location: [facility];  Service: General;  Laterality: N/A;   COLONOSCOPY  2000   Dr. Collene Mares   EYE SURGERY     IRRIGATION AND DEBRIDEMENT ABSCESS Left 05/05/2014   Procedure: IRRIGATION AND DEBRIDEMENT ABSCESS left buttock;  Surgeon: Michael Boston, MD;  Location: [facility];  Service: General;  Laterality: Left;   LEFT HEART CATHETERIZATION WITH CORONARY ANGIOGRAM N/A 07/18/2011   Procedure: LEFT HEART CATHETERIZATION WITH CORONARY ANGIOGRAM;  Surgeon: Hillary Bow, MD;  Location: [facility] CATH LAB;  Service: Cardiovascular;  Laterality: N/A;   PERCUTANEOUS CORONARY STENT INTERVENTION (PCI-S)  07/18/2011   Procedure: PERCUTANEOUS CORONARY STENT INTERVENTION (PCI-S);  Surgeon: Hillary Bow, MD;  Location: [facility] CATH LAB;  Service: Cardiovascular;;    FAMILY HISTORY Family History  Problem Relation Age of Onset   Coronary artery disease Father        Developed in his 32s   Kidney disease Father    Diabetes Father    Melanoma Father    Rectal cancer Father    Heart failure Mother    Hypertension Mother    Diabetes Sister    Diabetes Brother    Colon  cancer Neg Hx    Esophageal cancer Neg Hx    Stomach cancer Neg Hx     SOCIAL HISTORY Social History   Tobacco Use   Smoking status: Never   Smokeless tobacco: Never  Vaping Use   Vaping Use: Never used  Substance Use Topics   Alcohol use: No   Drug use: No         OPHTHALMIC EXAM:  Base Eye Exam     Visual Acuity (ETDRS)       Right Left   Dist [place] 20/50 +2 NLP         Tonometry (Tonopen, 10:14 AM)       Right Left   Pressure 9 10         Visual Fields       Left  Right     Full   Restrictions Total superior temporal, inferior temporal, superior nasal, inferior nasal deficiencies          Extraocular Movement       Right Left    Full Full         Neuro/Psych     Oriented x3: Yes   Mood/Affect: Normal         Dilation     Right eye: 2.5% Phenylephrine, 1.0% Mydriacyl @ 10:16 AM           Slit Lamp and Fundus Exam     External Exam       Right Left   External Normal Normal         Slit Lamp Exam       Right Left   Lids/Lashes Normal Normal   Conjunctiva/Sclera White and quiet, no exposure of tube, cystic conj covers tube well White and quiet   Cornea Clear Clear   Anterior Chamber Tube 12 meridian Deep   Iris NO new NVI large trunks, no signs of new  NVI, updrawn pupil, 3.5 mm, undilated Old neovascularization of the iris 360 degrees, stable nonprogressive   Lens Posterior chamber intraocular lens 3.5+ Nuclear sclerosis   Anterior Vitreous Normal          Fundus Exam       Right Left   Posterior Vitreous Posterior vitreous detachment    Disc Normal    C/D Ratio 0.7    Macula Microaneurysms, Retinal pigment epithelial atrophy, Macular thickening, Mild clinically significant macular edema    Vessels Proliferative diabetic retinopathy, quiet    Periphery  good panretinal photocoagulation, post recent PRP             IMAGING AND PROCEDURES  Imaging and Procedures for 04/17/22  OCT, Retina - OU - Both Eyes        Right Eye Quality was good. Scan locations included subfoveal. Central Foveal Thickness: 458. Progression has worsened. Findings include cystoid macular edema.   Notes Chronic CSME, stable overall with increase in CSME, currently at 6-week interval.  Thus proving this patient does require ongoing chronic suppressive therapy      Intravitreal Injection, Pharmacologic Agent - OD - Right Eye       Time Out 04/17/2022. 11:14 AM. Confirmed correct patient, procedure, site, and patient consented.   Anesthesia Topical anesthesia was used. Anesthetic medications included Lidocaine 4%.   Procedure Preparation included 5% betadine to ocular surface, 10% betadine to eyelids, Tobramycin 0.3%. A 30 gauge needle was used.   Injection: 1.25 mg Bevacizumab 1.86m/0.05ml   Route: Intravitreal, Site: Right Eye   NDC: 50242-060-01, Lot: [id number] Expiration date: 07/02/2022   Post-op Post injection exam found visual acuity of at least counting fingers. The patient tolerated the procedure well. There were no complications. The patient received written and verbal post procedure care education. Post injection medications included ocuflox.              ASSESSMENT/PLAN:  Proliferative diabetic retinopathy of right eye with macular edema associated with type 2 diabetes mellitus (HCC) Recurrent CSME OD.  At 8-week interval outpatient follow-up appointments have been missed.  Now repeat injection today and maintain 4 to 6-week interval as possible to maintain acuity.  No sign of recurrence of NVI however on clinical examination.     ICD-10-CM   1. Proliferative diabetic retinopathy of right eye with macular edema associated with type 2 diabetes mellitus ([place]  E11.3511 OCT, Retina -  OU - Both Eyes    Intravitreal Injection, Pharmacologic Agent - OD - Right Eye    Bevacizumab (AVASTIN) SOLN 1.25 mg      1.  OD with significant currents of CSME centrally at 8-week interval.  Repeat  injection Avastin today reevaluate next in 4 weeks.  Specifically 28 days  2.  3.  Ophthalmic Meds Ordered this visit:  Meds ordered this encounter  Medications   Bevacizumab (AVASTIN) SOLN 1.25 mg       Return in about 4 weeks (around 05/15/2022) for dilate, OD, AVASTIN OCT.  There are no Patient Instructions on file for this visit.   Explained the diagnoses, plan, and follow up with the patient and they expressed understanding.  Patient expressed understanding of the importance of proper follow up care.   Clent Demark Cecily Lawhorne M.D. Diseases & Surgery of the Retina and Vitreous Retina & Diabetic [place] 04/17/22     Abbreviations: M myopia (nearsighted); A astigmatism; H hyperopia (farsighted); P presbyopia; Mrx spectacle prescription;  CTL contact lenses; OD right eye; OS left eye; OU both eyes  XT exotropia; ET esotropia; PEK punctate epithelial keratitis; PEE punctate epithelial erosions; DES dry eye syndrome; MGD meibomian gland dysfunction; ATs artificial tears; PFAT's preservative free artificial tears; [place] nuclear sclerotic cataract; PSC posterior subcapsular cataract; ERM epi-retinal membrane; PVD posterior vitreous detachment; RD retinal detachment; DM diabetes mellitus; DR diabetic retinopathy; NPDR non-proliferative diabetic retinopathy; PDR proliferative diabetic retinopathy; CSME clinically significant macular edema; DME diabetic macular edema; dbh dot blot hemorrhages; CWS cotton wool spot; POAG primary open angle glaucoma; C/D cup-to-disc ratio; HVF humphrey visual field; GVF goldmann visual field; OCT optical coherence tomography; IOP intraocular pressure; BRVO Branch retinal vein occlusion; CRVO central retinal vein occlusion; CRAO central retinal artery occlusion; BRAO branch retinal artery occlusion; RT retinal tear; SB scleral buckle; PPV pars plana vitrectomy; VH Vitreous hemorrhage; PRP panretinal laser photocoagulation; IVK intravitreal kenalog; VMT vitreomacular  traction; MH Macular hole;  NVD neovascularization of the disc; NVE neovascularization elsewhere; AREDS age related eye disease study; ARMD age related macular degeneration; POAG primary open angle glaucoma; EBMD epithelial/anterior basement membrane dystrophy; ACIOL anterior chamber intraocular lens; IOL intraocular lens; PCIOL posterior chamber intraocular lens; Phaco/IOL phacoemulsification with intraocular lens placement; [place] photorefractive keratectomy; LASIK laser assisted in situ keratomileusis; HTN hypertension; DM diabetes mellitus; COPD chronic obstructive pulmonary disease

## 2022-04-17 NOTE — Assessment & Plan Note (Signed)
Recurrent CSME OD.  At 8-week interval outpatient follow-up appointments have been missed.  Now repeat injection today and maintain 4 to 6-week interval as possible to maintain acuity.  No sign of recurrence of NVI however on clinical examination.

## 2022-04-24 ENCOUNTER — Other Ambulatory Visit: Payer: Self-pay | Admitting: Internal Medicine

## 2022-04-24 DIAGNOSIS — E1142 Type 2 diabetes mellitus with diabetic polyneuropathy: Secondary | ICD-10-CM

## 2022-04-24 MED ORDER — DEXCOM G6 SENSOR MISC: 3 refills | Status: DC

## 2022-04-24 NOTE — Telephone Encounter (Signed)
Medication Refill - Medication:  Continuous Blood Gluc Sensor (DEXCOM G6 SENSOR) MISC 3 each  Pt had a bad sensor and now is out of sensors refill request asap  Has the patient contacted their pharmacy? Yes.   (Agent: If no, request that the patient contact the pharmacy for the refill. If patient does not wish to contact the pharmacy document the reason why and proceed with request.) (Agent: If yes, when and what did the pharmacy advise?) Pt needs to contact pcp for new script in order to have ins cover cost  Preferred Pharmacy (with phone number or street name):  [id number] [place] [id number] Phone: [phone]: [phone]  Has the patient been seen for an appointment in the last year OR does the patient have an upcoming appointment? Yes.   03/27/22  Agent: Please be advised that RX refills may take up to 3 business days. We ask that you follow-up with your pharmacy.

## 2022-04-24 NOTE — Telephone Encounter (Signed)
Requested Prescriptions  Pending Prescriptions Disp Refills  . Continuous Blood Gluc Sensor (DEXCOM G6 SENSOR) MISC 3 each 3    Sig: Use to check blood sugar four times daily. E11.42     Endocrinology: Diabetes - Testing Supplies Passed - 04/24/2022  3:14 PM      Passed - Valid encounter within last 12 months    Recent Outpatient Visits          4 weeks ago Type 2 diabetes mellitus with diabetic polyneuropathy, with long-term current use of insulin ([place])   [place] Ladell Pier, MD   6 months ago Hospital discharge follow-up   [place], MD   10 months ago Type 2 diabetes mellitus with diabetic polyneuropathy, with long-term current use of insulin [facility])   [place], Deborah B, MD   1 year ago Type 2 diabetes mellitus with diabetic polyneuropathy, with long-term current use of insulin [facility])   [place], Deborah B, MD   1 year ago Hospital discharge follow-up   [place], Deborah B, MD      Future Appointments            In 2 months Minus Breeding, MD [place]. [place]   In 3 months Wynetta Emery, Dalbert Batman, MD [place]

## 2022-05-05 ENCOUNTER — Other Ambulatory Visit: Payer: Self-pay | Admitting: Internal Medicine

## 2022-05-05 DIAGNOSIS — Z794 Long term (current) use of insulin: Secondary | ICD-10-CM

## 2022-05-05 DIAGNOSIS — I5032 Chronic diastolic (congestive) heart failure: Secondary | ICD-10-CM

## 2022-05-05 DIAGNOSIS — E1142 Type 2 diabetes mellitus with diabetic polyneuropathy: Secondary | ICD-10-CM

## 2022-05-06 NOTE — Telephone Encounter (Signed)
Refilled 12/31/2021 #270 1 refill. Requested Prescriptions  Pending Prescriptions Disp Refills  . torsemide (DEMADEX) 20 MG tablet [Pharmacy Med Name: TORSEMIDE 20 MG TABLET] 270 tablet 1    Sig: TAKE 2 TABLETS (40MG) BY MOUTH IN THE MORNING AND 1 TABLET (20MG) BY MOUTH IN THE EVENING.     Cardiovascular:  Diuretics - Loop Failed - 05/05/2022 11:11 PM      Failed - Na in normal range and within 180 days    Sodium  Date Value Ref Range Status  01/10/2022 145 (H) 134 - 144 mmol/L Final         Failed - Cr in normal range and within 180 days    Creat  Date Value Ref Range Status  09/02/2016 0.80 0.70 - 1.33 mg/dL Final    Comment:      For patients > or = 64 years of age: The upper reference limit for Creatinine is approximately 13% higher for people identified as African-American.      Creatinine, Ser  Date Value Ref Range Status  01/10/2022 1.86 (H) 0.76 - 1.27 mg/dL Final   Creatinine, Urine  Date Value Ref Range Status  06/30/2020 97.16 mg/dL Final    Comment:    Performed at [facility] [address], [place], [place] [address]         Failed - Mg Level in normal range and within 180 days    Magnesium  Date Value Ref Range Status  09/15/2021 2.2 1.7 - 2.4 mg/dL Final    Comment:    Performed at [facility], [place] [address] [place], [place] [address]         Failed - Last BP in normal range    BP Readings from Last 1 Encounters:  03/27/22 (!) 165/74         Passed - K in normal range and within 180 days    Potassium  Date Value Ref Range Status  01/10/2022 3.9 3.5 - 5.2 mmol/L Final         Passed - Ca in normal range and within 180 days    Calcium  Date Value Ref Range Status  01/10/2022 9.1 8.6 - 10.2 mg/dL Final   Calcium, Ion  Date Value Ref Range Status  09/08/2021 1.08 (L) 1.15 - 1.40 mmol/L Final         Passed - Cl in normal range and within 180 days    Chloride  Date Value Ref Range Status  01/10/2022 103 96 - 106 mmol/L  Final         Passed - Valid encounter within last 6 months    Recent Outpatient Visits          1 month ago Type 2 diabetes mellitus with diabetic polyneuropathy, with long-term current use of insulin ([place])   [place] Ladell Pier, MD   6 months ago Hospital discharge follow-up   [place] Karle Plumber B, MD   10 months ago Type 2 diabetes mellitus with diabetic polyneuropathy, with long-term current use of insulin [facility])   [place] Karle Plumber B, MD   1 year ago Type 2 diabetes mellitus with diabetic polyneuropathy, with long-term current use of insulin [facility])   [place] Ladell Pier, MD   1 year ago Hospital discharge follow-up   [place], Deborah B, MD  Future Appointments            In 2 months Minus Breeding, MD [place] [facility] [place]. [place]   In 2 months Wynetta Emery, Dalbert Batman, MD [place]           . HUMALOG KWIKPEN 100 UNIT/ML KwikPen [Pharmacy Med Name: HUMALOG 100 UNIT/ML KWIKPEN]  3    Sig: INJECT 10 UNITS INTO THE SKIN 3 (THREE) TIMES DAILY WITH MEALS.     Endocrinology:  Diabetes - Insulins Passed - 05/05/2022 11:11 PM      Passed - HBA1C is between 0 and 7.9 and within 180 days    HbA1c, POC (controlled diabetic range)  Date Value Ref Range Status  03/27/2022 6.2 0.0 - 7.0 % Final         Passed - Valid encounter within last 6 months    Recent Outpatient Visits          1 month ago Type 2 diabetes mellitus with diabetic polyneuropathy, with long-term current use of insulin ([place])   [place] Ladell Pier, MD   6 months ago Hospital discharge follow-up   [place], MD   10 months ago Type 2 diabetes mellitus with  diabetic polyneuropathy, with long-term current use of insulin [place])   [place], Deborah B, MD   1 year ago Type 2 diabetes mellitus with diabetic polyneuropathy, with long-term current use of insulin [facility])   [place] Ladell Pier, MD   1 year ago Hospital discharge follow-up   [place], Deborah B, MD      Future Appointments            In 2 months Minus Breeding, MD [place]. [place]   In 2 months Wynetta Emery, Dalbert Batman, MD [place]

## 2022-05-06 NOTE — Telephone Encounter (Signed)
Requested medications are due for refill today.  unisre  Requested medications are on the active medications list.  Yes as historical  Last refill.   Future visit scheduled.   yes  Notes to clinic.  Medication was discontinued 09/16/2021 - historical medication.    Requested Prescriptions  Pending Prescriptions Disp Refills   HUMALOG KWIKPEN 100 UNIT/ML KwikPen [Pharmacy Med Name: HUMALOG 100 UNIT/ML KWIKPEN]  3    Sig: INJECT 10 UNITS INTO THE SKIN 3 (THREE) TIMES DAILY WITH MEALS.     Endocrinology:  Diabetes - Insulins Passed - 05/05/2022 11:11 PM      Passed - HBA1C is between 0 and 7.9 and within 180 days    HbA1c, POC (controlled diabetic range)  Date Value Ref Range Status  03/27/2022 6.2 0.0 - 7.0 % Final         Passed - Valid encounter within last 6 months    Recent Outpatient Visits           1 month ago Type 2 diabetes mellitus with diabetic polyneuropathy, with long-term current use of insulin ([place])   [place] Ladell Pier, MD   6 months ago Hospital discharge follow-up   [place], MD   10 months ago Type 2 diabetes mellitus with diabetic polyneuropathy, with long-term current use of insulin [facility])   [place], Deborah B, MD   1 year ago Type 2 diabetes mellitus with diabetic polyneuropathy, with long-term current use of insulin [facility])   [place] Ladell Pier, MD   1 year ago Hospital discharge follow-up   [place], Deborah B, MD       Future Appointments             In 2 months Minus Breeding, MD [place]. [place]   In 2 months Ladell Pier, MD [place]            Refused Prescriptions Disp Refills   torsemide (DEMADEX) 20 MG tablet [Pharmacy Med Name:  TORSEMIDE 20 MG TABLET] 270 tablet 1    Sig: TAKE 2 TABLETS (40MG) BY MOUTH IN THE MORNING AND 1 TABLET (20MG) BY MOUTH IN THE EVENING.     Cardiovascular:  Diuretics - Loop Failed - 05/05/2022 11:11 PM      Failed - Na in normal range and within 180 days    Sodium  Date Value Ref Range Status  01/10/2022 145 (H) 134 - 144 mmol/L Final         Failed - Cr in normal range and within 180 days    Creat  Date Value Ref Range Status  09/02/2016 0.80 0.70 - 1.33 mg/dL Final    Comment:      For patients > or = 64 years of age: The upper reference limit for Creatinine is approximately 13% higher for people identified as African-American.      Creatinine, Ser  Date Value Ref Range Status  01/10/2022 1.86 (H) 0.76 - 1.27 mg/dL Final   Creatinine, Urine  Date Value Ref Range Status  06/30/2020 97.16 mg/dL Final    Comment:    Performed at [facility] [address], [place], [place] [address]         Failed - Mg Level in normal  range and within 180 days    Magnesium  Date Value Ref Range Status  09/15/2021 2.2 1.7 - 2.4 mg/dL Final    Comment:    Performed at [facility], [place] [address], [place], [place] [address]         Failed - Last BP in normal range    BP Readings from Last 1 Encounters:  03/27/22 (!) 165/74         Passed - K in normal range and within 180 days    Potassium  Date Value Ref Range Status  01/10/2022 3.9 3.5 - 5.2 mmol/L Final         Passed - Ca in normal range and within 180 days    Calcium  Date Value Ref Range Status  01/10/2022 9.1 8.6 - 10.2 mg/dL Final   Calcium, Ion  Date Value Ref Range Status  09/08/2021 1.08 (L) 1.15 - 1.40 mmol/L Final         Passed - Cl in normal range and within 180 days    Chloride  Date Value Ref Range Status  01/10/2022 103 96 - 106 mmol/L Final         Passed - Valid encounter within last 6 months    Recent Outpatient Visits           1 month ago Type 2 diabetes mellitus with diabetic  polyneuropathy, with long-term current use of insulin ([place])   [place] Ladell Pier, MD   6 months ago Hospital discharge follow-up   [place], MD   10 months ago Type 2 diabetes mellitus with diabetic polyneuropathy, with long-term current use of insulin [facility])   [place], Deborah B, MD   1 year ago Type 2 diabetes mellitus with diabetic polyneuropathy, with long-term current use of insulin [facility])   [place] Ladell Pier, MD   1 year ago Hospital discharge follow-up   [place], MD       Future Appointments             In 2 months Minus Breeding, MD [place]. [place]   In 2 months Wynetta Emery, Dalbert Batman, MD [place]

## 2022-05-15 ENCOUNTER — Encounter (INDEPENDENT_AMBULATORY_CARE_PROVIDER_SITE_OTHER): Payer: Self-pay | Admitting: Ophthalmology

## 2022-05-15 ENCOUNTER — Ambulatory Visit (INDEPENDENT_AMBULATORY_CARE_PROVIDER_SITE_OTHER): Payer: 59 | Admitting: Ophthalmology

## 2022-05-15 DIAGNOSIS — H4051X3 Glaucoma secondary to other eye disorders, right eye, severe stage: Secondary | ICD-10-CM

## 2022-05-15 DIAGNOSIS — E113511 Type 2 diabetes mellitus with proliferative diabetic retinopathy with macular edema, right eye: Secondary | ICD-10-CM

## 2022-05-15 MED ORDER — BEVACIZUMAB CHEMO INJECTION 1.25MG/0.05ML SYRINGE FOR KALEIDOSCOPE
2.5000 mg | INTRAVITREAL | Status: AC | PRN
  Administered 2022-05-15: 2.5 mg via INTRAVITREAL

## 2022-05-15 NOTE — Patient Instructions (Signed)
Patient request to return again in 6 weeks.  Patient informed that with the change in ownership from [facility] to Dr. [name] only in the practice there is also a change in credentialing.  This  Specifically for his matters, it requires him to call 2 weeks prior to next visit to confirm participation with [place] [employer] Medicare at that time.  If no  participation has arrived that time, Dr. [name] will assist the patient to have arrangements for an evaluation and injection in another location with similar capabilities.  Patient nodded his affirmation that he will call 2 weeks prior to his next visit

## 2022-05-15 NOTE — Progress Notes (Signed)
05/15/2022     CHIEF COMPLAINT Patient presents for  Chief Complaint  Patient presents with   Diabetic Retinopathy with Macular Edema      HISTORY OF PRESENT ILLNESS: Derek Lowery is a 66 y.o. male who presents to the clinic today for:   HPI   Diabetic retinopathy of right eye with macular edema,  and history of ANTERIOR SEGMENT ISCHEMIA, controlled with antivegf Q 6 weeks in recent years.   4 weeks dilate od avastin oct Pt states his vision has been stable Pt denies any new floaters or FOL  Last edited by Hurman Horn, MD on 05/15/2022 10:51 AM.      Referring physician: Ladell Pier, MD [address] [place],  [place] [address]  HISTORICAL INFORMATION:   Selected notes from the MEDICAL RECORD NUMBER    Lab Results  Component Value Date   HGBA1C 6.2 03/27/2022     CURRENT MEDICATIONS: Current Outpatient Medications (Ophthalmic Drugs)  Medication Sig   brimonidine (ALPHAGAN) 0.2 % ophthalmic solution Place 1 drop into the right eye 2 (two) times daily.    dorzolamide (TRUSOPT) 2 % ophthalmic solution Place 1 drop into the right eye 2 (two) times daily.   latanoprost (XALATAN) 0.005 % ophthalmic solution Place 1 drop into the right eye at bedtime.   RHOPRESSA 0.02 % SOLN Place 1 drop into the right eye at bedtime.   timolol (TIMOPTIC) 0.5 % ophthalmic solution Place 1 drop into the right eye 2 (two) times daily.   No current facility-administered medications for this visit. (Ophthalmic Drugs)   Current Outpatient Medications (Other)  Medication Sig   acetaminophen (TYLENOL) 325 MG tablet Take 2 tablets (650 mg total) by mouth every 6 (six) hours as needed for mild pain (or Fever >/= 101).   ammonium lactate (AMLACTIN) 12 % lotion Apply to both feet twice daily for dry skin. (Patient taking differently: 1 application  as needed for dry skin. Apply to both feet twice daily for dry skin.)   aspirin EC 81 MG tablet Take 1 tablet (81 mg total) by  mouth daily.   atorvastatin (LIPITOR) 40 MG tablet TAKE 1 TABLET EVERY DAY   BD PEN NEEDLE NANO 2ND GEN 32G X 4 MM MISC USE AS DIRECTED 3 TIMES A DAY. PLEASE SCHEDULE AN APPOINTMENT FOR ADDITIONAL REFILLS.   carvedilol (COREG) 12.5 MG tablet TAKE 1 TABLET (12.5MG TOTAL) BY MOUTH TWICE A DAY WITH MEALS   Continuous Blood Gluc Receiver (DEXCOM G6 RECEIVER) DEVI Use to check blood sugar four times daily. E11.42   Continuous Blood Gluc Sensor (DEXCOM G6 SENSOR) MISC Use to check blood sugar four times daily. E11.42   Continuous Blood Gluc Transmit (DEXCOM G6 TRANSMITTER) MISC Use to check blood sugar four times daily. E11.42   hydrALAZINE (APRESOLINE) 100 MG tablet TAKE 1 TABLET BY MOUTH THREE TIMES A DAY   insulin glargine (LANTUS) 100 UNIT/ML injection Inject 10 Units into the skin at bedtime.   insulin lispro (HUMALOG KWIKPEN) 100 UNIT/ML KwikPen INJECT 10 UNITS INTO THE SKIN 3 (THREE) TIMES DAILY WITH MEALS.   isosorbide mononitrate (IMDUR) 120 MG 24 hr tablet Take 1 tablet (120 mg total) by mouth daily.   Lancets MISC Use as directed.  Accu chek 2   Multiple Vitamins-Minerals (MULTIVITAMIN WITH MINERALS) tablet Take 1 tablet by mouth daily.   torsemide (DEMADEX) 20 MG tablet TAKE 2 TABLETS (40MG) BY MOUTH IN THE MORNING AND 1 TABLET (20MG) BY MOUTH  IN THE EVENING.   traZODone (DESYREL) 50 MG tablet Take 0.5 tablets (25 mg total) by mouth at bedtime.   No current facility-administered medications for this visit. (Other)      REVIEW OF SYSTEMS: ROS   Negative for: Constitutional, Gastrointestinal, Neurological, Skin, Genitourinary, Musculoskeletal, HENT, Endocrine, Cardiovascular, Eyes, Respiratory, Psychiatric, Allergic/Imm, Heme/Lymph Last edited by Morene Rankins, CMA on 05/15/2022  9:50 AM.       ALLERGIES Allergies  Allergen Reactions   Empagliflozin Other (See Comments)    Four gangrene    PAST MEDICAL HISTORY Past Medical History:  Diagnosis Date   Arthritis    CAD  (coronary artery disease)    NSTEMI 06/2011:  LHC 07/18/11: Proximal diagonal 60%, distal LAD with a diabetic appearance and 60% stenosis, OM2 with an occluded superior branch and an inferior branch with 90%, EF 55% with inferior hypokinesis.  PCI: Promus DES to the OM2 inferior branch.  This vessel provides collaterals to the superior branch which remained occluded.  Echocardiogram 07/18/11: EF 60%, normal wall motion.   Cataract    Essential hypertension, benign    Glaucoma    Hypercholesteremia    Internal hemorrhoids    Noncompliance    Type 2 diabetes mellitus ([place]) 1990   Past Surgical History:  Procedure Laterality Date   CATARACT EXTRACTION Right 2019   Dr. Katy Fitch   CHOLECYSTECTOMY N/A 09/21/2017   Procedure: LAPAROSCOPIC CHOLECYSTECTOMY WITH INTRAOPERATIVE CHOLANGIOGRAM;  Surgeon: Michael Boston, MD;  Location: [facility];  Service: General;  Laterality: N/A;   COLONOSCOPY  2000   Dr. Collene Mares   EYE SURGERY     IRRIGATION AND DEBRIDEMENT ABSCESS Left 05/05/2014   Procedure: IRRIGATION AND DEBRIDEMENT ABSCESS left buttock;  Surgeon: Michael Boston, MD;  Location: [facility];  Service: General;  Laterality: Left;   LEFT HEART CATHETERIZATION WITH CORONARY ANGIOGRAM N/A 07/18/2011   Procedure: LEFT HEART CATHETERIZATION WITH CORONARY ANGIOGRAM;  Surgeon: Hillary Bow, MD;  Location[facility] CATH LAB;  Service: Cardiovascular;  Laterality: N/A;   PERCUTANEOUS CORONARY STENT INTERVENTION (PCI-S)  07/18/2011   Procedure: PERCUTANEOUS CORONARY STENT INTERVENTION (PCI-S);  Surgeon: Hillary Bow, MD;  Location: [facility] CATH LAB;  Service: Cardiovascular;;    FAMILY HISTORY Family History  Problem Relation Age of Onset   Coronary artery disease Father        Developed in his 63s   Kidney disease Father    Diabetes Father    Melanoma Father    Rectal cancer Father    Heart failure Mother    Hypertension Mother    Diabetes Sister    Diabetes Brother    Colon cancer Neg Hx    Esophageal cancer Neg Hx     Stomach cancer Neg Hx     SOCIAL HISTORY Social History   Tobacco Use   Smoking status: Never   Smokeless tobacco: Never  Vaping Use   Vaping Use: Never used  Substance Use Topics   Alcohol use: No   Drug use: No         OPHTHALMIC EXAM:  Base Eye Exam     Visual Acuity (ETDRS)       Right Left   Dist [place] 20/25 -1 NLP         Tonometry (Tonopen, 9:53 AM)       Right Left   Pressure 5 14         Visual Fields       Left Right   Restrictions Total superior temporal,  inferior temporal, superior nasal, inferior nasal deficiencies          Neuro/Psych     Oriented x3: Yes   Mood/Affect: Normal         Dilation     Right eye: 1.0% Mydriacyl, 2.5% Phenylephrine @ 9:51 AM           Slit Lamp and Fundus Exam     External Exam       Right Left   External Normal Normal         Slit Lamp Exam       Right Left   Lids/Lashes Normal Normal   Conjunctiva/Sclera White and quiet, no exposure of tube, cystic conj covers tube well White and quiet   Cornea Clear Clear   Anterior Chamber Tube 12 meridian Deep   Iris NO new NVI large trunks, no signs of new  NVI, updrawn pupil, 3.5 mm, undilated Old neovascularization of the iris 360 degrees, stable nonprogressive   Lens Posterior chamber intraocular lens 3.5+ Nuclear sclerosis   Anterior Vitreous Normal          Fundus Exam       Right Left   Posterior Vitreous Posterior vitreous detachment    Disc Normal    C/D Ratio 0.7    Macula Microaneurysms, Retinal pigment epithelial atrophy, Macular thickening, Mild clinically significant macular edema    Vessels Proliferative diabetic retinopathy, quiet    Periphery  good panretinal photocoagulation, post recent PRP             IMAGING AND PROCEDURES  Imaging and Procedures for 05/15/22  OCT, Retina - OU - Both Eyes       Right Eye Quality was good. Scan locations included subfoveal. Central Foveal Thickness: 254. Progression has  improved. Findings include cystoid macular edema.   Notes Much improved CSME currently at 4-week interval examination and injection Avastin..  Thus proving this patient does require ongoing chronic suppressive therapy      Intravitreal Injection, Pharmacologic Agent - OD - Right Eye       Time Out 05/15/2022. 10:55 AM. Confirmed correct patient, procedure, site, and patient consented.   Anesthesia Topical anesthesia was used. Anesthetic medications included Lidocaine 4%.   Procedure Preparation included 5% betadine to ocular surface, 10% betadine to eyelids, Tobramycin 0.3%. A 30 gauge needle was used.   Injection: 2.5 mg Bevacizumab 1.22m/0.05ml   Route: Intravitreal, Site: Right Eye   NDC: 50242-060-01   Post-op Post injection exam found visual acuity of at least counting fingers. The patient tolerated the procedure well. There were no complications. The patient received written and verbal post procedure care education. Post injection medications included ocuflox.              ASSESSMENT/PLAN:  Proliferative diabetic retinopathy of right eye with macular edema associated with type 2 diabetes mellitus (HCC) Much improved macular condition less macular edema and no sign of neovascularization progression.  Improved acuity as well postinjection some 4 weeks previous.  Repeat injection today reevaluate next in 6 weeks  Neovascular glaucoma, right eye, severe stage Also controlled on antivegF     ICD-10-CM   1. Proliferative diabetic retinopathy of right eye with macular edema associated with type 2 diabetes mellitus (HCC)  E11.3511 OCT, Retina - OU - Both Eyes    Intravitreal Injection, Pharmacologic Agent - OD - Right Eye    Bevacizumab (AVASTIN) SOLN 2.5 mg    2. Neovascular glaucoma, right eye, severe stage  H40.51X3  1.  OD vastly improved macular edema 4 weeks post recent injection.  Improved clinical findings features and OCT as well as symptomatic  improvement in acuity  2.  Repeat Avastin OD today reevaluate next in 6 weeks.  3.  He understands he may be referred to a similar retina specialist if [place] healthcare participation has not commenced  Ophthalmic Meds Ordered this visit:  Meds ordered this encounter  Medications   Bevacizumab (AVASTIN) SOLN 2.5 mg       Return in about 6 weeks (around 06/26/2022) for dilate, OD, AVASTIN OCT.  Patient Instructions  Patient request to return again in 6 weeks.  Patient informed that with the change in ownership from [place] [facility] to Dr. [name] only in the practice there is also a change in credentialing.  This  Specifically for his matters, it requires him to call 2 weeks prior to next visit to confirm participation with [place] healthcare Medicare at that time.  If no  participation has arrived that time, Dr. [name] will assist the patient to have arrangements for an evaluation and injection in another location with similar capabilities.  Patient nodded his affirmation that he will call 2 weeks prior to his next visit    Explained the diagnoses, plan, and follow up with the patient and they expressed understanding.  Patient expressed understanding of the importance of proper follow up care.   Clent Demark Malik Ruffino M.D. Diseases & Surgery of the Retina and Vitreous Retina & Diabetic [place] 05/15/22     Abbreviations: M myopia (nearsighted); A astigmatism; H hyperopia (farsighted); P presbyopia; Mrx spectacle prescription;  CTL contact lenses; OD right eye; OS left eye; OU both eyes  XT exotropia; ET esotropia; PEK punctate epithelial keratitis; PEE punctate epithelial erosions; DES dry eye syndrome; MGD meibomian gland dysfunction; ATs artificial tears; PFAT's preservative free artificial tears; [place] nuclear sclerotic cataract; PSC posterior subcapsular cataract; ERM epi-retinal membrane; PVD posterior vitreous detachment; RD retinal detachment; DM diabetes mellitus; DR  diabetic retinopathy; NPDR non-proliferative diabetic retinopathy; PDR proliferative diabetic retinopathy; CSME clinically significant macular edema; DME diabetic macular edema; dbh dot blot hemorrhages; CWS cotton wool spot; POAG primary open angle glaucoma; C/D cup-to-disc ratio; HVF humphrey visual field; GVF goldmann visual field; OCT optical coherence tomography; IOP intraocular pressure; BRVO Branch retinal vein occlusion; CRVO central retinal vein occlusion; CRAO central retinal artery occlusion; BRAO branch retinal artery occlusion; RT retinal tear; SB scleral buckle; PPV pars plana vitrectomy; VH Vitreous hemorrhage; PRP panretinal laser photocoagulation; IVK intravitreal kenalog; VMT vitreomacular traction; MH Macular hole;  NVD neovascularization of the disc; NVE neovascularization elsewhere; AREDS age related eye disease study; ARMD age related macular degeneration; POAG primary open angle glaucoma; EBMD epithelial/anterior basement membrane dystrophy; ACIOL anterior chamber intraocular lens; IOL intraocular lens; PCIOL posterior chamber intraocular lens; Phaco/IOL phacoemulsification with intraocular lens placement; PRK photorefractive keratectomy; LASIK laser assisted in situ keratomileusis; HTN hypertension; DM diabetes mellitus; COPD chronic obstructive pulmonary disease   05/15/2022     CHIEF COMPLAINT Patient presents for  Chief Complaint  Patient presents with   Diabetic Retinopathy with Macular Edema      HISTORY OF PRESENT ILLNESS: Derek Lowery is a 66 y.o. male who presents to the clinic today for:   HPI   Diabetic retinopathy of right eye with macular edema,  and history of ANTERIOR SEGMENT ISCHEMIA, controlled with antivegf Q 6 weeks in recent years.   4 weeks dilate od avastin oct Pt states his vision has been  stable Pt denies any new floaters or FOL  Last edited by Hurman Horn, MD on 05/15/2022 10:51 AM.      Referring physician: Ladell Pier,  MD [address],  [place] [address]  HISTORICAL INFORMATION:   Selected notes from the MEDICAL RECORD NUMBER    Lab Results  Component Value Date   HGBA1C 6.2 03/27/2022     CURRENT MEDICATIONS: Current Outpatient Medications (Ophthalmic Drugs)  Medication Sig   brimonidine (ALPHAGAN) 0.2 % ophthalmic solution Place 1 drop into the right eye 2 (two) times daily.    dorzolamide (TRUSOPT) 2 % ophthalmic solution Place 1 drop into the right eye 2 (two) times daily.   latanoprost (XALATAN) 0.005 % ophthalmic solution Place 1 drop into the right eye at bedtime.   RHOPRESSA 0.02 % SOLN Place 1 drop into the right eye at bedtime.   timolol (TIMOPTIC) 0.5 % ophthalmic solution Place 1 drop into the right eye 2 (two) times daily.   No current facility-administered medications for this visit. (Ophthalmic Drugs)   Current Outpatient Medications (Other)  Medication Sig   acetaminophen (TYLENOL) 325 MG tablet Take 2 tablets (650 mg total) by mouth every 6 (six) hours as needed for mild pain (or Fever >/= 101).   ammonium lactate (AMLACTIN) 12 % lotion Apply to both feet twice daily for dry skin. (Patient taking differently: 1 application  as needed for dry skin. Apply to both feet twice daily for dry skin.)   aspirin EC 81 MG tablet Take 1 tablet (81 mg total) by mouth daily.   atorvastatin (LIPITOR) 40 MG tablet TAKE 1 TABLET EVERY DAY   BD PEN NEEDLE NANO 2ND GEN 32G X 4 MM MISC USE AS DIRECTED 3 TIMES A DAY. PLEASE SCHEDULE AN APPOINTMENT FOR ADDITIONAL REFILLS.   carvedilol (COREG) 12.5 MG tablet TAKE 1 TABLET (12.5MG TOTAL) BY MOUTH TWICE A DAY WITH MEALS   Continuous Blood Gluc Receiver (DEXCOM G6 RECEIVER) DEVI Use to check blood sugar four times daily. E11.42   Continuous Blood Gluc Sensor (DEXCOM G6 SENSOR) MISC Use to check blood sugar four times daily. E11.42   Continuous Blood Gluc Transmit (DEXCOM G6 TRANSMITTER) MISC Use to check blood sugar four times daily. E11.42    hydrALAZINE (APRESOLINE) 100 MG tablet TAKE 1 TABLET BY MOUTH THREE TIMES A DAY   insulin glargine (LANTUS) 100 UNIT/ML injection Inject 10 Units into the skin at bedtime.   insulin lispro (HUMALOG KWIKPEN) 100 UNIT/ML KwikPen INJECT 10 UNITS INTO THE SKIN 3 (THREE) TIMES DAILY WITH MEALS.   isosorbide mononitrate (IMDUR) 120 MG 24 hr tablet Take 1 tablet (120 mg total) by mouth daily.   Lancets MISC Use as directed.  Accu chek 2   Multiple Vitamins-Minerals (MULTIVITAMIN WITH MINERALS) tablet Take 1 tablet by mouth daily.   torsemide (DEMADEX) 20 MG tablet TAKE 2 TABLETS (40MG) BY MOUTH IN THE MORNING AND 1 TABLET (20MG) BY MOUTH IN THE EVENING.   traZODone (DESYREL) 50 MG tablet Take 0.5 tablets (25 mg total) by mouth at bedtime.   No current facility-administered medications for this visit. (Other)      REVIEW OF SYSTEMS: ROS   Negative for: Constitutional, Gastrointestinal, Neurological, Skin, Genitourinary, Musculoskeletal, HENT, Endocrine, Cardiovascular, Eyes, Respiratory, Psychiatric, Allergic/Imm, Heme/Lymph Last edited by Morene Rankins, CMA on 05/15/2022  9:50 AM.       ALLERGIES Allergies  Allergen Reactions   Empagliflozin Other (See Comments)    Four gangrene  PAST MEDICAL HISTORY Past Medical History:  Diagnosis Date   Arthritis    CAD (coronary artery disease)    NSTEMI 06/2011:  LHC 07/18/11: Proximal diagonal 60%, distal LAD with a diabetic appearance and 60% stenosis, OM2 with an occluded superior branch and an inferior branch with 90%, EF 55% with inferior hypokinesis.  PCI: Promus DES to the OM2 inferior branch.  This vessel provides collaterals to the superior branch which remained occluded.  Echocardiogram 07/18/11: EF 60%, normal wall motion.   Cataract    Essential hypertension, benign    Glaucoma    Hypercholesteremia    Internal hemorrhoids    Noncompliance    Type 2 diabetes mellitus ([place]) 1990   Past Surgical History:  Procedure Laterality  Date   CATARACT EXTRACTION Right 2019   Dr. Katy Fitch   CHOLECYSTECTOMY N/A 09/21/2017   Procedure: LAPAROSCOPIC CHOLECYSTECTOMY WITH INTRAOPERATIVE CHOLANGIOGRAM;  Surgeon: Michael Boston, MD;  Location: [facility];  Service: General;  Laterality: N/A;   COLONOSCOPY  2000   Dr. Collene Mares   EYE SURGERY     IRRIGATION AND DEBRIDEMENT ABSCESS Left 05/05/2014   Procedure: IRRIGATION AND DEBRIDEMENT ABSCESS left buttock;  Surgeon: Michael Boston, MD;  Location: [facility];  Service: General;  Laterality: Left;   LEFT HEART CATHETERIZATION WITH CORONARY ANGIOGRAM N/A 07/18/2011   Procedure: LEFT HEART CATHETERIZATION WITH CORONARY ANGIOGRAM;  Surgeon: Hillary Bow, MD;  Location:  [facility] CATH LAB;  Service: Cardiovascular;  Laterality: N/A;   PERCUTANEOUS CORONARY STENT INTERVENTION (PCI-S)  07/18/2011   Procedure: PERCUTANEOUS CORONARY STENT INTERVENTION (PCI-S);  Surgeon: Hillary Bow, MD;  Location: [facility] CATH LAB;  Service: Cardiovascular;;    FAMILY HISTORY Family History  Problem Relation Age of Onset   Coronary artery disease Father        Developed in his 25s   Kidney disease Father    Diabetes Father    Melanoma Father    Rectal cancer Father    Heart failure Mother    Hypertension Mother    Diabetes Sister    Diabetes Brother    Colon cancer Neg Hx    Esophageal cancer Neg Hx    Stomach cancer Neg Hx     SOCIAL HISTORY Social History   Tobacco Use   Smoking status: Never   Smokeless tobacco: Never  Vaping Use   Vaping Use: Never used  Substance Use Topics   Alcohol use: No   Drug use: No         OPHTHALMIC EXAM:  Base Eye Exam     Visual Acuity (ETDRS)       Right Left   Dist [place] 20/25 -1 NLP         Tonometry (Tonopen, 9:53 AM)       Right Left   Pressure 5 14         Visual Fields       Left Right   Restrictions Total superior temporal, inferior temporal, superior nasal, inferior nasal deficiencies          Neuro/Psych     Oriented x3: Yes    Mood/Affect: Normal         Dilation     Right eye: 1.0% Mydriacyl, 2.5% Phenylephrine @ 9:51 AM           Slit Lamp and Fundus Exam     External Exam       Right Left   External Normal Normal         Slit Lamp  Exam       Right Left   Lids/Lashes Normal Normal   Conjunctiva/Sclera White and quiet, no exposure of tube, cystic conj covers tube well White and quiet   Cornea Clear Clear   Anterior Chamber Tube 12 meridian Deep   Iris NO new NVI large trunks, no signs of new  NVI, updrawn pupil, 3.5 mm, undilated Old neovascularization of the iris 360 degrees, stable nonprogressive   Lens Posterior chamber intraocular lens 3.5+ Nuclear sclerosis   Anterior Vitreous Normal          Fundus Exam       Right Left   Posterior Vitreous Posterior vitreous detachment    Disc Normal    C/D Ratio 0.7    Macula Microaneurysms, Retinal pigment epithelial atrophy, Macular thickening, Mild clinically significant macular edema    Vessels Proliferative diabetic retinopathy, quiet    Periphery  good panretinal photocoagulation, post recent PRP             IMAGING AND PROCEDURES  Imaging and Procedures for 05/15/22  OCT, Retina - OU - Both Eyes       Right Eye Quality was good. Scan locations included subfoveal. Central Foveal Thickness: 254. Progression has improved. Findings include cystoid macular edema.   Notes Much improved CSME currently at 4-week interval examination and injection Avastin..  Thus proving this patient does require ongoing chronic suppressive therapy      Intravitreal Injection, Pharmacologic Agent - OD - Right Eye       Time Out 05/15/2022. 10:55 AM. Confirmed correct patient, procedure, site, and patient consented.   Anesthesia Topical anesthesia was used. Anesthetic medications included Lidocaine 4%.   Procedure Preparation included 5% betadine to ocular surface, 10% betadine to eyelids, Tobramycin 0.3%. A 30 gauge needle was used.    Injection: 2.5 mg Bevacizumab 1.17m/0.05ml   Route: Intravitreal, Site: Right Eye   NDC: 50242-060-01   Post-op Post injection exam found visual acuity of at least counting fingers. The patient tolerated the procedure well. There were no complications. The patient received written and verbal post procedure care education. Post injection medications included ocuflox.              ASSESSMENT/PLAN:  Proliferative diabetic retinopathy of right eye with macular edema associated with type 2 diabetes mellitus (HCC) Much improved macular condition less macular edema and no sign of neovascularization progression.  Improved acuity as well postinjection some 4 weeks previous.  Repeat injection today reevaluate next in 6 weeks  Neovascular glaucoma, right eye, severe stage Also controlled on antivegF     ICD-10-CM   1. Proliferative diabetic retinopathy of right eye with macular edema associated with type 2 diabetes mellitus (HCC)  E11.3511 OCT, Retina - OU - Both Eyes    Intravitreal Injection, Pharmacologic Agent - OD - Right Eye    Bevacizumab (AVASTIN) SOLN 2.5 mg    2. Neovascular glaucoma, right eye, severe stage  H40.51X3       1.  2.  3.  Ophthalmic Meds Ordered this visit:  Meds ordered this encounter  Medications   Bevacizumab (AVASTIN) SOLN 2.5 mg       Return in about 6 weeks (around 06/26/2022) for dilate, OD, AVASTIN OCT.  Patient Instructions  Patient request to return again in 6 weeks.  Patient informed that with the change in [facility] to Dr. RZadie Rhineonly in the practice there is also a change in credentialing.  This  Specifically for his  matters, it requires him to call 2 weeks prior to next visit to confirm participation with [place] healthcare Medicare at that time.  If no  participation has arrived that time, Dr. [name] will assist the patient to have arrangements for an evaluation and injection in another location with  similar capabilities.  Patient nodded his affirmation that he will call 2 weeks prior to his next visit    Explained the diagnoses, plan, and follow up with the patient and they expressed understanding.  Patient expressed understanding of the importance of proper follow up care.   Clent Demark Junice Fei M.D. Diseases & Surgery of the Retina and Vitreous Retina & Diabetic [place] 05/15/22     Abbreviations: M myopia (nearsighted); A astigmatism; H hyperopia (farsighted); P presbyopia; Mrx spectacle prescription;  CTL contact lenses; OD right eye; OS left eye; OU both eyes  XT exotropia; ET esotropia; PEK punctate epithelial keratitis; PEE punctate epithelial erosions; DES dry eye syndrome; MGD meibomian gland dysfunction; ATs artificial tears; PFAT's preservative free artificial tears; [place] nuclear sclerotic cataract; PSC posterior subcapsular cataract; ERM epi-retinal membrane; PVD posterior vitreous detachment; RD retinal detachment; DM diabetes mellitus; DR diabetic retinopathy; NPDR non-proliferative diabetic retinopathy; PDR proliferative diabetic retinopathy; CSME clinically significant macular edema; DME diabetic macular edema; dbh dot blot hemorrhages; CWS cotton wool spot; POAG primary open angle glaucoma; C/D cup-to-disc ratio; HVF humphrey visual field; GVF goldmann visual field; OCT optical coherence tomography; IOP intraocular pressure; BRVO Branch retinal vein occlusion; CRVO central retinal vein occlusion; CRAO central retinal artery occlusion; BRAO branch retinal artery occlusion; RT retinal tear; SB scleral buckle; PPV pars plana vitrectomy; VH Vitreous hemorrhage; PRP panretinal laser photocoagulation; IVK intravitreal kenalog; VMT vitreomacular traction; MH Macular hole;  NVD neovascularization of the disc; NVE neovascularization elsewhere; AREDS age related eye disease study; ARMD age related macular degeneration; POAG primary open angle glaucoma; EBMD epithelial/anterior basement  membrane dystrophy; ACIOL anterior chamber intraocular lens; IOL intraocular lens; PCIOL posterior chamber intraocular lens; Phaco/IOL phacoemulsification with intraocular lens placement; Hodge photorefractive keratectomy; LASIK laser assisted in situ keratomileusis; HTN hypertension; DM diabetes mellitus; COPD chronic obstructive pulmonary disease

## 2022-05-15 NOTE — Assessment & Plan Note (Signed)
Also controlled on antivegF

## 2022-05-15 NOTE — Assessment & Plan Note (Signed)
Much improved macular condition less macular edema and no sign of neovascularization progression.  Improved acuity as well postinjection some 4 weeks previous.  Repeat injection today reevaluate next in 6 weeks

## 2022-05-19 ENCOUNTER — Other Ambulatory Visit: Payer: Self-pay | Admitting: Internal Medicine

## 2022-05-19 DIAGNOSIS — F5101 Primary insomnia: Secondary | ICD-10-CM

## 2022-05-19 NOTE — Telephone Encounter (Signed)
   Notes to clinic:  Pharm requests as follows: Pharmacy comment: REQUEST FOR 90 DAYS PRESCRIPTION. DX Code Needed.      Requested Prescriptions  Pending Prescriptions Disp Refills   traZODone (DESYREL) 50 MG tablet [Pharmacy Med Name: TRAZODONE 50 MG TABLET] 45 tablet 3    Sig: TAKE 0.5 TABLETS BY MOUTH AT BEDTIME.     Psychiatry: Antidepressants - Serotonin Modulator Passed - 05/19/2022  8:32 AM      Passed - Valid encounter within last 6 months    Recent Outpatient Visits           1 month ago Type 2 diabetes mellitus with diabetic polyneuropathy, with long-term current use of insulin ([place])   [place] Ladell Pier, MD   7 months ago Hospital discharge follow-up   [place], MD   11 months ago Type 2 diabetes mellitus with diabetic polyneuropathy, with long-term current use of insulin [facility])   [place], Deborah B, MD   1 year ago Type 2 diabetes mellitus with diabetic polyneuropathy, with long-term current use of insulin [facility])   [place], Deborah B, MD   1 year ago Hospital discharge follow-up   [place], MD       Future Appointments             In 1 month Minus Breeding, MD [place]. [place]   In 2 months Wynetta Emery, Dalbert Batman, MD [place]

## 2022-06-04 ENCOUNTER — Other Ambulatory Visit: Payer: Self-pay | Admitting: Internal Medicine

## 2022-06-04 DIAGNOSIS — E118 Type 2 diabetes mellitus with unspecified complications: Secondary | ICD-10-CM

## 2022-06-13 ENCOUNTER — Ambulatory Visit: Payer: Self-pay

## 2022-06-13 NOTE — Telephone Encounter (Signed)
Chief Complaint: High BP (174/81 right before calling office) Symptoms: slight lightheaded (not bad per patient) Frequency: About 15 min before calling felt lightheaded. BP up to 200/90 last evening Pertinent Negatives: Patient denies other symptoms Disposition: [] ED /[] Urgent Care (no appt availability in office) / [x] Appointment(In office/virtual)/ []  [place] [facility]/ [] Home Care/ [] Refused Recommended Disposition /[] [place] [facility]/ []  Follow-up with PCP Additional Notes: Asked if he could take extra dose of hydralazine today, advised to take it as instructions on bottle TID, advised to take the pill in 1 hour around 1430 and to check BP around 2 hours after and if still high above 180/110 per protocol, to go to the ED. Patient verbalized understanding. MyChart video visit scheduled for tomorrow morning.    Reason for Disposition  Systolic BP  >= 628 OR Diastolic >= 638  Answer Assessment - Initial Assessment Questions 1. BLOOD PRESSURE: "What is the blood pressure?" "Did you take at least two measurements 5 minutes apart?"     174/81 2. ONSET: "When did you take your blood pressure?"     Few minutes ago 3. HOW: "How did you take your blood pressure?" (e.g., automatic home BP monitor, visiting nurse)     Automatic cuff at home 4. HISTORY: "Do you have a history of high blood pressure?"     Yes 5. MEDICINES: "Are you taking any medicines for blood pressure?" "Have you missed any doses recently?"     Hydralazine, lisinopril, carvedilol; no missed doses 6. OTHER SYMPTOMS: "Do you have any symptoms?" (e.g., blurred vision, chest pain, difficulty breathing, headache, weakness)     Lightheaded (a little, not too bad) x 20 minutes  Protocols used: Blood Pressure - High-A-AH

## 2022-06-14 ENCOUNTER — Ambulatory Visit: Payer: 59 | Attending: Family | Admitting: Family

## 2022-06-16 NOTE — Telephone Encounter (Signed)
Call pt and he states that his BP is doing better, he was instructed by his cardiologist to increase his Lisinopril to 26m.

## 2022-06-26 ENCOUNTER — Encounter (INDEPENDENT_AMBULATORY_CARE_PROVIDER_SITE_OTHER): Payer: 59 | Admitting: Ophthalmology

## 2022-07-13 NOTE — Progress Notes (Signed)
Cardiology Office Note   Date:  07/14/2022   ID:  Derek Lowery, DOB November 21, 1955, MRN [id number]  PCP:  Derek Pier, MD  Cardiologist:   Skeet Latch, MD   Chief Complaint  Patient presents with   Cardiomyopathy     History of Present Illness: Derek Lowery is a 66 y.o. male with a history of coronary artery disease.  I saw him because of an abnormal EKG. He has previously been seen by Dr. Lia Foyer in 2013.   He has a history of CAD.   EF was 60 - 65% on echo in February.    In 2012 he had cardiac catheterization with 60% diagonal stenosis, distal LAD 60% stenosis, OM 2 with an occluded superior branch and inferior 90% branch stenosis.  He had DES stenting to the OM 2 inferior branch.  His EF was well-preserved and he was managed medically otherwise.  He was not seen again until 2020 when he was seen prior to getting gallbladder surgery.  Was seen in the hospital.  He had right bundle branch block.  He had a normal echocardiogram.  There were no other significant abnormalities at that time. He was in with respiratory failure in May 2022 and again in January 2023 he had acute respiratory failure and was ventilatory dependent with pneumonia and pulmonary edema.  His EF was 30 to 35% with global hypokinesis.  Since he was last seen he has had no new cardiovascular complaints.  He gets around with a cane or a walker.  He denies any shortness of breath, PND or orthopnea.  He has had no palpitations, presyncope or syncope.  He gets around with a walker in the yard or with a cane elsewhere.  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  He has a little bit of balance issue.  The patient denies any new symptoms such as chest discomfort, neck or arm discomfort. There has been no new shortness of breath, PND or orthopnea. There have been no reported palpitations, presyncope or syncope.   Past Medical History:  Diagnosis Date   Arthritis    CAD (coronary  artery disease)    NSTEMI 06/2011:  LHC 07/18/11: Proximal diagonal 60%, distal LAD with a diabetic appearance and 60% stenosis, OM2 with an occluded superior branch and an inferior branch with 90%, EF 55% with inferior hypokinesis.  PCI: Promus DES to the OM2 inferior branch.  This vessel provides collaterals to the superior branch which remained occluded.  Echocardiogram 07/18/11: EF 60%, normal wall motion.   Cataract    Essential hypertension, benign    Glaucoma    Hypercholesteremia    Internal hemorrhoids    Noncompliance    Type 2 diabetes mellitus ([place]) 1990    Past Surgical History:  Procedure Laterality Date   CATARACT EXTRACTION Right 2019   Dr. Katy Fitch   CHOLECYSTECTOMY N/A 09/21/2017   Procedure: LAPAROSCOPIC CHOLECYSTECTOMY WITH INTRAOPERATIVE CHOLANGIOGRAM;  Surgeon: Michael Boston, MD;  Location: [facility];  Service: General;  Laterality: N/A;   COLONOSCOPY  2000   Dr. Collene Mares   EYE SURGERY     IRRIGATION AND DEBRIDEMENT ABSCESS Left 05/05/2014   Procedure: IRRIGATION AND DEBRIDEMENT ABSCESS left buttock;  Surgeon: Michael Boston, MD;  Location: [facility];  Service: General;  Laterality: Left;   LEFT HEART CATHETERIZATION WITH CORONARY ANGIOGRAM N/A 07/18/2011   Procedure: LEFT HEART CATHETERIZATION WITH CORONARY ANGIOGRAM;  Surgeon: Hillary Bow, MD;  Location: [place] CATH LAB;  Service: Cardiovascular;  Laterality: N/A;   PERCUTANEOUS CORONARY STENT INTERVENTION (PCI-S)  07/18/2011   Procedure: PERCUTANEOUS CORONARY STENT INTERVENTION (PCI-S);  Surgeon: Hillary Bow, MD;  Location: [facility] CATH LAB;  Service: Cardiovascular;;     Current Outpatient Medications  Medication Sig Dispense Refill   acetaminophen (TYLENOL) 325 MG tablet Take 2 tablets (650 mg total) by mouth every 6 (six) hours as needed for mild pain (or Fever >/= 101).     ammonium lactate (AMLACTIN) 12 % lotion Apply to both feet twice daily for dry skin. (Patient taking differently: 1 application  as needed for dry  skin. Apply to both feet twice daily for dry skin.) 400 g 6   aspirin EC 81 MG tablet Take 1 tablet (81 mg total) by mouth daily. 30 tablet 4   atorvastatin (LIPITOR) 40 MG tablet TAKE 1 TABLET EVERY DAY 90 tablet 2   BD PEN NEEDLE NANO 2ND GEN 32G X 4 MM MISC USE AS DIRECTED 3 TIMES A DAY. PLEASE SCHEDULE AN APPOINTMENT FOR ADDITIONAL REFILLS. 100 each 3   brimonidine (ALPHAGAN) 0.2 % ophthalmic solution Place 1 drop into the right eye 2 (two) times daily.      carvedilol (COREG) 12.5 MG tablet TAKE 1 TABLET (12.5MG TOTAL) BY MOUTH TWICE A DAY WITH MEALS 180 tablet 1   Continuous Blood Gluc Receiver (DEXCOM G6 RECEIVER) DEVI Use to check blood sugar four times daily. E11.42 1 each 0   Continuous Blood Gluc Sensor (DEXCOM G6 SENSOR) MISC Use to check blood sugar four times daily. E11.42 3 each 3   Continuous Blood Gluc Transmit (DEXCOM G6 TRANSMITTER) MISC Use to check blood sugar four times daily. E11.42 1 each 3   dorzolamide (TRUSOPT) 2 % ophthalmic solution Place 1 drop into the right eye 2 (two) times daily.     hydrALAZINE (APRESOLINE) 100 MG tablet TAKE 1 TABLET BY MOUTH THREE TIMES A DAY 270 tablet 1   insulin glargine (LANTUS) 100 UNIT/ML injection Inject 10 Units into the skin at bedtime.     insulin lispro (HUMALOG KWIKPEN) 100 UNIT/ML KwikPen INJECT 10 UNITS INTO THE SKIN 3 (THREE) TIMES DAILY WITH MEALS. 15 mL 3   isosorbide mononitrate (IMDUR) 120 MG 24 hr tablet Take 1 tablet (120 mg total) by mouth daily. 30 tablet 6   Lancets MISC Use as directed.  Accu chek 2 100 each 11   latanoprost (XALATAN) 0.005 % ophthalmic solution Place 1 drop into the right eye at bedtime.     lisinopril (ZESTRIL) 20 MG tablet Take 20 mg by mouth daily.     Multiple Vitamins-Minerals (MULTIVITAMIN WITH MINERALS) tablet Take 1 tablet by mouth daily.     RHOPRESSA 0.02 % SOLN Place 1 drop into the right eye at bedtime.     timolol (TIMOPTIC) 0.5 % ophthalmic solution Place 1 drop into the right eye 2 (two)  times daily.     torsemide (DEMADEX) 20 MG tablet TAKE 2 TABLETS (40MG) BY MOUTH IN THE MORNING AND 1 TABLET (20MG) BY MOUTH IN THE EVENING. 270 tablet 1   traZODone (DESYREL) 50 MG tablet TAKE 0.5 TABLETS BY MOUTH AT BEDTIME. 45 tablet 0   No current facility-administered medications for this visit.    Allergies:   Empagliflozin    ROS:  Please see the history of present illness.   Otherwise, review of systems are positive for none.   All other systems are reviewed and negative.  PHYSICAL EXAM: VS:  BP 130/70   Pulse (!) 55   Ht 5' 10"  (1.778 m)   Wt 221 lb 9.6 oz (100.5 kg)   SpO2 94%   BMI 31.80 kg/m  , BMI Body mass index is 31.8 kg/m. GENERAL:  Well appearing NECK:  No jugular venous distention, waveform within normal limits, carotid upstroke brisk and symmetric, no bruits, no thyromegaly LUNGS:  Clear to auscultation bilaterally CHEST:  Unremarkable HEART:  PMI not displaced or sustained,S1 and S2 within normal limits, no S3, no S4, no clicks, no rubs, no murmurs ABD:  Flat, positive bowel sounds normal in frequency in pitch, no bruits, no rebound, no guarding, no midline pulsatile mass, no hepatomegaly, no splenomegaly EXT:  2 plus pulses throughout, no edema, no cyanosis no clubbing  Echo 08/2021  1. Mild global hypokinesis with more severe hypokinesis in the  inferolateral, anterolateral, and apical myocardium. Left ventricular  ejection fraction, by estimation, is 30 to 35%. The left ventricle has  moderately decreased function. The left ventricle   demonstrates regional wall motion abnormalities (see scoring  diagram/findings for description). Left ventricular diastolic parameters  are consistent with Grade II diastolic dysfunction (pseudonormalization).   2. Right ventricular systolic function is mildly reduced. The right  ventricular size is normal. There is normal pulmonary artery systolic  pressure.   3. The mitral valve is normal in structure. Trivial mitral  valve  regurgitation. No evidence of mitral stenosis.   4. The aortic valve is tricuspid. Aortic valve regurgitation is not  visualized. No aortic stenosis is present.   5. The inferior vena cava is dilated in size with >50% respiratory  variability, suggesting right atrial pressure of 8 mmHg.   EKG:  EKG is  ordered today. The ekg ordered today demonstrates sinus rhythm, rate 55, right bundle branch block, leftward axis, no acute ST-T wave changes.   Recent Labs: 09/11/2021: B Natriuretic Peptide 964.9 09/15/2021: Magnesium 2.2 10/18/2021: ALT 13; Hemoglobin 10.3; Platelets 150 01/10/2022: BUN 49; Creatinine, Ser 1.86; Potassium 3.9; Sodium 145    Lipid Panel    Component Value Date/Time   CHOL 95 09/08/2021 0500   CHOL 150 06/17/2021 1145   TRIG 38 09/08/2021 0500   HDL 33 (L) 09/08/2021 0500   HDL 35 (L) 06/17/2021 1145   CHOLHDL 2.9 09/08/2021 0500   VLDL 8 09/08/2021 0500   LDLCALC 54 09/08/2021 0500   LDLCALC 87 06/17/2021 1145      Wt Readings from Last 3 Encounters:  07/14/22 221 lb 9.6 oz (100.5 kg)  03/27/22 222 lb 12.8 oz (101.1 kg)  01/10/22 223 lb 9.6 oz (101.4 kg)      Other studies Reviewed: Additional studies/ records that were reviewed today include: Labs Review of the above records demonstrates:  Please see elsewhere in the note.     ASSESSMENT AND PLAN:    Bilateral lower extremity edema:  This is mild.  No change in therapy.    Coronary artery disease:   The patient has no new sypmtoms.  No further cardiovascular testing is indicated.  We will continue with aggressive risk reduction and meds as listed.  Essential hypertension: The blood pressure is being managed in the context of treating his cardiomyopathy.   Hyperlipidemia: LDL was 54 with an HDL of 33.  No change in therapy.    Diabetes mellitus: A1c was 6.2 which is down from 7.1.  No change in therapy.   Cardiomyopathy his ejection fraction is reduced.   I  am going to repeat an  echocardiogram.  He is on a very low-dose of ACE inhibitor which is being managed by nephrology.  He has significant renal insufficiency with his creatinine up to 2.43.  I will not titrate ACE ARB or spironolactone at this point because of that.  He is on a good dose of hydralazine nitrates.  His creatinine is too low for [place].  He is tolerating the current dose of beta-blocker as he otherwise has bradycardia and conduction disturbances as described.  I think this is optimal medical therapy for his situation.  He seems to be euvolemic.   Current medicines are reviewed at length with the patient today.  The patient does not have concerns regarding medicines.  The following changes have been made:  No change.   Labs/ tests ordered today include:      Orders Placed This Encounter  Procedures   EKG 12-Lead   ECHOCARDIOGRAM COMPLETE     Disposition:   FU with me in 6 months.     Signed, Minus Breeding, MD  07/14/2022 10:50 AM    [place]

## 2022-07-14 ENCOUNTER — Ambulatory Visit: Payer: 59 | Attending: Cardiology | Admitting: Cardiology

## 2022-07-14 ENCOUNTER — Encounter: Payer: Self-pay | Admitting: Cardiology

## 2022-07-14 VITALS — BP 130/70 | HR 55 | Ht 70.0 in | Wt 221.6 lb

## 2022-07-14 DIAGNOSIS — I2510 Atherosclerotic heart disease of native coronary artery without angina pectoris: Secondary | ICD-10-CM

## 2022-07-14 DIAGNOSIS — M7989 Other specified soft tissue disorders: Secondary | ICD-10-CM | POA: Diagnosis not present

## 2022-07-14 DIAGNOSIS — I10 Essential (primary) hypertension: Secondary | ICD-10-CM

## 2022-07-14 DIAGNOSIS — I5021 Acute systolic (congestive) heart failure: Secondary | ICD-10-CM | POA: Diagnosis not present

## 2022-07-14 NOTE — Patient Instructions (Signed)
Medication Instructions:  Your physician recommends that you continue on your current medications as directed. Please refer to the Current Medication list given to you today.  *If you need a refill on your cardiac medications before your next appointment, please call your pharmacy*   Testing/Procedures: Your physician has requested that you have an echocardiogram. Echocardiography is a painless test that uses sound waves to create images of your heart. It provides your doctor with information about the size and shape of your heart and how well your heart's chambers and valves are working. This procedure takes approximately one hour. There are no restrictions for this procedure. Please do NOT wear cologne, perfume, aftershave, or lotions (deodorant is allowed). Please arrive 15 minutes prior to your appointment time. This procedure will be done at [address]   Follow-Up: At [facility], you and your health needs are our priority.  As part of our continuing mission to provide you with exceptional heart care, we have created designated Provider Care Teams.  These Care Teams include your primary Cardiologist (physician) and Advanced Practice Providers (APPs -  Physician Assistants and Nurse Practitioners) who all work together to provide you with the care you need, when you need it.  We recommend signing up for the patient portal called "MyChart".  Sign up information is provided on this After Visit Summary.  MyChart is used to connect with patients for Virtual Visits (Telemedicine).  Patients are able to view lab/test results, encounter notes, upcoming appointments, etc.  Non-urgent messages can be sent to your provider as well.   To learn more about what you can do with MyChart, go to [email or web address].    Your next appointment:   6 month(s)  The format for your next appointment:   In Person  Provider:   Minus Breeding, MD

## 2022-07-15 ENCOUNTER — Telehealth: Payer: Self-pay | Admitting: Internal Medicine

## 2022-07-15 ENCOUNTER — Other Ambulatory Visit: Payer: Self-pay | Admitting: Internal Medicine

## 2022-07-15 MED ORDER — CARVEDILOL 12.5 MG PO TABS: ORAL_TABLET | ORAL | 0 refills | Status: DC

## 2022-07-15 NOTE — Telephone Encounter (Signed)
Rx sent 

## 2022-07-15 NOTE — Telephone Encounter (Unsigned)
Copied from [place] #[id number]. Topic: General - Other >> Jul 15, 2022  9:19 AM Everette C wrote: Reason for CRM: Medication Refill - Medication: carvedilol (COREG) 12.5 MG tablet [[id number]]  Has the patient contacted their pharmacy? Yes.  The patient has been directed to contact their PCP  (Agent: If no, request that the patient contact the pharmacy for the refill. If patient does not wish to contact the pharmacy document the reason why and proceed with request.) (Agent: If yes, when and what did the pharmacy advise?)  Preferred Pharmacy (with phone number or street name): [id number] [place] [address]Phone: [phone]: [phone]: Not open 24 hours   Has the patient been seen for an appointment in the last year OR does the patient have an upcoming appointment? Yes.    Agent: Please be advised that RX refills may take up to 3 business days. We ask that you follow-up with your pharmacy.

## 2022-07-15 NOTE — Telephone Encounter (Signed)
Pt was last seen 03/27/22 and has appt 12/14 out of med/ pls provide enough to at least reach appt Medication Refill - Medication:   Disp Refills Start End   carvedilol (COREG) 12.5 MG tablet         Has the patient contacted their pharmacy? Yes.   (Agent: If no, request that the patient contact the pharmacy for the refill. If patient does not wish to contact the pharmacy document the reason why and proceed with request.) (Agent: If yes, when and what did the pharmacy advise?) call dr  Preferred Pharmacy (with phone number or street name):   Disp Refills Start End   carvedilol (COREG) 12.5 MG tablet        Has the patient been seen for an appointment in the last year OR does the patient have an upcoming appointment? Yes.    Agent: Please be advised that RX refills may take up to 3 business days. We ask that you follow-up with your pharmacy.

## 2022-07-15 NOTE — Telephone Encounter (Signed)
Reordered today 07/15/22 #180  Requested Prescriptions  Refused Prescriptions Disp Refills   carvedilol (COREG) 12.5 MG tablet 180 tablet 0    Sig: TAKE 1 TABLET (12.5MG TOTAL) BY MOUTH TWICE A DAY WITH MEALS     Cardiovascular: Beta Blockers 3 Failed - 07/15/2022 10:08 AM      Failed - Cr in normal range and within 360 days    Creat  Date Value Ref Range Status  09/02/2016 0.80 0.70 - 1.33 mg/dL Final    Comment:      For patients > or = 64 years of age: The upper reference limit for Creatinine is approximately 13% higher for people identified as African-American.      Creatinine, Ser  Date Value Ref Range Status  01/10/2022 1.86 (H) 0.76 - 1.27 mg/dL Final   Creatinine, Urine  Date Value Ref Range Status  06/30/2020 97.16 mg/dL Final    Comment:    Performed at [facility] [address] [place], [place] [address]         Passed - AST in normal range and within 360 days    AST  Date Value Ref Range Status  10/18/2021 23 0 - 40 IU/L Final         Passed - ALT in normal range and within 360 days    ALT  Date Value Ref Range Status  10/18/2021 13 0 - 44 IU/L Final         Passed - Last BP in normal range    BP Readings from Last 1 Encounters:  07/14/22 130/70         Passed - Last Heart Rate in normal range    Pulse Readings from Last 1 Encounters:  07/14/22 (!) 5         Passed - Valid encounter within last 6 months    Recent Outpatient Visits           3 months ago Type 2 diabetes mellitus with diabetic polyneuropathy, with long-term current use of insulin ([place])   [place] Ladell Pier, MD   9 months ago Hospital discharge follow-up   [place], Deborah B, MD   1 year ago Type 2 diabetes mellitus with diabetic polyneuropathy, with long-term current use of insulin ([place])   [place], Deborah B, MD   1 year ago Type 2 diabetes  mellitus with diabetic polyneuropathy, with long-term current use of insulin [facility])   [place] Ladell Pier, MD   2 years ago Hospital discharge follow-up   [place], MD       Future Appointments             In 2 weeks Ladell Pier, MD [place]   In 5 months Minus Breeding, MD [place] [facility] [place] [place]

## 2022-07-23 ENCOUNTER — Telehealth: Payer: Self-pay | Admitting: Internal Medicine

## 2022-07-23 ENCOUNTER — Other Ambulatory Visit (HOSPITAL_COMMUNITY): Payer: 59

## 2022-07-24 ENCOUNTER — Encounter (HOSPITAL_COMMUNITY): Payer: Self-pay | Admitting: Cardiology

## 2022-07-29 NOTE — Telephone Encounter (Signed)
Called and left vm about getting appointment

## 2022-07-31 ENCOUNTER — Ambulatory Visit: Payer: 59 | Admitting: Internal Medicine

## 2022-08-03 ENCOUNTER — Other Ambulatory Visit: Payer: Self-pay | Admitting: Internal Medicine

## 2022-08-04 ENCOUNTER — Other Ambulatory Visit: Payer: Self-pay | Admitting: Internal Medicine

## 2022-08-04 DIAGNOSIS — E785 Hyperlipidemia, unspecified: Secondary | ICD-10-CM

## 2022-08-04 MED ORDER — HYDRALAZINE HCL 100 MG PO TABS: 100.0000 mg | ORAL_TABLET | Freq: Three times a day (TID) | ORAL | 0 refills | Status: DC

## 2022-08-15 ENCOUNTER — Other Ambulatory Visit: Payer: Self-pay | Admitting: Internal Medicine

## 2022-08-15 DIAGNOSIS — Z794 Long term (current) use of insulin: Secondary | ICD-10-CM

## 2022-08-15 NOTE — Telephone Encounter (Signed)
Medication Refill - Medication:  Continuous Blood Gluc Transmit (DEXCOM G6 TRANSMITTER) MISC  Continuous Blood Gluc Sensor (DEXCOM G6 SENSOR) MISC   Has the patient contacted their pharmacy? No. (Agent: If no, request that the patient contact the pharmacy for the refill. If patient does not wish to contact the pharmacy document the reason why and proceed with request.) (Agent: If yes, when and what did the pharmacy advise?)  Preferred Pharmacy (with phone number or street name):  [id number] [place] [place] [place]Phone: [phone] Fax: [phone] Has the patient been seen for an appointment in the last year OR does the patient have an upcoming appointment? Yes.    Agent: Please be advised that RX refills may take up to 3 business days. We ask that you follow-up with your pharmacy.

## 2022-08-16 ENCOUNTER — Other Ambulatory Visit: Payer: Self-pay | Admitting: Internal Medicine

## 2022-08-16 DIAGNOSIS — F5101 Primary insomnia: Secondary | ICD-10-CM

## 2022-08-16 MED ORDER — DEXCOM G6 TRANSMITTER MISC: 3 refills | Status: DC

## 2022-08-16 MED ORDER — DEXCOM G6 SENSOR MISC: 3 refills | Status: DC

## 2022-08-16 NOTE — Telephone Encounter (Signed)
Requested Prescriptions  Pending Prescriptions Disp Refills   Continuous Blood Gluc Sensor (DEXCOM G6 SENSOR) MISC 3 each 3    Sig: Use to check blood sugar four times daily. E11.42     Endocrinology: Diabetes - Testing Supplies Passed - 08/15/2022  4:27 PM      Passed - Valid encounter within last 12 months    Recent Outpatient Visits           4 months ago Type 2 diabetes mellitus with diabetic polyneuropathy, with long-term current use of insulin ([place])   [place] Ladell Pier, MD   10 months ago Hospital discharge follow-up   [place], Deborah B, MD   1 year ago Type 2 diabetes mellitus with diabetic polyneuropathy, with long-term current use of insulin [facility])   [place], Deborah B, MD   1 year ago Type 2 diabetes mellitus with diabetic polyneuropathy, with long-term current use of insulin [facility])   [place] Ladell Pier, MD   2 years ago Hospital discharge follow-up   [place], Deborah B, MD       Future Appointments             In 3 weeks Thereasa Solo, Casimer Bilis [place]   In 4 months Minus Breeding, MD [place] [facility] [place]             Continuous Blood Gluc Transmit (DEXCOM G6 TRANSMITTER) MISC 1 each 3    Sig: Use to check blood sugar four times daily. E11.42     Endocrinology: Diabetes - Testing Supplies Passed - 08/15/2022  4:27 PM      Passed - Valid encounter within last 12 months    Recent Outpatient Visits           4 months ago Type 2 diabetes mellitus with diabetic polyneuropathy, with long-term current use of insulin ([place])   [place] Ladell Pier, MD   10 months ago Hospital discharge follow-up   [place], Deborah B, MD   1 year ago Type 2 diabetes mellitus with diabetic polyneuropathy, with long-term current use of insulin [facility])   [place], Deborah B, MD   1 year ago Type 2 diabetes mellitus with diabetic polyneuropathy, with long-term current use of insulin [facility])   [place] Ladell Pier, MD   2 years ago Hospital discharge follow-up   [place], Deborah B, MD       Future Appointments             In 3 weeks Thereasa Solo, Casimer Bilis [place]   In 4 months Minus Breeding, MD [place] [facility] [place]. [place]

## 2022-08-16 NOTE — Telephone Encounter (Signed)
Requested Prescriptions  Pending Prescriptions Disp Refills   traZODone (DESYREL) 50 MG tablet [Pharmacy Med Name: TRAZODONE 50 MG TABLET] 45 tablet 0    Sig: TAKE 1/2 TABLET BY MOUTH AT BEDTIME     Psychiatry: Antidepressants - Serotonin Modulator Passed - 08/16/2022  1:23 AM      Passed - Valid encounter within last 6 months    Recent Outpatient Visits           4 months ago Type 2 diabetes mellitus with diabetic polyneuropathy, with long-term current use of insulin ([place])   [place] Ladell Pier, MD   10 months ago Hospital discharge follow-up   [place], Deborah B, MD   1 year ago Type 2 diabetes mellitus with diabetic polyneuropathy, with long-term current use of insulin ([place])   [place], Deborah B, MD   1 year ago Type 2 diabetes mellitus with diabetic polyneuropathy, with long-term current use of insulin [facility])   [place] Ladell Pier, MD   2 years ago Hospital discharge follow-up   [place], Deborah B, MD       Future Appointments             In 3 weeks Thereasa Solo, Dionne Bucy, PA-C [place]   In 4 months Minus Breeding, MD [place] [facility] [place]. [place]

## 2022-08-19 ENCOUNTER — Other Ambulatory Visit: Payer: Self-pay | Admitting: Internal Medicine

## 2022-08-19 DIAGNOSIS — I5032 Chronic diastolic (congestive) heart failure: Secondary | ICD-10-CM

## 2022-08-19 DIAGNOSIS — E1159 Type 2 diabetes mellitus with other circulatory complications: Secondary | ICD-10-CM

## 2022-08-19 DIAGNOSIS — I25118 Atherosclerotic heart disease of native coronary artery with other forms of angina pectoris: Secondary | ICD-10-CM

## 2022-08-19 DIAGNOSIS — E785 Hyperlipidemia, unspecified: Secondary | ICD-10-CM

## 2022-08-22 DIAGNOSIS — M2041 Other hammer toe(s) (acquired), right foot: Secondary | ICD-10-CM | POA: Diagnosis not present

## 2022-08-22 DIAGNOSIS — E1142 Type 2 diabetes mellitus with diabetic polyneuropathy: Secondary | ICD-10-CM | POA: Diagnosis not present

## 2022-08-22 DIAGNOSIS — M79675 Pain in left toe(s): Secondary | ICD-10-CM | POA: Diagnosis not present

## 2022-08-22 DIAGNOSIS — M216X1 Other acquired deformities of right foot: Secondary | ICD-10-CM | POA: Diagnosis not present

## 2022-08-22 DIAGNOSIS — L851 Acquired keratosis [keratoderma] palmaris et plantaris: Secondary | ICD-10-CM | POA: Diagnosis not present

## 2022-08-22 DIAGNOSIS — B351 Tinea unguium: Secondary | ICD-10-CM | POA: Diagnosis not present

## 2022-08-22 DIAGNOSIS — L97512 Non-pressure chronic ulcer of other part of right foot with fat layer exposed: Secondary | ICD-10-CM | POA: Diagnosis not present

## 2022-08-22 DIAGNOSIS — M216X2 Other acquired deformities of left foot: Secondary | ICD-10-CM | POA: Diagnosis not present

## 2022-08-22 DIAGNOSIS — M2042 Other hammer toe(s) (acquired), left foot: Secondary | ICD-10-CM | POA: Diagnosis not present

## 2022-08-22 DIAGNOSIS — M79674 Pain in right toe(s): Secondary | ICD-10-CM | POA: Diagnosis not present

## 2022-08-22 DIAGNOSIS — L84 Corns and callosities: Secondary | ICD-10-CM | POA: Diagnosis not present

## 2022-09-10 ENCOUNTER — Ambulatory Visit: Payer: 59 | Admitting: Physician Assistant

## 2022-09-18 DIAGNOSIS — I5020 Unspecified systolic (congestive) heart failure: Secondary | ICD-10-CM | POA: Diagnosis not present

## 2022-09-18 DIAGNOSIS — E785 Hyperlipidemia, unspecified: Secondary | ICD-10-CM | POA: Diagnosis not present

## 2022-09-18 DIAGNOSIS — E1122 Type 2 diabetes mellitus with diabetic chronic kidney disease: Secondary | ICD-10-CM | POA: Diagnosis not present

## 2022-09-18 DIAGNOSIS — I129 Hypertensive chronic kidney disease with stage 1 through stage 4 chronic kidney disease, or unspecified chronic kidney disease: Secondary | ICD-10-CM | POA: Diagnosis not present

## 2022-09-18 DIAGNOSIS — N1832 Chronic kidney disease, stage 3b: Secondary | ICD-10-CM | POA: Diagnosis not present

## 2022-09-18 DIAGNOSIS — D649 Anemia, unspecified: Secondary | ICD-10-CM | POA: Diagnosis not present

## 2022-09-18 DIAGNOSIS — R809 Proteinuria, unspecified: Secondary | ICD-10-CM | POA: Diagnosis not present

## 2022-09-19 DIAGNOSIS — E1142 Type 2 diabetes mellitus with diabetic polyneuropathy: Secondary | ICD-10-CM | POA: Diagnosis not present

## 2022-09-19 DIAGNOSIS — L97512 Non-pressure chronic ulcer of other part of right foot with fat layer exposed: Secondary | ICD-10-CM | POA: Diagnosis not present

## 2022-09-22 DIAGNOSIS — E1142 Type 2 diabetes mellitus with diabetic polyneuropathy: Secondary | ICD-10-CM | POA: Diagnosis not present

## 2022-09-22 DIAGNOSIS — H3582 Retinal ischemia: Secondary | ICD-10-CM | POA: Diagnosis not present

## 2022-09-22 DIAGNOSIS — H4051X3 Glaucoma secondary to other eye disorders, right eye, severe stage: Secondary | ICD-10-CM | POA: Diagnosis not present

## 2022-09-22 DIAGNOSIS — H2522 Age-related cataract, morgagnian type, left eye: Secondary | ICD-10-CM | POA: Diagnosis not present

## 2022-09-22 DIAGNOSIS — H21542 Posterior synechiae (iris), left eye: Secondary | ICD-10-CM | POA: Diagnosis not present

## 2022-09-22 DIAGNOSIS — H4312 Vitreous hemorrhage, left eye: Secondary | ICD-10-CM | POA: Diagnosis not present

## 2022-09-22 DIAGNOSIS — Z961 Presence of intraocular lens: Secondary | ICD-10-CM | POA: Diagnosis not present

## 2022-09-22 DIAGNOSIS — E113511 Type 2 diabetes mellitus with proliferative diabetic retinopathy with macular edema, right eye: Secondary | ICD-10-CM | POA: Diagnosis not present

## 2022-09-25 ENCOUNTER — Other Ambulatory Visit: Payer: Self-pay | Admitting: Internal Medicine

## 2022-09-25 DIAGNOSIS — E785 Hyperlipidemia, unspecified: Secondary | ICD-10-CM

## 2022-09-25 DIAGNOSIS — I25118 Atherosclerotic heart disease of native coronary artery with other forms of angina pectoris: Secondary | ICD-10-CM

## 2022-09-25 DIAGNOSIS — E1159 Type 2 diabetes mellitus with other circulatory complications: Secondary | ICD-10-CM

## 2022-09-29 ENCOUNTER — Other Ambulatory Visit: Payer: Self-pay | Admitting: Internal Medicine

## 2022-09-29 ENCOUNTER — Ambulatory Visit: Payer: Self-pay | Admitting: *Deleted

## 2022-09-29 DIAGNOSIS — E118 Type 2 diabetes mellitus with unspecified complications: Secondary | ICD-10-CM

## 2022-09-29 DIAGNOSIS — E785 Hyperlipidemia, unspecified: Secondary | ICD-10-CM

## 2022-09-29 DIAGNOSIS — I152 Hypertension secondary to endocrine disorders: Secondary | ICD-10-CM

## 2022-09-29 DIAGNOSIS — I25118 Atherosclerotic heart disease of native coronary artery with other forms of angina pectoris: Secondary | ICD-10-CM

## 2022-09-29 DIAGNOSIS — E1159 Type 2 diabetes mellitus with other circulatory complications: Secondary | ICD-10-CM

## 2022-09-29 NOTE — Telephone Encounter (Signed)
Future visit in 3 weeks. Patient must keep OV for further refills. Requested Prescriptions  Pending Prescriptions Disp Refills   atorvastatin (LIPITOR) 40 MG tablet [Pharmacy Med Name: ATORVASTATIN 40 MG TABLET] 30 tablet 0    Sig: TAKE 1 TABLET BY MOUTH EVERY DAY     Cardiovascular:  Antilipid - Statins Failed - 09/29/2022  1:29 AM      Failed - Lipid Panel in normal range within the last 12 months    Cholesterol, Total  Date Value Ref Range Status  06/17/2021 150 100 - 199 mg/dL Final   Cholesterol  Date Value Ref Range Status  09/08/2021 95 0 - 200 mg/dL Final   LDL Chol Calc (NIH)  Date Value Ref Range Status  06/17/2021 87 0 - 99 mg/dL Final   LDL Cholesterol  Date Value Ref Range Status  09/08/2021 54 0 - 99 mg/dL Final    Comment:           Total Cholesterol/HDL:CHD Risk Coronary Heart Disease Risk Table                     Men   Women  1/2 Average Risk   3.4   3.3  Average Risk       5.0   4.4  2 X Average Risk   9.6   7.1  3 X Average Risk  23.4   11.0        Use the calculated Patient Ratio above and the CHD Risk Table to determine the patient's CHD Risk.        ATP III CLASSIFICATION (LDL):  <100     mg/dL   Optimal  100-129  mg/dL   Near or Above                    Optimal  130-159  mg/dL   Borderline  160-189  mg/dL   High  >190     mg/dL   Very High Performed at [facility] [address], [place],  [address]    HDL  Date Value Ref Range Status  09/08/2021 33 (L) >40 mg/dL Final  06/17/2021 35 (L) >39 mg/dL Final   Triglycerides  Date Value Ref Range Status  09/08/2021 38 <150 mg/dL Final         Passed - Patient is not pregnant      Passed - Valid encounter within last 12 months    Recent Outpatient Visits           6 months ago Type 2 diabetes mellitus with diabetic polyneuropathy, with long-term current use of insulin ([place])   [place] Ladell Pier, MD   11 months ago Hospital  discharge follow-up   [place], MD   1 year ago Type 2 diabetes mellitus with diabetic polyneuropathy, with long-term current use of insulin [facility])   [place] Karle Plumber B, MD   1 year ago Type 2 diabetes mellitus with diabetic polyneuropathy, with long-term current use of insulin [facility])   [place] Ladell Pier, MD   2 years ago Hospital discharge follow-up   [place], MD       Future Appointments             In 3 weeks Ladell Pier,  MD [place]   In 3 months Hochrein, Jeneen Rinks, MD [place] at [facility]             isosorbide mononitrate (IMDUR) 120 MG 24 hr tablet [Pharmacy Med Name: Domingo Pulse ER 120 MG] 30 tablet 0    Sig: TAKE 1 TABLET BY MOUTH EVERY DAY     Cardiovascular:  Nitrates Passed - 09/29/2022  1:29 AM      Passed - Last BP in normal range    BP Readings from Last 1 Encounters:  07/14/22 130/70         Passed - Last Heart Rate in normal range    Pulse Readings from Last 1 Encounters:  07/14/22 (!) 59         Passed - Valid encounter within last 12 months    Recent Outpatient Visits           6 months ago Type 2 diabetes mellitus with diabetic polyneuropathy, with long-term current use of insulin ([place])   [place] Ladell Pier, MD   11 months ago Hospital discharge follow-up   [place], MD   1 year ago Type 2 diabetes mellitus with diabetic polyneuropathy, with long-term current use of insulin ([place])   [place] Karle Plumber B, MD   1 year ago Type 2 diabetes mellitus with diabetic polyneuropathy, with long-term current use of insulin [facility])   [place] Ladell Pier, MD   2 years ago Hospital discharge follow-up   [place], MD       Future Appointments             In 3 weeks Ladell Pier, MD [place]   In 3 months Minus Breeding, MD [place] at [facility]            Signed Prescriptions Disp Refills   BD PEN NEEDLE NANO 2ND GEN 32G X 4 MM MISC 100 each 3    Sig: USE AS DIRECTED 3 TIMES A DAY. PLEASE SCHEDULE AN APPOINTMENT FOR ADDITIONAL REFILLS.     Endocrinology: Diabetes - Testing Supplies Passed - 09/29/2022  1:29 AM      Passed - Valid encounter within last 12 months    Recent Outpatient Visits           6 months ago Type 2 diabetes mellitus with diabetic polyneuropathy, with long-term current use of insulin [facility])   [place] Ladell Pier, MD   11 months ago Hospital discharge follow-up   [place], MD   1 year ago Type 2 diabetes mellitus with diabetic polyneuropathy, with long-term current use of insulin [facility])   [place] Karle Plumber B, MD   1 year ago Type 2 diabetes mellitus with diabetic polyneuropathy, with long-term current use of insulin [facility])   [place] Ladell Pier, MD   2 years ago Hospital discharge follow-up   [place], MD       Future Appointments             In 3 weeks Wynetta Emery,  Dalbert Batman, MD [place]   In 3 months Minus Breeding, MD [place] at [facility]

## 2022-09-29 NOTE — Telephone Encounter (Signed)
Patient given earlier appointments .

## 2022-09-29 NOTE — Telephone Encounter (Signed)
Future visit in 3 weeks.  Requested Prescriptions  Pending Prescriptions Disp Refills   atorvastatin (LIPITOR) 40 MG tablet [Pharmacy Med Name: ATORVASTATIN 40 MG TABLET] 30 tablet 0    Sig: TAKE 1 TABLET BY MOUTH EVERY DAY     Cardiovascular:  Antilipid - Statins Failed - 09/29/2022  1:29 AM      Failed - Lipid Panel in normal range within the last 12 months    Cholesterol, Total  Date Value Ref Range Status  06/17/2021 150 100 - 199 mg/dL Final   Cholesterol  Date Value Ref Range Status  09/08/2021 95 0 - 200 mg/dL Final   LDL Chol Calc (NIH)  Date Value Ref Range Status  06/17/2021 87 0 - 99 mg/dL Final   LDL Cholesterol  Date Value Ref Range Status  09/08/2021 54 0 - 99 mg/dL Final    Comment:           Total Cholesterol/HDL:CHD Risk Coronary Heart Disease Risk Table                     Men   Women  1/2 Average Risk   3.4   3.3  Average Risk       5.0   4.4  2 X Average Risk   9.6   7.1  3 X Average Risk  23.4   11.0        Use the calculated Patient Ratio above and the CHD Risk Table to determine the patient's CHD Risk.        ATP III CLASSIFICATION (LDL):  <100     mg/dL   Optimal  100-129  mg/dL   Near or Above                    Optimal  130-159  mg/dL   Borderline  160-189  mg/dL   High  >190     mg/dL   Very High Performed at [facility] [address] [place], [place] [address]    HDL  Date Value Ref Range Status  09/08/2021 33 (L) >40 mg/dL Final  06/17/2021 35 (L) >39 mg/dL Final   Triglycerides  Date Value Ref Range Status  09/08/2021 38 <150 mg/dL Final         Passed - Patient is not pregnant      Passed - Valid encounter within last 12 months    Recent Outpatient Visits           6 months ago Type 2 diabetes mellitus with diabetic polyneuropathy, with long-term current use of insulin ([place])   [place] Ladell Pier, MD   11 months ago Hospital discharge follow-up   [place], MD   1 year ago Type 2 diabetes mellitus with diabetic polyneuropathy, with long-term current use of insulin [facility])   [place] Karle Plumber B, MD   1 year ago Type 2 diabetes mellitus with diabetic polyneuropathy, with long-term current use of insulin [facility])   [place] Ladell Pier, MD   2 years ago Hospital discharge follow-up   [place], MD       Future Appointments             In 3 weeks Ladell Pier, MD [place]  [place]   In 3 months Hochrein, Jeneen Rinks, MD [place] at [facility]             isosorbide mononitrate (IMDUR) 120 MG 24 hr tablet [Pharmacy Med Name: Domingo Pulse ER 120 MG] 30 tablet 0    Sig: TAKE 1 TABLET BY MOUTH EVERY DAY     Cardiovascular:  Nitrates Passed - 09/29/2022  1:29 AM      Passed - Last BP in normal range    BP Readings from Last 1 Encounters:  07/14/22 130/70         Passed - Last Heart Rate in normal range    Pulse Readings from Last 1 Encounters:  07/14/22 (!) 33         Passed - Valid encounter within last 12 months    Recent Outpatient Visits           6 months ago Type 2 diabetes mellitus with diabetic polyneuropathy, with long-term current use of insulin ([place])   [place] Ladell Pier, MD   11 months ago Hospital discharge follow-up   [place], MD   1 year ago Type 2 diabetes mellitus with diabetic polyneuropathy, with long-term current use of insulin ([place])   [place] Karle Plumber B, MD   1 year ago Type 2 diabetes mellitus with diabetic polyneuropathy, with long-term current use of insulin [facility])   [place]  Ladell Pier, MD   2 years ago Hospital discharge follow-up   [place], MD       Future Appointments             In 3 weeks Ladell Pier, MD [place]   In 3 months Minus Breeding, MD [place] at [place] X 4 MM [place] [Pharmacy Med Name: BD NANO 2 GEN PEN NDL 32G 4MM] 100 each 3    Sig: USE AS DIRECTED 3 TIMES A DAY. PLEASE SCHEDULE AN APPOINTMENT FOR ADDITIONAL REFILLS.     Endocrinology: Diabetes - Testing Supplies Passed - 09/29/2022  1:29 AM      Passed - Valid encounter within last 12 months    Recent Outpatient Visits           6 months ago Type 2 diabetes mellitus with diabetic polyneuropathy, with long-term current use of insulin [facility])   [place] Ladell Pier, MD   11 months ago Hospital discharge follow-up   [place], MD   1 year ago Type 2 diabetes mellitus with diabetic polyneuropathy, with long-term current use of insulin [facility])   [place] Karle Plumber B, MD   1 year ago Type 2 diabetes mellitus with diabetic polyneuropathy, with long-term current use of insulin [facility])   [place] Ladell Pier, MD   2 years ago Hospital discharge follow-up   [place], MD       Future Appointments             In 3 weeks Wynetta Emery,  Dalbert Batman, MD [place]   In 3 months Minus Breeding, MD [place] at [facility]

## 2022-09-29 NOTE — Telephone Encounter (Signed)
Diarrhea Advice   Pt is calling to report that he has diarrhea that began yesterday. Please advise [phone]     Chief Complaint: Diarrhea Symptoms: Diarrhea x 7 yesterday, x 3 so far today, "watery." States H/O for months. Reports takes imodium and resolves, but reoccurs within weeks Frequency: "For months" off and on Pertinent Negatives: Patient denies nausea, vomiting, abdominal pain,no S/S dehydration.Has not been on ATBs Disposition: []$ ED /[]$ Urgent Care (no appt availability in office) / [x]$ Appointment(In office/virtual)/ []$  [place] [facility]/ []$ Home Care/ []$ Refused Recommended Disposition /[]$ [place] [facility]/ []$  Follow-up with PCP Additional Notes: First available appt secured for 11/10/22. Placed on wait list. Would like earlier appt if available. Care advise provided. Pt verbalizes understanding.   Reason for Disposition  [1] MODERATE diarrhea (e.g., 4-6 times / day more than normal) AND [2] present > 48 hours (2 days)  Answer Assessment - Initial Assessment Questions 1. DIARRHEA SEVERITY: "How bad is the diarrhea?" "How many more stools have you had in the past 24 hours than normal?"    - NO DIARRHEA (SCALE 0)   - MILD (SCALE 1-3): Few loose or mushy BMs; increase of 1-3 stools over normal daily number of stools; mild increase in ostomy output.   -  MODERATE (SCALE 4-7): Increase of 4-6 stools daily over normal; moderate increase in ostomy output.   -  SEVERE (SCALE 8-10; OR "WORST POSSIBLE"): Increase of 7 or more stools daily over normal; moderate increase in ostomy output; incontinence.     Moderate 2. ONSET: "When did the diarrhea begin?"      Yesterday, went 7 times,  3 times this AM 3. BM CONSISTENCY: "How loose or watery is the diarrhea?"      Watery 4. VOMITING: "Are you also vomiting?" If Yes, ask: "How many times in the past 24 hours?"      No 5. ABDOMEN PAIN: "Are you having any abdomen pain?" If Yes, ask: "What does it feel like?" (e.g., crampy,  dull, intermittent, constant)      No 6. ABDOMEN PAIN SEVERITY: If present, ask: "How bad is the pain?"  (e.g., Scale 1-10; mild, moderate, or severe)   - MILD (1-3): doesn't interfere with normal activities, abdomen soft and not tender to touch    - MODERATE (4-7): interferes with normal activities or awakens from sleep, abdomen tender to touch    - SEVERE (8-10): excruciating pain, doubled over, unable to do any normal activities       no 7. ORAL INTAKE: If vomiting, "Have you been able to drink liquids?" "How much liquids have you had in the past 24 hours?"    NA 8. HYDRATION: "Any signs of dehydration?" (e.g., dry mouth [not just dry lips], too weak to stand, dizziness, new weight loss) "When did you last urinate?"     No 10. ANTIBIOTIC USE: "Are you taking antibiotics now or have you taken antibiotics in the past 2 months?"       No 11. OTHER SYMPTOMS: "Do you have any other symptoms?" (e.g., fever, blood in stool)       No  Protocols used: Diarrhea-A-AH

## 2022-10-01 ENCOUNTER — Encounter: Payer: Self-pay | Admitting: Internal Medicine

## 2022-10-01 DIAGNOSIS — E113511 Type 2 diabetes mellitus with proliferative diabetic retinopathy with macular edema, right eye: Secondary | ICD-10-CM | POA: Diagnosis not present

## 2022-10-01 NOTE — Progress Notes (Signed)
Patient was seen by his nephrologist Dr. Johnney Ou 09/18/2022. Patient's blood pressure was 136/60. Stage IIIb CKD progressive in setting of CHF poorly controlled DM. Creatinine 2.0 weight, GFR 35 Albumin/creatinine ratio 1439 Hemoglobin 10.5, iron 64, iron saturation 28%, ferritin 68.  Chronic anemia.  Has not required ESA today.

## 2022-10-08 ENCOUNTER — Other Ambulatory Visit: Payer: Self-pay | Admitting: Internal Medicine

## 2022-10-16 DIAGNOSIS — E113511 Type 2 diabetes mellitus with proliferative diabetic retinopathy with macular edema, right eye: Secondary | ICD-10-CM | POA: Diagnosis not present

## 2022-10-23 ENCOUNTER — Telehealth: Payer: Self-pay

## 2022-10-23 ENCOUNTER — Ambulatory Visit: Payer: 59 | Attending: Internal Medicine

## 2022-10-23 NOTE — Telephone Encounter (Signed)
This nurse attempted to call patient three times. Message left that we will call again to reschedule.

## 2022-10-24 ENCOUNTER — Ambulatory Visit: Payer: 59 | Attending: Internal Medicine | Admitting: Internal Medicine

## 2022-10-24 ENCOUNTER — Other Ambulatory Visit: Payer: Self-pay | Admitting: Internal Medicine

## 2022-10-24 ENCOUNTER — Encounter: Payer: Self-pay | Admitting: Internal Medicine

## 2022-10-24 VITALS — BP 134/67 | HR 56 | Temp 97.7°F | Ht 70.0 in | Wt 214.0 lb

## 2022-10-24 DIAGNOSIS — I5022 Chronic systolic (congestive) heart failure: Secondary | ICD-10-CM | POA: Diagnosis not present

## 2022-10-24 DIAGNOSIS — E1159 Type 2 diabetes mellitus with other circulatory complications: Secondary | ICD-10-CM | POA: Diagnosis not present

## 2022-10-24 DIAGNOSIS — Z794 Long term (current) use of insulin: Secondary | ICD-10-CM | POA: Diagnosis not present

## 2022-10-24 DIAGNOSIS — K529 Noninfective gastroenteritis and colitis, unspecified: Secondary | ICD-10-CM

## 2022-10-24 DIAGNOSIS — I152 Hypertension secondary to endocrine disorders: Secondary | ICD-10-CM

## 2022-10-24 DIAGNOSIS — E1142 Type 2 diabetes mellitus with diabetic polyneuropathy: Secondary | ICD-10-CM

## 2022-10-24 DIAGNOSIS — E785 Hyperlipidemia, unspecified: Secondary | ICD-10-CM

## 2022-10-24 DIAGNOSIS — Z23 Encounter for immunization: Secondary | ICD-10-CM

## 2022-10-24 DIAGNOSIS — N1832 Chronic kidney disease, stage 3b: Secondary | ICD-10-CM

## 2022-10-24 DIAGNOSIS — E113511 Type 2 diabetes mellitus with proliferative diabetic retinopathy with macular edema, right eye: Secondary | ICD-10-CM

## 2022-10-24 DIAGNOSIS — I25118 Atherosclerotic heart disease of native coronary artery with other forms of angina pectoris: Secondary | ICD-10-CM

## 2022-10-24 LAB — POCT GLYCOSYLATED HEMOGLOBIN (HGB A1C): HbA1c, POC (controlled diabetic range): 5.9 % (ref 0.0–7.0)

## 2022-10-24 LAB — GLUCOSE, POCT (MANUAL RESULT ENTRY): POC Glucose: 119 mg/dl — AB (ref 70–99)

## 2022-10-24 MED ORDER — DEXCOM G6 SENSOR MISC: 6 refills | Status: DC

## 2022-10-24 MED ORDER — DEXCOM G6 TRANSMITTER MISC: 3 refills | Status: DC

## 2022-10-24 NOTE — Telephone Encounter (Signed)
Requested medication (s) are due for refill today: yes  Requested medication (s) are on the active medication list: yes  Last refill:  Atorvastatin: 09/29/22 #30      isosorbide: 10/11/22 #30  Future visit scheduled: yes (today)  Notes to clinic:  overdue lab work   Requested Prescriptions  Pending Prescriptions Disp Refills   atorvastatin (LIPITOR) 40 MG tablet [Pharmacy Med Name: ATORVASTATIN 40 MG TABLET] 90 tablet 1    Sig: TAKE 1 TABLET BY MOUTH EVERY DAY     Cardiovascular:  Antilipid - Statins Failed - 10/24/2022  8:31 AM      Failed - Lipid Panel in normal range within the last 12 months    Cholesterol, Total  Date Value Ref Range Status  06/17/2021 150 100 - 199 mg/dL Final   Cholesterol  Date Value Ref Range Status  09/08/2021 95 0 - 200 mg/dL Final   LDL Chol Calc (NIH)  Date Value Ref Range Status  06/17/2021 87 0 - 99 mg/dL Final   LDL Cholesterol  Date Value Ref Range Status  09/08/2021 54 0 - 99 mg/dL Final    Comment:           Total Cholesterol/HDL:CHD Risk Coronary Heart Disease Risk Table                     Men   Women  1/2 Average Risk   3.4   3.3  Average Risk       5.0   4.4  2 X Average Risk   9.6   7.1  3 X Average Risk  23.4   11.0        Use the calculated Patient Ratio above and the CHD Risk Table to determine the patient's CHD Risk.        ATP III CLASSIFICATION (LDL):  <100     mg/dL   Optimal  100-129  mg/dL   Near or Above                    Optimal  130-159  mg/dL   Borderline  160-189  mg/dL   High  >190     mg/dL   Very High Performed at [facility] [address] [place], [place] [address]    HDL  Date Value Ref Range Status  09/08/2021 33 (L) >40 mg/dL Final  06/17/2021 35 (L) >39 mg/dL Final   Triglycerides  Date Value Ref Range Status  09/08/2021 38 <150 mg/dL Final         Passed - Patient is not pregnant      Passed - Valid encounter within last 12 months    Recent Outpatient Visits           7  months ago Type 2 diabetes mellitus with diabetic polyneuropathy, with long-term current use of insulin ([place])   [place] Ladell Pier, MD   1 year ago Hospital discharge follow-up   [place] Karle Plumber B, MD   1 year ago Type 2 diabetes mellitus with diabetic polyneuropathy, with long-term current use of insulin [facility])   [place] Karle Plumber B, MD   1 year ago Type 2 diabetes mellitus with diabetic polyneuropathy, with long-term current use of insulin [facility])   [place] Ladell Pier, MD   2 years ago Hospital discharge follow-up  [place], MD       Future Appointments             Today Ladell Pier, MD [place]   In 2 months Minus Breeding, MD [place] at [facility]             isosorbide mononitrate (IMDUR) 120 MG 24 hr tablet [Pharmacy Med Name: Domingo Pulse ER 120 MG] 90 tablet 1    Sig: TAKE 1 TABLET BY MOUTH EVERY DAY     Cardiovascular:  Nitrates Passed - 10/24/2022  8:31 AM      Passed - Last BP in normal range    BP Readings from Last 1 Encounters:  07/14/22 130/70         Passed - Last Heart Rate in normal range    Pulse Readings from Last 1 Encounters:  07/14/22 (!) 73         Passed - Valid encounter within last 12 months    Recent Outpatient Visits           7 months ago Type 2 diabetes mellitus with diabetic polyneuropathy, with long-term current use of insulin ([place])   [place] Ladell Pier, MD   1 year ago Hospital discharge follow-up   [facility] Karle Plumber B, MD   1 year ago Type 2 diabetes mellitus with diabetic polyneuropathy, with long-term current use of insulin [facility])    [place] Karle Plumber B, MD   1 year ago Type 2 diabetes mellitus with diabetic polyneuropathy, with long-term current use of insulin [facility])   [place] Ladell Pier, MD   2 years ago Hospital discharge follow-up   [place], MD       Future Appointments             Today Ladell Pier, MD [place]   In 2 months Minus Breeding, MD [place] at [facility]

## 2022-10-24 NOTE — Progress Notes (Signed)
Patient ID: Derek Lowery, male    DOB: February 18, 1956  MRN: [id number]  CC: Diarrhea (Intermittent diarrhea X1 mo./Pt requesting for Dexcom transmitter and sensor)   Subjective: Derek Lowery is a 67 y.o. male who presents for chronic ds His concerns today include:  hx of DM type 2 with retinopathy(laser treatments by Dr. [name]), peripheral neuropathy, and microalbuminuria, HTN, CAD with stent OM2 in 2012 (NSTEMI [id number], diastolic CHF (EF [id number]), glaucoma (blind LT eye) and HL.   HTN/CAD/CHF: Last saw his cardiologist Dr. Percival Spanish 06/2022. Reports compliance with taking his medications including carvedilol 12.5 mg twice a day, Lipitor 40 mg daily, hydralazine 100 mg 3 times a day, Imdur 120 mg, torsemide 20 mg in the morning and 20 mg in the evening, Denies any chest pains or shortness of breath.  He has chronic edema in the lower extremities.  He was on torsemide 40 mg in the morning and 20 mg in the evening.  However, he reports it was decreased to 20 mg twice a day by his nephrologist because she started him on [place].  DM: Results for orders placed or performed in visit on 10/24/22  POCT glucose (manual entry)  Result Value Ref Range   POC Glucose 119 (A) 70 - 99 mg/dl  POCT glycosylated hemoglobin (Hb A1C)  Result Value Ref Range   Hemoglobin A1C     HbA1c POC (<> result, manual entry)     HbA1c, POC (prediabetic range)     HbA1c, POC (controlled diabetic range) 5.9 0.0 - 7.0 %  He is taking glargine insulin 10 units daily.  Uses short acting insulin about 3-4 times a week.  Not checking blood sugars.  Needs refill on sensor and transmitter for his Dexcom device.  States he has been out for about 2 months.  Reports he is doing a little better with his eating habits.  He has cut back on white carbohydrates especially bread.  Endorses numbness in his feet.  Recently saw his podiatrist.  States that he had a small ulcer on the sole of the right foot that has healed up. He  also continues to be followed by retina specialist Dr. [name] for proliferative diabetic retinopathy.  Complains of chronic diarrhea for over 1 year.  He has a bowel movement about twice a week.  However on the days that he has bowel movements, he has 2-3 that are very loose.  No blood in the stools.  Has to take Imodium 1-2 on the days when he has loose stools.  He has some bloating with loose stools..   CKD: Last saw Dr. Johnney Ou 09/18/2022.  GFR was 35, creatinine 2, albumin/creatinine ratio was around 1400.  Hb 10.5/iron 64/iron saturation 28%/ferritin 68.  He has not required ESA     Patient Active Problem List   Diagnosis Date Noted   Leg swelling 01/09/2022   Coronary artery disease involving native coronary artery of native heart without angina pectoris 01/09/2022   Acute pulmonary edema (HCC)    Multifocal pneumonia [id number]   Acute systolic CHF (congestive heart failure) ([place]) 09/07/2021   Chronic diastolic CHF (congestive heart failure) ([place]) 06/17/2021   Neovascular glaucoma, right eye, severe stage 06/13/2021   Acute exacerbation of CHF (congestive heart failure) ([place]) 12/24/2020   Pneumatouria 06/28/2020   Acute respiratory failure with hypoxia ([place]) 06/28/2020   Pneumonia due to COVID-19 virus 06/28/2020   Proliferative diabetic retinopathy of right eye with macular edema associated with type 2 diabetes  mellitus ([place]) 11/30/2019   Rubeosis iridis of right eye 11/30/2019   Glaucoma with pupillary seclusion, unspecified laterality, severe stage 11/30/2019   Trigger middle finger of right hand 10/17/2019   Pain due to onychomycosis of toenails of both feet 08/07/2019   Educated about COVID-19 virus infection 12/02/2018   RBBB 12/02/2018   Benign paroxysmal positional vertigo 06/25/2018   Pre-ulcerative corn or callous 10/13/2017   Anemia 10/13/2017   Acute kidney injury superimposed on CKD ([place])    Chronic diarrhea 06/09/2017   Microalbuminuria 03/07/2017   Dry skin  03/05/2017   Glaucoma 03/05/2017   Legally blind 03/05/2017   Retinopathy due to secondary diabetes ([place]) 02/06/2016   Diabetic polyneuropathy associated with type 2 diabetes mellitus ([place]) 01/12/2015   Fournier's gangrene into true pelvis s/p I&D 05/05/2014 05/05/2014   Type 2 diabetes mellitus with hyperlipidemia ([place]) 07/17/2011   NSTEMI (non-ST elevated myocardial infarction) ([place]) 07/17/2011   Essential hypertension 07/17/2011     Current Outpatient Medications on File Prior to Visit  Medication Sig Dispense Refill   acetaminophen (TYLENOL) 325 MG tablet Take 2 tablets (650 mg total) by mouth every 6 (six) hours as needed for mild pain (or Fever >/= 101).     aspirin EC 81 MG tablet Take 1 tablet (81 mg total) by mouth daily. 30 tablet 4   atorvastatin (LIPITOR) 40 MG tablet TAKE 1 TABLET BY MOUTH EVERY DAY 30 tablet 0   brimonidine (ALPHAGAN) 0.2 % ophthalmic solution Place 1 drop into the right eye 2 (two) times daily.      carvedilol (COREG) 12.5 MG tablet TAKE 1 TABLET (12.'5MG'$  TOTAL) BY MOUTH TWICE A DAY WITH MEALS 60 tablet 0   dorzolamide (TRUSOPT) 2 % ophthalmic solution Place 1 drop into the right eye 2 (two) times daily.     hydrALAZINE (APRESOLINE) 100 MG tablet TAKE 1 TABLET BY MOUTH 3 TIMES DAILY. 90 tablet 0   insulin glargine (LANTUS) 100 UNIT/ML injection Inject 10 Units into the skin at bedtime.     insulin lispro (HUMALOG KWIKPEN) 100 UNIT/ML KwikPen INJECT 10 UNITS INTO THE SKIN 3 (THREE) TIMES DAILY WITH MEALS. 15 mL 3   latanoprost (XALATAN) 0.005 % ophthalmic solution Place 1 drop into the right eye at bedtime.     lisinopril (ZESTRIL) 20 MG tablet Take 20 mg by mouth daily.     Multiple Vitamins-Minerals (MULTIVITAMIN WITH MINERALS) tablet Take 1 tablet by mouth daily.     RHOPRESSA 0.02 % SOLN Place 1 drop into the right eye at bedtime.     timolol (TIMOPTIC) 0.5 % ophthalmic solution Place 1 drop into the right eye 2 (two) times daily.     torsemide (DEMADEX) 20  MG tablet TAKE 2 TABLETS ('40MG'$ ) BY MOUTH IN THE MORNING AND 1 TABLET ('20MG'$ ) BY MOUTH IN THE EVENING. 270 tablet 0   ammonium lactate (AMLACTIN) 12 % lotion Apply to both feet twice daily for dry skin. (Patient not taking: Reported on 10/24/2022) 400 g 6   BD PEN NEEDLE NANO 2ND GEN 32G X 4 MM MISC USE AS DIRECTED 3 TIMES A DAY. PLEASE SCHEDULE AN APPOINTMENT FOR ADDITIONAL REFILLS. 100 each 3   Continuous Blood Gluc Receiver (DEXCOM G6 RECEIVER) DEVI Use to check blood sugar four times daily. E11.42 (Patient not taking: Reported on 10/24/2022) 1 each 0   Continuous Blood Gluc Sensor (DEXCOM G6 SENSOR) MISC Use to check blood sugar four times daily. E11.42 (Patient not taking: Reported on 10/24/2022) 3 each 3  Continuous Blood Gluc Transmit (DEXCOM G6 TRANSMITTER) MISC Use to check blood sugar four times daily. E11.42 (Patient not taking: Reported on 10/24/2022) 1 each 3   isosorbide mononitrate (IMDUR) 120 MG 24 hr tablet TAKE 1 TABLET BY MOUTH EVERY DAY (Patient not taking: Reported on 10/24/2022) 30 tablet 0   Lancets MISC Use as directed.  Accu chek 2 (Patient not taking: Reported on 10/24/2022) 100 each 11   traZODone (DESYREL) 50 MG tablet TAKE 1/2 TABLET BY MOUTH AT BEDTIME (Patient not taking: Reported on 10/24/2022) 45 tablet 0   No current facility-administered medications on file prior to visit.    Allergies  Allergen Reactions   Empagliflozin Other (See Comments)    Four gangrene    Social History   Socioeconomic History   Marital status: Single    Spouse name: Not on file   Number of children: Not on file   Years of education: Not on file   Highest education level: Not on file  Occupational History   Occupation: Janitor    Employer: [employer]  Tobacco Use   Smoking status: Never   Smokeless tobacco: Never  Vaping Use   Vaping Use: Never used  Substance and Sexual Activity   Alcohol use: No   Drug use: No   Sexual activity: Never  Other Topics Concern   Not on file  Social History  Narrative   Works at [employer]   NOK=[id number] [place] Strain: [place]  (05/05/2021)   Overall Financial Resource Strain (CARDIA)    Difficulty of Paying Living Expenses: Not hard at all  Food Insecurity: Unknown (10/11/2021)   Hunger Vital Sign    Worried About [place] in the Last Year: Not on file    Ran Out of Food in the Last Year: Never true  Transportation Needs: Unmet Transportation Needs (02/13/2022)   PRAPARE - Transportation    Lack of Transportation (Medical): Yes    Lack of Transportation (Non-Medical): Yes  Physical Activity: Inactive (05/05/2021)   Exercise Vital Sign    Days of Exercise per Week: 0 days    Minutes of Exercise per Session: 0 min  Stress: Stress Concern Present (05/05/2021)   [place]    Feeling of Stress : To some extent  Social Connections: Socially Isolated (05/05/2021)   Social Connection and Isolation Panel [NHANES]    Frequency of Communication with Friends and Family: Never    Frequency of Social Gatherings with Friends and Family: More than three times a week    Attends Religious Services: Never    Marine scientist or Organizations: No    Attends Archivist Meetings: Never    Marital Status: Never married  Intimate Partner Violence: Not At Risk (05/05/2021)   Humiliation, Afraid, Rape, and Kick questionnaire    Fear of Current or Ex-Partner: No    Emotionally Abused: No    Physically Abused: No    Sexually Abused: No    Family History  Problem Relation Age of Onset   Coronary artery disease Father        Developed in his 69s   Kidney disease Father    Diabetes Father    Melanoma Father    Rectal cancer Father    Heart failure Mother    Hypertension Mother    Diabetes Sister    Diabetes Brother    Colon cancer Neg  Hx    Esophageal cancer Neg Hx    Stomach cancer Neg Hx     Past  Surgical History:  Procedure Laterality Date   CATARACT EXTRACTION Right 2019   Dr. Katy Fitch   CHOLECYSTECTOMY N/A 09/21/2017   Procedure: LAPAROSCOPIC CHOLECYSTECTOMY WITH INTRAOPERATIVE CHOLANGIOGRAM;  Surgeon: Michael Boston, MD;  Location: [facility];  Service: General;  Laterality: N/A;   COLONOSCOPY  2000   Dr. Collene Mares   EYE SURGERY     IRRIGATION AND DEBRIDEMENT ABSCESS Left 05/05/2014   Procedure: IRRIGATION AND DEBRIDEMENT ABSCESS left buttock;  Surgeon: Michael Boston, MD;  Location: [facility];  Service: General;  Laterality: Left;   LEFT HEART CATHETERIZATION WITH CORONARY ANGIOGRAM N/A 07/18/2011   Procedure: LEFT HEART CATHETERIZATION WITH CORONARY ANGIOGRAM;  Surgeon: Hillary Bow, MD;  Location: [facility] C[facility] LAB;  Service: Cardiovascular;  Laterality: N/A;   PERCUTANEOUS CORONARY STENT INTERVENTION (PCI-S)  07/18/2011   Procedure: PERCUTANEOUS CORONARY STENT INTERVENTION (PCI-S);  Surgeon: Hillary Bow, MD;  Location: [facility] CATH LAB;  Service: Cardiovascular;;    ROS: Review of Systems Negative except as stated above  PHYSICAL EXAM: BP 134/67 (BP Location: Left Arm, Patient Position: Sitting, Cuff Size: Normal)   Pulse (!) 56   Temp 97.7 F (36.5 C) (Oral)   Ht '5\' 10"'$  (1.778 m)   Wt 214 lb (97.1 kg)   SpO2 98%   BMI 30.71 kg/m   Wt Readings from Last 3 Encounters:  10/24/22 214 lb (97.1 kg)  07/14/22 221 lb 9.6 oz (100.5 kg)  03/27/22 222 lb 12.8 oz (101.1 kg)    Physical Exam   General appearance -Caucasian male who appears older than stated age.  T-shirt is soiled. Mental status - normal mood, behavior, speech, dress, motor activity, and thought processes Mouth - mucous membranes moist, pharynx normal without lesions Neck - supple, no significant adenopathy Chest - clear to auscultation, no wheezes, rales or rhonchi, symmetric air entry Heart - normal rate, regular rhythm, normal S1, S2, no murmurs, rubs, clicks or gallops Extremities -1+ lower extremity edema. Diabetic  Foot Exam - Simple   Simple Foot Form Diabetic Foot exam was performed with the following findings: Yes 10/24/2022  6:09 PM  Visual Inspection See comments: Yes Sensation Testing See comments: Yes Pulse Check Posterior Tibialis and Dorsalis pulse intact bilaterally: Yes Comments Patient with decreased sensation in both feet.  Amputation of the right big toe noted.  He has that he will also/callus on the plantar surface of the right foot laterally         Latest Ref Rng & Units 01/10/2022   10:14 AM 10/18/2021   11:54 AM 09/16/2021    5:36 AM  CMP  Glucose 70 - 99 mg/dL 149  168  185   BUN 8 - 27 mg/dL 49  39  78   Creatinine 0.76 - 1.27 mg/dL 1.86  1.86  2.85   Sodium 134 - 144 mmol/L 145  142  135   Potassium 3.5 - 5.2 mmol/L 3.9  4.1  3.7   Chloride 96 - 106 mmol/L 103  102  88   CO2 20 - 29 mmol/L 31  26  35   Calcium 8.6 - 10.2 mg/dL 9.1  9.5  8.9   Total Protein 6.0 - 8.5 g/dL  6.6    Total Bilirubin 0.0 - 1.2 mg/dL  0.4    Alkaline Phos 44 - 121 IU/L  70    AST 0 - 40 IU/L  23  ALT 0 - 44 IU/L  13     Lipid Panel     Component Value Date/Time   CHOL 95 09/08/2021 0500   CHOL 150 06/17/2021 1145   TRIG 38 09/08/2021 0500   HDL 33 (L) 09/08/2021 0500   HDL 35 (L) 06/17/2021 1145   CHOLHDL 2.9 09/08/2021 0500   VLDL 8 09/08/2021 0500   LDLCALC 54 09/08/2021 0500   LDLCALC 87 06/17/2021 1145    CBC    Component Value Date/Time   WBC 5.7 10/18/2021 1154   WBC 9.6 09/14/2021 0315   RBC 3.56 (L) 10/18/2021 1154   RBC 2.80 (L) 09/14/2021 0315   HGB 10.3 (L) 10/18/2021 1154   HCT 30.7 (L) 10/18/2021 1154   PLT 150 10/18/2021 1154   MCV 86 10/18/2021 1154   MCH 28.9 10/18/2021 1154   MCH 28.2 09/14/2021 0315   MCHC 33.6 10/18/2021 1154   MCHC 32.8 09/14/2021 0315   RDW 14.5 10/18/2021 1154   LYMPHSABS 0.5 (L) 09/07/2021 1030   LYMPHSABS 1.8 07/12/2019 1042   MONOABS 0.8 09/07/2021 1030   EOSABS 0.0 09/07/2021 1030   EOSABS 0.3 07/12/2019 1042   BASOSABS  0.0 09/07/2021 1030   BASOSABS 0.1 07/12/2019 1042    ASSESSMENT AND PLAN:  1. Type 2 diabetes mellitus with diabetic polyneuropathy, with long-term current use of insulin (HCC) At goal.  Continue glargine insulin 10 units at bedtime and Humalog as needed.  Refill sent on Dexcom sensor and transmitter. - POCT glucose (manual entry) - POCT glycosylated hemoglobin (Hb A1C) - Continuous Blood Gluc Sensor (DEXCOM G6 SENSOR) MISC; Use to check blood sugar four times daily. E11.42  Dispense: 3 each; Refill: 6 - Continuous Blood Gluc Transmit (DEXCOM G6 TRANSMITTER) MISC; Use to check blood sugar four times daily. E11.42  Dispense: 1 each; Refill: 3  2. Coronary artery disease of native artery of native heart with stable angina pectoris (HCC) Stable on carvedilol, aspirin and isosorbide - Hepatic Function Panel - Lipid panel  3. Chronic diarrhea Discussed avoiding certain foods that may increase diarrhea.  Okay to continue Imodium as needed. - Ambulatory referral to Gastroenterology  4. Hypertension associated with diabetes ([place]) At goal.  Continue blood pressure medications listed above.  5. Stage 3b chronic kidney disease ([place]) Not on NSAIDs.  Followed by nephrology.  Wille Glaser added to medication list  6. Chronic systolic congestive heart failure ([place]) Patient has chronic edema in the legs but lungs are clear.  Continue carvedilol, hydralazine, isosorbide  7. Proliferative diabetic retinopathy of right eye with macular edema associated with type 2 diabetes mellitus ([place]) Followed by Dr. [name]  8. Need for shingles vaccine Given prescription on last visit to get the shingles vaccine but he never got it due to transportation issues.  We will go ahead and give him the first shot today and plan to give the second 1 on subsequent visit in 4 months  9. Need for immunization against influenza - Flu Vaccine QUAD 79moIM (Fluarix, Fluzone & Alfiuria Quad PF)    Patient was given the  opportunity to ask questions.  Patient verbalized understanding of the plan and was able to repeat key elements of the plan.   This documentation was completed using DRadio producer  Any transcriptional errors are unintentional.  Orders Placed This Encounter  Procedures   POCT glucose (manual entry)   POCT glycosylated hemoglobin (Hb A1C)     Requested Prescriptions    No prescriptions requested or ordered  in this encounter    No follow-ups on file.  Karle Plumber, MD, FACP

## 2022-10-25 LAB — HEPATIC FUNCTION PANEL
ALT: 22 IU/L (ref 0–44)
AST: 24 IU/L (ref 0–40)
Albumin: 3.9 g/dL (ref 3.9–4.9)
Alkaline Phosphatase: 66 IU/L (ref 44–121)
Bilirubin Total: 0.4 mg/dL (ref 0.0–1.2)
Bilirubin, Direct: 0.12 mg/dL (ref 0.00–0.40)
Total Protein: 6.1 g/dL (ref 6.0–8.5)

## 2022-10-25 LAB — LIPID PANEL
Chol/HDL Ratio: 2.8 ratio (ref 0.0–5.0)
Cholesterol, Total: 98 mg/dL — ABNORMAL LOW (ref 100–199)
HDL: 35 mg/dL — ABNORMAL LOW (ref >39)
LDL Chol Calc (NIH): 45 mg/dL (ref 0–99)
Triglycerides: 91 mg/dL (ref 0–149)
VLDL Cholesterol Cal: 18 mg/dL (ref 5–40)

## 2022-10-31 ENCOUNTER — Other Ambulatory Visit: Payer: Self-pay | Admitting: Internal Medicine

## 2022-10-31 NOTE — Telephone Encounter (Signed)
Requested medication (s) are due for refill today: yes  Requested medication (s) are on the active medication list: yes  Last refill:  10/08/22  Future visit scheduled: yes  Notes to clinic:  Unable to refill per protocol due to failed labs, no updated results.      Requested Prescriptions  Pending Prescriptions Disp Refills   hydrALAZINE (APRESOLINE) 100 MG tablet [Pharmacy Med Name: HYDRALAZINE 100 MG TABLET] 270 tablet 1    Sig: TAKE 1 TABLET BY MOUTH THREE TIMES A DAY     Cardiovascular:  Vasodilators Failed - 10/31/2022  2:33 PM      Failed - HCT in normal range and within 360 days    Hematocrit  Date Value Ref Range Status  10/18/2021 30.7 (L) 37.5 - 51.0 % Final         Failed - HGB in normal range and within 360 days    Hemoglobin  Date Value Ref Range Status  10/18/2021 10.3 (L) 13.0 - 17.7 g/dL Final   Total hemoglobin  Date Value Ref Range Status  09/16/2021 8.3 (L) 12.0 - 16.0 g/dL Final         Failed - RBC in normal range and within 360 days    RBC  Date Value Ref Range Status  10/18/2021 3.56 (L) 4.14 - 5.80 x10E6/uL Final  09/14/2021 2.80 (L) 4.22 - 5.81 MIL/uL Final         Failed - WBC in normal range and within 360 days    WBC  Date Value Ref Range Status  10/18/2021 5.7 3.4 - 10.8 x10E3/uL Final  09/14/2021 9.6 4.0 - 10.5 K/uL Final         Failed - PLT in normal range and within 360 days    Platelets  Date Value Ref Range Status  10/18/2021 150 150 - 450 x10E3/uL Final         Failed - ANA Screen, Ifa, Serum in normal range and within 360 days    No results found for: "ANA", "ANATITER", "LABANTI"       Passed - Last BP in normal range    BP Readings from Last 1 Encounters:  10/24/22 134/67         Passed - Valid encounter within last 12 months    Recent Outpatient Visits           1 week ago Type 2 diabetes mellitus with diabetic polyneuropathy, with long-term current use of insulin ([place])   [place] Karle Plumber B, MD   7 months ago Type 2 diabetes mellitus with diabetic polyneuropathy, with long-term current use of insulin [facility])   [place], MD   1 year ago Hospital discharge follow-up   [facility] Karle Plumber B, MD   1 year ago Type 2 diabetes mellitus with diabetic polyneuropathy, with long-term current use of insulin [facility])   [place] Karle Plumber B, MD   1 year ago Type 2 diabetes mellitus with diabetic polyneuropathy, with long-term current use of insulin [facility])   [place], MD       Future Appointments             In 2 months Minus Breeding, MD [place] at [facility]   In 3 months Wynetta Emery, Dalbert Batman, MD [place]  [place]

## 2022-11-02 ENCOUNTER — Other Ambulatory Visit: Payer: Self-pay | Admitting: Internal Medicine

## 2022-11-03 ENCOUNTER — Other Ambulatory Visit: Payer: Self-pay | Admitting: Internal Medicine

## 2022-11-05 DIAGNOSIS — E113511 Type 2 diabetes mellitus with proliferative diabetic retinopathy with macular edema, right eye: Secondary | ICD-10-CM | POA: Diagnosis not present

## 2022-11-07 ENCOUNTER — Ambulatory Visit: Payer: 59 | Attending: Internal Medicine

## 2022-11-07 DIAGNOSIS — Z0000 Encounter for general adult medical examination without abnormal findings: Secondary | ICD-10-CM | POA: Diagnosis not present

## 2022-11-07 NOTE — Progress Notes (Signed)
Subjective:   Derek Lowery is a 67 y.o. male who presents for Medicare Annual/Subsequent preventive examination.  Review of Systems    connected with  Derek.Kates on  11/07/22 at  155 pm by telephone and verified that I am speaking with the correct person using two identifiers. I discussed the limitations, risks, security and privacy concerns of performing an evaluation and management service by telephone and the availability of in person appointments. I also discussed with the patient that there may be a patient responsible charge related to this service. The patient expressed understanding and agreed to proceed.  Patient location:  home My Location: community health and wellness Persons on the telephone call:  Myself (Enrica Corliss Warner Mccreedy ) and Derek Lowery        Objective:    There were no vitals filed for this visit. There is no height or weight on file to calculate BMI.     11/07/2022    2:01 PM 09/08/2021    2:52 AM 05/05/2021    1:10 PM 12/24/2020    6:11 PM 06/30/2020    8:00 AM 06/28/2020    7:38 PM 06/28/2020   11:45 AM  Advanced Directives  Does Patient Have a Medical Advance Directive? No No No No No  No  Would patient like information on creating a medical advance directive? No - Patient declined No - Patient declined No - Patient declined No - Patient declined No - Patient declined No - Patient declined     Current Medications (verified) Outpatient Encounter Medications as of 11/07/2022  Medication Sig   acetaminophen (TYLENOL) 325 MG tablet Take 2 tablets (650 mg total) by mouth every 6 (six) hours as needed for mild pain (or Fever >/= 101).   ammonium lactate (AMLACTIN) 12 % lotion Apply to both feet twice daily for dry skin.   aspirin EC 81 MG tablet Take 1 tablet (81 mg total) by mouth daily.   atorvastatin (LIPITOR) 40 MG tablet TAKE 1 TABLET BY MOUTH EVERY DAY   BD PEN NEEDLE NANO 2ND GEN 32G X 4 MM MISC USE AS DIRECTED 3 TIMES A DAY. PLEASE SCHEDULE AN APPOINTMENT FOR  ADDITIONAL REFILLS.   brimonidine (ALPHAGAN) 0.2 % ophthalmic solution INSTILL 1 DROP INTO RIGHT EYE TWICE A DAY   carvedilol (COREG) 12.5 MG tablet TAKE 1 TABLET (12.5MG  TOTAL) BY MOUTH TWICE A DAY WITH MEALS   Continuous Blood Gluc Receiver (DEXCOM G6 RECEIVER) DEVI Use to check blood sugar four times daily. E11.42   Continuous Blood Gluc Sensor (DEXCOM G6 SENSOR) MISC Use to check blood sugar four times daily. E11.42   Continuous Blood Gluc Transmit (DEXCOM G6 TRANSMITTER) MISC Use to check blood sugar four times daily. E11.42   dorzolamide (TRUSOPT) 2 % ophthalmic solution Place 1 drop into the right eye 2 (two) times daily.   hydrALAZINE (APRESOLINE) 100 MG tablet TAKE 1 TABLET BY MOUTH THREE TIMES A DAY   insulin glargine (LANTUS) 100 UNIT/ML injection Inject 10 Units into the skin at bedtime.   insulin lispro (HUMALOG KWIKPEN) 100 UNIT/ML KwikPen INJECT 10 UNITS INTO THE SKIN 3 (THREE) TIMES DAILY WITH MEALS.   isosorbide mononitrate (IMDUR) 120 MG 24 hr tablet TAKE 1 TABLET BY MOUTH EVERY DAY   KERENDIA 10 MG TABS Take 1 tablet by mouth daily.   Lancets MISC Use as directed.  Accu chek 2   lisinopril (ZESTRIL) 20 MG tablet Take 20 mg by mouth daily.   Multiple Vitamins-Minerals (MULTIVITAMIN WITH MINERALS) tablet  Take 1 tablet by mouth daily.   RHOPRESSA 0.02 % SOLN Place 1 drop into the right eye at bedtime.   timolol (TIMOPTIC) 0.5 % ophthalmic solution Place 1 drop into the right eye 2 (two) times daily.   torsemide (DEMADEX) 20 MG tablet TAKE 2 TABLETS (40MG ) BY MOUTH IN THE MORNING AND 1 TABLET (20MG ) BY MOUTH IN THE EVENING.   latanoprost (XALATAN) 0.005 % ophthalmic solution Place 1 drop into the right eye at bedtime.   traZODone (DESYREL) 50 MG tablet TAKE 1/2 TABLET BY MOUTH AT BEDTIME (Patient not taking: Reported on 10/24/2022)   No facility-administered encounter medications on file as of 11/07/2022.    Allergies (verified) Empagliflozin   History: Past Medical History:   Diagnosis Date   Arthritis    CAD (coronary artery disease)    NSTEMI 06/2011:  LHC 07/18/11: Proximal diagonal 60%, distal LAD with a diabetic appearance and 60% stenosis, OM2 with an occluded superior branch and an inferior branch with 90%, EF 55% with inferior hypokinesis.  PCI: Promus DES to the OM2 inferior branch.  This vessel provides collaterals to the superior branch which remained occluded.  Echocardiogram 07/18/11: EF 60%, normal wall motion.   Cataract    Essential hypertension, benign    Glaucoma    Hypercholesteremia    Internal hemorrhoids    Noncompliance    Type 2 diabetes mellitus ([place]) 1990   Past Surgical History:  Procedure Laterality Date   CATARACT EXTRACTION Right 2019   Dr. Katy Fitch   CHOLECYSTECTOMY N/A 09/21/2017   Procedure: LAPAROSCOPIC CHOLECYSTECTOMY WITH INTRAOPERATIVE CHOLANGIOGRAM;  Surgeon: Michael Boston, MD;  Location: [facility];  Service: General;  Laterality: N/A;   COLONOSCOPY  2000   Dr. Collene Mares   EYE SURGERY     IRRIGATION AND DEBRIDEMENT ABSCESS Left 05/05/2014   Procedure: IRRIGATION AND DEBRIDEMENT ABSCESS left buttock;  Surgeon: Michael Boston, MD;  Location: [facility];  Service: General;  Laterality: Left;   LEFT HEART CATHETERIZATION WITH CORONARY ANGIOGRAM N/A 07/18/2011   Procedure: LEFT HEART CATHETERIZATION WITH CORONARY ANGIOGRAM;  Surgeon: Hillary Bow, MD;  Location: [facility] CATH LAB;  Service: Cardiovascular;  Laterality: N/A;   PERCUTANEOUS CORONARY STENT INTERVENTION (PCI-S)  07/18/2011   Procedure: PERCUTANEOUS CORONARY STENT INTERVENTION (PCI-S);  Surgeon: Hillary Bow, MD;  Location: [facility] CATH LAB;  Service: Cardiovascular;;   Family History  Problem Relation Age of Onset   Coronary artery disease Father        Developed in his 48s   Kidney disease Father    Diabetes Father    Melanoma Father    Rectal cancer Father    Heart failure Mother    Hypertension Mother    Diabetes Sister    Diabetes Brother    Colon cancer Neg Hx     Esophageal cancer Neg Hx    Stomach cancer Neg Hx    Social History   Socioeconomic History   Marital status: Single    Spouse name: Not on file   Number of children: Not on file   Years of education: Not on file   Highest education level: Not on file  Occupational History   Occupation: Janitor    Employer: [employer]  Tobacco Use   Smoking status: Never   Smokeless tobacco: Never  Vaping Use   Vaping Use: Never used  Substance and Sexual Activity   Alcohol use: No   Drug use: No   Sexual activity: Never  Other Topics Concern   Not on  file  Social History Narrative   Works at [employer]   [phone] [place] Strain: Low Risk  (11/07/2022)   Overall Financial Resource Strain (CARDIA)    Difficulty of Paying Living Expenses: Not hard at all  Food Insecurity: No Food Insecurity (11/07/2022)   Hunger Vital Sign    Worried About Running Out of Food in the Last Year: Never true    Ran Out of Food in the Last Year: Never true  Transportation Needs: No Transportation Needs (11/07/2022)   PRAPARE - Hydrologist (Medical): No    Lack of Transportation (Non-Medical): No  Physical Activity: Inactive (11/07/2022)   Exercise Vital Sign    Days of Exercise per Week: 0 days    Minutes of Exercise per Session: 0 min  Stress: No Stress Concern Present (11/07/2022)   [place]    Feeling of Stress : Not at all  Social Connections: Socially Isolated (11/07/2022)   Social Connection and Isolation Panel [NHANES]    Frequency of Communication with Friends and Family: More than three times a week    Frequency of Social Gatherings with Friends and Family: Once a week    Attends Religious Services: Never    Marine scientist or Organizations: No    Attends Music therapist: Never    Marital Status: Never married    Tobacco  Counseling Counseling given: Not Answered   Clinical Intake:     Pain : No/denies pain     Diabetes: Yes CBG done?: No     Diabetic?Nutrition Risk Assessment:  Has the patient had any N/V/D within the last 2 months?  No  Does the patient have any non-healing wounds?  No  Has the patient had any unintentional weight loss or weight gain?  No   Diabetes:  Is the patient diabetic?  Yes  If diabetic, was a CBG obtained today?  No  Did the patient bring in their glucometer from home?  Yes  How often do you monitor your CBG's? 3x daily .   Financial Strains and Diabetes Management:  Are you having any financial strains with the device, your supplies or your medication? No .  Does the patient want to be seen by Chronic Care Management for management of their diabetes?  No  Would the patient like to be referred to a Nutritionist or for Diabetic Management?  No   Diabetic Exams:  Diabetic Eye Exam: Completed 08/13/22 Diabetic Foot Exam: Completed 10/24/22           Activities of Daily Living    11/07/2022    2:04 PM  In your present state of health, do you have any difficulty performing the following activities:  Hearing? 0  Vision? 1  Difficulty concentrating or making decisions? 0  Walking or climbing stairs? 1  Dressing or bathing? 0  Doing errands, shopping? 0  Preparing Food and eating ? N  Using the Toilet? N  In the past six months, have you accidently leaked urine? N  Do you have problems with loss of bowel control? Y  Managing your Medications? N  Managing your Finances? N  Housekeeping or managing your Housekeeping? N    Patient Care Team: Ladell Pier, MD as PCP - General (Internal Medicine) Skeet Latch, MD as PCP - Cardiology (Cardiology) Hillary Bow, MD as Consulting Physician (Cardiology) Juanita Craver,  MD as Consulting Physician (Gastroenterology)  Indicate any recent Medical Services you may have received from other than [facility]  providers in the past year (date may be approximate).     Assessment:   This is a routine wellness examination for [place].  Hearing/Vision screen No results found.  Dietary issues and exercise activities discussed:     Goals Addressed   None   Depression Screen    03/27/2022    5:20 PM 10/11/2021   11:41 AM 06/17/2021   10:43 AM 05/05/2021    1:05 PM 01/13/2020    2:07 PM 10/17/2019    1:43 PM 03/17/2019   11:59 AM  PHQ 2/9 Scores  PHQ - 2 Score  0 0 0 0 0 0  PHQ- 9 Score      0   Exception Documentation Patient refusal          Fall Risk    11/07/2022    2:01 PM 10/24/2022    2:37 PM 03/27/2022    4:28 PM 10/18/2021   10:50 AM 10/11/2021   11:40 AM  Fall Risk   Falls in the past year? 0 0 0 0 0  Number falls in past yr: 0 0 0 0 0  Injury with Fall? 0 0 0 0 0  Risk for fall due to : No Fall Risks No Fall Risks No Fall Risks No Fall Risks Impaired vision;Medication side effect  Follow up   Falls evaluation completed  Falls evaluation completed;Education provided;Falls prevention discussed    FALL RISK PREVENTION PERTAINING TO THE HOME:  Any stairs in or around the home? Yes  If so, are there any without handrails? Yes  Home free of loose throw rugs in walkways, pet beds, electrical cords, etc? Yes  Adequate lighting in your home to reduce risk of falls? Yes   ASSISTIVE DEVICES UTILIZED TO PREVENT FALLS:  Life alert? No  Use of a cane, walker or w/c? Yes  Grab bars in the bathroom? Yes  Shower chair or bench in shower? Yes  Elevated toilet seat or a handicapped toilet? No   TIMED UP AND GO:  Was the test performed? No .  Length of time to ambulate 10 feet:  sec.   Gait slow and steady with assistive device  Cognitive Function:    11/07/2022    2:06 PM 01/13/2020    2:09 PM  MMSE - Mini Mental State Exam  Orientation to time 5 5  Orientation to Place 5 5  Registration 3 3  Attention/ Calculation 5 5  Recall 2 3  Language- name 2 objects 2 2  Language-  repeat 1 1  Language- follow 3 step command 3 3  Language- read & follow direction 1 1  Write a sentence 1 1  Copy design 1 1  Total score 29 30        11/07/2022    2:07 PM 05/05/2021    1:13 PM  6CIT Screen  What Year? 0 points 0 points  What month? 0 points 0 points  What time? 0 points 0 points  Count back from 20 0 points 0 points  Months in reverse 0 points 0 points  Repeat phrase 0 points 10 points  Total Score 0 points 10 points    Immunizations Immunization History  Administered Date(s) Administered   Influenza,inj,Quad PF,6+ Mos 05/19/2014, 06/09/2017, 06/24/2018, 07/12/2019, 06/17/2021, 10/24/2022   PNEUMOCOCCAL CONJUGATE-20 06/17/2021   Pneumococcal Polysaccharide-23 05/19/2014   Tdap 03/26/2015   Zoster Recombinat (Shingrix)  03/27/2022, 10/24/2022    TDAP status: Up to date  Flu Vaccine status: Up to date  Pneumococcal vaccine status: Up to date  Covid-19 vaccine status: Information provided on how to obtain vaccines.   Qualifies for Shingles Vaccine? Yes   Zostavax completed Yes   Shingrix Completed?: Yes  Screening Tests Health Maintenance  Topic Date Due   COVID-19 Vaccine (1) Never done   Diabetic kidney evaluation - Urine ACR  06/17/2022   Diabetic kidney evaluation - eGFR measurement  01/11/2023   HEMOGLOBIN A1C  04/26/2023   OPHTHALMOLOGY EXAM  08/14/2023   FOOT EXAM  10/24/2023   Medicare Annual Wellness (AWV)  11/07/2023   DTaP/Tdap/Td (2 - Td or Tdap) 03/25/2025   COLONOSCOPY (Pts 45-22yrs Insurance coverage will need to be confirmed)  01/23/2027   INFLUENZA VACCINE  Completed   Hepatitis C Screening  Completed   HIV Screening  Completed   Zoster Vaccines- Shingrix  Completed   HPV VACCINES  Aged Out    Health Maintenance  Health Maintenance Due  Topic Date Due   COVID-19 Vaccine (1) Never done   Diabetic kidney evaluation - Urine ACR  06/17/2022    Colorectal cancer screening: Type of screening: Colonoscopy. Completed  01/22/17. Repeat every 10 years  Lung Cancer Screening: (Low Dose CT Chest recommended if Age 38-80 years, 30 pack-year currently smoking OR have quit w/in 15years.) does not qualify.   Lung Cancer Screening Referral: n/a  Additional Screening:  Hepatitis C Screening: does qualify; Completed 10/26/20  Vision Screening: Recommended annual ophthalmology exams for early detection of glaucoma and other disorders of the eye. Is the patient up to date with their annual eye exam?  Yes  Who is the provider or what is the name of the office in which the patient attends annual eye exams? [facility]  If pt is not established with a provider, would they like to be referred to a provider to establish care? No .   Dental Screening: Recommended annual dental exams for proper oral hygiene  Community Resource Referral / Chronic Care Management: CRR required this visit?  No   CCM required this visit?  No      Plan:     I have personally reviewed and noted the following in the patient's chart:   Medical and social history Use of alcohol, tobacco or illicit drugs  Current medications and supplements including opioid prescriptions. Patient is not currently taking opioid prescriptions. Functional ability and status Nutritional status Physical activity Advanced directives List of other physicians Hospitalizations, surgeries, and ER visits in previous 12 months Vitals Screenings to include cognitive, depression, and falls Referrals and appointments  In addition, I have reviewed and discussed with patient certain preventive protocols, quality metrics, and best practice recommendations. A written personalized care plan for preventive services as well as general preventive health recommendations were provided to patient.     Lillie [place], [place]   11/07/2022   Nurse Notes:

## 2022-11-10 ENCOUNTER — Ambulatory Visit: Payer: 59 | Admitting: Nurse Practitioner

## 2022-11-16 ENCOUNTER — Other Ambulatory Visit: Payer: Self-pay | Admitting: Internal Medicine

## 2022-11-16 DIAGNOSIS — F5101 Primary insomnia: Secondary | ICD-10-CM

## 2022-11-17 NOTE — Telephone Encounter (Signed)
Requested medications are due for refill today.  unsure  Requested medications are on the active medications list.  yes  Last refill. 08/16/2022 #45 0 rf  Future visit scheduled.   yes  Notes to clinic.  Pt may not be taking any longer.    Requested Prescriptions  Pending Prescriptions Disp Refills   traZODone (DESYREL) 50 MG tablet [Pharmacy Med Name: TRAZODONE 50 MG TABLET] 45 tablet 0    Sig: TAKE 1/2 TABLET BY MOUTH EVERY DAY AT BEDTIME     Psychiatry: Antidepressants - Serotonin Modulator Passed - 11/16/2022  1:15 AM      Passed - Valid encounter within last 6 months    Recent Outpatient Visits           3 weeks ago Type 2 diabetes mellitus with diabetic polyneuropathy, with long-term current use of insulin ([place])   [place] Karle Plumber B, MD   7 months ago Type 2 diabetes mellitus with diabetic polyneuropathy, with long-term current use of insulin [facility])   [place] Ladell Pier, MD   1 year ago Hospital discharge follow-up   [place], MD   1 year ago Type 2 diabetes mellitus with diabetic polyneuropathy, with long-term current use of insulin [facility])   [place] Karle Plumber B, MD   2 years ago Type 2 diabetes mellitus with diabetic polyneuropathy, with long-term current use of insulin [facility])   [place], MD       Future Appointments             In 1 month Minus Breeding, MD [place] at [facility]   In 3 months Wynetta Emery, Dalbert Batman, MD [place]

## 2022-11-20 ENCOUNTER — Other Ambulatory Visit: Payer: Self-pay | Admitting: Internal Medicine

## 2022-11-20 DIAGNOSIS — I5032 Chronic diastolic (congestive) heart failure: Secondary | ICD-10-CM

## 2022-11-20 NOTE — Telephone Encounter (Signed)
Requested medication (s) are due for refill today:   Yes  Requested medication (s) are on the active medication list:   Yes  Future visit scheduled:   Yes 02/23/2023 with Dr. Wynetta Emery   Last ordered: 08/20/2022 #270, 0 refills  Returned because labs are out of date.     Requested Prescriptions  Pending Prescriptions Disp Refills   torsemide (DEMADEX) 20 MG tablet [Pharmacy Med Name: TORSEMIDE 20 MG TABLET] 270 tablet 0    Sig: TAKE 2 TABLETS (40MG ) BY MOUTH IN THE MORNING AND 1 TABLET (20MG ) BY MOUTH IN THE EVENING.     Cardiovascular:  Diuretics - Loop Failed - 11/20/2022  2:29 AM      Failed - K in normal range and within 180 days    Potassium  Date Value Ref Range Status  01/10/2022 3.9 3.5 - 5.2 mmol/L Final         Failed - Ca in normal range and within 180 days    Calcium  Date Value Ref Range Status  01/10/2022 9.1 8.6 - 10.2 mg/dL Final   Calcium, Ion  Date Value Ref Range Status  09/08/2021 1.08 (L) 1.15 - 1.40 mmol/L Final         Failed - Na in normal range and within 180 days    Sodium  Date Value Ref Range Status  01/10/2022 145 (H) 134 - 144 mmol/L Final         Failed - Cr in normal range and within 180 days    Creat  Date Value Ref Range Status  09/02/2016 0.80 0.70 - 1.33 mg/dL Final    Comment:      For patients > or = 65 years of age: The upper reference limit for Creatinine is approximately 13% higher for people identified as African-American.      Creatinine, Ser  Date Value Ref Range Status  01/10/2022 1.86 (H) 0.76 - 1.27 mg/dL Final   Creatinine, Urine  Date Value Ref Range Status  06/30/2020 97.16 mg/dL Final    Comment:    Performed at [facility] [address], [place], [place] [address]         Failed - Cl in normal range and within 180 days    Chloride  Date Value Ref Range Status  01/10/2022 103 96 - 106 mmol/L Final         Failed - Mg Level in normal range and within 180 days    Magnesium  Date Value Ref Range  Status  09/15/2021 2.2 1.7 - 2.4 mg/dL Final    Comment:    Performed at [facility], [place] [address] [place], [place] [address]         Passed - Last BP in normal range    BP Readings from Last 1 Encounters:  10/24/22 134/67         Passed - Valid encounter within last 6 months    Recent Outpatient Visits           3 weeks ago Type 2 diabetes mellitus with diabetic polyneuropathy, with long-term current use of insulin [facility])   [place] Karle Plumber B, MD   7 months ago Type 2 diabetes mellitus with diabetic polyneuropathy, with long-term current use of insulin [facility])   [place] Ladell Pier, MD   1 year ago Hospital discharge follow-up   [place],  Dalbert Batman, MD   1 year ago Type 2 diabetes mellitus with diabetic polyneuropathy, with long-term current use of insulin [facility])   [place] Karle Plumber B, MD   2 years ago Type 2 diabetes mellitus with diabetic polyneuropathy, with long-term current use of insulin [facility])   [place], MD       Future Appointments             In 1 month Minus Breeding, MD [place] at [facility]   In 3 months Wynetta Emery, Dalbert Batman, MD [place]

## 2022-12-17 DIAGNOSIS — E113511 Type 2 diabetes mellitus with proliferative diabetic retinopathy with macular edema, right eye: Secondary | ICD-10-CM | POA: Diagnosis not present

## 2022-12-29 DIAGNOSIS — H4052X3 Glaucoma secondary to other eye disorders, left eye, severe stage: Secondary | ICD-10-CM | POA: Diagnosis not present

## 2022-12-29 DIAGNOSIS — Z794 Long term (current) use of insulin: Secondary | ICD-10-CM | POA: Diagnosis not present

## 2022-12-29 DIAGNOSIS — E113553 Type 2 diabetes mellitus with stable proliferative diabetic retinopathy, bilateral: Secondary | ICD-10-CM | POA: Diagnosis not present

## 2022-12-29 DIAGNOSIS — H4089 Other specified glaucoma: Secondary | ICD-10-CM | POA: Diagnosis not present

## 2022-12-29 DIAGNOSIS — Z961 Presence of intraocular lens: Secondary | ICD-10-CM | POA: Diagnosis not present

## 2023-01-04 DIAGNOSIS — I5022 Chronic systolic (congestive) heart failure: Secondary | ICD-10-CM | POA: Insufficient documentation

## 2023-01-04 NOTE — Progress Notes (Unsigned)
Cardiology Office Note:   Date:  01/06/2023  ID:  AVNER MARCHI, DOB 1955/08/22, MRN [id number]  History of Present Illness:   Derek Lowery is a 67 y.o. male with a history of coronary artery disease.  I saw him because of an abnormal EKG. He has previously been seen by Dr. Riley Kill in 2013.   He has a history of CAD.   EF was 60 - 65% on echo in February.    In 2012 he had cardiac catheterization with 60% diagonal stenosis, distal LAD 60% stenosis, OM 2 with an occluded superior branch and inferior 90% branch stenosis.  He had DES stenting to the OM 2 inferior branch.  His EF was well-preserved and he was managed medically otherwise.  He was not seen again until 2020 when he was seen prior to getting gallbladder surgery.  Was seen in the hospital.  He had right bundle branch block.  He had a normal echocardiogram.  There were no other significant abnormalities at that time. He was in with respiratory failure in May 2022 and again in January 2023 he had acute respiratory failure and was ventilatory dependent with pneumonia and pulmonary edema.  His EF was 30 to 35% with global hypokinesis.  At the last visit an echo was ordered but he was not aware of this and this was not done.     Since he was last seen he had done well.  He gets around with a walker.  The patient denies any new symptoms such as chest discomfort, neck or arm discomfort. There has been no new shortness of breath, PND or orthopnea. There have been no reported palpitations, presyncope or syncope.   He has mild chronic leg swelling.   ROS: As stated in the HPI and negative for all other systems.  Studies Reviewed:    EKG:  NA   Risk Assessment/Calculations:       Physical Exam:   VS:  BP (!) 142/82   Pulse 64   Ht 5\' 10"  (1.778 m)   Wt 217 lb 6.4 oz (98.6 kg)   SpO2 96%   BMI 31.19 kg/m    Wt Readings from Last 3 Encounters:  01/06/23 217 lb 6.4 oz (98.6 kg)  10/24/22 214 lb (97.1 kg)  07/14/22 221 lb 9.6 oz (100.5  kg)     GEN: Well nourished, well developed in no acute distress NECK: No JVD; No carotid bruits CARDIAC: RRR, no murmurs, rubs, gallops RESPIRATORY:  Clear to auscultation without rales, wheezing or rhonchi  ABDOMEN: Soft, non-tender, non-distended EXTREMITIES:  Mild leg edema; No deformity   ASSESSMENT AND PLAN:      Bilateral lower extremity edema:     This is mild.  He has had his diuretic reduced secondary to increased creat.  No change in therapy.  I suggested compression stockings.   Coronary artery disease:    The patient has no new sypmtoms.  No further cardiovascular testing is indicated.  We will continue with aggressive risk reduction and meds as listed.   Essential hypertension:   Blood pressure is mildly elevated.  However, this is unusual.  No change in therapy.   Hyperlipidemia: LDL was 45 and this was down from 54.  No change in therapy.    Diabetes mellitus: A1c was 5.9 which is down from 6.2.  No change in therapy.    Cardiomyopathy his ejection fraction is reduced.   I am going to try to reschedule the echo.  He  has a very low-dose of ACE inhibitor prescribed by his nephrologist but otherwise is intolerant to admit titration because of his renal insufficiency.  His last creatinine was stable at 2.08 and he is followed by nephrology.  I will not titrate meds further.  He seems to be euvolemic.  His heart rate and blood pressure have been lower in the past and has not really tolerated med titration.        Signed, Rollene Rotunda, MD

## 2023-01-05 ENCOUNTER — Ambulatory Visit: Payer: 59 | Admitting: Cardiology

## 2023-01-06 ENCOUNTER — Encounter: Payer: Self-pay | Admitting: Cardiology

## 2023-01-06 ENCOUNTER — Ambulatory Visit: Payer: 59 | Attending: Cardiology | Admitting: Cardiology

## 2023-01-06 VITALS — BP 142/82 | HR 64 | Ht 70.0 in | Wt 217.4 lb

## 2023-01-06 DIAGNOSIS — I5022 Chronic systolic (congestive) heart failure: Secondary | ICD-10-CM

## 2023-01-06 DIAGNOSIS — I5032 Chronic diastolic (congestive) heart failure: Secondary | ICD-10-CM

## 2023-01-06 DIAGNOSIS — E785 Hyperlipidemia, unspecified: Secondary | ICD-10-CM | POA: Diagnosis not present

## 2023-01-06 DIAGNOSIS — I2510 Atherosclerotic heart disease of native coronary artery without angina pectoris: Secondary | ICD-10-CM

## 2023-01-06 DIAGNOSIS — E118 Type 2 diabetes mellitus with unspecified complications: Secondary | ICD-10-CM

## 2023-01-06 DIAGNOSIS — I10 Essential (primary) hypertension: Secondary | ICD-10-CM | POA: Diagnosis not present

## 2023-01-06 MED ORDER — TORSEMIDE 20 MG PO TABS: 20.0000 mg | ORAL_TABLET | Freq: Two times a day (BID) | ORAL | 0 refills | Status: DC

## 2023-01-06 NOTE — Patient Instructions (Signed)
Medication Instructions:  No Changes In Medications at this time.   *If you need a refill on your cardiac medications before your next appointment, please call your pharmacy*  Lab Work: None Ordered At This Time.   If you have labs (blood work) drawn today and your tests are completely normal, you will receive your results only by: MyChart Message (if you have MyChart) OR A paper copy in the mail If you have any lab test that is abnormal or we need to change your treatment, we will call you to review the results.  Testing/Procedures: Your physician has requested that you have an echocardiogram. Echocardiography is a painless test that uses sound waves to create images of your heart. It provides your doctor with information about the size and shape of your heart and how well your heart's chambers and valves are working. You may receive an ultrasound enhancing agent through an IV if needed to better visualize your heart during the echo.This procedure takes approximately one hour. There are no restrictions for this procedure. This will take place at the [address], [address].    Follow-Up: At [facility], you and your health needs are our priority.  As part of our continuing mission to provide you with exceptional heart care, we have created designated Provider Care Teams.  These Care Teams include your primary Cardiologist (physician) and Advanced Practice Providers (APPs -  Physician Assistants and Nurse Practitioners) who all work together to provide you with the care you need, when you need it.  Your next appointment:   6 month(s) WITH ANY APP   Provider:   ANY APP

## 2023-02-02 ENCOUNTER — Other Ambulatory Visit: Payer: Self-pay | Admitting: Internal Medicine

## 2023-02-02 DIAGNOSIS — E118 Type 2 diabetes mellitus with unspecified complications: Secondary | ICD-10-CM

## 2023-02-03 ENCOUNTER — Ambulatory Visit (HOSPITAL_COMMUNITY): Payer: 59

## 2023-02-03 ENCOUNTER — Encounter: Payer: Self-pay | Admitting: Gastroenterology

## 2023-02-03 ENCOUNTER — Ambulatory Visit: Payer: 59 | Admitting: Gastroenterology

## 2023-02-12 ENCOUNTER — Encounter (HOSPITAL_COMMUNITY): Payer: Self-pay | Admitting: Cardiology

## 2023-02-12 ENCOUNTER — Other Ambulatory Visit: Payer: Self-pay | Admitting: Internal Medicine

## 2023-02-12 ENCOUNTER — Ambulatory Visit (HOSPITAL_COMMUNITY): Payer: 59 | Attending: Cardiology

## 2023-02-12 DIAGNOSIS — F5101 Primary insomnia: Secondary | ICD-10-CM

## 2023-02-13 DIAGNOSIS — E113511 Type 2 diabetes mellitus with proliferative diabetic retinopathy with macular edema, right eye: Secondary | ICD-10-CM | POA: Diagnosis not present

## 2023-02-20 ENCOUNTER — Other Ambulatory Visit: Payer: Self-pay | Admitting: Internal Medicine

## 2023-02-20 DIAGNOSIS — I5032 Chronic diastolic (congestive) heart failure: Secondary | ICD-10-CM

## 2023-02-20 NOTE — Telephone Encounter (Signed)
Requested medication (s) are due for refill today: routing for review  Requested medication (s) are on the active medication list: yes  Last refill:  01/06/23  Future visit scheduled: yes  Notes to clinic:  Unable to refill per protocol, last refill by another provider. Routing for approval.     Requested Prescriptions  Pending Prescriptions Disp Refills   torsemide (DEMADEX) 20 MG tablet [Pharmacy Med Name: TORSEMIDE 20 MG TABLET] 270 tablet 0    Sig: TAKE 2 TABLETS (40MG ) BY MOUTH IN THE MORNING AND 1 TABLET (20MG ) BY MOUTH IN THE EVENING.     Cardiovascular:  Diuretics - Loop Failed - 02/20/2023  2:10 AM      Failed - K in normal range and within 180 days    Potassium  Date Value Ref Range Status  01/10/2022 3.9 3.5 - 5.2 mmol/L Final         Failed - Ca in normal range and within 180 days    Calcium  Date Value Ref Range Status  01/10/2022 9.1 8.6 - 10.2 mg/dL Final   Calcium, Ion  Date Value Ref Range Status  09/08/2021 1.08 (L) 1.15 - 1.40 mmol/L Final         Failed - Na in normal range and within 180 days    Sodium  Date Value Ref Range Status  01/10/2022 145 (H) 134 - 144 mmol/L Final         Failed - Cr in normal range and within 180 days    Creat  Date Value Ref Range Status  09/02/2016 0.80 0.70 - 1.33 mg/dL Final    Comment:      For patients > or = 65 years of age: The upper reference limit for Creatinine is approximately 13% higher for people identified as African-American.      Creatinine, Ser  Date Value Ref Range Status  01/10/2022 1.86 (H) 0.76 - 1.27 mg/dL Final   Creatinine, Urine  Date Value Ref Range Status  06/30/2020 97.16 mg/dL Final    Comment:    Performed at [facility], [address], [place], [place] [address]         Failed - Cl in normal range and within 180 days    Chloride  Date Value Ref Range Status  01/10/2022 103 96 - 106 mmol/L Final         Failed - Mg Level in normal range and within 180 days     Magnesium  Date Value Ref Range Status  09/15/2021 2.2 1.7 - 2.4 mg/dL Final    Comment:    Performed at [facility], [address], [place], [place] [address]         Failed - Last BP in normal range    BP Readings from Last 1 Encounters:  01/06/23 (!) 142/82         Passed - Valid encounter within last 6 months    Recent Outpatient Visits           3 months ago Type 2 diabetes mellitus with diabetic polyneuropathy, with long-term current use of insulin [facility])   [place] [facility] Jonah Blue B, MD   11 months ago Type 2 diabetes mellitus with diabetic polyneuropathy, with long-term current use of insulin [facility])   [place] [facility] Marcine Matar, MD   1 year ago Hospital discharge follow-up   [facility] [employer],  Binnie Rail, MD   1 year ago Type 2 diabetes mellitus with diabetic polyneuropathy, with long-term current use of insulin [facility])   [place] [facility] Jonah Blue B, MD   2 years ago Type 2 diabetes mellitus with diabetic polyneuropathy, with long-term current use of insulin [facility])   [place] [facility] Marcine Matar, MD       Future Appointments             In 3 days Marcine Matar, MD [facility]

## 2023-02-23 ENCOUNTER — Ambulatory Visit: Payer: Self-pay | Admitting: Internal Medicine

## 2023-03-02 ENCOUNTER — Other Ambulatory Visit: Payer: Self-pay | Admitting: Internal Medicine

## 2023-03-02 DIAGNOSIS — E118 Type 2 diabetes mellitus with unspecified complications: Secondary | ICD-10-CM

## 2023-03-02 NOTE — Telephone Encounter (Signed)
Requested Prescriptions  Pending Prescriptions Disp Refills   Insulin Pen Needle (BD PEN NEEDLE NANO 2ND GEN) 32G X 4 MM MISC [Pharmacy Med Name: BD NANO 2 GEN PEN NDL 32G 4MM] 100 each 1    Sig: USE AS DIRECTED 3 TIMES A DAY     Endocrinology: Diabetes - Testing Supplies Passed - 03/02/2023  2:37 AM      Passed - Valid encounter within last 12 months    Recent Outpatient Visits           4 months ago Type 2 diabetes mellitus with diabetic polyneuropathy, with long-term current use of insulin (HCC)   [place] [facility] Jon[facility] Blue B, MD   11 months ago Type 2 diabetes mellitus with diabetic polyneuropathy, with long-term current use of insulin [facility])   [place] [facility] Marcine Matar, MD   1 year ago Hospital discharge follow-up   [facility] Jonah Blue B, MD   1 year ago Type 2 diabetes mellitus with diabetic polyneuropathy, with long-term current use of insulin [facility])   [place] [facility] Jonah Blue B, MD   2 years ago Type 2 diabetes mellitus with diabetic polyneuropathy, with long-term current use of insulin [facility])    [facility] Marcine Matar, MD

## 2023-03-04 ENCOUNTER — Other Ambulatory Visit: Payer: Self-pay | Admitting: Internal Medicine

## 2023-03-04 DIAGNOSIS — I5032 Chronic diastolic (congestive) heart failure: Secondary | ICD-10-CM

## 2023-03-28 ENCOUNTER — Other Ambulatory Visit: Payer: Self-pay | Admitting: Internal Medicine

## 2023-03-28 DIAGNOSIS — I5032 Chronic diastolic (congestive) heart failure: Secondary | ICD-10-CM

## 2023-03-30 NOTE — Telephone Encounter (Signed)
Requested medication (s) are due for refill today: yes  Requested medication (s) are on the active medication list: yes  Last refill:  01/06/23  Future visit scheduled: no  Notes to clinic:  Unable to refill per protocol, last refill by another provider. Routing to PCP for approval.     Requested Prescriptions  Pending Prescriptions Disp Refills   torsemide (DEMADEX) 20 MG tablet [Pharmacy Med Name: TORSEMIDE 20 MG TABLET] 270 tablet 0    Sig: TAKE 2 TABLETS (40MG ) BY MOUTH IN THE MORNING AND 1 TABLET (20MG ) BY MOUTH IN THE EVENING.     Cardiovascular:  Diuretics - Loop Failed - 03/28/2023  1:19 AM      Failed - K in normal range and within 180 days    Potassium  Date Value Ref Range Status  01/10/2022 3.9 3.5 - 5.2 mmol/L Final         Failed - Ca in normal range and within 180 days    Calcium  Date Value Ref Range Status  01/10/2022 9.1 8.6 - 10.2 mg/dL Final   Calcium, Ion  Date Value Ref Range Status  09/08/2021 1.08 (L) 1.15 - 1.40 mmol/L Final         Failed - Na in normal range and within 180 days    Sodium  Date Value Ref Range Status  01/10/2022 145 (H) 134 - 144 mmol/L Final         Failed - Cr in normal range and within 180 days    Creat  Date Value Ref Range Status  09/02/2016 0.80 0.70 - 1.33 mg/dL Final    Comment:      For patients > or = 65 years of age: The upper reference limit for Creatinine is approximately 13% higher for people identified as African-American.      Creatinine, Ser  Date Value Ref Range Status  01/10/2022 1.86 (H) 0.76 - 1.27 mg/dL Final   Creatinine, Urine  Date Value Ref Range Status  06/30/2020 97.16 mg/dL Final    Comment:    Performed at [facility], [address] [place], [place] [address]         Failed - Cl in normal range and within 180 days    Chloride  Date Value Ref Range Status  01/10/2022 103 96 - 106 mmol/L Final         Failed - Mg Level in normal range and within 180 days    Magnesium   Date Value Ref Range Status  09/15/2021 2.2 1.7 - 2.4 mg/dL Final    Comment:    Performed at [facility], [address], [place], [place] [address]         Failed - Last BP in normal range    BP Readings from Last 1 Encounters:  01/06/23 (!) 142/82         Passed - Valid encounter within last 6 months    Recent Outpatient Visits           5 months ago Type 2 diabetes mellitus with diabetic polyneuropathy, with long-term current use of insulin (HCC)   [place] [facility] Jonah Blue B, MD   1 year ago Type 2 diabetes mellitus with diabetic polyneuropathy, with long-term current use of insulin [facility])   [place] [facility] Marcine Matar, MD   1 year ago Hospital discharge follow-up   [facility] & [facility],  Binnie Rail, MD   1 year ago Type 2 diabetes mellitus with diabetic polyneuropathy, with long-term current use of insulin [facility])   [place] [facility] Jonah Blue B, MD   2 years ago Type 2 diabetes mellitus with diabetic polyneuropathy, with long-term current use of insulin [facility])   [place] [facility] Marcine Matar, MD

## 2023-04-06 DIAGNOSIS — E113511 Type 2 diabetes mellitus with proliferative diabetic retinopathy with macular edema, right eye: Secondary | ICD-10-CM | POA: Diagnosis not present

## 2023-04-19 ENCOUNTER — Other Ambulatory Visit: Payer: Self-pay | Admitting: Internal Medicine

## 2023-04-19 DIAGNOSIS — E785 Hyperlipidemia, unspecified: Secondary | ICD-10-CM

## 2023-04-19 DIAGNOSIS — E1159 Type 2 diabetes mellitus with other circulatory complications: Secondary | ICD-10-CM

## 2023-04-19 DIAGNOSIS — I25118 Atherosclerotic heart disease of native coronary artery with other forms of angina pectoris: Secondary | ICD-10-CM

## 2023-04-21 ENCOUNTER — Ambulatory Visit (HOSPITAL_COMMUNITY): Payer: 59 | Attending: Cardiology

## 2023-04-21 NOTE — Telephone Encounter (Signed)
Requested Prescriptions  Pending Prescriptions Disp Refills   isosorbide mononitrate (IMDUR) 120 MG 24 hr tablet [Pharmacy Med Name: ISOSORBIDE MONONIT ER 120 MG] 90 tablet 0    Sig: TAKE 1 TABLET BY MOUTH EVERY DAY     Cardiovascular:  Nitrates Failed - 04/19/2023  1:19 AM      Failed - Last BP in normal range    BP Readings from Last 1 Encounters:  01/06/23 (!) 142/82         Passed - Last Heart Rate in normal range    Pulse Readings from Last 1 Encounters:  01/06/23 64         Passed - Valid encounter within last 12 months    Recent Outpatient Visits           5 months ago Type 2 diabetes mellitus with diabetic polyneuropathy, with long-term current use of insulin (HCC)   [place] [facility] Jonah Blue B, MD   1 year ago Type 2 diabetes mellitus with diabetic polyneuropathy, with long-term current use of insulin (HCC)   [place] [facility] Marcine Matar, MD   1 year ago Hospital discharge follow-up   [place] [facility] Jonah Blue B, MD   1 year ago Type 2 diabetes mellitus with diabetic polyneuropathy, with long-term current use of insulin (HCC)   [place] [facility] Jonah Blue B, MD   2 years ago Type 2 diabetes mellitus with diabetic polyneuropathy, with long-term current use of insulin (HCC)   [place] [facility] Marcine Matar, MD       Future Appointments             In 1 month Marcine Matar, MD [facility]             atorvastatin (LIPITOR) 40 MG tablet [Pharmacy Med Name: ATORVASTATIN 40 MG TABLET] 90 tablet 1    Sig: TAKE 1 TABLET BY MOUTH EVERY DAY     Cardiovascular:  Antilipid - Statins Failed - 04/19/2023  1:19 AM      Failed - Lipid Panel in normal range within the last 12 months    Cholesterol, Total  Date Value Ref Range Status  10/24/2022 98 (L) 100 -  199 mg/dL Final   LDL Chol Calc (NIH)  Date Value Ref Range Status  10/24/2022 45 0 - 99 mg/dL Final   HDL  Date Value Ref Range Status  10/24/2022 35 (L) >39 mg/dL Final   Triglycerides  Date Value Ref Range Status  10/24/2022 91 0 - 149 mg/dL Final         Passed - Patient is not pregnant      Passed - Valid encounter within last 12 months    Recent Outpatient Visits           5 months ago Type 2 diabetes mellitus with diabetic polyneuropathy, with long-term current use of insulin (HCC)   [place] [facility] Jonah Blue B, MD   1 year ago Type 2 diabetes mellitus with diabetic polyneuropathy, with long-term current use of insulin [facility])   [place] [facility] Marcine Matar, MD   1 year ago Hospital discharge follow-up   [facility] Jonah Blue B, MD   1 year ago Type 2 diabetes mellitus with diabetic polyneuropathy,  with long-term current use of insulin [facility])   [place] [facility] Jonah Blue B, MD   2 years ago Type 2 diabetes mellitus with diabetic polyneuropathy, with long-term current use of insulin [facility])   [place] [facility] Marcine Matar, MD       Future Appointments             In 1 month Laural Benes, Binnie Rail, MD [facility]

## 2023-04-22 ENCOUNTER — Encounter (HOSPITAL_COMMUNITY): Payer: Self-pay | Admitting: Cardiology

## 2023-04-23 ENCOUNTER — Other Ambulatory Visit: Payer: Self-pay | Admitting: Internal Medicine

## 2023-04-23 DIAGNOSIS — I5032 Chronic diastolic (congestive) heart failure: Secondary | ICD-10-CM

## 2023-04-24 DIAGNOSIS — D649 Anemia, unspecified: Secondary | ICD-10-CM | POA: Diagnosis not present

## 2023-04-24 DIAGNOSIS — E1122 Type 2 diabetes mellitus with diabetic chronic kidney disease: Secondary | ICD-10-CM | POA: Diagnosis not present

## 2023-04-24 DIAGNOSIS — E785 Hyperlipidemia, unspecified: Secondary | ICD-10-CM | POA: Diagnosis not present

## 2023-04-24 DIAGNOSIS — R809 Proteinuria, unspecified: Secondary | ICD-10-CM | POA: Diagnosis not present

## 2023-04-24 DIAGNOSIS — N1832 Chronic kidney disease, stage 3b: Secondary | ICD-10-CM | POA: Diagnosis not present

## 2023-04-24 DIAGNOSIS — I5020 Unspecified systolic (congestive) heart failure: Secondary | ICD-10-CM | POA: Diagnosis not present

## 2023-04-24 DIAGNOSIS — I129 Hypertensive chronic kidney disease with stage 1 through stage 4 chronic kidney disease, or unspecified chronic kidney disease: Secondary | ICD-10-CM | POA: Diagnosis not present

## 2023-04-26 ENCOUNTER — Other Ambulatory Visit: Payer: Self-pay | Admitting: Internal Medicine

## 2023-05-01 ENCOUNTER — Other Ambulatory Visit: Payer: Self-pay | Admitting: Internal Medicine

## 2023-05-10 ENCOUNTER — Other Ambulatory Visit: Payer: Self-pay | Admitting: Internal Medicine

## 2023-05-10 DIAGNOSIS — F5101 Primary insomnia: Secondary | ICD-10-CM

## 2023-05-11 DIAGNOSIS — E113511 Type 2 diabetes mellitus with proliferative diabetic retinopathy with macular edema, right eye: Secondary | ICD-10-CM | POA: Diagnosis not present

## 2023-05-16 ENCOUNTER — Other Ambulatory Visit: Payer: Self-pay | Admitting: Internal Medicine

## 2023-05-16 DIAGNOSIS — I5032 Chronic diastolic (congestive) heart failure: Secondary | ICD-10-CM

## 2023-05-18 NOTE — Telephone Encounter (Signed)
Request is too soon for refill, last refill 04/23/23.  Requested Prescriptions  Pending Prescriptions Disp Refills   torsemide (DEMADEX) 20 MG tablet [Pharmacy Med Name: TORSEMIDE 20 MG TABLET] 270 tablet 1    Sig: TAKE 2 TABLETS (40MG ) BY MOUTH IN THE MORNING AND 1 TABLET (20MG ) BY MOUTH IN THE EVENING KEEP APPT     Cardiovascular:  Diuretics - Loop Failed - 05/16/2023  2:27 PM      Failed - K in normal range and within 180 days    Potassium  Date Value Ref Range Status  01/10/2022 3.9 3.5 - 5.2 mmol/L Final         Failed - Ca in normal range and within 180 days    Calcium  Date Value Ref Range Status  01/10/2022 9.1 8.6 - 10.2 mg/dL Final   Calcium, Ion  Date Value Ref Range Status  09/08/2021 1.08 (L) 1.15 - 1.40 mmol/L Final         Failed - Na in normal range and within 180 days    Sodium  Date Value Ref Range Status  01/10/2022 145 (H) 134 - 144 mmol/L Final         Failed - Cr in normal range and within 180 days    Creat  Date Value Ref Range Status  09/02/2016 0.80 0.70 - 1.33 mg/dL Final    Comment:      For patients > or = 65 years of age: The upper reference limit for Creatinine is approximately 13% higher for people identified as African-American.      Creatinine, Ser  Date Value Ref Range Status  01/10/2022 1.86 (H) 0.76 - 1.27 mg/dL Final   Creatinine, Urine  Date Value Ref Range Status  06/30/2020 97.16 mg/dL Final    Comment:    Performed at [facility], [address], [place], [place] [address]         Failed - Cl in normal range and within 180 days    Chloride  Date Value Ref Range Status  01/10/2022 103 96 - 106 mmol/L Final         Failed - Mg Level in normal range and within 180 days    Magnesium  Date Value Ref Range Status  09/15/2021 2.2 1.7 - 2.4 mg/dL Final    Comment:    Performed at [facility], [address], [place], [place] [address]         Failed - Last BP in normal range    BP Readings from Last 1  Encounters:  01/06/23 (!) 142/82         Failed - Valid encounter within last 6 months    Recent Outpatient Visits           6 months ago Type 2 diabetes mellitus with diabetic polyneuropathy, with long-term current use of insulin (HCC)   [place] [facility] Jonah Blue B, MD   1 year ago Type 2 diabetes mellitus with diabetic polyneuropathy, with long-term current use of insulin [facility])   [place] [facility] Marcine Matar, MD   1 year ago Hospital discharge follow-up   [facility] Jonah Blue B, MD   1 year ago Type 2 diabetes mellitus with diabetic polyneuropathy, with long-term current use of insulin [facility])   [place] [facility] Jonah Blue B, MD   2 years ago Type 2  diabetes mellitus with diabetic polyneuropathy, with long-term current use of insulin [facility])   [place] [facility] Marcine Matar, MD       Future Appointments             In 4 weeks Laural Benes Binnie Rail, MD [facility]

## 2023-05-26 ENCOUNTER — Ambulatory Visit: Payer: 59 | Admitting: Gastroenterology

## 2023-05-26 NOTE — Progress Notes (Deleted)
Gastroenterology Consultation  Referring Provider:     Marcine Matar, MD Primary Care Physician:  Marcine Matar, MD Primary Gastroenterologist:  Dr. Servando Snare     Reason for Consultation:     Diarrhea        HPI:   STRIDER VALLANCE is a 67 y.o. y/o male referred for consultation & management of diarrhea by Dr. Laural Benes, Binnie Rail, MD. This patient comes in today after having a colonoscopy by Dr. Leone Payor in [place] back in 2018 with a normal colonoscopy at that time and a recommendation to have a repeat colonoscopy in 10 years.  The patient had seen his primary care provider back in March with the report of chronic diarrhea for over a year.  He was reporting that he had a bowel movement approximately twice a week but had 2-3 bowel movements that were very loose when he did have his bowel movements.  At that time he denied any blood in his stool and was taking Imodium when he had the loose stools.  He also reported some bloating with the diarrhea.  Past Medical History:  Diagnosis Date   Arthritis    CAD (coronary artery disease)    NSTEMI 06/2011:  LHC 07/18/11: Proximal diagonal 60%, distal LAD with a diabetic appearance and 60% stenosis, OM2 with an occluded superior branch and an inferior branch with 90%, EF 55% with inferior hypokinesis.  PCI: Promus DES to the OM2 inferior branch.  This vessel provides collaterals to the superior branch which remained occluded.  Echocardiogram 07/18/11: EF 60%, normal wall motion.   Cataract    Essential hypertension, benign    Glaucoma    Hypercholesteremia    Internal hemorrhoids    Noncompliance    Type 2 diabetes mellitus (HCC) 1990    Past Surgical History:  Procedure Laterality Date   CATARACT EXTRACTION Right 2019   Dr. Dione Booze   CHOLECYSTECTOMY N/A 09/21/2017   Procedure: LAPAROSCOPIC CHOLECYSTECTOMY WITH INTRAOPERATIVE CHOLANGIOGRAM;  Surgeon: Karie Soda, MD;  Location: [facility];  Service: General;  Laterality: N/A;   COLONOSCOPY   2000   Dr. Loreta Ave   EYE SURGERY     IRRIGATION AND DEBRIDEMENT ABSCESS Left 05/05/2014   Procedure: IRRIGATION AND DEBRIDEMENT ABSCESS left buttock;  Surgeon: Karie Soda, MD;  Location: [facility];  Service: General;  Laterality: Left;   LEFT HEART CATHETERIZATION WITH CORONARY ANGIOGRAM N/A 07/18/2011   Procedure: LEFT HEART CATHETERIZATION WITH CORONARY ANGIOGRAM;  Surgeon: Herby Abraham, MD;  Location: [facility] CATH LAB;  Service: Cardiovascular;  Laterality: N/A;   PERCUTANEOUS CORONARY STENT INTERVENTION (PCI-S)  07/18/2011   Procedure: PERCUTANEOUS CORONARY STENT INTERVENTION (PCI-S);  Surgeon: Herby Abraham, MD;  Location: [facility] CATH LAB;  Service: Cardiovascular;;    Prior to Admission medications   Medication Sig Start Date End Date Taking? Authorizing Provider  acetaminophen (TYLENOL) 325 MG tablet Take 2 tablets (650 mg total) by mouth every 6 (six) hours as needed for mild pain (or Fever >/= 101). 09/16/21   Arrien, York Ram, MD  ammonium lactate (AMLACTIN) 12 % lotion Apply to both feet twice daily for dry skin. 11/16/20   Marcine Matar, MD  aspirin EC 81 MG tablet Take 1 tablet (81 mg total) by mouth daily. 07/12/19   Anders Simmonds, PA-C  atorvastatin (LIPITOR) 40 MG tablet TAKE 1 TABLET BY MOUTH EVERY DAY 04/21/23   Marcine Matar, MD  brimonidine (ALPHAGAN) 0.2 % ophthalmic solution INSTILL 1 DROP INTO RIGHT EYE  TWICE A DAY 11/03/22   Marcine Matar, MD  carvedilol (COREG) 12.5 MG tablet TAKE 1 TABLET (12.5MG  TOTAL) BY MOUTH TWICE A DAY WITH MEALS 05/01/23   Marcine Matar, MD  Continuous Blood Gluc Receiver (DEXCOM G6 RECEIVER) DEVI Use to check blood sugar four times daily. E11.42 09/17/21   Marcine Matar, MD  Continuous Blood Gluc Sensor (DEXCOM G6 SENSOR) MISC Use to check blood sugar four times daily. E11.42 10/24/22   Marcine Matar, MD  Continuous Blood Gluc Transmit (DEXCOM G6 TRANSMITTER) MISC Use to check blood sugar four times daily. E11.42 10/24/22    Marcine Matar, MD  dorzolamide (TRUSOPT) 2 % ophthalmic solution Place 1 drop into the right eye 2 (two) times daily.    [provider]  hydrALAZINE (APRESOLINE) 100 MG tablet TAKE 1 TABLET BY MOUTH THREE TIMES A DAY 04/27/23   Marcine Matar, MD  insulin glargine (LANTUS) 100 UNIT/ML injection Inject 10 Units into the skin at bedtime.    [provider]  insulin lispro (HUMALOG KWIKPEN) 100 UNIT/ML KwikPen INJECT 10 UNITS INTO THE SKIN 3 (THREE) TIMES DAILY WITH MEALS. 05/06/22   Marcine Matar, MD  Insulin Pen Needle (BD PEN NEEDLE NANO 2ND GEN) 32G X 4 MM MISC USE AS DIRECTED 3 TIMES A DAY 03/02/23   Marcine Matar, MD  isosorbide mononitrate (IMDUR) 120 MG 24 hr tablet TAKE 1 TABLET BY MOUTH EVERY DAY 04/21/23   Marcine Matar, MD  KERENDIA 10 MG TABS Take 1 tablet by mouth daily. 09/29/22   [provider]  Lancets MISC Use as directed.  Accu chek 2 12/21/17   Marcine Matar, MD  latanoprost (XALATAN) 0.005 % ophthalmic solution Place 1 drop into the right eye at bedtime. 12/13/20   [provider]  lisinopril (ZESTRIL) 20 MG tablet Take 20 mg by mouth daily. 06/13/22   [provider]  Multiple Vitamins-Minerals (MULTIVITAMIN WITH MINERALS) tablet Take 1 tablet by mouth daily.    [provider]  RHOPRESSA 0.02 % SOLN Place 1 drop into the right eye at bedtime. 01/24/20   [provider]  timolol (TIMOPTIC) 0.5 % ophthalmic solution Place 1 drop into the right eye 2 (two) times daily. 07/09/21   [provider]  torsemide (DEMADEX) 20 MG tablet TAKE 2 TABLETS (40MG ) BY MOUTH IN THE MORNING AND 1 TABLET (20MG ) BY MOUTH IN THE EVENING KEEP APPT 04/23/23   Marcine Matar, MD  traZODone (DESYREL) 50 MG tablet TAKE 1/2 TABLET BY MOUTH EVERY DAY AT BEDTIME 05/11/23   Marcine Matar, MD    Family History  Problem Relation Age of Onset   Coronary artery disease Father        Developed in his 59s   Kidney  disease Father    Diabetes Father    Melanoma Father    Rectal cancer Father    Heart failure Mother    Hypertension Mother    Diabetes Sister    Diabetes Brother    Colon cancer Neg Hx    Esophageal cancer Neg Hx    Stomach cancer Neg Hx      Social History   Tobacco Use   Smoking status: Never   Smokeless tobacco: Never  Vaping Use   Vaping status: Never Used  Substance Use Topics   Alcohol use: No   Drug use: No    Allergies as of 05/26/2023 - Review Complete 01/06/2023  Allergen Reaction Noted  Empagliflozin Other (See Comments) 09/27/2021    Review of Systems:    All systems reviewed and negative except where noted in HPI.   Physical Exam:  There were no vitals taken for this visit. No LMP for male patient. General:   Alert,  Well-developed, well-nourished, pleasant and cooperative in NAD Head:  Normocephalic and atraumatic. Eyes:  Sclera clear, no icterus.   Conjunctiva pink. Ears:  Normal auditory acuity. Neck:  Supple; no masses or thyromegaly. Lungs:  Respirations even and unlabored.  Clear throughout to auscultation.   No wheezes, crackles, or rhonchi. No acute distress. Heart:  Regular rate and rhythm; no murmurs, clicks, rubs, or gallops. Abdomen:  Normal bowel sounds.  No bruits.  Soft, non-tender and non-distended without masses, hepatosplenomegaly or hernias noted.  No guarding or rebound tenderness.  Negative Carnett sign.   Rectal:  Deferred.  Pulses:  Normal pulses noted. Extremities:  No clubbing or edema.  No cyanosis. Neurologic:  Alert and oriented x3;  grossly normal neurologically. Skin:  Intact without significant lesions or rashes.  No jaundice. Lymph Nodes:  No significant cervical adenopathy. Psych:  Alert and cooperative. Normal mood and affect.  Imaging Studies: No results found.  Assessment and Plan:   THEON SOBOTKA is a 66 y.o. y/o male ***    Midge Minium, MD. Clementeen Graham    Note: This dictation was prepared with Dragon  dictation along with smaller phrase technology. Any transcriptional errors that result from this process are unintentional.

## 2023-06-01 DIAGNOSIS — M2042 Other hammer toe(s) (acquired), left foot: Secondary | ICD-10-CM | POA: Diagnosis not present

## 2023-06-01 DIAGNOSIS — M2041 Other hammer toe(s) (acquired), right foot: Secondary | ICD-10-CM | POA: Diagnosis not present

## 2023-06-01 DIAGNOSIS — L851 Acquired keratosis [keratoderma] palmaris et plantaris: Secondary | ICD-10-CM | POA: Diagnosis not present

## 2023-06-01 DIAGNOSIS — L84 Corns and callosities: Secondary | ICD-10-CM | POA: Diagnosis not present

## 2023-06-01 DIAGNOSIS — E1142 Type 2 diabetes mellitus with diabetic polyneuropathy: Secondary | ICD-10-CM | POA: Diagnosis not present

## 2023-06-01 DIAGNOSIS — M216X2 Other acquired deformities of left foot: Secondary | ICD-10-CM | POA: Diagnosis not present

## 2023-06-01 DIAGNOSIS — L97512 Non-pressure chronic ulcer of other part of right foot with fat layer exposed: Secondary | ICD-10-CM | POA: Diagnosis not present

## 2023-06-01 DIAGNOSIS — B351 Tinea unguium: Secondary | ICD-10-CM | POA: Diagnosis not present

## 2023-06-01 DIAGNOSIS — L97511 Non-pressure chronic ulcer of other part of right foot limited to breakdown of skin: Secondary | ICD-10-CM | POA: Diagnosis not present

## 2023-06-01 DIAGNOSIS — M216X1 Other acquired deformities of right foot: Secondary | ICD-10-CM | POA: Diagnosis not present

## 2023-06-01 DIAGNOSIS — M79675 Pain in left toe(s): Secondary | ICD-10-CM | POA: Diagnosis not present

## 2023-06-01 DIAGNOSIS — M79674 Pain in right toe(s): Secondary | ICD-10-CM | POA: Diagnosis not present

## 2023-06-02 DIAGNOSIS — E1142 Type 2 diabetes mellitus with diabetic polyneuropathy: Secondary | ICD-10-CM | POA: Diagnosis not present

## 2023-06-14 ENCOUNTER — Other Ambulatory Visit: Payer: Self-pay | Admitting: Internal Medicine

## 2023-06-14 DIAGNOSIS — E118 Type 2 diabetes mellitus with unspecified complications: Secondary | ICD-10-CM

## 2023-06-15 DIAGNOSIS — E1142 Type 2 diabetes mellitus with diabetic polyneuropathy: Secondary | ICD-10-CM | POA: Diagnosis not present

## 2023-06-15 DIAGNOSIS — L851 Acquired keratosis [keratoderma] palmaris et plantaris: Secondary | ICD-10-CM | POA: Diagnosis not present

## 2023-06-16 ENCOUNTER — Ambulatory Visit: Payer: 59 | Attending: Internal Medicine | Admitting: Internal Medicine

## 2023-06-16 ENCOUNTER — Telehealth: Payer: Self-pay | Admitting: Internal Medicine

## 2023-06-16 ENCOUNTER — Encounter: Payer: Self-pay | Admitting: Internal Medicine

## 2023-06-16 VITALS — BP 171/72 | HR 50 | Temp 97.9°F | Ht 70.0 in | Wt 215.0 lb

## 2023-06-16 DIAGNOSIS — K5900 Constipation, unspecified: Secondary | ICD-10-CM | POA: Diagnosis not present

## 2023-06-16 DIAGNOSIS — E1159 Type 2 diabetes mellitus with other circulatory complications: Secondary | ICD-10-CM | POA: Diagnosis not present

## 2023-06-16 DIAGNOSIS — I25118 Atherosclerotic heart disease of native coronary artery with other forms of angina pectoris: Secondary | ICD-10-CM | POA: Diagnosis not present

## 2023-06-16 DIAGNOSIS — I5022 Chronic systolic (congestive) heart failure: Secondary | ICD-10-CM | POA: Diagnosis not present

## 2023-06-16 DIAGNOSIS — Z23 Encounter for immunization: Secondary | ICD-10-CM | POA: Diagnosis not present

## 2023-06-16 DIAGNOSIS — E1122 Type 2 diabetes mellitus with diabetic chronic kidney disease: Secondary | ICD-10-CM

## 2023-06-16 DIAGNOSIS — N184 Chronic kidney disease, stage 4 (severe): Secondary | ICD-10-CM | POA: Diagnosis not present

## 2023-06-16 DIAGNOSIS — D631 Anemia in chronic kidney disease: Secondary | ICD-10-CM | POA: Diagnosis not present

## 2023-06-16 DIAGNOSIS — I152 Hypertension secondary to endocrine disorders: Secondary | ICD-10-CM | POA: Diagnosis not present

## 2023-06-16 DIAGNOSIS — R110 Nausea: Secondary | ICD-10-CM

## 2023-06-16 DIAGNOSIS — Z794 Long term (current) use of insulin: Secondary | ICD-10-CM

## 2023-06-16 DIAGNOSIS — R6889 Other general symptoms and signs: Secondary | ICD-10-CM

## 2023-06-16 DIAGNOSIS — E1142 Type 2 diabetes mellitus with diabetic polyneuropathy: Secondary | ICD-10-CM | POA: Diagnosis not present

## 2023-06-16 LAB — POCT GLYCOSYLATED HEMOGLOBIN (HGB A1C): HbA1c, POC (controlled diabetic range): 6.1 % (ref 0.0–7.0)

## 2023-06-16 LAB — GLUCOSE, POCT (MANUAL RESULT ENTRY): POC Glucose: 119 mg/dL — AB (ref 70–99)

## 2023-06-16 MED ORDER — ONDANSETRON HCL 4 MG PO TABS: 4.0000 mg | ORAL_TABLET | Freq: Every day | ORAL | 1 refills | Status: DC | PRN

## 2023-06-16 NOTE — Patient Instructions (Addendum)
Your blood pressure is uncontrolled.  You should be on lisinopril 20 mg daily.  Please confirm when you get home and call us back to let us know.  If you are already on the 20 mg daily, then I would recommend increasing the torsemide to 20 mg 3 times a day.  Try to limit salt in the foods as salt adversely affects the blood pressure.  I have sent a prescription to your pharmacy for Zofran to use as needed for the nausea.  It is okay for you to use the MiraLAX that you have at home as needed for constipation.  Increasing green leafy vegetables in the diet will also help to decrease constipation.  Decreased Lantus insulin to 9 units at bedtime.  Placed in [facility]  [address]   [place], [place] [address] PH# [phone]

## 2023-06-16 NOTE — Progress Notes (Unsigned)
Patient ID: Derek Lowery, male    DOB: October 05, 1955  MRN: [id number]  CC: Diabetes (DM f/u. Derek Lowery experiencing nausea and abdominal pain/Experiencing nightmares /Flu vax administered on 06/16/23 - C.A)   Subjective: Derek Lowery is a 67 y.o. male who presents for chronic ds management. His concerns today include:  hx of DM type 2 with retinopathy(laser treatments by Dr. Luciana Lowery), peripheral neuropathy, and microalbuminuria, HTN, CAD with stent OM2 in 2012 (NSTEMI 08/2021, diastolic CHF (EF 65-78% 09/2021), glaucoma (blind LT eye) and HL.  C/o chronic diarrhea on last visit in 10/2022.  Referred to GI but did not go.  Stopped eating a lot of bread and this seemed to have decreased the episodes Since then he has been having more constipation; not taking anything for it but does have Miralax at home. Also has a lot of nausea intermittently x few mths; later said it started happening after his GB was removed yrs ago.  Occurs in b/w meals about 3x/wk; last 30-60 mins.  Denies any abdominal pain with the nausea.  Has dreams about dead relatives all the time; this has been happening for a few yrs. Not scary dreams but wonders if any of his meds could be causing it.  On Trazodone but does not take consistently.  Last took it about 3 mths ago.    DM: Results for orders placed or performed in visit on 06/16/23  POCT glycosylated hemoglobin (Hb A1C)  Result Value Ref Range   Hemoglobin A1C     HbA1c POC (<> result, manual entry)     HbA1c, POC (prediabetic range)     HbA1c, POC (controlled diabetic range) 6.1 0.0 - 7.0 %  POCT glucose (manual entry)  Result Value Ref Range   POC Glucose 119 (A) 70 - 99 mg/dl  He is taking glargine insulin 10 units daily.  Uses Humalog insulin about once a day.  Eating more candy recently.  Endorses low BS episodes more recently, mainly early mornings around 2 a.m. Has Dexcom 6 and has reader with him.  However, I am only able to see data for the last 24 hr.   Reports BS BF breakfast in the 100-105 range   HTN/CAD/CHF: Last saw his cardiologist Dr. Antoine Lowery 12/2022.  Echo was ordered.  He has not had this done. Reports compliance with taking his medications including carvedilol 12.5 mg twice a day, Lisinopril 10 mg (but should be on 20 mg according to last nephrology note and med list), Lipitor 40 mg daily, hydralazine 100 mg 3 times a day, Imdur 120 mg, torsemide 20 mg in the morning and 20 mg in the evening. Insurance did not pay for Kerendia -No CP, SOB, no PND, palpitations.  +LE edema that go down at nights while in bed Does not check BP at home.  Thinks BP high today because he uses salt on eggs this morning.  BP on visit with nephrology 04/24/23 was 118/66  CKD4/ACD: last saw Dr. Glenna Lowery 04/2023.  GFR was 26. Creat 2.63. Has ACD.  Hb was 10.6 Not on Iron supplement.  No blood in stools. A little light headed at times.   Patient Active Problem List   Diagnosis Date Noted   Chronic systolic HF (heart failure) (HCC) 01/04/2023   Leg swelling 01/09/2022   Coronary artery disease involving native coronary artery of native heart without angina pectoris 01/09/2022   Multifocal pneumonia 09/07/2021   Acute systolic CHF (congestive heart failure) (HCC) 09/07/2021   Chronic diastolic CHF (  congestive heart failure) (HCC) 06/17/2021   Neovascular glaucoma, right eye, severe stage 06/13/2021   Acute exacerbation of CHF (congestive heart failure) (HCC) 12/24/2020   Pneumatouria 06/28/2020   Pneumonia due to COVID-19 virus 06/28/2020   Proliferative diabetic retinopathy of right eye with macular edema associated with type 2 diabetes mellitus (HCC) 11/30/2019   Rubeosis iridis of right eye 11/30/2019   Glaucoma with pupillary seclusion, unspecified laterality, severe stage 11/30/2019   Trigger middle finger of right hand 10/17/2019   Pain due to onychomycosis of toenails of both feet 08/07/2019   Educated about COVID-19 virus infection 12/02/2018   RBBB  12/02/2018   Benign paroxysmal positional vertigo 06/25/2018   Pre-ulcerative corn or callous 10/13/2017   Anemia 10/13/2017   Acute kidney injury superimposed on CKD (HCC)    Chronic diarrhea 06/09/2017   Microalbuminuria 03/07/2017   Dry skin 03/05/2017   Glaucoma 03/05/2017   Legally blind 03/05/2017   Retinopathy due to secondary diabetes (HCC) 02/06/2016   Diabetic polyneuropathy associated with type 2 diabetes mellitus (HCC) 01/12/2015   Fournier's gangrene into true pelvis s/p I&D 05/05/2014 05/05/2014   Type 2 diabetes mellitus with hyperlipidemia (HCC) 07/17/2011   NSTEMI (non-ST elevated myocardial infarction) (HCC) 07/17/2011   Essential hypertension 07/17/2011     Current Outpatient Medications on File Prior to Visit  Medication Sig Dispense Refill   acetaminophen (TYLENOL) 325 MG tablet Take 2 tablets (650 mg total) by mouth every 6 (six) hours as needed for mild pain (or Fever >/= 101).     ammonium lactate (AMLACTIN) 12 % lotion Apply to both feet twice daily for dry skin. 400 g 6   aspirin EC 81 MG tablet Take 1 tablet (81 mg total) by mouth daily. 30 tablet 4   atorvastatin (LIPITOR) 40 MG tablet TAKE 1 TABLET BY MOUTH EVERY DAY 90 tablet 1   brimonidine (ALPHAGAN) 0.2 % ophthalmic solution INSTILL 1 DROP INTO RIGHT EYE TWICE A DAY 15 mL 3   carvedilol (COREG) 12.5 MG tablet TAKE 1 TABLET (12.5MG  TOTAL) BY MOUTH TWICE A DAY WITH MEALS 180 tablet 1   Continuous Blood Gluc Receiver (DEXCOM G6 RECEIVER) DEVI Use to check blood sugar four times daily. E11.42 1 each 0   Continuous Blood Gluc Sensor (DEXCOM G6 SENSOR) MISC Use to check blood sugar four times daily. E11.42 3 each 6   Continuous Blood Gluc Transmit (DEXCOM G6 TRANSMITTER) MISC Use to check blood sugar four times daily. E11.42 1 each 3   dorzolamide (TRUSOPT) 2 % ophthalmic solution Place 1 drop into the right eye 2 (two) times daily.     hydrALAZINE (APRESOLINE) 100 MG tablet TAKE 1 TABLET BY MOUTH THREE TIMES A  DAY 270 tablet 1   insulin glargine (LANTUS) 100 UNIT/ML injection Inject 10 Units into the skin at bedtime.     insulin lispro (HUMALOG KWIKPEN) 100 UNIT/ML KwikPen INJECT 10 UNITS INTO THE SKIN 3 (THREE) TIMES DAILY WITH MEALS. 15 mL 3   Insulin Pen Needle (BD PEN NEEDLE NANO 2ND GEN) 32G X 4 MM MISC USE AS DIRECTED 3 TIMES A DAY 100 each 0   isosorbide mononitrate (IMDUR) 120 MG 24 hr tablet TAKE 1 TABLET BY MOUTH EVERY DAY 90 tablet 0   Lancets MISC Use as directed.  Accu chek 2 100 each 11   latanoprost (XALATAN) 0.005 % ophthalmic solution Place 1 drop into the right eye at bedtime.     lisinopril (ZESTRIL) 20 MG tablet Take 20 mg by  mouth daily.     Multiple Vitamins-Minerals (MULTIVITAMIN WITH MINERALS) tablet Take 1 tablet by mouth daily.     RHOPRESSA 0.02 % SOLN Place 1 drop into the right eye at bedtime.     timolol (TIMOPTIC) 0.5 % ophthalmic solution Place 1 drop into the right eye 2 (two) times daily.     torsemide (DEMADEX) 20 MG tablet TAKE 2 TABLETS (40MG ) BY MOUTH IN THE MORNING AND 1 TABLET (20MG ) BY MOUTH IN THE EVENING KEEP APPT 90 tablet 1   traZODone (DESYREL) 50 MG tablet TAKE 1/2 TABLET BY MOUTH EVERY DAY AT BEDTIME 45 tablet 0   No current facility-administered medications on file prior to visit.    Allergies  Allergen Reactions   Empagliflozin Other (See Comments)    Four gangrene    Social History   Socioeconomic History   Marital status: Single    Spouse name: Not on file   Number of children: Not on file   Years of education: Not on file   Highest education level: Not on file  Occupational History   Occupation: Janitor    Employer: [employer]  Tobacco Use   Smoking status: Never   Smokeless tobacco: Never  Vaping Use   Vaping status: Never Used  Substance and Sexual Activity   Alcohol use: No   Drug use: No   Sexual activity: Never  Other Topics Concern   Not on file  Social History Narrative   Works at [employer]   NOK=[phone] Susie Hitz    Social Determinants of Health   Financial Resource Strain: Low Risk  (11/07/2022)   Overall Financial Resource Strain (CARDIA)    Difficulty of Paying Living Expenses: Not hard at all  Food Insecurity: No Food Insecurity (11/07/2022)   Hunger Vital Sign    Worried About Running Out of Food in the Last Year: Never true    Ran Out of Food in the Last Year: Never true  Transportation Needs: No Transportation Needs (11/07/2022)   PRAPARE - Administrator, Civil Service (Medical): No    Lack of Transportation (Non-Medical): No  Physical Activity: Inactive (11/07/2022)   Exercise Vital Sign    Days of Exercise per Week: 0 days    Minutes of Exercise per Session: 0 min  Stress: No Stress Concern Present (11/07/2022)   [employer] of Occupational Health - Occupational Stress Questionnaire    Feeling of Stress : Not at all  Social Connections: Socially Isolated (11/07/2022)   Social Connection and Isolation Panel [NHANES]    Frequency of Communication with Friends and Family: More than three times a week    Frequency of Social Gatherings with Friends and Family: Once a week    Attends Religious Services: Never    Database administrator or Organizations: No    Attends Banker Meetings: Never    Marital Status: Never married  Intimate Partner Violence: Not At Risk (05/05/2021)   Humiliation, Afraid, Rape, and Kick questionnaire    Fear of Current or Ex-Partner: No    Emotionally Abused: No    Physically Abused: No    Sexually Abused: No    Family History  Problem Relation Age of Onset   Coronary artery disease Father        Developed in his 54s   Kidney disease Father    Diabetes Father    Melanoma Father    Rectal cancer Father    Heart failure Mother    Hypertension Mother  Diabetes Sister    Diabetes Brother    Colon cancer Neg Hx    Esophageal cancer Neg Hx    Stomach cancer Neg Hx     Past Surgical History:  Procedure Laterality Date    CATARACT EXTRACTION Right 2019   Dr. Dione Booze   CHOLECYSTECTOMY N/A 09/21/2017   Procedure: LAPAROSCOPIC CHOLECYSTECTOMY WITH INTRAOPERATIVE CHOLANGIOGRAM;  Surgeon: Karie Soda, MD;  Location: [facility];  Service: General;  Laterality: N/A;   COLONOSCOPY  2000   Dr. Loreta Ave   EYE SURGERY     IRRIGATION AND DEBRIDEMENT ABSCESS Left 05/05/2014   Procedure: IRRIGATION AND DEBRIDEMENT ABSCESS left buttock;  Surgeon: Karie Soda, MD;  Location: [facility];  Service: General;  Laterality: Left;   LEFT HEART CATHETERIZATION WITH CORONARY ANGIOGRAM N/A 07/18/2011   Procedure: LEFT HEART CATHETERIZATION WITH CORONARY ANGIOGRAM;  Surgeon: Herby Abraham, MD;  Location: [facility] CATH LAB;  Service: Cardiovascular;  Laterality: N/A;   PERCUTANEOUS CORONARY STENT INTERVENTION (PCI-S)  07/18/2011   Procedure: PERCUTANEOUS CORONARY STENT INTERVENTION (PCI-S);  Surgeon: Herby Abraham, MD;  Location: [facility] CATH LAB;  Service: Cardiovascular;;    ROS: Review of Systems Negative except as stated above  PHYSICAL EXAM: BP (!) 171/72   Pulse (!) 50   Temp 97.9 F (36.6 C) (Oral)   Ht 5\' 10"  (1.778 m)   Wt 215 lb (97.5 kg)   SpO2 99%   BMI 30.85 kg/m   Wt Readings from Last 3 Encounters:  06/16/23 215 lb (97.5 kg)  01/06/23 217 lb 6.4 oz (98.6 kg)  10/24/22 214 lb (97.1 kg)    Physical Exam  General appearance -elderly Caucasian male in NAD.  Clothing slightly soiled.  Ambulates with a rolling wheeled walker Mental status - normal mood, behavior, speech, dress, motor activity, and thought processes Neck - supple, no significant adenopathy Chest - clear to auscultation, no wheezes, rales or rhonchi, symmetric air entry Heart -rate and rhythm Abdomen - soft, nontender, nondistended, no masses or organomegaly Extremities -1+ bilateral lower extremity edema Skin -dry flaky skin on the lower extremities.     06/16/2023    2:57 PM 10/11/2021   11:41 AM 06/17/2021   10:43 AM  Depression screen PHQ 2/9   Decreased Interest 1 0 0  Down, Depressed, Hopeless 0 0 0  PHQ - 2 Score 1 0 0  Tired, decreased energy 0    Change in appetite 0    Feeling bad or failure about yourself  0    Trouble concentrating 0    Moving slowly or fidgety/restless 0    Suicidal thoughts 0    Difficult doing work/chores Not difficult at all         Latest Ref Rng & Units 10/24/2022    3:45 PM 01/10/2022   10:14 AM 10/18/2021   11:54 AM  CMP  Glucose 70 - 99 mg/dL  413  244   BUN 8 - 27 mg/dL  49  39   Creatinine 0.10 - 1.27 mg/dL  2.72  5.36   Sodium 644 - 144 mmol/L  145  142   Potassium 3.5 - 5.2 mmol/L  3.9  4.1   Chloride 96 - 106 mmol/L  103  102   CO2 20 - 29 mmol/L  31  26   Calcium 8.6 - 10.2 mg/dL  9.1  9.5   Total Protein 6.0 - 8.5 g/dL 6.1   6.6   Total Bilirubin 0.0 - 1.2 mg/dL 0.4   0.4  Alkaline Phos 44 - 121 IU/L 66   70   AST 0 - 40 IU/L 24   23   ALT 0 - 44 IU/L 22   13    Lipid Panel     Component Value Date/Time   CHOL 98 (L) 10/24/2022 1545   TRIG 91 10/24/2022 1545   HDL 35 (L) 10/24/2022 1545   CHOLHDL 2.8 10/24/2022 1545   CHOLHDL 2.9 09/08/2021 0500   VLDL 8 09/08/2021 0500   LDLCALC 45 10/24/2022 1545    CBC    Component Value Date/Time   WBC 5.7 10/18/2021 1154   WBC 9.6 09/14/2021 0315   RBC 3.56 (L) 10/18/2021 1154   RBC 2.80 (L) 09/14/2021 0315   HGB 10.3 (L) 10/18/2021 1154   HCT 30.7 (L) 10/18/2021 1154   PLT 150 10/18/2021 1154   MCV 86 10/18/2021 1154   MCH 28.9 10/18/2021 1154   MCH 28.2 09/14/2021 0315   MCHC 33.6 10/18/2021 1154   MCHC 32.8 09/14/2021 0315   RDW 14.5 10/18/2021 1154   LYMPHSABS 0.5 (L) 09/07/2021 1030   LYMPHSABS 1.8 07/12/2019 1042   MONOABS 0.8 09/07/2021 1030   EOSABS 0.0 09/07/2021 1030   EOSABS 0.3 07/12/2019 1042   BASOSABS 0.0 09/07/2021 1030   BASOSABS 0.1 07/12/2019 1042    ASSESSMENT AND PLAN:1. Type 2 diabetes mellitus with diabetic polyneuropathy, with long-term current use of insulin (HCC) At goal but some early  morning lows.  We discuss decreasing Lantus to 9 units at bedtime - POCT glycosylated hemoglobin (Hb A1C) - POCT glucose (manual entry) - Microalbumin / creatinine urine ratio  2. Hypertension associated with diabetes (HCC) Not at goal today but improved on repeat check.  On maximum dose of hydralazine.  Beta-blocker cannot be increased due to bradycardia and he is already on a good dose of Imdur.  Advised that he calls back once he gets home to verify whether he is on the 20 mg of lisinopril as he should be.  If he is on the 20 mg, then we can have him increase the torsemide to 20 mg 3 times a day at least twice a week.  This will help decrease some of the lower extremity edema that he is having.  Dietary counseling given.  Return in a few weeks for recheck blood pressure by our clinical pharmacist.    3. Coronary artery disease of native artery of native heart with stable angina pectoris (HCC) Continue carvedilol, Imdur and atorvastatin  4. Chronic systolic congestive heart failure (HCC) Compensated but still has significant lower extremity edema.  Dietary counseling given.  Increase torsemide to 3 times a day twice a week.  5. CKD (chronic kidney disease) stage 4, GFR 15-29 ml/min (HCC) Will continue to monitor.  Plugged in with nephrology.  6. Anemia due to stage 4 chronic kidney disease (HCC)   7. Constipation, unspecified constipation type Encourage increase fiber in the diet in the forms of fruits and green leafy vegetables.  Okay to use MiraLAX as needed  8. Nausea May have a component of gastroparesis.  Will prescribe Zofran for him to use as needed. - ondansetron (ZOFRAN) 4 MG tablet; Take 1 tablet (4 mg total) by mouth daily as needed for nausea or vomiting (as needed for nausea).  Dispense: 30 tablet; Refill: 1  9. Vivid dream Patient has vivid dreams that are not disturbing to him.  Will hold off on prescribing prazosin as patient is already on quite a number of medications.  I do not think any of his current medications may be causing the dreams.  10. Encounter for immunization - Flu Vaccine Trivalent High Dose (Fluad)    Patient was given the opportunity to ask questions.  Patient verbalized understanding of the plan and was able to repeat key elements of the plan.   This documentation was completed using Paediatric nurse.  Any transcriptional errors are unintentional.  Orders Placed This Encounter  Procedures   Flu Vaccine Trivalent High Dose (Fluad)   Microalbumin / creatinine urine ratio   POCT glycosylated hemoglobin (Hb A1C)   POCT glucose (manual entry)     Requested Prescriptions   Signed Prescriptions Disp Refills   ondansetron (ZOFRAN) 4 MG tablet 30 tablet 1    Sig: Take 1 tablet (4 mg total) by mouth daily as needed for nausea or vomiting (as needed for nausea).    Return in about 4 months (around 10/16/2023) for BP check Derek Lowery in 4 wks.  Jonah Blue, MD, FACP

## 2023-06-16 NOTE — Telephone Encounter (Signed)
Forwarding to Dr. Laural Benes for her knowledge.

## 2023-06-16 NOTE — Telephone Encounter (Signed)
Patient called stated Dr Laural Benes wanted him to call back to let her know the strength of his lisinopril (ZESTRIL). Patient stated it is 20mg 

## 2023-06-17 ENCOUNTER — Encounter: Payer: Self-pay | Admitting: Internal Medicine

## 2023-06-17 DIAGNOSIS — E113511 Type 2 diabetes mellitus with proliferative diabetic retinopathy with macular edema, right eye: Secondary | ICD-10-CM | POA: Diagnosis not present

## 2023-06-17 LAB — MICROALBUMIN / CREATININE URINE RATIO
Creatinine, Urine: 32.4 mg/dL
Microalb/Creat Ratio: 790 mg/g{creat} — ABNORMAL HIGH (ref 0–29)
Microalbumin, Urine: 256 ug/mL

## 2023-06-21 ENCOUNTER — Other Ambulatory Visit: Payer: Self-pay | Admitting: Internal Medicine

## 2023-06-21 DIAGNOSIS — I5032 Chronic diastolic (congestive) heart failure: Secondary | ICD-10-CM

## 2023-06-22 NOTE — Telephone Encounter (Signed)
Requested medication (s) are due for refill today: yes  Requested medication (s) are on the active medication list: yes  Last refill:  04/23/23#90 1 RF  Future visit scheduled: yes  Notes to clinic:  overdue lab work   Requested Prescriptions  Pending Prescriptions Disp Refills   torsemide (DEMADEX) 20 MG tablet [Pharmacy Med Name: TORSEMIDE 20 MG TABLET] 90 tablet 1    Sig: TAKE 2 TABLETS (40MG ) BY MOUTH IN THE MORNING AND 1 TABLET (20MG ) BY MOUTH IN THE EVENING KEEP APPT     Cardiovascular:  Diuretics - Loop Failed - 06/21/2023  1:13 AM      Failed - K in normal range and within 180 days    Potassium  Date Value Ref Range Status  01/10/2022 3.9 3.5 - 5.2 mmol/L Final         Failed - Ca in normal range and within 180 days    Calcium  Date Value Ref Range Status  01/10/2022 9.1 8.6 - 10.2 mg/dL Final   Calcium, Ion  Date Value Ref Range Status  09/08/2021 1.08 (L) 1.15 - 1.40 mmol/L Final         Failed - Na in normal range and within 180 days    Sodium  Date Value Ref Range Status  01/10/2022 145 (H) 134 - 144 mmol/L Final         Failed - Cr in normal range and within 180 days    Creat  Date Value Ref Range Status  09/02/2016 0.80 0.70 - 1.33 mg/dL Final    Comment:      For patients > or = 65 years of age: The upper reference limit for Creatinine is approximately 13% higher for people identified as African-American.      Creatinine, Ser  Date Value Ref Range Status  01/10/2022 1.86 (H) 0.76 - 1.27 mg/dL Final   Creatinine, Urine  Date Value Ref Range Status  06/30/2020 97.16 mg/dL Final    Comment:    Performed at [facility], [address] [place], [place] [address]         Failed - Cl in normal range and within 180 days    Chloride  Date Value Ref Range Status  01/10/2022 103 96 - 106 mmol/L Final         Failed - Mg Level in normal range and within 180 days    Magnesium  Date Value Ref Range Status  09/15/2021 2.2 1.7 - 2.4 mg/dL  Final    Comment:    Performed at [facility], [address] [place], [place] [address]         Failed - Last BP in normal range    BP Readings from Last 1 Encounters:  06/16/23 (!) 171/72         Passed - Valid encounter within last 6 months    Recent Outpatient Visits           6 days ago Type 2 diabetes mellitus with diabetic polyneuropathy, with long-term current use of insulin (HCC)   [facility] Of [place]. [facility] Jonah Blue B, MD   8 months ago Type 2 diabetes mellitus with diabetic polyneuropathy, with long-term current use of insulin (HCC)   [facility] Of [place]. [facility] Jonah Blue B, MD   1 year ago Type 2 diabetes mellitus with diabetic polyneuropathy, with long-term  current use of insulin (HCC)   [facility] [place] - [facility] Dept Of [place]. [facility] Marcine Matar, MD   1 year ago Hospital discharge follow-up   [facility] - [facility] Dept Of [place]. [facility] Jonah Blue B, MD   2 years ago Type 2 diabetes mellitus with diabetic polyneuropathy, with long-term current use of insulin (HCC)   [facility] [place] - A Dept Of [place]. [facility] Marcine Matar, MD       Future Appointments             In 3 weeks Lois Huxley, Cornelius Moras, RPH-CPP [facility] - A Dept Of [place] Brideg[place]. [facility]   In 3 months Laural Benes, Binnie Rail, MD [facility] [place] - A Dept Of [place] Bridegroom. [facility]

## 2023-07-12 ENCOUNTER — Other Ambulatory Visit: Payer: Self-pay | Admitting: Internal Medicine

## 2023-07-12 DIAGNOSIS — E118 Type 2 diabetes mellitus with unspecified complications: Secondary | ICD-10-CM

## 2023-07-13 NOTE — Progress Notes (Unsigned)
   S:    67 y.o. male who presents for hypertension evaluation, education, and management. PMH is significant for HTN, NSTEMI 08/2021, CHF (EF 30-35% 09/2021), CAD, T2DM with neuropathy and retinopathy, HLD chronic diarrhea, CKD, and anemia.  Patient was referred and last seen by Primary Care Provider, Dr. Laural Benes, on 06/16/23. At last visit, BP 171/72. PCP noted cannot increase beta-blocker d/t bradycardia. Patient to increase torsemide to 20mg  TID at least 2x/week.  Today, patient arrives in *** spirits and presents without *** assistance. *** Denies dizziness, headache, blurred vision, swelling.   Patient reports hypertension was diagnosed in ***.   Family/Social history: -Fhx: CAD, kidney disease, DM, HF, HTN -Tobacco: never -Alcohol: ***  Medication adherence *** . Patient has *** taken BP medications today.   Current antihypertensives include: carvedilol 12.5mg  BID, hydralazine 100mg  TID, isosorbide mononitrate 120mg  daily, lisinopril 20mg  daily, torsemide 40mg  QAM and 20mg  QPM ***  Antihypertensives tried in the past include: amlodipine (stopped for new systolic dysfunction)  Reported home BP readings: ***  Patient reported dietary habits: Eats *** meals/day Breakfast: *** Lunch: *** Dinner: *** Snacks: *** Drinks: ***  Patient-reported exercise habits: ***  O:  Last 3 Office BP readings: BP Readings from Last 3 Encounters:  06/16/23 (!) 171/72  01/06/23 (!) 142/82  10/24/22 134/67    BMET    Component Value Date/Time   NA 145 (H) 01/10/2022 1014   K 3.9 01/10/2022 1014   CL 103 01/10/2022 1014   CO2 31 (H) 01/10/2022 1014   GLUCOSE 149 (H) 01/10/2022 1014   GLUCOSE 185 (H) 09/16/2021 0536   BUN 49 (H) 01/10/2022 1014   CREATININE 1.86 (H) 01/10/2022 1014   CREATININE 0.80 09/02/2016 0902   CALCIUM 9.1 01/10/2022 1014   GFRNONAA 24 (L) 09/16/2021 0536   GFRNONAA >89 07/21/2016 0956   GFRAA 80 07/12/2019 1042   GFRAA >89 07/21/2016 0956    Renal  function: CrCl cannot be calculated (Patient's most recent lab result is older than the maximum 21 days allowed.).  Clinical ASCVD: Yes  The ASCVD Risk score (Arnett DK, et al., 2019) failed to calculate for the following reasons:   The patient has a prior MI or stroke diagnosis   A/P: Hypertension diagnosed *** currently *** on current medications. BP goal < 130/80 *** mmHg. Medication adherence appears ***. Control is suboptimal due to ***.  -carvedilol 12.5mg  BID -hydralazine 100mg  TID -isosorbide mononitrate 120mg  daily -lisinopril 20mg  daily -torsemide 40mg  QAM and 20mg  QPM -Patient educated on purpose, proper use, and potential adverse effects of ***.  -F/u labs ordered - *** -Counseled on lifestyle modifications for blood pressure control including reduced dietary sodium, increased exercise, adequate sleep. -Encouraged patient to check BP at home and bring log of readings to next visit. Counseled on proper use of home BP cuff.   Results reviewed and written information provided.    Written patient instructions provided. Patient verbalized understanding of treatment plan.  Total time in face to face counseling *** minutes.    Follow-up:  Pharmacist: ***. PCP clinic visit: 10/16/23.   Nicole Kindred, PharmD PGY1 Pharmacy Resident 07/13/2023 10:11 PM

## 2023-07-14 ENCOUNTER — Ambulatory Visit: Payer: 59 | Admitting: Pharmacist

## 2023-07-17 ENCOUNTER — Other Ambulatory Visit: Payer: Self-pay | Admitting: Internal Medicine

## 2023-07-17 DIAGNOSIS — E1159 Type 2 diabetes mellitus with other circulatory complications: Secondary | ICD-10-CM

## 2023-07-17 DIAGNOSIS — I25118 Atherosclerotic heart disease of native coronary artery with other forms of angina pectoris: Secondary | ICD-10-CM

## 2023-07-22 ENCOUNTER — Other Ambulatory Visit: Payer: Self-pay | Admitting: Internal Medicine

## 2023-07-22 DIAGNOSIS — I5032 Chronic diastolic (congestive) heart failure: Secondary | ICD-10-CM

## 2023-07-26 ENCOUNTER — Other Ambulatory Visit: Payer: Self-pay | Admitting: Internal Medicine

## 2023-07-26 DIAGNOSIS — E1142 Type 2 diabetes mellitus with diabetic polyneuropathy: Secondary | ICD-10-CM

## 2023-07-27 DIAGNOSIS — E113511 Type 2 diabetes mellitus with proliferative diabetic retinopathy with macular edema, right eye: Secondary | ICD-10-CM | POA: Diagnosis not present

## 2023-08-13 ENCOUNTER — Other Ambulatory Visit: Payer: Self-pay | Admitting: Internal Medicine

## 2023-08-13 DIAGNOSIS — F5101 Primary insomnia: Secondary | ICD-10-CM

## 2023-08-19 ENCOUNTER — Other Ambulatory Visit: Payer: Self-pay | Admitting: Internal Medicine

## 2023-08-19 DIAGNOSIS — Z794 Long term (current) use of insulin: Secondary | ICD-10-CM

## 2023-08-21 DIAGNOSIS — H4089 Other specified glaucoma: Secondary | ICD-10-CM | POA: Diagnosis not present

## 2023-08-21 DIAGNOSIS — Z794 Long term (current) use of insulin: Secondary | ICD-10-CM | POA: Diagnosis not present

## 2023-08-21 DIAGNOSIS — Z961 Presence of intraocular lens: Secondary | ICD-10-CM | POA: Diagnosis not present

## 2023-08-21 DIAGNOSIS — E113553 Type 2 diabetes mellitus with stable proliferative diabetic retinopathy, bilateral: Secondary | ICD-10-CM | POA: Diagnosis not present

## 2023-08-21 DIAGNOSIS — H4052X3 Glaucoma secondary to other eye disorders, left eye, severe stage: Secondary | ICD-10-CM | POA: Diagnosis not present

## 2023-08-21 LAB — HM DIABETES EYE EXAM

## 2023-08-28 ENCOUNTER — Other Ambulatory Visit: Payer: Self-pay | Admitting: Internal Medicine

## 2023-09-01 DIAGNOSIS — E785 Hyperlipidemia, unspecified: Secondary | ICD-10-CM | POA: Diagnosis not present

## 2023-09-01 DIAGNOSIS — I5020 Unspecified systolic (congestive) heart failure: Secondary | ICD-10-CM | POA: Diagnosis not present

## 2023-09-01 DIAGNOSIS — D649 Anemia, unspecified: Secondary | ICD-10-CM | POA: Diagnosis not present

## 2023-09-01 DIAGNOSIS — I129 Hypertensive chronic kidney disease with stage 1 through stage 4 chronic kidney disease, or unspecified chronic kidney disease: Secondary | ICD-10-CM | POA: Diagnosis not present

## 2023-09-01 DIAGNOSIS — E1122 Type 2 diabetes mellitus with diabetic chronic kidney disease: Secondary | ICD-10-CM | POA: Diagnosis not present

## 2023-09-01 DIAGNOSIS — N1832 Chronic kidney disease, stage 3b: Secondary | ICD-10-CM | POA: Diagnosis not present

## 2023-09-01 DIAGNOSIS — R809 Proteinuria, unspecified: Secondary | ICD-10-CM | POA: Diagnosis not present

## 2023-09-01 LAB — LAB REPORT - SCANNED
Albumin, Urine POC: 349
Albumin/Creatinine Ratio, Urine, POC: 978
Creatinine, POC: 35.7 mg/dL
EGFR: 34

## 2023-09-02 ENCOUNTER — Other Ambulatory Visit: Payer: Self-pay | Admitting: Internal Medicine

## 2023-09-02 DIAGNOSIS — Z794 Long term (current) use of insulin: Secondary | ICD-10-CM

## 2023-09-11 DIAGNOSIS — E113511 Type 2 diabetes mellitus with proliferative diabetic retinopathy with macular edema, right eye: Secondary | ICD-10-CM | POA: Diagnosis not present

## 2023-09-25 DIAGNOSIS — B351 Tinea unguium: Secondary | ICD-10-CM | POA: Diagnosis not present

## 2023-09-25 DIAGNOSIS — L851 Acquired keratosis [keratoderma] palmaris et plantaris: Secondary | ICD-10-CM | POA: Diagnosis not present

## 2023-09-25 DIAGNOSIS — E1142 Type 2 diabetes mellitus with diabetic polyneuropathy: Secondary | ICD-10-CM | POA: Diagnosis not present

## 2023-10-05 ENCOUNTER — Telehealth: Payer: Self-pay

## 2023-10-05 DIAGNOSIS — R110 Nausea: Secondary | ICD-10-CM

## 2023-10-05 DIAGNOSIS — E1142 Type 2 diabetes mellitus with diabetic polyneuropathy: Secondary | ICD-10-CM | POA: Diagnosis not present

## 2023-10-05 DIAGNOSIS — K5900 Constipation, unspecified: Secondary | ICD-10-CM

## 2023-10-05 NOTE — Telephone Encounter (Signed)
Referral submitted to GI

## 2023-10-05 NOTE — Addendum Note (Signed)
Addended by: Jonah Blue B on: 10/05/2023 10:22 PM   Modules accepted: Orders

## 2023-10-05 NOTE — Telephone Encounter (Signed)
Patient is needing new GI referral, preference is listed below.    Copied from CRM [id number]. Topic: Referral - Request for Referral >> Oct 05, 2023  1:14 PM Derek Lowery wrote: Did the patient discuss referral with their provider in the last year? Yes (If No - schedule appointment) (If Yes - send message)  Appointment offered? Yes. Missed a few appts and now they will not see the patient.  Type of order/referral and detailed reason for visit: Gastroenterologist for stomach issues, constipation for over a year  Preference of office, provider, location: Dr. Charna Elizabeth [address] [place] [address]  If referral order, have you been seen by this specialty before? No (If Yes, this issue or another issue? When? Where?  Can we respond through MyChart? No

## 2023-10-06 NOTE — Telephone Encounter (Signed)
Called but no answer. LVM informing patient that a referral to GI has been submitted.

## 2023-10-16 ENCOUNTER — Ambulatory Visit: Payer: Self-pay | Admitting: Internal Medicine

## 2023-10-20 ENCOUNTER — Other Ambulatory Visit: Payer: Self-pay | Admitting: Internal Medicine

## 2023-10-20 DIAGNOSIS — I5032 Chronic diastolic (congestive) heart failure: Secondary | ICD-10-CM

## 2023-10-20 DIAGNOSIS — E785 Hyperlipidemia, unspecified: Secondary | ICD-10-CM

## 2023-10-24 ENCOUNTER — Other Ambulatory Visit: Payer: Self-pay | Admitting: Internal Medicine

## 2023-10-26 NOTE — Telephone Encounter (Signed)
 Requested Prescriptions  Pending Prescriptions Disp Refills   hydrALAZINE (APRESOLINE) 100 MG tablet [Pharmacy Med Name: HYDRALAZINE 100 MG TABLET] 270 tablet 0    Sig: TAKE 1 TABLET BY MOUTH THREE TIMES A DAY     Cardiovascular:  Vasodilators Failed - 10/26/2023 12:42 PM      Failed - HCT in normal range and within 360 days    Hematocrit  Date Value Ref Range Status  10/18/2021 30.7 (L) 37.5 - 51.0 % Final         Failed - HGB in normal range and within 360 days    Hemoglobin  Date Value Ref Range Status  10/18/2021 10.3 (L) 13.0 - 17.7 g/dL Final   Total hemoglobin  Date Value Ref Range Status  09/16/2021 8.3 (L) 12.0 - 16.0 g/dL Final         Failed - RBC in normal range and within 360 days    RBC  Date Value Ref Range Status  10/18/2021 3.56 (L) 4.14 - 5.80 x10E6/uL Final  09/14/2021 2.80 (L) 4.22 - 5.81 MIL/uL Final         Failed - WBC in normal range and within 360 days    WBC  Date Value Ref Range Status  10/18/2021 5.7 3.4 - 10.8 x10E3/uL Final  09/14/2021 9.6 4.0 - 10.5 K/uL Final         Failed - PLT in normal range and within 360 days    Platelets  Date Value Ref Range Status  10/18/2021 150 150 - 450 x10E3/uL Final         Failed - ANA Screen, Ifa, Serum in normal range and within 360 days    No results found for: "ANA", "ANATITER", "LABANTI"       Failed - Last BP in normal range    BP Readings from Last 1 Encounters:  06/16/23 (!) 171/72         Passed - Valid encounter within last 12 months    Recent Outpatient Visits           4 months ago Type 2 diabetes mellitus with diabetic polyneuropathy, with long-term current use of insulin (HCC)   [facility] [place] Jonah Blue B, MD   1 year ago Type 2 diabetes mellitus with diabetic polyneuropathy, with long-term current use of insulin (HCC)   [facility] Of [place] Jonah Blue B,  MD   1 year ago Type 2 diabetes mellitus with diabetic polyneuropathy, with long-term current use of insulin (HCC)   [facility] Of [place]. [facility] Marcine Matar, MD   2 years ago Hospital discharge follow-up   [facility] Dept Of [place]. [facility] Jonah Blue B, MD   2 years ago Type 2 diabetes mellitus with diabetic polyneuropathy, with long-term current use of insulin (HCC)   [facility] Of [place]. [facility] Marcine Matar, MD       Future Appointments             In 1 month Laural Benes Binnie Rail, MD [facility] - A Dept Of [place]. [facility]

## 2023-10-27 ENCOUNTER — Encounter: Payer: Self-pay | Admitting: Physician Assistant

## 2023-10-30 DIAGNOSIS — E113511 Type 2 diabetes mellitus with proliferative diabetic retinopathy with macular edema, right eye: Secondary | ICD-10-CM | POA: Diagnosis not present

## 2023-11-03 ENCOUNTER — Other Ambulatory Visit: Payer: Self-pay | Admitting: Internal Medicine

## 2023-11-03 DIAGNOSIS — I25118 Atherosclerotic heart disease of native coronary artery with other forms of angina pectoris: Secondary | ICD-10-CM

## 2023-11-03 DIAGNOSIS — I152 Hypertension secondary to endocrine disorders: Secondary | ICD-10-CM

## 2023-11-03 DIAGNOSIS — E1142 Type 2 diabetes mellitus with diabetic polyneuropathy: Secondary | ICD-10-CM

## 2023-11-03 DIAGNOSIS — R110 Nausea: Secondary | ICD-10-CM

## 2023-11-03 DIAGNOSIS — I5032 Chronic diastolic (congestive) heart failure: Secondary | ICD-10-CM

## 2023-11-03 DIAGNOSIS — E785 Hyperlipidemia, unspecified: Secondary | ICD-10-CM

## 2023-12-03 ENCOUNTER — Other Ambulatory Visit: Payer: Self-pay | Admitting: Internal Medicine

## 2023-12-15 ENCOUNTER — Ambulatory Visit: Payer: 59 | Admitting: Internal Medicine

## 2023-12-16 ENCOUNTER — Other Ambulatory Visit (INDEPENDENT_AMBULATORY_CARE_PROVIDER_SITE_OTHER)

## 2023-12-16 ENCOUNTER — Encounter: Payer: Self-pay | Admitting: Physician Assistant

## 2023-12-16 ENCOUNTER — Ambulatory Visit: Admitting: Physician Assistant

## 2023-12-16 VITALS — BP 116/60 | HR 58 | Ht 70.0 in | Wt 213.4 lb

## 2023-12-16 DIAGNOSIS — R6881 Early satiety: Secondary | ICD-10-CM

## 2023-12-16 DIAGNOSIS — R140 Abdominal distension (gaseous): Secondary | ICD-10-CM

## 2023-12-16 DIAGNOSIS — R194 Change in bowel habit: Secondary | ICD-10-CM

## 2023-12-16 DIAGNOSIS — R1084 Generalized abdominal pain: Secondary | ICD-10-CM

## 2023-12-16 DIAGNOSIS — R109 Unspecified abdominal pain: Secondary | ICD-10-CM | POA: Diagnosis not present

## 2023-12-16 DIAGNOSIS — R931 Abnormal findings on diagnostic imaging of heart and coronary circulation: Secondary | ICD-10-CM

## 2023-12-16 LAB — CBC WITH DIFFERENTIAL/PLATELET
Basophils Absolute: 0.1 10*3/uL (ref 0.0–0.1)
Basophils Relative: 1.2 % (ref 0.0–3.0)
Eosinophils Absolute: 0.4 10*3/uL (ref 0.0–0.7)
Eosinophils Relative: 5.6 % — ABNORMAL HIGH (ref 0.0–5.0)
HCT: 33.4 % — ABNORMAL LOW (ref 39.0–52.0)
Hemoglobin: 11.3 g/dL — ABNORMAL LOW (ref 13.0–17.0)
Lymphocytes Relative: 25.1 % (ref 12.0–46.0)
Lymphs Abs: 1.8 10*3/uL (ref 0.7–4.0)
MCHC: 33.7 g/dL (ref 30.0–36.0)
MCV: 91.3 fl (ref 78.0–100.0)
Monocytes Absolute: 0.9 10*3/uL (ref 0.1–1.0)
Monocytes Relative: 11.8 % (ref 3.0–12.0)
Neutro Abs: 4.1 10*3/uL (ref 1.4–7.7)
Neutrophils Relative %: 56.3 % (ref 43.0–77.0)
Platelets: 146 10*3/uL — ABNORMAL LOW (ref 150.0–400.0)
RBC: 3.65 Mil/uL — ABNORMAL LOW (ref 4.22–5.81)
RDW: 13.7 % (ref 11.5–15.5)
WBC: 7.3 10*3/uL (ref 4.0–10.5)

## 2023-12-16 LAB — COMPREHENSIVE METABOLIC PANEL WITH GFR
ALT: 23 U/L (ref 0–53)
AST: 23 U/L (ref 0–37)
Albumin: 4.2 g/dL (ref 3.5–5.2)
Alkaline Phosphatase: 47 U/L (ref 39–117)
BUN: 60 mg/dL — ABNORMAL HIGH (ref 6–23)
CO2: 25 meq/L (ref 19–32)
Calcium: 8.9 mg/dL (ref 8.4–10.5)
Chloride: 110 meq/L (ref 96–112)
Creatinine, Ser: 2.47 mg/dL — ABNORMAL HIGH (ref 0.40–1.50)
GFR: 26.61 mL/min — ABNORMAL LOW (ref >60.00)
Glucose, Bld: 101 mg/dL — ABNORMAL HIGH (ref 70–99)
Potassium: 4.6 meq/L (ref 3.5–5.1)
Sodium: 144 meq/L (ref 135–145)
Total Bilirubin: 0.6 mg/dL (ref 0.2–1.2)
Total Protein: 6.5 g/dL (ref 6.0–8.3)

## 2023-12-16 LAB — TSH: TSH: 2.82 u[IU]/mL (ref 0.35–5.50)

## 2023-12-16 NOTE — Progress Notes (Signed)
 Let patient know that I received results of the labs that he had done through the gastroenterologist yesterday.  Kidney function is a little worse compared to when it was last checked by his nephrologist in January.  Looks like he may be a bit dehydrated.  Advised that he drink at least 4 glasses of water daily.  Please give follow-up appointment with me.  He no showed his appointment earlier this week.

## 2023-12-16 NOTE — Progress Notes (Addendum)
 Chief Complaint: Constipation and nausea  HPI:    [name] is a 68 year old male with a past medical history as listed below including CAD (09/08/2021 LVEF 30 to 35%) and diabetes, known to Dr. Willy Harvest from a colonoscopy in 2018, who was referred to me by Lawrance Presume, MD for a complaint of constipation and nausea.      01/22/2017 colonoscopy was normal.  Repeat recommended 10 years.    Today, patient presents to clinic and has a constellation of issues.  He first explains his constipation.  Tells me that he will go 4 to 5 days without a bowel movement and then will have a large solid stool followed by uncontrollable diarrhea that is urgent and causes incontinence for which he wears Depends.  He will then typically take Imodium  2-3 tabs in order for this to stop as it continues into the night and then will go another for 5 days without a bowel movement.  He is status post cholecystectomy years ago.  Describes a generalized abdominal pain, worse when he is constipated with some nausea.  Tells me this has been going on for the past couple of years but is certainly a large change for him.    Also describes a feeling of early satiety and bloating and some epigastric discomfort, but no overt heartburn or reflux symptoms.    Patient has not followed with any doctors or had regular labs anytime recently.  Previously notes he followed with a cardiologist who told him he had poor heart function but he has not seen them in a couple of years.    Denies fever, chills, weight loss or blood in his stool.  Past Medical History:  Diagnosis Date   Arthritis    CAD (coronary artery disease)    NSTEMI 06/2011:  LHC 07/18/11: Proximal diagonal 60%, distal LAD with a diabetic appearance and 60% stenosis, OM2 with an occluded superior branch and an inferior branch with 90%, EF 55% with inferior hypokinesis.  PCI: Promus DES to the OM2 inferior branch.  This vessel provides collaterals to the superior branch which  remained occluded.  Echocardiogram 07/18/11: EF 60%, normal wall motion.   Cataract    Essential hypertension, benign    Glaucoma    Hypercholesteremia    Internal hemorrhoids    Noncompliance    Type 2 diabetes mellitus (HCC) 1990    Past Surgical History:  Procedure Laterality Date   CATARACT EXTRACTION Right 2019   Dr. Candi Chafe   CHOLECYSTECTOMY N/A 09/21/2017   Procedure: LAPAROSCOPIC CHOLECYSTECTOMY WITH INTRAOPERATIVE CHOLANGIOGRAM;  Surgeon: Candyce Champagne, MD;  Location: [facility];  Service: General;  Laterality: N/A;   COLONOSCOPY  2000   Dr. Tova Fresh   EYE SURGERY     IRRIGATION AND DEBRIDEMENT ABSCESS Left 05/05/2014   Procedure: IRRIGATION AND DEBRIDEMENT ABSCESS left buttock;  Surgeon: Candyce Champagne, MD;  Location: [facility];  Service: General;  Laterality: Left;   LEFT HEART CATHETERIZATION WITH CORONARY ANGIOGRAM N/A 07/18/2011   Procedure: LEFT HEART CATHETERIZATION WITH CORONARY ANGIOGRAM;  Surgeon: Kristopher Pheasant, MD;  Location: [facility] C[facility] LAB;  Service: Cardiovascular;  Laterality: N/A;   PERCUTANEOUS CORONARY STENT INTERVENTION (PCI-S)  07/18/2011   Procedure: PERCUTANEOUS CORONARY STENT INTERVENTION (PCI-S);  Surgeon: Kristopher Pheasant, MD;  Location: [facility] CATH LAB;  Service: Cardiovascular;;    Current Outpatient Medications  Medication Sig Dispense Refill   acetaminophen  (TYLENOL ) 325 MG tablet Take 2 tablets (650 mg total) by mouth every 6 (six) hours as  needed for mild pain (or Fever >/= 101).     ammonium lactate  (AMLACTIN) 12 % lotion Apply to both feet twice daily for dry skin. 400 g 6   aspirin  EC 81 MG tablet Take 1 tablet (81 mg total) by mouth daily. 30 tablet 4   atorvastatin  (LIPITOR) 40 MG tablet TAKE 1 TABLET BY MOUTH EVERY DAY 90 tablet 0   brimonidine  (ALPHAGAN ) 0.2 % ophthalmic solution INSTILL 1 DROP INTO RIGHT EYE TWICE A DAY 15 mL 3   carvedilol  (COREG ) 12.5 MG tablet TAKE 1 TABLET (12.5MG  TOTAL) BY MOUTH TWICE A DAY WITH MEALS 180 tablet 0   Continuous Blood  Gluc Receiver (DEXCOM G6 RECEIVER) DEVI Use to check blood sugar four times daily. E11.42 1 each 0   Continuous Glucose Sensor (DEXCOM G6 SENSOR) MISC USE TO CHECK BLOOD SUGAR FOUR TIMES DAILY. E11.42 3 each 3   Continuous Glucose Transmitter (DEXCOM G6 TRANSMITTER) MISC USE TO CHECK BLOOD SUGAR FOUR TIMES DAILY. E11.42 1 each 2   dorzolamide  (TRUSOPT ) 2 % ophthalmic solution Place 1 drop into the right eye 2 (two) times daily.     hydrALAZINE  (APRESOLINE ) 100 MG tablet TAKE 1 TABLET BY MOUTH THREE TIMES A DAY 270 tablet 0   insulin  glargine (LANTUS ) 100 UNIT/ML injection Inject 10 Units into the skin at bedtime.     insulin  lispro (HUMALOG  KWIKPEN) 100 UNIT/ML KwikPen INJECT 10 UNITS INTO THE SKIN 3 (THREE) TIMES DAILY. 15 mL 1   Insulin  Pen Needle (BD PEN NEEDLE NANO 2ND GEN) 32G X 4 MM MISC USE AS DIRECTED 3 TIMES A DAY 100 each 6   isosorbide  mononitrate (IMDUR ) 120 MG 24 hr tablet TAKE 1 TABLET BY MOUTH EVERY DAY 90 tablet 1   Lancets MISC Use as directed.  Accu chek 2 100 each 11   latanoprost  (XALATAN ) 0.005 % ophthalmic solution Place 1 drop into the right eye at bedtime.     lisinopril  (ZESTRIL ) 20 MG tablet Take 20 mg by mouth daily.     Multiple Vitamins-Minerals (MULTIVITAMIN WITH MINERALS) tablet Take 1 tablet by mouth daily.     ondansetron  (ZOFRAN ) 4 MG tablet TAKE 1 TABLET (4 MG TOTAL) BY MOUTH DAILY AS NEEDED FOR NAUSEA OR VOMITING (AS NEEDED FOR NAUSEA). 30 tablet 1   RHOPRESSA  0.02 % SOLN Place 1 drop into the right eye at bedtime.     timolol  (TIMOPTIC ) 0.5 % ophthalmic solution Place 1 drop into the right eye 2 (two) times daily.     torsemide  (DEMADEX ) 20 MG tablet TAKE 2 TABLETS (40MG ) BY MOUTH IN THE MORNING AND 1 TABLET (20MG ) BY MOUTH IN THE EVENING. 270 tablet 0   traZODone  (DESYREL ) 50 MG tablet TAKE 1/2 TABLET BY MOUTH EVERY DAY AT BEDTIME 45 tablet 0   No current facility-administered medications for this visit.    Allergies as of 12/16/2023 - Review Complete  06/17/2023  Allergen Reaction Noted   Empagliflozin Other (See Comments) 09/27/2021    Family History  Problem Relation Age of Onset   Coronary artery disease Father        Developed in his 40s   Kidney disease Father    Diabetes Father    Melanoma Father    Rectal cancer Father    Heart failure Mother    Hypertension Mother    Diabetes Sister    Diabetes Brother    Colon cancer Neg Hx    Esophageal cancer Neg Hx    Stomach cancer Neg Hx  Social History   Socioeconomic History   Marital status: Single    Spouse name: Not on file   Number of children: Not on file   Years of education: Not on file   Highest education level: Not on file  Occupational History   Occupation: Janitor    Employer: [employer]  Tobacco Use   Smoking status: Never   Smokeless tobacco: Never  Vaping Use   Vaping status: Never Used  Substance and Sexual Activity   Alcohol use: No   Drug use: No   Sexual activity: Never  Other Topics Concern   Not on file  Social History Narrative   Works at [employer]   NOK=[phone] [employer]   Social Drivers of Health   Financial Resource Strain: Low Risk  (11/07/2022)   Overall Financial Resource Strain (CARDIA)    Difficulty of Paying Living Expenses: Not hard at all  Food Insecurity: No Food Insecurity (11/07/2022)   Hunger Vital Sign    Worried About Running Out of Food in the Last Year: Never true    Ran Out of Food in the Last Year: Never true  Transportation Needs: No Transportation Needs (11/07/2022)   PRAPARE - Administrator, Civil Service (Medical): No    Lack of Transportation (Non-Medical): No  Physical Activity: Inactive (11/07/2022)   Exercise Vital Sign    Days of Exercise per Week: 0 days    Minutes of Exercise per Session: 0 min  Stress: No Stress Concern Present (11/07/2022)   [employer] of Occupational Health - Occupational Stress Questionnaire    Feeling of Stress : Not at all  Social Connections: Socially  Isolated (11/07/2022)   Social Connection and Isolation Panel [NHANES]    Frequency of Communication with Friends and Family: More than three times a week    Frequency of Social Gatherings with Friends and Family: Once a week    Attends Religious Services: Never    Database administrator or Organizations: No    Attends Banker Meetings: Never    Marital Status: Never married  Intimate Partner Violence: Not At Risk (05/05/2021)   Humiliation, Afraid, Rape, and Kick questionnaire    Fear of Current or Ex-Partner: No    Emotionally Abused: No    Physically Abused: No    Sexually Abused: No    Review of Systems:    Constitutional: No weight loss, fever or chills Skin: No rash  Cardiovascular: No chest pain Respiratory: No SOB  Gastrointestinal: See HPI and otherwise negative Genitourinary: No dysuria  Neurological: No headache, dizziness or syncope Musculoskeletal: No new muscle or joint pain Hematologic: No bleeding Psychiatric: No history of depression or anxiety   Physical Exam:  Vital signs: BP 116/60   Pulse (!) 58   Ht 5\' 10"  (1.778 m)   Wt 213 lb 6.4 oz (96.8 kg)   BMI 30.62 kg/m    Constitutional:   Pleasant elderly Caucasian male appears to be in NAD, Well developed, Well nourished, alert and cooperative Head:  Normocephalic and atraumatic. Eyes:   PEERL, EOMI. No icterus. Conjunctiva pink. Ears:  Normal auditory acuity. Neck:  Supple Throat: Oral cavity and pharynx without inflammation, swelling or lesion.  Respiratory: Respirations even and unlabored. Lungs clear to auscultation bilaterally.   No wheezes, crackles, or rhonchi.  Cardiovascular: Normal S1, S2. No MRG. Regular rate and rhythm. No peripheral edema, cyanosis or pallor.  Gastrointestinal:  Soft, nondistended, mild generalized TTP. No rebound or guarding. Normal bowel  sounds. No appreciable masses or hepatomegaly. Rectal:  Not performed.  Msk:  Symmetrical without gross deformities. Without  edema, no deformity or joint abnormality.  Neurologic:  Alert and  oriented x4;  grossly normal neurologically.  Skin:   Dry and intact without significant lesions or rashes. Psychiatric: Demonstrates good judgement and reason without abnormal affect or behaviors.  RELEVANT LABS AND IMAGING: CBC    Component Value Date/Time   WBC 5.7 10/18/2021 1154   WBC 9.6 09/14/2021 0315   RBC 3.56 (L) 10/18/2021 1154   RBC 2.80 (L) 09/14/2021 0315   HGB 10.3 (L) 10/18/2021 1154   HCT 30.7 (L) 10/18/2021 1154   PLT 150 10/18/2021 1154   MCV 86 10/18/2021 1154   MCH 28.9 10/18/2021 1154   MCH 28.2 09/14/2021 0315   MCHC 33.6 10/18/2021 1154   MCHC 32.8 09/14/2021 0315   RDW 14.5 10/18/2021 1154   LYMPHSABS 0.5 (L) 09/07/2021 1030   LYMPHSABS 1.8 07/12/2019 1042   MONOABS 0.8 09/07/2021 1030   EOSABS 0.0 09/07/2021 1030   EOSABS 0.3 07/12/2019 1042   BASOSABS 0.0 09/07/2021 1030   BASOSABS 0.1 07/12/2019 1042    CMP     Component Value Date/Time   NA 145 (H) 01/10/2022 1014   K 3.9 01/10/2022 1014   CL 103 01/10/2022 1014   CO2 31 (H) 01/10/2022 1014   GLUCOSE 149 (H) 01/10/2022 1014   GLUCOSE 185 (H) 09/16/2021 0536   BUN 49 (H) 01/10/2022 1014   CREATININE 1.86 (H) 01/10/2022 1014   CREATININE 0.80 09/02/2016 0902   CALCIUM  9.1 01/10/2022 1014   PROT 6.1 10/24/2022 1545   ALBUMIN  3.9 10/24/2022 1545   AST 24 10/24/2022 1545   ALT 22 10/24/2022 1545   ALKPHOS 66 10/24/2022 1545   BILITOT 0.4 10/24/2022 1545   GFRNONAA 24 (L) 09/16/2021 0536   GFRNONAA >89 07/21/2016 0956   GFRAA 80 07/12/2019 1042   GFRAA >89 07/21/2016 0956    Assessment: 1.  Change in bowel habits: Towards constipation and diarrhea, prior to this very regular, now will go 4 to 5 days without a bowel movement and then have a solid stool followed by incontinence of liquid stool and diarrhea which is urgent only stopped by Imodium ; consider medication side effect versus other 2.  Abdominal pain: With above,  but also somewhat constant discomfort 3.  Early satiety/bloating: Notes a lot of bloating which could be related to baseline constipation with overflow versus gastritis, though no typical reflux symptoms  Plan: 1.  Patient has had a major shift in his bowel habits over the past couple of years.  With this along with his bloating and early satiety would recommend an EGD and colonoscopy for further evaluation.  Unfortunately last echocardiogram in 2023 showed decreased ejection fraction 35% and he has not seen a cardiologist recently.  Recommend he have cardiac clearance prior to scheduling procedures which should be scheduled at the hospital given current ejection fraction. 2.  Pending above will schedule patient for an EGD and colonoscopy at the hospital with Dr. Willy Harvest. 3.  Order labs today to include a CBC, CMP and TSH as well as celiac studies 4.  Recommend the patient start a fiber supplement such as Metamucil, Citrucel or Benefiber twice daily to see if this helps with the variance in his stools.  I am not exactly sure what his baseline is given that he takes Imodium  5.  Patient to follow in clinic per recommendations after procedures and labs above.  Reginal Capra, PA-C [facility] 12/16/2023, 9:26 AM  Cc: Lawrance Presume, MD   Gastroenterology Clinic Physician:  Agree with Ms. Juliane Och evaluation.  Would add that his bowel pattern suggest primary problem is constipation - days w/o defecation followed by formed stool and then numerous loose stools. Imodium  use will exacerbate. So fiber is a good idea but may need MiraLAx   - ideally rectal exam to check for impaction also.    Kenney Peacemaker, MD, [facility]

## 2023-12-16 NOTE — Patient Instructions (Signed)
 Your provider has requested that you go to the basement level for lab work before leaving today. Press "B" on the elevator. The lab is located at the first door on the left as you exit the elevator.   Start Benefiber  2 teaspoons in 8 ounces of liquid twice daily.  _______________________________________________________  If your blood pressure at your visit was 140/90 or greater, please contact your primary care physician to follow up on this.  _______________________________________________________  If you are age 50 or older, your body mass index should be between 23-30. Your Body mass index is 30.62 kg/m. If this is out of the aforementioned range listed, please consider follow up with your Primary Care Provider.  If you are age 26 or younger, your body mass index should be between 19-25. Your Body mass index is 30.62 kg/m. If this is out of the aformentioned range listed, please consider follow up with your Primary Care Provider.   ________________________________________________________  The [facility] providers would like to encourage you to use MYCHART to communicate with providers for non-urgent requests or questions.  Due to long hold times on the telephone, sending your provider a message by [facility] may be a faster and more efficient way to get a response.  Please allow 48 business hours for a response.  Please remember that this is for non-urgent requests.  _______________________________________________________

## 2023-12-17 LAB — TISSUE TRANSGLUTAMINASE ABS,IGG,IGA
(tTG) Ab, IgA: <1 U/mL
(tTG) Ab, IgG: <1 U/mL

## 2023-12-17 LAB — IGA: Immunoglobulin A: 268 mg/dL (ref 70–320)

## 2023-12-18 ENCOUNTER — Telehealth: Payer: Self-pay | Admitting: *Deleted

## 2023-12-18 DIAGNOSIS — E113511 Type 2 diabetes mellitus with proliferative diabetic retinopathy with macular edema, right eye: Secondary | ICD-10-CM | POA: Diagnosis not present

## 2023-12-18 NOTE — Telephone Encounter (Signed)
   Name: QUAVON WACEK  DOB: [birth year]  MRN: [id number]  Primary Cardiologist: Maudine Sos, MD  Chart reviewed as part of pre-operative protocol coverage. Because of LEKEITH SHUTE past medical history and time since last visit, he will require a follow-up in-office visit in order to better assess preoperative cardiovascular risk.  Pre-op covering staff: - Please schedule appointment and call patient to inform them. If patient already had an upcoming appointment within acceptable timeframe, please add "pre-op clearance" to the appointment notes so provider is aware. - Please contact requesting surgeon's office via preferred method (i.e, phone, fax) to inform them of need for appointment prior to surgery.   Ava Boatman, NP  12/18/2023, 2:26 PM

## 2023-12-18 NOTE — Telephone Encounter (Signed)
 Pt scheduled for IN OFFICE PREOP CLEARANCE 01/06/24 with Katlyn West, NP

## 2023-12-18 NOTE — Telephone Encounter (Signed)
 [facility] Pre-operative Risk Assessment     Request for surgical clearance:     Endoscopy Procedure  What type of surgery is being performed?     EGD/Colonoscopy  When is this surgery scheduled?     TBD  What type of clearance is required ?   Cardiac clearance  Practice name and name of physician performing surgery?      [facility]  What is your office phone and fax number?      Phone- [phone]  Fax- [phone]  Anesthesia type (None, local, MAC, general) ?       MAC   Please route your response to Monita Anon, CMA

## 2023-12-25 ENCOUNTER — Other Ambulatory Visit: Payer: Self-pay | Admitting: Internal Medicine

## 2023-12-25 DIAGNOSIS — Z794 Long term (current) use of insulin: Secondary | ICD-10-CM

## 2023-12-30 ENCOUNTER — Other Ambulatory Visit: Payer: Self-pay | Admitting: Internal Medicine

## 2023-12-30 DIAGNOSIS — N184 Chronic kidney disease, stage 4 (severe): Secondary | ICD-10-CM | POA: Diagnosis not present

## 2023-12-30 DIAGNOSIS — D649 Anemia, unspecified: Secondary | ICD-10-CM | POA: Diagnosis not present

## 2023-12-30 DIAGNOSIS — I5020 Unspecified systolic (congestive) heart failure: Secondary | ICD-10-CM | POA: Diagnosis not present

## 2023-12-30 DIAGNOSIS — I129 Hypertensive chronic kidney disease with stage 1 through stage 4 chronic kidney disease, or unspecified chronic kidney disease: Secondary | ICD-10-CM | POA: Diagnosis not present

## 2023-12-30 DIAGNOSIS — R809 Proteinuria, unspecified: Secondary | ICD-10-CM | POA: Diagnosis not present

## 2023-12-30 DIAGNOSIS — E785 Hyperlipidemia, unspecified: Secondary | ICD-10-CM | POA: Diagnosis not present

## 2023-12-30 DIAGNOSIS — E1122 Type 2 diabetes mellitus with diabetic chronic kidney disease: Secondary | ICD-10-CM | POA: Diagnosis not present

## 2023-12-30 DIAGNOSIS — R110 Nausea: Secondary | ICD-10-CM

## 2023-12-30 NOTE — Telephone Encounter (Signed)
 Requested medication (s) are due for refill today - yes  Requested medication (s) are on the active medication list -yes  Future visit scheduled -yes  Last refill: 11/03/23 #30 1RF  Notes to clinic: non delegated Rx  Requested Prescriptions  Pending Prescriptions Disp Refills   ondansetron  (ZOFRAN ) 4 MG tablet [Pharmacy Med Name: ONDANSETRON  HCL 4 MG TABLET] 30 tablet 1    Sig: TAKE 1 TABLET (4 MG TOTAL) BY MOUTH DAILY AS NEEDED FOR NAUSEA OR VOMITING (AS NEEDED FOR NAUSEA).     Not Delegated - Gastroenterology: Antiemetics - ondansetron  Failed - 12/30/2023  9:22 AM      Failed - This refill cannot be delegated      Failed - Valid encounter within last 6 months    Recent Outpatient Visits           6 months ago Type 2 diabetes mellitus with diabetic polyneuropathy, with long-term current use of insulin  (HCC)   [place] [facility] [place]. [facility] Concetta Dee B, MD   1 year ago Type 2 diabetes mellitus with diabetic polyneuropathy, with long-term current use of insulin  [facility])   [place] [facility] Dept Of [place]. [facility] Concetta Dee B, MD   1 year ago Type 2 diabetes mellitus with diabetic polyneuropathy, with long-term current use of insulin  [facility])   [place] [facility] A Dept Of [place]. [facility] Lawrance Presume, MD   2 years ago Hospital discharge follow-up   [place] [facility] - A Dept Of [place]. [facility] Concetta Dee B, MD   2 years ago Type 2 diabetes mellitus with diabetic polyneuropathy, with long-term current use of insulin  [facility])   [place] [facility] A Dept Of [place]. [facility] Lawrance Presume, MD       Future Appointments             In 6 days Lawrance Presume, MD [facility] - A Dept Of [place]ag[place]. [facility]   In 1 week Chad, Katlyn D, NP [facility] at [facility] A  Dept of [employer]. [facility], H&[facility]            Passed - AST in normal range and within 360 days    AST  Date Value Ref Range Status  12/16/2023 23 0 - 37 U/L Final         Passed - ALT in normal range and within 360 days    ALT  Date Value Ref Range Status  12/16/2023 23 0 - 53 U/L Final            Requested Prescriptions  Pending Prescriptions Disp Refills   ondansetron  (ZOFRAN ) 4 MG tablet [Pharmacy Med Name: ONDANSETRON  HCL 4 MG TABLET] 30 tablet 1    Sig: TAKE 1 TABLET (4 MG TOTAL) BY MOUTH DAILY AS NEEDED FOR NAUSEA OR VOMITING (AS NEEDED FOR NAUSEA).     Not Delegated - Gastroenterology: Antiemetics - ondansetron  Failed - 12/30/2023  9:22 AM      Failed - This refill cannot be delegated      Failed - Valid encounter within last 6 months    Recent Outpatient Visits           6 months ago Type 2 diabetes mellitus with diabetic polyneuropathy, with long-term current use  of insulin  [facility])   [place] [facility] - [facility] Dept Of [place]. [facility] Concetta Dee B, MD   1 year ago Type 2 diabetes mellitus with diabetic polyneuropathy, with long-term current use of insulin  [facility])   [place] [facility] Dept Of [place]. [facility] Concetta Dee B, MD   1 year ago Type 2 diabetes mellitus with diabetic polyneuropathy, with long-term current use of insulin  [facility])   [place] [facility] Dept Of [place]. [facility] Lawrance Presume, MD   2 years ago Hospital discharge follow-up   [place] [facility] - A Dept Of [place]. [facility] Concetta Dee B, MD   2 years ago Type 2 diabetes mellitus with diabetic polyneuropathy, with long-term current use of insulin  [facility])   [place] [facility] A Dept Of [place]. [facility] Lawrance Presume, MD       Future Appointments             In 6 days Lawrance Presume, MD [facility] [place] - A  Dept Of [place]. [facility]   In 1 week Chad, Katlyn D, NP [facility] at [facility] A Dept of [employer]. [facility], H&V            Passed - AST in normal range and within 360 days    AST  Date Value Ref Range Status  12/16/2023 23 0 - 37 U/L Final         Passed - ALT in normal range and within 360 days    ALT  Date Value Ref Range Status  12/16/2023 23 0 - 53 U/L Final

## 2023-12-30 NOTE — Telephone Encounter (Signed)
 Copied from CRM [id number]. Topic: Clinical - Medication Refill >> Dec 30, 2023 11:38 AM Bearl Botts E wrote: Medication: Continuous Glucose Sensor (DEXCOM G6 SENSOR) MISC  Has the patient contacted their pharmacy? Yes (Agent: If no, request that the patient contact the pharmacy for the refill. If patient does not wish to contact the pharmacy document the reason why and proceed with request.) (Agent: If yes, when and what did the pharmacy advise?)  This is the patient's preferred pharmacy:  [facility] [phone] [facility] [place] - [address] [place]mi [place] [address] Phone: [phone] Fax: [phone]  Is this the correct pharmacy for this prescription? Yes If no, delete pharmacy and type the correct one.   Has the prescription been filled recently? Yes  Is the patient out of the medication? Yes  Has the patient been seen for an appointment in the last year OR does the patient have an upcoming appointment? Yes  Can we respond through MyChart? Yes  Agent: Please be advised that Rx refills may take up to 3 business days. We ask that you follow-up with your pharmacy.

## 2024-01-03 NOTE — Progress Notes (Signed)
 Cardiology Office Note    Date:  01/06/2024  ID:  Derek Lowery, Derek Lowery [birth year], MRN [id number] PCP:  Lawrance Presume, MD  Cardiologist:  Eilleen Grates, MD  Electrophysiologist:  None   Chief Complaint: Preoperative cardiac evaluation   History of Present Illness: .    Derek Lowery is a 68 y.o. male with visit-pertinent history of CAD s/p PCI to OM1 in 2013 with residual 60% the LAD, 60% D1 disease, HFpEF, COPD, hypertension, type 2 diabetes mellitus, HLD, RBBB/LPFB.   Patient with history of CAD in 2012 patient had a cardiac catheterization with 60% diagonal stenosis, distal LAD 60% stenosis, OM 2 with an occluded superior branch and inferior 90% branch stenosis.  He had a DES stenting to the OM 2 inferior branch.  His EF was well-preserved and he was managed medically otherwise.  In November 2021 he was hospitalized in setting of COVID.  In May 2022 he was hospitalized with respiratory failure and again in January 2023.  Patient had acute respiratory failure requiring a ventilator, he had pneumonia and pulmonary edema.  Echocardiogram on 09/08/2021 indicated LVEF of 30 to 35%, mild global hypokinesis with more severe hypokinesis in the inferior lateral, anterolateral and apical myocardium, grade 2 diastolic dysfunction, RV systolic function and size was normal, there is normal pulmonary artery systolic pressures, trivial mitral valve regurgitation with no evidence of mitral stenosis, aortic valve regurgitation was not visualized with no evidence of stenosis.  Patient's blood pressure was well-controlled on carvedilol , isosorbide  and hydralazine .  Patient was last seen in clinic on 01/06/2023 by Dr. Lavonne Prairie.  He remains stable from a cardiac standpoint, was getting around with a walker.  Patient denies any new symptoms such as chest discomfort, neck or arm discomfort.  He denied any increased shortness of breath, PND or orthopnea.  Was recommended that he have repeat echocardiogram  although it was noted that titration of GDMT was difficult given his CKD, he is now followed by nephrologist.  Today patient presents for preoperative cardiac evaluation. He reports that he is doing well. He denies chest pain, shortness of breath, lower extremity edema, orthopnea or pnd. Notes some occasional increased lower extremity edema, he has been told by his nephrologist that he can take an additional dose of torsemide .  He notes that typically he will have increased lower extremity edema if he consumes foods that are high in salt, notes that he does not always follow a low-sodium diet.  He reports that he had a sausage biscuit this morning before breakfast, he feels that this has resulted in increased edema and a slight elevation in his blood pressure.  Patient notes that his blood pressure is typically less than 130/80.  He plans to take an additional dose of torsemide  when he returns home today.  Patient denies any weight gain.  He denies any palpitations, presyncope or syncope.  Patient is able to achieve greater than 4 METS of activity.  Patient reports that he had a skin wound on his chest last year which is why he did not complete his echocardiogram.  Labwork independently reviewed: 12/16/2023: Sodium 144, potassium 4.6, creatinine 2.47,, AST 23, ALT 23, EGFR 26.61, hemoglobin 11.3, hematocrit 33.4 ROS: .   Today he denies chest pain, shortness of breath, fatigue, palpitations, melena, hematuria, hemoptysis, diaphoresis, weakness, presyncope, syncope, orthopnea, and PND.  All other systems are reviewed and otherwise negative. Studies Reviewed: Aaron Aas   EKG:  EKG is ordered today, personally reviewed, demonstrating  EKG Interpretation  Date/Time:  Wednesday Jan 06 2024 13:43:52 EDT Ventricular Rate:  54 PR Interval:  216 QRS Duration:  164 QT Interval:  480 QTC Calculation: 455 R Axis:   242  Text Interpretation: Sinus bradycardia with 1st degree A-V block Right bundle branch block Inferior  infarct , age undetermined When compared with ECG of 14-Sep-2021 17:56, Premature ventricular complexes are no longer Present Confirmed by Lilybelle Mayeda [phone]) on 01/06/2024 2:53:02 PM   CV Studies: Cardiac studies reviewed are outlined and summarized above. Otherwise please see EMR for full report. Cardiac Studies & Procedures   ______________________________________________________________________________________________     ECHOCARDIOGRAM  ECHOCARDIOGRAM COMPLETE 09/08/2021  Narrative ECHOCARDIOGRAM REPORT    Patient Name:   ROCIO WOLAK Date of Exam: 09/08/2021 Medical Rec #:  [id number]        Height:       70.0 in Accession #:    [id number]       Weight:       249.0 lb Date of Birth:  1956/06/27        BSA:          2.291 m Patient Age:    65 years         BP:           123/73 mmHg Patient Gender: M                HR:           75 bpm. Exam Location:  Inpatient  Procedure: 2D Echo, Cardiac Doppler, Color Doppler and Intracardiac Opacification Agent  Indications:    NSTEMI  History:        Patient has prior history of Echocardiogram examinations, most recent 12/25/2020. CAD and Previous Myocardial Infarction; Risk Factors:Hypertension and Dyslipidemia.  Sonographer:    Crissie Dome RDCS (AE) Referring Phys: [id number] Juliette Oh   Sonographer Comments: On BiPap. IMPRESSIONS   1. Mild global hypokinesis with more severe hypokinesis in the inferolateral, anterolateral, and apical myocardium. Left ventricular ejection fraction, by estimation, is 30 to 35%. The left ventricle has moderately decreased function. The left ventricle demonstrates regional wall motion abnormalities (see scoring diagram/findings for description). Left ventricular diastolic parameters are consistent with Grade II diastolic dysfunction (pseudonormalization). 2. Right ventricular systolic function is mildly reduced. The right ventricular size is normal. There is normal pulmonary artery systolic  pressure. 3. The mitral valve is normal in structure. Trivial mitral valve regurgitation. No evidence of mitral stenosis. 4. The aortic valve is tricuspid. Aortic valve regurgitation is not visualized. No aortic stenosis is present. 5. The inferior vena cava is dilated in size with >50% respiratory variability, suggesting right atrial pressure of 8 mmHg.  FINDINGS Left Ventricle: Mild global hypokinesis with more severe hypokinesis in the inferolateral, anterolateral, and apical myocardium. Left ventricular ejection fraction, by estimation, is 30 to 35%. The left ventricle has moderately decreased function. The left ventricle demonstrates regional wall motion abnormalities. The left ventricular internal cavity size was normal in size. There is no left ventricular hypertrophy. Left ventricular diastolic parameters are consistent with Grade II diastolic dysfunction (pseudonormalization).  Right Ventricle: The right ventricular size is normal. No increase in right ventricular wall thickness. Right ventricular systolic function is mildly reduced. There is normal pulmonary artery systolic pressure. The tricuspid regurgitant velocity is 2.28 m/s, and with an assumed right atrial pressure of 8 mmHg, the estimated right ventricular systolic pressure is 28.8 mmHg.  Left Atrium: Left atrial size was normal in size.  Right Atrium: Right  atrial size was normal in size.  Pericardium: There is no evidence of pericardial effusion.  Mitral Valve: The mitral valve is normal in structure. Trivial mitral valve regurgitation. No evidence of mitral valve stenosis. MV peak gradient, 5.1 mmHg. The mean mitral valve gradient is 2.0 mmHg.  Tricuspid Valve: The tricuspid valve is normal in structure. Tricuspid valve regurgitation is trivial. No evidence of tricuspid stenosis.  Aortic Valve: The aortic valve is tricuspid. Aortic valve regurgitation is not visualized. No aortic stenosis is present. Aortic valve mean  gradient measures 3.0 mmHg. Aortic valve peak gradient measures 4.7 mmHg. Aortic valve area, by VTI measures 2.07 cm.  Pulmonic Valve: The pulmonic valve was normal in structure. Pulmonic valve regurgitation is trivial. No evidence of pulmonic stenosis.  Aorta: The aortic root is normal in size and structure.  Venous: The inferior vena cava is dilated in size with greater than 50% respiratory variability, suggesting right atrial pressure of 8 mmHg.  IAS/Shunts: No atrial level shunt detected by color flow Doppler.   LEFT VENTRICLE PLAX 2D LVIDd:         5.40 cm   Diastology LVIDs:         4.40 cm   LV e' medial:    4.13 cm/s LV PW:         1.00 cm   LV E/e' medial:  27.8 LV IVS:        0.90 cm   LV e' lateral:   8.85 cm/s LVOT diam:     2.00 cm   LV E/e' lateral: 13.0 LV SV:         48 LV SV Index:   21 LVOT Area:     3.14 cm   RIGHT VENTRICLE            IVC RV Basal diam:  3.90 cm    IVC diam: 2.10 cm RV Mid diam:    3.70 cm RV S prime:     9.15 cm/s TAPSE (M-mode): 1.8 cm  LEFT ATRIUM             Index        RIGHT ATRIUM           Index LA diam:        3.80 cm 1.66 cm/m   RA Area:     17.00 cm LA Vol (A2C):   57.4 ml 25.05 ml/m  RA Volume:   47.10 ml  20.56 ml/m LA Vol (A4C):   53.0 ml 23.13 ml/m LA Biplane Vol: 58.6 ml 25.58 ml/m AORTIC VALVE AV Area (Vmax):    2.16 cm AV Area (Vmean):   2.00 cm AV Area (VTI):     2.07 cm AV Vmax:           108.00 cm/s AV Vmean:          74.100 cm/s AV VTI:            0.231 m AV Peak Grad:      4.7 mmHg AV Mean Grad:      3.0 mmHg LVOT Vmax:         74.30 cm/s LVOT Vmean:        47.200 cm/s LVOT VTI:          0.152 m LVOT/AV VTI ratio: 0.66  AORTA Ao Root diam: 3.00 cm Ao Asc diam:  2.90 cm  MITRAL VALVE                TRICUSPID VALVE MV  Area (PHT): 4.33 cm     TR Peak grad:   20.8 mmHg MV Area VTI:   1.52 cm     TR Vmax:        228.00 cm/s MV Peak grad:  5.1 mmHg MV Mean grad:  2.0 mmHg     SHUNTS MV Vmax:        1.13 m/s     Systemic VTI:  0.15 m MV Vmean:      59.5 cm/s    Systemic Diam: 2.00 cm MV Decel Time: 175 msec MV E velocity: 115.00 cm/s MV A velocity: 66.00 cm/s MV E/A ratio:  1.74  Maudine Sos MD Electronically signed by Maudine Sos MD Signature Date/Time: 09/08/2021/5:55:25 PM    Final          ______________________________________________________________________________________________     Current Reported Medications:.    Current Meds  Medication Sig   acetaminophen  (TYLENOL ) 325 MG tablet Take 2 tablets (650 mg total) by mouth every 6 (six) hours as needed for mild pain (or Fever >/= 101).   ammonium lactate  (AMLACTIN) 12 % lotion Apply to both feet twice daily for dry skin.   aspirin  EC 81 MG tablet Take 1 tablet (81 mg total) by mouth daily.   atorvastatin  (LIPITOR) 40 MG tablet Take 1 tablet (40 mg total) by mouth daily.   brimonidine  (ALPHAGAN ) 0.2 % ophthalmic solution INSTILL 1 DROP INTO RIGHT EYE TWICE A DAY   carvedilol  (COREG ) 12.5 MG tablet TAKE 1 TABLET (12.5MG  TOTAL) BY MOUTH TWICE A DAY WITH MEALS   Continuous Blood Gluc Receiver (DEXCOM G6 RECEIVER) DEVI Use to check blood sugar four times daily. E11.42   Continuous Glucose Sensor (DEXCOM G6 SENSOR) MISC USE TO CHECK BLOOD SUGAR FOUR TIMES DAILY. E11.42   Continuous Glucose Transmitter (DEXCOM G6 TRANSMITTER) MISC USE TO CHECK BLOOD SUGAR FOUR TIMES DAILY. E11.42   dorzolamide  (TRUSOPT ) 2 % ophthalmic solution Place 1 drop into the right eye 2 (two) times daily.   hydrALAZINE  (APRESOLINE ) 100 MG tablet Take 1 tablet (100 mg total) by mouth 3 (three) times daily.   insulin  glargine (LANTUS ) 100 UNIT/ML injection Inject 10 Units into the skin at bedtime.   insulin  lispro (HUMALOG  KWIKPEN) 100 UNIT/ML KwikPen INJECT 10 UNITS INTO THE SKIN 3 (THREE) TIMES DAILY.   Insulin  Pen Needle (BD PEN NEEDLE NANO 2ND GEN) 32G X 4 MM MISC USE AS DIRECTED 3 TIMES A DAY   isosorbide  mononitrate (IMDUR ) 120 MG  24 hr tablet TAKE 1 TABLET BY MOUTH EVERY DAY   Lancets MISC Use as directed.  Accu chek 2   latanoprost  (XALATAN ) 0.005 % ophthalmic solution Place 1 drop into the right eye at bedtime.   lisinopril  (ZESTRIL ) 20 MG tablet Take 20 mg by mouth daily.   Multiple Vitamins-Minerals (MULTIVITAMIN WITH MINERALS) tablet Take 1 tablet by mouth daily.   ondansetron  (ZOFRAN ) 4 MG tablet TAKE 1 TABLET (4 MG TOTAL) BY MOUTH DAILY AS NEEDED FOR NAUSEA OR VOMITING (AS NEEDED FOR NAUSEA).   RHOPRESSA  0.02 % SOLN Place 1 drop into the right eye at bedtime.   timolol  (TIMOPTIC ) 0.5 % ophthalmic solution Place 1 drop into the right eye 2 (two) times daily.   torsemide  (DEMADEX ) 20 MG tablet TAKE 2 TABLETS (40MG ) BY MOUTH IN THE MORNING AND 1 TABLET (20MG ) BY MOUTH IN THE EVENING.   traZODone  (DESYREL ) 50 MG tablet TAKE 1/2 TABLET BY MOUTH EVERY DAY AT BEDTIME   Physical Exam:    VS:  BP 138/84   Pulse 62   Ht 5\' 10"  (1.778 m)   Wt 214 lb 6.4 oz (97.3 kg)   SpO2 98%   BMI 30.76 kg/m    Wt Readings from Last 3 Encounters:  01/06/24 214 lb 6.4 oz (97.3 kg)  01/05/24 213 lb (96.6 kg)  12/16/23 213 lb 6.4 oz (96.8 kg)    GEN: Well nourished, well developed in no acute distress NECK: No JVD; No carotid bruits CARDIAC: RRR, no murmurs, rubs, gallops RESPIRATORY:  Clear to auscultation without rales, wheezing or rhonchi  ABDOMEN: Soft, non-tender, non-distended EXTREMITIES:  +1 ankle edema; No acute deformity     Asessement and Plan:.    Preoperative cardiac evaluation: Mr. Steve perioperative risk of a major cardiac event is 11% according to the Revised Cardiac Risk Index (RCRI).  Therefore, he is at high risk for perioperative complications.   His functional capacity is good at 6.05 METs according to the Duke Activity Status Index (DASI). Recommendations: Patient with increased risk for perioperative complications, it was recommended that he have an echocardiogram last year that was not completed.   Recommend prior to EGD/colonoscopy he have an echocardiogram, if echo is improved or stable he would be able to proceed with EGD/colonoscopy at acceptable risk.  After echocardiogram has been completed, this note will be addended with further recommendations.  Antiplatelet and/or Anticoagulation Recommendations:The patient should remain on Aspirin  without interruption.    CAD: Patient with history of CAD in 2012 patient had a cardiac catheterization with 60% diagonal stenosis, distal LAD 60% stenosis, OM 2 with an occluded superior branch and inferior 90% branch stenosis.  He had a DES stenting to the OM 2 inferior branch. Stable with no anginal symptoms. No indication for ischemic evaluation.  Heart healthy diet and regular cardiovascular exercise encouraged.  Continue aspirin  81 mg daily, Lipitor 40 mg daily, carvedilol  12.5 mg twice daily, hydralazine  100 mg 3 times daily, Imdur  120 mg daily, lisinopril  20 mg daily per nephrology, torsemide  20 mg twice daily per nephrology.  HFrEF: Echocardiogram on 09/08/2021 indicated LVEF of 30 to 35%, mild global hypokinesis with more severe hypokinesis in the inferior lateral, anterolateral and apical myocardium, grade 2 diastolic dysfunction, RV systolic function and size was normal, there is normal pulmonary artery systolic pressures, trivial mitral valve regurgitation with no evidence of mitral stenosis, aortic valve regurgitation was not visualized with no evidence of stenosis. GDMT limited by renal function. Continue carvedilol  12.5 mg twice daily, hydralazine  100 mg 3 times daily, Imdur  120 mg daily, lisinopril  20 mg daily per nephrology, torsemide  20 mg twice daily per nephrology.  Hypertension: Blood pressure today 138/84, notes he had salty foods yesterday and today, normally below 130/80 at home.  Patient reports that the last 2 days he has had a high sodium breakfast, intends to take an additional dose of torsemide  this afternoon.  Encouraged patient to  monitor his blood pressure at home and notify the office if consistently elevated above 130/80.  Continue current antihypertensive regimen.  CKD stage IV: Last creatinine 2.47, eGFR 26.61, followed by nephrology.   Hyperlipidemia: Last lipid profile on 10/24/2022 indicated total cholesterol 198, triglycerides 91, HDL 35 and LDL 45.  Check fasting lipid profile.  Continue atorvastatin  40 mg daily.  Type 2 diabetes mellitus: Last hemoglobin A1C 6.1.  Monitor managed per PCP.    Disposition: F/u with Dr. Lavonne Prairie in four months or sooner if needed.   Signed, Shaundra Fullam D Norma Ignasiak, NP

## 2024-01-05 ENCOUNTER — Ambulatory Visit: Attending: Internal Medicine | Admitting: Internal Medicine

## 2024-01-05 VITALS — BP 138/64 | HR 58 | Temp 97.8°F | Ht 70.0 in | Wt 213.0 lb

## 2024-01-05 DIAGNOSIS — E113511 Type 2 diabetes mellitus with proliferative diabetic retinopathy with macular edema, right eye: Secondary | ICD-10-CM

## 2024-01-05 DIAGNOSIS — E1159 Type 2 diabetes mellitus with other circulatory complications: Secondary | ICD-10-CM

## 2024-01-05 DIAGNOSIS — N184 Chronic kidney disease, stage 4 (severe): Secondary | ICD-10-CM | POA: Insufficient documentation

## 2024-01-05 DIAGNOSIS — D631 Anemia in chronic kidney disease: Secondary | ICD-10-CM

## 2024-01-05 DIAGNOSIS — I5032 Chronic diastolic (congestive) heart failure: Secondary | ICD-10-CM | POA: Diagnosis not present

## 2024-01-05 DIAGNOSIS — Z794 Long term (current) use of insulin: Secondary | ICD-10-CM

## 2024-01-05 DIAGNOSIS — I25118 Atherosclerotic heart disease of native coronary artery with other forms of angina pectoris: Secondary | ICD-10-CM

## 2024-01-05 DIAGNOSIS — E1142 Type 2 diabetes mellitus with diabetic polyneuropathy: Secondary | ICD-10-CM

## 2024-01-05 DIAGNOSIS — I129 Hypertensive chronic kidney disease with stage 1 through stage 4 chronic kidney disease, or unspecified chronic kidney disease: Secondary | ICD-10-CM | POA: Diagnosis not present

## 2024-01-05 LAB — POCT GLYCOSYLATED HEMOGLOBIN (HGB A1C): HbA1c, POC (controlled diabetic range): 6 % (ref 0.0–7.0)

## 2024-01-05 LAB — GLUCOSE, POCT (MANUAL RESULT ENTRY): POC Glucose: 128 mg/dL — AB (ref 70–99)

## 2024-01-05 MED ORDER — ATORVASTATIN CALCIUM 40 MG PO TABS: 40.0000 mg | ORAL_TABLET | Freq: Every day | ORAL | 1 refills | Status: DC

## 2024-01-05 MED ORDER — CARVEDILOL 12.5 MG PO TABS: ORAL_TABLET | ORAL | 1 refills | Status: DC

## 2024-01-05 MED ORDER — HYDRALAZINE HCL 100 MG PO TABS: 100.0000 mg | ORAL_TABLET | Freq: Three times a day (TID) | ORAL | 2 refills | Status: AC

## 2024-01-05 MED ORDER — TORSEMIDE 20 MG PO TABS: ORAL_TABLET | ORAL | 1 refills | Status: DC

## 2024-01-05 NOTE — Patient Instructions (Signed)
 VISIT SUMMARY:  You had a follow-up appointment to review your diabetes, hypertension, heart disease, and chronic kidney disease. We discussed your current medications, recent health events, and necessary follow-up appointments.  YOUR PLAN:  -HYPERTENSIVE HEART AND CHRONIC KIDNEY DISEASE WITH HEART FAILURE: You have heart disease and chronic kidney disease, which are being managed with medications. Your blood pressure has improved, and we aim to keep it at 130/80. Continue taking your current medications. If your swelling worsens, increase your torsemide  dose to two pills in the morning and one in the evening. Monitor your blood pressure at home and avoid high-salt foods.  -ANEMIA IN CHRONIC KIDNEY DISEASE: You have anemia related to your chronic kidney disease, which has shown recent improvement. Continue to monitor for easy bruising or bleeding, and we will repeat blood tests at your next visit to check your anemia and platelet count.  -TYPE 2 DIABETES MELLITUS WITH INSULIN  USE: Your diabetes is well-controlled with an A1c of 6.0. Continue using Lantus  insulin  at 10 units at night and Humalog  as needed for high blood sugar. Monitor your blood glucose levels with your Dexcom device and avoid eating bread.  -DIABETIC RETINOPATHY: You have diabetic retinopathy in your right eye and receive injections from Dr. Lydia Sams. Continue with these injections. We will request the report from your last eye exam with Dr. Keturah Peer and ensure you have an upcoming appointment scheduled with him.  -GENERAL HEALTH MAINTENANCE: You are due for a Medicare wellness visit and missed a recent podiatrist appointment. Please schedule your Medicare wellness visit over the phone and reschedule your podiatrist appointment.  INSTRUCTIONS:  Please attend your cardiology appointment to assess the safety of undergoing an endoscopy and colonoscopy. Schedule a follow-up visit with us  in four months or sooner if any problems arise.

## 2024-01-05 NOTE — Progress Notes (Signed)
 Patient ID: [name], male    DOB: 07/26/1956  MRN: [id number]  CC: Diabetes (Dm & HTN f/u./No questions / concerns)   Subjective: Derek Lowery is a 68 y.o. male who presents for chronic ds management. His concerns today include:  hx of DM type 2 with retinopathy(laser treatments by Dr. Seward Dao), peripheral neuropathy, and microalbuminuria, HTN, CAD with stent OM2 in 2012 (NSTEMI 08/2021, diastolic CHF (EF 95-62% 09/2021), glaucoma (blind LT eye), CKD 4, ACD, HL.   Discussed the use of AI scribe software for clinical note transcription with the patient, who gave verbal consent to proceed.  History of Present Illness Derek Lowery is a 68 year old male with diabetes, hypertension, heart disease, and congestive heart failure who presents for follow-up.  Diabetes is managed with Lantus  insulin  10 units at night and Humalog  as needed for blood sugar levels approaching or exceeding 200. He experienced one episode of hypoglycemia three to four weeks ago. Most blood sugar readings are hyperglycemic, with one instance over 300 after bread consumption, which also causes gastrointestinal upset and diarrhea. Has Dexcom with him. Looks like most of his readings are below 180.  He has seen GI and plan is for EGD and c-scope once he has seen cardiology.  Has upcoming appt with cardiology 01/06/24. Has ye exam with Dr. Lydia Sams 01/18/2024 for continued treatment of retinopathy of RT eye.   HTN/CAD/CHF: he takes carvedilol  12.5 mg twice daily, lisinopril  10 mg once daily, atorvastatin  40 mg daily, hydralazine  100 mg three times a day, isosorbide  120 mg once a day, and torsemide  20 mg twice a day (even though written as 40 mg a.m and 20 mg p.m). He denies chest pain or dyspnea but has peripheral edema around his ankles and feet.  CKD 4/ACD: Sore his nephrologist Dr. Higinio Love.  States he was told by her that he can take 40 mg of torsemide  in the mornings and 20 mg in the evenings whenever he has increased  swelling in his legs or ankles otherwise he should take 20 twice a day.  Most recent GFR was 26.  Recent CBC showed hemoglobin increased from 10.3 to 11.3. PLT count was mildly low at 146.  He reports easy bruising if he hits his hand against something.  He has had no bleeding.     Patient Active Problem List   Diagnosis Date Noted   CKD (chronic kidney disease) stage 4, GFR 15-29 ml/min (HCC) 01/05/2024   Chronic systolic HF (heart failure) (HCC) 01/04/2023   Leg swelling 01/09/2022   Coronary artery disease involving native coronary artery of native heart without angina pectoris 01/09/2022   Multifocal pneumonia 09/07/2021   Acute systolic CHF (congestive heart failure) (HCC) 09/07/2021   Chronic diastolic CHF (congestive heart failure) (HCC) 06/17/2021   Neovascular glaucoma, right eye, severe stage 06/13/2021   Acute exacerbation of CHF (congestive heart failure) (HCC) 12/24/2020   Pneumatouria 06/28/2020   Pneumonia due to COVID-19 virus 06/28/2020   Proliferative diabetic retinopathy of right eye with macular edema associated with type 2 diabetes mellitus (HCC) 11/30/2019   Rubeosis iridis of right eye 11/30/2019   Glaucoma with pupillary seclusion, unspecified laterality, severe stage 11/30/2019   Trigger middle finger of right hand 10/17/2019   Pain due to onychomycosis of toenails of both feet 08/07/2019   Educated about COVID-19 virus infection 12/02/2018   RBBB 12/02/2018   Benign paroxysmal positional vertigo 06/25/2018   Pre-ulcerative corn or callous 10/13/2017   Anemia 10/13/2017  Acute kidney injury superimposed on CKD (HCC)    Chronic diarrhea 06/09/2017   Microalbuminuria 03/07/2017   Dry skin 03/05/2017   Glaucoma 03/05/2017   Legally blind 03/05/2017   Retinopathy due to secondary diabetes (HCC) 02/06/2016   Diabetic polyneuropathy associated with type 2 diabetes mellitus (HCC) 01/12/2015   Fournier's gangrene into true pelvis s/p I&D 05/05/2014 05/05/2014    Type 2 diabetes mellitus with hyperlipidemia (HCC) 07/17/2011   NSTEMI (non-ST elevated myocardial infarction) (HCC) 07/17/2011   Essential hypertension 07/17/2011     Current Outpatient Medications on File Prior to Visit  Medication Sig Dispense Refill   acetaminophen  (TYLENOL ) 325 MG tablet Take 2 tablets (650 mg total) by mouth every 6 (six) hours as needed for mild pain (or Fever >/= 101).     ammonium lactate  (AMLACTIN) 12 % lotion Apply to both feet twice daily for dry skin. 400 g 6   aspirin  EC 81 MG tablet Take 1 tablet (81 mg total) by mouth daily. 30 tablet 4   brimonidine  (ALPHAGAN ) 0.2 % ophthalmic solution INSTILL 1 DROP INTO RIGHT EYE TWICE A DAY 15 mL 3   Continuous Blood Gluc Receiver (DEXCOM G6 RECEIVER) DEVI Use to check blood sugar four times daily. E11.42 1 each 0   Continuous Glucose Sensor (DEXCOM G6 SENSOR) MISC USE TO CHECK BLOOD SUGAR FOUR TIMES DAILY. E11.42 3 each 3   Continuous Glucose Transmitter (DEXCOM G6 TRANSMITTER) MISC USE TO CHECK BLOOD SUGAR FOUR TIMES DAILY. E11.42 1 each 2   dorzolamide  (TRUSOPT ) 2 % ophthalmic solution Place 1 drop into the right eye 2 (two) times daily.     insulin  glargine (LANTUS ) 100 UNIT/ML injection Inject 10 Units into the skin at bedtime.     insulin  lispro (HUMALOG  KWIKPEN) 100 UNIT/ML KwikPen INJECT 10 UNITS INTO THE SKIN 3 (THREE) TIMES DAILY. 15 mL 1   Insulin  Pen Needle (BD PEN NEEDLE NANO 2ND GEN) 32G X 4 MM MISC USE AS DIRECTED 3 TIMES A DAY 100 each 6   isosorbide  mononitrate (IMDUR ) 120 MG 24 hr tablet TAKE 1 TABLET BY MOUTH EVERY DAY 90 tablet 1   Lancets MISC Use as directed.  Accu chek 2 100 each 11   latanoprost  (XALATAN ) 0.005 % ophthalmic solution Place 1 drop into the right eye at bedtime.     lisinopril  (ZESTRIL ) 20 MG tablet Take 20 mg by mouth daily.     Multiple Vitamins-Minerals (MULTIVITAMIN WITH MINERALS) tablet Take 1 tablet by mouth daily.     ondansetron  (ZOFRAN ) 4 MG tablet TAKE 1 TABLET (4 MG TOTAL) BY  MOUTH DAILY AS NEEDED FOR NAUSEA OR VOMITING (AS NEEDED FOR NAUSEA). 30 tablet 0   RHOPRESSA  0.02 % SOLN Place 1 drop into the right eye at bedtime.     timolol  (TIMOPTIC ) 0.5 % ophthalmic solution Place 1 drop into the right eye 2 (two) times daily.     traZODone  (DESYREL ) 50 MG tablet TAKE 1/2 TABLET BY MOUTH EVERY DAY AT BEDTIME 45 tablet 0   No current facility-administered medications on file prior to visit.    Allergies  Allergen Reactions   Empagliflozin Other (See Comments)    Four gangrene    Social History   Socioeconomic History   Marital status: Single    Spouse name: Not on file   Number of children: Not on file   Years of education: Not on file   Highest education level: Not on file  Occupational History   Occupation: Janitor  Employer: [employer]  Tobacco Use   Smoking status: Never   Smokeless tobacco: Never  Vaping Use   Vaping status: Never Used  Substance and Sexual Activity   Alcohol use: No   Drug use: No   Sexual activity: Never  Other Topics Concern   Not on file  Social History Narrative   Works at [employer]   [phone] Susie Pavon   Social Drivers of Health   Financial Resource Strain: Low Risk  (11/07/2022)   Overall Financial Resource Strain (CARDIA)    Difficulty of Paying Living Expenses: Not hard at all  Food Insecurity: No Food Insecurity (11/07/2022)   Hunger Vital Sign    Worried About Running Out of Food in the Last Year: Never true    Ran Out of Food in the Last Year: Never true  Transportation Needs: No Transportation Needs (11/07/2022)   PRAPARE - Administrator, Civil Service (Medical): No    Lack of Transportation (Non-Medical): No  Physical Activity: Inactive (11/07/2022)   Exercise Vital Sign    Days of Exercise per Week: 0 days    Minutes of Exercise per Session: 0 min  Stress: No Stress Concern Present (11/07/2022)   [employer] of Occupational Health - Occupational Stress Questionnaire    Feeling of  Stress : Not at all  Social Connections: Socially Isolated (11/07/2022)   Social Connection and Isolation Panel [NHANES]    Frequency of Communication with Friends and Family: More than three times a week    Frequency of Social Gatherings with Friends and Family: Once a week    Attends Religious Services: Never    Database administrator or Organizations: No    Attends Banker Meetings: Never    Marital Status: Never married  Intimate Partner Violence: Not At Risk (05/05/2021)   Humiliation, Afraid, Rape, and Kick questionnaire    Fear of Current or Ex-Partner: No    Emotionally Abused: No    Physically Abused: No    Sexually Abused: No    Family History  Problem Relation Age of Onset   Coronary artery disease Father        Developed in his 55s   Kidney disease Father    Diabetes Father    Melanoma Father    Rectal cancer Father    Heart failure Mother    Hypertension Mother    Diabetes Sister    Diabetes Brother    Colon cancer Neg Hx    Esophageal cancer Neg Hx    Stomach cancer Neg Hx     Past Surgical History:  Procedure Laterality Date   CATARACT EXTRACTION Right 2019   Dr. Candi Chafe   CHOLECYSTECTOMY N/A 09/21/2017   Procedure: LAPAROSCOPIC CHOLECYSTECTOMY WITH INTRAOPERATIVE CHOLANGIOGRAM;  Surgeon: Candyce Champagne, MD;  Location: [facility];  Service: General;  Laterality: N/A;   COLONOSCOPY  2000   Dr. Tova Fresh   EYE SURGERY     IRRIGATION AND DEBRIDEMENT ABSCESS Left 05/05/2014   Procedure: IRRIGATION AND DEBRIDEMENT ABSCESS left buttock;  Surgeon: Candyce Champagne, MD;  Location: [facility];  Service: General;  Laterality: Left;   LEFT HEART CATHETERIZATION WITH CORONARY ANGIOGRAM N/A 07/18/2011   Procedure: LEFT HEART CATHETERIZATION WITH CORONARY ANGIOGRAM;  Surgeon: Kristopher Pheasant, MD;  [facility];  Service: Cardiovascular;  Laterality: N/A;   PERCUTANEOUS CORONARY STENT INTERVENTION (PCI-S)  07/18/2011   Procedure: PERCUTANEOUS CORONARY STENT INTERVENTION  (PCI-S);  Surgeon: Kristopher Pheasant, MD;  Location: [facility] CATH LAB;  Service: Cardiovascular;;    ROS: Review of Systems Negative except as stated above  PHYSICAL EXAM: BP 138/64   Pulse (!) 58   Temp 97.8 F (36.6 C) (Oral)   Ht 5\' 10"  (1.778 m)   Wt 213 lb (96.6 kg)   SpO2 97%   BMI 30.56 kg/m   Physical Exam General appearance -Caucasian male who looks older than stated age.  He appears slightly unkept.  He ambulates with standard walker. Mental status - normal mood, behavior, speech, dress, motor activity, and thought processes Neck - supple, no significant adenopathy Chest - clear to auscultation, no wheezes, rales or rhonchi, symmetric air entry Heart - normal rate, regular rhythm, normal S1, S2, no murmurs, rubs, clicks or gallops Extremities -1+ bilateral ankle edema  Results for orders placed or performed in visit on 01/05/24  POCT glucose (manual entry)   Collection Time: 01/05/24  2:30 PM  Result Value Ref Range   POC Glucose 128 (A) 70 - 99 mg/dl  POCT glycosylated hemoglobin (Hb A1C)   Collection Time: 01/05/24  2:53 PM  Result Value Ref Range   Hemoglobin A1C     HbA1c POC (<> result, manual entry)     HbA1c, POC (prediabetic range)     HbA1c, POC (controlled diabetic range) 6.0 0.0 - 7.0 %       Latest Ref Rng & Units 12/16/2023   10:08 AM 10/24/2022    3:45 PM 01/10/2022   10:14 AM  CMP  Glucose 70 - 99 mg/dL 409   811   BUN 6 - 23 mg/dL 60   49   Creatinine 9.14 - 1.50 mg/dL 7.82   9.56   Sodium 213 - 145 mEq/L 144   145   Potassium 3.5 - 5.1 mEq/L 4.6   3.9   Chloride 96 - 112 mEq/L 110   103   CO2 19 - 32 mEq/L 25   31   Calcium  8.4 - 10.5 mg/dL 8.9   9.1   Total Protein 6.0 - 8.3 g/dL 6.5  6.1    Total Bilirubin 0.2 - 1.2 mg/dL 0.6  0.4    Alkaline Phos 39 - 117 U/L 47  66    AST 0 - 37 U/L 23  24    ALT 0 - 53 U/L 23  22     Lipid Panel     Component Value Date/Time   CHOL 98 (L) 10/24/2022 1545   TRIG 91 10/24/2022 1545   HDL 35 (L)  10/24/2022 1545   CHOLHDL 2.8 10/24/2022 1545   CHOLHDL 2.9 09/08/2021 0500   VLDL 8 09/08/2021 0500   LDLCALC 45 10/24/2022 1545    CBC    Component Value Date/Time   WBC 7.3 12/16/2023 1008   RBC 3.65 (L) 12/16/2023 1008   HGB 11.3 (L) 12/16/2023 1008   HGB 10.3 (L) 10/18/2021 1154   HCT 33.4 (L) 12/16/2023 1008   HCT 30.7 (L) 10/18/2021 1154   PLT 146.0 (L) 12/16/2023 1008   PLT 150 10/18/2021 1154   MCV 91.3 12/16/2023 1008   MCV 86 10/18/2021 1154   MCH 28.9 10/18/2021 1154   MCH 28.2 09/14/2021 0315   MCHC 33.7 12/16/2023 1008   RDW 13.7 12/16/2023 1008   RDW 14.5 10/18/2021 1154   LYMPHSABS 1.8 12/16/2023 1008   LYMPHSABS 1.8 07/12/2019 1042   MONOABS 0.9 12/16/2023 1008   EOSABS 0.4 12/16/2023 1008   EOSABS 0.3 07/12/2019 1042   BASOSABS 0.1 12/16/2023 1008  BASOSABS 0.1 07/12/2019 1042    ASSESSMENT AND PLAN: 1. Type 2 diabetes mellitus with diabetic polyneuropathy, with long-term current use of insulin  (HCC) (Primary) A1c is at goal.  He will continue Lantus  insulin  10 units daily and Humalog  as needed.  Discussed and encourage healthy eating habits.  Advised to switch to whole-grain wheat bread.  We will request copy of last eye exam done by his regular ophthalmologist Dr. Candi Chafe - POCT glycosylated hemoglobin (Hb A1C) - POCT glucose (manual entry)  2. Proliferative diabetic retinopathy of right eye with macular edema associated with type 2 diabetes mellitus (HCC) Currently being treated by retina specialist Dr. Lydia Sams and has upcoming appointment next month  3. Hypertension associated with diabetes (HCC) Close to goal on repeat.  He has not taken his afternoon dose of hydralazine  as yet so I have not made any changes in his medications.  He will continue the carvedilol  12.5 mg twice a day, hydralazine  100 mg 3 times a day, isosorbide  120 mg daily, Lisinopril  20 mg daily and Torsemide  - carvedilol  (COREG ) 12.5 MG tablet; TAKE 1 TABLET (12.5MG  TOTAL) BY MOUTH  TWICE A DAY WITH MEALS  Dispense: 180 tablet; Refill: 1 - hydrALAZINE  (APRESOLINE ) 100 MG tablet; Take 1 tablet (100 mg total) by mouth 3 (three) times daily.  Dispense: 270 tablet; Refill: 2  4. Chronic diastolic CHF (congestive heart failure) (HCC) Patient with some edema at this time but lungs are clear.  Encouraged low-salt diet.  Advised him to take an extra dose of torsemide  today then resume taking 20 mg twice a day.  I have written the prescription as 40 mg in the morning and 20 mg in the evening just so that he would have extra pills to take when needed - torsemide  (DEMADEX ) 20 MG tablet; TAKE 2 TABLETS (40MG ) BY MOUTH IN THE MORNING AND 1 TABLET (20MG ) BY MOUTH IN THE EVENING.  Dispense: 270 tablet; Refill: 1  5. CKD (chronic kidney disease) stage 4, GFR 15-29 ml/min (HCC) Stable. Follow by nephrologist  6. Coronary artery disease of native artery of native heart with stable angina pectoris (HCC) Stable. Continue Coreg , Lipitor, ASA and Isosorbide  - atorvastatin  (LIPITOR) 40 MG tablet; Take 1 tablet (40 mg total) by mouth daily.  Dispense: 90 tablet; Refill: 1 - carvedilol  (COREG ) 12.5 MG tablet; TAKE 1 TABLET (12.5MG  TOTAL) BY MOUTH TWICE A DAY WITH MEALS  Dispense: 180 tablet; Refill: 1  7. Anemia due to stage 4 chronic kidney disease (HCC) stable    Patient was given the opportunity to ask questions.  Patient verbalized understanding of the plan and was able to repeat key elements of the plan.   This documentation was completed using Paediatric nurse.  Any transcriptional errors are unintentional.  Orders Placed This Encounter  Procedures   POCT glycosylated hemoglobin (Hb A1C)   POCT glucose (manual entry)     Requested Prescriptions   Signed Prescriptions Disp Refills   atorvastatin  (LIPITOR) 40 MG tablet 90 tablet 1    Sig: Take 1 tablet (40 mg total) by mouth daily.   carvedilol  (COREG ) 12.5 MG tablet 180 tablet 1    Sig: TAKE 1 TABLET (12.5MG   TOTAL) BY MOUTH TWICE A DAY WITH MEALS   hydrALAZINE  (APRESOLINE ) 100 MG tablet 270 tablet 2    Sig: Take 1 tablet (100 mg total) by mouth 3 (three) times daily.   torsemide  (DEMADEX ) 20 MG tablet 270 tablet 1    Sig: TAKE 2 TABLETS (40MG ) BY MOUTH  IN THE MORNING AND 1 TABLET (20MG ) BY MOUTH IN THE EVENING.    Return in about 4 months (around 05/07/2024) for Medicare Wellness Visit in 1-6   wks with CMA/LPN.  Concetta Dee, MD, FACP

## 2024-01-06 ENCOUNTER — Encounter: Payer: Self-pay | Admitting: Cardiology

## 2024-01-06 ENCOUNTER — Ambulatory Visit: Attending: Cardiology | Admitting: Cardiology

## 2024-01-06 ENCOUNTER — Encounter: Payer: Self-pay | Admitting: Internal Medicine

## 2024-01-06 VITALS — BP 138/84 | HR 62 | Ht 70.0 in | Wt 214.4 lb

## 2024-01-06 DIAGNOSIS — E1169 Type 2 diabetes mellitus with other specified complication: Secondary | ICD-10-CM

## 2024-01-06 DIAGNOSIS — I10 Essential (primary) hypertension: Secondary | ICD-10-CM | POA: Diagnosis not present

## 2024-01-06 DIAGNOSIS — Z01810 Encounter for preprocedural cardiovascular examination: Secondary | ICD-10-CM | POA: Diagnosis not present

## 2024-01-06 DIAGNOSIS — I2510 Atherosclerotic heart disease of native coronary artery without angina pectoris: Secondary | ICD-10-CM

## 2024-01-06 DIAGNOSIS — E118 Type 2 diabetes mellitus with unspecified complications: Secondary | ICD-10-CM

## 2024-01-06 DIAGNOSIS — I5022 Chronic systolic (congestive) heart failure: Secondary | ICD-10-CM

## 2024-01-06 DIAGNOSIS — N184 Chronic kidney disease, stage 4 (severe): Secondary | ICD-10-CM | POA: Diagnosis not present

## 2024-01-06 DIAGNOSIS — E785 Hyperlipidemia, unspecified: Secondary | ICD-10-CM

## 2024-01-06 NOTE — Patient Instructions (Signed)
 Medication Instructions:  No changes *If you need a refill on your cardiac medications before your next appointment, please call your pharmacy*  Lab Work: At your convenience we are going to have you get fasting lipid panel If you have labs (blood work) drawn today and your tests are completely normal, you will receive your results only by: MyChart Message (if you have MyChart) OR A paper copy in the mail If you have any lab test that is abnormal or we need to change your treatment, we will call you to review the results.  Testing/Procedures: Patient can be scheduled in either  or at [facility] [employer] physician has requested that you have an echocardiogram. Echocardiography is a painless test that uses sound waves to create images of your heart. It provides your doctor with information about the size and shape of your heart and how well your heart's chambers and valves are working. This procedure takes approximately one hour. There are no restrictions for this procedure. Please do NOT wear cologne, perfume, aftershave, or lotions (deodorant is allowed). Please arrive 15 minutes prior to your appointment time.  Please note: We ask at that you not bring children with you during ultrasound (echo/ vascular) testing. Due to room size and safety concerns, children are not allowed in the ultrasound rooms during exams. Our front office staff cannot provide observation of children in our lobby area while testing is being conducted. An adult accompanying a patient to their appointment will only be allowed in the ultrasound room at the discretion of the ultrasound technician under special circumstances. We apologize for any inconvenience.   Follow-Up: At [facility], you and your health needs are our priority.  As part of our continuing mission to provide you with exceptional heart care, our providers are all part of one team.  This team includes your primary Cardiologist (physician)  and Advanced Practice Providers or APPs (Physician Assistants and Nurse Practitioners) who all work together to provide you with the care you need, when you need it.  Your next appointment:   4 month(s)  Provider:   Eilleen Grates, MD    We recommend signing up for the patient portal called "MyChart".  Sign up information is provided on this After Visit Summary.  MyChart is used to connect with patients for Virtual Visits (Telemedicine).  Patients are able to view lab/test results, encounter notes, upcoming appointments, etc.  Non-urgent messages can be sent to your provider as well.   To learn more about what you can do with MyChart, go to [email or web address].

## 2024-01-09 ENCOUNTER — Other Ambulatory Visit: Payer: Self-pay | Admitting: Internal Medicine

## 2024-01-09 DIAGNOSIS — E1159 Type 2 diabetes mellitus with other circulatory complications: Secondary | ICD-10-CM

## 2024-01-09 DIAGNOSIS — I25118 Atherosclerotic heart disease of native coronary artery with other forms of angina pectoris: Secondary | ICD-10-CM

## 2024-01-18 DIAGNOSIS — E113511 Type 2 diabetes mellitus with proliferative diabetic retinopathy with macular edema, right eye: Secondary | ICD-10-CM | POA: Diagnosis not present

## 2024-01-27 ENCOUNTER — Other Ambulatory Visit: Payer: Self-pay | Admitting: Internal Medicine

## 2024-01-27 DIAGNOSIS — R110 Nausea: Secondary | ICD-10-CM

## 2024-02-15 ENCOUNTER — Encounter (HOSPITAL_COMMUNITY): Payer: Self-pay | Admitting: Cardiology

## 2024-02-15 ENCOUNTER — Ambulatory Visit (HOSPITAL_COMMUNITY): Admission: RE | Admit: 2024-02-15 | Source: Ambulatory Visit | Attending: Cardiology | Admitting: Cardiology

## 2024-02-15 ENCOUNTER — Ambulatory Visit: Admitting: Internal Medicine

## 2024-02-24 DIAGNOSIS — E113511 Type 2 diabetes mellitus with proliferative diabetic retinopathy with macular edema, right eye: Secondary | ICD-10-CM | POA: Diagnosis not present

## 2024-02-26 ENCOUNTER — Other Ambulatory Visit: Payer: Self-pay | Admitting: Internal Medicine

## 2024-02-26 DIAGNOSIS — R110 Nausea: Secondary | ICD-10-CM

## 2024-03-11 DIAGNOSIS — Z961 Presence of intraocular lens: Secondary | ICD-10-CM | POA: Diagnosis not present

## 2024-03-11 DIAGNOSIS — Z794 Long term (current) use of insulin: Secondary | ICD-10-CM | POA: Diagnosis not present

## 2024-03-11 DIAGNOSIS — H4089 Other specified glaucoma: Secondary | ICD-10-CM | POA: Diagnosis not present

## 2024-03-11 DIAGNOSIS — E113553 Type 2 diabetes mellitus with stable proliferative diabetic retinopathy, bilateral: Secondary | ICD-10-CM | POA: Diagnosis not present

## 2024-03-11 DIAGNOSIS — H4052X3 Glaucoma secondary to other eye disorders, left eye, severe stage: Secondary | ICD-10-CM | POA: Diagnosis not present

## 2024-03-17 ENCOUNTER — Encounter (HOSPITAL_COMMUNITY): Payer: Self-pay | Admitting: Cardiology

## 2024-03-17 ENCOUNTER — Other Ambulatory Visit (HOSPITAL_COMMUNITY): Payer: Self-pay | Admitting: Cardiology

## 2024-03-17 ENCOUNTER — Ambulatory Visit (HOSPITAL_COMMUNITY): Admission: RE | Admit: 2024-03-17 | Source: Ambulatory Visit

## 2024-03-17 DIAGNOSIS — I5020 Unspecified systolic (congestive) heart failure: Secondary | ICD-10-CM

## 2024-03-23 ENCOUNTER — Encounter: Payer: Self-pay | Admitting: *Deleted

## 2024-03-24 DIAGNOSIS — E113511 Type 2 diabetes mellitus with proliferative diabetic retinopathy with macular edema, right eye: Secondary | ICD-10-CM | POA: Diagnosis not present

## 2024-03-27 ENCOUNTER — Other Ambulatory Visit: Payer: Self-pay | Admitting: Internal Medicine

## 2024-03-27 DIAGNOSIS — R110 Nausea: Secondary | ICD-10-CM

## 2024-04-08 DIAGNOSIS — E1142 Type 2 diabetes mellitus with diabetic polyneuropathy: Secondary | ICD-10-CM | POA: Diagnosis not present

## 2024-04-08 DIAGNOSIS — M79674 Pain in right toe(s): Secondary | ICD-10-CM | POA: Diagnosis not present

## 2024-04-08 DIAGNOSIS — L853 Xerosis cutis: Secondary | ICD-10-CM | POA: Diagnosis not present

## 2024-04-08 DIAGNOSIS — M79675 Pain in left toe(s): Secondary | ICD-10-CM | POA: Diagnosis not present

## 2024-04-08 DIAGNOSIS — B351 Tinea unguium: Secondary | ICD-10-CM | POA: Diagnosis not present

## 2024-04-08 DIAGNOSIS — M792 Neuralgia and neuritis, unspecified: Secondary | ICD-10-CM | POA: Diagnosis not present

## 2024-04-21 DIAGNOSIS — H5712 Ocular pain, left eye: Secondary | ICD-10-CM | POA: Diagnosis not present

## 2024-04-21 DIAGNOSIS — H44112 Panuveitis, left eye: Secondary | ICD-10-CM | POA: Diagnosis not present

## 2024-04-21 DIAGNOSIS — H4089 Other specified glaucoma: Secondary | ICD-10-CM | POA: Diagnosis not present

## 2024-04-21 DIAGNOSIS — H11432 Conjunctival hyperemia, left eye: Secondary | ICD-10-CM | POA: Diagnosis not present

## 2024-04-21 DIAGNOSIS — E113511 Type 2 diabetes mellitus with proliferative diabetic retinopathy with macular edema, right eye: Secondary | ICD-10-CM | POA: Diagnosis not present

## 2024-05-05 DIAGNOSIS — E785 Hyperlipidemia, unspecified: Secondary | ICD-10-CM | POA: Diagnosis not present

## 2024-05-05 DIAGNOSIS — I129 Hypertensive chronic kidney disease with stage 1 through stage 4 chronic kidney disease, or unspecified chronic kidney disease: Secondary | ICD-10-CM | POA: Diagnosis not present

## 2024-05-05 DIAGNOSIS — N184 Chronic kidney disease, stage 4 (severe): Secondary | ICD-10-CM | POA: Diagnosis not present

## 2024-05-05 DIAGNOSIS — I5020 Unspecified systolic (congestive) heart failure: Secondary | ICD-10-CM | POA: Diagnosis not present

## 2024-05-05 DIAGNOSIS — R809 Proteinuria, unspecified: Secondary | ICD-10-CM | POA: Diagnosis not present

## 2024-05-05 DIAGNOSIS — E1122 Type 2 diabetes mellitus with diabetic chronic kidney disease: Secondary | ICD-10-CM | POA: Diagnosis not present

## 2024-05-05 DIAGNOSIS — D649 Anemia, unspecified: Secondary | ICD-10-CM | POA: Diagnosis not present

## 2024-05-06 ENCOUNTER — Telehealth: Payer: Self-pay | Admitting: Internal Medicine

## 2024-05-06 ENCOUNTER — Other Ambulatory Visit: Payer: Self-pay | Admitting: Internal Medicine

## 2024-05-06 DIAGNOSIS — Z794 Long term (current) use of insulin: Secondary | ICD-10-CM

## 2024-05-06 LAB — LAB REPORT - SCANNED: EGFR: 29

## 2024-05-06 MED ORDER — DEXCOM G6 SENSOR MISC: 3 refills | Status: AC

## 2024-05-06 NOTE — Telephone Encounter (Signed)
 Confirmed appt foir 9/22

## 2024-05-09 ENCOUNTER — Ambulatory Visit: Attending: Internal Medicine | Admitting: Internal Medicine

## 2024-05-09 ENCOUNTER — Encounter: Payer: Self-pay | Admitting: Internal Medicine

## 2024-05-09 VITALS — BP 171/80 | HR 54 | Temp 97.8°F | Ht 70.0 in | Wt 226.0 lb

## 2024-05-09 DIAGNOSIS — D631 Anemia in chronic kidney disease: Secondary | ICD-10-CM

## 2024-05-09 DIAGNOSIS — Z794 Long term (current) use of insulin: Secondary | ICD-10-CM

## 2024-05-09 DIAGNOSIS — E785 Hyperlipidemia, unspecified: Secondary | ICD-10-CM | POA: Diagnosis not present

## 2024-05-09 DIAGNOSIS — E1169 Type 2 diabetes mellitus with other specified complication: Secondary | ICD-10-CM

## 2024-05-09 DIAGNOSIS — E1159 Type 2 diabetes mellitus with other circulatory complications: Secondary | ICD-10-CM | POA: Diagnosis not present

## 2024-05-09 DIAGNOSIS — I5042 Chronic combined systolic (congestive) and diastolic (congestive) heart failure: Secondary | ICD-10-CM | POA: Diagnosis not present

## 2024-05-09 DIAGNOSIS — N184 Chronic kidney disease, stage 4 (severe): Secondary | ICD-10-CM

## 2024-05-09 DIAGNOSIS — I152 Hypertension secondary to endocrine disorders: Secondary | ICD-10-CM

## 2024-05-09 DIAGNOSIS — E1142 Type 2 diabetes mellitus with diabetic polyneuropathy: Secondary | ICD-10-CM

## 2024-05-09 DIAGNOSIS — Z2821 Immunization not carried out because of patient refusal: Secondary | ICD-10-CM

## 2024-05-09 LAB — POCT GLYCOSYLATED HEMOGLOBIN (HGB A1C): HbA1c, POC (controlled diabetic range): 5.6 % (ref 0.0–7.0)

## 2024-05-09 LAB — GLUCOSE, POCT (MANUAL RESULT ENTRY): POC Glucose: 132 mg/dL — AB (ref 70–99)

## 2024-05-09 MED ORDER — TORSEMIDE 20 MG PO TABS: ORAL_TABLET | ORAL | 1 refills | Status: DC

## 2024-05-09 MED ORDER — INSULIN GLARGINE 100 UNIT/ML ~~LOC~~ SOLN: SUBCUTANEOUS | 6 refills | Status: AC

## 2024-05-09 NOTE — Progress Notes (Signed)
 Patient ID: RAI SEVERNS, male    DOB: 1956-01-13  MRN: [id number]  CC: Diabetes (DM f/u./No questions / concerns/No to flu vax)   Subjective: Derek Lowery is a 68 y.o. male who presents for chronic ds management. His concerns today include:   hx of DM type 2 with retinopathy(laser treatments by Dr. Elner), peripheral neuropathy, and microalbuminuria, HTN, CAD with stent OM2 in 2012 (NSTEMI 08/2021, systolic and diastolic CHF (EF 69-64% 09/2021), glaucoma (blind LT eye), CKD 4, ACD, HL, brought walker to visit today   CAD with stent OM2 in 2012 (NSTEMI 08/2021, systolic and diastolic CHF (EF 69-64% 09/2021) ACD: Takes torsemide  20 mg BID. Sometimes he feels lightheaded when he stands up too quickly, though this isn't often. Has had more swelling in his legs recently. Denies orthopnea. Cardiologist ordered repeat echo twice which he has not done yet. He has missed these appointment due to transportation issues and sleeping in.  T2DM w/ peripheral neuropathy, glaucoma, and microalbuminuria A1C today: 5.6. POCT glucose was 132  Has a CGM but has not had it in a few days and did not bring it. BMI 32. Gained 12 pounds since last visit in 12/2023. Meds: Lantus  10 U at bedtime, Humalog  10 U TID with meals PRN, usually takes once at supper. The patient confirms good adherence to these diabetes medications.  The patient is checking blood sugars at home, with values ranging from 90s before meals and 130s after meals.  Diet: is now cutting out white foods like oats, bread. Sometimes eats hamburger meat. Only has diet sugar drinks. Is not exercising The patient reports episodes of hypoglycemia: 1-2x a month. Has lightheadedness and eats a candy bar when it happen which improves sx.   Results for orders placed or performed in visit on 05/09/24  POCT glucose (manual entry)   Collection Time: 05/09/24 11:21 AM  Result Value Ref Range   POC Glucose 132 (A) 70 - 99 mg/dl  POCT glycosylated  hemoglobin (Hb A1C)   Collection Time: 05/09/24 11:24 AM  Result Value Ref Range   Hemoglobin A1C     HbA1c POC (<> result, manual entry)     HbA1c, POC (prediabetic range)     HbA1c, POC (controlled diabetic range) 5.6 0.0 - 7.0 %    HTN:  BP 172/71 initial; 171/80 on recheck.  Reports BP was 122/58 at kidney doctor a few days ago. Had 3 cups of coffee this morning as gabapentin  made him drowsy, but  heusually only has 1-2 cups. Is taking coreg  12.5 mg BID with meals, hydralazine  100 mg TID, lisinopril  20 mg daily, and imdur  120 mg daily.  The patient took his BP pills this morning. Reports good compliance with meds. Checks BP at home and reports it runs in good ranges but cannot remember specific numbers. The patient has been eating more salt lately due to cramps in his legs at night, was apparently told by kidney specialists that he had electrolyte derangements. He has had more leg swelling recently since increasing salt this week. Denies SOB, CP, HA.  Dry, flaky legs: Has not been applying lotion to legs. Denies any bruising or ulcers. Continues to follow with podiatry to get his toe nails cut.  Tobacco/alcohol: Denies use  CKD4: GFR 26 in 11/2023. Patient continues to make urine. Is avoiding NSAIDs. Has leg swelling. Last saw nephrology on 05/04/2024, with no changes to meds. Reports he got labs done this week with kidney doctor, who will send us   the records.   Anemia due to CKD: 11/2023 Hg 11.3. Stable.   HL: Continues atorvastatin  40 mg daily   Ambulation: Denies falls. Reports use of walker outside but uses cane at home for ambulation. Has walker with him at visit today  HM:  Declined Influenza Vaccine   Patient Active Problem List   Diagnosis Date Noted   CKD (chronic kidney disease) stage 4, GFR 15-29 ml/min (HCC) 01/05/2024   Chronic systolic HF (heart failure) (HCC) 01/04/2023   Leg swelling 01/09/2022   Coronary artery disease involving native coronary artery of native  heart without angina pectoris 01/09/2022   Multifocal pneumonia 09/07/2021   Acute systolic CHF (congestive heart failure) (HCC) 09/07/2021   Chronic diastolic CHF (congestive heart failure) (HCC) 06/17/2021   Neovascular glaucoma, right eye, severe stage 06/13/2021   Acute exacerbation of CHF (congestive heart failure) (HCC) 12/24/2020   Pneumatouria 06/28/2020   Pneumonia due to COVID-19 virus 06/28/2020   Proliferative diabetic retinopathy of right eye with macular edema associated with type 2 diabetes mellitus (HCC) 11/30/2019   Rubeosis iridis of right eye 11/30/2019   Glaucoma with pupillary seclusion, unspecified laterality, severe stage 11/30/2019   Trigger middle finger of right hand 10/17/2019   Pain due to onychomycosis of toenails of both feet 08/07/2019   Educated about COVID-19 virus infection 12/02/2018   RBBB 12/02/2018   Benign paroxysmal positional vertigo 06/25/2018   Pre-ulcerative corn or callous 10/13/2017   Anemia 10/13/2017   Acute kidney injury superimposed on CKD (HCC)    Chronic diarrhea 06/09/2017   Microalbuminuria 03/07/2017   Dry skin 03/05/2017   Glaucoma 03/05/2017   Legally blind 03/05/2017   Retinopathy due to secondary diabetes (HCC) 02/06/2016   Diabetic polyneuropathy associated with type 2 diabetes mellitus (HCC) 01/12/2015   Fournier's gangrene into true pelvis s/p I&D 05/05/2014 05/05/2014   Type 2 diabetes mellitus with hyperlipidemia (HCC) 07/17/2011   NSTEMI (non-ST elevated myocardial infarction) (HCC) 07/17/2011   Essential hypertension 07/17/2011     Current Outpatient Medications on File Prior to Visit  Medication Sig Dispense Refill   acetaminophen  (TYLENOL ) 325 MG tablet Take 2 tablets (650 mg total) by mouth every 6 (six) hours as needed for mild pain (or Fever >/= 101).     ammonium lactate  (AMLACTIN) 12 % lotion Apply to both feet twice daily for dry skin. 400 g 6   aspirin  EC 81 MG tablet Take 1 tablet (81 mg total) by mouth  daily. 30 tablet 4   atorvastatin  (LIPITOR) 40 MG tablet Take 1 tablet (40 mg total) by mouth daily. 90 tablet 1   brimonidine  (ALPHAGAN ) 0.2 % ophthalmic solution INSTILL 1 DROP INTO RIGHT EYE TWICE A DAY 15 mL 3   carvedilol  (COREG ) 12.5 MG tablet TAKE 1 TABLET (12.5MG  TOTAL) BY MOUTH TWICE A DAY WITH MEALS 180 tablet 1   Continuous Blood Gluc Receiver (DEXCOM G6 RECEIVER) DEVI Use to check blood sugar four times daily. E11.42 1 each 0   Continuous Glucose Sensor (DEXCOM G6 SENSOR) MISC Use to check blood sugar four times daily. E11.42 3 each 3   Continuous Glucose Transmitter (DEXCOM G6 TRANSMITTER) MISC USE TO CHECK BLOOD SUGAR FOUR TIMES DAILY. E11.42 1 each 2   dorzolamide  (TRUSOPT ) 2 % ophthalmic solution Place 1 drop into the right eye 2 (two) times daily.     hydrALAZINE  (APRESOLINE ) 100 MG tablet Take 1 tablet (100 mg total) by mouth 3 (three) times daily. 270 tablet 2   insulin  lispro (HUMALOG   KWIKPEN) 100 UNIT/ML KwikPen INJECT 10 UNITS INTO THE SKIN 3 (THREE) TIMES DAILY. 15 mL 1   Insulin  Pen Needle (BD PEN NEEDLE NANO 2ND GEN) 32G X 4 MM MISC USE AS DIRECTED 3 TIMES A DAY 100 each 6   isosorbide  mononitrate (IMDUR ) 120 MG 24 hr tablet TAKE 1 TABLET BY MOUTH EVERY DAY 90 tablet 1   Lancets MISC Use as directed.  Accu chek 2 100 each 11   latanoprost  (XALATAN ) 0.005 % ophthalmic solution Place 1 drop into the right eye at bedtime.     lisinopril  (ZESTRIL ) 20 MG tablet Take 20 mg by mouth daily.     Multiple Vitamins-Minerals (MULTIVITAMIN WITH MINERALS) tablet Take 1 tablet by mouth daily.     ondansetron  (ZOFRAN ) 4 MG tablet TAKE 1 TABLET (4 MG TOTAL) BY MOUTH DAILY AS NEEDED FOR NAUSEA OR VOMITING (AS NEEDED FOR NAUSEA). 30 tablet 2   RHOPRESSA  0.02 % SOLN Place 1 drop into the right eye at bedtime.     timolol  (TIMOPTIC ) 0.5 % ophthalmic solution Place 1 drop into the right eye 2 (two) times daily.     traZODone  (DESYREL ) 50 MG tablet TAKE 1/2 TABLET BY MOUTH EVERY DAY AT BEDTIME  45 tablet 0   No current facility-administered medications on file prior to visit.    Allergies  Allergen Reactions   Empagliflozin Other (See Comments)    Four gangrene    Social History   Socioeconomic History   Marital status: Single    Spouse name: Not on file   Number of children: Not on file   Years of education: Not on file   Highest education level: Not on file  Occupational History   Occupation: Janitor    Employer: [employer]  Tobacco Use   Smoking status: Never   Smokeless tobacco: Never  Vaping Use   Vaping status: Never Used  Substance and Sexual Activity   Alcohol use: No   Drug use: No   Sexual activity: Never  Other Topics Concern   Not on file  Social History Narrative   Works at [employer]   [phone] Susie Batta   Social Drivers of Health   Financial Resource Strain: Low Risk  (11/07/2022)   Overall Financial Resource Strain (CARDIA)    Difficulty of Paying Living Expenses: Not hard at all  Food Insecurity: No Food Insecurity (11/07/2022)   Hunger Vital Sign    Worried About Running Out of Food in the Last Year: Never true    Ran Out of Food in the Last Year: Never true  Transportation Needs: No Transportation Needs (11/07/2022)   PRAPARE - Administrator, Civil Service (Medical): No    Lack of Transportation (Non-Medical): No  Physical Activity: Inactive (11/07/2022)   Exercise Vital Sign    Days of Exercise per Week: 0 days    Minutes of Exercise per Session: 0 min  Stress: No Stress Concern Present (11/07/2022)   [employer] of Occupational Health - Occupational Stress Questionnaire    Feeling of Stress : Not at all  Social Connections: Socially Isolated (11/07/2022)   Social Connection and Isolation Panel    Frequency of Communication with Friends and Family: More than three times a week    Frequency of Social Gatherings with Friends and Family: Once a week    Attends Religious Services: Never    Database administrator or  Organizations: No    Attends Banker Meetings: Never    Marital  Status: Never married  Intimate Partner Violence: Not At Risk (05/05/2021)   Humiliation, Afraid, Rape, and Kick questionnaire    Fear of Current or Ex-Partner: No    Emotionally Abused: No    Physically Abused: No    Sexually Abused: No    Family History  Problem Relation Age of Onset   Coronary artery disease Father        Developed in his 38s   Kidney disease Father    Diabetes Father    Melanoma Father    Rectal cancer Father    Heart failure Mother    Hypertension Mother    Diabetes Sister    Diabetes Brother    Colon cancer Neg Hx    Esophageal cancer Neg Hx    Stomach cancer Neg Hx     Past Surgical History:  Procedure Laterality Date   CATARACT EXTRACTION Right 2019   Dr. Octavia   CHOLECYSTECTOMY N/A 09/21/2017   Procedure: LAPAROSCOPIC CHOLECYSTECTOMY WITH INTRAOPERATIVE CHOLANGIOGRAM;  Surgeon: Sheldon Standing, MD;  Location: [facility];  Service: General;  Laterality: N/A;   COLONOSCOPY  2000   Dr. Kristie   EYE SURGERY     IRRIGATION AND DEBRIDEMENT ABSCESS Left 05/05/2014   Procedure: IRRIGATION AND DEBRIDEMENT ABSCESS left buttock;  Surgeon: Standing Sheldon, MD;  Location: [facility];  Service: General;  Laterality: Left;   LEFT HEART CATHETERIZATION WITH CORONARY ANGIOGRAM N/A 07/18/2011   Procedure: LEFT HEART CATHETERIZATION WITH CORONARY ANGIOGRAM;  Surgeon: Debby JONETTA Como, MD;  Location: [facility] CATH LAB;  Service: Cardiovascular;  Laterality: N/A;   PERCUTANEOUS CORONARY STENT INTERVENTION (PCI-S)  07/18/2011   Procedure: PERCUTANEOUS CORONARY STENT INTERVENTION (PCI-S);  Surgeon: Debby JONETTA Como, MD;  Location: [facility] CATH LAB;  Service: Cardiovascular;;    ROS: Review of Systems Negative except as stated above  PHYSICAL EXAM: BP (!) 171/80   Pulse (!) 54   Temp 97.8 F (36.6 C) (Oral)   Ht 5' 10 (1.778 m)   Wt 226 lb (102.5 kg)   SpO2 98%   BMI 32.43 kg/m   Physical Exam  General  appearance - alert  and pleasant malodorous overweight white male in no distress. Clothing appears to be soiled and unkempt. Brought walker today and uses it for ambulating, did not require it to get onto the exam table. Mental status - normal mood, behavior, speech, dress, motor activity, and thought processes Chest - clear to auscultation, no wheezes, rales or rhonchi, symmetric air entry Heart - normal rate, regular rhythm, normal S1, S2, no murmurs, rubs, clicks or gallops Extremities - dry flaky skin at B/l LE. 2-3+ pitting edema in b/l LE up to mid calf. Thick discolored toenails.  Diabetic foot exam was performed with the following findings:   Intact posterior tibialis and dorsalis pedis pulses Absent sensation to monofilament in bilateral toes and dorsal feet, extending up to the mid calf. Sensation to monofilament intact at the posterior plantar surfaces. Toenails are thickened, yellow, and discolored. Dry, flaky skin present on the feet and legs. No ulcers or skin breakdown            Latest Ref Rng & Units 12/16/2023   10:08 AM 10/24/2022    3:45 PM 01/10/2022   10:14 AM  CMP  Glucose 70 - 99 mg/dL 898   850   BUN 6 - 23 mg/dL 60   49   Creatinine 9.59 - 1.50 mg/dL 7.52   8.13   Sodium 864 - 145 mEq/L 144  145   Potassium 3.5 - 5.1 mEq/L 4.6   3.9   Chloride 96 - 112 mEq/L 110   103   CO2 19 - 32 mEq/L 25   31   Calcium  8.4 - 10.5 mg/dL 8.9   9.1   Total Protein 6.0 - 8.3 g/dL 6.5  6.1    Total Bilirubin 0.2 - 1.2 mg/dL 0.6  0.4    Alkaline Phos 39 - 117 U/L 47  66    AST 0 - 37 U/L 23  24    ALT 0 - 53 U/L 23  22     Lipid Panel     Component Value Date/Time   CHOL 98 (L) 10/24/2022 1545   TRIG 91 10/24/2022 1545   HDL 35 (L) 10/24/2022 1545   CHOLHDL 2.8 10/24/2022 1545   CHOLHDL 2.9 09/08/2021 0500   VLDL 8 09/08/2021 0500   LDLCALC 45 10/24/2022 1545    CBC    Component Value Date/Time   WBC 7.3 12/16/2023 1008   RBC 3.65 (L) 12/16/2023 1008   HGB 11.3  (L) 12/16/2023 1008   HGB 10.3 (L) 10/18/2021 1154   HCT 33.4 (L) 12/16/2023 1008   HCT 30.7 (L) 10/18/2021 1154   PLT 146.0 (L) 12/16/2023 1008   PLT 150 10/18/2021 1154   MCV 91.3 12/16/2023 1008   MCV 86 10/18/2021 1154   MCH 28.9 10/18/2021 1154   MCH 28.2 09/14/2021 0315   MCHC 33.7 12/16/2023 1008   RDW 13.7 12/16/2023 1008   RDW 14.5 10/18/2021 1154   LYMPHSABS 1.8 12/16/2023 1008   LYMPHSABS 1.8 07/12/2019 1042   MONOABS 0.9 12/16/2023 1008   EOSABS 0.4 12/16/2023 1008   EOSABS 0.3 07/12/2019 1042   BASOSABS 0.1 12/16/2023 1008   BASOSABS 0.1 07/12/2019 1042   Results for orders placed or performed in visit on 05/09/24  POCT glucose (manual entry)   Collection Time: 05/09/24 11:21 AM  Result Value Ref Range   POC Glucose 132 (A) 70 - 99 mg/dl  POCT glycosylated hemoglobin (Hb A1C)   Collection Time: 05/09/24 11:24 AM  Result Value Ref Range   Hemoglobin A1C     HbA1c POC (<> result, manual entry)     HbA1c, POC (prediabetic range)     HbA1c, POC (controlled diabetic range) 5.6 0.0 - 7.0 %     ASSESSMENT AND PLAN:  Assessment and Plan  1. Type 2 diabetes mellitus with diabetic polyneuropathy, with long-term current use of insulin  (HCC) (Primary) At goal. A1c 5.6 with good glucose measurements at home. Based on a few hypoglycemic episodes and history of CKD4, will decrease Lantus  to 9 units daily. Continue Humalog  10 U TID with meals PRN. Discussed and commended on healthier eating habits.  - Lantus  9 U in PM - Continue Humalog  10 U TID with meals PRN - POCT glucose (manual entry) - POCT glycosylated hemoglobin (Hb A1C) - insulin  glargine (LANTUS ) 100 UNIT/ML injection; Take subcutaneous 9 U every day at bedtime  Dispense: 10 mL; Refill: 6  2. Hypertension associated with diabetes (HCC) Not at goal. Possibly elevated due to his morning caffeine intake and recent increased salt intake this week. Considering his multiple comorbidities, lower HR, and pt reporting  good home readings and a good BP reading at his recent nephrology visit, will not make medication changes at this time.  - Patient advised to check blood pressure once weekly at home, write numbers down, and bring to f/u.  - Encouraged him to limit  salt in diet and continue limiting carbs and sugary drinks.  - Continue coreg  12.5 mg BID, hydralazine  100 mg TID, lisinopril  20 mg daily, isosorbide  120 mg daily  3. Hyperlipidemia associated with type 2 diabetes mellitus (HCC) Continue atorvastatin .  4. Chronic combined systolic and diastolic congestive heart failure (HCC) Recent increased leg edema possibly due to CHF. Lungs clear. Discussed limiting use of salt in the diet which can worsen swelling. Will increase AM torseminde dose for a couple of days.  - Encouraged him to reschedule his repeat Echo - I have written the prescription for torsemide  as 40 mg in the morning and 20 mg in the evening for the next two days to decrease swelling, then he may return to 20 mg BID  - Continue Coreg , Lipitor, ASA and Isosorbide  as above  - torsemide  (DEMADEX ) 20 MG tablet; 1 tab PO twice a day. Take an extra tab in a.m for 2 days when there is increase swelling in the legs.  Dispense: 200 tablet; Refill: 1  5. Anemia due to stage 4 chronic kidney disease (HCC) Stable.  6. CKD (chronic kidney disease) stage 4, GFR 15-29 ml/min (HCC) Stable. GFR 26 in the last year. Followed by nephrologist Dr. Norine and still waiting on records from 05/04/2024 to be sent over. Continue to avoid NSAIDs.  7. Influenza vaccination declined  Patient was given the opportunity to ask questions.  Patient verbalized understanding of the plan and was able to repeat key elements of the plan.   Orders Placed This Encounter  Procedures   POCT glucose (manual entry)   POCT glycosylated hemoglobin (Hb A1C)     Requested Prescriptions   Signed Prescriptions Disp Refills   insulin  glargine (LANTUS ) 100 UNIT/ML injection 10 mL 6     Sig: Take subcutaneous 9 U every day at bedtime   torsemide  (DEMADEX ) 20 MG tablet 200 tablet 1    Sig: 1 tab PO twice a day. Take an extra tab in a.m for 2 days when there is increase swelling in the legs.    Return in about 4 months (around 09/08/2024).  This is a Psychologist, occupational Note.  The care of the patient was discussed with Dr. Vicci and the assessment and plan formulated with her assistance.  Please see her attestation of this encounter.  Duwaine Amber, MS3 [employer]  Evaluation and management procedures were performed by me with Medical Student in attendance, note written by Medical Student under my supervision and collaboration. I have reviewed the note and I agree with the management and plan.   Barnie Vicci, MD, FAAFP. [facility] [place], [place] [phone]   05/09/2024, 10:34 PM

## 2024-05-09 NOTE — Patient Instructions (Signed)
 When there is increased swelling in the legs, please increase torsemide  to 40 mg (2 tablets) in the morning for two days, and take 20 mg (1 tablet) in the evening you can go back to 20 mg twice a day We have decreased lantus  to 9 U a day.  Check blood pressure once a week at home and write it down. Continue to take your blood pressure meds. Soak your feet in water and apply lotion, they look very dry  Please call and schedule the appointment for your echocardiogram with the heart doctors.

## 2024-05-19 DIAGNOSIS — E113511 Type 2 diabetes mellitus with proliferative diabetic retinopathy with macular edema, right eye: Secondary | ICD-10-CM | POA: Diagnosis not present

## 2024-06-16 ENCOUNTER — Telehealth: Payer: Self-pay | Admitting: Internal Medicine

## 2024-06-16 NOTE — Telephone Encounter (Signed)
 Copied from CRM [id number]. Topic: General - Other >> Jun 16, 2024  1:01 PM Joesph B wrote:  Reason for CRM: [facility] is calling to confirm the patient has a chronic condition. Patient applied for a chronic condition special needs plan. They will be faxing over paperwork.   PH: [phone].

## 2024-06-20 DIAGNOSIS — E113511 Type 2 diabetes mellitus with proliferative diabetic retinopathy with macular edema, right eye: Secondary | ICD-10-CM | POA: Diagnosis not present

## 2024-06-22 NOTE — Telephone Encounter (Signed)
 Fax was not located in previous stack of forms. Called [facility] and spoke to [place]. Requested for form to be re-faxed to our office as it has not been previously received. Awaiting fax.

## 2024-06-22 NOTE — Telephone Encounter (Signed)
 I do not have it. See if it was in the folder that I left on your desk with forms from last wk.

## 2024-06-26 ENCOUNTER — Other Ambulatory Visit: Payer: Self-pay | Admitting: Internal Medicine

## 2024-06-26 DIAGNOSIS — R110 Nausea: Secondary | ICD-10-CM

## 2024-06-27 NOTE — Telephone Encounter (Signed)
 Requested medications are due for refill today.  yes  Requested medications are on the active medications list.  yes  Last refill. 03/30/2024 #30 2 rf  Future visit scheduled.   yes  Notes to clinic.  Refill not delegated.    Requested Prescriptions  Pending Prescriptions Disp Refills   ondansetron  (ZOFRAN ) 4 MG tablet [Pharmacy Med Name: ONDANSETRON  HCL 4 MG TABLET] 30 tablet 2    Sig: TAKE 1 TABLET (4 MG TOTAL) BY MOUTH DAILY AS NEEDED FOR NAUSEA OR VOMITING (AS NEEDED FOR NAUSEA).     Not Delegated - Gastroenterology: Antiemetics - ondansetron  Failed - 06/27/2024  5:57 PM      Failed - This refill cannot be delegated      Passed - AST in normal range and within 360 days    AST  Date Value Ref Range Status  12/16/2023 23 0 - 37 U/L Final         Passed - ALT in normal range and within 360 days    ALT  Date Value Ref Range Status  12/16/2023 23 0 - 53 U/L Final         Passed - Valid encounter within last 6 months    Recent Outpatient Visits           1 month ago Type 2 diabetes mellitus with diabetic polyneuropathy, with long-term current use of insulin  (HCC)   [facility] [place]. [facility] Vicci Sober B, MD   5 months ago Type 2 diabetes mellitus with diabetic polyneuropathy, with long-term current use of insulin  [facility])   [facility] [place]. [facility] Vicci Sober B, MD   1 year ago Type 2 diabetes mellitus with diabetic polyneuropathy, with long-term current use of insulin  [facility])   [facility] [place]. [facility] Vicci Sober B, MD   1 year ago Type 2 diabetes mellitus with diabetic polyneuropathy, with long-term current use of insulin  [facility])   [facility] [place]. [facility] Vicci Sober B, MD   2 years ago Type 2 diabetes mellitus with diabetic polyneuropathy, with long-term  current use of insulin  [facility])   [facility] [place]. [facility] Vicci Sober NOVAK, MD       Future Appointments             In 1 week Lavona Agent, MD [facility] Dept of [employer]. [facility] [employer], [employer]

## 2024-06-29 NOTE — Telephone Encounter (Signed)
 I do have form. Will leave on your desk.  You should verify with pt that he is with or will be going with [facility]. I see [facility] on his chart.

## 2024-06-29 NOTE — Telephone Encounter (Signed)
 Copied from CRM [id number]. Topic: General - Other >> Jun 29, 2024  1:30 PM Victoria B wrote:  Reason for CRM: [facility] needing to now what chronic illness the patient has to quality with them

## 2024-06-29 NOTE — Telephone Encounter (Signed)
 Called but no answer. LVM to call back.

## 2024-06-30 ENCOUNTER — Telehealth: Payer: Self-pay

## 2024-06-30 NOTE — Telephone Encounter (Signed)
 Called but no answer. LVM to call back.

## 2024-06-30 NOTE — Telephone Encounter (Signed)
 Copied from CRM [id number]. Topic: Clinical - Medical Advice >> Jun 30, 2024 10:21 AM Nathanel BROCKS wrote: Reason for CRM: [facility], Amy, [phone] option 1 ref case [id number]  They need to know if this patient has any chronic health conditions and if so what are they?

## 2024-06-30 NOTE — Telephone Encounter (Signed)
 Duplicate

## 2024-07-01 ENCOUNTER — Other Ambulatory Visit: Payer: Self-pay | Admitting: Internal Medicine

## 2024-07-01 DIAGNOSIS — I25118 Atherosclerotic heart disease of native coronary artery with other forms of angina pectoris: Secondary | ICD-10-CM

## 2024-07-01 DIAGNOSIS — I152 Hypertension secondary to endocrine disorders: Secondary | ICD-10-CM

## 2024-07-01 NOTE — Telephone Encounter (Signed)
 Called but no answer. LVM to call back.

## 2024-07-04 ENCOUNTER — Ambulatory Visit (HOSPITAL_COMMUNITY)
Admission: RE | Admit: 2024-07-04 | Discharge: 2024-07-04 | Disposition: A | Discharge status: Home / Self Care | Source: Ambulatory Visit | Attending: Cardiology | Admitting: Cardiology

## 2024-07-04 DIAGNOSIS — I5020 Unspecified systolic (congestive) heart failure: Secondary | ICD-10-CM | POA: Diagnosis not present

## 2024-07-04 LAB — ECHOCARDIOGRAM COMPLETE
Area-P 1/2: 3.28 cm2
S' Lateral: 3.5 cm

## 2024-07-06 ENCOUNTER — Other Ambulatory Visit: Payer: Self-pay | Admitting: Internal Medicine

## 2024-07-06 DIAGNOSIS — I25118 Atherosclerotic heart disease of native coronary artery with other forms of angina pectoris: Secondary | ICD-10-CM

## 2024-07-06 DIAGNOSIS — I152 Hypertension secondary to endocrine disorders: Secondary | ICD-10-CM

## 2024-07-07 ENCOUNTER — Ambulatory Visit: Payer: Self-pay | Admitting: Cardiology

## 2024-07-07 DIAGNOSIS — R609 Edema, unspecified: Secondary | ICD-10-CM | POA: Insufficient documentation

## 2024-07-07 NOTE — Progress Notes (Signed)
 Cardiology Office Note:   Date:  07/08/2024  ID:  Derek Lowery, DOB 23-Dec-1955, MRN [id number] PCP: Vicci Barnie NOVAK, MD  [facility] Providers Cardiologist:  Lynwood Schilling, MD {  History of Present Illness:   Derek Lowery is a 68 y.o. male with a history of coronary artery disease.  I saw him because of an abnormal EKG. He has previously been seen by Dr. Morris in 2013.   He has a history of CAD.   EF was 60 - 65% on echo in February.    In 2012 he had cardiac catheterization with 60% diagonal stenosis, distal LAD 60% stenosis, OM 2 with an occluded superior branch and inferior 90% branch stenosis.  He had DES stenting to the OM 2 inferior branch.  His EF was well-preserved and he was managed medically otherwise.  He was not seen again until 2020 when he was seen prior to getting gallbladder surgery.  Was seen in the hospital.  He had right bundle branch block.  He had a normal echocardiogram.  There were no other significant abnormalities at that time. He was in with respiratory failure in May 2022 and again in January 2023 he had acute respiratory failure and was ventilatory dependent with pneumonia and pulmonary edema.  His EF was 30 to 35% with global hypokinesis.  At the last visit an echo was ordered but he was not aware of this and this was not done.     Since he was last seen he has had no acute cardiac complaints.  I am happy to see that the echocardiogram that he had done the other day demonstrates his EF now to be 50 to 55% which is improved.  He has no significant valvular abnormalities.  He gets around with a cane or walker.  He has been taking an extra torsemide  at the direction of his nephrologist because he had some increased lower extremity swelling.  He takes 20 mg times 2 in the morning and 20 mg in the afternoon. The patient denies any new symptoms such as chest discomfort, neck or arm discomfort. There has been no new shortness of breath, PND or orthopnea. There  have been no reported palpitations, presyncope or syncope.   ROS: As stated in the HPI and negative for all other systems.   Studies Reviewed:    EKG:     NA    Risk Assessment/Calculations:             Physical Exam:   VS:  BP 134/68   Pulse 60   Ht 5' 10 (1.778 m)   Wt 230 lb 9.6 oz (104.6 kg)   SpO2 93%   BMI 33.09 kg/m    Wt Readings from Last 3 Encounters:  07/08/24 230 lb 9.6 oz (104.6 kg)  05/09/24 226 lb (102.5 kg)  01/06/24 214 lb 6.4 oz (97.3 kg)     GEN: Well nourished, well developed in no acute distress NECK: No JVD; No carotid bruits CARDIAC: RRR, brief systolic murmur, no diastolic murmurs, rubs, gallops RESPIRATORY:  Clear to auscultation without rales, wheezing or rhonchi  ABDOMEN: Soft, non-tender, non-distended EXTREMITIES:  Left greater than right leg edema; No deformity   ASSESSMENT AND PLAN:   Bilateral lower extremity edema:     I am going to check a BMET.  For now he can continue the diuretic as he has been taking it.  He understands salt and fluid restriction.   Coronary artery disease:   He  has no ongoing symptoms.  No change in therapy.   Essential hypertension:   His blood pressure is at target.  No change in therapy.     Hyperlipidemia: LDL was LDL was 45.  HDL 35.  No change in therapy.    Diabetes mellitus: A1c was 5.6.  No change in therapy.    Cardiomyopathy: He does have a reduced ejection fraction Glad to see it is improved.  He has not really tolerated med titration and we had avoided ACE and ARB for the most part because of his renal insufficiency.  He was put on the low-dose ACE inhibitor by his nephrologist and I will defer to their management.  They are following his renal function closely and I will check a BMP today.   Follow up with Katlyn West in 6 months.   Signed, Lynwood Schilling, MD

## 2024-07-08 ENCOUNTER — Ambulatory Visit: Attending: Cardiology | Admitting: Cardiology

## 2024-07-08 ENCOUNTER — Encounter: Payer: Self-pay | Admitting: Cardiology

## 2024-07-08 VITALS — BP 134/68 | HR 60 | Ht 70.0 in | Wt 230.6 lb

## 2024-07-08 DIAGNOSIS — E785 Hyperlipidemia, unspecified: Secondary | ICD-10-CM

## 2024-07-08 DIAGNOSIS — R609 Edema, unspecified: Secondary | ICD-10-CM | POA: Diagnosis not present

## 2024-07-08 DIAGNOSIS — E118 Type 2 diabetes mellitus with unspecified complications: Secondary | ICD-10-CM

## 2024-07-08 DIAGNOSIS — I2510 Atherosclerotic heart disease of native coronary artery without angina pectoris: Secondary | ICD-10-CM | POA: Diagnosis not present

## 2024-07-08 DIAGNOSIS — I5020 Unspecified systolic (congestive) heart failure: Secondary | ICD-10-CM

## 2024-07-08 NOTE — Patient Instructions (Signed)
 Medication Instructions:  No Changes *If you need a refill on your cardiac medications before your next appointment, please call your pharmacy*  Lab Work: Today: BMET (to check kidneys and electrolytes)  If you have labs (blood work) drawn today and your tests are completely normal, you will receive your results only by: MyChart Message (if you have MyChart) OR A paper copy in the mail If you have any lab test that is abnormal or we need to change your treatment, we will call you to review the results.   Follow-Up: At [facility], you and your health needs are our priority.  As part of our continuing mission to provide you with exceptional heart care, our providers are all part of one team.  This team includes your primary Cardiologist (physician) and Advanced Practice Providers or APPs (Physician Assistants and Nurse Practitioners) who all work together to provide you with the care you need, when you need it.  Your next appointment:   6 month(s) (Call in Feb for a May 2026 appointment)  Provider:   Lynwood Schilling, MD    Other Instructions Please call us  (with urgent matters) or send a MyChart message with any Cardiology related questions/concerns.  [phone].  Thank you!

## 2024-07-09 LAB — BASIC METABOLIC PANEL WITH GFR
BUN/Creatinine Ratio: 24 (ref 10–24)
BUN: 68 mg/dL — AB (ref 8–27)
CO2: 25 mmol/L (ref 20–29)
Calcium: 8.7 mg/dL (ref 8.6–10.2)
Chloride: 106 mmol/L (ref 96–106)
Creatinine, Ser: 2.83 mg/dL — AB (ref 0.76–1.27)
Glucose: 101 mg/dL — ABNORMAL HIGH (ref 70–99)
Potassium: 4.9 mmol/L (ref 3.5–5.2)
Sodium: 143 mmol/L (ref 134–144)
eGFR: 24 mL/min/1.73 — AB (ref >59)

## 2024-07-10 ENCOUNTER — Ambulatory Visit: Payer: Self-pay | Admitting: Cardiology

## 2024-07-10 DIAGNOSIS — R7989 Other specified abnormal findings of blood chemistry: Secondary | ICD-10-CM

## 2024-07-10 DIAGNOSIS — I5042 Chronic combined systolic (congestive) and diastolic (congestive) heart failure: Secondary | ICD-10-CM

## 2024-07-21 ENCOUNTER — Telehealth: Payer: Self-pay | Admitting: Cardiology

## 2024-07-21 MED ORDER — TORSEMIDE 20 MG PO TABS: ORAL_TABLET | ORAL | Status: AC

## 2024-07-21 NOTE — Telephone Encounter (Signed)
 Pt was returning nurse call regarding results and requesting a callback. Please advise

## 2024-07-21 NOTE — Telephone Encounter (Signed)
 Lisinopril  discontinued. Torsemide  updated on med list. Results sent to PCP.

## 2024-07-21 NOTE — Telephone Encounter (Signed)
 See results follow up from 11/23

## 2024-08-06 ENCOUNTER — Other Ambulatory Visit: Payer: Self-pay | Admitting: Internal Medicine

## 2024-08-06 DIAGNOSIS — E118 Type 2 diabetes mellitus with unspecified complications: Secondary | ICD-10-CM

## 2024-08-11 ENCOUNTER — Other Ambulatory Visit: Payer: Self-pay | Admitting: Internal Medicine

## 2024-08-15 ENCOUNTER — Encounter: Payer: Self-pay | Admitting: Internal Medicine

## 2024-08-15 NOTE — Telephone Encounter (Signed)
 Called & spoke to [place] B at [facility]. Requested for the Chronic Disease Verification form to be faxed to [facility]. Lyndy confirmed that it will be faxed today 08/15/24. Awaiting fax.

## 2024-08-15 NOTE — Telephone Encounter (Signed)
 Fax received from [facility], form placed in provider's box.

## 2024-08-15 NOTE — Telephone Encounter (Signed)
 Copied from CRM #[id number]. Topic: General - Other >> Aug 15, 2024  2:36 PM Alexandria E wrote:  Reason for CRM: Patient stated he will be switching his insurance company to [employer], stated they faxed over a form that PCP has to fill out in order for patient to receive his food card. Patient is needing this completed before 08/18/2024.

## 2024-08-25 ENCOUNTER — Other Ambulatory Visit: Payer: Self-pay | Admitting: Internal Medicine

## 2024-08-25 DIAGNOSIS — E1142 Type 2 diabetes mellitus with diabetic polyneuropathy: Secondary | ICD-10-CM

## 2024-08-25 NOTE — Telephone Encounter (Signed)
 Requested Prescriptions  Pending Prescriptions Disp Refills   Continuous Glucose Transmitter (DEXCOM G6 TRANSMITTER) MISC [Pharmacy Med Name: DEXCOM G6 TRANSMITTER] 1 each 0    Sig: USE TO CHECK BLOOD SUGAR FOUR TIMES DAILY. E11.42     Endocrinology: Diabetes - Testing Supplies Passed - 08/25/2024  3:17 PM      Passed - Valid encounter within last 12 months    Recent Outpatient Visits           3 months ago Type 2 diabetes mellitus with diabetic polyneuropathy, with long-term current use of insulin  (HCC)   [facility] [place] Vicci Sober B, MD   7 months ago Type 2 diabetes mellitus with diabetic polyneuropathy, with long-term current use of insulin  [facility])   [facility] [place] Vicci Sober B, MD   1 year ago Type 2 diabetes mellitus with diabetic polyneuropathy, with long-term current use of insulin  [facility])   [facility] [place] Vicci Sober B, MD   1 year ago Type 2 diabetes mellitus with diabetic polyneuropathy, with long-term current use of insulin  [facility])   [facility] [place]. [facility] Vicci Sober B, MD   2 years ago Type 2 diabetes mellitus with diabetic polyneuropathy, with long-term current use of insulin  [facility])   [facility] [place]. [facility] Vicci Sober NOVAK, MD

## 2024-09-07 ENCOUNTER — Other Ambulatory Visit: Payer: Self-pay | Admitting: Internal Medicine

## 2024-09-07 DIAGNOSIS — E118 Type 2 diabetes mellitus with unspecified complications: Secondary | ICD-10-CM

## 2024-09-08 ENCOUNTER — Ambulatory Visit: Attending: Internal Medicine | Admitting: Internal Medicine

## 2024-09-08 ENCOUNTER — Telehealth: Payer: Self-pay | Admitting: Internal Medicine

## 2024-09-08 NOTE — Telephone Encounter (Signed)
 Contacted patient, no answer. Left voicemail requesting patient to call back.

## 2024-10-06 ENCOUNTER — Ambulatory Visit: Payer: Self-pay | Admitting: Internal Medicine
